# Patient Record
Sex: Male | Born: 1935
Health system: Southern US, Community
[De-identification: ages and names within clinical notes are randomized; demographics above are authoritative.]

## PROBLEM LIST (undated history)

## (undated) DIAGNOSIS — I2510 Atherosclerotic heart disease of native coronary artery without angina pectoris: Secondary | ICD-10-CM

## (undated) DIAGNOSIS — C229 Malignant neoplasm of liver, not specified as primary or secondary: Secondary | ICD-10-CM

## (undated) DIAGNOSIS — Z8546 Personal history of malignant neoplasm of prostate: Secondary | ICD-10-CM

## (undated) DIAGNOSIS — R319 Hematuria, unspecified: Secondary | ICD-10-CM

## (undated) DIAGNOSIS — E119 Type 2 diabetes mellitus without complications: Secondary | ICD-10-CM

## (undated) DIAGNOSIS — E785 Hyperlipidemia, unspecified: Secondary | ICD-10-CM

## (undated) DIAGNOSIS — N189 Chronic kidney disease, unspecified: Secondary | ICD-10-CM

## (undated) DIAGNOSIS — C801 Malignant (primary) neoplasm, unspecified: Secondary | ICD-10-CM

## (undated) DIAGNOSIS — I499 Cardiac arrhythmia, unspecified: Secondary | ICD-10-CM

## (undated) DIAGNOSIS — C61 Malignant neoplasm of prostate: Secondary | ICD-10-CM

## (undated) DIAGNOSIS — Z960 Presence of urogenital implants: Secondary | ICD-10-CM

## (undated) DIAGNOSIS — I10 Essential (primary) hypertension: Secondary | ICD-10-CM

## (undated) DIAGNOSIS — R011 Cardiac murmur, unspecified: Secondary | ICD-10-CM

## (undated) DIAGNOSIS — G4733 Obstructive sleep apnea (adult) (pediatric): Secondary | ICD-10-CM

## (undated) DIAGNOSIS — R55 Syncope and collapse: Secondary | ICD-10-CM

## (undated) DIAGNOSIS — M1990 Unspecified osteoarthritis, unspecified site: Secondary | ICD-10-CM

## (undated) DIAGNOSIS — L0390 Cellulitis, unspecified: Secondary | ICD-10-CM

## (undated) DIAGNOSIS — C8590 Non-Hodgkin lymphoma, unspecified, unspecified site: Secondary | ICD-10-CM

## (undated) DIAGNOSIS — S2249XA Multiple fractures of ribs, unspecified side, initial encounter for closed fracture: Secondary | ICD-10-CM

## (undated) DIAGNOSIS — M109 Gout, unspecified: Secondary | ICD-10-CM

## (undated) DIAGNOSIS — G4730 Sleep apnea, unspecified: Secondary | ICD-10-CM

## (undated) DIAGNOSIS — N200 Calculus of kidney: Secondary | ICD-10-CM

## (undated) DIAGNOSIS — E1140 Type 2 diabetes mellitus with diabetic neuropathy, unspecified: Secondary | ICD-10-CM

## (undated) DIAGNOSIS — W309XXA Contact with unspecified agricultural machinery, initial encounter: Secondary | ICD-10-CM

## (undated) DIAGNOSIS — I4891 Unspecified atrial fibrillation: Secondary | ICD-10-CM

## (undated) DIAGNOSIS — I480 Paroxysmal atrial fibrillation: Secondary | ICD-10-CM

## (undated) DIAGNOSIS — R413 Other amnesia: Secondary | ICD-10-CM

## (undated) DIAGNOSIS — J45909 Unspecified asthma, uncomplicated: Secondary | ICD-10-CM

## (undated) DIAGNOSIS — R0602 Shortness of breath: Secondary | ICD-10-CM

## (undated) DIAGNOSIS — Z87442 Personal history of urinary calculi: Secondary | ICD-10-CM

## (undated) DIAGNOSIS — R7881 Bacteremia: Secondary | ICD-10-CM

## (undated) DIAGNOSIS — D649 Anemia, unspecified: Secondary | ICD-10-CM

## (undated) DIAGNOSIS — H9190 Unspecified hearing loss, unspecified ear: Secondary | ICD-10-CM

## (undated) DIAGNOSIS — C4490 Unspecified malignant neoplasm of skin, unspecified: Secondary | ICD-10-CM

## (undated) DIAGNOSIS — K219 Gastro-esophageal reflux disease without esophagitis: Secondary | ICD-10-CM

## (undated) DIAGNOSIS — G629 Polyneuropathy, unspecified: Secondary | ICD-10-CM

## (undated) HISTORY — DX: Syncope and collapse: R55

## (undated) HISTORY — DX: Unspecified hearing loss, unspecified ear: H91.90

## (undated) HISTORY — DX: Unspecified atrial fibrillation: I48.91

## (undated) HISTORY — DX: Anemia, unspecified: D64.9

## (undated) HISTORY — PX: CYSTOSCOPY W/ URETERAL STENT PLACEMENT: SHX1429

## (undated) HISTORY — DX: Gastro-esophageal reflux disease without esophagitis: K21.9

## (undated) HISTORY — DX: Other amnesia: R41.3

## (undated) HISTORY — PX: CYSTOSCOPY: SUR368

## (undated) HISTORY — DX: Unspecified osteoarthritis, unspecified site: M19.90

## (undated) HISTORY — PX: KIDNEY STONE SURGERY: SHX686

## (undated) HISTORY — PX: PROSTATECTOMY: SHX69

## (undated) HISTORY — PX: TRANSURETHRAL RESECTION OF PROSTATE: SHX73

## (undated) HISTORY — PX: CARDIAC CATHETERIZATION: SHX172

## (undated) HISTORY — PX: ROTATOR CUFF REPAIR: SHX139

## (undated) HISTORY — PX: ANKLE SURGERY: SHX546

## (undated) HISTORY — PX: COLONOSCOPY: SHX174

## (undated) HISTORY — PX: CHOLECYSTECTOMY: SHX55

---
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
## 1996-09-16 HISTORY — PX: CARDIAC CATHETERIZATION: SHX172

## 1999-03-24 ENCOUNTER — Other Ambulatory Visit: Admission: RE | Admit: 1999-03-24 | Discharge: 1999-03-24 | Payer: Self-pay | Admitting: Urology

## 1999-04-26 HISTORY — PX: PROSTATECTOMY: SHX69

## 1999-04-30 ENCOUNTER — Encounter: Payer: Self-pay | Admitting: Urology

## 1999-05-05 ENCOUNTER — Encounter (INDEPENDENT_AMBULATORY_CARE_PROVIDER_SITE_OTHER): Payer: Self-pay | Admitting: Specialist

## 1999-05-05 ENCOUNTER — Inpatient Hospital Stay (HOSPITAL_COMMUNITY): Admission: RE | Admit: 1999-05-05 | Discharge: 1999-05-08 | Payer: Self-pay | Admitting: Urology

## 1999-05-11 ENCOUNTER — Emergency Department (HOSPITAL_COMMUNITY): Admission: EM | Admit: 1999-05-11 | Discharge: 1999-05-11 | Payer: Self-pay | Admitting: Emergency Medicine

## 2000-03-21 ENCOUNTER — Encounter: Payer: Self-pay | Admitting: Internal Medicine

## 2000-03-21 ENCOUNTER — Ambulatory Visit (HOSPITAL_COMMUNITY): Admission: RE | Admit: 2000-03-21 | Discharge: 2000-03-21 | Payer: Self-pay | Admitting: Internal Medicine

## 2002-05-05 ENCOUNTER — Encounter: Admission: RE | Admit: 2002-05-05 | Discharge: 2002-05-05 | Payer: Self-pay | Admitting: Orthopaedic Surgery

## 2002-05-05 ENCOUNTER — Encounter: Payer: Self-pay | Admitting: Orthopaedic Surgery

## 2002-05-28 ENCOUNTER — Encounter: Payer: Self-pay | Admitting: Orthopaedic Surgery

## 2002-05-28 ENCOUNTER — Encounter: Admission: RE | Admit: 2002-05-28 | Discharge: 2002-05-28 | Payer: Self-pay | Admitting: Orthopaedic Surgery

## 2002-06-13 ENCOUNTER — Ambulatory Visit (HOSPITAL_BASED_OUTPATIENT_CLINIC_OR_DEPARTMENT_OTHER): Admission: RE | Admit: 2002-06-13 | Discharge: 2002-06-14 | Payer: Self-pay | Admitting: Orthopaedic Surgery

## 2003-07-29 ENCOUNTER — Emergency Department (HOSPITAL_COMMUNITY): Admission: EM | Admit: 2003-07-29 | Discharge: 2003-07-29 | Payer: Self-pay | Admitting: Emergency Medicine

## 2003-07-31 ENCOUNTER — Ambulatory Visit (HOSPITAL_COMMUNITY): Admission: RE | Admit: 2003-07-31 | Discharge: 2003-07-31 | Payer: Self-pay | Admitting: Internal Medicine

## 2003-08-01 ENCOUNTER — Ambulatory Visit (HOSPITAL_COMMUNITY): Admission: RE | Admit: 2003-08-01 | Discharge: 2003-08-01 | Payer: Self-pay | Admitting: Surgery

## 2003-08-01 ENCOUNTER — Encounter (INDEPENDENT_AMBULATORY_CARE_PROVIDER_SITE_OTHER): Payer: Self-pay | Admitting: *Deleted

## 2003-08-01 ENCOUNTER — Encounter (INDEPENDENT_AMBULATORY_CARE_PROVIDER_SITE_OTHER): Payer: Self-pay | Admitting: Specialist

## 2004-05-18 ENCOUNTER — Ambulatory Visit (HOSPITAL_COMMUNITY): Admission: RE | Admit: 2004-05-18 | Discharge: 2004-05-18 | Payer: Self-pay | Admitting: Gastroenterology

## 2004-06-11 ENCOUNTER — Encounter (INDEPENDENT_AMBULATORY_CARE_PROVIDER_SITE_OTHER): Payer: Self-pay | Admitting: Specialist

## 2004-06-11 ENCOUNTER — Ambulatory Visit (HOSPITAL_COMMUNITY): Admission: RE | Admit: 2004-06-11 | Discharge: 2004-06-11 | Payer: Self-pay | Admitting: Gastroenterology

## 2004-06-28 ENCOUNTER — Ambulatory Visit: Payer: Self-pay | Admitting: Oncology

## 2004-06-30 ENCOUNTER — Ambulatory Visit (HOSPITAL_COMMUNITY): Admission: RE | Admit: 2004-06-30 | Discharge: 2004-06-30 | Payer: Self-pay | Admitting: Oncology

## 2004-07-03 ENCOUNTER — Ambulatory Visit (HOSPITAL_COMMUNITY): Admission: RE | Admit: 2004-07-03 | Discharge: 2004-07-03 | Payer: Self-pay | Admitting: Oncology

## 2004-07-06 ENCOUNTER — Encounter (HOSPITAL_COMMUNITY): Admission: RE | Admit: 2004-07-06 | Discharge: 2004-10-04 | Payer: Self-pay | Admitting: Oncology

## 2004-10-06 ENCOUNTER — Ambulatory Visit (HOSPITAL_COMMUNITY): Admission: RE | Admit: 2004-10-06 | Discharge: 2004-10-06 | Payer: Self-pay | Admitting: Oncology

## 2004-10-08 ENCOUNTER — Ambulatory Visit: Payer: Self-pay | Admitting: Oncology

## 2004-11-23 ENCOUNTER — Ambulatory Visit: Payer: Self-pay | Admitting: Oncology

## 2005-01-11 ENCOUNTER — Ambulatory Visit: Payer: Self-pay | Admitting: Oncology

## 2005-03-08 ENCOUNTER — Ambulatory Visit: Payer: Self-pay | Admitting: Oncology

## 2005-05-09 ENCOUNTER — Ambulatory Visit: Payer: Self-pay | Admitting: Oncology

## 2005-05-10 ENCOUNTER — Ambulatory Visit (HOSPITAL_COMMUNITY): Admission: RE | Admit: 2005-05-10 | Discharge: 2005-05-10 | Payer: Self-pay | Admitting: Oncology

## 2005-06-30 ENCOUNTER — Ambulatory Visit: Payer: Self-pay | Admitting: Oncology

## 2005-07-24 ENCOUNTER — Ambulatory Visit: Payer: Self-pay | Admitting: Oncology

## 2005-07-26 LAB — CBC WITH DIFFERENTIAL/PLATELET
Basophils Absolute: 0.1 10*3/uL (ref 0.0–0.1)
EOS%: 2.5 % (ref 0.0–7.0)
HGB: 11.4 g/dL — ABNORMAL LOW (ref 13.0–17.1)
LYMPH%: 9.9 % — ABNORMAL LOW (ref 14.0–48.0)
MCH: 25.8 pg — ABNORMAL LOW (ref 28.0–33.4)
MCV: 77 fL — ABNORMAL LOW (ref 81.6–98.0)
MONO%: 11.4 % (ref 0.0–13.0)
Platelets: 344 10*3/uL (ref 145–400)
RBC: 4.43 10*6/uL (ref 4.20–5.71)
RDW: 18.6 % — ABNORMAL HIGH (ref 11.2–14.6)

## 2005-07-26 LAB — COMPREHENSIVE METABOLIC PANEL
AST: 24 U/L (ref 0–37)
Albumin: 2.8 g/dL — ABNORMAL LOW (ref 3.5–5.2)
Alkaline Phosphatase: 78 U/L (ref 39–117)
BUN: 13 mg/dL (ref 6–23)
Potassium: 3.5 mEq/L (ref 3.5–5.3)
Total Bilirubin: 0.7 mg/dL (ref 0.3–1.2)

## 2005-08-02 LAB — COMPREHENSIVE METABOLIC PANEL
AST: 29 U/L (ref 0–37)
Albumin: 3.1 g/dL — ABNORMAL LOW (ref 3.5–5.2)
BUN: 13 mg/dL (ref 6–23)
CO2: 27 mEq/L (ref 19–32)
Calcium: 11.1 mg/dL — ABNORMAL HIGH (ref 8.4–10.5)
Chloride: 101 mEq/L (ref 96–112)
Creatinine, Ser: 0.8 mg/dL (ref 0.4–1.5)
Glucose, Bld: 90 mg/dL (ref 70–99)
Potassium: 3.9 mEq/L (ref 3.5–5.3)

## 2005-08-02 LAB — CBC WITH DIFFERENTIAL/PLATELET
Basophils Absolute: 0 10*3/uL (ref 0.0–0.1)
EOS%: 1.6 % (ref 0.0–7.0)
HCT: 34 % — ABNORMAL LOW (ref 38.7–49.9)
HGB: 11.2 g/dL — ABNORMAL LOW (ref 13.0–17.1)
MONO#: 0.7 10*3/uL (ref 0.1–0.9)
NEUT#: 10.3 10*3/uL — ABNORMAL HIGH (ref 1.5–6.5)
NEUT%: 85.8 % — ABNORMAL HIGH (ref 40.0–75.0)
RDW: 18.7 % — ABNORMAL HIGH (ref 11.2–14.6)
WBC: 11.9 10*3/uL — ABNORMAL HIGH (ref 4.0–10.0)
lymph#: 0.8 10*3/uL — ABNORMAL LOW (ref 0.9–3.3)

## 2005-08-03 LAB — CBC WITH DIFFERENTIAL/PLATELET
Basophils Absolute: 0.1 10*3/uL (ref 0.0–0.1)
Eosinophils Absolute: 0.3 10*3/uL (ref 0.0–0.5)
HCT: 33.1 % — ABNORMAL LOW (ref 38.7–49.9)
HGB: 11.5 g/dL — ABNORMAL LOW (ref 13.0–17.1)
MCH: 26.4 pg — ABNORMAL LOW (ref 28.0–33.4)
NEUT#: 8.4 10*3/uL — ABNORMAL HIGH (ref 1.5–6.5)
NEUT%: 81.2 % — ABNORMAL HIGH (ref 40.0–75.0)
RDW: 16 % — ABNORMAL HIGH (ref 11.2–14.6)
lymph#: 0.8 10*3/uL — ABNORMAL LOW (ref 0.9–3.3)

## 2005-08-10 LAB — CBC WITH DIFFERENTIAL/PLATELET
Basophils Absolute: 0.1 10*3/uL (ref 0.0–0.1)
EOS%: 3.7 % (ref 0.0–7.0)
Eosinophils Absolute: 0.4 10*3/uL (ref 0.0–0.5)
HCT: 36.6 % — ABNORMAL LOW (ref 38.7–49.9)
HGB: 12.2 g/dL — ABNORMAL LOW (ref 13.0–17.1)
LYMPH%: 8.7 % — ABNORMAL LOW (ref 14.0–48.0)
MCH: 25.9 pg — ABNORMAL LOW (ref 28.0–33.4)
MCV: 77.9 fL — ABNORMAL LOW (ref 81.6–98.0)
MONO%: 10.1 % (ref 0.0–13.0)
NEUT#: 7.5 10*3/uL — ABNORMAL HIGH (ref 1.5–6.5)
NEUT%: 77 % — ABNORMAL HIGH (ref 40.0–75.0)
Platelets: 346 10*3/uL (ref 145–400)

## 2005-08-17 LAB — COMPREHENSIVE METABOLIC PANEL
Albumin: 3.4 g/dL — ABNORMAL LOW (ref 3.5–5.2)
BUN: 25 mg/dL — ABNORMAL HIGH (ref 6–23)
CO2: 26 mEq/L (ref 19–32)
Glucose, Bld: 173 mg/dL — ABNORMAL HIGH (ref 70–99)
Potassium: 3.7 mEq/L (ref 3.5–5.3)
Sodium: 136 mEq/L (ref 135–145)
Total Bilirubin: 0.6 mg/dL (ref 0.3–1.2)
Total Protein: 7.2 g/dL (ref 6.0–8.3)

## 2005-08-17 LAB — CBC WITH DIFFERENTIAL/PLATELET
EOS%: 3.6 % (ref 0.0–7.0)
Eosinophils Absolute: 0.3 10*3/uL (ref 0.0–0.5)
LYMPH%: 10.1 % — ABNORMAL LOW (ref 14.0–48.0)
MCH: 25.7 pg — ABNORMAL LOW (ref 28.0–33.4)
MCHC: 33.2 g/dL (ref 32.0–35.9)
MCV: 77.5 fL — ABNORMAL LOW (ref 81.6–98.0)
MONO%: 10.3 % (ref 0.0–13.0)
NEUT#: 6.4 10*3/uL (ref 1.5–6.5)
Platelets: 334 10*3/uL (ref 145–400)
RBC: 4.64 10*6/uL (ref 4.20–5.71)

## 2005-08-19 LAB — IMMUNOFIXATION ELECTROPHORESIS
IgG (Immunoglobin G), Serum: 1060 mg/dL (ref 694–1618)
Total Protein, Serum Electrophoresis: 7.3 g/dL (ref 6.0–8.3)

## 2005-08-19 LAB — BETA 2 MICROGLOBULIN, SERUM: Beta-2 Microglobulin: 3.15 mg/L — ABNORMAL HIGH (ref 1.01–1.73)

## 2005-08-23 LAB — CALCITONIN: Calcitonin: 2.2 pg/mL (ref 0.0–11.5)

## 2005-08-24 LAB — CBC WITH DIFFERENTIAL/PLATELET
Basophils Absolute: 0 10*3/uL (ref 0.0–0.1)
EOS%: 3.7 % (ref 0.0–7.0)
Eosinophils Absolute: 0.3 10*3/uL (ref 0.0–0.5)
HCT: 32.8 % — ABNORMAL LOW (ref 38.7–49.9)
HGB: 11.4 g/dL — ABNORMAL LOW (ref 13.0–17.1)
MCH: 26.3 pg — ABNORMAL LOW (ref 28.0–33.4)
MCV: 75.6 fL — ABNORMAL LOW (ref 81.6–98.0)
MONO%: 10.8 % (ref 0.0–13.0)
NEUT%: 77.7 % — ABNORMAL HIGH (ref 40.0–75.0)

## 2005-10-03 ENCOUNTER — Ambulatory Visit: Payer: Self-pay | Admitting: Oncology

## 2005-10-03 LAB — COMPREHENSIVE METABOLIC PANEL
Albumin: 2.9 g/dL — ABNORMAL LOW (ref 3.5–5.2)
Alkaline Phosphatase: 81 U/L (ref 39–117)
BUN: 15 mg/dL (ref 6–23)
CO2: 29 mEq/L (ref 19–32)
Glucose, Bld: 135 mg/dL — ABNORMAL HIGH (ref 70–99)
Potassium: 4.2 mEq/L (ref 3.5–5.3)
Sodium: 135 mEq/L (ref 135–145)
Total Protein: 7.1 g/dL (ref 6.0–8.3)

## 2005-10-03 LAB — CBC WITH DIFFERENTIAL/PLATELET
Basophils Absolute: 0 10*3/uL (ref 0.0–0.1)
Eosinophils Absolute: 0.2 10*3/uL (ref 0.0–0.5)
HGB: 11.1 g/dL — ABNORMAL LOW (ref 13.0–17.1)
LYMPH%: 5.5 % — ABNORMAL LOW (ref 14.0–48.0)
MCV: 76.3 fL — ABNORMAL LOW (ref 81.6–98.0)
MONO#: 0.8 10*3/uL (ref 0.1–0.9)
MONO%: 8.2 % (ref 0.0–13.0)
NEUT#: 8.7 10*3/uL — ABNORMAL HIGH (ref 1.5–6.5)
Platelets: 433 10*3/uL — ABNORMAL HIGH (ref 145–400)
WBC: 10.4 10*3/uL — ABNORMAL HIGH (ref 4.0–10.0)

## 2005-10-05 LAB — CBC WITH DIFFERENTIAL/PLATELET
Basophils Absolute: 0 10*3/uL (ref 0.0–0.1)
Eosinophils Absolute: 0.1 10*3/uL (ref 0.0–0.5)
HGB: 10.9 g/dL — ABNORMAL LOW (ref 13.0–17.1)
MCV: 77.7 fL — ABNORMAL LOW (ref 81.6–98.0)
MONO#: 0.3 10*3/uL (ref 0.1–0.9)
NEUT#: 9 10*3/uL — ABNORMAL HIGH (ref 1.5–6.5)
RBC: 4.41 10*6/uL (ref 4.20–5.71)
RDW: 15 % — ABNORMAL HIGH (ref 11.2–14.6)
WBC: 9.9 10*3/uL (ref 4.0–10.0)
lymph#: 0.5 10*3/uL — ABNORMAL LOW (ref 0.9–3.3)

## 2005-10-13 ENCOUNTER — Emergency Department (HOSPITAL_COMMUNITY): Admission: EM | Admit: 2005-10-13 | Discharge: 2005-10-13 | Payer: Self-pay | Admitting: Emergency Medicine

## 2005-10-19 LAB — CBC WITH DIFFERENTIAL/PLATELET
Basophils Absolute: 0.1 10*3/uL (ref 0.0–0.1)
Eosinophils Absolute: 0 10*3/uL (ref 0.0–0.5)
HCT: 31.7 % — ABNORMAL LOW (ref 38.7–49.9)
HGB: 10.8 g/dL — ABNORMAL LOW (ref 13.0–17.1)
MONO#: 0.5 10*3/uL (ref 0.1–0.9)
NEUT#: 7.2 10*3/uL — ABNORMAL HIGH (ref 1.5–6.5)
NEUT%: 86 % — ABNORMAL HIGH (ref 40.0–75.0)
WBC: 8.4 10*3/uL (ref 4.0–10.0)
lymph#: 0.5 10*3/uL — ABNORMAL LOW (ref 0.9–3.3)

## 2005-10-25 LAB — CBC WITH DIFFERENTIAL/PLATELET
Basophils Absolute: 0.1 10*3/uL (ref 0.0–0.1)
EOS%: 1.2 % (ref 0.0–7.0)
HCT: 35.3 % — ABNORMAL LOW (ref 38.7–49.9)
HGB: 12 g/dL — ABNORMAL LOW (ref 13.0–17.1)
MCH: 26.8 pg — ABNORMAL LOW (ref 28.0–33.4)
MCV: 79.1 fL — ABNORMAL LOW (ref 81.6–98.0)
MONO%: 8.6 % (ref 0.0–13.0)
NEUT%: 83.4 % — ABNORMAL HIGH (ref 40.0–75.0)
Platelets: 297 10*3/uL (ref 145–400)

## 2005-10-27 LAB — COMPREHENSIVE METABOLIC PANEL
AST: 41 U/L — ABNORMAL HIGH (ref 0–37)
BUN: 14 mg/dL (ref 6–23)
Calcium: 8.8 mg/dL (ref 8.4–10.5)
Chloride: 106 mEq/L (ref 96–112)
Creatinine, Ser: 0.73 mg/dL (ref 0.40–1.50)

## 2005-11-02 LAB — CBC WITH DIFFERENTIAL/PLATELET
Basophils Absolute: 0 10*3/uL (ref 0.0–0.1)
Eosinophils Absolute: 0.1 10*3/uL (ref 0.0–0.5)
HGB: 10.5 g/dL — ABNORMAL LOW (ref 13.0–17.1)
MCV: 79.7 fL — ABNORMAL LOW (ref 81.6–98.0)
MONO#: 0 10*3/uL — ABNORMAL LOW (ref 0.1–0.9)
MONO%: 1 % (ref 0.0–13.0)
NEUT#: 3.9 10*3/uL (ref 1.5–6.5)
RDW: 22.5 % — ABNORMAL HIGH (ref 11.2–14.6)
lymph#: 0.3 10*3/uL — ABNORMAL LOW (ref 0.9–3.3)

## 2005-11-08 LAB — CBC WITH DIFFERENTIAL/PLATELET
Basophils Absolute: 0 10*3/uL (ref 0.0–0.1)
Eosinophils Absolute: 0.1 10*3/uL (ref 0.0–0.5)
HCT: 33.8 % — ABNORMAL LOW (ref 38.7–49.9)
HGB: 11.3 g/dL — ABNORMAL LOW (ref 13.0–17.1)
LYMPH%: 4.6 % — ABNORMAL LOW (ref 14.0–48.0)
MCV: 81.2 fL — ABNORMAL LOW (ref 81.6–98.0)
MONO%: 5.1 % (ref 0.0–13.0)
NEUT#: 8.4 10*3/uL — ABNORMAL HIGH (ref 1.5–6.5)
NEUT%: 88.8 % — ABNORMAL HIGH (ref 40.0–75.0)
Platelets: 156 10*3/uL (ref 145–400)

## 2005-11-09 LAB — COMPREHENSIVE METABOLIC PANEL
Alkaline Phosphatase: 68 U/L (ref 39–117)
BUN: 9 mg/dL (ref 6–23)
CO2: 24 mEq/L (ref 19–32)
Glucose, Bld: 124 mg/dL — ABNORMAL HIGH (ref 70–99)
Total Bilirubin: 0.5 mg/dL (ref 0.3–1.2)

## 2005-11-09 LAB — BETA 2 MICROGLOBULIN, SERUM: Beta-2 Microglobulin: 2.47 mg/L — ABNORMAL HIGH (ref 1.01–1.73)

## 2005-11-11 ENCOUNTER — Ambulatory Visit: Payer: Self-pay | Admitting: Oncology

## 2005-11-16 LAB — CBC WITH DIFFERENTIAL/PLATELET
Basophils Absolute: 0.1 10*3/uL (ref 0.0–0.1)
Eosinophils Absolute: 0 10*3/uL (ref 0.0–0.5)
HGB: 11.4 g/dL — ABNORMAL LOW (ref 13.0–17.1)
MONO#: 0.7 10*3/uL (ref 0.1–0.9)
NEUT#: 5 10*3/uL (ref 1.5–6.5)
RBC: 4.16 10*6/uL — ABNORMAL LOW (ref 4.20–5.71)
RDW: 18.9 % — ABNORMAL HIGH (ref 11.2–14.6)
WBC: 6.3 10*3/uL (ref 4.0–10.0)
lymph#: 0.5 10*3/uL — ABNORMAL LOW (ref 0.9–3.3)

## 2005-11-23 ENCOUNTER — Emergency Department (HOSPITAL_COMMUNITY): Admission: EM | Admit: 2005-11-23 | Discharge: 2005-11-23 | Payer: Self-pay | Admitting: Emergency Medicine

## 2005-11-23 LAB — CBC WITH DIFFERENTIAL/PLATELET
BASO%: 0.3 % (ref 0.0–2.0)
HCT: 32.1 % — ABNORMAL LOW (ref 38.7–49.9)
LYMPH%: 9 % — ABNORMAL LOW (ref 14.0–48.0)
MCHC: 34 g/dL (ref 32.0–35.9)
MCV: 81.2 fL — ABNORMAL LOW (ref 81.6–98.0)
MONO#: 0.2 10*3/uL (ref 0.1–0.9)
MONO%: 8 % (ref 0.0–13.0)
NEUT%: 80.6 % — ABNORMAL HIGH (ref 40.0–75.0)
Platelets: 190 10*3/uL (ref 145–400)
WBC: 2.6 10*3/uL — ABNORMAL LOW (ref 4.0–10.0)

## 2005-12-07 LAB — CBC WITH DIFFERENTIAL/PLATELET
BASO%: 1.5 % (ref 0.0–2.0)
Basophils Absolute: 0.1 10*3/uL (ref 0.0–0.1)
Eosinophils Absolute: 0 10*3/uL (ref 0.0–0.5)
HCT: 34.2 % — ABNORMAL LOW (ref 38.7–49.9)
HGB: 11.7 g/dL — ABNORMAL LOW (ref 13.0–17.1)
LYMPH%: 6.3 % — ABNORMAL LOW (ref 14.0–48.0)
MCH: 28.5 pg (ref 28.0–33.4)
MONO%: 10.3 % (ref 0.0–13.0)
NEUT%: 81.4 % — ABNORMAL HIGH (ref 40.0–75.0)
lymph#: 0.5 10*3/uL — ABNORMAL LOW (ref 0.9–3.3)

## 2005-12-14 LAB — CBC WITH DIFFERENTIAL/PLATELET
BASO%: 0.1 % (ref 0.0–2.0)
Eosinophils Absolute: 0 10*3/uL (ref 0.0–0.5)
MCHC: 34.2 g/dL (ref 32.0–35.9)
MONO#: 0.1 10*3/uL (ref 0.1–0.9)
NEUT#: 4.1 10*3/uL (ref 1.5–6.5)
RBC: 3.71 10*6/uL — ABNORMAL LOW (ref 4.20–5.71)
WBC: 4.3 10*3/uL (ref 4.0–10.0)
lymph#: 0.1 10*3/uL — ABNORMAL LOW (ref 0.9–3.3)

## 2005-12-23 ENCOUNTER — Ambulatory Visit: Payer: Self-pay | Admitting: Oncology

## 2005-12-28 LAB — CBC WITH DIFFERENTIAL/PLATELET
BASO%: 0.9 % (ref 0.0–2.0)
Basophils Absolute: 0.1 10*3/uL (ref 0.0–0.1)
Eosinophils Absolute: 0 10*3/uL (ref 0.0–0.5)
HCT: 32.6 % — ABNORMAL LOW (ref 38.7–49.9)
HGB: 11.4 g/dL — ABNORMAL LOW (ref 13.0–17.1)
MONO#: 0.7 10*3/uL (ref 0.1–0.9)
NEUT#: 5.8 10*3/uL (ref 1.5–6.5)
NEUT%: 82.5 % — ABNORMAL HIGH (ref 40.0–75.0)
WBC: 7.1 10*3/uL (ref 4.0–10.0)
lymph#: 0.4 10*3/uL — ABNORMAL LOW (ref 0.9–3.3)

## 2006-01-04 LAB — CBC WITH DIFFERENTIAL/PLATELET
Basophils Absolute: 0 10*3/uL (ref 0.0–0.1)
EOS%: 2.6 % (ref 0.0–7.0)
HCT: 29.4 % — ABNORMAL LOW (ref 38.7–49.9)
HGB: 10.2 g/dL — ABNORMAL LOW (ref 13.0–17.1)
MCH: 29.6 pg (ref 28.0–33.4)
MCV: 85.2 fL (ref 81.6–98.0)
NEUT%: 81.8 % — ABNORMAL HIGH (ref 40.0–75.0)
Platelets: 150 10*3/uL (ref 145–400)
lymph#: 0.2 10*3/uL — ABNORMAL LOW (ref 0.9–3.3)

## 2006-01-18 LAB — CBC WITH DIFFERENTIAL/PLATELET
Basophils Absolute: 0.1 10*3/uL (ref 0.0–0.1)
EOS%: 0.6 % (ref 0.0–7.0)
HCT: 27.8 % — ABNORMAL LOW (ref 38.7–49.9)
HGB: 9.8 g/dL — ABNORMAL LOW (ref 13.0–17.1)
MCH: 29.6 pg (ref 28.0–33.4)
MCV: 84.5 fL (ref 81.6–98.0)
MONO%: 10.1 % (ref 0.0–13.0)
NEUT%: 83.2 % — ABNORMAL HIGH (ref 40.0–75.0)
RDW: 14.7 % — ABNORMAL HIGH (ref 11.2–14.6)

## 2006-01-26 ENCOUNTER — Ambulatory Visit (HOSPITAL_COMMUNITY): Admission: RE | Admit: 2006-01-26 | Discharge: 2006-01-26 | Payer: Self-pay | Admitting: Oncology

## 2006-03-15 ENCOUNTER — Ambulatory Visit: Payer: Self-pay | Admitting: Oncology

## 2006-03-20 ENCOUNTER — Ambulatory Visit (HOSPITAL_COMMUNITY): Admission: RE | Admit: 2006-03-20 | Discharge: 2006-03-20 | Payer: Self-pay | Admitting: Oncology

## 2006-03-20 LAB — CBC WITH DIFFERENTIAL/PLATELET
BASO%: 0.2 % (ref 0.0–2.0)
EOS%: 4.4 % (ref 0.0–7.0)
MCH: 29.8 pg (ref 28.0–33.4)
MCHC: 34.2 g/dL (ref 32.0–35.9)
MONO#: 0.3 10*3/uL (ref 0.1–0.9)
RDW: 15 % — ABNORMAL HIGH (ref 11.2–14.6)
WBC: 3.3 10*3/uL — ABNORMAL LOW (ref 4.0–10.0)
lymph#: 0.4 10*3/uL — ABNORMAL LOW (ref 0.9–3.3)

## 2006-03-21 LAB — COMPREHENSIVE METABOLIC PANEL
ALT: 48 U/L (ref 0–53)
AST: 30 U/L (ref 0–37)
Albumin: 4.2 g/dL (ref 3.5–5.2)
CO2: 26 mEq/L (ref 19–32)
Calcium: 9.7 mg/dL (ref 8.4–10.5)
Chloride: 103 mEq/L (ref 96–112)
Potassium: 4.3 mEq/L (ref 3.5–5.3)
Sodium: 139 mEq/L (ref 135–145)
Total Protein: 6.4 g/dL (ref 6.0–8.3)

## 2006-05-08 ENCOUNTER — Ambulatory Visit: Payer: Self-pay | Admitting: Oncology

## 2006-06-23 ENCOUNTER — Inpatient Hospital Stay (HOSPITAL_COMMUNITY): Admission: EM | Admit: 2006-06-23 | Discharge: 2006-06-26 | Payer: Self-pay | Admitting: Emergency Medicine

## 2006-06-28 ENCOUNTER — Inpatient Hospital Stay (HOSPITAL_COMMUNITY): Admission: AD | Admit: 2006-06-28 | Discharge: 2006-07-04 | Payer: Self-pay | Admitting: Orthopedic Surgery

## 2006-06-28 DIAGNOSIS — L02419 Cutaneous abscess of limb, unspecified: Secondary | ICD-10-CM

## 2006-06-28 DIAGNOSIS — R7881 Bacteremia: Secondary | ICD-10-CM | POA: Insufficient documentation

## 2006-06-28 DIAGNOSIS — L03119 Cellulitis of unspecified part of limb: Secondary | ICD-10-CM | POA: Insufficient documentation

## 2006-06-30 ENCOUNTER — Ambulatory Visit: Payer: Self-pay | Admitting: Infectious Diseases

## 2006-07-03 ENCOUNTER — Ambulatory Visit: Payer: Self-pay | Admitting: Oncology

## 2006-07-04 ENCOUNTER — Ambulatory Visit: Payer: Self-pay | Admitting: Vascular Surgery

## 2006-07-07 ENCOUNTER — Encounter: Payer: Self-pay | Admitting: Infectious Diseases

## 2006-07-11 ENCOUNTER — Telehealth: Payer: Self-pay | Admitting: Infectious Diseases

## 2006-07-28 ENCOUNTER — Ambulatory Visit: Payer: Self-pay | Admitting: Infectious Diseases

## 2006-07-28 DIAGNOSIS — E119 Type 2 diabetes mellitus without complications: Secondary | ICD-10-CM

## 2006-07-28 DIAGNOSIS — K8021 Calculus of gallbladder without cholecystitis with obstruction: Secondary | ICD-10-CM | POA: Insufficient documentation

## 2006-07-28 DIAGNOSIS — E782 Mixed hyperlipidemia: Secondary | ICD-10-CM

## 2006-07-28 DIAGNOSIS — Z8739 Personal history of other diseases of the musculoskeletal system and connective tissue: Secondary | ICD-10-CM

## 2006-07-28 DIAGNOSIS — Z87898 Personal history of other specified conditions: Secondary | ICD-10-CM

## 2006-07-28 DIAGNOSIS — T8140XA Infection following a procedure, unspecified, initial encounter: Secondary | ICD-10-CM

## 2006-07-28 DIAGNOSIS — Z8546 Personal history of malignant neoplasm of prostate: Secondary | ICD-10-CM

## 2006-07-28 DIAGNOSIS — K219 Gastro-esophageal reflux disease without esophagitis: Secondary | ICD-10-CM

## 2006-07-28 DIAGNOSIS — Z87442 Personal history of urinary calculi: Secondary | ICD-10-CM | POA: Insufficient documentation

## 2006-07-28 HISTORY — DX: Personal history of malignant neoplasm of prostate: Z85.46

## 2006-07-28 LAB — CONVERTED CEMR LAB: Blood Glucose, Fingerstick: 130

## 2006-07-31 ENCOUNTER — Encounter: Payer: Self-pay | Admitting: Infectious Diseases

## 2006-08-01 ENCOUNTER — Encounter: Payer: Self-pay | Admitting: Infectious Diseases

## 2006-08-06 ENCOUNTER — Encounter: Payer: Self-pay | Admitting: Infectious Diseases

## 2006-08-06 ENCOUNTER — Encounter: Admission: RE | Admit: 2006-08-06 | Discharge: 2006-08-06 | Payer: Self-pay | Admitting: Internal Medicine

## 2006-08-07 ENCOUNTER — Encounter: Payer: Self-pay | Admitting: Infectious Diseases

## 2006-08-11 ENCOUNTER — Ambulatory Visit: Payer: Self-pay | Admitting: Infectious Diseases

## 2006-08-11 LAB — CONVERTED CEMR LAB
ALT: 22 units/L (ref 0–53)
AST: 21 units/L (ref 0–37)
Basophils Absolute: 0 10*3/uL (ref 0.0–0.1)
Calcium: 9.4 mg/dL (ref 8.4–10.5)
Chloride: 101 meq/L (ref 96–112)
Creatinine, Ser: 0.94 mg/dL (ref 0.40–1.50)
Eosinophils Relative: 5 % (ref 0–5)
Lymphocytes Relative: 13 % (ref 12–46)
Neutro Abs: 4.2 10*3/uL (ref 1.7–7.7)
Neutrophils Relative %: 72 % (ref 43–77)
Platelets: 148 10*3/uL — ABNORMAL LOW (ref 150–400)
Potassium: 4.1 meq/L (ref 3.5–5.3)
RDW: 15.4 % — ABNORMAL HIGH (ref 11.5–14.0)
Sed Rate: 16 mm/hr (ref 0–16)
Sodium: 139 meq/L (ref 135–145)

## 2006-08-14 ENCOUNTER — Telehealth: Payer: Self-pay | Admitting: Infectious Diseases

## 2006-08-16 ENCOUNTER — Encounter: Payer: Self-pay | Admitting: Infectious Diseases

## 2006-08-30 ENCOUNTER — Ambulatory Visit: Payer: Self-pay | Admitting: Infectious Diseases

## 2006-08-31 ENCOUNTER — Encounter: Admission: RE | Admit: 2006-08-31 | Discharge: 2006-11-29 | Payer: Self-pay | Admitting: Orthopedic Surgery

## 2006-09-04 ENCOUNTER — Ambulatory Visit: Payer: Self-pay | Admitting: Oncology

## 2006-09-06 LAB — CBC WITH DIFFERENTIAL/PLATELET
Basophils Absolute: 0 10*3/uL (ref 0.0–0.1)
EOS%: 5.3 % (ref 0.0–7.0)
HCT: 34.8 % — ABNORMAL LOW (ref 38.7–49.9)
HGB: 12.4 g/dL — ABNORMAL LOW (ref 13.0–17.1)
LYMPH%: 12.2 % — ABNORMAL LOW (ref 14.0–48.0)
MCH: 29.9 pg (ref 28.0–33.4)
MCV: 84.1 fL (ref 81.6–98.0)
MONO%: 9.4 % (ref 0.0–13.0)
NEUT%: 72.9 % (ref 40.0–75.0)

## 2006-09-06 LAB — IGG, IGA, IGM
IgG (Immunoglobin G), Serum: 715 mg/dL (ref 694–1618)
IgM, Serum: 44 mg/dL — ABNORMAL LOW (ref 60–263)

## 2006-09-06 LAB — COMPREHENSIVE METABOLIC PANEL
AST: 22 U/L (ref 0–37)
Alkaline Phosphatase: 70 U/L (ref 39–117)
BUN: 13 mg/dL (ref 6–23)
Calcium: 9.5 mg/dL (ref 8.4–10.5)
Chloride: 99 mEq/L (ref 96–112)
Creatinine, Ser: 0.76 mg/dL (ref 0.40–1.50)
Total Bilirubin: 0.3 mg/dL (ref 0.3–1.2)

## 2006-09-06 LAB — LACTATE DEHYDROGENASE: LDH: 139 U/L (ref 94–250)

## 2006-09-06 LAB — BETA 2 MICROGLOBULIN, SERUM: Beta-2 Microglobulin: 2.16 mg/L — ABNORMAL HIGH (ref 1.01–1.73)

## 2006-12-10 ENCOUNTER — Ambulatory Visit: Payer: Self-pay | Admitting: Oncology

## 2006-12-14 LAB — CBC WITH DIFFERENTIAL/PLATELET
BASO%: 0.1 % (ref 0.0–2.0)
EOS%: 3.6 % (ref 0.0–7.0)
HCT: 36.5 % — ABNORMAL LOW (ref 38.7–49.9)
MCH: 30.5 pg (ref 28.0–33.4)
MCHC: 35.8 g/dL (ref 32.0–35.9)
MONO%: 9.4 % (ref 0.0–13.0)
NEUT%: 74.1 % (ref 40.0–75.0)
lymph#: 0.7 10*3/uL — ABNORMAL LOW (ref 0.9–3.3)

## 2006-12-14 LAB — COMPREHENSIVE METABOLIC PANEL
ALT: 51 U/L (ref 0–53)
AST: 39 U/L — ABNORMAL HIGH (ref 0–37)
Alkaline Phosphatase: 55 U/L (ref 39–117)
Calcium: 9.8 mg/dL (ref 8.4–10.5)
Chloride: 103 mEq/L (ref 96–112)
Creatinine, Ser: 0.54 mg/dL (ref 0.40–1.50)
Total Bilirubin: 0.6 mg/dL (ref 0.3–1.2)

## 2006-12-14 LAB — LACTATE DEHYDROGENASE: LDH: 127 U/L (ref 94–250)

## 2006-12-15 LAB — BETA 2 MICROGLOBULIN, SERUM: Beta-2 Microglobulin: 2.34 mg/L — ABNORMAL HIGH (ref 1.01–1.73)

## 2006-12-15 LAB — IGG, IGA, IGM
IgA: 125 mg/dL (ref 68–378)
IgM, Serum: 47 mg/dL — ABNORMAL LOW (ref 60–263)

## 2007-01-30 ENCOUNTER — Ambulatory Visit: Payer: Self-pay | Admitting: Oncology

## 2007-03-15 ENCOUNTER — Ambulatory Visit: Payer: Self-pay | Admitting: Oncology

## 2007-03-16 ENCOUNTER — Ambulatory Visit (HOSPITAL_COMMUNITY): Admission: RE | Admit: 2007-03-16 | Discharge: 2007-03-16 | Payer: Self-pay | Admitting: Family Medicine

## 2007-03-19 LAB — CBC WITH DIFFERENTIAL/PLATELET
BASO%: 0 % (ref 0.0–2.0)
EOS%: 4.5 % (ref 0.0–7.0)
MCH: 30.7 pg (ref 28.0–33.4)
MCHC: 35.7 g/dL (ref 32.0–35.9)
RDW: 15 % — ABNORMAL HIGH (ref 11.2–14.6)
lymph#: 0.5 10*3/uL — ABNORMAL LOW (ref 0.9–3.3)

## 2007-03-21 LAB — COMPREHENSIVE METABOLIC PANEL
ALT: 49 U/L (ref 0–53)
AST: 37 U/L (ref 0–37)
Albumin: 4.7 g/dL (ref 3.5–5.2)
Calcium: 9.6 mg/dL (ref 8.4–10.5)
Chloride: 103 mEq/L (ref 96–112)
Creatinine, Ser: 0.88 mg/dL (ref 0.40–1.50)
Potassium: 4.1 mEq/L (ref 3.5–5.3)

## 2007-03-21 LAB — IMMUNOFIXATION ELECTROPHORESIS
IgG (Immunoglobin G), Serum: 727 mg/dL (ref 694–1618)
Total Protein, Serum Electrophoresis: 6.9 g/dL (ref 6.0–8.3)

## 2007-09-05 ENCOUNTER — Ambulatory Visit (HOSPITAL_COMMUNITY): Admission: RE | Admit: 2007-09-05 | Discharge: 2007-09-05 | Payer: Self-pay | Admitting: Oncology

## 2007-09-10 ENCOUNTER — Ambulatory Visit: Payer: Self-pay | Admitting: Oncology

## 2007-11-25 ENCOUNTER — Emergency Department (HOSPITAL_COMMUNITY): Admission: EM | Admit: 2007-11-25 | Discharge: 2007-11-25 | Payer: Self-pay | Admitting: Emergency Medicine

## 2008-03-10 ENCOUNTER — Ambulatory Visit: Payer: Self-pay | Admitting: Oncology

## 2008-03-12 LAB — CBC WITH DIFFERENTIAL/PLATELET
Eosinophils Absolute: 0.3 10*3/uL (ref 0.0–0.5)
HCT: 38.1 % — ABNORMAL LOW (ref 38.7–49.9)
LYMPH%: 8.6 % — ABNORMAL LOW (ref 14.0–48.0)
MONO#: 0.5 10*3/uL (ref 0.1–0.9)
NEUT#: 5.1 10*3/uL (ref 1.5–6.5)
NEUT%: 78.5 % — ABNORMAL HIGH (ref 40.0–75.0)
Platelets: 171 10*3/uL (ref 145–400)
WBC: 6.5 10*3/uL (ref 4.0–10.0)

## 2008-03-13 LAB — LACTATE DEHYDROGENASE: LDH: 124 U/L (ref 94–250)

## 2008-03-13 LAB — COMPREHENSIVE METABOLIC PANEL
BUN: 21 mg/dL (ref 6–23)
CO2: 22 mEq/L (ref 19–32)
Calcium: 9.6 mg/dL (ref 8.4–10.5)
Chloride: 102 mEq/L (ref 96–112)
Creatinine, Ser: 0.79 mg/dL (ref 0.40–1.50)
Glucose, Bld: 166 mg/dL — ABNORMAL HIGH (ref 70–99)

## 2008-09-08 ENCOUNTER — Ambulatory Visit: Payer: Self-pay | Admitting: Oncology

## 2008-09-10 ENCOUNTER — Ambulatory Visit (HOSPITAL_COMMUNITY): Admission: RE | Admit: 2008-09-10 | Discharge: 2008-09-10 | Payer: Self-pay | Admitting: Oncology

## 2008-09-10 LAB — CBC WITH DIFFERENTIAL/PLATELET
BASO%: 0.2 % (ref 0.0–2.0)
EOS%: 5.6 % (ref 0.0–7.0)
HCT: 37 % — ABNORMAL LOW (ref 38.4–49.9)
LYMPH%: 9 % — ABNORMAL LOW (ref 14.0–49.0)
MCH: 29.6 pg (ref 27.2–33.4)
MCHC: 33.8 g/dL (ref 32.0–36.0)
MCV: 87.6 fL (ref 79.3–98.0)
MONO%: 8.8 % (ref 0.0–14.0)
NEUT%: 76.4 % — ABNORMAL HIGH (ref 39.0–75.0)
Platelets: 165 10*3/uL (ref 140–400)

## 2008-09-11 LAB — COMPREHENSIVE METABOLIC PANEL
ALT: 30 U/L (ref 0–53)
AST: 24 U/L (ref 0–37)
Alkaline Phosphatase: 41 U/L (ref 39–117)
CO2: 25 mEq/L (ref 19–32)
Creatinine, Ser: 1.01 mg/dL (ref 0.40–1.50)
Total Bilirubin: 0.3 mg/dL (ref 0.3–1.2)

## 2008-09-11 LAB — LACTATE DEHYDROGENASE: LDH: 123 U/L (ref 94–250)

## 2009-02-27 ENCOUNTER — Ambulatory Visit: Payer: Self-pay | Admitting: Oncology

## 2009-03-03 LAB — CBC WITH DIFFERENTIAL/PLATELET
Basophils Absolute: 0 10*3/uL (ref 0.0–0.1)
EOS%: 3.7 % (ref 0.0–7.0)
Eosinophils Absolute: 0.2 10*3/uL (ref 0.0–0.5)
HCT: 35.4 % — ABNORMAL LOW (ref 38.4–49.9)
HGB: 12.1 g/dL — ABNORMAL LOW (ref 13.0–17.1)
MONO#: 0.5 10*3/uL (ref 0.1–0.9)
NEUT#: 2.9 10*3/uL (ref 1.5–6.5)
NEUT%: 67.4 % (ref 39.0–75.0)
RDW: 14.6 % (ref 11.0–14.6)
lymph#: 0.8 10*3/uL — ABNORMAL LOW (ref 0.9–3.3)

## 2009-03-04 LAB — COMPREHENSIVE METABOLIC PANEL
AST: 38 U/L — ABNORMAL HIGH (ref 0–37)
Albumin: 4.5 g/dL (ref 3.5–5.2)
BUN: 23 mg/dL (ref 6–23)
CO2: 22 mEq/L (ref 19–32)
Calcium: 10.2 mg/dL (ref 8.4–10.5)
Chloride: 103 mEq/L (ref 96–112)
Creatinine, Ser: 1.13 mg/dL (ref 0.40–1.50)
Glucose, Bld: 186 mg/dL — ABNORMAL HIGH (ref 70–99)
Potassium: 4 mEq/L (ref 3.5–5.3)

## 2009-04-01 ENCOUNTER — Emergency Department (HOSPITAL_COMMUNITY): Admission: EM | Admit: 2009-04-01 | Discharge: 2009-04-01 | Payer: Self-pay | Admitting: Emergency Medicine

## 2009-09-01 ENCOUNTER — Ambulatory Visit: Payer: Self-pay | Admitting: Oncology

## 2009-09-02 ENCOUNTER — Ambulatory Visit (HOSPITAL_COMMUNITY): Admission: RE | Admit: 2009-09-02 | Discharge: 2009-09-02 | Payer: Self-pay | Admitting: Oncology

## 2009-09-02 LAB — CBC WITH DIFFERENTIAL/PLATELET
Eosinophils Absolute: 0.2 10*3/uL (ref 0.0–0.5)
HCT: 33.7 % — ABNORMAL LOW (ref 38.4–49.9)
LYMPH%: 13 % — ABNORMAL LOW (ref 14.0–49.0)
MONO#: 0.5 10*3/uL (ref 0.1–0.9)
NEUT#: 3.2 10*3/uL (ref 1.5–6.5)
NEUT%: 70.7 % (ref 39.0–75.0)
Platelets: 189 10*3/uL (ref 140–400)
WBC: 4.6 10*3/uL (ref 4.0–10.3)

## 2009-09-03 LAB — COMPREHENSIVE METABOLIC PANEL
CO2: 19 mEq/L (ref 19–32)
Calcium: 9.8 mg/dL (ref 8.4–10.5)
Chloride: 104 mEq/L (ref 96–112)
Creatinine, Ser: 0.96 mg/dL (ref 0.40–1.50)
Glucose, Bld: 97 mg/dL (ref 70–99)
Sodium: 142 mEq/L (ref 135–145)
Total Bilirubin: 0.5 mg/dL (ref 0.3–1.2)
Total Protein: 6.8 g/dL (ref 6.0–8.3)

## 2009-09-03 LAB — LACTATE DEHYDROGENASE: LDH: 146 U/L (ref 94–250)

## 2009-09-03 LAB — BETA 2 MICROGLOBULIN, SERUM: Beta-2 Microglobulin: 3.33 mg/L — ABNORMAL HIGH (ref 1.01–1.73)

## 2009-12-01 ENCOUNTER — Ambulatory Visit: Payer: Self-pay | Admitting: Oncology

## 2009-12-03 ENCOUNTER — Ambulatory Visit (HOSPITAL_COMMUNITY): Admission: RE | Admit: 2009-12-03 | Discharge: 2009-12-03 | Payer: Self-pay | Admitting: Oncology

## 2009-12-03 LAB — CBC WITH DIFFERENTIAL/PLATELET
Basophils Absolute: 0 10*3/uL (ref 0.0–0.1)
EOS%: 3.7 % (ref 0.0–7.0)
HCT: 34.1 % — ABNORMAL LOW (ref 38.4–49.9)
HGB: 11.9 g/dL — ABNORMAL LOW (ref 13.0–17.1)
MCH: 29.8 pg (ref 27.2–33.4)
MCV: 85.6 fL (ref 79.3–98.0)
MONO%: 13.1 % (ref 0.0–14.0)
NEUT%: 68.8 % (ref 39.0–75.0)
RDW: 17 % — ABNORMAL HIGH (ref 11.0–14.6)

## 2009-12-04 LAB — COMPREHENSIVE METABOLIC PANEL
AST: 21 U/L (ref 0–37)
Alkaline Phosphatase: 41 U/L (ref 39–117)
BUN: 15 mg/dL (ref 6–23)
Creatinine, Ser: 0.8 mg/dL (ref 0.40–1.50)
Total Bilirubin: 0.5 mg/dL (ref 0.3–1.2)

## 2010-03-25 ENCOUNTER — Ambulatory Visit: Payer: Self-pay | Admitting: Oncology

## 2010-03-29 LAB — CBC & DIFF AND RETIC
Basophils Absolute: 0 10*3/uL (ref 0.0–0.1)
Eosinophils Absolute: 0.3 10*3/uL (ref 0.0–0.5)
HCT: 38 % — ABNORMAL LOW (ref 38.4–49.9)
HGB: 13.3 g/dL (ref 13.0–17.1)
LYMPH%: 16 % (ref 14.0–49.0)
MCV: 81.5 fL (ref 79.3–98.0)
MONO#: 0.5 10*3/uL (ref 0.1–0.9)
MONO%: 10.7 % (ref 0.0–14.0)
NEUT#: 3.2 10*3/uL (ref 1.5–6.5)
NEUT%: 66.8 % (ref 39.0–75.0)
Platelets: 140 10*3/uL (ref 140–400)
RBC: 4.66 10*6/uL (ref 4.20–5.82)

## 2010-03-29 LAB — MORPHOLOGY

## 2010-03-30 LAB — COMPREHENSIVE METABOLIC PANEL
Albumin: 4.6 g/dL (ref 3.5–5.2)
BUN: 22 mg/dL (ref 6–23)
CO2: 25 mEq/L (ref 19–32)
Calcium: 9.6 mg/dL (ref 8.4–10.5)
Chloride: 104 mEq/L (ref 96–112)
Glucose, Bld: 94 mg/dL (ref 70–99)
Potassium: 4.1 mEq/L (ref 3.5–5.3)
Sodium: 140 mEq/L (ref 135–145)
Total Protein: 7.1 g/dL (ref 6.0–8.3)

## 2010-03-30 LAB — BETA 2 MICROGLOBULIN, SERUM: Beta-2 Microglobulin: 2.85 mg/L — ABNORMAL HIGH (ref 1.01–1.73)

## 2010-03-30 LAB — IGG, IGA, IGM: IgG (Immunoglobin G), Serum: 882 mg/dL (ref 694–1618)

## 2010-08-25 ENCOUNTER — Other Ambulatory Visit: Payer: Self-pay | Admitting: Oncology

## 2010-08-25 ENCOUNTER — Encounter (HOSPITAL_BASED_OUTPATIENT_CLINIC_OR_DEPARTMENT_OTHER): Payer: MEDICARE | Admitting: Oncology

## 2010-08-25 DIAGNOSIS — M542 Cervicalgia: Secondary | ICD-10-CM

## 2010-08-25 DIAGNOSIS — C8589 Other specified types of non-Hodgkin lymphoma, extranodal and solid organ sites: Secondary | ICD-10-CM

## 2010-08-25 LAB — CBC WITH DIFFERENTIAL/PLATELET
BASO%: 0.2 % (ref 0.0–2.0)
Eosinophils Absolute: 0.3 10*3/uL (ref 0.0–0.5)
HCT: 37.9 % — ABNORMAL LOW (ref 38.4–49.9)
LYMPH%: 6.5 % — ABNORMAL LOW (ref 14.0–49.0)
MCHC: 34.8 g/dL (ref 32.0–36.0)
MCV: 85.7 fL (ref 79.3–98.0)
MONO#: 0.7 10*3/uL (ref 0.1–0.9)
MONO%: 7.3 % (ref 0.0–14.0)
NEUT%: 82.5 % — ABNORMAL HIGH (ref 39.0–75.0)
Platelets: 178 10*3/uL (ref 140–400)
RBC: 4.42 10*6/uL (ref 4.20–5.82)
WBC: 10 10*3/uL (ref 4.0–10.3)

## 2010-08-26 LAB — COMPREHENSIVE METABOLIC PANEL
Alkaline Phosphatase: 58 U/L (ref 39–117)
CO2: 22 mEq/L (ref 19–32)
Creatinine, Ser: 0.9 mg/dL (ref 0.40–1.50)
Glucose, Bld: 138 mg/dL — ABNORMAL HIGH (ref 70–99)
Sodium: 138 mEq/L (ref 135–145)
Total Bilirubin: 0.8 mg/dL (ref 0.3–1.2)
Total Protein: 6.9 g/dL (ref 6.0–8.3)

## 2010-08-26 LAB — LACTATE DEHYDROGENASE: LDH: 151 U/L (ref 94–250)

## 2010-08-26 LAB — BETA 2 MICROGLOBULIN, SERUM: Beta-2 Microglobulin: 2.5 mg/L — ABNORMAL HIGH (ref 1.01–1.73)

## 2010-09-01 ENCOUNTER — Other Ambulatory Visit: Payer: Self-pay | Admitting: Oncology

## 2010-09-01 ENCOUNTER — Encounter (HOSPITAL_BASED_OUTPATIENT_CLINIC_OR_DEPARTMENT_OTHER): Payer: MEDICARE | Admitting: Oncology

## 2010-09-01 DIAGNOSIS — C8589 Other specified types of non-Hodgkin lymphoma, extranodal and solid organ sites: Secondary | ICD-10-CM

## 2010-09-01 DIAGNOSIS — C8590 Non-Hodgkin lymphoma, unspecified, unspecified site: Secondary | ICD-10-CM

## 2010-09-10 NOTE — Consult Note (Signed)
[name], [name] NO.:  [id number]   MEDICAL RECORD NO.:  [id number]          PATIENT TYPE:  INP   LOCATION:  5033                         FACILITY:  [facility]   PHYSICIAN:  Jacquelynn Cree, M.D.   DATE OF BIRTH:  1931/12/06   DATE OF CONSULTATION:  06/23/2006  DATE OF DISCHARGE:                                 CONSULTATION   INTERNAL MEDICINE CONSULTATION:   REASON FOR CONSULTATION:  Medical management of diabetes.   HISTORY OF PRESENT ILLNESS:  The patient is a 77 year old male, who  tripped and fell, while carrying wood off the front steps early this  morning.  He immediately had left ankle pain and trauma, and there was  an obvious deformity.  He was brought to the emergency department and  subsequently admitted by Dr. Marcelino Scot for open reduction and internal  fixation of a left bimalleolar fracture-dislocation and repair of the  left ankle syndesmosis.  He underwent surgery early this morning and is  now in his room postoperatively.   PAST MEDICAL HISTORY:  1. Coronary artery disease, status post catheterization in 1998,      recent echocardiogram by Dr. Rex Kras.  2. Type 2 diabetes.  3. Dyslipidemia.  4. Lymphoma, status post radiation therapy and chemotherapy.      Evidently, this involves his liver.  5. Prostate cancer, status post prostatectomy and anal fissurectomy in      2001.  6. Pancreatitis.  7. Gastroesophageal reflux disease.  8. Cholelithiasis.  9. Nephrolithiasis, status post surgical removal of stones.  10.Degenerative joint disease.  11.History of rotator cuff with impingement, status post left      acromioplasty and resection of distal clavicle.  12.History of cirrhosis by pathology report.   ALLERGIES:  NIACIN causes severe headaches.   CURRENT MEDICATIONS:  1. Aspirin 81 mg daily.  2. Atenolol 100 mg daily.  3. Avapro 300 mg daily.  4. Claritin 10 mg daily.  5. Enalapril 20 mg b.i.d.  6. Hydrochlorothiazide 25 mg daily.  7.  Lipitor 40 mg daily.  8. Metformin 1000 mg b.i.d.  9. Humulin N 43 units b.i.d. subcutaneously.   FAMILY HISTORY:  The patient's mother died at 19 of complications of  diabetes.  The patient's father died at 37 from a massive heart attack.  He has 1 living brother, who has coronary artery disease, lung disease  and peripheral vascular disease.  He has 1 sister, who died at 79 of  lymphoma.  He has another brother who died in his 32s of stomach and  esophageal cancer.  He has 4 stepbrothers, 3 of whom died of CHF, and 1  died of old age.   SOCIAL HISTORY:  The patient is married.  He has a remote history of  social tobacco use.  He was never a regular smoker.  He denies alcohol.  He is a retired Clinical biochemist.   REVIEW OF SYSTEMS:  The patient denies any presyncopal type symptoms or  dizziness prior to his fall.  He simply lost his footing and tripped.  He has no chest pain or shortness of breath.  Bowels  are moving  normally.  All other symptoms reviewed and are negative.   DATA REVIEW:  Sodium is 136, potassium 4.5, chloride 99, bicarb 26, BUN  18, creatinine 0.93, glucose 170.  White blood cell count is 4.3,  hemoglobin 11.9, hematocrit 34.2, platelets 118, with an MCV of 86.  PT  is 13.8, PTT 27.   PHYSICAL EXAMINATION:  Temperature 97.5, blood pressure 128/61, pulse  66, respirations 20, O2 saturation 94% on 2 L.  GENERAL:  This is a slightly obese male, who is in no acute distress.  HEENT:  Normocephalic, atraumatic.  PERRL.  EOMI.  Oropharynx is clear.  NECK:  Supple.  CHEST:  Decreased breath sounds at the bases, but otherwise clear.Marland Kitchen  HEART:  Regular rate and rhythm.  No murmurs, rubs or gallops.  ABDOMEN:  Soft, nontender, nondistended, with normoactive bowel sounds.  EXTREMITIES:  The patient's left lower extremity is wrapped, but he is  able to move his toes without any difficulty.   ASSESSMENT AND PLAN:  1. Type 2 diabetes:  We will check the patient's CBGs q. a.c.  and      h.s., and administer sliding scale insulin along with his normal      doses of metformin and NPH.  Will monitor his glycemic status      closely and titrate his insulin up if needed.  2. Hypertension:  Will resume all of his home medications, with the      exception of his hydrochlorothiazide, which we will start tomorrow.      We will monitor his renal function closely and have a low threshold      for holding his ACE and his ARB if he starts to show signs of renal      insufficiency.  3. Dyslipidemia:  Will continue the patient's statin therapy.  4. Ankle fracture:  Management per the orthopedic team.  5. Normocytic anemia:  The patient's mild normocytic anemia is likely      reflective of his chronic disease.  Will leave further evaluation      and workup to his primary care physicians.  6. Mild thrombocytopenia:  Again, this is likely reflective of his      underlying lymphoma.  Will monitor this.  7. Prophylaxis:  Will initiate GI prophylaxis with Protonix.  Agree      with Lovenox for DVT prophylaxis.   Thank you for this consultation.  We will follow the patient with you.      Jacquelynn Cree, M.D.  Electronically Signed     CR/MEDQ  D:  06/23/2006  T:  06/24/2006  Job:  [id number]   cc:   Astrid Divine. Marcelino Scot, M.D.

## 2010-09-10 NOTE — Discharge Summary (Signed)
NAMEJEANCARLO, [name]              ACCOUNT NO.:  [id number]   MEDICAL RECORD NO.:  [id number]          PATIENT TYPE:  INP   LOCATION:  5033                         FACILITY:  [facility]   PHYSICIAN:  Astrid Divine. Marcelino Scot, M.D. DATE OF BIRTH:  1931/09/26   DATE OF ADMISSION:  06/23/2006  DATE OF DISCHARGE:  06/26/2006                               DISCHARGE SUMMARY   DISCHARGE DIAGNOSIS:  Closed left ankle bimalleolar fracture and  syndesmotic disruption status post open reduction internal fixation.   ADDITIONAL DIAGNOSES:  1. Coronary artery disease.  2. Insulin-dependent diabetes mellitus.  3. Renolithiasis.  4. Hypercholesterolemia.  5. Lymphoma.  6. Pancreatitis.  7. Prostate cancer.  8. Gastroesophageal reflux disease.  9. Chololithiasis.   BRIEF SUMMARY OF HOSPITAL COURSE:  Mr. Minto was seen and evaluated by  the emergency department staff who felt that he had an open fracture.  He was taken emergently to the OR where he was found to have a closed  fracture.  He did undergo ORIF of the medial eschars and abrasions were  carefully dressed.  He remained on antibiotics, we sought to obtain  close glucose control to keep sugars under 200 and set up home health  [place].  He was on both Lovenox and Coumadin with the Lovenox being  bridged.  We did obtain incompass consultation and they assisted with  his management throughout.  On June 26, 2006, he was deemed stable for  discharge to home.  At that time, his incision had very scant serous  drainage and it actually looked excellent.  His INR was 1.8 at that  time.   DISCHARGE INSTRUCTIONS:  Mr. Cull is to maintain non-weightbearing on  the left lower extremity following repair of his fracture/dislocation.  He is to take Coumadin as directed, Vicodin 5/500 one or two every four  to six hours, notify me with any problems, concerns or possibility of  infection.  He is to resume the following medications:   1. Hydrochlorothiazide  25 mg daily.  2. Atenolol 100 mg daily.  3. Enalapril 20 mg b.i.d.  4. Avapro 300 mg daily.  5. Lipitor 40 mg daily.  6. Triglide 160 mg daily.  7. Humulin N 43 units subcutaneously daily a.m.  8. Humulin N 53 units subcutaneously daily in the afternoon.  9. Aspirin 81 mg daily.  10.Multivitamin daily.  11.Claritin 10 mg p.o. daily.  12.Metformin 1000 mg p.o. b.i.d.   He will return to the office in ten to 14 days for removal of sutures if  his wound look stable enough.      Astrid Divine. Marcelino Scot, M.D.  Electronically Signed     MHH/MEDQ  D:  08/10/2006  T:  08/10/2006  Job:  [id number]

## 2010-09-10 NOTE — H&P (Signed)
NAMEJUANMIGUEL, ROJANO              ACCOUNT NO.:  [id number]   MEDICAL RECORD NO.:  [id number]          PATIENT TYPE:  INP   LOCATION:  5033                         FACILITY:  [facility]   PHYSICIAN:  Astrid Divine. Marcelino Scot, M.D. DATE OF BIRTH:  1932-01-08   DATE OF ADMISSION:  06/23/2006  DATE OF DISCHARGE:  06/26/2006                              HISTORY & PHYSICAL   DISCHARGE DIAGNOSES:  1. Left ankle fracture/dislocation.  2. Ruptured syndesmosis.   PROCEDURE PERFORMED:  1. Open reduction/internal fixation of left ankle      fracture/dislocation, bimalleolar.  2. Second repair of ruptured syndesmosis.   ADDITIONAL DIAGNOSES:  1. Type 2 diabetes.  2. Dyslipidemia.  3. Lymphoma.  4. Prostate cancer.  5. Pancreatitis.  6. Gastroesophageal reflux disease.  7. Cholelithiasis.  8. Nephrolithiasis.  9. Degenerative joint disease.  10.Rotator cuff impingement.  11.Cuff tear.  12.Cirrhosis.   BRIEF SUMMARY/HOSPITAL COURSE:  Mr. Stifel was thought to have an open  fracture/dislocation by the emergency physician, placed in a splint,  taken  emergently to the OR.  He underwent the procedure described above  and was found to have a closed fracture intraoperatively.  He tolerated  the procedure well, but was kept for surveillance of his wounds.  Given  the multiple abrasions, as well as his diabetes, which appeared to be  poorly controlled as an outpatient, as well as for Coumadin, to make  sure that we could obtain near-therapeutic INR prior to discharge.  He  was nonweightbearing with PT/OT.  By the day of discharge, his INR was  1.8.  His dressing was changed on postop day #2 and #3, and at that  time, he had some serous drainage only with decreasing drainage and  resolving erythema around the abrasions.   DISCHARGE INSTRUCTIONS:  [name] is to remain nonweightbearing on  the left lower extremity.  He will follow up with the office in 10 days  for suture and staple removal.  He will  continue with daily dressing  changes.  He will continue with Coumadin.  The Lovenox will be  discontinued at discharge.   DISCHARGE MEDICATIONS:  1. Hydrochlorothiazide 25 mg p.o. daily.  2. Atenolol 100 mg p.o. daily.  3. Enalapril 20 mg p.o. b.i.d.  4. Avapro 300 mg p.o. daily.  5. Lipitor 40 mg p.o. daily.  6. Triglide 160 mg p.o. daily.  7. Humulin N 43 units subcu daily q.a.m.  8. Humulin N 53 units subcu daily q.p.m.  9. Aspirin 81 mg p.o. daily.  10.Multivitamin.  11.Claritin 10 mg p.o. daily.  12.Metformin 1000 mg p.o. b.i.d.  13.Coumadin to take as directed, 2.5 mg for the first night.  14.Vicodin 5/500 1-2 tablets every 4-6 hours as needed for pain.      Astrid Divine. Marcelino Scot, M.D.  Electronically Signed     MHH/MEDQ  D:  06/26/2006  T:  06/26/2006  Job:  [id number]

## 2010-09-10 NOTE — Op Note (Signed)
NAME:  DEKLYN, APPLEWHITE                        ACCOUNT NO.:  [id number]   MEDICAL RECORD NO.:  [id number]                   PATIENT TYPE:  AMB   LOCATION:  DSC                                  FACILITY:  [facility]   PHYSICIAN:  Vonna Kotyk. Durward Fortes, M.D.            DATE OF BIRTH:  March 21, 1932   DATE OF PROCEDURE:  06/13/2002  DATE OF DISCHARGE:                                 OPERATIVE REPORT   PREOPERATIVE DIAGNOSES:  1. Rotator cuff tear with impingement, left shoulder.  2. Degenerative joint disease, acromioclavicular joint.   POSTOPERATIVE DIAGNOSES:  1. Rotator cuff tear with impingement, left shoulder.  2. Degenerative joint disease, acromioclavicular joint.   PROCEDURES:  1. Rotator cuff tear with acromioplasty.  2. Resection of distal clavicle.   SURGEON:  Vonna Kotyk. Durward Fortes, M.D.   ASSISTANT:  Erskine Emery, P.A.   ANESTHESIA:  General orotracheal.   COMPLICATIONS:  None.   HISTORY:  A 77 year old gentleman experienced acute onset of shoulder pain  in October while working in his yard.  He has been having progressive  problems since that time to the point of compromise.  With the suspicion of  a rotator cuff tear, he has had an MRI scan that revealed a large-thickness  tear of the supraspinatus and infraspinatus tendons with a 3 x 3 cm gap.  The tendon fibers were retracted with an irregular, attenuated appearance.  There was also attenuation of the infraspinatus and supraspinatus muscle.  He had moderate AC joint degenerative changes and evidence of impingement  clinically.  He is now to have exploration of his rotator cuff.   DESCRIPTION OF PROCEDURE:  With the patient comfortable on the operating  table and under general orotracheal anesthesia, the patient was placed in  the semi-sitting position with the shoulder frame.  The left shoulder was  then prepped with Duraprep from the base of the neck circumferentially to  the mid-forearm.  Sterile draping was  performed.  A marking pen was used to  outline the acromion, the [facility] joint.  An incision was made beginning at the  [facility] joint, coursing anteriorly over the deltoid muscle about two inches in  length.  Via sharp dissection, the incision was carried down through the  subcutaneous tissue.  Small bleeders were Bovie coagulated.  Self-retaining  retractors were inserted.  The [facility] joint capsule was identified and incised  and then the incision carried with the needle-tip Bovie over one inch of the  deltoid fascia.  Self-retaining retractors were inserted and small bleeders  were  Bovie coagulated.  There was considerable overgrowth of the distal  clavicle.  The distal centimeter was removed with an oscillating saw and  then bone wax applied to the bleeding bone surface.  There was still some  prominence remaining beneath the distal clavicle, and this was removed with  a rasp.   There was also considerable overgrowth of the acromion, both at the  AC joint  and anteriorly, and an anterior inferior acromioplasty was performed.  I  could barely put the gloved finger beneath the acromion before the  decompression, and afterwards I could place the finger in easily and it was  nice and round and no sharp points.   The bursa was incised.  There was joint fluid identified, and the tear of  the infraspinatus and supraspinatus tendons was also identified.  The biceps  tendon was intact.  The edges were debrided, a bony trough made in the  anterior humerus.  Two Mitek anchors were inserted and further repair was  performed with a 0 Tycron suture, an excellent repair.  The wound was then  irrigated with saline solution.  The [facility] joint capsule and deltoid fascia  were closed anatomically with 0 Tycron.  Subcu was closed with 2-0 Vicryl,  skin closed with skin clips.  Sterile bulky dressing was applied, followed  by a sling.   The patient did have a supplemental interscalene block.  He will plan to  spend the  night in recovery care and be discharged in the morning on  oxycodone.  I will check him in the office in a week.                                               Vonna Kotyk. Durward Fortes, M.D.    PWW/MEDQ  D:  06/13/2002  T:  06/13/2002  Job:  [id number]

## 2010-09-10 NOTE — Discharge Summary (Signed)
NAMEADREIAN, [name]              ACCOUNT NO.:  [id number]   MEDICAL RECORD NO.:  [id number]          PATIENT TYPE:  INP   LOCATION:  [id number]                         FACILITY:  [facility]   PHYSICIAN:  Astrid Divine. Marcelino Scot, M.D. DATE OF BIRTH:  1931-08-23   DATE OF ADMISSION:  06/28/2006  DATE OF DISCHARGE:  07/04/2006                               DISCHARGE SUMMARY   DISCHARGE DIAGNOSIS:  Left ankle cellulitis.   ADDITIONAL DIAGNOSES:  1. Bacteremia.  2. Diabetes.  3. Coronary artery disease.  4. Hyperlipidemia.  5. Lymphoma.  6. Prostate cancer.  7. Pancreatitis.  8. GERD.  9. Nephrolithiasis.  10.Cholelithiasis.  11.Degenerative joint disease.   CONSULTANTS:  1. Doroteo Bradford. [name], M.D., infectious disease.  2. Rosetta Posner, M.D., vascular surgery.   BRIEF SUMMARY OF HOSPITAL COURSE:  [name] was admitted from clinic  for increasing pain, redness and tenderness along some abrasions that he  had close to his left ankle fracture dislocation which was treated with  ORIF.  X-rays did not show any evidence of involvement with the bone or  the deep tissues.  No purulence was expressible.  He was started on  empiric antibiotics, and blood cultures were obtained.  These were  positive for gram-positive cocci in chains and clusters.  They were  obtained from 2 different sites.  His erythema regressed from his  initial evaluation in clinic, but it did not improve very quickly.  Infectious disease was consulted to assist with management.  Also given  the absence of clearly perceptible pulses and other vascular disease, we  asked Dr. Donnetta Hutching to evaluate him for possible vascular insufficiency  leading to delayed response to antibiotics.  He had a PICC line placed.  Sed rate, CRP, baseline values were 100 and 7.9.  He remained on  Coumadin for DVT prophylaxis.  On review of his vascular studies, Dr.  Donnetta Hutching felt that there was adequate flow to his popliteal for healing of  his infection.   Consequently, he was deemed stable for discharge to  home.   The patient is to remain on his regimen of vancomycin 1.5 grams IV  daily, rifampin 300 mg p.o. daily, and the trough is to be checked in 3  days.  He is resume all of his other medications which are the  following:  Hydrochlorothiazide 25 mg p.o. daily, enalapril 20 mg p.o.  b.i.d., Avapro 300 mg daily, Lipitor 40 mg daily, Tricor 160 mg daily,  metformin 1000 mg b.i.d., atenolol 100 mg every night - it looks like,  Humulin N 43 units q.a.m.,  Humulin N 53 units q.p.m., Coumadin take as directed, aspirin 81 mg  daily, Claritin 10 mg daily.  [name] is to remain nonweightbearing  on the left lower extremity, remain in the splint.  Dressing can be  changed as needed on a daily basis.      Astrid Divine. Marcelino Scot, M.D.  Electronically Signed     MHH/MEDQ  D:  08/10/2006  T:  08/11/2006  Job:  [id number]   cc:   Doroteo Bradford. [name], M.D.  Rosetta Posner, M.D.

## 2010-09-10 NOTE — Op Note (Signed)
[name], [name]              ACCOUNT NO.:  [id number]   MEDICAL RECORD NO.:  [id number]          PATIENT TYPE:  INP   LOCATION:  2550                         FACILITY:  [facility]   PHYSICIAN:  Astrid Divine. Marcelino Scot, M.D. DATE OF BIRTH:  01/15/1932   DATE OF PROCEDURE:  06/23/2006  DATE OF DISCHARGE:                               OPERATIVE REPORT   PREOPERATIVE DIAGNOSIS:  1. Open left ankle fracture dislocation, bimalleolar.  2. Ruptured syndesmosis.   POSTOPERATIVE DIAGNOSIS:  1. Closed bimalleolar ankle fracture dislocation.  2. Ruptured syndesmosis.   PROCEDURE:  1. Open reduction internal fixation of left bimalleolar fracture      dislocation.  2. Open reduction internal fixation of left ankle syndesmosis.   SURGEON:  Altamese .   ASSISTANT:  None.   ANESTHESIA:  General.   ESTIMATED BLOOD LOSS:  80 mL.   TOURNIQUET:  None.   COMPLICATIONS:  None.   DISPOSITION:  To PACU.   CONDITION:  Stable.   BRIEF SUMMARY OF INDICATION FOR PROCEDURE:  [name] is a 77-year-  old diabetic male who fell down the steps while bringing in some  firewood at about 4:00 a.m. this morning.  He had immediate onset of  pain, deformity and some bleeding.  He was seen and evaluated by Dr.  Roxanne Mins in the emergency department who felt that his medial abrasion  communicated with the fracture site.  He underwent an attempted closed  reduction in the ED with persistent dislocation laterally but with less  displacement.  He was placed into a splint at that time.  I was informed  it was an open fracture.  I discussed with the family the risks and  benefits of surgery including the poor prognosis with open fractures in  diabetics.  They understood this risk clearly, including a report of 50%  amputation rate in some series.  He understood additionally the risks of  nonunion, malunion, arthritis, decreased range of motion, infection,  neurovascular injury, post-traumatic arthritis, need  for further  surgery, DVT, PE, heart attack, stroke and others.  We discussed the  need for blood thinners in particular, given his history of malignancy.  After full discussion, the patient and family wished to proceed.   BRIEF DESCRIPTION OF PROCEDURE:  [name] was taken to the operating  room.  He did receive Ancef in the emergency department downstairs.  His  splint was left intact well while general anesthesia was induced.  It  was then removed, and at that time, there was clearly an abrasion over  the medial aspect of the ankle with no expressible bleeding.  He also  had abrasion over the great toe MTP joint.  These areas were cleaned  thoroughly with chlorhexidine and then a standard prep and drape  performed of the entire left lower extremity.  A tourniquet was placed  but never used during the case.   We began laterally through an 8-cm incision over the fracture site,  extending distal enough to reach level of the joint.  Very carefully  watched for the superficial peroneal nerve in the dissection, but this  was not identified.  The nerve was protected anteriorly with Bennett's  used for retraction over the fracture site.  Reduction maneuver was held  and maintained with a tenaculum, and then an 8-hole 1/3 tubular plate  affixed to squeeze the fracture site where we placed one lag screw  through the plate and additional screws in compression.  We left the pin  ultimate hole empty for placement of his syndesmotic screw.  We then  turned our attention to the medial side where a separate 4-cm incision  was made slightly more posterior than usual was given the anteromedial  abrasion.  We cleaned fracture site out, examined the joint and removed  a 2 x 1-cm loose body and chondral flap within the joint.  This was not  repairable.  It appeared to have come from the talus posterior medially.  The joint was copiously irrigated and then a direct anatomic reduction  made and held  with a dental pick while two 2-5 drills were placed up  into the distal metaphysis.  These were then exchanged sequentially for  3-5 partially cannulated screws.  We were able to achieve excellent  compression across the fracture site.  We then placed a Erma Pinto clamp  through the head of 1 of the screws laterally and proximal to the medial  malleolus fracture site and obtained a reduction of the syndesmosis.  This was then fixed with 1 screw distally.  After, we then exchanged the  screw immediately proximal to this for another syndesmotic screw.  Given  the patient's diabetes and possible neuropathy, we do not wish to lose  fixation of the syndesmosis.  This resulted in what appeared to be an  anatomic reduction on AP mortise and lateral x-rays.  The wounds were  copiously irrigated and then closed in standard layered fashion with  Vicryl and 3-0 nylon as well as staples laterally.  Posterior and  stirrup splint was then applied to protect the repair.  The patient was  awakened from anesthesia and transported to PACU in stable condition.  It should be noted that the fibular reduction was observed to be  anatomic anteriorly and laterally, but there was some posterior  comminution that still had soft tissue attachment, and these attachments  were left intact.   PROGNOSIS:  [name] prognosis with this fracture is good, given  the ability to restore stability.  He is at risk for DVT and will be  placed on blood thinner for about 8 weeks, primarily because of his  history of malignancy.  He will have PT and OT and then home health.  He  will be non-weight bearing on the left lower extremity for the next 6  weeks with graduated weightbearing thereafter.  We have asked the  [facility] service to see him given his multiple medical problems, but  these seem to be well-compensated at this time.      Astrid Divine. Marcelino Scot, M.D.  Electronically Signed    MHH/MEDQ  D:  06/23/2006  T:   06/23/2006  Job:  [id number]

## 2011-01-21 LAB — POCT I-STAT, CHEM 8
Calcium, Ion: 1.19
Chloride: 106
Creatinine, Ser: 0.8
Glucose, Bld: 116 — ABNORMAL HIGH
HCT: 39

## 2011-03-02 ENCOUNTER — Other Ambulatory Visit (HOSPITAL_BASED_OUTPATIENT_CLINIC_OR_DEPARTMENT_OTHER): Payer: MEDICARE | Admitting: Lab

## 2011-03-02 ENCOUNTER — Ambulatory Visit (HOSPITAL_COMMUNITY)
Admission: RE | Admit: 2011-03-02 | Discharge: 2011-03-02 | Disposition: A | Discharge status: Home / Self Care | Payer: MEDICARE | Source: Ambulatory Visit | Attending: Oncology | Admitting: Oncology

## 2011-03-02 ENCOUNTER — Other Ambulatory Visit: Payer: Self-pay | Admitting: Oncology

## 2011-03-02 DIAGNOSIS — K7689 Other specified diseases of liver: Secondary | ICD-10-CM | POA: Insufficient documentation

## 2011-03-02 DIAGNOSIS — C8589 Other specified types of non-Hodgkin lymphoma, extranodal and solid organ sites: Secondary | ICD-10-CM

## 2011-03-02 DIAGNOSIS — C8590 Non-Hodgkin lymphoma, unspecified, unspecified site: Secondary | ICD-10-CM

## 2011-03-02 DIAGNOSIS — C8583 Other specified types of non-Hodgkin lymphoma, intra-abdominal lymph nodes: Secondary | ICD-10-CM

## 2011-03-02 DIAGNOSIS — K8020 Calculus of gallbladder without cholecystitis without obstruction: Secondary | ICD-10-CM | POA: Insufficient documentation

## 2011-03-02 DIAGNOSIS — M542 Cervicalgia: Secondary | ICD-10-CM

## 2011-03-02 LAB — CBC WITH DIFFERENTIAL/PLATELET
BASO%: 0.2 % (ref 0.0–2.0)
EOS%: 8 % — ABNORMAL HIGH (ref 0.0–7.0)
HCT: 36.6 % — ABNORMAL LOW (ref 38.4–49.9)
LYMPH%: 12.5 % — ABNORMAL LOW (ref 14.0–49.0)
MCH: 30.2 pg (ref 27.2–33.4)
MCHC: 34.5 g/dL (ref 32.0–36.0)
MCV: 87.3 fL (ref 79.3–98.0)
MONO%: 8.5 % (ref 0.0–14.0)
NEUT%: 70.8 % (ref 39.0–75.0)
Platelets: 160 10*3/uL (ref 140–400)
lymph#: 0.6 10*3/uL — ABNORMAL LOW (ref 0.9–3.3)

## 2011-03-03 LAB — COMPREHENSIVE METABOLIC PANEL
ALT: 23 U/L (ref 0–53)
Albumin: 4.5 g/dL (ref 3.5–5.2)
CO2: 24 mEq/L (ref 19–32)
Calcium: 9.3 mg/dL (ref 8.4–10.5)
Chloride: 105 mEq/L (ref 96–112)
Glucose, Bld: 210 mg/dL — ABNORMAL HIGH (ref 70–99)
Sodium: 139 mEq/L (ref 135–145)
Total Bilirubin: 0.3 mg/dL (ref 0.3–1.2)
Total Protein: 6.8 g/dL (ref 6.0–8.3)

## 2011-03-03 LAB — LACTATE DEHYDROGENASE: LDH: 128 U/L (ref 94–250)

## 2011-03-03 LAB — BETA 2 MICROGLOBULIN, SERUM: Beta-2 Microglobulin: 2.04 mg/L — ABNORMAL HIGH (ref 1.01–1.73)

## 2011-03-09 ENCOUNTER — Ambulatory Visit (HOSPITAL_BASED_OUTPATIENT_CLINIC_OR_DEPARTMENT_OTHER): Payer: MEDICARE | Admitting: Oncology

## 2011-03-09 VITALS — BP 117/70 | HR 80 | Temp 98.7°F | Ht 67.0 in | Wt 206.0 lb

## 2011-03-09 DIAGNOSIS — E119 Type 2 diabetes mellitus without complications: Secondary | ICD-10-CM

## 2011-03-09 DIAGNOSIS — H905 Unspecified sensorineural hearing loss: Secondary | ICD-10-CM

## 2011-03-09 DIAGNOSIS — D492 Neoplasm of unspecified behavior of bone, soft tissue, and skin: Secondary | ICD-10-CM

## 2011-03-09 DIAGNOSIS — Z87898 Personal history of other specified conditions: Secondary | ICD-10-CM

## 2011-03-09 NOTE — Progress Notes (Signed)
ID: [name]   Interval History: Al returns today with Inez Catalina his wife for followup of his lymphoma. The Endo history is unremarkable. He continues to do a lot of gardening. He takes an active a day with his dog on his lap. He doesn't wears glasses so he doesn't see "as well as I should" but otherwise is doing "pretty good" and have been no significant changes since the last visit  ROS:  She has aches and pains you're and they're they're very intermittent and "it's all arthritis". Certainly there has been no change in the pattern intensity or frequency. He has some hearing loss which is not bad apparent occasionally his feet swell. He's had some skin cancers because of course he is some exposed most of the time his diabetes is not well controlled at present he thinks but otherwise a detailed review of systems was noncontributory  Medications: I have reviewed the patient's current medications.        Objective: Vital signs in last 24 hours: BP 117/70  Pulse 80  Temp 98.7 F (37.1 C)  Ht 5\' 7"  (1.702 m)  Wt 206 lb (93.441 kg)  BMI 32.26 kg/m2   Physical Exam:    Sclerae unicteric  Oropharynx clear  No axillary, [place] or other peripheral adenopathy  Lungs clear -- no rales or rhonchi  Heart regular rate and rhythm  Abdomen obese, [place], +BS, no hepatic or splenic enlargement by exam  MSK no focal spinal tenderness, no peripheral edema  Neuro nonfocal    Lab Results:   BMET    Component Value Date/Time   NA 139 03/02/2011 0813   NA 139 03/02/2011 0813   NA 139 03/02/2011 0813   K 4.4 03/02/2011 0813   K 4.4 03/02/2011 0813   K 4.4 03/02/2011 0813   CL 105 03/02/2011 0813   CL 105 03/02/2011 0813   CL 105 03/02/2011 0813   CO2 24 03/02/2011 0813   CO2 24 03/02/2011 0813   CO2 24 03/02/2011 0813   GLUCOSE 210* 03/02/2011 0813   GLUCOSE 210* 03/02/2011 0813   GLUCOSE 210* 03/02/2011 0813   BUN 13 03/02/2011 0813   BUN 13 03/02/2011 0813   BUN 13 03/02/2011 0813   CREATININE 0.87  03/02/2011 0813   CREATININE 0.87 03/02/2011 0813   CREATININE 0.87 03/02/2011 0813   CALCIUM 9.3 03/02/2011 0813   CALCIUM 9.3 03/02/2011 0813   CALCIUM 9.3 03/02/2011 0813   CALCIUM 11.0* 08/17/2005 0842     CMP     Component Value Date/Time   NA 139 03/02/2011 0813   NA 139 03/02/2011 0813   NA 139 03/02/2011 0813   K 4.4 03/02/2011 0813   K 4.4 03/02/2011 0813   K 4.4 03/02/2011 0813   CL 105 03/02/2011 0813   CL 105 03/02/2011 0813   CL 105 03/02/2011 0813   CO2 24 03/02/2011 0813   CO2 24 03/02/2011 0813   CO2 24 03/02/2011 0813   GLUCOSE 210* 03/02/2011 0813   GLUCOSE 210* 03/02/2011 0813   GLUCOSE 210* 03/02/2011 0813   BUN 13 03/02/2011 0813   BUN 13 03/02/2011 0813   BUN 13 03/02/2011 0813   CREATININE 0.87 03/02/2011 0813   CREATININE 0.87 03/02/2011 0813   CREATININE 0.87 03/02/2011 0813   CALCIUM 9.3 03/02/2011 0813   CALCIUM 9.3 03/02/2011 0813   CALCIUM 9.3 03/02/2011 0813   CALCIUM 11.0* 08/17/2005 0842   PROT 6.8 03/02/2011 0813   PROT 6.8 03/02/2011 0813  PROT 6.8 03/02/2011 0813   ALBUMIN 4.5 03/02/2011 0813   ALBUMIN 4.5 03/02/2011 0813   ALBUMIN 4.5 03/02/2011 0813   AST 22 03/02/2011 0813   AST 22 03/02/2011 0813   AST 22 03/02/2011 0813   ALT 23 03/02/2011 0813   ALT 23 03/02/2011 0813   ALT 23 03/02/2011 0813   ALKPHOS 47 03/02/2011 0813   ALKPHOS 47 03/02/2011 0813   ALKPHOS 47 03/02/2011 0813   BILITOT 0.3 03/02/2011 0813   BILITOT 0.3 03/02/2011 0813   BILITOT 0.3 03/02/2011 0813    CBC    Component Value Date/Time   WBC 4.9 03/02/2011 0813   WBC 5.8 08/11/2006 2035   RBC 4.30 08/11/2006 2035   HGB 12.6* 03/02/2011 0813   HGB 13.3 11/25/2007 1236   HCT 36.6* 03/02/2011 0813   HCT 39.0 11/25/2007 1236   PLT 160 03/02/2011 0813   PLT 148* 08/11/2006 2035   MCV 87.3 03/02/2011 0813   MCV 88.8 08/11/2006 2035   MCHC 32.7 08/11/2006 2035   RDW 15.4* 08/11/2006 2035   LYMPHSABS 0.8 08/11/2006 2035   MONOABS 0.6 08/11/2006 2035   EOSABS 0.4 03/02/2011 0813   EOSABS 0.3 08/11/2006 2035    BASOSABS 0.0 03/02/2011 0813   BASOSABS 0.0 08/11/2006 2035        Studies/Results: US Abdomen Complete  03/02/2011  *RADIOLOGY REPORT*  Clinical Data:  Lymphoma.  ABDOMINAL ULTRASOUND COMPLETE  Comparison:  None.  Findings:  Gallbladder:  Multiple stones are identified within the gallbladder lumen.  No gallbladder wall thickening or pericholecystic fluid. Negative sonographic Murphy's sign.  Common Bile Duct:  Within normal limits in caliber.  Liver: Complex mass within the left hepatic lobe measures 8.8 x 5.6 x 6.4 centimeters.  Previously this measured 9.0 x 4.8 x 7.1 cm. Within the superior aspect of left lobe of liver just beneath the heart there is a smaller cystic structure which measures 1.5 x 1.1 x 1.4 cm.  Unchanged from previous exam.  IVC:  Diminished exam detail.  Pancreas:  Diminished exam detail.  No abnormality identified.  Spleen:  Upper limits of normal measuring 12.2 cm.  Right kidney:  Normal in size and parenchymal echogenicity.  No evidence of mass or hydronephrosis.  Left kidney:  Normal in size and parenchymal echogenicity.  No evidence of mass or hydronephrosis. Calcifications identified within the inferior pole of the left kidney.  Abdominal Aorta:  No aneurysm identified.  IMPRESSION: 1.  No significant change in complex mass within the left lobe of the liver 2.  Smaller cystic structure within the left lobe of liver is also unchanged. 3.  Gallstones.  Original Report Authenticated By: Angelita Ingles, M.D.   Assessment: 77 year old [place] man with a diagnosis of primary hepatic non-Hodgkin's lymphoma established through liver biopsy February of 2006, treated with rituximab x9 and CHOP chemotherapy x6 completed in 2007, with maintenance rituximab discontinued October of 2006  Plan: There is no evidence of disease recurrence. His beta 2 microglobulin is actually the lowest it's been in years. In the absence of "B." symptoms, and with a totally stable liver ultrasound and  labs, I feel very comfortable releasing him from followup.  I understand he sees Dr. Shelia Media for diabetes followup quite frequently. So long as Al does not have unexplained fatigue unexplained weight loss or any unexplained fever or drenching sweats or other "B. symptoms" he does not have to return to see me, although of course I will be glad to see him at  any point in the future at Dr. Pennie Banter discretion  Lurline Del C 03/09/2011

## 2011-06-11 ENCOUNTER — Encounter (HOSPITAL_COMMUNITY): Payer: Self-pay | Admitting: *Deleted

## 2011-06-11 ENCOUNTER — Emergency Department (HOSPITAL_COMMUNITY)
Admission: EM | Admit: 2011-06-11 | Discharge: 2011-06-11 | Disposition: A | Discharge status: Home / Self Care | Payer: Medicare Other | Source: Home / Self Care | Attending: Emergency Medicine | Admitting: Emergency Medicine

## 2011-06-11 DIAGNOSIS — T148XXA Other injury of unspecified body region, initial encounter: Secondary | ICD-10-CM

## 2011-06-11 DIAGNOSIS — IMO0002 Reserved for concepts with insufficient information to code with codable children: Secondary | ICD-10-CM

## 2011-06-11 DIAGNOSIS — X58XXXA Exposure to other specified factors, initial encounter: Secondary | ICD-10-CM

## 2011-06-11 HISTORY — DX: Malignant neoplasm of liver, not specified as primary or secondary: C22.9

## 2011-06-11 HISTORY — DX: Essential (primary) hypertension: I10

## 2011-06-11 HISTORY — DX: Malignant neoplasm of prostate: C61

## 2011-06-11 NOTE — ED Provider Notes (Signed)
Chief Complaint  Patient presents with  . Laceration    History of Present Illness:  [name] is a 78 year old male who lacerated his left lateral orbital rim about hour ago. He was putting in a metal stake in the ground to hold a bird feeder, the stake kicked back and struck him just lateral to the left eye, causing a small laceration over the orbital rim. He denies any loss of consciousness, headache, bleeding from the nose or ears, diplopia, or blurred vision. He hasn't had any facial pain, numbness, tingling of the face, arms, or legs, weakness of the arms or legs, or difficulty with speech or ambulation. He does not take anticoagulants.  Review of Systems:  Other than noted above, the patient denies any of the following symptoms: Systemic:  No fever or chills. Eye:  No eye pain, redness, diplopia or blurred vision ENT:  No bleeding from nose or ears.  No loose or broken teeth. Neck:  No pain or limited ROM. GI:  No nausea or vomiting. Neuro:  No loss of consciousness, seizure activity, numbness, tingling, or weakness.  [name]:  Past medical history, family history, social history, meds, and allergies were reviewed.  Physical Exam:   Vital signs:  BP 139/61  Pulse 72  Temp(Src) 98.8 F (37.1 C) (Oral)  Resp 18  SpO2 98% General:  Alert and oriented times 3.  In no distress. Eye:  PERRL, full EOMs.  Lids and conjunctivas normal. HEENT:  There is a small, stellate, laceration over the left lateral orbital rim measuring no more than 8 mm in total diameter. There was no facial pain to palpation. PERRLA, full EOMs, conjunctiva is, corneas, anterior chambers appeared normal. Eyelids look normal.  TMs and canals normal, nasal mucosa normal.  No oral lacerations.  Teeth were intact without obvious oral trauma. Neck:  Non tender.  Full ROM without pain. Neurological:  Alert and oriented.  Cranial nerves intact.  No pronator drift.  No muscle weakness.  Sensation was intact to light touch.  Gait was normal.  Procedure: Verbal informed consent was obtained.  The patient was informed of the risks and benefits of the procedure and understands and accepts.  Identity of the patient was verified verbally and by wristband.   The laceration area described above was prepped with saline, patted dry, and Dermabond was applied x2. This was allowed to dry sufficiently, and the patient was instructed in wound care.  Assessment:   Diagnoses that have been ruled out:  None  Diagnoses that are still under consideration:  None  Final diagnoses:  Laceration    Plan:   1.  The following meds were prescribed:   New Prescriptions   No medications on file   2.  The patient was instructed in wound care and pain control, and handouts were given. 3.  The patient was told to return if any sign of infection.    Birdena Crandall, MD 06/11/11 1535

## 2011-06-11 NOTE — ED Notes (Addendum)
Pt with laceration above left eye onset approx one hour ago - bleeding controlled tetnus 2010

## 2011-06-11 NOTE — Discharge Instructions (Signed)

## 2011-09-21 ENCOUNTER — Other Ambulatory Visit: Payer: Self-pay | Admitting: Dermatology

## 2011-12-05 ENCOUNTER — Other Ambulatory Visit: Payer: Self-pay | Admitting: Cardiology

## 2011-12-05 ENCOUNTER — Encounter (INDEPENDENT_AMBULATORY_CARE_PROVIDER_SITE_OTHER): Payer: Medicare Other

## 2011-12-05 DIAGNOSIS — I701 Atherosclerosis of renal artery: Secondary | ICD-10-CM

## 2011-12-05 DIAGNOSIS — I10 Essential (primary) hypertension: Secondary | ICD-10-CM

## 2013-02-22 ENCOUNTER — Other Ambulatory Visit: Payer: Self-pay | Admitting: Dermatology

## 2013-02-27 ENCOUNTER — Other Ambulatory Visit: Payer: Self-pay | Admitting: Orthopedic Surgery

## 2013-02-27 DIAGNOSIS — R609 Edema, unspecified: Secondary | ICD-10-CM

## 2013-02-27 DIAGNOSIS — M79671 Pain in right foot: Secondary | ICD-10-CM

## 2013-03-04 ENCOUNTER — Ambulatory Visit
Admission: RE | Admit: 2013-03-04 | Discharge: 2013-03-04 | Disposition: A | Discharge status: Home / Self Care | Payer: Medicare Other | Source: Ambulatory Visit | Attending: Orthopedic Surgery | Admitting: Orthopedic Surgery

## 2013-03-04 DIAGNOSIS — R609 Edema, unspecified: Secondary | ICD-10-CM

## 2013-03-04 DIAGNOSIS — M79671 Pain in right foot: Secondary | ICD-10-CM

## 2013-03-05 ENCOUNTER — Other Ambulatory Visit: Payer: Self-pay | Admitting: Internal Medicine

## 2013-03-05 DIAGNOSIS — R1011 Right upper quadrant pain: Secondary | ICD-10-CM

## 2013-03-06 ENCOUNTER — Telehealth (INDEPENDENT_AMBULATORY_CARE_PROVIDER_SITE_OTHER): Payer: Self-pay

## 2013-03-06 NOTE — Telephone Encounter (Signed)
Per daughters request pt appt moved up to 03-13-13 to arrive at 10:15am. LMOM.

## 2013-03-07 ENCOUNTER — Emergency Department (HOSPITAL_COMMUNITY)
Admission: EM | Admit: 2013-03-07 | Discharge: 2013-03-07 | Disposition: A | Discharge status: Home / Self Care | Payer: Medicare Other | Attending: Emergency Medicine | Admitting: Emergency Medicine

## 2013-03-07 ENCOUNTER — Encounter (HOSPITAL_COMMUNITY): Payer: Self-pay | Admitting: Emergency Medicine

## 2013-03-07 ENCOUNTER — Emergency Department (HOSPITAL_COMMUNITY): Payer: Medicare Other

## 2013-03-07 ENCOUNTER — Other Ambulatory Visit: Payer: Medicare Other

## 2013-03-07 ENCOUNTER — Telehealth: Payer: Self-pay | Admitting: *Deleted

## 2013-03-07 DIAGNOSIS — M549 Dorsalgia, unspecified: Secondary | ICD-10-CM | POA: Insufficient documentation

## 2013-03-07 DIAGNOSIS — R1909 Other intra-abdominal and pelvic swelling, mass and lump: Secondary | ICD-10-CM | POA: Insufficient documentation

## 2013-03-07 DIAGNOSIS — Z85828 Personal history of other malignant neoplasm of skin: Secondary | ICD-10-CM | POA: Insufficient documentation

## 2013-03-07 DIAGNOSIS — R3989 Other symptoms and signs involving the genitourinary system: Secondary | ICD-10-CM | POA: Insufficient documentation

## 2013-03-07 DIAGNOSIS — I10 Essential (primary) hypertension: Secondary | ICD-10-CM | POA: Insufficient documentation

## 2013-03-07 DIAGNOSIS — R110 Nausea: Secondary | ICD-10-CM | POA: Insufficient documentation

## 2013-03-07 DIAGNOSIS — R1900 Intra-abdominal and pelvic swelling, mass and lump, unspecified site: Secondary | ICD-10-CM

## 2013-03-07 DIAGNOSIS — R109 Unspecified abdominal pain: Secondary | ICD-10-CM

## 2013-03-07 DIAGNOSIS — E119 Type 2 diabetes mellitus without complications: Secondary | ICD-10-CM | POA: Insufficient documentation

## 2013-03-07 DIAGNOSIS — Z794 Long term (current) use of insulin: Secondary | ICD-10-CM | POA: Insufficient documentation

## 2013-03-07 DIAGNOSIS — Z8505 Personal history of malignant neoplasm of liver: Secondary | ICD-10-CM | POA: Insufficient documentation

## 2013-03-07 DIAGNOSIS — Z7982 Long term (current) use of aspirin: Secondary | ICD-10-CM | POA: Insufficient documentation

## 2013-03-07 DIAGNOSIS — Z79899 Other long term (current) drug therapy: Secondary | ICD-10-CM | POA: Insufficient documentation

## 2013-03-07 DIAGNOSIS — Z87442 Personal history of urinary calculi: Secondary | ICD-10-CM | POA: Insufficient documentation

## 2013-03-07 DIAGNOSIS — Z8546 Personal history of malignant neoplasm of prostate: Secondary | ICD-10-CM | POA: Insufficient documentation

## 2013-03-07 HISTORY — DX: Unspecified malignant neoplasm of skin, unspecified: C44.90

## 2013-03-07 LAB — CBC WITH DIFFERENTIAL/PLATELET
Basophils Absolute: 0 10*3/uL (ref 0.0–0.1)
Basophils Relative: 0 % (ref 0–1)
HCT: 35.8 % — ABNORMAL LOW (ref 39.0–52.0)
MCHC: 34.4 g/dL (ref 30.0–36.0)
Monocytes Absolute: 0.6 10*3/uL (ref 0.1–1.0)
Neutro Abs: 6.1 10*3/uL (ref 1.7–7.7)
Platelets: 161 10*3/uL (ref 150–400)
RDW: 14.7 % (ref 11.5–15.5)

## 2013-03-07 LAB — COMPREHENSIVE METABOLIC PANEL
AST: 20 U/L (ref 0–37)
Albumin: 4.1 g/dL (ref 3.5–5.2)
Calcium: 9.5 mg/dL (ref 8.4–10.5)
Chloride: 101 mEq/L (ref 96–112)
Creatinine, Ser: 0.8 mg/dL (ref 0.50–1.35)
Sodium: 137 mEq/L (ref 135–145)
Total Bilirubin: 0.3 mg/dL (ref 0.3–1.2)

## 2013-03-07 LAB — URINALYSIS, ROUTINE W REFLEX MICROSCOPIC
Glucose, UA: NEGATIVE mg/dL
Leukocytes, UA: NEGATIVE
pH: 5.5 (ref 5.0–8.0)

## 2013-03-07 LAB — CG4 I-STAT (LACTIC ACID): Lactic Acid, Venous: 1.98 mmol/L (ref 0.5–2.2)

## 2013-03-07 MED ORDER — HYDROMORPHONE HCL PF 1 MG/ML IJ SOLN
1.0000 mg | Freq: Once | INTRAMUSCULAR | Status: AC
  Administered 2013-03-07: 1 mg via INTRAVENOUS
  Filled 2013-03-07: qty 1

## 2013-03-07 MED ORDER — OXYCODONE-ACETAMINOPHEN 5-325 MG PO TABS: 2.0000 | ORAL_TABLET | ORAL | Status: DC | PRN

## 2013-03-07 MED ORDER — OXYCODONE-ACETAMINOPHEN 5-325 MG PO TABS
1.0000 | ORAL_TABLET | Freq: Once | ORAL | Status: AC
  Administered 2013-03-07: 1 via ORAL
  Filled 2013-03-07: qty 1

## 2013-03-07 NOTE — ED Provider Notes (Signed)
CSN: [id number]     Arrival date & time 03/07/13  [id number] History   First MD Initiated Contact with Patient 03/07/13 0800     Chief Complaint  Patient presents with  . Back Pain  . Flank Pain   (Consider location/radiation/quality/duration/timing/severity/associated sxs/prior Treatment) The history is provided by the patient. No language interpreter was used.   Patient is 79 year old Caucasian male past medical history of kidney stones who comes emergency department today with right flank pain has been present for approximately 3-4 weeks.  The pain has been intermittent and getting progressively worse. Week ago he visited his primary care physician for similar complaint he scheduled the right upper quadrant ultrasound for today. Patient has had some intermittent nausea associated with the pain however he has had no vomiting. Eating does not make the pain worse. He has had decreased urination today. He has no dysuria, frequency, or urgency. No weakness or numbness. Denies any fevers or chills. He states the pain is similar to his previous kidney stones. He currently rates his pain at 9/10. In the past has required surgery for his kidney stones and has had stones up to 16 mm in size.  Past Medical History  Diagnosis Date  . Diabetes mellitus   . Hypertension   . Cancer of liver   . Kidney stone   . Prostate cancer   . Skin cancer    Past Surgical History  Procedure Laterality Date  . Prostatectomy    . Kidney stone surgery    . Rotator cuff repair    . Ankle surgery     History reviewed. No pertinent family history. History  Substance Use Topics  . Smoking status: Never Smoker   . Smokeless tobacco: Not on file  . Alcohol Use: No    Review of Systems  Constitutional: Negative for fever and chills.  Respiratory: Negative for cough and shortness of breath.   Cardiovascular: Negative for chest pain.  Gastrointestinal: Negative for nausea, vomiting, diarrhea and constipation.   Genitourinary: Positive for decreased urine volume. Negative for dysuria, urgency, frequency, penile swelling, scrotal swelling and testicular pain.  Musculoskeletal: Positive for back pain.  All other systems reviewed and are negative.    Allergies  Niacin  Home Medications   Current Outpatient Rx  Name  Route  Sig  Dispense  Refill  . amLODipine (NORVASC) 5 MG tablet   Oral   Take 5 mg by mouth daily.         Marland Kitchen aspirin 81 MG tablet   Oral   Take 81 mg by mouth daily.         Marland Kitchen atenolol (TENORMIN) 100 MG tablet   Oral   Take 100 mg by mouth daily.         Marland Kitchen atorvastatin (LIPITOR) 10 MG tablet   Oral   Take 10 mg by mouth daily.         . cetirizine (ZYRTEC) 10 MG tablet   Oral   Take 10 mg by mouth daily.         . Cyanocobalamin (VITAMIN B 12 PO)   Oral   Take by mouth.         . ezetimibe (ZETIA) 10 MG tablet   Oral   Take 10 mg by mouth daily.         . FENOFIBRATE PO   Oral   Take 160 mg by mouth 1 day or 1 dose.         Marland Kitchen  fish oil-omega-3 fatty acids 1000 MG capsule   Oral   Take 2 g by mouth 2 (two) times daily.         Marland Kitchen gabapentin (NEURONTIN) 300 MG capsule   Oral   Take 300 mg by mouth 2 (two) times daily.         . hydrochlorothiazide (HYDRODIURIL) 25 MG tablet   Oral   Take 25 mg by mouth daily.         . insulin NPH (HUMULIN N,NOVOLIN N) 100 UNIT/ML injection   Subcutaneous   Inject 34-36 Units into the skin. 36 units the morning and 34 after supper         . lisinopril (PRINIVIL,ZESTRIL) 40 MG tablet   Oral   Take 40 mg by mouth daily.         . metFORMIN (GLUCOPHAGE) 1000 MG tablet   Oral   Take 1,000 mg by mouth 2 (two) times daily with a meal.         . Multiple Vitamins-Minerals (MULTIVITAMIN WITH MINERALS) tablet   Oral   Take 1 tablet by mouth daily.         . sertraline (ZOLOFT) 100 MG tablet   Oral   Take 100 mg by mouth daily.         Marland Kitchen oxyCODONE-acetaminophen (PERCOCET/ROXICET) 5-325  MG per tablet   Oral   Take 2 tablets by mouth every 4 (four) hours as needed for severe pain.   40 tablet   0    BP 162/69  Pulse 56  Temp(Src) 98 F (36.7 C) (Oral)  Resp 18  SpO2 98% Physical Exam  Nursing note and vitals reviewed. Constitutional: He is oriented to person, place, and time. He appears well-developed and well-nourished. No distress.  HENT:  Head: Normocephalic and atraumatic.  Eyes: Pupils are equal, round, and reactive to light.  Neck: Normal range of motion.  Cardiovascular: Normal rate, regular rhythm and normal heart sounds.   Pulmonary/Chest: Effort normal and breath sounds normal. No respiratory distress. He has no decreased breath sounds. He has no wheezes. He has no rhonchi. He has no rales.  Abdominal: Soft. He exhibits no distension. There is no tenderness. There is no rigidity, no rebound, no guarding, no CVA tenderness and negative Murphy's sign.  Musculoskeletal: He exhibits no edema and no tenderness.  Neurological: He is alert and oriented to person, place, and time. He exhibits normal muscle tone.  Skin: Skin is warm and dry.    ED Course  Procedures (including critical care time) Labs Review Labs Reviewed  CBC WITH DIFFERENTIAL - Abnormal; Notable for the following:    Hemoglobin 12.3 (*)    HCT 35.8 (*)    Neutrophils Relative % 80 (*)    Lymphocytes Relative 8 (*)    Lymphs Abs 0.6 (*)    All other components within normal limits  COMPREHENSIVE METABOLIC PANEL - Abnormal; Notable for the following:    Glucose, Bld 125 (*)    GFR calc non Af Amer 83 (*)    All other components within normal limits  LIPASE, BLOOD  URINALYSIS, ROUTINE W REFLEX MICROSCOPIC  CG4 I-STAT (LACTIC ACID)   Imaging Review Ct Abdomen Pelvis Wo Contrast  03/07/2013   CLINICAL DATA:  Back pain and right flank pain for 1 week. Dysuria. Diarrhea 1 week ago. History of prostatectomy 15 years ago. History of hepatocellular carcinoma with chemotherapy 3 years ago.  Diabetes. Skin cancer.  EXAM: CT ABDOMEN AND PELVIS WITHOUT  CONTRAST  TECHNIQUE: Multidetector CT imaging of the abdomen and pelvis was performed following the standard protocol without intravenous contrast.  COMPARISON:  10/13/2005  FINDINGS: Lower Chest: Bibasilar scarring or atelectasis. Mild cardiomegaly with dense coronary artery atherosclerosis. No pericardial or pleural effusion.  Abdomen/Pelvis: Cirrhosis. Scattered right hepatic lobe calcifications without well-defined residual or recurrent mass. There is a 6 mm low-density focus adjacent the gallbladder on image 31/ series 2 which is nonspecific but was likely present on the prior exam.  Lateral segment left liver lobe heterogeneous calcifications are at the site of a dominant mass on the prior exam. Likely the sequelae of prior chemoembolization.  Normal spleen, stomach, left adrenal gland. Mild right adrenal nodularity is new and measures 1.4 cm on image 25/ series 2. Twenty-eight HU.  Moderate pancreatic atrophy. Cholelithiasis without acute cholecystitis or biliary ductal dilatation.  Scarred upper pole right kidney. No renal calculi or hydronephrosis. No hydroureter or ureteric calculi.  Soft tissue mass is identified within the periaortic and pericaval space. This is difficult to differentiate from the surrounding vasculature due to lack of IV contrast. The conglomerate of mass and vessels measures 6.5 x 5.5 cm on image 36/series 2. This continues in the retrocaval/ retroaortic space more inferiorly.  Underline aortic atherosclerosis.  No retrocrural adenopathy.  Scattered colonic diverticula. Normal terminal ileum and appendix. Normal small bowel without abdominal ascites. No evidence of omental or peritoneal disease.  Prior pelvic sidewall node dissection. No pelvic adenopathy. Normal urinary bladder. Prostate surgically absent by report. Bilateral left greater than right fat containing inguinal hernias. No significant free fluid.   Bones/Musculoskeletal: Advanced degenerative disc disease, including at L2-3 and L5-S1. Trace L5-S1 anterolisthesis.  IMPRESSION: 1.  No urinary tract calculi or hydronephrosis. 2. Soft tissue mass within the periaortic/ pericaval space of the retroperitoneum. Suspicious for lymphoma. Metastatic disease from one of the patient's primaries is felt much less likely. Consider nonemergent PET to direct tissue sampling. These results were called by telephone at the time of interpretation on 03/07/2013 at 11:02 AM to Dr. Katheren Shams , who verbally acknowledged these results. 3. Cholelithiasis. 4. Cirrhosis with treatment changes in the liver. No convincing evidence of recurrent or residual metastasis. 5. Right adrenal nodule which is indeterminate and new since 10/13/2005. This could also be evaluated at PET.   Electronically Signed   By: Abigail Miyamoto M.D.   On: 03/07/2013 11:03    EKG Interpretation   None       MDM  Patient 79 year old Caucasian male with past medical history nephrolithiasis comes emergency department today with right flank pain. Physical exam as above. Initial workup included a UA, lactic acid, CBC, CMP, and a lipase. With benign abdominal exam and no history of worsening of symptoms after eating doubt acute cholecystitis. Benign abdominal exam doubt appendicitis or pancreatitis. UA was negative. Lactic acid 1.98. CBC had hemoglobin of 12.3 otherwise unremarkable. CMP had glucose of 125 otherwise unremarkable. Lipase was 27. Result doubt urinary tract infection.  With low suspicion for cholecystitis based on history and normal labs and this was felt to likely be a kidney stone as results noncontrasted CT scan was ordered. CT scan demonstrated no urinary calculi or hydronephrosis there was a soft tissue mass in the retroperitoneum suspicious for lymphoma was felt to metastasis was unlikely this is likely a primary cancer. This is likely the cause the patient's pain and symptoms. His pain was  controlled in the emergency department with 2 mg of Dilaudid and 1 tablet of  Percocet. Was felt to be stable for discharge. He was informed of this diagnosis. He already has oncologist that he sees as needed. He was instructed to followup with Dr. Marcelline Mates as soon as possible retroperitoneal mass. He was instructed to followup with his primary care physician as needed for pain control. She was provided with a prescription for Percocet in the emergency department. He was instructed to return to emergency department developed worsening pain, or inability to tolerate by mouth. Patient's best understanding. He was discharged in stable condition. Labs and imaging reviewed by myself and considered and medical decision-making. Imaging was interpreted radiology. He was discussed with attending Dr. Tawnya Crook.   1. Retroperitoneal mass   2. Right flank pain       Katheren Shams, MD 03/07/13 [phone]

## 2013-03-07 NOTE — Telephone Encounter (Signed)
Pat with Dr Shelia Media calling in to let us know that he would like Dr Jana Hakim to see patient ASAP regarding recent visit to ER today with new abdominal mass findings. Patient also has appt with Dr [name] on Wednesday 11/19. Will provide information to Dr Jana Hakim to review and determine urgency. Fraser Din has been communicating with daughter [name] at [phone] if unable to reach patient.

## 2013-03-07 NOTE — ED Notes (Signed)
Pt and family reports having intermittent right side lower back pain for 3-4 weeks. Has also been having RUQ pain and has been to pcp, had [place] scheduled today. Reports hx of kidney stones and also had lower back pain at that time. Reports difficulty urinating this am.

## 2013-03-08 ENCOUNTER — Other Ambulatory Visit: Payer: Self-pay | Admitting: Oncology

## 2013-03-08 ENCOUNTER — Telehealth: Payer: Self-pay | Admitting: *Deleted

## 2013-03-08 ENCOUNTER — Other Ambulatory Visit: Payer: Self-pay | Admitting: *Deleted

## 2013-03-08 ENCOUNTER — Telehealth: Payer: Self-pay | Admitting: Oncology

## 2013-03-08 DIAGNOSIS — C8299 Follicular lymphoma, unspecified, extranodal and solid organ sites: Secondary | ICD-10-CM

## 2013-03-08 DIAGNOSIS — D47Z9 Other specified neoplasms of uncertain behavior of lymphoid, hematopoietic and related tissue: Secondary | ICD-10-CM

## 2013-03-08 NOTE — Telephone Encounter (Signed)
S/w dtr re appts for 11/26 and 12/1. Per dtr pet scan being done @ [facility] as they are able to get it done before [facility].

## 2013-03-08 NOTE — Telephone Encounter (Signed)
Rec'd call from [place] in scheduling. Appt for STAT PET scan not available until 11/26. [name] discussed this with daughter [name] and they are willing to go to [facility] for test if necessary. [facility] at [phone]. Orders/insurance faxed to [phone]. Patient to arrive on Monday 03/11/13 at 0930am. Patient is diabetic, may have small bacon, eggs water breakfast before 530am. All information given to daughter [name].

## 2013-03-08 NOTE — Progress Notes (Signed)
Unable to get PET scan done at [facility], will reorder for [place] and spoke to [place]. Date and time, 03/11/13 @ 930 for 1000am scan. Notified Pre-Auth dept to change facility. New order to be entered and faxed to [phone], attn Terri. Will have CD for patient to take with him to bring back to our office and surgeon visit for comparison of [place] [facility] on 03/07/13.

## 2013-03-08 NOTE — Telephone Encounter (Signed)
Attempted to call patient to discuss new findings and plans per Dr Jana Hakim. No answer, left message that I would call and discuss with daughter [name]. Orders yesterday for PET scan and follow up appt. [name] states they will keep appt with surgeon for the planning of the biopsy. [name] request we call her for appts, she will be assisting father to appts.

## 2013-03-09 NOTE — ED Provider Notes (Signed)
Medical screening examination/treatment/procedure(s) were conducted as a shared visit with resident-physician practitioner(s) and myself.  I personally evaluated the patient during the encounter.  Pt is a 79 y.o. male with pmhx as above presenting with R flank pain/ back pain.  Pt PT, VSS, pt in NAD.  Abdominal exam benign. Pt found to have soft tissue mass in the retroperitoneum concerning for lymphoma.  Pt & family made aware, they have already called his oncologist for a f/u appt and will also f/u with PCP.  Pt given return precautions for worsening symptoms. At time of d/c, he is well-appearing, eating a hamburger & french fries.     Neta Ehlers, MD 03/09/13 [phone]

## 2013-03-11 ENCOUNTER — Ambulatory Visit: Payer: Self-pay

## 2013-03-13 ENCOUNTER — Ambulatory Visit (INDEPENDENT_AMBULATORY_CARE_PROVIDER_SITE_OTHER): Payer: Medicare Other | Admitting: Surgery

## 2013-03-13 ENCOUNTER — Encounter (INDEPENDENT_AMBULATORY_CARE_PROVIDER_SITE_OTHER): Payer: Self-pay | Admitting: Surgery

## 2013-03-13 ENCOUNTER — Other Ambulatory Visit: Payer: Self-pay | Admitting: Oncology

## 2013-03-13 VITALS — BP 116/72 | HR 64 | Temp 98.0°F | Resp 14 | Ht 68.0 in | Wt 192.8 lb

## 2013-03-13 DIAGNOSIS — K8010 Calculus of gallbladder with chronic cholecystitis without obstruction: Secondary | ICD-10-CM | POA: Insufficient documentation

## 2013-03-13 DIAGNOSIS — R590 Localized enlarged lymph nodes: Secondary | ICD-10-CM | POA: Insufficient documentation

## 2013-03-13 DIAGNOSIS — R599 Enlarged lymph nodes, unspecified: Secondary | ICD-10-CM

## 2013-03-13 NOTE — Progress Notes (Signed)
General Surgery [facility]  Chief Complaint  Patient presents with  . New Evaluation    eval RUQ pain & retroperitoneal tumor - patient is self-referred; oncologist is Dr. Gunnar Bulla Magrinat; primary care is Dr. [name]    HISTORY: Patient is a 79 year old male known to my practice. Patient has a complex past medical history. Patient has a history of prostate cancer. He has a history of lymphoma involving the liver. He has been treated with chemotherapy and was discharged from the medical oncology practice 2 years ago.  Approximately one month ago the patient developed right upper quadrant abdominal pain associated with nausea and vomiting. This radiated to the back. Episode lasted for approximately 4 days and then resolved. Patient has noted intolerance of fatty foods causing right upper quadrant discomfort and nausea. He denies any history of jaundice. He denies fevers or chills.  CT scan of abdomen and pelvis was obtained by his primary care physician. This does show cholelithiasis. It demonstrates a calcified mass in the medial segment of the left lobe of the liver consistent with his previous history of lymphoma. Also noted is retroperitoneal lymphadenopathy. Patient subsequently underwent a PET scan at [facility]. This shows activity in the retroperitoneal mass as well as in the right adrenal gland and in the lungs felt to be related to recurrent lymphoma.  Patient presents today for evaluation and further recommendations. He is scheduled to see his medical oncologist on 03/25/2013.  Past Medical History  Diagnosis Date  . Diabetes mellitus   . Hypertension   . Cancer of liver   . Kidney stone   . Prostate cancer   . Skin cancer   . Arthritis   . Anemia   . GERD (gastroesophageal reflux disease)     Current Outpatient Prescriptions  Medication Sig Dispense Refill  . amLODipine (NORVASC) 5 MG tablet Take 5 mg by mouth daily.      Marland Kitchen  aspirin 81 MG tablet Take 81 mg by mouth daily.      Marland Kitchen atenolol (TENORMIN) 100 MG tablet Take 100 mg by mouth daily.      Marland Kitchen atorvastatin (LIPITOR) 10 MG tablet Take 10 mg by mouth daily.      . cetirizine (ZYRTEC) 10 MG tablet Take 10 mg by mouth daily.      . Cyanocobalamin (VITAMIN B 12 PO) Take by mouth.      . ezetimibe (ZETIA) 10 MG tablet Take 10 mg by mouth daily.      . FENOFIBRATE PO Take 160 mg by mouth 1 day or 1 dose.      . gabapentin (NEURONTIN) 300 MG capsule Take 300 mg by mouth 2 (two) times daily.      . hydrochlorothiazide (HYDRODIURIL) 25 MG tablet Take 25 mg by mouth daily.      . insulin NPH (HUMULIN N,NOVOLIN N) 100 UNIT/ML injection Inject 34-36 Units into the skin. 36 units the morning and 34 after supper      . lisinopril (PRINIVIL,ZESTRIL) 40 MG tablet Take 40 mg by mouth daily.      . metFORMIN (GLUCOPHAGE) 1000 MG tablet Take 1,000 mg by mouth 2 (two) times daily with a meal.      . Multiple Vitamins-Minerals (MULTIVITAMIN WITH MINERALS) tablet Take 1 tablet by mouth daily.      Marland Kitchen oxyCODONE-acetaminophen (PERCOCET/ROXICET) 5-325 MG per tablet Take 2 tablets by mouth every 4 (four) hours as needed for severe pain.  40 tablet  0  . sertraline (ZOLOFT) 100 MG tablet Take 100 mg by mouth daily.       No current facility-administered medications for this visit.    Allergies  Allergen Reactions  . Niacin Other (See Comments)    headaches    Family History  Problem Relation Age of Onset  . Heart disease Father     History   Social History  . Marital Status: Married    Spouse Name: N/A    Number of Children: N/A  . Years of Education: N/A   Social History Main Topics  . Smoking status: Never Smoker   . Smokeless tobacco: Never Used  . Alcohol Use: No  . Drug Use: No  . Sexual Activity: None   Other Topics Concern  . None   Social History Narrative  . None    REVIEW OF SYSTEMS - PERTINENT POSITIVES ONLY: Intermittent right upper quadrant  abdominal pain. Pain radiating to the right flank. Nausea and fatty food intolerance. Recent weight loss.  EXAM: Filed Vitals:   03/13/13 1015  BP: 116/72  Pulse: 64  Temp: 98 F (36.7 C)  Resp: 14    HEENT: normocephalic; pupils equal and reactive; sclerae clear; dentition good; mucous membranes moist NECK:  No palpable nodules in the thyroid bed; symmetric on extension; no palpable anterior or posterior cervical lymphadenopathy; no supraclavicular masses; no tenderness CHEST: clear to auscultation bilaterally without rales, rhonchi, or wheezes CARDIAC: regular rate and rhythm without significant murmur; peripheral pulses are full ABDOMEN: soft without distension; bowel sounds present; no mass; no hepatosplenomegaly; no hernia EXT:  non-tender with mild left lower extremity edema; no deformity NEURO: no gross focal deficits; no sign of tremor   LABORATORY RESULTS: See [facility] (CHL-Epic) for most recent results  RADIOLOGY RESULTS: See [facility] (CHL-Epic) for most recent results  IMPRESSION: #1 symptomatic cholelithiasis, probable chronic cholecystitis #2 retroperitoneal lymphadenopathy concerning for lymphoma #3 personal history of prostate cancer #4 personal history of lymphoma involving the liver  PLAN: I discussed with the patient, his wife, and his daughter the above findings. I provided them with copies of his PET scan report. There are 2 clinical issues that we need to address. The first is the possibility of recurrent lymphoma involving the retroperitoneum and right adrenal gland. The second issue is his symptomatic cholelithiasis.  We will arrange for consultation with interventional radiology for percutaneous biopsy of the retroperitoneal lymphadenopathy. Hopefully the pathology results will be available prior to his office visit with his oncologist on December 1.  Once the situation with the possible recurrent lymphoma is clarified, we will consider  operative intervention for cholecystectomy for treatment of symptomatic cholelithiasis and chronic cholecystitis.  Earnstine Regal, MD, [facility]  Primary Care Physician: Horatio Pel, MD

## 2013-03-13 NOTE — Patient Instructions (Signed)
  [facility]  LAPAROSCOPIC SURGERY - POST-OP INSTRUCTIONS  Always review your discharge instruction sheet given to you by the facility where your surgery was performed.  A prescription for pain medication may be given to you upon discharge.  Take your pain medication as prescribed.  If narcotic pain medicine is not needed, then you may take acetaminophen (Tylenol) or ibuprofen (Advil) as needed.  Take your usually prescribed medications unless otherwise directed.  If you need a refill on your pain medication, please contact your pharmacy.  They will contact our office to request authorization. Prescriptions will not be filled after 5 P.M. or on weekends.  You should follow a light diet the first few days after arrival home, such as soup and crackers or toast.  Be sure to include plenty of fluids daily.  Most patients will experience some swelling and bruising in the area of the incisions.  Ice packs will help.  Swelling and bruising can take several days to resolve.   It is common to experience some constipation if taking pain medication after surgery.  Increasing fluid intake and taking a stool softener (such as Colace) will usually help or prevent this problem from occurring.  A mild laxative (Milk of Magnesia or Miralax) should be taken according to package instructions if there are no bowel movements after 48 hours.  Unless discharge instructions indicate otherwise, you may remove your bandages 24-48 hours after surgery, and you may shower at that time.  You may have steri-strips (small skin tapes) in place directly over the incision.  These strips should be left on the skin for 7-10 days.  If your surgeon used skin glue on the incision, you may shower in 24 hours.  The glue will flake off over the next 2-3 weeks.  Any sutures or staples will be removed at the office during your follow-up visit.  ACTIVITIES:  You may resume regular (light) daily activities beginning the  next day-such as daily self-care, walking, climbing stairs-gradually increasing activities as tolerated.  You may have sexual intercourse when it is comfortable.  Refrain from any heavy lifting or straining until approved by your doctor.  You may drive when you are no longer taking prescription pain medication, you can comfortably wear a seatbelt, and you can safely maneuver your car and apply brakes.  You should see your doctor in the office for a follow-up appointment approximately 2-3 weeks after your surgery.  Make sure that you call for this appointment within a day or two after you arrive home to insure a convenient appointment time.  WHEN TO CALL YOUR DOCTOR: 1. Fever over 101.0 2. Inability to urinate 3. Continued bleeding from incision 4. Increased pain, redness, or drainage from the incision 5. Increasing abdominal pain  The clinic staff is available to answer your questions during regular business hours.  Please don't hesitate to call and ask to speak to one of the nurses for clinical concerns.  If you have a medical emergency, go to the nearest emergency room or call 911.  A surgeon from [facility] is always on call for the hospital.  Michael Signer M. Yaeli Hartung, MD, FACS [facility] Office: [phone] Toll Free:  [phone] FAX [phone]  Web site: [email or web address] 

## 2013-03-14 ENCOUNTER — Encounter (INDEPENDENT_AMBULATORY_CARE_PROVIDER_SITE_OTHER): Payer: Self-pay

## 2013-03-15 ENCOUNTER — Other Ambulatory Visit (HOSPITAL_COMMUNITY): Payer: Self-pay | Admitting: Radiology

## 2013-03-18 ENCOUNTER — Encounter (HOSPITAL_COMMUNITY): Payer: Self-pay | Admitting: Pharmacy Technician

## 2013-03-19 ENCOUNTER — Ambulatory Visit (HOSPITAL_COMMUNITY)
Admission: RE | Admit: 2013-03-19 | Discharge: 2013-03-19 | Disposition: A | Discharge status: Home / Self Care | Payer: Medicare Other | Source: Ambulatory Visit | Attending: Surgery | Admitting: Surgery

## 2013-03-19 ENCOUNTER — Encounter (HOSPITAL_COMMUNITY): Payer: Self-pay

## 2013-03-19 DIAGNOSIS — I10 Essential (primary) hypertension: Secondary | ICD-10-CM | POA: Insufficient documentation

## 2013-03-19 DIAGNOSIS — D649 Anemia, unspecified: Secondary | ICD-10-CM | POA: Insufficient documentation

## 2013-03-19 DIAGNOSIS — E119 Type 2 diabetes mellitus without complications: Secondary | ICD-10-CM | POA: Insufficient documentation

## 2013-03-19 DIAGNOSIS — C8589 Other specified types of non-Hodgkin lymphoma, extranodal and solid organ sites: Secondary | ICD-10-CM | POA: Insufficient documentation

## 2013-03-19 DIAGNOSIS — R590 Localized enlarged lymph nodes: Secondary | ICD-10-CM

## 2013-03-19 DIAGNOSIS — K219 Gastro-esophageal reflux disease without esophagitis: Secondary | ICD-10-CM | POA: Insufficient documentation

## 2013-03-19 DIAGNOSIS — C8590 Non-Hodgkin lymphoma, unspecified, unspecified site: Secondary | ICD-10-CM | POA: Insufficient documentation

## 2013-03-19 DIAGNOSIS — Z8505 Personal history of malignant neoplasm of liver: Secondary | ICD-10-CM | POA: Insufficient documentation

## 2013-03-19 DIAGNOSIS — Z8546 Personal history of malignant neoplasm of prostate: Secondary | ICD-10-CM | POA: Insufficient documentation

## 2013-03-19 DIAGNOSIS — M129 Arthropathy, unspecified: Secondary | ICD-10-CM | POA: Insufficient documentation

## 2013-03-19 DIAGNOSIS — Z85828 Personal history of other malignant neoplasm of skin: Secondary | ICD-10-CM | POA: Insufficient documentation

## 2013-03-19 DIAGNOSIS — Z01812 Encounter for preprocedural laboratory examination: Secondary | ICD-10-CM | POA: Insufficient documentation

## 2013-03-19 HISTORY — DX: Non-Hodgkin lymphoma, unspecified, unspecified site: C85.90

## 2013-03-19 LAB — CBC
MCH: 28.5 pg (ref 26.0–34.0)
MCHC: 34.5 g/dL (ref 30.0–36.0)
MCV: 82.6 fL (ref 78.0–100.0)
Platelets: 207 10*3/uL (ref 150–400)

## 2013-03-19 LAB — PROTIME-INR: Prothrombin Time: 14.3 seconds (ref 11.6–15.2)

## 2013-03-19 LAB — GLUCOSE, CAPILLARY

## 2013-03-19 MED ORDER — HYDROCODONE-ACETAMINOPHEN 5-325 MG PO TABS
1.0000 | ORAL_TABLET | ORAL | Status: DC | PRN
  Filled 2013-03-19: qty 2

## 2013-03-19 MED ORDER — MIDAZOLAM HCL 2 MG/2ML IJ SOLN
INTRAMUSCULAR | Status: AC
  Filled 2013-03-19: qty 4

## 2013-03-19 MED ORDER — SODIUM CHLORIDE 0.9 % IV SOLN
Freq: Once | INTRAVENOUS | Status: AC
  Administered 2013-03-19: 08:00:00 via INTRAVENOUS

## 2013-03-19 MED ORDER — MIDAZOLAM HCL 2 MG/2ML IJ SOLN
INTRAMUSCULAR | Status: DC | PRN
  Administered 2013-03-19 (×2): 1 mg via INTRAVENOUS

## 2013-03-19 MED ORDER — FENTANYL CITRATE 0.05 MG/ML IJ SOLN
INTRAMUSCULAR | Status: DC | PRN
  Administered 2013-03-19: 50 ug via INTRAVENOUS
  Administered 2013-03-19 (×2): 25 ug via INTRAVENOUS

## 2013-03-19 MED ORDER — LIDOCAINE HCL 1 % IJ SOLN
INTRAMUSCULAR | Status: AC
  Filled 2013-03-19: qty 10

## 2013-03-19 MED ORDER — FENTANYL CITRATE 0.05 MG/ML IJ SOLN
INTRAMUSCULAR | Status: AC
  Filled 2013-03-19: qty 4

## 2013-03-19 NOTE — H&P (Signed)
[name] is an 79 y.o. male.   Chief Complaint: Scheduled for Retroperitoneal mass biopsy Pt with hx of prostate ca and lymphoma Pt was seen for abd pain and work up/CT revealed RP mass +PET: RP mass; R adrenal; lungs Scheduled now for bx HPI: DM; HTN; Lymphoma; Prostate Ca; skin ca; GERD  Past Medical History  Diagnosis Date  . Diabetes mellitus   . Hypertension   . Cancer of liver   . Kidney stone   . Prostate cancer   . Skin cancer   . Arthritis   . Anemia   . GERD (gastroesophageal reflux disease)   . Lymphoma     Past Surgical History  Procedure Laterality Date  . Prostatectomy    . Kidney stone surgery    . Rotator cuff repair    . Ankle surgery      Family History  Problem Relation Age of Onset  . Heart disease Father    Social History:  reports that he has never smoked. He has never used smokeless tobacco. He reports that he does not drink alcohol or use illicit drugs.  Allergies:  Allergies  Allergen Reactions  . Niacin Other (See Comments)    headaches     (Not in a hospital admission)  No results found for this or any previous visit (from the past 48 hour(s)). No results found.  Review of Systems  Constitutional: Negative for fever and weight loss.  Respiratory: Negative for shortness of breath.   Cardiovascular: Negative for chest pain.  Gastrointestinal: Positive for abdominal pain. Negative for nausea and vomiting.  Neurological: Negative for dizziness, weakness and headaches.    Blood pressure 126/65, pulse 54, temperature 98 F (36.7 C), temperature source Tympanic, resp. rate 18, SpO2 98.00%. Physical Exam  Constitutional: He is oriented to person, place, and time. He appears well-nourished.  Cardiovascular: Normal rate, regular rhythm and normal heart sounds.   No murmur heard. Respiratory: Effort normal and breath sounds normal. He has no wheezes.  GI: Soft. Bowel sounds are normal. There is no tenderness.  Musculoskeletal:  Normal range of motion.  Neurological: He is alert and oriented to person, place, and time.  Skin: Skin is warm and dry.  Psychiatric: He has a normal mood and affect. His behavior is normal. Judgment and thought content normal.     Assessment/Plan Pt with hx lymphoma and prostate ca Developed abd pain CT shows RP mass +PET: RP mass; R adrenal and lungs Scheduled now for RP mass bx Pt aware of procedure benefits and risks and agreeable to proceed Consent signed and in chart  [name] A 03/19/2013, 8:08 AM

## 2013-03-19 NOTE — Procedures (Signed)
Interventional Radiology Procedure Note  Procedure: CT guided bx of periaortic retroperitoneal mass. Complications: None Recommendations: - Bedrest x 2 hrs - ADAT - Path pending  Signed,  Criselda Peaches, MD [facility]

## 2013-03-19 NOTE — Progress Notes (Signed)
D/C home with wife; biopsy site unremarkable; no c/o

## 2013-03-20 ENCOUNTER — Other Ambulatory Visit: Payer: Medicare Other | Admitting: Lab

## 2013-03-20 ENCOUNTER — Telehealth (HOSPITAL_COMMUNITY): Payer: Self-pay | Admitting: *Deleted

## 2013-03-20 ENCOUNTER — Other Ambulatory Visit (HOSPITAL_BASED_OUTPATIENT_CLINIC_OR_DEPARTMENT_OTHER): Payer: Medicare Other

## 2013-03-20 DIAGNOSIS — C8299 Follicular lymphoma, unspecified, extranodal and solid organ sites: Secondary | ICD-10-CM

## 2013-03-20 DIAGNOSIS — Z87898 Personal history of other specified conditions: Secondary | ICD-10-CM

## 2013-03-20 LAB — COMPREHENSIVE METABOLIC PANEL (CC13)
ALT: 23 U/L (ref 0–55)
AST: 19 U/L (ref 5–34)
Albumin: 3.9 g/dL (ref 3.5–5.0)
Anion Gap: 12 mEq/L — ABNORMAL HIGH (ref 3–11)
BUN: 17.2 mg/dL (ref 7.0–26.0)
CO2: 24 mEq/L (ref 22–29)
Calcium: 9.5 mg/dL (ref 8.4–10.4)
Chloride: 103 mEq/L (ref 98–109)
Creatinine: 0.9 mg/dL (ref 0.7–1.3)
Glucose: 166 mg/dl — ABNORMAL HIGH (ref 70–140)
Potassium: 4 mEq/L (ref 3.5–5.1)
Sodium: 139 mEq/L (ref 136–145)
Total Protein: 7.3 g/dL (ref 6.4–8.3)

## 2013-03-20 LAB — CBC WITH DIFFERENTIAL/PLATELET
Basophils Absolute: 0 10*3/uL (ref 0.0–0.1)
EOS%: 3.5 % (ref 0.0–7.0)
Eosinophils Absolute: 0.3 10*3/uL (ref 0.0–0.5)
HCT: 36.4 % — ABNORMAL LOW (ref 38.4–49.9)
HGB: 12 g/dL — ABNORMAL LOW (ref 13.0–17.1)
MCH: 27 pg — ABNORMAL LOW (ref 27.2–33.4)
MCHC: 33 g/dL (ref 32.0–36.0)
MCV: 82 fL (ref 79.3–98.0)
MONO%: 6 % (ref 0.0–14.0)
NEUT#: 5.8 10*3/uL (ref 1.5–6.5)
NEUT%: 81.6 % — ABNORMAL HIGH (ref 39.0–75.0)
lymph#: 0.6 10*3/uL — ABNORMAL LOW (ref 0.9–3.3)

## 2013-03-20 LAB — LACTATE DEHYDROGENASE (CC13): LDH: 157 U/L (ref 125–245)

## 2013-03-22 LAB — CEA: CEA: 1.2 ng/mL (ref 0.0–5.0)

## 2013-03-22 LAB — BETA 2 MICROGLOBULIN, SERUM: Beta-2 Microglobulin: 3 mg/L — ABNORMAL HIGH (ref 1.01–1.73)

## 2013-03-23 ENCOUNTER — Other Ambulatory Visit: Payer: Self-pay | Admitting: Oncology

## 2013-03-25 ENCOUNTER — Ambulatory Visit (HOSPITAL_BASED_OUTPATIENT_CLINIC_OR_DEPARTMENT_OTHER): Payer: Medicare Other | Admitting: Oncology

## 2013-03-25 ENCOUNTER — Telehealth: Payer: Self-pay | Admitting: Oncology

## 2013-03-25 VITALS — BP 130/67 | HR 71 | Temp 98.4°F | Resp 18 | Ht 68.0 in | Wt 192.5 lb

## 2013-03-25 DIAGNOSIS — C8589 Other specified types of non-Hodgkin lymphoma, extranodal and solid organ sites: Secondary | ICD-10-CM

## 2013-03-25 DIAGNOSIS — C8583 Other specified types of non-Hodgkin lymphoma, intra-abdominal lymph nodes: Secondary | ICD-10-CM

## 2013-03-25 DIAGNOSIS — I2510 Atherosclerotic heart disease of native coronary artery without angina pectoris: Secondary | ICD-10-CM

## 2013-03-25 NOTE — Telephone Encounter (Signed)
, °

## 2013-03-25 NOTE — Progress Notes (Signed)
ID: [name] OB: 02/03/1932  MR#: [id number]  [id number]  PCP: Horatio Pel, MD GYN:   SU: [name] OTHER MD: [name], [name]  CHIEF COMPLAINT:  "I think my lymphoma is back"  HISTORY OF PRESENT ILLNESS: From my of original intake note 06/28/2004:  The patient developed right upper quadrant pain last year and he had an ultrasound April 5th which showed some gallstones without evidence of cholecystitis or ductal dilatation.  However, there were multiple lesions in the liver which could not be assessed further.  Accordingly on July 31, 2003, [name], showed multiple liver masses, more than 25, most over 1 cm, the largest being in the inferior right lobe, measuring 4.2 cm. There was also a small mass in the spleen.  CT of the pelvis was unremarkable.   The patient had a biopsy of the liver August 01, 2003.  The report says only that it was a lesion in the right lobe of the liver.  Presumably this was the largest lesion present.  The final pathology [id number] and [phone]) showed only cirrhosis.    The patient has been followed by [name], and a repeat CT scan of the abdomen and pelvis was obtained May 18, 2004.  Many of the liver lesions seen previously had actually decreased in size.  However, [name], [name], a second ultrasound-guided biopsy was performed 06/11/04. This was a lesion deep in the left lobe of the liver and therefore, I would think not the same one previously biopsied which was in the right liver. The pathology this time [id number]) shows a poorly differentiated neuroendocrine  carcinoma which was positive for chromogranin A, negative for synaptophysin, thyroid transcription factor, a variety of cytokeratins, [name], alpha-fetoprotein and COX-2."  [name] was subsequently evaluated at [facility] by Dr. Otelia Limes and repeat liver biopsy and review of the earlier biopsy here showed a primary hepatic lymphoma. The patient was treated with R-CHOP as detailed below and achieved a complete response. He received maintenance rituximab until 2008  More recently the patient has been complaining of worsening back pain. It felt "like a kidney stone".  INTERVAL HISTORY:  REVIEW OF SYSTEMS:  PAST MEDICAL HISTORY: Past Medical History  Diagnosis Date   Diabetes mellitus    Hypertension    Cancer of liver    Kidney stone    Prostate cancer    Skin cancer    Arthritis    Anemia    GERD (gastroesophageal reflux disease)    Lymphoma   Significant for prostate cancer, the patient undergoing prostatectomy January 2001 under [facility] for a Gleason 7, [name], minor coronary artery disease, hypertension, hypertriglyceridemia, cholelithiasis, colon polyps, cirrhosis by biopsy, diabetes, [name], history of nephrolithiasis, status post right renal surgery and history of left rotator cuff repair under [name].    PAST SURGICAL HISTORY: Past Surgical History  Procedure Laterality Date  Prostatectomy     Kidney stone surgery     Rotator cuff repair     Ankle surgery      FAMILY HISTORY Family History  Problem Relation Age of Onset   Heart disease Father   The patients father died at the age of 53 from an MI.  The patients mother died from old age at 42.  The patient is one of nine siblings.  One brother died from cancer of the  esophagus, 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, [name], a homemaker, is present today as are the patient's three daughters: [name] is an Scientist, physiological for [employer]; [name], [name]; and [name] is Environmental consultant.  Everybody lives in [place].  The patient has five grandchildren.  He is a member of [place] [place].    ADVANCED DIRECTIVES: in place   HEALTH MAINTENANCE: History  Substance Use Topics   Smoking status: Never Smoker    Smokeless tobacco: Never Used   Alcohol Use: No     Colonoscopy:  PSA: 8.01 (NOV 2014)  Bone density:  Lipid panel:  Allergies  Allergen Reactions   Niacin Other (See Comments)    headaches    Current Outpatient Prescriptions  Medication Sig Dispense Refill   amLODipine (NORVASC) 5 MG tablet Take 5 mg by mouth daily.       aspirin 81 MG tablet Take 81 mg by mouth daily.       atenolol (TENORMIN) 100 MG tablet Take 100 mg by mouth daily.       atorvastatin (LIPITOR) 20 MG tablet Take 20 mg by mouth daily.       cetirizine (ZYRTEC) 10 MG tablet Take 10 mg by mouth daily as needed for allergies.        Cyanocobalamin (VITAMIN B 12 PO) Take 2,500 mg by mouth daily.        ezetimibe (ZETIA) 10 MG tablet Take 10 mg by mouth daily.       fenofibrate 160 MG tablet Take 160 mg by mouth daily.       gabapentin (NEURONTIN) 300 MG capsule Take 300 mg by mouth 2 (two) times daily.       hydrochlorothiazide (HYDRODIURIL) 25 MG tablet Take 25 mg by mouth daily.       insulin NPH (HUMULIN N,NOVOLIN N) 100 UNIT/ML injection Inject 34-36 Units into the skin. 36 units the morning and 34 after supper        lisinopril (PRINIVIL,ZESTRIL) 40 MG tablet Take 40 mg by mouth daily.       metFORMIN (GLUCOPHAGE) 1000 MG tablet Take 1,000 mg by mouth 2 (two) times daily with a meal.       Multiple Vitamins-Minerals (MULTIVITAMIN WITH MINERALS) tablet Take 1 tablet by mouth daily.       oxyCODONE-acetaminophen (PERCOCET/ROXICET) 5-325 MG per tablet Take 1-2 tablets by mouth every 4 (four) hours as needed for severe pain.       sertraline (ZOLOFT) 100 MG tablet Take 100 mg by mouth daily.       No current facility-administered medications for this visit.    OBJECTIVE: White male in no acute distress  Filed Vitals:  03/25/13 1448  BP: 130/67  Pulse: 71  Temp: 98.4 F (36.9 C)  Resp: 18     Body mass index is 29.28 kg/(m^2).    ECOG FS:1 - Symptomatic but completely ambulatory  Ocular: Sclerae unicteric, pupils equal, round and reactive to light Ear-nose-throat: Oropharynx clear,  no thrush or other lesions  Lymphatic: No cervical or supraclavicular adenopathy; no axillary or inguinal adenopathy  Lungs no rales or rhonchi, good excursion bilaterally Heart regular rate and rhythm, no murmur appreciated Abd soft, nontender, positive bowel sounds, no masses palpated  MSK no focal spinal tenderness, no joint edema Neuro: non-focal, well-oriented,  positive  affect  LAB RESULTS:  CMP     Component Value Date/Time   NA 139 03/20/2013 0916   NA 137 03/07/2013 0829   K 4.0 03/20/2013 0916   K 4.1 03/07/2013 0829   CL 101 03/07/2013 0829   CO2 24 03/20/2013 0916   CO2 25 03/07/2013 0829   GLUCOSE 166* 03/20/2013 0916   GLUCOSE 125* 03/07/2013 0829   BUN 17.2 03/20/2013 0916   BUN 16 03/07/2013 0829   CREATININE 0.9 03/20/2013 0916   CREATININE 0.80 03/07/2013 0829   CALCIUM 9.5 03/20/2013 0916   CALCIUM 9.5 03/07/2013 0829   CALCIUM 11.0* 08/17/2005 0842   PROT 7.3 03/20/2013 0916   PROT 7.2 03/07/2013 0829   ALBUMIN 3.9 03/20/2013 0916   ALBUMIN 4.1 03/07/2013 0829   AST 19  03/20/2013 0916   AST 20 03/07/2013 0829   ALT 23 03/20/2013 0916   ALT 18 03/07/2013 0829   ALKPHOS 47 03/20/2013 0916   ALKPHOS 46 03/07/2013 0829   BILITOT 0.40 03/20/2013 0916   BILITOT 0.3 03/07/2013 0829   GFRNONAA 83* 03/07/2013 0829   GFRAA >90 03/07/2013 0829   Results for DAOUD, VANTHOF (MRN [id number]) as of 03/25/2013 18:32  Ref. Range 12/03/2009 08:35 03/29/2010 08:43 08/25/2010 09:31 03/02/2011 08:13 03/20/2013 09:16  LDH Latest Range: 125-245 U/L 135 119 151 128 157   IResults for PHILIPPOS, ROTI (MRN [id number]) as of 03/25/2013 18:32  Ref. Range 12/03/2009 08:35 03/29/2010 08:43 08/25/2010 09:31 03/02/2011 08:13 03/20/2013 09:20  Beta-2 Microglobulin Latest Range: 1.01-1.73 mg/L 3.36 (H) 2.85 (H) 2.50 (H) 2.04 (H) 3.00 (H)   No results found for this basename: SPEP, UPEP,  kappa and lambda light chains    Lab Results  Component Value Date   WBC 7.1 03/20/2013   NEUTROABS 5.8 03/20/2013   HGB 12.0* 03/20/2013   HCT 36.4* 03/20/2013   MCV 82.0 03/20/2013   PLT 191 03/20/2013      Chemistry      Component Value Date/Time   NA 139 03/20/2013 0916   NA 137 03/07/2013 0829   K 4.0 03/20/2013 0916   K 4.1 03/07/2013 0829   CL 101 03/07/2013 0829   CO2 24 03/20/2013 0916   CO2 25 03/07/2013 0829   BUN 17.2 03/20/2013 0916   BUN 16 03/07/2013 0829   CREATININE 0.9 03/20/2013 0916   CREATININE 0.80 03/07/2013 0829      Component Value Date/Time   CALCIUM 9.5 03/20/2013 0916   CALCIUM 9.5 03/07/2013 0829   CALCIUM 11.0* 08/17/2005 0842   ALKPHOS 47 03/20/2013 0916   ALKPHOS 46 03/07/2013 0829   AST 19 03/20/2013 0916   AST 20 03/07/2013 0829   ALT 23 03/20/2013 0916   ALT 18 03/07/2013 0829   BILITOT 0.40 03/20/2013 0916   BILITOT 0.3 03/07/2013 0829       No  results found for this basename: LABCA2    No components found with this basename: LABCA125     Recent Labs Lab 03/19/13 0734  INR 1.13    Urinalysis    Component Value Date/Time    COLORURINE YELLOW 03/07/2013 0929   APPEARANCEUR CLEAR 03/07/2013 0929   LABSPEC 1.020 03/07/2013 0929   PHURINE 5.5 03/07/2013 0929   GLUCOSEU NEGATIVE 03/07/2013 0929   HGBUR NEGATIVE 03/07/2013 0929   BILIRUBINUR NEGATIVE 03/07/2013 0929   KETONESUR NEGATIVE 03/07/2013 0929   PROTEINUR NEGATIVE 03/07/2013 0929   UROBILINOGEN 0.2 03/07/2013 0929   NITRITE NEGATIVE 03/07/2013 0929   LEUKOCYTESUR NEGATIVE 03/07/2013 0929    STUDIES: Ct Abdomen Pelvis Wo Contrast  03/07/2013   CLINICAL DATA:  Back pain and right flank pain for 1 week. Dysuria. Diarrhea 1 week ago. History of prostatectomy 15 years ago. History of hepatocellular carcinoma with chemotherapy 3 years ago. Diabetes. Skin cancer.  EXAM: CT ABDOMEN AND PELVIS WITHOUT CONTRAST  TECHNIQUE: Multidetector CT imaging of the abdomen and pelvis was performed following the standard protocol without intravenous contrast.  COMPARISON:  10/13/2005  FINDINGS: Lower Chest: Bibasilar scarring or atelectasis. Mild cardiomegaly with dense coronary artery atherosclerosis. No pericardial or pleural effusion.  Abdomen/Pelvis: Cirrhosis. Scattered right hepatic lobe calcifications without well-defined residual or recurrent mass. There is a 6 mm low-density focus adjacent the gallbladder on image 31/ series 2 which is nonspecific but was likely present on the prior exam.  Lateral segment left liver lobe heterogeneous calcifications are at the site of a dominant mass on the prior exam. Likely the sequelae of prior chemoembolization.  Normal spleen, stomach, left adrenal gland. Mild right adrenal nodularity is new and measures 1.4 cm on image 25/ series 2. Twenty-eight HU.  Moderate pancreatic atrophy. Cholelithiasis without acute cholecystitis or biliary ductal dilatation.  Scarred upper pole right kidney. No renal calculi or hydronephrosis. No hydroureter or ureteric calculi.  Soft tissue mass is identified within the periaortic and pericaval space. This is  difficult to differentiate from the surrounding vasculature due to lack of IV contrast. The conglomerate of mass and vessels measures 6.5 x 5.5 cm on image 36/series 2. This continues in the retrocaval/ retroaortic space more inferiorly.  Underline aortic atherosclerosis.  No retrocrural adenopathy.  Scattered colonic diverticula. Normal terminal ileum and appendix. Normal small bowel without abdominal ascites. No evidence of omental or peritoneal disease.  Prior pelvic sidewall node dissection. No pelvic adenopathy. Normal urinary bladder. Prostate surgically absent by report. Bilateral left greater than right fat containing inguinal hernias. No significant free fluid.  Bones/Musculoskeletal: Advanced degenerative disc disease, including at L2-3 and L5-S1. Trace L5-S1 anterolisthesis.  IMPRESSION: 1.  No urinary tract calculi or hydronephrosis. 2. Soft tissue mass within the periaortic/ pericaval space of the retroperitoneum. Suspicious for lymphoma. Metastatic disease from one of the patient's primaries is felt much less likely. Consider nonemergent PET to direct tissue sampling. These results were called by telephone at the time of interpretation on 03/07/2013 at 11:02 AM to Dr. Katheren Shams , who verbally acknowledged these results. 3. Cholelithiasis. 4. Cirrhosis with treatment changes in the liver. No convincing evidence of recurrent or residual metastasis. 5. Right adrenal nodule which is indeterminate and new since 10/13/2005. This could also be evaluated at PET.   Electronically Signed   By: Abigail Miyamoto M.D.   On: 03/07/2013 11:03   Ct Foot Right Wo Contrast  03/04/2013   CLINICAL DATA:  Right foot pain and swelling for 7-10 days. History  of Charcot change.  EXAM: CT OF THE RIGHT FOOT WITHOUT CONTRAST  TECHNIQUE: Multidetector CT imaging was performed according to the standard protocol. Multiplanar CT image reconstructions were also generated.  COMPARISON:  None.  FINDINGS: No fracture or dislocation  is identified. There is extensive joint space narrowing with subchondral cyst formation and osteophytosis throughout the midfoot. Scattered small bony fragments are also identified and best seen about the 2nd, 3rd and 4th tarsometatarsal joints. No bony destructive change to suggest osteomyelitis is present. No foreign body or soft tissue gas collection is seen. No fluid collection is identified. Extensive atherosclerotic vascular disease is noted.  IMPRESSION: No acute or focal abnormality.  Findings in the midfoot consistent with advanced osteoarthritis and mild to moderate neuropathic change are identified as described above.  Atherosclerosis.   Electronically Signed   By: Inge Rise M.D.   On: 03/04/2013 15:49   Ct Biopsy  03/19/2013   CLINICAL DATA:  79 year old male with a history of a both a pancreatic adenocarcinoma, and lymphoma now with an enlarging hypermetabolic (PET positive) para aortic retroperitoneal mass. CT-guided biopsy is requested to facilitate tissue diagnosis.  EXAM: CT GUIDED CORE BIOPSY OF PARA AORTIC RETROPERITONEAL MASS  ANESTHESIA/SEDATION: Two mg IV Versed; 100 mcg IV Fentanyl  Total Moderate Sedation Time: 2 minutes.  PROCEDURE: The procedure risks, benefits, and alternatives were explained to the patient. Questions regarding the procedure were encouraged and answered. The patient understands and consents to the procedure.  The right back was prepped with Betadinein a sterile fashion, and a sterile drape was applied covering the operative field. A sterile gown and sterile gloves were used for the procedure. Local anesthesia was provided with 1% Lidocaine.  A planning axial CT scan was performed. The periaortic mass was successfully identified. A suitable skin entry site was selected and marked. Local anesthesia was attained by infiltration with 1% lidocaine. A small dermatotomy was made. Using intermittent CT guidance, a 15 cm 17 gauge trocar needle was carefully advanced  through the right back, retroperitoneal space and into the margin of the periaortic mass. Multiple 18 gauge core biopsies were then coaxially obtained using the 18 gauge BioPince automated biopsy device.  Post biopsy axial CT imaging and demonstrates no evidence of immediate complication. Locules of gas are present within the biopsy site.  Complications: None immediate  FINDINGS: Successful identification of a periaortic retroperitoneal soft tissue mass.  IMPRESSION: Technically successful CT-guided biopsy of periaortic retroperitoneal mass.  Signed,  Criselda Peaches, MD  [facility]  [facility]   Electronically Signed   By: Jacqulynn Cadet M.D.   On: 03/19/2013 15:18   Patient: JASIM, MELITO Collected: 03/19/2013 Client: Lexa Accession: [id number] Received: 03/19/2013 Jacqulynn Cadet, MD DOB: 05-23-1931 Age: 45 Gender: M Reported: 03/22/2013 [address] Patient Ph: [phone] MRN #: [id number] [place], [place] [address] Visit #: [id number] Chart #: Phone:  Fax: CC: [name], MD REPORT OF SURGICAL PATHOLOGY FINAL DIAGNOSIS Diagnosis Retroperitoneal mass, biopsy - DIFFUSE LARGE B-CELL LYMPHOMA, CLINICALLY RECURRENT. - SEE ONCOLOGY TABLE. Microscopic Comment LYMPHOMA Histologic type: Non-Hodgkin lymphoma- diffuse large B-cell lymphoma Grade (if applicable): High grade. Flow cytometry: Not performed. Immunohistochemical stains: CD45, CD20, CD3, CD10, pancytokeratin. Touch preps/imprints: N/A. Comments: The biopsy consists predominately of necrotic tissue. There are a few foci of viable large highly atypical cells with irregular nuclear contours and prominent nucleoli. Immunohistochemistry reveals the cells are positive for CD45, CD20. CD3 highlights a few background T-completely T-cells. Pancytokeratin  is negative. CD10 is non-contributory due to high levels of background staining. Overall, the  morphologic and immunophenotypic findings are consistent with a diffuse large B-cell lymphoma. Given the patient's history these findings indicate recurrent disease. Vicente Males MD Pathologist, Electronic Signature (Case signed 03/22/2013)  ASSESSMENT: 79 y.o. [place] man with a diagnosis of primary hepatic non-Hodgkin's lymphoma established through liver biopsy February 2006, treated with Rituxan x9 and then CHOP x6.  All chemotherapy completed in 2007.  Maintenance Rituxan discontinued October 2008.     (1)  biopsy proven retroperitoneal recurrence documented November 2014  PLAN:  I. spent the better part of today's hour-long visit discussing Al's situation and with him and his family. I gave him a copy of the pathology report, CT scan report, and reviewed those with him in detail. He understands that recurrent lymphoma is not curable except for patients undergoing autologous transplantation after chemotherapy showing response. We discussed what is involved in an autologous transplant sufficiently that he understands it would be difficult and risky at his age. Nevertheless he is interested in exploring the possibility. He was evaluated at [facility] before, and we will ask the [facility] transplant team whether they would consider him.  If he proves not to be a transplant candidate, and then we will initiate palliative treatment. Either way he needs a bone marrow biopsy and this is being set up. I am going to see him back December 15 and I expect by that time we will have a definitive plan. Al has a good understanding of this plan and is in agreement with that. He knows to call for any problems that may develop before his next visit here.  [name], MD   03/25/2013 3:02 PM

## 2013-03-26 ENCOUNTER — Other Ambulatory Visit: Payer: Self-pay | Admitting: Oncology

## 2013-03-26 ENCOUNTER — Telehealth: Payer: Self-pay | Admitting: Oncology

## 2013-03-26 DIAGNOSIS — C8583 Other specified types of non-Hodgkin lymphoma, intra-abdominal lymph nodes: Secondary | ICD-10-CM

## 2013-03-27 ENCOUNTER — Other Ambulatory Visit: Payer: Self-pay | Admitting: Oncology

## 2013-03-27 ENCOUNTER — Other Ambulatory Visit (INDEPENDENT_AMBULATORY_CARE_PROVIDER_SITE_OTHER): Payer: Self-pay | Admitting: Surgery

## 2013-03-28 ENCOUNTER — Telehealth (INDEPENDENT_AMBULATORY_CARE_PROVIDER_SITE_OTHER): Payer: Self-pay | Admitting: Surgery

## 2013-03-28 ENCOUNTER — Other Ambulatory Visit (INDEPENDENT_AMBULATORY_CARE_PROVIDER_SITE_OTHER): Payer: Self-pay | Admitting: Surgery

## 2013-03-28 DIAGNOSIS — C8590 Non-Hodgkin lymphoma, unspecified, unspecified site: Secondary | ICD-10-CM

## 2013-03-28 DIAGNOSIS — R590 Localized enlarged lymph nodes: Secondary | ICD-10-CM

## 2013-03-28 NOTE — Telephone Encounter (Signed)
Asked to place infusion port at time of cholecystectomy by patient's oncologist, Dr. Gunnar Bulla Magrinat.  Discussed with patient's daughter, [name], this morning and she agrees to proceed.  Will discuss with patient.  Orders entered into Epic.  Plan surgery next week.  Earnstine Regal, MD, [facility] Office: [phone]

## 2013-03-29 ENCOUNTER — Inpatient Hospital Stay
Admission: RE | Admit: 2013-03-29 | Discharge: 2013-03-29 | Disposition: A | Discharge status: Home / Self Care | Payer: Self-pay | Source: Ambulatory Visit | Attending: Oncology | Admitting: Oncology

## 2013-03-29 ENCOUNTER — Other Ambulatory Visit: Payer: Self-pay | Admitting: *Deleted

## 2013-03-29 ENCOUNTER — Other Ambulatory Visit: Payer: Self-pay | Admitting: Oncology

## 2013-03-29 ENCOUNTER — Other Ambulatory Visit: Payer: Self-pay | Admitting: Radiology

## 2013-03-29 ENCOUNTER — Ambulatory Visit (HOSPITAL_COMMUNITY)
Admission: RE | Admit: 2013-03-29 | Discharge: 2013-03-29 | Disposition: A | Discharge status: Home / Self Care | Payer: Medicare Other | Source: Ambulatory Visit | Attending: Oncology | Admitting: Oncology

## 2013-03-29 ENCOUNTER — Other Ambulatory Visit (HOSPITAL_COMMUNITY): Payer: Self-pay | Admitting: Radiology

## 2013-03-29 DIAGNOSIS — C61 Malignant neoplasm of prostate: Secondary | ICD-10-CM

## 2013-03-29 DIAGNOSIS — E785 Hyperlipidemia, unspecified: Secondary | ICD-10-CM | POA: Insufficient documentation

## 2013-03-29 DIAGNOSIS — C8590 Non-Hodgkin lymphoma, unspecified, unspecified site: Secondary | ICD-10-CM

## 2013-03-29 DIAGNOSIS — I2510 Atherosclerotic heart disease of native coronary artery without angina pectoris: Secondary | ICD-10-CM | POA: Insufficient documentation

## 2013-03-29 DIAGNOSIS — I059 Rheumatic mitral valve disease, unspecified: Secondary | ICD-10-CM | POA: Insufficient documentation

## 2013-03-29 DIAGNOSIS — E119 Type 2 diabetes mellitus without complications: Secondary | ICD-10-CM | POA: Insufficient documentation

## 2013-03-29 DIAGNOSIS — C229 Malignant neoplasm of liver, not specified as primary or secondary: Secondary | ICD-10-CM

## 2013-03-29 DIAGNOSIS — C8583 Other specified types of non-Hodgkin lymphoma, intra-abdominal lymph nodes: Secondary | ICD-10-CM

## 2013-03-29 DIAGNOSIS — C8589 Other specified types of non-Hodgkin lymphoma, extranodal and solid organ sites: Secondary | ICD-10-CM | POA: Insufficient documentation

## 2013-03-29 MED ORDER — SODIUM CHLORIDE 0.9 % IV SOLN
INTRAVENOUS | Status: DC
  Administered 2013-03-29: 500 mL via INTRAVENOUS
  Filled 2013-03-29: qty 1000

## 2013-03-29 MED ORDER — PERFLUTREN LIPID MICROSPHERE
1.0000 mL | INTRAVENOUS | Status: AC | PRN
  Administered 2013-03-29: 3 mL via INTRAVENOUS
  Filled 2013-03-29: qty 10

## 2013-03-29 NOTE — Progress Notes (Signed)
  Echocardiogram 2D Echocardiogram has been performed.  Tyyne Cliett 03/29/2013, 11:42 AM

## 2013-03-29 NOTE — Progress Notes (Unsigned)
Per MD this RN called to IR/Specials dept at [facility] ( [id number] ) to inform MD wants to cancel PICC line placement for 12/8. Pt will still obtain BMBX as scheduled.  Message left on identified VM for above department.

## 2013-03-29 NOTE — OR Nursing (Signed)
[facility] to help with Definity administration for [name]. I started at 11am with a 22g needle aad 500cc NS. Administered 3cc of Definity at 1109. Ultrasound pictures obtained. Iv then removed. 25cc Ns given. IV site WNL upon removal, catheter intact. Pateint tolerated procedure well. Post BP 134-68, HR 59.

## 2013-03-30 ENCOUNTER — Other Ambulatory Visit: Payer: Self-pay | Admitting: Oncology

## 2013-04-01 ENCOUNTER — Inpatient Hospital Stay (HOSPITAL_COMMUNITY): Admission: RE | Admit: 2013-04-01 | Payer: Medicare Other | Source: Ambulatory Visit

## 2013-04-01 ENCOUNTER — Ambulatory Visit (HOSPITAL_COMMUNITY): Admission: AD | Admit: 2013-04-01 | Payer: Medicare Other | Source: Ambulatory Visit | Admitting: Oncology

## 2013-04-01 ENCOUNTER — Encounter (HOSPITAL_COMMUNITY): Payer: Self-pay

## 2013-04-01 ENCOUNTER — Ambulatory Visit (HOSPITAL_COMMUNITY)
Admission: RE | Admit: 2013-04-01 | Discharge: 2013-04-01 | Disposition: A | Discharge status: Home / Self Care | Payer: Medicare Other | Source: Ambulatory Visit | Attending: Oncology | Admitting: Oncology

## 2013-04-01 ENCOUNTER — Encounter (HOSPITAL_COMMUNITY): Payer: Self-pay | Admitting: Respiratory Therapy

## 2013-04-01 DIAGNOSIS — D649 Anemia, unspecified: Secondary | ICD-10-CM | POA: Diagnosis not present

## 2013-04-01 DIAGNOSIS — D704 Cyclic neutropenia: Secondary | ICD-10-CM | POA: Diagnosis not present

## 2013-04-01 DIAGNOSIS — I10 Essential (primary) hypertension: Secondary | ICD-10-CM | POA: Insufficient documentation

## 2013-04-01 DIAGNOSIS — Z9079 Acquired absence of other genital organ(s): Secondary | ICD-10-CM | POA: Insufficient documentation

## 2013-04-01 DIAGNOSIS — C8589 Other specified types of non-Hodgkin lymphoma, extranodal and solid organ sites: Secondary | ICD-10-CM | POA: Diagnosis not present

## 2013-04-01 DIAGNOSIS — E119 Type 2 diabetes mellitus without complications: Secondary | ICD-10-CM | POA: Insufficient documentation

## 2013-04-01 DIAGNOSIS — Z8546 Personal history of malignant neoplasm of prostate: Secondary | ICD-10-CM | POA: Diagnosis not present

## 2013-04-01 DIAGNOSIS — I2510 Atherosclerotic heart disease of native coronary artery without angina pectoris: Secondary | ICD-10-CM

## 2013-04-01 DIAGNOSIS — C8583 Other specified types of non-Hodgkin lymphoma, intra-abdominal lymph nodes: Secondary | ICD-10-CM

## 2013-04-01 LAB — PROTIME-INR: INR: 0.98 (ref 0.00–1.49)

## 2013-04-01 LAB — GLUCOSE, CAPILLARY: Glucose-Capillary: 120 mg/dL — ABNORMAL HIGH (ref 70–99)

## 2013-04-01 LAB — CBC
HCT: 36.7 % — ABNORMAL LOW (ref 39.0–52.0)
Hemoglobin: 12.4 g/dL — ABNORMAL LOW (ref 13.0–17.0)
MCV: 81.6 fL (ref 78.0–100.0)
RDW: 14.8 % (ref 11.5–15.5)
WBC: 8.5 10*3/uL (ref 4.0–10.5)

## 2013-04-01 LAB — APTT: aPTT: 30 seconds (ref 24–37)

## 2013-04-01 LAB — BONE MARROW EXAM

## 2013-04-01 MED ORDER — FENTANYL CITRATE 0.05 MG/ML IJ SOLN
INTRAMUSCULAR | Status: AC
  Filled 2013-04-01: qty 4

## 2013-04-01 MED ORDER — FENTANYL CITRATE 0.05 MG/ML IJ SOLN
INTRAMUSCULAR | Status: AC | PRN
  Administered 2013-04-01: 100 ug via INTRAVENOUS

## 2013-04-01 MED ORDER — MIDAZOLAM HCL 2 MG/2ML IJ SOLN
INTRAMUSCULAR | Status: AC
  Filled 2013-04-01: qty 4

## 2013-04-01 MED ORDER — SODIUM CHLORIDE 0.9 % IV SOLN
INTRAVENOUS | Status: DC
  Administered 2013-04-01: 20 mL/h via INTRAVENOUS

## 2013-04-01 MED ORDER — CEFAZOLIN SODIUM-DEXTROSE 2-3 GM-% IV SOLR: 2.0000 g | Freq: Once | INTRAVENOUS | Status: DC

## 2013-04-01 MED ORDER — MIDAZOLAM HCL 2 MG/2ML IJ SOLN
INTRAMUSCULAR | Status: AC | PRN
  Administered 2013-04-01: 2 mg via INTRAVENOUS

## 2013-04-01 NOTE — Procedures (Signed)
Technically successful CT guided bone marrow aspiration and biopsy of left iliac crest. No immediate complications. Awaiting pathology report.    

## 2013-04-01 NOTE — H&P (Signed)
[name] is an 79 y.o. male.   Chief Complaint: Scheduled for bone marrow biopsy Pt has hx of prostate Ca Also hx of hepatic lymphoma dx by bx 2006 Retroperitoneal mass bx 02/2013: + NHL Pt followed by [facility] MD and Dr Jana Hakim Possible stem cell transplant candidate Scheduled now for BM bx  HPI: HTN; DM; NHL; prostate ca; anemia  Past Medical History  Diagnosis Date  . Diabetes mellitus   . Hypertension   . Cancer of liver   . Kidney stone   . Prostate cancer   . Skin cancer   . Arthritis   . Anemia   . GERD (gastroesophageal reflux disease)   . Lymphoma     Past Surgical History  Procedure Laterality Date  . Prostatectomy    . Kidney stone surgery    . Rotator cuff repair    . Ankle surgery      Family History  Problem Relation Age of Onset  . Heart disease Father    Social History:  reports that he has never smoked. He has never used smokeless tobacco. He reports that he does not drink alcohol or use illicit drugs.  Allergies:  Allergies  Allergen Reactions  . Niacin Other (See Comments)    headaches     (Not in a hospital admission)  Results for orders placed during the hospital encounter of 04/01/13 (from the past 48 hour(s))  CBC     Status: Abnormal   Collection Time    04/01/13  7:52 AM      Result Value Range   WBC 8.5  4.0 - 10.5 K/uL   RBC 4.50  4.22 - 5.81 MIL/uL   Hemoglobin 12.4 (*) 13.0 - 17.0 g/dL   HCT 36.7 (*) 39.0 - 52.0 %   MCV 81.6  78.0 - 100.0 fL   MCH 27.6  26.0 - 34.0 pg   MCHC 33.8  30.0 - 36.0 g/dL   RDW 14.8  11.5 - 15.5 %   Platelets 192  150 - 400 K/uL   No results found.  Review of Systems  Constitutional: Negative for fever.  Respiratory: Negative for cough and shortness of breath.   Cardiovascular: Negative for chest pain.  Gastrointestinal: Negative for nausea and vomiting.  Neurological: Negative for weakness.    Blood pressure 154/65, pulse 60, temperature 97.9 F (36.6 C), temperature source  Oral, resp. rate 18, SpO2 99.00%. Physical Exam  Constitutional: He is oriented to person, place, and time. He appears well-developed and well-nourished.  Cardiovascular: Normal rate, regular rhythm and normal heart sounds.   No murmur heard. Respiratory: Effort normal and breath sounds normal. He has no wheezes.  GI: Soft. Bowel sounds are normal. There is no tenderness.  Musculoskeletal: Normal range of motion.  Neurological: He is alert and oriented to person, place, and time.  Skin: Skin is warm and dry.  Psychiatric: He has a normal mood and affect. His behavior is normal. Judgment and thought content normal.     Assessment/Plan Pt with hx NHL- hepatic 2006 and recurrence RP mass 2014 Pt now scheduled for BM bx- possible stem cell tx candidate Pt and wife aware of procedure benefits and risks and agreeable to proceed Consent signed and in chart  [name] 04/01/2013, 8:21 AM

## 2013-04-02 ENCOUNTER — Other Ambulatory Visit: Payer: Self-pay | Admitting: Oncology

## 2013-04-02 ENCOUNTER — Inpatient Hospital Stay
Admission: RE | Admit: 2013-04-02 | Discharge: 2013-04-02 | Disposition: A | Discharge status: Home / Self Care | Payer: Self-pay | Source: Ambulatory Visit | Attending: Oncology | Admitting: Oncology

## 2013-04-02 DIAGNOSIS — C61 Malignant neoplasm of prostate: Secondary | ICD-10-CM

## 2013-04-02 DIAGNOSIS — C8590 Non-Hodgkin lymphoma, unspecified, unspecified site: Secondary | ICD-10-CM

## 2013-04-02 DIAGNOSIS — C4490 Unspecified malignant neoplasm of skin, unspecified: Secondary | ICD-10-CM

## 2013-04-03 ENCOUNTER — Encounter (HOSPITAL_COMMUNITY)
Admission: RE | Admit: 2013-04-03 | Discharge: 2013-04-03 | Disposition: A | Discharge status: Home / Self Care | Payer: Medicare Other | Source: Ambulatory Visit | Attending: Surgery | Admitting: Surgery

## 2013-04-03 ENCOUNTER — Encounter (HOSPITAL_COMMUNITY)
Admission: RE | Admit: 2013-04-03 | Discharge: 2013-04-03 | Disposition: A | Discharge status: Home / Self Care | Payer: Medicare Other | Source: Ambulatory Visit | Attending: Anesthesiology | Admitting: Anesthesiology

## 2013-04-03 ENCOUNTER — Encounter (HOSPITAL_COMMUNITY): Payer: Self-pay

## 2013-04-03 HISTORY — DX: Shortness of breath: R06.02

## 2013-04-03 HISTORY — DX: Sleep apnea, unspecified: G47.30

## 2013-04-03 HISTORY — DX: Type 2 diabetes mellitus with diabetic neuropathy, unspecified: E11.40

## 2013-04-03 LAB — COMPREHENSIVE METABOLIC PANEL
ALT: 13 U/L (ref 0–53)
AST: 16 U/L (ref 0–37)
Albumin: 4 g/dL (ref 3.5–5.2)
Alkaline Phosphatase: 44 U/L (ref 39–117)
BUN: 19 mg/dL (ref 6–23)
Calcium: 9.8 mg/dL (ref 8.4–10.5)
GFR calc Af Amer: >90 mL/min (ref >90)
Glucose, Bld: 117 mg/dL — ABNORMAL HIGH (ref 70–99)
Potassium: 3.9 mEq/L (ref 3.5–5.1)
Sodium: 137 mEq/L (ref 135–145)
Total Bilirubin: 0.3 mg/dL (ref 0.3–1.2)
Total Protein: 7.2 g/dL (ref 6.0–8.3)

## 2013-04-03 MED ORDER — CEFAZOLIN SODIUM-DEXTROSE 2-3 GM-% IV SOLR
2.0000 g | INTRAVENOUS | Status: DC
  Filled 2013-04-03: qty 50

## 2013-04-03 NOTE — Progress Notes (Signed)
Quick Note:  These results are acceptable for scheduled surgery.  Uchechukwu Dhawan M. Clarnce Homan, MD, FACS [facility] Office: [phone]   ______ 

## 2013-04-03 NOTE — Progress Notes (Signed)
Anesthesia PAT Evaluation:  Patient is a 79 year old male scheduled for laparoscopic cholecystectomy and insertion of a Port-a-cath on 04/04/13 by Dr. Harlow Asa.  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 He underwent bone marrow biopsy on 04/01/13. He is being evaluated for to see if he is a stem call transplant candidate [facility]).  His wife is a former Furniture conservator/restorer (EMT, bed control). PCP is Dr. [name].  HEM-ONC is Dr. Lurline Del.    Echo on 03/29/13 showed: - Left ventricle: The cavity size was normal. Wall thickness was normal. Systolic function was normal. The estimated ejection fraction was in the range of 50% to 55%. Wall motion was normal; there were no regional wall motion abnormalities. Doppler parameters are consistent with abnormal left ventricular relaxation (grade 1 diastolic dysfunction). - Aortic valve: Moderately calcified annulus. Trileaflet; moderately thickened, moderately calcified leaflets. - Mitral valve: Calcified annulus. Mildly thickened leaflets. Mild regurgitation. - Left atrium: The atrium was moderately dilated. - Atrial septum: No defect or patent foramen ovale was identified.  CT of the abdomen/pelvis on 03/07/13 showed: 1. No urinary tract calculi or hydronephrosis.  2. Soft tissue mass within the periaortic/ pericaval space of the retroperitoneum. Suspicious for lymphoma. Metastatic disease from one of the patient's primaries is felt much less likely. Consider nonemergent PET to direct tissue sampling. 3. Cholelithiasis.  4. Cirrhosis with treatment changes in the liver. No convincing evidence of recurrent or residual metastasis.  5. Right adrenal nodule which is indeterminate and new since 10/13/2005. This could also be evaluated at PET.  PET scan on 03/11/13 showed large retroperitoneal aortocaval  nodal mass, compatible with lymphoma (5.6 X 7.2 cm), 2.7 X 1.4 hypermetabolic right adrenal nodule suspicious for an additional site of lymphomatous involvement, multiple tine 2-3 mm pulmonary nodules which were non-specific (consider follow-up CT in one year if high risk for bronchogenic carcinoma), atherosclerosis including left main and 3V CAD, colonic diverticulosis, cholelithiasis.  CXR on 04/03/13 showed No active cardiopulmonary disease.   EKG on 04/03/13 showed NSR, cannot rule out anterior infarct (age undetermined), non-specific ST/T wave abnormality.  Overall, I think it is stable when compared to 11/25/07 EKG.  He denies cardiac testing since the 1970's.  He denies chest pain, SOB at rest, significant DOE or LE edema.  He has no known history of CAD/MI/CHF.  He reports good DM control. His activity is currently limited by back pain and cold weather. During the warmer months he spends several hours gardening on a regular basis without CV symptoms.  Exam show a pleasant Caucasian male in NAD, lungs clear, heart RRR, no murmur noted.  No significant LE edema.    Preoperative labs from 04/03/13 and 04/01/13 noted.  AST/ALT, PLT count, PT/PTT WNL.  H/H 12.4/36.7. Glucose 117.  Cr 0.93.  History and PET scan results reviewed with anesthesiologist Dr. Chriss Driver.  Patient is asymptomatic from a CV standpoint and EKG is overall stable.  If no acute changes then anticipate that he can proceed as planned.  Patient reports Dr. Harlow Asa will decide if he will be admitted overnight following surgery tomorrow. With co-morbidities and OSA history it may be more likely that he stays overnight.  [name] [facility] Short Stay Center/Anesthesiology Phone [phone] 04/03/2013 4:07 PM

## 2013-04-03 NOTE — Pre-Procedure Instructions (Signed)
[name]  04/03/2013   Your procedure is scheduled on:  Thursday, April 04, 2013  Report to [facility] (use Main Entrance "A'') at 7:30 AM.  Call this number if you have problems the morning of surgery: [phone]   Remember:   Do not eat food or drink liquids after midnight.   Take these medicines the morning of surgery with A SIP OF WATER: amLODipine (NORVASC) 5 MG tablet, gabapentin (NEURONTIN) 300 MG capsule, sertraline (ZOLOFT) 100 MG tablet if needed:cetirizine (ZYRTEC) 10 MG tablet for allergies, oxyCODONE-acetaminophen (PERCOCET/ROXICET) 5-325 MG per tablet Stop taking Aspirin, Multivitamins and herbal medications. Do not take any NSAIDs ie: Ibuprofen, Advil, Naproxen or any medication containing Aspirin.  Do not wear jewelry, make-up or nail polish.  Do not wear lotions, powders, or perfumes. You may NOT wear deodorant.  Do not shave 48 hours prior to surgery. Men may shave face and neck.  Do not bring valuables to the hospital.  [facility] is not responsible for any belongings or valuables.               Contacts, dentures or bridgework may not be worn into surgery.  Leave suitcase in the car. After surgery it may be brought to your room.  For patients admitted to the hospital, discharge time is determined by your treatment team.               Patients discharged the day of surgery will not be allowed to drive home.  Name and phone number of your driver:   Special Instructions: Shower using CHG 2 nights before surgery and the night before surgery.  If you shower the day of surgery use CHG.  Use special wash - you have one bottle of CHG for all showers.  You should use approximately 1/3 of the bottle for each shower.   Please read over the following fact sheets that you were given: Pain Booklet, Coughing and Deep Breathing and Surgical Site Infection Prevention

## 2013-04-04 ENCOUNTER — Encounter (HOSPITAL_COMMUNITY): Payer: Medicare Other | Admitting: Vascular Surgery

## 2013-04-04 ENCOUNTER — Ambulatory Visit (HOSPITAL_COMMUNITY): Payer: Medicare Other

## 2013-04-04 ENCOUNTER — Observation Stay (HOSPITAL_COMMUNITY)
Admission: RE | Admit: 2013-04-04 | Discharge: 2013-04-05 | Disposition: A | Discharge status: Home / Self Care | Payer: Medicare Other | Source: Ambulatory Visit | Attending: Surgery | Admitting: Surgery

## 2013-04-04 ENCOUNTER — Encounter (HOSPITAL_COMMUNITY): Payer: Self-pay | Admitting: Surgery

## 2013-04-04 ENCOUNTER — Observation Stay (HOSPITAL_COMMUNITY): Payer: Medicare Other

## 2013-04-04 ENCOUNTER — Encounter (HOSPITAL_COMMUNITY)
Admission: RE | Disposition: A | Discharge status: Home / Self Care | Payer: Self-pay | Source: Ambulatory Visit | Attending: Surgery

## 2013-04-04 ENCOUNTER — Ambulatory Visit (HOSPITAL_COMMUNITY): Payer: Medicare Other | Admitting: Anesthesiology

## 2013-04-04 DIAGNOSIS — I10 Essential (primary) hypertension: Secondary | ICD-10-CM | POA: Insufficient documentation

## 2013-04-04 DIAGNOSIS — Z01810 Encounter for preprocedural cardiovascular examination: Secondary | ICD-10-CM | POA: Insufficient documentation

## 2013-04-04 DIAGNOSIS — Z01818 Encounter for other preprocedural examination: Secondary | ICD-10-CM | POA: Insufficient documentation

## 2013-04-04 DIAGNOSIS — E119 Type 2 diabetes mellitus without complications: Secondary | ICD-10-CM | POA: Insufficient documentation

## 2013-04-04 DIAGNOSIS — K8010 Calculus of gallbladder with chronic cholecystitis without obstruction: Principal | ICD-10-CM | POA: Diagnosis present

## 2013-04-04 DIAGNOSIS — Z8546 Personal history of malignant neoplasm of prostate: Secondary | ICD-10-CM | POA: Insufficient documentation

## 2013-04-04 DIAGNOSIS — C8589 Other specified types of non-Hodgkin lymphoma, extranodal and solid organ sites: Secondary | ICD-10-CM | POA: Insufficient documentation

## 2013-04-04 DIAGNOSIS — C8590 Non-Hodgkin lymphoma, unspecified, unspecified site: Secondary | ICD-10-CM | POA: Diagnosis present

## 2013-04-04 DIAGNOSIS — Z01812 Encounter for preprocedural laboratory examination: Secondary | ICD-10-CM | POA: Insufficient documentation

## 2013-04-04 DIAGNOSIS — Z87891 Personal history of nicotine dependence: Secondary | ICD-10-CM | POA: Insufficient documentation

## 2013-04-04 DIAGNOSIS — Z794 Long term (current) use of insulin: Secondary | ICD-10-CM | POA: Insufficient documentation

## 2013-04-04 DIAGNOSIS — R599 Enlarged lymph nodes, unspecified: Secondary | ICD-10-CM

## 2013-04-04 DIAGNOSIS — Z7982 Long term (current) use of aspirin: Secondary | ICD-10-CM | POA: Insufficient documentation

## 2013-04-04 HISTORY — PX: OTHER SURGICAL HISTORY: SHX169

## 2013-04-04 HISTORY — PX: CHOLECYSTECTOMY: SHX55

## 2013-04-04 HISTORY — PX: PORTACATH PLACEMENT: SHX2246

## 2013-04-04 LAB — GLUCOSE, CAPILLARY
Glucose-Capillary: 93 mg/dL (ref 70–99)
Glucose-Capillary: 128 mg/dL — ABNORMAL HIGH (ref 70–99)
Glucose-Capillary: 110 mg/dL — ABNORMAL HIGH (ref 70–99)
Glucose-Capillary: 139 mg/dL — ABNORMAL HIGH (ref 70–99)

## 2013-04-04 SURGERY — LAPAROSCOPIC CHOLECYSTECTOMY WITH INTRAOPERATIVE CHOLANGIOGRAM
Anesthesia: General

## 2013-04-04 MED ORDER — HYDROMORPHONE HCL PF 1 MG/ML IJ SOLN
1.0000 mg | INTRAMUSCULAR | Status: DC | PRN
  Administered 2013-04-04: 1 mg via INTRAVENOUS
  Filled 2013-04-04: qty 1

## 2013-04-04 MED ORDER — ONDANSETRON HCL 4 MG PO TABS: 4.0000 mg | ORAL_TABLET | Freq: Four times a day (QID) | ORAL | Status: DC | PRN

## 2013-04-04 MED ORDER — GABAPENTIN 300 MG PO CAPS
300.0000 mg | ORAL_CAPSULE | Freq: Two times a day (BID) | ORAL | Status: DC
  Administered 2013-04-04 – 2013-04-05 (×2): 300 mg via ORAL
  Filled 2013-04-04 (×3): qty 1

## 2013-04-04 MED ORDER — HEPARIN SODIUM (PORCINE) 1000 UNIT/ML IJ SOLN
INTRAMUSCULAR | Status: DC | PRN
  Administered 2013-04-04: 2000 [IU] via INTRAVENOUS

## 2013-04-04 MED ORDER — FENTANYL CITRATE 0.05 MG/ML IJ SOLN
INTRAMUSCULAR | Status: AC
  Filled 2013-04-04: qty 2

## 2013-04-04 MED ORDER — GLYCOPYRROLATE 0.2 MG/ML IJ SOLN
INTRAMUSCULAR | Status: DC | PRN
  Administered 2013-04-04: 0.4 mg via INTRAVENOUS

## 2013-04-04 MED ORDER — OXYCODONE HCL 5 MG PO TABS
5.0000 mg | ORAL_TABLET | Freq: Once | ORAL | Status: AC | PRN
  Administered 2013-04-04: 5 mg via ORAL

## 2013-04-04 MED ORDER — PHENYLEPHRINE HCL 10 MG/ML IJ SOLN
INTRAMUSCULAR | Status: DC | PRN
  Administered 2013-04-04 (×3): 80 ug via INTRAVENOUS

## 2013-04-04 MED ORDER — HYDROCHLOROTHIAZIDE 25 MG PO TABS: 25.0000 mg | ORAL_TABLET | Freq: Every day | ORAL | Status: DC

## 2013-04-04 MED ORDER — PROPOFOL 10 MG/ML IV BOLUS
INTRAVENOUS | Status: DC | PRN
  Administered 2013-04-04: 170 mg via INTRAVENOUS

## 2013-04-04 MED ORDER — LIDOCAINE HCL (CARDIAC) 20 MG/ML IV SOLN
INTRAVENOUS | Status: DC | PRN
  Administered 2013-04-04: 60 mg via INTRAVENOUS

## 2013-04-04 MED ORDER — POTASSIUM CHLORIDE IN NACL 20-0.45 MEQ/L-% IV SOLN
INTRAVENOUS | Status: DC
  Administered 2013-04-04: 15:00:00 via INTRAVENOUS
  Filled 2013-04-04 (×2): qty 1000

## 2013-04-04 MED ORDER — OXYCODONE HCL 5 MG PO TABS
ORAL_TABLET | ORAL | Status: AC
  Filled 2013-04-04: qty 1

## 2013-04-04 MED ORDER — SODIUM CHLORIDE 0.9 % IV SOLN
INTRAVENOUS | Status: DC | PRN
  Administered 2013-04-04: 10:00:00

## 2013-04-04 MED ORDER — METFORMIN HCL 500 MG PO TABS
1000.0000 mg | ORAL_TABLET | Freq: Two times a day (BID) | ORAL | Status: DC
  Administered 2013-04-05: 1000 mg via ORAL
  Filled 2013-04-04 (×4): qty 2

## 2013-04-04 MED ORDER — CEFAZOLIN SODIUM-DEXTROSE 2-3 GM-% IV SOLR
2.0000 g | INTRAVENOUS | Status: AC
  Administered 2013-04-04: 2 g via INTRAVENOUS

## 2013-04-04 MED ORDER — LIDOCAINE HCL (PF) 1 % IJ SOLN
INTRAMUSCULAR | Status: AC
  Filled 2013-04-04: qty 30

## 2013-04-04 MED ORDER — FENTANYL CITRATE 0.05 MG/ML IJ SOLN
25.0000 ug | INTRAMUSCULAR | Status: DC | PRN
  Administered 2013-04-04: 25 ug via INTRAVENOUS

## 2013-04-04 MED ORDER — HEPARIN SODIUM (PORCINE) 1000 UNIT/ML IJ SOLN
INTRAMUSCULAR | Status: AC
  Filled 2013-04-04: qty 1

## 2013-04-04 MED ORDER — ONDANSETRON HCL 4 MG/2ML IJ SOLN: 4.0000 mg | Freq: Four times a day (QID) | INTRAMUSCULAR | Status: DC | PRN

## 2013-04-04 MED ORDER — BUPIVACAINE HCL (PF) 0.5 % IJ SOLN
INTRAMUSCULAR | Status: DC | PRN
  Administered 2013-04-04: 2 mL

## 2013-04-04 MED ORDER — OXYCODONE-ACETAMINOPHEN 5-325 MG PO TABS
1.0000 | ORAL_TABLET | ORAL | Status: DC | PRN
  Administered 2013-04-04 – 2013-04-05 (×2): 2 via ORAL
  Filled 2013-04-04 (×2): qty 2

## 2013-04-04 MED ORDER — EPHEDRINE SULFATE 50 MG/ML IJ SOLN
INTRAMUSCULAR | Status: DC | PRN
  Administered 2013-04-04: 10 mg via INTRAVENOUS
  Administered 2013-04-04 (×2): 5 mg via INTRAVENOUS
  Administered 2013-04-04: 10 mg via INTRAVENOUS

## 2013-04-04 MED ORDER — SODIUM CHLORIDE 0.9 % IR SOLN
Status: DC | PRN
  Administered 2013-04-04: 1000 mL

## 2013-04-04 MED ORDER — SODIUM CHLORIDE 0.9 % IR SOLN
Status: DC | PRN
  Administered 2013-04-04: 11:00:00

## 2013-04-04 MED ORDER — INSULIN NPH (HUMAN) (ISOPHANE) 100 UNIT/ML ~~LOC~~ SUSP: 34.0000 [IU] | Freq: Every day | SUBCUTANEOUS | Status: DC

## 2013-04-04 MED ORDER — INSULIN NPH (HUMAN) (ISOPHANE) 100 UNIT/ML ~~LOC~~ SUSP
36.0000 [IU] | Freq: Every day | SUBCUTANEOUS | Status: DC
  Administered 2013-04-05: 18 [IU] via SUBCUTANEOUS
  Filled 2013-04-04: qty 10

## 2013-04-04 MED ORDER — HYDROCHLOROTHIAZIDE 25 MG PO TABS
25.0000 mg | ORAL_TABLET | Freq: Every day | ORAL | Status: DC
  Administered 2013-04-04 – 2013-04-05 (×2): 25 mg via ORAL
  Filled 2013-04-04 (×2): qty 1

## 2013-04-04 MED ORDER — SODIUM CHLORIDE 0.9 % IV SOLN
10.0000 mg | INTRAVENOUS | Status: DC | PRN
  Administered 2013-04-04: 10 ug/min via INTRAVENOUS

## 2013-04-04 MED ORDER — FENTANYL CITRATE 0.05 MG/ML IJ SOLN
INTRAMUSCULAR | Status: DC | PRN
  Administered 2013-04-04: 100 ug via INTRAVENOUS
  Administered 2013-04-04 (×2): 50 ug via INTRAVENOUS

## 2013-04-04 MED ORDER — BUPIVACAINE-EPINEPHRINE (PF) 0.25% -1:200000 IJ SOLN
INTRAMUSCULAR | Status: AC
  Filled 2013-04-04: qty 30

## 2013-04-04 MED ORDER — LISINOPRIL 40 MG PO TABS
40.0000 mg | ORAL_TABLET | Freq: Every day | ORAL | Status: DC
  Administered 2013-04-04 – 2013-04-05 (×2): 40 mg via ORAL
  Filled 2013-04-04 (×2): qty 1

## 2013-04-04 MED ORDER — LACTATED RINGERS IV SOLN
INTRAVENOUS | Status: DC
  Administered 2013-04-04 (×2): via INTRAVENOUS

## 2013-04-04 MED ORDER — ROCURONIUM BROMIDE 100 MG/10ML IV SOLN
INTRAVENOUS | Status: DC | PRN
  Administered 2013-04-04: 20 mg via INTRAVENOUS
  Administered 2013-04-04: 50 mg via INTRAVENOUS

## 2013-04-04 MED ORDER — ACETAMINOPHEN 325 MG PO TABS: 650.0000 mg | ORAL_TABLET | ORAL | Status: DC | PRN

## 2013-04-04 MED ORDER — ONDANSETRON HCL 4 MG/2ML IJ SOLN
INTRAMUSCULAR | Status: DC | PRN
  Administered 2013-04-04: 4 mg via INTRAVENOUS

## 2013-04-04 MED ORDER — SERTRALINE HCL 100 MG PO TABS
100.0000 mg | ORAL_TABLET | Freq: Every day | ORAL | Status: DC
  Administered 2013-04-05: 100 mg via ORAL
  Filled 2013-04-04: qty 1

## 2013-04-04 MED ORDER — OXYCODONE HCL 5 MG/5ML PO SOLN: 5.0000 mg | Freq: Once | ORAL | Status: AC | PRN

## 2013-04-04 MED ORDER — BUPIVACAINE-EPINEPHRINE 0.25% -1:200000 IJ SOLN
INTRAMUSCULAR | Status: DC | PRN
  Administered 2013-04-04: 20 mL

## 2013-04-04 MED ORDER — INSULIN ASPART 100 UNIT/ML ~~LOC~~ SOLN: 0.0000 [IU] | Freq: Three times a day (TID) | SUBCUTANEOUS | Status: DC

## 2013-04-04 MED ORDER — AMLODIPINE BESYLATE 5 MG PO TABS
5.0000 mg | ORAL_TABLET | Freq: Every day | ORAL | Status: DC
  Administered 2013-04-05: 5 mg via ORAL
  Filled 2013-04-04: qty 1

## 2013-04-04 MED ORDER — NEOSTIGMINE METHYLSULFATE 1 MG/ML IJ SOLN
INTRAMUSCULAR | Status: DC | PRN
  Administered 2013-04-04: 3 mg via INTRAVENOUS

## 2013-04-04 MED ORDER — 0.9 % SODIUM CHLORIDE (POUR BTL) OPTIME
TOPICAL | Status: DC | PRN
  Administered 2013-04-04: 1000 mL

## 2013-04-04 MED ORDER — ATENOLOL 100 MG PO TABS
100.0000 mg | ORAL_TABLET | Freq: Every day | ORAL | Status: DC
  Administered 2013-04-04 – 2013-04-05 (×2): 100 mg via ORAL
  Filled 2013-04-04 (×2): qty 1

## 2013-04-04 SURGICAL SUPPLY — 74 items
APPLIER CLIP ROT 10 11.4 M/L (STAPLE) ×2
BAG DECANTER FOR FLEXI CONT (MISCELLANEOUS) ×2 IMPLANT
BENZOIN TINCTURE PRP APPL 2/3 (GAUZE/BANDAGES/DRESSINGS) ×4 IMPLANT
BLADE SURG 15 STRL LF DISP TIS (BLADE) ×1 IMPLANT
BLADE SURG 15 STRL SS (BLADE) ×1
CANISTER SUCTION 2500CC (MISCELLANEOUS) ×2 IMPLANT
CHLORAPREP W/TINT 10.5 ML (MISCELLANEOUS) ×2 IMPLANT
CHLORAPREP W/TINT 26ML (MISCELLANEOUS) ×2 IMPLANT
CLIP APPLIE ROT 10 11.4 M/L (STAPLE) ×1 IMPLANT
COVER MAYO STAND STRL (DRAPES) ×2 IMPLANT
COVER SURGICAL LIGHT HANDLE (MISCELLANEOUS) ×2 IMPLANT
CRADLE DONUT ADULT HEAD (MISCELLANEOUS) ×2 IMPLANT
DECANTER SPIKE VIAL GLASS SM (MISCELLANEOUS) IMPLANT
DRAPE C-ARM 42X72 X-RAY (DRAPES) ×4 IMPLANT
DRAPE LAPAROTOMY T 102X78X121 (DRAPES) ×2 IMPLANT
DRAPE UTILITY 15X26 W/TAPE STR (DRAPE) ×4 IMPLANT
DRSG OPSITE 4X5.5 SM (GAUZE/BANDAGES/DRESSINGS) ×4 IMPLANT
DRSG TEGADERM 2-3/8X2-3/4 SM (GAUZE/BANDAGES/DRESSINGS) ×6 IMPLANT
DRSG TEGADERM 4X4.75 (GAUZE/BANDAGES/DRESSINGS) ×4 IMPLANT
ELECT CAUTERY BLADE 6.4 (BLADE) ×2 IMPLANT
ELECT REM PT RETURN 9FT ADLT (ELECTROSURGICAL) ×2
ELECTRODE REM PT RTRN 9FT ADLT (ELECTROSURGICAL) ×1 IMPLANT
GAUZE SPONGE 2X2 8PLY STRL LF (GAUZE/BANDAGES/DRESSINGS) ×2 IMPLANT
GAUZE SPONGE 4X4 16PLY XRAY LF (GAUZE/BANDAGES/DRESSINGS) ×2 IMPLANT
GLOVE BIO SURGEON STRL SZ8 (GLOVE) ×2 IMPLANT
GLOVE BIOGEL PI IND STRL 8 (GLOVE) ×2 IMPLANT
GLOVE BIOGEL PI INDICATOR 8 (GLOVE) ×2
GLOVE ECLIPSE 8.0 STRL XLNG CF (GLOVE) ×2 IMPLANT
GLOVE SURG ORTHO 8.0 STRL STRW (GLOVE) ×2 IMPLANT
GOWN PREVENTION PLUS XLARGE (GOWN DISPOSABLE) ×2 IMPLANT
GOWN STRL NON-REIN LRG LVL3 (GOWN DISPOSABLE) ×8 IMPLANT
GOWN STRL REIN XL XLG (GOWN DISPOSABLE) ×4 IMPLANT
GUIDEWIRE ANG ZIPWIRE 038X150 (WIRE) ×2 IMPLANT
INTRODUCER 13FR (MISCELLANEOUS) IMPLANT
INTRODUCER COOK 11FR (CATHETERS) IMPLANT
KIT BASIN OR (CUSTOM PROCEDURE TRAY) ×2 IMPLANT
KIT PORT POWER 8FR ISP CVUE (Catheter) ×2 IMPLANT
KIT PORT POWER 9.6FR MRI PREA (Catheter) IMPLANT
KIT PORT POWER ISP 8FR (Catheter) IMPLANT
KIT POWER CATH 8FR (Catheter) IMPLANT
KIT ROOM TURNOVER OR (KITS) ×2 IMPLANT
NEEDLE 22X1 1/2 (OR ONLY) (NEEDLE) ×2 IMPLANT
NEEDLE HYPO 25GX1X1/2 BEV (NEEDLE) ×2 IMPLANT
NS IRRIG 1000ML POUR BTL (IV SOLUTION) ×2 IMPLANT
PACK SURGICAL SETUP 50X90 (CUSTOM PROCEDURE TRAY) ×2 IMPLANT
PAD ARMBOARD 7.5X6 YLW CONV (MISCELLANEOUS) ×4 IMPLANT
PENCIL BUTTON HOLSTER BLD 10FT (ELECTRODE) ×2 IMPLANT
POUCH SPECIMEN RETRIEVAL 10MM (ENDOMECHANICALS) ×2 IMPLANT
SCISSORS LAP 5X35 DISP (ENDOMECHANICALS) ×2 IMPLANT
SET CHOLANGIOGRAPH 5 50 .035 (SET/KITS/TRAYS/PACK) ×2 IMPLANT
SET INTRODUCER 12FR PACEMAKER (SHEATH) IMPLANT
SET IRRIG TUBING LAPAROSCOPIC (IRRIGATION / IRRIGATOR) ×2 IMPLANT
SET SHEATH INTRODUCER 10FR (MISCELLANEOUS) IMPLANT
SHEATH COOK PEEL AWAY SET 9F (SHEATH) IMPLANT
SLEEVE ENDOPATH XCEL 5M (ENDOMECHANICALS) ×2 IMPLANT
SPECIMEN JAR SMALL (MISCELLANEOUS) ×2 IMPLANT
SPONGE GAUZE 2X2 STER 10/PKG (GAUZE/BANDAGES/DRESSINGS) ×2
SPONGE GAUZE 4X4 12PLY (GAUZE/BANDAGES/DRESSINGS) ×2 IMPLANT
STRIP CLOSURE SKIN 1/4X4 (GAUZE/BANDAGES/DRESSINGS) ×2 IMPLANT
SUT MNCRL AB 4-0 PS2 18 (SUTURE) ×2 IMPLANT
SUT PROLENE 2 0 CT2 30 (SUTURE) ×2 IMPLANT
SUT VIC AB 3-0 SH 27 (SUTURE) ×1
SUT VIC AB 3-0 SH 27XBRD (SUTURE) ×1 IMPLANT
SUT VICRYL 4-0 PS2 18IN ABS (SUTURE) ×2 IMPLANT
SYR 20ML ECCENTRIC (SYRINGE) ×4 IMPLANT
SYR 5ML LUER SLIP (SYRINGE) ×2 IMPLANT
SYR CONTROL 10ML LL (SYRINGE) ×2 IMPLANT
TOWEL OR 17X24 6PK STRL BLUE (TOWEL DISPOSABLE) ×2 IMPLANT
TOWEL OR 17X26 10 PK STRL BLUE (TOWEL DISPOSABLE) ×2 IMPLANT
TRAY LAPAROSCOPIC (CUSTOM PROCEDURE TRAY) ×2 IMPLANT
TROCAR XCEL BLUNT TIP 100MML (ENDOMECHANICALS) ×2 IMPLANT
TROCAR XCEL NON-BLD 11X100MML (ENDOMECHANICALS) ×2 IMPLANT
TROCAR XCEL NON-BLD 5MMX100MML (ENDOMECHANICALS) ×2 IMPLANT
WATER STERILE IRR 1000ML POUR (IV SOLUTION) IMPLANT

## 2013-04-04 NOTE — Progress Notes (Signed)
Placed patient on CPAP via patients home FFM, auto titrate settings (min 5.0-max 18.0) Patient tolerating well at this time.

## 2013-04-04 NOTE — Preoperative (Signed)
Beta Blockers   Reason not to administer Beta Blockers:Not Applicable 

## 2013-04-04 NOTE — Op Note (Signed)
Procedure Note  Pre-operative Diagnosis:  1. Symptomatic cholelithiasis, chronic cholecystitis  2. Recurrent B-cell lymphoma  Post-operative Diagnosis:  same  Surgeon:  Earnstine Regal, MD, FACS  Assistant:  none   Procedure: 1. Laparoscopic cholecystectomy with intra-operative cholangiography  2. Insertion of right subclavian vein infusion port  Anesthesia:  General  Estimated Blood Loss:  minimal  Drains: none         Specimen: Gallbladder to pathology  Indications:  Patient is a 79 yo WM with symptomatic gallstones.  Plan lap chole.  Patient also diagnosed with recurrence of lymphoma and planning chemotherapy to begin January 2015.  Infusion port requested by oncologist.  Procedure Details:  The patient was seen in the pre-op holding area. The risks, benefits, complications, treatment options, and expected outcomes have been discussed with the patient. The patient agreed with the proposed plan and signed the informed consent form.  The patient was taken to Operating Room, identified as [name] and the procedure verified as Laparoscopic Cholecystectomy with Intraoperative Cholangiogram. A "time out" was completed and the above information confirmed.  Following induction of general anesthesia, the patient was placed in the supine position. The abdomen was prepped and draped in the usual aseptic fashion.  An incision was made in the skin below the umbilicus. The midline fascia was incised and the peritoneal cavity entered and the Hasson canula was introduced under direct vision.  The Hasson canula was secured with a 0-Vicryl pursestring suture. Pneumoperitoneum was established with carbon dioxide. Additional trocars were introduced under direct vision along the right costal margin in the midline, mid-clavicular line, and anterior axillary line.   The gallbladder was identified and the fundus grasped and retracted cephalad. Adhesions were taken down bluntly and the electrocautery  was utilized as needed, taking care not to injure any adjacent structures. The infundibulum was grasped and retracted laterally, exposing the peritoneum overlying the triangle of Calot. The peritoneum was incised and structures exposed with blunt dissection. The cystic duct was clearly identified, bluntly dissected circumferentially, and clipped at the neck of the gallbladder.  An incision was made in the cystic duct and the cholangiogram catheter introduced. The catheter was secured using an ligaclip.  Real-time cholangiography was performed using C-arm fluoroscopy.  There was rapid filling of a normal caliber common bile duct.  There was reflux of contrast into the left and right hepatic ductal systems.  There was free flow distally into the duodenum without filling defect or obstruction.  Catheter was removed from the peritoneal cavity.  The cystic duct was then triply ligated with surgical clips and divided. The cystic artery was identified, dissected circumferentially, ligated with ligaclips, and divided.  The gallbladder was dissected away from the liver bed using the electrocautery for hemostasis. The gallbladder was completely removed from the liver and placed into an endocatch bag. The right upper quadrant was irrigated and the gallbladder bed was inspected. Hemostasis was achieved with the electrocautery. Warm saline irrigation was utilized until clear.  Pneumoperitoneum was released after viewing removal of the trocars with good hemostasis noted. The umbilical wound was irrigated and the fascia was then closed with the pursestring suture.  Local anesthetic was infiltrated at all port sites. The skin incisions were closed with 4-0 Monocril subcuticular sutures and steri-strips and dressings were applied.  A second Time-Out was performed and procedure confirmed as placement of infusion port in right subclavian vein.  Patient was completely re-positioned, prepped, and draped.  Skin was anesthetized  with local anesthetic.  Patient was placed in Trendelenberg position.  Using an 18 gauge Seldinger needle, the right subclavian vein was accessed.  Guide wire was inserted and fluoro used to assess position.  Guide wire turned upwards into the IJ vein.  It was removed.  A new guide wire was utilized and advanced under fluoro into the superior vena cava.  Skin incision was made on the right chest wall.  A subcutaneous pocket was created to accomodate the port.  A Clear View port was selected, assembled, and flushed with heparinized saline.  The port was placed in the subcutaneous pocket and secured to the chest wall with interrupted prolene sutures.  The catheter was tunneled subcutaneously to the site of the guide wire.  Catheter was measured and divided at 27 cm length from the port.  Using a peel away sleeve, the catheter was introduced into the right subclavian vein and advanced into position, removing the peel away sleeve in the process.  Fluoro was used to confirm position.  Port aspirated blood easily and was flushed with heparin 1000 U per cc, using 2 cc.  Subcutaneous tissues were closed with interrupted 3-0 Vicryl sutures.  Skin was closed with a running 4-0 Vicryl suture.  Benzoin and steri-strips were applied.  Sterile dressings were applied.  Instrument, sponge, and needle counts were correct at the conclusion of the case.  The patient was awakened from anesthesia and brought to the recovery room in stable condition.  The patient tolerated the procedure well.   Earnstine Regal, MD, [facility] Office: [phone]

## 2013-04-04 NOTE — Anesthesia Preprocedure Evaluation (Signed)
Anesthesia Evaluation  Patient identified by MRN, date of birth, ID band Patient awake    Reviewed: Allergy & Precautions, H&P , NPO status , Patient's Chart, lab work & pertinent test results  Airway Mallampati: II  Neck ROM: full    Dental   Pulmonary shortness of breath, sleep apnea , former smoker,          Cardiovascular hypertension, + CAD     Neuro/Psych    GI/Hepatic GERD-  ,  Endo/Other  diabetes, Type 2  Renal/GU      Musculoskeletal  (+) Arthritis -,   Abdominal   Peds  Hematology H/o lymphoma   Anesthesia Other Findings   Reproductive/Obstetrics                           Anesthesia Physical Anesthesia Plan  ASA: III  Anesthesia Plan: General   Post-op Pain Management:    Induction: Intravenous  Airway Management Planned: Oral ETT  Additional Equipment:   Intra-op Plan:   Post-operative Plan: Extubation in OR  Informed Consent: I have reviewed the patients History and Physical, chart, labs and discussed the procedure including the risks, benefits and alternatives for the proposed anesthesia with the patient or authorized representative who has indicated his/her understanding and acceptance.     Plan Discussed with: CRNA, Anesthesiologist and Surgeon  Anesthesia Plan Comments:         Anesthesia Quick Evaluation

## 2013-04-04 NOTE — Progress Notes (Signed)
Pt admitted to 6n08. VSS. Oriented to unit, room. Callbell placed within reach.

## 2013-04-04 NOTE — Transfer of Care (Signed)
Immediate Anesthesia Transfer of Care Note  Patient: [name]  Procedure(s) Performed: Procedure(s): LAPAROSCOPIC CHOLECYSTECTOMY WITH INTRAOPERATIVE CHOLANGIOGRAM (N/A) INSERTION PORT-A-CATH (N/A)  Patient Location: PACU  Anesthesia Type:General  Level of Consciousness: awake, alert  and oriented  Airway & Oxygen Therapy: Patient Spontanous Breathing and Patient connected to face mask oxygen  Post-op Assessment: Report given to PACU RN, Post -op Vital signs reviewed and stable and Patient moving all extremities X 4  Post vital signs: Reviewed and stable  Complications: No apparent anesthesia complications

## 2013-04-04 NOTE — Anesthesia Procedure Notes (Signed)
Procedure Name: Intubation Date/Time: 04/04/2013 9:29 AM Performed by: Kyung Rudd Pre-anesthesia Checklist: Patient identified, Emergency Drugs available, Suction available, Patient being monitored and Timeout performed Patient Re-evaluated:Patient Re-evaluated prior to inductionOxygen Delivery Method: Circle system utilized Preoxygenation: Pre-oxygenation with 100% oxygen Intubation Type: IV induction Ventilation: Mask ventilation without difficulty Laryngoscope Size: Mac and 4 Grade View: Grade II Tube type: Oral Tube size: 7.5 mm Number of attempts: 1 Airway Equipment and Method: Stylet Placement Confirmation: ETT inserted through vocal cords under direct vision,  positive ETCO2 and breath sounds checked- equal and bilateral Secured at: 22 cm Tube secured with: Tape Dental Injury: Teeth and Oropharynx as per pre-operative assessment

## 2013-04-04 NOTE — H&P (View-Only) (Signed)
General Surgery [facility]  Chief Complaint  Patient presents with  . New Evaluation    eval RUQ pain & retroperitoneal tumor - patient is self-referred; oncologist is Dr. Gunnar Bulla Magrinat; primary care is Dr. [name]    HISTORY: Patient is a 79 year old male known to my practice. Patient has a complex past medical history. Patient has a history of prostate cancer. He has a history of lymphoma involving the liver. He has been treated with chemotherapy and was discharged from the medical oncology practice 2 years ago.  Approximately one month ago the patient developed right upper quadrant abdominal pain associated with nausea and vomiting. This radiated to the back. Episode lasted for approximately 4 days and then resolved. Patient has noted intolerance of fatty foods causing right upper quadrant discomfort and nausea. He denies any history of jaundice. He denies fevers or chills.  CT scan of abdomen and pelvis was obtained by his primary care physician. This does show cholelithiasis. It demonstrates a calcified mass in the medial segment of the left lobe of the liver consistent with his previous history of lymphoma. Also noted is retroperitoneal lymphadenopathy. Patient subsequently underwent a PET scan at [facility]. This shows activity in the retroperitoneal mass as well as in the right adrenal gland and in the lungs felt to be related to recurrent lymphoma.  Patient presents today for evaluation and further recommendations. He is scheduled to see his medical oncologist on 03/25/2013.  Past Medical History  Diagnosis Date  . Diabetes mellitus   . Hypertension   . Cancer of liver   . Kidney stone   . Prostate cancer   . Skin cancer   . Arthritis   . Anemia   . GERD (gastroesophageal reflux disease)     Current Outpatient Prescriptions  Medication Sig Dispense Refill  . amLODipine (NORVASC) 5 MG tablet Take 5 mg by mouth daily.      Marland Kitchen  aspirin 81 MG tablet Take 81 mg by mouth daily.      Marland Kitchen atenolol (TENORMIN) 100 MG tablet Take 100 mg by mouth daily.      Marland Kitchen atorvastatin (LIPITOR) 10 MG tablet Take 10 mg by mouth daily.      . cetirizine (ZYRTEC) 10 MG tablet Take 10 mg by mouth daily.      . Cyanocobalamin (VITAMIN B 12 PO) Take by mouth.      . ezetimibe (ZETIA) 10 MG tablet Take 10 mg by mouth daily.      . FENOFIBRATE PO Take 160 mg by mouth 1 day or 1 dose.      . gabapentin (NEURONTIN) 300 MG capsule Take 300 mg by mouth 2 (two) times daily.      . hydrochlorothiazide (HYDRODIURIL) 25 MG tablet Take 25 mg by mouth daily.      . insulin NPH (HUMULIN N,NOVOLIN N) 100 UNIT/ML injection Inject 34-36 Units into the skin. 36 units the morning and 34 after supper      . lisinopril (PRINIVIL,ZESTRIL) 40 MG tablet Take 40 mg by mouth daily.      . metFORMIN (GLUCOPHAGE) 1000 MG tablet Take 1,000 mg by mouth 2 (two) times daily with a meal.      . Multiple Vitamins-Minerals (MULTIVITAMIN WITH MINERALS) tablet Take 1 tablet by mouth daily.      Marland Kitchen oxyCODONE-acetaminophen (PERCOCET/ROXICET) 5-325 MG per tablet Take 2 tablets by mouth every 4 (four) hours as needed for severe pain.  40 tablet  0  . sertraline (ZOLOFT) 100 MG tablet Take 100 mg by mouth daily.       No current facility-administered medications for this visit.    Allergies  Allergen Reactions  . Niacin Other (See Comments)    headaches    Family History  Problem Relation Age of Onset  . Heart disease Father     History   Social History  . Marital Status: Married    Spouse Name: N/A    Number of Children: N/A  . Years of Education: N/A   Social History Main Topics  . Smoking status: Never Smoker   . Smokeless tobacco: Never Used  . Alcohol Use: No  . Drug Use: No  . Sexual Activity: None   Other Topics Concern  . None   Social History Narrative  . None    REVIEW OF SYSTEMS - PERTINENT POSITIVES ONLY: Intermittent right upper quadrant  abdominal pain. Pain radiating to the right flank. Nausea and fatty food intolerance. Recent weight loss.  EXAM: Filed Vitals:   03/13/13 1015  BP: 116/72  Pulse: 64  Temp: 98 F (36.7 C)  Resp: 14    HEENT: normocephalic; pupils equal and reactive; sclerae clear; dentition good; mucous membranes moist NECK:  No palpable nodules in the thyroid bed; symmetric on extension; no palpable anterior or posterior cervical lymphadenopathy; no supraclavicular masses; no tenderness CHEST: clear to auscultation bilaterally without rales, rhonchi, or wheezes CARDIAC: regular rate and rhythm without significant murmur; peripheral pulses are full ABDOMEN: soft without distension; bowel sounds present; no mass; no hepatosplenomegaly; no hernia EXT:  non-tender with mild left lower extremity edema; no deformity NEURO: no gross focal deficits; no sign of tremor   LABORATORY RESULTS: See [facility] (CHL-Epic) for most recent results  RADIOLOGY RESULTS: See [facility] (CHL-Epic) for most recent results  IMPRESSION: #1 symptomatic cholelithiasis, probable chronic cholecystitis #2 retroperitoneal lymphadenopathy concerning for lymphoma #3 personal history of prostate cancer #4 personal history of lymphoma involving the liver  PLAN: I discussed with the patient, his wife, and his daughter the above findings. I provided them with copies of his PET scan report. There are 2 clinical issues that we need to address. The first is the possibility of recurrent lymphoma involving the retroperitoneum and right adrenal gland. The second issue is his symptomatic cholelithiasis.  We will arrange for consultation with interventional radiology for percutaneous biopsy of the retroperitoneal lymphadenopathy. Hopefully the pathology results will be available prior to his office visit with his oncologist on December 1.  Once the situation with the possible recurrent lymphoma is clarified, we will consider  operative intervention for cholecystectomy for treatment of symptomatic cholelithiasis and chronic cholecystitis.  Earnstine Regal, MD, [facility]  Primary Care Physician: Horatio Pel, MD

## 2013-04-04 NOTE — Interval H&P Note (Signed)
History and Physical Interval Note:  04/04/2013 8:52 AM  [name]  has presented today for surgery, with the diagnosis of chronic cholelithiasis and recurrent B-cell lymphoma.  The various methods of treatment have been discussed with the patient and family. After consideration of risks, benefits and other options for treatment, the patient has consented to    Procedure(s): LAPAROSCOPIC CHOLECYSTECTOMY WITH INTRAOPERATIVE CHOLANGIOGRAM (N/A) INSERTION PORT-A-CATH (N/A) as a surgical intervention .    The patient's history has been reviewed, patient examined, no change in status, stable for surgery.  I have reviewed the patient's chart and labs.  Questions were answered to the patient's satisfaction.    Earnstine Regal, MD, [facility] Office: [place]

## 2013-04-05 ENCOUNTER — Other Ambulatory Visit: Payer: Self-pay | Admitting: Oncology

## 2013-04-05 ENCOUNTER — Other Ambulatory Visit (INDEPENDENT_AMBULATORY_CARE_PROVIDER_SITE_OTHER): Payer: Self-pay

## 2013-04-05 ENCOUNTER — Encounter (HOSPITAL_COMMUNITY): Payer: Self-pay | Admitting: Surgery

## 2013-04-05 ENCOUNTER — Telehealth (INDEPENDENT_AMBULATORY_CARE_PROVIDER_SITE_OTHER): Payer: Self-pay

## 2013-04-05 DIAGNOSIS — C8299 Follicular lymphoma, unspecified, extranodal and solid organ sites: Secondary | ICD-10-CM

## 2013-04-05 DIAGNOSIS — R339 Retention of urine, unspecified: Secondary | ICD-10-CM

## 2013-04-05 LAB — GLUCOSE, CAPILLARY: Glucose-Capillary: 91 mg/dL (ref 70–99)

## 2013-04-05 MED ORDER — OXYCODONE-ACETAMINOPHEN 5-325 MG PO TABS: 1.0000 | ORAL_TABLET | ORAL | Status: DC | PRN

## 2013-04-05 NOTE — Anesthesia Postprocedure Evaluation (Signed)
  Anesthesia Post-op Note  Patient: [name]  Procedure(s) Performed: Procedure(s): LAPAROSCOPIC CHOLECYSTECTOMY WITH INTRAOPERATIVE CHOLANGIOGRAM (N/A) INSERTION PORT-A-CATH (N/A)  Patient Location: Nursing Unit  Anesthesia Type:General  Level of Consciousness: awake, alert  and oriented  Airway and Oxygen Therapy: Patient Spontanous Breathing  Post-op Pain: none  Post-op Assessment: Post-op Vital signs reviewed, Patient's Cardiovascular Status Stable, Respiratory Function Stable, Patent Airway, No signs of Nausea or vomiting, Adequate PO intake and Pain level controlled  Post-op Vital Signs: Reviewed and stable  Complications: No apparent anesthesia complications

## 2013-04-05 NOTE — Progress Notes (Signed)
Pt and pt's wife present for discharge instructions. All medications were reviewed, care of the surgical sites reviewed, and how to care for foley catheter. Pt's wife reported that pt has a urology appointment already scheduled for Monday. All questions were answered and pt is ready for discharge.

## 2013-04-05 NOTE — Telephone Encounter (Signed)
Pt's wife called back and was given appointment information, and told that [name], her daughter was also aware.

## 2013-04-05 NOTE — Discharge Summary (Signed)
Physician Discharge Summary [facility]  Patient ID: [name] MRN: [id number] DOB/AGE: 1931-11-09 79 y.o.  Admit date: 04/04/2013 Discharge date: 04/05/2013  Admission Diagnoses:  Symptomatic cholelithiasis, recurrent B-cell lymphoma  Discharge Diagnoses:  Principal Problem:   Cholelithiasis with cholecystitis Active Problems:   Lymphoma   Cholecystitis with cholelithiasis   Discharged Condition: good  Hospital Course: patient admitted for observation after lap chole and infusion port placement.  Stable overnight.  Complains of urinary retention this AM - recurrent problem for patient.  Will insert catheter and leave on drainage if volume greater than 400 cc.  Otherwise, doing well.  Tolerated breakfast.  Anticipate discharge home today.  Consults: None  Significant Diagnostic Studies: none  Treatments: lap chole with IOC, infusion port placement  Discharge Exam: Blood pressure 108/47, pulse 58, temperature 98.6 F (37 C), temperature source Oral, resp. rate 20, height 5\' 8"  (1.727 m), weight 194 lb 1.6 oz (88.043 kg), SpO2 94.00%. HEENT - clear Neck - soft Chest - clear bilaterally; dressings upper right chest wall clean and dry Cor - RRR Abd - soft without distension; dressings dry and intact  Disposition: Home with family  Discharge Orders   Future Appointments Provider Department Dept Phone   04/08/2013 5:00 PM [name], MD [facility] [phone]   Future Orders Complete By Expires   Diet - low sodium heart healthy  As directed    Discharge instructions  As directed    Comments:     [facility]  LAPAROSCOPIC SURGERY - POST-OP INSTRUCTIONS  Always review your discharge instruction sheet given to you by the facility where your surgery was performed.  A prescription for pain medication may be given to you upon discharge.  Take your pain medication as prescribed.  If narcotic  pain medicine is not needed, then you may take acetaminophen (Tylenol) or ibuprofen (Advil) as needed.  Take your usually prescribed medications unless otherwise directed.  If you need a refill on your pain medication, please contact your pharmacy.  They will contact our office to request authorization. Prescriptions will not be filled after 5 P.M. or on weekends.  You should follow a light diet the first few days after arrival home, such as soup and crackers or toast.  Be sure to include plenty of fluids daily.  Most patients will experience some swelling and bruising in the area of the incisions.  Ice packs will help.  Swelling and bruising can take several days to resolve.   It is common to experience some constipation if taking pain medication after surgery.  Increasing fluid intake and taking a stool softener (such as Colace) will usually help or prevent this problem from occurring.  A mild laxative (Milk of Magnesia or Miralax) should be taken according to package instructions if there are no bowel movements after 48 hours.  Unless discharge instructions indicate otherwise, you may remove your bandages 24-48 hours after surgery, and you may shower at that time.  You may have steri-strips (small skin tapes) in place directly over the incision.  These strips should be left on the skin for 7-10 days.  If your surgeon used skin glue on the incision, you may shower in 24 hours.  The glue will flake off over the next 2-3 weeks.  Any sutures or staples will be removed at the office during your follow-up visit.  ACTIVITIES:  You may resume regular (light) daily activities beginning the next day-such as  daily self-care, walking, climbing stairs-gradually increasing activities as tolerated.  You may have sexual intercourse when it is comfortable.  Refrain from any heavy lifting or straining until approved by your doctor.  You may drive when you are no longer taking prescription pain medication, you can  comfortably wear a seatbelt, and you can safely maneuver your car and apply brakes.  You should see your doctor in the office for a follow-up appointment approximately 2-3 weeks after your surgery.  Make sure that you call for this appointment within a day or two after you arrive home to insure a convenient appointment time.  WHEN TO CALL YOUR DOCTOR: Fever over 101.0 Inability to urinate Continued bleeding from incision Increased pain, redness, or drainage from the incision Increasing abdominal pain  The clinic staff is available to answer your questions during regular business hours.  Please don't hesitate to call and ask to speak to one of the nurses for clinical concerns.  If you have a medical emergency, go to the nearest emergency room or call 911.  A surgeon from [facility] is always on call for the hospital.  Earnstine Regal, MD, [facility] Office: [place] Free:  [phone] FAX [phone]  Web site: [email or web address]   Increase activity slowly  As directed    Insert,temp indwelling blad cath,simple  As directed    Comments:     Catheterize with Foley catheter now.  If volume greater than 400cc, leave Foley in place and instruct patient in care.  Will discharge home with catheter and arrange follow up with Urology early next week.  Earnstine Regal, MD, [facility] Office: [phone]   Remove dressing in 24 hours  As directed        Medication List         amLODipine 5 MG tablet  Commonly known as:  NORVASC  Take 5 mg by mouth daily.     aspirin 81 MG tablet  Take 81 mg by mouth daily.     atenolol 100 MG tablet  Commonly known as:  TENORMIN  Take 100 mg by mouth daily.     atorvastatin 20 MG tablet  Commonly known as:  LIPITOR  Take 20 mg by mouth daily.     cetirizine 10 MG tablet  Commonly known as:  ZYRTEC  Take 10 mg by mouth daily as needed for allergies.      ezetimibe 10 MG tablet  Commonly known as:  ZETIA  Take 10 mg by mouth daily.     fenofibrate 160 MG tablet  Take 160 mg by mouth daily.     gabapentin 300 MG capsule  Commonly known as:  NEURONTIN  Take 300 mg by mouth 2 (two) times daily.     hydrochlorothiazide 25 MG tablet  Commonly known as:  HYDRODIURIL  Take 25 mg by mouth daily.     insulin NPH 100 UNIT/ML injection  Commonly known as:  HUMULIN N,NOVOLIN N  Inject 34-36 Units into the skin. 36 units the morning and 34 after supper     lisinopril 40 MG tablet  Commonly known as:  PRINIVIL,ZESTRIL  Take 40 mg by mouth daily.     metFORMIN 1000 MG tablet  Commonly known as:  GLUCOPHAGE  Take 1,000 mg by mouth 2 (two) times daily with a meal.     multivitamin with minerals tablet  Take 1 tablet by mouth daily.     oxyCODONE-acetaminophen 5-325 MG  per tablet  Commonly known as:  PERCOCET/ROXICET  Take 1-2 tablets by mouth every 4 (four) hours as needed for severe pain.     oxyCODONE-acetaminophen 5-325 MG per tablet  Commonly known as:  PERCOCET/ROXICET  Take 1-2 tablets by mouth every 4 (four) hours as needed for severe pain.     sertraline 100 MG tablet  Commonly known as:  ZOLOFT  Take 100 mg by mouth daily.     VITAMIN B 12 PO  Take 2,500 mg by mouth daily.           Follow-up Information   Follow up with Earnstine Regal, MD. Schedule an appointment as soon as possible for a visit in 3 weeks.   Specialty:  General Surgery   Contact information:   [address] [place] [address] [phone]       Earnstine Regal, MD, [facility] Office: [phone]   Signed: Earnstine Regal 04/05/2013, 8:24 AM

## 2013-04-05 NOTE — Telephone Encounter (Signed)
Per Dr Gala Lewandowsky request appt made with Dr Janice Norrie at [facility] on 04-08-13 for urinary retention. Appt made for po f/u with our office for 04-22-13. [name] pts daughter aware of appts. She will call with any concerns.

## 2013-04-05 NOTE — Telephone Encounter (Signed)
Forward to Dr Harlow Asa.

## 2013-04-08 ENCOUNTER — Encounter (INDEPENDENT_AMBULATORY_CARE_PROVIDER_SITE_OTHER): Payer: Self-pay | Admitting: Surgery

## 2013-04-08 ENCOUNTER — Telehealth: Payer: Self-pay | Admitting: Oncology

## 2013-04-08 ENCOUNTER — Ambulatory Visit: Payer: Medicare Other | Admitting: Oncology

## 2013-04-08 NOTE — Telephone Encounter (Signed)
appt camceled for 12/15 and rs for Jan 5th at 430 per GM POF Jan Cal Mailed shh

## 2013-04-12 LAB — CHROMOSOME ANALYSIS, BONE MARROW

## 2013-04-22 ENCOUNTER — Encounter (INDEPENDENT_AMBULATORY_CARE_PROVIDER_SITE_OTHER): Payer: Self-pay | Admitting: Surgery

## 2013-04-22 ENCOUNTER — Encounter (HOSPITAL_COMMUNITY): Payer: Self-pay

## 2013-04-22 ENCOUNTER — Ambulatory Visit (INDEPENDENT_AMBULATORY_CARE_PROVIDER_SITE_OTHER): Payer: Medicare Other | Admitting: Surgery

## 2013-04-22 VITALS — BP 132/78 | HR 60 | Temp 98.0°F | Resp 18 | Ht 69.0 in | Wt 190.0 lb

## 2013-04-22 DIAGNOSIS — C8590 Non-Hodgkin lymphoma, unspecified, unspecified site: Secondary | ICD-10-CM

## 2013-04-22 DIAGNOSIS — K8010 Calculus of gallbladder with chronic cholecystitis without obstruction: Secondary | ICD-10-CM

## 2013-04-22 DIAGNOSIS — C8589 Other specified types of non-Hodgkin lymphoma, extranodal and solid organ sites: Secondary | ICD-10-CM

## 2013-04-22 NOTE — Progress Notes (Signed)
General Surgery [facility]  Chief Complaint  Patient presents with  . Routine Post Op    lap cholecystectomy and port placement on 04-04-2013    HISTORY: Patient is a 79 year old male who underwent laparoscopic cholecystectomy for chronic cholecystitis and cholelithiasis on 04/04/2013. Concurrently he underwent placement of an infusion port to facilitate chemotherapy for which is to begin in January 2015.  Postoperative course has been uneventful. He is tolerating a regular diet. He is having 2-3 soft bowel movements daily. He notes only minor pain at the umbilicus.  EXAM: Abdomen is soft without distention. Surgical wounds have healed nicely. No sign of herniation. No sign of infection. Right upper quadrant is soft and nontender without palpable mass.  Port-A-Cath site is clear. Steri-Strips remain in place. No sign of seroma. No sign of infection.  IMPRESSION: #1 status post laparoscopic cholecystectomy for chronic cholecystitis and cholelithiasis #2 status post infusion port placement for anticipated chemotherapy for B-cell lymphoma, recurrent  PLAN: Patient will begin applying topical creams to his incisions. He is released to full activity without restriction.  Patient will return for surgical care as needed.  Earnstine Regal, MD, [facility]   Visit Diagnoses: 1. Cholelithiasis with cholecystitis   2. Lymphoma

## 2013-04-22 NOTE — Patient Instructions (Signed)
  COCOA BUTTER & VITAMIN E CREAM  ([employer] or other brand)  Apply cocoa butter/vitamin E cream to your incision 2 - 3 times daily.  Massage cream into incision for one minute with each application.  Use sunscreen (50 SPF or higher) for first 6 months after surgery if area is exposed to sun.  You may substitute Mederma or other scar reducing creams as desired.   

## 2013-04-24 ENCOUNTER — Telehealth: Payer: Self-pay | Admitting: *Deleted

## 2013-04-24 NOTE — Telephone Encounter (Signed)
This RN spoke with pt's daughter,Lisa, due to her call stating request per Dr Janice Norrie for Dr Jana Hakim to [facility] a bone scan while pt in hospital due to elevated PSA.  Per MD. Pt had a PET scan recently that did not show any concerns with pt's bones.  Scan was done at [facility] and was loaded into chart per disc [name] took to radiology.  Per discussion with [name], report will be faxed to Dr Janice Norrie.

## 2013-04-29 ENCOUNTER — Other Ambulatory Visit: Payer: BLUE CROSS/BLUE SHIELD

## 2013-04-29 ENCOUNTER — Encounter (HOSPITAL_COMMUNITY): Payer: Self-pay | Admitting: *Deleted

## 2013-04-29 ENCOUNTER — Ambulatory Visit: Payer: Medicare Other | Admitting: Oncology

## 2013-04-29 ENCOUNTER — Encounter (INDEPENDENT_AMBULATORY_CARE_PROVIDER_SITE_OTHER): Payer: Medicare Other | Admitting: Surgery

## 2013-04-29 ENCOUNTER — Inpatient Hospital Stay (HOSPITAL_COMMUNITY)
Admission: AD | Admit: 2013-04-29 | Discharge: 2013-05-03 | DRG: 847 | Disposition: A | Discharge status: Home / Self Care | Payer: Medicare Other | Source: Ambulatory Visit | Attending: Oncology | Admitting: Oncology

## 2013-04-29 DIAGNOSIS — R599 Enlarged lymph nodes, unspecified: Secondary | ICD-10-CM | POA: Diagnosis present

## 2013-04-29 DIAGNOSIS — Z860100 Personal history of colon polyps, unspecified: Secondary | ICD-10-CM

## 2013-04-29 DIAGNOSIS — Z8546 Personal history of malignant neoplasm of prostate: Secondary | ICD-10-CM

## 2013-04-29 DIAGNOSIS — E119 Type 2 diabetes mellitus without complications: Secondary | ICD-10-CM | POA: Diagnosis not present

## 2013-04-29 DIAGNOSIS — E875 Hyperkalemia: Secondary | ICD-10-CM | POA: Diagnosis present

## 2013-04-29 DIAGNOSIS — C8590 Non-Hodgkin lymphoma, unspecified, unspecified site: Secondary | ICD-10-CM

## 2013-04-29 DIAGNOSIS — K219 Gastro-esophageal reflux disease without esophagitis: Secondary | ICD-10-CM

## 2013-04-29 DIAGNOSIS — Z807 Family history of other malignant neoplasms of lymphoid, hematopoietic and related tissues: Secondary | ICD-10-CM

## 2013-04-29 DIAGNOSIS — Z85828 Personal history of other malignant neoplasm of skin: Secondary | ICD-10-CM | POA: Diagnosis not present

## 2013-04-29 DIAGNOSIS — C8589 Other specified types of non-Hodgkin lymphoma, extranodal and solid organ sites: Secondary | ICD-10-CM | POA: Diagnosis not present

## 2013-04-29 DIAGNOSIS — L02419 Cutaneous abscess of limb, unspecified: Secondary | ICD-10-CM

## 2013-04-29 DIAGNOSIS — Z6828 Body mass index (BMI) 28.0-28.9, adult: Secondary | ICD-10-CM

## 2013-04-29 DIAGNOSIS — Z87442 Personal history of urinary calculi: Secondary | ICD-10-CM

## 2013-04-29 DIAGNOSIS — Z7982 Long term (current) use of aspirin: Secondary | ICD-10-CM | POA: Diagnosis not present

## 2013-04-29 DIAGNOSIS — Z794 Long term (current) use of insulin: Secondary | ICD-10-CM

## 2013-04-29 DIAGNOSIS — Z8249 Family history of ischemic heart disease and other diseases of the circulatory system: Secondary | ICD-10-CM

## 2013-04-29 DIAGNOSIS — Z5111 Encounter for antineoplastic chemotherapy: Secondary | ICD-10-CM | POA: Diagnosis not present

## 2013-04-29 DIAGNOSIS — E785 Hyperlipidemia, unspecified: Secondary | ICD-10-CM | POA: Diagnosis present

## 2013-04-29 DIAGNOSIS — Z801 Family history of malignant neoplasm of trachea, bronchus and lung: Secondary | ICD-10-CM | POA: Diagnosis not present

## 2013-04-29 DIAGNOSIS — R7881 Bacteremia: Secondary | ICD-10-CM

## 2013-04-29 DIAGNOSIS — I10 Essential (primary) hypertension: Secondary | ICD-10-CM | POA: Diagnosis present

## 2013-04-29 DIAGNOSIS — I2510 Atherosclerotic heart disease of native coronary artery without angina pectoris: Secondary | ICD-10-CM

## 2013-04-29 DIAGNOSIS — Z87898 Personal history of other specified conditions: Secondary | ICD-10-CM

## 2013-04-29 DIAGNOSIS — K7460 Unspecified cirrhosis of liver: Secondary | ICD-10-CM | POA: Diagnosis present

## 2013-04-29 DIAGNOSIS — Z9089 Acquired absence of other organs: Secondary | ICD-10-CM | POA: Diagnosis not present

## 2013-04-29 DIAGNOSIS — R590 Localized enlarged lymph nodes: Secondary | ICD-10-CM

## 2013-04-29 DIAGNOSIS — Z800 Family history of malignant neoplasm of digestive organs: Secondary | ICD-10-CM

## 2013-04-29 DIAGNOSIS — G4730 Sleep apnea, unspecified: Secondary | ICD-10-CM | POA: Diagnosis present

## 2013-04-29 DIAGNOSIS — L03119 Cellulitis of unspecified part of limb: Secondary | ICD-10-CM

## 2013-04-29 DIAGNOSIS — K8021 Calculus of gallbladder without cholecystitis with obstruction: Secondary | ICD-10-CM

## 2013-04-29 LAB — CBC WITH DIFFERENTIAL/PLATELET
Basophils Absolute: 0 10*3/uL (ref 0.0–0.1)
Basophils Relative: 0 % (ref 0–1)
EOS ABS: 0.3 10*3/uL (ref 0.0–0.7)
EOS PCT: 4 % (ref 0–5)
HCT: 33.9 % — ABNORMAL LOW (ref 39.0–52.0)
HEMOGLOBIN: 11.5 g/dL — AB (ref 13.0–17.0)
Lymphocytes Relative: 10 % — ABNORMAL LOW (ref 12–46)
Lymphs Abs: 0.9 10*3/uL (ref 0.7–4.0)
MCH: 27.9 pg (ref 26.0–34.0)
MCHC: 33.9 g/dL (ref 30.0–36.0)
MCV: 82.3 fL (ref 78.0–100.0)
MONOS PCT: 8 % (ref 3–12)
Monocytes Absolute: 0.7 10*3/uL (ref 0.1–1.0)
NEUTROS PCT: 79 % — AB (ref 43–77)
Neutro Abs: 6.8 10*3/uL (ref 1.7–7.7)
Platelets: 189 10*3/uL (ref 150–400)
RBC: 4.12 MIL/uL — AB (ref 4.22–5.81)
RDW: 15.1 % (ref 11.5–15.5)
WBC: 8.6 10*3/uL (ref 4.0–10.5)

## 2013-04-29 LAB — COMPREHENSIVE METABOLIC PANEL
ALBUMIN: 4 g/dL (ref 3.5–5.2)
ALK PHOS: 46 U/L (ref 39–117)
ALT: 24 U/L (ref 0–53)
AST: 21 U/L (ref 0–37)
BUN: 20 mg/dL (ref 6–23)
CO2: 26 mEq/L (ref 19–32)
Calcium: 9.9 mg/dL (ref 8.4–10.5)
Chloride: 101 mEq/L (ref 96–112)
Creatinine, Ser: 1.14 mg/dL (ref 0.50–1.35)
GFR calc Af Amer: 69 mL/min — ABNORMAL LOW (ref >90)
GFR calc non Af Amer: 59 mL/min — ABNORMAL LOW (ref >90)
Glucose, Bld: 119 mg/dL — ABNORMAL HIGH (ref 70–99)
POTASSIUM: 5.4 meq/L — AB (ref 3.7–5.3)
SODIUM: 138 meq/L (ref 137–147)
TOTAL PROTEIN: 7 g/dL (ref 6.0–8.3)
Total Bilirubin: 0.3 mg/dL (ref 0.3–1.2)

## 2013-04-29 LAB — URINALYSIS, ROUTINE W REFLEX MICROSCOPIC
Bilirubin Urine: NEGATIVE
Glucose, UA: NEGATIVE mg/dL
Hgb urine dipstick: NEGATIVE
Ketones, ur: NEGATIVE mg/dL
Leukocytes, UA: NEGATIVE
Nitrite: NEGATIVE
PROTEIN: NEGATIVE mg/dL
Specific Gravity, Urine: 1.019 (ref 1.005–1.030)
Urobilinogen, UA: 0.2 mg/dL (ref 0.0–1.0)
pH: 5 (ref 5.0–8.0)

## 2013-04-29 LAB — GLUCOSE, CAPILLARY
Glucose-Capillary: 113 mg/dL — ABNORMAL HIGH (ref 70–99)
Glucose-Capillary: 206 mg/dL — ABNORMAL HIGH (ref 70–99)

## 2013-04-29 LAB — MAGNESIUM: Magnesium: 1.2 mg/dL — ABNORMAL LOW (ref 1.5–2.5)

## 2013-04-29 LAB — PHOSPHORUS: Phosphorus: 3.2 mg/dL (ref 2.3–4.6)

## 2013-04-29 MED ORDER — LISINOPRIL 40 MG PO TABS
40.0000 mg | ORAL_TABLET | Freq: Every day | ORAL | Status: DC
  Administered 2013-04-30 – 2013-05-03 (×4): 40 mg via ORAL
  Filled 2013-04-29 (×4): qty 1

## 2013-04-29 MED ORDER — ATENOLOL 100 MG PO TABS
100.0000 mg | ORAL_TABLET | Freq: Every day | ORAL | Status: DC
  Administered 2013-04-30 – 2013-05-03 (×4): 100 mg via ORAL
  Filled 2013-04-29 (×4): qty 1

## 2013-04-29 MED ORDER — ZOLPIDEM TARTRATE 5 MG PO TABS: 5.0000 mg | ORAL_TABLET | Freq: Every evening | ORAL | Status: DC | PRN

## 2013-04-29 MED ORDER — SERTRALINE HCL 100 MG PO TABS
100.0000 mg | ORAL_TABLET | Freq: Every day | ORAL | Status: DC
  Administered 2013-04-30 – 2013-05-03 (×4): 100 mg via ORAL
  Filled 2013-04-29 (×4): qty 1

## 2013-04-29 MED ORDER — DOCUSATE SODIUM 100 MG PO CAPS
100.0000 mg | ORAL_CAPSULE | Freq: Two times a day (BID) | ORAL | Status: DC | PRN
  Filled 2013-04-29: qty 1

## 2013-04-29 MED ORDER — LORATADINE 10 MG PO TABS
10.0000 mg | ORAL_TABLET | Freq: Every day | ORAL | Status: DC
  Administered 2013-04-30 – 2013-05-03 (×4): 10 mg via ORAL
  Filled 2013-04-29 (×4): qty 1

## 2013-04-29 MED ORDER — MAGNESIUM SULFATE IN D5W 10-5 MG/ML-% IV SOLN
1.0000 g | Freq: Once | INTRAVENOUS | Status: AC
  Administered 2013-04-29: 1 g via INTRAVENOUS
  Filled 2013-04-29: qty 100

## 2013-04-29 MED ORDER — INSULIN ASPART 100 UNIT/ML ~~LOC~~ SOLN
0.0000 [IU] | Freq: Three times a day (TID) | SUBCUTANEOUS | Status: DC
  Administered 2013-04-30 (×2): 2 [IU] via SUBCUTANEOUS
  Administered 2013-04-30: 5 [IU] via SUBCUTANEOUS
  Administered 2013-05-01: 3 [IU] via SUBCUTANEOUS
  Administered 2013-05-01: 5 [IU] via SUBCUTANEOUS
  Administered 2013-05-01: 11 [IU] via SUBCUTANEOUS
  Administered 2013-05-02 (×3): 3 [IU] via SUBCUTANEOUS
  Administered 2013-05-03: 2 [IU] via SUBCUTANEOUS

## 2013-04-29 MED ORDER — INSULIN GLARGINE 100 UNIT/ML ~~LOC~~ SOLN
34.0000 [IU] | Freq: Two times a day (BID) | SUBCUTANEOUS | Status: DC
  Filled 2013-04-29: qty 0.34

## 2013-04-29 MED ORDER — ENOXAPARIN SODIUM 40 MG/0.4ML ~~LOC~~ SOLN
40.0000 mg | SUBCUTANEOUS | Status: DC
  Administered 2013-04-29 – 2013-05-02 (×4): 40 mg via SUBCUTANEOUS
  Filled 2013-04-29 (×5): qty 0.4

## 2013-04-29 MED ORDER — ASPIRIN 81 MG PO TABS: 81.0000 mg | ORAL_TABLET | Freq: Every day | ORAL | Status: DC

## 2013-04-29 MED ORDER — INSULIN NPH (HUMAN) (ISOPHANE) 100 UNIT/ML ~~LOC~~ SUSP
36.0000 [IU] | Freq: Every day | SUBCUTANEOUS | Status: DC
  Administered 2013-04-30 – 2013-05-03 (×4): 36 [IU] via SUBCUTANEOUS
  Filled 2013-04-29 (×2): qty 10

## 2013-04-29 MED ORDER — GABAPENTIN 300 MG PO CAPS
300.0000 mg | ORAL_CAPSULE | Freq: Two times a day (BID) | ORAL | Status: DC
  Administered 2013-04-29 – 2013-05-03 (×8): 300 mg via ORAL
  Filled 2013-04-29 (×9): qty 1

## 2013-04-29 MED ORDER — OXYCODONE-ACETAMINOPHEN 5-325 MG PO TABS
1.0000 | ORAL_TABLET | ORAL | Status: DC | PRN
  Administered 2013-04-30: 2 via ORAL
  Filled 2013-04-29: qty 2

## 2013-04-29 MED ORDER — ASPIRIN EC 81 MG PO TBEC
81.0000 mg | DELAYED_RELEASE_TABLET | Freq: Every day | ORAL | Status: DC
  Administered 2013-04-30 – 2013-05-03 (×4): 81 mg via ORAL
  Filled 2013-04-29 (×4): qty 1

## 2013-04-29 MED ORDER — EZETIMIBE 10 MG PO TABS
10.0000 mg | ORAL_TABLET | Freq: Every day | ORAL | Status: DC
Start: 2013-04-30 — End: 2013-05-03
  Administered 2013-04-30 – 2013-05-03 (×4): 10 mg via ORAL
  Filled 2013-04-29 (×4): qty 1

## 2013-04-29 MED ORDER — DOCUSATE SODIUM 100 MG PO CAPS
100.0000 mg | ORAL_CAPSULE | Freq: Two times a day (BID) | ORAL | Status: DC
  Filled 2013-04-29: qty 1

## 2013-04-29 MED ORDER — ACETAMINOPHEN 500 MG PO TABS: 500.0000 mg | ORAL_TABLET | Freq: Four times a day (QID) | ORAL | Status: DC | PRN

## 2013-04-29 MED ORDER — ADULT MULTIVITAMIN W/MINERALS CH
1.0000 | ORAL_TABLET | Freq: Every day | ORAL | Status: DC
  Administered 2013-04-30 – 2013-05-03 (×4): 1 via ORAL
  Filled 2013-04-29 (×4): qty 1

## 2013-04-29 MED ORDER — INSULIN NPH (HUMAN) (ISOPHANE) 100 UNIT/ML ~~LOC~~ SUSP
34.0000 [IU] | Freq: Every day | SUBCUTANEOUS | Status: DC
  Administered 2013-04-29 – 2013-05-02 (×4): 34 [IU] via SUBCUTANEOUS
  Filled 2013-04-29 (×2): qty 10

## 2013-04-29 MED ORDER — METFORMIN HCL 500 MG PO TABS
1000.0000 mg | ORAL_TABLET | Freq: Two times a day (BID) | ORAL | Status: DC
  Administered 2013-04-29 – 2013-05-03 (×8): 1000 mg via ORAL
  Filled 2013-04-29 (×10): qty 2

## 2013-04-29 MED ORDER — HYDROCHLOROTHIAZIDE 25 MG PO TABS
25.0000 mg | ORAL_TABLET | Freq: Every day | ORAL | Status: DC
Start: 2013-04-30 — End: 2013-05-03
  Administered 2013-04-30 – 2013-05-03 (×4): 25 mg via ORAL
  Filled 2013-04-29 (×4): qty 1

## 2013-04-29 MED ORDER — PANTOPRAZOLE SODIUM 40 MG PO TBEC
40.0000 mg | DELAYED_RELEASE_TABLET | Freq: Every day | ORAL | Status: DC
  Administered 2013-04-29 – 2013-05-03 (×5): 40 mg via ORAL
  Filled 2013-04-29 (×5): qty 1

## 2013-04-29 MED ORDER — ATORVASTATIN CALCIUM 20 MG PO TABS
20.0000 mg | ORAL_TABLET | Freq: Every day | ORAL | Status: DC
  Administered 2013-04-29 – 2013-05-02 (×4): 20 mg via ORAL
  Filled 2013-04-29 (×5): qty 1

## 2013-04-29 MED ORDER — POLYETHYLENE GLYCOL 3350 17 G PO PACK
17.0000 g | PACK | Freq: Every day | ORAL | Status: DC | PRN
  Filled 2013-04-29: qty 1

## 2013-04-29 MED ORDER — AMLODIPINE BESYLATE 5 MG PO TABS
5.0000 mg | ORAL_TABLET | Freq: Every day | ORAL | Status: DC
  Administered 2013-04-30 – 2013-05-03 (×4): 5 mg via ORAL
  Filled 2013-04-29 (×4): qty 1

## 2013-04-29 MED ORDER — SODIUM BICARBONATE 8.4 % IV SOLN
INTRAVENOUS | Status: DC
  Administered 2013-04-29 – 2013-05-03 (×9): via INTRAVENOUS
  Filled 2013-04-29 (×21): qty 1000

## 2013-04-29 MED ORDER — FENOFIBRATE 160 MG PO TABS
160.0000 mg | ORAL_TABLET | Freq: Every day | ORAL | Status: DC
  Administered 2013-04-30 – 2013-05-03 (×4): 160 mg via ORAL
  Filled 2013-04-29 (×4): qty 1

## 2013-04-29 NOTE — H&P (Signed)
ID: [name] OB: Jan 23, 1932 Michael#: [id number] CSN#[id number]  PCP: Horatio Pel, MD  GYN:  SU: [name]  OTHER MD: [name], [name], Donato Heinz  CHIEF COMPLAINT:  "My lymphoma is back"   HISTORY OF PRESENT ILLNESS:  From my of original intake note 06/28/2004:   "The patient developed right upper quadrant pain last year and he had an ultrasound April 5th which showed some gallstones without evidence of cholecystitis or ductal dilatation. However, there were multiple lesions in the liver which could not be assessed further. Accordingly on July 31, 2003, [name], showed multiple liver masses, more than 25, most over 1 cm, the largest being in the inferior right lobe, measuring 4.2 cm. There was also a small mass in the spleen. CT of the pelvis was unremarkable.  The patient had a biopsy of the liver August 01, 2003. The report says only that it was a lesion in the right lobe of the liver. Presumably this was the largest lesion present. The final pathology [id number] and [phone]) showed only cirrhosis.  The patient has been followed by [name], and a repeat CT scan of the abdomen and pelvis was obtained May 18, 2004. Many of the liver lesions seen previously had actually decreased in size. However, the lesion in the posterior aspect of the lateral segment of the left liver had grown to 7.3 cm. Previously it had measured 2.6 cm. Although it says that no focal abnormalities are seen in the spleen, there is clearly a lesion in the spleen which is likely the one seen previously. CT of the pelvis was essentially negative.  With this information, a second ultrasound-guided biopsy was performed 06/11/04. This was a lesion deep in the left lobe of the liver and therefore, I would think not the same one previously biopsied which was in the right liver. The pathology this time [id number]) shows a poorly differentiated neuroendocrine  carcinoma which was positive for chromogranin A, negative for synaptophysin, thyroid transcription factor, a variety of cytokeratins, [name], alpha-fetoprotein and COX-2."   [name] was subsequently evaluated at [facility] by Dr. Elon Alas and repeat liver biopsy and review of the earlier biopsy here showed a primary hepatic lymphoma. The patient was treated with R-CHOP as detailed below and achieved a complete response. He received maintenance rituximab until 2008   INTERVAL HISTORY:  [name] for admission with a view to initiating therapy for his recurrent non-Hodgkin's lymphoma. To review his recent history: He presented with pain "like a kidney stone" and CTs were obtained, which showed no kidney stone but a soft tissue mass in the periaortic/pericaval space in the retroperitoneum. Biopsy of this mass 03/19/2013 confirmed recurrent diffuse large cell non-Hodgkin's lymphoma. Bone marrow biopsy 04/01/2013 showed a hypercellular bone marrow with no evidence of lymphoma. Patient alsounderwent a PET scan at [facility]. This shows activity in the retroperitoneal mass as well as in the right adrenal gland and in the lungs felt to be related to recurrent lymphoma.  The studies also showed cholelithiasis and with symptoms of cholecystitis the patient proceeded to laparoscopic cholecystectomy 04/04/2013 with benign pathology. A port was placed at the same time to facilitate chemotherapy access. [name] is now ready to start his RICE chemotherapy with a view to autologous transplant at [facility] assuming he enters remission  REVIEW OF SYSTEMS:  He did "fine" with the gallbladder surgery and has not required pain medicine fore  several weeks. He has rare mild headaches, no visual changes, no nausea or vomiting though his eating is "not yet normal" and he is having somewhat loose BMs currently, not diarrhea. No cough, phlegm, SOB or pleurisy. Good urine stream. No drenching  sweats, fever, unexplained weight loss of severe fatigue. No adenopathy he is aware of. A detailed review of systems was otherwise stable  PAST MEDICAL HISTORY:  Past Medical History   Diagnosis  Date     Diabetes mellitus      Hypertension      Cancer of liver      Kidney stone      Prostate cancer      Skin cancer      Arthritis      Anemia      GERD (gastroesophageal reflux disease)      Lymphoma    Significant for prostate cancer, the patient undergoing prostatectomy January 2001 under [facility] for a Gleason 7, pathologic T3b (positive seminal vesicle involvement) adenocarcinoma with 0 of 2 lymph nodes involved. Other medical problems include sleep apnea, minor coronary artery disease, hypertension, hypertriglyceridemia, cholelithiasis, colon polyps, cirrhosis by biopsy, diabetes, [name], history of nephrolithiasis, status post right renal surgery and history of left rotator cuff repair under [name].   PAST SURGICAL HISTORY:  Past Surgical History   Procedure  Laterality  Date     Prostatectomy       Kidney stone surgery       Rotator cuff repair       Ankle surgery      FAMILY HISTORY  Family History   Problem  Relation  Age of Onset     Heart disease  Father    The patients father died at the age of 95 from an MI. The patients mother died from old age at 42. The patient is one of nine siblings. One brother died from cancer of the esophagus, one sister with lymphoma and a half-brother with lung cancer.   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 He is now retired. His wife, [name], a homemaker, is present [name] as are the patient's three daughters: [name] is an Scientist, physiological for [employer]; [name], [name]; and  [name] is Environmental consultant. Everybody lives in [place]. The patient has five grandchildren. He is a member of [place]. [place].   ADVANCED DIRECTIVES: in place   HEALTH MAINTENANCE:  History   Substance Use Topics     Smoking status:  Never Smoker     Smokeless tobacco:  Never Used     Alcohol Use:  No   Colonoscopy:  PSA: 8.01 (NOV 2014)  Bone density:  Lipid panel:    Allergies   Allergen  Reactions     Niacin  Other (See Comments)     headaches    Current Outpatient Prescriptions   Medication  Sig  Dispense  Refill     amLODipine (NORVASC) 5 MG tablet  Take 5 mg by mouth daily.       aspirin 81 MG tablet  Take 81 mg by mouth daily.       atenolol (TENORMIN) 100 MG tablet  Take 100 mg by mouth daily.       atorvastatin (LIPITOR) 20  MG tablet  Take 20 mg by mouth daily.       cetirizine (ZYRTEC) 10 MG tablet  Take 10 mg by mouth daily as needed for allergies.       Cyanocobalamin (VITAMIN B 12 PO)  Take 2,500 mg by mouth daily.       ezetimibe (ZETIA) 10 MG tablet  Take 10 mg by mouth daily.       fenofibrate 160 MG tablet  Take 160 mg by mouth daily.       gabapentin (NEURONTIN) 300 MG capsule  Take 300 mg by mouth 2 (two) times daily.       hydrochlorothiazide (HYDRODIURIL) 25 MG tablet  Take 25 mg by mouth daily.       insulin NPH (HUMULIN N,NOVOLIN N) 100 UNIT/ML injection  Inject 34-36 Units into the skin. 36 units the morning and 34 after supper       lisinopril (PRINIVIL,ZESTRIL) 40 MG tablet  Take 40 mg by mouth daily.       metFORMIN (GLUCOPHAGE) 1000 MG tablet  Take 1,000 mg by mouth 2 (two) times daily with a meal.       Multiple Vitamins-Minerals (MULTIVITAMIN WITH MINERALS) tablet  Take 1 tablet by mouth daily.       oxyCODONE-acetaminophen (PERCOCET/ROXICET) 5-325 MG per tablet  Take 1-2 tablets by mouth every 4 (four) hours as needed for severe pain.       sertraline (ZOLOFT) 100 MG tablet  Take  100 mg by mouth daily.       Scheduled Meds:  [START ON 04/30/2013] amLODipine  5 mg Oral Daily   [START ON 04/30/2013] aspirin EC  81 mg Oral Daily   [START ON 04/30/2013] atenolol  100 mg Oral Daily   atorvastatin  20 mg Oral q1800   docusate sodium  100 mg Oral BID   enoxaparin (LOVENOX) injection  40 mg Subcutaneous Q24H   [START ON 04/30/2013] ezetimibe  10 mg Oral Daily   [START ON 04/30/2013] fenofibrate  160 mg Oral Daily   gabapentin  300 mg Oral BID   [START ON 04/30/2013] hydrochlorothiazide  25 mg Oral Daily   insulin aspart  0-15 Units Subcutaneous TID WC   insulin NPH Human  34 Units Subcutaneous Q supper   [START ON 04/30/2013] insulin NPH Human  36 Units Subcutaneous QAC breakfast   [START ON 04/30/2013] lisinopril  40 mg Oral Daily   [START ON 04/30/2013] loratadine  10 mg Oral Daily   metFORMIN  1,000 mg Oral BID WC   [START ON 04/30/2013] multivitamin with minerals  1 tablet Oral Daily   [START ON 04/30/2013] sertraline  100 mg Oral Daily   Continuous Infusions:  dextrose 5 % 1,000 mL with sodium bicarbonate 100 mEq infusion     PRN Meds:.oxyCODONE-acetaminophen, polyethylene glycol, zolpidem   OBJECTIVE: White male in no acute distress   There were no vitals filed for this visit.  ECOG FS:1 - Symptomatic but completely ambulatory   Ocular: Sclerae unicteric, pupils equal, round and reactive. Ear-nose-throat: Oropharynx clear, no thrush or other lesions  Lymphatic: No cervical or supraclavicular adenopathy; no axillary or inguinal adenopathy  Lungs no rales or rhonchi, good excursion bilaterally  Heart regular rate and rhythm, no murmur appreciated  Abd soft, nontender, positive bowel sounds, no masses palpated  MSK no focal spinal tenderness, no joint edema  Neuro: non-focal, well-oriented, positive affect    LAB RESULTS:  Basic Metabolic Panel: No results found for this basename: NA, K,  CL, CO2, GLUCOSE, BUN, CREATININE, CALCIUM, MG, PHOS,  in the last  168 hours GFR The CrCl is unknown because both a height and weight (above a minimum accepted value) are required for this calculation. Liver Function Tests: No results found for this basename: AST, ALT, ALKPHOS, BILITOT, PROT, ALBUMIN,  in the last 168 hours No results found for this basename: LIPASE, AMYLASE,  in the last 168 hours No results found for this basename: AMMONIA,  in the last 168 hours Coagulation profile No results found for this basename: INR, PROTIME,  in the last 168 hours  CBC: No results found for this basename: WBC, NEUTROABS, HGB, HCT, MCV, PLT,  in the last 168 hours Cardiac Enzymes: No results found for this basename: CKTOTAL, CKMB, CKMBINDEX, TROPONINI,  in the last 168 hours BNP: No components found with this basename: POCBNP,  CBG: No results found for this basename: GLUCAP,  in the last 168 hours D-Dimer No results found for this basename: DDIMER,  in the last 72 hours Hgb A1c No results found for this basename: HGBA1C,  in the last 72 hours Lipid Profile No results found for this basename: CHOL, HDL, LDLCALC, TRIG, CHOLHDL, LDLDIRECT,  in the last 72 hours Thyroid function studies No results found for this basename: TSH, T4TOTAL, FREET3, T3FREE, THYROIDAB,  in the last 72 hours Anemia work up No results found for this basename: VITAMINB12, FOLATE, FERRITIN, TIBC, IRON, RETICCTPCT,  in the last 72 hours Microbiology No results found for this or any previous visit (from the past 240 hour(s)).    Results for [name], [name] (MRN [id number]) as of 03/25/2013 18:32   Ref. Range  12/03/2009 08:35  03/29/2010 08:43  08/25/2010 09:31  03/02/2011 08:13  03/20/2013 09:16   LDH  Latest Range: 125-245 U/L  135  119  151  128  157    Results for [name], [name] (MRN [id number]) as of 03/25/2013 18:32   Ref. Range  12/03/2009 08:35  03/29/2010 08:43  08/25/2010 09:31  03/02/2011 08:13  03/20/2013 09:20   Beta-2 Microglobulin  Latest Range: 1.01-1.73 mg/L  3.36 (H)  2.85  (H)  2.50 (H)  2.04 (H)  3.00 (H)     STUDIES:  Transthoracic Echocardiography  Patient: [name], [name] Michael #: [id number] Study Date: 03/29/2013 Gender: M Age: 80 Height: 172.7cm Weight: 89.5kg BSA: 2.9m2 Pt. Status: Room:  PERFORMING [facility]villeMagrinat, GHazle NordmannREFERRING PHoratio PelSONOGRAPHER GDiamond Nickelcc:  ------------------------------------------------------------ LV EF: 50% - 55%  ------------------------------------------------------------ Indications: V58.11 Chemotherapy Evaluation.  ------------------------------------------------------------ History: PMH: Lymphoma. Coronary artery disease. Risk factors: Diabetes mellitus. Dyslipidemia.  ------------------------------------------------------------ Study Conclusions  - Left ventricle: The cavity size was normal. Wall thickness was normal. Systolic function was normal. The estimated ejection fraction was in the range of 50% to 55%. Wall motion was normal; there were no regional wall motion abnormalities. Doppler parameters are consistent with abnormal left ventricular relaxation (grade 1 diastolic dysfunction). - Aortic valve: Moderately calcified annulus. Trileaflet; moderately thickened, moderately calcified leaflets. - Mitral valve: Calcified annulus. Mildly thickened leaflets . Mild regurgitation. - Left atrium: The atrium was moderately dilated. - Atrial septum: No defect or patent foramen ovale was identified. Transthoracic echocardiography. M-mode, complete 2D, spectral Doppler, and color Doppler. Height: Height: 172.7cm. Height: 68in. Weight: Weight: 89.5kg. Weight: 197lb. Body mass index: BMI: 30kg/m^2. Body surface area: BSA: 2.112m. Blood pressure: 129/62. Patient status: Outpatient. Location: Echo laboratory.  ------------------------------------------------------------  ------------------------------------------------------------ Left ventricle:  The cavity size was normal. Wall  thickness was normal. Systolic function was normal. The estimated ejection fraction was in the range of 50% to 55%. Wall motion was normal; there were no regional wall motion abnormalities. Doppler parameters are consistent with abnormal left ventricular relaxation (grade 1 diastolic dysfunction).   Dg Chest 2 View  04/03/2013   CLINICAL DATA:  Preop for gallbladder surgery  EXAM: CHEST  2 VIEW  COMPARISON:  03/11/2013  FINDINGS: Cardiomediastinal silhouette is stable. No acute infiltrate or pleural effusion. No pulmonary edema. Mild degenerative changes thoracic spine.  IMPRESSION: No active cardiopulmonary disease.   Electronically Signed   By: Lahoma Crocker M.D.   On: 04/03/2013 16:52   Dg Cholangiogram Operative  04/04/2013   CLINICAL DATA:  Cholelithiasis  EXAM: INTRAOPERATIVE CHOLANGIOGRAM  TECHNIQUE: Cholangiographic images from the C-arm fluoroscopic device were submitted for interpretation post-operatively. Please see the procedural report for the amount of contrast and the fluoroscopy time utilized.  COMPARISON:  None.  FINDINGS: No persistent filling defects in the common duct. Intrahepatic ducts are incompletely visualized, appearing decompressed centrally. Contrast passes into the duodenum.  : Negative for retained common duct stone.   Electronically Signed   By: Arne Cleveland M.D.   On: 04/04/2013 13:41   Ct Biopsy  04/01/2013   INDICATION: Lymphoma  EXAM: CT BIOPSY  MEDICATIONS: Fentanyl 100 mcg IV; Versed 2 mg IV  ANESTHESIA/SEDATION: Sedation Time  10 minutes  CONTRAST:  None  PROCEDURE: Informed consent was obtained from the patient following an explanation of the procedure, risks, benefits and alternatives. The patient understands, agrees and consents for the procedure. All questions were addressed. A time out was performed prior to the initiation of the procedure. The patient was positioned prone and noncontrast localization CT was performed of  the pelvis to demonstrate the iliac marrow spaces. The operative site was prepped and draped in the usual sterile fashion.  Under sterile conditions and local anesthesia, an 11 gauge coaxial bone biopsy needle was advanced into the left iliac marrow space. Needle position was confirmed with CT imaging. Initially, bone marrow aspiration was performed. Next, a bone marrow biopsy was obtained with the 11 gauge outer bone marrow device. Samples were prepared with the cytotechnologist and deemed adequate. The needle was removed intact. Hemostasis was obtained with compression and a dressing was placed. The patient tolerated the procedure well without immediate post procedural complication.  COMPLICATIONS: None immediate.  IMPRESSION: Successful CT guided left iliac bone marrow aspiration and core biopsies.   Electronically Signed   By: Sandi Mariscal M.D.   On: 04/01/2013 12:04   Dg Chest Port 1 View  04/04/2013   CLINICAL DATA:  Status post Port-A-Cath insertion.  EXAM: PORTABLE CHEST - 1 VIEW  COMPARISON:  04/03/2013  FINDINGS: Low lung volumes. A right-sided porta catheter is appreciated via a right subclavian approach. The tip projects in the region of the right atrium. There is no evidence of pneumothorax. No focal regions of consolidation or focal infiltrates. The osseous structures unremarkable.  IMPRESSION: Patient is status post right-sided porta catheter insertion. No evidence of acute cardiopulmonary disease nor pneumothorax.   Electronically Signed   By: Margaree Mackintosh M.D.   On: 04/04/2013 12:31   Dg Fluoro Guide Cv Line-no Report  04/04/2013   CLINICAL DATA: port placement   FLOURO GUIDE CV LINE  Fluoroscopy was utilized by the requesting physician.  No radiographic  interpretation.     Patient: [name], [name] Collected: 03/19/2013 Client: [place]  Accession: [id number] Received:  03/19/2013 Jacqulynn Cadet, MD  DOB: [birth year] Age: 64 Gender: M Reported: 03/22/2013 [address]  Patient Ph: [phone] MRN #: [id number] [place], [place] [address]  Visit #: [id number] Chart #: Phone:  Fax:  CC: [name], MD  SURGICAL PATHOLOGY  FINAL DIAGNOSIS  Diagnosis  Retroperitoneal mass, biopsy  - DIFFUSE LARGE B-CELL LYMPHOMA, CLINICALLY RECURRENT.  - SEE ONCOLOGY TABLE.  Microscopic Comment  LYMPHOMA  Histologic type: Non-Hodgkin lymphoma- diffuse large B-cell lymphoma  Grade (if applicable): High grade.  Flow cytometry: Not performed.  Immunohistochemical stains: CD45, CD20, CD3, CD10, pancytokeratin.  Touch preps/imprints: N/A.  Comments: The biopsy consists predominately of necrotic tissue. There are a few foci of viable large highly  atypical cells with irregular nuclear contours and prominent nucleoli. Immunohistochemistry reveals the cells  are positive for CD45, CD20. CD3 highlights a few background T-completely T-cells. Pancytokeratin is  negative. CD10 is non-contributory due to high levels of background staining. Overall, the morphologic and  immunophenotypic findings are consistent with a diffuse large B-cell lymphoma. Given the patient's history  these findings indicate recurrent disease.  Vicente Males MD  Pathologist, Electronic Signature  (Case signed 03/22/2013)    ASSESSMENT: 80 y.o. [place] man with a diagnosis of primary hepatic non-Hodgkin's lymphoma established through liver biopsy February 2006, treated with Rituxan x9 and then CHOP x6. All chemotherapy completed in 2007. Maintenance Rituxan discontinued October 2008.   (1) biopsy proven retroperitoneal recurrence documented November 2014; bone marrow biopsy 04/01/2013 negative  (2) admitted 04/29/2013 for first of two planned cycles of R-ICE chemotherapy with a view to eventual autologous transplant at [place]:  [name] is aware of the possible toxicities, side effects and complications of this chemotherapy and agrees to proceed as planned. We will hydrate and alkalinize his  urine overnight and start treatment tomorrow. He will complete thre cycle 01/08 but I will likely keep him until 01/09 to make sure nausea and any other problems are safely controlled.  Incidentally his urologist Dr Janice Norrie requested a bone scan to make sure there were no lesions from the patient's prostate cancer. However he had a PET scan in [facility] which showed no bone spots. We will scan a copy into his records ([facility] does not participate in [place] yet).  [name], MD 04/29/2013 3:14 PM

## 2013-04-30 ENCOUNTER — Other Ambulatory Visit: Payer: Self-pay | Admitting: Oncology

## 2013-04-30 ENCOUNTER — Telehealth: Payer: Self-pay | Admitting: Oncology

## 2013-04-30 DIAGNOSIS — C8590 Non-Hodgkin lymphoma, unspecified, unspecified site: Secondary | ICD-10-CM

## 2013-04-30 LAB — URINALYSIS, ROUTINE W REFLEX MICROSCOPIC
Bilirubin Urine: NEGATIVE
Glucose, UA: NEGATIVE mg/dL
Hgb urine dipstick: NEGATIVE
KETONES UR: NEGATIVE mg/dL
LEUKOCYTES UA: NEGATIVE
Nitrite: NEGATIVE
PH: 5.5 (ref 5.0–8.0)
PROTEIN: NEGATIVE mg/dL
Specific Gravity, Urine: 1.008 (ref 1.005–1.030)
UROBILINOGEN UA: 0.2 mg/dL (ref 0.0–1.0)

## 2013-04-30 LAB — GLUCOSE, CAPILLARY
GLUCOSE-CAPILLARY: 129 mg/dL — AB (ref 70–99)
GLUCOSE-CAPILLARY: 236 mg/dL — AB (ref 70–99)
GLUCOSE-CAPILLARY: 300 mg/dL — AB (ref 70–99)
Glucose-Capillary: 107 mg/dL — ABNORMAL HIGH (ref 70–99)
Glucose-Capillary: 130 mg/dL — ABNORMAL HIGH (ref 70–99)

## 2013-04-30 LAB — COMPREHENSIVE METABOLIC PANEL
ALBUMIN: 3.5 g/dL (ref 3.5–5.2)
ALK PHOS: 41 U/L (ref 39–117)
ALT: 18 U/L (ref 0–53)
AST: 17 U/L (ref 0–37)
BUN: 15 mg/dL (ref 6–23)
CALCIUM: 9.1 mg/dL (ref 8.4–10.5)
CO2: 29 mEq/L (ref 19–32)
Chloride: 96 mEq/L (ref 96–112)
Creatinine, Ser: 0.79 mg/dL (ref 0.50–1.35)
GFR calc Af Amer: >90 mL/min (ref >90)
GFR calc non Af Amer: 83 mL/min — ABNORMAL LOW (ref >90)
Glucose, Bld: 120 mg/dL — ABNORMAL HIGH (ref 70–99)
Potassium: 3.8 mEq/L (ref 3.7–5.3)
SODIUM: 136 meq/L — AB (ref 137–147)
TOTAL PROTEIN: 6.1 g/dL (ref 6.0–8.3)
Total Bilirubin: 0.2 mg/dL — ABNORMAL LOW (ref 0.3–1.2)

## 2013-04-30 LAB — MAGNESIUM: Magnesium: 1.3 mg/dL — ABNORMAL LOW (ref 1.5–2.5)

## 2013-04-30 LAB — PSA: PSA: 7.77 ng/mL — ABNORMAL HIGH (ref <=4.00)

## 2013-04-30 LAB — HEPATITIS B CORE ANTIBODY, IGM: HEP B C IGM: NONREACTIVE

## 2013-04-30 LAB — HEPATITIS C ANTIBODY: HCV Ab: NEGATIVE

## 2013-04-30 LAB — HEPATITIS B SURFACE ANTIGEN: Hepatitis B Surface Ag: NEGATIVE

## 2013-04-30 MED ORDER — RITUXIMAB CHEMO INJECTION 10 MG/ML
375.0000 mg/m2 | Freq: Once | INTRAVENOUS | Status: AC
  Administered 2013-04-30: 800 mg via INTRAVENOUS
  Filled 2013-04-30: qty 80

## 2013-04-30 MED ORDER — ALLOPURINOL 300 MG PO TABS
300.0000 mg | ORAL_TABLET | Freq: Every day | ORAL | Status: DC
  Administered 2013-04-30 – 2013-05-03 (×4): 300 mg via ORAL
  Filled 2013-04-30 (×4): qty 1

## 2013-04-30 MED ORDER — ACETAMINOPHEN 325 MG PO TABS
650.0000 mg | ORAL_TABLET | Freq: Once | ORAL | Status: AC
Start: 2013-04-30 — End: 2013-04-30
  Administered 2013-04-30: 650 mg via ORAL
  Filled 2013-04-30: qty 2

## 2013-04-30 MED ORDER — ETOPOSIDE CHEMO INJECTION 1 GM/50ML
100.0000 mg/m2 | Freq: Once | INTRAVENOUS | Status: AC
  Administered 2013-04-30: 210 mg via INTRAVENOUS
  Filled 2013-04-30: qty 10.5

## 2013-04-30 MED ORDER — SODIUM CHLORIDE 0.9 % IV SOLN
INTRAVENOUS | Status: DC
  Administered 2013-04-30: 500 mL via INTRAVENOUS

## 2013-04-30 MED ORDER — HEPARIN SOD (PORK) LOCK FLUSH 100 UNIT/ML IV SOLN: 500.0000 [IU] | Freq: Once | INTRAVENOUS | Status: AC | PRN

## 2013-04-30 MED ORDER — SODIUM CHLORIDE 0.9 % IJ SOLN
10.0000 mL | INTRAMUSCULAR | Status: DC | PRN
Start: 2013-04-30 — End: 2013-05-03

## 2013-04-30 MED ORDER — DIPHENHYDRAMINE HCL 25 MG PO CAPS
25.0000 mg | ORAL_CAPSULE | Freq: Once | ORAL | Status: AC
  Administered 2013-04-30: 25 mg via ORAL
  Filled 2013-04-30: qty 1

## 2013-04-30 MED ORDER — SODIUM CHLORIDE 0.9 % IJ SOLN
3.0000 mL | INTRAMUSCULAR | Status: DC | PRN
Start: 2013-04-30 — End: 2013-05-03

## 2013-04-30 MED ORDER — ONDANSETRON HCL 4 MG/2ML IJ SOLN
Freq: Once | INTRAMUSCULAR | Status: AC
  Administered 2013-04-30: 20 mg via INTRAVENOUS
  Filled 2013-04-30: qty 8

## 2013-04-30 MED ORDER — ALTEPLASE 2 MG IJ SOLR
2.0000 mg | Freq: Once | INTRAMUSCULAR | Status: AC | PRN
  Filled 2013-04-30: qty 2

## 2013-04-30 MED ORDER — HOT PACK MISC ONCOLOGY
1.0000 | Freq: Once | Status: AC | PRN
  Filled 2013-04-30: qty 1

## 2013-04-30 MED ORDER — HEPARIN SOD (PORK) LOCK FLUSH 100 UNIT/ML IV SOLN: 250.0000 [IU] | Freq: Once | INTRAVENOUS | Status: AC | PRN

## 2013-04-30 MED ORDER — MAGNESIUM SULFATE 40 MG/ML IJ SOLN
2.0000 g | Freq: Once | INTRAMUSCULAR | Status: AC
  Administered 2013-04-30: 2 g via INTRAVENOUS
  Filled 2013-04-30: qty 50

## 2013-04-30 NOTE — Progress Notes (Signed)
CD's of PET scans given back to patient.

## 2013-04-30 NOTE — Progress Notes (Signed)
Paged Dr. Jana Hakim about K+ 5.4 and Mg+1.2. Orders received.

## 2013-04-30 NOTE — Care Management Note (Signed)
   CARE MANAGEMENT NOTE 04/30/2013  Patient:  [name], [name]   Account Number:  [id number]  Date Initiated:  04/30/2013  Documentation initiated by:  Terika Pillard  Subjective/Objective Assessment:   80 yo male admitted 04/29/2013 for first of two planned cycles of R-ICE chemotherapy.     Action/Plan:   Home when stable   Anticipated DC Date:     Anticipated DC Plan:  [place]  CM consult      Choice offered to / List presented to:  NA   DME arranged  NA      DME agency  NA     [place] arranged  NA      [place] agency  NA   Status of service:  In process, will continue to follow Medicare Important Message given?   (If response is "NO", the following Medicare IM given date fields will be blank) Date Medicare IM given:   Date Additional Medicare IM given:    Discharge Disposition:    Per UR Regulation:  Reviewed for med. necessity/level of care/duration of stay  If discussed at [place] of Stay Meetings, dates discussed:    Comments:  04/30/13 [place] Chart reviewed for utilization of services. PTA pt from home. PCP: Dr.Pharr. No needs identified at this time.

## 2013-04-30 NOTE — Telephone Encounter (Signed)
lvm for pt regarding to all Jan appts.

## 2013-05-01 DIAGNOSIS — Z5111 Encounter for antineoplastic chemotherapy: Secondary | ICD-10-CM | POA: Diagnosis not present

## 2013-05-01 DIAGNOSIS — I10 Essential (primary) hypertension: Secondary | ICD-10-CM | POA: Diagnosis not present

## 2013-05-01 DIAGNOSIS — E875 Hyperkalemia: Secondary | ICD-10-CM

## 2013-05-01 DIAGNOSIS — C8589 Other specified types of non-Hodgkin lymphoma, extranodal and solid organ sites: Secondary | ICD-10-CM | POA: Diagnosis not present

## 2013-05-01 DIAGNOSIS — E119 Type 2 diabetes mellitus without complications: Secondary | ICD-10-CM | POA: Diagnosis not present

## 2013-05-01 LAB — COMPREHENSIVE METABOLIC PANEL
ALBUMIN: 3.7 g/dL (ref 3.5–5.2)
ALK PHOS: 47 U/L (ref 39–117)
ALT: 30 U/L (ref 0–53)
AST: 26 U/L (ref 0–37)
BILIRUBIN TOTAL: 0.4 mg/dL (ref 0.3–1.2)
BUN: 23 mg/dL (ref 6–23)
CO2: 28 meq/L (ref 19–32)
CREATININE: 0.94 mg/dL (ref 0.50–1.35)
Calcium: 9 mg/dL (ref 8.4–10.5)
Chloride: 91 mEq/L — ABNORMAL LOW (ref 96–112)
GFR, EST AFRICAN AMERICAN: 90 mL/min — AB (ref >90)
GFR, EST NON AFRICAN AMERICAN: 77 mL/min — AB (ref >90)
Glucose, Bld: 250 mg/dL — ABNORMAL HIGH (ref 70–99)
POTASSIUM: 4.5 meq/L (ref 3.7–5.3)
Sodium: 133 mEq/L — ABNORMAL LOW (ref 137–147)
Total Protein: 6.8 g/dL (ref 6.0–8.3)

## 2013-05-01 LAB — CBC WITH DIFFERENTIAL/PLATELET
BASOS PCT: 0 % (ref 0–1)
Basophils Absolute: 0 10*3/uL (ref 0.0–0.1)
Eosinophils Absolute: 0 10*3/uL (ref 0.0–0.7)
Eosinophils Relative: 0 % (ref 0–5)
HEMATOCRIT: 32.9 % — AB (ref 39.0–52.0)
Hemoglobin: 11.2 g/dL — ABNORMAL LOW (ref 13.0–17.0)
LYMPHS ABS: 0.2 10*3/uL — AB (ref 0.7–4.0)
Lymphocytes Relative: 2 % — ABNORMAL LOW (ref 12–46)
MCH: 27.3 pg (ref 26.0–34.0)
MCHC: 34 g/dL (ref 30.0–36.0)
MCV: 80 fL (ref 78.0–100.0)
MONO ABS: 0.4 10*3/uL (ref 0.1–1.0)
Monocytes Relative: 4 % (ref 3–12)
Neutro Abs: 10.8 10*3/uL — ABNORMAL HIGH (ref 1.7–7.7)
Neutrophils Relative %: 94 % — ABNORMAL HIGH (ref 43–77)
Platelets: 179 10*3/uL (ref 150–400)
RBC: 4.11 MIL/uL — AB (ref 4.22–5.81)
RDW: 14.5 % (ref 11.5–15.5)
WBC: 11.4 10*3/uL — ABNORMAL HIGH (ref 4.0–10.5)

## 2013-05-01 LAB — GLUCOSE, CAPILLARY
GLUCOSE-CAPILLARY: 230 mg/dL — AB (ref 70–99)
GLUCOSE-CAPILLARY: 231 mg/dL — AB (ref 70–99)
GLUCOSE-CAPILLARY: 302 mg/dL — AB (ref 70–99)
Glucose-Capillary: 238 mg/dL — ABNORMAL HIGH (ref 70–99)
Glucose-Capillary: 187 mg/dL — ABNORMAL HIGH (ref 70–99)
Glucose-Capillary: 326 mg/dL — ABNORMAL HIGH (ref 70–99)

## 2013-05-01 LAB — URINALYSIS, ROUTINE W REFLEX MICROSCOPIC
Bilirubin Urine: NEGATIVE
GLUCOSE, UA: 250 mg/dL — AB
Hgb urine dipstick: NEGATIVE
Ketones, ur: NEGATIVE mg/dL
LEUKOCYTES UA: NEGATIVE
NITRITE: NEGATIVE
PH: 5.5 (ref 5.0–8.0)
Protein, ur: NEGATIVE mg/dL
Specific Gravity, Urine: 1.01 (ref 1.005–1.030)
Urobilinogen, UA: 0.2 mg/dL (ref 0.0–1.0)

## 2013-05-01 LAB — MAGNESIUM: Magnesium: 1.6 mg/dL (ref 1.5–2.5)

## 2013-05-01 MED ORDER — SODIUM CHLORIDE 0.9 % IV SOLN
452.0000 mg | Freq: Once | INTRAVENOUS | Status: AC
  Administered 2013-05-01: 450 mg via INTRAVENOUS
  Filled 2013-05-01: qty 45

## 2013-05-01 MED ORDER — PROCHLORPERAZINE MALEATE 10 MG PO TABS
10.0000 mg | ORAL_TABLET | Freq: Three times a day (TID) | ORAL | Status: DC
  Administered 2013-05-01 – 2013-05-03 (×9): 10 mg via ORAL
  Filled 2013-05-01 (×15): qty 1

## 2013-05-01 MED ORDER — SODIUM CHLORIDE 0.9 % IV SOLN
Freq: Once | INTRAVENOUS | Status: AC
  Administered 2013-05-01: 13:00:00 via INTRAVENOUS
  Filled 2013-05-01: qty 205

## 2013-05-01 MED ORDER — SODIUM CHLORIDE 0.9 % IV SOLN
100.0000 mg/m2 | INTRAVENOUS | Status: AC
  Administered 2013-05-01 – 2013-05-02 (×2): 210 mg via INTRAVENOUS
  Filled 2013-05-01 (×2): qty 10.5

## 2013-05-01 MED ORDER — ONDANSETRON HCL 4 MG/2ML IJ SOLN
4.0000 mg | Freq: Every day | INTRAMUSCULAR | Status: DC
  Administered 2013-05-01 – 2013-05-02 (×2): 4 mg via INTRAVENOUS
  Filled 2013-05-01 (×2): qty 2

## 2013-05-01 MED ORDER — SODIUM CHLORIDE 0.9 % IV SOLN
INTRAVENOUS | Status: AC
  Administered 2013-05-01: 16 mg via INTRAVENOUS
  Administered 2013-05-02: 36 mg via INTRAVENOUS
  Filled 2013-05-01 (×2): qty 8

## 2013-05-01 NOTE — Progress Notes (Signed)
[name]   DOB:May 13, 1931   DZ#:[id number]   TMH#:[id number]  Subjective: uneventful day yesterday, tolerated rituximab and etoposide w/o event; good BM past 24 h; no N/V; rest of ROS stable   Objective: middle aged white man examined in recliner Filed Vitals:   05/01/13 0300  BP: 137/70  Pulse:   Temp:   Resp: 16    Body mass index is 28.84 kg/(m^2).  Intake/Output Summary (Last 24 hours) at 05/01/13 9211 Last data filed at 04/30/13 2110  Gross per 24 hour  Intake   3973 ml  Output   1550 ml  Net   2423 ml     Lungs clear -- no rales or rhonchi  Heart regular rate and rhythm  Abdomen soft, +BS  MSK no peripheral edema  Neuro nonfocal   CBG (last 3)   Recent Labs  04/30/13 1658 04/30/13 2125 05/01/13 0250  GLUCAP 236* 300* 238*     Labs:  Lab Results  Component Value Date   WBC 8.6 04/29/2013   HGB 11.5* 04/29/2013   HCT 33.9* 04/29/2013   MCV 82.3 04/29/2013   PLT 189 04/29/2013   NEUTROABS 6.8 04/29/2013    '@LASTCHEMISTRY'$ @  Urine Studies No results found for this basename: UACOL, UAPR, USPG, UPH, UTP, UGL, UKET, UBIL, UHGB, UNIT, UROB, ULEU, UEPI, UWBC, URBC, UBAC, CAST, CRYS, UCOM, BILUA,  in the last 72 hours  Basic Metabolic Panel:  Recent Labs Lab 04/29/13 1734 04/30/13 0630  NA 138 136*  K 5.4* 3.8  CL 101 96  CO2 26 29  GLUCOSE 119* 120*  BUN 20 15  CREATININE 1.14 0.79  CALCIUM 9.9 9.1  MG 1.2* 1.3*  PHOS 3.2  --    GFR Estimated Creatinine Clearance: 82.5 ml/min (by C-G formula based on Cr of 0.79). Liver Function Tests:  Recent Labs Lab 04/29/13 1734 04/30/13 0630  AST 21 17  ALT 24 18  ALKPHOS 46 41  BILITOT 0.3 0.2*  PROT 7.0 6.1  ALBUMIN 4.0 3.5   No results found for this basename: LIPASE, AMYLASE,  in the last 168 hours No results found for this basename: AMMONIA,  in the last 168 hours Coagulation profile No results found for this basename: INR, PROTIME,  in the last 168 hours  CBC:  Recent Labs Lab  04/29/13 1734  WBC 8.6  NEUTROABS 6.8  HGB 11.5*  HCT 33.9*  MCV 82.3  PLT 189   Cardiac Enzymes: No results found for this basename: CKTOTAL, CKMB, CKMBINDEX, TROPONINI,  in the last 168 hours BNP: No components found with this basename: POCBNP,  CBG:  Recent Labs Lab 04/30/13 0749 04/30/13 1144 04/30/13 1658 04/30/13 2125 05/01/13 0250  GLUCAP 129* 130* 236* 300* 238*   D-Dimer No results found for this basename: DDIMER,  in the last 72 hours Hgb A1c No results found for this basename: HGBA1C,  in the last 72 hours Lipid Profile No results found for this basename: CHOL, HDL, LDLCALC, TRIG, CHOLHDL, LDLDIRECT,  in the last 72 hours Thyroid function studies No results found for this basename: TSH, T4TOTAL, FREET3, T3FREE, THYROIDAB,  in the last 72 hours Anemia work up No results found for this basename: VITAMINB12, FOLATE, FERRITIN, TIBC, IRON, RETICCTPCT,  in the last 72 hours Microbiology No results found for this or any previous visit (from the past 240 hour(s)).    Studies:  No results found.  Assessment: 80 y.o. [place] man with a diagnosis of primary hepatic non-Hodgkin's lymphoma established through liver  biopsy February 2006, treated with Rituxan x9 and then CHOP x6. All chemotherapy completed in 2007. Maintenance Rituxan discontinued October 2008.  (1) biopsy proven retroperitoneal recurrence documented November 2014; bone marrow biopsy 04/01/2013 negative  (2) admitted 04/29/2013 for first of two planned cycles of R-ICE chemotherapy with a view to eventual autologous transplant at [facility] (3) hyperkalemia: improved w alkalinization (4) hypomagnesemia: correct as needed (5) diabetes mellitus: will continue home regimen for now (6) hypertension    Plan: proceeding with day 2 cycle 1 R-ICE; today's labs pending; hope for discharge home 05/03/2013.   [name], MD 05/01/2013  6:23 AM

## 2013-05-02 LAB — CBC WITH DIFFERENTIAL/PLATELET
Basophils Absolute: 0 10*3/uL (ref 0.0–0.1)
Basophils Relative: 0 % (ref 0–1)
EOS ABS: 0 10*3/uL (ref 0.0–0.7)
EOS PCT: 0 % (ref 0–5)
HCT: 30.8 % — ABNORMAL LOW (ref 39.0–52.0)
Hemoglobin: 10.5 g/dL — ABNORMAL LOW (ref 13.0–17.0)
LYMPHS PCT: 2 % — AB (ref 12–46)
Lymphs Abs: 0.2 10*3/uL — ABNORMAL LOW (ref 0.7–4.0)
MCH: 27.6 pg (ref 26.0–34.0)
MCHC: 34.1 g/dL (ref 30.0–36.0)
MCV: 81.1 fL (ref 78.0–100.0)
Monocytes Absolute: 0.4 10*3/uL (ref 0.1–1.0)
Monocytes Relative: 3 % (ref 3–12)
NEUTROS PCT: 95 % — AB (ref 43–77)
Neutro Abs: 10 10*3/uL — ABNORMAL HIGH (ref 1.7–7.7)
PLATELETS: 139 10*3/uL — AB (ref 150–400)
RBC: 3.8 MIL/uL — AB (ref 4.22–5.81)
RDW: 14.6 % (ref 11.5–15.5)
WBC: 10.6 10*3/uL — AB (ref 4.0–10.5)

## 2013-05-02 LAB — COMPREHENSIVE METABOLIC PANEL
ALT: 41 U/L (ref 0–53)
AST: 33 U/L (ref 0–37)
Albumin: 3.2 g/dL — ABNORMAL LOW (ref 3.5–5.2)
Alkaline Phosphatase: 46 U/L (ref 39–117)
BUN: 24 mg/dL — ABNORMAL HIGH (ref 6–23)
CALCIUM: 8.7 mg/dL (ref 8.4–10.5)
CO2: 28 mEq/L (ref 19–32)
Chloride: 94 mEq/L — ABNORMAL LOW (ref 96–112)
Creatinine, Ser: 0.9 mg/dL (ref 0.50–1.35)
GFR calc Af Amer: >90 mL/min (ref >90)
GFR calc non Af Amer: 79 mL/min — ABNORMAL LOW (ref >90)
Glucose, Bld: 271 mg/dL — ABNORMAL HIGH (ref 70–99)
POTASSIUM: 3.9 meq/L (ref 3.7–5.3)
SODIUM: 139 meq/L (ref 137–147)
TOTAL PROTEIN: 6.1 g/dL (ref 6.0–8.3)
Total Bilirubin: <0.2 mg/dL — ABNORMAL LOW (ref 0.3–1.2)

## 2013-05-02 LAB — URINALYSIS, ROUTINE W REFLEX MICROSCOPIC
Bilirubin Urine: NEGATIVE
Glucose, UA: NEGATIVE mg/dL
HGB URINE DIPSTICK: NEGATIVE
Ketones, ur: >80 mg/dL — AB
Leukocytes, UA: NEGATIVE
NITRITE: NEGATIVE
PROTEIN: NEGATIVE mg/dL
SPECIFIC GRAVITY, URINE: 1.008 (ref 1.005–1.030)
UROBILINOGEN UA: 0.2 mg/dL (ref 0.0–1.0)
pH: 7.5 (ref 5.0–8.0)

## 2013-05-02 LAB — GLUCOSE, CAPILLARY
GLUCOSE-CAPILLARY: 192 mg/dL — AB (ref 70–99)
GLUCOSE-CAPILLARY: 274 mg/dL — AB (ref 70–99)
Glucose-Capillary: 175 mg/dL — ABNORMAL HIGH (ref 70–99)
Glucose-Capillary: 199 mg/dL — ABNORMAL HIGH (ref 70–99)

## 2013-05-02 LAB — MAGNESIUM: Magnesium: 1.5 mg/dL (ref 1.5–2.5)

## 2013-05-02 NOTE — Progress Notes (Signed)
Inpatient Diabetes Program Recommendations  AACE/ADA: New Consensus Statement on Inpatient Glycemic Control (2013)  Target Ranges:  Prepandial:   less than 140 mg/dL      Peak postprandial:   less than 180 mg/dL (1-2 hours)      Critically ill patients:  140 - 180 mg/dL   Reason for Visit: Hyperglycemia  Good po intake.   Results for DONN, ANGSTADT (MRN [id number]) as of 05/02/2013 09:51  Ref. Range 04/30/2013 16:58 04/30/2013 21:25 05/01/2013 02:50 05/01/2013 06:37 05/01/2013 06:41 05/01/2013 11:52 05/01/2013 17:31 05/01/2013 21:39 05/02/2013 06:58  Glucose-Capillary Latest Range: 70-99 mg/dL 236 (H) 300 (H) 238 (H) 231 (H) 230 (H) 187 (H) 326 (H) 302 (H) 192 (H)    Inpatient Diabetes Program Recommendations Correction (SSI): Add HS correction Insulin - Meal Coverage: Add meal coverage insulin - Novolog 6 units tidwc if pt eats >50% meal Diet: Change diet to CHO mod med  Note: Will follow while inpatient.  Thank you. Lorenda Peck, RD, LDN, CDE Inpatient Diabetes Coordinator [phone]

## 2013-05-03 DIAGNOSIS — Z5111 Encounter for antineoplastic chemotherapy: Secondary | ICD-10-CM | POA: Diagnosis not present

## 2013-05-03 DIAGNOSIS — E119 Type 2 diabetes mellitus without complications: Secondary | ICD-10-CM | POA: Diagnosis not present

## 2013-05-03 DIAGNOSIS — C8589 Other specified types of non-Hodgkin lymphoma, extranodal and solid organ sites: Secondary | ICD-10-CM | POA: Diagnosis not present

## 2013-05-03 LAB — CBC WITH DIFFERENTIAL/PLATELET
BASOS PCT: 0 % (ref 0–1)
Basophils Absolute: 0 10*3/uL (ref 0.0–0.1)
EOS ABS: 0 10*3/uL (ref 0.0–0.7)
EOS PCT: 0 % (ref 0–5)
HEMATOCRIT: 30.2 % — AB (ref 39.0–52.0)
HEMOGLOBIN: 10.4 g/dL — AB (ref 13.0–17.0)
Lymphocytes Relative: 3 % — ABNORMAL LOW (ref 12–46)
Lymphs Abs: 0.2 10*3/uL — ABNORMAL LOW (ref 0.7–4.0)
MCH: 27.8 pg (ref 26.0–34.0)
MCHC: 34.4 g/dL (ref 30.0–36.0)
MCV: 80.7 fL (ref 78.0–100.0)
MONOS PCT: 2 % — AB (ref 3–12)
Monocytes Absolute: 0.1 10*3/uL (ref 0.1–1.0)
Neutro Abs: 6 10*3/uL (ref 1.7–7.7)
Neutrophils Relative %: 95 % — ABNORMAL HIGH (ref 43–77)
Platelets: 144 10*3/uL — ABNORMAL LOW (ref 150–400)
RBC: 3.74 MIL/uL — ABNORMAL LOW (ref 4.22–5.81)
RDW: 14.7 % (ref 11.5–15.5)
WBC: 6.3 10*3/uL (ref 4.0–10.5)

## 2013-05-03 LAB — COMPREHENSIVE METABOLIC PANEL
ALBUMIN: 3.3 g/dL — AB (ref 3.5–5.2)
ALT: 55 U/L — ABNORMAL HIGH (ref 0–53)
AST: 43 U/L — ABNORMAL HIGH (ref 0–37)
Alkaline Phosphatase: 41 U/L (ref 39–117)
BUN: 26 mg/dL — ABNORMAL HIGH (ref 6–23)
CO2: 31 mEq/L (ref 19–32)
CREATININE: 0.9 mg/dL (ref 0.50–1.35)
Calcium: 8.9 mg/dL (ref 8.4–10.5)
Chloride: 93 mEq/L — ABNORMAL LOW (ref 96–112)
GFR calc Af Amer: >90 mL/min (ref >90)
GFR calc non Af Amer: 79 mL/min — ABNORMAL LOW (ref >90)
Glucose, Bld: 189 mg/dL — ABNORMAL HIGH (ref 70–99)
Potassium: 3.5 mEq/L — ABNORMAL LOW (ref 3.7–5.3)
Sodium: 137 mEq/L (ref 137–147)
TOTAL PROTEIN: 5.9 g/dL — AB (ref 6.0–8.3)
Total Bilirubin: 0.3 mg/dL (ref 0.3–1.2)

## 2013-05-03 LAB — GLUCOSE, CAPILLARY: Glucose-Capillary: 146 mg/dL — ABNORMAL HIGH (ref 70–99)

## 2013-05-03 LAB — MAGNESIUM: MAGNESIUM: 1.5 mg/dL (ref 1.5–2.5)

## 2013-05-03 MED ORDER — INSULIN NPH (HUMAN) (ISOPHANE) 100 UNIT/ML ~~LOC~~ SUSP
36.0000 [IU] | Freq: Every day | SUBCUTANEOUS | Status: DC
Start: 2013-05-03 — End: 2013-05-14

## 2013-05-03 MED ORDER — ALLOPURINOL 300 MG PO TABS: 300.0000 mg | ORAL_TABLET | Freq: Every day | ORAL | Status: DC

## 2013-05-03 MED ORDER — HEPARIN SOD (PORK) LOCK FLUSH 100 UNIT/ML IV SOLN
500.0000 [IU] | Freq: Once | INTRAVENOUS | Status: DC
  Filled 2013-05-03: qty 5

## 2013-05-03 MED ORDER — PROCHLORPERAZINE MALEATE 10 MG PO TABS: 10.0000 mg | ORAL_TABLET | Freq: Four times a day (QID) | ORAL | Status: DC | PRN

## 2013-05-03 NOTE — Discharge Summary (Signed)
Physician Discharge Summary  Patient ID: [name] MRN: [id number] DOB/AGE: 1931/12/18 80 y.o.  Admit date: 04/29/2013 Discharge date: 05/03/2013  Primary Care Physician:  Horatio Pel, MD   Discharge Diagnoses:  Non-hodgkin's lymphoma, diabetes mellitus, hypertension  Present on Admission:  . ATHEROSCLEROSIS, CORONARY, NATIVE ARTERY . DIABETES MELLITUS, TYPE II . GERD . Lymphoma . Lymphadenopathy, retroperitoneal . NHL (non-Hodgkin's lymphoma)  Discharge Medications:    Medication List    STOP taking these medications       oxyCODONE-acetaminophen 5-325 MG per tablet  Commonly known as:  PERCOCET/ROXICET      TAKE these medications       allopurinol 300 MG tablet  Commonly known as:  ZYLOPRIM  Take 1 tablet (300 mg total) by mouth daily.     amLODipine 5 MG tablet  Commonly known as:  NORVASC  Take 5 mg by mouth daily.     aspirin 81 MG tablet  Take 81 mg by mouth daily.     atenolol 100 MG tablet  Commonly known as:  TENORMIN  Take 100 mg by mouth daily.     atorvastatin 20 MG tablet  Commonly known as:  LIPITOR  Take 20 mg by mouth daily.     cetirizine 10 MG tablet  Commonly known as:  ZYRTEC  Take 10 mg by mouth daily as needed for allergies.     ezetimibe 10 MG tablet  Commonly known as:  ZETIA  Take 10 mg by mouth daily.     fenofibrate 160 MG tablet  Take 160 mg by mouth daily.     gabapentin 300 MG capsule  Commonly known as:  NEURONTIN  Take 300 mg by mouth 2 (two) times daily.     hydrochlorothiazide 25 MG tablet  Commonly known as:  HYDRODIURIL  Take 25 mg by mouth daily.     insulin NPH Human 100 UNIT/ML injection  Commonly known as:  HUMULIN N,NOVOLIN N  Inject 34-36 Units into the skin. 36 units the morning and 34 after supper     insulin NPH Human 100 UNIT/ML injection  Commonly known as:  HUMULIN N,NOVOLIN N  Inject 36 Units into the skin daily before breakfast.     lisinopril 40 MG tablet   Commonly known as:  PRINIVIL,ZESTRIL  Take 40 mg by mouth daily.     metFORMIN 1000 MG tablet  Commonly known as:  GLUCOPHAGE  Take 1,000 mg by mouth 2 (two) times daily with a meal.     multivitamin with minerals tablet  Take 1 tablet by mouth daily.     prochlorperazine 10 MG tablet  Commonly known as:  COMPAZINE  Take 1 tablet (10 mg total) by mouth every 6 (six) hours as needed for nausea or vomiting.     sertraline 100 MG tablet  Commonly known as:  ZOLOFT  Take 100 mg by mouth daily.     VITAMIN B 12 PO  Take 2,500 mg by mouth daily.         Disposition and Follow-up:  Discharged to home 05/03/2013; return to clinic 05/04/2013 for neulasta, and 05/08/2013 at 11 AM for lab check and exam. Return to clinic 05/13/2013 at 3:15 PM for admission (cycle 2)  Significant Diagnostic Studies:  Dg Chest 2 View  04/03/2013   CLINICAL DATA:  Preop for gallbladder surgery  EXAM: CHEST  2 VIEW  COMPARISON:  03/11/2013  FINDINGS: Cardiomediastinal silhouette is stable. No acute infiltrate or pleural effusion. No pulmonary edema. Mild  degenerative changes thoracic spine.  IMPRESSION: No active cardiopulmonary disease.   Electronically Signed   By: Lahoma Crocker M.D.   On: 04/03/2013 16:52   Dg Cholangiogram Operative  04/04/2013   CLINICAL DATA:  Cholelithiasis  EXAM: INTRAOPERATIVE CHOLANGIOGRAM  TECHNIQUE: Cholangiographic images from the C-arm fluoroscopic device were submitted for interpretation post-operatively. Please see the procedural report for the amount of contrast and the fluoroscopy time utilized.  COMPARISON:  None.  FINDINGS: No persistent filling defects in the common duct. Intrahepatic ducts are incompletely visualized, appearing decompressed centrally. Contrast passes into the duodenum.  : Negative for retained common duct stone.   Electronically Signed   By: Arne Cleveland M.D.   On: 04/04/2013 13:41   Dg Chest Port 1 View  04/04/2013   CLINICAL DATA:  Status post  Port-A-Cath insertion.  EXAM: PORTABLE CHEST - 1 VIEW  COMPARISON:  04/03/2013  FINDINGS: Low lung volumes. A right-sided porta catheter is appreciated via a right subclavian approach. The tip projects in the region of the right atrium. There is no evidence of pneumothorax. No focal regions of consolidation or focal infiltrates. The osseous structures unremarkable.  IMPRESSION: Patient is status post right-sided porta catheter insertion. No evidence of acute cardiopulmonary disease nor pneumothorax.   Electronically Signed   By: Margaree Mackintosh M.D.   On: 04/04/2013 12:31   Dg Fluoro Guide Cv Line-no Report  04/04/2013   CLINICAL DATA: port placement   FLOURO GUIDE CV LINE  Fluoroscopy was utilized by the requesting physician.  No radiographic  interpretation.     Discharge Laboratory Values: Basic Metabolic Panel:  Recent Labs Lab 04/29/13 1734 04/30/13 0630 05/01/13 0635 05/02/13 0427 05/03/13 0500  NA 138 136* 133* 139 137  K 5.4* 3.8 4.5 3.9 3.5*  CL 101 96 91* 94* 93*  CO2 _0 GLUCOSE 119* 120* 250* 271* 189*  BUN _1 24* 26*  CREATININE 1.14 0.79 0.94 0.90 0.90  CALCIUM 9.9 9.1 9.0 8.7 8.9  MG 1.2* 1.3* 1.6 1.5 1.5  PHOS 3.2  --   --   --   --    GFR Estimated Creatinine Clearance: 73.3 ml/min (by C-G formula based on Cr of 0.9). Liver Function Tests:  Recent Labs Lab 04/29/13 1734 04/30/13 0630 05/01/13 0635 05/02/13 0427 05/03/13 0500  AST _2 33 43*  ALT _3 41 55*  ALKPHOS 46 41 47 46 41  BILITOT 0.3 0.2* 0.4 <0.2* 0.3  PROT 7.0 6.1 6.8 6.1 5.9*  ALBUMIN 4.0 3.5 3.7 3.2* 3.3*   No results found for this basename: LIPASE, AMYLASE,  in the last 168 hours No results found for this basename: AMMONIA,  in the last 168 hours Coagulation profile No results found for this basename: INR, PROTIME,  in the last 168 hours  CBC:  Recent Labs Lab 04/29/13 1734 05/01/13 0635 05/02/13 0427 05/03/13 0500  WBC 8.6 11.4* 10.6* 6.3   NEUTROABS 6.8 10.8* 10.0* 6.0  HGB 11.5* 11.2* 10.5* 10.4*  HCT 33.9* 32.9* 30.8* 30.2*  MCV 82.3 80.0 81.1 80.7  PLT 189 179 139* 144*   Cardiac Enzymes: No results found for this basename: CKTOTAL, CKMB, CKMBINDEX, TROPONINI,  in the last 168 hours BNP: No components found with this basename: POCBNP,  CBG:  Recent Labs Lab 05/02/13 0658 05/02/13 1158 05/02/13 1732 05/02/13 2147 05/03/13 0727  GLUCAP 192* 175* 199* 274* 146*   D-Dimer No results found for this basename: DDIMER,  in the last 72 hours Hgb A1c No results found for this basename: HGBA1C,  in the last 72 hours Lipid Profile No results found for this basename: CHOL, HDL, LDLCALC, TRIG, CHOLHDL, LDLDIRECT,  in the last 72 hours Thyroid function studies No results found for this basename: TSH, T4TOTAL, FREET3, T3FREE, THYROIDAB,  in the last 72 hours Anemia work up No results found for this basename: VITAMINB12, FOLATE, FERRITIN, TIBC, IRON, RETICCTPCT,  in the last 72 hours Microbiology No results found for this or any previous visit (from the past 240 hour(s)).   Brief H and P: For complete details please refer to admission H and P, but in brief, patient was admitted 04/29/2013 for his first cycle of R-ICE chemotherapy. See Hospital Course for details  Physical Exam at Discharge: BP 118/68  Pulse 83  Temp(Src) 97.7 F (36.5 C) (Oral)  Resp 16  Ht _0  (1.753 m)  Wt 195 lb 6.4 oz (88.633 kg)  BMI 28.84 kg/m2  SpO2 98% Gen: middle aged white male examined in recliner Cardiovascular: RRR, no murmur appreciated Respiratory: no rales or wheezes, good excursion Gastrointestinal: +BS, NT Extremities: no peripheral edema    Hospital Course:  Active Problems:   HX, PERSONAL, MALIGNANCY, LYMPHATIC NEC   Lymphadenopathy, retroperitoneal   Lymphoma   DIABETES MELLITUS, TYPE II   ATHEROSCLEROSIS, CORONARY, NATIVE ARTERY   GERD   NHL (non-Hodgkin's lymphoma)  80 y.o. [place] man with a diagnosis  of primary hepatic non-Hodgkin's lymphoma established through liver biopsy February 2006, treated with Rituxan x9 and then CHOP x6. All chemotherapy completed in 2007. Maintenance Rituxan discontinued October 2008.  (1) biopsy proven retroperitoneal recurrence documented November 2014; bone marrow biopsy 04/01/2013 negative  (2) admitted 04/29/2013 for first of two planned cycles of R-ICE chemotherapy with a view to eventual autologous transplant at [facility]  Mr. Bollig was admitted 04/29/2013 and hydrated overnight, starting RICE 04/30/2013, consisting of rituximab (375 mg/M2) 800 mg IV day 1, etoposide (100 mg/M2) 210 mg IV days 1-3, carboplatin (AUC 5) 450 mg IV day 2, and ifosfamide 10,250 mg with an equivalent amount of [place] day 2. The patient tolerated treatment well, with no significant nausea, fatigue, rash, mental status changes, or other symptoms. His CBG's were adequate on his home insulin regimen   Results for GEOGE, LAWRANCE (MRN [id number]) as of 05/03/2013 08:21  Ref. Range 05/02/2013 06:58 05/02/2013 11:58 05/02/2013 17:32 05/02/2013 21:47 05/03/2013 07:27  Glucose-Capillary Latest Range: 70-99 mg/dL 192 (H) 175 (H) 199 (H) 274 (H) 146 (H)   and there was no evidence of tumor lysis syndrome. -- The patient's wife [name] was admitted for knee replacement and will be going to Rehab, so patient will be by himself at home, but family will be taking turns next few days to make sure someone is present or available in case he develops any complications.  Diet:  No concentrated sweets  Activity:  As tolerated  Condition at Discharge:   stable  Signed: Dr. Lurline Del [phone]  05/03/2013, 8:06 AM

## 2013-05-04 ENCOUNTER — Ambulatory Visit (HOSPITAL_BASED_OUTPATIENT_CLINIC_OR_DEPARTMENT_OTHER): Payer: Medicare Other

## 2013-05-04 VITALS — BP 120/47 | HR 71 | Temp 98.1°F | Resp 18

## 2013-05-04 DIAGNOSIS — C8590 Non-Hodgkin lymphoma, unspecified, unspecified site: Secondary | ICD-10-CM

## 2013-05-04 DIAGNOSIS — C8589 Other specified types of non-Hodgkin lymphoma, extranodal and solid organ sites: Secondary | ICD-10-CM

## 2013-05-04 MED ORDER — PEGFILGRASTIM INJECTION 6 MG/0.6ML
6.0000 mg | Freq: Once | SUBCUTANEOUS | Status: AC
  Administered 2013-05-04: 6 mg via SUBCUTANEOUS

## 2013-05-04 NOTE — Patient Instructions (Signed)

## 2013-05-08 ENCOUNTER — Other Ambulatory Visit (HOSPITAL_BASED_OUTPATIENT_CLINIC_OR_DEPARTMENT_OTHER): Payer: Medicare Other

## 2013-05-08 ENCOUNTER — Ambulatory Visit (HOSPITAL_BASED_OUTPATIENT_CLINIC_OR_DEPARTMENT_OTHER): Payer: BLUE CROSS/BLUE SHIELD | Admitting: Hematology and Oncology

## 2013-05-08 VITALS — BP 111/69 | HR 68 | Temp 97.9°F | Resp 18 | Ht 69.0 in | Wt 190.6 lb

## 2013-05-08 DIAGNOSIS — D61818 Other pancytopenia: Secondary | ICD-10-CM | POA: Diagnosis not present

## 2013-05-08 DIAGNOSIS — C8590 Non-Hodgkin lymphoma, unspecified, unspecified site: Secondary | ICD-10-CM

## 2013-05-08 DIAGNOSIS — C8299 Follicular lymphoma, unspecified, extranodal and solid organ sites: Secondary | ICD-10-CM

## 2013-05-08 DIAGNOSIS — C8589 Other specified types of non-Hodgkin lymphoma, extranodal and solid organ sites: Secondary | ICD-10-CM

## 2013-05-08 LAB — CBC WITH DIFFERENTIAL/PLATELET
BASO%: 2 % (ref 0.0–2.0)
BASOS ABS: 0 10*3/uL (ref 0.0–0.1)
EOS ABS: 0 10*3/uL (ref 0.0–0.5)
EOS%: 10.3 % — ABNORMAL HIGH (ref 0.0–7.0)
HCT: 30.2 % — ABNORMAL LOW (ref 38.4–49.9)
HEMOGLOBIN: 10.2 g/dL — AB (ref 13.0–17.1)
LYMPH#: 0.2 10*3/uL — AB (ref 0.9–3.3)
LYMPH%: 42.3 % (ref 14.0–49.0)
MCH: 27.8 pg (ref 27.2–33.4)
MCHC: 33.8 g/dL (ref 32.0–36.0)
MCV: 82.1 fL (ref 79.3–98.0)
MONO#: 0.1 10*3/uL (ref 0.1–0.9)
MONO%: 17.9 % — ABNORMAL HIGH (ref 0.0–14.0)
NEUT%: 27.5 % — AB (ref 39.0–75.0)
NEUTROS ABS: 0.1 10*3/uL — AB (ref 1.5–6.5)
Platelets: 78 10*3/uL — ABNORMAL LOW (ref 140–400)
RBC: 3.68 10*6/uL — ABNORMAL LOW (ref 4.20–5.82)
RDW: 15.1 % — AB (ref 11.0–14.6)
WBC: 0.4 10*3/uL — CL (ref 4.0–10.3)

## 2013-05-08 LAB — COMPREHENSIVE METABOLIC PANEL (CC13)
ALBUMIN: 3.7 g/dL (ref 3.5–5.0)
ALT: 55 U/L (ref 0–55)
ANION GAP: 10 meq/L (ref 3–11)
AST: 18 U/L (ref 5–34)
Alkaline Phosphatase: 54 U/L (ref 40–150)
BUN: 23.2 mg/dL (ref 7.0–26.0)
CALCIUM: 9.2 mg/dL (ref 8.4–10.4)
CHLORIDE: 104 meq/L (ref 98–109)
CO2: 23 mEq/L (ref 22–29)
Creatinine: 1 mg/dL (ref 0.7–1.3)
GLUCOSE: 161 mg/dL — AB (ref 70–140)
POTASSIUM: 3.7 meq/L (ref 3.5–5.1)
Sodium: 137 mEq/L (ref 136–145)
Total Bilirubin: 0.52 mg/dL (ref 0.20–1.20)
Total Protein: 6.6 g/dL (ref 6.4–8.3)

## 2013-05-08 NOTE — Progress Notes (Signed)
ID: [name] OB: 1931/09/12  MR#: [id number]  MGQ#:[id number]  PCP: Michael Pel, MD GYN:   SU: [name] OTHER MD: [name], [name]  CHIEF COMPLAINT:  Patient is here for follow up after first cycle of RICE chemotherapy   HISTORY OF PRESENT ILLNESS:  Dr. Virgie [name] original note dated 06/28/2004:  The patient developed right upper quadrant pain last year and he had an ultrasound April 5th which showed some gallstones without evidence of cholecystitis or ductal dilatation.  However, there were multiple lesions in the liver which could not be assessed further.  Accordingly on July 31, 2003, [name], showed multiple liver masses, more than 25, most over 1 cm, the largest being in the inferior right lobe, measuring 4.2 cm. There was also a small mass in the spleen.  CT of the pelvis was unremarkable.   The patient had a biopsy of the liver August 01, 2003.  The report says only that it was a lesion in the right lobe of the liver.  Presumably this was the largest lesion present.  The final pathology [id number] and [phone]) showed only cirrhosis.    The patient has been followed by [name], and a repeat CT scan of the abdomen and pelvis was obtained May 18, 2004.  Many of the liver lesions seen previously had actually decreased in size.  However, [name], [name], a second ultrasound-guided biopsy was performed 06/11/04. This was a lesion deep in the left lobe of the liver and therefore, I would think not the same one previously biopsied which was in the right liver. The pathology this time [id number]) shows  a poorly differentiated neuroendocrine carcinoma which was positive for chromogranin A, negative for synaptophysin, thyroid transcription factor, a variety of cytokeratins, [name], alpha-fetoprotein and COX-2."  [name] was subsequently evaluated at [name] by Dr. Otelia [name] and repeat liver biopsy and review of the earlier biopsy here showed a primary hepatic lymphoma. The patient was treated with R-CHOP as detailed below and achieved a complete response. He received maintenance rituximab until 2008  More recently the patient has been complaining of worsening back pain. It felt "like a kidney stone".  INTERVAL HISTORY: [name] is a 80 years old pleasant [name] with recurrent NHL is here for followup after first cycle of Rice chemotherapy. He received Neulasta on 05/04/2013. He also underwent laparoscopic cholecystectomy and port placement on 04/04/2013. Today he denies any fever, chills, shortness of breath ,chest pain, palpitation denies any constipation ,blood in the stool ,blood in the urine. He does complain of mild tenderness at the surgical site but he says that it is improving when compared to before.  REVIEW OF SYSTEMS: All 14 review of systems has been assessed and pertinent symptoms are mentioned above.  PAST MEDICAL HISTORY: Past Medical History  Diagnosis Date   Hypertension    Cancer of liver    Kidney stone    Prostate cancer    Skin cancer    Arthritis    Anemia    GERD (gastroesophageal reflux disease)    Lymphoma  [name]    Sleep apnea    Neuropathy in diabetes     Hx: of   Diabetes mellitus     INSULIN DEPENDENT  Significant for prostate cancer, the patient undergoing prostatectomy January 2001 under [name] for a Gleason 7, [name], minor coronary artery disease,  hypertension, hypertriglyceridemia, cholelithiasis, colon polyps, cirrhosis by biopsy, diabetes, squamous cell carcinomas of the skin removed by [name], history of nephrolithiasis, status post right renal surgery and history of left rotator cuff repair under [name].    PAST SURGICAL HISTORY: Past Surgical History  Procedure Laterality Date   Prostatectomy     Kidney stone surgery     Rotator cuff repair     Ankle surgery     Cardiac catheterization      Hx: of 1970's   Colonoscopy      Hx: of   Infusion port  04/04/2013    RIGHT SUBCLAVIAN   Cholecystectomy  04/04/2013   Cholecystectomy N/A 04/04/2013    Procedure: LAPAROSCOPIC CHOLECYSTECTOMY WITH INTRAOPERATIVE CHOLANGIOGRAM;  Surgeon: Michael Regal, MD;  Location: [name];  Service: General;  [name];   Portacath placement N/A 04/04/2013    Procedure: INSERTION PORT-A-CATH;  Surgeon: Michael Regal, MD;  Location: [name];  Service: General;  [name];    FAMILY HISTORY Family History  Problem Relation Age of Onset   Heart disease Father   The patients father died at the age of 37 from an MI.  The patients mother died from old age at 63.  The patient is one of nine siblings.  One brother died from cancer of the esophagus, 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, [name], a homemaker, is present today as are the patient's three daughters: [name] is an Scientist, physiological for [employer] [name] [employer]; [name], [name]; and [name] is Environmental consultant.  Everybody lives in [place].  The patient has five grandchildren.  He is a member of [place].    ADVANCED DIRECTIVES:  in place   HEALTH MAINTENANCE: History  Substance Use Topics   Smoking status: Former Smoker    Types: Cigarettes, Pipe, Cigars   Smokeless tobacco: Never Used     Comment: QUIT SMOKING MANY YEARS AGO "   Alcohol Use: No     Colonoscopy:  PSA: 8.01 (NOV 2014)  Bone density:  Lipid panel:  Allergies  Allergen Reactions   Niacin Other (See Comments)    headaches    Current Outpatient Prescriptions  Medication Sig Dispense Refill   allopurinol (ZYLOPRIM) 300 MG tablet Take 1 tablet (300 mg total) by mouth daily.  30 tablet  3   amLODipine (NORVASC) 5 MG tablet Take 5 mg by mouth daily.       aspirin 81 MG tablet Take 81 mg by mouth daily.       atenolol (TENORMIN) 100 MG tablet Take 100 mg by mouth daily.       atorvastatin (LIPITOR) 20 MG tablet Take 20 mg by mouth daily.  Cyanocobalamin (VITAMIN B 12 PO) Take 2,500 mg by mouth daily.        ezetimibe (ZETIA) 10 MG tablet Take 10 mg by mouth daily.       fenofibrate 160 MG tablet Take 160 mg by mouth daily.       gabapentin (NEURONTIN) 300 MG capsule Take 300 mg by mouth 2 (two) times daily.       hydrochlorothiazide (HYDRODIURIL) 25 MG tablet Take 25 mg by mouth daily.       insulin NPH (HUMULIN N,NOVOLIN N) 100 UNIT/ML injection Inject 34-36 Units into the skin. 36 units the morning and 34 after supper       lisinopril (PRINIVIL,ZESTRIL) 40 MG tablet Take 40 mg by mouth daily.       metFORMIN (GLUCOPHAGE) 1000 MG tablet Take 1,000 mg by mouth 2 (two) times daily with a meal.       Multiple Vitamins-Minerals (MULTIVITAMIN WITH MINERALS) tablet Take 1 tablet by mouth daily.       prochlorperazine (COMPAZINE) 10 MG tablet Take 1 tablet (10 mg total) by mouth every 6 (six) hours as needed for nausea or vomiting.  30 tablet  0   sertraline (ZOLOFT) 100 MG tablet Take 100 mg by mouth daily.       cetirizine (ZYRTEC) 10 MG tablet Take 10 mg by mouth daily as needed for allergies.        insulin NPH Human  (HUMULIN N,NOVOLIN N) 100 UNIT/ML injection Inject 36 Units into the skin daily before breakfast.  10 mL  11   No current facility-administered medications for this visit.     Filed Vitals:   05/08/13 1125  BP: 111/69  Pulse: 68  Temp: 97.9 F (36.6 C)  Resp: 18     Body mass index is 28.13 kg/(m^2).      ECOG FS:1 - Symptomatic but completely ambulatory  Ocular: Sclerae unicteric, pupils equal, round and reactive to light Ear-nose-throat: Oropharynx clear,  no thrush or other lesions  Lymphatic: No cervical or supraclavicular adenopathy; no axillary or inguinal adenopathy  Lungs no rales or rhonchi, good excursion bilaterally Heart regular rate and rhythm, no murmur appreciated Abd soft, nontender, positive bowel sounds, no masses palpated  MSK no focal spinal tenderness, no joint edema Neuro: non-focal, well-oriented,  positive  affect  LAB RESULTS:  CMP     Component Value Date/Time   NA 137 05/08/2013 1051   NA 137 05/03/2013 0500   K 3.7 05/08/2013 1051   K 3.5* 05/03/2013 0500   CL 93* 05/03/2013 0500   CO2 23 05/08/2013 1051   CO2 31 05/03/2013 0500   GLUCOSE 161* 05/08/2013 1051   GLUCOSE 189* 05/03/2013 0500   BUN 23.2 05/08/2013 1051   BUN 26* 05/03/2013 0500   CREATININE 1.0 05/08/2013 1051   CREATININE 0.90 05/03/2013 0500   CALCIUM 9.2 05/08/2013 1051   CALCIUM 8.9 05/03/2013 0500   CALCIUM 11.0* 08/17/2005 0842   PROT 6.6 05/08/2013 1051   PROT 5.9* 05/03/2013 0500   ALBUMIN 3.7 05/08/2013 1051   ALBUMIN 3.3* 05/03/2013 0500   AST 18 05/08/2013 1051   AST 43* 05/03/2013 0500   ALT 55 05/08/2013 1051   ALT 55* 05/03/2013 0500   ALKPHOS 54 05/08/2013 1051   ALKPHOS 41 05/03/2013 0500   BILITOT 0.52 05/08/2013 1051   BILITOT 0.3 05/03/2013 0500   GFRNONAA 79* 05/03/2013 0500   GFRAA >90 05/03/2013 0500   Results for SHELVY, PERAZZO (MRN [id number]) as of  03/25/2013 18:32  Ref. Range 12/03/2009 08:35 03/29/2010 08:43 08/25/2010 09:31 03/02/2011 08:13 03/20/2013 09:16  LDH Latest Range: 125-245  U/L 135 119 151 128 157   IResults for KALIF, KATTNER (MRN [id number]) as of 03/25/2013 18:32  Ref. Range 12/03/2009 08:35 03/29/2010 08:43 08/25/2010 09:31 03/02/2011 08:13 03/20/2013 09:20  Beta-2 Microglobulin Latest Range: 1.01-1.73 mg/L 3.36 (H) 2.85 (H) 2.50 (H) 2.04 (H) 3.00 (H)   No results found for this basename: SPEP,  UPEP,   kappa and lambda light chains    Lab Results  Component Value Date   WBC 0.4* 05/08/2013   NEUTROABS 0.1* 05/08/2013   HGB 10.2* 05/08/2013   HCT 30.2* 05/08/2013   MCV 82.1 05/08/2013   PLT 78* 05/08/2013      Chemistry      Component Value Date/Time   NA 137 05/08/2013 1051   NA 137 05/03/2013 0500   K 3.7 05/08/2013 1051   K 3.5* 05/03/2013 0500   CL 93* 05/03/2013 0500   CO2 23 05/08/2013 1051   CO2 31 05/03/2013 0500   BUN 23.2 05/08/2013 1051   BUN 26* 05/03/2013 0500   CREATININE 1.0 05/08/2013 1051   CREATININE 0.90 05/03/2013 0500      Component Value Date/Time   CALCIUM 9.2 05/08/2013 1051   CALCIUM 8.9 05/03/2013 0500   CALCIUM 11.0* 08/17/2005 0842   ALKPHOS 54 05/08/2013 1051   ALKPHOS 41 05/03/2013 0500   AST 18 05/08/2013 1051   AST 43* 05/03/2013 0500   ALT 55 05/08/2013 1051   ALT 55* 05/03/2013 0500   BILITOT 0.52 05/08/2013 1051   BILITOT 0.3 05/03/2013 0500      Urinalysis    Component Value Date/Time   COLORURINE YELLOW 05/02/2013 0805   APPEARANCEUR CLEAR 05/02/2013 0805   LABSPEC 1.008 05/02/2013 0805   PHURINE 7.5 05/02/2013 0805   GLUCOSEU NEGATIVE 05/02/2013 0805   HGBUR NEGATIVE 05/02/2013 0805   BILIRUBINUR NEGATIVE 05/02/2013 0805   KETONESUR >80* 05/02/2013 0805   PROTEINUR NEGATIVE 05/02/2013 0805   UROBILINOGEN 0.2 05/02/2013 0805   NITRITE NEGATIVE 05/02/2013 0805   LEUKOCYTESUR NEGATIVE 05/02/2013 0805    STUDIES: Ct Abdomen Pelvis Wo Contrast  03/07/2013   CLINICAL DATA:  Back pain and right flank pain for 1 week. Dysuria. Diarrhea 1 week ago. History of prostatectomy 15 years ago. History of hepatocellular carcinoma with chemotherapy 3 years  ago. Diabetes. Skin cancer.  EXAM: CT ABDOMEN AND PELVIS WITHOUT CONTRAST  TECHNIQUE: Multidetector CT imaging of the abdomen and pelvis was performed following the standard protocol without intravenous contrast.  COMPARISON:  10/13/2005  FINDINGS: Lower Chest: Bibasilar scarring or atelectasis. Mild cardiomegaly with dense coronary artery atherosclerosis. No pericardial or pleural effusion.  Abdomen/Pelvis: Cirrhosis. Scattered right hepatic lobe calcifications without well-defined residual or recurrent mass. There is a 6 mm low-density focus adjacent the gallbladder on image 31/ series 2 which is nonspecific but was likely present on the prior exam.  Lateral segment left liver lobe heterogeneous calcifications are at the site of a dominant mass on the prior exam. Likely the sequelae of prior chemoembolization.  Normal spleen, stomach, left adrenal gland. Mild right adrenal nodularity is new and measures 1.4 cm on image 25/ series 2. Twenty-eight HU.  Moderate pancreatic atrophy. Cholelithiasis without acute cholecystitis or biliary ductal dilatation.  Scarred upper pole right kidney. No renal calculi or hydronephrosis. No hydroureter or ureteric calculi.  Soft tissue mass is identified within the periaortic and pericaval space. This is difficult to differentiate  from the surrounding vasculature due to lack of IV contrast. The conglomerate of mass and vessels measures 6.5 x 5.5 cm on image 36/series 2. This continues in the retrocaval/ retroaortic space more inferiorly.  Underline aortic atherosclerosis.  No retrocrural adenopathy.  Scattered colonic diverticula. Normal terminal ileum and appendix. Normal small bowel without abdominal ascites. No evidence of omental or peritoneal disease.  Prior pelvic sidewall node dissection. No pelvic adenopathy. Normal urinary bladder. Prostate surgically absent by report. Bilateral left greater than right fat containing inguinal hernias. No significant free fluid.   Bones/Musculoskeletal: Advanced degenerative disc disease, including at L2-3 and L5-S1. Trace L5-S1 anterolisthesis.  IMPRESSION: 1.  No urinary tract calculi or hydronephrosis. 2. Soft tissue mass within the periaortic/ pericaval space of the retroperitoneum. Suspicious for lymphoma. Metastatic disease from one of the patient's primaries is felt much less likely. Consider nonemergent PET to direct tissue sampling. These results were called by telephone at the time of interpretation on 03/07/2013 at 11:02 AM to Dr. Katheren Shams , who verbally acknowledged these results. 3. Cholelithiasis. 4. Cirrhosis with treatment changes in the liver. No convincing evidence of recurrent or residual metastasis. 5. Right adrenal nodule which is indeterminate and new since 10/13/2005. This could also be evaluated at PET.   Electronically Signed   By: Abigail Miyamoto M.D.   On: 03/07/2013 11:03   Ct Foot Right Wo Contrast  03/04/2013   CLINICAL DATA:  Right foot pain and swelling for 7-10 days. History of Charcot change.  EXAM: CT OF THE RIGHT FOOT WITHOUT CONTRAST  TECHNIQUE: Multidetector CT imaging was performed according to the standard protocol. Multiplanar CT image reconstructions were also generated.  COMPARISON:  None.  FINDINGS: No fracture or dislocation is identified. There is extensive joint space narrowing with subchondral cyst formation and osteophytosis throughout the midfoot. Scattered small bony fragments are also identified and best seen about the 2nd, 3rd and 4th tarsometatarsal joints. No bony destructive change to suggest osteomyelitis is present. No foreign body or soft tissue gas collection is seen. No fluid collection is identified. Extensive atherosclerotic vascular disease is noted.  IMPRESSION: No acute or focal abnormality.  Findings in the midfoot consistent with advanced osteoarthritis and mild to moderate neuropathic change are identified as described above.  Atherosclerosis.   Electronically Signed    By: Inge Rise M.D.   On: 03/04/2013 15:49   Ct Biopsy  03/19/2013   CLINICAL DATA:  80 year old [name] with a history of a both a pancreatic adenocarcinoma, and lymphoma now with an enlarging hypermetabolic (PET positive) para aortic retroperitoneal mass. CT-guided biopsy is requested to facilitate tissue diagnosis.  EXAM: CT GUIDED CORE BIOPSY OF PARA AORTIC RETROPERITONEAL MASS  ANESTHESIA/SEDATION: Two mg IV Versed; 100 mcg IV Fentanyl  Total Moderate Sedation Time: 2 minutes.  PROCEDURE: The procedure risks, benefits, and alternatives were explained to the patient. Questions regarding the procedure were encouraged and answered. The patient understands and consents to the procedure.  The right back was prepped with Betadinein a sterile fashion, and a sterile drape was applied covering the operative field. A sterile gown and sterile gloves were used for the procedure. Local anesthesia was provided with 1% Lidocaine.  A planning axial CT scan was performed. The periaortic mass was successfully identified. A suitable skin entry site was selected and marked. Local anesthesia was attained by infiltration with 1% lidocaine. A small dermatotomy was made. Using intermittent CT guidance, a 15 cm 17 gauge trocar needle was carefully advanced through the right  back, retroperitoneal space and into the margin of the periaortic mass. Multiple 18 gauge core biopsies were then coaxially obtained using the 18 gauge BioPince automated biopsy device.  Post biopsy axial CT imaging and demonstrates no evidence of immediate complication. Locules of gas are present within the biopsy site.  Complications: None immediate  FINDINGS: Successful identification of a periaortic retroperitoneal soft tissue mass.  IMPRESSION: Technically successful CT-guided biopsy of periaortic retroperitoneal mass.  Signed,  Criselda Peaches, MD  [facility]  [name]   Electronically Signed   By:  Jacqulynn Cadet M.D.   On: 03/19/2013 15:18   Patient: KARRY, CAUSER Collected: 03/19/2013 Client: [place] Accession: [id number] Received: 03/19/2013 Jacqulynn Cadet, MD DOB: [birth year] Age: 69 Gender: M Reported: 03/22/2013 [address] Patient Ph: [phone] MRN #: [id number] [place], [place] [address] Visit #: [id number].[email or web address] Chart #: Phone:  Fax: CC: Michael Gemma, MD REPORT OF SURGICAL PATHOLOGY FINAL DIAGNOSIS Diagnosis Retroperitoneal mass, biopsy - DIFFUSE LARGE B-CELL LYMPHOMA, CLINICALLY RECURRENT. - SEE ONCOLOGY TABLE. Microscopic Comment LYMPHOMA Histologic type: Non-Hodgkin lymphoma- diffuse large B-cell lymphoma Grade (if applicable): High grade. Flow cytometry: Not performed. Immunohistochemical stains: CD45, CD20, CD3, CD10, pancytokeratin. Touch preps/imprints: N/A. Comments: The biopsy consists predominately of necrotic tissue. There are a few foci of viable large highly atypical cells with irregular nuclear contours and prominent nucleoli. Immunohistochemistry reveals the cells are positive for CD45, CD20. CD3 highlights a few background T-completely T-cells. Pancytokeratin is negative. CD10 is non-contributory due to high levels of background staining. Overall, the morphologic and immunophenotypic findings are consistent with a diffuse large B-cell lymphoma. Given the patient's history these findings indicate recurrent disease. Vicente Males MD Pathologist, Electronic Signature (Case signed 03/22/2013)    ASSESSMENT/PLAN:  80 y.o. [place] man with a diagnosis of    #1 Primary hepatic non-Hodgkin's lymphoma established through liver biopsy February 2006, treated with Rituxan x9 and then CHOP x6.  All chemotherapy completed in 2007.  Maintenance Rituxan discontinued October 2008.     #2  Biopsy proven retroperitoneal recurrence documented November 2014. Bone marrow biopsy 04/01/2013 was negative   #3 Cycle #1  R-ICE  chemotherapy  completed on 05/02/2013.   #4 Neulasta given on 05/04/2013  #5 Now manifesting with pancytopenia secondary to chemotherapy-induced: I instructed the patient on neutropenic precautions. We'll repeat the CBC and differential and CMP on 05/13/2013  #6 Plan to initiate second cycle of R-ICE chemotherapy once the counts improve.  #7 followup visit on 05/13/2013  #8 Patient to followup with [facility] transplant team for possible autotransplant after second cycle of RICE chemotherapy with repeat scans     Wilmon Arms, MD   05/08/2013 4:57 PM

## 2013-05-13 ENCOUNTER — Ambulatory Visit (HOSPITAL_BASED_OUTPATIENT_CLINIC_OR_DEPARTMENT_OTHER): Payer: Medicare Other | Admitting: Physician Assistant

## 2013-05-13 ENCOUNTER — Ambulatory Visit: Payer: Medicare Other | Admitting: Oncology

## 2013-05-13 ENCOUNTER — Other Ambulatory Visit (HOSPITAL_BASED_OUTPATIENT_CLINIC_OR_DEPARTMENT_OTHER): Payer: Medicare Other

## 2013-05-13 ENCOUNTER — Encounter: Payer: Self-pay | Admitting: Physician Assistant

## 2013-05-13 VITALS — BP 126/66 | HR 75 | Temp 98.3°F | Resp 18 | Ht 69.0 in | Wt 190.8 lb

## 2013-05-13 DIAGNOSIS — C8583 Other specified types of non-Hodgkin lymphoma, intra-abdominal lymph nodes: Secondary | ICD-10-CM | POA: Diagnosis not present

## 2013-05-13 DIAGNOSIS — D696 Thrombocytopenia, unspecified: Secondary | ICD-10-CM | POA: Diagnosis not present

## 2013-05-13 DIAGNOSIS — D649 Anemia, unspecified: Secondary | ICD-10-CM

## 2013-05-13 DIAGNOSIS — C8590 Non-Hodgkin lymphoma, unspecified, unspecified site: Secondary | ICD-10-CM

## 2013-05-13 DIAGNOSIS — D61818 Other pancytopenia: Secondary | ICD-10-CM

## 2013-05-13 DIAGNOSIS — D630 Anemia in neoplastic disease: Secondary | ICD-10-CM

## 2013-05-13 LAB — COMPREHENSIVE METABOLIC PANEL (CC13)
ALBUMIN: 3.6 g/dL (ref 3.5–5.0)
ALK PHOS: 68 U/L (ref 40–150)
ALT: 43 U/L (ref 0–55)
AST: 31 U/L (ref 5–34)
Anion Gap: 13 mEq/L — ABNORMAL HIGH (ref 3–11)
BUN: 10.5 mg/dL (ref 7.0–26.0)
CHLORIDE: 104 meq/L (ref 98–109)
CO2: 25 mEq/L (ref 22–29)
Calcium: 8.9 mg/dL (ref 8.4–10.4)
Creatinine: 0.9 mg/dL (ref 0.7–1.3)
GLUCOSE: 150 mg/dL — AB (ref 70–140)
POTASSIUM: 3.6 meq/L (ref 3.5–5.1)
Sodium: 141 mEq/L (ref 136–145)
Total Bilirubin: 0.25 mg/dL (ref 0.20–1.20)
Total Protein: 6.7 g/dL (ref 6.4–8.3)

## 2013-05-13 LAB — CBC WITH DIFFERENTIAL/PLATELET
BASO%: 0 % (ref 0.0–2.0)
BASOS ABS: 0 10*3/uL (ref 0.0–0.1)
EOS ABS: 0.8 10*3/uL — AB (ref 0.0–0.5)
EOS%: 5.6 % (ref 0.0–7.0)
HCT: 29.4 % — ABNORMAL LOW (ref 38.4–49.9)
HEMOGLOBIN: 9.9 g/dL — AB (ref 13.0–17.1)
LYMPH#: 0.4 10*3/uL — AB (ref 0.9–3.3)
LYMPH%: 3.2 % — ABNORMAL LOW (ref 14.0–49.0)
MCH: 27.4 pg (ref 27.2–33.4)
MCHC: 33.6 g/dL (ref 32.0–36.0)
MCV: 81.6 fL (ref 79.3–98.0)
MONO#: 0.5 10*3/uL (ref 0.1–0.9)
MONO%: 3.5 % (ref 0.0–14.0)
NEUT#: 11.9 10*3/uL — ABNORMAL HIGH (ref 1.5–6.5)
NEUT%: 87.7 % — ABNORMAL HIGH (ref 39.0–75.0)
Platelets: 64 10*3/uL — ABNORMAL LOW (ref 140–400)
RBC: 3.6 10*6/uL — AB (ref 4.20–5.82)
RDW: 15.4 % — AB (ref 11.0–14.6)
WBC: 13.6 10*3/uL — AB (ref 4.0–10.3)

## 2013-05-13 NOTE — Progress Notes (Signed)
ID: [name] OB: 22-Dec-1931  MR#: [id number]  KCL#:[id number]  PCP: Horatio Pel, MD GYN:   SU: [name] OTHER MD: [name], [name]  CHIEF COMPLAINT:  Non-Hodgkin's lymphoma, currently status post 1 cycle of RICE chemotherapy on 04/29/2013.   HISTORY OF PRESENT ILLNESS:  Dr. [name] original note dated 06/28/2004:  The patient developed right upper quadrant pain last year and he had an ultrasound April 5th which showed some gallstones without evidence of cholecystitis or ductal dilatation.  However, there were multiple lesions in the liver which could not be assessed further.  Accordingly on July 31, 2003, [name], showed multiple liver masses, more than 25, most over 1 cm, the largest being in the inferior right lobe, measuring 4.2 cm. There was also a small mass in the spleen.  CT of the pelvis was unremarkable.   The patient had a biopsy of the liver August 01, 2003.  The report says only that it was a lesion in the right lobe of the liver.  Presumably this was the largest lesion present.  The final pathology [id number] and [id number]) showed only cirrhosis.    The patient has been followed by [name], and a repeat CT scan of the abdomen and pelvis was obtained May 18, 2004.  Many of the liver lesions seen previously had actually decreased in size.  However, [name], [name], a second ultrasound-guided biopsy was performed 06/11/04. This was a lesion deep in the left lobe of the liver and therefore, I would think not the same one previously biopsied which was in the right liver. The pathology this  time [id number]) shows a poorly differentiated neuroendocrine carcinoma which was positive for chromogranin A, negative for synaptophysin, thyroid transcription factor, a variety of cytokeratins, [name], alpha-fetoprotein and COX-2."  [name] was subsequently evaluated at [facility] by Dr. Otelia Limes and repeat liver biopsy and review of the earlier biopsy here showed a primary hepatic lymphoma. The patient was treated with R-CHOP as detailed below and achieved a complete response. He received maintenance rituximab until 2008  More recently the patient has been complaining of worsening back pain. It felt "like a kidney stone".  INTERVAL HISTORY:  [name] returns today accompanied by his daughter [name] for followup of his non-Hodgkin's lymphoma. He had his first dose of RICE on 04/29/2013, given as an inpatient. He is now 2 weeks out from the first cycle, and has recovered nicely. He had pancytopenia one week ago, but his white counts have recovered nicely today with a WBC of 13.6, and an ANC of 11.9.  He has had no fevers or chills.  He is here to discuss possible admission today to initiate day 1 cycle 2 of therapy.  [name] is feeling well with no new complaints. His energy level is fairly good. He does have some mild blurred vision. He has occasional headaches, primarily in the morning when he first wakes up, but these resolve quickly and easily. He does not take pain medication for the headaches. He denies any dizziness.  He complains of some sinus congestion.  REVIEW OF SYSTEMS:  [name] denies any abnormal bruising or bleeding. He's had no rashes or skin changes. His appetite is good. He 8 some greasy food last week which cause some nausea and heartburn. He also had 4 episodes of loose stools which she attributes to his diet. Otherwise, he has had no GI changes. He has occasional dry cough which is stable. He denies any increased shortness of breath and has had no chest pain or  palpitations. He has some slight tingling in his fingertips at times, but he describes this as mild, and is not affecting any of his day-to-day activities or abilities to perform fine motor skills. He currently denies any pain, no unusual myalgias, arthralgias, or bony pain. She's had no peripheral swelling.  A detailed review of systems is otherwise stable and noncontributory.   PAST MEDICAL HISTORY: Past Medical History  Diagnosis Date   Hypertension    Cancer of liver    Kidney stone    Prostate cancer    Skin cancer    Arthritis    Anemia    GERD (gastroesophageal reflux disease)    Lymphoma    [name]    Sleep apnea    Neuropathy in diabetes     Hx: of   Diabetes mellitus     INSULIN DEPENDENT  Significant for prostate cancer, the patient undergoing prostatectomy January 2001 under [facility] for a Gleason 7, [name], minor coronary artery disease, hypertension, hypertriglyceridemia, cholelithiasis, colon polyps, cirrhosis by biopsy, diabetes, [name], history of nephrolithiasis, status post right renal surgery and history of left rotator cuff repair under [name].    PAST SURGICAL HISTORY: Past Surgical History  Procedure Laterality Date   Prostatectomy     Kidney stone surgery     Rotator cuff repair     Ankle surgery     Cardiac catheterization      Hx: of 1970's   Colonoscopy      Hx: of   Infusion port  04/04/2013    RIGHT SUBCLAVIAN   Cholecystectomy  04/04/2013   Cholecystectomy N/A 04/04/2013    Procedure: LAPAROSCOPIC CHOLECYSTECTOMY WITH INTRAOPERATIVE CHOLANGIOGRAM;  [name], MD;  Location: [place];  Service: General;  [name];   Portacath placement N/A 04/04/2013    Procedure:  INSERTION PORT-A-CATH;  [name], MD;  Location: [place];  Service: General;  [name];    FAMILY HISTORY Family History  Problem Relation Age of Onset   Heart disease Father   The patients father died at the age of 77 from an MI.  The patients mother died from old age at 28.  The patient is one of nine siblings.  One brother died from cancer of the esophagus, 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, [name], a homemaker, is present today as are the patient's three daughters: [name] is an Scientist, physiological for [employer]; [name], [name]; and [name] is Environmental consultant.  Everybody lives in  [place].  The patient has five grandchildren.  He is a member of [place] [employer].    ADVANCED DIRECTIVES: in place   HEALTH MAINTENANCE: History  Substance Use Topics   Smoking status: Former Smoker    Types: Cigarettes, Pipe, Cigars   Smokeless tobacco: Never Used     Comment: QUIT SMOKING MANY YEARS AGO "   Alcohol Use: No     Colonoscopy:  PSA: 8.01 (NOV 2014)  Bone density:  Lipid panel:  Allergies  Allergen Reactions   Niacin Other (See Comments)    headaches    Current Outpatient Prescriptions  Medication Sig Dispense Refill   allopurinol (ZYLOPRIM) 300 MG tablet Take 1 tablet (300 mg total) by mouth daily.  30 tablet  3   amLODipine (NORVASC) 5 MG tablet Take 5 mg by mouth daily.       aspirin 81 MG tablet Take 81 mg by mouth daily.       atenolol (TENORMIN) 100 MG tablet Take 100 mg by mouth daily.       atorvastatin (LIPITOR) 20 MG tablet Take 20 mg by mouth daily.       cetirizine (ZYRTEC) 10 MG tablet Take  10 mg by mouth daily as needed for allergies.        cholecalciferol (VITAMIN D) 1000 UNITS tablet Take 1,000 Units by mouth daily.       Cyanocobalamin (VITAMIN B 12 PO) Take 2,500 mg by mouth daily.        ezetimibe (ZETIA) 10 MG tablet Take 10 mg by mouth daily.       fenofibrate 160 MG tablet Take 160 mg by mouth daily.       gabapentin (NEURONTIN) 300 MG capsule Take 300 mg by mouth 2 (two) times daily.       hydrochlorothiazide (HYDRODIURIL) 25 MG tablet Take 25 mg by mouth daily.       insulin NPH (HUMULIN N,NOVOLIN N) 100 UNIT/ML injection Inject 34-36 Units into the skin. 36 units the morning and 34 after supper       insulin NPH Human (HUMULIN N,NOVOLIN N) 100 UNIT/ML injection Inject 36 Units into the skin daily before breakfast.  10 mL  11   lisinopril (PRINIVIL,ZESTRIL) 40 MG tablet Take 40 mg by mouth daily.       metFORMIN (GLUCOPHAGE) 1000 MG tablet Take 1,000 mg by mouth 2 (two) times daily with a meal.       Multiple Vitamins-Minerals (MULTIVITAMIN WITH MINERALS) tablet Take 1 tablet by mouth daily.       prochlorperazine (COMPAZINE) 10 MG tablet Take 1 tablet (10 mg total) by mouth every 6 (six) hours as needed for nausea or vomiting.  30 tablet  0   sertraline (ZOLOFT) 100 MG tablet Take 100 mg by mouth daily.       vitamin C (ASCORBIC ACID) 500 MG tablet Take 500 mg by mouth daily.       No current facility-administered medications for this visit.     Filed Vitals:   05/13/13 1456  BP: 126/66  Pulse: 75  Temp: 98.3 F (36.8 C)  Resp: 18     Body mass index is 28.16 kg/(m^2).     Filed Weights   05/13/13 1456  Weight: 190 lb 12.8 oz (86.546 kg)  ECOG FS:1 - Symptomatic but completely ambulatory Elderly white man who appears comfortable and is in no acute distress  Physical Exam: HEENT:  Sclerae anicteric.  Oropharynx clear and moist. No  evidence of ulcerations or candidiasis. Neck is supple.  NODES:  No cervical, supraclavicular, or axillary  lymphadenopathy palpated.  LUNGS:  Clear to auscultation bilaterally.  No wheezes or rhonchi HEART:  Regular rate and rhythm. No murmur appreciated  ABDOMEN:  Soft, nontender.  Positive bowel sounds.  MSK:  No focal spinal tenderness to palpation. Good range of motion bilaterally in the upper and lower extremities. No joint swelling. EXTREMITIES:  No peripheral edema.   SKIN:  Benign with no visible rashes or skin lesions. No excessive ecchymoses. No petechiae. NEURO:  Nonfocal. Well oriented.  Appropriate affect.   LAB RESULTS:   Results for MANSON, LUCKADOO (MRN [id number]) as of 03/25/2013 18:32  Ref. Range 12/03/2009 08:35 03/29/2010 08:43 08/25/2010 09:31 03/02/2011 08:13 03/20/2013 09:16  LDH Latest Range: 125-245 U/L 135 119 151 128 157   IResults for ROWLAND, ERICSSON (MRN [id number]) as of 03/25/2013 18:32  Ref. Range 12/03/2009 08:35 03/29/2010 08:43 08/25/2010 09:31 03/02/2011 08:13 03/20/2013 09:20  Beta-2 Microglobulin Latest Range: 1.01-1.73 mg/L 3.36 (H) 2.85 (H) 2.50 (H) 2.04 (H) 3.00 (H)    Lab Results  Component Value Date   WBC 13.6* 05/13/2013   NEUTROABS 11.9* 05/13/2013   HGB 9.9* 05/13/2013   HCT 29.4* 05/13/2013   MCV 81.6 05/13/2013   PLT 64* 05/13/2013      Chemistry      Component Value Date/Time   NA 141 05/13/2013 1444   NA 137 05/03/2013 0500   K 3.6 05/13/2013 1444   K 3.5* 05/03/2013 0500   CL 93* 05/03/2013 0500   CO2 25 05/13/2013 1444   CO2 31 05/03/2013 0500   BUN 10.5 05/13/2013 1444   BUN 26* 05/03/2013 0500   CREATININE 0.9 05/13/2013 1444   CREATININE 0.90 05/03/2013 0500      Component Value Date/Time   CALCIUM 8.9 05/13/2013 1444   CALCIUM 8.9 05/03/2013 0500   CALCIUM 11.0* 08/17/2005 0842   ALKPHOS 68 05/13/2013 1444   ALKPHOS 41 05/03/2013 0500   AST 31 05/13/2013 1444   AST 43* 05/03/2013 0500   ALT 43 05/13/2013 1444   ALT 55* 05/03/2013 0500   BILITOT 0.25 05/13/2013 1444   BILITOT 0.3 05/03/2013 0500      STUDIES: Ct Abdomen Pelvis Wo Contrast  03/07/2013    CLINICAL DATA:  Back pain and right flank pain for 1 week. Dysuria. Diarrhea 1 week ago. History of prostatectomy 15 years ago. History of hepatocellular carcinoma with chemotherapy 3 years ago. Diabetes. Skin cancer.  EXAM: CT ABDOMEN AND PELVIS WITHOUT CONTRAST  TECHNIQUE: Multidetector CT imaging of the abdomen and pelvis was performed following the standard protocol without intravenous contrast.  COMPARISON:  10/13/2005  FINDINGS: Lower Chest: Bibasilar scarring or atelectasis. Mild cardiomegaly with dense coronary artery atherosclerosis. No pericardial or pleural effusion.  Abdomen/Pelvis: Cirrhosis. Scattered right hepatic lobe calcifications without well-defined residual or recurrent mass. There is a 6 mm low-density focus adjacent the gallbladder on image 31/ series 2 which is nonspecific but was likely present on the prior exam.  Lateral segment left liver lobe heterogeneous calcifications are at the site of a dominant mass on the prior exam. Likely the sequelae of prior chemoembolization.  Normal spleen, stomach, left adrenal gland. Mild right adrenal nodularity is new and measures 1.4 cm on image 25/ series 2. Twenty-eight HU.  Moderate pancreatic atrophy. Cholelithiasis without acute cholecystitis or biliary ductal dilatation.  Scarred upper pole right kidney. No renal calculi or hydronephrosis. No hydroureter or ureteric calculi.  Soft tissue mass is identified within the periaortic and pericaval space. This is difficult to differentiate from the surrounding vasculature due to lack of IV contrast. The conglomerate of mass and vessels measures 6.5 x 5.5 cm on image 36/series 2. This continues in the retrocaval/ retroaortic space more inferiorly.  Underline aortic atherosclerosis.  No retrocrural adenopathy.  Scattered colonic diverticula. Normal terminal ileum and appendix. Normal small bowel without abdominal ascites. No evidence of omental or peritoneal disease.  Prior pelvic sidewall node  dissection. No pelvic adenopathy. Normal urinary bladder. Prostate surgically absent by report. Bilateral left greater than right fat containing inguinal hernias. No significant free fluid.  Bones/Musculoskeletal: Advanced degenerative disc disease, including at L2-3 and L5-S1. Trace L5-S1 anterolisthesis.  IMPRESSION: 1.  No urinary tract calculi or hydronephrosis. 2. Soft tissue mass within the periaortic/ pericaval space of the retroperitoneum. Suspicious for lymphoma. Metastatic disease from one of the patient's primaries is felt much less likely. Consider nonemergent PET to direct tissue sampling. These results were called by telephone at the time of interpretation on 03/07/2013 at 11:02 AM to Dr. Katheren Shams , who verbally acknowledged these results. 3. Cholelithiasis. 4. Cirrhosis with treatment changes in the liver. No convincing evidence of recurrent or residual metastasis. 5. Right adrenal nodule which is indeterminate and new since 10/13/2005. This could also be evaluated at PET.   Electronically Signed   By: Abigail Miyamoto M.D.   On: 03/07/2013 11:03   Ct Foot Right Wo Contrast  03/04/2013   CLINICAL DATA:  Right foot pain and swelling for 7-10 days. History of Charcot change.  EXAM: CT OF THE RIGHT FOOT WITHOUT CONTRAST  TECHNIQUE: Multidetector CT imaging was performed according to the standard protocol. Multiplanar CT image reconstructions were also generated.  COMPARISON:  None.  FINDINGS: No fracture or dislocation is identified. There is extensive joint space narrowing with subchondral cyst formation and osteophytosis throughout the midfoot. Scattered small bony fragments are also identified and best seen about the 2nd, 3rd and 4th tarsometatarsal joints. No bony destructive change to suggest osteomyelitis is present. No foreign body or soft tissue gas collection is seen. No fluid collection is identified. Extensive atherosclerotic vascular disease is noted.  IMPRESSION: No acute or focal  abnormality.  Findings in the midfoot consistent with advanced osteoarthritis and mild to moderate neuropathic change are identified as described above.  Atherosclerosis.   Electronically Signed   By: Inge Rise M.D.   On: 03/04/2013 15:49   Ct Biopsy  03/19/2013   CLINICAL DATA:  79 year old male with a history of a both a pancreatic adenocarcinoma, and lymphoma now with an enlarging hypermetabolic (PET positive) para aortic retroperitoneal mass. CT-guided biopsy is requested to facilitate tissue diagnosis.  EXAM: CT GUIDED CORE BIOPSY OF PARA AORTIC RETROPERITONEAL MASS  ANESTHESIA/SEDATION: Two mg IV Versed; 100 mcg IV Fentanyl  Total Moderate Sedation Time: 2 minutes.  PROCEDURE: The procedure risks, benefits, and alternatives were explained to the patient. Questions regarding the procedure were encouraged and answered. The patient understands and consents to the procedure.  The right back was prepped with Betadinein a sterile fashion, and a sterile drape was applied covering the operative field. A sterile gown and sterile gloves were used for the procedure. Local anesthesia was provided with 1% Lidocaine.  A planning axial CT scan was performed. The periaortic mass was successfully identified. A suitable skin entry site was selected and marked. Local anesthesia was attained by infiltration with 1% lidocaine. A small dermatotomy was made. Using  intermittent CT guidance, a 15 cm 17 gauge trocar needle was carefully advanced through the right back, retroperitoneal space and into the margin of the periaortic mass. Multiple 18 gauge core biopsies were then coaxially obtained using the 18 gauge BioPince automated biopsy device.  Post biopsy axial CT imaging and demonstrates no evidence of immediate complication. Locules of gas are present within the biopsy site.  Complications: None immediate  FINDINGS: Successful identification of a periaortic retroperitoneal soft tissue mass.  IMPRESSION: Technically  successful CT-guided biopsy of periaortic retroperitoneal mass.  Signed,  Criselda Peaches, MD  [facility]  [facility]   Electronically Signed   By: Jacqulynn Cadet M.D.   On: 03/19/2013 15:18   Patient: MORLEY, GAUMER Collected: 03/19/2013 Client: [place] Accession: [id number] Received: 03/19/2013 Jacqulynn Cadet, MD DOB: 03/24/1932 Age: 80 Gender: M Reported: 03/22/2013 [address] Patient Ph: [phone] MRN #: [id number] [place], [place] [address] Visit #: [id number] Chart #: Phone:  Fax: CC: [name], MD REPORT OF SURGICAL PATHOLOGY FINAL DIAGNOSIS Diagnosis Retroperitoneal mass, biopsy - DIFFUSE LARGE B-CELL LYMPHOMA, CLINICALLY RECURRENT. - SEE ONCOLOGY TABLE. Microscopic Comment LYMPHOMA Histologic type: Non-Hodgkin lymphoma- diffuse large B-cell lymphoma Grade (if applicable): High grade. Flow cytometry: Not performed. Immunohistochemical stains: CD45, CD20, CD3, CD10, pancytokeratin. Touch preps/imprints: N/A. Comments: The biopsy consists predominately of necrotic tissue. There are a few foci of viable large highly atypical cells with irregular nuclear contours and prominent nucleoli. Immunohistochemistry reveals the cells are positive for CD45, CD20. CD3 highlights a few background T-completely T-cells. Pancytokeratin is negative. CD10 is non-contributory due to high levels of background staining. Overall, the morphologic and immunophenotypic findings are consistent with a diffuse large B-cell lymphoma. Given the patient's history these findings indicate recurrent disease. Vicente Males MD Pathologist, Electronic Signature (Case signed 03/22/2013)    ASSESSMENT/PLAN:  80 y.o. [place] man with a diagnosis of    #1 Primary hepatic non-Hodgkin's lymphoma established through liver biopsy February 2006, treated with Rituxan x9 and then CHOP x6.  All chemotherapy completed in 2007.   Maintenance Rituxan discontinued October 2008.     #2  Biopsy proven retroperitoneal recurrence documented November 2014. Bone marrow biopsy 04/01/2013 was negative   #3 Cycle #1  R-ICE chemotherapy, started on 04/29/2013, inpatient   #4 Neulasta given on 05/04/2013  #5 chemotherapy-induced pancytopenia, improved WBC and ANC. Continuing anemia with hemoglobin of 9.9 and thrombocytopenia with platelets of 63,000. The anemia is only slightly symptomatic. Thrombocytopenia is asymptomatic.   #6 Plan to initiate second cycle of R-ICE chemotherapy once the counts improve.  #7 Patient to followup with [facility] transplant team for possible autotransplant after second cycle of RICE chemotherapy with repeat scans   PLAN: This case was reviewed with Dr. Jana Hakim. Our plan was to go ahead and admit [name] today for day 1 cycle 2 of  RICE.  Unfortunately, during our appointment today, Varian's daughter got a phone call from her sister, [name]. His wife, [name], was apparently undergoing an endoscopy this afternoon. Apparently there was a complication, and there is the possibility of intubating her. I have spoken to his daughter [name] in the hallway, and she would like to hold off on [name] admission today so that they can get over to [facility] as soon as possible to see Bessie.  We will call [name] in the morning, and if her mother is doing well, we will consider admitting [name] tomorrow for his second cycle of RICE.  If not, we would like to admit him and initiate treatment as soon as possible. Dr. Jana Hakim does feel like there will be some margin gained by treating sooner rather than waiting for a complete 3 weeks.   Durene Fruits   05/13/2013 4:33 PM

## 2013-05-14 ENCOUNTER — Encounter (HOSPITAL_COMMUNITY): Payer: Self-pay | Admitting: *Deleted

## 2013-05-14 ENCOUNTER — Telehealth: Payer: Self-pay | Admitting: *Deleted

## 2013-05-14 ENCOUNTER — Other Ambulatory Visit: Payer: Self-pay | Admitting: Oncology

## 2013-05-14 ENCOUNTER — Inpatient Hospital Stay (HOSPITAL_COMMUNITY)
Admission: AD | Admit: 2013-05-14 | Discharge: 2013-05-17 | DRG: 847 | Disposition: A | Discharge status: Home / Self Care | Payer: Medicare Other | Source: Ambulatory Visit | Attending: Oncology | Admitting: Oncology

## 2013-05-14 DIAGNOSIS — D649 Anemia, unspecified: Secondary | ICD-10-CM | POA: Diagnosis not present

## 2013-05-14 DIAGNOSIS — E785 Hyperlipidemia, unspecified: Secondary | ICD-10-CM

## 2013-05-14 DIAGNOSIS — Z5111 Encounter for antineoplastic chemotherapy: Secondary | ICD-10-CM | POA: Diagnosis not present

## 2013-05-14 DIAGNOSIS — E119 Type 2 diabetes mellitus without complications: Secondary | ICD-10-CM | POA: Diagnosis present

## 2013-05-14 DIAGNOSIS — C8590 Non-Hodgkin lymphoma, unspecified, unspecified site: Secondary | ICD-10-CM

## 2013-05-14 DIAGNOSIS — I2510 Atherosclerotic heart disease of native coronary artery without angina pectoris: Secondary | ICD-10-CM | POA: Diagnosis present

## 2013-05-14 DIAGNOSIS — I10 Essential (primary) hypertension: Secondary | ICD-10-CM | POA: Diagnosis present

## 2013-05-14 DIAGNOSIS — L03119 Cellulitis of unspecified part of limb: Secondary | ICD-10-CM

## 2013-05-14 DIAGNOSIS — E876 Hypokalemia: Secondary | ICD-10-CM | POA: Diagnosis present

## 2013-05-14 DIAGNOSIS — K8010 Calculus of gallbladder with chronic cholecystitis without obstruction: Secondary | ICD-10-CM

## 2013-05-14 DIAGNOSIS — C8589 Other specified types of non-Hodgkin lymphoma, extranodal and solid organ sites: Secondary | ICD-10-CM | POA: Diagnosis present

## 2013-05-14 DIAGNOSIS — L02419 Cutaneous abscess of limb, unspecified: Secondary | ICD-10-CM

## 2013-05-14 DIAGNOSIS — Z8739 Personal history of other diseases of the musculoskeletal system and connective tissue: Secondary | ICD-10-CM

## 2013-05-14 DIAGNOSIS — K219 Gastro-esophageal reflux disease without esophagitis: Secondary | ICD-10-CM | POA: Diagnosis present

## 2013-05-14 DIAGNOSIS — C8580 Other specified types of non-Hodgkin lymphoma, unspecified site: Secondary | ICD-10-CM | POA: Diagnosis present

## 2013-05-14 DIAGNOSIS — Z87442 Personal history of urinary calculi: Secondary | ICD-10-CM

## 2013-05-14 DIAGNOSIS — Z87898 Personal history of other specified conditions: Secondary | ICD-10-CM

## 2013-05-14 DIAGNOSIS — Z8546 Personal history of malignant neoplasm of prostate: Secondary | ICD-10-CM

## 2013-05-14 DIAGNOSIS — T8140XA Infection following a procedure, unspecified, initial encounter: Secondary | ICD-10-CM

## 2013-05-14 DIAGNOSIS — R590 Localized enlarged lymph nodes: Secondary | ICD-10-CM

## 2013-05-14 DIAGNOSIS — R7881 Bacteremia: Secondary | ICD-10-CM

## 2013-05-14 DIAGNOSIS — K8021 Calculus of gallbladder without cholecystitis with obstruction: Secondary | ICD-10-CM

## 2013-05-14 LAB — CREATININE, SERUM
CREATININE: 0.87 mg/dL (ref 0.50–1.35)
GFR calc Af Amer: >90 mL/min (ref >90)
GFR, EST NON AFRICAN AMERICAN: 80 mL/min — AB (ref >90)

## 2013-05-14 LAB — CBC
HCT: 28.5 % — ABNORMAL LOW (ref 39.0–52.0)
Hemoglobin: 9.8 g/dL — ABNORMAL LOW (ref 13.0–17.0)
MCH: 28.1 pg (ref 26.0–34.0)
MCHC: 34.4 g/dL (ref 30.0–36.0)
MCV: 81.7 fL (ref 78.0–100.0)
PLATELETS: 71 10*3/uL — AB (ref 150–400)
RBC: 3.49 MIL/uL — ABNORMAL LOW (ref 4.22–5.81)
RDW: 14.8 % (ref 11.5–15.5)
WBC: 14.4 10*3/uL — ABNORMAL HIGH (ref 4.0–10.5)

## 2013-05-14 LAB — GLUCOSE, CAPILLARY: Glucose-Capillary: 255 mg/dL — ABNORMAL HIGH (ref 70–99)

## 2013-05-14 LAB — LACTATE DEHYDROGENASE: LDH: 288 U/L — ABNORMAL HIGH (ref 94–250)

## 2013-05-14 MED ORDER — VITAMIN D3 25 MCG (1000 UNIT) PO TABS
1000.0000 [IU] | ORAL_TABLET | Freq: Every day | ORAL | Status: DC
Start: 2013-05-15 — End: 2013-05-17
  Administered 2013-05-15 – 2013-05-17 (×3): 1000 [IU] via ORAL
  Filled 2013-05-14 (×3): qty 1

## 2013-05-14 MED ORDER — METFORMIN HCL 500 MG PO TABS
1000.0000 mg | ORAL_TABLET | Freq: Two times a day (BID) | ORAL | Status: DC
Start: 2013-05-14 — End: 2013-05-17
  Administered 2013-05-14 – 2013-05-17 (×7): 1000 mg via ORAL
  Filled 2013-05-14 (×8): qty 2

## 2013-05-14 MED ORDER — EZETIMIBE 10 MG PO TABS
10.0000 mg | ORAL_TABLET | Freq: Every day | ORAL | Status: DC
Start: 2013-05-14 — End: 2013-05-17
  Administered 2013-05-15 – 2013-05-17 (×3): 10 mg via ORAL
  Filled 2013-05-14 (×4): qty 1

## 2013-05-14 MED ORDER — PROCHLORPERAZINE MALEATE 10 MG PO TABS: 10.0000 mg | ORAL_TABLET | Freq: Four times a day (QID) | ORAL | Status: DC | PRN

## 2013-05-14 MED ORDER — INSULIN NPH (HUMAN) (ISOPHANE) 100 UNIT/ML ~~LOC~~ SUSP
34.0000 [IU] | Freq: Every day | SUBCUTANEOUS | Status: DC
  Administered 2013-05-14 – 2013-05-17 (×4): 34 [IU] via SUBCUTANEOUS

## 2013-05-14 MED ORDER — ATENOLOL 100 MG PO TABS
100.0000 mg | ORAL_TABLET | Freq: Every day | ORAL | Status: DC
  Administered 2013-05-15 – 2013-05-17 (×3): 100 mg via ORAL
  Filled 2013-05-14 (×4): qty 1

## 2013-05-14 MED ORDER — LORATADINE 10 MG PO TABS
10.0000 mg | ORAL_TABLET | Freq: Every day | ORAL | Status: DC
  Administered 2013-05-15 – 2013-05-17 (×3): 10 mg via ORAL
  Filled 2013-05-14 (×4): qty 1

## 2013-05-14 MED ORDER — SERTRALINE HCL 100 MG PO TABS
100.0000 mg | ORAL_TABLET | Freq: Every day | ORAL | Status: DC
Start: 2013-05-15 — End: 2013-05-17
  Administered 2013-05-15 – 2013-05-17 (×3): 100 mg via ORAL
  Filled 2013-05-14 (×3): qty 1

## 2013-05-14 MED ORDER — ENOXAPARIN SODIUM 40 MG/0.4ML ~~LOC~~ SOLN
40.0000 mg | SUBCUTANEOUS | Status: DC
  Administered 2013-05-14 – 2013-05-16 (×3): 40 mg via SUBCUTANEOUS
  Filled 2013-05-14 (×4): qty 0.4

## 2013-05-14 MED ORDER — GABAPENTIN 300 MG PO CAPS
300.0000 mg | ORAL_CAPSULE | Freq: Two times a day (BID) | ORAL | Status: DC
  Administered 2013-05-14 – 2013-05-17 (×6): 300 mg via ORAL
  Filled 2013-05-14 (×8): qty 1

## 2013-05-14 MED ORDER — LISINOPRIL 20 MG PO TABS
40.0000 mg | ORAL_TABLET | Freq: Every day | ORAL | Status: DC
  Administered 2013-05-15 – 2013-05-17 (×3): 40 mg via ORAL
  Filled 2013-05-14 (×3): qty 2

## 2013-05-14 MED ORDER — INSULIN ASPART 100 UNIT/ML ~~LOC~~ SOLN
0.0000 [IU] | Freq: Three times a day (TID) | SUBCUTANEOUS | Status: DC
  Administered 2013-05-15: 2 [IU] via SUBCUTANEOUS
  Administered 2013-05-15: 5 [IU] via SUBCUTANEOUS
  Administered 2013-05-15: 2 [IU] via SUBCUTANEOUS
  Administered 2013-05-16 (×3): 11 [IU] via SUBCUTANEOUS
  Administered 2013-05-17: 8 [IU] via SUBCUTANEOUS
  Administered 2013-05-17 (×2): 5 [IU] via SUBCUTANEOUS

## 2013-05-14 MED ORDER — INSULIN NPH (HUMAN) (ISOPHANE) 100 UNIT/ML ~~LOC~~ SUSP
36.0000 [IU] | Freq: Every day | SUBCUTANEOUS | Status: DC
  Administered 2013-05-15 – 2013-05-17 (×3): 36 [IU] via SUBCUTANEOUS
  Filled 2013-05-14: qty 10

## 2013-05-14 MED ORDER — ALLOPURINOL 300 MG PO TABS
300.0000 mg | ORAL_TABLET | Freq: Every day | ORAL | Status: DC
  Administered 2013-05-15 – 2013-05-17 (×3): 300 mg via ORAL
  Filled 2013-05-14 (×3): qty 1

## 2013-05-14 MED ORDER — LIDOCAINE-PRILOCAINE 2.5-2.5 % EX CREA
TOPICAL_CREAM | Freq: Once | CUTANEOUS | Status: AC
  Administered 2013-05-14: 19:00:00 via TOPICAL

## 2013-05-14 MED ORDER — ATORVASTATIN CALCIUM 20 MG PO TABS
20.0000 mg | ORAL_TABLET | Freq: Every day | ORAL | Status: DC
  Administered 2013-05-14 – 2013-05-17 (×4): 20 mg via ORAL
  Filled 2013-05-14 (×4): qty 1

## 2013-05-14 MED ORDER — HYDROCHLOROTHIAZIDE 25 MG PO TABS
25.0000 mg | ORAL_TABLET | Freq: Every day | ORAL | Status: DC
  Administered 2013-05-15 – 2013-05-17 (×3): 25 mg via ORAL
  Filled 2013-05-14 (×3): qty 1

## 2013-05-14 MED ORDER — DEXTROSE 5 % IV SOLN: INTRAVENOUS | Status: DC

## 2013-05-14 MED ORDER — ASPIRIN 81 MG PO CHEW
81.0000 mg | CHEWABLE_TABLET | Freq: Every day | ORAL | Status: DC
  Administered 2013-05-15 – 2013-05-17 (×3): 81 mg via ORAL
  Filled 2013-05-14 (×3): qty 1

## 2013-05-14 MED ORDER — ZOLPIDEM TARTRATE 5 MG PO TABS: 5.0000 mg | ORAL_TABLET | Freq: Every evening | ORAL | Status: DC | PRN

## 2013-05-14 MED ORDER — AMLODIPINE BESYLATE 5 MG PO TABS
5.0000 mg | ORAL_TABLET | Freq: Every day | ORAL | Status: DC
  Administered 2013-05-15 – 2013-05-17 (×3): 5 mg via ORAL
  Filled 2013-05-14 (×3): qty 1

## 2013-05-14 MED ORDER — VITAMIN C 500 MG PO TABS
500.0000 mg | ORAL_TABLET | Freq: Every day | ORAL | Status: DC
  Administered 2013-05-15 – 2013-05-17 (×3): 500 mg via ORAL
  Filled 2013-05-14 (×3): qty 1

## 2013-05-14 MED ORDER — FENOFIBRATE 160 MG PO TABS
160.0000 mg | ORAL_TABLET | Freq: Every day | ORAL | Status: DC
  Administered 2013-05-15 – 2013-05-17 (×3): 160 mg via ORAL
  Filled 2013-05-14 (×4): qty 1

## 2013-05-14 MED ORDER — SODIUM BICARBONATE 8.4 % IV SOLN
INTRAVENOUS | Status: DC
  Administered 2013-05-14 – 2013-05-17 (×6): via INTRAVENOUS
  Filled 2013-05-14 (×16): qty 100

## 2013-05-14 NOTE — Telephone Encounter (Signed)
[name] - pt's dtr called, stating post discussion with her dad- he would be comfortable being admitted this evening for chemotherapy.  Above discussed with MD- and above appropriate.  This RN requested a bed per bed control on 3rd floor for late afternoon admission with request to contact pt's dtr with given name and phone number.  This RN informed managed care.  Above called to [place].

## 2013-05-14 NOTE — H&P (Signed)
ID: [name] OB: [birth year] MR#: [id number] POE#[id number]  PCP: Horatio Pel, MD  GYN:  SU: [name]  OTHER MD: [name], [name], Donato Heinz  CHIEF COMPLAINT:  "I'm here for chemo"   HISTORY OF PRESENT ILLNESS:  From my of original intake note 06/28/2004:   "The patient developed right upper quadrant pain last year and he had an ultrasound April 5th which showed some gallstones without evidence of cholecystitis or ductal dilatation. However, there were multiple lesions in the liver which could not be assessed further. Accordingly on July 31, 2003, [name], showed multiple liver masses, more than 25, most over 1 cm, the largest being in the inferior right lobe, measuring 4.2 cm. There was also a small mass in the spleen. CT of the pelvis was unremarkable.  The patient had a biopsy of the liver August 01, 2003. The report says only that it was "a lesion in the right lobe of the liver". Presumably this was the largest lesion present. The final pathology [id number] and [phone]) showed only cirrhosis.  The patient has been followed by [name], and a repeat CT scan of the abdomen and pelvis was obtained May 18, 2004. Many of the liver lesions seen previously had actually decreased in size. However, the lesion in the posterior aspect of the lateral segment of the left liver had grown to 7.3 cm. Previously it had measured 2.6 cm. Although it says that no focal abnormalities are seen in the spleen, there is clearly a lesion in the spleen which is likely the one seen previously. CT of the pelvis was essentially negative.  With this information, a second ultrasound-guided biopsy was performed 06/11/04. This was a lesion deep in the left lobe of the liver and therefore, I would think not the same one previously biopsied which was in the right liver. The pathology this time [id number]) shows a poorly differentiated neuroendocrine  carcinoma which was positive for chromogranin A, negative for synaptophysin, thyroid transcription factor, a variety of cytokeratins, [name], alpha-fetoprotein and COX-2."   Mr. Virts was subsequently evaluated at [facility] by Dr. Elon Alas and repeat liver biopsy and review of the earlier biopsy here showed a primary hepatic lymphoma. The patient was treated with R-CHOP as detailed below and achieved a complete response. He received maintenance rituximab until 2008   More recently he presented with pain "like a kidney stone" and CTs were obtained11/13/2014, which showed no kidney stone but a soft tissue mass in the periaortic/pericaval space in the retroperitoneum. Biopsy of this mass 03/19/2013 confirmed recurrent diffuse large cell non-Hodgkin's lymphoma. Bone marrow biopsy 04/01/2013 showed a hypercellular bone marrow with no evidence of lymphoma. Patient alsounderwent a PET scan at [facility]. This showed activity in the retroperitoneal mass as well as in the right adrenal gland and in the lungs felt to be related to recurrent lymphoma.  The studies also showed cholelithiasis and with symptoms of cholecystitis the patient proceeded to laparoscopic cholecystectomy 04/04/2013 with benign pathology. A port was placed at the same time to facilitate chemotherapy access.  His subsequent history is detailed below.   INTERVAL HISTORY:  Mr Kabir is being admitted for his secondcycle of R-ICE chemotherapy. He ad been scheduled for admission yesterday but unfortunately his wife (who had undergone recent knee surgery and was receiving Rehab) appears to have had an MI and had to be admitted to the ICU at [facility], where she is  still intubated. As her condition has stabilized. Al is now ready to start his 2d cycle of RICE chemotherapy, with a view to autologous transplant at [facility] assuming he enters remission  REVIEW OF SYSTEMS:  He tolerated the first cycle remarkably well, and  despite a nadir neutrophil count of 0.1 he had no intercurrent fever or other complications. Currently he denies h/a, N/V/D, constipation, SOB, cough, phlegm production or pleurisy. There has been no bleeding or bruising, no mouth sores, and no change in bladder habits. He tells me his DM is "controlled OK" on his current regimen-- admittng he does not check his CBGs regularly at home  PAST MEDICAL HISTORY:  Past Medical History   Diagnosis  Date   .  Diabetes mellitus    .  Hypertension    .  Cancer of liver    .  Kidney stone    .  Prostate cancer    .  Skin cancer    .  Arthritis    .  Anemia    .  GERD (gastroesophageal reflux disease)    .  Lymphoma    Significant for prostate cancer, the patient undergoing prostatectomy January 2001 under [facility] for a Gleason 7, pathologic T3b (positive seminal vesicle involvement) adenocarcinoma with 0 of 2 lymph nodes involved. Other medical problems include sleep apnea, minor coronary artery disease, hypertension, hypertriglyceridemia, cholelithiasis, colon polyps, cirrhosis by biopsy, diabetes, [name], history of nephrolithiasis, status post right renal surgery and history of left rotator cuff repair under [name].   PAST SURGICAL HISTORY:  Past Surgical History   Procedure  Laterality  Date   .  Prostatectomy     .  Kidney stone surgery     .  Rotator cuff repair     .  Ankle surgery      FAMILY HISTORY  Family History   Problem  Relation  Age of Onset   .  Heart disease  Father    The patient's father died at the age of 59 from an MI. The patient's mother died from "old age" at 71. The patient is one of nine siblings. One brother died from cancer of the esophagus, one sister with lymphoma and a half-brother with lung cancer.   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 He is now retired. His wife, [name], a homemaker, is present today as are the patient's three daughters: [name] is an Scientist, physiological for [employer]; [name], [name]; and [name] is Environmental consultant. Everybody lives in [place]. The patient has five grandchildren. He is a member of [place] [place].   ADVANCED DIRECTIVES: in place   HEALTH MAINTENANCE:  History   Substance Use Topics   .  Smoking status:  Never Smoker   .  Smokeless tobacco:  Never Used   .  Alcohol Use:  No   Colonoscopy:  PSA: 8.01 (NOV 2014)  Bone density:  Lipid panel:    Allergies   Allergen  Reactions   .  Niacin  Other (See Comments)     headaches    Current Outpatient Prescriptions   Medication  Sig  Dispense  Refill   .  amLODipine (NORVASC) 5 MG tablet  Take 5 mg by mouth  daily.     .  aspirin 81 MG tablet  Take 81 mg by mouth daily.     Marland Kitchen  atenolol (TENORMIN) 100 MG tablet  Take 100 mg by mouth daily.     Marland Kitchen  atorvastatin (LIPITOR) 20 MG tablet  Take 20 mg by mouth daily.     .  cetirizine (ZYRTEC) 10 MG tablet  Take 10 mg by mouth daily as needed for allergies.     .  Cyanocobalamin (VITAMIN B 12 PO)  Take 2,500 mg by mouth daily.     Marland Kitchen  ezetimibe (ZETIA) 10 MG tablet  Take 10 mg by mouth daily.     .  fenofibrate 160 MG tablet  Take 160 mg by mouth daily.     Marland Kitchen  gabapentin (NEURONTIN) 300 MG capsule  Take 300 mg by mouth 2 (two) times daily.     .  hydrochlorothiazide (HYDRODIURIL) 25 MG tablet  Take 25 mg by mouth daily.     .  insulin NPH (HUMULIN N,NOVOLIN N) 100 UNIT/ML injection  Inject 34-36 Units into the skin. 36 units the morning and 34 after supper     .  lisinopril (PRINIVIL,ZESTRIL) 40 MG tablet  Take 40 mg by mouth daily.     .  metFORMIN (GLUCOPHAGE) 1000 MG tablet  Take 1,000 mg by mouth 2 (two) times daily with a meal.     .  Multiple Vitamins-Minerals (MULTIVITAMIN  WITH MINERALS) tablet  Take 1 tablet by mouth daily.     Marland Kitchen  oxyCODONE-acetaminophen (PERCOCET/ROXICET) 5-325 MG per tablet  Take 1-2 tablets by mouth every 4 (four) hours as needed for severe pain.     Marland Kitchen  sertraline (ZOLOFT) 100 MG tablet  Take 100 mg by mouth daily.       Scheduled Meds: . [START ON 05/15/2013] allopurinol  300 mg Oral Daily  . [START ON 05/15/2013] amLODipine  5 mg Oral Daily  . [START ON 05/15/2013] aspirin  81 mg Oral Daily  . atenolol  100 mg Oral Daily  . atorvastatin  20 mg Oral q1800  . [START ON 05/15/2013] cholecalciferol  1,000 Units Oral Daily  . enoxaparin (LOVENOX) injection  40 mg Subcutaneous Q24H  . ezetimibe  10 mg Oral Daily  . fenofibrate  160 mg Oral Daily  . gabapentin  300 mg Oral BID  . [START ON 05/15/2013] hydrochlorothiazide  25 mg Oral Daily  . [START ON 05/15/2013] insulin aspart  0-15 Units Subcutaneous TID WC  . insulin NPH Human  34 Units Subcutaneous QAC supper  . [START ON 05/15/2013] insulin NPH Human  36 Units Subcutaneous QAC breakfast  . [START ON 05/15/2013] lisinopril  40 mg Oral Daily  . loratadine  10 mg Oral Daily  . metFORMIN  1,000 mg Oral BID WC  . [START ON 05/15/2013] sertraline  100 mg Oral Daily  . [START ON 05/15/2013] vitamin C  500 mg Oral Daily   Continuous Infusions: . dextrose     PRN Meds:.prochlorperazine   OBJECTIVE: Middle aged White male examined in bed  Filed Vitals:   05/14/13 1830  BP: 148/62  Pulse: 65  Temp: 98.3 F (36.8 C)  Resp: 16    ECOG FS:0  Ocular: Sclerae unicteric, pupils equal and round Ear-nose-throat: Oropharynx clear, no thrush or other lesions  Lymphatic: No cervical or supraclavicular adenopathy; no axillary or inguinal adenopathy  Lungs no rales or rhonchi, examined anterolaterlly Heart regular rate and rhythm, no murmur appreciated  Abd  soft, nontender, positive bowel sounds, no masses palpated  MSK no focal spinal tenderness, no joint edema  Neuro: non-focal, well-oriented,  positive affect    LAB RESULTS:  Basic Metabolic Panel:  Recent Labs Lab 05/08/13 1051 05/13/13 1444 05/14/13 1853  NA 137 141  --   K 3.7 3.6  --   CO2 23 25  --   GLUCOSE 161* 150*  --   BUN 23.2 10.5  --   CREATININE 1.0 0.9 0.87  CALCIUM 9.2 8.9  --    GFR Estimated Creatinine Clearance: 74.9 ml/min (by C-G formula based on Cr of 0.87). Liver Function Tests:  Recent Labs Lab 05/08/13 1051 05/13/13 1444  AST 18 31  ALT 55 43  ALKPHOS 54 68  BILITOT 0.52 0.25  PROT 6.6 6.7  ALBUMIN 3.7 3.6   No results found for this basename: LIPASE, AMYLASE,  in the last 168 hours No results found for this basename: AMMONIA,  in the last 168 hours Coagulation profile No results found for this basename: INR, PROTIME,  in the last 168 hours  CBC:  Recent Labs Lab 05/08/13 1051 05/13/13 1444 05/14/13 1853  WBC 0.4* 13.6* 14.4*  NEUTROABS 0.1* 11.9*  --   HGB 10.2* 9.9* 9.8*  HCT 30.2* 29.4* 28.5*  MCV 82.1 81.6 81.7  PLT 78* 64* 71*   Cardiac Enzymes: No results found for this basename: CKTOTAL, CKMB, CKMBINDEX, TROPONINI,  in the last 168 hours BNP: No components found with this basename: POCBNP,  CBG: No results found for this basename: GLUCAP,  in the last 168 hours D-Dimer No results found for this basename: DDIMER,  in the last 72 hours Hgb A1c No results found for this basename: HGBA1C,  in the last 72 hours Lipid Profile No results found for this basename: CHOL, HDL, LDLCALC, TRIG, CHOLHDL, LDLDIRECT,  in the last 72 hours Thyroid function studies No results found for this basename: TSH, T4TOTAL, FREET3, T3FREE, THYROIDAB,  in the last 72 hours Anemia work up No results found for this basename: VITAMINB12, FOLATE, FERRITIN, TIBC, IRON, RETICCTPCT,  in the last 72 hours Microbiology No results found for this or any previous visit (from the past 240 hour(s)).    STUDIES:   No results found.  Transthoracic Echocardiography  Patient: Blanchard, Willhite MR #: [id number] Study Date: 03/29/2013 Gender: M Age: 43 Height: 172.7cm Weight: 89.5kg BSA: 2.59m2 Pt. Status: Room:  PERFORMING LFrench Hospital Medical CenterMagrinat, GHazle NordmannREFERRING PHoratio PelSONOGRAPHER GDiamond Nickelcc:  ------------------------------------------------------------ LV EF: 50% - 55%  ------------------------------------------------------------ Indications: V58.11 Chemotherapy Evaluation.  ------------------------------------------------------------ History: PMH: Lymphoma. Coronary artery disease. Risk factors: Diabetes mellitus. Dyslipidemia.  ------------------------------------------------------------ Study Conclusions  - Left ventricle: The cavity size was normal. Wall thickness was normal. Systolic function was normal. The estimated ejection fraction was in the range of 50% to 55%. Wall motion was normal; there were no regional wall motion abnormalities. Doppler parameters are consistent with abnormal left ventricular relaxation (grade 1 diastolic dysfunction). - Aortic valve: Moderately calcified annulus. Trileaflet; moderately thickened, moderately calcified leaflets. - Mitral valve: Calcified annulus. Mildly thickened leaflets . Mild regurgitation. - Left atrium: The atrium was moderately dilated. - Atrial septum: No defect or patent foramen ovale was identified. Transthoracic echocardiography. M-mode, complete 2D, spectral Doppler, and color Doppler. Height: Height: 172.7cm. Height: 68in. Weight: Weight: 89.5kg. Weight: 197lb. Body mass index: BMI: 30kg/m^2. Body surface area: BSA: 2.193m. Blood pressure: 129/62. Patient status: Outpatient. Location: Echo laboratory.  ------------------------------------------------------------  ------------------------------------------------------------ Left  ventricle: The cavity size was normal. Wall thickness was normal. Systolic function was normal. The estimated ejection  fraction was in the range of 50% to 55%. Wall motion was normal; there were no regional wall motion abnormalities. Doppler parameters are consistent with abnormal left ventricular relaxation (grade 1 diastolic dysfunction).   ASSESSMENT: 80 y.o. [place] man with a diagnosis of primary hepatic non-Hodgkin's lymphoma established through liver biopsy February 2006, treated with Rituxan x9 and then CHOP x6. All chemotherapy completed in 2007. Maintenance Rituxan discontinued October 2008.   (1) biopsy proven retroperitoneal recurrence documented November 2014; bone marrow biopsy 04/01/2013 negative  (2) on 04/29/2013 rceived the first of two planned cycles of R-ICE chemotherapy with a view to eventual autologous transplant at [facility]  (3) cycle 2 RICE to start 05/15/2012  PLAN:  Al did fine with his first cycle. His Hb and more importantly his platelets have not quite recovered, but we are going to proceed with cycle 2 tomorrow as planned and will support his Hb and platelet count with transfusion as needed. We will set him up for a repeat PET scan late next week to assess for response. Assuming scan is favorable we will refer back to [facility] for stem cell collection and ABMT. Al has a food understanding of the overall plan and agrees with it.  [name], MD 05/14/2013 8:35 PM

## 2013-05-14 NOTE — Telephone Encounter (Signed)
This RN called to pt's daughter [name] for inquiry with status of pt's wife regarding emergent care yesterday and need to admit pt for 2nd cycle of chemo.  Per Beryl Meager had to be intubated and received 6 units of blood - and bleeding did stop.  At present plan is to see if pt stablizes today and hopefully can be extubated.  Per discussion regarding need for Elizjah's medical care - request is for admission to be postponed until tomorrow which will allow better understanding of Bessie's situation.  Of note Bessie is currently and inpt at [facility] and Dandrea would need to be admitted to [facility] therefore making it difficult for Cam to be involved in decision making in wife's medical care.  This note will be given to MD and AB/PA.

## 2013-05-15 ENCOUNTER — Telehealth: Payer: Self-pay | Admitting: Oncology

## 2013-05-15 DIAGNOSIS — C8589 Other specified types of non-Hodgkin lymphoma, extranodal and solid organ sites: Secondary | ICD-10-CM | POA: Diagnosis not present

## 2013-05-15 LAB — COMPREHENSIVE METABOLIC PANEL
ALT: 28 U/L (ref 0–53)
AST: 21 U/L (ref 0–37)
Albumin: 3.3 g/dL — ABNORMAL LOW (ref 3.5–5.2)
Alkaline Phosphatase: 62 U/L (ref 39–117)
BUN: 10 mg/dL (ref 6–23)
CO2: 28 mEq/L (ref 19–32)
CREATININE: 0.75 mg/dL (ref 0.50–1.35)
Calcium: 8.2 mg/dL — ABNORMAL LOW (ref 8.4–10.5)
Chloride: 96 mEq/L (ref 96–112)
GFR calc non Af Amer: 85 mL/min — ABNORMAL LOW (ref >90)
GLUCOSE: 168 mg/dL — AB (ref 70–99)
Potassium: 3.4 mEq/L — ABNORMAL LOW (ref 3.7–5.3)
Sodium: 138 mEq/L (ref 137–147)
Total Bilirubin: <0.2 mg/dL — ABNORMAL LOW (ref 0.3–1.2)
Total Protein: 6.1 g/dL (ref 6.0–8.3)

## 2013-05-15 LAB — GLUCOSE, CAPILLARY
GLUCOSE-CAPILLARY: 139 mg/dL — AB (ref 70–99)
GLUCOSE-CAPILLARY: 132 mg/dL — AB (ref 70–99)
GLUCOSE-CAPILLARY: 215 mg/dL — AB (ref 70–99)
Glucose-Capillary: 307 mg/dL — ABNORMAL HIGH (ref 70–99)

## 2013-05-15 LAB — URINALYSIS, ROUTINE W REFLEX MICROSCOPIC
Bilirubin Urine: NEGATIVE
Glucose, UA: 250 mg/dL — AB
HGB URINE DIPSTICK: NEGATIVE
Ketones, ur: NEGATIVE mg/dL
Leukocytes, UA: NEGATIVE
Nitrite: NEGATIVE
PH: 6 (ref 5.0–8.0)
Protein, ur: NEGATIVE mg/dL
SPECIFIC GRAVITY, URINE: 1.01 (ref 1.005–1.030)
Urobilinogen, UA: 0.2 mg/dL (ref 0.0–1.0)

## 2013-05-15 LAB — CBC
HEMATOCRIT: 25.8 % — AB (ref 39.0–52.0)
Hemoglobin: 8.9 g/dL — ABNORMAL LOW (ref 13.0–17.0)
MCH: 28 pg (ref 26.0–34.0)
MCHC: 34.5 g/dL (ref 30.0–36.0)
MCV: 81.1 fL (ref 78.0–100.0)
Platelets: 62 10*3/uL — ABNORMAL LOW (ref 150–400)
RBC: 3.18 MIL/uL — AB (ref 4.22–5.81)
RDW: 14.5 % (ref 11.5–15.5)
WBC: 9.2 10*3/uL (ref 4.0–10.5)

## 2013-05-15 LAB — MAGNESIUM: MAGNESIUM: 1 mg/dL — AB (ref 1.5–2.5)

## 2013-05-15 LAB — PHOSPHORUS: Phosphorus: 3 mg/dL (ref 2.3–4.6)

## 2013-05-15 LAB — BETA 2 MICROGLOBULIN, SERUM: Beta-2 Microglobulin: 2.91 mg/L — ABNORMAL HIGH (ref 1.01–1.73)

## 2013-05-15 MED ORDER — SODIUM CHLORIDE 0.9 % IV SOLN: INTRAVENOUS | Status: DC

## 2013-05-15 MED ORDER — RITUXIMAB CHEMO INJECTION 10 MG/ML
375.0000 mg/m2 | Freq: Once | INTRAVENOUS | Status: AC
  Administered 2013-05-15: 800 mg via INTRAVENOUS
  Filled 2013-05-15: qty 80

## 2013-05-15 MED ORDER — ACETAMINOPHEN 325 MG PO TABS
650.0000 mg | ORAL_TABLET | Freq: Once | ORAL | Status: AC
  Administered 2013-05-15: 650 mg via ORAL
  Filled 2013-05-15: qty 2

## 2013-05-15 MED ORDER — ETOPOSIDE CHEMO INJECTION 1 GM/50ML
100.0000 mg/m2 | Freq: Once | INTRAVENOUS | Status: AC
  Administered 2013-05-15: 210 mg via INTRAVENOUS
  Filled 2013-05-15: qty 10.5

## 2013-05-15 MED ORDER — DIPHENHYDRAMINE HCL 25 MG PO CAPS
25.0000 mg | ORAL_CAPSULE | Freq: Once | ORAL | Status: AC
  Administered 2013-05-15: 25 mg via ORAL
  Filled 2013-05-15: qty 1

## 2013-05-15 MED ORDER — SODIUM CHLORIDE 0.9 % IV SOLN
Freq: Once | INTRAVENOUS | Status: AC
  Administered 2013-05-15: 16 mg via INTRAVENOUS
  Filled 2013-05-15: qty 8

## 2013-05-15 NOTE — Telephone Encounter (Signed)
lmonvm for pt re appts for 1/26, 1/28 and 1/30. schedule mailed. central will call pt w/pet appt

## 2013-05-15 NOTE — Progress Notes (Signed)
Patient tolerated Rituxan without any issues.  He currently has Etoposide infusing.  Zandra Abts [facility]  05/15/2013  3:57 PM

## 2013-05-15 NOTE — Care Management Note (Signed)
   CARE MANAGEMENT NOTE 05/15/2013  Patient:  TAJIDDIN, SCHIELKE   Account Number:  [id number]  Date Initiated:  05/15/2013  Documentation initiated by:  Joyceline Maiorino  Subjective/Objective Assessment:   80 yo male admitted for 2nd cycle of R-ICE. Hx of Non Hodgkins lymphoma.     Action/Plan:   Home when stable   Anticipated DC Date:     Anticipated DC Plan:  [place]  CM consult      Choice offered to / List presented to:  NA   DME arranged  NA      DME agency  NA     [place] arranged  NA      [place] agency  NA   Status of service:  Completed, signed off Medicare Important Message given?   (If response is "NO", the following Medicare IM given date fields will be blank) Date Medicare IM given:   Date Additional Medicare IM given:    Discharge Disposition:    Per UR Regulation:  Reviewed for med. necessity/level of care/duration of stay  If discussed at [place] of Stay Meetings, dates discussed:    Comments:  05/15/13 1229 Venita Lick Frenchie Dangerfield,MSN,RN [id number] Chart reviewed for utilization of services. No needs identified at this time.

## 2013-05-15 NOTE — Progress Notes (Signed)
[name]   DOB:Dec 21, 1931   PQ#:[id number]   ENM#:[id number]  Subjective: uneventful night, worried about his wife; repeat ROS this AM negative   Objective: middle aged White man examined at bedside Filed Vitals:   05/15/13 0516  BP: 144/77  Pulse: 71  Temp: 98.4 F (36.9 C)  Resp: 20    Body mass index is 28.05 kg/(m^2).  Intake/Output Summary (Last 24 hours) at 05/15/13 0740 Last data filed at 05/15/13 0300  Gross per 24 hour  Intake      0 ml  Output   1400 ml  Net  -1400 ml     Sclerae unicteric  Oropharynx no thrush  No peripheral adenopathy  Lungs clear -- no rales or rhonchi  Heart regular rate and rhythm  Abdomen soft, NT, +BS  MSK no focal spinal tenderness, no peripheral edema  Neuro nonfocal, well-oriented  .  CBG (last 3)   Recent Labs  05/14/13 2154 05/15/13 0716  GLUCAP 255* 139*     Labs:  Lab Results  Component Value Date   WBC 9.2 05/15/2013   HGB 8.9* 05/15/2013   HCT 25.8* 05/15/2013   MCV 81.1 05/15/2013   PLT 62* 05/15/2013   NEUTROABS 11.9* 05/13/2013    _0 @  Urine Studies No results found for this basename: UACOL, UAPR, USPG, UPH, UTP, UGL, UKET, UBIL, UHGB, UNIT, UROB, ULEU, UEPI, UWBC, URBC, UBAC, CAST, CRYS, UCOM, BILUA,  in the last 72 hours  Basic Metabolic Panel:  Recent Labs Lab 05/08/13 1051 05/13/13 1444 05/14/13 1853 05/15/13 0450  NA 137 141  --  138  K 3.7 3.6  --  3.4*  CL  --   --   --  96  CO2 23 25  --  28  GLUCOSE 161* 150*  --  168*  BUN 23.2 10.5  --  10  CREATININE 1.0 0.9 0.87 0.75  CALCIUM 9.2 8.9  --  8.2*  MG  --   --   --  1.0*   GFR Estimated Creatinine Clearance: 81.4 ml/min (by C-G formula based on Cr of 0.75). Liver Function Tests:  Recent Labs Lab 05/08/13 1051 05/13/13 1444 05/15/13 0450  AST _1 ALT 55 43 28  ALKPHOS 54 68 62  BILITOT 0.52 0.25 <0.2*  PROT 6.6 6.7 6.1  ALBUMIN 3.7 3.6 3.3*   No results found for this basename: LIPASE, AMYLASE,  in the  last 168 hours No results found for this basename: AMMONIA,  in the last 168 hours Coagulation profile No results found for this basename: INR, PROTIME,  in the last 168 hours  CBC:  Recent Labs Lab 05/08/13 1051 05/13/13 1444 05/14/13 1853 05/15/13 0450  WBC 0.4* 13.6* 14.4* 9.2  NEUTROABS 0.1* 11.9*  --   --   HGB 10.2* 9.9* 9.8* 8.9*  HCT 30.2* 29.4* 28.5* 25.8*  MCV 82.1 81.6 81.7 81.1  PLT 78* 64* 71* 62*   Cardiac Enzymes: No results found for this basename: CKTOTAL, CKMB, CKMBINDEX, TROPONINI,  in the last 168 hours BNP: No components found with this basename: POCBNP,  CBG:  Recent Labs Lab 05/14/13 2154 05/15/13 0716  GLUCAP 255* 139*   D-Dimer No results found for this basename: DDIMER,  in the last 72 hours Hgb A1c No results found for this basename: HGBA1C,  in the last 72 hours Lipid Profile No results found for this basename: CHOL, HDL, LDLCALC, TRIG, CHOLHDL, LDLDIRECT,  in the last 72 hours Thyroid function studies  No results found for this basename: TSH, T4TOTAL, FREET3, T3FREE, THYROIDAB,  in the last 72 hours Anemia work up No results found for this basename: VITAMINB12, FOLATE, FERRITIN, TIBC, IRON, RETICCTPCT,  in the last 72 hours Microbiology No results found for this or any previous visit (from the past 240 hour(s)).    Studies:  No results found.   Assessment: 80 y.o. [place] man with a diagnosis of primary hepatic non-Hodgkin's lymphoma established through liver biopsy February 2006, treated with Rituxan x9 and then CHOP x6. All chemotherapy completed in 2007. Maintenance Rituxan discontinued October 2008.  (1) biopsy proven retroperitoneal recurrence documented November 2014; bone marrow biopsy 04/01/2013 negative  (2) on 04/29/2013 rceived the first of two planned cycles of R-ICE chemotherapy with a view to eventual autologous transplant at [facility]  (3) cycle 2 RICE to start 05/15/2012   Plan:  His platelets are <100K but >50K-- we  are proceeding with chemo despite counts and will watch his platelet ct closely (labs Q48 hr after d/c) and transfuse as needed. He may also benefit from [facility] prior to discharge.  I have set him up for neulasta 01/26 and PET scan late next week. Anticipate visit at [facility] early Feb.-- will contact Dr Roger Kill, MD 05/15/2013  7:40 AM

## 2013-05-16 DIAGNOSIS — C8589 Other specified types of non-Hodgkin lymphoma, extranodal and solid organ sites: Secondary | ICD-10-CM | POA: Diagnosis not present

## 2013-05-16 LAB — COMPREHENSIVE METABOLIC PANEL
ALK PHOS: 69 U/L (ref 39–117)
ALT: 28 U/L (ref 0–53)
AST: 21 U/L (ref 0–37)
Albumin: 3.3 g/dL — ABNORMAL LOW (ref 3.5–5.2)
BILIRUBIN TOTAL: 0.2 mg/dL — AB (ref 0.3–1.2)
BUN: 15 mg/dL (ref 6–23)
CO2: 28 meq/L (ref 19–32)
Calcium: 7.6 mg/dL — ABNORMAL LOW (ref 8.4–10.5)
Chloride: 89 mEq/L — ABNORMAL LOW (ref 96–112)
Creatinine, Ser: 0.86 mg/dL (ref 0.50–1.35)
GFR, EST NON AFRICAN AMERICAN: 80 mL/min — AB (ref >90)
Glucose, Bld: 359 mg/dL — ABNORMAL HIGH (ref 70–99)
POTASSIUM: 3.8 meq/L (ref 3.7–5.3)
Sodium: 132 mEq/L — ABNORMAL LOW (ref 137–147)
TOTAL PROTEIN: 6.4 g/dL (ref 6.0–8.3)

## 2013-05-16 LAB — URINALYSIS, ROUTINE W REFLEX MICROSCOPIC
Bilirubin Urine: NEGATIVE
Glucose, UA: >1000 mg/dL — AB
Hgb urine dipstick: NEGATIVE
Ketones, ur: NEGATIVE mg/dL
LEUKOCYTES UA: NEGATIVE
Nitrite: NEGATIVE
PROTEIN: NEGATIVE mg/dL
Specific Gravity, Urine: 1.013 (ref 1.005–1.030)
UROBILINOGEN UA: 0.2 mg/dL (ref 0.0–1.0)
pH: 7 (ref 5.0–8.0)

## 2013-05-16 LAB — GLUCOSE, CAPILLARY
GLUCOSE-CAPILLARY: 305 mg/dL — AB (ref 70–99)
Glucose-Capillary: 304 mg/dL — ABNORMAL HIGH (ref 70–99)
Glucose-Capillary: 335 mg/dL — ABNORMAL HIGH (ref 70–99)
Glucose-Capillary: 357 mg/dL — ABNORMAL HIGH (ref 70–99)

## 2013-05-16 LAB — CBC
HEMATOCRIT: 27 % — AB (ref 39.0–52.0)
Hemoglobin: 9.3 g/dL — ABNORMAL LOW (ref 13.0–17.0)
MCH: 27.6 pg (ref 26.0–34.0)
MCHC: 34.4 g/dL (ref 30.0–36.0)
MCV: 80.1 fL (ref 78.0–100.0)
PLATELETS: 77 10*3/uL — AB (ref 150–400)
RBC: 3.37 MIL/uL — ABNORMAL LOW (ref 4.22–5.81)
RDW: 14.1 % (ref 11.5–15.5)
WBC: 17.1 10*3/uL — ABNORMAL HIGH (ref 4.0–10.5)

## 2013-05-16 LAB — URINE MICROSCOPIC-ADD ON: Urine-Other: NONE SEEN

## 2013-05-16 LAB — MAGNESIUM: MAGNESIUM: 0.8 mg/dL — AB (ref 1.5–2.5)

## 2013-05-16 MED ORDER — SODIUM CHLORIDE 0.9 % IV SOLN
Freq: Once | INTRAVENOUS | Status: AC
  Administered 2013-05-16: 15:00:00 via INTRAVENOUS
  Filled 2013-05-16: qty 205

## 2013-05-16 MED ORDER — PROCHLORPERAZINE MALEATE 10 MG PO TABS
10.0000 mg | ORAL_TABLET | Freq: Three times a day (TID) | ORAL | Status: DC
Start: 2013-05-16 — End: 2013-05-17
  Administered 2013-05-16 – 2013-05-17 (×6): 10 mg via ORAL
  Filled 2013-05-16 (×10): qty 1

## 2013-05-16 MED ORDER — MAGNESIUM SULFATE 40 MG/ML IJ SOLN
2.0000 g | Freq: Once | INTRAMUSCULAR | Status: AC
  Administered 2013-05-16: 2 g via INTRAVENOUS
  Filled 2013-05-16: qty 50

## 2013-05-16 MED ORDER — DEXAMETHASONE SODIUM PHOSPHATE 10 MG/ML IJ SOLN
INTRAMUSCULAR | Status: AC
Start: 2013-05-16 — End: 2013-05-17
  Administered 2013-05-16 – 2013-05-17 (×2): 16 mg via INTRAVENOUS
  Filled 2013-05-16 (×2): qty 8

## 2013-05-16 MED ORDER — DEXAMETHASONE SODIUM PHOSPHATE 4 MG/ML IJ SOLN: 8.0000 mg | Freq: Two times a day (BID) | INTRAMUSCULAR | Status: DC

## 2013-05-16 MED ORDER — SODIUM CHLORIDE 0.9 % IV SOLN
450.0000 mg | Freq: Once | INTRAVENOUS | Status: AC
  Administered 2013-05-16: 450 mg via INTRAVENOUS
  Filled 2013-05-16: qty 45

## 2013-05-16 MED ORDER — SODIUM CHLORIDE 0.9 % IV SOLN
100.0000 mg/m2 | INTRAVENOUS | Status: AC
  Administered 2013-05-16 – 2013-05-17 (×2): 210 mg via INTRAVENOUS
  Filled 2013-05-16 (×2): qty 10.5

## 2013-05-16 NOTE — Progress Notes (Signed)
PATIENT'S MAGNESIUM LEVEL THIS AM 0.8,  YESTERDAY 1.0, WILL ALERT DR. MAGRINAT  THIS AM TO RESULTS, Taelor Moncada, RN

## 2013-05-16 NOTE — Progress Notes (Signed)
[name]   DOB:[birth year]   KG#:[id number]   JSE#:[id number]  Subjective: tolerated rituximab and etoposide yesterday w/o difficulty; ambulating halls some; wife [name] still in ICU at [facility] but extubated; no family in room   Objective: middle aged White man examined at bedside Filed Vitals:   05/16/13 0510  BP: 142/63  Pulse: 69  Temp: 98.3 F (36.8 C)  Resp: 16    Body mass index is 28.05 kg/(m^2).  Intake/Output Summary (Last 24 hours) at 05/16/13 0827 Last data filed at 05/16/13 0803  Gross per 24 hour  Intake 3060.5 ml  Output   3325 ml  Net -264.5 ml     Sclerae unicteric  Oropharynx no thrush  No peripheral adenopathy  Lungs clear -- no rales or rhonchi  Heart regular rate and rhythm  Abdomen soft, NT, +BS  MSK no focal spinal tenderness, no peripheral edema  Neuro nonfocal, well-oriented  .  CBG (last 3)   Recent Labs  05/15/13 1713 05/15/13 2039 05/16/13 0704  GLUCAP 215* 307* 304*     Labs:  Lab Results  Component Value Date   WBC 17.1* 05/16/2013   HGB 9.3* 05/16/2013   HCT 27.0* 05/16/2013   MCV 80.1 05/16/2013   PLT 77* 05/16/2013   NEUTROABS 11.9* 05/13/2013    _0 @  Urine Studies No results found for this basename: UACOL, UAPR, USPG, UPH, UTP, UGL, UKET, UBIL, UHGB, UNIT, UROB, ULEU, UEPI, UWBC, URBC, UBAC, CAST, CRYS, UCOM, BILUA,  in the last 72 hours  Basic Metabolic Panel:  Recent Labs Lab 05/13/13 1444 05/14/13 1853 05/15/13 0450 05/16/13 0525  NA 141  --  138 132*  K 3.6  --  3.4* 3.8  CL  --   --  96 89*  CO2 25  --  28 28  GLUCOSE 150*  --  168* 359*  BUN 10.5  --  10 15  CREATININE 0.9 0.87 0.75 0.86  CALCIUM 8.9  --  8.2* 7.6*  MG  --   --  1.0* 0.8*  PHOS  --   --  3.0  --    GFR Estimated Creatinine Clearance: 75.8 ml/min (by C-G formula based on Cr of 0.86). Liver Function Tests:  Recent Labs Lab 05/13/13 1444 05/15/13 0450 05/16/13 0525  AST _1 ALT 43 28 28  ALKPHOS 68 62 69   BILITOT 0.25 <0.2* 0.2*  PROT 6.7 6.1 6.4  ALBUMIN 3.6 3.3* 3.3*   No results found for this basename: LIPASE, AMYLASE,  in the last 168 hours No results found for this basename: AMMONIA,  in the last 168 hours Coagulation profile No results found for this basename: INR, PROTIME,  in the last 168 hours  CBC:  Recent Labs Lab 05/13/13 1444 05/14/13 1853 05/15/13 0450 05/16/13 0525  WBC 13.6* 14.4* 9.2 17.1*  NEUTROABS 11.9*  --   --   --   HGB 9.9* 9.8* 8.9* 9.3*  HCT 29.4* 28.5* 25.8* 27.0*  MCV 81.6 81.7 81.1 80.1  PLT 64* 71* 62* 77*   Cardiac Enzymes: No results found for this basename: CKTOTAL, CKMB, CKMBINDEX, TROPONINI,  in the last 168 hours BNP: No components found with this basename: POCBNP,  CBG:  Recent Labs Lab 05/15/13 0716 05/15/13 1214 05/15/13 1713 05/15/13 2039 05/16/13 0704  GLUCAP 139* 132* 215* 307* 304*   D-Dimer No results found for this basename: DDIMER,  in the last 72 hours Hgb A1c No results found for this basename: HGBA1C,  in the last 72 hours Lipid Profile No results found for this basename: CHOL, HDL, LDLCALC, TRIG, CHOLHDL, LDLDIRECT,  in the last 72 hours Thyroid function studies No results found for this basename: TSH, T4TOTAL, FREET3, T3FREE, THYROIDAB,  in the last 72 hours Anemia work up No results found for this basename: VITAMINB12, FOLATE, FERRITIN, TIBC, IRON, RETICCTPCT,  in the last 72 hours Microbiology No results found for this or any previous visit (from the past 240 hour(s)).    Studies:  No results found.   Assessment: 80 y.o. [place] man with a diagnosis of primary hepatic non-Hodgkin's lymphoma established through liver biopsy February 2006, treated with Rituxan x9 and then CHOP x6. All chemotherapy completed in 2007. Maintenance Rituxan discontinued October 2008.  (1) biopsy proven retroperitoneal recurrence documented November 2014; bone marrow biopsy 04/01/2013 negative  (2) on 04/29/2013 rceived the  first of two planned cycles of R-ICE chemotherapy with a view to eventual autologous transplant at [facility]  (3) cycle 2 RICE to start 05/15/2012   Plan:  Day 2 cycle 2 RICE. Will supplement magnesium, make compazine ACHS, start decadron this evening-- he did well with this regimen last time. Possibly he could go home tomorrow evening--he is eager to visit his wife.  I have set him up for neulasta 01/26 and PET scan late next week. Anticipate visit at [facility] early Feb.-- will contact Dr Roger Kill, MD 05/16/2013  8:27 AM

## 2013-05-16 NOTE — Progress Notes (Signed)
Inpatient Diabetes Program Recommendations  AACE/ADA: New Consensus Statement on Inpatient Glycemic Control (2013)  Target Ranges:  Prepandial:   less than 140 mg/dL      Peak postprandial:   less than 180 mg/dL (1-2 hours)      Critically ill patients:  140 - 180 mg/dL   Reason for Visit: Results for KAYVEON, GUIANG (MRN [id number]) as of 05/16/2013 11:22  Ref. Range 05/15/2013 17:13 05/15/2013 20:39 05/16/2013 07:04 05/16/2013 11:06  Glucose-Capillary Latest Range: 70-99 mg/dL 215 (H) 307 (H) 304 (H) 305 (H)   May consider 20% increase in NPH doses while on Decadron.  Consider NPH 44 units q AM and NPH 40 units q PM.  May also consider q 4 hour moderate correction while in the hospital.   Note: As steroids are tapered or stopped, will need to decrease NPH back to previous home dose of NPH 36 units with breakfast and NPH 34 units with supper.    Adah Perl, RN, BC-ADM Inpatient Diabetes Coordinator Pager [phone]

## 2013-05-17 ENCOUNTER — Other Ambulatory Visit: Payer: Self-pay | Admitting: Oncology

## 2013-05-17 ENCOUNTER — Other Ambulatory Visit: Payer: Self-pay | Admitting: *Deleted

## 2013-05-17 DIAGNOSIS — C8589 Other specified types of non-Hodgkin lymphoma, extranodal and solid organ sites: Secondary | ICD-10-CM | POA: Diagnosis not present

## 2013-05-17 DIAGNOSIS — E876 Hypokalemia: Secondary | ICD-10-CM

## 2013-05-17 DIAGNOSIS — D649 Anemia, unspecified: Secondary | ICD-10-CM | POA: Diagnosis not present

## 2013-05-17 LAB — URINALYSIS, ROUTINE W REFLEX MICROSCOPIC
Bilirubin Urine: NEGATIVE
GLUCOSE, UA: 500 mg/dL — AB
Hgb urine dipstick: NEGATIVE
Ketones, ur: >80 mg/dL — AB
LEUKOCYTES UA: NEGATIVE
Nitrite: NEGATIVE
Protein, ur: NEGATIVE mg/dL
SPECIFIC GRAVITY, URINE: 1.012 (ref 1.005–1.030)
Urobilinogen, UA: 0.2 mg/dL (ref 0.0–1.0)
pH: 7 (ref 5.0–8.0)

## 2013-05-17 LAB — COMPREHENSIVE METABOLIC PANEL
ALBUMIN: 3 g/dL — AB (ref 3.5–5.2)
ALK PHOS: 65 U/L (ref 39–117)
ALT: 28 U/L (ref 0–53)
AST: 22 U/L (ref 0–37)
BUN: 20 mg/dL (ref 6–23)
CO2: 30 mEq/L (ref 19–32)
Calcium: 7.3 mg/dL — ABNORMAL LOW (ref 8.4–10.5)
Chloride: 92 mEq/L — ABNORMAL LOW (ref 96–112)
Creatinine, Ser: 0.87 mg/dL (ref 0.50–1.35)
GFR calc Af Amer: >90 mL/min (ref >90)
GFR calc non Af Amer: 80 mL/min — ABNORMAL LOW (ref >90)
Glucose, Bld: 284 mg/dL — ABNORMAL HIGH (ref 70–99)
POTASSIUM: 3.8 meq/L (ref 3.7–5.3)
Sodium: 136 mEq/L — ABNORMAL LOW (ref 137–147)
TOTAL PROTEIN: 5.9 g/dL — AB (ref 6.0–8.3)

## 2013-05-17 LAB — CBC
HCT: 25.1 % — ABNORMAL LOW (ref 39.0–52.0)
HEMOGLOBIN: 8.8 g/dL — AB (ref 13.0–17.0)
MCH: 28.1 pg (ref 26.0–34.0)
MCHC: 35.1 g/dL (ref 30.0–36.0)
MCV: 80.2 fL (ref 78.0–100.0)
Platelets: 85 10*3/uL — ABNORMAL LOW (ref 150–400)
RBC: 3.13 MIL/uL — ABNORMAL LOW (ref 4.22–5.81)
RDW: 14.2 % (ref 11.5–15.5)
WBC: 14.2 10*3/uL — AB (ref 4.0–10.5)

## 2013-05-17 LAB — GLUCOSE, CAPILLARY
GLUCOSE-CAPILLARY: 261 mg/dL — AB (ref 70–99)
Glucose-Capillary: 219 mg/dL — ABNORMAL HIGH (ref 70–99)
Glucose-Capillary: 209 mg/dL — ABNORMAL HIGH (ref 70–99)

## 2013-05-17 LAB — MAGNESIUM: MAGNESIUM: 1.3 mg/dL — AB (ref 1.5–2.5)

## 2013-05-17 MED ORDER — PROCHLORPERAZINE MALEATE 10 MG PO TABS: ORAL_TABLET | ORAL | Status: DC

## 2013-05-17 MED ORDER — MAGNESIUM SULFATE 40 MG/ML IJ SOLN
2.0000 g | Freq: Once | INTRAMUSCULAR | Status: AC
  Administered 2013-05-17: 2 g via INTRAVENOUS
  Filled 2013-05-17: qty 50

## 2013-05-17 MED ORDER — HEPARIN SOD (PORK) LOCK FLUSH 100 UNIT/ML IV SOLN
500.0000 [IU] | INTRAVENOUS | Status: AC | PRN
  Administered 2013-05-17: 500 [IU]
  Filled 2013-05-17: qty 5

## 2013-05-17 MED ORDER — DEXAMETHASONE 4 MG PO TABS: 8.0000 mg | ORAL_TABLET | Freq: Two times a day (BID) | ORAL | Status: DC

## 2013-05-17 NOTE — Progress Notes (Signed)
Pt's urine glucose level was 500 this AM and ketone level was > 80.

## 2013-05-17 NOTE — Progress Notes (Signed)
Patient d/c home. Stable.- Leighanna Kirn RN 

## 2013-05-17 NOTE — Discharge Summary (Signed)
Physician Discharge Summary  Patient ID: [name] MRN: [id number] DOB/AGE: 1932/01/30 80 y.o.  Admit date: 05/14/2013 Discharge date: 05/17/2013  Primary Care Physician:  Horatio Pel, MD   Discharge Diagnoses:    Present on Admission:  . Lymphoma malignant, large cell Diabetes Anemia Hypomagnesemia Hypokalemia  Discharge Medications:    Medication List         allopurinol 300 MG tablet  Commonly known as:  ZYLOPRIM  Take 1 tablet (300 mg total) by mouth daily.     amLODipine 5 MG tablet  Commonly known as:  NORVASC  Take 5 mg by mouth daily.     aspirin 81 MG tablet  Take 81 mg by mouth daily.     atenolol 100 MG tablet  Commonly known as:  TENORMIN  Take 100 mg by mouth daily.     atorvastatin 20 MG tablet  Commonly known as:  LIPITOR  Take 20 mg by mouth daily.     cetirizine 10 MG tablet  Commonly known as:  ZYRTEC  Take 10 mg by mouth daily as needed for allergies.     cholecalciferol 1000 UNITS tablet  Commonly known as:  VITAMIN D  Take 1,000 Units by mouth daily.     dexamethasone 4 MG tablet  Commonly known as:  DECADRON  Take 2 tablets (8 mg total) by mouth 2 (two) times daily with a meal.     diazepam 5 MG tablet  Commonly known as:  VALIUM  Take 2.5-5 mg by mouth at bedtime as needed for anxiety or muscle spasms.     ezetimibe 10 MG tablet  Commonly known as:  ZETIA  Take 10 mg by mouth daily.     fenofibrate 160 MG tablet  Take 160 mg by mouth daily.     gabapentin 300 MG capsule  Commonly known as:  NEURONTIN  Take 300 mg by mouth 2 (two) times daily.     hydrochlorothiazide 25 MG tablet  Commonly known as:  HYDRODIURIL  Take 25 mg by mouth daily.     HYDROcodone-acetaminophen 5-325 MG per tablet  Commonly known as:  NORCO/VICODIN  Take 1-2 tablets by mouth every 4 (four) hours as needed for moderate pain (Every 4 to 6 hours as needed for pain.).     insulin NPH Human 100 UNIT/ML injection   Commonly known as:  HUMULIN N,NOVOLIN N  Inject 34-36 Units into the skin. 36 units the morning and 34 after supper     lisinopril 40 MG tablet  Commonly known as:  PRINIVIL,ZESTRIL  Take 40 mg by mouth daily.     metFORMIN 1000 MG tablet  Commonly known as:  GLUCOPHAGE  Take 1,000 mg by mouth 2 (two) times daily with a meal.     multivitamin with minerals tablet  Take 1 tablet by mouth daily.     oxyCODONE-acetaminophen 5-325 MG per tablet  Commonly known as:  PERCOCET/ROXICET  Take 1-2 tablets by mouth every 4 (four) hours as needed for severe pain.     prochlorperazine 10 MG tablet  Commonly known as:  COMPAZINE  Take this medication for nausea before meals and at bedtime until JAN 24 noon, then as needed     sertraline 100 MG tablet  Commonly known as:  ZOLOFT  Take 100 mg by mouth daily.     VITAMIN B 12 PO  Take 2,500 mg by mouth daily.     vitamin C 500 MG tablet  Commonly known as:  ASCORBIC ACID  Take 500 mg by mouth daily.         Disposition and Follow-up: to [place] [facility] 1/26 for labs and NEULASTA; return 1/28 for labs and visit; PET scan as scheduled  Significant Diagnostic Studies:  No results found.  Discharge Laboratory Values: Basic Metabolic Panel:  Recent Labs Lab 05/13/13 1444 05/14/13 1853  05/15/13 0450 05/16/13 0525 05/17/13 0420  NA 141  --   --  138 132* 136*  K 3.6  --   < > 3.4* 3.8 3.8  CL  --   --   --  96 89* 92*  CO2 25  --   --  _0 GLUCOSE 150*  --   --  168* 359* 284*  BUN 10.5  --   --  _1 CREATININE 0.9 0.87  --  0.75 0.86 0.87  CALCIUM 8.9  --   --  8.2* 7.6* 7.3*  MG  --   --   --  1.0* 0.8* 1.3*  PHOS  --   --   --  3.0  --   --   < > = values in this interval not displayed. GFR Estimated Creatinine Clearance: 74.9 ml/min (by C-G formula based on Cr of 0.87). Liver Function Tests:  Recent Labs Lab 05/13/13 1444 05/15/13 0450 05/16/13 0525 05/17/13 0420  AST _2 ALT 43 _3 ALKPHOS 68 62 69 65  BILITOT 0.25 <0.2* 0.2* <0.2*  PROT 6.7 6.1 6.4 5.9*  ALBUMIN 3.6 3.3* 3.3* 3.0*   No results found for this basename: LIPASE, AMYLASE,  in the last 168 hours No results found for this basename: AMMONIA,  in the last 168 hours Coagulation profile No results found for this basename: INR, PROTIME,  in the last 168 hours  CBC:  Recent Labs Lab 05/13/13 1444 05/14/13 1853 05/15/13 0450 05/16/13 0525 05/17/13 0420  WBC 13.6* 14.4* 9.2 17.1* 14.2*  NEUTROABS 11.9*  --   --   --   --   HGB 9.9* 9.8* 8.9* 9.3* 8.8*  HCT 29.4* 28.5* 25.8* 27.0* 25.1*  MCV 81.6 81.7 81.1 80.1 80.2  PLT 64* 71* 62* 77* 85*   Cardiac Enzymes: No results found for this basename: CKTOTAL, CKMB, CKMBINDEX, TROPONINI,  in the last 168 hours BNP: No components found with this basename: POCBNP,  CBG:  Recent Labs Lab 05/16/13 0704 05/16/13 1106 05/16/13 1738 05/16/13 2209 05/17/13 0713  GLUCAP 304* 305* 335* 357* 219*   D-Dimer No results found for this basename: DDIMER,  in the last 72 hours Hgb A1c No results found for this basename: HGBA1C,  in the last 72 hours Lipid Profile No results found for this basename: CHOL, HDL, LDLCALC, TRIG, CHOLHDL, LDLDIRECT,  in the last 72 hours Thyroid function studies No results found for this basename: TSH, T4TOTAL, FREET3, T3FREE, THYROIDAB,  in the last 72 hours Anemia work up No results found for this basename: VITAMINB12, FOLATE, FERRITIN, TIBC, IRON, RETICCTPCT,  in the last 72 hours Microbiology No results found for this or any previous visit (from the past 240 hour(s)).   Brief H and P: For complete details please refer to admission H and P, but in brief, the patient was admitted for his 2d cycle of RICE chemotherapy. For details see Hospital Course  Physical Exam at Discharge: BP 133/64  Pulse 72  Temp(Src) 97.7 F (36.5 C) (Oral)  Resp 18  Ht _4  (  1.753 m)  Wt 190 lb (86.183 kg)  BMI 28.05 kg/m2  SpO2 98% Gen:  middle aged White man examined in recliner Cardiovascular: RRR, no murmur appreciated Respiratory: no rales or rhonchi Gastrointestinal: soft, NT , +BS Extremities: no edema    Hospital Course:  Active Problems:   Lymphoma malignant, large cell  80 y.o. [name] man with a diagnosis of primary hepatic non-Hodgkin's lymphoma established through liver biopsy February 2006, treated with Rituxan x9 and then CHOP x6. All chemotherapy completed in 2007. Maintenance Rituxan discontinued October 2008.  (1) biopsy proven retroperitoneal recurrence documented November 2014; bone marrow biopsy 04/01/2013 negative  (2) on 04/29/2013 rceived the first of two planned cycles of R-ICE chemotherapy with a view to eventual autologous transplant at [facility]  (3) cycle 2 RICE given 05/15/2012 - 05/17/2012  Mr Matusek was admitted a day late because his wife took a turn for the worse after knee re[placement and ended up intubated in the ICU at [facility]. (She is now extubated and on a regular floor). On 01/21 AM the patient receied etoposide at 100 mg/ M2 (total dose 210 mg) and this was repeated daily x3. On the same day he received  rituximab at 375 (total dose 800 mg), withoutt event. On 01/22 after etoposide the patient started ifosfamide at 5 g/M2 (total dose 10,250 mg) with an equivalent amount of [place], over 24 h, followed on 01/23 by the final etoposide dose.  Mr Sowash tolerated the chemotherapy w/o untoward event-- port worked well, patient reported no nausea or vomiting, he remained ambulatory, there was no constipation or diarrhea, no rash, and no MS changes. His initially low magnesium and potassium were corrected. His low platelet count was followed closely and was actually improved at the time of discharge. Hb remained stable. CBGs ranged from 219 to 357 on his home regimen.  The patient is being discharged 01/23 at the completion of his chemotherapy. His counts will be followed closely until recovery and  he is scheduled for a restaging PET next week to be followed by assessment at [facility]. Mr Nordin knows to call for any problems that may develop before his next scheduled visit  Diet:  Regular; avoid concentrated sweets  Activity:  No rstrictions  Condition at Discharge:   stable  Signed: Dr. Lurline Del [phone]  05/17/2013, 8:28 AM

## 2013-05-18 ENCOUNTER — Ambulatory Visit (HOSPITAL_BASED_OUTPATIENT_CLINIC_OR_DEPARTMENT_OTHER): Payer: Medicare Other

## 2013-05-18 VITALS — BP 134/63 | HR 70 | Temp 97.8°F | Resp 18

## 2013-05-18 DIAGNOSIS — C8589 Other specified types of non-Hodgkin lymphoma, extranodal and solid organ sites: Secondary | ICD-10-CM | POA: Diagnosis not present

## 2013-05-18 DIAGNOSIS — C8590 Non-Hodgkin lymphoma, unspecified, unspecified site: Secondary | ICD-10-CM

## 2013-05-18 MED ORDER — PEGFILGRASTIM INJECTION 6 MG/0.6ML
6.0000 mg | Freq: Once | SUBCUTANEOUS | Status: AC
  Administered 2013-05-18: 6 mg via SUBCUTANEOUS

## 2013-05-20 ENCOUNTER — Encounter (INDEPENDENT_AMBULATORY_CARE_PROVIDER_SITE_OTHER): Payer: Self-pay

## 2013-05-20 ENCOUNTER — Other Ambulatory Visit: Payer: Self-pay | Admitting: Oncology

## 2013-05-20 ENCOUNTER — Ambulatory Visit: Payer: Medicare Other

## 2013-05-20 ENCOUNTER — Other Ambulatory Visit (HOSPITAL_BASED_OUTPATIENT_CLINIC_OR_DEPARTMENT_OTHER): Payer: Medicare Other

## 2013-05-20 DIAGNOSIS — C8590 Non-Hodgkin lymphoma, unspecified, unspecified site: Secondary | ICD-10-CM

## 2013-05-20 DIAGNOSIS — C8589 Other specified types of non-Hodgkin lymphoma, extranodal and solid organ sites: Secondary | ICD-10-CM

## 2013-05-20 LAB — CBC WITH DIFFERENTIAL/PLATELET
BASO%: 1.2 % (ref 0.0–2.0)
Basophils Absolute: 0.3 10*3/uL — ABNORMAL HIGH (ref 0.0–0.1)
EOS%: 0.1 % (ref 0.0–7.0)
Eosinophils Absolute: 0 10*3/uL (ref 0.0–0.5)
HCT: 28.7 % — ABNORMAL LOW (ref 38.4–49.9)
HGB: 9.7 g/dL — ABNORMAL LOW (ref 13.0–17.1)
LYMPH%: 0.8 % — AB (ref 14.0–49.0)
MCH: 27.6 pg (ref 27.2–33.4)
MCHC: 33.9 g/dL (ref 32.0–36.0)
MCV: 81.6 fL (ref 79.3–98.0)
MONO#: 0.1 10*3/uL (ref 0.1–0.9)
MONO%: 0.5 % (ref 0.0–14.0)
NEUT#: 25.2 10*3/uL — ABNORMAL HIGH (ref 1.5–6.5)
NEUT%: 97.4 % — ABNORMAL HIGH (ref 39.0–75.0)
PLATELETS: 215 10*3/uL (ref 140–400)
RBC: 3.51 10*6/uL — AB (ref 4.20–5.82)
RDW: 15.3 % — ABNORMAL HIGH (ref 11.0–14.6)
WBC: 25.9 10*3/uL — ABNORMAL HIGH (ref 4.0–10.3)
lymph#: 0.2 10*3/uL — ABNORMAL LOW (ref 0.9–3.3)

## 2013-05-20 LAB — COMPREHENSIVE METABOLIC PANEL (CC13)
ALK PHOS: 134 U/L (ref 40–150)
ALT: 81 U/L — AB (ref 0–55)
ANION GAP: 12 meq/L — AB (ref 3–11)
AST: 34 U/L (ref 5–34)
Albumin: 3.6 g/dL (ref 3.5–5.0)
BILIRUBIN TOTAL: 0.45 mg/dL (ref 0.20–1.20)
BUN: 39.9 mg/dL — ABNORMAL HIGH (ref 7.0–26.0)
CO2: 18 meq/L — AB (ref 22–29)
Calcium: 8.8 mg/dL (ref 8.4–10.4)
Chloride: 104 mEq/L (ref 98–109)
Creatinine: 1.8 mg/dL — ABNORMAL HIGH (ref 0.7–1.3)
Glucose: 222 mg/dl — ABNORMAL HIGH (ref 70–140)
Potassium: 4.1 mEq/L (ref 3.5–5.1)
SODIUM: 133 meq/L — AB (ref 136–145)
TOTAL PROTEIN: 6.3 g/dL — AB (ref 6.4–8.3)

## 2013-05-21 ENCOUNTER — Telehealth: Payer: Self-pay | Admitting: *Deleted

## 2013-05-21 ENCOUNTER — Other Ambulatory Visit: Payer: Self-pay | Admitting: *Deleted

## 2013-05-21 ENCOUNTER — Ambulatory Visit (HOSPITAL_BASED_OUTPATIENT_CLINIC_OR_DEPARTMENT_OTHER): Payer: Medicare Other

## 2013-05-21 ENCOUNTER — Telehealth: Payer: Self-pay | Admitting: Oncology

## 2013-05-21 VITALS — BP 101/60 | HR 73 | Temp 97.7°F | Resp 17

## 2013-05-21 DIAGNOSIS — C8590 Non-Hodgkin lymphoma, unspecified, unspecified site: Secondary | ICD-10-CM

## 2013-05-21 DIAGNOSIS — C8589 Other specified types of non-Hodgkin lymphoma, extranodal and solid organ sites: Secondary | ICD-10-CM | POA: Diagnosis not present

## 2013-05-21 DIAGNOSIS — D696 Thrombocytopenia, unspecified: Secondary | ICD-10-CM

## 2013-05-21 DIAGNOSIS — Z95828 Presence of other vascular implants and grafts: Secondary | ICD-10-CM

## 2013-05-21 DIAGNOSIS — E119 Type 2 diabetes mellitus without complications: Secondary | ICD-10-CM

## 2013-05-21 MED ORDER — SODIUM CHLORIDE 0.9 % IJ SOLN
10.0000 mL | INTRAMUSCULAR | Status: DC | PRN
  Administered 2013-05-21: 10 mL via INTRAVENOUS
  Filled 2013-05-21: qty 10

## 2013-05-21 MED ORDER — HEPARIN SOD (PORK) LOCK FLUSH 100 UNIT/ML IV SOLN
500.0000 [IU] | Freq: Once | INTRAVENOUS | Status: AC
  Administered 2013-05-21: 500 [IU] via INTRAVENOUS
  Filled 2013-05-21: qty 5

## 2013-05-21 MED ORDER — SODIUM CHLORIDE 0.9 % IV SOLN
Freq: Once | INTRAVENOUS | Status: DC
  Administered 2013-05-21: 15:00:00 via INTRAVENOUS

## 2013-05-21 NOTE — Patient Instructions (Signed)
Dehydration, Adult Dehydration is when you lose more fluids from the body than you take in. Vital organs like the kidneys, brain, and heart cannot function without a proper amount of fluids and salt. Any loss of fluids from the body can cause dehydration.  CAUSES   Vomiting.  Diarrhea.  Excessive sweating.  Excessive urine output.  Fever. SYMPTOMS  Mild dehydration  Thirst.  Dry lips.  Slightly dry mouth. Moderate dehydration  Very dry mouth.  Sunken eyes.  Skin does not bounce back quickly when lightly pinched and released.  Dark urine and decreased urine production.  Decreased tear production.  Headache. Severe dehydration  Very dry mouth.  Extreme thirst.  Rapid, weak pulse (more than 100 beats per minute at rest).  Cold hands and feet.  Not able to sweat in spite of heat and temperature.  Rapid breathing.  Blue lips.  Confusion and lethargy.  Difficulty being awakened.  Minimal urine production.  No tears. DIAGNOSIS  Your caregiver will diagnose dehydration based on your symptoms and your exam. Blood and urine tests will help confirm the diagnosis. The diagnostic evaluation should also identify the cause of dehydration. TREATMENT  Treatment of mild or moderate dehydration can often be done at home by increasing the amount of fluids that you drink. It is best to drink small amounts of fluid more often. Drinking too much at one time can make vomiting worse. Refer to the home care instructions below. Severe dehydration needs to be treated at the hospital where you will probably be given intravenous (IV) fluids that contain water and electrolytes. HOME CARE INSTRUCTIONS   Ask your caregiver about specific rehydration instructions.  Drink enough fluids to keep your urine clear or pale yellow.  Drink small amounts frequently if you have nausea and vomiting.  Eat as you normally do.  Avoid:  Foods or drinks high in sugar.  Carbonated  drinks.  Juice.  Extremely hot or cold fluids.  Drinks with caffeine.  Fatty, greasy foods.  Alcohol.  Tobacco.  Overeating.  Gelatin desserts.  Wash your hands well to avoid spreading bacteria and viruses.  Only take over-the-counter or prescription medicines for pain, discomfort, or fever as directed by your caregiver.  Ask your caregiver if you should continue all prescribed and over-the-counter medicines.  Keep all follow-up appointments with your caregiver. SEEK MEDICAL CARE IF:  You have abdominal pain and it increases or stays in one area (localizes).  You have a rash, stiff neck, or severe headache.  You are irritable, sleepy, or difficult to awaken.  You are weak, dizzy, or extremely thirsty. SEEK IMMEDIATE MEDICAL CARE IF:   You are unable to keep fluids down or you get worse despite treatment.  You have frequent episodes of vomiting or diarrhea.  You have blood or green matter (bile) in your vomit.  You have blood in your stool or your stool looks black and tarry.  You have not urinated in 6 to 8 hours, or you have only urinated a small amount of very dark urine.  You have a fever.  You faint. MAKE SURE YOU:   Understand these instructions.  Will watch your condition.  Will get help right away if you are not doing well or get worse. Document Released: 04/11/2005 Document Revised: 07/04/2011 Document Reviewed: 11/29/2010 Exit[employer] Patient Information 2014 [employer].  

## 2013-05-21 NOTE — Telephone Encounter (Signed)
added f/u for 1/30. lmonvm for dtr lisa re change w/new time for 1/30 lb/GM @ 11am. also confirmed 1/28 appts and appt for today @ 3pm. per desk nurse lisa aware of appt today and will get new schedule when she comes in.per 1/27 pof (1st) pet scheduled @ [facility] per desk nurse

## 2013-05-21 NOTE — Telephone Encounter (Signed)
Call from Dr Tomasa Hosteller office at [facility] that Dr Jana Hakim emailed for a referral. Gave information to Jonelle Sidle in [facility] to forward all information to fax # [phone] for appt to be made. They are hoping to see patient on Monday 05/27/2013.

## 2013-05-22 ENCOUNTER — Other Ambulatory Visit (HOSPITAL_BASED_OUTPATIENT_CLINIC_OR_DEPARTMENT_OTHER): Payer: Medicare Other

## 2013-05-22 ENCOUNTER — Ambulatory Visit (HOSPITAL_BASED_OUTPATIENT_CLINIC_OR_DEPARTMENT_OTHER): Payer: Medicare Other | Admitting: Hematology and Oncology

## 2013-05-22 ENCOUNTER — Other Ambulatory Visit: Payer: Self-pay | Admitting: *Deleted

## 2013-05-22 ENCOUNTER — Ambulatory Visit (HOSPITAL_BASED_OUTPATIENT_CLINIC_OR_DEPARTMENT_OTHER): Payer: Medicare Other

## 2013-05-22 ENCOUNTER — Telehealth: Payer: Self-pay | Admitting: Oncology

## 2013-05-22 VITALS — BP 102/62 | HR 71 | Temp 97.9°F | Resp 18 | Ht 69.0 in | Wt 188.8 lb

## 2013-05-22 DIAGNOSIS — R1900 Intra-abdominal and pelvic swelling, mass and lump, unspecified site: Secondary | ICD-10-CM | POA: Diagnosis not present

## 2013-05-22 DIAGNOSIS — E860 Dehydration: Secondary | ICD-10-CM

## 2013-05-22 DIAGNOSIS — C8583 Other specified types of non-Hodgkin lymphoma, intra-abdominal lymph nodes: Secondary | ICD-10-CM | POA: Diagnosis not present

## 2013-05-22 DIAGNOSIS — C8590 Non-Hodgkin lymphoma, unspecified, unspecified site: Secondary | ICD-10-CM

## 2013-05-22 DIAGNOSIS — D696 Thrombocytopenia, unspecified: Secondary | ICD-10-CM | POA: Diagnosis not present

## 2013-05-22 DIAGNOSIS — D702 Other drug-induced agranulocytosis: Secondary | ICD-10-CM | POA: Diagnosis not present

## 2013-05-22 DIAGNOSIS — T451X5A Adverse effect of antineoplastic and immunosuppressive drugs, initial encounter: Secondary | ICD-10-CM | POA: Diagnosis not present

## 2013-05-22 DIAGNOSIS — C8580 Other specified types of non-Hodgkin lymphoma, unspecified site: Secondary | ICD-10-CM

## 2013-05-22 DIAGNOSIS — D6481 Anemia due to antineoplastic chemotherapy: Secondary | ICD-10-CM

## 2013-05-22 LAB — COMPREHENSIVE METABOLIC PANEL (CC13)
ALT: 44 U/L (ref 0–55)
ANION GAP: 12 meq/L — AB (ref 3–11)
AST: 16 U/L (ref 5–34)
Albumin: 3.5 g/dL (ref 3.5–5.0)
Alkaline Phosphatase: 83 U/L (ref 40–150)
BILIRUBIN TOTAL: 0.32 mg/dL (ref 0.20–1.20)
BUN: 38.2 mg/dL — ABNORMAL HIGH (ref 7.0–26.0)
CO2: 16 mEq/L — ABNORMAL LOW (ref 22–29)
CREATININE: 1.5 mg/dL — AB (ref 0.7–1.3)
Calcium: 8.7 mg/dL (ref 8.4–10.4)
Chloride: 108 mEq/L (ref 98–109)
Glucose: 241 mg/dl — ABNORMAL HIGH (ref 70–140)
Potassium: 3.9 mEq/L (ref 3.5–5.1)
Sodium: 136 mEq/L (ref 136–145)
Total Protein: 6.1 g/dL — ABNORMAL LOW (ref 6.4–8.3)

## 2013-05-22 LAB — CBC WITH DIFFERENTIAL/PLATELET
BASO%: 2.6 % — AB (ref 0.0–2.0)
Basophils Absolute: 0 10*3/uL (ref 0.0–0.1)
EOS%: 0.3 % (ref 0.0–7.0)
Eosinophils Absolute: 0 10*3/uL (ref 0.0–0.5)
HCT: 25 % — ABNORMAL LOW (ref 38.4–49.9)
HEMOGLOBIN: 8.5 g/dL — AB (ref 13.0–17.1)
LYMPH%: 8.8 % — AB (ref 14.0–49.0)
MCH: 27.8 pg (ref 27.2–33.4)
MCHC: 34.2 g/dL (ref 32.0–36.0)
MCV: 81.3 fL (ref 79.3–98.0)
MONO#: 0.1 10*3/uL (ref 0.1–0.9)
MONO%: 9.6 % (ref 0.0–14.0)
NEUT#: 0.8 10*3/uL — ABNORMAL LOW (ref 1.5–6.5)
NEUT%: 78.7 % — ABNORMAL HIGH (ref 39.0–75.0)
PLATELETS: 153 10*3/uL (ref 140–400)
RBC: 3.07 10*6/uL — ABNORMAL LOW (ref 4.20–5.82)
RDW: 15.3 % — ABNORMAL HIGH (ref 11.0–14.6)
WBC: 1.1 10*3/uL — ABNORMAL LOW (ref 4.0–10.3)
lymph#: 0.1 10*3/uL — ABNORMAL LOW (ref 0.9–3.3)

## 2013-05-22 MED ORDER — SODIUM CHLORIDE 0.9 % IJ SOLN
10.0000 mL | INTRAMUSCULAR | Status: DC | PRN
Start: 2013-05-22 — End: 2013-05-22
  Administered 2013-05-22: 10 mL via INTRAVENOUS
  Filled 2013-05-22: qty 10

## 2013-05-22 MED ORDER — SODIUM CHLORIDE 0.9 % IV SOLN
INTRAVENOUS | Status: DC
  Administered 2013-05-22: 16:00:00 via INTRAVENOUS

## 2013-05-22 MED ORDER — SODIUM CHLORIDE 0.9 % IV SOLN: INTRAVENOUS | Status: DC

## 2013-05-22 MED ORDER — HEPARIN SOD (PORK) LOCK FLUSH 100 UNIT/ML IV SOLN
500.0000 [IU] | Freq: Once | INTRAVENOUS | Status: AC
  Administered 2013-05-22: 500 [IU] via INTRAVENOUS
  Filled 2013-05-22: qty 5

## 2013-05-22 NOTE — Telephone Encounter (Signed)
, °

## 2013-05-22 NOTE — Telephone Encounter (Signed)
FAXED PT [place]. SLIDES AND SCANS WILL BE FEDEX'ED

## 2013-05-22 NOTE — Patient Instructions (Signed)
Dehydration  Dehydration means your body does not have as much fluid as it needs. Your kidneys, brain, and heart will not work properly without the right amount of fluids and salt. Older adults are more likely to become dehydrated than younger adults. This is because:   Their bodies do not hold water as well.  Their bodies do not respond to temperature changes as well.  They do not get thirsty as easily or as quickly. HOME CARE  Ask your doctor how to replace body fluid losses (rehydrate).  Drink enough fluids to keep your pee (urine) clear or pale yellow.  Drink small amounts of fluids often if you feel sick to your stomach (nauseous) or throw up (vomit).  Eat like you normally do.  Avoid:  Foods or drinks high in sugar.  Bubbly (carbonated) drinks.  Juice.  Very hot or cold fluids.  Drinks with caffeine.  Fatty, greasy foods.  Alcohol.  Tobacco.  Eating too much.  Gelatin desserts.  Wash your hands to avoid spreading germs (bacteria, viruses).  Only take medicine as told by your doctor.  Keep all doctor visits as told. GET HELP IF:  You have belly (abdominal) pain that gets worse or stays in one spot (localizes).  You have a rash, stiff neck, or bad headache.  You get easily annoyed, sleepy, or are hard to wake up.  You feel weak, dizzy, or very thirsty. GET HELP RIGHT AWAY IF:   You cannot drink fluid without throwing up.  You get worse even with treatment.  You throw up often.  You have watery poop (diarrhea) often.  Your vomit has blood in it or looks greenish.  Your poop (stool) has blood in it or looks black and tarry.  You have not peed in 6 to 8 hours or have only peed a small amount of very dark pee.  You have a fever.  You pass out (faint). MAKE SURE YOU:   Understand these instructions.  Will watch your condition.  Will get help right away if you are not doing well or get worse. Document Released: 03/31/2011 Document  Revised: 01/30/2013 Document Reviewed: 12/17/2012 Exit[employer] Patient Information 2014 [employer].  

## 2013-05-22 NOTE — Progress Notes (Signed)
ID: [name] OB: [birth year]  MR#: [id number]  JGO#:[id number]  PCP: Michael Pel, MD GYN:   SU: [name] OTHER MD: [name], [name]  CHIEF COMPLAINT:  Patient is here for follow up visit  after 2nd cycle of RICE chemotherapy   HISTORY OF PRESENT ILLNESS:  [name] original note dated 06/28/2004:  The patient developed right upper quadrant pain last year and he had an ultrasound April 5th which showed some gallstones without evidence of cholecystitis or ductal dilatation.  However, there were multiple lesions in the liver which could not be assessed further.  Accordingly on July 31, 2003, [name], showed multiple liver masses, more than 25, most over 1 cm, the largest being in the inferior right lobe, measuring 4.2 cm. There was also a small mass in the spleen.  CT of the pelvis was unremarkable.   The patient had a biopsy of the liver August 01, 2003.  The report says only that it was a lesion in the right lobe of the liver.  Presumably this was the largest lesion present.  The final pathology [id number] and [id number]) showed only cirrhosis.    The patient has been followed by [name], and a repeat CT scan of the abdomen and pelvis was obtained May 18, 2004.  Many of the liver lesions seen previously had actually decreased in size.  However, [name], [name], a second ultrasound-guided biopsy was performed 06/11/04. This was a lesion deep in the left lobe of the liver and therefore, [name], negative for synaptophysin, thyroid transcription factor, a variety of cytokeratins, [name], alpha-fetoprotein and COX-2."  [name] was subsequently evaluated at [facility] by Dr. Otelia [name] and repeat liver biopsy and review of the earlier biopsy here showed a primary hepatic lymphoma. The patient was treated with R-CHOP as detailed below and achieved a complete response. He received maintenance rituximab until 2008  More recently the patient has been complaining of worsening back pain. It felt "like a kidney stone".  INTERVAL HISTORY: [name] is a 80 years old pleasant male with recurrent NHL is here for followup after second cycle of Rice chemotherapy that he completed on 05/17/2013 in the hospital. He is accompanied by his daughter [name]. He received Neulasta on 05/18/2013. He is scheduled to have the PET scan tomorrow. He did receive IV fluids yesterday for dehydration and is here today for followup on the labs.  Today he denies any fever, chills, shortness of breath ,chest pain, palpitation denies any constipation ,blood in the stool ,[name], the patient undergoing prostatectomy January 2001 under [name] for a Gleason 7, [name], minor coronary artery disease, hypertension, hypertriglyceridemia, cholelithiasis, colon polyps, cirrhosis by biopsy, diabetes, squamous cell carcinomas of the skin removed by [name], history of nephrolithiasis, status post right renal [name] and history of left rotator cuff repair under [name].    PAST SURGICAL HISTORY: Past Surgical History  Procedure Laterality Date   Prostatectomy     Kidney stone [name]     Rotator cuff repair     Ankle [name]     Cardiac catheterization      Hx: of 1970's   Colonoscopy      Hx: of   Infusion port  04/04/2013    RIGHT SUBCLAVIAN   Cholecystectomy  04/04/2013   Cholecystectomy N/A 04/04/2013    Procedure: LAPAROSCOPIC CHOLECYSTECTOMY WITH INTRAOPERATIVE CHOLANGIOGRAM;  Surgeon: Michael Regal, MD;  [name];  Service: General;  [name];   Portacath placement N/A 04/04/2013    Procedure: INSERTION PORT-A-CATH;  Surgeon: Michael Regal, MD;  [name];  Service: General;  [name];    [name], 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, [name], a homemaker, [name], [name]; [name], [name]; and [name] is Environmental consultant.  Everybody lives in [place].  The patient has five grandchildren.  He is a member of [employer]. [employer].    ADVANCED DIRECTIVES: in place   HEALTH MAINTENANCE: History  Substance Use Topics   Smoking status: Former Smoker    Types: Cigarettes, Pipe, Cigars   Smokeless tobacco: Never Used     Comment: QUIT SMOKING MANY YEARS AGO "   Alcohol Use: No     Colonoscopy:  PSA: 8.01 (NOV 2014)  Bone density:  Lipid panel:  Allergies  Allergen Reactions   Niacin Other (See Comments)    headaches    Current Outpatient Prescriptions  Medication Sig Dispense Refill   allopurinol (ZYLOPRIM) 300 MG tablet Take 1 tablet (300 mg total) by mouth daily.  30 tablet  3  amLODipine (NORVASC) 5 MG tablet Take 5 mg by mouth daily.       aspirin 81 MG tablet Take 81 mg by mouth daily.       atenolol (TENORMIN) 100 MG tablet Take 100 mg by mouth daily.       atorvastatin (LIPITOR) 20 MG tablet Take 20 mg by mouth daily.       cetirizine (ZYRTEC) 10 MG tablet Take 10 mg by mouth daily as needed for allergies.        cholecalciferol (VITAMIN D) 1000 UNITS tablet Take 1,000 Units by mouth daily.       Cyanocobalamin (VITAMIN B 12 PO) Take 2,500 mg by mouth daily.        diazepam (VALIUM) 5 MG tablet Take 2.5-5 mg by mouth at bedtime as needed for anxiety or muscle spasms.       ezetimibe (ZETIA) 10 MG tablet Take 10 mg by mouth daily.       fenofibrate 160 MG tablet Take 160 mg by mouth daily.       gabapentin (NEURONTIN) 300 MG capsule Take 300 mg by mouth 2 (two) times daily.       hydrochlorothiazide (HYDRODIURIL) 25 MG tablet Take 25 mg by mouth daily.       insulin NPH (HUMULIN N,NOVOLIN N) 100 UNIT/ML injection Inject 34-36 Units into the skin. 36 units the morning and 34 after supper       lisinopril  (PRINIVIL,ZESTRIL) 40 MG tablet Take 40 mg by mouth daily.       metFORMIN (GLUCOPHAGE) 1000 MG tablet Take 1,000 mg by mouth 2 (two) times daily with a meal.       Multiple Vitamins-Minerals (MULTIVITAMIN WITH MINERALS) tablet Take 1 tablet by mouth daily.       oxyCODONE-acetaminophen (PERCOCET/ROXICET) 5-325 MG per tablet Take 1-2 tablets by mouth every 4 (four) hours as needed for severe pain.       prochlorperazine (COMPAZINE) 10 MG tablet Take this medication for nausea before meals and at bedtime until JAN 24 noon, then as needed  30 tablet  0   sertraline (ZOLOFT) 100 MG tablet Take 100 mg by mouth daily.       vitamin C (ASCORBIC ACID) 500 MG tablet Take 500 mg by mouth daily.       dexamethasone (DECADRON) 4 MG tablet Take 2 tablets (8 mg total) by mouth 2 (two) times daily with a meal.  8 tablet  0   HYDROcodone-acetaminophen (NORCO/VICODIN) 5-325 MG per tablet Take 1-2 tablets by mouth every 4 (four) hours as needed for moderate pain (Every 4 to 6 hours as needed for pain.).       Current Facility-Administered Medications  Medication Dose Route Frequency Provider Last Rate Last Dose   0.9 %  sodium chloride infusion   Intravenous Continuous Wilmon Arms, MD       Facility-Administered Medications Ordered in Other Visits  Medication Dose Route Frequency Provider Last Rate Last Dose   0.9 %  sodium chloride infusion   Intravenous Continuous Daxtyn Rottenberg, MD         Filed Vitals:   05/22/13 1426  BP: 102/62  Pulse: 71  Temp: 97.9 F (36.6 C)  Resp: 18     Body mass index is 27.87 kg/(m^2).      ECOG FS:1 - Symptomatic but completely ambulatory  HEENT PERRLA, sclerae anicteric , pupils equal, round and reactive to light Ear-nose-throat: Oropharynx clear,  no thrush or other lesions  Lymphatic:  No cervical or supraclavicular adenopathy; no axillary or inguinal adenopathy  Lungs no rales or rhonchi, good excursion bilaterally Heart regular rate and rhythm, no  murmur appreciated Abdomen:  soft, nontender, positive bowel sounds, positive prominent swelling noted in the left flank area,  soft , borders cannot be defined and it is nontender. It is approximately 10 cm in size.  MSK no focal spinal tenderness, no joint edema Neuro: non-focal, well-oriented,  positive  affect  LAB RESULTS:  CMP     Component Value Date/Time   NA 136 05/22/2013 1416   NA 136* 05/17/2013 0420   K 3.9 05/22/2013 1416   K 3.8 05/17/2013 0420   CL 92* 05/17/2013 0420   CO2 16* 05/22/2013 1416   CO2 30 05/17/2013 0420   GLUCOSE 241* 05/22/2013 1416   GLUCOSE 284* 05/17/2013 0420   BUN 38.2* 05/22/2013 1416   BUN 20 05/17/2013 0420   CREATININE 1.5* 05/22/2013 1416   CREATININE 0.87 05/17/2013 0420   CALCIUM 8.7 05/22/2013 1416   CALCIUM 7.3* 05/17/2013 0420   CALCIUM 11.0* 08/17/2005 0842   PROT 6.1* 05/22/2013 1416   PROT 5.9* 05/17/2013 0420   ALBUMIN 3.5 05/22/2013 1416   ALBUMIN 3.0* 05/17/2013 0420   AST 16 05/22/2013 1416   AST 22 05/17/2013 0420   ALT 44 05/22/2013 1416   ALT 28 05/17/2013 0420   ALKPHOS 83 05/22/2013 1416   ALKPHOS 65 05/17/2013 0420   BILITOT 0.32 05/22/2013 1416   BILITOT <0.2* 05/17/2013 0420   GFRNONAA 80* 05/17/2013 0420   GFRAA >90 05/17/2013 0420   Results for CYLER, KAPPES (MRN [id number]) as of 03/25/2013 18:32  Ref. Range 12/03/2009 08:35 03/29/2010 08:43 08/25/2010 09:31 03/02/2011 08:13 03/20/2013 09:16  LDH Latest Range: 125-245 U/L 135 119 151 128 157   IResults for LUISANTONIO, ADINOLFI (MRN [id number]) as of 03/25/2013 18:32  Ref. Range 12/03/2009 08:35 03/29/2010 08:43 08/25/2010 09:31 03/02/2011 08:13 03/20/2013 09:20  Beta-2 Microglobulin Latest Range: 1.01-1.73 mg/L 3.36 (H) 2.85 (H) 2.50 (H) 2.04 (H) 3.00 (H)   No results found for this basename: SPEP,  UPEP,   kappa and lambda light chains    Lab Results  Component Value Date   WBC 1.1* 05/22/2013   NEUTROABS 0.8* 05/22/2013   HGB 8.5* 05/22/2013   HCT 25.0* 05/22/2013   MCV 81.3 05/22/2013    PLT 153 05/22/2013      Chemistry      Component Value Date/Time   NA 136 05/22/2013 1416   NA 136* 05/17/2013 0420   K 3.9 05/22/2013 1416   K 3.8 05/17/2013 0420   CL 92* 05/17/2013 0420   CO2 16* 05/22/2013 1416   CO2 30 05/17/2013 0420   BUN 38.2* 05/22/2013 1416   BUN 20 05/17/2013 0420   CREATININE 1.5* 05/22/2013 1416   CREATININE 0.87 05/17/2013 0420      Component Value Date/Time   CALCIUM 8.7 05/22/2013 1416   CALCIUM 7.3* 05/17/2013 0420   CALCIUM 11.0* 08/17/2005 0842   ALKPHOS 83 05/22/2013 1416   ALKPHOS 65 05/17/2013 0420   AST 16 05/22/2013 1416   AST 22 05/17/2013 0420   ALT 44 05/22/2013 1416   ALT 28 05/17/2013 0420   BILITOT 0.32 05/22/2013 1416   BILITOT <0.2* 05/17/2013 0420      Urinalysis    Component Value Date/Time   COLORURINE YELLOW 05/17/2013 [place] 05/17/2013 0517   LABSPEC 1.012 05/17/2013 [place] 7.0 05/17/2013 0517  GLUCOSEU 500* 05/17/2013 0517   HGBUR NEGATIVE 05/17/2013 0517   BILIRUBINUR NEGATIVE 05/17/2013 0517   KETONESUR >80* 05/17/2013 0517   PROTEINUR NEGATIVE 05/17/2013 0517   UROBILINOGEN 0.2 05/17/2013 0517   NITRITE NEGATIVE 05/17/2013 0517   LEUKOCYTESUR NEGATIVE 05/17/2013 0517    STUDIES: Ct Abdomen Pelvis Wo Contrast  03/07/2013   CLINICAL DATA:  Back pain and right flank pain for 1 week. Dysuria. Diarrhea 1 week ago. History of prostatectomy 15 years ago. History of hepatocellular carcinoma with chemotherapy 3 years ago. Diabetes. Skin cancer.  EXAM: CT ABDOMEN AND PELVIS WITHOUT CONTRAST  TECHNIQUE: Multidetector CT imaging of the abdomen and pelvis was performed following the standard protocol without intravenous contrast.  COMPARISON:  10/13/2005  FINDINGS: Lower Chest: Bibasilar scarring or atelectasis. Mild cardiomegaly with dense coronary artery atherosclerosis. No pericardial or pleural effusion.  Abdomen/Pelvis: Cirrhosis. Scattered right hepatic lobe calcifications without well-defined residual or recurrent mass.  There is a 6 mm low-density focus adjacent the gallbladder on image 31/ series 2 which is nonspecific but was likely present on the prior exam.  Lateral segment left liver lobe heterogeneous calcifications are at the site of a dominant mass on the prior exam. Likely the sequelae of prior chemoembolization.  Normal spleen, stomach, left adrenal gland. Mild right adrenal nodularity is new and measures 1.4 cm on image 25/ series 2. Twenty-eight HU.  Moderate pancreatic atrophy. Cholelithiasis without acute cholecystitis or biliary ductal dilatation.  Scarred upper pole right kidney. No renal calculi or hydronephrosis. No hydroureter or ureteric calculi.  Soft tissue mass is identified within the periaortic and pericaval space. This is difficult to differentiate from the surrounding vasculature due to lack of IV contrast. The conglomerate of mass and vessels measures 6.5 x 5.5 cm on image 36/series 2. This continues in the retrocaval/ retroaortic space more inferiorly.  Underline aortic atherosclerosis.  No retrocrural adenopathy.  Scattered colonic diverticula. Normal terminal ileum and appendix. Normal small bowel without abdominal ascites. No evidence of omental or peritoneal disease.  Prior pelvic sidewall node dissection. No pelvic adenopathy. Normal urinary bladder. Prostate surgically absent by report. Bilateral left greater than right fat containing inguinal hernias. No significant free fluid.  Bones/Musculoskeletal: Advanced degenerative disc disease, including at L2-3 and L5-S1. Trace L5-S1 anterolisthesis.  IMPRESSION: 1.  No urinary tract calculi or hydronephrosis. 2. Soft tissue mass within the periaortic/ pericaval space of the retroperitoneum. Suspicious for lymphoma. Metastatic disease from one of the patient's primaries is felt much less likely. Consider nonemergent PET to direct tissue sampling. These results were called by telephone at the time of interpretation on 03/07/2013 at 11:02 AM to Dr. Katheren Shams , who verbally acknowledged these results. 3. Cholelithiasis. 4. Cirrhosis with treatment changes in the liver. No convincing evidence of recurrent or residual metastasis. 5. Right adrenal nodule which is indeterminate and new since 10/13/2005. This could also be evaluated at PET.   Electronically Signed   By: Abigail Miyamoto M.D.   On: 03/07/2013 11:03   Ct Foot Right Wo Contrast  03/04/2013   CLINICAL DATA:  Right foot pain and swelling for 7-10 days. History of Charcot change.  EXAM: CT OF THE RIGHT FOOT WITHOUT CONTRAST  TECHNIQUE: Multidetector CT imaging was performed according to the standard protocol. Multiplanar CT image reconstructions were also generated.  COMPARISON:  None.  FINDINGS: No fracture or dislocation is identified. There is extensive joint space narrowing with subchondral cyst formation and osteophytosis throughout the midfoot. Scattered small bony fragments are also  identified and best seen about the 2nd, 3rd and 4th tarsometatarsal joints. No bony destructive change to suggest osteomyelitis is present. No foreign body or soft tissue gas collection is seen. No fluid collection is identified. Extensive atherosclerotic vascular disease is noted.  IMPRESSION: No acute or focal abnormality.  Findings in the midfoot consistent with advanced osteoarthritis and mild to moderate neuropathic change are identified as described above.  Atherosclerosis.   Electronically Signed   By: Inge Rise M.D.   On: 03/04/2013 15:49   Ct Biopsy  03/19/2013   CLINICAL DATA:  80 year old male with a history of a both a pancreatic adenocarcinoma, and lymphoma now with an enlarging hypermetabolic (PET positive) para aortic retroperitoneal mass. CT-guided biopsy is requested to facilitate tissue diagnosis.  EXAM: CT GUIDED CORE BIOPSY OF PARA AORTIC RETROPERITONEAL MASS  ANESTHESIA/SEDATION: Two mg IV Versed; 100 mcg IV Fentanyl  Total Moderate Sedation Time: 2 minutes.  PROCEDURE: The procedure risks,  benefits, and alternatives were explained to the patient. Questions regarding the procedure were encouraged and answered. The patient understands and consents to the procedure.  The right back was prepped with Betadinein a sterile fashion, and a sterile drape was applied covering the operative field. A sterile gown and sterile gloves were used for the procedure. Local anesthesia was provided with 1% Lidocaine.  A planning axial CT scan was performed. The periaortic mass was successfully identified. A suitable skin entry site was selected and marked. Local anesthesia was attained by infiltration with 1% lidocaine. A small dermatotomy was made. Using intermittent CT guidance, a 15 cm 17 gauge trocar needle was carefully advanced through the right back, retroperitoneal space and into the margin of the periaortic mass. Multiple 18 gauge core biopsies were then coaxially obtained using the 18 gauge BioPince automated biopsy device.  Post biopsy axial CT imaging and demonstrates no evidence of immediate complication. Locules of gas are present within the biopsy site.  Complications: None immediate  FINDINGS: Successful identification of a periaortic retroperitoneal soft tissue mass.  IMPRESSION: Technically successful CT-guided biopsy of periaortic retroperitoneal mass.  Signed,  Criselda Peaches, MD  [facility]  [facility]   Electronically Signed   By: Jacqulynn Cadet M.D.   On: 03/19/2013 15:18   Patient: [name], [name] Collected: 03/19/2013 Client: [place] Accession: [id number] Received: 03/19/2013 Jacqulynn Cadet, MD DOB: 02-Jun-1931 Age: 25 Gender: M Reported: 03/22/2013 [address] Patient Ph: [phone] MRN #: [id number] [place], [place] [address] Visit #: [id number] Chart #: Phone:  Fax: CC: Michael Gemma, MD REPORT OF SURGICAL PATHOLOGY FINAL DIAGNOSIS Diagnosis Retroperitoneal mass, biopsy - DIFFUSE LARGE B-CELL  LYMPHOMA, CLINICALLY RECURRENT. - SEE ONCOLOGY TABLE. Microscopic Comment LYMPHOMA Histologic type: Non-Hodgkin lymphoma- diffuse large B-cell lymphoma Grade (if applicable): High grade. Flow cytometry: Not performed. Immunohistochemical stains: CD45, CD20, CD3, CD10, pancytokeratin. Touch preps/imprints: N/A. Comments: The biopsy consists predominately of necrotic tissue. There are a few foci of viable large highly atypical cells with irregular nuclear contours and prominent nucleoli. Immunohistochemistry reveals the cells are positive for CD45, CD20. CD3 highlights a few background T-completely T-cells. Pancytokeratin is negative. CD10 is non-contributory due to high levels of background staining. Overall, the morphologic and immunophenotypic findings are consistent with a diffuse large B-cell lymphoma. Given the patient's history these findings indicate recurrent disease. Vicente Males MD Pathologist, Electronic Signature (Case signed 03/22/2013)    ASSESSMENT/PLAN:  80 y.o. [place] man with a diagnosis of    #1 Primary hepatic non-Hodgkin's  lymphoma established through liver biopsy February 2006, treated with Rituxan x9 and then CHOP x6.  All chemotherapy completed in 2007.  Maintenance Rituxan discontinued October 2008.     #2  Biopsy proven retroperitoneal recurrence documented November 2014. Bone marrow biopsy 04/01/2013 was negative   #3 status post  laparoscopic cholecystectomy and port placement on 04/04/2013.  #4 Cycle #1  R-ICE chemotherapy  completed on 05/02/2013.   #5 Neulasta given on 05/04/2013  #6 completed second cycle of RICE-chemotherapy  on 05/17/2013  #7 Neulasta given on 05/18/2013  #8 Dehydration with elevated renal functions secondary to decreased by po intake: We'll arrange for  1 L of normal saline today  #9 leukopenia with neutropenia and anemia secondary to chemotherapy-induced: Patient received the Neulasta therapy.  he is afebrile at  present  #10.Prominent swelling noted in the left flank/lumbar area,  It is soft , borders cannot be defined and  nontender. It is approximately 10 cm in size: [name] is scheduled for the PET scan tomorrow and he'll be seeing Dr. Jana Hakim on 05/24/2013 to review the PET scan report to be  followed by assessment at [facility]  #11. CBC and differential and CMP on 05/24/2013   Patient and his daughter [name] understood the current plan of care and they  know to call  our office if any questions or concerns  Total time spent: 30 minutes  Wilmon Arms, MD   Medical oncology  05/22/2013 3:33 PM

## 2013-05-23 ENCOUNTER — Ambulatory Visit: Payer: Self-pay

## 2013-05-23 ENCOUNTER — Other Ambulatory Visit: Payer: Self-pay | Admitting: *Deleted

## 2013-05-23 DIAGNOSIS — C8589 Other specified types of non-Hodgkin lymphoma, extranodal and solid organ sites: Secondary | ICD-10-CM | POA: Diagnosis not present

## 2013-05-23 DIAGNOSIS — C8590 Non-Hodgkin lymphoma, unspecified, unspecified site: Secondary | ICD-10-CM

## 2013-05-24 ENCOUNTER — Other Ambulatory Visit: Payer: Medicare Other

## 2013-05-24 ENCOUNTER — Other Ambulatory Visit (HOSPITAL_BASED_OUTPATIENT_CLINIC_OR_DEPARTMENT_OTHER): Payer: Medicare Other

## 2013-05-24 ENCOUNTER — Telehealth: Payer: Self-pay | Admitting: Oncology

## 2013-05-24 ENCOUNTER — Ambulatory Visit (HOSPITAL_BASED_OUTPATIENT_CLINIC_OR_DEPARTMENT_OTHER): Payer: Medicare Other | Admitting: Oncology

## 2013-05-24 VITALS — BP 99/58 | HR 80 | Temp 98.3°F | Resp 18 | Ht 69.0 in | Wt 188.7 lb

## 2013-05-24 DIAGNOSIS — D61818 Other pancytopenia: Secondary | ICD-10-CM

## 2013-05-24 DIAGNOSIS — C8590 Non-Hodgkin lymphoma, unspecified, unspecified site: Secondary | ICD-10-CM

## 2013-05-24 DIAGNOSIS — Z8546 Personal history of malignant neoplasm of prostate: Secondary | ICD-10-CM

## 2013-05-24 DIAGNOSIS — D649 Anemia, unspecified: Secondary | ICD-10-CM | POA: Diagnosis not present

## 2013-05-24 DIAGNOSIS — E119 Type 2 diabetes mellitus without complications: Secondary | ICD-10-CM

## 2013-05-24 DIAGNOSIS — I2510 Atherosclerotic heart disease of native coronary artery without angina pectoris: Secondary | ICD-10-CM

## 2013-05-24 DIAGNOSIS — K5732 Diverticulitis of large intestine without perforation or abscess without bleeding: Secondary | ICD-10-CM | POA: Diagnosis not present

## 2013-05-24 DIAGNOSIS — C8583 Other specified types of non-Hodgkin lymphoma, intra-abdominal lymph nodes: Secondary | ICD-10-CM

## 2013-05-24 DIAGNOSIS — Z87898 Personal history of other specified conditions: Secondary | ICD-10-CM

## 2013-05-24 DIAGNOSIS — D696 Thrombocytopenia, unspecified: Secondary | ICD-10-CM

## 2013-05-24 DIAGNOSIS — D709 Neutropenia, unspecified: Secondary | ICD-10-CM | POA: Diagnosis not present

## 2013-05-24 DIAGNOSIS — K8021 Calculus of gallbladder without cholecystitis with obstruction: Secondary | ICD-10-CM

## 2013-05-24 DIAGNOSIS — E785 Hyperlipidemia, unspecified: Secondary | ICD-10-CM

## 2013-05-24 DIAGNOSIS — R590 Localized enlarged lymph nodes: Secondary | ICD-10-CM

## 2013-05-24 LAB — CBC WITH DIFFERENTIAL/PLATELET
BASO%: 2.8 % — AB (ref 0.0–2.0)
Basophils Absolute: 0 10*3/uL (ref 0.0–0.1)
EOS ABS: 0 10*3/uL (ref 0.0–0.5)
EOS%: 0 % (ref 0.0–7.0)
HCT: 24.3 % — ABNORMAL LOW (ref 38.4–49.9)
HGB: 8.2 g/dL — ABNORMAL LOW (ref 13.0–17.1)
LYMPH#: 0.1 10*3/uL — AB (ref 0.9–3.3)
LYMPH%: 15.8 % (ref 14.0–49.0)
MCH: 27.5 pg (ref 27.2–33.4)
MCHC: 33.9 g/dL (ref 32.0–36.0)
MCV: 81.2 fL (ref 79.3–98.0)
MONO#: 0.3 10*3/uL (ref 0.1–0.9)
MONO%: 37.2 % — ABNORMAL HIGH (ref 0.0–14.0)
NEUT#: 0.4 10*3/uL — CL (ref 1.5–6.5)
NEUT%: 44.2 % (ref 39.0–75.0)
Platelets: 120 10*3/uL — ABNORMAL LOW (ref 140–400)
RBC: 2.99 10*6/uL — ABNORMAL LOW (ref 4.20–5.82)
RDW: 15.4 % — AB (ref 11.0–14.6)
WBC: 0.9 10*3/uL — CL (ref 4.0–10.3)

## 2013-05-24 LAB — COMPREHENSIVE METABOLIC PANEL (CC13)
ALBUMIN: 3.6 g/dL (ref 3.5–5.0)
ALT: 39 U/L (ref 0–55)
ANION GAP: 10 meq/L (ref 3–11)
AST: 17 U/L (ref 5–34)
Alkaline Phosphatase: 67 U/L (ref 40–150)
BILIRUBIN TOTAL: 0.26 mg/dL (ref 0.20–1.20)
BUN: 23.2 mg/dL (ref 7.0–26.0)
CHLORIDE: 113 meq/L — AB (ref 98–109)
CO2: 17 meq/L — AB (ref 22–29)
Calcium: 9.1 mg/dL (ref 8.4–10.4)
Creatinine: 1.2 mg/dL (ref 0.7–1.3)
GLUCOSE: 140 mg/dL (ref 70–140)
POTASSIUM: 3.7 meq/L (ref 3.5–5.1)
SODIUM: 140 meq/L (ref 136–145)
TOTAL PROTEIN: 6.3 g/dL — AB (ref 6.4–8.3)

## 2013-05-24 NOTE — Progress Notes (Signed)
ID: [name] OB: [birth year]  MR#: [id number]  CSN#:[id number]  PCP: Horatio Pel, MD GYN:   SU: [name] OTHER MD: [name], [name]  CHIEF COMPLAINT:  Non-Hodgkin's lymphoma, currently status post 1 cycle of RICE chemotherapy on 04/29/2013.   HISTORY OF PRESENT ILLNESS:  Dr. [name] original note dated 06/28/2004:  The patient developed right upper quadrant pain last year and he had an ultrasound April 5th which showed some gallstones without evidence of cholecystitis or ductal dilatation.  However, there were multiple lesions in the liver which could not be assessed further.  Accordingly on July 31, 2003, [name], showed multiple liver masses, more than 25, most over 1 cm, the largest being in the inferior right lobe, measuring 4.2 cm. There was also a small mass in the spleen.  CT of the pelvis was unremarkable.   The patient had a biopsy of the liver August 01, 2003.  The report says only that it was "a lesion in the right lobe of the liver".  Presumably this was the largest lesion present.  The final pathology [id number] and [id number]) showed only cirrhosis.    The patient has been followed by [name], and a repeat CT scan of the abdomen and pelvis was obtained May 18, 2004.  Many of the liver lesions seen previously had actually decreased in size.  However, [name], [name], a second ultrasound-guided biopsy was performed 06/11/04. This was a lesion deep in the left lobe of the liver and therefore, I would think not the same one previously biopsied which was in the right liver. The pathology this  time [id number]) shows a poorly differentiated neuroendocrine carcinoma which was positive for chromogranin A, negative for synaptophysin, thyroid transcription factor, a variety of cytokeratins, [name], alpha-fetoprotein and COX-2."  Mr. [name] was subsequently evaluated at [facility] by Dr. Otelia Limes and repeat liver biopsy and review of the earlier biopsy here showed a primary hepatic lymphoma. The patient was treated with R-CHOP as detailed below and achieved a complete response. He received maintenance rituximab until 2008  A subsequent history is as detailed below  INTERVAL HISTORY:  Mr. [name] returns today accompanied by his daughter [name] for followup of his non-Hodgkin's lymphoma. Today he is day 11 cycle 2 RICE chemotherapy. After his discharge he had his repeat PET scan at [facility], which shows a significant response. The PET scan did suggest diverticulitis, right-sided   REVIEW OF SYSTEMS:  "[name]" denies fever, stomach cramps or pain, nausea or vomiting. He has had some loose bowel movements, perhaps as many as 4 a day. These have been normal in color, and there has been no blood. He does feel some fatigue. He has taste alteration, mild sinus problems, and sometimes he feels his vision is a little bit blurred. Otherwise a detailed review of systems today was entirely negative.  PAST MEDICAL HISTORY: Past Medical History  Diagnosis Date  . Hypertension   . Cancer of liver   . Kidney stone   . Prostate cancer   . Skin cancer   . Arthritis   .  Anemia   . GERD (gastroesophageal reflux disease)   . Lymphoma   . [name]   . Sleep apnea   . Neuropathy in diabetes     Hx: of  . Diabetes mellitus     INSULIN DEPENDENT  Significant for prostate cancer, the patient undergoing prostatectomy January 2001 under [facility] for a Gleason 7, pathologic T3b (positive seminal vesicle involvement) adenocarcinoma with 0 of 2 lymph nodes involved.  Current PSA is  8.0. Other medical problems include sleep apnea, minor coronary artery disease, hypertension, hypertriglyceridemia, cholelithiasis, colon polyps, cirrhosis by biopsy, diabetes, [name], history of nephrolithiasis, status post right renal surgery and history of left rotator cuff repair under [name].    PAST SURGICAL HISTORY: Past Surgical History  Procedure Laterality Date  . Prostatectomy    . Kidney stone surgery    . Rotator cuff repair    . Ankle surgery    . Cardiac catheterization      Hx: of 1970's  . Colonoscopy      Hx: of  . Infusion port  04/04/2013    RIGHT SUBCLAVIAN  . Cholecystectomy  04/04/2013  . Cholecystectomy N/A 04/04/2013    Procedure: LAPAROSCOPIC CHOLECYSTECTOMY WITH INTRAOPERATIVE CHOLANGIOGRAM;  [name], MD;  Location: [place];  Service: General;  [name];  . Portacath placement N/A 04/04/2013    Procedure: INSERTION PORT-A-CATH;  [name], MD;  Location: [place];  Service: General;  [name];    FAMILY HISTORY Family History  Problem Relation Age of Onset  . Heart disease Father   The patient's father died at the age of 37 from an MI.  The patient's mother died from "old age" at 74.  The patient is one of nine siblings.  One brother died from cancer of the esophagus, 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, [name], a homemaker, is present today as are the patient's three daughters: [name] is an Scientist, physiological for [employer]; [name], [name]; and [name] is Environmental consultant.  Everybody lives in [place].  The patient has five grandchildren.   He is a member of [place] [place].    ADVANCED DIRECTIVES: in place   HEALTH MAINTENANCE: History  Substance Use Topics  . Smoking status: Former Smoker    Types: Cigarettes, Pipe, Landscape architect  . Smokeless tobacco: Never Used     Comment: QUIT SMOKING MANY YEARS AGO "  . Alcohol Use: No     Colonoscopy:  PSA: 8.01 (NOV 2014)  Bone density:  Lipid panel:  Allergies  Allergen Reactions  . Niacin Other (See Comments)    headaches    Current Outpatient Prescriptions  Medication Sig Dispense Refill  . allopurinol (ZYLOPRIM) 300 MG tablet Take 1 tablet (300 mg total) by mouth daily.  30 tablet  3  . amLODipine (NORVASC) 5 MG tablet Take 5 mg by mouth daily.      Marland Kitchen aspirin 81 MG tablet Take 81 mg by mouth daily.      Marland Kitchen atenolol (TENORMIN) 100 MG tablet Take 100 mg by mouth daily.      Marland Kitchen  atorvastatin (LIPITOR) 20 MG tablet Take 20 mg by mouth daily.      . cetirizine (ZYRTEC) 10 MG tablet Take 10 mg by mouth daily as needed for allergies.       . cholecalciferol (VITAMIN D) 1000 UNITS tablet Take 1,000 Units by mouth daily.      . Cyanocobalamin (VITAMIN B 12 PO) Take 2,500 mg by mouth daily.       Marland Kitchen dexamethasone (DECADRON) 4 MG tablet Take 2 tablets (8 mg total) by mouth 2 (two) times daily with a meal.  8 tablet  0  . diazepam (VALIUM) 5 MG tablet Take 2.5-5 mg by mouth at bedtime as needed for anxiety or muscle spasms.      Marland Kitchen ezetimibe (ZETIA) 10 MG tablet Take 10 mg by mouth daily.      . fenofibrate 160 MG tablet Take 160 mg by mouth daily.      Marland Kitchen gabapentin (NEURONTIN) 300 MG capsule Take 300 mg by mouth 2 (two) times daily.      . hydrochlorothiazide (HYDRODIURIL) 25 MG tablet Take 25 mg by mouth daily.      Marland Kitchen HYDROcodone-acetaminophen (NORCO/VICODIN) 5-325 MG per tablet Take 1-2 tablets by mouth every 4 (four) hours as needed for moderate pain (Every 4 to 6 hours as needed for pain.).      Marland Kitchen insulin NPH (HUMULIN N,NOVOLIN N) 100 UNIT/ML injection Inject  34-36 Units into the skin. 36 units the morning and 34 after supper      . lisinopril (PRINIVIL,ZESTRIL) 40 MG tablet Take 40 mg by mouth daily.      . metFORMIN (GLUCOPHAGE) 1000 MG tablet Take 1,000 mg by mouth 2 (two) times daily with a meal.      . Multiple Vitamins-Minerals (MULTIVITAMIN WITH MINERALS) tablet Take 1 tablet by mouth daily.      Marland Kitchen oxyCODONE-acetaminophen (PERCOCET/ROXICET) 5-325 MG per tablet Take 1-2 tablets by mouth every 4 (four) hours as needed for severe pain.      Marland Kitchen prochlorperazine (COMPAZINE) 10 MG tablet Take this medication for nausea before meals and at bedtime until JAN 24 noon, then as needed  30 tablet  0  . sertraline (ZOLOFT) 100 MG tablet Take 100 mg by mouth daily.      . vitamin C (ASCORBIC ACID) 500 MG tablet Take 500 mg by mouth daily.       No current facility-administered medications for this visit.     Filed Vitals:   05/24/13 1100  BP: 99/58  Pulse: 80  Temp: 98.3 F (36.8 C)  Resp: 18     Body mass index is 27.85 kg/(m^2).     Filed Weights   05/24/13 1100  Weight: 188 lb 11.2 oz (85.594 kg)  ECOG FS:1 - Symptomatic but completely ambulatory  Older white man in no acute distress Sclerae unicteric, pupils equal and reactive Oropharynx clear and moist-- no thrush or other lesions No cervical or supraclavicular adenopathy, no axillary or inguinal adenopathy Lungs no rales or rhonchi Heart regular rate and rhythm Abd soft, nontender to extensive palpation, positive bowel sounds MSK no focal spinal tenderness, no upper extremity lymphedema Neuro: nonfocal, well oriented, appropriate affect    LAB RESULTS:   Lab Results  Component Value Date   WBC 0.9* 05/24/2013   NEUTROABS 0.4* 05/24/2013   HGB 8.2* 05/24/2013   HCT 24.3* 05/24/2013   MCV 81.2 05/24/2013   PLT 120* 05/24/2013      Chemistry      Component  Value Date/Time   NA 140 05/24/2013 1102   NA 136* 05/17/2013 0420   K 3.7 05/24/2013 1102   K 3.8 05/17/2013 0420   CL 92*  05/17/2013 0420   CO2 17* 05/24/2013 1102   CO2 30 05/17/2013 0420   BUN 23.2 05/24/2013 1102   BUN 20 05/17/2013 0420   CREATININE 1.2 05/24/2013 1102   CREATININE 0.87 05/17/2013 0420      Component Value Date/Time   CALCIUM 9.1 05/24/2013 1102   CALCIUM 7.3* 05/17/2013 0420   CALCIUM 11.0* 08/17/2005 0842   ALKPHOS 67 05/24/2013 1102   ALKPHOS 65 05/17/2013 0420   AST 17 05/24/2013 1102   AST 22 05/17/2013 0420   ALT 39 05/24/2013 1102   ALT 28 05/17/2013 0420   BILITOT 0.26 05/24/2013 1102   BILITOT <0.2* 05/17/2013 0420      STUDIES: Outside films reviewed. These previously 7.1 cm retroperitoneal nodal mass is currently difficult to measure, although there is residual SUV in that area (max 6.3, previously 13.2). The right adrenal mass previously noted has resolved. Stranding associated with the hepatic flexure in the region of multiple diverticula suggests active diverticulitis  ASSESSMENT/PLAN:  80 y.o. [place] man with a diagnosis of    #1 Primary hepatic non-Hodgkin's lymphoma established through liver biopsy February 2006, treated with Rituxan x9 and then CHOP x6.  All chemotherapy completed in 2007.  Maintenance Rituxan discontinued October 2008.     #2  Biopsy proven retroperitoneal recurrence documented November 2014. Bone marrow biopsy 04/01/2013 was negative   #3 Cycle #1  R-ICE chemotherapy, started on 04/29/2013, inpatient   #4 Neulasta given on 05/04/2013  #5 chemotherapy-induced pancytopenia, improved WBC and ANC. Continuing anemia with hemoglobin of 9.9 and thrombocytopenia with platelets of 63,000. The anemia is only slightly symptomatic. Thrombocytopenia is asymptomatic.   #6 started his second cycle of R-ICE chemotherapy 05/22/2013, received Neulasta 05/18/2013  #7 Patient to followup with [facility] transplant team for possible autotransplant   PLAN: Mr. Bohnenkamp tolerated his treatments remarkably well. He is now ready for consideration of autologous transplantation at  [facility] and has an appointment there already for next week. He will bring day 6 of his films as well as the written reports.  I am starting him on Augmentin twice a day for the diverticular disease, particularly in light of his continuing neutropenia. I asked him to call us if he develops a fever or any other worrisome symptoms.  Otherwise I have made him a return appointment here in one week. We may want to cancel that depending on what schedule he received from Dr. Tomasa Hosteller at the upcoming visit. [name] Has a good understanding of the overall plan, and agrees with that. He knows to call for any problems that may develop before the next visit here.  [name], MD   05/24/2013 1:30 PM

## 2013-05-24 NOTE — Telephone Encounter (Signed)
, °

## 2013-05-27 DIAGNOSIS — C8589 Other specified types of non-Hodgkin lymphoma, extranodal and solid organ sites: Secondary | ICD-10-CM | POA: Diagnosis not present

## 2013-05-28 ENCOUNTER — Other Ambulatory Visit: Payer: Self-pay | Admitting: Oncology

## 2013-05-31 ENCOUNTER — Encounter: Payer: Self-pay | Admitting: Physician Assistant

## 2013-05-31 ENCOUNTER — Ambulatory Visit (HOSPITAL_BASED_OUTPATIENT_CLINIC_OR_DEPARTMENT_OTHER): Payer: Medicare Other | Admitting: Oncology

## 2013-05-31 ENCOUNTER — Telehealth: Payer: Self-pay | Admitting: *Deleted

## 2013-05-31 ENCOUNTER — Other Ambulatory Visit (HOSPITAL_BASED_OUTPATIENT_CLINIC_OR_DEPARTMENT_OTHER): Payer: Medicare Other

## 2013-05-31 ENCOUNTER — Other Ambulatory Visit: Payer: Self-pay | Admitting: *Deleted

## 2013-05-31 ENCOUNTER — Ambulatory Visit: Payer: Medicare Other | Admitting: Physician Assistant

## 2013-05-31 VITALS — BP 117/66 | HR 73 | Temp 98.2°F | Resp 18 | Ht 69.0 in | Wt 194.7 lb

## 2013-05-31 DIAGNOSIS — C8589 Other specified types of non-Hodgkin lymphoma, extranodal and solid organ sites: Secondary | ICD-10-CM

## 2013-05-31 DIAGNOSIS — E860 Dehydration: Secondary | ICD-10-CM

## 2013-05-31 LAB — CBC WITH DIFFERENTIAL/PLATELET
BASO%: 0.2 % (ref 0.0–2.0)
Basophils Absolute: 0.1 10*3/uL (ref 0.0–0.1)
EOS%: 0 % (ref 0.0–7.0)
Eosinophils Absolute: 0 10*3/uL (ref 0.0–0.5)
HCT: 24 % — ABNORMAL LOW (ref 38.4–49.9)
HGB: 8.1 g/dL — ABNORMAL LOW (ref 13.0–17.1)
LYMPH#: 0.4 10*3/uL — AB (ref 0.9–3.3)
LYMPH%: 2 % — AB (ref 14.0–49.0)
MCH: 27.8 pg (ref 27.2–33.4)
MCHC: 33.6 g/dL (ref 32.0–36.0)
MCV: 82.8 fL (ref 79.3–98.0)
MONO#: 0.6 10*3/uL (ref 0.1–0.9)
MONO%: 3 % (ref 0.0–14.0)
NEUT#: 20.5 10*3/uL — ABNORMAL HIGH (ref 1.5–6.5)
NEUT%: 94.8 % — AB (ref 39.0–75.0)
Platelets: 65 10*3/uL — ABNORMAL LOW (ref 140–400)
RBC: 2.9 10*6/uL — AB (ref 4.20–5.82)
RDW: 15.7 % — ABNORMAL HIGH (ref 11.0–14.6)
WBC: 21.7 10*3/uL — ABNORMAL HIGH (ref 4.0–10.3)

## 2013-05-31 LAB — COMPREHENSIVE METABOLIC PANEL (CC13)
ALT: 25 U/L (ref 0–55)
AST: 21 U/L (ref 5–34)
Albumin: 3.7 g/dL (ref 3.5–5.0)
Alkaline Phosphatase: 92 U/L (ref 40–150)
Anion Gap: 11 mEq/L (ref 3–11)
BUN: 12.7 mg/dL (ref 7.0–26.0)
CHLORIDE: 99 meq/L (ref 98–109)
CO2: 27 mEq/L (ref 22–29)
CREATININE: 1 mg/dL (ref 0.7–1.3)
Calcium: 8.7 mg/dL (ref 8.4–10.4)
Glucose: 253 mg/dl — ABNORMAL HIGH (ref 70–140)
Potassium: 3.5 mEq/L (ref 3.5–5.1)
SODIUM: 137 meq/L (ref 136–145)
Total Bilirubin: 0.27 mg/dL (ref 0.20–1.20)
Total Protein: 6.5 g/dL (ref 6.4–8.3)

## 2013-05-31 NOTE — Progress Notes (Unsigned)
Mr. Mowat was originally on my schedule for an office visit today.  He is trying to make a decision regarding transplant versus continued chemotherapy. Subsequently, he was moved to Dr. [name] schedule per Dr. [name] request.  Micah Flesher, PA-C 05/31/2013

## 2013-05-31 NOTE — Progress Notes (Signed)
I met with [name] today for 45 minutes, accompanied by his 2 daughters. We discussed his overall situation, diagnoses, treatment options, and prognosis. Of course he went to [facility] and met with Dr. Tomasa Hosteller, whom he liked. He was quoted a 3% chance of significant heart damage and possibly death from the treatment and a 50% chance of being alive 5 years from now after transplant (versus perhaps 2 years without).  The problem is that [facility] was also given a list of things that he would not be able to do including gardening, and keeping his dogs, which are about the only 2 things he really enjoys. For that reason he is thinking currently of not having the transplant but proceeding only to 2 more cycles of the RICE chemotherapy and then rituximab maintenance.  Meanwhile Bessie had severe problems with her heart, then a GI bleed, and she is now very confused and very depressed and likely heading towards hospice. This is a major event in the family, quite aside from [name] solid tumor, and it is tolerating his decision, I am sure.  At this point he is agreeable to proceeding to 2 further cycles of chemotherapy. He will need to discuss the post transplant plans further with [facility] because I think with appropriate precautions he could keep his dog and work his garden without excessive concerned regarding infection.  After this discussion the plan is for [name] to return to 01/12/2014 for cycle 3 of RICE. His counts today would be adequate, but if there is a significant further drop particularly in his platelets we will have to postpone the treatment.

## 2013-05-31 NOTE — Telephone Encounter (Signed)
Per AGB off her schedule to [facility]....td

## 2013-05-31 NOTE — Telephone Encounter (Signed)
appts made and printed...td 

## 2013-06-03 ENCOUNTER — Other Ambulatory Visit: Payer: Self-pay | Admitting: Physician Assistant

## 2013-06-03 ENCOUNTER — Encounter (HOSPITAL_COMMUNITY): Payer: Self-pay

## 2013-06-03 ENCOUNTER — Ambulatory Visit: Payer: Medicare Other | Admitting: Hematology and Oncology

## 2013-06-03 ENCOUNTER — Other Ambulatory Visit (HOSPITAL_BASED_OUTPATIENT_CLINIC_OR_DEPARTMENT_OTHER): Payer: Medicare Other

## 2013-06-03 ENCOUNTER — Inpatient Hospital Stay (HOSPITAL_COMMUNITY)
Admission: AD | Admit: 2013-06-03 | Discharge: 2013-06-07 | DRG: 846 | Disposition: A | Discharge status: Home / Self Care | Payer: Medicare Other | Source: Ambulatory Visit | Attending: Oncology | Admitting: Oncology

## 2013-06-03 VITALS — BP 102/58 | HR 76 | Temp 98.8°F | Resp 18 | Ht 69.0 in | Wt 193.2 lb

## 2013-06-03 DIAGNOSIS — Z800 Family history of malignant neoplasm of digestive organs: Secondary | ICD-10-CM

## 2013-06-03 DIAGNOSIS — Z801 Family history of malignant neoplasm of trachea, bronchus and lung: Secondary | ICD-10-CM | POA: Diagnosis not present

## 2013-06-03 DIAGNOSIS — T8140XA Infection following a procedure, unspecified, initial encounter: Secondary | ICD-10-CM

## 2013-06-03 DIAGNOSIS — Z87898 Personal history of other specified conditions: Secondary | ICD-10-CM

## 2013-06-03 DIAGNOSIS — C8590 Non-Hodgkin lymphoma, unspecified, unspecified site: Secondary | ICD-10-CM | POA: Diagnosis present

## 2013-06-03 DIAGNOSIS — E119 Type 2 diabetes mellitus without complications: Secondary | ICD-10-CM | POA: Diagnosis not present

## 2013-06-03 DIAGNOSIS — C8299 Follicular lymphoma, unspecified, extranodal and solid organ sites: Secondary | ICD-10-CM

## 2013-06-03 DIAGNOSIS — I10 Essential (primary) hypertension: Secondary | ICD-10-CM | POA: Diagnosis not present

## 2013-06-03 DIAGNOSIS — E785 Hyperlipidemia, unspecified: Secondary | ICD-10-CM | POA: Diagnosis present

## 2013-06-03 DIAGNOSIS — Z8249 Family history of ischemic heart disease and other diseases of the circulatory system: Secondary | ICD-10-CM | POA: Diagnosis not present

## 2013-06-03 DIAGNOSIS — Z5111 Encounter for antineoplastic chemotherapy: Principal | ICD-10-CM

## 2013-06-03 DIAGNOSIS — C8583 Other specified types of non-Hodgkin lymphoma, intra-abdominal lymph nodes: Secondary | ICD-10-CM | POA: Diagnosis not present

## 2013-06-03 DIAGNOSIS — Z87442 Personal history of urinary calculi: Secondary | ICD-10-CM

## 2013-06-03 DIAGNOSIS — E1310 Other specified diabetes mellitus with ketoacidosis without coma: Secondary | ICD-10-CM | POA: Diagnosis present

## 2013-06-03 DIAGNOSIS — H538 Other visual disturbances: Secondary | ICD-10-CM | POA: Diagnosis present

## 2013-06-03 DIAGNOSIS — G4730 Sleep apnea, unspecified: Secondary | ICD-10-CM | POA: Diagnosis present

## 2013-06-03 DIAGNOSIS — C8589 Other specified types of non-Hodgkin lymphoma, extranodal and solid organ sites: Secondary | ICD-10-CM | POA: Diagnosis present

## 2013-06-03 DIAGNOSIS — Z8739 Personal history of other diseases of the musculoskeletal system and connective tissue: Secondary | ICD-10-CM

## 2013-06-03 DIAGNOSIS — C8580 Other specified types of non-Hodgkin lymphoma, unspecified site: Secondary | ICD-10-CM

## 2013-06-03 DIAGNOSIS — D6481 Anemia due to antineoplastic chemotherapy: Secondary | ICD-10-CM | POA: Diagnosis present

## 2013-06-03 DIAGNOSIS — Z6828 Body mass index (BMI) 28.0-28.9, adult: Secondary | ICD-10-CM | POA: Diagnosis not present

## 2013-06-03 DIAGNOSIS — R7881 Bacteremia: Secondary | ICD-10-CM

## 2013-06-03 DIAGNOSIS — T451X5A Adverse effect of antineoplastic and immunosuppressive drugs, initial encounter: Secondary | ICD-10-CM | POA: Diagnosis present

## 2013-06-03 DIAGNOSIS — K8021 Calculus of gallbladder without cholecystitis with obstruction: Secondary | ICD-10-CM

## 2013-06-03 DIAGNOSIS — Z7982 Long term (current) use of aspirin: Secondary | ICD-10-CM | POA: Diagnosis not present

## 2013-06-03 DIAGNOSIS — Z807 Family history of other malignant neoplasms of lymphoid, hematopoietic and related tissues: Secondary | ICD-10-CM | POA: Diagnosis not present

## 2013-06-03 DIAGNOSIS — T380X5A Adverse effect of glucocorticoids and synthetic analogues, initial encounter: Secondary | ICD-10-CM | POA: Diagnosis present

## 2013-06-03 DIAGNOSIS — Z8546 Personal history of malignant neoplasm of prostate: Secondary | ICD-10-CM | POA: Diagnosis not present

## 2013-06-03 DIAGNOSIS — H268 Other specified cataract: Secondary | ICD-10-CM | POA: Diagnosis not present

## 2013-06-03 DIAGNOSIS — L02419 Cutaneous abscess of limb, unspecified: Secondary | ICD-10-CM

## 2013-06-03 DIAGNOSIS — K219 Gastro-esophageal reflux disease without esophagitis: Secondary | ICD-10-CM | POA: Diagnosis present

## 2013-06-03 DIAGNOSIS — E1110 Type 2 diabetes mellitus with ketoacidosis without coma: Secondary | ICD-10-CM

## 2013-06-03 DIAGNOSIS — I2510 Atherosclerotic heart disease of native coronary artery without angina pectoris: Secondary | ICD-10-CM | POA: Diagnosis present

## 2013-06-03 DIAGNOSIS — L03119 Cellulitis of unspecified part of limb: Secondary | ICD-10-CM

## 2013-06-03 HISTORY — DX: Personal history of malignant neoplasm of prostate: Z85.46

## 2013-06-03 LAB — COMPREHENSIVE METABOLIC PANEL (CC13)
ALT: 26 U/L (ref 0–55)
AST: 23 U/L (ref 5–34)
Albumin: 3.7 g/dL (ref 3.5–5.0)
Alkaline Phosphatase: 74 U/L (ref 40–150)
Anion Gap: 10 mEq/L (ref 3–11)
BUN: 16.2 mg/dL (ref 7.0–26.0)
CALCIUM: 8.9 mg/dL (ref 8.4–10.4)
CHLORIDE: 102 meq/L (ref 98–109)
CO2: 25 mEq/L (ref 22–29)
CREATININE: 1 mg/dL (ref 0.7–1.3)
Glucose: 185 mg/dl — ABNORMAL HIGH (ref 70–140)
Potassium: 4 mEq/L (ref 3.5–5.1)
Sodium: 137 mEq/L (ref 136–145)
Total Bilirubin: 0.28 mg/dL (ref 0.20–1.20)
Total Protein: 6.6 g/dL (ref 6.4–8.3)

## 2013-06-03 LAB — CBC WITH DIFFERENTIAL/PLATELET
BASO%: 0.5 % (ref 0.0–2.0)
Basophils Absolute: 0.1 10*3/uL (ref 0.0–0.1)
EOS ABS: 0 10*3/uL (ref 0.0–0.5)
EOS%: 0 % (ref 0.0–7.0)
HEMATOCRIT: 25.6 % — AB (ref 38.4–49.9)
HEMOGLOBIN: 8.6 g/dL — AB (ref 13.0–17.1)
LYMPH%: 5.1 % — ABNORMAL LOW (ref 14.0–49.0)
MCH: 28 pg (ref 27.2–33.4)
MCHC: 33.6 g/dL (ref 32.0–36.0)
MCV: 83.4 fL (ref 79.3–98.0)
MONO#: 0.7 10*3/uL (ref 0.1–0.9)
MONO%: 5 % (ref 0.0–14.0)
NEUT#: 13.2 10*3/uL — ABNORMAL HIGH (ref 1.5–6.5)
NEUT%: 89.4 % — ABNORMAL HIGH (ref 39.0–75.0)
PLATELETS: 149 10*3/uL (ref 140–400)
RBC: 3.07 10*6/uL — AB (ref 4.20–5.82)
RDW: 17.5 % — ABNORMAL HIGH (ref 11.0–14.6)
WBC: 14.8 10*3/uL — ABNORMAL HIGH (ref 4.0–10.3)
lymph#: 0.8 10*3/uL — ABNORMAL LOW (ref 0.9–3.3)

## 2013-06-03 LAB — URINALYSIS, ROUTINE W REFLEX MICROSCOPIC
Bilirubin Urine: NEGATIVE
GLUCOSE, UA: NEGATIVE mg/dL
HGB URINE DIPSTICK: NEGATIVE
Ketones, ur: NEGATIVE mg/dL
Leukocytes, UA: NEGATIVE
Nitrite: NEGATIVE
Protein, ur: NEGATIVE mg/dL
SPECIFIC GRAVITY, URINE: 1.015 (ref 1.005–1.030)
UROBILINOGEN UA: 0.2 mg/dL (ref 0.0–1.0)
pH: 5.5 (ref 5.0–8.0)

## 2013-06-03 LAB — GLUCOSE, CAPILLARY
Glucose-Capillary: 118 mg/dL — ABNORMAL HIGH (ref 70–99)
Glucose-Capillary: 194 mg/dL — ABNORMAL HIGH (ref 70–99)

## 2013-06-03 LAB — LACTATE DEHYDROGENASE (CC13): LDH: 292 U/L — ABNORMAL HIGH (ref 125–245)

## 2013-06-03 LAB — MAGNESIUM: MAGNESIUM: 1.2 mg/dL — AB (ref 1.5–2.5)

## 2013-06-03 LAB — PHOSPHORUS: Phosphorus: 2.8 mg/dL (ref 2.3–4.6)

## 2013-06-03 MED ORDER — LORATADINE 10 MG PO TABS: 10.0000 mg | ORAL_TABLET | Freq: Every day | ORAL | Status: DC

## 2013-06-03 MED ORDER — INSULIN NPH (HUMAN) (ISOPHANE) 100 UNIT/ML ~~LOC~~ SUSP: 34.0000 [IU] | Freq: Every day | SUBCUTANEOUS | Status: DC

## 2013-06-03 MED ORDER — DOCUSATE SODIUM 100 MG PO CAPS
100.0000 mg | ORAL_CAPSULE | Freq: Two times a day (BID) | ORAL | Status: DC
  Administered 2013-06-03 – 2013-06-07 (×8): 100 mg via ORAL
  Filled 2013-06-03 (×10): qty 1

## 2013-06-03 MED ORDER — SERTRALINE HCL 100 MG PO TABS
100.0000 mg | ORAL_TABLET | Freq: Every day | ORAL | Status: DC
  Administered 2013-06-04 – 2013-06-07 (×4): 100 mg via ORAL
  Filled 2013-06-03 (×4): qty 1

## 2013-06-03 MED ORDER — ASPIRIN 81 MG PO CHEW
81.0000 mg | CHEWABLE_TABLET | Freq: Every day | ORAL | Status: DC
  Administered 2013-06-04 – 2013-06-07 (×4): 81 mg via ORAL
  Filled 2013-06-03 (×4): qty 1

## 2013-06-03 MED ORDER — VITAMIN D3 25 MCG (1000 UNIT) PO TABS
1000.0000 [IU] | ORAL_TABLET | Freq: Every day | ORAL | Status: DC
  Administered 2013-06-04 – 2013-06-07 (×4): 1000 [IU] via ORAL
  Filled 2013-06-03 (×4): qty 1

## 2013-06-03 MED ORDER — HYDROCODONE-ACETAMINOPHEN 5-325 MG PO TABS: 1.0000 | ORAL_TABLET | ORAL | Status: DC | PRN

## 2013-06-03 MED ORDER — ATENOLOL 100 MG PO TABS
100.0000 mg | ORAL_TABLET | Freq: Every day | ORAL | Status: DC
  Administered 2013-06-03 – 2013-06-06 (×4): 100 mg via ORAL
  Filled 2013-06-03 (×6): qty 1

## 2013-06-03 MED ORDER — LISINOPRIL 40 MG PO TABS
40.0000 mg | ORAL_TABLET | Freq: Every day | ORAL | Status: DC
  Administered 2013-06-04 – 2013-06-07 (×4): 40 mg via ORAL
  Filled 2013-06-03 (×4): qty 1

## 2013-06-03 MED ORDER — HYDROCHLOROTHIAZIDE 25 MG PO TABS
25.0000 mg | ORAL_TABLET | Freq: Every day | ORAL | Status: DC
Start: 2013-06-04 — End: 2013-06-07
  Administered 2013-06-04 – 2013-06-07 (×4): 25 mg via ORAL
  Filled 2013-06-03 (×4): qty 1

## 2013-06-03 MED ORDER — ALLOPURINOL 300 MG PO TABS
300.0000 mg | ORAL_TABLET | Freq: Every day | ORAL | Status: DC
  Administered 2013-06-04 – 2013-06-07 (×4): 300 mg via ORAL
  Filled 2013-06-03 (×4): qty 1

## 2013-06-03 MED ORDER — METFORMIN HCL 500 MG PO TABS
1000.0000 mg | ORAL_TABLET | Freq: Two times a day (BID) | ORAL | Status: DC
  Administered 2013-06-04 – 2013-06-07 (×7): 1000 mg via ORAL
  Filled 2013-06-03 (×11): qty 2

## 2013-06-03 MED ORDER — INSULIN NPH (HUMAN) (ISOPHANE) 100 UNIT/ML ~~LOC~~ SUSP
35.0000 [IU] | Freq: Every day | SUBCUTANEOUS | Status: DC
  Administered 2013-06-04 – 2013-06-07 (×4): 35 [IU] via SUBCUTANEOUS
  Filled 2013-06-03: qty 10

## 2013-06-03 MED ORDER — INSULIN NPH (HUMAN) (ISOPHANE) 100 UNIT/ML ~~LOC~~ SUSP
40.0000 [IU] | Freq: Every day | SUBCUTANEOUS | Status: DC
  Administered 2013-06-03 – 2013-06-06 (×4): 40 [IU] via SUBCUTANEOUS

## 2013-06-03 MED ORDER — INSULIN NPH (HUMAN) (ISOPHANE) 100 UNIT/ML ~~LOC~~ SUSP: 36.0000 [IU] | Freq: Every day | SUBCUTANEOUS | Status: DC

## 2013-06-03 MED ORDER — SODIUM CHLORIDE 0.9 % IV SOLN
INTRAVENOUS | Status: DC
  Administered 2013-06-03 – 2013-06-07 (×8): via INTRAVENOUS

## 2013-06-03 MED ORDER — MULTI-VITAMIN/MINERALS PO TABS: 1.0000 | ORAL_TABLET | Freq: Every day | ORAL | Status: DC

## 2013-06-03 MED ORDER — ATORVASTATIN CALCIUM 20 MG PO TABS
20.0000 mg | ORAL_TABLET | Freq: Every day | ORAL | Status: DC
  Administered 2013-06-03 – 2013-06-06 (×4): 20 mg via ORAL
  Filled 2013-06-03 (×6): qty 1

## 2013-06-03 MED ORDER — DIAZEPAM 5 MG PO TABS: 2.5000 mg | ORAL_TABLET | Freq: Every evening | ORAL | Status: DC | PRN

## 2013-06-03 MED ORDER — GABAPENTIN 300 MG PO CAPS
300.0000 mg | ORAL_CAPSULE | Freq: Two times a day (BID) | ORAL | Status: DC
  Administered 2013-06-03 – 2013-06-07 (×8): 300 mg via ORAL
  Filled 2013-06-03 (×10): qty 1

## 2013-06-03 MED ORDER — FENOFIBRATE 160 MG PO TABS
160.0000 mg | ORAL_TABLET | Freq: Every day | ORAL | Status: DC
  Administered 2013-06-03 – 2013-06-06 (×4): 160 mg via ORAL
  Filled 2013-06-03 (×6): qty 1

## 2013-06-03 MED ORDER — VITAMIN C 500 MG PO TABS
500.0000 mg | ORAL_TABLET | Freq: Every day | ORAL | Status: DC
  Administered 2013-06-04 – 2013-06-07 (×4): 500 mg via ORAL
  Filled 2013-06-03 (×4): qty 1

## 2013-06-03 MED ORDER — INSULIN ASPART 100 UNIT/ML ~~LOC~~ SOLN
0.0000 [IU] | Freq: Three times a day (TID) | SUBCUTANEOUS | Status: DC
  Administered 2013-06-04 (×2): 3 [IU] via SUBCUTANEOUS
  Administered 2013-06-04: 5 [IU] via SUBCUTANEOUS
  Administered 2013-06-05: 8 [IU] via SUBCUTANEOUS
  Administered 2013-06-05: 15 [IU] via SUBCUTANEOUS
  Administered 2013-06-05: 5 [IU] via SUBCUTANEOUS
  Administered 2013-06-06: 3 [IU] via SUBCUTANEOUS

## 2013-06-03 MED ORDER — EZETIMIBE 10 MG PO TABS
10.0000 mg | ORAL_TABLET | Freq: Every day | ORAL | Status: DC
  Administered 2013-06-03 – 2013-06-06 (×4): 10 mg via ORAL
  Filled 2013-06-03 (×6): qty 1

## 2013-06-03 MED ORDER — AMLODIPINE BESYLATE 5 MG PO TABS
5.0000 mg | ORAL_TABLET | Freq: Every day | ORAL | Status: DC
Start: 2013-06-04 — End: 2013-06-07
  Administered 2013-06-04 – 2013-06-07 (×4): 5 mg via ORAL
  Filled 2013-06-03 (×4): qty 1

## 2013-06-03 MED ORDER — ENOXAPARIN SODIUM 40 MG/0.4ML ~~LOC~~ SOLN: 40.0000 mg | SUBCUTANEOUS | Status: DC

## 2013-06-03 MED ORDER — ADULT MULTIVITAMIN W/MINERALS CH
1.0000 | ORAL_TABLET | Freq: Every day | ORAL | Status: DC
  Administered 2013-06-04 – 2013-06-07 (×4): 1 via ORAL
  Filled 2013-06-03 (×4): qty 1

## 2013-06-03 NOTE — Progress Notes (Signed)
Please refer to Hospital admission Note

## 2013-06-03 NOTE — H&P (Signed)
ID: [name] OB: 1932/01/05 MR#: [id number] VEL#[id number]  PCP: Horatio Pel, MD  GYN:  SU: [name]  OTHER MD: [name], [name], David Rizzieri  CHIEF COMPLAINT:  Admission for cycle 3 chemotherapy with RICE   HISTORY OF PRESENT ILLNESS:  From Dr. [name] original note 06/28/2004:   "The patient developed right upper quadrant pain last year and he had an ultrasound April 5th which showed some gallstones without evidence of cholecystitis or ductal dilatation. However, there were multiple lesions in the liver which could not be assessed further. Accordingly on July 31, 2003, [name], showed multiple liver masses, more than 25, most over 1 cm, the largest being in the inferior right lobe, measuring 4.2 cm. There was also a small mass in the spleen. CT of the pelvis was unremarkable.  The patient had a biopsy of the liver August 01, 2003. The report says only that it was "a lesion in the right lobe of the liver". Presumably this was the largest lesion present. The final pathology [phone] and [phone]) showed only cirrhosis.  The patient has been followed by [name], and a repeat CT scan of the abdomen and pelvis was obtained May 18, 2004. Many of the liver lesions seen previously had actually decreased in size. However, the lesion in the posterior aspect of the lateral segment of the left liver had grown to 7.3 cm. Previously it had measured 2.6 cm. Although it says that no focal abnormalities are seen in the spleen, there is clearly a lesion in the spleen which is likely the one seen previously. CT of the pelvis was essentially negative.  With this information, a second ultrasound-guided biopsy was performed 06/11/04. This was a lesion deep in the left lobe of the liver and therefore, I would think not the same one previously biopsied which was in the right liver. The pathology this time [phone]) shows a poorly  differentiated neuroendocrine carcinoma which was positive for chromogranin A, negative for synaptophysin, thyroid transcription factor, a variety of cytokeratins, [name], alpha-fetoprotein and COX-2."   Mr. Rehfeld was subsequently evaluated at [facility] by Dr. Elon Alas and repeat liver biopsy and review of the earlier biopsy here showed a primary hepatic lymphoma. The patient was treated with R-CHOP as detailed below and achieved a complete response. He received maintenance rituximab until 2008   More recently he presented with pain "like a kidney stone" and CTs were obtained11/13/2014, which showed no kidney stone but a soft tissue mass in the periaortic/pericaval space in the retroperitoneum. Biopsy of this mass 03/19/2013 confirmed recurrent diffuse large cell non-Hodgkin's lymphoma. Bone marrow biopsy 04/01/2013 showed a hypercellular bone marrow with no evidence of lymphoma. Patient alsounderwent a PET scan at [facility]. This showed activity in the retroperitoneal mass as well as in the right adrenal gland and in the lungs felt to be related to recurrent lymphoma.  The studies also showed cholelithiasis and with symptoms of cholecystitis the patient proceeded to laparoscopic cholecystectomy 04/04/2013 with benign pathology. A port was placed at the same time to facilitate chemotherapy access.  His subsequent history is detailed below.   INTERVAL HISTORY:  Mr Borba is here for followup visit and for initiation of cycle 3 chemotherapy with R-ICE. He has tolerated 2 cycles of chemotherapy with RICE very well with marked partial response noted on the PET scan report which was performed on 05/23/2013. He says that his abdominal pain is  resolved. He denies any shortness of breath, chest pain, palpitations,blurred vision,headaches, tingling or numbness. He denies any fever or chills or sweating, blood in the stool blood in the urine    REVIEW OF SYSTEMS:  14 review  point of systems is been assessed and pertinent symptoms were mentioned in interval history  PAST MEDICAL HISTORY:  Past Medical History   Diagnosis  Date   .  Diabetes mellitus    .  Hypertension    .  Cancer of liver    .  Kidney stone    .  Prostate cancer    .  Skin cancer    .  Arthritis    .  Anemia    .  GERD (gastroesophageal reflux disease)    .  Lymphoma    Significant for prostate cancer, the patient undergoing prostatectomy January 2001 under [facility] for a Gleason 7, pathologic T3b (positive seminal vesicle involvement) adenocarcinoma with 0 of 2 lymph nodes involved. Other medical problems include sleep apnea, minor coronary artery disease, hypertension, hypertriglyceridemia, cholelithiasis, colon polyps, cirrhosis by biopsy, diabetes, [name], history of nephrolithiasis, status post right renal surgery and history of left rotator cuff repair under [name].   PAST SURGICAL HISTORY:  Past Surgical History   Procedure  Laterality  Date   .  Prostatectomy     .  Kidney stone surgery     .  Rotator cuff repair     .  Ankle surgery      FAMILY HISTORY  Family History   Problem  Relation  Age of Onset   .  Heart disease  Father    The patient's father died at the age of 1 from an MI. The patient's mother died from "old age" at 75. The patient is one of nine siblings. One brother died from cancer of the esophagus, one sister with lymphoma and a half-brother with lung cancer.   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 He is now retired. His wife, [name], a homemaker, is present today as are the patient's three daughters: [name] is an Scientist, physiological for [employer] [facility] [employer]; [name], [name]; and [name] is Medical illustrator. Everybody lives in [place]. The patient has five grandchildren. He is a member of [place].   ADVANCED DIRECTIVES: in place   HEALTH MAINTENANCE:  History   Substance Use Topics   .  Smoking status:  Never Smoker   .  Smokeless tobacco:  Never Used   .  Alcohol Use:  No   Colonoscopy:  PSA: 8.01 (NOV 2014)  Bone density:  Lipid panel:    Allergies   Allergen  Reactions   .  Niacin  Other (See Comments)     headaches    Current Outpatient Prescriptions   Medication  Sig  Dispense  Refill   .  amLODipine (NORVASC) 5 MG tablet  Take 5 mg by mouth daily.     Marland Kitchen  aspirin 81 MG tablet  Take 81 mg by mouth daily.     Marland Kitchen  atenolol (TENORMIN) 100 MG tablet  Take 100 mg by mouth daily.     Marland Kitchen  atorvastatin (LIPITOR) 20 MG tablet  Take 20 mg by mouth daily.     Marland Kitchen  cetirizine (ZYRTEC) 10 MG tablet  Take 10 mg by mouth daily as needed for allergies.     .  Cyanocobalamin (VITAMIN B 12 PO)  Take 2,500 mg by mouth daily.     Marland Kitchen  ezetimibe (ZETIA) 10 MG tablet  Take 10 mg by mouth daily.     .  fenofibrate 160 MG tablet  Take 160 mg by mouth daily.     Marland Kitchen  gabapentin (NEURONTIN) 300 MG capsule  Take 300 mg by mouth 2 (two) times daily.     .  hydrochlorothiazide (HYDRODIURIL) 25 MG tablet  Take 25 mg by mouth daily.     .  insulin NPH (HUMULIN N,NOVOLIN N) 100 UNIT/ML injection  Inject 34-36 Units into the skin. 36 units the morning and 34 after supper     .  lisinopril (PRINIVIL,ZESTRIL) 40 MG tablet  Take 40 mg by mouth daily.     .  metFORMIN (GLUCOPHAGE) 1000 MG tablet  Take 1,000 mg by mouth 2 (two) times daily with a meal.     .  Multiple Vitamins-Minerals (MULTIVITAMIN WITH MINERALS) tablet  Take 1 tablet by mouth daily.     Marland Kitchen  oxyCODONE-acetaminophen (PERCOCET/ROXICET) 5-325 MG per tablet  Take 1-2 tablets by mouth every 4 (four) hours as needed for severe pain.     Marland Kitchen  sertraline (ZOLOFT) 100 MG tablet  Take 100 mg by mouth daily.       Scheduled  Meds:  Continuous Infusions:  PRN Meds:.     OBJECTIVE: Middle aged White male examined in bed  There were no vitals filed for this visit.  ECOG FS:0  Ocular: Sclerae unicteric, pupils equal and round Ear-nose-throat: Oropharynx clear, no thrush or other lesions  Lymphatic: No cervical or supraclavicular adenopathy; no axillary or inguinal adenopathy  Lungs no rales or rhonchi, examined anterolaterlly Heart regular rate and rhythm, no murmur appreciated  Abd soft, nontender, positive bowel sounds, no masses palpated  MSK no focal spinal tenderness, no joint edema  Neuro: non-focal, well-oriented, positive affect    LAB RESULTS:  Basic Metabolic Panel:  Recent Labs Lab 05/31/13 1115 06/03/13 1440  NA 137 137  K 3.5 4.0  CO2 27 25  GLUCOSE 253* 185*  BUN 12.7 16.2  CREATININE 1.0 1.0  CALCIUM 8.7 8.9   GFR The CrCl is unknown because both a height and weight (above a minimum accepted value) are required for this calculation. Liver Function Tests:  Recent Labs Lab 05/31/13 1115 06/03/13 1440  AST 21 23  ALT 25 26  ALKPHOS 92 74  BILITOT 0.27 0.28  PROT 6.5 6.6  ALBUMIN 3.7 3.7   No results found for this basename: LIPASE, AMYLASE,  in the last 168 hours No results found for this basename: AMMONIA,  in the last 168 hours Coagulation profile No results found for this basename: INR, PROTIME,  in the last 168 hours  CBC:  Recent Labs Lab 05/31/13 1115 06/03/13 1439  WBC 21.7* 14.8*  NEUTROABS 20.5* 13.2*  HGB 8.1* 8.6*  HCT 24.0* 25.6*  MCV 82.8 83.4  PLT 65* 149   Cardiac Enzymes: No results found for this basename: CKTOTAL, CKMB, CKMBINDEX, TROPONINI,  in the last 168 hours BNP: No components found with this basename: POCBNP,  CBG: No results found for this basename: GLUCAP,  in the last 168 hours D-Dimer No results found for this basename: DDIMER,  in the last 72 hours Hgb A1c No results found for this basename: HGBA1C,  in the last 72  hours Lipid Profile No results found for this basename: CHOL, HDL, LDLCALC, TRIG, CHOLHDL, LDLDIRECT,  in the last 72 hours Thyroid function studies No results found for this basename: TSH, T4TOTAL, FREET3, T3FREE, THYROIDAB,  in the last 72 hours Anemia work up No results found for this basename: VITAMINB12, FOLATE, FERRITIN, TIBC, IRON, RETICCTPCT,  in the last 72 hours Microbiology No results found for this or any previous visit (from the past 240 hour(s)).    STUDIES:   No results found.  Transthoracic Echocardiography  Patient: Johney, Perotti MR #: [id number] Study Date: 03/29/2013 Gender: M Age: 80 Height: 172.7cm Weight: 89.5kg BSA: 2.38m2 Pt. Status: Room:  PERFORMING LOphthalmic Out[facility] Center Partners LLCMagrinat, GHazle NordmannREFERRING PHoratio PelSONOGRAPHER GDiamond Nickelcc:  ------------------------------------------------------------ LV EF: 50% - 55%  ------------------------------------------------------------ Indications: V58.11 Chemotherapy Evaluation.  ------------------------------------------------------------ History: PMH: Lymphoma. Coronary artery disease. Risk factors: Diabetes mellitus. Dyslipidemia.  ------------------------------------------------------------ Study Conclusions  - Left ventricle: The cavity size was normal. Wall thickness was normal. Systolic function was normal. The estimated ejection fraction was in the range of 50% to 55%. Wall motion was normal; there were no regional wall motion abnormalities. Doppler parameters are consistent with abnormal left ventricular relaxation (grade 1 diastolic dysfunction). - Aortic valve: Moderately calcified annulus. Trileaflet; moderately thickened, moderately calcified leaflets. - Mitral valve: Calcified annulus. Mildly thickened leaflets . Mild regurgitation. - Left atrium: The atrium was moderately dilated. - Atrial septum: No defect or patent foramen ovale  was identified. Transthoracic echocardiography. M-mode, complete 2D, spectral Doppler, and color Doppler. Height: Height: 172.7cm. Height: 68in. Weight: Weight: 89.5kg. Weight: 197lb. Body mass index: BMI: 30kg/m^2. Body surface area: BSA: 2.161m. Blood pressure: 129/62. Patient status: Outpatient. Location: Echo laboratory.  ------------------------------------------------------------  ------------------------------------------------------------ Left ventricle: The cavity size was normal. Wall thickness was normal. Systolic function was normal. The estimated ejection fraction was in the range of 50% to 55%. Wall motion was normal; there were no regional wall motion abnormalities. Doppler parameters are consistent with abnormal left ventricular relaxation (grade 1 diastolic dysfunction).   ASSESSMENT: 89.o. [place] with a diagnosis of primary hepatic non-Hodgkin's lymphoma established through liver biopsy February 2006, treated with Rituxan x9 and then CHOP x6. All chemotherapy completed in 2007. Maintenance Rituxan discontinued October 2008.   (1) biopsy proven retroperitoneal recurrence documented November 2014; bone marrow biopsy 04/01/2013 negative  (2) on 04/29/2013 rceived the first of two planned cycles of R-ICE chemotherapy with a view [facility]) cycle 2 RICE completed on 05/17/2012  (4) PET scan performed on 05/23/2013 revealed marked partial response to chemotherapy with a retroperitoneal mass decrease in metabolic activity from SUV of 13.2 to 6.3. Right adrenal mass size and metabolic activity is resolved when compared to the previous PET scan.   (5) [facility] Hosteller[facility] autologuos transplant on 05/27/2013 and was quoted a 3% chance of significant heart damage and possibly death from the treatment and a 50% chance of being alive 5 years from now after transplant (versus perhaps 2 years without). Mr.  GrKnothill f/u with  Dr. RiTomasa Hostelleror transplant after 2 more cycles of the RICE chemotherapy   (6) We'll initiate third cycle of RICE chemotherapy on 06/04/2013   PLAN:  We'll admit Mr. GrWiebelhaustoday for initiation of cycle 3 chemotherapy with RICE starting from tomorrow. His labs are acceptable for treatment. I have discussed the PET scan response after 2 cycles  of chemotherapy With DR. Magrinat.  He will be receiving the chemotherapy for 3 days followed by Neulasta growth factor support the day after each chemotherapy. We'll continue total of 4 cycles of RICE chemotherapy followed by Rituxan maintenance/[facility] f/u for possible autologous transplant as recommended Closely monitor his condition with regular followups and labs.  Next followup visit in medical oncology clinic in 1 week  Wilmon Arms, MD 06/03/2013 3:54 PM

## 2013-06-04 DIAGNOSIS — Z5111 Encounter for antineoplastic chemotherapy: Secondary | ICD-10-CM | POA: Diagnosis not present

## 2013-06-04 DIAGNOSIS — H268 Other specified cataract: Secondary | ICD-10-CM | POA: Diagnosis not present

## 2013-06-04 DIAGNOSIS — C8583 Other specified types of non-Hodgkin lymphoma, intra-abdominal lymph nodes: Secondary | ICD-10-CM | POA: Diagnosis not present

## 2013-06-04 LAB — CBC
HEMATOCRIT: 22.5 % — AB (ref 39.0–52.0)
Hemoglobin: 7.7 g/dL — ABNORMAL LOW (ref 13.0–17.0)
MCH: 28.4 pg (ref 26.0–34.0)
MCHC: 34.2 g/dL (ref 30.0–36.0)
MCV: 83 fL (ref 78.0–100.0)
PLATELETS: 126 10*3/uL — AB (ref 150–400)
RBC: 2.71 MIL/uL — AB (ref 4.22–5.81)
RDW: 17.5 % — ABNORMAL HIGH (ref 11.5–15.5)
WBC: 9.3 10*3/uL (ref 4.0–10.5)

## 2013-06-04 LAB — GLUCOSE, CAPILLARY
GLUCOSE-CAPILLARY: 166 mg/dL — AB (ref 70–99)
Glucose-Capillary: 155 mg/dL — ABNORMAL HIGH (ref 70–99)
Glucose-Capillary: 234 mg/dL — ABNORMAL HIGH (ref 70–99)
Glucose-Capillary: 266 mg/dL — ABNORMAL HIGH (ref 70–99)

## 2013-06-04 LAB — COMPREHENSIVE METABOLIC PANEL
ALBUMIN: 3.2 g/dL — AB (ref 3.5–5.2)
ALT: 24 U/L (ref 0–53)
AST: 25 U/L (ref 0–37)
Alkaline Phosphatase: 64 U/L (ref 39–117)
BILIRUBIN TOTAL: 0.2 mg/dL — AB (ref 0.3–1.2)
BUN: 11 mg/dL (ref 6–23)
CO2: 24 mEq/L (ref 19–32)
Calcium: 8.2 mg/dL — ABNORMAL LOW (ref 8.4–10.5)
Chloride: 100 mEq/L (ref 96–112)
Creatinine, Ser: 0.74 mg/dL (ref 0.50–1.35)
GFR calc Af Amer: >90 mL/min (ref >90)
GFR calc non Af Amer: 85 mL/min — ABNORMAL LOW (ref >90)
Glucose, Bld: 160 mg/dL — ABNORMAL HIGH (ref 70–99)
POTASSIUM: 3.7 meq/L (ref 3.7–5.3)
SODIUM: 137 meq/L (ref 137–147)
TOTAL PROTEIN: 5.9 g/dL — AB (ref 6.0–8.3)

## 2013-06-04 MED ORDER — SODIUM CHLORIDE 0.9 % IJ SOLN: 10.0000 mL | INTRAMUSCULAR | Status: DC | PRN

## 2013-06-04 MED ORDER — SODIUM CHLORIDE 0.9 % IV SOLN
Freq: Once | INTRAVENOUS | Status: AC
  Administered 2013-06-04: 20 mg via INTRAVENOUS
  Filled 2013-06-04: qty 8

## 2013-06-04 MED ORDER — HEPARIN SOD (PORK) LOCK FLUSH 100 UNIT/ML IV SOLN: 250.0000 [IU] | Freq: Once | INTRAVENOUS | Status: AC | PRN

## 2013-06-04 MED ORDER — HEPARIN SOD (PORK) LOCK FLUSH 100 UNIT/ML IV SOLN: 500.0000 [IU] | Freq: Once | INTRAVENOUS | Status: AC | PRN

## 2013-06-04 MED ORDER — HOT PACK MISC ONCOLOGY
1.0000 | Freq: Once | Status: AC | PRN
  Filled 2013-06-04: qty 1

## 2013-06-04 MED ORDER — SODIUM CHLORIDE 0.9 % IV SOLN
100.0000 mg/m2 | Freq: Once | INTRAVENOUS | Status: AC
  Administered 2013-06-04: 210 mg via INTRAVENOUS
  Filled 2013-06-04: qty 10.5

## 2013-06-04 MED ORDER — ACETAMINOPHEN 325 MG PO TABS
650.0000 mg | ORAL_TABLET | Freq: Once | ORAL | Status: AC
  Administered 2013-06-04: 650 mg via ORAL
  Filled 2013-06-04: qty 2

## 2013-06-04 MED ORDER — ALTEPLASE 2 MG IJ SOLR
2.0000 mg | Freq: Once | INTRAMUSCULAR | Status: AC | PRN
  Filled 2013-06-04: qty 2

## 2013-06-04 MED ORDER — SODIUM CHLORIDE 0.9 % IV SOLN: INTRAVENOUS | Status: DC

## 2013-06-04 MED ORDER — MAGNESIUM SULFATE IN D5W 10-5 MG/ML-% IV SOLN
1.0000 g | Freq: Once | INTRAVENOUS | Status: AC
  Administered 2013-06-04: 1 g via INTRAVENOUS
  Filled 2013-06-04 (×3): qty 100

## 2013-06-04 MED ORDER — SODIUM CHLORIDE 0.9 % IV SOLN
375.0000 mg/m2 | Freq: Once | INTRAVENOUS | Status: AC
  Administered 2013-06-04: 800 mg via INTRAVENOUS
  Filled 2013-06-04: qty 80

## 2013-06-04 MED ORDER — SODIUM CHLORIDE 0.9 % IJ SOLN: 3.0000 mL | INTRAMUSCULAR | Status: DC | PRN

## 2013-06-04 MED ORDER — DIPHENHYDRAMINE HCL 25 MG PO CAPS
25.0000 mg | ORAL_CAPSULE | Freq: Once | ORAL | Status: AC
  Administered 2013-06-04: 25 mg via ORAL
  Filled 2013-06-04: qty 1

## 2013-06-04 NOTE — Progress Notes (Signed)
[name]   DOB:12/26/1931   HG#:[id number]   HDQ#:[id number]  Subjective: [name] is sitting on a recliner eating breakfast. He had an uneventful night. He denies any shortness of breath, weakness, dizziness, or palpitations. He does not feel fatigued. A detailed review of systems today was otherwise noncontributory. No family in room   Objective: Middle-aged white male examined on recliner Filed Vitals:   06/04/13 1204  BP: 117/58  Pulse: 60  Temp: 97.7 F (36.5 C)  Resp: 12    Body mass index is 28.52 kg/(m^2).  Intake/Output Summary (Last 24 hours) at 06/04/13 1530 Last data filed at 06/04/13 1419  Gross per 24 hour  Intake 4577.33 ml  Output   2100 ml  Net 2477.33 ml     Sclerae unicteric, pupils equal and reactive  Oropharynx clear, no lesions noted  No peripheral adenopathy  Lungs clear -- no rales or rhonchi  Heart regular rate and rhythm  Abdomen soft, nontender, positive bowel sounds,   MSK no focal spinal tenderness, no peripheral edema  Neuro nonfocal, well oriented, positive affect  CBG (last 3)   Recent Labs  06/03/13 2203 06/04/13 0726 06/04/13 1145  GLUCAP 194* 166* 155*     Labs:  Lab Results  Component Value Date   WBC 9.3 06/04/2013   HGB 7.7* 06/04/2013   HCT 22.5* 06/04/2013   MCV 83.0 06/04/2013   PLT 126* 06/04/2013   NEUTROABS 13.2* 06/03/2013    @LASTCHEMISTRY @  Urine Studies No results found for this basename: UACOL, UAPR, USPG, UPH, UTP, UGL, UKET, UBIL, UHGB, UNIT, UROB, ULEU, UEPI, UWBC, URBC, UBAC, CAST, CRYS, UCOM, BILUA,  in the last 72 hours  Basic Metabolic Panel:  Recent Labs Lab 05/31/13 1115 06/03/13 1440 06/03/13 1700 06/04/13 0615  NA 137 137  --  137  K 3.5 4.0  --  3.7  CL  --   --   --  100  CO2 27 25  --  24  GLUCOSE 253* 185*  --  160*  BUN 12.7 16.2  --  11  CREATININE 1.0 1.0  --  0.74  CALCIUM 8.7 8.9  --  8.2*  MG  --   --  1.2*  --   PHOS  --   --  2.8  --    GFR Estimated Creatinine Clearance:  82.1 ml/min (by C-G formula based on Cr of 0.74). Liver Function Tests:  Recent Labs Lab 05/31/13 1115 06/03/13 1440 06/04/13 0615  AST 21 23 25   ALT 25 26 24   ALKPHOS 92 74 64  BILITOT 0.27 0.28 0.2*  PROT 6.5 6.6 5.9*  ALBUMIN 3.7 3.7 3.2*   No results found for this basename: LIPASE, AMYLASE,  in the last 168 hours No results found for this basename: AMMONIA,  in the last 168 hours Coagulation profile No results found for this basename: INR, PROTIME,  in the last 168 hours  CBC:  Recent Labs Lab 05/31/13 1115 06/03/13 1439 06/04/13 0615  WBC 21.7* 14.8* 9.3  NEUTROABS 20.5* 13.2*  --   HGB 8.1* 8.6* 7.7*  HCT 24.0* 25.6* 22.5*  MCV 82.8 83.4 83.0  PLT 65* 149 126*   Cardiac Enzymes: No results found for this basename: CKTOTAL, CKMB, CKMBINDEX, TROPONINI,  in the last 168 hours BNP: No components found with this basename: POCBNP,  CBG:  Recent Labs Lab 06/03/13 1722 06/03/13 2203 06/04/13 0726 06/04/13 1145  GLUCAP 118* 194* 166* 155*   D-Dimer No results found for this  basename: DDIMER,  in the last 72 hours Hgb A1c No results found for this basename: HGBA1C,  in the last 72 hours Lipid Profile No results found for this basename: CHOL, HDL, LDLCALC, TRIG, CHOLHDL, LDLDIRECT,  in the last 72 hours Thyroid function studies No results found for this basename: TSH, T4TOTAL, FREET3, T3FREE, THYROIDAB,  in the last 72 hours Anemia work up No results found for this basename: VITAMINB12, FOLATE, FERRITIN, TIBC, IRON, RETICCTPCT,  in the last 72 hours Microbiology No results found for this or any previous visit (from the past 240 hour(s)).    Studies:  No results found.  Assessment: 80 y.o. [place] man with a diagnosis of primary hepatic non-Hodgkin's lymphoma established through liver biopsy February 2006, treated with Rituxan x9 and then CHOP x6. All chemotherapy completed in 2007. Maintenance Rituxan discontinued October 2008.  (1) biopsy proven  retroperitoneal recurrence documented November 2014; bone marrow biopsy 04/01/2013 negative  (2) on 04/29/2013 rceived the first of two planned cycles of R-ICE chemotherapy with a view to eventual autologous transplant at [facility]  (3) cycle 2 RICE completed on 05/17/2012  (4) PET scan performed on 05/23/2013 revealed marked partial response to chemotherapy with a retroperitoneal mass decrease in metabolic activity from SUV of 13.2 to 6.3. Right adrenal mass size and metabolic activity is resolved when compared to the previous PET scan.  (5) He was [facility] at [facility] by Dr. Tomasa Hosteller for autologuos transplant on 05/27/2013 and was quoted a 3% chance of significant heart damage and possibly death from the treatment and a 50% chance of being alive 5 years from now after transplant (versus perhaps 2 years without). [name] will f/u with Dr. Tomasa Hosteller for transplant after 2 more cycles of the RICE chemotherapy  (6) Started third cycle of RICE chemotherapy on 06/04/2013   Plan: We are proceeding with day 1 cycle 3 of RICE chemotherapy today. He will receive the rituximab and the first dose of etoposide today. We're going to hold off on transfusion for now since he remains asymptomatic, but he will likely need 2 units of packed red cells prior to discharge, with target discharge date being Friday morning 06/07/2013.   He will then be followed as an outpatient next week, with a view to readmission March 2 for his final cycle. At this point the plan is still to proceed to transplant at [facility], although [name] is still considering stopping after the fourth cycle of chemotherapy. The fact that his wife as he is not doing so well certainly is making the decision more difficult for him.   Michael Cruel, MD 06/04/2013  3:30 PM

## 2013-06-04 NOTE — Care Management Note (Signed)
    Page 1 of 1   06/04/2013     11:43:10 AM   CARE MANAGEMENT NOTE 06/04/2013  Patient:  [name], [name]   Account Number:  [id number]  Date Initiated:  06/04/2013  Documentation initiated by:  Sunday Spillers  Subjective/Objective Assessment:   80 yo male admitted for 3rd cycle of R-ICE. Hx of Non Hodgkins lymphoma.     Action/Plan:   Home when stable   Anticipated DC Date:  06/07/2013   Anticipated DC Plan:  HOME/SELF CARE         Choice offered to / List presented to:             Status of service:  Completed, signed off Medicare Important Message given?  NA - LOS <3 / Initial given by admissions (If response is "NO", the following Medicare IM given date fields will be blank) Date Medicare IM given:   Date Additional Medicare IM given:    Discharge Disposition:  HOME/SELF CARE  Per UR Regulation:  Reviewed for med. necessity/level of care/duration of stay  If discussed at [place] of Stay Meetings, dates discussed:    Comments:

## 2013-06-05 ENCOUNTER — Other Ambulatory Visit: Payer: Self-pay | Admitting: Oncology

## 2013-06-05 ENCOUNTER — Telehealth: Payer: Self-pay | Admitting: Oncology

## 2013-06-05 DIAGNOSIS — E119 Type 2 diabetes mellitus without complications: Secondary | ICD-10-CM | POA: Diagnosis not present

## 2013-06-05 DIAGNOSIS — C8583 Other specified types of non-Hodgkin lymphoma, intra-abdominal lymph nodes: Secondary | ICD-10-CM | POA: Diagnosis not present

## 2013-06-05 DIAGNOSIS — C8590 Non-Hodgkin lymphoma, unspecified, unspecified site: Secondary | ICD-10-CM

## 2013-06-05 DIAGNOSIS — H268 Other specified cataract: Secondary | ICD-10-CM | POA: Diagnosis not present

## 2013-06-05 DIAGNOSIS — Z5111 Encounter for antineoplastic chemotherapy: Secondary | ICD-10-CM | POA: Diagnosis not present

## 2013-06-05 LAB — GLUCOSE, CAPILLARY
GLUCOSE-CAPILLARY: 216 mg/dL — AB (ref 70–99)
Glucose-Capillary: 295 mg/dL — ABNORMAL HIGH (ref 70–99)
Glucose-Capillary: 218 mg/dL — ABNORMAL HIGH (ref 70–99)
Glucose-Capillary: 369 mg/dL — ABNORMAL HIGH (ref 70–99)

## 2013-06-05 MED ORDER — SODIUM CHLORIDE 0.9 % IV SOLN
Freq: Once | INTRAVENOUS | Status: AC
  Administered 2013-06-05: 13:00:00 via INTRAVENOUS
  Filled 2013-06-05: qty 205

## 2013-06-05 MED ORDER — ONDANSETRON HCL 4 MG/2ML IJ SOLN
INTRAMUSCULAR | Status: AC
Start: 2013-06-05 — End: 2013-06-06
  Administered 2013-06-05: 20 mg via INTRAVENOUS
  Administered 2013-06-06: 16 mg via INTRAVENOUS
  Filled 2013-06-05 (×2): qty 8

## 2013-06-05 MED ORDER — SODIUM CHLORIDE 0.9 % IV SOLN
452.0000 mg | Freq: Once | INTRAVENOUS | Status: AC
  Administered 2013-06-05: 450 mg via INTRAVENOUS
  Filled 2013-06-05: qty 45

## 2013-06-05 MED ORDER — SODIUM CHLORIDE 0.9 % IV SOLN
100.0000 mg/m2 | INTRAVENOUS | Status: AC
  Administered 2013-06-05 – 2013-06-06 (×2): 210 mg via INTRAVENOUS
  Filled 2013-06-05 (×2): qty 10.5

## 2013-06-05 MED ORDER — PROCHLORPERAZINE MALEATE 5 MG PO TABS
5.0000 mg | ORAL_TABLET | Freq: Three times a day (TID) | ORAL | Status: DC
  Administered 2013-06-05 – 2013-06-07 (×6): 5 mg via ORAL
  Filled 2013-06-05 (×10): qty 1

## 2013-06-05 NOTE — Progress Notes (Signed)
CBG's elevated despite the ordered NPH home doses and correction scale. Pt had been ordered a regular diet;I modified to carbohydrate modified with co-sign required. Thank you, Rosita Kea, RN, CNS, Diabetes Coordinator [phone])

## 2013-06-05 NOTE — Progress Notes (Signed)
[name]   DOB:[birth year]   [id number]   ZQ:[id number]  Subjective: AC did fine yesterday with the rituximab and etoposide-- no port issues, no N/V. Ambulated x2. BM yesterday and this AM, normal. Denies weakness, SOB, CP/P, dizzyness or feeling faint. No family in room   Objective: Middle-aged white male examined on recliner Filed Vitals:   06/05/13 0554  BP: 122/58  Pulse: 72  Temp: 97.8 F (36.6 C)  Resp: 14    Body mass index is 28.52 kg/(m^2).  Intake/Output Summary (Last 24 hours) at 06/05/13 0811 Last data filed at 06/05/13 0400  Gross per 24 hour  Intake 4557.33 ml  Output   1625 ml  Net 2932.33 ml     Sclerae unicteric, pupils equal and round  Oropharynx clear, no thrush  No cervical or [place] adenopathy  Lungs clear -- no rales or rhonchi  Heart regular rate and rhythm  Abdomen soft, nontender, positive bowel sounds,   MSK no peripheral edema  Neuro nonfocal, well oriented, positive affect   CBG (last 3)   Recent Labs  06/04/13 1145 06/04/13 1720 06/04/13 2205  GLUCAP 155* 234* 266*     Labs:  Lab Results  Component Value Date   WBC 9.3 06/04/2013   HGB 7.7* 06/04/2013   HCT 22.5* 06/04/2013   MCV 83.0 06/04/2013   PLT 126* 06/04/2013   NEUTROABS 13.2* 06/03/2013    @LASTCHEMISTRY @  Urine Studies No results found for this basename: UACOL, UAPR, USPG, UPH, UTP, UGL, UKET, UBIL, UHGB, UNIT, UROB, ULEU, UEPI, UWBC, URBC, UBAC, CAST, CRYS, UCOM, BILUA,  in the last 72 hours  Basic Metabolic Panel:  Recent Labs Lab 05/31/13 1115 06/03/13 1440 06/03/13 1700 06/04/13 0615  NA 137 137  --  137  K 3.5 4.0  --  3.7  CL  --   --   --  100  CO2 27 25  --  24  GLUCOSE 253* 185*  --  160*  BUN 12.7 16.2  --  11  CREATININE 1.0 1.0  --  0.74  CALCIUM 8.7 8.9  --  8.2*  MG  --   --  1.2*  --   PHOS  --   --  2.8  --    GFR Estimated Creatinine Clearance: 82.1 ml/min (by C-G formula based on Cr of 0.74). Liver Function Tests:  Recent  Labs Lab 05/31/13 1115 06/03/13 1440 06/04/13 0615  AST 21 23 25   ALT 25 26 24   ALKPHOS 92 74 64  BILITOT 0.27 0.28 0.2*  PROT 6.5 6.6 5.9*  ALBUMIN 3.7 3.7 3.2*   No results found for this basename: LIPASE, AMYLASE,  in the last 168 hours No results found for this basename: AMMONIA,  in the last 168 hours Coagulation profile No results found for this basename: INR, PROTIME,  in the last 168 hours  CBC:  Recent Labs Lab 05/31/13 1115 06/03/13 1439 06/04/13 0615  WBC 21.7* 14.8* 9.3  NEUTROABS 20.5* 13.2*  --   HGB 8.1* 8.6* 7.7*  HCT 24.0* 25.6* 22.5*  MCV 82.8 83.4 83.0  PLT 65* 149 126*   Cardiac Enzymes: No results found for this basename: CKTOTAL, CKMB, CKMBINDEX, TROPONINI,  in the last 168 hours BNP: No components found with this basename: POCBNP,  CBG:  Recent Labs Lab 06/03/13 2203 06/04/13 0726 06/04/13 1145 06/04/13 1720 06/04/13 2205  GLUCAP 194* 166* 155* 234* 266*   D-Dimer No results found for this basename: DDIMER,  in the last  72 hours Hgb A1c No results found for this basename: HGBA1C,  in the last 72 hours Lipid Profile No results found for this basename: CHOL, HDL, LDLCALC, TRIG, CHOLHDL, LDLDIRECT,  in the last 72 hours Thyroid function studies No results found for this basename: TSH, T4TOTAL, FREET3, T3FREE, THYROIDAB,  in the last 72 hours Anemia work up No results found for this basename: VITAMINB12, FOLATE, FERRITIN, TIBC, IRON, RETICCTPCT,  in the last 72 hours Microbiology No results found for this or any previous visit (from the past 240 hour(s)).    Studies:  No results found.  Assessment: 80 y.o. [place] man with a diagnosis of primary hepatic non-Hodgkin's lymphoma established through liver biopsy February 2006, treated with Rituxan x9 and then CHOP x6. All chemotherapy completed in 2007. Maintenance Rituxan discontinued October 2008.  (1) biopsy proven retroperitoneal recurrence documented November 2014; bone marrow  biopsy 04/01/2013 negative  (2) on 04/29/2013 rceived the first of two planned cycles of R-ICE chemotherapy with a view to eventual autologous transplant at [facility]  (3) cycle 2 RICE completed on 05/17/2012  (4) PET scan performed on 05/23/2013 revealed marked partial response to chemotherapy with a retroperitoneal mass decrease in metabolic activity from SUV of 13.2 to 6.3. Right adrenal mass size and metabolic activity is resolved when compared to the previous PET scan.  (5) He was evaluated at [facility] by Dr. Tomasa Hosteller for autologuos transplant on 05/27/2013 and was quoted a 3% chance of significant heart damage and possibly death from the treatment and a 50% chance of being alive 5 years from now after transplant (versus perhaps 2 years without). Mr. Blatz will f/u with Dr. Tomasa Hosteller for transplant after 2 more cycles of the RICE chemotherapy  (6) Started third cycle of RICE chemotherapy on 06/04/2013   Plan: We are proceeding with day 2 cycle 3 of RICE chemotherapy today. Sugars climbing secondary to the steroids. Blurred vision likely due to accomodation issues from same. Anticipate transfusion PRBCs tomorrow after chemotherapy, likely d/c to home Fri AM  He will then be followed as an outpatient next week, with a view to readmission March 2 for his final cycle. At this point the plan is still to proceed to transplant at [facility], although Mr. Ristine is still considering stopping after the fourth cycle of chemotherapy. The fact that his wife as he is not doing so well certainly is making the decision more difficult for him.   [name], MD 06/05/2013  8:11 AM

## 2013-06-05 NOTE — Telephone Encounter (Signed)
, °

## 2013-06-06 DIAGNOSIS — C8583 Other specified types of non-Hodgkin lymphoma, intra-abdominal lymph nodes: Secondary | ICD-10-CM | POA: Diagnosis not present

## 2013-06-06 DIAGNOSIS — H268 Other specified cataract: Secondary | ICD-10-CM | POA: Diagnosis not present

## 2013-06-06 DIAGNOSIS — R0602 Shortness of breath: Secondary | ICD-10-CM

## 2013-06-06 DIAGNOSIS — Z5111 Encounter for antineoplastic chemotherapy: Secondary | ICD-10-CM | POA: Diagnosis not present

## 2013-06-06 DIAGNOSIS — E119 Type 2 diabetes mellitus without complications: Secondary | ICD-10-CM | POA: Diagnosis not present

## 2013-06-06 LAB — CBC WITH DIFFERENTIAL/PLATELET
BASOS PCT: 0 % (ref 0–1)
Basophils Absolute: 0 10*3/uL (ref 0.0–0.1)
EOS ABS: 0 10*3/uL (ref 0.0–0.7)
Eosinophils Relative: 0 % (ref 0–5)
HEMATOCRIT: 24.1 % — AB (ref 39.0–52.0)
HEMOGLOBIN: 8.1 g/dL — AB (ref 13.0–17.0)
Lymphocytes Relative: 2 % — ABNORMAL LOW (ref 12–46)
Lymphs Abs: 0.2 10*3/uL — ABNORMAL LOW (ref 0.7–4.0)
MCH: 28.1 pg (ref 26.0–34.0)
MCHC: 33.6 g/dL (ref 30.0–36.0)
MCV: 83.7 fL (ref 78.0–100.0)
MONO ABS: 0.8 10*3/uL (ref 0.1–1.0)
MONOS PCT: 6 % (ref 3–12)
Neutro Abs: 11.1 10*3/uL — ABNORMAL HIGH (ref 1.7–7.7)
Neutrophils Relative %: 92 % — ABNORMAL HIGH (ref 43–77)
Platelets: 171 10*3/uL (ref 150–400)
RBC: 2.88 MIL/uL — ABNORMAL LOW (ref 4.22–5.81)
RDW: 18.4 % — ABNORMAL HIGH (ref 11.5–15.5)
WBC: 12.2 10*3/uL — ABNORMAL HIGH (ref 4.0–10.5)

## 2013-06-06 LAB — COMPREHENSIVE METABOLIC PANEL
ALT: 39 U/L (ref 0–53)
AST: 35 U/L (ref 0–37)
Albumin: 3.3 g/dL — ABNORMAL LOW (ref 3.5–5.2)
Alkaline Phosphatase: 60 U/L (ref 39–117)
BILIRUBIN TOTAL: 0.4 mg/dL (ref 0.3–1.2)
BUN: 21 mg/dL (ref 6–23)
CO2: 23 mEq/L (ref 19–32)
Calcium: 8.3 mg/dL — ABNORMAL LOW (ref 8.4–10.5)
Chloride: 102 mEq/L (ref 96–112)
Creatinine, Ser: 0.9 mg/dL (ref 0.50–1.35)
GFR calc Af Amer: >90 mL/min (ref >90)
GFR calc non Af Amer: 79 mL/min — ABNORMAL LOW (ref >90)
GLUCOSE: 113 mg/dL — AB (ref 70–99)
POTASSIUM: 4 meq/L (ref 3.7–5.3)
Sodium: 139 mEq/L (ref 137–147)
Total Protein: 5.9 g/dL — ABNORMAL LOW (ref 6.0–8.3)

## 2013-06-06 LAB — GLUCOSE, CAPILLARY
GLUCOSE-CAPILLARY: 98 mg/dL (ref 70–99)
GLUCOSE-CAPILLARY: 207 mg/dL — AB (ref 70–99)
Glucose-Capillary: 112 mg/dL — ABNORMAL HIGH (ref 70–99)
Glucose-Capillary: 169 mg/dL — ABNORMAL HIGH (ref 70–99)

## 2013-06-06 LAB — PREPARE RBC (CROSSMATCH)

## 2013-06-06 LAB — MAGNESIUM: Magnesium: 1.5 mg/dL (ref 1.5–2.5)

## 2013-06-06 LAB — ABO/RH: ABO/RH(D): A POS

## 2013-06-06 MED ORDER — ACETAMINOPHEN 325 MG PO TABS
650.0000 mg | ORAL_TABLET | Freq: Once | ORAL | Status: AC
  Administered 2013-06-06: 650 mg via ORAL
  Filled 2013-06-06: qty 2

## 2013-06-06 MED ORDER — HEPARIN SOD (PORK) LOCK FLUSH 100 UNIT/ML IV SOLN: 250.0000 [IU] | INTRAVENOUS | Status: DC | PRN

## 2013-06-06 MED ORDER — HEPARIN SOD (PORK) LOCK FLUSH 100 UNIT/ML IV SOLN: 500.0000 [IU] | Freq: Every day | INTRAVENOUS | Status: DC | PRN

## 2013-06-06 MED ORDER — SODIUM CHLORIDE 0.9 % IJ SOLN: 3.0000 mL | INTRAMUSCULAR | Status: DC | PRN

## 2013-06-06 MED ORDER — SODIUM CHLORIDE 0.9 % IV SOLN: 250.0000 mL | Freq: Once | INTRAVENOUS | Status: DC

## 2013-06-06 MED ORDER — SODIUM CHLORIDE 0.9 % IJ SOLN: 10.0000 mL | INTRAMUSCULAR | Status: DC | PRN

## 2013-06-06 MED ORDER — DIPHENHYDRAMINE HCL 25 MG PO CAPS
25.0000 mg | ORAL_CAPSULE | Freq: Once | ORAL | Status: AC
  Administered 2013-06-06: 25 mg via ORAL
  Filled 2013-06-06: qty 1

## 2013-06-06 NOTE — Progress Notes (Addendum)
Came to visit patient at bedside to offer [facility] services. States he has had DM for years and  manages his DM fine at home. Reports he has a daughter that helps him to keep everything in order. Declines [facility] services at this time. Left brochure at bedside for him to call in future in case he changes his mind. Appreciative of the visit. Will make inpatient RNCM aware of visit.  Marthenia Rolling, MSN, RN,BSN- [facility] [phone]

## 2013-06-06 NOTE — Progress Notes (Signed)
Inpatient Diabetes Program Recommendations  AACE/ADA: New Consensus Statement on Inpatient Glycemic Control (2013)  Target Ranges:  Prepandial:   less than 140 mg/dL      Peak postprandial:   less than 180 mg/dL (1-2 hours)      Critically ill patients:  140 - 180 mg/dL   Glucose has risen significantly since addition of Decadron q24 hrs. Prior to that, glucose was in fairly good control.  Please consider increase in the NPH doses to 40 units am and 45 units at HS, a total increase by 10 units while on Decadron.    Thank you, Rosita Kea, RN, CNS, Diabetes Coordinator [phone])

## 2013-06-06 NOTE — Progress Notes (Signed)
[name]   DOB:[birth year]   UL#:[id number]   HOZ#:[id number]  Subjective: AC tolerated the day 2 treatment remarkably well-- no N/V, some SOB when ambulating-- no port issues. Normal BM again today.No CNS Sx. No family in room   Objective: Middle-aged white male examined on recliner Filed Vitals:   06/06/13 0540  BP: 114/56  Pulse: 62  Temp: 97.6 F (36.4 C)  Resp: 16    Body mass index is 28.52 kg/(m^2).  Intake/Output Summary (Last 24 hours) at 06/06/13 0758 Last data filed at 06/06/13 0535  Gross per 24 hour  Intake 5993.4 ml  Output   5550 ml  Net  443.4 ml     Sclerae unicteric, pupils equal and round  Oropharynx clear, no thrush  No cervical or [place] adenopathy  Lungs clear -- no rales or rhonchi  Heart regular rate and rhythm  Abdomen soft, nontender, positive bowel sounds,   MSK no peripheral edema  Neuro nonfocal, well oriented, positive affect   CBG (last 3)   Recent Labs  06/05/13 1716 06/05/13 2206 06/06/13 0729  GLUCAP 369* 216* 98     Labs:  Lab Results  Component Value Date   WBC 12.2* 06/06/2013   HGB 8.1* 06/06/2013   HCT 24.1* 06/06/2013   MCV 83.7 06/06/2013   PLT 171 06/06/2013   NEUTROABS 11.1* 06/06/2013    @LASTCHEMISTRY @  Urine Studies No results found for this basename: UACOL, UAPR, USPG, UPH, UTP, UGL, UKET, UBIL, UHGB, UNIT, UROB, ULEU, UEPI, UWBC, URBC, UBAC, CAST, CRYS, UCOM, BILUA,  in the last 72 hours  Basic Metabolic Panel:  Recent Labs Lab 05/31/13 1115 06/03/13 1440 06/03/13 1700 06/04/13 0615 06/06/13 0439  NA 137 137  --  137 139  K 3.5 4.0  --  3.7 4.0  CL  --   --   --  100 102  CO2 27 25  --  24 23  GLUCOSE 253* 185*  --  160* 113*  BUN 12.7 16.2  --  11 21  CREATININE 1.0 1.0  --  0.74 0.90  CALCIUM 8.7 8.9  --  8.2* 8.3*  MG  --   --  1.2*  --  1.5  PHOS  --   --  2.8  --   --    GFR Estimated Creatinine Clearance: 73 ml/min (by C-G formula based on Cr of 0.9). Liver Function Tests:  Recent  Labs Lab 05/31/13 1115 06/03/13 1440 06/04/13 0615 06/06/13 0439  AST 21 23 25  35  ALT 25 26 24  39  ALKPHOS 92 74 64 60  BILITOT 0.27 0.28 0.2* 0.4  PROT 6.5 6.6 5.9* 5.9*  ALBUMIN 3.7 3.7 3.2* 3.3*   No results found for this basename: LIPASE, AMYLASE,  in the last 168 hours No results found for this basename: AMMONIA,  in the last 168 hours Coagulation profile No results found for this basename: INR, PROTIME,  in the last 168 hours  CBC:  Recent Labs Lab 05/31/13 1115 06/03/13 1439 06/04/13 0615 06/06/13 0439  WBC 21.7* 14.8* 9.3 12.2*  NEUTROABS 20.5* 13.2*  --  11.1*  HGB 8.1* 8.6* 7.7* 8.1*  HCT 24.0* 25.6* 22.5* 24.1*  MCV 82.8 83.4 83.0 83.7  PLT 65* 149 126* 171   Cardiac Enzymes: No results found for this basename: CKTOTAL, CKMB, CKMBINDEX, TROPONINI,  in the last 168 hours BNP: No components found with this basename: POCBNP,  CBG:  Recent Labs Lab 06/05/13 0755 06/05/13 1202 06/05/13 1716 06/05/13  2206 06/06/13 0729  GLUCAP 295* 218* 369* 216* 98   D-Dimer No results found for this basename: DDIMER,  in the last 72 hours Hgb A1c No results found for this basename: HGBA1C,  in the last 72 hours Lipid Profile No results found for this basename: CHOL, HDL, LDLCALC, TRIG, CHOLHDL, LDLDIRECT,  in the last 72 hours Thyroid function studies No results found for this basename: TSH, T4TOTAL, FREET3, T3FREE, THYROIDAB,  in the last 72 hours Anemia work up No results found for this basename: VITAMINB12, FOLATE, FERRITIN, TIBC, IRON, RETICCTPCT,  in the last 72 hours Microbiology No results found for this or any previous visit (from the past 240 hour(s)).    Studies:  No results found.  Assessment: 80 y.o. [place] man with a diagnosis of primary hepatic non-Hodgkin's lymphoma established through liver biopsy February 2006, treated with Rituxan x9 and then CHOP x6. All chemotherapy completed in 2007. Maintenance Rituxan discontinued October 2008.   (1) biopsy proven retroperitoneal recurrence documented November 2014; bone marrow biopsy 04/01/2013 negative  (2) on 04/29/2013 rceived the first of two planned cycles of R-ICE chemotherapy with a view to eventual autologous transplant at [facility]  (3) cycle 2 RICE completed on 05/17/2012  (4) PET scan performed on 05/23/2013 revealed marked partial response to chemotherapy with a retroperitoneal mass decrease in metabolic activity from SUV of 13.2 to 6.3. Right adrenal mass size and metabolic activity is resolved when compared to the previous PET scan.  (5) He was evaluated at [facility] by Dr. Tomasa Hosteller for autologuos transplant on 05/27/2013 and was quoted a 3% chance of significant heart damage and possibly death from the treatment and a 50% chance of being alive 5 years from now after transplant (versus perhaps 2 years without). [name] will f/u with Dr. Tomasa Hosteller for transplant after 2 more cycles of the RICE chemotherapy  (6) Started third cycle of RICE chemotherapy on 06/04/2013 (7) anemia due to chemotherapy   Plan: We are proceeding with day 3 cycle 3 of RICE chemotherapy today. Will transfuse PRBCs once chemo completed. Anticipate dc tomorrow AM if all continues well. He is set up for neulasta 2/14  He will then be followed as an outpatient next week, with a view to readmission March 2 for his final cycle. At this point the plan is still to proceed to transplant at [facility], although [name] is still considering stopping after the fourth cycle of chemotherapy. The fact that his wife as he is not doing so well certainly is making the decision more difficult for him.   [name], MD 06/06/2013  7:58 AM

## 2013-06-07 ENCOUNTER — Encounter (HOSPITAL_COMMUNITY): Payer: Self-pay | Admitting: Oncology

## 2013-06-07 DIAGNOSIS — T451X5A Adverse effect of antineoplastic and immunosuppressive drugs, initial encounter: Secondary | ICD-10-CM

## 2013-06-07 DIAGNOSIS — D6481 Anemia due to antineoplastic chemotherapy: Secondary | ICD-10-CM

## 2013-06-07 DIAGNOSIS — E1110 Type 2 diabetes mellitus with ketoacidosis without coma: Secondary | ICD-10-CM

## 2013-06-07 DIAGNOSIS — Z5111 Encounter for antineoplastic chemotherapy: Secondary | ICD-10-CM | POA: Diagnosis not present

## 2013-06-07 DIAGNOSIS — E119 Type 2 diabetes mellitus without complications: Secondary | ICD-10-CM | POA: Diagnosis not present

## 2013-06-07 DIAGNOSIS — C8589 Other specified types of non-Hodgkin lymphoma, extranodal and solid organ sites: Secondary | ICD-10-CM | POA: Diagnosis not present

## 2013-06-07 DIAGNOSIS — I10 Essential (primary) hypertension: Secondary | ICD-10-CM | POA: Diagnosis not present

## 2013-06-07 LAB — GLUCOSE, CAPILLARY: GLUCOSE-CAPILLARY: 93 mg/dL (ref 70–99)

## 2013-06-07 LAB — TYPE AND SCREEN
ABO/RH(D): A POS
Antibody Screen: NEGATIVE
UNIT DIVISION: 0
Unit division: 0

## 2013-06-07 MED ORDER — DEXAMETHASONE 4 MG PO TABS: 8.0000 mg | ORAL_TABLET | Freq: Two times a day (BID) | ORAL | Status: AC

## 2013-06-07 MED ORDER — PROCHLORPERAZINE MALEATE 5 MG PO TABS: 5.0000 mg | ORAL_TABLET | Freq: Three times a day (TID) | ORAL | Status: DC | PRN

## 2013-06-07 MED ORDER — HEPARIN SOD (PORK) LOCK FLUSH 100 UNIT/ML IV SOLN
500.0000 [IU] | Freq: Once | INTRAVENOUS | Status: DC
  Filled 2013-06-07: qty 5

## 2013-06-07 NOTE — Discharge Summary (Signed)
Physician Discharge Summary  Patient ID: [name] MRN: [id number] DOB/AGE: [birth year] 78 y.o.  Admit date: 06/03/2013 Discharge date: 06/07/2013  Primary Care Physician:  Michael Pel, MD   Discharge Diagnoses:  Non-Hodgkin's Lymphoma, recurrent; DM; HTN; hyperlipidemia; hypomagnesemia; anemia due to chemotherapy  Present on Admission:  . NHL (non-Hodgkin's lymphoma)  Discharge Medications:    Medication List    STOP taking these medications       amoxicillin-clavulanate 875-125 MG per tablet  Commonly known as:  AUGMENTIN      TAKE these medications       allopurinol 300 MG tablet  Commonly known as:  ZYLOPRIM  Take 1 tablet (300 mg total) by mouth daily.     amLODipine 5 MG tablet  Commonly known as:  NORVASC  Take 5 mg by mouth daily.     aspirin 81 MG tablet  Take 81 mg by mouth daily.     atenolol 100 MG tablet  Commonly known as:  TENORMIN  Take 100 mg by mouth at bedtime.     atorvastatin 20 MG tablet  Commonly known as:  LIPITOR  Take 20 mg by mouth at bedtime.     cholecalciferol 1000 UNITS tablet  Commonly known as:  VITAMIN D  Take 1,000 Units by mouth daily.     dexamethasone 4 MG tablet  Commonly known as:  DECADRON  Take 2 tablets (8 mg total) by mouth 2 (two) times daily with a meal.     ezetimibe 10 MG tablet  Commonly known as:  ZETIA  Take 10 mg by mouth at bedtime.     fenofibrate 160 MG tablet  Take 160 mg by mouth at bedtime.     gabapentin 300 MG capsule  Commonly known as:  NEURONTIN  Take 300 mg by mouth 2 (two) times daily.     hydrochlorothiazide 25 MG tablet  Commonly known as:  HYDRODIURIL  Take 25 mg by mouth daily.     insulin NPH Human 100 UNIT/ML injection  Commonly known as:  HUMULIN N,NOVOLIN N  Inject into the skin. 35 units in the morning and 40 units at bedtime.     lisinopril 40 MG tablet  Commonly known as:  PRINIVIL,ZESTRIL  Take 40 mg by mouth daily.     metFORMIN 1000 MG  tablet  Commonly known as:  GLUCOPHAGE  Take 1,000 mg by mouth 2 (two) times daily with a meal.     multivitamin with minerals tablet  Take 1 tablet by mouth daily.     prochlorperazine 5 MG tablet  Commonly known as:  COMPAZINE  Take 1 tablet (5 mg total) by mouth every 8 (eight) hours as needed for nausea or vomiting.     sertraline 100 MG tablet  Commonly known as:  ZOLOFT  Take 100 mg by mouth daily.     VITAMIN B 12 PO  Take 2,500 mg by mouth daily.     vitamin C 500 MG tablet  Commonly known as:  ASCORBIC ACID  Take 500 mg by mouth daily.         Disposition and Follow-up: to [facility] 2/14 at 10 AM for neulasta dose; return 2/20 at 10 AM for follow up and 3/2 at 2:15 PM for readmission  Significant Diagnostic Studies:  No results found.  Discharge Laboratory Values: Basic Metabolic Panel:  Recent Labs Lab 05/31/13 1115 06/03/13 1440 06/03/13 1700 06/04/13 0615 06/06/13 0439  NA 137 137  --  137 139  K 3.5 4.0  --  3.7 4.0  CL  --   --   --  100 102  CO2 27 25  --  24 23  GLUCOSE 253* 185*  --  160* 113*  BUN 12.7 16.2  --  11 21  CREATININE 1.0 1.0  --  0.74 0.90  CALCIUM 8.7 8.9  --  8.2* 8.3*  MG  --   --  1.2*  --  1.5  PHOS  --   --  2.8  --   --    GFR Estimated Creatinine Clearance: 73 ml/min (by C-G formula based on Cr of 0.9). Liver Function Tests:  Recent Labs Lab 05/31/13 1115 06/03/13 1440 06/04/13 0615 06/06/13 0439  AST 21 23 25  35  ALT 25 26 24  39  ALKPHOS 92 74 64 60  BILITOT 0.27 0.28 0.2* 0.4  PROT 6.5 6.6 5.9* 5.9*  ALBUMIN 3.7 3.7 3.2* 3.3*   No results found for this basename: LIPASE, AMYLASE,  in the last 168 hours No results found for this basename: AMMONIA,  in the last 168 hours Coagulation profile No results found for this basename: INR, PROTIME,  in the last 168 hours  CBC:  Recent Labs Lab 05/31/13 1115 06/03/13 1439 06/04/13 0615 06/06/13 0439  WBC 21.7* 14.8* 9.3 12.2*  NEUTROABS  20.5* 13.2*  --  11.1*  HGB 8.1* 8.6* 7.7* 8.1*  HCT 24.0* 25.6* 22.5* 24.1*  MCV 82.8 83.4 83.0 83.7  PLT 65* 149 126* 171   Cardiac Enzymes: No results found for this basename: CKTOTAL, CKMB, CKMBINDEX, TROPONINI,  in the last 168 hours BNP: No components found with this basename: POCBNP,  CBG:  Recent Labs Lab 06/06/13 0729 06/06/13 1128 06/06/13 1653 06/06/13 2141 06/07/13 0701  GLUCAP 98 112* 169* 207* 93   D-Dimer No results found for this basename: DDIMER,  in the last 72 hours Hgb A1c No results found for this basename: HGBA1C,  in the last 72 hours Lipid Profile No results found for this basename: CHOL, HDL, LDLCALC, TRIG, CHOLHDL, LDLDIRECT,  in the last 72 hours Thyroid function studies No results found for this basename: TSH, T4TOTAL, FREET3, T3FREE, THYROIDAB,  in the last 72 hours Anemia work up No results found for this basename: VITAMINB12, FOLATE, FERRITIN, TIBC, IRON, RETICCTPCT,  in the last 72 hours Microbiology No results found for this or any previous visit (from the past 240 hour(s)).   Brief H and P: For complete details please refer to admission H and P, but in brief, the patient was admitted 06/03/2013 for cycle 3 of RICE for his NHL. For further details see Hospital Course.  Physical Exam at Discharge: BP 149/73  Pulse 66  Temp(Src) 97.6 F (36.4 C) (Oral)  Resp 16  Ht 5' 9"  (1.753 m)  Wt 193 lb 3.2 oz (87.635 kg)  BMI 28.52 kg/m2  SpO2 99% Gen: middle aged WM examined in recliner Cardiovascular: RRR, no murmur appreciated Respiratory: no rales or rhonchi Gastrointestinal: soft, NT, +BS Extremities:*no edema, no cyanosis or clubbing    Hospital Course:  Active Problems:   NHL (non-Hodgkin's lymphoma)   DM (diabetes mellitus) type 2, uncontrolled, with ketoacidosis  80 y.o. [name] man with a diagnosis of primary hepatic non-Hodgkin's lymphoma established through liver biopsy February 2006, treated with Rituxan x9 and then CHOP  x6. All chemotherapy completed in 2007. Maintenance Rituxan discontinued October 2008.  (1) biopsy proven retroperitoneal recurrence documented November 2014; bone marrow biopsy 04/01/2013  negative  (2) on 04/29/2013 rceived the first of two planned cycles of R-ICE chemotherapy with a view to eventual autologous transplant at [facility]  (3) cycle 2 RICE completed on 05/17/2012  (4) PET scan performed on 05/23/2013 revealed marked partial response to chemotherapy with a retroperitoneal mass decrease in metabolic activity from SUV of 13.2 to 6.3. Right adrenal mass size and metabolic activity is resolved when compared to the previous PET scan.  (5) He was evaluated at [facility] by Dr. Tomasa Hosteller for autologuos transplant on 05/27/2013 and was quoted a 3% chance of significant heart damage and possibly death from the treatment and a 50% chance of being alive 5 years from now after transplant (versus perhaps 2 years without). Mr. Dials will f/u with Dr. Tomasa Hosteller for transplant after 2 more cycles of the RICE chemotherapy  (6) Started third cycle of RICE chemotherapy on 06/04/2013  (7) anemia due to chemotherapy   Mr Slape as admitted 06/03/2013 for his 3d of 4 planned cycles of RICE chemotherapy. His HTN, DM and statins were continued. On day 1 he received etoposide at 100 mg/M2 (total dose 210 mg) IV, repeated days 2 and 3. On day 1 he also received rituximab at 375 mg/M2 (dose 800 mg) IV. Day 2 he received in addition to eoposide carboplatin at an AUC 5 (dose  450 mg) IV and started the ifosfamide infusion at 5g/M2 (dose 19,250), received over 24 h w an equivalent amount of MESNA. The final etoposide dose was given 2/12.-- After completing chemo the patient was transfused 2 units PRBCs for a Hb <8.0 the day before.  Mr Sweetser tolerated the chemotherapy well, with no significant nausea or vomiting, no access problems, no MSChangs, no hematuria or dysuria. He recievied magnesium supplem,entation on day 2. He has  instructions and scripts for his anti emetics and will receive neulasta this weekend, with repeat visit 2/20 and repeat admission 3/2. Patient is aware of these dates and times.  Diet:  Regular/ no concentrated sweets  Activity:  As tolerated  Condition at Discharge: stable    Signed: Dr. Lurline Del [phone]  06/07/2013, 8:18 AM

## 2013-06-08 ENCOUNTER — Ambulatory Visit (HOSPITAL_BASED_OUTPATIENT_CLINIC_OR_DEPARTMENT_OTHER): Payer: Medicare Other

## 2013-06-08 VITALS — BP 101/56 | HR 69 | Temp 98.8°F | Resp 17

## 2013-06-08 DIAGNOSIS — C8590 Non-Hodgkin lymphoma, unspecified, unspecified site: Secondary | ICD-10-CM

## 2013-06-08 DIAGNOSIS — C8589 Other specified types of non-Hodgkin lymphoma, extranodal and solid organ sites: Secondary | ICD-10-CM | POA: Diagnosis not present

## 2013-06-08 MED ORDER — PEGFILGRASTIM INJECTION 6 MG/0.6ML
6.0000 mg | Freq: Once | SUBCUTANEOUS | Status: AC
  Administered 2013-06-08: 6 mg via SUBCUTANEOUS

## 2013-06-10 ENCOUNTER — Telehealth: Payer: Self-pay | Admitting: Oncology

## 2013-06-12 ENCOUNTER — Other Ambulatory Visit (HOSPITAL_BASED_OUTPATIENT_CLINIC_OR_DEPARTMENT_OTHER): Payer: Medicare Other

## 2013-06-12 ENCOUNTER — Ambulatory Visit (HOSPITAL_BASED_OUTPATIENT_CLINIC_OR_DEPARTMENT_OTHER): Payer: Medicare Other

## 2013-06-12 ENCOUNTER — Ambulatory Visit (HOSPITAL_BASED_OUTPATIENT_CLINIC_OR_DEPARTMENT_OTHER): Payer: Medicare Other | Admitting: Hematology and Oncology

## 2013-06-12 ENCOUNTER — Telehealth: Payer: Self-pay | Admitting: Oncology

## 2013-06-12 VITALS — BP 93/57 | HR 66 | Temp 97.6°F | Resp 18 | Ht 69.0 in | Wt 190.9 lb

## 2013-06-12 DIAGNOSIS — C8590 Non-Hodgkin lymphoma, unspecified, unspecified site: Secondary | ICD-10-CM

## 2013-06-12 DIAGNOSIS — D6481 Anemia due to antineoplastic chemotherapy: Secondary | ICD-10-CM | POA: Diagnosis not present

## 2013-06-12 DIAGNOSIS — E860 Dehydration: Secondary | ICD-10-CM | POA: Diagnosis not present

## 2013-06-12 DIAGNOSIS — T451X5A Adverse effect of antineoplastic and immunosuppressive drugs, initial encounter: Secondary | ICD-10-CM

## 2013-06-12 DIAGNOSIS — C8589 Other specified types of non-Hodgkin lymphoma, extranodal and solid organ sites: Secondary | ICD-10-CM | POA: Diagnosis not present

## 2013-06-12 DIAGNOSIS — C8583 Other specified types of non-Hodgkin lymphoma, intra-abdominal lymph nodes: Secondary | ICD-10-CM

## 2013-06-12 DIAGNOSIS — D702 Other drug-induced agranulocytosis: Secondary | ICD-10-CM | POA: Diagnosis not present

## 2013-06-12 DIAGNOSIS — E876 Hypokalemia: Secondary | ICD-10-CM

## 2013-06-12 DIAGNOSIS — D72819 Decreased white blood cell count, unspecified: Secondary | ICD-10-CM | POA: Diagnosis not present

## 2013-06-12 DIAGNOSIS — C8580 Other specified types of non-Hodgkin lymphoma, unspecified site: Secondary | ICD-10-CM

## 2013-06-12 LAB — CBC WITH DIFFERENTIAL/PLATELET
BASO%: 2.3 % — AB (ref 0.0–2.0)
Basophils Absolute: 0 10*3/uL (ref 0.0–0.1)
EOS ABS: 0 10*3/uL (ref 0.0–0.5)
EOS%: 0.8 % (ref 0.0–7.0)
HCT: 27.7 % — ABNORMAL LOW (ref 38.4–49.9)
HGB: 9.2 g/dL — ABNORMAL LOW (ref 13.0–17.1)
LYMPH%: 7.7 % — AB (ref 14.0–49.0)
MCH: 27.9 pg (ref 27.2–33.4)
MCHC: 33.3 g/dL (ref 32.0–36.0)
MCV: 83.9 fL (ref 79.3–98.0)
MONO#: 0.2 10*3/uL (ref 0.1–0.9)
MONO%: 13.7 % (ref 0.0–14.0)
NEUT%: 75.5 % — ABNORMAL HIGH (ref 39.0–75.0)
NEUTROS ABS: 1.2 10*3/uL — AB (ref 1.5–6.5)
PLATELETS: 93 10*3/uL — AB (ref 140–400)
RBC: 3.3 10*6/uL — AB (ref 4.20–5.82)
RDW: 17.5 % — AB (ref 11.0–14.6)
WBC: 1.6 10*3/uL — ABNORMAL LOW (ref 4.0–10.3)
lymph#: 0.1 10*3/uL — ABNORMAL LOW (ref 0.9–3.3)

## 2013-06-12 LAB — COMPREHENSIVE METABOLIC PANEL (CC13)
ALBUMIN: 3.7 g/dL (ref 3.5–5.0)
ALT: 46 U/L (ref 0–55)
AST: 21 U/L (ref 5–34)
Alkaline Phosphatase: 62 U/L (ref 40–150)
Anion Gap: 11 mEq/L (ref 3–11)
BILIRUBIN TOTAL: 0.54 mg/dL (ref 0.20–1.20)
BUN: 42.1 mg/dL — ABNORMAL HIGH (ref 7.0–26.0)
CO2: 20 mEq/L — ABNORMAL LOW (ref 22–29)
Calcium: 9.1 mg/dL (ref 8.4–10.4)
Chloride: 109 mEq/L (ref 98–109)
Creatinine: 2 mg/dL — ABNORMAL HIGH (ref 0.7–1.3)
Glucose: 194 mg/dl — ABNORMAL HIGH (ref 70–140)
POTASSIUM: 3.2 meq/L — AB (ref 3.5–5.1)
Sodium: 139 mEq/L (ref 136–145)
TOTAL PROTEIN: 6.2 g/dL — AB (ref 6.4–8.3)

## 2013-06-12 MED ORDER — SODIUM CHLORIDE 0.9 % IV SOLN
INTRAVENOUS | Status: DC
  Administered 2013-06-12: 15:00:00 via INTRAVENOUS

## 2013-06-12 MED ORDER — SODIUM CHLORIDE 0.9 % IV SOLN
Freq: Once | INTRAVENOUS | Status: AC
  Administered 2013-06-12: 15:00:00 via INTRAVENOUS
  Filled 2013-06-12: qty 1000

## 2013-06-12 MED ORDER — SODIUM CHLORIDE 0.9 % IV SOLN: INTRAVENOUS | Status: DC

## 2013-06-12 NOTE — Telephone Encounter (Signed)
gv dtr appt schedule for feb/march

## 2013-06-12 NOTE — Patient Instructions (Signed)
Dehydration, Adult  Dehydration means your body does not have as much fluid as it needs. Your kidneys, brain, and heart will not work properly without the right amount of fluids and salt.   HOME CARE   Ask your doctor how to replace body fluid losses (rehydrate).   Drink enough fluids to keep your pee (urine) clear or pale yellow.   Drink small amounts of fluids often if you feel sick to your stomach (nauseous) or throw up (vomit).   Eat like you normally do.   Avoid:   Foods or drinks high in sugar.   Bubbly (carbonated) drinks.   Juice.   Very hot or cold fluids.   Drinks with caffeine.   Fatty, greasy foods.   Alcohol.   Tobacco.   Eating too much.   Gelatin desserts.   Wash your hands to avoid spreading germs (bacteria, viruses).   Only take medicine as told by your doctor.   Keep all doctor visits as told.  GET HELP RIGHT AWAY IF:    You cannot drink something without throwing up.   You get worse even with treatment.   Your vomit has blood in it or looks greenish.   Your poop (stool) has blood in it or looks black and tarry.   You have not peed in 6 to 8 hours.   You pee a small amount of very dark pee.   You have a fever.   You pass out (faint).   You have belly (abdominal) pain that gets worse or stays in one spot (localizes).   You have a rash, stiff neck, or bad headache.   You get easily annoyed, sleepy, or are hard to wake up.   You feel weak, dizzy, or very thirsty.  MAKE SURE YOU:    Understand these instructions.   Will watch your condition.   Will get help right away if you are not doing well or get worse.  Document Released: 02/05/2009 Document Revised: 07/04/2011 Document Reviewed: 11/29/2010  [employer] Patient Information 2014 [employer]

## 2013-06-13 ENCOUNTER — Other Ambulatory Visit: Payer: Self-pay | Admitting: Hematology and Oncology

## 2013-06-13 ENCOUNTER — Telehealth: Payer: Self-pay | Admitting: *Deleted

## 2013-06-13 ENCOUNTER — Other Ambulatory Visit (HOSPITAL_BASED_OUTPATIENT_CLINIC_OR_DEPARTMENT_OTHER): Payer: Medicare Other

## 2013-06-13 ENCOUNTER — Ambulatory Visit: Payer: Medicare Other

## 2013-06-13 ENCOUNTER — Other Ambulatory Visit: Payer: Medicare Other

## 2013-06-13 ENCOUNTER — Ambulatory Visit (HOSPITAL_BASED_OUTPATIENT_CLINIC_OR_DEPARTMENT_OTHER): Payer: Medicare Other

## 2013-06-13 VITALS — BP 123/55 | HR 83 | Temp 97.8°F | Resp 18

## 2013-06-13 DIAGNOSIS — C8589 Other specified types of non-Hodgkin lymphoma, extranodal and solid organ sites: Secondary | ICD-10-CM | POA: Diagnosis not present

## 2013-06-13 DIAGNOSIS — E876 Hypokalemia: Secondary | ICD-10-CM

## 2013-06-13 DIAGNOSIS — E860 Dehydration: Secondary | ICD-10-CM

## 2013-06-13 DIAGNOSIS — C8299 Follicular lymphoma, unspecified, extranodal and solid organ sites: Secondary | ICD-10-CM

## 2013-06-13 LAB — CBC WITH DIFFERENTIAL/PLATELET
BASO%: 0.9 % (ref 0.0–2.0)
Basophils Absolute: 0 10*3/uL (ref 0.0–0.1)
EOS%: 0.1 % (ref 0.0–7.0)
Eosinophils Absolute: 0 10*3/uL (ref 0.0–0.5)
HCT: 28.7 % — ABNORMAL LOW (ref 38.4–49.9)
HGB: 9.8 g/dL — ABNORMAL LOW (ref 13.0–17.1)
LYMPH%: 4.9 % — AB (ref 14.0–49.0)
MCH: 28.3 pg (ref 27.2–33.4)
MCHC: 34 g/dL (ref 32.0–36.0)
MCV: 83.3 fL (ref 79.3–98.0)
MONO#: 0.4 10*3/uL (ref 0.1–0.9)
MONO%: 20 % — ABNORMAL HIGH (ref 0.0–14.0)
NEUT#: 1.6 10*3/uL (ref 1.5–6.5)
NEUT%: 74.1 % (ref 39.0–75.0)
PLATELETS: 85 10*3/uL — AB (ref 140–400)
RBC: 3.45 10*6/uL — AB (ref 4.20–5.82)
RDW: 17.2 % — ABNORMAL HIGH (ref 11.0–14.6)
WBC: 2.1 10*3/uL — AB (ref 4.0–10.3)
lymph#: 0.1 10*3/uL — ABNORMAL LOW (ref 0.9–3.3)

## 2013-06-13 LAB — COMPREHENSIVE METABOLIC PANEL (CC13)
ALT: 55 U/L (ref 0–55)
ANION GAP: 12 meq/L — AB (ref 3–11)
AST: 27 U/L (ref 5–34)
Albumin: 4.1 g/dL (ref 3.5–5.0)
Alkaline Phosphatase: 68 U/L (ref 40–150)
BUN: 28.1 mg/dL — ABNORMAL HIGH (ref 7.0–26.0)
CO2: 21 meq/L — AB (ref 22–29)
CREATININE: 1.7 mg/dL — AB (ref 0.7–1.3)
Calcium: 9.2 mg/dL (ref 8.4–10.4)
Chloride: 106 mEq/L (ref 98–109)
GLUCOSE: 321 mg/dL — AB (ref 70–140)
Potassium: 3.1 mEq/L — ABNORMAL LOW (ref 3.5–5.1)
SODIUM: 138 meq/L (ref 136–145)
Total Bilirubin: 0.52 mg/dL (ref 0.20–1.20)
Total Protein: 6.8 g/dL (ref 6.4–8.3)

## 2013-06-13 MED ORDER — SODIUM CHLORIDE 0.9 % IV SOLN
Freq: Once | INTRAVENOUS | Status: AC
  Administered 2013-06-13: 15:00:00 via INTRAVENOUS
  Filled 2013-06-13: qty 250

## 2013-06-13 MED ORDER — SODIUM CHLORIDE 0.9 % IV SOLN: 1500.0000 mL | INTRAVENOUS | Status: AC

## 2013-06-13 MED ORDER — POTASSIUM CHLORIDE CRYS ER 20 MEQ PO TBCR: 20.0000 meq | EXTENDED_RELEASE_TABLET | Freq: Two times a day (BID) | ORAL | Status: DC

## 2013-06-13 MED ORDER — SODIUM CHLORIDE 0.9 % IV SOLN
INTRAVENOUS | Status: DC
  Administered 2013-06-13: 13:00:00 via INTRAVENOUS

## 2013-06-13 MED ORDER — LOPERAMIDE HCL 2 MG PO CAPS: 2.0000 mg | ORAL_CAPSULE | Freq: Four times a day (QID) | ORAL | Status: DC | PRN

## 2013-06-13 MED ORDER — POTASSIUM CHLORIDE 10 MEQ/100ML IV SOLN: 10.0000 meq | INTRAVENOUS | Status: DC

## 2013-06-13 NOTE — Progress Notes (Signed)
ID: [name] OB: 12/29/1931  MR#: [id number]  QZE#:[id number]  PCP: Horatio Pel, MD GYN:   SU: [name] OTHER MD: [name], [name]  CHIEF COMPLAINT:  Patient is here for follow up visit  after 3rd cycle of RICE chemotherapy   HISTORY OF PRESENT ILLNESS:  Dr. [name] original note dated 06/28/2004:  The patient developed right upper quadrant pain last year and he had an ultrasound April 5th which showed some gallstones without evidence of cholecystitis or ductal dilatation.  However, there were multiple lesions in the liver which could not be assessed further.  Accordingly on July 31, 2003, [name], showed multiple liver masses, more than 25, most over 1 cm, the largest being in the inferior right lobe, measuring 4.2 cm. There was also a small mass in the spleen.  CT of the pelvis was unremarkable.   The patient had a biopsy of the liver August 01, 2003.  The report says only that it was a lesion in the right lobe of the liver.  Presumably this was the largest lesion present.  The final pathology [id number] and [id number]) showed only cirrhosis.    The patient has been followed by [name], and a repeat CT scan of the abdomen and pelvis was obtained May 18, 2004.  Many of the liver lesions seen previously had actually decreased in size.  However, [name], [name], a second ultrasound-guided biopsy was performed 06/11/04. This was a lesion deep in the left lobe of the liver and therefore, I would think not the same one previously biopsied which was in the right liver. The pathology this time [id number])  shows a poorly differentiated neuroendocrine carcinoma which was positive for chromogranin A, negative for synaptophysin, thyroid transcription factor, a variety of cytokeratins, [name], alpha-fetoprotein and COX-2."  [name] was subsequently evaluated at [facility] by Dr. Otelia Limes and repeat liver biopsy and review of the earlier biopsy here showed a primary hepatic lymphoma. The patient was treated with R-CHOP as detailed below and achieved a complete response. He received maintenance rituximab until 2008  More recently the patient has been complaining of worsening back pain. It felt "like a kidney stone".  INTERVAL HISTORY: [name] is a 80 years old pleasant male with recurrent NHL is here for followup after third cycle of Rice chemotherapy that he completed on 06/06/2013 in the hospital. He is accompanied by his daughter. He received Neulasta on every 14 2015.  Since that time his wife died he is not able to eat well and drink enough fluids. Today he denies any fever, chills, shortness of breath ,chest pain, palpitation denies any constipation ,blood in the stool ,blood in the urine. He denies any nausea or vomiting. He does complain of fatigue His daughter [name] while in the examination room today noticed a bulge in her father's left flank region that patient and his daughter says it was not  not there before. Patient says that is not painful .  REVIEW OF SYSTEMS: All 14 review of systems has been assessed and pertinent symptoms are mentioned above.  PAST MEDICAL HISTORY: Past Medical History  Diagnosis Date   Hypertension    Cancer of liver    Kidney stone    Prostate cancer    Skin cancer    Arthritis    Anemia    GERD (gastroesophageal reflux disease)    Lymphoma    [name]    Sleep apnea    Neuropathy in diabetes     Hx: of   Diabetes mellitus     INSULIN DEPENDENT   HX, PERSONAL, MALIGNANCY, PROSTATE 07/28/2006    Annotation: 2001, resected  Qualifier: Diagnosis of  By: [name], [name]    Significant for prostate cancer, the patient undergoing prostatectomy January 2001 under [facility] for a Gleason 7, [name], minor coronary artery disease, hypertension, hypertriglyceridemia, cholelithiasis, colon polyps, cirrhosis by biopsy, diabetes, [name], history of nephrolithiasis, status post right renal surgery and history of left rotator cuff repair under [name].    PAST SURGICAL HISTORY: Past Surgical History  Procedure Laterality Date   Prostatectomy     Kidney stone surgery     Rotator cuff repair     Ankle surgery     Cardiac catheterization      Hx: of 1970's   Colonoscopy      Hx: of   Infusion port  04/04/2013    RIGHT SUBCLAVIAN   Cholecystectomy  04/04/2013   Cholecystectomy N/A 04/04/2013    Procedure: LAPAROSCOPIC CHOLECYSTECTOMY WITH INTRAOPERATIVE CHOLANGIOGRAM;  [name], MD;  Location: [facility];  Service: General;  [name];   Portacath placement N/A 04/04/2013    Procedure: INSERTION PORT-A-CATH;  [name], MD;  Location: [facility];  Service: General;  [name];    FAMILY HISTORY Family History  Problem Relation Age of Onset   Heart disease Father   The patients father died at the age of 68 from an MI.  The patients mother died from old age at 61.  The patient is one of nine siblings.  One brother died from cancer of the esophagus, 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, [name], a homemaker, is  present today as are the patient's three daughters: [name] is an Scientist, physiological for [employer]; [name], [name]; and [name] is Environmental consultant.  Everybody lives in [place].  The patient has five grandchildren.  He is a member of [place].    ADVANCED DIRECTIVES: in place   HEALTH MAINTENANCE: History  Substance Use Topics   Smoking status: Former Smoker    Types: Cigarettes, Pipe, Cigars   Smokeless tobacco: Never Used     Comment: QUIT SMOKING MANY YEARS AGO "   Alcohol Use: No     Colonoscopy:  PSA: 8.01 (NOV 2014)  Bone density:  Lipid panel:  Allergies  Allergen Reactions   Niacin Other (See Comments)    headaches    Current Outpatient Prescriptions  Medication Sig Dispense Refill   allopurinol (ZYLOPRIM) 300 MG tablet Take  1 tablet (300 mg total) by mouth daily.  30 tablet  3   amLODipine (NORVASC) 5 MG tablet Take 5 mg by mouth daily.       aspirin 81 MG tablet Take 81 mg by mouth daily.       atenolol (TENORMIN) 100 MG tablet Take 100 mg by mouth at bedtime.       atorvastatin (LIPITOR) 20 MG tablet Take 20 mg by mouth at bedtime.       cholecalciferol (VITAMIN D) 1000 UNITS tablet Take 1,000 Units by mouth daily.       Cyanocobalamin (VITAMIN B 12 PO) Take 2,500 mg by mouth daily.        ezetimibe (ZETIA) 10 MG tablet Take 10 mg by mouth at bedtime.       fenofibrate 160 MG tablet Take 160 mg by mouth at bedtime.       gabapentin (NEURONTIN) 300 MG capsule Take 300 mg by mouth 2 (two) times daily.       hydrochlorothiazide (HYDRODIURIL) 25 MG tablet Take 25 mg by mouth daily.       insulin NPH Human (HUMULIN N,NOVOLIN N) 100 UNIT/ML injection Inject into the skin. 35 units in the morning and 40 units at bedtime.       lisinopril (PRINIVIL,ZESTRIL) 40 MG tablet Take 40 mg by mouth daily.       metFORMIN (GLUCOPHAGE) 1000 MG tablet Take  1,000 mg by mouth 2 (two) times daily with a meal.       Multiple Vitamins-Minerals (MULTIVITAMIN WITH MINERALS) tablet Take 1 tablet by mouth daily.       prochlorperazine (COMPAZINE) 5 MG tablet Take 1 tablet (5 mg total) by mouth every 8 (eight) hours as needed for nausea or vomiting.  30 tablet  0   sertraline (ZOLOFT) 100 MG tablet Take 100 mg by mouth daily.       vitamin C (ASCORBIC ACID) 500 MG tablet Take 500 mg by mouth daily.       Current Facility-Administered Medications  Medication Dose Route Frequency Provider Last Rate Last Dose   0.9 %  sodium chloride infusion   Intravenous Continuous Wilmon Arms, MD         Filed Vitals:   06/12/13 1320  BP: 93/57  Pulse: 66  Temp: 97.6 F (36.4 C)  Resp: 18     Body mass index is 28.18 kg/(m^2).      ECOG FS:1 - Symptomatic but completely ambulatory  HEENT PERRLA, sclerae anicteric , pupils equal, round and reactive to light Ear-nose-throat: Oropharynx clear,  no thrush or other lesions  Lymphatic: No cervical or supraclavicular adenopathy; no axillary or inguinal adenopathy  Lungs no rales or rhonchi, good excursion bilaterally Heart regular rate and rhythm, no murmur appreciated Abdomen:  soft, nontender, positive bowel sounds, positive prominent swelling noted in the left flank area,  soft , borders cannot be defined and it is nontender. It is approximately 7cm in size.  MSK no focal spinal tenderness, no joint edema Neuro: non-focal, well-oriented,  positive  affect  LAB RESULTS:  CMP     Component Value Date/Time   NA 139 06/12/2013 1257   NA 139 06/06/2013 0439   K 3.2* 06/12/2013 1257   K 4.0 06/06/2013 0439   CL 102 06/06/2013 0439   CO2 20* 06/12/2013 1257   CO2 23 06/06/2013 0439   GLUCOSE 194* 06/12/2013 1257   GLUCOSE 113* 06/06/2013 0439   BUN 42.1* 06/12/2013 1257   BUN  21 06/06/2013 0439   CREATININE 2.0* 06/12/2013 1257   CREATININE 0.90 06/06/2013 0439   CALCIUM 9.1 06/12/2013 1257   CALCIUM 8.3*  06/06/2013 0439   CALCIUM 11.0* 08/17/2005 0842   PROT 6.2* 06/12/2013 1257   PROT 5.9* 06/06/2013 0439   ALBUMIN 3.7 06/12/2013 1257   ALBUMIN 3.3* 06/06/2013 0439   AST 21 06/12/2013 1257   AST 35 06/06/2013 0439   ALT 46 06/12/2013 1257   ALT 39 06/06/2013 0439   ALKPHOS 62 06/12/2013 1257   ALKPHOS 60 06/06/2013 0439   BILITOT 0.54 06/12/2013 1257   BILITOT 0.4 06/06/2013 0439   GFRNONAA 79* 06/06/2013 0439   GFRAA >90 06/06/2013 0439   Results for [name], KINZLER (MRN [id number]) as of 03/25/2013 18:32  Ref. Range 12/03/2009 08:35 03/29/2010 08:43 08/25/2010 09:31 03/02/2011 08:13 03/20/2013 09:16  LDH Latest Range: 125-245 U/L 135 119 151 128 157   IResults for [name], SEAMANS (MRN [id number]) as of 03/25/2013 18:32  Ref. Range 12/03/2009 08:35 03/29/2010 08:43 08/25/2010 09:31 03/02/2011 08:13 03/20/2013 09:20  Beta-2 Microglobulin Latest Range: 1.01-1.73 mg/L 3.36 (H) 2.85 (H) 2.50 (H) 2.04 (H) 3.00 (H)   No results found for this basename: SPEP,  UPEP,   kappa and lambda light chains    Lab Results  Component Value Date   WBC 1.6* 06/12/2013   NEUTROABS 1.2* 06/12/2013   HGB 9.2* 06/12/2013   HCT 27.7* 06/12/2013   MCV 83.9 06/12/2013   PLT 93* 06/12/2013      Chemistry      Component Value Date/Time   NA 139 06/12/2013 1257   NA 139 06/06/2013 0439   K 3.2* 06/12/2013 1257   K 4.0 06/06/2013 0439   CL 102 06/06/2013 0439   CO2 20* 06/12/2013 1257   CO2 23 06/06/2013 0439   BUN 42.1* 06/12/2013 1257   BUN 21 06/06/2013 0439   CREATININE 2.0* 06/12/2013 1257   CREATININE 0.90 06/06/2013 0439      Component Value Date/Time   CALCIUM 9.1 06/12/2013 1257   CALCIUM 8.3* 06/06/2013 0439   CALCIUM 11.0* 08/17/2005 0842   ALKPHOS 62 06/12/2013 1257   ALKPHOS 60 06/06/2013 0439   AST 21 06/12/2013 1257   AST 35 06/06/2013 0439   ALT 46 06/12/2013 1257   ALT 39 06/06/2013 0439   BILITOT 0.54 06/12/2013 1257   BILITOT 0.4 06/06/2013 0439      Urinalysis    Component Value Date/Time   COLORURINE YELLOW  06/03/2013 2120   APPEARANCEUR CLEAR 06/03/2013 2120   LABSPEC 1.015 06/03/2013 2120   PHURINE 5.5 06/03/2013 2120   GLUCOSEU NEGATIVE 06/03/2013 2120   HGBUR NEGATIVE 06/03/2013 2120   BILIRUBINUR NEGATIVE 06/03/2013 2120   KETONESUR NEGATIVE 06/03/2013 2120   PROTEINUR NEGATIVE 06/03/2013 2120   UROBILINOGEN 0.2 06/03/2013 2120   NITRITE NEGATIVE 06/03/2013 2120   LEUKOCYTESUR NEGATIVE 06/03/2013 2120    STUDIES: Ct Abdomen Pelvis Wo Contrast  03/07/2013   CLINICAL DATA:  Back pain and right flank pain for 1 week. Dysuria. Diarrhea 1 week ago. History of prostatectomy 15 years ago. History of hepatocellular carcinoma with chemotherapy 3 years ago. Diabetes. Skin cancer.  EXAM: CT ABDOMEN AND PELVIS WITHOUT CONTRAST  TECHNIQUE: Multidetector CT imaging of the abdomen and pelvis was performed following the standard protocol without intravenous contrast.  COMPARISON:  10/13/2005  FINDINGS: Lower Chest: Bibasilar scarring or atelectasis. Mild cardiomegaly with dense coronary artery atherosclerosis. No pericardial or pleural effusion.  Abdomen/Pelvis: Cirrhosis. Scattered right hepatic lobe  calcifications without well-defined residual or recurrent mass. There is a 6 mm low-density focus adjacent the gallbladder on image 31/ series 2 which is nonspecific but was likely present on the prior exam.  Lateral segment left liver lobe heterogeneous calcifications are at the site of a dominant mass on the prior exam. Likely the sequelae of prior chemoembolization.  Normal spleen, stomach, left adrenal gland. Mild right adrenal nodularity is new and measures 1.4 cm on image 25/ series 2. Twenty-eight HU.  Moderate pancreatic atrophy. Cholelithiasis without acute cholecystitis or biliary ductal dilatation.  Scarred upper pole right kidney. No renal calculi or hydronephrosis. No hydroureter or ureteric calculi.  Soft tissue mass is identified within the periaortic and pericaval space. This is difficult to differentiate from the  surrounding vasculature due to lack of IV contrast. The conglomerate of mass and vessels measures 6.5 x 5.5 cm on image 36/series 2. This continues in the retrocaval/ retroaortic space more inferiorly.  Underline aortic atherosclerosis.  No retrocrural adenopathy.  Scattered colonic diverticula. Normal terminal ileum and appendix. Normal small bowel without abdominal ascites. No evidence of omental or peritoneal disease.  Prior pelvic sidewall node dissection. No pelvic adenopathy. Normal urinary bladder. Prostate surgically absent by report. Bilateral left greater than right fat containing inguinal hernias. No significant free fluid.  Bones/Musculoskeletal: Advanced degenerative disc disease, including at L2-3 and L5-S1. Trace L5-S1 anterolisthesis.  IMPRESSION: 1.  No urinary tract calculi or hydronephrosis. 2. Soft tissue mass within the periaortic/ pericaval space of the retroperitoneum. Suspicious for lymphoma. Metastatic disease from one of the patient's primaries is felt much less likely. Consider nonemergent PET to direct tissue sampling. These results were called by telephone at the time of interpretation on 03/07/2013 at 11:02 AM to Dr. Katheren Shams , who verbally acknowledged these results. 3. Cholelithiasis. 4. Cirrhosis with treatment changes in the liver. No convincing evidence of recurrent or residual metastasis. 5. Right adrenal nodule which is indeterminate and new since 10/13/2005. This could also be evaluated at PET.   Electronically Signed   By: Abigail Miyamoto M.D.   On: 03/07/2013 11:03   Ct Foot Right Wo Contrast  03/04/2013   CLINICAL DATA:  Right foot pain and swelling for 7-10 days. History of Charcot change.  EXAM: CT OF THE RIGHT FOOT WITHOUT CONTRAST  TECHNIQUE: Multidetector CT imaging was performed according to the standard protocol. Multiplanar CT image reconstructions were also generated.  COMPARISON:  None.  FINDINGS: No fracture or dislocation is identified. There is extensive  joint space narrowing with subchondral cyst formation and osteophytosis throughout the midfoot. Scattered small bony fragments are also identified and best seen about the 2nd, 3rd and 4th tarsometatarsal joints. No bony destructive change to suggest osteomyelitis is present. No foreign body or soft tissue gas collection is seen. No fluid collection is identified. Extensive atherosclerotic vascular disease is noted.  IMPRESSION: No acute or focal abnormality.  Findings in the midfoot consistent with advanced osteoarthritis and mild to moderate neuropathic change are identified as described above.  Atherosclerosis.   Electronically Signed   By: Inge Rise M.D.   On: 03/04/2013 15:49   Ct Biopsy  03/19/2013   CLINICAL DATA:  80 year old male with a history of a both a pancreatic adenocarcinoma, and lymphoma now with an enlarging hypermetabolic (PET positive) para aortic retroperitoneal mass. CT-guided biopsy is requested to facilitate tissue diagnosis.  EXAM: CT GUIDED CORE BIOPSY OF PARA AORTIC RETROPERITONEAL MASS  ANESTHESIA/SEDATION: Two mg IV Versed; 100 mcg IV Fentanyl  Total Moderate Sedation Time: 2 minutes.  PROCEDURE: The procedure risks, benefits, and alternatives were explained to the patient. Questions regarding the procedure were encouraged and answered. The patient understands and consents to the procedure.  The right back was prepped with Betadinein a sterile fashion, and a sterile drape was applied covering the operative field. A sterile gown and sterile gloves were used for the procedure. Local anesthesia was provided with 1% Lidocaine.  A planning axial CT scan was performed. The periaortic mass was successfully identified. A suitable skin entry site was selected and marked. Local anesthesia was attained by infiltration with 1% lidocaine. A small dermatotomy was made. Using intermittent CT guidance, a 15 cm 17 gauge trocar needle was carefully advanced through the right back,  retroperitoneal space and into the margin of the periaortic mass. Multiple 18 gauge core biopsies were then coaxially obtained using the 18 gauge BioPince automated biopsy device.  Post biopsy axial CT imaging and demonstrates no evidence of immediate complication. Locules of gas are present within the biopsy site.  Complications: None immediate  FINDINGS: Successful identification of a periaortic retroperitoneal soft tissue mass.  IMPRESSION: Technically successful CT-guided biopsy of periaortic retroperitoneal mass.  Signed,  Criselda Peaches, MD  [facility]  [facility]   Electronically Signed   By: Jacqulynn Cadet M.D.   On: 03/19/2013 15:18   Patient: [name], [name] Collected: 03/19/2013 Client: [facility] Accession: [id number] Received: 03/19/2013 Jacqulynn Cadet, MD DOB: Dec 06, 1931 Age: 81 Gender: M Reported: 03/22/2013 [address] Patient Ph: [phone] MRN #: [id number] [place], [place] [address] Visit #: [id number].[facility] Chart #: Phone:  Fax: CC: [name], MD REPORT OF SURGICAL PATHOLOGY FINAL DIAGNOSIS Diagnosis Retroperitoneal mass, biopsy - DIFFUSE LARGE B-CELL LYMPHOMA, CLINICALLY RECURRENT. - SEE ONCOLOGY TABLE. Microscopic Comment LYMPHOMA Histologic type: Non-Hodgkin lymphoma- diffuse large B-cell lymphoma Grade (if applicable): High grade. Flow cytometry: Not performed. Immunohistochemical stains: CD45, CD20, CD3, CD10, pancytokeratin. Touch preps/imprints: N/A. Comments: The biopsy consists predominately of necrotic tissue. There are a few foci of viable large highly atypical cells with irregular nuclear contours and prominent nucleoli. Immunohistochemistry reveals the cells are positive for CD45, CD20. CD3 highlights a few background T-completely T-cells. Pancytokeratin is negative. CD10 is non-contributory due to high levels of background staining. Overall, the morphologic  and immunophenotypic findings are consistent with a diffuse large B-cell lymphoma. Given the patient's history these findings indicate recurrent disease. Michael Males MD Pathologist, Electronic Signature (Case signed 03/22/2013)    ASSESSMENT/PLAN:  80 y.o. [place] man with a diagnosis of    #1 Primary hepatic non-Hodgkin's lymphoma established through liver biopsy February 2006, treated with Rituxan x9 and then CHOP x6.  All chemotherapy completed in 2007.  Maintenance Rituxan discontinued October 2008.     #2  Biopsy proven retroperitoneal recurrence documented November 2014. Bone marrow biopsy 04/01/2013 was negative   #3 status post  laparoscopic cholecystectomy and port placement on 04/04/2013.  #4 Cycle #1  R-ICE chemotherapy  completed on 05/02/2013.   #5 completed second cycle of RICE-chemotherapy  on 05/17/2013  #6 PET scan performed on 05/23/2013 revealed marked partial response to chemotherapy with a retroperitoneal mass decrease in metabolic activity from SUV of 13.2 to 6.3. Right adrenal mass size and metabolic activity is resolved when compared to the previous PET scan.   #7 He was evaluated at [facility] by Dr. Tomasa Hosteller for autologuos transplant on 05/27/2013 and was quoted a 3% chance of significant heart damage and possibly  death from the treatment and a 50% chance of being alive 5 years from now after transplant (versus perhaps 2 years without). Mr. Theard will f/u with Dr. Tomasa Hosteller for transplant after 4 cycles of the RICE chemotherapy   #8) cycle #3  of RICE chemotherapy completed on  06/06/2013   #9 Dehydration with elevated renal functions secondary to poor po intake: We'll arrange for  1500 ml of normal saline today and tomorrow  #10 Hypokalemia replaced with 20 IV KCl today. Will repeat BMP in a.m.  #11 leukopenia with neutropenia and anemia secondary to chemotherapy-induced: Patient received the Neulasta therapy.  he is afebrile at present  #12 next office  visit on 06/24/2013 with CBC and differential and CMP.  we'll arrange for hospital admission on 06/24/2013 for initiation of fourth cycle of RICE chemotherapy   Patient and his daughter  understood the current plan of care and they  know to call  our office if any questions or concerns  Total time spent: 30 minutes  Wilmon Arms, MD   Medical oncology 06/12/2013

## 2013-06-13 NOTE — Progress Notes (Signed)
Total IVF given today = 1300 cc. No diarrhea while in treatment area today. Made daughter, [name] aware of new script for K-tab and to take Imodium for diarrhea as instructed. Return to clinic on 2/24.

## 2013-06-13 NOTE — Patient Instructions (Signed)
Start Imodium 2-4 mg (OTC) taking four times daily as needed for diarrhea. Go to pharmacy to pick up your script for K-tab (potassium): Take one twice daily for 2 days, then one daily. Return on 06/18/13 for labs and IV fluids.  Dehydration, Adult Dehydration is when you lose more fluids from the body than you take in. Vital organs like the kidneys, brain, and heart cannot function without a proper amount of fluids and salt. Any loss of fluids from the body can cause dehydration.  CAUSES   Vomiting.  Diarrhea.  Excessive sweating.  Excessive urine output.  Fever. SYMPTOMS  Mild dehydration  Thirst.  Dry lips.  Slightly dry mouth. Moderate dehydration  Very dry mouth.  Sunken eyes.  Skin does not bounce back quickly when lightly pinched and released.  Dark urine and decreased urine production.  Decreased tear production.  Headache. Severe dehydration  Very dry mouth.  Extreme thirst.  Rapid, weak pulse (more than 100 beats per minute at rest).  Cold hands and feet.  Not able to sweat in spite of heat and temperature.  Rapid breathing.  Blue lips.  Confusion and lethargy.  Difficulty being awakened.  Minimal urine production.  No tears. DIAGNOSIS  Your caregiver will diagnose dehydration based on your symptoms and your exam. Blood and urine tests will help confirm the diagnosis. The diagnostic evaluation should also identify the cause of dehydration. TREATMENT  Treatment of mild or moderate dehydration can often be done at home by increasing the amount of fluids that you drink. It is best to drink small amounts of fluid more often. Drinking too much at one time can make vomiting worse. Refer to the home care instructions below. Severe dehydration needs to be treated at the hospital where you will probably be given intravenous (IV) fluids that contain water and electrolytes. HOME CARE INSTRUCTIONS   Ask your caregiver about specific rehydration  instructions.  Drink enough fluids to keep your urine clear or pale yellow.  Drink small amounts frequently if you have nausea and vomiting.  Eat as you normally do.  Avoid:  Foods or drinks high in sugar.  Carbonated drinks.  Juice.  Extremely hot or cold fluids.  Drinks with caffeine.  Fatty, greasy foods.  Alcohol.  Tobacco.  Overeating.  Gelatin desserts.  Wash your hands well to avoid spreading bacteria and viruses.  Only take over-the-counter or prescription medicines for pain, discomfort, or fever as directed by your caregiver.  Ask your caregiver if you should continue all prescribed and over-the-counter medicines.  Keep all follow-up appointments with your caregiver. SEEK MEDICAL CARE IF:  You have abdominal pain and it increases or stays in one area (localizes).  You have a rash, stiff neck, or severe headache.  You are irritable, sleepy, or difficult to awaken.  You are weak, dizzy, or extremely thirsty. SEEK IMMEDIATE MEDICAL CARE IF:   You are unable to keep fluids down or you get worse despite treatment.  You have frequent episodes of vomiting or diarrhea.  You have blood or green matter (bile) in your vomit.  You have blood in your stool or your stool looks black and tarry.  You have not urinated in 6 to 8 hours, or you have only urinated a small amount of very dark urine.  You have a fever.  You faint. MAKE SURE YOU:   Understand these instructions.  Will watch your condition.  Will get help right away if you are not doing well or get worse.  Document Released: 04/11/2005 Document Revised: 07/04/2011 Document Reviewed: 11/29/2010 [facility] Patient Information 2014 [place], [place].

## 2013-06-13 NOTE — Telephone Encounter (Signed)
Per staff message and POF I have scheduled appts.  JMW  

## 2013-06-13 NOTE — Progress Notes (Deleted)
Please refer to Hospital admission Note

## 2013-06-14 ENCOUNTER — Ambulatory Visit: Payer: Medicare Other

## 2013-06-14 ENCOUNTER — Other Ambulatory Visit: Payer: Medicare Other

## 2013-06-17 ENCOUNTER — Other Ambulatory Visit (HOSPITAL_BASED_OUTPATIENT_CLINIC_OR_DEPARTMENT_OTHER): Payer: Medicare Other

## 2013-06-17 ENCOUNTER — Other Ambulatory Visit: Payer: Self-pay | Admitting: Hematology and Oncology

## 2013-06-17 ENCOUNTER — Ambulatory Visit: Payer: Medicare Other

## 2013-06-17 ENCOUNTER — Encounter: Payer: Self-pay | Admitting: Hematology and Oncology

## 2013-06-17 ENCOUNTER — Telehealth: Payer: Self-pay | Admitting: *Deleted

## 2013-06-17 ENCOUNTER — Ambulatory Visit (HOSPITAL_BASED_OUTPATIENT_CLINIC_OR_DEPARTMENT_OTHER): Payer: Medicare Other | Admitting: Hematology and Oncology

## 2013-06-17 ENCOUNTER — Ambulatory Visit (HOSPITAL_BASED_OUTPATIENT_CLINIC_OR_DEPARTMENT_OTHER): Payer: Medicare Other

## 2013-06-17 ENCOUNTER — Other Ambulatory Visit: Payer: Self-pay | Admitting: *Deleted

## 2013-06-17 ENCOUNTER — Other Ambulatory Visit: Payer: Medicare Other

## 2013-06-17 VITALS — BP 149/70 | HR 83 | Temp 98.7°F | Resp 18 | Ht 69.0 in | Wt 191.6 lb

## 2013-06-17 DIAGNOSIS — J329 Chronic sinusitis, unspecified: Secondary | ICD-10-CM | POA: Diagnosis not present

## 2013-06-17 DIAGNOSIS — R519 Headache, unspecified: Secondary | ICD-10-CM | POA: Insufficient documentation

## 2013-06-17 DIAGNOSIS — Z8546 Personal history of malignant neoplasm of prostate: Secondary | ICD-10-CM

## 2013-06-17 DIAGNOSIS — C8589 Other specified types of non-Hodgkin lymphoma, extranodal and solid organ sites: Secondary | ICD-10-CM | POA: Diagnosis not present

## 2013-06-17 DIAGNOSIS — C8590 Non-Hodgkin lymphoma, unspecified, unspecified site: Secondary | ICD-10-CM

## 2013-06-17 DIAGNOSIS — K1230 Oral mucositis (ulcerative), unspecified: Secondary | ICD-10-CM

## 2013-06-17 DIAGNOSIS — R51 Headache: Secondary | ICD-10-CM

## 2013-06-17 DIAGNOSIS — E876 Hypokalemia: Secondary | ICD-10-CM

## 2013-06-17 DIAGNOSIS — D72829 Elevated white blood cell count, unspecified: Secondary | ICD-10-CM | POA: Diagnosis not present

## 2013-06-17 DIAGNOSIS — D6959 Other secondary thrombocytopenia: Secondary | ICD-10-CM | POA: Diagnosis not present

## 2013-06-17 DIAGNOSIS — J3489 Other specified disorders of nose and nasal sinuses: Secondary | ICD-10-CM

## 2013-06-17 DIAGNOSIS — E860 Dehydration: Secondary | ICD-10-CM

## 2013-06-17 DIAGNOSIS — C8583 Other specified types of non-Hodgkin lymphoma, intra-abdominal lymph nodes: Secondary | ICD-10-CM

## 2013-06-17 DIAGNOSIS — K121 Other forms of stomatitis: Secondary | ICD-10-CM

## 2013-06-17 LAB — COMPREHENSIVE METABOLIC PANEL (CC13)
ALT: 35 U/L (ref 0–55)
ANION GAP: 13 meq/L — AB (ref 3–11)
AST: 24 U/L (ref 5–34)
Albumin: 3.6 g/dL (ref 3.5–5.0)
Alkaline Phosphatase: 83 U/L (ref 40–150)
BUN: 17 mg/dL (ref 7.0–26.0)
CALCIUM: 8.3 mg/dL — AB (ref 8.4–10.4)
CHLORIDE: 101 meq/L (ref 98–109)
CO2: 25 mEq/L (ref 22–29)
Creatinine: 1.2 mg/dL (ref 0.7–1.3)
GLUCOSE: 233 mg/dL — AB (ref 70–140)
Potassium: 2.7 mEq/L — CL (ref 3.5–5.1)
SODIUM: 138 meq/L (ref 136–145)
Total Bilirubin: 0.48 mg/dL (ref 0.20–1.20)
Total Protein: 6.3 g/dL — ABNORMAL LOW (ref 6.4–8.3)

## 2013-06-17 LAB — CBC WITH DIFFERENTIAL/PLATELET
BASO%: 1.2 % (ref 0.0–2.0)
Basophils Absolute: 0.2 10*3/uL — ABNORMAL HIGH (ref 0.0–0.1)
EOS%: 0.1 % (ref 0.0–7.0)
Eosinophils Absolute: 0 10*3/uL (ref 0.0–0.5)
HCT: 26.7 % — ABNORMAL LOW (ref 38.4–49.9)
HGB: 9.1 g/dL — ABNORMAL LOW (ref 13.0–17.1)
LYMPH%: 1.4 % — ABNORMAL LOW (ref 14.0–49.0)
MCH: 28.4 pg (ref 27.2–33.4)
MCHC: 33.9 g/dL (ref 32.0–36.0)
MCV: 83.6 fL (ref 79.3–98.0)
MONO#: 0.6 10*3/uL (ref 0.1–0.9)
MONO%: 2.9 % (ref 0.0–14.0)
NEUT#: 19.6 10*3/uL — ABNORMAL HIGH (ref 1.5–6.5)
NEUT%: 94.4 % — ABNORMAL HIGH (ref 39.0–75.0)
Platelets: 45 10*3/uL — ABNORMAL LOW (ref 140–400)
RBC: 3.19 10*6/uL — AB (ref 4.20–5.82)
RDW: 17.1 % — ABNORMAL HIGH (ref 11.0–14.6)
WBC: 20.7 10*3/uL — AB (ref 4.0–10.3)
lymph#: 0.3 10*3/uL — ABNORMAL LOW (ref 0.9–3.3)

## 2013-06-17 LAB — URIC ACID (CC13): Uric Acid, Serum: 2.3 mg/dl — ABNORMAL LOW (ref 2.6–7.4)

## 2013-06-17 LAB — MAGNESIUM (CC13): MAGNESIUM: 1 mg/dL — AB (ref 1.5–2.5)

## 2013-06-17 MED ORDER — CEFTRIAXONE SODIUM 2 G IJ SOLR
2.0000 g | Freq: Once | INTRAMUSCULAR | Status: AC
  Administered 2013-06-17: 2 g via INTRAVENOUS
  Filled 2013-06-17: qty 2

## 2013-06-17 MED ORDER — MAGNESIUM SULFATE 50 % IJ SOLN
2.0000 g | Freq: Once | INTRAMUSCULAR | Status: DC
  Filled 2013-06-17: qty 4

## 2013-06-17 MED ORDER — SODIUM CHLORIDE 0.9 % IV SOLN
Freq: Once | INTRAVENOUS | Status: DC
  Filled 2013-06-17: qty 1000

## 2013-06-17 MED ORDER — AMOXICILLIN-POT CLAVULANATE 875-125 MG PO TABS: 1.0000 | ORAL_TABLET | Freq: Two times a day (BID) | ORAL | Status: DC

## 2013-06-17 MED ORDER — MAGNESIUM OXIDE 400 (241.3 MG) MG PO TABS: 400.0000 mg | ORAL_TABLET | Freq: Two times a day (BID) | ORAL | Status: AC

## 2013-06-17 MED ORDER — SODIUM CHLORIDE 0.9 % IV SOLN
Freq: Once | INTRAVENOUS | Status: AC
  Administered 2013-06-17: 16:00:00 via INTRAVENOUS
  Filled 2013-06-17: qty 1000

## 2013-06-17 NOTE — Telephone Encounter (Signed)
[name] calling in, patient has mouth sores, also seeing sores in nose. Weak, chemo was last week, not eating or drinking as well. Buried Mrs Neiswonger Saturday. Discussed with Dr Jana Hakim, we will see patient and have set for IVFs today if needed once we further assess. Orders to scheduling. Called patient daughter back, left detailed message to be here today at 1pm.

## 2013-06-17 NOTE — Patient Instructions (Signed)
Ceftriaxone injection (Rocephin--antibiotic) What is this medicine? CEFTRIAXONE (sef try AX one) is a cephalosporin antibiotic. It is used to treat certain kinds of bacterial infections. It will not work for colds, flu, or other viral infections. This medicine may be used for other purposes; ask your health care provider or pharmacist if you have questions. COMMON BRAND NAME(S): Rocephin What should I tell my health care provider before I take this medicine? They need to know if you have any of these conditions: -any chronic illness -bowel disease, like colitis -both kidney and liver disease -high bilirubin level in newborn patients -an unusual or allergic reaction to ceftriaxone, other cephalosporin or penicillin antibiotics, foods, dyes or preservatives -pregnant or trying to get pregnant -breast-feeding How should I use this medicine? This medicine is injected into a muscle or infused it into a vein. It is usually given in a medical office or clinic. If you are to give this medicine you will be taught how to inject it. Follow instructions carefully. Use your doses at regular intervals. Do not take your medicine more often than directed. Do not skip doses or stop your medicine early even if you feel better. Do not stop taking except on your doctor's advice. Talk to your pediatrician regarding the use of this medicine in children. Special care may be needed. Overdosage: If you think you have taken too much of this medicine contact a poison control center or emergency room at once. NOTE: This medicine is only for you. Do not share this medicine with others. What if I miss a dose? If you miss a dose, take it as soon as you can. If it is almost time for your next dose, take only that dose. Do not take double or extra doses. What may interact with this medicine? Do not take this medicine with any of the following medications: -intravenous calcium This list may not describe all possible  interactions. Give your health care provider a list of all the medicines, herbs, non-prescription drugs, or dietary supplements you use. Also tell them if you smoke, drink alcohol, or use illegal drugs. Some items may interact with your medicine. What should I watch for while using this medicine? Tell your doctor or health care professional if your symptoms do not improve or if they get worse. Do not treat diarrhea with over the counter products. Contact your doctor if you have diarrhea that lasts more than 2 days or if it is severe and watery. If you are being treated for a sexually transmitted disease, avoid sexual contact until you have finished your treatment. Having sex can infect your sexual partner. Calcium may bind to this medicine and cause lung or kidney problems. Avoid calcium products while taking this medicine and for 48 hours after taking the last dose of this medicine. What side effects may I notice from receiving this medicine? Side effects that you should report to your doctor or health care professional as soon as possible: -allergic reactions like skin rash, itching or hives, swelling of the face, lips, or tongue -breathing problems -fever, chills -irregular heartbeat -pain when passing urine -seizures -stomach pain, cramps -unusual bleeding, bruising -unusually weak or tired Side effects that usually do not require medical attention (report to your doctor or health care professional if they continue or are bothersome): -diarrhea -dizzy, drowsy -headache -nausea, vomiting -pain, swelling, irritation where injected -stomach upset -sweating This list may not describe all possible side effects. Call your doctor for medical advice about side effects. You may  report side effects to FDA at 1-800-FDA-1088. Where should I keep my medicine? Keep out of the reach of children. Store at room temperature below 25 degrees C (77 degrees F). Protect from light. Throw away any unused  vials after the expiration date. NOTE: This sheet is a summary. It may not cover all possible information. If you have questions about this medicine, talk to your doctor, pharmacist, or health care provider.  2014, [employer]. (2012-04-23 15:34:57)  Dehydration, Adult Dehydration is when you lose more fluids from the body than you take in. Vital organs like the kidneys, brain, and heart cannot function without a proper amount of fluids and salt. Any loss of fluids from the body can cause dehydration.  CAUSES   Vomiting.  Diarrhea.  Excessive sweating.  Excessive urine output.  Fever. SYMPTOMS  Mild dehydration  Thirst.  Dry lips.  Slightly dry mouth. Moderate dehydration  Very dry mouth.  Sunken eyes.  Skin does not bounce back quickly when lightly pinched and released.  Dark urine and decreased urine production.  Decreased tear production.  Headache. Severe dehydration  Very dry mouth.  Extreme thirst.  Rapid, weak pulse (more than 100 beats per minute at rest).  Cold hands and feet.  Not able to sweat in spite of heat and temperature.  Rapid breathing.  Blue lips.  Confusion and lethargy.  Difficulty being awakened.  Minimal urine production.  No tears. DIAGNOSIS  Your caregiver will diagnose dehydration based on your symptoms and your exam. Blood and urine tests will help confirm the diagnosis. The diagnostic evaluation should also identify the cause of dehydration. TREATMENT  Treatment of mild or moderate dehydration can often be done at home by increasing the amount of fluids that you drink. It is best to drink small amounts of fluid more often. Drinking too much at one time can make vomiting worse. Refer to the home care instructions below. Severe dehydration needs to be treated at the hospital where you will probably be given intravenous (IV) fluids that contain water and electrolytes. HOME CARE INSTRUCTIONS   Ask your caregiver  about specific rehydration instructions.  Drink enough fluids to keep your urine clear or pale yellow.  Drink small amounts frequently if you have nausea and vomiting.  Eat as you normally do.  Avoid:  Foods or drinks high in sugar.  Carbonated drinks.  Juice.  Extremely hot or cold fluids.  Drinks with caffeine.  Fatty, greasy foods.  Alcohol.  Tobacco.  Overeating.  Gelatin desserts.  Wash your hands well to avoid spreading bacteria and viruses.  Only take over-the-counter or prescription medicines for pain, discomfort, or fever as directed by your caregiver.  Ask your caregiver if you should continue all prescribed and over-the-counter medicines.  Keep all follow-up appointments with your caregiver. SEEK MEDICAL CARE IF:  You have abdominal pain and it increases or stays in one area (localizes).  You have a rash, stiff neck, or severe headache.  You are irritable, sleepy, or difficult to awaken.  You are weak, dizzy, or extremely thirsty. SEEK IMMEDIATE MEDICAL CARE IF:   You are unable to keep fluids down or you get worse despite treatment.  You have frequent episodes of vomiting or diarrhea.  You have blood or green matter (bile) in your vomit.  You have blood in your stool or your stool looks black and tarry.  You have not urinated in 6 to 8 hours, or you have only urinated a small amount of very dark  urine.  You have a fever.  You faint. MAKE SURE YOU:   Understand these instructions.  Will watch your condition.  Will get help right away if you are not doing well or get worse. Document Released: 04/11/2005 Document Revised: 07/04/2011 Document Reviewed: 11/29/2010 [facility] Patient Information 2014 [place], [place].

## 2013-06-17 NOTE — Telephone Encounter (Signed)
appts made and printed...td 

## 2013-06-17 NOTE — Patient Instructions (Signed)
Patient to call for temp>100 KCL 20 meq 2 tabl twice daily for 2 days and then 2 tabl once daily Mgoxide 400 mg one tabl twice daily Augmentin 875 mg one tablet twice daily Mouth rinses with baking soda and salt Hold aspirin for 3 days

## 2013-06-17 NOTE — Progress Notes (Addendum)
ID: [name] OB: 1932/01/04  MR#: [id number]  FBP#:[id number]  PCP: Horatio Pel, MD GYN:   SU: [name] OTHER MD: [name], [name]  CHIEF COMPLAINT:  Chief Complaint  Patient presents with   Fatigue   Pain    facial   Dehydration     HISTORY OF PRESENT ILLNESS:  Dr. [name] original note dated 06/28/2004:  The patient developed right upper quadrant pain last year and he had an ultrasound April 5th which showed some gallstones without evidence of cholecystitis or ductal dilatation.  However, there were multiple lesions in the liver which could not be assessed further.  Accordingly on July 31, 2003, [name], showed multiple liver masses, more than 25, most over 1 cm, the largest being in the inferior right lobe, measuring 4.2 cm. There was also a small mass in the spleen.  CT of the pelvis was unremarkable.   The patient had a biopsy of the liver August 01, 2003.  The report says only that it was a lesion in the right lobe of the liver.  Presumably this was the largest lesion present.  The final pathology [id number] and [id number]) showed only cirrhosis.     The patient has been followed by [name], and a repeat CT scan of the abdomen and pelvis was obtained May 18, 2004.  Many of the liver lesions seen previously had actually decreased in size.  However, [name], [name], a second ultrasound-guided biopsy was performed 06/11/04. This was a lesion deep in the left lobe of the liver and therefore, I would think not the same one previously biopsied which was in the right liver. The pathology  this time [id number]) shows a poorly differentiated neuroendocrine carcinoma which was positive for chromogranin A, negative for synaptophysin, thyroid transcription factor, a variety of cytokeratins, [name], alpha-fetoprotein and COX-2."  Mr. Lagos was subsequently evaluated at [facility] by Dr. Otelia Limes and repeat liver biopsy and review of the earlier biopsy here showed a primary hepatic lymphoma. The patient was treated with R-CHOP as detailed below and achieved a complete response. He received maintenance rituximab until 2008  More recently the patient has been complaining of worsening back pain. It felt "like a kidney stone".  INTERVAL HISTORY. History of recurrent NHL completed 3 cycles RICE chemotherapy most recent 06/03/2013 Patient comes in today with complains of not eating due to mouth pain mostly localized to his upper jaw.He reports bilateral sinus facial pain with congestion no significant drainage.He had some nosebleeds.Daughter reports temp 99.9 at home.Patient not currently on antibiotics.He uses as needed mouth rinses with baking soda.  He denies back pain or difficulty urinating.He denies cough.Appetite fair .Denies abdominal pain. Just started taking the KCL presribed previously , had one tablet today.   REVIEW OF SYSTEMS: All 14 review of systems has been assessed and pertinent symptoms are mentioned above.  PAST MEDICAL HISTORY: Past Medical History  Diagnosis Date   Hypertension    Cancer of liver    Kidney stone    Prostate cancer    Skin cancer    Arthritis  Anemia    GERD (gastroesophageal reflux disease)    Lymphoma    [name]    Sleep apnea    Neuropathy in diabetes     Hx: of   Diabetes mellitus     INSULIN DEPENDENT   HX, PERSONAL, MALIGNANCY, PROSTATE 07/28/2006    Annotation: 2001, resected Qualifier: Diagnosis of  By: [name], [name]    Significant for prostate cancer, the patient undergoing prostatectomy January 2001  under [name] for a Gleason 7, [name], minor coronary artery disease, hypertension, hypertriglyceridemia, cholelithiasis, colon polyps, cirrhosis by biopsy, diabetes, [name], history of nephrolithiasis, status post right renal surgery and history of left rotator cuff repair under [name].    PAST SURGICAL HISTORY: Past Surgical History  Procedure Laterality Date   Prostatectomy     Kidney stone surgery     Rotator cuff repair     Ankle surgery     Cardiac catheterization      Hx: of 1970's   Colonoscopy      Hx: of   Infusion port  04/04/2013    RIGHT SUBCLAVIAN   Cholecystectomy  04/04/2013   Cholecystectomy N/A 04/04/2013    Procedure: LAPAROSCOPIC CHOLECYSTECTOMY WITH INTRAOPERATIVE CHOLANGIOGRAM;  [name], MD;  Location: [place];  Service: General;  [name];   Portacath placement N/A 04/04/2013    Procedure: INSERTION PORT-A-CATH;  [name], MD;  Location: [place];  Service: General;  [name];    FAMILY HISTORY Family History  Problem Relation Age of Onset   Heart disease Father   The patients father died at the age of 66 from an MI.  The patients mother died from old age at 60.  The patient is one of nine siblings.  One brother died from cancer of the esophagus, 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, [name], a homemaker, is present today as are the patient's three daughters: [name] is an Scientist, physiological for [employer]; [name], is a bookkeeper for  a [employer]; and [name] is Environmental consultant.  Everybody lives in [place].  The patient has five grandchildren.  He is a member of [place].    ADVANCED DIRECTIVES: in place   HEALTH MAINTENANCE: History  Substance Use Topics   Smoking status: Former Smoker    Types: Cigarettes, Pipe, Cigars   Smokeless tobacco: Never Used     Comment: QUIT SMOKING MANY YEARS AGO "   Alcohol Use: No     Colonoscopy:  PSA: 8.01 (NOV 2014)  Bone density:  Lipid panel:  Allergies  Allergen Reactions   Niacin Other (See Comments)    headaches    No current facility-administered medications for this visit.   No current outpatient prescriptions on file.   Facility-Administered Medications Ordered in Other Visits  Medication Dose Route Frequency Provider Last Rate Last Dose   0.9 % NaCl with KCl 20 mEq/ L  infusion   Intravenous Continuous [name], MD 100 mL/hr at  06/19/13 0625     acetaminophen (TYLENOL) tablet 650 mg  650 mg Oral Q6H PRN [name], MD       Or   acetaminophen (TYLENOL) suppository 650 mg  650 mg Rectal Q6H PRN [name], MD       allopurinol (ZYLOPRIM) tablet 300 mg  300 mg Oral Daily Amada Kingfisher, MD   300 mg at 06/19/13 1016   amLODipine (NORVASC) tablet 5 mg  5 mg Oral Daily Amada Kingfisher, MD   5 mg at 06/19/13 1016   atenolol (TENORMIN) tablet 100 mg  100 mg Oral QHS Amada Kingfisher, MD   100 mg at 06/18/13 2223   atorvastatin (LIPITOR) tablet 20 mg  20 mg Oral QHS Amada Kingfisher, MD   20 mg at 06/18/13 2214   cholecalciferol (VITAMIN D) tablet 1,000 Units  1,000 Units Oral Daily Amada Kingfisher, MD   1,000 Units at 06/19/13 1016   docusate sodium (COLACE) capsule 100 mg  100 mg Oral BID [name], MD   100 mg at 06/19/13 1016   enoxaparin (LOVENOX) injection 40 mg  40 mg Subcutaneous Q24H [name], MD   40 mg at 06/18/13 2214   ezetimibe (ZETIA) tablet 10 mg   10 mg Oral QHS Amada Kingfisher, MD   10 mg at 06/18/13 2214   fenofibrate tablet 160 mg  160 mg Oral QHS Amada Kingfisher, MD   160 mg at 06/18/13 2214   gabapentin (NEURONTIN) capsule 300 mg  300 mg Oral BID Amada Kingfisher, MD   300 mg at 06/19/13 0759   hydrochlorothiazide (HYDRODIURIL) tablet 25 mg  25 mg Oral Daily Amada Kingfisher, MD   25 mg at 06/19/13 1016   insulin aspart (novoLOG) injection 0-15 Units  0-15 Units Subcutaneous TID WC [name], MD   3 Units at 06/19/13 1206   insulin NPH Human (HUMULIN N,NOVOLIN N) injection 35 Units  35 Units Subcutaneous QAC breakfast Amada Kingfisher, MD   35 Units at 06/19/13 0758   insulin NPH Human (HUMULIN N,NOVOLIN N) injection 35 Units  35 Units Subcutaneous QHS Amada Kingfisher, MD   35 Units at 06/18/13 2215   lisinopril (PRINIVIL,ZESTRIL) tablet 40 mg  40 mg Oral Daily Amada Kingfisher, MD   40 mg at 06/19/13 1016   loperamide (IMODIUM) capsule 2-4 mg  2-4 mg Oral QID PRN Amada Kingfisher, MD       magnesium oxide (MAG-OX) tablet 400 mg  400 mg Oral BID Amada Kingfisher, MD   400 mg at 06/19/13 1016   metFORMIN (GLUCOPHAGE) tablet 1,000 mg  1,000 mg Oral BID WC [name], MD   1,000 mg at 06/19/13 0759   morphine 2 MG/ML injection 2 mg  2 mg Intravenous Q4H PRN [name], MD       oxyCODONE (Oxy IR/ROXICODONE) immediate release tablet 5 mg  5 mg Oral Q4H PRN [name], MD   5 mg at 06/19/13 1025   pantoprazole (PROTONIX) EC tablet 40 mg  40 mg Oral Daily [name], MD   40 mg at 06/19/13 1016   potassium chloride SA (K-DUR,KLOR-CON) CR tablet 20 mEq  20 mEq Oral BID Amada Kingfisher, MD   20 mEq at 06/19/13 1015   prochlorperazine (COMPAZINE) tablet 5 mg  5 mg Oral Q8H PRN Amada Kingfisher, MD       sertraline (ZOLOFT) tablet 100 mg  100 mg Oral Daily Amada Kingfisher, MD   100 mg at  06/19/13 1016   vancomycin (VANCOCIN) 1,500 mg in sodium chloride 0.9 % 500 mL IVPB  1,500 mg Intravenous Q24H Leann Trefz Poindexter, RPH   1,500 mg at 06/18/13  2021   vitamin C (ASCORBIC ACID) tablet 500 mg  500 mg Oral Daily Amada Kingfisher, MD   500 mg at 06/19/13 1016     Filed Vitals:   06/17/13 1242  BP: 149/70  Pulse: 83  Temp: 98.7 F (37.1 C)  Resp: 18     Body mass index is 28.28 kg/(m^2).      ECOG FS:1 - Symptomatic but completely ambulatory  HEENT PERRLA, sclerae anicteric , pupils equal, round and reactive to light Ear-nose-throat: Oropharynx clear,  no thrush  No oral  lesions bilateral maxillary sinus tenderness prominent almost entire nose nasal redness with edema Lymphatic: No cervical or supraclavicular adenopathy; no axillary or inguinal adenopathy  Lungs no rales or rhonchi, good excursion bilaterally Heart regular rate and rhythm, no murmur appreciated Abdomen:  soft, nontender, positive bowel sounds, positive prominent swelling left flank area,  soft  nontender approximately 6 cm in size.  MSK no focal spinal tenderness, no joint edema Neuro: non-focal, well-oriented,  positive  affect  LAB RESULTS:  CMP     Component Value Date/Time   NA 138 06/19/2013 0630   NA 138 06/17/2013 1231   K 3.3* 06/19/2013 0630   K 2.7* 06/17/2013 1231   CL 98 06/19/2013 0630   CO2 25 06/19/2013 0630   CO2 25 06/17/2013 1231   GLUCOSE 107* 06/19/2013 0630   GLUCOSE 233* 06/17/2013 1231   BUN 10 06/19/2013 0630   BUN 17.0 06/17/2013 1231   CREATININE 0.88 06/19/2013 0630   CREATININE 1.2 06/17/2013 1231   CALCIUM 8.4 06/19/2013 0630   CALCIUM 8.3* 06/17/2013 1231   CALCIUM 11.0* 08/17/2005 0842   PROT 7.0 06/18/2013 2000   PROT 6.3* 06/17/2013 1231   ALBUMIN 3.6 06/18/2013 2000   ALBUMIN 3.6 06/17/2013 1231   AST 25 06/18/2013 2000   AST 24 06/17/2013 1231   ALT 37 06/18/2013 2000   ALT 35 06/17/2013 1231   ALKPHOS 118* 06/18/2013 2000   ALKPHOS 83 06/17/2013 1231   BILITOT 0.3 06/18/2013 2000   BILITOT 0.48 06/17/2013 1231   GFRNONAA 80* 06/19/2013 0630   GFRAA >90 06/19/2013 0630   Results for EAN, GETTEL (MRN [id number]) as of 03/25/2013  18:32  Ref. Range 12/03/2009 08:35 03/29/2010 08:43 08/25/2010 09:31 03/02/2011 08:13 03/20/2013 09:16  LDH Latest Range: 125-245 U/L 135 119 151 128 157   IResults for ABIGAIL, MARSIGLIA (MRN [id number]) as of 03/25/2013 18:32  Ref. Range 12/03/2009 08:35 03/29/2010 08:43 08/25/2010 09:31 03/02/2011 08:13 03/20/2013 09:20  Beta-2 Microglobulin Latest Range: 1.01-1.73 mg/L 3.36 (H) 2.85 (H) 2.50 (H) 2.04 (H) 3.00 (H)   No results found for this basename: SPEP,  UPEP,   kappa and lambda light chains    Lab Results  Component Value Date   WBC 21.7* 06/19/2013   NEUTROABS 21.9* 06/18/2013   HGB 9.1* 06/19/2013   HCT 26.6* 06/19/2013   MCV 83.1 06/19/2013   PLT 50* 06/19/2013      Chemistry      Component Value Date/Time   NA 138 06/19/2013 0630   NA 138 06/17/2013 1231   K 3.3* 06/19/2013 0630   K 2.7* 06/17/2013 1231   CL 98 06/19/2013 0630   CO2 25 06/19/2013 0630   CO2 25 06/17/2013 1231   BUN 10 06/19/2013 0630  BUN 17.0 06/17/2013 1231   CREATININE 0.88 06/19/2013 0630   CREATININE 1.2 06/17/2013 1231      Component Value Date/Time   CALCIUM 8.4 06/19/2013 0630   CALCIUM 8.3* 06/17/2013 1231   CALCIUM 11.0* 08/17/2005 0842   ALKPHOS 118* 06/18/2013 2000   ALKPHOS 83 06/17/2013 1231   AST 25 06/18/2013 2000   AST 24 06/17/2013 1231   ALT 37 06/18/2013 2000   ALT 35 06/17/2013 1231   BILITOT 0.3 06/18/2013 2000   BILITOT 0.48 06/17/2013 1231      Urinalysis    Component Value Date/Time   COLORURINE YELLOW 06/03/2013 2120   APPEARANCEUR CLEAR 06/03/2013 2120   LABSPEC 1.015 06/03/2013 2120   PHURINE 5.5 06/03/2013 2120   GLUCOSEU NEGATIVE 06/03/2013 2120   HGBUR NEGATIVE 06/03/2013 2120   BILIRUBINUR NEGATIVE 06/03/2013 2120   KETONESUR NEGATIVE 06/03/2013 2120   PROTEINUR NEGATIVE 06/03/2013 2120   UROBILINOGEN 0.2 06/03/2013 2120   NITRITE NEGATIVE 06/03/2013 2120   LEUKOCYTESUR NEGATIVE 06/03/2013 2120    STUDIES: Ct Abdomen Pelvis Wo Contrast  03/07/2013   CLINICAL DATA:  Back pain and right flank pain for  1 week. Dysuria. Diarrhea 1 week ago. History of prostatectomy 15 years ago. History of hepatocellular carcinoma with chemotherapy 3 years ago. Diabetes. Skin cancer.  EXAM: CT ABDOMEN AND PELVIS WITHOUT CONTRAST  TECHNIQUE: Multidetector CT imaging of the abdomen and pelvis was performed following the standard protocol without intravenous contrast.  COMPARISON:  10/13/2005  FINDINGS: Lower Chest: Bibasilar scarring or atelectasis. Mild cardiomegaly with dense coronary artery atherosclerosis. No pericardial or pleural effusion.  Abdomen/Pelvis: Cirrhosis. Scattered right hepatic lobe calcifications without well-defined residual or recurrent mass. There is a 6 mm low-density focus adjacent the gallbladder on image 31/ series 2 which is nonspecific but was likely present on the prior exam.  Lateral segment left liver lobe heterogeneous calcifications are at the site of a dominant mass on the prior exam. Likely the sequelae of prior chemoembolization.  Normal spleen, stomach, left adrenal gland. Mild right adrenal nodularity is new and measures 1.4 cm on image 25/ series 2. Twenty-eight HU.  Moderate pancreatic atrophy. Cholelithiasis without acute cholecystitis or biliary ductal dilatation.  Scarred upper pole right kidney. No renal calculi or hydronephrosis. No hydroureter or ureteric calculi.  Soft tissue mass is identified within the periaortic and pericaval space. This is difficult to differentiate from the surrounding vasculature due to lack of IV contrast. The conglomerate of mass and vessels measures 6.5 x 5.5 cm on image 36/series 2. This continues in the retrocaval/ retroaortic space more inferiorly.  Underline aortic atherosclerosis.  No retrocrural adenopathy.  Scattered colonic diverticula. Normal terminal ileum and appendix. Normal small bowel without abdominal ascites. No evidence of omental or peritoneal disease.  Prior pelvic sidewall node dissection. No pelvic adenopathy. Normal urinary bladder.  Prostate surgically absent by report. Bilateral left greater than right fat containing inguinal hernias. No significant free fluid.  Bones/Musculoskeletal: Advanced degenerative disc disease, including at L2-3 and L5-S1. Trace L5-S1 anterolisthesis.  IMPRESSION: 1.  No urinary tract calculi or hydronephrosis. 2. Soft tissue mass within the periaortic/ pericaval space of the retroperitoneum. Suspicious for lymphoma. Metastatic disease from one of the patient's primaries is felt much less likely. Consider nonemergent PET to direct tissue sampling. These results were called by telephone at the time of interpretation on 03/07/2013 at 11:02 AM to Dr. Katheren Shams , who verbally acknowledged these results. 3. Cholelithiasis. 4. Cirrhosis with treatment changes in the liver. No  convincing evidence of recurrent or residual metastasis. 5. Right adrenal nodule which is indeterminate and new since 10/13/2005. This could also be evaluated at PET.   Electronically Signed   By: Abigail Miyamoto M.D.   On: 03/07/2013 11:03   Ct Foot Right Wo Contrast  03/04/2013   CLINICAL DATA:  Right foot pain and swelling for 7-10 days. History of Charcot change.  EXAM: CT OF THE RIGHT FOOT WITHOUT CONTRAST  TECHNIQUE: Multidetector CT imaging was performed according to the standard protocol. Multiplanar CT image reconstructions were also generated.  COMPARISON:  None.  FINDINGS: No fracture or dislocation is identified. There is extensive joint space narrowing with subchondral cyst formation and osteophytosis throughout the midfoot. Scattered small bony fragments are also identified and best seen about the 2nd, 3rd and 4th tarsometatarsal joints. No bony destructive change to suggest osteomyelitis is present. No foreign body or soft tissue gas collection is seen. No fluid collection is identified. Extensive atherosclerotic vascular disease is noted.  IMPRESSION: No acute or focal abnormality.  Findings in the midfoot consistent with advanced  osteoarthritis and mild to moderate neuropathic change are identified as described above.  Atherosclerosis.   Electronically Signed   By: Inge Rise M.D.   On: 03/04/2013 15:49   Ct Biopsy  03/19/2013   CLINICAL DATA:  80 year old male with a history of a both a pancreatic adenocarcinoma, and lymphoma now with an enlarging hypermetabolic (PET positive) para aortic retroperitoneal mass. CT-guided biopsy is requested to facilitate tissue diagnosis.  EXAM: CT GUIDED CORE BIOPSY OF PARA AORTIC RETROPERITONEAL MASS  ANESTHESIA/SEDATION: Two mg IV Versed; 100 mcg IV Fentanyl  Total Moderate Sedation Time: 2 minutes.  PROCEDURE: The procedure risks, benefits, and alternatives were explained to the patient. Questions regarding the procedure were encouraged and answered. The patient understands and consents to the procedure.  The right back was prepped with Betadinein a sterile fashion, and a sterile drape was applied covering the operative field. A sterile gown and sterile gloves were used for the procedure. Local anesthesia was provided with 1% Lidocaine.  A planning axial CT scan was performed. The periaortic mass was successfully identified. A suitable skin entry site was selected and marked. Local anesthesia was attained by infiltration with 1% lidocaine. A small dermatotomy was made. Using intermittent CT guidance, a 15 cm 17 gauge trocar needle was carefully advanced through the right back, retroperitoneal space and into the margin of the periaortic mass. Multiple 18 gauge core biopsies were then coaxially obtained using the 18 gauge BioPince automated biopsy device.  Post biopsy axial CT imaging and demonstrates no evidence of immediate complication. Locules of gas are present within the biopsy site.  Complications: None immediate  FINDINGS: Successful identification of a periaortic retroperitoneal soft tissue mass.  IMPRESSION: Technically successful CT-guided biopsy of periaortic retroperitoneal mass.   Signed,  Criselda Peaches, MD  [facility]  [facility]   Electronically Signed   By: Jacqulynn Cadet M.D.   On: 03/19/2013 15:18   Patient: PARKE, JANDREAU Collected: 03/19/2013 Client: [place] Accession: [id number] Received: 03/19/2013 Jacqulynn Cadet, MD DOB: 09-02-1931 Age: 15 Gender: M Reported: 03/22/2013 [address] Patient Ph: [phone] MRN #: [id number] [place], [place] [address] Visit #: [id number] Chart #: Phone:  Fax: CC: [name], MD REPORT OF SURGICAL PATHOLOGY FINAL DIAGNOSIS Diagnosis Retroperitoneal mass, biopsy - DIFFUSE LARGE B-CELL LYMPHOMA, CLINICALLY RECURRENT. - SEE ONCOLOGY TABLE. Microscopic Comment LYMPHOMA Histologic type: Non-Hodgkin lymphoma- diffuse large  B-cell lymphoma Grade (if applicable): High grade. Flow cytometry: Not performed. Immunohistochemical stains: CD45, CD20, CD3, CD10, pancytokeratin. Touch preps/imprints: N/A. Comments: The biopsy consists predominately of necrotic tissue. There are a few foci of viable large highly atypical cells with irregular nuclear contours and prominent nucleoli. Immunohistochemistry reveals the cells are positive for CD45, CD20. CD3 highlights a few background T-completely T-cells. Pancytokeratin is negative. CD10 is non-contributory due to high levels of background staining. Overall, the morphologic and immunophenotypic findings are consistent with a diffuse large B-cell lymphoma. Given the patient's history these findings indicate recurrent disease. Vicente Males MD Pathologist, Electronic Signature (Case signed 03/22/2013)    ASSESSMENT/PLAN:  80 y.o. patient with a diagnosis of    #1 Primary hepatic non-Hodgkin's lymphoma established through liver biopsy February 2006, treated with Rituxan x9 and then CHOP x6.  All chemotherapy completed in 2007.  Maintenance Rituxan discontinued October 2008.     #2  Biopsy proven  retroperitoneal recurrence documented November 2014. Bone marrow biopsy 04/01/2013 was negative   #3 status post  laparoscopic cholecystectomy and port placement on 04/04/2013.  #4 Cycle #1  RICE chemotherapy  completed on 05/02/2013.   #5 cycle#2 RICEchemotherapy  on 05/17/2013  #6 PET scan performed on 05/23/2013 revealed marked partial response to chemotherapy with a retroperitoneal mass decrease in metabolic activity from SUV of 13.2 to 6.3. Right adrenal mass size and metabolic activity is resolved when compared to the previous PET scan.   #7 Evaluated at [place]  Dr. Tomasa Hosteller for autologous transplant on 05/27/2013 and was quoted a 3% chance of significant heart damage and possibly death from the treatment and a 50% chance of being alive 5 years from now after transplant (versus perhaps 2 years without). Mr. Ivins will f/u with Dr. Tomasa Hosteller for transplant after 4 cycles of the RICE chemotherapy   #8) cycle #3  RICE chemotherapy completed on  06/06/2013   #9 Dehydration with elevated renal functions and hypokalemia secondary to poor po intake 06/12/13  received IV fluids and prescribed KCL.   Today  Reported temp at home 99.9 on 06/16/13  Now afebrile  Nasal pain/inusitis/bilateral sinus pain and congestion will check nasal MRSA swab,culture, treat with 2 gr Rocephin IV today and oral Augmentin 875 mg one twice daily for 10 days depending on nasal culture report.   Leucocytosis most likely due to Neulasta will monitor CBC on 06/20/13 patient to call for temp>100  Mild oral mucositis continue baking soda rinses,add salt  Hypokalemia K 2.7 level further declined,hypomagnesimia Mg 1.0,dehydration due to poor fluid intake .Will administer 2 gr IV Mg in 500 cc ,1 liter IVF with 20 KCL today Patient to take KCL orally 20 meq 2 tabl twice daily for 2 days then 2 tabl once daily Also to take Mgoxide 400 mg one tabl twice daily Will check CMP,Mg level on 06/20/2013  Thrombocytopenia due to  chemotherapy ,patient to hold aspirin for 3 days due to reported nosebleeds.  Will review labs on 06/20/2013 and patient or family to call for concerns .  06/24/2013  MD follow up cycle #4 RICE if improved.  History of prostate cancer with most recent PSA on record 8.0  Patient and his daughter  understood the current plan of care and they  know to call  our office if any questions or concerns  Total time spent: 30 minutes  Amada Kingfisher, MD   Medical oncology 06/12/2013

## 2013-06-17 NOTE — Telephone Encounter (Signed)
Lm informed the daughter that [facility] would like for her dad to come in on 06/17/13 to have labs@ 12:45pm , ov@ 1:30pm, and tx to follow...td

## 2013-06-18 ENCOUNTER — Other Ambulatory Visit: Payer: Self-pay | Admitting: Hematology and Oncology

## 2013-06-18 ENCOUNTER — Telehealth: Payer: Self-pay | Admitting: *Deleted

## 2013-06-18 ENCOUNTER — Ambulatory Visit: Payer: Medicare Other

## 2013-06-18 ENCOUNTER — Other Ambulatory Visit: Payer: Medicare Other

## 2013-06-18 ENCOUNTER — Inpatient Hospital Stay (HOSPITAL_COMMUNITY)
Admission: AD | Admit: 2013-06-18 | Discharge: 2013-06-20 | DRG: 603 | Disposition: A | Discharge status: Home Health | Payer: Medicare Other | Source: Ambulatory Visit | Attending: Oncology | Admitting: Oncology

## 2013-06-18 ENCOUNTER — Inpatient Hospital Stay (HOSPITAL_COMMUNITY): Payer: Medicare Other

## 2013-06-18 DIAGNOSIS — R6884 Jaw pain: Secondary | ICD-10-CM | POA: Diagnosis present

## 2013-06-18 DIAGNOSIS — L03211 Cellulitis of face: Principal | ICD-10-CM | POA: Diagnosis present

## 2013-06-18 DIAGNOSIS — D696 Thrombocytopenia, unspecified: Secondary | ICD-10-CM

## 2013-06-18 DIAGNOSIS — L0390 Cellulitis, unspecified: Secondary | ICD-10-CM

## 2013-06-18 DIAGNOSIS — Z87891 Personal history of nicotine dependence: Secondary | ICD-10-CM

## 2013-06-18 DIAGNOSIS — C8580 Other specified types of non-Hodgkin lymphoma, unspecified site: Secondary | ICD-10-CM

## 2013-06-18 DIAGNOSIS — Z860100 Personal history of colon polyps, unspecified: Secondary | ICD-10-CM

## 2013-06-18 DIAGNOSIS — L02419 Cutaneous abscess of limb, unspecified: Secondary | ICD-10-CM

## 2013-06-18 DIAGNOSIS — K219 Gastro-esophageal reflux disease without esophagitis: Secondary | ICD-10-CM | POA: Diagnosis present

## 2013-06-18 DIAGNOSIS — R519 Headache, unspecified: Secondary | ICD-10-CM

## 2013-06-18 DIAGNOSIS — I10 Essential (primary) hypertension: Secondary | ICD-10-CM | POA: Diagnosis present

## 2013-06-18 DIAGNOSIS — D899 Disorder involving the immune mechanism, unspecified: Secondary | ICD-10-CM | POA: Diagnosis present

## 2013-06-18 DIAGNOSIS — M129 Arthropathy, unspecified: Secondary | ICD-10-CM | POA: Diagnosis present

## 2013-06-18 DIAGNOSIS — D6481 Anemia due to antineoplastic chemotherapy: Secondary | ICD-10-CM | POA: Diagnosis present

## 2013-06-18 DIAGNOSIS — G4730 Sleep apnea, unspecified: Secondary | ICD-10-CM | POA: Diagnosis present

## 2013-06-18 DIAGNOSIS — C8589 Other specified types of non-Hodgkin lymphoma, extranodal and solid organ sites: Secondary | ICD-10-CM | POA: Diagnosis present

## 2013-06-18 DIAGNOSIS — C8590 Non-Hodgkin lymphoma, unspecified, unspecified site: Secondary | ICD-10-CM

## 2013-06-18 DIAGNOSIS — K8010 Calculus of gallbladder with chronic cholecystitis without obstruction: Secondary | ICD-10-CM

## 2013-06-18 DIAGNOSIS — R7881 Bacteremia: Secondary | ICD-10-CM

## 2013-06-18 DIAGNOSIS — E876 Hypokalemia: Secondary | ICD-10-CM | POA: Diagnosis present

## 2013-06-18 DIAGNOSIS — Z807 Family history of other malignant neoplasms of lymphoid, hematopoietic and related tissues: Secondary | ICD-10-CM

## 2013-06-18 DIAGNOSIS — B9562 Methicillin resistant Staphylococcus aureus infection as the cause of diseases classified elsewhere: Secondary | ICD-10-CM

## 2013-06-18 DIAGNOSIS — M272 Inflammatory conditions of jaws: Secondary | ICD-10-CM

## 2013-06-18 DIAGNOSIS — Z794 Long term (current) use of insulin: Secondary | ICD-10-CM | POA: Diagnosis not present

## 2013-06-18 DIAGNOSIS — I2510 Atherosclerotic heart disease of native coronary artery without angina pectoris: Secondary | ICD-10-CM

## 2013-06-18 DIAGNOSIS — L0201 Cutaneous abscess of face: Secondary | ICD-10-CM | POA: Diagnosis not present

## 2013-06-18 DIAGNOSIS — Z801 Family history of malignant neoplasm of trachea, bronchus and lung: Secondary | ICD-10-CM | POA: Diagnosis not present

## 2013-06-18 DIAGNOSIS — Z85828 Personal history of other malignant neoplasm of skin: Secondary | ICD-10-CM | POA: Diagnosis not present

## 2013-06-18 DIAGNOSIS — K5900 Constipation, unspecified: Secondary | ICD-10-CM | POA: Diagnosis present

## 2013-06-18 DIAGNOSIS — Z800 Family history of malignant neoplasm of digestive organs: Secondary | ICD-10-CM | POA: Diagnosis not present

## 2013-06-18 DIAGNOSIS — T451X5A Adverse effect of antineoplastic and immunosuppressive drugs, initial encounter: Secondary | ICD-10-CM | POA: Diagnosis present

## 2013-06-18 DIAGNOSIS — L03119 Cellulitis of unspecified part of limb: Secondary | ICD-10-CM

## 2013-06-18 DIAGNOSIS — E1110 Type 2 diabetes mellitus with ketoacidosis without coma: Secondary | ICD-10-CM

## 2013-06-18 DIAGNOSIS — E119 Type 2 diabetes mellitus without complications: Secondary | ICD-10-CM

## 2013-06-18 DIAGNOSIS — D6959 Other secondary thrombocytopenia: Secondary | ICD-10-CM | POA: Diagnosis present

## 2013-06-18 DIAGNOSIS — A4902 Methicillin resistant Staphylococcus aureus infection, unspecified site: Secondary | ICD-10-CM | POA: Diagnosis present

## 2013-06-18 DIAGNOSIS — K8021 Calculus of gallbladder without cholecystitis with obstruction: Secondary | ICD-10-CM

## 2013-06-18 DIAGNOSIS — E785 Hyperlipidemia, unspecified: Secondary | ICD-10-CM

## 2013-06-18 DIAGNOSIS — E1142 Type 2 diabetes mellitus with diabetic polyneuropathy: Secondary | ICD-10-CM | POA: Diagnosis present

## 2013-06-18 DIAGNOSIS — C8583 Other specified types of non-Hodgkin lymphoma, intra-abdominal lymph nodes: Secondary | ICD-10-CM | POA: Diagnosis not present

## 2013-06-18 DIAGNOSIS — Z87442 Personal history of urinary calculi: Secondary | ICD-10-CM

## 2013-06-18 DIAGNOSIS — R05 Cough: Secondary | ICD-10-CM | POA: Diagnosis not present

## 2013-06-18 DIAGNOSIS — E1149 Type 2 diabetes mellitus with other diabetic neurological complication: Secondary | ICD-10-CM | POA: Diagnosis present

## 2013-06-18 DIAGNOSIS — R51 Headache: Secondary | ICD-10-CM

## 2013-06-18 DIAGNOSIS — Z8546 Personal history of malignant neoplasm of prostate: Secondary | ICD-10-CM

## 2013-06-18 DIAGNOSIS — T8140XA Infection following a procedure, unspecified, initial encounter: Secondary | ICD-10-CM

## 2013-06-18 DIAGNOSIS — R059 Cough, unspecified: Secondary | ICD-10-CM | POA: Diagnosis not present

## 2013-06-18 DIAGNOSIS — Z87898 Personal history of other specified conditions: Secondary | ICD-10-CM

## 2013-06-18 DIAGNOSIS — R590 Localized enlarged lymph nodes: Secondary | ICD-10-CM

## 2013-06-18 LAB — COMPREHENSIVE METABOLIC PANEL
ALT: 37 U/L (ref 0–53)
AST: 25 U/L (ref 0–37)
Albumin: 3.6 g/dL (ref 3.5–5.2)
Alkaline Phosphatase: 118 U/L — ABNORMAL HIGH (ref 39–117)
BUN: 14 mg/dL (ref 6–23)
CALCIUM: 8.5 mg/dL (ref 8.4–10.5)
CO2: 26 meq/L (ref 19–32)
Chloride: 93 mEq/L — ABNORMAL LOW (ref 96–112)
Creatinine, Ser: 1 mg/dL (ref 0.50–1.35)
GFR calc non Af Amer: 69 mL/min — ABNORMAL LOW (ref >90)
GFR, EST AFRICAN AMERICAN: 80 mL/min — AB (ref >90)
GLUCOSE: 223 mg/dL — AB (ref 70–99)
Potassium: 3.3 mEq/L — ABNORMAL LOW (ref 3.7–5.3)
Sodium: 134 mEq/L — ABNORMAL LOW (ref 137–147)
TOTAL PROTEIN: 7 g/dL (ref 6.0–8.3)
Total Bilirubin: 0.3 mg/dL (ref 0.3–1.2)

## 2013-06-18 LAB — CBC WITH DIFFERENTIAL/PLATELET
Basophils Absolute: 0 10*3/uL (ref 0.0–0.1)
Basophils Relative: 0 % (ref 0–1)
EOS PCT: 0 % (ref 0–5)
Eosinophils Absolute: 0 10*3/uL (ref 0.0–0.7)
HCT: 26.8 % — ABNORMAL LOW (ref 39.0–52.0)
Hemoglobin: 9.3 g/dL — ABNORMAL LOW (ref 13.0–17.0)
Lymphocytes Relative: 1 % — ABNORMAL LOW (ref 12–46)
Lymphs Abs: 0.2 10*3/uL — ABNORMAL LOW (ref 0.7–4.0)
MCH: 28.9 pg (ref 26.0–34.0)
MCHC: 34.7 g/dL (ref 30.0–36.0)
MCV: 83.2 fL (ref 78.0–100.0)
MONO ABS: 0.7 10*3/uL (ref 0.1–1.0)
MONOS PCT: 3 % (ref 3–12)
NEUTROS PCT: 96 % — AB (ref 43–77)
Neutro Abs: 21.9 10*3/uL — ABNORMAL HIGH (ref 1.7–7.7)
Platelets: 49 10*3/uL — ABNORMAL LOW (ref 150–400)
RBC: 3.22 MIL/uL — AB (ref 4.22–5.81)
RDW: 18.3 % — ABNORMAL HIGH (ref 11.5–15.5)
WBC: 22.8 10*3/uL — AB (ref 4.0–10.5)

## 2013-06-18 LAB — MAGNESIUM: Magnesium: 1.2 mg/dL — ABNORMAL LOW (ref 1.5–2.5)

## 2013-06-18 LAB — GLUCOSE, CAPILLARY: Glucose-Capillary: 228 mg/dL — ABNORMAL HIGH (ref 70–99)

## 2013-06-18 MED ORDER — LISINOPRIL 40 MG PO TABS
40.0000 mg | ORAL_TABLET | Freq: Every day | ORAL | Status: DC
Start: 2013-06-19 — End: 2013-06-20
  Administered 2013-06-19 – 2013-06-20 (×2): 40 mg via ORAL
  Filled 2013-06-18 (×2): qty 1

## 2013-06-18 MED ORDER — EZETIMIBE 10 MG PO TABS
10.0000 mg | ORAL_TABLET | Freq: Every day | ORAL | Status: DC
  Administered 2013-06-18 – 2013-06-19 (×2): 10 mg via ORAL
  Filled 2013-06-18 (×3): qty 1

## 2013-06-18 MED ORDER — DOCUSATE SODIUM 100 MG PO CAPS
100.0000 mg | ORAL_CAPSULE | Freq: Two times a day (BID) | ORAL | Status: DC
  Administered 2013-06-18 – 2013-06-19 (×3): 100 mg via ORAL
  Filled 2013-06-18 (×5): qty 1

## 2013-06-18 MED ORDER — VITAMIN D3 25 MCG (1000 UNIT) PO TABS
1000.0000 [IU] | ORAL_TABLET | Freq: Every day | ORAL | Status: DC
  Administered 2013-06-19 – 2013-06-20 (×2): 1000 [IU] via ORAL
  Filled 2013-06-18 (×2): qty 1

## 2013-06-18 MED ORDER — ALLOPURINOL 300 MG PO TABS
300.0000 mg | ORAL_TABLET | Freq: Every day | ORAL | Status: DC
  Administered 2013-06-19 – 2013-06-20 (×2): 300 mg via ORAL
  Filled 2013-06-18 (×2): qty 1

## 2013-06-18 MED ORDER — METFORMIN HCL 500 MG PO TABS
1000.0000 mg | ORAL_TABLET | Freq: Two times a day (BID) | ORAL | Status: DC
  Administered 2013-06-19 – 2013-06-20 (×3): 1000 mg via ORAL
  Filled 2013-06-18 (×5): qty 2

## 2013-06-18 MED ORDER — HYDROCHLOROTHIAZIDE 25 MG PO TABS
25.0000 mg | ORAL_TABLET | Freq: Every day | ORAL | Status: DC
  Administered 2013-06-19 – 2013-06-20 (×2): 25 mg via ORAL
  Filled 2013-06-18 (×2): qty 1

## 2013-06-18 MED ORDER — SERTRALINE HCL 100 MG PO TABS
100.0000 mg | ORAL_TABLET | Freq: Every day | ORAL | Status: DC
Start: 2013-06-19 — End: 2013-06-20
  Administered 2013-06-19 – 2013-06-20 (×2): 100 mg via ORAL
  Filled 2013-06-18 (×2): qty 1

## 2013-06-18 MED ORDER — FENOFIBRATE 160 MG PO TABS
160.0000 mg | ORAL_TABLET | Freq: Every day | ORAL | Status: DC
  Administered 2013-06-18 – 2013-06-19 (×2): 160 mg via ORAL
  Filled 2013-06-18 (×3): qty 1

## 2013-06-18 MED ORDER — ENOXAPARIN SODIUM 40 MG/0.4ML ~~LOC~~ SOLN
40.0000 mg | SUBCUTANEOUS | Status: DC
  Administered 2013-06-18 – 2013-06-19 (×2): 40 mg via SUBCUTANEOUS
  Filled 2013-06-18 (×3): qty 0.4

## 2013-06-18 MED ORDER — MAGNESIUM OXIDE 400 (241.3 MG) MG PO TABS
400.0000 mg | ORAL_TABLET | Freq: Two times a day (BID) | ORAL | Status: DC
  Administered 2013-06-18 – 2013-06-20 (×4): 400 mg via ORAL
  Filled 2013-06-18 (×5): qty 1

## 2013-06-18 MED ORDER — AMLODIPINE BESYLATE 5 MG PO TABS
5.0000 mg | ORAL_TABLET | Freq: Every day | ORAL | Status: DC
  Administered 2013-06-19 – 2013-06-20 (×2): 5 mg via ORAL
  Filled 2013-06-18 (×2): qty 1

## 2013-06-18 MED ORDER — IOHEXOL 300 MG/ML  SOLN
100.0000 mL | Freq: Once | INTRAMUSCULAR | Status: AC | PRN
  Administered 2013-06-18: 100 mL via INTRAVENOUS

## 2013-06-18 MED ORDER — ACETAMINOPHEN 325 MG PO TABS: 650.0000 mg | ORAL_TABLET | Freq: Four times a day (QID) | ORAL | Status: DC | PRN

## 2013-06-18 MED ORDER — INSULIN NPH (HUMAN) (ISOPHANE) 100 UNIT/ML ~~LOC~~ SUSP
35.0000 [IU] | Freq: Every day | SUBCUTANEOUS | Status: DC
  Administered 2013-06-18 – 2013-06-19 (×2): 35 [IU] via SUBCUTANEOUS
  Filled 2013-06-18: qty 10

## 2013-06-18 MED ORDER — OXYCODONE HCL 5 MG PO TABS
5.0000 mg | ORAL_TABLET | ORAL | Status: DC | PRN
  Administered 2013-06-18 – 2013-06-20 (×6): 5 mg via ORAL
  Filled 2013-06-18 (×6): qty 1

## 2013-06-18 MED ORDER — LOPERAMIDE HCL 2 MG PO CAPS: 2.0000 mg | ORAL_CAPSULE | Freq: Four times a day (QID) | ORAL | Status: DC | PRN

## 2013-06-18 MED ORDER — PANTOPRAZOLE SODIUM 40 MG PO TBEC
40.0000 mg | DELAYED_RELEASE_TABLET | Freq: Every day | ORAL | Status: DC
  Administered 2013-06-19 – 2013-06-20 (×2): 40 mg via ORAL
  Filled 2013-06-18 (×2): qty 1

## 2013-06-18 MED ORDER — ACETAMINOPHEN 650 MG RE SUPP: 650.0000 mg | Freq: Four times a day (QID) | RECTAL | Status: DC | PRN

## 2013-06-18 MED ORDER — PROCHLORPERAZINE MALEATE 5 MG PO TABS: 5.0000 mg | ORAL_TABLET | Freq: Three times a day (TID) | ORAL | Status: DC | PRN

## 2013-06-18 MED ORDER — SODIUM CHLORIDE 0.9 % IV SOLN
500.0000 mg | Freq: Three times a day (TID) | INTRAVENOUS | Status: DC
  Administered 2013-06-18 – 2013-06-19 (×3): 500 mg via INTRAVENOUS
  Filled 2013-06-18 (×5): qty 500

## 2013-06-18 MED ORDER — POTASSIUM CHLORIDE IN NACL 20-0.9 MEQ/L-% IV SOLN
INTRAVENOUS | Status: DC
  Administered 2013-06-18 – 2013-06-20 (×4): via INTRAVENOUS
  Filled 2013-06-18 (×7): qty 1000

## 2013-06-18 MED ORDER — VITAMIN C 500 MG PO TABS
500.0000 mg | ORAL_TABLET | Freq: Every day | ORAL | Status: DC
  Administered 2013-06-19 – 2013-06-20 (×2): 500 mg via ORAL
  Filled 2013-06-18 (×2): qty 1

## 2013-06-18 MED ORDER — INSULIN NPH (HUMAN) (ISOPHANE) 100 UNIT/ML ~~LOC~~ SUSP
35.0000 [IU] | Freq: Every day | SUBCUTANEOUS | Status: DC
  Administered 2013-06-19 – 2013-06-20 (×2): 35 [IU] via SUBCUTANEOUS
  Filled 2013-06-18: qty 10

## 2013-06-18 MED ORDER — ATORVASTATIN CALCIUM 20 MG PO TABS
20.0000 mg | ORAL_TABLET | Freq: Every day | ORAL | Status: DC
Start: 2013-06-18 — End: 2013-06-20
  Administered 2013-06-18 – 2013-06-19 (×2): 20 mg via ORAL
  Filled 2013-06-18 (×3): qty 1

## 2013-06-18 MED ORDER — POTASSIUM CHLORIDE CRYS ER 20 MEQ PO TBCR
20.0000 meq | EXTENDED_RELEASE_TABLET | Freq: Two times a day (BID) | ORAL | Status: DC
  Administered 2013-06-18 – 2013-06-20 (×4): 20 meq via ORAL
  Filled 2013-06-18 (×5): qty 1

## 2013-06-18 MED ORDER — MORPHINE SULFATE 2 MG/ML IJ SOLN
2.0000 mg | Freq: Once | INTRAMUSCULAR | Status: AC
Start: 2013-06-18 — End: 2013-06-18
  Administered 2013-06-18: 2 mg via INTRAVENOUS
  Filled 2013-06-18: qty 1

## 2013-06-18 MED ORDER — ATENOLOL 100 MG PO TABS
100.0000 mg | ORAL_TABLET | Freq: Every day | ORAL | Status: DC
  Administered 2013-06-18 – 2013-06-19 (×2): 100 mg via ORAL
  Filled 2013-06-18 (×3): qty 1

## 2013-06-18 MED ORDER — METFORMIN HCL 500 MG PO TABS: 1000.0000 mg | ORAL_TABLET | Freq: Two times a day (BID) | ORAL | Status: DC

## 2013-06-18 MED ORDER — VANCOMYCIN HCL 10 G IV SOLR
1500.0000 mg | INTRAVENOUS | Status: DC
  Administered 2013-06-18 – 2013-06-19 (×2): 1500 mg via INTRAVENOUS
  Filled 2013-06-18 (×3): qty 1500

## 2013-06-18 MED ORDER — INSULIN ASPART 100 UNIT/ML ~~LOC~~ SOLN
0.0000 [IU] | Freq: Three times a day (TID) | SUBCUTANEOUS | Status: DC
  Administered 2013-06-19 (×2): 3 [IU] via SUBCUTANEOUS
  Administered 2013-06-20: 2 [IU] via SUBCUTANEOUS
  Administered 2013-06-20: 3 [IU] via SUBCUTANEOUS

## 2013-06-18 MED ORDER — GABAPENTIN 300 MG PO CAPS
300.0000 mg | ORAL_CAPSULE | Freq: Two times a day (BID) | ORAL | Status: DC
  Administered 2013-06-18 – 2013-06-20 (×4): 300 mg via ORAL
  Filled 2013-06-18 (×7): qty 1

## 2013-06-18 NOTE — H&P (Signed)
ID: [name] OB: 05-20-1931 MR#: [id number] CSN#[id number]  PCP: Horatio Pel, MD  GYN:  SU: [name]  OTHER MD: [name], Legrand Pitts  CHIEF COMPLAINT:  ."My nose is red and getting bigger."  INTERVAL HISTORY:  MR Haag was evaluated in the office 06/17/2013 with complaints of decreased oral intake, upper jaw pain bilaterally. He had a low grade temperature. Denied nasal drainage but reported nasal discomfort and his "nose was getting bigger." He had minor epistaxis.There was congestion bilaterally. On exam his nose was erythematous and swollen all the way to the base of the nose. He was afebrile, though there was a significan leukocytosis (possibly related to growth factors). This raised concerns regarding MRSA and a swab was sent (however it cannot be localized at present)he recived Rocephin 2g IV and a Rx for Augmentin wa scalled in, but family did not realize it had been sent, so it was never started.   Today the patient called complaining that his nose was even larger and redded. He was asked to come by the office for further evaluation.   REVIEW OF SYSTEMS:  Patient again denies fever. He feels like "huis teeth want to push out of his mouth." His nose is running constantly--clear liquid, no blood or mucus. The nose is tender. He denies cough, phlegm or SOB, no UTUI, no LBP or other symptoms. A ROS was otherwise stable    HISTORY OF PRESENT ILLNESS:  From Dr. [name] original note 06/28/2004:   "The patient developed right upper quadrant pain last year and he had an ultrasound April 5th which showed some gallstones without evidence of cholecystitis or ductal dilatation. However, there were multiple lesions in the liver which could not be assessed further. Accordingly on July 31, 2003, [name], showed multiple liver masses, more than 25, most over 1 cm, the largest being in the inferior right lobe,  measuring 4.2 cm. There was also a small mass in the spleen. CT of the pelvis was unremarkable.  The patient had a biopsy of the liver August 01, 2003. The report says only that it was "a lesion in the right lobe of the liver". Presumably this was the largest lesion present. The final pathology [id number] and [id number]) showed only cirrhosis.  The patient has been followed by [name], and a repeat CT scan of the abdomen and pelvis was obtained May 18, 2004. Many of the liver lesions seen previously had actually decreased in size. However, the lesion in the posterior aspect of the lateral segment of the left liver had grown to 7.3 cm. Previously it had measured 2.6 cm. Although it says that no focal abnormalities are seen in the spleen, there is clearly a lesion in the spleen which is likely the one seen previously. CT of the pelvis was essentially negative.  With this information, a second ultrasound-guided biopsy was performed 06/11/04. This was a lesion deep in the left lobe of the liver and therefore, I would think not the same one previously biopsied which was in the right liver. The pathology this time [id number]) shows a poorly differentiated neuroendocrine carcinoma which was positive for chromogranin A, negative for synaptophysin, thyroid transcription factor, a variety of cytokeratins, [name], alpha-fetoprotein and COX-2."   Mr. Lineman was subsequently evaluated at [facility] by Dr. Elon Alas and repeat liver biopsy and review of the earlier biopsy here showed a primary hepatic lymphoma. The patient was treated with R-CHOP  as detailed below and achieved a complete response. He received maintenance rituximab until 2008   More recently he presented with pain "like a kidney stone" and CTs were obtained11/13/2014, which showed no kidney stone but a soft tissue mass in the periaortic/pericaval space in the retroperitoneum. Biopsy of this mass 03/19/2013 confirmed recurrent diffuse large cell  non-Hodgkin's lymphoma. Bone marrow biopsy 04/01/2013 showed a hypercellular bone marrow with no evidence of lymphoma. Patient alsounderwent a PET scan at [facility]. This showed activity in the retroperitoneal mass as well as in the right adrenal gland and in the lungs felt to be related to recurrent lymphoma.  The studies also showed cholelithiasis and with symptoms of cholecystitis the patient proceeded to laparoscopic cholecystectomy 04/04/2013 with benign pathology. A port was placed at the same time to facilitate chemotherapy access.  His subsequent history is detailed below.    PAST MEDICAL HISTORY:  Past Medical History   Diagnosis  Date   .  Diabetes mellitus    .  Hypertension    .  Cancer of liver    .  Kidney stone    .  Prostate cancer    .  Skin cancer    .  Arthritis    .  Anemia    .  GERD (gastroesophageal reflux disease)    .  Lymphoma    Significant for prostate cancer, the patient undergoing prostatectomy January 2001 under [facility] for a Gleason 7, pathologic T3b (positive seminal vesicle involvement) adenocarcinoma with 0 of 2 lymph nodes involved. Other medical problems include sleep apnea, minor coronary artery disease, hypertension, hypertriglyceridemia, cholelithiasis, colon polyps, cirrhosis by biopsy, diabetes, [name], history of nephrolithiasis, status post right renal surgery and history of left rotator cuff repair under [name].   PAST SURGICAL HISTORY:  Past Surgical History   Procedure  Laterality  Date   .  Prostatectomy     .  Kidney stone surgery     .  Rotator cuff repair     .  Ankle surgery      FAMILY HISTORY  Family History   Problem  Relation  Age of Onset   .  Heart disease  Father    The patient's father died at the age of 33 from an MI. The patient's mother died from "old age" at 77. The patient is one of nine siblings. One brother died from  cancer of the esophagus, one sister with lymphoma and a half-brother with lung cancer.   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 He is now retired. His wife, [name], a homemaker, is present today as are the patient's three daughters: [name] is an Scientist, physiological for [employer]; [name], [name]; and [name] is Environmental consultant. Everybody lives in [place]. The patient has five grandchildren. He is a member of [employer].   ADVANCED DIRECTIVES: in place   HEALTH MAINTENANCE:  History   Substance Use Topics   .  Smoking status:  Never Smoker   .  Smokeless tobacco:  Never Used   .  Alcohol Use:  No   Colonoscopy:  PSA: 8.01 (NOV 2014)  Bone density:  Lipid panel:    Allergies   Allergen  Reactions   .  Niacin  Other (See Comments)     headaches    Current Outpatient Prescriptions   Medication  Sig  Dispense  Refill   .  amLODipine (NORVASC) 5 MG tablet  Take 5 mg by mouth daily.     Marland Kitchen  aspirin 81 MG tablet  Take 81 mg by mouth daily.     Marland Kitchen  atenolol (TENORMIN) 100 MG tablet  Take 100 mg by mouth daily.     Marland Kitchen  atorvastatin (LIPITOR) 20 MG tablet  Take 20 mg by mouth daily.     .  cetirizine (ZYRTEC) 10 MG tablet  Take 10 mg by mouth daily as needed for allergies.     .  Cyanocobalamin (VITAMIN B 12 PO)  Take 2,500 mg by mouth daily.     Marland Kitchen  ezetimibe (ZETIA) 10 MG tablet  Take 10 mg by mouth daily.     .  fenofibrate 160 MG tablet  Take 160 mg by mouth daily.     Marland Kitchen  gabapentin (NEURONTIN) 300 MG capsule  Take 300 mg by mouth 2 (two) times daily.     .  hydrochlorothiazide (HYDRODIURIL) 25 MG tablet  Take 25 mg by mouth daily.     .  insulin NPH (HUMULIN N,NOVOLIN N) 100 UNIT/ML injection  Inject 34-36 Units into the skin. 36 units the morning and 34 after  supper     .  lisinopril (PRINIVIL,ZESTRIL) 40 MG tablet  Take 40 mg by mouth daily.     .  metFORMIN (GLUCOPHAGE) 1000 MG tablet  Take 1,000 mg by mouth 2 (two) times daily with a meal.     .  Multiple Vitamins-Minerals (MULTIVITAMIN WITH MINERALS) tablet  Take 1 tablet by mouth daily.     Marland Kitchen  oxyCODONE-acetaminophen (PERCOCET/ROXICET) 5-325 MG per tablet  Take 1-2 tablets by mouth every 4 (four) hours as needed for severe pain.     Marland Kitchen  sertraline (ZOLOFT) 100 MG tablet  Take 100 mg by mouth daily.       Scheduled Meds: Scheduled Meds: Continuous Infusions: PRN Meds:.    Continuous Infusions:  PRN Meds:.     OBJECTIVE: Middle aged White male examined in bed  There were no vitals filed for this visit. -- vitals are pending  ECOG FS:2 Ocular: Sclerae unicteric, pupils equal and round Ear-nose-throat: Nose is swollen and erythematous, now the entire nose as opposed to the lower nose yesterday;the redness extending bilatrealy to the cheeks, R>L; nasal passages are inflamed, but no . evidence of mucor Lungs no rales or rhonchi, examined anterolaterlly Heart regular rate and rhythm, no murmur appreciated  Abd soft, nontender, positive bowel sounds, no masses palpated  MSK no focal spinal tenderness, no joint edema  Neuro: nonfocal; well oriented; frustrated affect    LAB RESULTS:  Basic Metabolic Panel:  Recent Labs Lab 06/12/13 1257 06/13/13 1244 06/17/13 1231 06/17/13 1231  NA 139 138 138  --   K 3.2* 3.1* 2.7*  --   CO2 20* 21* 25  --   GLUCOSE 194* 321* 233*  --   BUN 42.1* 28.1* 17.0  --   CREATININE 2.0* 1.7* 1.2  --   CALCIUM 9.1 9.2 8.3*  --   MG  --   --   --  1.0*   GFR The CrCl is unknown because both a height and weight (above a minimum accepted value) are required for this calculation. Liver Function Tests:  Recent Labs Lab 06/12/13 1257 06/13/13 1244 06/17/13  1231  AST 21 27 24   ALT 46 55 35  ALKPHOS 62 68 83  BILITOT 0.54 0.52 0.48  PROT 6.2*  6.8 6.3*  ALBUMIN 3.7 4.1 3.6   No results found for this basename: LIPASE, AMYLASE,  in the last 168 hours No results found for this basename: AMMONIA,  in the last 168 hours Coagulation profile No results found for this basename: INR, PROTIME,  in the last 168 hours  CBC:  Recent Labs Lab 06/12/13 1256 06/13/13 1244 06/17/13 1230  WBC 1.6* 2.1* 20.7*  NEUTROABS 1.2* 1.6 19.6*  HGB 9.2* 9.8* 9.1*  HCT 27.7* 28.7* 26.7*  MCV 83.9 83.3 83.6  PLT 93* 85* 45*   Cardiac Enzymes: No results found for this basename: CKTOTAL, CKMB, CKMBINDEX, TROPONINI,  in the last 168 hours BNP: No components found with this basename: POCBNP,  CBG: No results found for this basename: GLUCAP,  in the last 168 hours D-Dimer No results found for this basename: DDIMER,  in the last 72 hours Hgb A1c No results found for this basename: HGBA1C,  in the last 72 hours Lipid Profile No results found for this basename: CHOL, HDL, LDLCALC, TRIG, CHOLHDL, LDLDIRECT,  in the last 72 hours Thyroid function studies No results found for this basename: TSH, T4TOTAL, FREET3, T3FREE, THYROIDAB,  in the last 72 hours Anemia work up No results found for this basename: VITAMINB12, FOLATE, FERRITIN, TIBC, IRON, RETICCTPCT,  in the last 72 hours Microbiology No results found for this or any previous visit (from the past 240 hour(s)).    STUDIES:   Pending   ASSESSMENT: 80 y.o. [place] man with a diagnosis of primary hepatic non-Hodgkin's lymphoma established through liver biopsy February 2006, treated with Rituxan x9 and then CHOP x6. All chemotherapy completed in 2007. Maintenance Rituxan discontinued October 2008.   (1) biopsy proven retroperitoneal recurrence documented November 2014; bone marrow biopsy 04/01/2013 negative  (2) on 04/29/2013 rceived the first of two planned cycles of R-ICE chemotherapy with a view to eventual autologous transplant at [facility]  (3) cycle 2 RICE completed on  05/17/2012  (4) PET scan performed on 05/23/2013 revealed marked partial response to chemotherapy with a retroperitoneal mass decrease in metabolic activity from SUV of 13.2 to 6.3. Right adrenal mass size and metabolic activity is resolved when compared to the previous PET scan.   (5) He was evaluated at [facility] by Dr. Tomasa Hosteller for autologuos transplant on 05/27/2013 and was quoted a 3% chance of significant heart damage and possibly death from the treatment and a 50% chance of being alive 5 years from now after transplant (versus perhaps 2 years without). Mr. Dudek will f/u with  Dr. Tomasa Hosteller for transplant after 2 more cycles of the RICE chemotherapy   (6) Received third cycle of RICE chemotherapy starting 06/04/2013  (7) maxillofacial cellulitis, progressing despite one dose IV Rocephin   PLAN:  We are admitting Mr Abraha for further evaluation and IV antibiotics. We will obtain maxillofacial films and a repeat nasal swab for MRSA. Given his immunocompromised state and history of DM we will use broad spectrum ABX speecifically primaxin and vancomycin. There is no evidence of a fungal infection but this remains a concern. We will panculture despite the absence of feer since once ABX are staretd cultures will be less informative. Will also asses his hypokalemia and hypomagnesemia (noted in labs yeterday)  Patient is a full code.  Amada Kingfisher, MD 06/18/2013 5:00 PM

## 2013-06-18 NOTE — Progress Notes (Signed)
ANTIBIOTIC CONSULT NOTE - INITIAL  Pharmacy Consult for Vancomycin Indication: Cellulitis  Allergies  Allergen Reactions  . Niacin Other (See Comments)    headaches    Patient Measurements: Height: 5\' 9"  (175.3 cm) Weight: 190 lb (86.183 kg) IBW/kg (Calculated) : 70.7  Vital Signs: Temp: 98.4 F (36.9 C) (02/24 1750) Temp src: Oral (02/24 1750) BP: 148/67 mmHg (02/24 1750) Pulse Rate: 74 (02/24 1750) Intake/Output from previous day:   Intake/Output from this shift:    Labs:  Recent Labs  06/17/13 1230 06/17/13 1231  WBC 20.7*  --   HGB 9.1*  --   PLT 45*  --   CREATININE  --  1.2   Estimated Creatinine Clearance: 54.3 ml/min (by C-G formula based on Cr of 1.2). No results found for this basename: VANCOTROUGH, VANCOPEAK, VANCORANDOM, GENTTROUGH, GENTPEAK, GENTRANDOM, TOBRATROUGH, TOBRAPEAK, TOBRARND, AMIKACINPEAK, AMIKACINTROU, AMIKACIN,  in the last 72 hours   Microbiology: No results found for this or any previous visit (from the past 720 hour(s)).  Medical History: Past Medical History  Diagnosis Date  . Hypertension   . Cancer of liver   . Kidney stone   . Prostate cancer   . Skin cancer   . Arthritis   . Anemia   . GERD (gastroesophageal reflux disease)   . Lymphoma   . [name]   . Sleep apnea   . Neuropathy in diabetes     Hx: of  . Diabetes mellitus     INSULIN DEPENDENT  . HX, PERSONAL, MALIGNANCY, PROSTATE 07/28/2006    Annotation: 2001, resected Qualifier: Diagnosis of  By: [name], [name]      Medications:  Scheduled:  . allopurinol  300 mg Oral Daily  . amLODipine  5 mg Oral Daily  . atenolol  100 mg Oral QHS  . atorvastatin  20 mg Oral QHS  . cholecalciferol  1,000 Units Oral Daily  . docusate sodium  100 mg Oral BID  . enoxaparin (LOVENOX) injection  40 mg Subcutaneous Q24H  . ezetimibe  10 mg Oral QHS  . fenofibrate  160 mg Oral QHS  . gabapentin  300 mg Oral BID  . hydrochlorothiazide  25 mg Oral Daily  .  imipenem-cilastatin  500 mg Intravenous Q8H  . [START ON 06/19/2013] insulin aspart  0-15 Units Subcutaneous TID WC  . [START ON 06/19/2013] insulin NPH Human  35 Units Subcutaneous QAC breakfast  . insulin NPH Human  35 Units Subcutaneous QHS  . lisinopril  40 mg Oral Daily  . magnesium oxide  400 mg Oral BID  . [START ON 06/19/2013] metFORMIN  1,000 mg Oral BID WC  . pantoprazole  40 mg Oral Daily  . potassium chloride SA  20 mEq Oral BID  . sertraline  100 mg Oral Daily  . vitamin C  500 mg Oral Daily   Infusions:  . 0.9 % NaCl with KCl 20 mEq / L     Assessment:  80 yr male with history of Non-Hodgkins Lyymphoma and h/o prostate cancer  Seen by oncologist as outpatient on 2/23 for nasal/sinus pain. Received Rocephin 2gm x 1  Worsening symptoms, so patient to be admitted for maxillofacial cellulitis and IV Vancomycin per pharmacy dosing and Primaxin per MD dosing ordered to begin  Plan to obtain nasal culture and obtain nasal swab for MRSA  Goal of Therapy:  Vancomycin trough level 10-15 mcg/ml  Plan:  Measure antibiotic drug levels at steady state Follow up culture results Primaxin 500mg  IV  q8h (per MD dosing) Vancomycin 1500mg  IV q24h  Everette Rank, PharmD 06/18/2013,6:05 PM

## 2013-06-18 NOTE — Telephone Encounter (Signed)
Called [name] before [facility] phone lines turned over to C.A.N.  Learned "he is on the way.  They have a [address] drive.  Plan to see Dr. Jana Hakim and be admitted after today's visit."

## 2013-06-18 NOTE — Telephone Encounter (Signed)
Patient's daughter called reporting she is sending her spouse to patient's home to check his temperature.  Patient is perspiring and his nose is red, swelling and painful.  Asked what nasal culture reads.  No preliminary results noted in EPIC by this nurse at time of call.  Instructed to go to ED if fever > 100.5.  [name] does not want him to go to ED.  Asked if he has started the oral antibiotics and they "were not aware one had been called in, we thought pills we to be started pending the culture results".   Dr. Jana Hakim notified of the call.  If family can get him here we can repeat the IV antibiotics for fever.  [name] will call with report of temperature when spouse notifies her.  Awaiting return call from [name].

## 2013-06-19 ENCOUNTER — Other Ambulatory Visit: Payer: Self-pay | Admitting: Oncology

## 2013-06-19 ENCOUNTER — Encounter: Payer: Self-pay | Admitting: Oncology

## 2013-06-19 ENCOUNTER — Encounter (HOSPITAL_COMMUNITY): Payer: Self-pay

## 2013-06-19 DIAGNOSIS — L03211 Cellulitis of face: Principal | ICD-10-CM

## 2013-06-19 DIAGNOSIS — L0201 Cutaneous abscess of face: Principal | ICD-10-CM

## 2013-06-19 DIAGNOSIS — C8589 Other specified types of non-Hodgkin lymphoma, extranodal and solid organ sites: Secondary | ICD-10-CM

## 2013-06-19 DIAGNOSIS — A4902 Methicillin resistant Staphylococcus aureus infection, unspecified site: Secondary | ICD-10-CM

## 2013-06-19 LAB — CBC
HCT: 26.6 % — ABNORMAL LOW (ref 39.0–52.0)
Hemoglobin: 9.1 g/dL — ABNORMAL LOW (ref 13.0–17.0)
MCH: 28.4 pg (ref 26.0–34.0)
MCHC: 34.2 g/dL (ref 30.0–36.0)
MCV: 83.1 fL (ref 78.0–100.0)
Platelets: 50 10*3/uL — ABNORMAL LOW (ref 150–400)
RBC: 3.2 MIL/uL — AB (ref 4.22–5.81)
RDW: 18.5 % — AB (ref 11.5–15.5)
WBC: 21.7 10*3/uL — AB (ref 4.0–10.5)

## 2013-06-19 LAB — BASIC METABOLIC PANEL
BUN: 10 mg/dL (ref 6–23)
CHLORIDE: 98 meq/L (ref 96–112)
CO2: 25 mEq/L (ref 19–32)
CREATININE: 0.88 mg/dL (ref 0.50–1.35)
Calcium: 8.4 mg/dL (ref 8.4–10.5)
GFR calc non Af Amer: 80 mL/min — ABNORMAL LOW (ref >90)
Glucose, Bld: 107 mg/dL — ABNORMAL HIGH (ref 70–99)
POTASSIUM: 3.3 meq/L — AB (ref 3.7–5.3)
SODIUM: 138 meq/L (ref 137–147)

## 2013-06-19 LAB — GLUCOSE, CAPILLARY
Glucose-Capillary: 111 mg/dL — ABNORMAL HIGH (ref 70–99)
Glucose-Capillary: 166 mg/dL — ABNORMAL HIGH (ref 70–99)
Glucose-Capillary: 175 mg/dL — ABNORMAL HIGH (ref 70–99)
Glucose-Capillary: 243 mg/dL — ABNORMAL HIGH (ref 70–99)

## 2013-06-19 LAB — MRSA CULTURE

## 2013-06-19 MED ORDER — POTASSIUM CHLORIDE 10 MEQ/100ML IV SOLN
10.0000 meq | INTRAVENOUS | Status: AC
  Administered 2013-06-19 (×2): 10 meq via INTRAVENOUS
  Filled 2013-06-19 (×2): qty 100

## 2013-06-19 MED ORDER — MORPHINE SULFATE 2 MG/ML IJ SOLN: 2.0000 mg | INTRAMUSCULAR | Status: DC | PRN

## 2013-06-19 NOTE — Care Management Note (Signed)
UR complete    Michael Essex,MSN,RN [phone] 

## 2013-06-19 NOTE — Progress Notes (Signed)
JSON [name]   DOB:[birth year]   WN#:[id number]   XFG#:[id number]  Subjective: "[name]" tells me he is "better--his nose, which ran constantly yesterday, is now dry. His teeth which felt like they (wanted to push off" his maxilla are more comfortable. The rest of a ROS today was entirely stable   Objective: middle aged white male exmained in recliner Filed Vitals:   06/19/13 0545  BP: 134/63  Pulse: 67  Temp: 98.2 F (36.8 C)  Resp: 16    Body mass index is 27.37 kg/(m^2).  Intake/Output Summary (Last 24 hours) at 06/19/13 1000 Last data filed at 06/19/13 0600  Gross per 24 hour  Intake 2023.33 ml  Output   2005 ml  Net  18.33 ml     Sclerae unicteric  .Nose is red and swollen, perhaps a bit less so than yesterday; erythema extends to the uppr cheeks bilaterally, but no further than yesterday and appears more marginated. Nares show no evidence of mucor  No peripheral adenopathy  Lungs clear -- no rales or rhonchi  Heart regular rate and rhythm  Abdomen benign  MSK no focal spinal tenderness, no peripheral edema  Neuro nonfocal, well oriented, appropriate affect    CBG (last 3)   Recent Labs  06/18/13 2104 06/19/13 0728  GLUCAP 228* 111*     Labs:  Lab Results  Component Value Date   WBC 21.7* 06/19/2013   HGB 9.1* 06/19/2013   HCT 26.6* 06/19/2013   MCV 83.1 06/19/2013   PLT 50* 06/19/2013   NEUTROABS 21.9* 06/18/2013    _0 @  Urine Studies No results found for this basename: UACOL, UAPR, USPG, UPH, UTP, UGL, UKET, UBIL, UHGB, UNIT, UROB, ULEU, UEPI, UWBC, URBC, UBAC, CAST, CRYS, UCOM, BILUA,  in the last 72 hours  Basic Metabolic Panel:  Recent Labs Lab 06/12/13 1257 06/13/13 1244 06/17/13 1231 06/17/13 1231 06/18/13 2000 06/19/13 0630  NA 139 138 138  --  134* 138  K 3.2* 3.1* 2.7*  --  3.3* 3.3*  CL  --   --   --   --  93* 98  CO2 20* 21* 25  --  26 25  GLUCOSE 194* 321* 233*  --  223* 107*  BUN 42.1* 28.1* 17.0  --  14 10  CREATININE  2.0* 1.7* 1.2  --  1.00 0.88  CALCIUM 9.1 9.2 8.3*  --  8.5 8.4  MG  --   --   --  1.0* 1.2*  --    GFR Estimated Creatinine Clearance: 68.1 ml/min (by C-G formula based on Cr of 0.88). Liver Function Tests:  Recent Labs Lab 06/12/13 1257 06/13/13 1244 06/17/13 1231 06/18/13 2000  AST _1 ALT 46 55 35 37  ALKPHOS 62 68 83 118*  BILITOT 0.54 0.52 0.48 0.3  PROT 6.2* 6.8 6.3* 7.0  ALBUMIN 3.7 4.1 3.6 3.6   No results found for this basename: LIPASE, AMYLASE,  in the last 168 hours No results found for this basename: AMMONIA,  in the last 168 hours Coagulation profile No results found for this basename: INR, PROTIME,  in the last 168 hours  CBC:  Recent Labs Lab 06/12/13 1256 06/13/13 1244 06/17/13 1230 06/18/13 2000 06/19/13 0630  WBC 1.6* 2.1* 20.7* 22.8* 21.7*  NEUTROABS 1.2* 1.6 19.6* 21.9*  --   HGB 9.2* 9.8* 9.1* 9.3* 9.1*  HCT 27.7* 28.7* 26.7* 26.8* 26.6*  MCV 83.9 83.3 83.6 83.2 83.1  PLT 93* 85* 45* 49* 50*  Cardiac Enzymes: No results found for this basename: CKTOTAL, CKMB, CKMBINDEX, TROPONINI,  in the last 168 hours BNP: No components found with this basename: POCBNP,  CBG:  Recent Labs Lab 06/18/13 2104 06/19/13 0728  GLUCAP 228* 111*   D-Dimer No results found for this basename: DDIMER,  in the last 72 hours Hgb A1c No results found for this basename: HGBA1C,  in the last 72 hours Lipid Profile No results found for this basename: CHOL, HDL, LDLCALC, TRIG, CHOLHDL, LDLDIRECT,  in the last 72 hours Thyroid function studies No results found for this basename: TSH, T4TOTAL, FREET3, T3FREE, THYROIDAB,  in the last 72 hours Anemia work up No results found for this basename: VITAMINB12, FOLATE, FERRITIN, TIBC, IRON, RETICCTPCT,  in the last 72 hours Microbiology No results found for this or any previous visit (from the past 240 hour(s)).    Studies:  Dg Chest 2 View  06/18/2013   CLINICAL DATA:  Cough.  Immunocompromised.   Ex-smoker.  EXAM: CHEST  2 VIEW  COMPARISON:  04/04/2013.  FINDINGS: Normal sized heart. Clear lungs with the exception of minimal residual linear scarring at both lateral costophrenic angles. The right subclavian port catheter tip is currently projected in the upper right atrium. Small amount of linear atelectasis or scarring in the left mid lung zone. Thoracic spine degenerative changes and left shoulder fixation anchors.  IMPRESSION: No acute abnormality.   Electronically Signed   By: Enrique Sack M.D.   On: 06/18/2013 20:15   Ct Maxillofacial W/cm  06/18/2013   CLINICAL DATA:  80 year old male is immunocompromised due to chemotherapy for lymphoma. Nasal swelling and redness, sinus pressure. Evaluate for invasive infection. Initial encounter.  EXAM: CT MAXILLOFACIAL WITH CONTRAST  TECHNIQUE: Multidetector CT imaging of the maxillofacial structures was performed with intravenous contrast. Multiplanar CT image reconstructions were also generated. A small metallic BB was placed on the right temple in order to reliably differentiate right from left.  CONTRAST:  157m OMNIPAQUE IOHEXOL 300 MG/ML  SOLN  COMPARISON:  PET-CT 05/23/2013.  FINDINGS: Grossly negative visualized brain parenchyma. Intracranial artery dolichoectasia. Extensive Calcified atherosclerosis at the skull base. Major vascular structures at the skullbase and in the visualized neck appear patent.  Stable and negative visualized tympanic cavities and mastoids. The sphenoid sinuses are clear. Visible calvarium intact.  Mandible intact, but widespread poor dentition.  Severe soft tissue thickening at the bridge of the nose, up to 2 cm in thickness. The abnormal soft tissue thickening and increased density tracks into the anterior cartilaginous nasal septum (series 3, image 43). With no discrete nasal septal abscess identified. No subcutaneous gas. No erosion of the nasal bones or maxilla.  Bilateral nasolacrimal ducts appear within normal limits.  Bilateral orbits soft tissues appear within normal limits and unaffected.  Trace layering low-density fluid in the right maxillary sinus, stable. Minimal maxillary and ethmoid sinus mucosal thickening elsewhere. Frontal sinuses are clear.  Other visualized deep soft tissue spaces of the face remain within normal limits.  IMPRESSION: 1. Severe nasal cellulitis, with extension into the anterior cartilaginous nasal septum - but no septal abscess at this time. Consider fungal cellulitis. No CT changes of underlying osteomyelitis. 2. Trace paranasal sinus inflammatory changes, stable since the January PET-CT. 3. Widespread poor dentition.   Electronically Signed   By: LLars PinksM.D.   On: 06/18/2013 19:26     Assessment: 80y.o. GVietnamman with a diagnosis of primary hepatic non-Hodgkin's lymphoma established through liver biopsy February 2006, treated with  Rituxan x9 and then CHOP x6. All chemotherapy completed in 2007. Maintenance Rituxan discontinued October 2008.  (1) biopsy proven retroperitoneal recurrence documented November 2014; bone marrow biopsy 04/01/2013 negative  (2) on 04/29/2013 rceived the first of two planned cycles of R-ICE chemotherapy with a view to eventual autologous transplant at [facility]  (3) cycle 2 RICE completed on 05/17/2012  (4) PET scan performed on 05/23/2013 revealed marked partial response to chemotherapy with a retroperitoneal mass decrease in metabolic activity from SUV of 13.2 to 6.3. Right adrenal mass size and metabolic activity is resolved when compared to the previous PET scan.  (5) He was evaluated at [facility] by Dr. Tomasa Hosteller for autologuos transplant on 05/27/2013 and was quoted a 3% chance of significant heart damage and possibly death from the treatment and a 50% chance of being alive 5 years from now after transplant (versus perhaps 2 years without). Mr. Christen will f/u with Dr. Tomasa Hosteller for transplant after 2 more cycles of the RICE chemotherapy  (6) Received third  cycle of RICE chemotherapy starting 06/04/2013  (7) maxillofacial cellulitis, progressing despite one dose IV Rocephin    Plan:The maxillofacial CT shows no bone erosion or abscess. He is clinically slightly better today. That (and the exam) leads me away from concern re fungus.    The question is how to proceed at this point. With the snow coming tomorrow I would love to discharge him today. However we will need ID input. Of course the MRSA swab is pending. It may be he will need to stay in house a little longer. In any case the plans for RICE #4 next week will have to be postponed.   "[name]" has a good understanding of this plan and agrees with it. At this point he is still thinking he will not want to proceed to ASCT at [facility] [place] this Spring (the fact that his wife Bessie died last week doubtless enters into this decision).Marland Kitchen   [name], MD 06/19/2013  10:00 AM

## 2013-06-19 NOTE — Consult Note (Addendum)
[name] for Infectious Disease  Total days of antibiotics 3        Day 2 vanco        Day 2 imipenem        ( 1dose of ctx)       Reason for Consult: cellulitis in immunocompromised host    Referring Physician: magrinat  Active Problems:   Cellulitis diffuse, face    HPI: [name] is a 80 y.o. male with recurrent high grade DLBC NHL s/p 3 of 4 cycles of RICE finished on 2/12, plus G-CSF on 2/14. Last cycle of chemo complicated by mild dehydration with elevated Cr, received IVF in clinic. 5 days prior to this admission, he was noted to have erythema to nose, nsal congestion, rhinorrhea, and mild epistaxis. But afebrile. He was seen in oncology clinic on 2/23, where he had signs consistent with cellulitis +/- sinusitis. He was given a dose ceftriaxone in clinic plus amox/clav for 10 days. Due to worsening symptoms of swelling of his nose, maxillary tenderness and spreading erythema, he was admitted on 06/18/13 for evaluation of facial cellulitis. He was started on vancomycin and imipenem. Maxillo-Facial CT showed severe nasal cellulitis with extension into anterior and cartilaginous nasal septum. No septal abscess. No bone erosion to suggest osteomyelitis. The patient feels that he is having some improvement with first 24hrs of antibiotics infusions. Lab shows elevated WBC of 20.  He has otherwise not had difficulty with his chemo regimens. No other sick contacts. His small dog has been diagnosed with ear infection that was often sitting on his lap and he would pet and rub his dogs ears during this time period.  Cancer hx: Primary hepatic lymphoma s/p R-CHOP in 2007 followed by rituximab til 2008 Recurrent high grade DLBC NHL noted on path of retroperitoneal mass Nov 2008 S/p lap chole and port placement on Dec 2014 RICE cycle #1 completed 05/02/13 RICE cycle #2 completed 05/17/13 RICE cycle #3 completed 06/06/13  Past Medical History  Diagnosis Date  . Hypertension   .  Cancer of liver   . Kidney stone   . Prostate cancer   . Skin cancer   . Arthritis   . Anemia   . GERD (gastroesophageal reflux disease)   . Lymphoma   . [name]   . Sleep apnea   . Neuropathy in diabetes     Hx: of  . Diabetes mellitus     INSULIN DEPENDENT  . HX, PERSONAL, MALIGNANCY, PROSTATE 07/28/2006    Annotation: 2001, resected Qualifier: Diagnosis of  By: [name], [name]      Allergies:  Allergies  Allergen Reactions  . Niacin Other (See Comments)    headaches    MEDICATIONS: . allopurinol  300 mg Oral Daily  . amLODipine  5 mg Oral Daily  . atenolol  100 mg Oral QHS  . atorvastatin  20 mg Oral QHS  . cholecalciferol  1,000 Units Oral Daily  . docusate sodium  100 mg Oral BID  . enoxaparin (LOVENOX) injection  40 mg Subcutaneous Q24H  . ezetimibe  10 mg Oral QHS  . fenofibrate  160 mg Oral QHS  . gabapentin  300 mg Oral BID  . hydrochlorothiazide  25 mg Oral Daily  . imipenem-cilastatin  500 mg Intravenous Q8H  . insulin aspart  0-15 Units Subcutaneous TID WC  . insulin NPH Human  35 Units Subcutaneous QAC breakfast  . insulin NPH Human  35 Units Subcutaneous QHS  .  lisinopril  40 mg Oral Daily  . magnesium oxide  400 mg Oral BID  . metFORMIN  1,000 mg Oral BID WC  . pantoprazole  40 mg Oral Daily  . potassium chloride  10 mEq Intravenous Q1 Hr x 2  . potassium chloride SA  20 mEq Oral BID  . sertraline  100 mg Oral Daily  . vancomycin  1,500 mg Intravenous Q24H  . vitamin C  500 mg Oral Daily    History  Substance Use Topics  . Smoking status: Former Smoker    Types: Cigarettes, Pipe, Landscape architect  . Smokeless tobacco: Never Used     Comment: QUIT SMOKING MANY YEARS AGO "  . Alcohol Use: No    Family History  Problem Relation Age of Onset  . Heart disease Father     Review of Systems  Constitutional: Negative for fever, chills, diaphoresis, activity change, appetite change, fatigue and unexpected weight change.  HENT: runny nose.  Tenderness to cheeks bilaterally. Intermittent epistaxis. Swollen nose. Negative for congestion, sore throat, , sneezing, trouble swallowing and sinus pressure.  Eyes: injected eyes Negative for photophobia and visual disturbance.  Respiratory: Negative for cough, chest tightness, shortness of breath, wheezing and stridor.  Cardiovascular: Negative for chest pain, palpitations and leg swelling.  Gastrointestinal: Negative for nausea, vomiting, abdominal pain, diarrhea, constipation, blood in stool, abdominal distention and anal bleeding.  Genitourinary: Negative for dysuria, hematuria, flank pain and difficulty urinating.  Musculoskeletal: Negative for myalgias, back pain, joint swelling, arthralgias and gait problem.  Skin: per hpi Neurological: Negative for dizziness, tremors, weakness and light-headedness.  Hematological: Negative for adenopathy. Does not bruise/bleed easily.  Psychiatric/Behavioral: Negative for behavioral problems, confusion, sleep disturbance, dysphoric mood, decreased concentration and agitation.     OBJECTIVE: Temp:  [98.2 F (36.8 C)-98.8 F (37.1 C)] 98.2 F (36.8 C) (02/25 0545) Pulse Rate:  [67-74] 67 (02/25 0545) Resp:  [16-18] 16 (02/25 0545) BP: (134-150)/(63-68) 134/63 mmHg (02/25 0545) SpO2:  [96 %-98 %] 98 % (02/25 0545) Weight:  [185 lb 6.4 oz (84.097 kg)-190 lb (86.183 kg)] 185 lb 6.4 oz (84.097 kg) (02/25 0545)  Constitutional: He is oriented to person, place, and time. He appears well-developed and well-nourished. No distress.  HENT: erythematous bulbous indurated nose. Mild swelling to right cheek that still remains mildly erythamatous incomparison to left cheek. Ant nares has blood crusted ? Scab, edematous turbinates. Mouth/Throat: Oropharynx is clear and moist. No oropharyngeal exudate.  Cardiovascular: Normal rate, regular rhythm and normal heart sounds. Exam reveals no gallop and no friction rub.  No murmur heard.  Pulmonary/Chest: Effort  normal and breath sounds normal. No respiratory distress. He has no wheezes.  Abdominal: Soft. Bowel sounds are normal. He exhibits no distension. There is no tenderness.  Lymphadenopathy:  He has no cervical adenopathy.  Neurological: He is alert and oriented to person, place, and time.  Skin: Skin is warm and dry. No rash noted. No erythema.  Psychiatric: He has a normal mood and affect. His behavior is normal.    LABS: Results for orders placed during the hospital encounter of 06/18/13 (from the past 48 hour(s))  COMPREHENSIVE METABOLIC PANEL     Status: Abnormal   Collection Time    06/18/13  8:00 PM      Result Value Ref Range   Sodium 134 (*) 137 - 147 mEq/L   Potassium 3.3 (*) 3.7 - 5.3 mEq/L   Chloride 93 (*) 96 - 112 mEq/L   CO2 26  19 -  32 mEq/L   Glucose, Bld 223 (*) 70 - 99 mg/dL   BUN 14  6 - 23 mg/dL   Creatinine, Ser 1.00  0.50 - 1.35 mg/dL   Calcium 8.5  8.4 - 10.5 mg/dL   Total Protein 7.0  6.0 - 8.3 g/dL   Albumin 3.6  3.5 - 5.2 g/dL   AST 25  0 - 37 U/L   ALT 37  0 - 53 U/L   Alkaline Phosphatase 118 (*) 39 - 117 U/L   Total Bilirubin 0.3  0.3 - 1.2 mg/dL   GFR calc non Af Amer 69 (*) >90 mL/min   GFR calc Af Amer 80 (*) >90 mL/min   Comment: (NOTE)     The eGFR has been calculated using the CKD EPI equation.     This calculation has not been validated in all clinical situations.     eGFR's persistently <90 mL/min signify possible Chronic Kidney     Disease.  MAGNESIUM     Status: Abnormal   Collection Time    06/18/13  8:00 PM      Result Value Ref Range   Magnesium 1.2 (*) 1.5 - 2.5 mg/dL  CBC WITH DIFFERENTIAL     Status: Abnormal   Collection Time    06/18/13  8:00 PM      Result Value Ref Range   WBC 22.8 (*) 4.0 - 10.5 K/uL   RBC 3.22 (*) 4.22 - 5.81 MIL/uL   Hemoglobin 9.3 (*) 13.0 - 17.0 g/dL   HCT 26.8 (*) 39.0 - 52.0 %   MCV 83.2  78.0 - 100.0 fL   MCH 28.9  26.0 - 34.0 pg   MCHC 34.7  30.0 - 36.0 g/dL   RDW 18.3 (*) 11.5 - 15.5 %    Platelets 49 (*) 150 - 400 K/uL   Comment: REPEATED TO VERIFY     SPECIMEN CHECKED FOR CLOTS     PLATELET COUNT CONFIRMED BY SMEAR   Neutrophils Relative % 96 (*) 43 - 77 %   Lymphocytes Relative 1 (*) 12 - 46 %   Monocytes Relative 3  3 - 12 %   Eosinophils Relative 0  0 - 5 %   Basophils Relative 0  0 - 1 %   Neutro Abs 21.9 (*) 1.7 - 7.7 K/uL   Lymphs Abs 0.2 (*) 0.7 - 4.0 K/uL   Monocytes Absolute 0.7  0.1 - 1.0 K/uL   Eosinophils Absolute 0.0  0.0 - 0.7 K/uL   Basophils Absolute 0.0  0.0 - 0.1 K/uL   RBC Morphology POLYCHROMASIA PRESENT     Comment: TEARDROP CELLS   WBC Morphology MILD LEFT SHIFT (1-5% METAS, OCC MYELO, OCC BANDS)     Comment: TOXIC GRANULATION     DOHLE BODIES  GLUCOSE, CAPILLARY     Status: Abnormal   Collection Time    06/18/13  9:04 PM      Result Value Ref Range   Glucose-Capillary 228 (*) 70 - 99 mg/dL   Comment 1 Notify RN    BASIC METABOLIC PANEL     Status: Abnormal   Collection Time    06/19/13  6:30 AM      Result Value Ref Range   Sodium 138  137 - 147 mEq/L   Potassium 3.3 (*) 3.7 - 5.3 mEq/L   Chloride 98  96 - 112 mEq/L   CO2 25  19 - 32 mEq/L   Glucose, Bld 107 (*) 70 - 99  mg/dL   BUN 10  6 - 23 mg/dL   Creatinine, Ser 0.88  0.50 - 1.35 mg/dL   Calcium 8.4  8.4 - 10.5 mg/dL   GFR calc non Af Amer 80 (*) >90 mL/min   GFR calc Af Amer >90  >90 mL/min   Comment: (NOTE)     The eGFR has been calculated using the CKD EPI equation.     This calculation has not been validated in all clinical situations.     eGFR's persistently <90 mL/min signify possible Chronic Kidney     Disease.  CBC     Status: Abnormal   Collection Time    06/19/13  6:30 AM      Result Value Ref Range   WBC 21.7 (*) 4.0 - 10.5 K/uL   RBC 3.20 (*) 4.22 - 5.81 MIL/uL   Hemoglobin 9.1 (*) 13.0 - 17.0 g/dL   HCT 26.6 (*) 39.0 - 52.0 %   MCV 83.1  78.0 - 100.0 fL   MCH 28.4  26.0 - 34.0 pg   MCHC 34.2  30.0 - 36.0 g/dL   RDW 18.5 (*) 11.5 - 15.5 %   Platelets 50  (*) 150 - 400 K/uL   Comment: CONSISTENT WITH PREVIOUS RESULT  GLUCOSE, CAPILLARY     Status: Abnormal   Collection Time    06/19/13  7:28 AM      Result Value Ref Range   Glucose-Capillary 111 (*) 70 - 99 mg/dL    MICRO:  mrsa cx 2/23: POSITIVE IMAGING: Dg Chest 2 View  06/18/2013   CLINICAL DATA:  Cough.  Immunocompromised.  Ex-smoker.  EXAM: CHEST  2 VIEW  COMPARISON:  04/04/2013.  FINDINGS: Normal sized heart. Clear lungs with the exception of minimal residual linear scarring at both lateral costophrenic angles. The right subclavian port catheter tip is currently projected in the upper right atrium. Small amount of linear atelectasis or scarring in the left mid lung zone. Thoracic spine degenerative changes and left shoulder fixation anchors.  IMPRESSION: No acute abnormality.   Electronically Signed   By: Enrique Sack M.D.   On: 06/18/2013 20:15   Ct Maxillofacial W/cm  06/18/2013   CLINICAL DATA:  80 year old male is immunocompromised due to chemotherapy for lymphoma. Nasal swelling and redness, sinus pressure. Evaluate for invasive infection. Initial encounter.  EXAM: CT MAXILLOFACIAL WITH CONTRAST  TECHNIQUE: Multidetector CT imaging of the maxillofacial structures was performed with intravenous contrast. Multiplanar CT image reconstructions were also generated. A small metallic BB was placed on the right temple in order to reliably differentiate right from left.  CONTRAST:  156m OMNIPAQUE IOHEXOL 300 MG/ML  SOLN  COMPARISON:  PET-CT 05/23/2013.  FINDINGS: Grossly negative visualized brain parenchyma. Intracranial artery dolichoectasia. Extensive Calcified atherosclerosis at the skull base. Major vascular structures at the skullbase and in the visualized neck appear patent.  Stable and negative visualized tympanic cavities and mastoids. The sphenoid sinuses are clear. Visible calvarium intact.  Mandible intact, but widespread poor dentition.  Severe soft tissue thickening at the bridge of the  nose, up to 2 cm in thickness. The abnormal soft tissue thickening and increased density tracks into the anterior cartilaginous nasal septum (series 3, image 43). With no discrete nasal septal abscess identified. No subcutaneous gas. No erosion of the nasal bones or maxilla.  Bilateral nasolacrimal ducts appear within normal limits. Bilateral orbits soft tissues appear within normal limits and unaffected.  Trace layering low-density fluid in the right maxillary sinus, stable. Minimal maxillary  and ethmoid sinus mucosal thickening elsewhere. Frontal sinuses are clear.  Other visualized deep soft tissue spaces of the face remain within normal limits.  IMPRESSION: 1. Severe nasal cellulitis, with extension into the anterior cartilaginous nasal septum - but no septal abscess at this time. Consider fungal cellulitis. No CT changes of underlying osteomyelitis. 2. Trace paranasal sinus inflammatory changes, stable since the January PET-CT. 3. Widespread poor dentition.   Electronically Signed   By: Lars Pinks M.D.   On: 06/18/2013 19:26    Assessment/Plan:  80 yo M with recurrent DLBC lymphoma s/p 3rd cycle of RICE on 06/06/13 presents with worsening facial cellulitis concerning for staph/strep cellulitis as well as invasive fungal infections such as mucor. Cultures are positive for MRSA thus will narrow his regimen to treat mrsa cellulitis  - will continue with vancomycin only and discontinue imipenem since MRSA culture is positive - if patient does not improve would recommend to get ENT to evaluate to see if can get specimen for culture, to include fungal stains and fungal culture. For the time being he appears to have some improvement, thus will not initiate antifungals at this time. - will add differential to cbc  Lenord Fralix B. [place] [employer] [facility] [phone]

## 2013-06-20 ENCOUNTER — Other Ambulatory Visit: Payer: Medicare Other

## 2013-06-20 LAB — GLUCOSE, CAPILLARY
GLUCOSE-CAPILLARY: 160 mg/dL — AB (ref 70–99)
Glucose-Capillary: 145 mg/dL — ABNORMAL HIGH (ref 70–99)

## 2013-06-20 MED ORDER — POLYETHYLENE GLYCOL 3350 17 G PO PACK: 17.0000 g | PACK | Freq: Every day | ORAL | Status: DC

## 2013-06-20 MED ORDER — MAGNESIUM HYDROXIDE 400 MG/5ML PO SUSP
30.0000 mL | Freq: Every day | ORAL | Status: DC
  Filled 2013-06-20: qty 30

## 2013-06-20 MED ORDER — VANCOMYCIN HCL 10 G IV SOLR
1500.0000 mg | INTRAVENOUS | Status: DC
  Administered 2013-06-20: 1500 mg via INTRAVENOUS
  Filled 2013-06-20: qty 1500

## 2013-06-20 MED ORDER — DSS 100 MG PO CAPS: 200.0000 mg | ORAL_CAPSULE | Freq: Two times a day (BID) | ORAL | Status: DC

## 2013-06-20 MED ORDER — POLYETHYLENE GLYCOL 3350 17 G PO PACK
17.0000 g | PACK | Freq: Every day | ORAL | Status: DC
  Filled 2013-06-20: qty 1

## 2013-06-20 MED ORDER — DOCUSATE SODIUM 100 MG PO CAPS
200.0000 mg | ORAL_CAPSULE | Freq: Two times a day (BID) | ORAL | Status: DC
  Administered 2013-06-20: 200 mg via ORAL
  Filled 2013-06-20 (×2): qty 2

## 2013-06-20 MED ORDER — OXYCODONE HCL 5 MG PO TABS: 5.0000 mg | ORAL_TABLET | ORAL | Status: DC | PRN

## 2013-06-20 MED ORDER — VANCOMYCIN HCL 10 G IV SOLR: 1500.0000 mg | INTRAVENOUS | Status: DC

## 2013-06-20 NOTE — Progress Notes (Signed)
Patient received discharge instructions with daughter and granddaughter at bedside. All verbalized understanding without further questions. Patient belongings packed. Patient to be taken downstairs via wheelchair to be discharged home. PAC flushed and left accessed prior to discharge for home antibiotics.

## 2013-06-20 NOTE — Progress Notes (Signed)
[name]   DOB:08/16/1931   NH#:[id number]   QJR#:[id number]  Subjective: "[name]" .still has a bit of a runny nose but no epistaxis or purulent drainage; his upper teeth are still a bit uncomfortable but "not like before." He is a bit constipated. Otherwise a ROS today was stable  Objective: middle aged white male exmained in recliner Filed Vitals:   06/20/13 0511  BP: 154/69  Pulse: 69  Temp: 98 F (36.7 C)  Resp: 15    Body mass index is 27.37 kg/(m^2).  Intake/Output Summary (Last 24 hours) at 06/20/13 0807 Last data filed at 06/20/13 0500  Gross per 24 hour  Intake    720 ml  Output   3800 ml  Net  -3080 ml     Sclerae unicteric, EOMs intact, pupils round and reactive  Nose is red and swollen, with erythema extending to upper cheeks bilaterally but a little less red overall with the redder area now localizing halfway down the nose and the bulb of the nose; nares no mucor   No peripheral adenopathy  Lungs clear -- no rales or rhonchi  Heart regular rate and rhythm  Abdomen benign  MSK no focal spinal tenderness, no peripheral edema  Neuro nonfocal, well oriented, appropriate affect    CBG (last 3)   Recent Labs  06/19/13 1705 06/19/13 2111 06/20/13 0704  GLUCAP 175* 243* 145*     Labs:  Lab Results  Component Value Date   WBC 21.7* 06/19/2013   HGB 9.1* 06/19/2013   HCT 26.6* 06/19/2013   MCV 83.1 06/19/2013   PLT 50* 06/19/2013   NEUTROABS 21.9* 06/18/2013    _0 @  Urine Studies No results found for this basename: UACOL, UAPR, USPG, UPH, UTP, UGL, UKET, UBIL, UHGB, UNIT, UROB, ULEU, UEPI, UWBC, URBC, UBAC, CAST, CRYS, UCOM, BILUA,  in the last 72 hours  Basic Metabolic Panel:  Recent Labs Lab 06/13/13 1244 06/17/13 1231 06/17/13 1231 06/18/13 2000 06/19/13 0630  NA 138 138  --  134* 138  K 3.1* 2.7*  --  3.3* 3.3*  CL  --   --   --  93* 98  CO2 21* 25  --  26 25  GLUCOSE 321* 233*  --  223* 107*  BUN 28.1* 17.0  --  14 10   CREATININE 1.7* 1.2  --  1.00 0.88  CALCIUM 9.2 8.3*  --  8.5 8.4  MG  --   --  1.0* 1.2*  --    GFR Estimated Creatinine Clearance: 68.1 ml/min (by C-G formula based on Cr of 0.88). Liver Function Tests:  Recent Labs Lab 06/13/13 1244 06/17/13 1231 06/18/13 2000  AST _1 ALT 55 35 37  ALKPHOS 68 83 118*  BILITOT 0.52 0.48 0.3  PROT 6.8 6.3* 7.0  ALBUMIN 4.1 3.6 3.6   No results found for this basename: LIPASE, AMYLASE,  in the last 168 hours No results found for this basename: AMMONIA,  in the last 168 hours Coagulation profile No results found for this basename: INR, PROTIME,  in the last 168 hours  CBC:  Recent Labs Lab 06/13/13 1244 06/17/13 1230 06/18/13 2000 06/19/13 0630  WBC 2.1* 20.7* 22.8* 21.7*  NEUTROABS 1.6 19.6* 21.9*  --   HGB 9.8* 9.1* 9.3* 9.1*  HCT 28.7* 26.7* 26.8* 26.6*  MCV 83.3 83.6 83.2 83.1  PLT 85* 45* 49* 50*   Cardiac Enzymes: No results found for this basename: CKTOTAL, CKMB, CKMBINDEX, TROPONINI,  in  the last 168 hours BNP: No components found with this basename: POCBNP,  CBG:  Recent Labs Lab 06/19/13 0728 06/19/13 1150 06/19/13 1705 06/19/13 2111 06/20/13 0704  GLUCAP 111* 166* 175* 243* 145*   D-Dimer No results found for this basename: DDIMER,  in the last 72 hours Hgb A1c No results found for this basename: HGBA1C,  in the last 72 hours Lipid Profile No results found for this basename: CHOL, HDL, LDLCALC, TRIG, CHOLHDL, LDLDIRECT,  in the last 72 hours Thyroid function studies No results found for this basename: TSH, T4TOTAL, FREET3, T3FREE, THYROIDAB,  in the last 72 hours Anemia work up No results found for this basename: VITAMINB12, FOLATE, FERRITIN, TIBC, IRON, RETICCTPCT,  in the last 72 hours Microbiology Recent Results (from the past 240 hour(s))  MRSA CULTURE     Status: None   Collection Time    06/17/13  2:50 PM      Result Value Ref Range Status   Staph Screen Nasal (Revised) MRSA Screen    Corrected   Comment: THIS IS A CORRECTED RESULT! Original result:  (Added) MRSA Screen------------------------------------------------------------------------NASALFinal - ===== FINAL REPORT =====Moderate METHICILLIN RESISTANT STAPHYLOCOCCUS AUREUS      Studies:  Dg Chest 2 View  06/18/2013   CLINICAL DATA:  Cough.  Immunocompromised.  Ex-smoker.  EXAM: CHEST  2 VIEW  COMPARISON:  04/04/2013.  FINDINGS: Normal sized heart. Clear lungs with the exception of minimal residual linear scarring at both lateral costophrenic angles. The right subclavian port catheter tip is currently projected in the upper right atrium. Small amount of linear atelectasis or scarring in the left mid lung zone. Thoracic spine degenerative changes and left shoulder fixation anchors.  IMPRESSION: No acute abnormality.   Electronically Signed   By: Enrique Sack M.D.   On: 06/18/2013 20:15   Ct Maxillofacial W/cm  06/18/2013   CLINICAL DATA:  80 year old male is immunocompromised due to chemotherapy for lymphoma. Nasal swelling and redness, sinus pressure. Evaluate for invasive infection. Initial encounter.  EXAM: CT MAXILLOFACIAL WITH CONTRAST  TECHNIQUE: Multidetector CT imaging of the maxillofacial structures was performed with intravenous contrast. Multiplanar CT image reconstructions were also generated. A small metallic BB was placed on the right temple in order to reliably differentiate right from left.  CONTRAST:  143mL OMNIPAQUE IOHEXOL 300 MG/ML  SOLN  COMPARISON:  PET-CT 05/23/2013.  FINDINGS: Grossly negative visualized brain parenchyma. Intracranial artery dolichoectasia. Extensive Calcified atherosclerosis at the skull base. Major vascular structures at the skullbase and in the visualized neck appear patent.  Stable and negative visualized tympanic cavities and mastoids. The sphenoid sinuses are clear. Visible calvarium intact.  Mandible intact, but widespread poor dentition.  Severe soft tissue thickening at the bridge of  the nose, up to 2 cm in thickness. The abnormal soft tissue thickening and increased density tracks into the anterior cartilaginous nasal septum (series 3, image 43). With no discrete nasal septal abscess identified. No subcutaneous gas. No erosion of the nasal bones or maxilla.  Bilateral nasolacrimal ducts appear within normal limits. Bilateral orbits soft tissues appear within normal limits and unaffected.  Trace layering low-density fluid in the right maxillary sinus, stable. Minimal maxillary and ethmoid sinus mucosal thickening elsewhere. Frontal sinuses are clear.  Other visualized deep soft tissue spaces of the face remain within normal limits.  IMPRESSION: 1. Severe nasal cellulitis, with extension into the anterior cartilaginous nasal septum - but no septal abscess at this time. Consider fungal cellulitis. No CT changes of underlying osteomyelitis. 2. Trace  paranasal sinus inflammatory changes, stable since the January PET-CT. 3. Widespread poor dentition.   Electronically Signed   By: Lars Pinks M.D.   On: 06/18/2013 19:26     Assessment: 80 y.o. [place] man with a diagnosis of primary hepatic non-Hodgkin's lymphoma established through liver biopsy February 2006, treated with Rituxan x9 and then CHOP x6. All chemotherapy completed in 2007. Maintenance Rituxan discontinued October 2008.  (1) biopsy proven retroperitoneal recurrence documented November 2014; bone marrow biopsy 04/01/2013 negative  (2) on 04/29/2013 rceived the first of two planned cycles of R-ICE chemotherapy with a view to eventual autologous transplant at [facility]  (3) cycle 2 RICE completed on 05/17/2012  (4) PET scan performed on 05/23/2013 revealed marked partial response to chemotherapy with a retroperitoneal mass decrease in metabolic activity from SUV of 13.2 to 6.3. Right adrenal mass size and metabolic activity is resolved when compared to the previous PET scan.  (5) He was evaluated at [facility] by Dr. Tomasa Hosteller for autologuos  transplant on 05/27/2013 and was quoted a 3% chance of significant heart damage and possibly death from the treatment and a 50% chance of being alive 5 years from now after transplant (versus perhaps 2 years without). Mr. Cumpston will f/u with Dr. Tomasa Hosteller for transplant after 2 more cycles of the RICE chemotherapy  (6) Received third cycle of RICE chemotherapy starting 06/04/2013  (7) maxillofacial cellulitis, swab + for MRSA, on day 3 vancomycin    Plan:ibuprofen think we may be able to get [facility] home today with [facility] to provide IV vancomycin daily, if ID concurs. We will certainly hold off on further chemotherapy until this resolves and perhaps-- since he s not planning to go on to trans[plant-- simply hold off on further chemo until there is evidence of disease progression (we can start him on rituximab maintenance). Will start bowel prophylaxis   MAGRINAT,GUSTAV C, MD 06/20/2013  8:07 AM

## 2013-06-20 NOTE — Discharge Summary (Signed)
Physician Discharge Summary  Patient ID: [name] MRN: [id number] DOB/AGE: 1931-12-26 80 y.o.  Admit date: 06/18/2013 Discharge date: 06/20/2013  Primary Care Physician:  Horatio Pel, MD   Discharge Diagnoses:  maxcillofacial MRSA cellulitis; DM, moderately controlled; hypokalemia; anemia secondary to chemotherapy; thrombocytopenia secondary to chemotherapy; leukocytosis; non-Hodgkin's lymphoma  Present on Admission:  . Cellulitis diffuse, face  Discharge Medications:    Medication List    STOP taking these medications       amoxicillin-clavulanate 875-125 MG per tablet  Commonly known as:  AUGMENTIN     dexamethasone 4 MG tablet  Commonly known as:  DECADRON      TAKE these medications       allopurinol 300 MG tablet  Commonly known as:  ZYLOPRIM  Take 1 tablet (300 mg total) by mouth daily.     amLODipine 5 MG tablet  Commonly known as:  NORVASC  Take 5 mg by mouth daily.     aspirin 81 MG tablet  Take 81 mg by mouth daily.     atenolol 100 MG tablet  Commonly known as:  TENORMIN  Take 100 mg by mouth at bedtime.     atorvastatin 20 MG tablet  Commonly known as:  LIPITOR  Take 20 mg by mouth at bedtime.     cholecalciferol 1000 UNITS tablet  Commonly known as:  VITAMIN D  Take 1,000 Units by mouth daily.     DSS 100 MG Caps  Take 200 mg by mouth 2 (two) times daily.     ezetimibe 10 MG tablet  Commonly known as:  ZETIA  Take 10 mg by mouth at bedtime.     fenofibrate 160 MG tablet  Take 160 mg by mouth at bedtime.     gabapentin 300 MG capsule  Commonly known as:  NEURONTIN  Take 300 mg by mouth 2 (two) times daily.     hydrochlorothiazide 25 MG tablet  Commonly known as:  HYDRODIURIL  Take 25 mg by mouth daily.     insulin NPH Human 100 UNIT/ML injection  Commonly known as:  HUMULIN N,NOVOLIN N  Inject 35-40 Units into the skin 2 (two) times daily before a meal. 35 units in the morning and 40 units at bedtime.      lisinopril 40 MG tablet  Commonly known as:  PRINIVIL,ZESTRIL  Take 40 mg by mouth daily.     magnesium oxide 400 (241.3 MG) MG tablet  Commonly known as:  MAG-OX  Take 1 tablet (400 mg total) by mouth 2 (two) times daily.     metFORMIN 1000 MG tablet  Commonly known as:  GLUCOPHAGE  Take 1,000 mg by mouth 2 (two) times daily with a meal.     multivitamin with minerals tablet  Take 1 tablet by mouth daily.     oxyCODONE 5 MG immediate release tablet  Commonly known as:  Oxy IR/ROXICODONE  Take 1 tablet (5 mg total) by mouth every 4 (four) hours as needed for moderate pain.     polyethylene glycol packet  Commonly known as:  MIRALAX / GLYCOLAX  Take 17 g by mouth daily.     potassium chloride SA 20 MEQ tablet  Commonly known as:  K-DUR,KLOR-CON  Take 20 mEq by mouth 2 (two) times daily.     prochlorperazine 5 MG tablet  Commonly known as:  COMPAZINE  Take 1 tablet (5 mg total) by mouth every 8 (eight) hours as needed for nausea or vomiting.  sertraline 100 MG tablet  Commonly known as:  ZOLOFT  Take 100 mg by mouth daily.     sodium chloride 0.9 % SOLN 500 mL with vancomycin 10 G SOLR 1,500 mg  Inject 1,500 mg into the vein daily.     VITAMIN B 12 PO  Take 2,500 mg by mouth daily.     vitamin C 500 MG tablet  Commonly known as:  ASCORBIC ACID  Take 500 mg by mouth daily.         Disposition and Follow-up: discharged to home; [facility] to proviode IV vancomycin 1.5 g/d x 10 days; return to clinic 06/24/2013 as scheduled  Significant Diagnostic Studies:  Dg Chest 2 View  06/18/2013   CLINICAL DATA:  Cough.  Immunocompromised.  Ex-smoker.  EXAM: CHEST  2 VIEW  COMPARISON:  04/04/2013.  FINDINGS: Normal sized heart. Clear lungs with the exception of minimal residual linear scarring at both lateral costophrenic angles. The right subclavian port catheter tip is currently projected in the upper right atrium. Small amount of linear atelectasis or scarring in the left mid  lung zone. Thoracic spine degenerative changes and left shoulder fixation anchors.  IMPRESSION: No acute abnormality.   Electronically Signed   By: Enrique Sack M.D.   On: 06/18/2013 20:15   Ct Maxillofacial W/cm  06/18/2013   CLINICAL DATA:  80 year old male is immunocompromised due to chemotherapy for lymphoma. Nasal swelling and redness, sinus pressure. Evaluate for invasive infection. Initial encounter.  EXAM: CT MAXILLOFACIAL WITH CONTRAST  TECHNIQUE: Multidetector CT imaging of the maxillofacial structures was performed with intravenous contrast. Multiplanar CT image reconstructions were also generated. A small metallic BB was placed on the right temple in order to reliably differentiate right from left.  CONTRAST:  144m OMNIPAQUE IOHEXOL 300 MG/ML  SOLN  COMPARISON:  PET-CT 05/23/2013.  FINDINGS: Grossly negative visualized brain parenchyma. Intracranial artery dolichoectasia. Extensive Calcified atherosclerosis at the skull base. Major vascular structures at the skullbase and in the visualized neck appear patent.  Stable and negative visualized tympanic cavities and mastoids. The sphenoid sinuses are clear. Visible calvarium intact.  Mandible intact, but widespread poor dentition.  Severe soft tissue thickening at the bridge of the nose, up to 2 cm in thickness. The abnormal soft tissue thickening and increased density tracks into the anterior cartilaginous nasal septum (series 3, image 43). With no discrete nasal septal abscess identified. No subcutaneous gas. No erosion of the nasal bones or maxilla.  Bilateral nasolacrimal ducts appear within normal limits. Bilateral orbits soft tissues appear within normal limits and unaffected.  Trace layering low-density fluid in the right maxillary sinus, stable. Minimal maxillary and ethmoid sinus mucosal thickening elsewhere. Frontal sinuses are clear.  Other visualized deep soft tissue spaces of the face remain within normal limits.  IMPRESSION: 1. Severe nasal  cellulitis, with extension into the anterior cartilaginous nasal septum - but no septal abscess at this time. Consider fungal cellulitis. No CT changes of underlying osteomyelitis. 2. Trace paranasal sinus inflammatory changes, stable since the January PET-CT. 3. Widespread poor dentition.   Electronically Signed   By: LLars PinksM.D.   On: 06/18/2013 19:26    Discharge Laboratory Values: Basic Metabolic Panel:  Recent Labs Lab 06/13/13 1244 06/17/13 1231 06/17/13 1231 06/18/13 2000 06/19/13 0630  NA 138 138  --  134* 138  K 3.1* 2.7*  --  3.3* 3.3*  CL  --   --   --  93* 98  CO2 21* 25  --  26 25  GLUCOSE 321* 233*  --  223* 107*  BUN 28.1* 17.0  --  14 10  CREATININE 1.7* 1.2  --  1.00 0.88  CALCIUM 9.2 8.3*  --  8.5 8.4  MG  --   --  1.0* 1.2*  --    GFR Estimated Creatinine Clearance: 68.1 ml/min (by C-G formula based on Cr of 0.88). Liver Function Tests:  Recent Labs Lab 06/13/13 1244 06/17/13 1231 06/18/13 2000  AST _0 ALT 55 35 37  ALKPHOS 68 83 118*  BILITOT 0.52 0.48 0.3  PROT 6.8 6.3* 7.0  ALBUMIN 4.1 3.6 3.6   No results found for this basename: LIPASE, AMYLASE,  in the last 168 hours No results found for this basename: AMMONIA,  in the last 168 hours Coagulation profile No results found for this basename: INR, PROTIME,  in the last 168 hours  CBC:  Recent Labs Lab 06/13/13 1244 06/17/13 1230 06/18/13 2000 06/19/13 0630  WBC 2.1* 20.7* 22.8* 21.7*  NEUTROABS 1.6 19.6* 21.9*  --   HGB 9.8* 9.1* 9.3* 9.1*  HCT 28.7* 26.7* 26.8* 26.6*  MCV 83.3 83.6 83.2 83.1  PLT 85* 45* 49* 50*   Cardiac Enzymes: No results found for this basename: CKTOTAL, CKMB, CKMBINDEX, TROPONINI,  in the last 168 hours BNP: No components found with this basename: POCBNP,  CBG:  Recent Labs Lab 06/19/13 0728 06/19/13 1150 06/19/13 1705 06/19/13 2111 06/20/13 0704  GLUCAP 111* 166* 175* 243* 145*   D-Dimer No results found for this basename: DDIMER,  in the  last 72 hours Hgb A1c No results found for this basename: HGBA1C,  in the last 72 hours Lipid Profile No results found for this basename: CHOL, HDL, LDLCALC, TRIG, CHOLHDL, LDLDIRECT,  in the last 72 hours Thyroid function studies No results found for this basename: TSH, T4TOTAL, FREET3, T3FREE, THYROIDAB,  in the last 72 hours Anemia work up No results found for this basename: VITAMINB12, FOLATE, FERRITIN, TIBC, IRON, RETICCTPCT,  in the last 72 hours Microbiology Recent Results (from the past 240 hour(s))  MRSA CULTURE     Status: None   Collection Time    06/17/13  2:50 PM      Result Value Ref Range Status   Staph Screen Nasal (Revised) MRSA Screen   Corrected   Comment: THIS IS A CORRECTED RESULT! Original result:  (Added) MRSA Screen------------------------------------------------------------------------NASALFinal - ===== FINAL REPORT =====Moderate METHICILLIN RESISTANT STAPHYLOCOCCUS AUREUS     Brief H and P: For complete details please refer to admission H and P, but in brief, the patient was admitted 06/18/2013 after being seen in the office the day before with a mid-face cellulitis. He received IV Rocephin 02/23 with no response, in fact with worsening erythema and edem in the mid face. He was admitted 06/18/2013 for further evaluation and IV ABX  Physical Exam at Discharge: BP 154/69  Pulse 69  Temp(Src) 98 F (36.7 C) (Oral)  Resp 15  Ht _1  (1.753 m)  Wt 185 lb 6.4 oz (84.097 kg)  BMI 27.37 kg/m2  SpO2 94% Gen: middle aged white male examined in recliner Cardiovascular: RRR, no murmur appreciated Respiratory: no rles or rhonchi Gastrointestinal: soft, NT, +BS Extremities: no peripheral edema    Hospital Course:  The patient was admitted and IV imipenem and vancomycin were started. By day 2 his nasal swab came back positive for MRSA and ID d/c'd the imipenem. The patient's condition is slowly improving and ID agreed  the pt could be discharged 06/20/2013 with  continuing IV vancomycin at home through [facility]. (Dr Baxter Flattery suggested switching to Bactrim after 5 additional days but we have scheduled the pt for 10 days IV vanco and will switch him to orals depending on response, assessed at his next visit 06/24/2013).  Active Problems:   Cellulitis diffuse, face  80 y.o. [place] man with a diagnosis of primary hepatic non-Hodgkin's lymphoma established through liver biopsy February 2006, treated with Rituxan x9 and then CHOP x6. All chemotherapy completed in 2007. Maintenance Rituxan discontinued October 2008.  (1) biopsy proven retroperitoneal recurrence documented November 2014; bone marrow biopsy 04/01/2013 negative  (2) on 04/29/2013 rceived the first of two planned cycles of R-ICE chemotherapy with a view to eventual autologous transplant at [facility]  (3) cycle 2 RICE completed on 05/17/2012  (4) PET scan performed on 05/23/2013 revealed marked partial response to chemotherapy with a retroperitoneal mass decrease in metabolic activity from SUV of 13.2 to 6.3. Right adrenal mass size and metabolic activity is resolved when compared to the previous PET scan.  (5) He was evaluated at [facility] by Dr. Tomasa Hosteller for autologuos transplant on 05/27/2013 and was quoted a 3% chance of significant heart damage and possibly death from the treatment and a 50% chance of being alive 5 years from now after transplant (versus perhaps 2 years without). Mr. Erbes will f/u with Dr. Tomasa Hosteller for transplant after 2 more cycles of the RICE chemotherapy  (6) Received third cycle of RICE chemotherapy starting 06/04/2013  (7) maxillofacial cellulitis, swab + for MRSA, IV vancomycin started 06/18/2013  Diet:  regular  Activity:  As tolerated  Condition at Discharge:   improved  Signed: Dr. Lurline Del [phone]  06/20/2013, 10:02 AM

## 2013-06-20 NOTE — Progress Notes (Addendum)
[facility]    Date of Admission:  06/18/2013   Total days of antibiotics 3        Day 3 vanco           ID: [name] is a 80 y.o. male with  Recurrent LBCL NHL s/p 3 cycles of RICE now presents with MRSA facial cellulitis  Active Problems:   Cellulitis diffuse, face    Subjective: Improved symptoms to nasal swelling but still exquisitely tender. Had runny nose this am which is now improved. afebrile  Medications:  . allopurinol  300 mg Oral Daily  . amLODipine  5 mg Oral Daily  . atenolol  100 mg Oral QHS  . atorvastatin  20 mg Oral QHS  . cholecalciferol  1,000 Units Oral Daily  . docusate sodium  200 mg Oral BID  . enoxaparin (LOVENOX) injection  40 mg Subcutaneous Q24H  . ezetimibe  10 mg Oral QHS  . fenofibrate  160 mg Oral QHS  . gabapentin  300 mg Oral BID  . hydrochlorothiazide  25 mg Oral Daily  . insulin aspart  0-15 Units Subcutaneous TID WC  . insulin NPH Human  35 Units Subcutaneous QAC breakfast  . insulin NPH Human  35 Units Subcutaneous QHS  . lisinopril  40 mg Oral Daily  . magnesium hydroxide  30 mL Oral Daily  . magnesium oxide  400 mg Oral BID  . metFORMIN  1,000 mg Oral BID WC  . pantoprazole  40 mg Oral Daily  . polyethylene glycol  17 g Oral Daily  . potassium chloride SA  20 mEq Oral BID  . sertraline  100 mg Oral Daily  . vancomycin  1,500 mg Intravenous Q24H  . vitamin C  500 mg Oral Daily    Objective: Vital signs in last 24 hours: Temp:  [98 F (36.7 C)-98.6 F (37 C)] 98 F (36.7 C) (02/26 0511) Pulse Rate:  [62-69] 69 (02/26 0511) Resp:  [15-18] 15 (02/26 0511) BP: (130-154)/(67-69) 154/69 mmHg (02/26 0511) SpO2:  [94 %-98 %] 94 % (02/26 0511)  Gen= a xo by 3 in NAD HEENT = erythamatous bulbous nose, edematous turbinates/anterior nares. Mildly injected conjunctiva. No thrush Lab Results  Recent Labs  06/18/13 2000 06/19/13 0630  WBC 22.8* 21.7*  HGB 9.3* 9.1*  HCT 26.8* 26.6*  NA 134*  138  K 3.3* 3.3*  CL 93* 98  CO2 26 25  BUN 14 10  CREATININE 1.00 0.88   Liver Panel  Recent Labs  06/17/13 1231 06/18/13 2000  PROT 6.3* 7.0  ALBUMIN 3.6 3.6  AST 24 25  ALT 35 37  ALKPHOS 83 118*  BILITOT 0.48 0.3    Studies/Results: Dg Chest 2 View  06/18/2013   CLINICAL DATA:  Cough.  Immunocompromised.  Ex-smoker.  EXAM: CHEST  2 VIEW  COMPARISON:  04/04/2013.  FINDINGS: Normal sized heart. Clear lungs with the exception of minimal residual linear scarring at both lateral costophrenic angles. The right subclavian port catheter tip is currently projected in the upper right atrium. Small amount of linear atelectasis or scarring in the left mid lung zone. Thoracic spine degenerative changes and left shoulder fixation anchors.  IMPRESSION: No acute abnormality.   Electronically Signed   By: Enrique Sack M.D.   On: 06/18/2013 20:15   Ct Maxillofacial W/cm  06/18/2013   CLINICAL DATA:  80 year old male is immunocompromised due to chemotherapy for lymphoma. Nasal swelling and redness, sinus pressure.  Evaluate for invasive infection. Initial encounter.  EXAM: CT MAXILLOFACIAL WITH CONTRAST  TECHNIQUE: Multidetector CT imaging of the maxillofacial structures was performed with intravenous contrast. Multiplanar CT image reconstructions were also generated. A small metallic BB was placed on the right temple in order to reliably differentiate right from left.  CONTRAST:  181mL OMNIPAQUE IOHEXOL 300 MG/ML  SOLN  COMPARISON:  PET-CT 05/23/2013.  FINDINGS: Grossly negative visualized brain parenchyma. Intracranial artery dolichoectasia. Extensive Calcified atherosclerosis at the skull base. Major vascular structures at the skullbase and in the visualized neck appear patent.  Stable and negative visualized tympanic cavities and mastoids. The sphenoid sinuses are clear. Visible calvarium intact.  Mandible intact, but widespread poor dentition.  Severe soft tissue thickening at the bridge of the nose,  up to 2 cm in thickness. The abnormal soft tissue thickening and increased density tracks into the anterior cartilaginous nasal septum (series 3, image 43). With no discrete nasal septal abscess identified. No subcutaneous gas. No erosion of the nasal bones or maxilla.  Bilateral nasolacrimal ducts appear within normal limits. Bilateral orbits soft tissues appear within normal limits and unaffected.  Trace layering low-density fluid in the right maxillary sinus, stable. Minimal maxillary and ethmoid sinus mucosal thickening elsewhere. Frontal sinuses are clear.  Other visualized deep soft tissue spaces of the face remain within normal limits.  IMPRESSION: 1. Severe nasal cellulitis, with extension into the anterior cartilaginous nasal septum - but no septal abscess at this time. Consider fungal cellulitis. No CT changes of underlying osteomyelitis. 2. Trace paranasal sinus inflammatory changes, stable since the January PET-CT. 3. Widespread poor dentition.   Electronically Signed   By: Lars Pinks M.D.   On: 06/18/2013 19:26     Assessment/Plan: MRSA facial/nasal cellulitis = recommend to keep on vancomycin for the next 5-7 days, through  Monday, march 2nd then switch to oral doxycycline 100mg  BID for addn 7 days. Please have home health check bmp and vanco trough on 2/27 or 2/28. vanco trough gaol of 10-15.   We will have him be seen in ID clinic early next week to see how he is doing and transition to oral antibiotics  Henritta Mutz, [facility] Infectious Diseases Cell: [phone] Pager: [phone]  06/20/2013, 11:00 AM

## 2013-06-20 NOTE — Care Management Note (Signed)
Cm spoke with patient at the bedside concerning discharge planning. Md orders for IV ABX x 10 days. Per pt choice [facility] to provide [facility] services. Pt states has three adult daughters whom will assist with home care. Pt's daughter to provide tx home. [facility] infusion RN to provide IV ABX teaching at bedside prior to discharge.    Venita Lick Aragon Scarantino,MSN,RN [phone]

## 2013-06-21 DIAGNOSIS — C8589 Other specified types of non-Hodgkin lymphoma, extranodal and solid organ sites: Secondary | ICD-10-CM | POA: Diagnosis not present

## 2013-06-21 DIAGNOSIS — T451X5A Adverse effect of antineoplastic and immunosuppressive drugs, initial encounter: Secondary | ICD-10-CM | POA: Diagnosis not present

## 2013-06-21 DIAGNOSIS — L0201 Cutaneous abscess of face: Secondary | ICD-10-CM | POA: Diagnosis not present

## 2013-06-21 DIAGNOSIS — E876 Hypokalemia: Secondary | ICD-10-CM | POA: Diagnosis not present

## 2013-06-21 DIAGNOSIS — Z452 Encounter for adjustment and management of vascular access device: Secondary | ICD-10-CM | POA: Diagnosis not present

## 2013-06-21 DIAGNOSIS — A4902 Methicillin resistant Staphylococcus aureus infection, unspecified site: Secondary | ICD-10-CM | POA: Diagnosis not present

## 2013-06-21 DIAGNOSIS — L03211 Cellulitis of face: Secondary | ICD-10-CM | POA: Diagnosis not present

## 2013-06-21 DIAGNOSIS — E119 Type 2 diabetes mellitus without complications: Secondary | ICD-10-CM | POA: Diagnosis not present

## 2013-06-21 DIAGNOSIS — D6481 Anemia due to antineoplastic chemotherapy: Secondary | ICD-10-CM | POA: Diagnosis not present

## 2013-06-21 DIAGNOSIS — D6959 Other secondary thrombocytopenia: Secondary | ICD-10-CM | POA: Diagnosis not present

## 2013-06-24 ENCOUNTER — Other Ambulatory Visit: Payer: Medicare Other

## 2013-06-24 ENCOUNTER — Ambulatory Visit: Payer: Medicare Other | Admitting: Physician Assistant

## 2013-06-24 ENCOUNTER — Other Ambulatory Visit: Payer: Self-pay

## 2013-06-24 ENCOUNTER — Ambulatory Visit: Payer: Medicare Other | Admitting: Internal Medicine

## 2013-06-25 ENCOUNTER — Other Ambulatory Visit: Payer: Self-pay | Admitting: Oncology

## 2013-06-25 ENCOUNTER — Telehealth: Payer: Self-pay | Admitting: *Deleted

## 2013-06-25 ENCOUNTER — Other Ambulatory Visit: Payer: Self-pay | Admitting: *Deleted

## 2013-06-25 DIAGNOSIS — D6481 Anemia due to antineoplastic chemotherapy: Secondary | ICD-10-CM | POA: Diagnosis not present

## 2013-06-25 DIAGNOSIS — C8589 Other specified types of non-Hodgkin lymphoma, extranodal and solid organ sites: Secondary | ICD-10-CM | POA: Diagnosis not present

## 2013-06-25 DIAGNOSIS — L0201 Cutaneous abscess of face: Secondary | ICD-10-CM | POA: Diagnosis not present

## 2013-06-25 DIAGNOSIS — C8588 Other specified types of non-Hodgkin lymphoma, lymph nodes of multiple sites: Secondary | ICD-10-CM

## 2013-06-25 DIAGNOSIS — E119 Type 2 diabetes mellitus without complications: Secondary | ICD-10-CM | POA: Diagnosis not present

## 2013-06-25 DIAGNOSIS — A4902 Methicillin resistant Staphylococcus aureus infection, unspecified site: Secondary | ICD-10-CM | POA: Diagnosis not present

## 2013-06-25 DIAGNOSIS — L0291 Cutaneous abscess, unspecified: Secondary | ICD-10-CM | POA: Diagnosis not present

## 2013-06-25 DIAGNOSIS — Z452 Encounter for adjustment and management of vascular access device: Secondary | ICD-10-CM | POA: Diagnosis not present

## 2013-06-25 NOTE — Telephone Encounter (Signed)
This RN spoke with pt's daughter per concern due to missed appointment yesterday. [name] states pt is receiving home IV Vancomycin and it runs from 2-430pm daily and cannot be changed.  This RN informed her MD does not need to see pt this week if nose is looking better- which [name] states " yes it is much better".  Pt has 6 more days of IV vancomycin.  Per MD Bmet will be obtained via home health.  Appointment is being made for follow up next week with MD.

## 2013-06-26 ENCOUNTER — Telehealth: Payer: Self-pay | Admitting: Oncology

## 2013-06-26 ENCOUNTER — Ambulatory Visit (INDEPENDENT_AMBULATORY_CARE_PROVIDER_SITE_OTHER): Payer: Medicare Other | Admitting: Internal Medicine

## 2013-06-26 ENCOUNTER — Encounter: Payer: Self-pay | Admitting: Internal Medicine

## 2013-06-26 VITALS — BP 122/69 | HR 74 | Temp 97.6°F | Wt 188.0 lb

## 2013-06-26 DIAGNOSIS — D6959 Other secondary thrombocytopenia: Secondary | ICD-10-CM | POA: Diagnosis not present

## 2013-06-26 DIAGNOSIS — K121 Other forms of stomatitis: Secondary | ICD-10-CM | POA: Diagnosis not present

## 2013-06-26 DIAGNOSIS — J3489 Other specified disorders of nose and nasal sinuses: Secondary | ICD-10-CM | POA: Diagnosis not present

## 2013-06-26 DIAGNOSIS — E876 Hypokalemia: Secondary | ICD-10-CM | POA: Diagnosis not present

## 2013-06-26 DIAGNOSIS — D72829 Elevated white blood cell count, unspecified: Secondary | ICD-10-CM | POA: Diagnosis not present

## 2013-06-26 DIAGNOSIS — C8583 Other specified types of non-Hodgkin lymphoma, intra-abdominal lymph nodes: Secondary | ICD-10-CM | POA: Diagnosis not present

## 2013-06-26 DIAGNOSIS — E860 Dehydration: Secondary | ICD-10-CM | POA: Diagnosis not present

## 2013-06-26 DIAGNOSIS — A4902 Methicillin resistant Staphylococcus aureus infection, unspecified site: Secondary | ICD-10-CM | POA: Diagnosis not present

## 2013-06-26 DIAGNOSIS — I2510 Atherosclerotic heart disease of native coronary artery without angina pectoris: Secondary | ICD-10-CM

## 2013-06-26 DIAGNOSIS — J329 Chronic sinusitis, unspecified: Secondary | ICD-10-CM | POA: Diagnosis not present

## 2013-06-26 DIAGNOSIS — Z8546 Personal history of malignant neoplasm of prostate: Secondary | ICD-10-CM | POA: Diagnosis not present

## 2013-06-26 MED ORDER — DOXYCYCLINE HYCLATE 100 MG PO TABS: 100.0000 mg | ORAL_TABLET | Freq: Two times a day (BID) | ORAL | Status: DC

## 2013-06-26 NOTE — Progress Notes (Signed)
Subjective:    Patient ID: [name], male    DOB: 07-21-1931, 80 y.o.   MRN: [id number]  HPI 80yo M with  with Recurrent LBCL NHL s/p 3 cycles of RICE now presents with MRSA facial cellulitis, hospitalized on 2/24, placed on vancomycin. Now on Day #9 of vancomycin. Still has some swelling and erythema. Much improved. No fevers. Has runny nose but no blood or purulent drainage. No rash no diarrhea associated with vancomycin. He typically has 2-3 BM at baseline.  Current Outpatient Prescriptions on File Prior to Visit  Medication Sig Dispense Refill  . allopurinol (ZYLOPRIM) 300 MG tablet Take 1 tablet (300 mg total) by mouth daily.  30 tablet  3  . amLODipine (NORVASC) 5 MG tablet Take 5 mg by mouth daily.      Marland Kitchen aspirin 81 MG tablet Take 81 mg by mouth daily.      Marland Kitchen atenolol (TENORMIN) 100 MG tablet Take 100 mg by mouth at bedtime.      Marland Kitchen atorvastatin (LIPITOR) 20 MG tablet Take 20 mg by mouth at bedtime.      . cholecalciferol (VITAMIN D) 1000 UNITS tablet Take 1,000 Units by mouth daily.      . Cyanocobalamin (VITAMIN B 12 PO) Take 2,500 mg by mouth daily.       Marland Kitchen docusate sodium 100 MG CAPS Take 200 mg by mouth 2 (two) times daily.  60 capsule  0  . ezetimibe (ZETIA) 10 MG tablet Take 10 mg by mouth at bedtime.      . fenofibrate 160 MG tablet Take 160 mg by mouth at bedtime.      . gabapentin (NEURONTIN) 300 MG capsule Take 300 mg by mouth 2 (two) times daily.      . hydrochlorothiazide (HYDRODIURIL) 25 MG tablet Take 25 mg by mouth daily.      . insulin NPH Human (HUMULIN N,NOVOLIN N) 100 UNIT/ML injection Inject 35-40 Units into the skin 2 (two) times daily before a meal. 35 units in the morning and 40 units at bedtime.      Marland Kitchen lisinopril (PRINIVIL,ZESTRIL) 40 MG tablet Take 40 mg by mouth daily.      . magnesium oxide (MAG-OX) 400 (241.3 MG) MG tablet Take 1 tablet (400 mg total) by mouth 2 (two) times daily.  60 tablet  1  . metFORMIN (GLUCOPHAGE) 1000 MG tablet Take 1,000  mg by mouth 2 (two) times daily with a meal.      . Multiple Vitamins-Minerals (MULTIVITAMIN WITH MINERALS) tablet Take 1 tablet by mouth daily.      Marland Kitchen oxyCODONE (OXY IR/ROXICODONE) 5 MG immediate release tablet Take 1 tablet (5 mg total) by mouth every 4 (four) hours as needed for moderate pain.  30 tablet  0  . polyethylene glycol (MIRALAX / GLYCOLAX) packet Take 17 g by mouth daily.  14 each  0  . potassium chloride SA (K-DUR,KLOR-CON) 20 MEQ tablet Take 20 mEq by mouth 2 (two) times daily.      . prochlorperazine (COMPAZINE) 5 MG tablet Take 1 tablet (5 mg total) by mouth every 8 (eight) hours as needed for nausea or vomiting.  30 tablet  0  . sertraline (ZOLOFT) 100 MG tablet Take 100 mg by mouth daily.      . sodium chloride 0.9 % SOLN 500 mL with vancomycin 10 G SOLR 1,500 mg Inject 1,500 mg into the vein daily.  10 Units  0  . vitamin C (ASCORBIC ACID) 500  MG tablet Take 500 mg by mouth daily.       No current facility-administered medications on file prior to visit.   Active Ambulatory Problems    Diagnosis Date Noted  . DIABETES MELLITUS, TYPE II 07/28/2006  . HYPERLIPIDEMIA NEC/NOS 07/28/2006  . ATHEROSCLEROSIS, CORONARY, NATIVE ARTERY 07/28/2006  . GERD 07/28/2006  . CHOLELITHIASIS, WITH OBSTRUCTION 07/28/2006  . CELLULITIS, ANKLE 06/28/2006  . BACTEREMIA 06/28/2006  . OTHER POSTOPERATIVE INFECTION 07/28/2006  . HX, PERSONAL, MALIGNANCY, PROSTATE 07/28/2006  . HX, PERSONAL, MALIGNANCY, LYMPHATIC NEC 07/28/2006  . NEPHROLITHIASIS, HX OF 07/28/2006  . HX, PERSONAL, MUSCULOSKELETAL DISORD NEC 07/28/2006  . Cholelithiasis with cholecystitis 03/13/2013  . Lymphadenopathy, retroperitoneal 03/13/2013  . Lymphoma   . Cholecystitis with cholelithiasis 04/04/2013  . NHL (non-Hodgkin's lymphoma) 04/29/2013  . Anemia in neoplastic disease 05/13/2013  . Thrombocytopenia, unspecified 05/13/2013  . Lymphoma malignant, large cell 05/14/2013  . DM (diabetes mellitus) type 2,  uncontrolled, with ketoacidosis 06/07/2013  . Hypokalemia 06/17/2013  . Facial pain 06/17/2013  . Cellulitis diffuse, face 06/18/2013   Resolved Ambulatory Problems    Diagnosis Date Noted  . No Resolved Ambulatory Problems   Past Medical History  Diagnosis Date  . Hypertension   . Cancer of liver   . Kidney stone   . Prostate cancer   . Skin cancer   . Arthritis   . Anemia   . GERD (gastroesophageal reflux disease)   . [name]   . Sleep apnea   . Neuropathy in diabetes   . Diabetes mellitus        Review of Systems 10 point ROS is negative except what is mentioned in hpi    Objective:   Physical Exam BP 122/69  Pulse 74  Temp(Src) 97.6 F (36.4 C) (Oral)  Wt 188 lb (85.276 kg) gen = a x o by 3  heent = much improved in erythema and swelling, although base of nose and ant nares still erythematous and edematous       Assessment & Plan:  MRSA cellulitis/SSTI= will extend Iv vanco for a total of 14 days to take through march 9th. Will convert to taking doxycycline 100mg  BID x 10 day to start on march 10th. Will need blood work on march 9th.  rtc in 2 wk

## 2013-06-26 NOTE — Telephone Encounter (Signed)
lvm for pt daughter regaring to 3.10.15 for father....mailed pt appt sched/avs adn letter

## 2013-06-27 ENCOUNTER — Telehealth: Payer: Self-pay | Admitting: *Deleted

## 2013-06-27 NOTE — Telephone Encounter (Signed)
This RN received a call from Trace Hening PA with Dr Brooke Dare at [facility] stating per their contact with pt his choice at this time " is to not proceed with transplant ".  Trace wanted Dr Jannifer Rodney to be aware of above.  Return call number for above given as [phone].  This note will be given to MD.

## 2013-06-28 DIAGNOSIS — E1139 Type 2 diabetes mellitus with other diabetic ophthalmic complication: Secondary | ICD-10-CM | POA: Diagnosis not present

## 2013-06-28 DIAGNOSIS — H00029 Hordeolum internum unspecified eye, unspecified eyelid: Secondary | ICD-10-CM | POA: Diagnosis not present

## 2013-06-28 DIAGNOSIS — E11329 Type 2 diabetes mellitus with mild nonproliferative diabetic retinopathy without macular edema: Secondary | ICD-10-CM | POA: Diagnosis not present

## 2013-06-28 DIAGNOSIS — H2510 Age-related nuclear cataract, unspecified eye: Secondary | ICD-10-CM | POA: Diagnosis not present

## 2013-06-28 DIAGNOSIS — H524 Presbyopia: Secondary | ICD-10-CM | POA: Diagnosis not present

## 2013-07-01 DIAGNOSIS — D6481 Anemia due to antineoplastic chemotherapy: Secondary | ICD-10-CM | POA: Diagnosis not present

## 2013-07-01 DIAGNOSIS — A4902 Methicillin resistant Staphylococcus aureus infection, unspecified site: Secondary | ICD-10-CM | POA: Diagnosis not present

## 2013-07-01 DIAGNOSIS — Z452 Encounter for adjustment and management of vascular access device: Secondary | ICD-10-CM | POA: Diagnosis not present

## 2013-07-01 DIAGNOSIS — C8589 Other specified types of non-Hodgkin lymphoma, extranodal and solid organ sites: Secondary | ICD-10-CM | POA: Diagnosis not present

## 2013-07-01 DIAGNOSIS — L0201 Cutaneous abscess of face: Secondary | ICD-10-CM | POA: Diagnosis not present

## 2013-07-01 DIAGNOSIS — E119 Type 2 diabetes mellitus without complications: Secondary | ICD-10-CM | POA: Diagnosis not present

## 2013-07-02 ENCOUNTER — Ambulatory Visit (HOSPITAL_BASED_OUTPATIENT_CLINIC_OR_DEPARTMENT_OTHER): Payer: Medicare Other | Admitting: Oncology

## 2013-07-02 ENCOUNTER — Telehealth: Payer: Self-pay | Admitting: *Deleted

## 2013-07-02 ENCOUNTER — Telehealth: Payer: Self-pay | Admitting: Oncology

## 2013-07-02 ENCOUNTER — Other Ambulatory Visit (HOSPITAL_BASED_OUTPATIENT_CLINIC_OR_DEPARTMENT_OTHER): Payer: Medicare Other

## 2013-07-02 VITALS — BP 105/59 | HR 62 | Temp 98.5°F | Resp 20 | Ht 69.0 in | Wt 191.7 lb

## 2013-07-02 DIAGNOSIS — C8583 Other specified types of non-Hodgkin lymphoma, intra-abdominal lymph nodes: Secondary | ICD-10-CM | POA: Diagnosis not present

## 2013-07-02 DIAGNOSIS — C8588 Other specified types of non-Hodgkin lymphoma, lymph nodes of multiple sites: Secondary | ICD-10-CM

## 2013-07-02 DIAGNOSIS — Z8546 Personal history of malignant neoplasm of prostate: Secondary | ICD-10-CM

## 2013-07-02 DIAGNOSIS — Z8614 Personal history of Methicillin resistant Staphylococcus aureus infection: Secondary | ICD-10-CM | POA: Diagnosis not present

## 2013-07-02 DIAGNOSIS — L03211 Cellulitis of face: Secondary | ICD-10-CM

## 2013-07-02 DIAGNOSIS — Z87898 Personal history of other specified conditions: Secondary | ICD-10-CM

## 2013-07-02 DIAGNOSIS — R590 Localized enlarged lymph nodes: Secondary | ICD-10-CM

## 2013-07-02 DIAGNOSIS — C8589 Other specified types of non-Hodgkin lymphoma, extranodal and solid organ sites: Secondary | ICD-10-CM | POA: Diagnosis not present

## 2013-07-02 DIAGNOSIS — C8590 Non-Hodgkin lymphoma, unspecified, unspecified site: Secondary | ICD-10-CM

## 2013-07-02 DIAGNOSIS — E119 Type 2 diabetes mellitus without complications: Secondary | ICD-10-CM

## 2013-07-02 LAB — COMPREHENSIVE METABOLIC PANEL (CC13)
ALK PHOS: 57 U/L (ref 40–150)
ALT: 30 U/L (ref 0–55)
AST: 23 U/L (ref 5–34)
Albumin: 3.8 g/dL (ref 3.5–5.0)
Anion Gap: 10 mEq/L (ref 3–11)
BUN: 22.1 mg/dL (ref 7.0–26.0)
CALCIUM: 9.6 mg/dL (ref 8.4–10.4)
CHLORIDE: 103 meq/L (ref 98–109)
CO2: 21 mEq/L — ABNORMAL LOW (ref 22–29)
Creatinine: 1.3 mg/dL (ref 0.7–1.3)
Glucose: 161 mg/dl — ABNORMAL HIGH (ref 70–140)
POTASSIUM: 4.7 meq/L (ref 3.5–5.1)
Sodium: 135 mEq/L — ABNORMAL LOW (ref 136–145)
Total Bilirubin: 0.41 mg/dL (ref 0.20–1.20)
Total Protein: 6.7 g/dL (ref 6.4–8.3)

## 2013-07-02 LAB — CBC WITH DIFFERENTIAL/PLATELET
BASO%: 0.2 % (ref 0.0–2.0)
Basophils Absolute: 0 10*3/uL (ref 0.0–0.1)
EOS%: 1.6 % (ref 0.0–7.0)
Eosinophils Absolute: 0.1 10*3/uL (ref 0.0–0.5)
HCT: 26.1 % — ABNORMAL LOW (ref 38.4–49.9)
HGB: 8.9 g/dL — ABNORMAL LOW (ref 13.0–17.1)
LYMPH#: 0.3 10*3/uL — AB (ref 0.9–3.3)
LYMPH%: 4.4 % — ABNORMAL LOW (ref 14.0–49.0)
MCH: 29.9 pg (ref 27.2–33.4)
MCHC: 34.1 g/dL (ref 32.0–36.0)
MCV: 87.9 fL (ref 79.3–98.0)
MONO#: 0.8 10*3/uL (ref 0.1–0.9)
MONO%: 10.1 % (ref 0.0–14.0)
NEUT#: 6.7 10*3/uL — ABNORMAL HIGH (ref 1.5–6.5)
NEUT%: 83.7 % — ABNORMAL HIGH (ref 39.0–75.0)
Platelets: 188 10*3/uL (ref 140–400)
RBC: 2.97 10*6/uL — ABNORMAL LOW (ref 4.20–5.82)
RDW: 23.1 % — ABNORMAL HIGH (ref 11.0–14.6)
WBC: 8 10*3/uL (ref 4.0–10.3)

## 2013-07-02 LAB — LACTATE DEHYDROGENASE (CC13): LDH: 164 U/L (ref 125–245)

## 2013-07-02 LAB — HOLD TUBE, BLOOD BANK

## 2013-07-02 NOTE — Telephone Encounter (Signed)
, °

## 2013-07-02 NOTE — Telephone Encounter (Signed)
Labs received from [facility] from 07/01/13, showing Hgb=8.1. Will add labs today in the event patient is symptomatic and will need transfusion this week.

## 2013-07-02 NOTE — Progress Notes (Signed)
ID: [name] OB: 08-31-1931  MR#: [id number]  VZD#:[id number]  PCP: Horatio Pel, MD GYN:   SU: [name] OTHER MD: [name], [name], [name]  CHIEF COMPLAINT:  "I finished my antibiotic by vein"   NON_HODGKIN"S LYMPHOMA HISTORY::  The patient developed right upper quadrant pain last year and he had an ultrasound April 5th which showed some gallstones without evidence of cholecystitis or ductal dilatation.  However, there were multiple lesions in the liver which could not be assessed further.  Accordingly on July 31, 2003, [name], showed multiple liver masses, more than 25, most over 1 cm, the largest being in the inferior right lobe, measuring 4.2 cm. There was also a small mass in the spleen.  CT of the pelvis was unremarkable.   The patient had a biopsy of the liver August 01, 2003.  The report says only that it was "a lesion in the right lobe of the liver".  Presumably this was the largest lesion present.  The final pathology [id number] and [id number]) showed only cirrhosis.     The patient has been followed by [name], and a repeat CT scan of the abdomen and pelvis was obtained May 18, 2004.  Many of the liver lesions seen previously had actually decreased in size.  However, [name], [name], a second ultrasound-guided biopsy was performed 06/11/04. This was a lesion deep in the left lobe of the liver and therefore, I would think not the same one previously biopsied which was in the right liver. The pathology this time [id number]) shows a poorly differentiated neuroendocrine carcinoma which was  positive for chromogranin A, negative for synaptophysin, thyroid transcription factor, a variety of cytokeratins, [name], alpha-fetoprotein and COX-2."  Mr. [name] was subsequently evaluated at [facility] by Dr. Otelia Limes and repeat liver biopsy and review of the earlier biopsy here showed a primary hepatic lymphoma. The patient was treated with R-CHOP as detailed below and achieved a complete response. He received maintenance rituximab until 2008  More recently the patient has been complaining of worsening back pain. It felt "like a kidney stone".  INTERVAL HISTORY. AC returns today with his daughter and granddaughter for followup of his breast cancer. Since his last visit here of course he was admitted with a facial cellulitis which proved to be MRSA positive. He received a total of 14 days of intravenous vancomycin and today it was started by Dr. Baxter Flattery on doxycycline, which you will continued for an additional 2 weeks.  Quiet irrespective of the infectious problem, the patient has made a definite decision he does not want to proceed to autologous stem cell transplant at [facility]. At [facility] is aware and they are no longer expecting to see him.   REVIEW OF SYSTEMS:  AC now lives alone with his dog. 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 His granddaughter lives right behind them and she is making sure he gets his insulin and drinks his 2 quarts of fluid a day. He has had no fevers, rash,  sweats, weight loss. Or any adenopathy that he is aware of. He is minimally fatigued and is looking forward to starting to work in his garden as soon as the weather improves. He has cataracts. His bowel movements became a little looser with the antibiotics and increased water intake. In the morning his bladder is very 4, to the point that it is uncomfortable in his right flank. Assess your next that resolves. He tells me her sugars are "well-controlled". He tolerated  the 2 weeks of vancomycin with no side effects that he is aware of. A detailed review of systems was otherwise noncontributory  PAST MEDICAL HISTORY: Past Medical History  Diagnosis Date  . Hypertension   . Cancer of liver   . Kidney stone   . Prostate cancer   . Skin cancer   . Arthritis   . Anemia   . GERD (gastroesophageal reflux disease)   . Lymphoma   . [name]   . Sleep apnea   . Neuropathy in diabetes     Hx: of  . Diabetes mellitus     INSULIN DEPENDENT  . HX, PERSONAL, MALIGNANCY, PROSTATE 07/28/2006    Annotation: 2001, resected Qualifier: Diagnosis of  By: [name], [name]    Significant for prostate cancer, the patient undergoing prostatectomy January 2001 under [facility] for a Gleason 7, [name], minor coronary artery disease, hypertension, hypertriglyceridemia, cholelithiasis, colon polyps, cirrhosis by biopsy, diabetes, [name], history of nephrolithiasis, status post right renal surgery and history of left rotator cuff repair under [name].    PAST SURGICAL HISTORY: Past Surgical History  Procedure Laterality Date  . Prostatectomy    . Kidney stone surgery    . Rotator cuff repair    . Ankle surgery    . Cardiac catheterization      Hx: of 1970's  . Colonoscopy      Hx: of  . Infusion port  04/04/2013    RIGHT SUBCLAVIAN  . Cholecystectomy  04/04/2013  . Cholecystectomy N/A 04/04/2013    Procedure: LAPAROSCOPIC CHOLECYSTECTOMY WITH INTRAOPERATIVE CHOLANGIOGRAM;  [name], MD;  Location: [place];  Service: General;  [name];  . Portacath placement N/A 04/04/2013    Procedure: INSERTION PORT-A-CATH;  [name], MD;  Location: [place];  Service: General;  [name];    FAMILY  HISTORY Family History  Problem Relation Age of Onset  . Heart disease Father   The patient's father died at the age of 49 from an MI.  The patient's mother died from "old age" at 44.  The patient is one of nine siblings.  One brother died from cancer of the esophagus, 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, [name], a homemaker, is present today as are the patient's three daughters: [name] is an Scientist, physiological for [facility] [employer]; [name], [name]; and [name] is Environmental consultant.  Everybody lives in [place].  The patient has five grandchildren.  He is a member of [place].  [employer].    ADVANCED DIRECTIVES: in place   HEALTH MAINTENANCE: History  Substance Use Topics  . Smoking status: Former Smoker    Types: Cigarettes, Pipe, Landscape architect  . Smokeless tobacco: Never Used     Comment: QUIT SMOKING MANY YEARS AGO "  . Alcohol Use: No     Colonoscopy:  PSA: 8.01 (NOV 2014)  Bone density:  Lipid panel:  Allergies  Allergen Reactions  . Niacin Other (See Comments)    headaches    Current Outpatient Prescriptions  Medication Sig Dispense Refill  . allopurinol (ZYLOPRIM) 300 MG tablet Take 1 tablet (300 mg total) by mouth daily.  30 tablet  3  . amLODipine (NORVASC) 5 MG tablet Take 5 mg by mouth daily.      Marland Kitchen aspirin 81 MG tablet Take 81 mg by mouth daily.      Marland Kitchen atenolol (TENORMIN) 100 MG tablet Take 100 mg by mouth at bedtime.      Marland Kitchen atorvastatin (LIPITOR) 20 MG tablet Take 20 mg by mouth at bedtime.      . cholecalciferol (VITAMIN D) 1000 UNITS tablet Take 1,000 Units by mouth daily.      . Cyanocobalamin (VITAMIN B 12 PO) Take 2,500 mg by mouth  daily.       Marland Kitchen docusate sodium 100 MG CAPS Take 200 mg by mouth 2 (two) times daily.  60 capsule  0  . doxycycline (VIBRA-TABS) 100 MG tablet Take 1 tablet (100 mg total) by mouth 2 (two) times daily.  20 tablet  0  . ezetimibe (ZETIA) 10 MG tablet Take 10 mg by mouth at bedtime.      . fenofibrate 160 MG tablet Take 160 mg by mouth at bedtime.      . gabapentin (NEURONTIN) 300 MG capsule Take 300 mg by mouth 2 (two) times daily.      . hydrochlorothiazide (HYDRODIURIL) 25 MG tablet Take 25 mg by mouth daily.      . insulin NPH Human (HUMULIN N,NOVOLIN N) 100 UNIT/ML injection Inject 35-40 Units into the skin 2 (two) times daily before a meal. 35 units in the morning and 40 units at bedtime.      Marland Kitchen lisinopril (PRINIVIL,ZESTRIL) 40 MG tablet Take 40 mg by mouth daily.      . magnesium oxide (MAG-OX) 400 (241.3 MG) MG tablet Take 1 tablet (400 mg total) by mouth 2 (two) times daily.  60 tablet  1  . metFORMIN (GLUCOPHAGE) 1000 MG tablet Take 1,000 mg by mouth 2 (two) times daily with a meal.      . Multiple Vitamins-Minerals (MULTIVITAMIN WITH MINERALS) tablet Take 1 tablet by mouth daily.      Marland Kitchen oxyCODONE (OXY IR/ROXICODONE) 5 MG immediate release tablet Take 1 tablet (5 mg total) by mouth every 4 (four) hours as needed for moderate pain.  30 tablet  0  . polyethylene glycol (MIRALAX / GLYCOLAX) packet Take 17 g by mouth daily.  14 each  0  . potassium chloride SA (K-DUR,KLOR-CON) 20 MEQ tablet Take 20 mEq by mouth 2 (two) times daily.      . prochlorperazine (COMPAZINE) 5 MG tablet Take 1 tablet (5 mg total) by mouth every 8 (eight) hours as needed for nausea or vomiting.  30 tablet  0  . sertraline (ZOLOFT) 100 MG tablet Take 100 mg by mouth daily.      . sodium chloride 0.9 % SOLN 500 mL with vancomycin 10 G SOLR 1,500 mg  Inject 1,500 mg into the vein daily.  10 Units  0  . vitamin C (ASCORBIC ACID) 500 MG tablet Take 500 mg by mouth daily.       No current facility-administered medications for  this visit.    Objective: Middle-aged white male in no acute distress  Filed Vitals:   07/02/13 1351  BP: 105/59  Pulse: 62  Temp: 98.5 F (36.9 C)  Resp: 20     Body mass index is 28.3 kg/(m^2).      ECOG FS:1 - Symptomatic but completely ambulatory  HEENT PERRLA, sclerae anicteric , pupils round and equal Ear-nose-throat: The nose is no longer erythematous or swollen. It is perhaps slightly duskier then 2 months ago, but otherwise unremarkable. The oropharynx is clear Lymphatic: No cervical or supraclavicular adenopathy; no axillary adenopathy  Lungs no rales or rhonchi, good excursion bilaterally Heart regular rate and rhythm, no murmur appreciated Abdomen:  soft, nontender, positive bowel sounds, MSK no focal spinal tenderness, no joint edema Neuro: non-focal, well-oriented,  positive  affect  LAB RESULTS:  CMP     Component Value Date/Time   NA 135* 07/02/2013 1335   NA 138 06/19/2013 0630   K 4.7 07/02/2013 1335   K 3.3* 06/19/2013 0630   CL 98 06/19/2013 0630   CO2 21* 07/02/2013 1335   CO2 25 06/19/2013 0630   GLUCOSE 161* 07/02/2013 1335   GLUCOSE 107* 06/19/2013 0630   BUN 22.1 07/02/2013 1335   BUN 10 06/19/2013 0630   CREATININE 1.3 07/02/2013 1335   CREATININE 0.88 06/19/2013 0630   CALCIUM 9.6 07/02/2013 1335   CALCIUM 8.4 06/19/2013 0630   CALCIUM 11.0* 08/17/2005 0842   PROT 6.7 07/02/2013 1335   PROT 7.0 06/18/2013 2000   ALBUMIN 3.8 07/02/2013 1335   ALBUMIN 3.6 06/18/2013 2000   AST 23 07/02/2013 1335   AST 25 06/18/2013 2000   ALT 30 07/02/2013 1335   ALT 37 06/18/2013 2000   ALKPHOS 57 07/02/2013 1335   ALKPHOS 118* 06/18/2013 2000   BILITOT 0.41 07/02/2013 1335   BILITOT 0.3 06/18/2013 2000   GFRNONAA 80* 06/19/2013 0630   GFRAA >90 06/19/2013 0630    No results found for this basename: SPEP,  UPEP,   kappa and lambda light chains    Lab Results  Component Value Date   WBC 8.0 07/02/2013   NEUTROABS 6.7* 07/02/2013   HGB 8.9* 07/02/2013   HCT 26.1* 07/02/2013    MCV 87.9 07/02/2013   PLT 188 07/02/2013      Chemistry      Component Value Date/Time   NA 135* 07/02/2013 1335   NA 138 06/19/2013 0630   K 4.7 07/02/2013 1335   K 3.3* 06/19/2013 0630   CL 98 06/19/2013 0630   CO2 21* 07/02/2013 1335   CO2 25 06/19/2013 0630   BUN 22.1 07/02/2013 1335   BUN 10 06/19/2013 0630   CREATININE 1.3 07/02/2013 1335   CREATININE 0.88 06/19/2013 0630      Component Value Date/Time   CALCIUM 9.6 07/02/2013 1335   CALCIUM 8.4 06/19/2013 0630   CALCIUM 11.0* 08/17/2005 0842   ALKPHOS 57 07/02/2013 1335   ALKPHOS 118* 06/18/2013 2000   AST 23 07/02/2013 1335   AST 25 06/18/2013 2000   ALT 30 07/02/2013 1335   ALT 37 06/18/2013 2000   BILITOT 0.41 07/02/2013 1335   BILITOT 0.3 06/18/2013 2000      Urinalysis    Component Value Date/Time   COLORURINE YELLOW  06/03/2013 2120   APPEARANCEUR CLEAR 06/03/2013 2120   LABSPEC 1.015 06/03/2013 2120   PHURINE 5.5 06/03/2013 2120   GLUCOSEU NEGATIVE 06/03/2013 2120   HGBUR NEGATIVE 06/03/2013 2120   BILIRUBINUR NEGATIVE 06/03/2013 2120   KETONESUR NEGATIVE 06/03/2013 2120   PROTEINUR NEGATIVE 06/03/2013 2120   UROBILINOGEN 0.2 06/03/2013 2120   NITRITE NEGATIVE 06/03/2013 2120   LEUKOCYTESUR NEGATIVE 06/03/2013 2120    STUDIES: Dg Chest 2 View  06/18/2013   CLINICAL DATA:  Cough.  Immunocompromised.  Ex-smoker.  EXAM: CHEST  2 VIEW  COMPARISON:  04/04/2013.  FINDINGS: Normal sized heart. Clear lungs with the exception of minimal residual linear scarring at both lateral costophrenic angles. The right subclavian port catheter tip is currently projected in the upper right atrium. Small amount of linear atelectasis or scarring in the left mid lung zone. Thoracic spine degenerative changes and left shoulder fixation anchors.  IMPRESSION: No acute abnormality.   Electronically Signed   By: Enrique Sack M.D.   On: 06/18/2013 20:15   Ct Maxillofacial W/cm  06/18/2013   CLINICAL DATA:  80 year old male is immunocompromised due to chemotherapy for  lymphoma. Nasal swelling and redness, sinus pressure. Evaluate for invasive infection. Initial encounter.  EXAM: CT MAXILLOFACIAL WITH CONTRAST  TECHNIQUE: Multidetector CT imaging of the maxillofacial structures was performed with intravenous contrast. Multiplanar CT image reconstructions were also generated. A small metallic BB was placed on the right temple in order to reliably differentiate right from left.  CONTRAST:  142mL OMNIPAQUE IOHEXOL 300 MG/ML  SOLN  COMPARISON:  PET-CT 05/23/2013.  FINDINGS: Grossly negative visualized brain parenchyma. Intracranial artery dolichoectasia. Extensive Calcified atherosclerosis at the skull base. Major vascular structures at the skullbase and in the visualized neck appear patent.  Stable and negative visualized tympanic cavities and mastoids. The sphenoid sinuses are clear. Visible calvarium intact.  Mandible intact, but widespread poor dentition.  Severe soft tissue thickening at the bridge of the nose, up to 2 cm in thickness. The abnormal soft tissue thickening and increased density tracks into the anterior cartilaginous nasal septum (series 3, image 43). With no discrete nasal septal abscess identified. No subcutaneous gas. No erosion of the nasal bones or maxilla.  Bilateral nasolacrimal ducts appear within normal limits. Bilateral orbits soft tissues appear within normal limits and unaffected.  Trace layering low-density fluid in the right maxillary sinus, stable. Minimal maxillary and ethmoid sinus mucosal thickening elsewhere. Frontal sinuses are clear.  Other visualized deep soft tissue spaces of the face remain within normal limits.  IMPRESSION: 1. Severe nasal cellulitis, with extension into the anterior cartilaginous nasal septum - but no septal abscess at this time. Consider fungal cellulitis. No CT changes of underlying osteomyelitis. 2. Trace paranasal sinus inflammatory changes, stable since the January PET-CT. 3. Widespread poor dentition.   Electronically  Signed   By: Lars Pinks M.D.   On: 06/18/2013 19:26    ASSESSMENT:  80 y.o. patient with a diagnosis of    #1 Primary hepatic non-Hodgkin's lymphoma established through liver biopsy February 2006, treated with Rituxan x9 and then CHOP x6.  All chemotherapy completed in 2007.  Maintenance Rituxan discontinued October 2008.     #2  Biopsy proven retroperitoneal recurrence documented November 2014. Bone marrow biopsy 04/01/2013 was negative   #3 status post  laparoscopic cholecystectomy and port placement on 04/04/2013.  #4 Cycle #1  RICE chemotherapy  completed on 05/02/2013.   #5 cycle#2 RICEchemotherapy  on 05/17/2013  #6 PET scan performed on 05/23/2013 revealed  marked partial response to chemotherapy with a retroperitoneal mass decrease in metabolic activity from SUV of 13.2 to 6.3. Right adrenal mass size and metabolic activity is resolved when compared to the previous PET scan.   #7 Evaluated at [facility]  Dr. Tomasa Hosteller for autologous transplant on 05/27/2013 and was quoted a 3% chance of significant heart damage and possibly death from the treatment and a 50% chance of being alive 5 years from now after transplant (versus perhaps 2 years without). After much thought the patient and family have decided firmly they do not wish to proceed to stem cell transplant.  #8) cycle 3  RICE chemotherapy completed on  06/06/2013   #9 facial cellulitis with MRSA February 2015 treated with vancomycin IV x14 days completed 07/02/2013, to be followed by doxycycline for 2 additional weeks  #10 to start maintenance Rituxan May 2015  PLAN: [name] is recovering nicely from his recent bout of MRSA. I think it would be prudent to let his immune system recover a little longer before starting maintenance rituximab. Accordingly we will start that mid may, and before that he will have a restaging PET scan.  We again went over the possibility of transplant and [facility] is very clear he really does not intend to proceed with  that. He accepts the fact that his cancer will be incurable. His goal is to be a status possible as long as possible and of course to live as long as possible. If necessary he would be willing to go through some more chemotherapy. However that decision is in the future. Right now the plan is for maintenance rituximab and he has a good understanding of the possible toxicities, side effects, and complications of that approach.   We will make sure his port is flushed every 4-6 weeks. The patient knows to call for any problems that may develop before his next visit here.  [name], MD   07/02/2013

## 2013-07-04 ENCOUNTER — Telehealth: Payer: Self-pay | Admitting: *Deleted

## 2013-07-04 NOTE — Telephone Encounter (Signed)
Patient's daughter called to let Dr. Baxter Flattery know that he is doing well. He started his oral antibiotic on 07/02/13. [name]

## 2013-07-05 LAB — BETA 2 MICROGLOBULIN, SERUM: BETA 2 MICROGLOBULIN: 5.55 mg/L — AB (ref <=2.51)

## 2013-07-10 ENCOUNTER — Encounter: Payer: Self-pay | Admitting: Internal Medicine

## 2013-07-10 ENCOUNTER — Other Ambulatory Visit: Payer: Self-pay | Admitting: *Deleted

## 2013-07-10 ENCOUNTER — Telehealth: Payer: Self-pay | Admitting: *Deleted

## 2013-07-10 ENCOUNTER — Ambulatory Visit (INDEPENDENT_AMBULATORY_CARE_PROVIDER_SITE_OTHER): Payer: Medicare Other | Admitting: Internal Medicine

## 2013-07-10 ENCOUNTER — Ambulatory Visit (HOSPITAL_BASED_OUTPATIENT_CLINIC_OR_DEPARTMENT_OTHER): Payer: Medicare Other

## 2013-07-10 VITALS — BP 90/52 | HR 58 | Temp 97.8°F | Wt 191.0 lb

## 2013-07-10 DIAGNOSIS — D72829 Elevated white blood cell count, unspecified: Secondary | ICD-10-CM | POA: Diagnosis not present

## 2013-07-10 DIAGNOSIS — K1230 Oral mucositis (ulcerative), unspecified: Secondary | ICD-10-CM | POA: Diagnosis not present

## 2013-07-10 DIAGNOSIS — D6959 Other secondary thrombocytopenia: Secondary | ICD-10-CM | POA: Diagnosis not present

## 2013-07-10 DIAGNOSIS — I2510 Atherosclerotic heart disease of native coronary artery without angina pectoris: Secondary | ICD-10-CM | POA: Diagnosis not present

## 2013-07-10 DIAGNOSIS — E876 Hypokalemia: Secondary | ICD-10-CM | POA: Diagnosis not present

## 2013-07-10 DIAGNOSIS — L0201 Cutaneous abscess of face: Secondary | ICD-10-CM

## 2013-07-10 DIAGNOSIS — Z8546 Personal history of malignant neoplasm of prostate: Secondary | ICD-10-CM | POA: Diagnosis not present

## 2013-07-10 DIAGNOSIS — C801 Malignant (primary) neoplasm, unspecified: Secondary | ICD-10-CM

## 2013-07-10 DIAGNOSIS — J3489 Other specified disorders of nose and nasal sinuses: Secondary | ICD-10-CM | POA: Diagnosis not present

## 2013-07-10 DIAGNOSIS — E860 Dehydration: Secondary | ICD-10-CM | POA: Diagnosis not present

## 2013-07-10 DIAGNOSIS — I959 Hypotension, unspecified: Secondary | ICD-10-CM | POA: Diagnosis not present

## 2013-07-10 DIAGNOSIS — K121 Other forms of stomatitis: Secondary | ICD-10-CM | POA: Diagnosis not present

## 2013-07-10 DIAGNOSIS — C8583 Other specified types of non-Hodgkin lymphoma, intra-abdominal lymph nodes: Secondary | ICD-10-CM | POA: Diagnosis not present

## 2013-07-10 DIAGNOSIS — C8590 Non-Hodgkin lymphoma, unspecified, unspecified site: Secondary | ICD-10-CM

## 2013-07-10 DIAGNOSIS — L03211 Cellulitis of face: Secondary | ICD-10-CM

## 2013-07-10 DIAGNOSIS — A4902 Methicillin resistant Staphylococcus aureus infection, unspecified site: Secondary | ICD-10-CM | POA: Diagnosis not present

## 2013-07-10 DIAGNOSIS — J329 Chronic sinusitis, unspecified: Secondary | ICD-10-CM | POA: Diagnosis not present

## 2013-07-10 MED ORDER — SODIUM CHLORIDE 0.9 % IV SOLN: Freq: Once | INTRAVENOUS | Status: DC

## 2013-07-10 MED ORDER — SODIUM CHLORIDE 0.9 % IJ SOLN
10.0000 mL | INTRAMUSCULAR | Status: DC | PRN
  Administered 2013-07-10: 10 mL via INTRAVENOUS
  Filled 2013-07-10: qty 10

## 2013-07-10 MED ORDER — SODIUM CHLORIDE 0.9 % IV SOLN
INTRAVENOUS | Status: AC
  Administered 2013-07-10: 16:00:00 via INTRAVENOUS

## 2013-07-10 MED ORDER — HEPARIN SOD (PORK) LOCK FLUSH 100 UNIT/ML IV SOLN
500.0000 [IU] | Freq: Once | INTRAVENOUS | Status: AC
  Administered 2013-07-10: 500 [IU] via INTRAVENOUS
  Filled 2013-07-10: qty 5

## 2013-07-10 NOTE — Patient Instructions (Signed)
Dehydration, Adult Dehydration is when you lose more fluids from the body than you take in. Vital organs like the kidneys, brain, and heart cannot function without a proper amount of fluids and salt. Any loss of fluids from the body can cause dehydration.  CAUSES   Vomiting.  Diarrhea.  Excessive sweating.  Excessive urine output.  Fever. SYMPTOMS  Mild dehydration  Thirst.  Dry lips.  Slightly dry mouth. Moderate dehydration  Very dry mouth.  Sunken eyes.  Skin does not bounce back quickly when lightly pinched and released.  Dark urine and decreased urine production.  Decreased tear production.  Headache. Severe dehydration  Very dry mouth.  Extreme thirst.  Rapid, weak pulse (more than 100 beats per minute at rest).  Cold hands and feet.  Not able to sweat in spite of heat and temperature.  Rapid breathing.  Blue lips.  Confusion and lethargy.  Difficulty being awakened.  Minimal urine production.  No tears. DIAGNOSIS  Your caregiver will diagnose dehydration based on your symptoms and your exam. Blood and urine tests will help confirm the diagnosis. The diagnostic evaluation should also identify the cause of dehydration. TREATMENT  Treatment of mild or moderate dehydration can often be done at home by increasing the amount of fluids that you drink. It is best to drink small amounts of fluid more often. Drinking too much at one time can make vomiting worse. Refer to the home care instructions below. Severe dehydration needs to be treated at the hospital where you will probably be given intravenous (IV) fluids that contain water and electrolytes. HOME CARE INSTRUCTIONS   Ask your caregiver about specific rehydration instructions.  Drink enough fluids to keep your urine clear or pale yellow.  Drink small amounts frequently if you have nausea and vomiting.  Eat as you normally do.  Avoid:  Foods or drinks high in sugar.  Carbonated  drinks.  Juice.  Extremely hot or cold fluids.  Drinks with caffeine.  Fatty, greasy foods.  Alcohol.  Tobacco.  Overeating.  Gelatin desserts.  Wash your hands well to avoid spreading bacteria and viruses.  Only take over-the-counter or prescription medicines for pain, discomfort, or fever as directed by your caregiver.  Ask your caregiver if you should continue all prescribed and over-the-counter medicines.  Keep all follow-up appointments with your caregiver. SEEK MEDICAL CARE IF:  You have abdominal pain and it increases or stays in one area (localizes).  You have a rash, stiff neck, or severe headache.  You are irritable, sleepy, or difficult to awaken.  You are weak, dizzy, or extremely thirsty. SEEK IMMEDIATE MEDICAL CARE IF:   You are unable to keep fluids down or you get worse despite treatment.  You have frequent episodes of vomiting or diarrhea.  You have blood or green matter (bile) in your vomit.  You have blood in your stool or your stool looks black and tarry.  You have not urinated in 6 to 8 hours, or you have only urinated a small amount of very dark urine.  You have a fever.  You faint. MAKE SURE YOU:   Understand these instructions.  Will watch your condition.  Will get help right away if you are not doing well or get worse. Document Released: 04/11/2005 Document Revised: 07/04/2011 Document Reviewed: 11/29/2010 Exit[employer] Patient Information 2014 [employer].  

## 2013-07-10 NOTE — Progress Notes (Signed)
Subjective:    Patient ID: [name], male    DOB: 1932/04/01, 80 y.o.   MRN: [id number]  HPI 80yo M with with Recurrent LBCL NHL s/p 3 cycles of RICE now presents with MRSA facial cellulitis, hospitalized on 2/24, placed on vancomycin. I saw him in consultation for his facial cellulitis. He has finished 14 days of IV vancomycin and started on oral doxycycline as of 3/10 with plan on treating with orals for  10 days. He has decided not to pursue ABMTxp at [facility]. He states the swelling, redness, and pain associated with facial cellulitis has resolved. He is doing well except suffering from seasonal allergies/rhinitis .does not suffer from rash or diarrhea from antibiotics. Did spend time gardening yesterday, "caught some color". Doing well overall. He is here with his daughter and gran-daughter. He has decided for quality of life rather than pursue more aggressive treatments for his recurrent NHL.   Current Outpatient Prescriptions on File Prior to Visit  Medication Sig Dispense Refill  . allopurinol (ZYLOPRIM) 300 MG tablet Take 1 tablet (300 mg total) by mouth daily.  30 tablet  3  . amLODipine (NORVASC) 5 MG tablet Take 5 mg by mouth daily.      Marland Kitchen aspirin 81 MG tablet Take 81 mg by mouth daily.      Marland Kitchen atenolol (TENORMIN) 100 MG tablet Take 100 mg by mouth at bedtime.      Marland Kitchen atorvastatin (LIPITOR) 20 MG tablet Take 20 mg by mouth at bedtime.      . cholecalciferol (VITAMIN D) 1000 UNITS tablet Take 1,000 Units by mouth daily.      . Cyanocobalamin (VITAMIN B 12 PO) Take 2,500 mg by mouth daily.       Marland Kitchen docusate sodium 100 MG CAPS Take 200 mg by mouth 2 (two) times daily.  60 capsule  0  . doxycycline (VIBRA-TABS) 100 MG tablet Take 1 tablet (100 mg total) by mouth 2 (two) times daily.  20 tablet  0  . ezetimibe (ZETIA) 10 MG tablet Take 10 mg by mouth at bedtime.      . fenofibrate 160 MG tablet Take 160 mg by mouth at bedtime.      . gabapentin (NEURONTIN) 300 MG capsule Take 300 mg by  mouth 2 (two) times daily.      . hydrochlorothiazide (HYDRODIURIL) 25 MG tablet Take 25 mg by mouth daily.      . insulin NPH Human (HUMULIN N,NOVOLIN N) 100 UNIT/ML injection Inject 35-40 Units into the skin 2 (two) times daily before a meal. 35 units in the morning and 40 units at bedtime.      Marland Kitchen lisinopril (PRINIVIL,ZESTRIL) 40 MG tablet Take 40 mg by mouth daily.      . magnesium oxide (MAG-OX) 400 (241.3 MG) MG tablet Take 1 tablet (400 mg total) by mouth 2 (two) times daily.  60 tablet  1  . metFORMIN (GLUCOPHAGE) 1000 MG tablet Take 1,000 mg by mouth 2 (two) times daily with a meal.      . Multiple Vitamins-Minerals (MULTIVITAMIN WITH MINERALS) tablet Take 1 tablet by mouth daily.      Marland Kitchen oxyCODONE (OXY IR/ROXICODONE) 5 MG immediate release tablet Take 1 tablet (5 mg total) by mouth every 4 (four) hours as needed for moderate pain.  30 tablet  0  . polyethylene glycol (MIRALAX / GLYCOLAX) packet Take 17 g by mouth daily.  14 each  0  . potassium chloride SA (K-DUR,KLOR-CON) 20 MEQ  tablet Take 20 mEq by mouth 2 (two) times daily.      . prochlorperazine (COMPAZINE) 5 MG tablet Take 1 tablet (5 mg total) by mouth every 8 (eight) hours as needed for nausea or vomiting.  30 tablet  0  . sertraline (ZOLOFT) 100 MG tablet Take 100 mg by mouth daily.      . sodium chloride 0.9 % SOLN 500 mL with vancomycin 10 G SOLR 1,500 mg Inject 1,500 mg into the vein daily.  10 Units  0  . vitamin C (ASCORBIC ACID) 500 MG tablet Take 500 mg by mouth daily.       No current facility-administered medications on file prior to visit.   Active Ambulatory Problems    Diagnosis Date Noted  . DIABETES MELLITUS, TYPE II 07/28/2006  . HYPERLIPIDEMIA NEC/NOS 07/28/2006  . ATHEROSCLEROSIS, CORONARY, NATIVE ARTERY 07/28/2006  . GERD 07/28/2006  . CHOLELITHIASIS, WITH OBSTRUCTION 07/28/2006  . CELLULITIS, ANKLE 06/28/2006  . BACTEREMIA 06/28/2006  . OTHER POSTOPERATIVE INFECTION 07/28/2006  . HX, PERSONAL,  MALIGNANCY, PROSTATE 07/28/2006  . HX, PERSONAL, MALIGNANCY, LYMPHATIC NEC 07/28/2006  . NEPHROLITHIASIS, HX OF 07/28/2006  . HX, PERSONAL, MUSCULOSKELETAL DISORD NEC 07/28/2006  . Cholelithiasis with cholecystitis 03/13/2013  . Cholecystitis with cholelithiasis 04/04/2013  . NHL (non-Hodgkin's lymphoma) 04/29/2013  . Anemia in neoplastic disease 05/13/2013  . Thrombocytopenia, unspecified 05/13/2013  . Lymphoma malignant, large cell 05/14/2013  . DM (diabetes mellitus) type 2, uncontrolled, with ketoacidosis 06/07/2013  . Hypokalemia 06/17/2013  . Facial pain 06/17/2013  . Cellulitis diffuse, face 06/18/2013  . Non Hodgkin's lymphoma 07/02/2013   Resolved Ambulatory Problems    Diagnosis Date Noted  . Lymphadenopathy, retroperitoneal 03/13/2013  . Lymphoma    Past Medical History  Diagnosis Date  . Hypertension   . Cancer of liver   . Kidney stone   . Prostate cancer   . Skin cancer   . Arthritis   . Anemia   . GERD (gastroesophageal reflux disease)   . [name]   . Sleep apnea   . Neuropathy in diabetes   . Diabetes mellitus        Review of Systems 10 point ROS except for rhinitis    Objective:   Physical Exam BP 90/52  Pulse 58  Temp(Src) 97.8 F (36.6 C) (Oral)  Wt 191 lb (86.637 kg) HEENT = no inflammation to nares, no rash      Assessment & Plan:  mrsa cellulitis = resolved. Finish 3 addn days of antibiotics. Continue with sunscreen to avoid sun sensitivity associated with doxy  rtc prn

## 2013-07-10 NOTE — Telephone Encounter (Signed)
Received call from daughter that patient was seen today by Infectious disease docs and his pressure was low and he is getting dehydrated. Note blood pressures of 90/52 and 90/48. Patient does complain of feeling weak and lightheaded. Discussed with Micah Flesher, PA, we can bring patient in today for a liter of fluid. Spoke with Abelina Bachelor, Charge RN in treatment room, patient can come today at 4pm. Will notify daughter [name] of date and time. Also encouraged daughter to call primary, Dr Shelia Media today to discuss his blood pressure meds. Since patient has been back on treatment, there may need to be some adjustments to his current hypertension regimen.  Called to inform [facility] of potential call and they have scheduled patient to come in today at 145 to see [place], [place]. Will forward our notes to update their records to fax [phone].

## 2013-07-15 ENCOUNTER — Telehealth: Payer: Self-pay | Admitting: *Deleted

## 2013-07-15 ENCOUNTER — Encounter: Payer: Self-pay | Admitting: Oncology

## 2013-07-17 DIAGNOSIS — I959 Hypotension, unspecified: Secondary | ICD-10-CM | POA: Diagnosis not present

## 2013-07-23 ENCOUNTER — Encounter: Payer: Self-pay | Admitting: Oncology

## 2013-07-25 DIAGNOSIS — I10 Essential (primary) hypertension: Secondary | ICD-10-CM | POA: Diagnosis not present

## 2013-07-25 DIAGNOSIS — E1129 Type 2 diabetes mellitus with other diabetic kidney complication: Secondary | ICD-10-CM | POA: Diagnosis not present

## 2013-07-25 DIAGNOSIS — Z125 Encounter for screening for malignant neoplasm of prostate: Secondary | ICD-10-CM | POA: Diagnosis not present

## 2013-07-25 DIAGNOSIS — E7800 Pure hypercholesterolemia, unspecified: Secondary | ICD-10-CM | POA: Diagnosis not present

## 2013-07-30 DIAGNOSIS — C61 Malignant neoplasm of prostate: Secondary | ICD-10-CM | POA: Diagnosis not present

## 2013-08-02 ENCOUNTER — Ambulatory Visit (HOSPITAL_BASED_OUTPATIENT_CLINIC_OR_DEPARTMENT_OTHER): Payer: Medicare Other

## 2013-08-02 VITALS — BP 125/55 | HR 62 | Temp 98.5°F

## 2013-08-02 DIAGNOSIS — C8589 Other specified types of non-Hodgkin lymphoma, extranodal and solid organ sites: Secondary | ICD-10-CM

## 2013-08-02 DIAGNOSIS — E119 Type 2 diabetes mellitus without complications: Secondary | ICD-10-CM | POA: Diagnosis not present

## 2013-08-02 DIAGNOSIS — Z0000 Encounter for general adult medical examination without abnormal findings: Secondary | ICD-10-CM | POA: Diagnosis not present

## 2013-08-02 DIAGNOSIS — I10 Essential (primary) hypertension: Secondary | ICD-10-CM | POA: Diagnosis not present

## 2013-08-02 DIAGNOSIS — E7800 Pure hypercholesterolemia, unspecified: Secondary | ICD-10-CM | POA: Diagnosis not present

## 2013-08-02 DIAGNOSIS — Z23 Encounter for immunization: Secondary | ICD-10-CM | POA: Diagnosis not present

## 2013-08-02 DIAGNOSIS — Z95828 Presence of other vascular implants and grafts: Secondary | ICD-10-CM

## 2013-08-02 DIAGNOSIS — Z452 Encounter for adjustment and management of vascular access device: Secondary | ICD-10-CM | POA: Diagnosis not present

## 2013-08-02 MED ORDER — SODIUM CHLORIDE 0.9 % IJ SOLN
10.0000 mL | INTRAMUSCULAR | Status: DC | PRN
  Administered 2013-08-02: 10 mL via INTRAVENOUS
  Filled 2013-08-02: qty 10

## 2013-08-02 MED ORDER — HEPARIN SOD (PORK) LOCK FLUSH 100 UNIT/ML IV SOLN
500.0000 [IU] | Freq: Once | INTRAVENOUS | Status: AC
  Administered 2013-08-02: 500 [IU] via INTRAVENOUS
  Filled 2013-08-02: qty 5

## 2013-08-14 ENCOUNTER — Telehealth: Payer: Self-pay | Admitting: *Deleted

## 2013-08-14 NOTE — Telephone Encounter (Signed)
I have called [name] and left her a message to call me back. Need to move appt from 7/3 to 7/2 due to the holiday

## 2013-08-16 ENCOUNTER — Emergency Department (HOSPITAL_COMMUNITY): Payer: Medicare Other

## 2013-08-16 ENCOUNTER — Inpatient Hospital Stay (HOSPITAL_COMMUNITY)
Admission: EM | Admit: 2013-08-16 | Discharge: 2013-08-19 | DRG: 100 | Disposition: A | Discharge status: Home / Self Care | Payer: Medicare Other | Attending: Internal Medicine | Admitting: Internal Medicine

## 2013-08-16 ENCOUNTER — Telehealth: Payer: Self-pay | Admitting: *Deleted

## 2013-08-16 DIAGNOSIS — C8580 Other specified types of non-Hodgkin lymphoma, unspecified site: Secondary | ICD-10-CM

## 2013-08-16 DIAGNOSIS — E1310 Other specified diabetes mellitus with ketoacidosis without coma: Secondary | ICD-10-CM | POA: Diagnosis present

## 2013-08-16 DIAGNOSIS — R519 Headache, unspecified: Secondary | ICD-10-CM

## 2013-08-16 DIAGNOSIS — E785 Hyperlipidemia, unspecified: Secondary | ICD-10-CM | POA: Diagnosis present

## 2013-08-16 DIAGNOSIS — Z8505 Personal history of malignant neoplasm of liver: Secondary | ICD-10-CM

## 2013-08-16 DIAGNOSIS — R209 Unspecified disturbances of skin sensation: Secondary | ICD-10-CM | POA: Diagnosis present

## 2013-08-16 DIAGNOSIS — C8589 Other specified types of non-Hodgkin lymphoma, extranodal and solid organ sites: Secondary | ICD-10-CM | POA: Diagnosis present

## 2013-08-16 DIAGNOSIS — E1149 Type 2 diabetes mellitus with other diabetic neurological complication: Secondary | ICD-10-CM | POA: Diagnosis not present

## 2013-08-16 DIAGNOSIS — G629 Polyneuropathy, unspecified: Secondary | ICD-10-CM | POA: Diagnosis present

## 2013-08-16 DIAGNOSIS — I4891 Unspecified atrial fibrillation: Secondary | ICD-10-CM | POA: Diagnosis present

## 2013-08-16 DIAGNOSIS — I10 Essential (primary) hypertension: Secondary | ICD-10-CM

## 2013-08-16 DIAGNOSIS — E1142 Type 2 diabetes mellitus with diabetic polyneuropathy: Secondary | ICD-10-CM | POA: Diagnosis present

## 2013-08-16 DIAGNOSIS — G589 Mononeuropathy, unspecified: Secondary | ICD-10-CM

## 2013-08-16 DIAGNOSIS — L03211 Cellulitis of face: Secondary | ICD-10-CM

## 2013-08-16 DIAGNOSIS — R197 Diarrhea, unspecified: Secondary | ICD-10-CM | POA: Diagnosis present

## 2013-08-16 DIAGNOSIS — L02419 Cutaneous abscess of limb, unspecified: Secondary | ICD-10-CM

## 2013-08-16 DIAGNOSIS — I4892 Unspecified atrial flutter: Secondary | ICD-10-CM | POA: Diagnosis not present

## 2013-08-16 DIAGNOSIS — I495 Sick sinus syndrome: Secondary | ICD-10-CM | POA: Diagnosis not present

## 2013-08-16 DIAGNOSIS — Z794 Long term (current) use of insulin: Secondary | ICD-10-CM

## 2013-08-16 DIAGNOSIS — K8010 Calculus of gallbladder with chronic cholecystitis without obstruction: Secondary | ICD-10-CM

## 2013-08-16 DIAGNOSIS — H547 Unspecified visual loss: Secondary | ICD-10-CM | POA: Diagnosis present

## 2013-08-16 DIAGNOSIS — Z8546 Personal history of malignant neoplasm of prostate: Secondary | ICD-10-CM | POA: Diagnosis not present

## 2013-08-16 DIAGNOSIS — Z79899 Other long term (current) drug therapy: Secondary | ICD-10-CM

## 2013-08-16 DIAGNOSIS — R42 Dizziness and giddiness: Secondary | ICD-10-CM | POA: Diagnosis not present

## 2013-08-16 DIAGNOSIS — D630 Anemia in neoplastic disease: Secondary | ICD-10-CM | POA: Diagnosis not present

## 2013-08-16 DIAGNOSIS — R7881 Bacteremia: Secondary | ICD-10-CM

## 2013-08-16 DIAGNOSIS — Z87442 Personal history of urinary calculi: Secondary | ICD-10-CM

## 2013-08-16 DIAGNOSIS — Z8739 Personal history of other diseases of the musculoskeletal system and connective tissue: Secondary | ICD-10-CM

## 2013-08-16 DIAGNOSIS — R569 Unspecified convulsions: Secondary | ICD-10-CM | POA: Diagnosis not present

## 2013-08-16 DIAGNOSIS — E119 Type 2 diabetes mellitus without complications: Secondary | ICD-10-CM

## 2013-08-16 DIAGNOSIS — D696 Thrombocytopenia, unspecified: Secondary | ICD-10-CM

## 2013-08-16 DIAGNOSIS — T8140XA Infection following a procedure, unspecified, initial encounter: Secondary | ICD-10-CM

## 2013-08-16 DIAGNOSIS — Z7982 Long term (current) use of aspirin: Secondary | ICD-10-CM | POA: Diagnosis not present

## 2013-08-16 DIAGNOSIS — Z87898 Personal history of other specified conditions: Secondary | ICD-10-CM

## 2013-08-16 DIAGNOSIS — I2510 Atherosclerotic heart disease of native coronary artery without angina pectoris: Secondary | ICD-10-CM | POA: Diagnosis present

## 2013-08-16 DIAGNOSIS — Z85828 Personal history of other malignant neoplasm of skin: Secondary | ICD-10-CM

## 2013-08-16 DIAGNOSIS — E876 Hypokalemia: Secondary | ICD-10-CM | POA: Diagnosis present

## 2013-08-16 DIAGNOSIS — Z66 Do not resuscitate: Secondary | ICD-10-CM | POA: Diagnosis present

## 2013-08-16 DIAGNOSIS — D638 Anemia in other chronic diseases classified elsewhere: Secondary | ICD-10-CM | POA: Diagnosis present

## 2013-08-16 DIAGNOSIS — E860 Dehydration: Secondary | ICD-10-CM | POA: Diagnosis not present

## 2013-08-16 DIAGNOSIS — C8590 Non-Hodgkin lymphoma, unspecified, unspecified site: Secondary | ICD-10-CM

## 2013-08-16 DIAGNOSIS — K219 Gastro-esophageal reflux disease without esophagitis: Secondary | ICD-10-CM

## 2013-08-16 DIAGNOSIS — K8021 Calculus of gallbladder without cholecystitis with obstruction: Secondary | ICD-10-CM

## 2013-08-16 DIAGNOSIS — L03119 Cellulitis of unspecified part of limb: Secondary | ICD-10-CM

## 2013-08-16 DIAGNOSIS — E1110 Type 2 diabetes mellitus with ketoacidosis without coma: Secondary | ICD-10-CM | POA: Diagnosis present

## 2013-08-16 DIAGNOSIS — R590 Localized enlarged lymph nodes: Secondary | ICD-10-CM

## 2013-08-16 DIAGNOSIS — E782 Mixed hyperlipidemia: Secondary | ICD-10-CM | POA: Diagnosis present

## 2013-08-16 DIAGNOSIS — R51 Headache: Secondary | ICD-10-CM

## 2013-08-16 LAB — COMPREHENSIVE METABOLIC PANEL
ALT: 21 U/L (ref 0–53)
AST: 24 U/L (ref 0–37)
Albumin: 3.5 g/dL (ref 3.5–5.2)
Alkaline Phosphatase: 54 U/L (ref 39–117)
BUN: 15 mg/dL (ref 6–23)
CALCIUM: 9 mg/dL (ref 8.4–10.5)
CO2: 23 mEq/L (ref 19–32)
Chloride: 100 mEq/L (ref 96–112)
Creatinine, Ser: 0.96 mg/dL (ref 0.50–1.35)
GFR calc Af Amer: 89 mL/min — ABNORMAL LOW (ref >90)
GFR calc non Af Amer: 77 mL/min — ABNORMAL LOW (ref >90)
Glucose, Bld: 132 mg/dL — ABNORMAL HIGH (ref 70–99)
Potassium: 3.1 mEq/L — ABNORMAL LOW (ref 3.7–5.3)
SODIUM: 138 meq/L (ref 137–147)
TOTAL PROTEIN: 5.9 g/dL — AB (ref 6.0–8.3)
Total Bilirubin: 0.3 mg/dL (ref 0.3–1.2)

## 2013-08-16 LAB — I-STAT TROPONIN, ED: Troponin i, poc: 0.01 ng/mL (ref 0.00–0.08)

## 2013-08-16 LAB — URINALYSIS, ROUTINE W REFLEX MICROSCOPIC
Bilirubin Urine: NEGATIVE
Glucose, UA: NEGATIVE mg/dL
Hgb urine dipstick: NEGATIVE
Ketones, ur: NEGATIVE mg/dL
Leukocytes, UA: NEGATIVE
Nitrite: NEGATIVE
Protein, ur: NEGATIVE mg/dL
Specific Gravity, Urine: 1.009 (ref 1.005–1.030)
Urobilinogen, UA: 0.2 mg/dL (ref 0.0–1.0)
pH: 6.5 (ref 5.0–8.0)

## 2013-08-16 LAB — CBC
HCT: 26.9 % — ABNORMAL LOW (ref 39.0–52.0)
HEMOGLOBIN: 9.3 g/dL — AB (ref 13.0–17.0)
MCH: 32.1 pg (ref 26.0–34.0)
MCHC: 34.6 g/dL (ref 30.0–36.0)
MCV: 92.8 fL (ref 78.0–100.0)
Platelets: 112 10*3/uL — ABNORMAL LOW (ref 150–400)
RBC: 2.9 MIL/uL — ABNORMAL LOW (ref 4.22–5.81)
RDW: 13.9 % (ref 11.5–15.5)
WBC: 5.2 10*3/uL (ref 4.0–10.5)

## 2013-08-16 LAB — SEDIMENTATION RATE: Sed Rate: 15 mm/hr (ref 0–16)

## 2013-08-16 LAB — LIPASE, BLOOD: Lipase: 30 U/L (ref 11–59)

## 2013-08-16 LAB — GLUCOSE, CAPILLARY
Glucose-Capillary: 108 mg/dL — ABNORMAL HIGH (ref 70–99)
Glucose-Capillary: 172 mg/dL — ABNORMAL HIGH (ref 70–99)

## 2013-08-16 MED ORDER — ONDANSETRON HCL 4 MG/2ML IJ SOLN: 4.0000 mg | Freq: Four times a day (QID) | INTRAMUSCULAR | Status: DC | PRN

## 2013-08-16 MED ORDER — AMLODIPINE BESYLATE 2.5 MG PO TABS
2.5000 mg | ORAL_TABLET | Freq: Every day | ORAL | Status: DC
  Administered 2013-08-17 – 2013-08-19 (×3): 2.5 mg via ORAL
  Filled 2013-08-16 (×3): qty 1

## 2013-08-16 MED ORDER — ALBUTEROL SULFATE (2.5 MG/3ML) 0.083% IN NEBU: 2.5000 mg | INHALATION_SOLUTION | RESPIRATORY_TRACT | Status: DC | PRN

## 2013-08-16 MED ORDER — IOHEXOL 350 MG/ML SOLN
50.0000 mL | Freq: Once | INTRAVENOUS | Status: AC | PRN
  Administered 2013-08-16: 50 mL via INTRAVENOUS

## 2013-08-16 MED ORDER — ONDANSETRON HCL 4 MG/2ML IJ SOLN
4.0000 mg | Freq: Once | INTRAMUSCULAR | Status: AC
  Administered 2013-08-16: 4 mg via INTRAVENOUS
  Filled 2013-08-16: qty 2

## 2013-08-16 MED ORDER — MAGNESIUM OXIDE 400 MG PO TABS
400.0000 mg | ORAL_TABLET | Freq: Two times a day (BID) | ORAL | Status: DC
  Administered 2013-08-16 – 2013-08-19 (×6): 400 mg via ORAL
  Filled 2013-08-16 (×8): qty 1

## 2013-08-16 MED ORDER — ACETAMINOPHEN 650 MG RE SUPP: 650.0000 mg | Freq: Four times a day (QID) | RECTAL | Status: DC | PRN

## 2013-08-16 MED ORDER — ONDANSETRON HCL 4 MG PO TABS: 4.0000 mg | ORAL_TABLET | Freq: Four times a day (QID) | ORAL | Status: DC | PRN

## 2013-08-16 MED ORDER — SERTRALINE HCL 100 MG PO TABS
100.0000 mg | ORAL_TABLET | Freq: Every day | ORAL | Status: DC
  Administered 2013-08-17 – 2013-08-19 (×3): 100 mg via ORAL
  Filled 2013-08-16 (×3): qty 1

## 2013-08-16 MED ORDER — SODIUM CHLORIDE 0.9 % IJ SOLN
3.0000 mL | Freq: Two times a day (BID) | INTRAMUSCULAR | Status: DC
  Administered 2013-08-16 – 2013-08-19 (×5): 3 mL via INTRAVENOUS

## 2013-08-16 MED ORDER — POTASSIUM CHLORIDE CRYS ER 20 MEQ PO TBCR
40.0000 meq | EXTENDED_RELEASE_TABLET | Freq: Once | ORAL | Status: AC
  Administered 2013-08-16: 40 meq via ORAL
  Filled 2013-08-16: qty 2

## 2013-08-16 MED ORDER — GABAPENTIN 300 MG PO CAPS
300.0000 mg | ORAL_CAPSULE | Freq: Three times a day (TID) | ORAL | Status: DC
  Administered 2013-08-16 – 2013-08-19 (×8): 300 mg via ORAL
  Filled 2013-08-16 (×10): qty 1

## 2013-08-16 MED ORDER — INSULIN NPH (HUMAN) (ISOPHANE) 100 UNIT/ML ~~LOC~~ SUSP
40.0000 [IU] | Freq: Every evening | SUBCUTANEOUS | Status: DC
  Administered 2013-08-16 – 2013-08-18 (×3): 40 [IU] via SUBCUTANEOUS
  Filled 2013-08-16: qty 10

## 2013-08-16 MED ORDER — LORATADINE 10 MG PO TABS
10.0000 mg | ORAL_TABLET | Freq: Every day | ORAL | Status: DC
  Administered 2013-08-17 – 2013-08-19 (×3): 10 mg via ORAL
  Filled 2013-08-16 (×3): qty 1

## 2013-08-16 MED ORDER — OXYCODONE HCL 5 MG PO TABS: 5.0000 mg | ORAL_TABLET | ORAL | Status: DC | PRN

## 2013-08-16 MED ORDER — ASPIRIN EC 81 MG PO TBEC
81.0000 mg | DELAYED_RELEASE_TABLET | Freq: Every day | ORAL | Status: DC
  Administered 2013-08-17 – 2013-08-19 (×3): 81 mg via ORAL
  Filled 2013-08-16 (×3): qty 1

## 2013-08-16 MED ORDER — FENOFIBRATE 160 MG PO TABS
160.0000 mg | ORAL_TABLET | Freq: Every day | ORAL | Status: DC
  Administered 2013-08-16 – 2013-08-18 (×3): 160 mg via ORAL
  Filled 2013-08-16 (×4): qty 1

## 2013-08-16 MED ORDER — GUAIFENESIN-DM 100-10 MG/5ML PO SYRP: 5.0000 mL | ORAL_SOLUTION | ORAL | Status: DC | PRN

## 2013-08-16 MED ORDER — HEPARIN SODIUM (PORCINE) 5000 UNIT/ML IJ SOLN
5000.0000 [IU] | Freq: Three times a day (TID) | INTRAMUSCULAR | Status: DC
  Administered 2013-08-16 – 2013-08-19 (×9): 5000 [IU] via SUBCUTANEOUS
  Filled 2013-08-16 (×11): qty 1

## 2013-08-16 MED ORDER — ACETAMINOPHEN 325 MG PO TABS
650.0000 mg | ORAL_TABLET | Freq: Four times a day (QID) | ORAL | Status: DC | PRN
  Administered 2013-08-18: 650 mg via ORAL
  Filled 2013-08-16: qty 2

## 2013-08-16 MED ORDER — ATORVASTATIN CALCIUM 20 MG PO TABS
20.0000 mg | ORAL_TABLET | Freq: Every day | ORAL | Status: DC
  Administered 2013-08-16 – 2013-08-18 (×3): 20 mg via ORAL
  Filled 2013-08-16 (×4): qty 1

## 2013-08-16 MED ORDER — VITAMIN D3 25 MCG (1000 UNIT) PO TABS
1000.0000 [IU] | ORAL_TABLET | Freq: Every day | ORAL | Status: DC
  Administered 2013-08-17 – 2013-08-19 (×3): 1000 [IU] via ORAL
  Filled 2013-08-16 (×3): qty 1

## 2013-08-16 MED ORDER — SODIUM CHLORIDE 0.9 % IV SOLN
INTRAVENOUS | Status: AC
  Administered 2013-08-16 – 2013-08-17 (×3): via INTRAVENOUS

## 2013-08-16 MED ORDER — ALLOPURINOL 300 MG PO TABS
300.0000 mg | ORAL_TABLET | Freq: Every day | ORAL | Status: DC
  Administered 2013-08-17 – 2013-08-19 (×3): 300 mg via ORAL
  Filled 2013-08-16 (×3): qty 1

## 2013-08-16 MED ORDER — ATENOLOL 100 MG PO TABS
100.0000 mg | ORAL_TABLET | Freq: Every day | ORAL | Status: DC
  Filled 2013-08-16: qty 1

## 2013-08-16 MED ORDER — INSULIN ASPART 100 UNIT/ML ~~LOC~~ SOLN
0.0000 [IU] | Freq: Three times a day (TID) | SUBCUTANEOUS | Status: DC
  Administered 2013-08-17 – 2013-08-18 (×2): 3 [IU] via SUBCUTANEOUS
  Administered 2013-08-18: 2 [IU] via SUBCUTANEOUS
  Administered 2013-08-18 – 2013-08-19 (×2): 3 [IU] via SUBCUTANEOUS
  Administered 2013-08-19: 2 [IU] via SUBCUTANEOUS

## 2013-08-16 MED ORDER — INSULIN NPH (HUMAN) (ISOPHANE) 100 UNIT/ML ~~LOC~~ SUSP
30.0000 [IU] | Freq: Every day | SUBCUTANEOUS | Status: DC
  Administered 2013-08-17 – 2013-08-19 (×3): 30 [IU] via SUBCUTANEOUS
  Filled 2013-08-16: qty 10

## 2013-08-16 MED ORDER — VITAMIN C 500 MG PO TABS
500.0000 mg | ORAL_TABLET | Freq: Every day | ORAL | Status: DC
  Administered 2013-08-17 – 2013-08-19 (×3): 500 mg via ORAL
  Filled 2013-08-16 (×3): qty 1

## 2013-08-16 MED ORDER — EZETIMIBE 10 MG PO TABS
10.0000 mg | ORAL_TABLET | Freq: Every day | ORAL | Status: DC
  Administered 2013-08-16 – 2013-08-18 (×3): 10 mg via ORAL
  Filled 2013-08-16 (×4): qty 1

## 2013-08-16 MED ORDER — SODIUM CHLORIDE 0.9 % IV BOLUS (SEPSIS)
1000.0000 mL | Freq: Once | INTRAVENOUS | Status: AC
  Administered 2013-08-16: 1000 mL via INTRAVENOUS

## 2013-08-16 NOTE — Consult Note (Signed)
NEURO HOSPITALIST CONSULT NOTE    Reason for Consult: abnormal sensory feeling in head  HPI:                                                                                                                                          [name] is an 80 y.o. male who recently finished a chemo regime for NHL back in March.  Over the last 4 days he has been noting frequent abnormal sensations of both pain and numbness that starts in the back of his head/neck region then traverses up the back of his head , over the top and into bilateral eyes. At times, he will lose vision or have blurred vision.  The episodes only last for seconds and then clear.  They are stereotypical and the same on each event. He denies any loss of consciousness, incontinence, post ictal period. He has no history of seizure or previous episodes similar to this. Currently he is not having any episodes but states he has had > 20 today.   He is on Neurontin 300 mg TID  Past Medical History  Diagnosis Date  . Hypertension   . Cancer of liver   . Kidney stone   . Prostate cancer   . Skin cancer   . Arthritis   . Anemia   . GERD (gastroesophageal reflux disease)   . Lymphoma   . [name]   . Sleep apnea   . Neuropathy in diabetes     Hx: of  . Diabetes mellitus     INSULIN DEPENDENT  . HX, PERSONAL, MALIGNANCY, PROSTATE 07/28/2006    Annotation: 2001, resected Qualifier: Diagnosis of  By: [name], [name]      Past Surgical History  Procedure Laterality Date  . Prostatectomy    . Kidney stone surgery    . Rotator cuff repair    . Ankle surgery    . Cardiac catheterization      Hx: of 1970's  . Colonoscopy      Hx: of  . Infusion port  04/04/2013    RIGHT SUBCLAVIAN  . Cholecystectomy  04/04/2013  . Cholecystectomy N/A 04/04/2013    Procedure: LAPAROSCOPIC CHOLECYSTECTOMY WITH INTRAOPERATIVE CHOLANGIOGRAM;  [name], MD;  Location: [place];  Service: General;   [name];  . Portacath placement N/A 04/04/2013    Procedure: INSERTION PORT-A-CATH;  [name], MD;  Location: [facility] OR;  Service: General;  [name];    Family History  Problem Relation Age of Onset  . Heart disease Father      Social History:  reports that he has quit smoking. His smoking use included Cigarettes, Pipe, and Cigars. He smoked 0.00 packs per day. He has never used smokeless tobacco.  He reports that he does not drink alcohol or use illicit drugs.  Allergies  Allergen Reactions  . Niacin Other (See Comments)    headaches    MEDICATIONS:                                                                                                                     No current facility-administered medications for this encounter.   Current Outpatient Prescriptions  Medication Sig Dispense Refill  . allopurinol (ZYLOPRIM) 300 MG tablet Take 1 tablet (300 mg total) by mouth daily.  30 tablet  3  . amLODipine (NORVASC) 5 MG tablet Take 2.5 mg by mouth daily.       Marland Kitchen aspirin 81 MG tablet Take 81 mg by mouth daily.      Marland Kitchen atenolol (TENORMIN) 100 MG tablet Take 100 mg by mouth at bedtime.      Marland Kitchen atorvastatin (LIPITOR) 20 MG tablet Take 20 mg by mouth at bedtime.      . cetirizine (ZYRTEC) 10 MG tablet Take 10 mg by mouth daily.      . cholecalciferol (VITAMIN D) 1000 UNITS tablet Take 1,000 Units by mouth daily.      . Cyanocobalamin (VITAMIN B 12 PO) Take 2,500 mg by mouth daily.       Marland Kitchen ezetimibe (ZETIA) 10 MG tablet Take 10 mg by mouth at bedtime.      . fenofibrate 160 MG tablet Take 160 mg by mouth at bedtime.      . gabapentin (NEURONTIN) 300 MG capsule Take 300 mg by mouth 3 (three) times daily.       . insulin NPH Human (HUMULIN N,NOVOLIN N) 100 UNIT/ML injection Inject 35-40 Units into the skin 2 (two) times daily before a meal. 35 units in the morning and 40 units at bedtime.      . magnesium oxide (MAG-OX) 400 MG tablet Take 400 mg by mouth 2 (two)  times daily.      . metFORMIN (GLUCOPHAGE) 1000 MG tablet Take 1,000 mg by mouth 2 (two) times daily with a meal.      . Multiple Vitamins-Minerals (MULTIVITAMIN WITH MINERALS) tablet Take 1 tablet by mouth daily.      Marland Kitchen oxyCODONE (OXY IR/ROXICODONE) 5 MG immediate release tablet Take 1 tablet (5 mg total) by mouth every 4 (four) hours as needed for moderate pain.  30 tablet  0  . prochlorperazine (COMPAZINE) 5 MG tablet Take 1 tablet (5 mg total) by mouth every 8 (eight) hours as needed for nausea or vomiting.  30 tablet  0  . sertraline (ZOLOFT) 100 MG tablet Take 100 mg by mouth daily.      . vitamin C (ASCORBIC ACID) 500 MG tablet Take 500 mg by mouth daily.          ROS:  History obtained from the patient  General ROS: negative for - chills, fatigue, fever, night sweats, weight gain or weight loss Psychological ROS: negative for - behavioral disorder, hallucinations, memory difficulties, mood swings or suicidal ideation Ophthalmic ROS: negative for - blurry vision, double vision, eye pain or loss of vision ENT ROS: negative for - epistaxis, nasal discharge, oral lesions, sore throat, tinnitus or vertigo Allergy and Immunology ROS: negative for - hives or itchy/watery eyes Hematological and Lymphatic ROS: negative for - bleeding problems, bruising or swollen lymph nodes Endocrine ROS: negative for - galactorrhea, hair pattern changes, polydipsia/polyuria or temperature intolerance Respiratory ROS: negative for - cough, hemoptysis, shortness of breath or wheezing Cardiovascular ROS: negative for - chest pain, dyspnea on exertion, edema or irregular heartbeat Gastrointestinal ROS: negative for - abdominal pain, diarrhea, hematemesis, nausea/vomiting or stool incontinence Genito-Urinary ROS: negative for - dysuria, hematuria, incontinence or urinary  frequency/urgency Musculoskeletal ROS: negative for - joint swelling or muscular weakness Neurological ROS: as noted in HPI Dermatological ROS: negative for rash and skin lesion changes   Blood pressure 145/69, pulse 63, temperature 98.9 F (37.2 C), temperature source Oral, resp. rate 10, SpO2 95.00%.   Neurologic Examination:                                                                                                      Mental Status: Alert, oriented, thought content appropriate.  Speech fluent without evidence of aphasia.  Able to follow 3 step commands without difficulty. Cranial Nerves: II: Discs flat bilaterally; Visual fields grossly normal, pupils equal, round, reactive to light and accommodation III,IV, VI: ptosis not present, extra-ocular motions intact bilaterally V,VII: smile symmetric, facial light touch sensation normal bilaterally VIII: hearing normal bilaterally IX,X: gag reflex present XI: bilateral shoulder shrug XII: midline tongue extension without atrophy or fasciculations  Motor: Right : Upper extremity   5/5    Left:     Upper extremity   5/5  Lower extremity   5/5     Lower extremity   5/5 Tone and bulk:normal tone throughout; no atrophy noted Sensory: Pinprick and light touch intact throughout, bilaterally Deep Tendon Reflexes:  Right: Upper Extremity   Left: Upper extremity   biceps (C-5 to C-6) 2/4   biceps (C-5 to C-6) 2/4 tricep (C7) 2/4    triceps (C7) 2/4 Brachioradialis (C6) 2/4  Brachioradialis (C6) 2/4  Lower Extremity Lower Extremity  quadriceps (L-2 to L-4) 1/4   quadriceps (L-2 to L-4) 1/4 Achilles (S1) 0/4   Achilles (S1) 0/4  Plantars: Right: downgoing   Left: downgoing Cerebellar: normal finger-to-nose,  normal heel-to-shin test Gait: not tested  CV: pulses palpable throughout    Lab Results: Basic Metabolic Panel:  Recent Labs Lab 08/16/13 1253  NA 138  K 3.1*  CL 100  CO2 23  GLUCOSE 132*  BUN 15  CREATININE 0.96   CALCIUM 9.0    Liver Function Tests:  Recent Labs Lab 08/16/13 1253  AST 24  ALT 21  ALKPHOS 54  BILITOT 0.3  PROT 5.9*  ALBUMIN 3.5  Recent Labs Lab 08/16/13 1253  LIPASE 30   No results found for this basename: AMMONIA,  in the last 168 hours  CBC:  Recent Labs Lab 08/16/13 1253  WBC 5.2  HGB 9.3*  HCT 26.9*  MCV 92.8  PLT 112*    Cardiac Enzymes: No results found for this basename: CKTOTAL, CKMB, CKMBINDEX, TROPONINI,  in the last 168 hours  Lipid Panel: No results found for this basename: CHOL, TRIG, HDL, CHOLHDL, VLDL, LDLCALC,  in the last 168 hours  CBG: No results found for this basename: GLUCAP,  in the last 168 hours  Microbiology: Results for orders placed in visit on 06/17/13  MRSA CULTURE     Status: None   Collection Time    06/17/13  2:50 PM      Result Value Ref Range Status   Staph Screen Nasal (Revised) MRSA Screen   Corrected   Comment: THIS IS A CORRECTED RESULT! Original result:  (Added) MRSA Screen------------------------------------------------------------------------NASALFinal - ===== FINAL REPORT =====Moderate METHICILLIN RESISTANT STAPHYLOCOCCUS AUREUS    Coagulation Studies: No results found for this basename: LABPROT, INR,  in the last 72 hours  Imaging: Ct Angio Head W/cm &/or Wo Cm  08/16/2013   CLINICAL DATA:  Blurred vision.  Dizziness.  Lymphoma.  EXAM: CT ANGIOGRAPHY HEAD  TECHNIQUE: Multidetector CT imaging of the head was performed using the standard protocol during bolus administration of intravenous contrast. Multiplanar CT image reconstructions and MIPs were obtained to evaluate the vascular anatomy.  CONTRAST:  35mL OMNIPAQUE IOHEXOL 350 MG/ML SOLN  COMPARISON:  Head CT 04/01/2009  FINDINGS: The brain shows generalized atrophy. There chronic small vessel changes throughout the hemispheric deep white matter. No sign of acute infarction, mass lesion, hemorrhage, hydrocephalus or extra-axial collection.  Both cervical  internal carotid arteries are ectatic with a diameter as large as 8 mm. There is atherosclerotic calcification beneath the skullbase bilaterally. On the right, there is probably a calcified pseudo aneurysm without a prominent lumen.  There is atherosclerotic calcification in the carotid siphon regions without stenosis. Calcification extends into the supra clinoid internal carotid arteries but there is not narrowing more than about 30%.  The anterior and middle cerebral vessels are patent bilaterally without proximal stenosis, aneurysm or vascular malformation.  Both vertebral arteries are patent through the foramen magnum. There is atherosclerotic calcification of the distal vertebral arteries bilaterally with narrowing of 30-50% bilaterally. The basilar artery is widely patent without stenosis or irregularity. Superior cerebellar and posterior cerebral arteries appear patent and normal.  Venous structures appear normal.  Review of the MIP images confirms the above findings.  IMPRESSION: No evidence of acute infarction. Generalized atrophy and chronic small vessel ischemic change.  Atherosclerotic calcification of the vertebral and internal carotid arteries at the base of the brain. There is no flow-limiting stenosis. Maximal narrowing in the carotid siphon and supra clinoid internal carotid artery regions and in the distal vertebral artery regions is no more than 30-50%.  Intracranial branch vessels are patent and unremarkable.  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   Electronically Signed   By: Nelson Chimes M.D.   On: 08/16/2013 15:10   Dg Chest Portable 1 View  08/16/2013   CLINICAL DATA:  Dehydration  EXAM: PORTABLE CHEST - 1 VIEW  COMPARISON:  06/18/2013  FINDINGS: The cardiac shadow is within normal limits. The lungs are clear bilaterally. A right-sided chest wall port is again seen and  stable.  IMPRESSION: No  active disease.   Electronically Signed   By: Inez Catalina M.D.   On: 08/16/2013 13:09       Assessment and plan per attending neurologist  Etta Quill PA-C [facility] [phone]  08/16/2013, 4:02 PM   Assessment/Plan:  80 YO male with 4 day history of intermittent abnormal sensations of tingling and pain that radiates from the posterior aspect of head to the anterior (bilaterally), +/- blurred or loss vision. CTA head was negative.  Given frequency and stereotypical pattern patient may be experiencing primary sensory seizures emanating from the parietal cortex and propagating to the occipital lobes . We have called EEG but they are unable to hook patient up today but will obtain prolonged EEG tomorrow.   Plan: 1) Prolonged EEG in hopes to capture at least 2 or his habitual events.      Patient seen and examined together with physician assistant and I concur with the assessment and plan.  Dorian Pod, MD

## 2013-08-16 NOTE — ED Notes (Addendum)
Cardiac monitor alarming for 3-4 second pauses; Pt asymptomatic with pauses; EKG Documentation in chart ; Dr.Ghimire aware

## 2013-08-16 NOTE — Telephone Encounter (Signed)
SINCE Tuesday PT. HAS HAD DIARRHEA, NAUSEA, HEADACHE, AND BLURRED VISION. HE ALSO HAS BEEN IRRITABLE. PT. HAD THESE SYMPTOMS WHEN HE WOULD BECOME DEHYDRATED AFTER CHEMO. PT.'S B/P IS 149/71. HE HAS DRANK 50 OUNCES OF WATER IN THE PAST TWO HOURS. PT. IS NOT ON CHEMOTHERAPY AT THIS TIME. DR.MAGRINAT'S NURSE, AMY MITCHELL,RN, SPOKE WITH PT.'S DAUGHTER,KAREN. FAMILY WILL FOLLOW UP WITH PT.'S PRIMARY CARE PHYSICIAN.

## 2013-08-16 NOTE — Progress Notes (Signed)
CMT reports pt having non sustained Aflutter with pauses lasting longer than 4 seconds with spontaneous conversion to SR; asymptomatic Michael Magic, NP aware of pauses in to assess. EKG obtained; NSR x 2. Pt family reports Pt history of "irregular heat beat"

## 2013-08-16 NOTE — ED Notes (Signed)
Last access port a cath April 10th 2015.

## 2013-08-16 NOTE — Progress Notes (Signed)
[facility] progress note. Chief complaint. Cardiac pauses. History of present illness. This 80 year old male with a past history of diabetes, hypertension, hyperlipidemia, coronary artery disease was admitted earlier today for complaints of change of sensation/heaviness in the head with visual changes lasting a few seconds multiple times daily. Patient is being worked up for this and has been monitored on telemetry. Patient has been noted to have at least 2 significant cardiac pauses in the 6 second range. The patient had been on atenolol 100 mg daily at bedtime and this has been discontinued. A 12-lead EKG was obtained at the bedside and this indicates normal sinus rhythm with a rate of 61. No changes seem suggestive of acute ischemia. Patient denies any symptomology surrounding these cardiac causes. Specifically denies chest pain or dyspnea. Vital signs. Temperature 97.3, pulse 62, respiration 18, blood pressure 146/68. O2 sats 94%. General appearance. Well-developed elderly male who is alert and in no distress. Cardiac. Heart sounds distant. Rate and rhythm regular with occasional irregular beat. No jugular venous distention or significant edema. Lungs. Breath sounds clear and equal. Abdomen. Soft with positive bowel sounds. No pain. Extremities. There is no significant edema. No calf pain and Homans negative. Impression/plan. Problem #1. Significant cardiac pauses. Discussed this presentation with the patient and his family at the bedside. There is apparently long-standing history of irregular heartbeat that the family can be no more specific at this time. I contacted Doctor Claiborne Billings on-call for cardiology. He agrees to see the patient in consult. He agrees with discontinuance of the beta blocker. We'll follow for any further cardiology recommendations.

## 2013-08-16 NOTE — H&P (Signed)
PATIENT DETAILS Name: [name] Age: 80 y.o. Sex: male Date of Birth: May 23, 1931 Admit Date: 08/16/2013 IFB:PPHKF,EXMDYJ DAVIDSON, MD  CHIEF COMPLAINT:  Diarrhea and abdominal sensory feeling in the head for the past 5 days  HPI: [name] is a 80 y.o. male with a Past Medical History of diabetes, hypertension, dyslipidemia, non-Hodgkin lymphoma  who presents today with the above noted complaint.Apparently for the past 2-4 days patient has had numerous episodes of loose watery stools, this has been associated without fever or abdominal pain. He also complains of a "heaviness"like sensation that starts in the back of his head/upper neck and moves the front of his head at times he loses vision or has blurred vision, this lasts only for a few seconds and then completely clear. Since he continued to have these episodes, he presented to the emergency room for further evaluation and treatment. He does not have any loss of consciousness, incontinence, not doesn't exhibit any post ictal state after these events. He does not have a history of seizures. Claims that he apparently gets around 15-20 episodes like this on a daily basis. A CT angiogram of the head done in the emergency room does not show any acute abnormalities. ED M.D., spoke with neurology was reconsulted on the patient, and current recommendations are for a prolonged EEG. Patient is now being admitted for further evaluation and treatment. There is no history of headache, fever, nausea, vomiting or diarrhea.  ALLERGIES:   Allergies  Allergen Reactions  . Niacin Other (See Comments)    headaches    PAST MEDICAL HISTORY: Past Medical History  Diagnosis Date  . Hypertension   . Cancer of liver   . Kidney stone   . Prostate cancer   . Skin cancer   . Arthritis   . Anemia   . GERD (gastroesophageal reflux disease)   . Lymphoma   . [name]   . Sleep apnea   . Neuropathy in diabetes     Hx: of  .  Diabetes mellitus     INSULIN DEPENDENT  . HX, PERSONAL, MALIGNANCY, PROSTATE 07/28/2006    Annotation: 2001, resected Qualifier: Diagnosis of  By: [name], [name]      PAST SURGICAL HISTORY: Past Surgical History  Procedure Laterality Date  . Prostatectomy    . Kidney stone surgery    . Rotator cuff repair    . Ankle surgery    . Cardiac catheterization      Hx: of 1970's  . Colonoscopy      Hx: of  . Infusion port  04/04/2013    RIGHT SUBCLAVIAN  . Cholecystectomy  04/04/2013  . Cholecystectomy N/A 04/04/2013    Procedure: LAPAROSCOPIC CHOLECYSTECTOMY WITH INTRAOPERATIVE CHOLANGIOGRAM;  [name], MD;  Location: [facility];  Service: General;  [name];  . Portacath placement N/A 04/04/2013    Procedure: INSERTION PORT-A-CATH;  [name], MD;  Location: [facility];  Service: General;  [name];    MEDICATIONS AT HOME: Prior to Admission medications   Medication Sig Start Date End Date Taking? Authorizing Provider  allopurinol (ZYLOPRIM) 300 MG tablet Take 1 tablet (300 mg total) by mouth daily. 05/03/13  Yes [name], MD  amLODipine (NORVASC) 5 MG tablet Take 2.5 mg by mouth daily.    Yes Historical Provider, MD  aspirin 81 MG tablet Take 81 mg by mouth daily.   Yes Historical Provider, MD  atenolol (TENORMIN)  100 MG tablet Take 100 mg by mouth at bedtime.   Yes Historical Provider, MD  atorvastatin (LIPITOR) 20 MG tablet Take 20 mg by mouth at bedtime.   Yes Historical Provider, MD  cetirizine (ZYRTEC) 10 MG tablet Take 10 mg by mouth daily.   Yes Historical Provider, MD  cholecalciferol (VITAMIN D) 1000 UNITS tablet Take 1,000 Units by mouth daily.   Yes Historical Provider, MD  Cyanocobalamin (VITAMIN B 12 PO) Take 2,500 mg by mouth daily.    Yes Historical Provider, MD  ezetimibe (ZETIA) 10 MG tablet Take 10 mg by mouth at bedtime.   Yes Historical Provider, MD  fenofibrate 160 MG tablet Take 160 mg by mouth at bedtime.   Yes  Historical Provider, MD  gabapentin (NEURONTIN) 300 MG capsule Take 300 mg by mouth 3 (three) times daily.    Yes Historical Provider, MD  insulin NPH Human (HUMULIN N,NOVOLIN N) 100 UNIT/ML injection Inject 35-40 Units into the skin 2 (two) times daily before a meal. 35 units in the morning and 40 units at bedtime.   Yes Historical Provider, MD  magnesium oxide (MAG-OX) 400 MG tablet Take 400 mg by mouth 2 (two) times daily.   Yes Historical Provider, MD  metFORMIN (GLUCOPHAGE) 1000 MG tablet Take 1,000 mg by mouth 2 (two) times daily with a meal.   Yes Historical Provider, MD  Multiple Vitamins-Minerals (MULTIVITAMIN WITH MINERALS) tablet Take 1 tablet by mouth daily.   Yes Historical Provider, MD  oxyCODONE (OXY IR/ROXICODONE) 5 MG immediate release tablet Take 1 tablet (5 mg total) by mouth every 4 (four) hours as needed for moderate pain. 06/20/13  Yes [name], MD  prochlorperazine (COMPAZINE) 5 MG tablet Take 1 tablet (5 mg total) by mouth every 8 (eight) hours as needed for nausea or vomiting. 06/07/13  Yes [name], MD  sertraline (ZOLOFT) 100 MG tablet Take 100 mg by mouth daily.   Yes Historical Provider, MD  vitamin C (ASCORBIC ACID) 500 MG tablet Take 500 mg by mouth daily.   Yes Historical Provider, MD    FAMILY HISTORY: Family History  Problem Relation Age of Onset  . Heart disease Father     SOCIAL HISTORY:  reports that he has quit smoking. His smoking use included Cigarettes, Pipe, and Cigars. He smoked 0.00 packs per day. He has never used smokeless tobacco. He reports that he does not drink alcohol or use illicit drugs.  REVIEW OF SYSTEMS:  Constitutional:   No  weight loss, night sweats,  Fevers, chills, fatigue.  HEENT:    No headaches, Difficulty swallowing,Tooth/dental problems,Sore throat,  No sneezing, itching, ear ache, nasal congestion, post nasal drip,   Cardio-vascular: No chest pain,  Orthopnea, PND, swelling in lower extremities, anasarca,  dizziness, palpitations  GI:  No heartburn, indigestion, abdominal pain, nausea, vomiting,change in bowel habits, loss of appetite  Resp: No shortness of breath with exertion or at rest.  No excess mucus, no productive cough, No non-productive cough,  No coughing up of blood.No change in color of mucus.No wheezing.No chest wall deformity  Skin:  no rash or lesions.  GU:  no dysuria, change in color of urine, no urgency or frequency.  No flank pain.  Musculoskeletal: No joint pain or swelling.  No decreased range of motion.  No back pain.  Psych: No change in mood or affect. No depression or anxiety.  No memory loss.   PHYSICAL EXAM: Blood pressure 145/69, pulse 63, temperature 98.9 F (37.2  C), temperature source Oral, resp. rate 10, SpO2 95.00%.  General appearance :Awake, alert, not in any distress. Speech Clear. Not toxic Looking HEENT: Atraumatic and Normocephalic, pupils equally reactive to light and accomodation Neck: supple, no JVD. No cervical lymphadenopathy.  Chest:Good air entry bilaterally, no added sounds  CVS: S1 S2 regular, no murmurs.  Abdomen: Bowel sounds present, Non tender and not distended with no gaurding, rigidity or rebound. Extremities: B/L Lower Ext shows no edema, both legs are warm to touch Neurology: Awake alert, and oriented X 3, CN II-XII intact, Non focal Skin:No Rash Wounds:N/A  LABS ON ADMISSION:   Recent Labs  08/16/13 1253  NA 138  K 3.1*  CL 100  CO2 23  GLUCOSE 132*  BUN 15  CREATININE 0.96  CALCIUM 9.0    Recent Labs  08/16/13 1253  AST 24  ALT 21  ALKPHOS 54  BILITOT 0.3  PROT 5.9*  ALBUMIN 3.5    Recent Labs  08/16/13 1253  LIPASE 30    Recent Labs  08/16/13 1253  WBC 5.2  HGB 9.3*  HCT 26.9*  MCV 92.8  PLT 112*   No results found for this basename: CKTOTAL, CKMB, CKMBINDEX, TROPONINI,  in the last 72 hours No results found for this basename: DDIMER,  in the last 72 hours No components found with  this basename: POCBNP,    RADIOLOGIC STUDIES ON ADMISSION: Ct Angio Head W/cm &/or Wo Cm  08/16/2013   CLINICAL DATA:  Blurred vision.  Dizziness.  Lymphoma.  EXAM: CT ANGIOGRAPHY HEAD  TECHNIQUE: Multidetector CT imaging of the head was performed using the standard protocol during bolus administration of intravenous contrast. Multiplanar CT image reconstructions and MIPs were obtained to evaluate the vascular anatomy.  CONTRAST:  66m OMNIPAQUE IOHEXOL 350 MG/ML SOLN  COMPARISON:  Head CT 04/01/2009  FINDINGS: The brain shows generalized atrophy. There chronic small vessel changes throughout the hemispheric deep white matter. No sign of acute infarction, mass lesion, hemorrhage, hydrocephalus or extra-axial collection.  Both cervical internal carotid arteries are ectatic with a diameter as large as 8 mm. There is atherosclerotic calcification beneath the skullbase bilaterally. On the right, there is probably a calcified pseudo aneurysm without a prominent lumen.  There is atherosclerotic calcification in the carotid siphon regions without stenosis. Calcification extends into the supra clinoid internal carotid arteries but there is not narrowing more than about 30%.  The anterior and middle cerebral vessels are patent bilaterally without proximal stenosis, aneurysm or vascular malformation.  Both vertebral arteries are patent through the foramen magnum. There is atherosclerotic calcification of the distal vertebral arteries bilaterally with narrowing of 30-50% bilaterally. The basilar artery is widely patent without stenosis or irregularity. Superior cerebellar and posterior cerebral arteries appear patent and normal.  Venous structures appear normal.  Review of the MIP images confirms the above findings.  IMPRESSION: No evidence of acute infarction. Generalized atrophy and chronic small vessel ischemic change.  Atherosclerotic calcification of the vertebral and internal carotid arteries at the base of the  brain. There is no flow-limiting stenosis. Maximal narrowing in the carotid siphon and supra clinoid internal carotid artery regions and in the distal vertebral artery regions is no more than 30-50%.  Intracranial branch vessels are patent and unremarkable.  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   Electronically Signed   By: MNelson ChimesM.D.   On: 08/16/2013 15:10   Dg Chest Portable 1  View  08/16/2013   CLINICAL DATA:  Dehydration  EXAM: PORTABLE CHEST - 1 VIEW  COMPARISON:  06/18/2013  FINDINGS: The cardiac shadow is within normal limits. The lungs are clear bilaterally. A right-sided chest wall port is again seen and stable.  IMPRESSION: No active disease.   Electronically Signed   By: Inez Catalina M.D.   On: 08/16/2013 13:09     EKG: Independently reviewed. Sinus bradycardia  ASSESSMENT AND PLAN: Present on Admission:  . Episodes of abnormal sensation in the post head area that radiate to the head anterioirly with loss/blurred vision-that lasts seconds: - Etiology is not certain, atypical sensory seizure in the differential per neurology. Will monitor in telemetry, follow clinical course. Neurology currently recommending prolonged EEG. Await further recommendations from neurology. - Check echocardiogram and carotid Doppler. Check ESR- however no headache. - Check orthostatics, however not related to any change in position.  . Diarrhea - Recently completed antibiotic therapy for facial cellulitis, check C. difficile PCR.   . Dehydration - Secondary to above, hydrate and reassess volume status tomorrow   . Hypokalemia - Replete and recheck in a.m. Likely secondary to diarrhea  . DM (diabetes mellitus) type 2, uncontrolled - Hold metformin, continue with home dosing of NPH, add sliding scale insulin   . Lymphoma malignant, large cell - Outpatient followup with oncology-sees Dr.  Jana Hakim  . ATHEROSCLEROSIS, CORONARY, NATIVE ARTERY - Continue with aspirin, statin beta blocker  - Or shortness of breath or chest pain.  Marland Kitchen HYPERLIPIDEMIA NEC/NOS - Continue with statins  Further plan will depend as patient's clinical course evolves and further radiologic and laboratory data become available. Patient will be monitored closely.  Above noted plan was discussed with patient/ daughter [name] at bedside, they were in agreement.   DVT Prophylaxis: Prophylactic Lovenox   Code Status: DNR- confirmed with patient and daughter [name]) at bedside   Total time spent for admission equals 45 minutes.  Jonetta Osgood [facility] Pager [phone]  If 7PM-7AM, please contact night-coverage [email or web address] Password [facility] 08/16/2013, 4:44 PM

## 2013-08-16 NOTE — ED Notes (Signed)
Pt reports funny feeling in head and neck and blurred vision, pt visitor then states that pt is having diarrhea

## 2013-08-16 NOTE — ED Provider Notes (Signed)
CSN: [id number]     Arrival date & time 08/16/13  1212 History   First MD Initiated Contact with Patient 08/16/13 1229     Chief Complaint  Patient presents with  . Dehydration     (Consider location/radiation/quality/duration/timing/severity/associated sxs/prior Treatment) HPI Comments: Patient is a 80 year old male with a past medical history of hypertension, prostate cancer, diabetes, kidney stones, skin cancer, arthritis, lymphoma, GERD and anemia who presents to the emergency department with his daughter from home complaining of feeling weak, tired, dehydrated and a "funny feeling" in his head and neck x 3 days. Patient states he has had intermittent, random episodes of a sensation of tingling from the right side of the back of his neck radiating up the top of his head to his for head with associated blurred vision. States the episodes last for a brief second and go away on their own. Denies headache, eye pain, numbness, confusion or speech changes. Denies focal weakness. Also states he has had multiple episodes of diarrhea x2 days. He has had 2 episodes today. States he feels nauseated but has not vomited. He was admitted to the hospital 2 months ago for MRSA infection and was on IV antibiotics. Denies fevers. No sick contacts.  The history is provided by the patient.    Past Medical History  Diagnosis Date  . Hypertension   . Cancer of liver   . Kidney stone   . Prostate cancer   . Skin cancer   . Arthritis   . Anemia   . GERD (gastroesophageal reflux disease)   . Lymphoma   . [name]   . Sleep apnea   . Neuropathy in diabetes     Hx: of  . Diabetes mellitus     INSULIN DEPENDENT  . HX, PERSONAL, MALIGNANCY, PROSTATE 07/28/2006    Annotation: 2001, resected Qualifier: Diagnosis of  By: [name], [name]     Past Surgical History  Procedure Laterality Date  . Prostatectomy    . Kidney stone surgery    . Rotator cuff repair    . Ankle surgery    . Cardiac  catheterization      Hx: of 1970's  . Colonoscopy      Hx: of  . Infusion port  04/04/2013    RIGHT SUBCLAVIAN  . Cholecystectomy  04/04/2013  . Cholecystectomy N/A 04/04/2013    Procedure: LAPAROSCOPIC CHOLECYSTECTOMY WITH INTRAOPERATIVE CHOLANGIOGRAM;  [name], MD;  Location: [facility];  Service: General;  [name];  . Portacath placement N/A 04/04/2013    Procedure: INSERTION PORT-A-CATH;  [name], MD;  Location: [facility] OR;  Service: General;  [name];   Family History  Problem Relation Age of Onset  . Heart disease Father    History  Substance Use Topics  . Smoking status: Former Smoker    Types: Cigarettes, Pipe, Landscape architect  . Smokeless tobacco: Never Used     Comment: QUIT SMOKING MANY YEARS AGO "  . Alcohol Use: No    Review of Systems  Eyes: Positive for visual disturbance.  Gastrointestinal: Positive for nausea and diarrhea.  Neurological: Positive for weakness.       Positive for tingling.  All other systems reviewed and are negative.     Allergies  Niacin  Home Medications   Prior to Admission medications   Medication Sig Start Date End Date Taking? Authorizing Provider  allopurinol (ZYLOPRIM) 300 MG tablet Take 1 tablet (300 mg total) by mouth daily. 05/03/13  Yes [name], MD  amLODipine (NORVASC) 5 MG tablet Take 2.5 mg by mouth daily.    Yes Historical Provider, MD  aspirin 81 MG tablet Take 81 mg by mouth daily.   Yes Historical Provider, MD  atenolol (TENORMIN) 100 MG tablet Take 100 mg by mouth at bedtime.   Yes Historical Provider, MD  atorvastatin (LIPITOR) 20 MG tablet Take 20 mg by mouth at bedtime.   Yes Historical Provider, MD  cetirizine (ZYRTEC) 10 MG tablet Take 10 mg by mouth daily.   Yes Historical Provider, MD  cholecalciferol (VITAMIN D) 1000 UNITS tablet Take 1,000 Units by mouth daily.   Yes Historical Provider, MD  Cyanocobalamin (VITAMIN B 12 PO) Take 2,500 mg by mouth daily.    Yes Historical  Provider, MD  ezetimibe (ZETIA) 10 MG tablet Take 10 mg by mouth at bedtime.   Yes Historical Provider, MD  fenofibrate 160 MG tablet Take 160 mg by mouth at bedtime.   Yes Historical Provider, MD  gabapentin (NEURONTIN) 300 MG capsule Take 300 mg by mouth 3 (three) times daily.    Yes Historical Provider, MD  insulin NPH Human (HUMULIN N,NOVOLIN N) 100 UNIT/ML injection Inject 35-40 Units into the skin 2 (two) times daily before a meal. 35 units in the morning and 40 units at bedtime.   Yes Historical Provider, MD  magnesium oxide (MAG-OX) 400 MG tablet Take 400 mg by mouth 2 (two) times daily.   Yes Historical Provider, MD  metFORMIN (GLUCOPHAGE) 1000 MG tablet Take 1,000 mg by mouth 2 (two) times daily with a meal.   Yes Historical Provider, MD  Multiple Vitamins-Minerals (MULTIVITAMIN WITH MINERALS) tablet Take 1 tablet by mouth daily.   Yes Historical Provider, MD  oxyCODONE (OXY IR/ROXICODONE) 5 MG immediate release tablet Take 1 tablet (5 mg total) by mouth every 4 (four) hours as needed for moderate pain. 06/20/13  Yes [name], MD  prochlorperazine (COMPAZINE) 5 MG tablet Take 1 tablet (5 mg total) by mouth every 8 (eight) hours as needed for nausea or vomiting. 06/07/13  Yes [name], MD  sertraline (ZOLOFT) 100 MG tablet Take 100 mg by mouth daily.   Yes Historical Provider, MD  vitamin C (ASCORBIC ACID) 500 MG tablet Take 500 mg by mouth daily.   Yes Historical Provider, MD   BP 150/68  Pulse 63  Temp(Src) 98.9 F (37.2 C) (Oral)  Resp 15  SpO2 98% Physical Exam  Nursing note and vitals reviewed. Constitutional: He is oriented to person, place, and time. He appears well-developed and well-nourished. No distress.  HENT:  Head: Normocephalic and atraumatic.  Mouth/Throat: Oropharynx is clear and moist.  No temporal artery tenderness.  Eyes: Conjunctivae and EOM are normal. Pupils are equal, round, and reactive to light.  Neck: Normal range of motion. Neck supple.  No spinous process tenderness and no muscular tenderness present.  Cardiovascular: Normal rate, regular rhythm, normal heart sounds and intact distal pulses.   No extremity edema.  Pulmonary/Chest: Effort normal and breath sounds normal. No respiratory distress.  Abdominal: Soft. Bowel sounds are normal. He exhibits no distension. There is no tenderness.  Musculoskeletal: Normal range of motion. He exhibits no edema.  Neurological: He is alert and oriented to person, place, and time. He has normal strength. No cranial nerve deficit or sensory deficit. Coordination normal.  Speech fluent, goal oriented. Moves limbs without ataxia. Equal grip strength bilateral.  Skin: Skin is warm and dry. He is not diaphoretic.  Psychiatric: He has a normal mood and affect. His behavior is normal.    ED Course  Procedures (including critical care time) Labs Review Labs Reviewed  CBC - Abnormal; Notable for the following:    RBC 2.90 (*)    Hemoglobin 9.3 (*)    HCT 26.9 (*)    Platelets 112 (*)    All other components within normal limits  COMPREHENSIVE METABOLIC PANEL - Abnormal; Notable for the following:    Potassium 3.1 (*)    Glucose, Bld 132 (*)    Total Protein 5.9 (*)    GFR calc non Af Amer 77 (*)    GFR calc Af Amer 89 (*)    All other components within normal limits  STOOL CULTURE  CLOSTRIDIUM DIFFICILE BY PCR  LIPASE, BLOOD  URINALYSIS, ROUTINE W REFLEX MICROSCOPIC  I-STAT TROPOININ, ED  I-STAT TROPOININ, ED    Imaging Review Ct Angio Head W/cm &/or Wo Cm  08/16/2013   CLINICAL DATA:  Blurred vision.  Dizziness.  Lymphoma.  EXAM: CT ANGIOGRAPHY HEAD  TECHNIQUE: Multidetector CT imaging of the head was performed using the standard protocol during bolus administration of intravenous contrast. Multiplanar CT image reconstructions and MIPs were obtained to evaluate the vascular anatomy.  CONTRAST:  78mL OMNIPAQUE IOHEXOL 350 MG/ML SOLN  COMPARISON:  Head CT 04/01/2009  FINDINGS: The  brain shows generalized atrophy. There chronic small vessel changes throughout the hemispheric deep white matter. No sign of acute infarction, mass lesion, hemorrhage, hydrocephalus or extra-axial collection.  Both cervical internal carotid arteries are ectatic with a diameter as large as 8 mm. There is atherosclerotic calcification beneath the skullbase bilaterally. On the right, there is probably a calcified pseudo aneurysm without a prominent lumen.  There is atherosclerotic calcification in the carotid siphon regions without stenosis. Calcification extends into the supra clinoid internal carotid arteries but there is not narrowing more than about 30%.  The anterior and middle cerebral vessels are patent bilaterally without proximal stenosis, aneurysm or vascular malformation.  Both vertebral arteries are patent through the foramen magnum. There is atherosclerotic calcification of the distal vertebral arteries bilaterally with narrowing of 30-50% bilaterally. The basilar artery is widely patent without stenosis or irregularity. Superior cerebellar and posterior cerebral arteries appear patent and normal.  Venous structures appear normal.  Review of the MIP images confirms the above findings.  IMPRESSION: No evidence of acute infarction. Generalized atrophy and chronic small vessel ischemic change.  Atherosclerotic calcification of the vertebral and internal carotid arteries at the base of the brain. There is no flow-limiting stenosis. Maximal narrowing in the carotid siphon and supra clinoid internal carotid artery regions and in the distal vertebral artery regions is no more than 30-50%.  Intracranial branch vessels are patent and unremarkable.  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   Electronically Signed   By: Nelson Chimes M.D.   On: 08/16/2013 15:10   Dg Chest Portable 1 View  08/16/2013   CLINICAL DATA:   Dehydration  EXAM: PORTABLE CHEST - 1 VIEW  COMPARISON:  06/18/2013  FINDINGS: The cardiac shadow is within normal limits. The lungs are clear bilaterally. A right-sided chest wall port is again seen and stable.  IMPRESSION: No active disease.   Electronically Signed   By: Inez Catalina M.D.   On: 08/16/2013 13:09     EKG Interpretation   Date/Time:  Friday August 16 2013 12:35:28 EDT Ventricular Rate:  60 PR Interval:  179 QRS Duration: 89 QT Interval:  434 QTC Calculation: 434 R Axis:   17 Text Interpretation:  Sinus rhythm Borderline T abnormalities, anterior  leads Sinus rhythm T wave abnormality No significant change since last  tracing Abnormal ekg Confirmed by Carmin Muskrat  MD [phone]) on 08/16/2013  1:02:51 PM      MDM   Final diagnoses:  Neuropathy  Diarrhea   Pt presenting with tingling sensation in his head with blurred vision. He had an episode of this while I was in the room and again when evaluated by Dr. [name], no change in heart monitor or appearance. He is in no pain. Hx of afib on ASA only. Plan to obtain CT angio head. Labs pending. Regarding diarrhea, abdomen soft and non-tender. Will obtain stool culture and culture for c diff given hospital admission within the past 90 days on abx.  Case discussed with attending Dr. [name] who also evaluated patient and agrees with plan of care.  3:29 PM CT angio without any acute findings. I spoke with neurology, Dr. Armida Sans who will evaluate patient. 4:03 PM Dr. Armida Sans suggests admission to hospitalist, further workup tomorrow. 4:10 PM Admission accepted by Dr. Sloan Leiter, [facility].  Illene Labrador, PA-C 08/16/13 1610

## 2013-08-16 NOTE — ED Notes (Signed)
[name], [name] at bedside

## 2013-08-17 ENCOUNTER — Inpatient Hospital Stay (HOSPITAL_COMMUNITY): Payer: Medicare Other

## 2013-08-17 DIAGNOSIS — I495 Sick sinus syndrome: Secondary | ICD-10-CM

## 2013-08-17 DIAGNOSIS — R7881 Bacteremia: Secondary | ICD-10-CM

## 2013-08-17 DIAGNOSIS — I4892 Unspecified atrial flutter: Secondary | ICD-10-CM

## 2013-08-17 DIAGNOSIS — D630 Anemia in neoplastic disease: Secondary | ICD-10-CM | POA: Diagnosis not present

## 2013-08-17 DIAGNOSIS — R42 Dizziness and giddiness: Secondary | ICD-10-CM

## 2013-08-17 DIAGNOSIS — G589 Mononeuropathy, unspecified: Secondary | ICD-10-CM | POA: Diagnosis not present

## 2013-08-17 LAB — GLUCOSE, CAPILLARY
GLUCOSE-CAPILLARY: 208 mg/dL — AB (ref 70–99)
GLUCOSE-CAPILLARY: 184 mg/dL — AB (ref 70–99)
Glucose-Capillary: 111 mg/dL — ABNORMAL HIGH (ref 70–99)
Glucose-Capillary: 144 mg/dL — ABNORMAL HIGH (ref 70–99)

## 2013-08-17 LAB — CBC
HCT: 28 % — ABNORMAL LOW (ref 39.0–52.0)
Hemoglobin: 9.6 g/dL — ABNORMAL LOW (ref 13.0–17.0)
MCH: 31.8 pg (ref 26.0–34.0)
MCHC: 34.3 g/dL (ref 30.0–36.0)
MCV: 92.7 fL (ref 78.0–100.0)
PLATELETS: 90 10*3/uL — AB (ref 150–400)
RBC: 3.02 MIL/uL — ABNORMAL LOW (ref 4.22–5.81)
RDW: 13.8 % (ref 11.5–15.5)
WBC: 5 10*3/uL (ref 4.0–10.5)

## 2013-08-17 LAB — MRSA PCR SCREENING: MRSA BY PCR: NEGATIVE

## 2013-08-17 LAB — BASIC METABOLIC PANEL
BUN: 14 mg/dL (ref 6–23)
CALCIUM: 9 mg/dL (ref 8.4–10.5)
CHLORIDE: 104 meq/L (ref 96–112)
CO2: 26 meq/L (ref 19–32)
CREATININE: 1.01 mg/dL (ref 0.50–1.35)
GFR calc non Af Amer: 69 mL/min — ABNORMAL LOW (ref >90)
GFR, EST AFRICAN AMERICAN: 79 mL/min — AB (ref >90)
Glucose, Bld: 129 mg/dL — ABNORMAL HIGH (ref 70–99)
Potassium: 3.1 mEq/L — ABNORMAL LOW (ref 3.7–5.3)
Sodium: 143 mEq/L (ref 137–147)

## 2013-08-17 LAB — HEMOGLOBIN A1C
HEMOGLOBIN A1C: 6.4 % — AB (ref <5.7)
MEAN PLASMA GLUCOSE: 137 mg/dL — AB (ref <117)

## 2013-08-17 MED ORDER — POTASSIUM CHLORIDE CRYS ER 20 MEQ PO TBCR
40.0000 meq | EXTENDED_RELEASE_TABLET | Freq: Two times a day (BID) | ORAL | Status: AC
  Administered 2013-08-17 (×2): 40 meq via ORAL
  Filled 2013-08-17 (×2): qty 2

## 2013-08-17 NOTE — ED Provider Notes (Signed)
  This was a shared visit with a mid-level provided (NP or PA).  Throughout the patient's course I was available for consultation/collaboration.  I saw the ECG (if appropriate), relevant labs and studies - I agree with the interpretation.  On my exam the patient was in no distress.  He had one episode of left-sided headache dysesthesia and transient visual change while I was evaluating him. During that he was in no distress, there were no abnormal events on his cardiac monitor. Patient's evaluation was largely reassuring, with ongoing intermittent visual changes, and fatigue, he was admitted for further evaluation and management after we discussed his case with neurology.     Carmin Muskrat, MD 08/17/13 1228

## 2013-08-17 NOTE — Progress Notes (Signed)
VASCULAR LAB PRELIMINARY  PRELIMINARY  PRELIMINARY  PRELIMINARY  Carotid Dopplers completed.    Preliminary report:  1-39% ICA stenosis.  Vertebral artery flow is antegrade.  Iantha Fallen, RVT 08/17/2013, 1:29 PM

## 2013-08-17 NOTE — Progress Notes (Signed)
Patient ID: [name], male   DOB: 1932-01-25, 80 y.o.   MRN: [id number] [facility] PROGRESS NOTE  [name] [id number] DOB: August 09, 1931 DOA: 08/16/2013 PCP: Horatio Pel, MD  Brief narrative: 80 y.o. male with diabetes, hypertension, dyslipidemia, non-Hodgkin lymphoma who presented with 2 days duration of loose BM's earlier prior to this admission, associated with heaviness that is radiating from the neck to the frontal head area, blurry vision, malaise. These episodes are intermittent and lasting several minutes at the time. CT angiogram of the head done in the emergency room did not show any acute abnormalities.   Principal Problem:   Diarrhea - C. Diff pending, stool panel also pending - now better per patient Active Problems:   Episodes of 5-6 second asymptomatic pauses - noted on the monitor and several of 2-4 seconds.  - pt remains asymptomatic, denies chest pain or shortness of breath this AM - stop atenolol for now as per cardiology recommendations  - continue to monitor on telemetry    ? Seizures  - plan for EEG today, appreciate neurology input    HYPERLIPIDEMIA - continue statin    ATHEROSCLEROSIS, CORONARY, NATIVE ARTERY - follow up on cardiology recommendations    Lymphoma malignant, large cell - outpatient follow up    DM (diabetes mellitus) type 2, uncontrolled, with ketoacidosis - continue Insulin as per home medical regimen - also continue Neurontin for neuropathy    Hypokalemia - still low, continue to supplement and repeat BMP In AM   Anemia of chronic disease - Hg and Hct stable and at baseline - no signs of active bleeding - rep;eat CBC in AM  Consultants:  Cardiology  Neurology  Procedures/Studies: Ct Angio Head W/cm &/or Wo Cm   08/16/2013  No evidence of acute infarction. Generalized atrophy and chronic small vessel ischemic change.  Atherosclerotic calcification of the vertebral and internal carotid arteries at the  base of the brain. There is no flow-limiting stenosis. Maximal narrowing in the carotid siphon and supra clinoid internal carotid artery regions and in the distal vertebral artery regions is no more than 30-50%.  Intracranial branch vessels are patent and unremarkable.   Antibiotics:  None  Code Status: DNR Family Communication: Pt and family at bedside Disposition Plan: Home when medically stable  HPI/Subjective: No events overnight.   Objective: Filed Vitals:   08/16/13 2135 08/17/13 0500 08/17/13 0511 08/17/13 0949  BP: 146/68  110/40 140/67  Pulse: 62  59 59  Temp: 97.3 F (36.3 C)  97.8 F (36.6 C) 97.9 F (36.6 C)  TempSrc: Oral  Oral Oral  Resp: 18  18 18   Height:      Weight:  87.091 kg (192 lb)    SpO2: 94%  100% 99%    Intake/Output Summary (Last 24 hours) at 08/17/13 1442 Last data filed at 08/17/13 0600  Gross per 24 hour  Intake 1212.5 ml  Output   1301 ml  Net  -88.5 ml    Exam:   General:  Pt is alert, follows commands appropriately, not in acute distress  Cardiovascular: Regular rate and rhythm, S1/S2, no murmurs, no rubs, no gallops  Respiratory: Clear to auscultation bilaterally, no wheezing, no crackles, no rhonchi  Abdomen: Soft, non tender, non distended, bowel sounds present, no guarding  Extremities: No edema, pulses DP and PT palpable bilaterally  Neuro: Grossly nonfocal  Data Reviewed: Basic Metabolic Panel:  Recent Labs Lab 08/16/13 1253 08/17/13 0533  NA 138 143  K 3.1* 3.1*  CL 100 104  CO2 23 26  GLUCOSE 132* 129*  BUN 15 14  CREATININE 0.96 1.01  CALCIUM 9.0 9.0   Liver Function Tests:  Recent Labs Lab 08/16/13 1253  AST 24  ALT 21  ALKPHOS 54  BILITOT 0.3  PROT 5.9*  ALBUMIN 3.5    Recent Labs Lab 08/16/13 1253  LIPASE 30   CBC:  Recent Labs Lab 08/16/13 1253 08/17/13 0533  WBC 5.2 5.0  HGB 9.3* 9.6*  HCT 26.9* 28.0*  MCV 92.8 92.7  PLT 112* 90*   CBG:  Recent Labs Lab 08/16/13 1822  08/16/13 2126 08/17/13 0637 08/17/13 1201  GLUCAP 108* 172* 111* 144*   Scheduled Meds: . allopurinol  300 mg Oral Daily  . amLODipine  2.5 mg Oral Daily  . aspirin EC  81 mg Oral Daily  . atorvastatin  20 mg Oral QHS  . cholecalciferol  1,000 Units Oral Daily  . ezetimibe  10 mg Oral QHS  . fenofibrate  160 mg Oral QHS  . gabapentin  300 mg Oral TID  . heparin  5,000 Units Subcutaneous 3 times per day  . insulin aspart  0-9 Units Subcutaneous TID WC  . insulin NPH Human  30 Units Subcutaneous QAC breakfast  . insulin NPH Human  40 Units Subcutaneous QPM  . loratadine  10 mg Oral Daily  . magnesium oxide  400 mg Oral BID  . sertraline  100 mg Oral Daily  . sodium chloride  3 mL Intravenous Q12H  . vitamin C  500 mg Oral Daily   Continuous Infusions: . sodium chloride 75 mL/hr at 08/17/13 1154     Carel Carrier Barbera Setters, MD  [facility] Pager [phone]  If 7PM-7AM, please contact night-coverage [email or web address] Password [email or web address] 08/17/2013, 2:42 PM   LOS: 1 day

## 2013-08-17 NOTE — Progress Notes (Signed)
Prolonged EEG started. 

## 2013-08-17 NOTE — Consult Note (Signed)
Reason for Consult: 5-6 second pauses Referring Physician: Aaryan [name] is an 80 y.o. male.  HPI: [name] is a 80 yo man with PMH of T2DM, hypertension, dyslipidemia and Non-hodgkin's lymphoma who was admitted 4/24 with diarrhea, vision loss and neurological symptoms that is being worked up by Neurology with EEG and CT scan. He has had 2 pauses between 5-6 seconds and several 2-4 second pauses all at which time he was asymptomatic. He is only atenolol 100 mg daily at bedtime. However, his ECGs have all been normal sinus rhythm with HR in the 60s. Cardiology consulted to discuss further. DNR noted in chart. I spoke with his daughters while he mainly slept. We discussed the plan to hold atenolol and observe before considering restart of metoprolol or another shorter acting medication.        Past Medical History  Diagnosis Date  . Hypertension   . Cancer of liver   . Kidney stone   . Prostate cancer   . Skin cancer   . Arthritis   . Anemia   . GERD (gastroesophageal reflux disease)   . Lymphoma   . [name]   . Sleep apnea   . Neuropathy in diabetes     Hx: of  . Diabetes mellitus     INSULIN DEPENDENT  . HX, PERSONAL, MALIGNANCY, PROSTATE 07/28/2006    Annotation: 2001, resected Qualifier: Diagnosis of  By: [name], [name]      Past Surgical History  Procedure Laterality Date  . Prostatectomy    . Kidney stone surgery    . Rotator cuff repair    . Ankle surgery    . Cardiac catheterization      Hx: of 1970's  . Colonoscopy      Hx: of  . Infusion port  04/04/2013    RIGHT SUBCLAVIAN  . Cholecystectomy  04/04/2013  . Cholecystectomy N/A 04/04/2013    Procedure: LAPAROSCOPIC CHOLECYSTECTOMY WITH INTRAOPERATIVE CHOLANGIOGRAM;  [name], MD;  Location: [facility];  Service: General;  [name];  . Portacath placement N/A 04/04/2013    Procedure: INSERTION PORT-A-CATH;  [name], MD;  Location: [facility]  OR;  Service: General;  [name];    Family History  Problem Relation Age of Onset  . Heart disease Father     Social History:  reports that he has quit smoking. His smoking use included Cigarettes, Pipe, and Cigars. He smoked 0.00 packs per day. He has never used smokeless tobacco. He reports that he does not drink alcohol or use illicit drugs.  Allergies:  Allergies  Allergen Reactions  . Niacin Other (See Comments)    headaches    Medications:  I have reviewed the patient's current medications. Prior to Admission:  Prescriptions prior to admission  Medication Sig Dispense Refill  . allopurinol (ZYLOPRIM) 300 MG tablet Take 1 tablet (300 mg total) by mouth daily.  30 tablet  3  . amLODipine (NORVASC) 5 MG tablet Take 2.5 mg by mouth daily.       Marland Kitchen aspirin 81 MG tablet Take 81 mg by mouth daily.      Marland Kitchen atenolol (TENORMIN) 100 MG tablet Take 100 mg by mouth at bedtime.      Marland Kitchen atorvastatin (LIPITOR) 20 MG tablet Take 20 mg by mouth at bedtime.      . cetirizine (ZYRTEC) 10 MG tablet Take 10 mg by mouth daily.      Marland Kitchen  cholecalciferol (VITAMIN D) 1000 UNITS tablet Take 1,000 Units by mouth daily.      . Cyanocobalamin (VITAMIN B 12 PO) Take 2,500 mg by mouth daily.       Marland Kitchen ezetimibe (ZETIA) 10 MG tablet Take 10 mg by mouth at bedtime.      . fenofibrate 160 MG tablet Take 160 mg by mouth at bedtime.      . gabapentin (NEURONTIN) 300 MG capsule Take 300 mg by mouth 3 (three) times daily.       . insulin NPH Human (HUMULIN N,NOVOLIN N) 100 UNIT/ML injection Inject 35-40 Units into the skin 2 (two) times daily before a meal. 35 units in the morning and 40 units at bedtime.      . magnesium oxide (MAG-OX) 400 MG tablet Take 400 mg by mouth 2 (two) times daily.      . metFORMIN (GLUCOPHAGE) 1000 MG tablet Take 1,000 mg by mouth 2 (two) times daily with a meal.      . Multiple Vitamins-Minerals (MULTIVITAMIN WITH MINERALS) tablet Take 1 tablet by mouth daily.      Marland Kitchen oxyCODONE (OXY  IR/ROXICODONE) 5 MG immediate release tablet Take 1 tablet (5 mg total) by mouth every 4 (four) hours as needed for moderate pain.  30 tablet  0  . prochlorperazine (COMPAZINE) 5 MG tablet Take 1 tablet (5 mg total) by mouth every 8 (eight) hours as needed for nausea or vomiting.  30 tablet  0  . sertraline (ZOLOFT) 100 MG tablet Take 100 mg by mouth daily.      . vitamin C (ASCORBIC ACID) 500 MG tablet Take 500 mg by mouth daily.       Scheduled: . allopurinol  300 mg Oral Daily  . amLODipine  2.5 mg Oral Daily  . aspirin EC  81 mg Oral Daily  . atorvastatin  20 mg Oral QHS  . cholecalciferol  1,000 Units Oral Daily  . ezetimibe  10 mg Oral QHS  . fenofibrate  160 mg Oral QHS  . gabapentin  300 mg Oral TID  . heparin  5,000 Units Subcutaneous 3 times per day  . insulin aspart  0-9 Units Subcutaneous TID WC  . insulin NPH Human  30 Units Subcutaneous QAC breakfast  . insulin NPH Human  40 Units Subcutaneous QPM  . loratadine  10 mg Oral Daily  . magnesium oxide  400 mg Oral BID  . sertraline  100 mg Oral Daily  . sodium chloride  3 mL Intravenous Q12H  . vitamin C  500 mg Oral Daily    Results for orders placed during the hospital encounter of 08/16/13 (from the past 48 hour(s))  CBC     Status: Abnormal   Collection Time    08/16/13 12:53 PM      Result Value Ref Range   WBC 5.2  4.0 - 10.5 K/uL   RBC 2.90 (*) 4.22 - 5.81 MIL/uL   Hemoglobin 9.3 (*) 13.0 - 17.0 g/dL   HCT 26.9 (*) 39.0 - 52.0 %   MCV 92.8  78.0 - 100.0 fL   MCH 32.1  26.0 - 34.0 pg   MCHC 34.6  30.0 - 36.0 g/dL   RDW 13.9  11.5 - 15.5 %   Platelets 112 (*) 150 - 400 K/uL   Comment: PLATELET COUNT CONFIRMED BY SMEAR  COMPREHENSIVE METABOLIC PANEL     Status: Abnormal   Collection Time    08/16/13 12:53 PM  Result Value Ref Range   Sodium 138  137 - 147 mEq/L   Potassium 3.1 (*) 3.7 - 5.3 mEq/L   Chloride 100  96 - 112 mEq/L   CO2 23  19 - 32 mEq/L   Glucose, Bld 132 (*) 70 - 99 mg/dL   BUN 15  6 -  23 mg/dL   Creatinine, Ser 0.96  0.50 - 1.35 mg/dL   Calcium 9.0  8.4 - 10.5 mg/dL   Total Protein 5.9 (*) 6.0 - 8.3 g/dL   Albumin 3.5  3.5 - 5.2 g/dL   AST 24  0 - 37 U/L   ALT 21  0 - 53 U/L   Alkaline Phosphatase 54  39 - 117 U/L   Total Bilirubin 0.3  0.3 - 1.2 mg/dL   GFR calc non Af Amer 77 (*) >90 mL/min   GFR calc Af Amer 89 (*) >90 mL/min   Comment: (NOTE)     The eGFR has been calculated using the CKD EPI equation.     This calculation has not been validated in all clinical situations.     eGFR's persistently <90 mL/min signify possible Chronic Kidney     Disease.  LIPASE, BLOOD     Status: None   Collection Time    08/16/13 12:53 PM      Result Value Ref Range   Lipase 30  11 - 59 U/L  URINALYSIS, ROUTINE W REFLEX MICROSCOPIC     Status: None   Collection Time    08/16/13  1:17 PM      Result Value Ref Range   Color, Urine YELLOW  YELLOW   APPearance CLEAR  CLEAR   Specific Gravity, Urine 1.009  1.005 - 1.030   pH 6.5  5.0 - 8.0   Glucose, UA NEGATIVE  NEGATIVE mg/dL   Hgb urine dipstick NEGATIVE  NEGATIVE   Bilirubin Urine NEGATIVE  NEGATIVE   Ketones, ur NEGATIVE  NEGATIVE mg/dL   Protein, ur NEGATIVE  NEGATIVE mg/dL   Urobilinogen, UA 0.2  0.0 - 1.0 mg/dL   Nitrite NEGATIVE  NEGATIVE   Leukocytes, UA NEGATIVE  NEGATIVE   Comment: MICROSCOPIC NOT DONE ON URINES WITH NEGATIVE PROTEIN, BLOOD, LEUKOCYTES, NITRITE, OR GLUCOSE <1000 mg/dL.  Randolm Idol, ED     Status: None   Collection Time    08/16/13  1:31 PM      Result Value Ref Range   Troponin i, poc 0.01  0.00 - 0.08 ng/mL   Comment 3            Comment: Due to the release kinetics of cTnI,     a negative result within the first hours     of the onset of symptoms does not rule out     myocardial infarction with certainty.     If myocardial infarction is still suspected,     repeat the test at appropriate intervals.  GLUCOSE, CAPILLARY     Status: Abnormal   Collection Time    08/16/13  6:22 PM        Result Value Ref Range   Glucose-Capillary 108 (*) 70 - 99 mg/dL  GLUCOSE, CAPILLARY     Status: Abnormal   Collection Time    08/16/13  9:26 PM      Result Value Ref Range   Glucose-Capillary 172 (*) 70 - 99 mg/dL   Comment 1 Documented in Chart     Comment 2 Notify RN    SEDIMENTATION RATE  Status: None   Collection Time    08/16/13  9:44 PM      Result Value Ref Range   Sed Rate 15  0 - 16 mm/hr    Ct Angio Head W/cm &/or Wo Cm  08/16/2013   CLINICAL DATA:  Blurred vision.  Dizziness.  Lymphoma.  EXAM: CT ANGIOGRAPHY HEAD  TECHNIQUE: Multidetector CT imaging of the head was performed using the standard protocol during bolus administration of intravenous contrast. Multiplanar CT image reconstructions and MIPs were obtained to evaluate the vascular anatomy.  CONTRAST:  71m OMNIPAQUE IOHEXOL 350 MG/ML SOLN  COMPARISON:  Head CT 04/01/2009  FINDINGS: The brain shows generalized atrophy. There chronic small vessel changes throughout the hemispheric deep white matter. No sign of acute infarction, mass lesion, hemorrhage, hydrocephalus or extra-axial collection.  Both cervical internal carotid arteries are ectatic with a diameter as large as 8 mm. There is atherosclerotic calcification beneath the skullbase bilaterally. On the right, there is probably a calcified pseudo aneurysm without a prominent lumen.  There is atherosclerotic calcification in the carotid siphon regions without stenosis. Calcification extends into the supra clinoid internal carotid arteries but there is not narrowing more than about 30%.  The anterior and middle cerebral vessels are patent bilaterally without proximal stenosis, aneurysm or vascular malformation.  Both vertebral arteries are patent through the foramen magnum. There is atherosclerotic calcification of the distal vertebral arteries bilaterally with narrowing of 30-50% bilaterally. The basilar artery is widely patent without stenosis or irregularity. Superior  cerebellar and posterior cerebral arteries appear patent and normal.  Venous structures appear normal.  Review of the MIP images confirms the above findings.  IMPRESSION: No evidence of acute infarction. Generalized atrophy and chronic small vessel ischemic change.  Atherosclerotic calcification of the vertebral and internal carotid arteries at the base of the brain. There is no flow-limiting stenosis. Maximal narrowing in the carotid siphon and supra clinoid internal carotid artery regions and in the distal vertebral artery regions is no more than 30-50%.  Intracranial branch vessels are patent and unremarkable.  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   Electronically Signed   By: MNelson ChimesM.D.   On: 08/16/2013 15:10   Dg Chest Portable 1 View  08/16/2013   CLINICAL DATA:  Dehydration  EXAM: PORTABLE CHEST - 1 VIEW  COMPARISON:  06/18/2013  FINDINGS: The cardiac shadow is within normal limits. The lungs are clear bilaterally. A right-sided chest wall port is again seen and stable.  IMPRESSION: No active disease.   Electronically Signed   By: MInez CatalinaM.D.   On: 08/16/2013 13:09    Review of Systems  Constitutional: Negative for fever and chills.  HENT: Negative for nosebleeds.   Eyes: Positive for blurred vision. Negative for pain and discharge.  Respiratory: Negative for cough and hemoptysis.   Cardiovascular: Negative for chest pain and palpitations.  Gastrointestinal: Negative for nausea and vomiting.  Genitourinary: Negative for dysuria and hematuria.  Musculoskeletal: Negative for neck pain.  Skin: Negative for rash.  Neurological: Positive for tingling and sensory change. Negative for headaches.  Endo/Heme/Allergies: Negative for polydipsia.  Psychiatric/Behavioral: Negative for suicidal ideas and substance abuse.   Blood pressure 146/68, pulse 62, temperature 97.3 F (36.3 C),  temperature source Oral, resp. rate 18, height 5' 8"  (1.727 m), weight 87.091 kg (192 lb), SpO2 94.00%. Physical Exam  Nursing note and vitals reviewed. Constitutional: He is oriented to person,  place, and time. He appears well-developed and well-nourished. No distress.  HENT:  Head: Normocephalic and atraumatic.  Nose: Nose normal.  Mouth/Throat: Oropharynx is clear and moist. No oropharyngeal exudate.  Eyes: Conjunctivae and EOM are normal. Pupils are equal, round, and reactive to light. No scleral icterus.  Neck: Normal range of motion. Neck supple. No JVD present. No tracheal deviation present.  Cardiovascular: Normal rate, regular rhythm, normal heart sounds and intact distal pulses.  Exam reveals no gallop.   No murmur heard. Respiratory: Effort normal and breath sounds normal. No respiratory distress. He has no wheezes. He has no rales.  GI: Soft. Bowel sounds are normal. He exhibits no distension. There is no tenderness.  Musculoskeletal: Normal range of motion. He exhibits no edema.  Neurological: He is alert and oriented to person, place, and time. No cranial nerve deficit.  Skin: Skin is warm and dry. No rash noted. He is not diaphoretic. No erythema.  Psychiatric: His behavior is normal. Thought content normal.   Labs reviewed; bun/cr 15/0.96, na 138, K 3.1, bun/cr 9.3/26.9, wbc 5.2 EKG NSR HR 61 Trop 0.00 12/14 Echo: EF 50-55%, grade I DD Strips reviewed: He's having conversion pauses from atrial fibrillation into NSR of 2-5.5 seconds  Problem List Neurological symptoms Sinus Pauses CAD T2DM, Hypertension, Dyslipidemia NH-Lymphoma  Assessment/Plan: 80 yo man with PMH of T2DM, hypertension, CAD, dyslipidemia who presents with neurological sequale and found to have two episodes of 5-6 second asymptomatic pauses on the monitor and several of 2-4 seconds. These pauses could be contributing to his sequale, however, he is asymptomatic on these two monitored/witnessed episodes  as well as multiple others. Furthermore, atenolol has a long half-life, particular in older patients with less renal clearance. At this juncture, I favor holding atenolol and assessing the telemetry and his symptoms. If the pauses resolve, then we are potentially done. If he needs to continue on a beta-blocker, then a lower dose of short-acting metoprolol or lower dose of metoprolol XL might be a good fit. Given his asymptomatic nature, I feel these pauses are less concerning, however 5+ seconds is long and warrants trial off atenolol. Otherwise, if beta-blocker is considered necessary, then pacemaker could be consider but it may e against Mr. Mader wishes.  - trial off atenolol - continue on telemetry  Jules Husbands 08/17/2013, 3:23 AM

## 2013-08-17 NOTE — Progress Notes (Signed)
Dr Armida Sans requested change to LTM EEG

## 2013-08-18 ENCOUNTER — Encounter (HOSPITAL_COMMUNITY): Payer: Self-pay | Admitting: *Deleted

## 2013-08-18 DIAGNOSIS — I495 Sick sinus syndrome: Secondary | ICD-10-CM | POA: Diagnosis not present

## 2013-08-18 DIAGNOSIS — I10 Essential (primary) hypertension: Secondary | ICD-10-CM

## 2013-08-18 DIAGNOSIS — I4892 Unspecified atrial flutter: Secondary | ICD-10-CM | POA: Diagnosis not present

## 2013-08-18 DIAGNOSIS — G589 Mononeuropathy, unspecified: Secondary | ICD-10-CM | POA: Diagnosis not present

## 2013-08-18 LAB — BASIC METABOLIC PANEL
BUN: 12 mg/dL (ref 6–23)
CHLORIDE: 104 meq/L (ref 96–112)
CO2: 24 meq/L (ref 19–32)
Calcium: 9.1 mg/dL (ref 8.4–10.5)
Creatinine, Ser: 0.91 mg/dL (ref 0.50–1.35)
GFR calc Af Amer: >90 mL/min (ref >90)
GFR calc non Af Amer: 78 mL/min — ABNORMAL LOW (ref >90)
Glucose, Bld: 172 mg/dL — ABNORMAL HIGH (ref 70–99)
POTASSIUM: 3.9 meq/L (ref 3.7–5.3)
SODIUM: 140 meq/L (ref 137–147)

## 2013-08-18 LAB — CBC
HCT: 30.5 % — ABNORMAL LOW (ref 39.0–52.0)
Hemoglobin: 10.5 g/dL — ABNORMAL LOW (ref 13.0–17.0)
MCH: 31.7 pg (ref 26.0–34.0)
MCHC: 34.4 g/dL (ref 30.0–36.0)
MCV: 92.1 fL (ref 78.0–100.0)
PLATELETS: 119 10*3/uL — AB (ref 150–400)
RBC: 3.31 MIL/uL — AB (ref 4.22–5.81)
RDW: 13.8 % (ref 11.5–15.5)
WBC: 6.1 10*3/uL (ref 4.0–10.5)

## 2013-08-18 LAB — GLUCOSE, CAPILLARY
GLUCOSE-CAPILLARY: 185 mg/dL — AB (ref 70–99)
Glucose-Capillary: 180 mg/dL — ABNORMAL HIGH (ref 70–99)
Glucose-Capillary: 205 mg/dL — ABNORMAL HIGH (ref 70–99)

## 2013-08-18 NOTE — Progress Notes (Signed)
NEURO HOSPITALIST PROGRESS NOTE   SUBJECTIVE:                                                                                                                        No complains. No spells so far. Interictal EEG normal.  OBJECTIVE:                                                                                                                           Vital signs in last 24 hours: Temp:  [97.9 F (36.6 C)-99.3 F (37.4 C)] 98.4 F (36.9 C) (04/26 0535) Pulse Rate:  [59-66] 61 (04/26 0535) Resp:  [18-20] 18 (04/26 0535) BP: (140-169)/(57-67) 169/66 mmHg (04/26 0535) SpO2:  [96 %-99 %] 97 % (04/26 0535) Weight:  [88.542 kg (195 lb 3.2 oz)] 88.542 kg (195 lb 3.2 oz) (04/26 0412)  Intake/Output from previous day: 04/25 0701 - 04/26 0700 In: 3 [I.V.:3] Out: 3525 [Urine:3525] Intake/Output this shift:   Nutritional status: Carb Control  Past Medical History  Diagnosis Date  . Hypertension   . Cancer of liver   . Kidney stone   . Prostate cancer   . Skin cancer   . Arthritis   . Anemia   . GERD (gastroesophageal reflux disease)   . Lymphoma   . [name]   . Sleep apnea   . Neuropathy in diabetes     Hx: of  . Diabetes mellitus     INSULIN DEPENDENT  . HX, PERSONAL, MALIGNANCY, PROSTATE 07/28/2006    Annotation: 2001, resected Qualifier: Diagnosis of  By: [name], [name]       Neurologic Exam:  Mental Status:  Alert, oriented, thought content appropriate. Speech fluent without evidence of aphasia. Able to follow 3 step commands without difficulty.  Cranial Nerves:  II: Discs flat bilaterally; Visual fields grossly normal, pupils equal, round, reactive to light and accommodation  III,IV, VI: ptosis not present, extra-ocular motions intact bilaterally  V,VII: smile symmetric, facial light touch sensation normal bilaterally  VIII: hearing normal bilaterally  IX,X: gag reflex present  XI: bilateral shoulder shrug   XII: midline tongue extension without atrophy or fasciculations  Motor:  Right : Upper extremity 5/5 Left: Upper extremity 5/5  Lower  extremity 5/5 Lower extremity 5/5  Tone and bulk:normal tone throughout; no atrophy noted  Sensory: Pinprick and light touch intact throughout, bilaterally  Deep Tendon Reflexes:  Right: Upper Extremity Left: Upper extremity  biceps (C-5 to C-6) 2/4 biceps (C-5 to C-6) 2/4  tricep (C7) 2/4 triceps (C7) 2/4  Brachioradialis (C6) 2/4 Brachioradialis (C6) 2/4  Lower Extremity Lower Extremity  quadriceps (L-2 to L-4) 1/4 quadriceps (L-2 to L-4) 1/4  Achilles (S1) 0/4 Achilles (S1) 0/4  Plantars:  Right: downgoing Left: downgoing  Cerebellar:  normal finger-to-nose, normal heel-to-shin test  Gait: not tested    Lab Results: No results found for this basename: cbc, bmp, coags, chol, tri, ldl, hga1c   Lipid Panel No results found for this basename: CHOL, TRIG, HDL, CHOLHDL, VLDL, LDLCALC,  in the last 72 hours  Studies/Results: Ct Angio Head W/cm &/or Wo Cm  08/16/2013   CLINICAL DATA:  Blurred vision.  Dizziness.  Lymphoma.  EXAM: CT ANGIOGRAPHY HEAD  TECHNIQUE: Multidetector CT imaging of the head was performed using the standard protocol during bolus administration of intravenous contrast. Multiplanar CT image reconstructions and MIPs were obtained to evaluate the vascular anatomy.  CONTRAST:  78mL OMNIPAQUE IOHEXOL 350 MG/ML SOLN  COMPARISON:  Head CT 04/01/2009  FINDINGS: The brain shows generalized atrophy. There chronic small vessel changes throughout the hemispheric deep white matter. No sign of acute infarction, mass lesion, hemorrhage, hydrocephalus or extra-axial collection.  Both cervical internal carotid arteries are ectatic with a diameter as large as 8 mm. There is atherosclerotic calcification beneath the skullbase bilaterally. On the right, there is probably a calcified pseudo aneurysm without a prominent lumen.  There is atherosclerotic  calcification in the carotid siphon regions without stenosis. Calcification extends into the supra clinoid internal carotid arteries but there is not narrowing more than about 30%.  The anterior and middle cerebral vessels are patent bilaterally without proximal stenosis, aneurysm or vascular malformation.  Both vertebral arteries are patent through the foramen magnum. There is atherosclerotic calcification of the distal vertebral arteries bilaterally with narrowing of 30-50% bilaterally. The basilar artery is widely patent without stenosis or irregularity. Superior cerebellar and posterior cerebral arteries appear patent and normal.  Venous structures appear normal.  Review of the MIP images confirms the above findings.  IMPRESSION: No evidence of acute infarction. Generalized atrophy and chronic small vessel ischemic change.  Atherosclerotic calcification of the vertebral and internal carotid arteries at the base of the brain. There is no flow-limiting stenosis. Maximal narrowing in the carotid siphon and supra clinoid internal carotid artery regions and in the distal vertebral artery regions is no more than 30-50%.  Intracranial branch vessels are patent and unremarkable.  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   Electronically Signed   By: Nelson Chimes M.D.   On: 08/16/2013 15:10   Dg Chest Portable 1 View  08/16/2013   CLINICAL DATA:  Dehydration  EXAM: PORTABLE CHEST - 1 VIEW  COMPARISON:  06/18/2013  FINDINGS: The cardiac shadow is within normal limits. The lungs are clear bilaterally. A right-sided chest wall port is again seen and stable.  IMPRESSION: No active disease.   Electronically Signed   By: Inez Catalina M.D.   On: 08/16/2013 13:09    MEDICATIONS  I have reviewed the patient's  current medications.  ASSESSMENT/PLAN:                                                                                                           Recent onset of rather unusual spells, ?? Partial sensory seizures emanating from parietal region with propagation to the occipital cortex. No spells so far. Will continue LTM. Will follow up.   Dorian Pod, MD [facility] [phone]  08/18/2013, 8:11 AM

## 2013-08-18 NOTE — Progress Notes (Signed)
SUBJECTIVE: The patient is doing well today.  He reports that his abrupt onset of episodes has significantly improved off of atenolol.  At this time, he denies chest pain, shortness of breath, or any new concerns.  Marland Kitchen allopurinol  300 mg Oral Daily  . amLODipine  2.5 mg Oral Daily  . aspirin EC  81 mg Oral Daily  . atorvastatin  20 mg Oral QHS  . cholecalciferol  1,000 Units Oral Daily  . ezetimibe  10 mg Oral QHS  . fenofibrate  160 mg Oral QHS  . gabapentin  300 mg Oral TID  . heparin  5,000 Units Subcutaneous 3 times per day  . insulin aspart  0-9 Units Subcutaneous TID WC  . insulin NPH Human  30 Units Subcutaneous QAC breakfast  . insulin NPH Human  40 Units Subcutaneous QPM  . loratadine  10 mg Oral Daily  . magnesium oxide  400 mg Oral BID  . sertraline  100 mg Oral Daily  . sodium chloride  3 mL Intravenous Q12H  . vitamin C  500 mg Oral Daily      OBJECTIVE: Physical Exam: Filed Vitals:   08/17/13 1805 08/17/13 2050 08/18/13 0412 08/18/13 0535  BP: 154/67 144/57  169/66  Pulse: 66 62  61  Temp: 99 F (37.2 C) 99.3 F (37.4 C)  98.4 F (36.9 C)  TempSrc: Oral Oral  Oral  Resp: 18 20  18   Height:      Weight:   195 lb 3.2 oz (88.542 kg)   SpO2: 96% 97%  97%    Intake/Output Summary (Last 24 hours) at 08/18/13 1206 Last data filed at 08/18/13 0553  Gross per 24 hour  Intake      3 ml  Output   3525 ml  Net  -3522 ml    Telemetry reveals sinus rhythm with several episodes of atrial flutter.  V rates are reasonably controlled during atrial flutter.  He does have post termination pauses.  He also has some sinus pauses.  GEN- The patient is elderly appearing, alert and oriented x 3 today.   Head- normocephalic, atraumatic Eyes-  Sclera clear, conjunctiva pink Ears- hearing intact Oropharynx- clear Neck- supple  Lungs- Clear to ausculation bilaterally, normal work of breathing Heart- Regular rate and rhythm  GI- soft, NT, ND, + BS Extremities- no  clubbing, cyanosis, or edema Neuro- strength and sensation are intact  LABS: Basic Metabolic Panel:  Recent Labs  08/17/13 0533 08/18/13 0507  NA 143 140  K 3.1* 3.9  CL 104 104  CO2 26 24  GLUCOSE 129* 172*  BUN 14 12  CREATININE 1.01 0.91  CALCIUM 9.0 9.1   Liver Function Tests:  Recent Labs  08/16/13 1253  AST 24  ALT 21  ALKPHOS 54  BILITOT 0.3  PROT 5.9*  ALBUMIN 3.5    Recent Labs  08/16/13 1253  LIPASE 30   CBC:  Recent Labs  08/17/13 0533 08/18/13 0507  WBC 5.0 6.1  HGB 9.6* 10.5*  HCT 28.0* 30.5*  MCV 92.7 92.1  PLT 90* 119*  Hemoglobin A1C:  Recent Labs  08/16/13 2144  HGBA1C 6.4*     ASSESSMENT AND PLAN:  Principal Problem:   Diarrhea Active Problems:   HYPERLIPIDEMIA NEC/NOS   ATHEROSCLEROSIS, CORONARY, NATIVE ARTERY   Lymphoma malignant, large cell   DM (diabetes mellitus) type 2, uncontrolled, with ketoacidosis   Hypokalemia   Neuropathy   Dehydration  1. Sick sinus syndrome The patient is  documented to have post conversion (from atria flutter) pauses as well as sinus pauses.  These have improved since stopping atenolol.  At this present time, we will continue to observe on telemetry. I am quite suspicious that these brief (several second) spells that he has been having are actually due to sinus pauses.  EP to follow  2. Atrial flutter Documented on telemetry Long term, we will need to determine if he is a candidate for anticoagulation.  Chads2vasc score is at least 4.   IF V rates become more elevated and atrial flutter more sustained then we will likely need to consider ablation.  I would defer for now.  Given moderate LA enlargement he is at risk for afib long term.  3. HTN Stable No change required today  EP to follow while atenolol washes out.  Thompson Grayer, MD 08/18/2013 12:06 PM

## 2013-08-18 NOTE — Progress Notes (Signed)
Patient ID: MARZELL LEDDON, male   DOB: 1931-04-29, 80 y.o.   MRN: [id number]  [facility] PROGRESS NOTE  KRIS TEASDALE [id number] DOB: 1931-06-20 DOA: 08/16/2013 PCP: Horatio Pel, MD  Brief narrative:  80 y.o. male with diabetes, hypertension, dyslipidemia, non-Hodgkin lymphoma who presented with 2 days duration of loose BM's earlier prior to this admission, associated with heaviness that is radiating from the neck to the frontal head area, blurry vision, malaise. These episodes are intermittent and lasting several minutes at the time. CT angiogram of the head done in the emergency room did not show any acute abnormalities.   Principal Problem:  Diarrhea  - C. Diff pending, stool panel also pending  - now better per patient  Active Problems:  Episodes of 5-6 second asymptomatic pauses  - noted on the monitor and several of 2-4 seconds.  - pt remains asymptomatic, denies chest pain or shortness of breath this AM  - stop atenolol for now as per cardiology recommendations  - continue to monitor on telemetry  ? Seizures  - management per neurology team, we appreciate input HYPERLIPIDEMIA  - continue statin  ATHEROSCLEROSIS, CORONARY, NATIVE ARTERY  - follow up on cardiology recommendations  Lymphoma malignant, large cell  - outpatient follow up  DM (diabetes mellitus) type 2, uncontrolled, with ketoacidosis  - continue Insulin as per home medical regimen  - also continue Neurontin for neuropathy  Hypokalemia  - supplemented and WNL this AM Anemia of chronic disease  - Hg and Hct stable and at baseline  - no signs of active bleeding  - repeat CBC in AM   Consultants:  Cardiology  Neurology  Procedures/Studies:  Ct Angio Head W/cm &/or Wo Cm 08/16/2013 No evidence of acute infarction. Generalized atrophy and chronic small vessel ischemic change. Atherosclerotic calcification of the vertebral and internal carotid arteries at the base of the brain. There is  no flow-limiting stenosis. Maximal narrowing in the carotid siphon and supra clinoid internal carotid artery regions and in the distal vertebral artery regions is no more than 30-50%. Intracranial branch vessels are patent and unremarkable.  Antibiotics:  None  Code Status: DNR  Family Communication: Pt and family at bedside  Disposition Plan: Home when medically stable  HPI/Subjective: No events overnight.   Objective: Filed Vitals:   08/17/13 1805 08/17/13 2050 08/18/13 0412 08/18/13 0535  BP: 154/67 144/57  169/66  Pulse: 66 62  61  Temp: 99 F (37.2 C) 99.3 F (37.4 C)  98.4 F (36.9 C)  TempSrc: Oral Oral  Oral  Resp: 18 20  18   Height:      Weight:   88.542 kg (195 lb 3.2 oz)   SpO2: 96% 97%  97%    Intake/Output Summary (Last 24 hours) at 08/18/13 1047 Last data filed at 08/18/13 0553  Gross per 24 hour  Intake      3 ml  Output   3525 ml  Net  -3522 ml    Exam:   General:  Pt is alert, follows commands appropriately, not in acute distress  Cardiovascular: Regular rate and rhythm, S1/S2, no murmurs, no rubs, no gallops  Respiratory: Clear to auscultation bilaterally, no wheezing, no crackles, no rhonchi  Abdomen: Soft, non tender, non distended, bowel sounds present, no guarding  Extremities: No edema, pulses DP and PT palpable bilaterally  Neuro: Grossly nonfocal, on EEG  Data Reviewed: Basic Metabolic Panel:  Recent Labs Lab 08/16/13 1253 08/17/13 0533 08/18/13 0507  NA 138 143 140  K 3.1* 3.1* 3.9  CL 100 104 104  CO2 23 26 24   GLUCOSE 132* 129* 172*  BUN 15 14 12   CREATININE 0.96 1.01 0.91  CALCIUM 9.0 9.0 9.1   Liver Function Tests:  Recent Labs Lab 08/16/13 1253  AST 24  ALT 21  ALKPHOS 54  BILITOT 0.3  PROT 5.9*  ALBUMIN 3.5    Recent Labs Lab 08/16/13 1253  LIPASE 30   CBC:  Recent Labs Lab 08/16/13 1253 08/17/13 0533 08/18/13 0507  WBC 5.2 5.0 6.1  HGB 9.3* 9.6* 10.5*  HCT 26.9* 28.0* 30.5*  MCV 92.8 92.7  92.1  PLT 112* 90* 119*   CBG:  Recent Labs Lab 08/17/13 0637 08/17/13 1201 08/17/13 1706 08/17/13 2059 08/18/13 0705  GLUCAP 111* 144* 208* 184* 180*    Recent Results (from the past 240 hour(s))  MRSA PCR SCREENING     Status: None   Collection Time    08/17/13  1:48 PM      Result Value Ref Range Status   MRSA by PCR NEGATIVE  NEGATIVE Final   Comment:            The GeneXpert MRSA Assay (FDA     approved for NASAL specimens     only), is one component of a     comprehensive MRSA colonization     surveillance program. It is not     intended to diagnose MRSA     infection nor to guide or     monitor treatment for     MRSA infections.     Scheduled Meds: . allopurinol  300 mg Oral Daily  . amLODipine  2.5 mg Oral Daily  . aspirin EC  81 mg Oral Daily  . atorvastatin  20 mg Oral QHS  . cholecalciferol  1,000 Units Oral Daily  . ezetimibe  10 mg Oral QHS  . fenofibrate  160 mg Oral QHS  . gabapentin  300 mg Oral TID  . heparin  5,000 Units Subcutaneous 3 times per day  . insulin aspart  0-9 Units Subcutaneous TID WC  . insulin NPH Human  30 Units Subcutaneous QAC breakfast  . insulin NPH Human  40 Units Subcutaneous QPM  . loratadine  10 mg Oral Daily  . magnesium oxide  400 mg Oral BID  . sertraline  100 mg Oral Daily  . sodium chloride  3 mL Intravenous Q12H  . vitamin C  500 mg Oral Daily   Continuous Infusions:  Theodis Blaze, MD  [facility] Pager [phone]  If 7PM-7AM, please contact night-coverage [email or web address] Password [facility] 08/18/2013, 10:47 AM   LOS: 2 days

## 2013-08-19 ENCOUNTER — Ambulatory Visit (HOSPITAL_COMMUNITY): Payer: Medicare Other

## 2013-08-19 ENCOUNTER — Other Ambulatory Visit: Payer: Self-pay | Admitting: *Deleted

## 2013-08-19 DIAGNOSIS — G589 Mononeuropathy, unspecified: Secondary | ICD-10-CM | POA: Diagnosis not present

## 2013-08-19 DIAGNOSIS — R55 Syncope and collapse: Secondary | ICD-10-CM

## 2013-08-19 DIAGNOSIS — D630 Anemia in neoplastic disease: Secondary | ICD-10-CM | POA: Diagnosis not present

## 2013-08-19 DIAGNOSIS — I2510 Atherosclerotic heart disease of native coronary artery without angina pectoris: Secondary | ICD-10-CM | POA: Diagnosis not present

## 2013-08-19 DIAGNOSIS — I495 Sick sinus syndrome: Secondary | ICD-10-CM | POA: Diagnosis not present

## 2013-08-19 DIAGNOSIS — I4892 Unspecified atrial flutter: Secondary | ICD-10-CM | POA: Diagnosis not present

## 2013-08-19 LAB — BASIC METABOLIC PANEL
BUN: 13 mg/dL (ref 6–23)
CHLORIDE: 100 meq/L (ref 96–112)
CO2: 24 mEq/L (ref 19–32)
Calcium: 9.6 mg/dL (ref 8.4–10.5)
Creatinine, Ser: 0.92 mg/dL (ref 0.50–1.35)
GFR calc non Af Amer: 78 mL/min — ABNORMAL LOW (ref >90)
GLUCOSE: 207 mg/dL — AB (ref 70–99)
Potassium: 3.7 mEq/L (ref 3.7–5.3)
Sodium: 137 mEq/L (ref 137–147)

## 2013-08-19 LAB — CBC
HEMATOCRIT: 33.8 % — AB (ref 39.0–52.0)
Hemoglobin: 11.8 g/dL — ABNORMAL LOW (ref 13.0–17.0)
MCH: 31.9 pg (ref 26.0–34.0)
MCHC: 34.9 g/dL (ref 30.0–36.0)
MCV: 91.4 fL (ref 78.0–100.0)
Platelets: 123 10*3/uL — ABNORMAL LOW (ref 150–400)
RBC: 3.7 MIL/uL — ABNORMAL LOW (ref 4.22–5.81)
RDW: 13.6 % (ref 11.5–15.5)
WBC: 7.7 10*3/uL (ref 4.0–10.5)

## 2013-08-19 LAB — GLUCOSE, CAPILLARY
Glucose-Capillary: 159 mg/dL — ABNORMAL HIGH (ref 70–99)
Glucose-Capillary: 154 mg/dL — ABNORMAL HIGH (ref 70–99)
Glucose-Capillary: 218 mg/dL — ABNORMAL HIGH (ref 70–99)

## 2013-08-19 MED ORDER — RIVAROXABAN 20 MG PO TABS: 20.0000 mg | ORAL_TABLET | Freq: Every day | ORAL | Status: DC

## 2013-08-19 MED ORDER — OXYCODONE HCL 5 MG PO TABS: 5.0000 mg | ORAL_TABLET | ORAL | Status: DC | PRN

## 2013-08-19 MED ORDER — SODIUM CHLORIDE 0.9 % IJ SOLN
10.0000 mL | INTRAMUSCULAR | Status: DC | PRN
  Administered 2013-08-19: 20 mL

## 2013-08-19 MED ORDER — HEPARIN SOD (PORK) LOCK FLUSH 100 UNIT/ML IV SOLN
500.0000 [IU] | INTRAVENOUS | Status: AC | PRN
  Administered 2013-08-19: 500 [IU]

## 2013-08-19 MED ORDER — ONDANSETRON HCL 4 MG PO TABS: 4.0000 mg | ORAL_TABLET | Freq: Four times a day (QID) | ORAL | Status: DC | PRN

## 2013-08-19 NOTE — Progress Notes (Signed)
NEURO HOSPITALIST PROGRESS NOTE   SUBJECTIVE:                                                                                                                        Stated that he is feeling pretty good today. No neurological complains. No spells since admission. LTM without evidence of epileptiform discharges or slowing.   OBJECTIVE:                                                                                                                           Vital signs in last 24 hours: Temp:  [98.3 F (36.8 C)-99.2 F (37.3 C)] 98.4 F (36.9 C) (04/27 [id number]) Pulse Rate:  [65-81] 81 (04/27 0614) Resp:  [18-20] 20 (04/27 0614) BP: (137-169)/(65-75) 137/75 mmHg (04/27 0614) SpO2:  [94 %-97 %] 96 % (04/27 0614) Weight:  [87.317 kg (192 lb 8 oz)] 87.317 kg (192 lb 8 oz) (04/27 0424)  Intake/Output from previous day: 04/26 0701 - 04/27 0700 In: 1500 [P.O.:1500] Out: 3225 [Urine:3225] Intake/Output this shift:   Nutritional status: Carb Control  Past Medical History  Diagnosis Date  . Hypertension   . Cancer of liver   . Kidney stone   . Prostate cancer   . Skin cancer   . Arthritis   . Anemia   . GERD (gastroesophageal reflux disease)   . Lymphoma   . [name]   . Sleep apnea   . Neuropathy in diabetes     Hx: of  . Diabetes mellitus     INSULIN DEPENDENT  . HX, PERSONAL, MALIGNANCY, PROSTATE 07/28/2006    Annotation: 2001, resected Qualifier: Diagnosis of  By: [name], [name]       Neurologic Exam:  Mental Status:  Alert, oriented, thought content appropriate. Speech fluent without evidence of aphasia. Able to follow 3 step commands without difficulty.  Cranial Nerves:  II: Discs flat bilaterally; Visual fields grossly normal, pupils equal, round, reactive to light and accommodation  III,IV, VI: ptosis not present, extra-ocular motions intact bilaterally  V,VII: smile symmetric, facial light touch sensation normal  bilaterally  VIII: hearing normal bilaterally  IX,X: gag reflex present  XI: bilateral shoulder shrug  XII: midline tongue extension without atrophy or  fasciculations  Motor:  Right : Upper extremity 5/5 Left: Upper extremity 5/5  Lower extremity 5/5 Lower extremity 5/5  Tone and bulk:normal tone throughout; no atrophy noted  Sensory: Pinprick and light touch intact throughout, bilaterally  Deep Tendon Reflexes:  Right: Upper Extremity Left: Upper extremity  biceps (C-5 to C-6) 2/4 biceps (C-5 to C-6) 2/4  tricep (C7) 2/4 triceps (C7) 2/4  Brachioradialis (C6) 2/4 Brachioradialis (C6) 2/4  Lower Extremity Lower Extremity  quadriceps (L-2 to L-4) 1/4 quadriceps (L-2 to L-4) 1/4  Achilles (S1) 0/4 Achilles (S1) 0/4  Plantars:  Right: downgoing Left: downgoing  Cerebellar:  normal finger-to-nose, normal heel-to-shin test  Gait: not tested    Lab Results: No results found for this basename: cbc, bmp, coags, chol, tri, ldl, hga1c   Lipid Panel No results found for this basename: CHOL, TRIG, HDL, CHOLHDL, VLDL, LDLCALC,  in the last 72 hours  Studies/Results: No results found.  MEDICATIONS                                                                                                                       I have reviewed the patient's current medications.  ASSESSMENT/PLAN:                                                                                                           80 y/o with recent onset of spells of unclear etiology. Hasn't had his habitual spells while in the hospital and LTM is unremarkable. Will discontinue LTM. Patient will need further neurological testing as outpatient if the spells recur. Neurology will sign off.  Dorian Pod, MD [facility] [phone]  08/19/2013, 7:30 AM

## 2013-08-19 NOTE — Progress Notes (Signed)
SUBJECTIVE: The patient is doing well today.  He has had no further pre-syncope or dizziness since day of admission.  He denies chest pain, shortness of breath, or any new concerns.  With occasional nocturnal post termination and sinus pauses, longest 3 seconds - none during waking hours.   CURRENT MEDICATIONS: . allopurinol  300 mg Oral Daily  . amLODipine  2.5 mg Oral Daily  . aspirin EC  81 mg Oral Daily  . atorvastatin  20 mg Oral QHS  . cholecalciferol  1,000 Units Oral Daily  . ezetimibe  10 mg Oral QHS  . fenofibrate  160 mg Oral QHS  . gabapentin  300 mg Oral TID  . heparin  5,000 Units Subcutaneous 3 times per day  . insulin aspart  0-9 Units Subcutaneous TID WC  . insulin NPH Human  30 Units Subcutaneous QAC breakfast  . insulin NPH Human  40 Units Subcutaneous QPM  . loratadine  10 mg Oral Daily  . magnesium oxide  400 mg Oral BID  . sertraline  100 mg Oral Daily  . sodium chloride  3 mL Intravenous Q12H  . vitamin C  500 mg Oral Daily      OBJECTIVE: Physical Exam: Filed Vitals:   08/18/13 1541 08/18/13 2130 08/19/13 0424 08/19/13 0614  BP: 169/65 160/69  137/75  Pulse: 65 70  81  Temp: 98.3 F (36.8 C) 99.2 F (37.3 C)  98.4 F (36.9 C)  TempSrc: Oral Oral  Oral  Resp: 18 20  20   Height:      Weight:   192 lb 8 oz (87.317 kg)   SpO2: 94% 97%  96%    Intake/Output Summary (Last 24 hours) at 08/19/13 0714 Last data filed at 08/19/13 [id number]  Gross per 24 hour  Intake   1500 ml  Output   3225 ml  Net  -1725 ml    GEN- The patient is elderly appearing, alert and oriented x 3 today.   Head- normocephalic, atraumatic Eyes-  Sclera clear, conjunctiva pink Ears- hearing intact Oropharynx- clear Neck- supple  Lungs- Clear to ausculation bilaterally, normal work of breathing Heart- Regular rate and rhythm  GI- soft, NT, ND, + BS Extremities- no clubbing, cyanosis, or edema Neuro- strength and sensation are intact  LABS: Basic Metabolic  Panel:  Recent Labs  08/17/13 0533 08/18/13 0507  NA 143 140  K 3.1* 3.9  CL 104 104  CO2 26 24  GLUCOSE 129* 172*  BUN 14 12  CREATININE 1.01 0.91  CALCIUM 9.0 9.1   Liver Function Tests:  Recent Labs  08/16/13 1253  AST 24  ALT 21  ALKPHOS 54  BILITOT 0.3  PROT 5.9*  ALBUMIN 3.5    Recent Labs  08/16/13 1253  LIPASE 30   CBC:  Recent Labs  08/17/13 0533 08/18/13 0507  WBC 5.0 6.1  HGB 9.6* 10.5*  HCT 28.0* 30.5*  MCV 92.7 92.1  PLT 90* 119*  Hemoglobin A1C:  Recent Labs  08/16/13 2144  HGBA1C 6.4*     ASSESSMENT AND PLAN:  Principal Problem:   Diarrhea Active Problems:   HYPERLIPIDEMIA NEC/NOS   ATHEROSCLEROSIS, CORONARY, NATIVE ARTERY   Lymphoma malignant, large cell   DM (diabetes mellitus) type 2, uncontrolled, with ketoacidosis   Hypokalemia   Neuropathy   Dehydration  1. Sick sinus syndrome Improved off of atenolol.  Do not restart chronotropic agents. 30 day event monitor No driving until he is seen back in the cardiology clinic  in 6 weeks  2. Atrial flutter/ atrial fibrillation Documented on telemetry Long term, we will need to determine if he is a candidate for anticoagulation.  Chads2vasc score is at least 4.   I would advise initiation of anticoagulation with xarelto.  CrCl is 81.1.  I would therefore advise xarelto 20mg  daily and stopping ASA at discharge.  3. HTN Stable No change required today  Electrophysiology team to see as needed while here. Please call with questions. Follow-up with Truitt Merle in 6 weeks to discuss results of event monitor If he has any syncope then he should return to [facility] for further assessment.

## 2013-08-19 NOTE — Discharge Instructions (Signed)
Holter Monitoring A Holter monitor is a small device with electrodes (small sticky patches) that attach to your chest. It records the electrical activity of your heart and is worn continuously for 24-48 hours.  A HOLTER MONITOR IS USED TO  Detect heart problems such as:  Heart arrhythmia. Is an abnormal or irregular heartbeat. With some heart arrhythmias, you may not feel or know that you have an irregular heart rhythm.  Palpitations, such as feeling your heart racing or fluttering. It is possible to have heart palpitations and not have a heart arrhythmia.  A heart rhythm that is too slow or too fast.  If you have problems fainting, near fainting or feeling light-headed, a Holter monitor may be worn to see if your heart is the cause. HOLTER MONITOR PREPARATION   Electrodes will be attached to the skin on your chest.  If you have hair on your chest, small areas may have to be shaved. This is done to help the patches stick better and make the recording more accurate.  The electrodes are attached by wires to the Holter monitor. The Holter monitor clips to your clothing. You will wear the monitor at all times, even while exercising and sleeping. HOME CARE INSTRUCTIONS   Wear your monitor at all times.  The wires and the monitor must stay dry. Do not get the monitor wet.  Do not bathe, swim or use a hot tub with it on.  You may do a "sponge" bath while you have the monitor on.  Keep your skin clean, do not put body lotion or moisturizer on your chest.  It's possible that your skin under the electrodes could become irritated. To keep this from happening, you may put the electrodes in slightly different places on your chest.  Your caregiver will also ask you to keep a diary of your activities, such as walking or doing chores. Be sure to note what you are doing if you experience heart symptoms such as palpitations. This will help your caregiver determine what might be contributing to your  symptoms. The information stored in your monitor will be reviewed by your caregiver alongside your diary entries.  Make sure the monitor is safely clipped to your clothing or in a location close to your body that your caregiver recommends.  The monitor and electrodes are removed when the test is over. Return the monitor as directed.  Be sure to follow up with your caregiver and discuss your Holter monitor results. SEEK IMMEDIATE MEDICAL CARE IF:  You faint or feel lightheaded.  You have trouble breathing.  You get pain in your chest, upper arm or jaw.  You feel sick to your stomach and your skin is pale, cool, or damp.  You think something is wrong with the way your heart is beating. MAKE SURE YOU:   Understand these instructions.  Will watch your condition.  Will get help right away if you are not doing well or get worse. Document Released: 01/08/2004 Document Revised: 07/04/2011 Document Reviewed: 05/22/2008 [facility] Patient Information 2014 [name], [place].

## 2013-08-19 NOTE — Progress Notes (Signed)
Porta cath deaccessed. Flushed with 10cc NS followed by Heparin 5ml (100u/ml). No bleeding to site, band aid to site for comfort. Ava Deguire M 

## 2013-08-19 NOTE — Progress Notes (Signed)
LTM discontinued.  

## 2013-08-19 NOTE — Discharge Summary (Signed)
Physician Discharge Summary  [name] [id number] DOB: 06-02-1931 DOA: 08/16/2013  PCP: Horatio Pel, MD  Admit date: 08/16/2013 Discharge date: 08/19/2013  Recommendations for Outpatient Follow-up:  1. Pt will need to follow up with PCP in 2-3 weeks post discharge 2. Please obtain BMP to evaluate electrolytes and kidney function 3. Please also check CBC to evaluate Hg and Hct levels  4. Stop atenolol 5. Stop aspirin and start Xarelto 6. Will need 30 day event monitor  7. No driving until he is seen back in the cardiology clinic in 6 weeks  8. Follow-up with Truitt Merle in 6 weeks to discuss results of event monitor  9. If he has any syncope then he should return to [facility] for further assessment.  Discharge Diagnoses:  Principal Problem:   Diarrhea Active Problems:   HYPERLIPIDEMIA NEC/NOS   ATHEROSCLEROSIS, CORONARY, NATIVE ARTERY   Lymphoma malignant, large cell   DM (diabetes mellitus) type 2, uncontrolled, with ketoacidosis   Hypokalemia   Neuropathy   Dehydration  Discharge Condition: Stable  Diet recommendation: Heart healthy diet discussed in details   History of present illness: 80 y.o. male with diabetes, hypertension, dyslipidemia, non-Hodgkin lymphoma who presented with 2 days duration of loose BM's earlier prior to this admission, associated with heaviness that is radiating from the neck to the frontal head area, blurry vision, malaise. These episodes are intermittent and lasting several minutes at the time. CT angiogram of the head done in the emergency room did not show any acute abnormalities.   Principal Problem:  Diarrhea  - C. Diff pending, stool panel also pending  - now better per patient and resolved  Active Problems:  Episodes of 5-6 second asymptomatic pauses, sick sinus syndrome  - Improved off of atenolol.  - will need 30 day event monitor per cardiology recommendations  Atrial flutter/ atrial fibrillation  - Documented on  telemetry  - Chads2vasc score is at least 4.  - anticoagulation with xarelto. CrCl is 81.1. Start xarelto 20mg  daily and stop ASA at discharge.  ? Seizures  - no epileptiform activity noted on EEG - neurology will sign off, outpatient follow up HYPERLIPIDEMIA  - continue statin  ATHEROSCLEROSIS, CORONARY, NATIVE ARTERY  - follow up on cardiology recommendations  Lymphoma malignant, large cell  - outpatient follow up  DM (diabetes mellitus) type 2, uncontrolled, with ketoacidosis  - continue Insulin as per home medical regimen  - also continue Neurontin for neuropathy  Hypokalemia  - supplemented and WNL this AM  Anemia of chronic disease  - Hg and Hct stable and at baseline  - no signs of active bleeding   Consultants:  Cardiology  Neurology  Procedures/Studies:  Ct Angio Head W/cm &/or Wo Cm 08/16/2013 No evidence of acute infarction. Generalized atrophy and chronic small vessel ischemic change. Atherosclerotic calcification of the vertebral and internal carotid arteries at the base of the brain. There is no flow-limiting stenosis. Maximal narrowing in the carotid siphon and supra clinoid internal carotid artery regions and in the distal vertebral artery regions is no more than 30-50%. Intracranial branch vessels are patent and unremarkable.  Antibiotics:  None  Code Status: DNR  Family Communication: Pt and family at bedside   Discharge Exam: Filed Vitals:   08/19/13 0937  BP: 124/69  Pulse: 81  Temp:   Resp:    Filed Vitals:   08/18/13 2130 08/19/13 0424 08/19/13 0614 08/19/13 0937  BP: 160/69  137/75 124/69  Pulse: 70  81 81  Temp:  99.2 F (37.3 C)  98.4 F (36.9 C)   TempSrc: Oral  Oral   Resp: 20  20   Height:      Weight:  87.317 kg (192 lb 8 oz)    SpO2: 97%  96%     General: Pt is alert, follows commands appropriately, not in acute distress Cardiovascular: Regular rate and rhythm, S1/S2 +, no murmurs, no rubs, no gallops Respiratory: Clear to  auscultation bilaterally, no wheezing, no crackles, no rhonchi Abdominal: Soft, non tender, non distended, bowel sounds +, no guarding Extremities: no edema, no cyanosis, pulses palpable bilaterally DP and PT Neuro: Grossly nonfocal  Discharge Instructions  Discharge Orders   Future Appointments Provider Department Dept Phone   08/23/2013 10:00 AM [facility] [phone]   Pt should arrive15 minutes prior to scheduled appt time. Please inform patient that exam will take a minimum of 1 1/2 hours. Patient to be NPO 6 hours prior to exam  and should not take any insulin the day of exam.   08/30/2013 8:30 AM [facility] [phone]   08/30/2013 9:00 AM [name], [facility] [phone]   09/13/2013 8:00 AM [facility] Nurse [facility] [phone]   10/02/2013 8:30 AM Burtis Junes, NP [address] Office [phone]   10/24/2013 8:00 AM [facility] Nurse [facility] [phone]   Future Orders Complete By Expires   Diet - low sodium heart healthy  As directed    Increase activity slowly  As directed        Medication List    STOP taking these medications       aspirin 81 MG tablet     atenolol 100 MG tablet  Commonly known as:  TENORMIN     prochlorperazine 5 MG tablet  Commonly known as:  COMPAZINE      TAKE these medications       allopurinol 300 MG tablet  Commonly known as:  ZYLOPRIM  Take 1 tablet (300 mg total) by mouth daily.     amLODipine 5 MG tablet  Commonly known as:  NORVASC  Take 2.5 mg by mouth daily.     atorvastatin 20 MG tablet  Commonly known as:  LIPITOR  Take 20 mg by mouth at bedtime.     cetirizine 10 MG tablet  Commonly known as:  ZYRTEC  Take 10 mg by mouth daily.     cholecalciferol 1000 UNITS tablet  Commonly known as:   VITAMIN D  Take 1,000 Units by mouth daily.     ezetimibe 10 MG tablet  Commonly known as:  ZETIA  Take 10 mg by mouth at bedtime.     fenofibrate 160 MG tablet  Take 160 mg by mouth at bedtime.     gabapentin 300 MG capsule  Commonly known as:  NEURONTIN  Take 300 mg by mouth 3 (three) times daily.     insulin NPH Human 100 UNIT/ML injection  Commonly known as:  HUMULIN N,NOVOLIN N  Inject 35-40 Units into the skin 2 (two) times daily before a meal. 35 units in the morning and 40 units at bedtime.     magnesium oxide 400 MG tablet  Commonly known as:  MAG-OX  Take 400 mg by mouth 2 (two) times daily.     metFORMIN 1000 MG tablet  Commonly known as:  GLUCOPHAGE  Take 1,000 mg by mouth 2 (two) times daily with a meal.     multivitamin with minerals tablet  Take 1 tablet by mouth daily.     ondansetron 4 MG tablet  Commonly known as:  ZOFRAN  Take 1 tablet (4 mg total) by mouth every 6 (six) hours as needed for nausea.     oxyCODONE 5 MG immediate release tablet  Commonly known as:  Oxy IR/ROXICODONE  Take 1 tablet (5 mg total) by mouth every 4 (four) hours as needed for moderate pain.     rivaroxaban 20 MG Tabs tablet  Commonly known as:  XARELTO  Take 1 tablet (20 mg total) by mouth daily with supper.     sertraline 100 MG tablet  Commonly known as:  ZOLOFT  Take 100 mg by mouth daily.     VITAMIN B 12 PO  Take 2,500 mg by mouth daily.     vitamin C 500 MG tablet  Commonly known as:  ASCORBIC ACID  Take 500 mg by mouth daily.           Follow-up Information   Schedule an appointment as soon as possible for a visit with Horatio Pel, MD.   Specialty:  Internal Medicine   Contact information:   [address] [place] [address] [phone]       Schedule an appointment as soon as possible for a visit with Thompson Grayer, MD.   Specialty:  Cardiology   Contact information:   [place]  [address] [phone]       Schedule an appointment as soon as possible for a visit with [place].   Contact information:   [address] [place] [address] [phone]      Follow up with Faye Ramsay, MD. (As needed, If symptoms worsen, call my cell phone [phone])    Specialty:  Internal Medicine   Contact information:   [address]  [address] [phone]        The results of significant diagnostics from this hospitalization (including imaging, microbiology, ancillary and laboratory) are listed below for reference.     Microbiology: Recent Results (from the past 240 hour(s))  MRSA PCR SCREENING     Status: None   Collection Time    08/17/13  1:48 PM      Result Value Ref Range Status   MRSA by PCR NEGATIVE  NEGATIVE Final   Comment:            The GeneXpert MRSA Assay (FDA     approved for NASAL specimens     only), is one component of a     comprehensive MRSA colonization     surveillance program. It is not     intended to diagnose MRSA     infection nor to guide or     monitor treatment for     MRSA infections.     Labs: Basic Metabolic Panel:  Recent Labs Lab 08/16/13 1253 08/17/13 0533 08/18/13 0507 08/19/13 0825  NA 138 143 140 137  K 3.1* 3.1* 3.9 3.7  CL 100 104 104 100  CO2 23 26 24 24   GLUCOSE 132* 129* 172* 207*  BUN 15 14 12 13   CREATININE 0.96 1.01 0.91 0.92  CALCIUM 9.0 9.0 9.1 9.6   Liver Function Tests:  Recent Labs Lab 08/16/13 1253  AST 24  ALT 21  ALKPHOS 54  BILITOT  0.3  PROT 5.9*  ALBUMIN 3.5    Recent Labs Lab 08/16/13 1253  LIPASE 30   CBC:  Recent Labs Lab 08/16/13 1253 08/17/13 0533 08/18/13 0507 08/19/13 0825  WBC 5.2 5.0 6.1 7.7  HGB 9.3* 9.6* 10.5* 11.8*  HCT 26.9* 28.0* 30.5* 33.8*  MCV 92.8 92.7 92.1 91.4  PLT 112* 90* 119* 123*   CBG:  Recent Labs Lab 08/18/13 0705 08/18/13 1128 08/18/13 1635 08/18/13 2129 08/19/13 0658   GLUCAP 180* 185* 205* 159* 154*   SIGNED: Time coordinating discharge: Over 30 minutes  Theodis Blaze, MD  [facility] 08/19/2013, 11:12 AM Pager [phone]  If 7PM-7AM, please contact night-coverage [email or web address] Password [email or web address]

## 2013-08-20 ENCOUNTER — Encounter: Payer: Self-pay | Admitting: *Deleted

## 2013-08-20 DIAGNOSIS — E119 Type 2 diabetes mellitus without complications: Secondary | ICD-10-CM | POA: Diagnosis not present

## 2013-08-20 NOTE — Procedures (Signed)
Long term EEG monitoring report.  Brief clinical history: 80 YO male with 4 day history of intermittent abnormal sensations of tingling and pain that radiates from the posterior aspect of head to the anterior (bilaterally), +/- blurred or loss vision. CTA head was negative. Given frequency and stereotypical pattern patient may be experiencing primary sensory seizures emanating from the parietal cortex and propagating to the occipital lobes No prior history of frank epileptic seizures.  The study began at 854 am 08/17/13 and ended at 950 am 08/19/13   Technique: this is a 18 channel long term EEG monitoring performed at the bedside with bipolar montages arranged in accordance to the international 10/20 system of electrode placement. One channel was dedicated to EKG recording.  The study was performed during wakefulness, drowsiness, and stage 2 sleep. Hyperventilation and intermittent photic stimulation were not utilized as activating procedures.  Description:In the wakeful state, the best background consisted of a medium amplitude, posterior dominant, well sustained, symmetric and reactive 11 Hz rhythm. Drowsiness demonstrated dropout of the alpha rhythm. The sleep portion of the record showed normal architecture. No focal or generalized epileptiform discharges noted.  No pathologic areas of slowing seen.  Patient did not have his habitual spells during the LTM. EKG showed sinus rhythm.  Impression: this is a normal long term EEG monitoring performed during aperiod of time in which patient did not sustain his habitual spells. Please, be aware that a normal EEG does not exclude the possibility of epilepsy.  Clinical correlation is advised.  Dorian Pod, MD

## 2013-08-20 NOTE — Progress Notes (Signed)
Patient ID: [name], male   DOB: November 29, 1931, 80 y.o.   MRN: [id number] Patient enrolled for [employer] to mail a 30 day cardiac event monitor to the home.

## 2013-08-23 ENCOUNTER — Ambulatory Visit (HOSPITAL_COMMUNITY)
Admission: RE | Admit: 2013-08-23 | Discharge: 2013-08-23 | Disposition: A | Discharge status: Home / Self Care | Payer: Medicare Other | Source: Ambulatory Visit | Attending: Oncology | Admitting: Oncology

## 2013-08-23 DIAGNOSIS — C8583 Other specified types of non-Hodgkin lymphoma, intra-abdominal lymph nodes: Secondary | ICD-10-CM | POA: Diagnosis not present

## 2013-08-23 DIAGNOSIS — Z87898 Personal history of other specified conditions: Secondary | ICD-10-CM

## 2013-08-23 DIAGNOSIS — C8589 Other specified types of non-Hodgkin lymphoma, extranodal and solid organ sites: Secondary | ICD-10-CM | POA: Diagnosis not present

## 2013-08-23 DIAGNOSIS — Z8546 Personal history of malignant neoplasm of prostate: Secondary | ICD-10-CM

## 2013-08-23 DIAGNOSIS — R590 Localized enlarged lymph nodes: Secondary | ICD-10-CM

## 2013-08-23 DIAGNOSIS — C8590 Non-Hodgkin lymphoma, unspecified, unspecified site: Secondary | ICD-10-CM

## 2013-08-23 DIAGNOSIS — E119 Type 2 diabetes mellitus without complications: Secondary | ICD-10-CM

## 2013-08-23 LAB — GLUCOSE, CAPILLARY: Glucose-Capillary: 167 mg/dL — ABNORMAL HIGH (ref 70–99)

## 2013-08-23 MED ORDER — FLUDEOXYGLUCOSE F - 18 (FDG) INJECTION
9.4000 | Freq: Once | INTRAVENOUS | Status: AC | PRN
  Administered 2013-08-23: 9.4 via INTRAVENOUS

## 2013-08-26 ENCOUNTER — Encounter (INDEPENDENT_AMBULATORY_CARE_PROVIDER_SITE_OTHER): Payer: Medicare Other

## 2013-08-26 DIAGNOSIS — I4892 Unspecified atrial flutter: Secondary | ICD-10-CM | POA: Diagnosis not present

## 2013-08-26 DIAGNOSIS — R55 Syncope and collapse: Secondary | ICD-10-CM | POA: Diagnosis not present

## 2013-08-26 DIAGNOSIS — C8589 Other specified types of non-Hodgkin lymphoma, extranodal and solid organ sites: Secondary | ICD-10-CM | POA: Diagnosis not present

## 2013-08-26 DIAGNOSIS — I10 Essential (primary) hypertension: Secondary | ICD-10-CM | POA: Diagnosis not present

## 2013-08-26 DIAGNOSIS — Z8249 Family history of ischemic heart disease and other diseases of the circulatory system: Secondary | ICD-10-CM | POA: Diagnosis not present

## 2013-08-26 DIAGNOSIS — R609 Edema, unspecified: Secondary | ICD-10-CM | POA: Diagnosis not present

## 2013-08-26 DIAGNOSIS — E7800 Pure hypercholesterolemia, unspecified: Secondary | ICD-10-CM | POA: Diagnosis not present

## 2013-08-26 DIAGNOSIS — E1149 Type 2 diabetes mellitus with other diabetic neurological complication: Secondary | ICD-10-CM | POA: Diagnosis not present

## 2013-08-26 DIAGNOSIS — I259 Chronic ischemic heart disease, unspecified: Secondary | ICD-10-CM | POA: Diagnosis not present

## 2013-08-29 ENCOUNTER — Other Ambulatory Visit: Payer: Self-pay | Admitting: *Deleted

## 2013-08-29 DIAGNOSIS — C8590 Non-Hodgkin lymphoma, unspecified, unspecified site: Secondary | ICD-10-CM

## 2013-08-30 ENCOUNTER — Ambulatory Visit (HOSPITAL_BASED_OUTPATIENT_CLINIC_OR_DEPARTMENT_OTHER): Payer: Medicare Other | Admitting: Oncology

## 2013-08-30 ENCOUNTER — Telehealth: Payer: Self-pay | Admitting: Oncology

## 2013-08-30 ENCOUNTER — Other Ambulatory Visit (HOSPITAL_BASED_OUTPATIENT_CLINIC_OR_DEPARTMENT_OTHER): Payer: Medicare Other

## 2013-08-30 ENCOUNTER — Telehealth: Payer: Self-pay | Admitting: *Deleted

## 2013-08-30 VITALS — HR 69 | Temp 97.8°F | Resp 18 | Ht 68.0 in | Wt 193.1 lb

## 2013-08-30 DIAGNOSIS — C8590 Non-Hodgkin lymphoma, unspecified, unspecified site: Secondary | ICD-10-CM

## 2013-08-30 DIAGNOSIS — Z8546 Personal history of malignant neoplasm of prostate: Secondary | ICD-10-CM

## 2013-08-30 DIAGNOSIS — C8580 Other specified types of non-Hodgkin lymphoma, unspecified site: Secondary | ICD-10-CM

## 2013-08-30 DIAGNOSIS — C8589 Other specified types of non-Hodgkin lymphoma, extranodal and solid organ sites: Secondary | ICD-10-CM

## 2013-08-30 DIAGNOSIS — Z87898 Personal history of other specified conditions: Secondary | ICD-10-CM

## 2013-08-30 DIAGNOSIS — E119 Type 2 diabetes mellitus without complications: Secondary | ICD-10-CM

## 2013-08-30 LAB — CBC WITH DIFFERENTIAL/PLATELET
BASO%: 0.9 % (ref 0.0–2.0)
Basophils Absolute: 0.1 10*3/uL (ref 0.0–0.1)
EOS%: 4 % (ref 0.0–7.0)
Eosinophils Absolute: 0.2 10*3/uL (ref 0.0–0.5)
HCT: 31 % — ABNORMAL LOW (ref 38.4–49.9)
HGB: 10.6 g/dL — ABNORMAL LOW (ref 13.0–17.1)
LYMPH%: 4.2 % — ABNORMAL LOW (ref 14.0–49.0)
MCH: 31.5 pg (ref 27.2–33.4)
MCHC: 34.4 g/dL (ref 32.0–36.0)
MCV: 91.6 fL (ref 79.3–98.0)
MONO#: 0.5 10*3/uL (ref 0.1–0.9)
MONO%: 8.5 % (ref 0.0–14.0)
NEUT%: 82.4 % — ABNORMAL HIGH (ref 39.0–75.0)
NEUTROS ABS: 4.6 10*3/uL (ref 1.5–6.5)
Platelets: 164 10*3/uL (ref 140–400)
RBC: 3.38 10*6/uL — AB (ref 4.20–5.82)
RDW: 14 % (ref 11.0–14.6)
WBC: 5.6 10*3/uL (ref 4.0–10.3)
lymph#: 0.2 10*3/uL — ABNORMAL LOW (ref 0.9–3.3)

## 2013-08-30 LAB — COMPREHENSIVE METABOLIC PANEL (CC13)
ALBUMIN: 4 g/dL (ref 3.5–5.0)
ALK PHOS: 62 U/L (ref 40–150)
ALT: 27 U/L (ref 0–55)
AST: 33 U/L (ref 5–34)
Anion Gap: 11 mEq/L (ref 3–11)
BUN: 14.2 mg/dL (ref 7.0–26.0)
CO2: 24 mEq/L (ref 22–29)
Calcium: 9.5 mg/dL (ref 8.4–10.4)
Chloride: 105 mEq/L (ref 98–109)
Creatinine: 1.4 mg/dL — ABNORMAL HIGH (ref 0.7–1.3)
Glucose: 161 mg/dl — ABNORMAL HIGH (ref 70–140)
POTASSIUM: 3.8 meq/L (ref 3.5–5.1)
SODIUM: 140 meq/L (ref 136–145)
TOTAL PROTEIN: 6.7 g/dL (ref 6.4–8.3)
Total Bilirubin: 0.44 mg/dL (ref 0.20–1.20)

## 2013-08-30 LAB — LACTATE DEHYDROGENASE (CC13): LDH: 167 U/L (ref 125–245)

## 2013-08-30 NOTE — Telephone Encounter (Signed)
Per staff message and POF I have scheduled appts.  JMW  

## 2013-08-30 NOTE — Addendum Note (Signed)
Addended by: Laureen Abrahams on: 08/30/2013 05:25 PM   Modules accepted: Orders, Medications

## 2013-08-30 NOTE — Progress Notes (Signed)
ID: [name] OB: [birth year]  MR#: [id number]  OZY#:[id number]  PCP: Horatio Pel, MD GYN:   SU: [name] OTHER MD: [name], [name], [name],  Maine Croitoru  CHIEF COMPLAINT:  "IMy heart is stopping sometimes"   NON-HODGKIN"S LYMPHOMA HISTORY:  The patient developed right upper quadrant pain last year and he had an ultrasound April 5th which showed some gallstones without evidence of cholecystitis or ductal dilatation.  However, there were multiple lesions in the liver which could not be assessed further.  Accordingly on July 31, 2003, [name], showed multiple liver masses, more than 25, most over 1 cm, the largest being in the inferior right lobe, measuring 4.2 cm. There was also a small mass in the spleen.  CT of the pelvis was unremarkable.   The patient had a biopsy of the liver August 01, 2003.  The report says only that it was "a lesion in the right lobe of the liver".  Presumably this was the largest lesion present.  The final pathology [id number] and [id number]) showed only cirrhosis.     The patient has been followed by [name], and a repeat CT scan of the abdomen and pelvis was obtained May 18, 2004.  Many of the liver lesions seen previously had actually decreased in size.  However, [name], [name], a second ultrasound-guided biopsy was performed 06/11/04. This was a lesion deep in the left lobe of the liver and therefore, I would think not the same one previously biopsied which was in the right liver. The pathology this time [id number]) shows a poorly differentiated neuroendocrine  carcinoma which was positive for chromogranin A, negative for synaptophysin, thyroid transcription factor, a variety of cytokeratins, [name], alpha-fetoprotein and COX-2."  Mr. Nong was subsequently evaluated at [facility] by Dr. Otelia Limes and repeat liver biopsy and review of the earlier biopsy here showed a primary hepatic lymphoma. The patient was treated with R-CHOP as detailed below and achieved a complete response. He received maintenance rituximab until 2008  More recently the patient has been complaining of worsening back pain. It felt "like a kidney stone".  INTERVAL HISTORY. AC returns today with his daughter for followup of his breast cancer. Since his last visit here of course he had a near syncopal episode, presented to the emergency room April 24, ended up being admitted, and after a thorough neurologic in cardiology evaluation was felt to have sick sinus syndrome as well as of course atrial flutter and fibrillation. He currently has a monitor on and will be following with Dr. [name] in the near future  REVIEW OF SYSTEMS:  AC tells me he has had no fevers, drenching sweats, unexplained fatigue or unexplained weight loss, or any adenopathy that he is aware of. He is very active chiefly in his garden. Of course he and his daughter inseparable. His daughters keep a close watch on him. Aside from the heart issues, he has mild seasonal allergies and feels a little forgetful at times. He tells me his diabetes is "just fine right now". A detailed review of systems was otherwise  noncontributory  PAST MEDICAL HISTORY: Past Medical History  Diagnosis Date  . Hypertension   . Cancer of liver   . Kidney stone   . Prostate cancer   . Skin cancer   . Arthritis   . Anemia   . GERD (gastroesophageal reflux disease)   . Lymphoma   . [name]   . Sleep apnea   . Neuropathy in diabetes     Hx: of  . Diabetes mellitus     INSULIN DEPENDENT  . HX, PERSONAL, MALIGNANCY, PROSTATE  07/28/2006    Annotation: 2001, resected Qualifier: Diagnosis of  By: [name], [name]    Significant for prostate cancer, the patient undergoing prostatectomy January 2001 under [facility] for a Gleason 7, [name], minor coronary artery disease, hypertension, hypertriglyceridemia, cholelithiasis, colon polyps, cirrhosis by biopsy, diabetes, [name], history of nephrolithiasis, status post right renal surgery and history of left rotator cuff repair under [name].    PAST SURGICAL HISTORY: Past Surgical History  Procedure Laterality Date  . Prostatectomy    . Kidney stone surgery    . Rotator cuff repair    . Ankle surgery    . Cardiac catheterization      Hx: of 1970's  . Colonoscopy      Hx: of  . Infusion port  04/04/2013    RIGHT SUBCLAVIAN  . Cholecystectomy  04/04/2013  . Cholecystectomy N/A 04/04/2013    Procedure: LAPAROSCOPIC CHOLECYSTECTOMY WITH INTRAOPERATIVE CHOLANGIOGRAM;  [name], MD;  Location: [place];  Service: General;  [name];  . Portacath placement N/A 04/04/2013    Procedure: INSERTION PORT-A-CATH;  [name], MD;  Location: [place];  Service: General;  [name];    FAMILY HISTORY Family History  Problem Relation Age of Onset  . Heart disease Father   The patient's father died at the age of 102 from an MI.  The patient's mother died from "old age" at 101.  The patient is one of nine siblings.  One brother died from cancer of the esophagus, 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  He is now  retired.  His wife, [name], a homemaker, is present today as are the patient's three daughters: [name] is an Scientist, physiological for [employer] [facility] [employer] [employer]; [name], [name]; and [name] is Environmental consultant.  Everybody lives in [place].  The patient has five grandchildren.  He is a member of [place].    ADVANCED DIRECTIVES: in place   HEALTH MAINTENANCE: History  Substance Use Topics  . Smoking status: Former Smoker    Types: Cigarettes, Pipe, Landscape architect  . Smokeless tobacco: Never Used     Comment: QUIT SMOKING MANY YEARS AGO "  . Alcohol Use: No     Colonoscopy:  PSA: 8.01 (NOV 2014)  Bone density:  Lipid panel:  Allergies  Allergen Reactions  . Niacin Other (See Comments)    headaches    Current Outpatient Prescriptions  Medication Sig Dispense Refill  . allopurinol (ZYLOPRIM) 300 MG  tablet Take 1 tablet (300 mg total) by mouth daily.  30 tablet  3  . amLODipine (NORVASC) 5 MG tablet Take 2.5 mg by mouth daily.       Marland Kitchen atorvastatin (LIPITOR) 20 MG tablet Take 20 mg by mouth at bedtime.      . cetirizine (ZYRTEC) 10 MG tablet Take 10 mg by mouth daily.      . cholecalciferol (VITAMIN D) 1000 UNITS tablet Take 1,000 Units by mouth daily.      . Cyanocobalamin (VITAMIN B 12 PO) Take 2,500 mg by mouth daily.       Marland Kitchen ezetimibe (ZETIA) 10 MG tablet Take 10 mg by mouth at bedtime.      . fenofibrate 160 MG tablet Take 160 mg by mouth at bedtime.      . gabapentin (NEURONTIN) 300 MG capsule Take 300 mg by mouth 3 (three) times daily.       . insulin NPH Human (HUMULIN N,NOVOLIN N) 100 UNIT/ML injection Inject 35-40 Units into the skin 2 (two) times daily before a meal. 35 units in the morning and 40 units at bedtime.      . magnesium oxide (MAG-OX) 400 MG tablet Take 400 mg by mouth 2 (two) times daily.      . metFORMIN (GLUCOPHAGE) 1000 MG tablet Take 1,000 mg by mouth 2 (two) times daily  with a meal.      . Multiple Vitamins-Minerals (MULTIVITAMIN WITH MINERALS) tablet Take 1 tablet by mouth daily.      . ondansetron (ZOFRAN) 4 MG tablet Take 1 tablet (4 mg total) by mouth every 6 (six) hours as needed for nausea.  60 tablet  1  . oxyCODONE (OXY IR/ROXICODONE) 5 MG immediate release tablet Take 1 tablet (5 mg total) by mouth every 4 (four) hours as needed for moderate pain.  30 tablet  0  . rivaroxaban (XARELTO) 20 MG TABS tablet Take 1 tablet (20 mg total) by mouth daily with supper.  30 tablet  1  . sertraline (ZOLOFT) 100 MG tablet Take 100 mg by mouth daily.      . vitamin C (ASCORBIC ACID) 500 MG tablet Take 500 mg by mouth daily.       No current facility-administered medications for this visit.    Objective: Middle-aged white male who appears stated age  37 Vitals:   08/30/13 0854  Pulse: 69  Temp: 97.8 F (36.6 C)  Resp: 18     Body mass index is 29.37 kg/(m^2).      ECOG FS:1 - Symptomatic but completely ambulatory  HEENT PERRLA, sclerae anicteric, pupils round and reactive Ear-nose-throat: His rhinophyma has completely cleared The oropharynx is clear Lymphatic: No cervical or supraclavicular adenopathy; no axillary adenopathy; no inguinal adenopathy Lungs no rales or rhonchi, good excursion bilaterally Heart regular rate and rhythm today, no murmur appreciated Abdomen:  soft, nontender, positive bowel sounds, no masses palpated MSK no focal spinal tenderness Neuro: non-focal, well-oriented, positive affect  LAB RESULTS:  CMP     Component Value Date/Time   NA 137 08/19/2013 0825   NA 135* 07/02/2013 1335   K 3.7 08/19/2013 0825   K 4.7 07/02/2013 1335   CL 100 08/19/2013 0825   CO2 24 08/19/2013 0825   CO2 21* 07/02/2013 1335   GLUCOSE 207* 08/19/2013 0825   GLUCOSE 161* 07/02/2013 1335   BUN 13 08/19/2013 0825   BUN 22.1 07/02/2013 1335   CREATININE 0.92 08/19/2013 0825   CREATININE 1.3 07/02/2013 1335  CALCIUM 9.6 08/19/2013 0825   CALCIUM 9.6  07/02/2013 1335   CALCIUM 11.0* 08/17/2005 0842   PROT 5.9* 08/16/2013 1253   PROT 6.7 07/02/2013 1335   ALBUMIN 3.5 08/16/2013 1253   ALBUMIN 3.8 07/02/2013 1335   AST 24 08/16/2013 1253   AST 23 07/02/2013 1335   ALT 21 08/16/2013 1253   ALT 30 07/02/2013 1335   ALKPHOS 54 08/16/2013 1253   ALKPHOS 57 07/02/2013 1335   BILITOT 0.3 08/16/2013 1253   BILITOT 0.41 07/02/2013 1335   GFRNONAA 78* 08/19/2013 0825   GFRAA >90 08/19/2013 0825    No results found for this basename: SPEP,  UPEP,   kappa and lambda light chains    Lab Results  Component Value Date   WBC 5.6 08/30/2013   NEUTROABS 4.6 08/30/2013   HGB 10.6* 08/30/2013   HCT 31.0* 08/30/2013   MCV 91.6 08/30/2013   PLT 164 08/30/2013      Chemistry      Component Value Date/Time   NA 137 08/19/2013 0825   NA 135* 07/02/2013 1335   K 3.7 08/19/2013 0825   K 4.7 07/02/2013 1335   CL 100 08/19/2013 0825   CO2 24 08/19/2013 0825   CO2 21* 07/02/2013 1335   BUN 13 08/19/2013 0825   BUN 22.1 07/02/2013 1335   CREATININE 0.92 08/19/2013 0825   CREATININE 1.3 07/02/2013 1335      Component Value Date/Time   CALCIUM 9.6 08/19/2013 0825   CALCIUM 9.6 07/02/2013 1335   CALCIUM 11.0* 08/17/2005 0842   ALKPHOS 54 08/16/2013 1253   ALKPHOS 57 07/02/2013 1335   AST 24 08/16/2013 1253   AST 23 07/02/2013 1335   ALT 21 08/16/2013 1253   ALT 30 07/02/2013 1335   BILITOT 0.3 08/16/2013 1253   BILITOT 0.41 07/02/2013 1335      Urinalysis    Component Value Date/Time   COLORURINE YELLOW 08/16/2013 [place] 08/16/2013 1317   LABSPEC 1.009 08/16/2013 1317   PHURINE 6.5 08/16/2013 1317   GLUCOSEU NEGATIVE 08/16/2013 [place] 08/16/2013 [place] 08/16/2013 1317   KETONESUR NEGATIVE 08/16/2013 1317   PROTEINUR NEGATIVE 08/16/2013 1317   UROBILINOGEN 0.2 08/16/2013 1317   NITRITE NEGATIVE 08/16/2013 1317   LEUKOCYTESUR NEGATIVE 08/16/2013 1317    STUDIES: Ct Angio Head W/cm &/or Wo Cm  08/16/2013   CLINICAL DATA:   Blurred vision.  Dizziness.  Lymphoma.  EXAM: CT ANGIOGRAPHY HEAD  TECHNIQUE: Multidetector CT imaging of the head was performed using the standard protocol during bolus administration of intravenous contrast. Multiplanar CT image reconstructions and MIPs were obtained to evaluate the vascular anatomy.  CONTRAST:  42m OMNIPAQUE IOHEXOL 350 MG/ML SOLN  COMPARISON:  Head CT 04/01/2009  FINDINGS: The brain shows generalized atrophy. There chronic small vessel changes throughout the hemispheric deep white matter. No sign of acute infarction, mass lesion, hemorrhage, hydrocephalus or extra-axial collection.  Both cervical internal carotid arteries are ectatic with a diameter as large as 8 mm. There is atherosclerotic calcification beneath the skullbase bilaterally. On the right, there is probably a calcified pseudo aneurysm without a prominent lumen.  There is atherosclerotic calcification in the carotid siphon regions without stenosis. Calcification extends into the supra clinoid internal carotid arteries but there is not narrowing more than about 30%.  The anterior and middle cerebral vessels are patent bilaterally without proximal stenosis, aneurysm or vascular malformation.  Both vertebral arteries are patent through the foramen magnum.  There is atherosclerotic calcification of the distal vertebral arteries bilaterally with narrowing of 30-50% bilaterally. The basilar artery is widely patent without stenosis or irregularity. Superior cerebellar and posterior cerebral arteries appear patent and normal.  Venous structures appear normal.  Review of the MIP images confirms the above findings.  IMPRESSION: No evidence of acute infarction. Generalized atrophy and chronic small vessel ischemic change.  Atherosclerotic calcification of the vertebral and internal carotid arteries at the base of the brain. There is no flow-limiting stenosis. Maximal narrowing in the carotid siphon and supra clinoid internal carotid artery  regions and in the distal vertebral artery regions is no more than 30-50%.  Intracranial branch vessels are patent and unremarkable.  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   Electronically Signed   By: Nelson Chimes M.D.   On: 08/16/2013 15:10   Nm Pet Image Restag (ps) Skull Base To Thigh  08/23/2013   CLINICAL DATA:  Initial treatment strategy for lymphoma.  EXAM: NUCLEAR MEDICINE PET SKULL BASE TO THIGH  TECHNIQUE: 9.4 mCi F-18 FDG was injected intravenously. Full-ring PET imaging was performed from the skull base to thigh after the radiotracer. CT data was obtained and used for attenuation correction and anatomic localization.  FASTING BLOOD GLUCOSE:  Value: 167 mg/dl  COMPARISON:  CT 05/23/2013  FINDINGS: NECK  No hypermetabolic lymph nodes in the neck.  CHEST  No hypermetabolic mediastinal or hilar nodes. No suspicious pulmonary nodules on the CT scan. There is a focus of ground-glass opacity in the left upper lobe measuring 2.5 cm which is new from prior (image 51 of series 4) bibasilar atelectasis.  ABDOMEN/PELVIS  The soft tissue thickening position between the IVC and the aorta is ill-defined and measures roughly 18 mm x 26 mm compared to 33 mm by 29 mm on prior for reduction in volume. This wasprevious site of bulky lymphadenopathy. The metabolic activity continues to decrease with SUV max equal 3.2 compared to SUV max equal no 6.3. The metabolic activity is now less than or equal to the liver ( [place] 3)  NO hypermetabolic lymph nodes in the abdomen pelvis. The spleen is normal volume and normal metabolic activity. There is calcification a contraction of the left hepatic lobe unchanged from prior and without metabolic activity.  SKELETON  No focal hypermetabolic activity to suggest skeletal metastasis.  IMPRESSION: 1. Continued positive response to chemotherapy. 2. Periaortic mass is decreased in  metabolic activity now with metabolic activity less than or equal to liver (Deauville 3) compared to De[place] 4 on prior. 3. Ground-glass focus in the right upper lobe is likely infectious or inflammatory.   Electronically Signed   By: Suzy Bouchard M.D.   On: 08/23/2013 13:09   Dg Chest Portable 1 View  08/16/2013   CLINICAL DATA:  Dehydration  EXAM: PORTABLE CHEST - 1 VIEW  COMPARISON:  06/18/2013  FINDINGS: The cardiac shadow is within normal limits. The lungs are clear bilaterally. A right-sided chest wall port is again seen and stable.  IMPRESSION: No active disease.   Electronically Signed   By: Inez Catalina M.D.   On: 08/16/2013 13:09   ASSESSMENT:  80 y.o. patient with a diagnosis of    #1 Primary hepatic non-Hodgkin's lymphoma established through liver biopsy February 2006, treated with Rituxan x9 and then CHOP x6.  All chemotherapy completed in 2007.  Maintenance Rituxan discontinued October 2008.     #2  Biopsy proven  retroperitoneal recurrence documented November 2014. Bone marrow biopsy 04/01/2013 was negative   #3 status post  laparoscopic cholecystectomy and port placement on 04/04/2013.  #4 Cycle #1  RICE chemotherapy  completed on 05/02/2013.   #5 cycle #2 RICEchemotherapy  on 05/17/2013  #6 PET scan performed on 05/23/2013 revealed marked partial response to chemotherapy with a retroperitoneal mass decrease in metabolic activity from SUV of 13.2 to 6.3. Right adrenal mass size and metabolic activity is resolved when compared to the previous PET scan.   #7 Evaluated at [facility] by Dr. Tomasa Hosteller for autologous transplant on 05/27/2013 and was quoted a 3% chance of significant heart damage and possibly death from the treatment and a 50% chance of being alive 5 years from now after transplant (versus perhaps 2 years without). After much thought the patient and family have decided firmly they do not wish to proceed to stem cell transplant.  #8 cycle 3  RICE chemotherapy completed on   06/06/2013   #9 facial cellulitis with MRSA February 2015 treated with vancomycin IV x14 days completed 07/02/2013, followed by doxycycline for 2 additional weeks  #10 to start maintenance Rituxan May 2015  PLAN: [facility] is doing fine as far as his lymphoma is concerned, and we reviewed the PET scan, which remains very favorable.  We are going to give him rituximab every 2 months as maintenance to try to prolong the response. Today we picked the treatment dates through September, and he will see me again with a September dose. We will do lab work every 2 months, review of systems and physical exam every 6 months, but I would not plan to repeat a pad for at least a year or earlier if there are specific symptoms to evaluate. We also reviewed what "B." symptoms are and AC (or one of his daughters) will call if any of those develop.  AC has a good understanding of the overall plan. He agrees with it. He knows a goal of treatment in his case is control. He will call with any problems that may develop before his next visit here.  [name], MD   08/30/2013

## 2013-08-30 NOTE — Telephone Encounter (Signed)
, °

## 2013-09-02 ENCOUNTER — Telehealth: Payer: Self-pay | Admitting: *Deleted

## 2013-09-02 NOTE — Telephone Encounter (Signed)
Per staff message I have moved appt from 9/8 to 9/18

## 2013-09-05 ENCOUNTER — Other Ambulatory Visit: Payer: Self-pay | Admitting: Oncology

## 2013-09-05 NOTE — Progress Notes (Unsigned)
ID: [name] OB: 12-28-1931  MR#: [id number]  JXB#:[id number]  PCP: Michael Pel, MD GYN:   SU: [name] OTHER MD: [name], [name], [name],  [name]  CHIEF COMPLAINT:  "IMy heart is stopping sometimes"  THIS IS A CORRECTED NOTE: PRIOR NOTE STATED "BREAST CANCER" INSTEAD OF LYMPHOMA IN INTERVAL HISTORY  NON-HODGKIN"S LYMPHOMA HISTORY:  The patient developed right upper quadrant pain last year and he had an ultrasound April 5th which showed some gallstones without evidence of cholecystitis or ductal dilatation.  However, there were multiple lesions in the liver which could not be assessed further.  Accordingly on July 31, 2003, [name], showed multiple liver masses, more than 25, most over 1 cm, the largest being in the inferior right lobe, measuring 4.2 cm. There was also a small mass in the spleen.  CT of the pelvis was unremarkable.   The patient had a biopsy of the liver August 01, 2003.  The report says only that it was "a lesion in the right lobe of the liver".  Presumably this was the largest lesion present.  The final pathology [id number] and [phone]) showed only cirrhosis.     The patient has been followed by [name], and a repeat CT scan of the abdomen and pelvis was obtained May 18, 2004.  Many of the liver lesions seen previously had actually decreased in size.  However, [name], [name], a second ultrasound-guided biopsy was performed 06/11/04. This was a lesion deep in the left lobe of the liver and therefore, I would think not the same one previously biopsied which was in  the right liver. The pathology this time [id number]) shows a poorly differentiated neuroendocrine carcinoma which was positive for chromogranin A, negative for synaptophysin, thyroid transcription factor, a variety of cytokeratins, [name], alpha-fetoprotein and COX-2."  [name] was subsequently evaluated at [name] by [name] and repeat liver biopsy and review of the earlier biopsy here showed a primary hepatic lymphoma. The patient was treated with [name] as detailed below and achieved a complete response. He received maintenance rituximab until 2008  More recently the patient has been complaining of worsening back pain. It felt "like a kidney stone".  INTERVAL HISTORY. [name] returns today with his daughter for followup of his non-Hodgkin's lymphoma. Since his last visit here of course he had a near syncopal episode, presented to the emergency room April 24, ended up being admitted, and after a thorough neurologic in cardiology evaluation was felt to have sick sinus syndrome as well as of course atrial flutter and fibrillation. He currently has a monitor on and will be following with [name] in the near future  REVIEW OF SYSTEMS:  [name] tells me he has had no fevers, drenching sweats, unexplained fatigue or unexplained weight loss, or any adenopathy that he is aware of. He is very active chiefly in his garden. Of course he and his daughter inseparable. His daughters keep a close watch on him. Aside from the heart issues, he has mild seasonal allergies and feels a little forgetful at times. He  tells me his diabetes is "just fine right now". A detailed review of systems was otherwise noncontributory  PAST MEDICAL HISTORY: Past Medical History  Diagnosis Date  . Hypertension   . Cancer of liver   . Kidney stone   . Prostate cancer   . Skin cancer   . Arthritis   . Anemia   . GERD (gastroesophageal reflux disease)   . Lymphoma   . [name]   . Sleep apnea   . Neuropathy in  diabetes     Hx: of  . Diabetes mellitus     INSULIN DEPENDENT  . HX, PERSONAL, MALIGNANCY, PROSTATE 07/28/2006    Annotation: 2001, resected Qualifier: Diagnosis of  By: Michael Sima MD, [name]    Significant for prostate cancer, the patient undergoing prostatectomy January 2001 under [name] for a Gleason 7, [name], minor coronary artery disease, hypertension, hypertriglyceridemia, cholelithiasis, colon polyps, cirrhosis by biopsy, diabetes, squamous cell carcinomas of the skin removed by [name], history of nephrolithiasis, status post right renal surgery and history of left rotator cuff repair under [name].    PAST SURGICAL HISTORY: Past Surgical History  Procedure Laterality Date  . Prostatectomy    . Kidney stone surgery    . Rotator cuff repair    . Ankle surgery    . Cardiac catheterization      Hx: of 1970's  . Colonoscopy      Hx: of  . Infusion port  04/04/2013    RIGHT SUBCLAVIAN  . Cholecystectomy  04/04/2013  . Cholecystectomy N/A 04/04/2013    Procedure: LAPAROSCOPIC CHOLECYSTECTOMY WITH INTRAOPERATIVE CHOLANGIOGRAM;  Surgeon: Michael Regal, MD;  Location: [name];  Service: General;  [name];  . Portacath placement N/A 04/04/2013    Procedure: INSERTION PORT-A-CATH;  Surgeon: Michael Regal, MD;  Location: [name];  Service: General;  [name];    FAMILY HISTORY Family History  Problem Relation Age of Onset  . Heart disease Father   The patient's father died at the age of 87 from an MI.  The patient's mother died from "old age" at 9.  The patient is one of nine siblings.  One brother died from cancer of the esophagus, 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  He is now retired.  His wife, [name], a homemaker, is present today as are the patient's three daughters: [name] is an Scientist, physiological for [employer]; [name], [name]; and [name] is Environmental consultant.  Everybody lives in [place].  The patient has five grandchildren.  He is a member of [place].    ADVANCED DIRECTIVES: in place   HEALTH MAINTENANCE: History  Substance Use Topics  . Smoking status: Former Smoker    Types: Cigarettes, Pipe, Landscape architect  . Smokeless tobacco: Never Used     Comment: QUIT SMOKING MANY YEARS AGO "  . Alcohol Use: No     Colonoscopy:  PSA: 8.01 (NOV 2014)  Bone density:  Lipid panel:  Allergies  Allergen Reactions  . Niacin Other (See Comments)    headaches  Current Outpatient Prescriptions  Medication Sig Dispense Refill  . allopurinol (ZYLOPRIM) 300 MG tablet Take 1 tablet (300 mg total) by mouth daily.  30 tablet  3  . amLODipine (NORVASC) 5 MG tablet Take 2.5 mg by mouth daily.       Marland Kitchen atenolol (TENORMIN) 100 MG tablet       . atorvastatin (LIPITOR) 20 MG tablet Take 20 mg by mouth at bedtime.      . cetirizine (ZYRTEC) 10 MG tablet Take 10 mg by mouth daily.      . cholecalciferol (VITAMIN D) 1000 UNITS tablet Take 1,000 Units by mouth daily.      . Cyanocobalamin (VITAMIN B 12 PO) Take 2,500 mg by mouth daily.       Marland Kitchen ezetimibe (ZETIA) 10 MG tablet Take 10 mg by mouth at bedtime.      . fenofibrate 160 MG tablet Take 160 mg by mouth at bedtime.      . gabapentin (NEURONTIN) 300 MG capsule Take 300 mg by mouth 3 (three) times daily.       . insulin NPH Human (HUMULIN N,NOVOLIN N) 100 UNIT/ML injection Inject 35-40 Units into the skin 2 (two) times daily before a meal. 35 units in the morning and 40 units at bedtime.      . magnesium oxide (MAG-OX) 400 MG  tablet Take 400 mg by mouth 2 (two) times daily.      . metFORMIN (GLUCOPHAGE) 1000 MG tablet Take 1,000 mg by mouth 2 (two) times daily with a meal.      . Multiple Vitamins-Minerals (MULTIVITAMIN WITH MINERALS) tablet Take 1 tablet by mouth daily.      Marland Kitchen oxyCODONE (OXY IR/ROXICODONE) 5 MG immediate release tablet Take 1 tablet (5 mg total) by mouth every 4 (four) hours as needed for moderate pain.  30 tablet  0  . sertraline (ZOLOFT) 100 MG tablet Take 100 mg by mouth daily.      . vitamin C (ASCORBIC ACID) 500 MG tablet Take 500 mg by mouth daily.       No current facility-administered medications for this visit.    Objective: Middle-aged white male who appears stated age  There were no vitals filed for this visit.   There is no weight on file to calculate BMI.      ECOG FS:1 - Symptomatic but completely ambulatory  HEENT PERRLA, sclerae anicteric, pupils round and reactive Ear-nose-throat: His rhinophyma has completely cleared The oropharynx is clear Lymphatic: No cervical or supraclavicular adenopathy; no axillary adenopathy; no inguinal adenopathy Lungs no rales or rhonchi, good excursion bilaterally Heart regular rate and rhythm today, no murmur appreciated Abdomen:  soft, nontender, positive bowel sounds, no masses palpated MSK no focal spinal tenderness Neuro: non-focal, well-oriented, positive affect  LAB RESULTS:  CMP     Component Value Date/Time   NA 140 08/30/2013 0843   NA 137 08/19/2013 0825   K 3.8 08/30/2013 0843   K 3.7 08/19/2013 0825   CL 100 08/19/2013 0825   CO2 24 08/30/2013 0843   CO2 24 08/19/2013 0825   GLUCOSE 161* 08/30/2013 0843   GLUCOSE 207* 08/19/2013 0825   BUN 14.2 08/30/2013 0843   BUN 13 08/19/2013 0825   CREATININE 1.4* 08/30/2013 0843   CREATININE 0.92 08/19/2013 0825   CALCIUM 9.5 08/30/2013 0843   CALCIUM 9.6 08/19/2013 0825   CALCIUM 11.0* 08/17/2005 0842   PROT 6.7 08/30/2013 0843   PROT 5.9* 08/16/2013 1253   ALBUMIN 4.0 08/30/2013  0843   ALBUMIN 3.5  08/16/2013 1253   AST 33 08/30/2013 0843   AST 24 08/16/2013 1253   ALT 27 08/30/2013 0843   ALT 21 08/16/2013 1253   ALKPHOS 62 08/30/2013 0843   ALKPHOS 54 08/16/2013 1253   BILITOT 0.44 08/30/2013 0843   BILITOT 0.3 08/16/2013 1253   GFRNONAA 78* 08/19/2013 0825   GFRAA >90 08/19/2013 0825    No results found for this basename: SPEP,  UPEP,   kappa and lambda light chains    Lab Results  Component Value Date   WBC 5.6 08/30/2013   NEUTROABS 4.6 08/30/2013   HGB 10.6* 08/30/2013   HCT 31.0* 08/30/2013   MCV 91.6 08/30/2013   PLT 164 08/30/2013      Chemistry      Component Value Date/Time   NA 140 08/30/2013 0843   NA 137 08/19/2013 0825   K 3.8 08/30/2013 0843   K 3.7 08/19/2013 0825   CL 100 08/19/2013 0825   CO2 24 08/30/2013 0843   CO2 24 08/19/2013 0825   BUN 14.2 08/30/2013 0843   BUN 13 08/19/2013 0825   CREATININE 1.4* 08/30/2013 0843   CREATININE 0.92 08/19/2013 0825      Component Value Date/Time   CALCIUM 9.5 08/30/2013 0843   CALCIUM 9.6 08/19/2013 0825   CALCIUM 11.0* 08/17/2005 0842   ALKPHOS 62 08/30/2013 0843   ALKPHOS 54 08/16/2013 1253   AST 33 08/30/2013 0843   AST 24 08/16/2013 1253   ALT 27 08/30/2013 0843   ALT 21 08/16/2013 1253   BILITOT 0.44 08/30/2013 0843   BILITOT 0.3 08/16/2013 1253      Urinalysis    Component Value Date/Time   COLORURINE YELLOW 08/16/2013 1317   APPEARANCEUR CLEAR 08/16/2013 1317   LABSPEC 1.009 08/16/2013 1317   PHURINE 6.5 08/16/2013 1317   GLUCOSEU NEGATIVE 08/16/2013 1317   HGBUR NEGATIVE 08/16/2013 1317   BILIRUBINUR NEGATIVE 08/16/2013 1317   KETONESUR NEGATIVE 08/16/2013 1317   PROTEINUR NEGATIVE 08/16/2013 1317   UROBILINOGEN 0.2 08/16/2013 1317   NITRITE NEGATIVE 08/16/2013 1317   LEUKOCYTESUR NEGATIVE 08/16/2013 1317    STUDIES: Ct Angio Head W/cm &/or Wo Cm  08/16/2013   CLINICAL DATA:  Blurred vision.  Dizziness.  Lymphoma.  EXAM: CT ANGIOGRAPHY HEAD  TECHNIQUE: Multidetector CT imaging of the head was performed using the standard protocol during  bolus administration of intravenous contrast. Multiplanar CT image reconstructions and MIPs were obtained to evaluate the vascular anatomy.  CONTRAST:  4mL OMNIPAQUE IOHEXOL 350 MG/ML SOLN  COMPARISON:  Head CT 04/01/2009  FINDINGS: The brain shows generalized atrophy. There chronic small vessel changes throughout the hemispheric deep white matter. No sign of acute infarction, mass lesion, hemorrhage, hydrocephalus or extra-axial collection.  Both cervical internal carotid arteries are ectatic with a diameter as large as 8 mm. There is atherosclerotic calcification beneath the skullbase bilaterally. On the right, there is probably a calcified pseudo aneurysm without a prominent lumen.  There is atherosclerotic calcification in the carotid siphon regions without stenosis. Calcification extends into the supra clinoid internal carotid arteries but there is not narrowing more than about 30%.  The anterior and middle cerebral vessels are patent bilaterally without proximal stenosis, aneurysm or vascular malformation.  Both vertebral arteries are patent through the foramen magnum. There is atherosclerotic calcification of the distal vertebral arteries bilaterally with narrowing of 30-50% bilaterally. The basilar artery is widely patent without stenosis or irregularity. Superior cerebellar and posterior cerebral arteries appear patent  and normal.  Venous structures appear normal.  Review of the MIP images confirms the above findings.  IMPRESSION: No evidence of acute infarction. Generalized atrophy and chronic small vessel ischemic change.  Atherosclerotic calcification of the vertebral and internal carotid arteries at the base of the brain. There is no flow-limiting stenosis. Maximal narrowing in the carotid siphon and supra clinoid internal carotid artery regions and in the distal vertebral artery regions is no more than 30-50%.  Intracranial branch vessels are patent and unremarkable.  Calcification of the cervical  internal carotid arteries bilaterally, with a small pseudoaneurysm on the right, not likely significant.   Electronically Signed   By: Nelson Chimes M.D.   On: 08/16/2013 15:10   Nm Pet Image Restag (ps) Skull Base To Thigh  08/23/2013   CLINICAL DATA:  Initial treatment strategy for lymphoma.  EXAM: NUCLEAR MEDICINE PET SKULL BASE TO THIGH  TECHNIQUE: 9.4 mCi F-18 FDG was injected intravenously. Full-ring PET imaging was performed from the skull base to thigh after the radiotracer. CT data was obtained and used for attenuation correction and anatomic localization.  FASTING BLOOD GLUCOSE:  Value: 167 mg/dl  COMPARISON:  CT 05/23/2013  FINDINGS: NECK  No hypermetabolic lymph nodes in the neck.  CHEST  No hypermetabolic mediastinal or hilar nodes. No suspicious pulmonary nodules on the CT scan. There is a focus of ground-glass opacity in the left upper lobe measuring 2.5 cm which is new from prior (image 51 of series 4) bibasilar atelectasis.  ABDOMEN/PELVIS  The soft tissue thickening position between the IVC and the aorta is ill-defined and measures roughly 18 mm x 26 mm compared to 33 mm by 29 mm on prior for reduction in volume. This wasprevious site of bulky lymphadenopathy. The metabolic activity continues to decrease with SUV max equal 3.2 compared to SUV max equal no 6.3. The metabolic activity is now less than or equal to the liver ( Deauville 3)  NO hypermetabolic lymph nodes in the abdomen pelvis. The spleen is normal volume and normal metabolic activity. There is calcification a contraction of the left hepatic lobe unchanged from prior and without metabolic activity.  SKELETON  No focal hypermetabolic activity to suggest skeletal metastasis.  IMPRESSION: 1. Continued positive response to chemotherapy. 2. Periaortic mass is decreased in metabolic activity now with metabolic activity less than or equal to liver (Deauville 3) compared to Deauville 4 on prior. 3. Ground-glass focus in the right upper lobe is  likely infectious or inflammatory.   Electronically Signed   By: Suzy Bouchard M.D.   On: 08/23/2013 13:09   Dg Chest Portable 1 View  08/16/2013   CLINICAL DATA:  Dehydration  EXAM: PORTABLE CHEST - 1 VIEW  COMPARISON:  06/18/2013  FINDINGS: The cardiac shadow is within normal limits. The lungs are clear bilaterally. A right-sided chest wall port is again seen and stable.  IMPRESSION: No active disease.   Electronically Signed   By: Inez Catalina M.D.   On: 08/16/2013 13:09   ASSESSMENT:  80 y.o. patient with a diagnosis of    #1 Primary hepatic non-Hodgkin's lymphoma established through liver biopsy February 2006, treated with Rituxan x9 and then CHOP x6.  All chemotherapy completed in 2007.  Maintenance Rituxan discontinued October 2008.     #2  Biopsy proven retroperitoneal recurrence documented November 2014. Bone marrow biopsy 04/01/2013 was negative   #3 status post  laparoscopic cholecystectomy and port placement on 04/04/2013.  #4 Cycle #1  RICE chemotherapy  completed  on 05/02/2013.   #5 cycle #2 RICEchemotherapy  on 05/17/2013  #6 PET scan performed on 05/23/2013 revealed marked partial response to chemotherapy with a retroperitoneal mass decrease in metabolic activity from SUV of 13.2 to 6.3. Right adrenal mass size and metabolic activity is resolved when compared to the previous PET scan.   #7 Evaluated at [name] by Dr. Tomasa Hosteller for autologous transplant on 05/27/2013 and was quoted a 3% chance of significant heart damage and possibly death from the treatment and a 50% chance of being alive 5 years from now after transplant (versus perhaps 2 years without). After much thought the patient and family have decided firmly they do not wish to proceed to stem cell transplant.  #8 cycle 3  RICE chemotherapy completed on  06/06/2013   #9 facial cellulitis with MRSA February 2015 treated with vancomycin IV x14 days completed 07/02/2013, followed by doxycycline for 2 additional  weeks  #10 to start maintenance Rituxan May 2015  PLAN: [facility] is doing fine as far as his lymphoma is concerned, and we reviewed the PET scan, which remains very favorable.  We are going to give him rituximab every 2 months as maintenance to try to prolong the response. Today we picked the treatment dates through September, and he will see me again with a September dose. We will do lab work every 2 months, review of systems and physical exam every 6 months, but I would not plan to repeat a pad for at least a year or earlier if there are specific symptoms to evaluate. We also reviewed what "B." symptoms are and [name] (or one of his daughters) will call if any of those develop.  [name] has a good understanding of the overall plan. He agrees with it. He knows a goal of treatment in his case is control. He will call with any problems that may develop before his next visit here.  [name], MD   09/05/2013

## 2013-09-13 ENCOUNTER — Other Ambulatory Visit (HOSPITAL_BASED_OUTPATIENT_CLINIC_OR_DEPARTMENT_OTHER): Payer: Medicare Other

## 2013-09-13 ENCOUNTER — Ambulatory Visit (HOSPITAL_BASED_OUTPATIENT_CLINIC_OR_DEPARTMENT_OTHER): Payer: Medicare Other

## 2013-09-13 VITALS — BP 154/68 | HR 65 | Temp 97.7°F | Resp 18

## 2013-09-13 DIAGNOSIS — Z5112 Encounter for antineoplastic immunotherapy: Secondary | ICD-10-CM

## 2013-09-13 DIAGNOSIS — C8589 Other specified types of non-Hodgkin lymphoma, extranodal and solid organ sites: Secondary | ICD-10-CM

## 2013-09-13 DIAGNOSIS — Z87898 Personal history of other specified conditions: Secondary | ICD-10-CM | POA: Diagnosis not present

## 2013-09-13 DIAGNOSIS — C8580 Other specified types of non-Hodgkin lymphoma, unspecified site: Secondary | ICD-10-CM

## 2013-09-13 DIAGNOSIS — Z8546 Personal history of malignant neoplasm of prostate: Secondary | ICD-10-CM | POA: Diagnosis not present

## 2013-09-13 DIAGNOSIS — E119 Type 2 diabetes mellitus without complications: Secondary | ICD-10-CM

## 2013-09-13 DIAGNOSIS — C8590 Non-Hodgkin lymphoma, unspecified, unspecified site: Secondary | ICD-10-CM

## 2013-09-13 LAB — CBC WITH DIFFERENTIAL/PLATELET
BASO%: 0.7 % (ref 0.0–2.0)
BASOS ABS: 0 10*3/uL (ref 0.0–0.1)
EOS%: 6.8 % (ref 0.0–7.0)
Eosinophils Absolute: 0.3 10*3/uL (ref 0.0–0.5)
HEMATOCRIT: 35.2 % — AB (ref 38.4–49.9)
HEMOGLOBIN: 11.8 g/dL — AB (ref 13.0–17.1)
LYMPH#: 0.2 10*3/uL — AB (ref 0.9–3.3)
LYMPH%: 4.8 % — ABNORMAL LOW (ref 14.0–49.0)
MCH: 30.4 pg (ref 27.2–33.4)
MCHC: 33.4 g/dL (ref 32.0–36.0)
MCV: 91 fL (ref 79.3–98.0)
MONO#: 0.4 10*3/uL (ref 0.1–0.9)
MONO%: 9.1 % (ref 0.0–14.0)
NEUT#: 3.8 10*3/uL (ref 1.5–6.5)
NEUT%: 78.6 % — AB (ref 39.0–75.0)
Platelets: 152 10*3/uL (ref 140–400)
RBC: 3.87 10*6/uL — ABNORMAL LOW (ref 4.20–5.82)
RDW: 14.4 % (ref 11.0–14.6)
WBC: 4.8 10*3/uL (ref 4.0–10.3)

## 2013-09-13 LAB — COMPREHENSIVE METABOLIC PANEL (CC13)
ALBUMIN: 4.1 g/dL (ref 3.5–5.0)
ALT: 39 U/L (ref 0–55)
AST: 33 U/L (ref 5–34)
Alkaline Phosphatase: 77 U/L (ref 40–150)
Anion Gap: 11 mEq/L (ref 3–11)
BUN: 14 mg/dL (ref 7.0–26.0)
CALCIUM: 9.6 mg/dL (ref 8.4–10.4)
CHLORIDE: 106 meq/L (ref 98–109)
CO2: 23 mEq/L (ref 22–29)
Creatinine: 1.2 mg/dL (ref 0.7–1.3)
Glucose: 215 mg/dl — ABNORMAL HIGH (ref 70–140)
POTASSIUM: 3.6 meq/L (ref 3.5–5.1)
Sodium: 140 mEq/L (ref 136–145)
Total Bilirubin: 0.33 mg/dL (ref 0.20–1.20)
Total Protein: 6.9 g/dL (ref 6.4–8.3)

## 2013-09-13 LAB — LACTATE DEHYDROGENASE (CC13): LDH: 138 U/L (ref 125–245)

## 2013-09-13 MED ORDER — SODIUM CHLORIDE 0.9 % IJ SOLN
10.0000 mL | INTRAMUSCULAR | Status: DC | PRN
  Administered 2013-09-13: 10 mL
  Filled 2013-09-13: qty 10

## 2013-09-13 MED ORDER — ACETAMINOPHEN 325 MG PO TABS
650.0000 mg | ORAL_TABLET | Freq: Once | ORAL | Status: AC
  Administered 2013-09-13: 650 mg via ORAL

## 2013-09-13 MED ORDER — SODIUM CHLORIDE 0.9 % IV SOLN
Freq: Once | INTRAVENOUS | Status: AC
  Administered 2013-09-13: 09:00:00 via INTRAVENOUS

## 2013-09-13 MED ORDER — SODIUM CHLORIDE 0.9 % IV SOLN
375.0000 mg/m2 | Freq: Once | INTRAVENOUS | Status: AC
  Administered 2013-09-13: 800 mg via INTRAVENOUS
  Filled 2013-09-13: qty 80

## 2013-09-13 MED ORDER — HEPARIN SOD (PORK) LOCK FLUSH 100 UNIT/ML IV SOLN
500.0000 [IU] | Freq: Once | INTRAVENOUS | Status: AC | PRN
  Administered 2013-09-13: 500 [IU]
  Filled 2013-09-13: qty 5

## 2013-09-13 MED ORDER — ACETAMINOPHEN 325 MG PO TABS
ORAL_TABLET | ORAL | Status: AC
  Filled 2013-09-13: qty 2

## 2013-09-13 MED ORDER — DIPHENHYDRAMINE HCL 25 MG PO CAPS
ORAL_CAPSULE | ORAL | Status: AC
  Filled 2013-09-13: qty 1

## 2013-09-13 MED ORDER — DIPHENHYDRAMINE HCL 25 MG PO CAPS
25.0000 mg | ORAL_CAPSULE | Freq: Once | ORAL | Status: AC
  Administered 2013-09-13: 25 mg via ORAL

## 2013-09-13 NOTE — Patient Instructions (Signed)
[facility] Discharge Instructions for Patients Receiving Chemotherapy  Today you received the following chemotherapy agents: Rituxan. To help prevent nausea and vomiting after your treatment, we encourage you to take your nausea medication.   If you develop nausea and vomiting that is not controlled by your nausea medication, call the clinic.   BELOW ARE SYMPTOMS THAT SHOULD BE REPORTED IMMEDIATELY:  *FEVER GREATER THAN 100.5 F  *CHILLS WITH OR WITHOUT FEVER  NAUSEA AND VOMITING THAT IS NOT CONTROLLED WITH YOUR NAUSEA MEDICATION  *UNUSUAL [name]  *UNUSUAL BRUISING OR BLEEDING  TENDERNESS IN MOUTH AND THROAT WITH OR WITHOUT PRESENCE OF ULCERS  *URINARY PROBLEMS  *BOWEL PROBLEMS  UNUSUAL RASH Items with * indicate a potential emergency and should be followed up as soon as possible.  Feel free to call the clinic you have any questions or concerns. The clinic phone number is [phone].    

## 2013-09-15 ENCOUNTER — Other Ambulatory Visit (HOSPITAL_COMMUNITY): Payer: Self-pay | Admitting: Oncology

## 2013-09-18 LAB — BETA 2 MICROGLOBULIN, SERUM: BETA 2 MICROGLOBULIN: 4.38 mg/L — AB (ref <=2.51)

## 2013-09-19 DIAGNOSIS — E119 Type 2 diabetes mellitus without complications: Secondary | ICD-10-CM | POA: Diagnosis not present

## 2013-09-19 DIAGNOSIS — I10 Essential (primary) hypertension: Secondary | ICD-10-CM | POA: Diagnosis not present

## 2013-09-19 DIAGNOSIS — E7800 Pure hypercholesterolemia, unspecified: Secondary | ICD-10-CM | POA: Diagnosis not present

## 2013-09-30 ENCOUNTER — Telehealth: Payer: Self-pay | Admitting: *Deleted

## 2013-09-30 NOTE — Telephone Encounter (Signed)
Dr. Rayann Heman reviewed monitor -- findings: predominantly sinus rhythm.   A Fib observed with controlled ventricular response.    Rare PVCs. No pauses, no AV block, or other arrhythmias to explain syncope.

## 2013-10-01 ENCOUNTER — Telehealth: Payer: Self-pay | Admitting: Cardiovascular Disease

## 2013-10-01 NOTE — Telephone Encounter (Signed)
Message deferred to [facility] Triage pool as patient had [employer] monitor placed at [employer].

## 2013-10-01 NOTE — Telephone Encounter (Signed)
[name] has worn a monitor for a month and is asking for the results . He would like to get back to driving. Please call    Thanks

## 2013-10-01 NOTE — Telephone Encounter (Signed)
Called [name] (patient's daughter). She states that she called the [facility] office because she wants to keep his care with Dr.C.  [name] will call them again about follow up and let us know later if she wants him to keep the appointment with Truitt Merle NP on 6/10. Will forward message back to [employer].

## 2013-10-01 NOTE — Telephone Encounter (Signed)
RN spoke with patient's daughter. Patient wants to get cleared to go back to driving. RN informed her of monitor results documented in EPIC by Venida Jarvis, RN that Dr. Rayann Heman reviewed, but informed her that Dr. [name] has not yet seen the actual monitor strips/may not be aware of him wearing the monitor. Advised her that patient should keep appmt with Truitt Merle, NP to f/up on monitor and then scheduled further f/up visits with Dr. [name].   Will ask to have monitor results faxed to [facility] at [phone] for Dr. [name] to review

## 2013-10-02 ENCOUNTER — Ambulatory Visit (INDEPENDENT_AMBULATORY_CARE_PROVIDER_SITE_OTHER): Payer: Medicare Other | Admitting: Nurse Practitioner

## 2013-10-02 ENCOUNTER — Encounter: Payer: Self-pay | Admitting: Nurse Practitioner

## 2013-10-02 ENCOUNTER — Encounter (HOSPITAL_COMMUNITY): Payer: Self-pay

## 2013-10-02 VITALS — BP 110/70 | HR 81 | Ht 68.0 in | Wt 187.0 lb

## 2013-10-02 DIAGNOSIS — R55 Syncope and collapse: Secondary | ICD-10-CM | POA: Diagnosis not present

## 2013-10-02 DIAGNOSIS — I2510 Atherosclerotic heart disease of native coronary artery without angina pectoris: Secondary | ICD-10-CM | POA: Diagnosis not present

## 2013-10-02 NOTE — Progress Notes (Signed)
[name] Date of Birth: March 09, 1932 Medical Record [id number]  History of Present Illness: [name] is seen back today for a post hospital visit - seen for Dr. Rayann Heman. He is a 80 year old male with DM, HTN, HLD, non-Hodgkin lymphoma and CAD. He is actually followed by Dr. [name].  Most recently presented with 2 days of loose BMs, syncope - negative CT angiogram of the head. Noted to have episodes of 5 - 6 second pauses - beta blocker stopped and was seen by Dr. Rayann Heman - referred for event monitoring. Diarrhea resolved. CHADsVASC at least 4 and Xarelto was started. Aspirin stopped. Negative EEG.   Comes back today. Here with daughter. Doing ok. He tells me that he actually never really passed out - sounded more like presyncope - regardless- he has had no recurrence. Feels good. Wants to drive. No chest pain. Rare palpitations. Ok on the [place] - has had labs at the [facility]. BP ok. He has no complaint.   Current Outpatient Prescriptions  Medication Sig Dispense Refill  . allopurinol (ZYLOPRIM) 300 MG tablet TAKE 1 TABLET (300 MG TOTAL) BY MOUTH DAILY.  30 tablet  3  . amLODipine (NORVASC) 5 MG tablet Take 2.5 mg by mouth daily.       Marland Kitchen atorvastatin (LIPITOR) 20 MG tablet Take 20 mg by mouth at bedtime.      . cetirizine (ZYRTEC) 10 MG tablet Take 10 mg by mouth daily.      . cholecalciferol (VITAMIN D) 1000 UNITS tablet Take 1,000 Units by mouth daily.      . Cyanocobalamin (VITAMIN B 12 PO) Take 2,500 mg by mouth daily.       Marland Kitchen ezetimibe (ZETIA) 10 MG tablet Take 10 mg by mouth at bedtime.      . fenofibrate 160 MG tablet Take 160 mg by mouth at bedtime.      . gabapentin (NEURONTIN) 300 MG capsule Take 300 mg by mouth 3 (three) times daily.       . insulin NPH Human (HUMULIN N,NOVOLIN N) 100 UNIT/ML injection Inject 40 Units into the skin 2 (two) times daily before a meal. 35 units in the morning and 40 units at bedtime.      . magnesium oxide (MAG-OX) 400 MG tablet Take  400 mg by mouth 2 (two) times daily.      . metFORMIN (GLUCOPHAGE) 1000 MG tablet Take 1,000 mg by mouth 2 (two) times daily with a meal.      . Multiple Vitamins-Minerals (MULTIVITAMIN WITH MINERALS) tablet Take 1 tablet by mouth daily.      . sertraline (ZOLOFT) 100 MG tablet Take 100 mg by mouth daily.      . vitamin C (ASCORBIC ACID) 500 MG tablet Take 500 mg by mouth daily.      Alveda Reasons 20 MG TABS tablet Take 20 mg by mouth daily with supper.        No current facility-administered medications for this visit.    Allergies  Allergen Reactions  . Niacin Other (See Comments)    headaches    Past Medical History  Diagnosis Date  . Hypertension   . Cancer of liver   . Kidney stone   . Prostate cancer   . Skin cancer   . Arthritis   . Anemia   . GERD (gastroesophageal reflux disease)   . Lymphoma   . [name]   . Sleep apnea   . Neuropathy in diabetes  Hx: of  . Diabetes mellitus     INSULIN DEPENDENT  . HX, PERSONAL, MALIGNANCY, PROSTATE 07/28/2006    Annotation: 2001, resected Qualifier: Diagnosis of  By: [name], [name]      Past Surgical History  Procedure Laterality Date  . Prostatectomy    . Kidney stone surgery    . Rotator cuff repair    . Ankle surgery    . Cardiac catheterization      Hx: of 1970's  . Colonoscopy      Hx: of  . Infusion port  04/04/2013    RIGHT SUBCLAVIAN  . Cholecystectomy  04/04/2013  . Cholecystectomy N/A 04/04/2013    Procedure: LAPAROSCOPIC CHOLECYSTECTOMY WITH INTRAOPERATIVE CHOLANGIOGRAM;  [name], MD;  Location: [place];  Service: General;  [name];  . Portacath placement N/A 04/04/2013    Procedure: INSERTION PORT-A-CATH;  [name], MD;  Location: [place];  Service: General;  [name];    History  Smoking status  . Former Smoker  . Types: Cigarettes, Pipe, Cigars  Smokeless tobacco  . Never Used    Comment: QUIT SMOKING MANY YEARS AGO "    History  Alcohol  Use No    Family History  Problem Relation Age of Onset  . Heart disease Father     Review of Systems: The review of systems is per the HPI.  All other systems were reviewed and are negative.  Physical Exam: BP 110/70  Pulse 81  Ht 5\' 8"  (1.727 m)  Wt 187 lb (84.823 kg)  BMI 28.44 kg/m2  SpO2 94% Patient is very pleasant and in no acute distress. Skin is warm and dry. Color is normal.  HEENT is unremarkable. Normocephalic/atraumatic. PERRL. Sclera are nonicteric. Neck is supple. No masses. No JVD. Lungs are clear. Cardiac exam shows a regular rate and rhythm. Abdomen is soft. Extremities are without edema. Gait and ROM are intact. No gross neurologic deficits noted.   LABORATORY DATA: Lab Results  Component Value Date   WBC 4.8 09/13/2013   HGB 11.8* 09/13/2013   HCT 35.2* 09/13/2013   PLT 152 09/13/2013   GLUCOSE 215* 09/13/2013   ALT 39 09/13/2013   AST 33 09/13/2013   NA 140 09/13/2013   K 3.6 09/13/2013   CL 100 08/19/2013   CREATININE 1.2 09/13/2013   BUN 14.0 09/13/2013   CO2 23 09/13/2013   PSA 7.77* 04/29/2013   INR 0.98 04/01/2013   HGBA1C 6.4* 08/16/2013      Assessment / Plan:  1. Syncope - in the setting of diarrhea and noted occasional nocturnal post termination pauses and sinus pauses - his event monitor was reviewed by Dr. Rayann Heman - predominantly NSR with AF with controlled VR noted - rare PVCs, no pauses, AV block or other arrhythmia to explain his syncope. No longer on chronotropic agents and will need to avoid. Follow up back with his regular cardiologist. Madaline Brilliant to drive.  2. CAD - no chest pain  3. HTN - BP ok  4. Non-hodgkin's lymphoma  Patient is agreeable to this plan and will call if any problems develop in the interim.   Burtis Junes, RN, [place] [address] [place], [place]  [phone]

## 2013-10-02 NOTE — Patient Instructions (Signed)
Stay on your current medicines  Ok to drive  Call the [place] office at [phone] if you have any questions, problems or concerns.

## 2013-10-18 ENCOUNTER — Ambulatory Visit (INDEPENDENT_AMBULATORY_CARE_PROVIDER_SITE_OTHER): Payer: Medicare Other | Admitting: Neurology

## 2013-10-18 ENCOUNTER — Encounter: Payer: Self-pay | Admitting: Neurology

## 2013-10-18 VITALS — BP 129/72 | HR 75 | Ht 67.0 in | Wt 186.0 lb

## 2013-10-18 DIAGNOSIS — D518 Other vitamin B12 deficiency anemias: Secondary | ICD-10-CM

## 2013-10-18 DIAGNOSIS — I2510 Atherosclerotic heart disease of native coronary artery without angina pectoris: Secondary | ICD-10-CM

## 2013-10-18 DIAGNOSIS — R413 Other amnesia: Secondary | ICD-10-CM

## 2013-10-18 DIAGNOSIS — R55 Syncope and collapse: Secondary | ICD-10-CM | POA: Diagnosis not present

## 2013-10-18 DIAGNOSIS — R6889 Other general symptoms and signs: Secondary | ICD-10-CM

## 2013-10-18 HISTORY — DX: Syncope and collapse: R55

## 2013-10-18 HISTORY — DX: Other amnesia: R41.3

## 2013-10-18 NOTE — Patient Instructions (Signed)
Near-Syncope Near-syncope (commonly known as near fainting) is sudden weakness, dizziness, or feeling like you might pass out. During an episode of near-syncope, you may also develop pale skin, have tunnel vision, or feel sick to your stomach (nauseous). Near-syncope may occur when getting up after sitting or while standing for a long time. It is caused by a sudden decrease in blood flow to the brain. This decrease can result from various causes or triggers, most of which are not serious. However, because near-syncope can sometimes be a sign of something serious, a medical evaluation is required. The specific cause is often not determined. HOME CARE INSTRUCTIONS  Monitor your condition for any changes. The following actions may help to alleviate any discomfort you are experiencing:  Have someone stay with you until you feel stable.  Lie down right away and prop your feet up if you start feeling like you might faint. Breathe deeply and steadily. Wait until all the symptoms have passed. Most of these episodes last only a few minutes. You may feel tired for several hours.   Drink enough fluids to keep your urine clear or pale yellow.   If you are taking blood pressure or heart medicine, get up slowly when seated or lying down. Take several minutes to sit and then stand. This can reduce dizziness.  Follow up with your health care provider as directed. SEEK IMMEDIATE MEDICAL CARE IF:   You have a severe headache.   You have unusual pain in the chest, abdomen, or back.   You are bleeding from the mouth or rectum, or you have black or tarry stool.   You have an irregular or very fast heartbeat.   You have repeated fainting or have seizure-like jerking during an episode.   You faint when sitting or lying down.   You have confusion.   You have difficulty walking.   You have severe weakness.   You have vision problems.  MAKE SURE YOU:   Understand these instructions.  Will  watch your condition.  Will get help right away if you are not doing well or get worse. Document Released: 04/11/2005 Document Revised: 04/16/2013 Document Reviewed: 09/14/2012 ExitCare Patient Information 2015 [employer]. This information is not intended to replace advice given to you by your health care provider. Make sure you discuss any questions you have with your health care provider.  

## 2013-10-18 NOTE — Progress Notes (Signed)
Reason for visit: Near-syncope  [name] is a 80 y.o. male  History of present illness:  [name] is a 81 year old white male with a history of non-Hodgkin's lymphoma. The patient began chemotherapy in January 2015. The patient began having episodes of diarrhea following onset of chemotherapy. In April of 2015, the patient was admitted to the hospital around the 24th of the month with episodes of near-syncope. The patient indicated that he would have episodes of lightheaded sensations, mild diaphoresis, and then visual blurring with eventual blindness. The patient never actually passed out. The patient reported no focal numbness or weakness of the face, arms, or legs. He has a history of diabetes, and he indicates that blood sugar checks around that time were unremarkable. The patient was found to have 5-6 second pauses associated with the use of a beta blocker. The atenolol was discontinued, and the patient has not had any further events of near-syncope since that time. The patient refrained from driving for about 90 days, but he is back to operating a motor vehicle at this time. The patient was told to followup with a neurologist following that April admission. He had an EEG study during that admission that was normal, and a CT angiogram of the head and neck and carotid Doppler studies that were unremarkable. A CT of the head was unremarkable with the exception of some atrophy and small vessel disease.  He does have a history of some problems with memory over the last one year. The patient denies problems getting lost while driving. He has never done the finances or kept up with his medications which are now being handled by his daughters as his wife is also demented. The patient does indicate that he has had some increased forgetfulness over the last one year. The patient has a history of a peripheral neuropathy associated with his diabetes, and he does have some discomfort in the feet at  night. He reports a mild gait problem as well.  Past Medical History  Diagnosis Date  . Hypertension   . Cancer of liver   . Kidney stone   . Prostate cancer   . Skin cancer   . Arthritis   . Anemia   . GERD (gastroesophageal reflux disease)   . Lymphoma   . [name]   . Sleep apnea   . Neuropathy in diabetes     Hx: of  . Diabetes mellitus     INSULIN DEPENDENT  . HX, PERSONAL, MALIGNANCY, PROSTATE 07/28/2006    Annotation: 2001, resected Qualifier: Diagnosis of  By: [name], [name]    . A-fib   . Near syncope 10/18/2013  . Memory deficit 10/18/2013  . HOH (hard of hearing)     Past Surgical History  Procedure Laterality Date  . Prostatectomy    . Kidney stone surgery    . Rotator cuff repair    . Ankle surgery    . Cardiac catheterization      Hx: of 1970's  . Colonoscopy      Hx: of  . Infusion port  04/04/2013    RIGHT SUBCLAVIAN  . Cholecystectomy  04/04/2013  . Cholecystectomy N/A 04/04/2013    Procedure: LAPAROSCOPIC CHOLECYSTECTOMY WITH INTRAOPERATIVE CHOLANGIOGRAM;  [name], MD;  Location: [place];  Service: General;  [name];  . Portacath placement N/A 04/04/2013    Procedure: INSERTION PORT-A-CATH;  [name], MD;  Location: [place] [facility];  Service: General;  Laterality:  N/A;    Family History  Problem Relation Age of Onset  . Heart disease Father   . Heart attack Father   . Cancer Sister   . Heart attack Brother   . Heart attack Brother   . Heart attack Brother   . Heart attack Brother   . Heart attack Brother   . Cancer Brother   . Cancer Brother     Social history:  reports that he has quit smoking. His smoking use included Pipe. He has quit using smokeless tobacco. His smokeless tobacco use included Chew. He reports that he does not drink alcohol or use illicit drugs.  Medications:  Current Outpatient Prescriptions on File Prior to Visit  Medication Sig Dispense Refill  . allopurinol (ZYLOPRIM) 300 MG  tablet TAKE 1 TABLET (300 MG TOTAL) BY MOUTH DAILY.  30 tablet  3  . amLODipine (NORVASC) 5 MG tablet Take 2.5 mg by mouth daily.       Marland Kitchen atorvastatin (LIPITOR) 20 MG tablet Take 20 mg by mouth at bedtime.      . cetirizine (ZYRTEC) 10 MG tablet Take 10 mg by mouth daily.      . cholecalciferol (VITAMIN D) 1000 UNITS tablet Take 1,000 Units by mouth daily.      . Cyanocobalamin (VITAMIN B 12 PO) Take 2,500 mg by mouth daily.       Marland Kitchen ezetimibe (ZETIA) 10 MG tablet Take 10 mg by mouth at bedtime.      . fenofibrate 160 MG tablet Take 160 mg by mouth at bedtime.      . gabapentin (NEURONTIN) 300 MG capsule Take 300 mg by mouth 3 (three) times daily.       . insulin NPH Human (HUMULIN N,NOVOLIN N) 100 UNIT/ML injection Inject 40 Units into the skin 2 (two) times daily before a meal. 35 units in the morning and 40 units at bedtime.      . magnesium oxide (MAG-OX) 400 MG tablet Take 400 mg by mouth 2 (two) times daily.      . metFORMIN (GLUCOPHAGE) 1000 MG tablet Take 1,000 mg by mouth 2 (two) times daily with a meal.      . Multiple Vitamins-Minerals (MULTIVITAMIN WITH MINERALS) tablet Take 1 tablet by mouth daily.      . sertraline (ZOLOFT) 100 MG tablet Take 100 mg by mouth daily.      . vitamin C (ASCORBIC ACID) 500 MG tablet Take 500 mg by mouth daily.      Alveda Reasons 20 MG TABS tablet Take 20 mg by mouth daily with supper.        No current facility-administered medications on file prior to visit.      Allergies  Allergen Reactions  . Niacin Other (See Comments)    headaches    ROS:  Out of a complete 14 system review of symptoms, the patient complains only of the following symptoms, and all other reviewed systems are negative.  Hearing loss Snoring Diarrhea Arthritis Runny nose Memory loss, near syncope Anxiety, racing thoughts Snoring  Blood pressure 129/72, pulse 75, height 5\' 7"  (1.702 m), weight 186 lb (84.369 kg).  Physical Exam  General: The patient is alert and  cooperative at the time of the examination.  Eyes: Pupils are equal, round, and reactive to light. Discs are flat bilaterally.  Neck: The neck is supple, no carotid bruits are noted.  Respiratory: The respiratory examination is clear.  Cardiovascular: The cardiovascular examination reveals an irregular rate and  rhythm, no obvious murmurs or rubs are noted.  Skin: Extremities are without significant edema.  Neurologic Exam  Mental status: The Mini-Mental status examination done today shows a total score 21/30.  Cranial nerves: Facial symmetry is present. There is good sensation of the face to pinprick and soft touch bilaterally. The strength of the facial muscles and the muscles to head turning and shoulder shrug are normal bilaterally. Speech is well enunciated, no aphasia or dysarthria is noted. Extraocular movements are full. Visual fields are full. The tongue is midline, and the patient has symmetric elevation of the soft palate. No obvious hearing deficits are noted.  Motor: The motor testing reveals 5 over 5 strength of all 4 extremities. Good symmetric motor tone is noted throughout.  Sensory: Sensory testing is intact to pinprick, soft touch, vibration sensation, and position sense on the upper extremities. With the lower extremities, the patient has a stocking pattern pinprick sensory deficit two thirds the way up the legs bilaterally, with significant impairment of vibration sensation in both feet. Position sensation is decreased in the right foot greater than the left foot. No evidence of extinction is noted.  Coordination: Cerebellar testing reveals good finger-nose-finger and heel-to-shin bilaterally.  Gait and station: Gait is slightly wide-based. Tandem gait is unsteady. Romberg is negative. No drift is seen.  Reflexes: Deep tendon reflexes are symmetric, but are depressed bilaterally. Toes are downgoing bilaterally.   CT angiogram 08/17/2013:  IMPRESSION:  No evidence  of acute infarction. Generalized atrophy and chronic  small vessel ischemic change.  Atherosclerotic calcification of the vertebral and internal carotid  arteries at the base of the brain. There is no flow-limiting  stenosis. Maximal narrowing in the carotid siphon and supra clinoid  internal carotid artery regions and in the distal vertebral artery  regions is no more than 30-50%.  Intracranial branch vessels are patent and unremarkable.  Calcification of the cervical internal carotid arteries bilaterally,  with a small pseudoaneurysm on the right, not likely significant.    Assessment/Plan:  1. Episodes of near-syncope  2. Memory disturbance  3. Peripheral neuropathy, likely secondary to diabetes  4. History of diabetes  The patient has had episodes of near-syncope that were likely related to bradycardia associated with the use of a beta blocker. The patient has done well off of this medication. He does have a history of a memory disorder that is developing, and he will be sent for blood work today. The patient does not require any further workup for the syncope events. He also has a peripheral neuropathy that may make him susceptible to syncope if he has an autonomic component to the neuropathy. He is on gabapentin for the neuropathic pain. He will followup through this office if needed, if the patient wants to go on a medication for memory, he will contact our office. Medications such as Aricept or Exelon probably should be avoided as they also may cause bradycardia.  Jill Alexanders MD 10/20/2013 12:39 PM  [facility] [address] [place], [place] [address]  Phone [phone] Fax [phone]

## 2013-10-22 LAB — TSH: TSH: 4.31 u[IU]/mL (ref 0.450–4.500)

## 2013-10-22 LAB — COPPER, SERUM: Copper: 120 ug/dL (ref 72–166)

## 2013-10-22 LAB — VITAMIN B12: VITAMIN B 12: 1768 pg/mL — AB (ref 211–946)

## 2013-10-22 NOTE — Progress Notes (Signed)
Quick Note:  I called and LMVM for pt at home and then also [name], daughter, regarding lab results ok. Are to call back if questions. ______

## 2013-11-01 ENCOUNTER — Other Ambulatory Visit (HOSPITAL_BASED_OUTPATIENT_CLINIC_OR_DEPARTMENT_OTHER): Payer: Medicare Other

## 2013-11-01 ENCOUNTER — Encounter: Payer: Self-pay | Admitting: *Deleted

## 2013-11-01 ENCOUNTER — Ambulatory Visit (HOSPITAL_BASED_OUTPATIENT_CLINIC_OR_DEPARTMENT_OTHER): Payer: Medicare Other

## 2013-11-01 ENCOUNTER — Telehealth: Payer: Self-pay | Admitting: Cardiovascular Disease

## 2013-11-01 VITALS — BP 119/57 | HR 67 | Temp 97.7°F | Resp 20

## 2013-11-01 DIAGNOSIS — Z87898 Personal history of other specified conditions: Secondary | ICD-10-CM

## 2013-11-01 DIAGNOSIS — E119 Type 2 diabetes mellitus without complications: Secondary | ICD-10-CM | POA: Diagnosis not present

## 2013-11-01 DIAGNOSIS — Z5112 Encounter for antineoplastic immunotherapy: Secondary | ICD-10-CM

## 2013-11-01 DIAGNOSIS — C8589 Other specified types of non-Hodgkin lymphoma, extranodal and solid organ sites: Secondary | ICD-10-CM | POA: Diagnosis not present

## 2013-11-01 DIAGNOSIS — C8580 Other specified types of non-Hodgkin lymphoma, unspecified site: Secondary | ICD-10-CM

## 2013-11-01 DIAGNOSIS — C8590 Non-Hodgkin lymphoma, unspecified, unspecified site: Secondary | ICD-10-CM

## 2013-11-01 DIAGNOSIS — Z8546 Personal history of malignant neoplasm of prostate: Secondary | ICD-10-CM | POA: Diagnosis not present

## 2013-11-01 LAB — CBC WITH DIFFERENTIAL/PLATELET
BASO%: 0.5 % (ref 0.0–2.0)
BASOS ABS: 0 10*3/uL (ref 0.0–0.1)
EOS%: 5.1 % (ref 0.0–7.0)
Eosinophils Absolute: 0.3 10*3/uL (ref 0.0–0.5)
HCT: 35.5 % — ABNORMAL LOW (ref 38.4–49.9)
HGB: 11.9 g/dL — ABNORMAL LOW (ref 13.0–17.1)
LYMPH%: 3.7 % — ABNORMAL LOW (ref 14.0–49.0)
MCH: 29.7 pg (ref 27.2–33.4)
MCHC: 33.6 g/dL (ref 32.0–36.0)
MCV: 88.3 fL (ref 79.3–98.0)
MONO#: 0.4 10*3/uL (ref 0.1–0.9)
MONO%: 7.7 % (ref 0.0–14.0)
NEUT#: 4.7 10*3/uL (ref 1.5–6.5)
NEUT%: 83 % — ABNORMAL HIGH (ref 39.0–75.0)
PLATELETS: 141 10*3/uL (ref 140–400)
RBC: 4.02 10*6/uL — AB (ref 4.20–5.82)
RDW: 15.7 % — AB (ref 11.0–14.6)
WBC: 5.7 10*3/uL (ref 4.0–10.3)
lymph#: 0.2 10*3/uL — ABNORMAL LOW (ref 0.9–3.3)

## 2013-11-01 LAB — COMPREHENSIVE METABOLIC PANEL (CC13)
ALT: 28 U/L (ref 0–55)
AST: 23 U/L (ref 5–34)
Albumin: 4.1 g/dL (ref 3.5–5.0)
Alkaline Phosphatase: 59 U/L (ref 40–150)
Anion Gap: 10 mEq/L (ref 3–11)
BUN: 18.5 mg/dL (ref 7.0–26.0)
CALCIUM: 10.2 mg/dL (ref 8.4–10.4)
CHLORIDE: 102 meq/L (ref 98–109)
CO2: 24 mEq/L (ref 22–29)
Creatinine: 1.5 mg/dL — ABNORMAL HIGH (ref 0.7–1.3)
Glucose: 223 mg/dl — ABNORMAL HIGH (ref 70–140)
POTASSIUM: 4.4 meq/L (ref 3.5–5.1)
Sodium: 137 mEq/L (ref 136–145)
Total Bilirubin: 0.49 mg/dL (ref 0.20–1.20)
Total Protein: 7 g/dL (ref 6.4–8.3)

## 2013-11-01 LAB — LACTATE DEHYDROGENASE (CC13): LDH: 142 U/L (ref 125–245)

## 2013-11-01 MED ORDER — DIPHENHYDRAMINE HCL 25 MG PO CAPS
ORAL_CAPSULE | ORAL | Status: AC
  Filled 2013-11-01: qty 1

## 2013-11-01 MED ORDER — HEPARIN SOD (PORK) LOCK FLUSH 100 UNIT/ML IV SOLN
500.0000 [IU] | Freq: Once | INTRAVENOUS | Status: AC | PRN
  Administered 2013-11-01: 500 [IU]
  Filled 2013-11-01: qty 5

## 2013-11-01 MED ORDER — ACETAMINOPHEN 325 MG PO TABS
ORAL_TABLET | ORAL | Status: AC
  Filled 2013-11-01: qty 2

## 2013-11-01 MED ORDER — ACETAMINOPHEN 325 MG PO TABS
650.0000 mg | ORAL_TABLET | Freq: Once | ORAL | Status: AC
  Administered 2013-11-01: 650 mg via ORAL

## 2013-11-01 MED ORDER — SODIUM CHLORIDE 0.9 % IV SOLN
375.0000 mg/m2 | Freq: Once | INTRAVENOUS | Status: AC
  Administered 2013-11-01: 800 mg via INTRAVENOUS
  Filled 2013-11-01: qty 80

## 2013-11-01 MED ORDER — SODIUM CHLORIDE 0.9 % IV SOLN
Freq: Once | INTRAVENOUS | Status: AC
  Administered 2013-11-01: 09:00:00 via INTRAVENOUS

## 2013-11-01 MED ORDER — SODIUM CHLORIDE 0.9 % IJ SOLN
10.0000 mL | INTRAMUSCULAR | Status: DC | PRN
  Administered 2013-11-01: 10 mL
  Filled 2013-11-01: qty 10

## 2013-11-01 MED ORDER — DIPHENHYDRAMINE HCL 25 MG PO CAPS
25.0000 mg | ORAL_CAPSULE | Freq: Once | ORAL | Status: AC
  Administered 2013-11-01: 25 mg via ORAL

## 2013-11-01 NOTE — Patient Instructions (Signed)
[facility] Discharge Instructions for Patients Receiving Chemotherapy  Today you received the following chemotherapy agents rituxan.     If you develop nausea and vomiting that is not controlled by your nausea medication, call the clinic.   BELOW ARE SYMPTOMS THAT SHOULD BE REPORTED IMMEDIATELY:  *FEVER GREATER THAN 100.5 F  *CHILLS WITH OR WITHOUT FEVER  NAUSEA AND VOMITING THAT IS NOT CONTROLLED WITH YOUR NAUSEA MEDICATION  *UNUSUAL [name]  *UNUSUAL BRUISING OR BLEEDING  TENDERNESS IN MOUTH AND THROAT WITH OR WITHOUT PRESENCE OF ULCERS  *URINARY PROBLEMS  *BOWEL PROBLEMS  UNUSUAL RASH Items with * indicate a potential emergency and should be followed up as soon as possible.  Feel free to call the clinic you have any questions or concerns. The clinic phone number is [phone].

## 2013-11-02 NOTE — Telephone Encounter (Signed)
Closed encounter °

## 2013-11-03 ENCOUNTER — Other Ambulatory Visit: Payer: Self-pay | Admitting: Internal Medicine

## 2013-11-04 LAB — BETA 2 MICROGLOBULIN, SERUM: Beta-2 Microglobulin: 4.75 mg/L — ABNORMAL HIGH (ref <=2.51)

## 2013-11-06 ENCOUNTER — Encounter: Payer: Self-pay | Admitting: Cardiovascular Disease

## 2013-11-07 ENCOUNTER — Encounter: Payer: Self-pay | Admitting: Cardiovascular Disease

## 2013-11-07 ENCOUNTER — Ambulatory Visit (INDEPENDENT_AMBULATORY_CARE_PROVIDER_SITE_OTHER): Payer: Medicare Other | Admitting: Cardiovascular Disease

## 2013-11-07 VITALS — BP 126/66 | Resp 16 | Ht 67.0 in | Wt 184.0 lb

## 2013-11-07 DIAGNOSIS — I495 Sick sinus syndrome: Secondary | ICD-10-CM

## 2013-11-07 DIAGNOSIS — I4891 Unspecified atrial fibrillation: Secondary | ICD-10-CM

## 2013-11-07 DIAGNOSIS — E119 Type 2 diabetes mellitus without complications: Secondary | ICD-10-CM | POA: Diagnosis not present

## 2013-11-07 DIAGNOSIS — I2510 Atherosclerotic heart disease of native coronary artery without angina pectoris: Secondary | ICD-10-CM

## 2013-11-07 DIAGNOSIS — I480 Paroxysmal atrial fibrillation: Secondary | ICD-10-CM

## 2013-11-07 DIAGNOSIS — E785 Hyperlipidemia, unspecified: Secondary | ICD-10-CM | POA: Diagnosis not present

## 2013-11-07 NOTE — Patient Instructions (Signed)
Continue current medication.  Dr. [name] recommends that you schedule a follow-up appointment in:   6 MONTHS.

## 2013-11-09 ENCOUNTER — Encounter: Payer: Self-pay | Admitting: Cardiovascular Disease

## 2013-11-09 DIAGNOSIS — I480 Paroxysmal atrial fibrillation: Secondary | ICD-10-CM | POA: Insufficient documentation

## 2013-11-09 DIAGNOSIS — I495 Sick sinus syndrome: Secondary | ICD-10-CM | POA: Insufficient documentation

## 2013-11-09 NOTE — Progress Notes (Signed)
Patient ID: [name], male   DOB: [birth year], 80 y.o.   MRN: [id number]      Reason for office visit History of syncope, paroxysmal fibrillation, CAD  This is [name] first visit to our office since his wife's death. She passed away this spring. He is accompanied by his daughter. He is almost 27 years old and has long-standing diabetes mellitus, hypertension, hyperlipidemia and coronary artery disease. He is undergoing maintenance rituximab treatment for non-Hodgkin's lymphoma and also has prostate cancer.  He has paroxysmal atrial fibrillation and is on chronic anticoagulation without any bleeding problems. A few months ago he was hospitalized with syncope and was found to have 5-6 second postconversion pauses, but these resolved once his beta blocker was stopped. He has not had any new syncopal or near syncopal events as the beta blocker was discontinued. He gets a little lightheadedness occasionally when he bends over his vegetable garden.  He has obstructive sleep apnea and uses CPAP. He has known moderate coronary artery disease with 50% lesion in his LAD and 80% lesion in his posterolateral ventricular artery and heavy coronary calcification. This is asymptomatic. He had a normal nuclear perfusion study in 2007. Has normal left ventricular systolic function, EF [id number] and a moderately dilated left atrium by echo in December 2014. He has insulin requiring type 2 diabetes mellitus and severe mixed hyperlipidemia on statin/fenofibrate/Zetia therapy.  Allergies  Allergen Reactions  . Niacin Other (See Comments)    headaches    Current Outpatient Prescriptions  Medication Sig Dispense Refill  . allopurinol (ZYLOPRIM) 300 MG tablet TAKE 1 TABLET (300 MG TOTAL) BY MOUTH DAILY.  30 tablet  3  . amLODipine (NORVASC) 5 MG tablet Take 2.5 mg by mouth daily.       Marland Kitchen atorvastatin (LIPITOR) 20 MG tablet Take 20 mg by mouth at bedtime.      . cetirizine (ZYRTEC) 10 MG tablet Take 10 mg  by mouth daily.      . cholecalciferol (VITAMIN D) 1000 UNITS tablet Take 1,000 Units by mouth daily.      . Cyanocobalamin (VITAMIN B 12 PO) Take 2,500 mg by mouth daily.       Marland Kitchen ezetimibe (ZETIA) 10 MG tablet Take 10 mg by mouth at bedtime.      . fenofibrate 160 MG tablet Take 160 mg by mouth at bedtime.      . gabapentin (NEURONTIN) 300 MG capsule Take 300 mg by mouth 3 (three) times daily.       . insulin NPH Human (HUMULIN N,NOVOLIN N) 100 UNIT/ML injection Inject 40 Units into the skin 2 (two) times daily before a meal. 35 units in the morning and 40 units at bedtime.      . magnesium oxide (MAG-OX) 400 MG tablet Take 400 mg by mouth 2 (two) times daily.      . metFORMIN (GLUCOPHAGE) 1000 MG tablet Take 1,000 mg by mouth 2 (two) times daily with a meal.      . Multiple Vitamins-Minerals (MULTIVITAMIN WITH MINERALS) tablet Take 1 tablet by mouth daily.      . sertraline (ZOLOFT) 100 MG tablet Take 100 mg by mouth daily.      . vitamin C (ASCORBIC ACID) 500 MG tablet Take 500 mg by mouth daily.      Alveda Reasons 20 MG TABS tablet Take 20 mg by mouth daily with supper.        No current facility-administered medications for this visit.  Past Medical History  Diagnosis Date  . Hypertension   . Cancer of liver   . Kidney stone   . Prostate cancer   . Skin cancer   . Arthritis   . Anemia   . GERD (gastroesophageal reflux disease)   . Lymphoma   . [name]   . Sleep apnea   . Neuropathy in diabetes     Hx: of  . Diabetes mellitus     INSULIN DEPENDENT  . HX, PERSONAL, MALIGNANCY, PROSTATE 07/28/2006    Annotation: 2001, resected Qualifier: Diagnosis of  By: [name], [name]    . A-fib   . Near syncope 10/18/2013  . Memory deficit 10/18/2013  . HOH (hard of hearing)     Past Surgical History  Procedure Laterality Date  . Prostatectomy    . Kidney stone surgery    . Rotator cuff repair    . Ankle surgery    . Cardiac catheterization  09/16/96    Normal LV  systolic function,dense ca+ prox. portion of the LAD w/50% narrowing in the distal portion, 30-40% irreg. in the proximal portion & 80% narrowing in the ostial portion of the posterolateral branch.  . Colonoscopy      Hx: of  . Infusion port  04/04/2013    RIGHT SUBCLAVIAN  . Cholecystectomy  04/04/2013  . Cholecystectomy N/A 04/04/2013    Procedure: LAPAROSCOPIC CHOLECYSTECTOMY WITH INTRAOPERATIVE CHOLANGIOGRAM;  [name], MD;  Location: [place];  Service: General;  [name];  . Portacath placement N/A 04/04/2013    Procedure: INSERTION PORT-A-CATH;  [name], MD;  Location: [facility] OR;  Service: General;  [name];    Family History  Problem Relation Age of Onset  . Heart disease Father   . Heart attack Father   . Cancer Sister   . Heart attack Brother   . Heart attack Brother   . Heart attack Brother   . Heart attack Brother   . Heart attack Brother   . Cancer Brother   . Cancer Brother     History   Social History  . Marital Status: Widowed    Spouse Name: N/A    Number of Children: 3  . Years of Education: 9th   Occupational History  . Retired    Social History Main Topics  . Smoking status: Former Smoker    Types: Pipe  . Smokeless tobacco: Former Systems developer    Types: Chew     Comment: [place] YEARS AGO "  . Alcohol Use: No  . Drug Use: No  . Sexual Activity: No   Other Topics Concern  . Not on file   Social History Narrative  . No narrative on file    Review of systems: The patient specifically denies any chest pain at rest or with exertion, dyspnea at rest or with exertion, orthopnea, paroxysmal nocturnal dyspnea, syncope, palpitations, focal neurological deficits, intermittent claudication, lower extremity edema, unexplained weight gain, cough, hemoptysis or wheezing.  The patient also denies abdominal pain, nausea, vomiting, dysphagia, diarrhea, constipation, polyuria, polydipsia, dysuria, hematuria, frequency,  urgency, abnormal bleeding or bruising, fever, chills, unexpected weight changes, mood swings, change in skin or hair texture, change in voice quality, auditory or visual problems, allergic reactions or rashes, new musculoskeletal complaints other than usual "aches and pains".   PHYSICAL EXAM BP 126/66  Resp 16  Ht 5\' 7"  (1.702 m)  Wt 184 lb (83.462 kg)  BMI 28.81 kg/m2  General:  Alert, oriented x3, no distress Head: no evidence of trauma, PERRL, EOMI, no exophtalmos or lid lag, no myxedema, no xanthelasma; normal ears, nose and oropharynx Neck: normal jugular venous pulsations and no hepatojugular reflux; brisk carotid pulses without delay and no carotid bruits Chest: clear to auscultation, no signs of consolidation by percussion or palpation, normal fremitus, symmetrical and full respiratory excursions Cardiovascular: normal position and quality of the apical impulse, regular rhythm, normal first and second heart sounds, no murmurs, rubs or gallops Abdomen: no tenderness or distention, no masses by palpation, no abnormal pulsatility or arterial bruits, normal bowel sounds, no hepatosplenomegaly Extremities: no clubbing, cyanosis or edema; 2+ radial, ulnar and brachial pulses bilaterally; 2+ right femoral, posterior tibial and dorsalis pedis pulses; 2+ left femoral, posterior tibial and dorsalis pedis pulses; no subclavian or femoral bruits Neurological: grossly nonfocal  Lipid Panel  No results found for this basename: chol, trig, hdl, cholhdl, vldl, ldlcalc    BMET    Component Value Date/Time   NA 137 11/01/2013 0813   NA 137 08/19/2013 0825   K 4.4 11/01/2013 0813   K 3.7 08/19/2013 0825   CL 100 08/19/2013 0825   CO2 24 11/01/2013 0813   CO2 24 08/19/2013 0825   GLUCOSE 223* 11/01/2013 0813   GLUCOSE 207* 08/19/2013 0825   BUN 18.5 11/01/2013 0813   BUN 13 08/19/2013 0825   CREATININE 1.5* 11/01/2013 0813   CREATININE 0.92 08/19/2013 0825   CALCIUM 10.2 11/01/2013 0813   CALCIUM 9.6  08/19/2013 0825   CALCIUM 11.0* 08/17/2005 0842   GFRNONAA 78* 08/19/2013 0825   GFRAA >90 08/19/2013 0825     ASSESSMENT AND PLAN  Mr. Huntoon has clear evidence of sinus node dysfunction and tachycardia-bradycardia syndrome(sinus pauses following conversion of paroxysmal atrial fibrillation), but is asymptomatic after discontinuation of his beta blocker. The pacemaker is not indicated at this time but may become necessary in the future.  He has diabetes mellitus and mixed hyperlipidemia on complex treatment with 3 lipid-lowering agents. His blood pressure is well controlled on amlodipine. Beta blockers and centrally acting calcium channel blockers should be avoided.  He has known coronary artery disease but this is asymptomatic and was moderate by most recent assessment. There is no indication for revascularization procedures.  He is almost 80 years old and makes it clear that he is most interested in quality of life rather than length of life. Longevity is possibly related more to his oncological problems, rather than his vascular ones.  Patient Instructions  Continue current medication.  Dr. [name] recommends that you schedule a follow-up appointment in:   6 MONTHS.      Holli Humbles, MD, [place] [phone] office [phone] pager

## 2013-11-16 ENCOUNTER — Telehealth: Payer: Self-pay | Admitting: Oncology

## 2013-11-16 NOTE — Telephone Encounter (Signed)
r/s 9/18 appt to 9/17 due to GM out. lmonvm for pt and mailed scheduled.

## 2013-11-29 ENCOUNTER — Other Ambulatory Visit: Payer: Self-pay | Admitting: Dermatology

## 2013-11-29 DIAGNOSIS — L820 Inflamed seborrheic keratosis: Secondary | ICD-10-CM | POA: Diagnosis not present

## 2013-11-29 DIAGNOSIS — Z85828 Personal history of other malignant neoplasm of skin: Secondary | ICD-10-CM | POA: Diagnosis not present

## 2013-11-29 DIAGNOSIS — D692 Other nonthrombocytopenic purpura: Secondary | ICD-10-CM | POA: Diagnosis not present

## 2013-11-29 DIAGNOSIS — L570 Actinic keratosis: Secondary | ICD-10-CM | POA: Diagnosis not present

## 2013-11-29 DIAGNOSIS — C44621 Squamous cell carcinoma of skin of unspecified upper limb, including shoulder: Secondary | ICD-10-CM | POA: Diagnosis not present

## 2013-11-29 DIAGNOSIS — L821 Other seborrheic keratosis: Secondary | ICD-10-CM | POA: Diagnosis not present

## 2013-11-29 DIAGNOSIS — D1801 Hemangioma of skin and subcutaneous tissue: Secondary | ICD-10-CM | POA: Diagnosis not present

## 2013-11-29 DIAGNOSIS — D485 Neoplasm of uncertain behavior of skin: Secondary | ICD-10-CM | POA: Diagnosis not present

## 2013-11-29 DIAGNOSIS — L819 Disorder of pigmentation, unspecified: Secondary | ICD-10-CM | POA: Diagnosis not present

## 2013-12-20 DIAGNOSIS — L570 Actinic keratosis: Secondary | ICD-10-CM | POA: Diagnosis not present

## 2013-12-31 ENCOUNTER — Ambulatory Visit: Payer: Medicare Other

## 2014-01-09 ENCOUNTER — Telehealth: Payer: Self-pay | Admitting: *Deleted

## 2014-01-09 ENCOUNTER — Telehealth: Payer: Self-pay | Admitting: Oncology

## 2014-01-09 ENCOUNTER — Ambulatory Visit (HOSPITAL_BASED_OUTPATIENT_CLINIC_OR_DEPARTMENT_OTHER): Payer: Medicare Other

## 2014-01-09 ENCOUNTER — Ambulatory Visit (HOSPITAL_BASED_OUTPATIENT_CLINIC_OR_DEPARTMENT_OTHER): Payer: BLUE CROSS/BLUE SHIELD | Admitting: Oncology

## 2014-01-09 ENCOUNTER — Other Ambulatory Visit (HOSPITAL_BASED_OUTPATIENT_CLINIC_OR_DEPARTMENT_OTHER): Payer: Medicare Other

## 2014-01-09 VITALS — BP 136/63 | HR 62 | Temp 98.1°F | Resp 20 | Ht 67.0 in | Wt 187.9 lb

## 2014-01-09 VITALS — BP 118/78 | HR 59 | Temp 98.8°F | Resp 12

## 2014-01-09 DIAGNOSIS — C8590 Non-Hodgkin lymphoma, unspecified, unspecified site: Secondary | ICD-10-CM

## 2014-01-09 DIAGNOSIS — Z5112 Encounter for antineoplastic immunotherapy: Secondary | ICD-10-CM

## 2014-01-09 DIAGNOSIS — C8589 Other specified types of non-Hodgkin lymphoma, extranodal and solid organ sites: Secondary | ICD-10-CM | POA: Diagnosis not present

## 2014-01-09 DIAGNOSIS — C8588 Other specified types of non-Hodgkin lymphoma, lymph nodes of multiple sites: Secondary | ICD-10-CM

## 2014-01-09 DIAGNOSIS — Z87898 Personal history of other specified conditions: Secondary | ICD-10-CM

## 2014-01-09 DIAGNOSIS — E119 Type 2 diabetes mellitus without complications: Secondary | ICD-10-CM | POA: Diagnosis not present

## 2014-01-09 DIAGNOSIS — C8299 Follicular lymphoma, unspecified, extranodal and solid organ sites: Secondary | ICD-10-CM

## 2014-01-09 DIAGNOSIS — C8580 Other specified types of non-Hodgkin lymphoma, unspecified site: Secondary | ICD-10-CM

## 2014-01-09 DIAGNOSIS — Z23 Encounter for immunization: Secondary | ICD-10-CM | POA: Diagnosis not present

## 2014-01-09 DIAGNOSIS — R197 Diarrhea, unspecified: Secondary | ICD-10-CM | POA: Diagnosis not present

## 2014-01-09 DIAGNOSIS — Z8546 Personal history of malignant neoplasm of prostate: Secondary | ICD-10-CM | POA: Diagnosis not present

## 2014-01-09 LAB — COMPREHENSIVE METABOLIC PANEL (CC13)
ALBUMIN: 4 g/dL (ref 3.5–5.0)
ALT: 25 U/L (ref 0–55)
AST: 24 U/L (ref 5–34)
Alkaline Phosphatase: 71 U/L (ref 40–150)
Anion Gap: 12 mEq/L — ABNORMAL HIGH (ref 3–11)
BILIRUBIN TOTAL: 0.36 mg/dL (ref 0.20–1.20)
BUN: 21.6 mg/dL (ref 7.0–26.0)
CO2: 21 mEq/L — ABNORMAL LOW (ref 22–29)
Calcium: 9.6 mg/dL (ref 8.4–10.4)
Chloride: 102 mEq/L (ref 98–109)
Creatinine: 1.2 mg/dL (ref 0.7–1.3)
GLUCOSE: 238 mg/dL — AB (ref 70–140)
POTASSIUM: 3.8 meq/L (ref 3.5–5.1)
SODIUM: 135 meq/L — AB (ref 136–145)
TOTAL PROTEIN: 7 g/dL (ref 6.4–8.3)

## 2014-01-09 LAB — CBC WITH DIFFERENTIAL/PLATELET
BASO%: 0.6 % (ref 0.0–2.0)
Basophils Absolute: 0 10*3/uL (ref 0.0–0.1)
EOS%: 6.4 % (ref 0.0–7.0)
Eosinophils Absolute: 0.4 10*3/uL (ref 0.0–0.5)
HEMATOCRIT: 34.7 % — AB (ref 38.4–49.9)
HGB: 11.5 g/dL — ABNORMAL LOW (ref 13.0–17.1)
LYMPH%: 3.8 % — AB (ref 14.0–49.0)
MCH: 29 pg (ref 27.2–33.4)
MCHC: 33.1 g/dL (ref 32.0–36.0)
MCV: 87.7 fL (ref 79.3–98.0)
MONO#: 0.5 10*3/uL (ref 0.1–0.9)
MONO%: 8.3 % (ref 0.0–14.0)
NEUT#: 4.9 10*3/uL (ref 1.5–6.5)
NEUT%: 80.9 % — AB (ref 39.0–75.0)
PLATELETS: 138 10*3/uL — AB (ref 140–400)
RBC: 3.95 10*6/uL — AB (ref 4.20–5.82)
RDW: 15.6 % — ABNORMAL HIGH (ref 11.0–14.6)
WBC: 6 10*3/uL (ref 4.0–10.3)
lymph#: 0.2 10*3/uL — ABNORMAL LOW (ref 0.9–3.3)

## 2014-01-09 LAB — LACTATE DEHYDROGENASE (CC13): LDH: 149 U/L (ref 125–245)

## 2014-01-09 MED ORDER — SODIUM CHLORIDE 0.9 % IV SOLN
375.0000 mg/m2 | Freq: Once | INTRAVENOUS | Status: AC
  Administered 2014-01-09: 800 mg via INTRAVENOUS
  Filled 2014-01-09: qty 80

## 2014-01-09 MED ORDER — SODIUM CHLORIDE 0.9 % IJ SOLN
10.0000 mL | INTRAMUSCULAR | Status: DC | PRN
  Administered 2014-01-09: 10 mL
  Filled 2014-01-09: qty 10

## 2014-01-09 MED ORDER — ACETAMINOPHEN 325 MG PO TABS
650.0000 mg | ORAL_TABLET | Freq: Once | ORAL | Status: AC
  Administered 2014-01-09: 650 mg via ORAL

## 2014-01-09 MED ORDER — DIPHENHYDRAMINE HCL 25 MG PO CAPS
25.0000 mg | ORAL_CAPSULE | Freq: Once | ORAL | Status: AC
  Administered 2014-01-09: 25 mg via ORAL

## 2014-01-09 MED ORDER — DIPHENHYDRAMINE HCL 25 MG PO CAPS
ORAL_CAPSULE | ORAL | Status: AC
  Filled 2014-01-09: qty 1

## 2014-01-09 MED ORDER — SODIUM CHLORIDE 0.9 % IV SOLN
Freq: Once | INTRAVENOUS | Status: AC
  Administered 2014-01-09: 10:00:00 via INTRAVENOUS

## 2014-01-09 MED ORDER — INFLUENZA VAC SPLIT QUAD 0.5 ML IM SUSY
0.5000 mL | PREFILLED_SYRINGE | Freq: Once | INTRAMUSCULAR | Status: AC
  Administered 2014-01-09: 0.5 mL via INTRAMUSCULAR
  Filled 2014-01-09: qty 0.5

## 2014-01-09 MED ORDER — HEPARIN SOD (PORK) LOCK FLUSH 100 UNIT/ML IV SOLN
500.0000 [IU] | Freq: Once | INTRAVENOUS | Status: AC | PRN
  Administered 2014-01-09: 500 [IU]
  Filled 2014-01-09: qty 5

## 2014-01-09 MED ORDER — ACETAMINOPHEN 325 MG PO TABS
ORAL_TABLET | ORAL | Status: AC
  Filled 2014-01-09: qty 2

## 2014-01-09 NOTE — Telephone Encounter (Signed)
, °

## 2014-01-09 NOTE — Telephone Encounter (Signed)
Per staff message and POF I have scheduled appts. Advised scheduler of appts. JMW  

## 2014-01-09 NOTE — Progress Notes (Signed)
ID: [name] OB: [birth year]  MR#: [id number]  [name]#:[id number]  PCP: Michael Pel, MD GYN:   SU: [name] OTHER MD: [name], [name],  [name], [name]  CHIEF COMPLAINT: Non-Hodgkin's lymphoma  CURRENT TREATMENT: Maintenance rituximab    NON-HODGKIN"S LYMPHOMA HISTORY: From the prior summary:  The patient developed right upper quadrant pain last year and he had an ultrasound April 5th which showed some gallstones without evidence of cholecystitis or ductal dilatation.  However, there were multiple lesions in the liver which could not be assessed further.  Accordingly on July 31, 2003, [name], showed multiple liver masses, more than 25, most over 1 cm, the largest being in the inferior right lobe, measuring 4.2 cm. There was also a small mass in the spleen.  CT of the pelvis was unremarkable.   The patient had a biopsy of the liver August 01, 2003.  The report says only that it was "a lesion in the right lobe of the liver".  Presumably this was the largest lesion present.  The final pathology [id number] and [phone]) showed only cirrhosis.     The patient has been followed by [name], and a repeat CT scan of the abdomen and pelvis was obtained May 18, 2004.  Many of the liver lesions seen previously had actually decreased in size.  However, [name], [name], a second ultrasound-guided biopsy was performed 06/11/04. This was a lesion deep in the left lobe of the liver and therefore, I would think not the same one previously biopsied which was in the right liver. The pathology this time  [id number]) shows a poorly differentiated neuroendocrine carcinoma which was positive for chromogranin A, negative for synaptophysin, thyroid transcription factor, a variety of cytokeratins, [name], alpha-fetoprotein and COX-2."  [name] was subsequently evaluated at [name] by Dr. Otelia [name] and repeat liver biopsy and review of the earlier biopsy here showed a primary hepatic lymphoma. The patient was treated with R-CHOP as detailed below and achieved a complete response. He received maintenance rituximab until 2008  His subsequent history is as detailed below  INTERVAL HISTORY. AC returns today with his 2 daughters for followup of his breast cancer. The interval history is unremarkable. He continues to work in his garden and through spoil his dog. His cardiac medicines have been adjusted and he has had no further syncopal episodes. He has had a couple more basal cells removed or "burned off" by Dr. Ubaldo [name].  REVIEW OF SYSTEMS:  The biggest problem he is having his diarrhea. He will have her for bowel movements a day, "just above liquid". This can happen for several days and then improved but then it happens again. It doesn't seem to be associated with activity or diet as far as he can tell. He has had no fevers, drenching sweats, unexplained fatigue or weight loss. He is not aware of any adenopathy. He is tolerating his anticoagulants with no overt bleeding. Sometimes his ankles swell. His blood sugar is not well controlled but "and trying". A detailed review of systems today was otherwise stable.  PAST  MEDICAL HISTORY: Past Medical History  Diagnosis Date  . Hypertension   . Cancer of liver   . Kidney stone   . Prostate cancer   . Skin cancer   . Arthritis   . Anemia   . GERD (gastroesophageal reflux disease)   . Lymphoma   . [name]   . Sleep apnea   . Neuropathy in diabetes     Hx: of  . Diabetes mellitus     INSULIN DEPENDENT  . HX, PERSONAL, MALIGNANCY, PROSTATE  07/28/2006    Annotation: 2001, resected Qualifier: Diagnosis of  By: Michael Sima MD, Dellis [name]    . A-fib   . Near syncope 10/18/2013  . Memory deficit 10/18/2013  . HOH (hard of hearing)   Significant for prostate cancer, the patient undergoing prostatectomy January 2001 under [name] for a Gleason 7, [name], minor coronary artery disease, hypertension, hypertriglyceridemia, cholelithiasis, colon polyps, cirrhosis by biopsy, diabetes, squamous cell carcinomas of the skin removed by [name], history of nephrolithiasis, status post right renal surgery and history of left rotator cuff repair under [name].    PAST SURGICAL HISTORY: Past Surgical History  Procedure Laterality Date  . Prostatectomy    . Kidney stone surgery    . Rotator cuff repair    . Ankle surgery    . Cardiac catheterization  09/16/96    Normal LV systolic function,dense ca+ prox. portion of the LAD w/50% narrowing in the distal portion, 30-40% irreg. in the proximal portion & 80% narrowing in the ostial portion of the posterolateral branch.  . Colonoscopy      Hx: of  . Infusion port  04/04/2013    RIGHT SUBCLAVIAN  . Cholecystectomy  04/04/2013  . Cholecystectomy N/A 04/04/2013    Procedure: LAPAROSCOPIC CHOLECYSTECTOMY WITH INTRAOPERATIVE CHOLANGIOGRAM;  Surgeon: Michael Regal, MD;  Location: [name];  Service: General;  [name];  . Portacath placement N/A 04/04/2013    Procedure: INSERTION PORT-A-CATH;  Surgeon: Michael Regal, MD;  Location: [place];  Service: General;  [name];    FAMILY HISTORY Family History  Problem Relation Age of Onset  . Heart disease Father   . Heart attack Father   . Cancer Sister   . Heart attack Brother   . Heart attack Brother   . Heart attack Brother   . Heart attack Brother   . Heart attack  Brother   . Cancer Brother   . Cancer Brother   The patient's father died at the age of 68 from an MI.  The patient's mother died from "old age" at 47.  The patient is one of nine siblings.  One brother died from cancer of the esophagus, 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, [name], a homemaker, is present today as are the patient's three daughters: [name] is an Scientist, physiological for [employer] [name] [employer]; [name], [name]; and [name] is Environmental consultant.  Everybody lives in [name].  The patient has five grandchildren.  He is a member of [place]. [place].  ADVANCED DIRECTIVES: in place   HEALTH MAINTENANCE: History  Substance Use Topics  . Smoking status: Former Smoker    Types: Pipe  . Smokeless tobacco: Former Systems developer    Types: Chew     Comment: [place] YEARS AGO "  . Alcohol Use: No     Colonoscopy:  PSA: 8.01 (NOV 2014)  Bone density:  Lipid panel:  Allergies  Allergen Reactions  . Niacin Other (See Comments)    headaches    Current Outpatient Prescriptions  Medication Sig Dispense Refill  . allopurinol (ZYLOPRIM) 300 MG tablet TAKE 1 TABLET (300 MG TOTAL) BY MOUTH DAILY.  30 tablet  3  . amLODipine (NORVASC) 5 MG tablet Take 2.5 mg by mouth daily.       Marland Kitchen atorvastatin (LIPITOR) 20 MG tablet Take 20 mg by mouth at bedtime.      . cetirizine (ZYRTEC) 10 MG tablet Take 10 mg by mouth daily.      . cholecalciferol (VITAMIN D) 1000 UNITS tablet Take 1,000 Units by mouth daily.      . Cyanocobalamin (VITAMIN B 12 PO) Take 2,500 mg by mouth daily.       Marland Kitchen ezetimibe (ZETIA) 10 MG tablet Take 10 mg by mouth at bedtime.      . fenofibrate  160 MG tablet Take 160 mg by mouth at bedtime.      . gabapentin (NEURONTIN) 300 MG capsule Take 300 mg by mouth 3 (three) times daily.       . insulin NPH Human (HUMULIN N,NOVOLIN N) 100 UNIT/ML injection Inject 40 Units into the skin 2 (two) times daily before a meal. 35 units in the morning and 40 units at bedtime.      . magnesium oxide (MAG-OX) 400 MG tablet Take 400 mg by mouth 2 (two) times daily.      . metFORMIN (GLUCOPHAGE) 1000 MG tablet Take 1,000 mg by mouth 2 (two) times daily with a meal.      . Multiple Vitamins-Minerals (MULTIVITAMIN WITH MINERALS) tablet Take 1 tablet by mouth daily.      . sertraline (ZOLOFT) 100 MG tablet Take 100 mg by mouth daily.      . vitamin C (ASCORBIC ACID) 500 MG tablet Take 500 mg by mouth daily.      Alveda Reasons 20 MG TABS tablet Take 20 mg by mouth daily with supper.        No current facility-administered medications for this visit.    Objective: Middle-aged white man in no acute distress  Filed Vitals:   01/09/14 0820  BP: 136/63  Pulse: 62  Temp: 98.1 F (36.7 C)  Resp: 20     Body mass index is 29.42 kg/(m^2).      ECOG FS:0 - Asymptomatic  HEENT PERRLA, sclerae anicteric, pupils round and equal Ear-nose-throat: No evidence of rhinophyma; oropharynx clear Lymphatic: No cervical or supraclavicular adenopathy; no axillary adenopathy; no inguinal adenopathy Lungs no rales or rhonchi, good excursion bilaterally Heart regular rate and rhythm today, no murmur appreciated Abdomen:  soft, nontender, positive bowel sounds, no masses palpated MSK no focal spinal tenderness Neuro: non-focal, well-oriented, positive affect  LAB RESULTS:  CMP     Component Value Date/Time   NA 135* 01/09/2014 0809   NA 137 08/19/2013 0825   K 3.8 01/09/2014 0809   K 3.7 08/19/2013 0825   CL 100 08/19/2013 0825   CO2 21* 01/09/2014 0809   CO2 24 08/19/2013 0825   GLUCOSE 238*  01/09/2014 0809   GLUCOSE 207* 08/19/2013 0825   BUN 21.6 01/09/2014 0809   BUN 13  08/19/2013 0825   CREATININE 1.2 01/09/2014 0809   CREATININE 0.92 08/19/2013 0825   CALCIUM 9.6 01/09/2014 0809   CALCIUM 9.6 08/19/2013 0825   CALCIUM 11.0* 08/17/2005 0842   PROT 7.0 01/09/2014 0809   PROT 5.9* 08/16/2013 1253   ALBUMIN 4.0 01/09/2014 0809   ALBUMIN 3.5 08/16/2013 1253   AST 24 01/09/2014 0809   AST 24 08/16/2013 1253   ALT 25 01/09/2014 0809   ALT 21 08/16/2013 1253   ALKPHOS 71 01/09/2014 0809   ALKPHOS 54 08/16/2013 1253   BILITOT 0.36 01/09/2014 0809   BILITOT 0.3 08/16/2013 1253   GFRNONAA 78* 08/19/2013 0825   GFRAA >90 08/19/2013 0825    No results found for this basename: SPEP,  UPEP,   kappa and lambda light chains    Lab Results  Component Value Date   WBC 6.0 01/09/2014   NEUTROABS 4.9 01/09/2014   HGB 11.5* 01/09/2014   HCT 34.7* 01/09/2014   MCV 87.7 01/09/2014   PLT 138* 01/09/2014      Chemistry      Component Value Date/Time   NA 135* 01/09/2014 0809   NA 137 08/19/2013 0825   K 3.8 01/09/2014 0809   K 3.7 08/19/2013 0825   CL 100 08/19/2013 0825   CO2 21* 01/09/2014 0809   CO2 24 08/19/2013 0825   BUN 21.6 01/09/2014 0809   BUN 13 08/19/2013 0825   CREATININE 1.2 01/09/2014 0809   CREATININE 0.92 08/19/2013 0825      Component Value Date/Time   CALCIUM 9.6 01/09/2014 0809   CALCIUM 9.6 08/19/2013 0825   CALCIUM 11.0* 08/17/2005 0842   ALKPHOS 71 01/09/2014 0809   ALKPHOS 54 08/16/2013 1253   AST 24 01/09/2014 0809   AST 24 08/16/2013 1253   ALT 25 01/09/2014 0809   ALT 21 08/16/2013 1253   BILITOT 0.36 01/09/2014 0809   BILITOT 0.3 08/16/2013 1253      Urinalysis    Component Value Date/Time   COLORURINE YELLOW 08/16/2013 1317   APPEARANCEUR CLEAR 08/16/2013 1317   LABSPEC 1.009 08/16/2013 1317   PHURINE 6.5 08/16/2013 1317   GLUCOSEU NEGATIVE 08/16/2013 1317   HGBUR NEGATIVE 08/16/2013 [place] 08/16/2013 1317   KETONESUR NEGATIVE 08/16/2013 1317   PROTEINUR NEGATIVE 08/16/2013 1317   UROBILINOGEN 0.2 08/16/2013 1317   NITRITE NEGATIVE  08/16/2013 1317   LEUKOCYTESUR NEGATIVE 08/16/2013 1317    STUDIES: No results found.  ASSESSMENT:  80 y.o. patient with a diagnosis of    #1 Primary hepatic non-Hodgkin's lymphoma established through liver biopsy February 2006, treated with Rituxan x9 and then CHOP x6.  All chemotherapy completed in 2007.  Maintenance Rituxan discontinued October 2008.     #2  Biopsy proven retroperitoneal recurrence documented November 2014. Bone marrow biopsy 04/01/2013 was negative   #3 status post  laparoscopic cholecystectomy and port placement on 04/04/2013.  #4 Cycle #1  RICE chemotherapy  completed on 05/02/2013.   #5 cycle #2 RICEchemotherapy  on 05/17/2013  #6 PET scan performed on 05/23/2013 revealed marked partial response to chemotherapy with a retroperitoneal mass decrease in metabolic activity from SUV of 13.2 to 6.3. Right adrenal mass size and metabolic activity is resolved when compared to the previous PET scan.   #7 Evaluated at [name] by Dr. Tomasa Hosteller for autologous transplant on 05/27/2013 and was quoted a 3% chance of significant  heart damage and possibly death from the treatment and a 50% chance of being alive 5 years from now after transplant (versus perhaps 2 years without). After much thought the patient and family have decided firmly they do not wish to proceed to stem cell transplant.  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 treated with vancomycin IV x14 days completed 07/02/2013, followed by doxycycline for 2 additional weeks  #10 started maintenance Rituxan May 2015 (1 dose every 8 weeks)  PLAN: AC shows no symptoms or signs of active lymphoma. We are going to continue with maintenance rituximab so long as there is no evidence of disease progression. Next treatment will be November 13. I am going to obtain a restaging PET scan at the end of this year and then she will see me again January 8 with this  next rituximab does.  I suspect the diarrhea problem he is experiencing is related to diet or medication, but we are going to send a C. difficile study just in case.  AC has a good understanding of the overall plan. He agrees with it. He knows the goal of treatment in his case is control. He will call with any problems that may develop before his next visit here.  [name], MD   01/09/2014

## 2014-01-09 NOTE — Patient Instructions (Addendum)
[facility] Discharge Instructions for Patients Receiving Chemotherapy  Today you received the following chemotherapy agents: Rituxan. You also received your flu shot today.  To help prevent nausea and vomiting after your treatment, we encourage you to take your nausea medication as prescribed by your physician.    If you develop nausea and vomiting that is not controlled by your nausea medication, call the clinic.   BELOW ARE SYMPTOMS THAT SHOULD BE REPORTED IMMEDIATELY:  *FEVER GREATER THAN 100.5 F  *CHILLS WITH OR WITHOUT FEVER  NAUSEA AND VOMITING THAT IS NOT CONTROLLED WITH YOUR NAUSEA MEDICATION  *UNUSUAL [name]  *UNUSUAL BRUISING OR BLEEDING  TENDERNESS IN MOUTH AND THROAT WITH OR WITHOUT PRESENCE OF ULCERS  *URINARY PROBLEMS  *BOWEL PROBLEMS  UNUSUAL RASH Items with * indicate a potential emergency and should be followed up as soon as possible.  Feel free to call the clinic you have any questions or concerns. The clinic phone number is [phone].

## 2014-01-10 ENCOUNTER — Other Ambulatory Visit: Payer: Medicare Other

## 2014-01-10 ENCOUNTER — Ambulatory Visit: Payer: Medicare Other

## 2014-01-10 ENCOUNTER — Ambulatory Visit: Payer: Medicare Other | Admitting: Oncology

## 2014-01-10 ENCOUNTER — Ambulatory Visit: Payer: BLUE CROSS/BLUE SHIELD

## 2014-01-10 DIAGNOSIS — R197 Diarrhea, unspecified: Secondary | ICD-10-CM | POA: Diagnosis not present

## 2014-01-10 LAB — CLOSTRIDIUM DIFFICILE BY PCR: Toxigenic C. Difficile by PCR: NEGATIVE

## 2014-01-10 NOTE — Addendum Note (Signed)
Addended by: Jaci Carrel A on: 01/10/2014 10:17 AM   Modules accepted: Orders

## 2014-01-13 ENCOUNTER — Other Ambulatory Visit: Payer: Self-pay | Admitting: *Deleted

## 2014-01-13 DIAGNOSIS — C8299 Follicular lymphoma, unspecified, extranodal and solid organ sites: Secondary | ICD-10-CM

## 2014-01-13 LAB — BETA 2 MICROGLOBULIN, SERUM: BETA 2 MICROGLOBULIN: 4.14 mg/L — AB (ref <=2.51)

## 2014-01-13 MED ORDER — ALLOPURINOL 300 MG PO TABS: ORAL_TABLET | ORAL | Status: DC

## 2014-01-21 DIAGNOSIS — E119 Type 2 diabetes mellitus without complications: Secondary | ICD-10-CM | POA: Diagnosis not present

## 2014-01-21 DIAGNOSIS — E7800 Pure hypercholesterolemia, unspecified: Secondary | ICD-10-CM | POA: Diagnosis not present

## 2014-01-24 DIAGNOSIS — L570 Actinic keratosis: Secondary | ICD-10-CM | POA: Diagnosis not present

## 2014-01-27 DIAGNOSIS — I4891 Unspecified atrial fibrillation: Secondary | ICD-10-CM | POA: Diagnosis not present

## 2014-01-27 DIAGNOSIS — E119 Type 2 diabetes mellitus without complications: Secondary | ICD-10-CM | POA: Diagnosis not present

## 2014-01-27 DIAGNOSIS — E785 Hyperlipidemia, unspecified: Secondary | ICD-10-CM | POA: Diagnosis not present

## 2014-01-31 DIAGNOSIS — C8590 Non-Hodgkin lymphoma, unspecified, unspecified site: Secondary | ICD-10-CM | POA: Diagnosis not present

## 2014-01-31 DIAGNOSIS — C61 Malignant neoplasm of prostate: Secondary | ICD-10-CM | POA: Diagnosis not present

## 2014-02-03 ENCOUNTER — Other Ambulatory Visit: Payer: Self-pay | Admitting: *Deleted

## 2014-02-04 NOTE — Telephone Encounter (Signed)
NO ENTRY 

## 2014-02-27 ENCOUNTER — Other Ambulatory Visit: Payer: Self-pay | Admitting: Dermatology

## 2014-02-27 DIAGNOSIS — C44329 Squamous cell carcinoma of skin of other parts of face: Secondary | ICD-10-CM | POA: Diagnosis not present

## 2014-02-27 DIAGNOSIS — C61 Malignant neoplasm of prostate: Secondary | ICD-10-CM | POA: Diagnosis not present

## 2014-02-27 DIAGNOSIS — Z85828 Personal history of other malignant neoplasm of skin: Secondary | ICD-10-CM | POA: Diagnosis not present

## 2014-02-27 DIAGNOSIS — R339 Retention of urine, unspecified: Secondary | ICD-10-CM | POA: Diagnosis not present

## 2014-02-27 DIAGNOSIS — D485 Neoplasm of uncertain behavior of skin: Secondary | ICD-10-CM | POA: Diagnosis not present

## 2014-03-07 ENCOUNTER — Ambulatory Visit (HOSPITAL_BASED_OUTPATIENT_CLINIC_OR_DEPARTMENT_OTHER): Payer: Medicare Other

## 2014-03-07 ENCOUNTER — Other Ambulatory Visit (HOSPITAL_BASED_OUTPATIENT_CLINIC_OR_DEPARTMENT_OTHER): Payer: Medicare Other

## 2014-03-07 DIAGNOSIS — Z5112 Encounter for antineoplastic immunotherapy: Secondary | ICD-10-CM | POA: Diagnosis not present

## 2014-03-07 DIAGNOSIS — C8590 Non-Hodgkin lymphoma, unspecified, unspecified site: Secondary | ICD-10-CM

## 2014-03-07 DIAGNOSIS — C8580 Other specified types of non-Hodgkin lymphoma, unspecified site: Secondary | ICD-10-CM

## 2014-03-07 DIAGNOSIS — Z87898 Personal history of other specified conditions: Secondary | ICD-10-CM

## 2014-03-07 DIAGNOSIS — Z8579 Personal history of other malignant neoplasms of lymphoid, hematopoietic and related tissues: Secondary | ICD-10-CM | POA: Diagnosis not present

## 2014-03-07 DIAGNOSIS — C8599 Non-Hodgkin lymphoma, unspecified, extranodal and solid organ sites: Secondary | ICD-10-CM

## 2014-03-07 DIAGNOSIS — E119 Type 2 diabetes mellitus without complications: Secondary | ICD-10-CM | POA: Diagnosis not present

## 2014-03-07 DIAGNOSIS — Z8546 Personal history of malignant neoplasm of prostate: Secondary | ICD-10-CM

## 2014-03-07 LAB — CBC WITH DIFFERENTIAL/PLATELET
BASO%: 0.2 % (ref 0.0–2.0)
Basophils Absolute: 0 10*3/uL (ref 0.0–0.1)
EOS%: 4.9 % (ref 0.0–7.0)
Eosinophils Absolute: 0.3 10*3/uL (ref 0.0–0.5)
HEMATOCRIT: 34.4 % — AB (ref 38.4–49.9)
HGB: 11.4 g/dL — ABNORMAL LOW (ref 13.0–17.1)
LYMPH%: 6.6 % — AB (ref 14.0–49.0)
MCH: 28.9 pg (ref 27.2–33.4)
MCHC: 33.1 g/dL (ref 32.0–36.0)
MCV: 87.1 fL (ref 79.3–98.0)
MONO#: 0.4 10*3/uL (ref 0.1–0.9)
MONO%: 7.2 % (ref 0.0–14.0)
NEUT#: 4.3 10*3/uL (ref 1.5–6.5)
NEUT%: 81.1 % — ABNORMAL HIGH (ref 39.0–75.0)
PLATELETS: 115 10*3/uL — AB (ref 140–400)
RBC: 3.95 10*6/uL — AB (ref 4.20–5.82)
RDW: 14.8 % — ABNORMAL HIGH (ref 11.0–14.6)
WBC: 5.3 10*3/uL (ref 4.0–10.3)
lymph#: 0.4 10*3/uL — ABNORMAL LOW (ref 0.9–3.3)

## 2014-03-07 LAB — COMPREHENSIVE METABOLIC PANEL (CC13)
ALK PHOS: 64 U/L (ref 40–150)
ALT: 25 U/L (ref 0–55)
AST: 22 U/L (ref 5–34)
Albumin: 3.9 g/dL (ref 3.5–5.0)
Anion Gap: 3 mEq/L (ref 3–11)
BUN: 19.3 mg/dL (ref 7.0–26.0)
CO2: 24 mEq/L (ref 22–29)
Calcium: 9.8 mg/dL (ref 8.4–10.4)
Chloride: 108 mEq/L (ref 98–109)
Creatinine: 1.4 mg/dL — ABNORMAL HIGH (ref 0.7–1.3)
Glucose: 277 mg/dl — ABNORMAL HIGH (ref 70–140)
Potassium: 4 mEq/L (ref 3.5–5.1)
Sodium: 135 mEq/L — ABNORMAL LOW (ref 136–145)
Total Bilirubin: 0.38 mg/dL (ref 0.20–1.20)
Total Protein: 6.5 g/dL (ref 6.4–8.3)

## 2014-03-07 LAB — LACTATE DEHYDROGENASE (CC13): LDH: 145 U/L (ref 125–245)

## 2014-03-07 MED ORDER — ACETAMINOPHEN 325 MG PO TABS
ORAL_TABLET | ORAL | Status: AC
Start: 2014-03-07 — End: 2014-03-07
  Filled 2014-03-07: qty 2

## 2014-03-07 MED ORDER — ACETAMINOPHEN 325 MG PO TABS
650.0000 mg | ORAL_TABLET | Freq: Once | ORAL | Status: AC
  Administered 2014-03-07: 650 mg via ORAL

## 2014-03-07 MED ORDER — DIPHENHYDRAMINE HCL 25 MG PO CAPS
ORAL_CAPSULE | ORAL | Status: AC
  Filled 2014-03-07: qty 1

## 2014-03-07 MED ORDER — SODIUM CHLORIDE 0.9 % IV SOLN
375.0000 mg/m2 | Freq: Once | INTRAVENOUS | Status: AC
  Administered 2014-03-07: 800 mg via INTRAVENOUS
  Filled 2014-03-07: qty 80

## 2014-03-07 MED ORDER — SODIUM CHLORIDE 0.9 % IJ SOLN
10.0000 mL | INTRAMUSCULAR | Status: DC | PRN
Start: 2014-03-07 — End: 2014-03-07
  Administered 2014-03-07: 10 mL
  Filled 2014-03-07: qty 10

## 2014-03-07 MED ORDER — SODIUM CHLORIDE 0.9 % IV SOLN
Freq: Once | INTRAVENOUS | Status: AC
  Administered 2014-03-07: 09:00:00 via INTRAVENOUS

## 2014-03-07 MED ORDER — HEPARIN SOD (PORK) LOCK FLUSH 100 UNIT/ML IV SOLN
500.0000 [IU] | Freq: Once | INTRAVENOUS | Status: AC | PRN
  Administered 2014-03-07: 500 [IU]
  Filled 2014-03-07: qty 5

## 2014-03-07 MED ORDER — DIPHENHYDRAMINE HCL 25 MG PO CAPS
25.0000 mg | ORAL_CAPSULE | Freq: Once | ORAL | Status: AC
  Administered 2014-03-07: 25 mg via ORAL

## 2014-03-07 NOTE — Patient Instructions (Signed)
[facility] Discharge Instructions for Patients Receiving Chemotherapy  Today you received the following chemotherapy agents Rituxan.  To help prevent nausea and vomiting after your treatment, we encourage you to take your nausea medication as prescribed.   If you develop nausea and vomiting that is not controlled by your nausea medication, call the clinic.   BELOW ARE SYMPTOMS THAT SHOULD BE REPORTED IMMEDIATELY:  *FEVER GREATER THAN 100.5 F  *CHILLS WITH OR WITHOUT FEVER  NAUSEA AND VOMITING THAT IS NOT CONTROLLED WITH YOUR NAUSEA MEDICATION  *UNUSUAL [name]  *UNUSUAL BRUISING OR BLEEDING  TENDERNESS IN MOUTH AND THROAT WITH OR WITHOUT PRESENCE OF ULCERS  *URINARY PROBLEMS  *BOWEL PROBLEMS  UNUSUAL RASH Items with * indicate a potential emergency and should be followed up as soon as possible.  Feel free to call the clinic you have any questions or concerns. The clinic phone number is [phone].    

## 2014-03-10 LAB — BETA 2 MICROGLOBULIN, SERUM: Beta-2 Microglobulin: 4.39 mg/L — ABNORMAL HIGH (ref <=2.51)

## 2014-03-13 DIAGNOSIS — H1131 Conjunctival hemorrhage, right eye: Secondary | ICD-10-CM | POA: Diagnosis not present

## 2014-04-04 DIAGNOSIS — L821 Other seborrheic keratosis: Secondary | ICD-10-CM | POA: Diagnosis not present

## 2014-04-04 DIAGNOSIS — L570 Actinic keratosis: Secondary | ICD-10-CM | POA: Diagnosis not present

## 2014-04-04 DIAGNOSIS — Z85828 Personal history of other malignant neoplasm of skin: Secondary | ICD-10-CM | POA: Diagnosis not present

## 2014-04-04 DIAGNOSIS — K130 Diseases of lips: Secondary | ICD-10-CM | POA: Diagnosis not present

## 2014-04-15 ENCOUNTER — Other Ambulatory Visit (HOSPITAL_COMMUNITY): Payer: Self-pay | Admitting: Urology

## 2014-04-15 DIAGNOSIS — C61 Malignant neoplasm of prostate: Secondary | ICD-10-CM

## 2014-04-21 ENCOUNTER — Encounter (HOSPITAL_COMMUNITY): Payer: Self-pay

## 2014-04-21 ENCOUNTER — Encounter (HOSPITAL_COMMUNITY)
Admission: RE | Admit: 2014-04-21 | Discharge: 2014-04-21 | Disposition: A | Discharge status: Home / Self Care | Payer: Medicare Other | Source: Ambulatory Visit | Attending: Oncology | Admitting: Oncology

## 2014-04-21 DIAGNOSIS — C8589 Other specified types of non-Hodgkin lymphoma, extranodal and solid organ sites: Secondary | ICD-10-CM | POA: Diagnosis not present

## 2014-04-21 DIAGNOSIS — R197 Diarrhea, unspecified: Secondary | ICD-10-CM

## 2014-04-21 DIAGNOSIS — C8299 Follicular lymphoma, unspecified, extranodal and solid organ sites: Secondary | ICD-10-CM | POA: Diagnosis not present

## 2014-04-21 LAB — GLUCOSE, CAPILLARY: Glucose-Capillary: 197 mg/dL — ABNORMAL HIGH (ref 70–99)

## 2014-04-21 MED ORDER — FLUDEOXYGLUCOSE F - 18 (FDG) INJECTION
9.1000 | Freq: Once | INTRAVENOUS | Status: AC | PRN
  Administered 2014-04-21: 9.1 via INTRAVENOUS

## 2014-05-01 ENCOUNTER — Other Ambulatory Visit: Payer: Self-pay | Admitting: Dermatology

## 2014-05-01 DIAGNOSIS — C44319 Basal cell carcinoma of skin of other parts of face: Secondary | ICD-10-CM | POA: Diagnosis not present

## 2014-05-01 DIAGNOSIS — Z85828 Personal history of other malignant neoplasm of skin: Secondary | ICD-10-CM | POA: Diagnosis not present

## 2014-05-01 DIAGNOSIS — L821 Other seborrheic keratosis: Secondary | ICD-10-CM | POA: Diagnosis not present

## 2014-05-01 DIAGNOSIS — L570 Actinic keratosis: Secondary | ICD-10-CM | POA: Diagnosis not present

## 2014-05-01 DIAGNOSIS — D485 Neoplasm of uncertain behavior of skin: Secondary | ICD-10-CM | POA: Diagnosis not present

## 2014-05-02 ENCOUNTER — Telehealth: Payer: Self-pay | Admitting: Oncology

## 2014-05-02 ENCOUNTER — Telehealth: Payer: Self-pay | Admitting: *Deleted

## 2014-05-02 ENCOUNTER — Ambulatory Visit (HOSPITAL_BASED_OUTPATIENT_CLINIC_OR_DEPARTMENT_OTHER): Payer: Medicare Other | Admitting: Oncology

## 2014-05-02 ENCOUNTER — Other Ambulatory Visit (HOSPITAL_BASED_OUTPATIENT_CLINIC_OR_DEPARTMENT_OTHER): Payer: Medicare Other

## 2014-05-02 ENCOUNTER — Ambulatory Visit (HOSPITAL_BASED_OUTPATIENT_CLINIC_OR_DEPARTMENT_OTHER): Payer: Medicare Other

## 2014-05-02 VITALS — BP 130/58 | HR 88 | Temp 98.4°F | Resp 18 | Ht 67.0 in | Wt 188.0 lb

## 2014-05-02 DIAGNOSIS — C8599 Non-Hodgkin lymphoma, unspecified, extranodal and solid organ sites: Secondary | ICD-10-CM

## 2014-05-02 DIAGNOSIS — I4891 Unspecified atrial fibrillation: Secondary | ICD-10-CM

## 2014-05-02 DIAGNOSIS — C8580 Other specified types of non-Hodgkin lymphoma, unspecified site: Secondary | ICD-10-CM

## 2014-05-02 DIAGNOSIS — Z5112 Encounter for antineoplastic immunotherapy: Secondary | ICD-10-CM | POA: Diagnosis not present

## 2014-05-02 DIAGNOSIS — E1129 Type 2 diabetes mellitus with other diabetic kidney complication: Secondary | ICD-10-CM | POA: Insufficient documentation

## 2014-05-02 DIAGNOSIS — E119 Type 2 diabetes mellitus without complications: Secondary | ICD-10-CM | POA: Diagnosis not present

## 2014-05-02 DIAGNOSIS — Z8546 Personal history of malignant neoplasm of prostate: Secondary | ICD-10-CM | POA: Diagnosis not present

## 2014-05-02 DIAGNOSIS — C8339 Diffuse large B-cell lymphoma, extranodal and solid organ sites: Secondary | ICD-10-CM | POA: Diagnosis not present

## 2014-05-02 DIAGNOSIS — R197 Diarrhea, unspecified: Secondary | ICD-10-CM

## 2014-05-02 DIAGNOSIS — C8590 Non-Hodgkin lymphoma, unspecified, unspecified site: Secondary | ICD-10-CM

## 2014-05-02 DIAGNOSIS — C61 Malignant neoplasm of prostate: Secondary | ICD-10-CM | POA: Diagnosis not present

## 2014-05-02 DIAGNOSIS — Z8579 Personal history of other malignant neoplasms of lymphoid, hematopoietic and related tissues: Secondary | ICD-10-CM | POA: Diagnosis not present

## 2014-05-02 DIAGNOSIS — C8589 Other specified types of non-Hodgkin lymphoma, extranodal and solid organ sites: Secondary | ICD-10-CM | POA: Diagnosis not present

## 2014-05-02 DIAGNOSIS — Z87898 Personal history of other specified conditions: Secondary | ICD-10-CM

## 2014-05-02 DIAGNOSIS — Z7984 Long term (current) use of oral hypoglycemic drugs: Secondary | ICD-10-CM

## 2014-05-02 LAB — COMPREHENSIVE METABOLIC PANEL (CC13)
ALT: 36 U/L (ref 0–55)
AST: 31 U/L (ref 5–34)
Albumin: 4 g/dL (ref 3.5–5.0)
Alkaline Phosphatase: 70 U/L (ref 40–150)
Anion Gap: 10 mEq/L (ref 3–11)
BILIRUBIN TOTAL: 0.52 mg/dL (ref 0.20–1.20)
BUN: 18.8 mg/dL (ref 7.0–26.0)
CALCIUM: 9.4 mg/dL (ref 8.4–10.4)
CHLORIDE: 102 meq/L (ref 98–109)
CO2: 26 mEq/L (ref 22–29)
CREATININE: 1.4 mg/dL — AB (ref 0.7–1.3)
EGFR: 49 mL/min/{1.73_m2} — AB (ref >90)
Glucose: 279 mg/dl — ABNORMAL HIGH (ref 70–140)
Potassium: 3.8 mEq/L (ref 3.5–5.1)
Sodium: 138 mEq/L (ref 136–145)
Total Protein: 6.5 g/dL (ref 6.4–8.3)

## 2014-05-02 LAB — CBC WITH DIFFERENTIAL/PLATELET
BASO%: 0.2 % (ref 0.0–2.0)
Basophils Absolute: 0 10*3/uL (ref 0.0–0.1)
EOS ABS: 0.3 10*3/uL (ref 0.0–0.5)
EOS%: 5.2 % (ref 0.0–7.0)
HEMATOCRIT: 34.4 % — AB (ref 38.4–49.9)
HEMOGLOBIN: 11.7 g/dL — AB (ref 13.0–17.1)
LYMPH%: 6.9 % — ABNORMAL LOW (ref 14.0–49.0)
MCH: 29.5 pg (ref 27.2–33.4)
MCHC: 34 g/dL (ref 32.0–36.0)
MCV: 86.6 fL (ref 79.3–98.0)
MONO#: 0.3 10*3/uL (ref 0.1–0.9)
MONO%: 6.5 % (ref 0.0–14.0)
NEUT#: 3.9 10*3/uL (ref 1.5–6.5)
NEUT%: 81.2 % — AB (ref 39.0–75.0)
Platelets: 114 10*3/uL — ABNORMAL LOW (ref 140–400)
RBC: 3.97 10*6/uL — ABNORMAL LOW (ref 4.20–5.82)
RDW: 14.8 % — AB (ref 11.0–14.6)
WBC: 4.8 10*3/uL (ref 4.0–10.3)
lymph#: 0.3 10*3/uL — ABNORMAL LOW (ref 0.9–3.3)

## 2014-05-02 LAB — LACTATE DEHYDROGENASE (CC13): LDH: 139 U/L (ref 125–245)

## 2014-05-02 MED ORDER — SODIUM CHLORIDE 0.9 % IV SOLN
Freq: Once | INTRAVENOUS | Status: AC
  Administered 2014-05-02: 10:00:00 via INTRAVENOUS

## 2014-05-02 MED ORDER — SODIUM CHLORIDE 0.9 % IJ SOLN
10.0000 mL | INTRAMUSCULAR | Status: DC | PRN
  Administered 2014-05-02: 10 mL
  Filled 2014-05-02: qty 10

## 2014-05-02 MED ORDER — CHOLESTYRAMINE 4 G PO PACK: 4.0000 g | PACK | Freq: Three times a day (TID) | ORAL | Status: DC

## 2014-05-02 MED ORDER — HEPARIN SOD (PORK) LOCK FLUSH 100 UNIT/ML IV SOLN
500.0000 [IU] | Freq: Once | INTRAVENOUS | Status: AC | PRN
  Administered 2014-05-02: 500 [IU]
  Filled 2014-05-02: qty 5

## 2014-05-02 MED ORDER — ACETAMINOPHEN 325 MG PO TABS
650.0000 mg | ORAL_TABLET | Freq: Once | ORAL | Status: AC
  Administered 2014-05-02: 650 mg via ORAL

## 2014-05-02 MED ORDER — DIPHENHYDRAMINE HCL 25 MG PO CAPS
25.0000 mg | ORAL_CAPSULE | Freq: Once | ORAL | Status: AC
  Administered 2014-05-02: 25 mg via ORAL

## 2014-05-02 MED ORDER — DIPHENHYDRAMINE HCL 25 MG PO CAPS
ORAL_CAPSULE | ORAL | Status: AC
  Filled 2014-05-02: qty 1

## 2014-05-02 MED ORDER — SODIUM CHLORIDE 0.9 % IV SOLN
375.0000 mg/m2 | Freq: Once | INTRAVENOUS | Status: AC
  Administered 2014-05-02: 800 mg via INTRAVENOUS
  Filled 2014-05-02: qty 80

## 2014-05-02 MED ORDER — ACETAMINOPHEN 325 MG PO TABS
ORAL_TABLET | ORAL | Status: AC
  Filled 2014-05-02: qty 2

## 2014-05-02 NOTE — Progress Notes (Signed)
ID: [name] OB: 1932/03/01  MR#: [id number]  GLO#:[id number]  PCP: Horatio Pel, MD GYN:   SU: [name] OTHER MD: [name], [name],  [name], [name], Raynelle Bring  CHIEF COMPLAINT: Non-Hodgkin's lymphoma  CURRENT TREATMENT: Maintenance rituximab    NON-HODGKIN"S LYMPHOMA HISTORY: From the prior summary:  The patient developed right upper quadrant pain last year and he had an ultrasound April 5th which showed some gallstones without evidence of cholecystitis or ductal dilatation.  However, there were multiple lesions in the liver which could not be assessed further.  Accordingly on July 31, 2003, [name], showed multiple liver masses, more than 25, most over 1 cm, the largest being in the inferior right lobe, measuring 4.2 cm. There was also a small mass in the spleen.  CT of the pelvis was unremarkable.   The patient had a biopsy of the liver August 01, 2003.  The report says only that it was "a lesion in the right lobe of the liver".  Presumably this was the largest lesion present.  The final pathology [id number] and [id number]) showed only cirrhosis.     The patient has been followed by [name], and a repeat CT scan of the abdomen and pelvis was obtained May 18, 2004.  Many of the liver lesions seen previously had actually decreased in size.  However, [name], [name], a second ultrasound-guided biopsy was performed 06/11/04. This was a lesion deep in the left lobe of the liver and therefore, I would think not the same one previously biopsied which was in the right liver. The pathology  this time [id number]) shows a poorly differentiated neuroendocrine carcinoma which was positive for chromogranin A, negative for synaptophysin, thyroid transcription factor, a variety of cytokeratins, [name], alpha-fetoprotein and COX-2."  [name] was subsequently evaluated at [facility] by Dr. Otelia Limes and repeat liver biopsy and review of the earlier biopsy here showed a primary hepatic lymphoma. The patient was treated with R-CHOP as detailed below and achieved a complete response. He received maintenance rituximab until 2008  His subsequent history is as detailed below  INTERVAL HISTORY. [name] returns today with one of his daughters for followup of his breast cancer. Today is day 1 cycle 5 of every 8 week rituximab maintenance. He is tolerating the rituximab with no side effects that he is aware of. In particularly have been no reactions to the treatments. As far as his lymphoma is concerned, he denies significant weight loss, significant fatigue, rash, pruritus, drenching sweats, or fevers. He is not aware of any adenopathy. He just had a restaging PET scan which is entirely stable as compared to last Monday  REVIEW OF SYSTEMS:  Of course he has multiple other problems including atrial fibrillation for which she is on Rivaroxaban. There has been no recent bleeding, although he did have a right conjunctival bleed some weeks ago, treated at [facility]. His prostate cancer is being followed now by Dr. [name]. They're going to be obtaining a bone scan according to the patient. A C is getting more diarrhea than usual  from his metformin. His appetite is fairly good however. His hearing continues to be poor. He has a little bit of a runny nose, but no cough and only minimal shortness of breath when walking up stairs. He went to the dermatologist yesterday and had another squamous cell removed from his last temple. He has a little bit of balance issues but no recent falls. He feels forgetful, but not  anxious or depressed. A detailed review of systems today was otherwise stable  PAST MEDICAL HISTORY: Past Medical History  Diagnosis Date  . Hypertension   . Cancer of liver   . Kidney stone   . Prostate cancer   . Skin cancer   . Arthritis   . Anemia   . GERD (gastroesophageal reflux disease)   . Lymphoma   . [name]   . Sleep apnea   . Neuropathy in diabetes     Hx: of  . Diabetes mellitus     INSULIN DEPENDENT  . HX, PERSONAL, MALIGNANCY, PROSTATE 07/28/2006    Annotation: 2001, resected Qualifier: Diagnosis of  By: [name], [name]    . A-fib   . Near syncope 10/18/2013  . Memory deficit 10/18/2013  . HOH (hard of hearing)   Significant for prostate cancer, the patient undergoing prostatectomy January 2001 under [facility] for a Gleason 7, [name], minor coronary artery disease, hypertension, hypertriglyceridemia, cholelithiasis, colon polyps, cirrhosis by biopsy, diabetes, [name], history of nephrolithiasis, status post right renal surgery and history of left rotator cuff repair under [name].    PAST SURGICAL HISTORY: Past Surgical History  Procedure Laterality Date  . Prostatectomy    . Kidney stone surgery    . Rotator cuff repair    . Ankle surgery    . Cardiac catheterization  09/16/96    Normal LV systolic function,dense ca+ prox. portion of the LAD w/50% narrowing in the distal portion, 30-40% irreg. in the proximal portion & 80% narrowing in the ostial portion of the posterolateral branch.  . Colonoscopy      Hx: of  . Infusion port  04/04/2013    RIGHT SUBCLAVIAN  . Cholecystectomy  04/04/2013  . Cholecystectomy N/A 04/04/2013    Procedure: LAPAROSCOPIC CHOLECYSTECTOMY WITH INTRAOPERATIVE CHOLANGIOGRAM;  [name], MD;  Location: [facility];  Service: General;  [name];  . Portacath placement N/A 04/04/2013    Procedure: INSERTION PORT-A-CATH;  [name], MD;  Location: [facility];  Service: General;  [name];    FAMILY HISTORY Family History  Problem Relation Age of Onset  . Heart disease Father   . Heart attack Father   . Cancer Sister   . Heart attack Brother   . Heart attack Brother   . Heart attack Brother   . Heart attack Brother   . Heart attack Brother   . Cancer Brother   . Cancer Brother   The patient's father died at the age of 54 from an MI.  The patient's mother died from "old age" at 14.  The patient is one of nine siblings.  One brother died from cancer of the esophagus, 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  He is now retired.  His wife, [name], a homemaker, died in 06/28/12: [name] is an Scientist, physiological for [employer]; [name], [name]; and [name] is Environmental consultant.  Everybody lives in [place].  The patient has five grandchildren.  He is a member of [place]. [employer].    ADVANCED DIRECTIVES: in place   HEALTH MAINTENANCE: History  Substance Use Topics  . Smoking status: Former Smoker    Types: Pipe  . Smokeless tobacco: Former Systems developer    Types: Chew     Comment: [place] YEARS AGO "  . Alcohol Use: No     Colonoscopy:  PSA: Followed by Dr. [name]  Bone density:  Lipid panel:  Allergies  Allergen Reactions  . Niacin Other (See Comments)    headaches    Current Outpatient Prescriptions  Medication Sig Dispense Refill  . allopurinol (ZYLOPRIM) 300 MG tablet TAKE 1 TABLET (300 MG TOTAL) BY MOUTH DAILY. 30 tablet 3  .  amLODipine (NORVASC) 5 MG tablet Take 2.5 mg by mouth daily.     Marland Kitchen atorvastatin (LIPITOR) 20 MG tablet Take 20 mg by mouth at bedtime.    . cetirizine (ZYRTEC) 10 MG tablet Take 10 mg by mouth daily.    . cholecalciferol (VITAMIN D) 1000 UNITS tablet Take 1,000 Units by mouth daily.    . Cyanocobalamin (VITAMIN B 12 PO) Take 2,500 mg by mouth daily.     . Ezetimibe (ZETIA PO) Take 5 mg by mouth at bedtime.    . fenofibrate 160 MG tablet Take 160 mg by mouth at bedtime.    . gabapentin (NEURONTIN) 300 MG capsule Take 300 mg by mouth 3 (three) times daily.     . insulin NPH Human (HUMULIN N,NOVOLIN N) 100 UNIT/ML injection Inject 40 Units into the skin 2 (two) times daily before a meal. 35 units in the morning and 40 units at bedtime.    . magnesium oxide (MAG-OX) 400 MG tablet Take 400 mg by mouth 2 (two) times daily.    . metFORMIN (GLUCOPHAGE) 1000 MG tablet Take 1,000 mg by mouth 2 (two) times daily with a meal.    . Multiple Vitamins-Minerals (MULTIVITAMIN WITH MINERALS) tablet Take 1 tablet by mouth daily.    . sertraline (ZOLOFT) 100 MG tablet Take 100 mg by mouth daily.    . vitamin C (ASCORBIC ACID) 500 MG tablet Take 500 mg by mouth daily.    Alveda Reasons 20 MG TABS tablet Take 20 mg by mouth daily with supper.      No current facility-administered medications for this visit.    Objective: Elderly white man who appears stated age  20 Vitals:   05/02/14 0855  BP: 130/58  Pulse: 88  Temp: 98.4 F (36.9 C)  Resp: 18     Body mass index is 29.44 kg/(m^2).      ECOG FS:1 - Symptomatic but completely ambulatory  Sclerae unicteric, pupils round and equal Oropharynx clear and moist-- no thrush or other lesions No cervical or supraclavicular adenopathy, no axillary or inguinal adenopathy Lungs no rales or rhonchi Heart regular rate and rhythm today Abd soft, nontender, positive bowel sounds, no masses palpated MSK no focal spinal tenderness Neuro: nonfocal, well oriented,  positive affect  LAB RESULTS: Results for Michael, [name] (MRN [id number]) as of 05/02/2014 09:19  Ref. Range 03/20/2013 09:20 04/29/2013 17:34  PSA Latest Range: <=4.00 ng/mL 8.01 (H) 7.77 (H)   CMP     Component Value Date/Time   NA 138 05/02/2014 0824   NA 137 08/19/2013 0825   K 3.8 05/02/2014 0824   K 3.7 08/19/2013 0825   CL 100 08/19/2013 0825   CO2 26 05/02/2014 0824   CO2 24 08/19/2013 0825   GLUCOSE 279* 05/02/2014 0824   GLUCOSE 207* 08/19/2013 0825   BUN 18.8 05/02/2014 0824   BUN 13 08/19/2013 0825   CREATININE 1.4* 05/02/2014 0824   CREATININE 0.92 08/19/2013 0825   CALCIUM 9.4 05/02/2014 0824   CALCIUM 9.6 08/19/2013 0825   CALCIUM 11.0* 08/17/2005 0842   PROT 6.5 05/02/2014 0824   PROT 5.9* 08/16/2013 1253   ALBUMIN 4.0 05/02/2014 0824   ALBUMIN 3.5 08/16/2013 1253   AST 31 05/02/2014 0824   AST 24 08/16/2013 1253   ALT 36 05/02/2014 0824   ALT 21 08/16/2013 1253   ALKPHOS 70 05/02/2014 0824   ALKPHOS 54 08/16/2013 1253   BILITOT 0.52 05/02/2014 0824   BILITOT 0.3 08/16/2013 1253   GFRNONAA 78* 08/19/2013 0825   GFRAA >90 08/19/2013 0825    No results found for: SPEP  Lab Results  Component Value Date   WBC 4.8 05/02/2014   NEUTROABS 3.9 05/02/2014   HGB 11.7* 05/02/2014   HCT 34.4* 05/02/2014   MCV 86.6 05/02/2014   PLT 114* 05/02/2014      Chemistry      Component Value Date/Time   NA 138 05/02/2014 0824   NA 137 08/19/2013 0825   K 3.8 05/02/2014 0824   K 3.7 08/19/2013 0825   CL 100 08/19/2013 0825   CO2 26 05/02/2014 0824   CO2 24 08/19/2013 0825   BUN 18.8 05/02/2014 0824   BUN 13 08/19/2013 0825   CREATININE 1.4* 05/02/2014 0824   CREATININE 0.92 08/19/2013 0825      Component Value Date/Time   CALCIUM 9.4 05/02/2014 0824   CALCIUM 9.6 08/19/2013 0825   CALCIUM 11.0* 08/17/2005 0842   ALKPHOS 70 05/02/2014 0824   ALKPHOS 54 08/16/2013 1253   AST 31 05/02/2014 0824   AST 24 08/16/2013 1253   ALT 36 05/02/2014 0824   ALT  21 08/16/2013 1253   BILITOT 0.52 05/02/2014 0824   BILITOT 0.3 08/16/2013 1253      Urinalysis    Component Value Date/Time   COLORURINE YELLOW 08/16/2013 [place] 08/16/2013 1317   LABSPEC 1.009 08/16/2013 1317   PHURINE 6.5 08/16/2013 [place] 08/16/2013 1317   HGBUR NEGATIVE 08/16/2013 1317   BILIRUBINUR NEGATIVE 08/16/2013 1317   KETONESUR NEGATIVE 08/16/2013 1317   PROTEINUR NEGATIVE 08/16/2013 1317   UROBILINOGEN 0.2 08/16/2013 1317   NITRITE NEGATIVE 08/16/2013 1317   LEUKOCYTESUR NEGATIVE 08/16/2013 1317    STUDIES: Nm Pet Image Restag (ps) Skull Base To Thigh  04/21/2014   CLINICAL DATA:  Subsequent treatment strategy for nodular lymphoma of extranodal and or solid organ site.  EXAM: NUCLEAR MEDICINE PET SKULL BASE TO THIGH  TECHNIQUE: 9.1 mCi F-18 FDG was injected intravenously. Full-ring PET imaging was performed from the skull base to thigh after the radiotracer. CT data was obtained and used for attenuation correction and anatomic localization.  FASTING BLOOD GLUCOSE:  Value: 197 mg/dl  COMPARISON:  08/23/2013  FINDINGS: NECK  No hypermetabolic lymph nodes in the neck.  CHEST  No hypermetabolic mediastinal or hilar nodes. No  suspicious pulmonary nodules on the CT scan. Previously noted focus of ground-glass opacity in the right upper lobe is no longer visualized, consistent with resolving inflammatory process.  ABDOMEN/PELVIS  Densely calcified lesion in the left hepatic lobe shows no associated hypermetabolic activity.  Poorly defined retroperitoneal soft tissue density in the aortocaval space currently measures 1.7 x 2.5 cm on image 123 of series 5, compared to 1.8 x 2.6 cm previously. This shows mild metabolic activity with SUV max of 2.9 compared with 3.2 previously. This remains equal to or less than liver ([place] 3).  No other hypermetabolic lymphadenopathy identified within the abdomen or pelvis. Sigmoid diverticulosis again noted.   SKELETON  No focal hypermetabolic activity to suggest skeletal metastasis.  IMPRESSION: No significant change in mildly hypermetabolic retroperitoneal lymphadenopathy in the aortocaval space ([place]ville 3).  No new or progressive disease identified.  Resolution of focal ground-glass opacity in right upper lobe, consistent with resolving inflammatory process.   Electronically Signed   By: Earle Gell M.D.   On: 04/21/2014 10:37     ASSESSMENT:  81 y.o. patient with a diagnosis of    #1 Primary hepatic non-Hodgkin's lymphoma established through liver biopsy February 2006, treated with Rituxan x9 and then CHOP x6.  All chemotherapy completed in 2007.  Maintenance Rituxan discontinued October 2008.     #2  Biopsy proven retroperitoneal recurrence documented November 2014. Bone marrow biopsy 04/01/2013 was negative   #3 status post  laparoscopic cholecystectomy and port placement on 04/04/2013.  #4 Cycle #1  RICE chemotherapy  completed on 05/02/2013.   #5 cycle #2 RICE chemotherapy  on 05/17/2013  #6 PET scan performed on 05/23/2013 revealed marked partial response to chemotherapy with a retroperitoneal mass decrease in metabolic activity from SUV of 13.2 to 6.3. Right adrenal mass size and metabolic activity is resolved when compared to the previous PET scan.   #7 Evaluated at [facility] by Dr. Tomasa Hosteller for autologous transplant on 05/27/2013 and was quoted a 3% chance of significant heart damage and possibly death from the treatment and a 50% chance of being alive 5 years from now after transplant (versus perhaps 2 years without). After much thought the patient and family have decided firmly they do not wish to proceed to stem cell transplant.  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 treated with vancomycin IV x14 days completed 07/02/2013, followed by doxycycline for 2 additional weeks  #10 started maintenance Rituxan May 2015  (1 dose every 8 weeks)  #11 A-fib, on rivaroxaban  #12 prostate cancer-- under observation  PLAN: [name] is tolerating the rituximab with no side effects that he is aware of. The PET scan is totally stable and clinically he gives me no symptoms of active disease.   Accordingly the plan is going to be to continue the rituximab every 8 weeks. We will continue to follow lab work each visit.  He is having some diarrhea most likely from his metformin. I wrote him a prescription for Questran and he can take it once or twice daily as needed to bring that problem under control so he can take as diabetes medications appropriately.  [name] has a good understanding of the overall plan. He agrees with it. He knows the goal of treatment in his case is control. He will call with any problems that may develop before his next visit here.  [name], MD   05/02/2014

## 2014-05-02 NOTE — Telephone Encounter (Signed)
, °

## 2014-05-02 NOTE — Addendum Note (Signed)
Addended by: Laureen Abrahams on: 05/02/2014 06:17 PM   Modules accepted: Medications

## 2014-05-02 NOTE — Telephone Encounter (Signed)
Per staff message and POF I have scheduled appts. Advised scheduler of appts. JMW  

## 2014-05-02 NOTE — Patient Instructions (Signed)
[facility] Discharge Instructions for Patients Receiving Chemotherapy  Today you received the following chemotherapy agents Ritixan  To help prevent nausea and vomiting after your treatment, we encourage you to take your nausea medication   If you develop nausea and vomiting that is not controlled by your nausea medication, call the clinic.   BELOW ARE SYMPTOMS THAT SHOULD BE REPORTED IMMEDIATELY:  *FEVER GREATER THAN 100.5 F    *CHILLS WITH OR WITHOUT FEVER  NAUSEA AND VOMITING THAT IS NOT CONTROLLED WITH YOUR NAUSEA MEDICATION  *UNUSUAL [name]  *UNUSUAL BRUISING OR BLEEDING  TENDERNESS IN MOUTH AND THROAT WITH OR WITHOUT PRESENCE OF ULCERS  *URINARY PROBLEMS  *BOWEL PROBLEMS  UNUSUAL RASH Items with * indicate a potential emergency and should be followed up as soon as possible.  Feel free to call the clinic you have any questions or concerns. The clinic phone number is [phone].

## 2014-05-05 LAB — BETA 2 MICROGLOBULIN, SERUM: Beta-2 Microglobulin: 4.18 mg/L — ABNORMAL HIGH (ref <=2.51)

## 2014-05-20 ENCOUNTER — Encounter (HOSPITAL_COMMUNITY)
Admission: RE | Admit: 2014-05-20 | Discharge: 2014-05-20 | Disposition: A | Discharge status: Home / Self Care | Payer: Medicare Other | Source: Ambulatory Visit | Attending: Urology | Admitting: Urology

## 2014-05-20 DIAGNOSIS — N1330 Unspecified hydronephrosis: Secondary | ICD-10-CM | POA: Insufficient documentation

## 2014-05-20 DIAGNOSIS — C61 Malignant neoplasm of prostate: Secondary | ICD-10-CM | POA: Insufficient documentation

## 2014-05-20 DIAGNOSIS — R972 Elevated prostate specific antigen [PSA]: Secondary | ICD-10-CM | POA: Insufficient documentation

## 2014-05-20 DIAGNOSIS — Z8546 Personal history of malignant neoplasm of prostate: Secondary | ICD-10-CM | POA: Diagnosis not present

## 2014-05-20 MED ORDER — TECHNETIUM TC 99M MEDRONATE IV KIT
23.0000 | PACK | Freq: Once | INTRAVENOUS | Status: AC | PRN
  Administered 2014-05-20: 23 via INTRAVENOUS

## 2014-05-21 DIAGNOSIS — C44319 Basal cell carcinoma of skin of other parts of face: Secondary | ICD-10-CM | POA: Diagnosis not present

## 2014-05-21 DIAGNOSIS — Z85828 Personal history of other malignant neoplasm of skin: Secondary | ICD-10-CM | POA: Diagnosis not present

## 2014-05-21 DIAGNOSIS — B351 Tinea unguium: Secondary | ICD-10-CM | POA: Diagnosis not present

## 2014-05-21 DIAGNOSIS — L570 Actinic keratosis: Secondary | ICD-10-CM | POA: Diagnosis not present

## 2014-05-25 ENCOUNTER — Other Ambulatory Visit: Payer: Self-pay | Admitting: Oncology

## 2014-05-27 DIAGNOSIS — C61 Malignant neoplasm of prostate: Secondary | ICD-10-CM | POA: Diagnosis not present

## 2014-05-29 DIAGNOSIS — E119 Type 2 diabetes mellitus without complications: Secondary | ICD-10-CM | POA: Diagnosis not present

## 2014-05-29 DIAGNOSIS — E785 Hyperlipidemia, unspecified: Secondary | ICD-10-CM | POA: Diagnosis not present

## 2014-05-29 DIAGNOSIS — I4891 Unspecified atrial fibrillation: Secondary | ICD-10-CM | POA: Diagnosis not present

## 2014-06-02 DIAGNOSIS — I10 Essential (primary) hypertension: Secondary | ICD-10-CM | POA: Diagnosis not present

## 2014-06-02 DIAGNOSIS — E785 Hyperlipidemia, unspecified: Secondary | ICD-10-CM | POA: Diagnosis not present

## 2014-06-02 DIAGNOSIS — E119 Type 2 diabetes mellitus without complications: Secondary | ICD-10-CM | POA: Diagnosis not present

## 2014-06-02 DIAGNOSIS — I4891 Unspecified atrial fibrillation: Secondary | ICD-10-CM | POA: Diagnosis not present

## 2014-06-27 ENCOUNTER — Other Ambulatory Visit: Payer: Self-pay | Admitting: *Deleted

## 2014-06-27 ENCOUNTER — Encounter: Payer: Self-pay | Admitting: Nurse Practitioner

## 2014-06-27 ENCOUNTER — Ambulatory Visit (HOSPITAL_BASED_OUTPATIENT_CLINIC_OR_DEPARTMENT_OTHER): Payer: Medicare Other

## 2014-06-27 ENCOUNTER — Other Ambulatory Visit (HOSPITAL_BASED_OUTPATIENT_CLINIC_OR_DEPARTMENT_OTHER): Payer: Medicare Other

## 2014-06-27 ENCOUNTER — Ambulatory Visit (HOSPITAL_BASED_OUTPATIENT_CLINIC_OR_DEPARTMENT_OTHER): Payer: Medicare Other | Admitting: Nurse Practitioner

## 2014-06-27 VITALS — BP 138/77 | HR 71 | Temp 98.1°F | Resp 20 | Wt 192.1 lb

## 2014-06-27 DIAGNOSIS — I4891 Unspecified atrial fibrillation: Secondary | ICD-10-CM

## 2014-06-27 DIAGNOSIS — C8590 Non-Hodgkin lymphoma, unspecified, unspecified site: Secondary | ICD-10-CM

## 2014-06-27 DIAGNOSIS — C8580 Other specified types of non-Hodgkin lymphoma, unspecified site: Secondary | ICD-10-CM

## 2014-06-27 DIAGNOSIS — Z5112 Encounter for antineoplastic immunotherapy: Secondary | ICD-10-CM | POA: Diagnosis not present

## 2014-06-27 DIAGNOSIS — C61 Malignant neoplasm of prostate: Secondary | ICD-10-CM

## 2014-06-27 DIAGNOSIS — Z8546 Personal history of malignant neoplasm of prostate: Secondary | ICD-10-CM | POA: Diagnosis not present

## 2014-06-27 DIAGNOSIS — Z87898 Personal history of other specified conditions: Secondary | ICD-10-CM

## 2014-06-27 DIAGNOSIS — E119 Type 2 diabetes mellitus without complications: Secondary | ICD-10-CM

## 2014-06-27 DIAGNOSIS — C8599 Non-Hodgkin lymphoma, unspecified, extranodal and solid organ sites: Secondary | ICD-10-CM

## 2014-06-27 LAB — COMPREHENSIVE METABOLIC PANEL (CC13)
ALBUMIN: 3.9 g/dL (ref 3.5–5.0)
ALT: 32 U/L (ref 0–55)
ANION GAP: 10 meq/L (ref 3–11)
AST: 35 U/L — ABNORMAL HIGH (ref 5–34)
Alkaline Phosphatase: 62 U/L (ref 40–150)
BUN: 19.7 mg/dL (ref 7.0–26.0)
CALCIUM: 9.7 mg/dL (ref 8.4–10.4)
CO2: 25 meq/L (ref 22–29)
CREATININE: 1.3 mg/dL (ref 0.7–1.3)
Chloride: 105 mEq/L (ref 98–109)
EGFR: 52 mL/min/{1.73_m2} — ABNORMAL LOW (ref >90)
Glucose: 201 mg/dl — ABNORMAL HIGH (ref 70–140)
Potassium: 3.6 mEq/L (ref 3.5–5.1)
SODIUM: 140 meq/L (ref 136–145)
TOTAL PROTEIN: 6.4 g/dL (ref 6.4–8.3)
Total Bilirubin: 0.47 mg/dL (ref 0.20–1.20)

## 2014-06-27 LAB — CBC WITH DIFFERENTIAL/PLATELET
BASO%: 0.2 % (ref 0.0–2.0)
BASOS ABS: 0 10*3/uL (ref 0.0–0.1)
EOS ABS: 0.2 10*3/uL (ref 0.0–0.5)
EOS%: 3.9 % (ref 0.0–7.0)
HEMATOCRIT: 32.8 % — AB (ref 38.4–49.9)
HEMOGLOBIN: 11.1 g/dL — AB (ref 13.0–17.1)
LYMPH%: 6.6 % — AB (ref 14.0–49.0)
MCH: 29.4 pg (ref 27.2–33.4)
MCHC: 33.8 g/dL (ref 32.0–36.0)
MCV: 86.8 fL (ref 79.3–98.0)
MONO#: 0.5 10*3/uL (ref 0.1–0.9)
MONO%: 8.8 % (ref 0.0–14.0)
NEUT#: 4.3 10*3/uL (ref 1.5–6.5)
NEUT%: 80.5 % — AB (ref 39.0–75.0)
PLATELETS: 122 10*3/uL — AB (ref 140–400)
RBC: 3.78 10*6/uL — ABNORMAL LOW (ref 4.20–5.82)
RDW: 15 % — ABNORMAL HIGH (ref 11.0–14.6)
WBC: 5.3 10*3/uL (ref 4.0–10.3)
lymph#: 0.4 10*3/uL — ABNORMAL LOW (ref 0.9–3.3)

## 2014-06-27 LAB — LACTATE DEHYDROGENASE (CC13): LDH: 144 U/L (ref 125–245)

## 2014-06-27 LAB — MAGNESIUM (CC13): Magnesium: 2.1 mg/dL (ref 1.5–2.5)

## 2014-06-27 MED ORDER — HEPARIN SOD (PORK) LOCK FLUSH 100 UNIT/ML IV SOLN
500.0000 [IU] | Freq: Once | INTRAVENOUS | Status: AC | PRN
  Administered 2014-06-27: 500 [IU]
  Filled 2014-06-27: qty 5

## 2014-06-27 MED ORDER — SODIUM CHLORIDE 0.9 % IJ SOLN
10.0000 mL | INTRAMUSCULAR | Status: DC | PRN
  Administered 2014-06-27: 10 mL
  Filled 2014-06-27: qty 10

## 2014-06-27 MED ORDER — ACETAMINOPHEN 325 MG PO TABS
650.0000 mg | ORAL_TABLET | Freq: Once | ORAL | Status: AC
  Administered 2014-06-27: 650 mg via ORAL

## 2014-06-27 MED ORDER — SODIUM CHLORIDE 0.9 % IV SOLN
Freq: Once | INTRAVENOUS | Status: AC
  Administered 2014-06-27: 11:00:00 via INTRAVENOUS

## 2014-06-27 MED ORDER — LIDOCAINE-PRILOCAINE 2.5-2.5 % EX CREA: 1.0000 "application " | TOPICAL_CREAM | CUTANEOUS | Status: DC | PRN

## 2014-06-27 MED ORDER — DIPHENHYDRAMINE HCL 25 MG PO CAPS
25.0000 mg | ORAL_CAPSULE | Freq: Once | ORAL | Status: AC
  Administered 2014-06-27: 25 mg via ORAL

## 2014-06-27 MED ORDER — SODIUM CHLORIDE 0.9 % IV SOLN
375.0000 mg/m2 | Freq: Once | INTRAVENOUS | Status: AC
  Administered 2014-06-27: 800 mg via INTRAVENOUS
  Filled 2014-06-27: qty 80

## 2014-06-27 MED ORDER — ACETAMINOPHEN 325 MG PO TABS
ORAL_TABLET | ORAL | Status: AC
  Filled 2014-06-27: qty 2

## 2014-06-27 MED ORDER — DIPHENHYDRAMINE HCL 25 MG PO CAPS
ORAL_CAPSULE | ORAL | Status: AC
  Filled 2014-06-27: qty 1

## 2014-06-27 NOTE — Patient Instructions (Signed)
[facility] Discharge Instructions for Patients Receiving Chemotherapy  Today you received the following chemotherapy agents:  Rituxan  To help prevent nausea and vomiting after your treatment, we encourage you to take your nausea medication as ordered per MD.   If you develop nausea and vomiting that is not controlled by your nausea medication, call the clinic.   BELOW ARE SYMPTOMS THAT SHOULD BE REPORTED IMMEDIATELY:  *FEVER GREATER THAN 100.5 F  *CHILLS WITH OR WITHOUT FEVER  NAUSEA AND VOMITING THAT IS NOT CONTROLLED WITH YOUR NAUSEA MEDICATION  *UNUSUAL [name]  *UNUSUAL BRUISING OR BLEEDING  TENDERNESS IN MOUTH AND THROAT WITH OR WITHOUT PRESENCE OF ULCERS  *URINARY PROBLEMS  *BOWEL PROBLEMS  UNUSUAL RASH Items with * indicate a potential emergency and should be followed up as soon as possible.  Feel free to call the clinic you have any questions or concerns. The clinic phone number is [phone].    

## 2014-06-27 NOTE — Progress Notes (Signed)
Per Janice Norrie NP pt to continue with Magnesium 400 mg PO BID

## 2014-06-27 NOTE — Progress Notes (Signed)
ID: [name] OB: Oct 09, 1931  MR#: [id number]  WUJ#:[id number]  PCP: Horatio Pel, MD GYN:   SU: [name] OTHER MD: [name], [name],  [name], [name], Raynelle Bring  CHIEF COMPLAINT: Non-Hodgkin's lymphoma  CURRENT TREATMENT: Maintenance rituximab    NON-HODGKIN"S LYMPHOMA HISTORY: From the prior summary:  The patient developed right upper quadrant pain last year and he had an ultrasound April 5th which showed some gallstones without evidence of cholecystitis or ductal dilatation.  However, there were multiple lesions in the liver which could not be assessed further.  Accordingly on July 31, 2003, [name], showed multiple liver masses, more than 25, most over 1 cm, the largest being in the inferior right lobe, measuring 4.2 cm. There was also a small mass in the spleen.  CT of the pelvis was unremarkable.   The patient had a biopsy of the liver August 01, 2003.  The report says only that it was "a lesion in the right lobe of the liver".  Presumably this was the largest lesion present.  The final pathology [id number] and [id number]) showed only cirrhosis.     The patient has been followed by [name], and a repeat CT scan of the abdomen and pelvis was obtained May 18, 2004.  Many of the liver lesions seen previously had actually decreased in size.  However, [name], [name], a second ultrasound-guided biopsy was performed 06/11/04. This was a lesion deep in the left lobe of the liver and therefore, I would think not the same one previously biopsied which was in the right liver. The pathology  this time [id number]) shows a poorly differentiated neuroendocrine carcinoma which was positive for chromogranin A, negative for synaptophysin, thyroid transcription factor, a variety of cytokeratins, [name], alpha-fetoprotein and COX-2."  Mr. [name] was subsequently evaluated at [facility] by Dr. Otelia Limes and repeat liver biopsy and review of the earlier biopsy here showed a primary hepatic lymphoma. The patient was treated with R-CHOP as detailed below and achieved a complete response. He received maintenance rituximab until 2008  His subsequent history is as detailed below  INTERVAL HISTORY. AC returns today with one of his daughters for follow up of his Non-Hodkin's lymphoma. Today is day 1 cycle 6 of rituximab maintenance given every 8 weeks. He tolerates the drug well with no side effects that he is aware of, and has never had any reactions during or after treatment. The interval history is generally unremarkable. He is waiting for the weather to clear up so that he can get back into his garden.   As far as his lymphoma is concerned, he denies significant weight loss, significant fatigue, rash, pruritus, drenching sweats, or fevers. He is not aware of any adenopathy. He just had a restaging PET scan which is entirely stable as compared to last Monday  REVIEW OF SYSTEMS:  AC denies fevers, chills, night sweats, rash, or weight loss. He has no nausea or vomiting. His diarrhea is better on the questran powder, which he uses every 2-3 days. He visited a dietitian who questioned his dose of  magnesium. AC was started on this magnesium last year when he was critically low at 1.0. His appetite is "too good" though he admits he is probably not drinking enough water. He has not had any new suspicious skin lesions arise since the squamous cell lesion was removed from his temple in January. He continues on xarelto and denies bleeding or excessive bruising. He denies headaches or dizziness, but can be off  balance first thing in the morning. His mood is stable. A detailed review of systems is otherwise stable.  PAST MEDICAL HISTORY: Past Medical History  Diagnosis Date  . Hypertension   . Cancer of liver   . Kidney stone   . Prostate cancer   . Skin cancer   . Arthritis   . Anemia   . GERD (gastroesophageal reflux disease)   . Lymphoma   . [name]   . Sleep apnea   . Neuropathy in diabetes     Hx: of  . Diabetes mellitus     INSULIN DEPENDENT  . HX, PERSONAL, MALIGNANCY, PROSTATE 07/28/2006    Annotation: 2001, resected Qualifier: Diagnosis of  By: [name], [name]    . A-fib   . Near syncope 10/18/2013  . Memory deficit 10/18/2013  . HOH (hard of hearing)   Significant for prostate cancer, the patient undergoing prostatectomy January 2001 under [facility] for a Gleason 7, [name], minor coronary artery disease, hypertension, hypertriglyceridemia, cholelithiasis, colon polyps, cirrhosis by biopsy, diabetes, [name], history of nephrolithiasis, status post right renal surgery and history of left rotator cuff repair under [name].    PAST SURGICAL HISTORY: Past Surgical History  Procedure Laterality Date  . Prostatectomy    . Kidney stone surgery    . Rotator cuff repair    . Ankle surgery    . Cardiac catheterization  09/16/96    Normal LV systolic function,dense ca+ prox. portion of the LAD w/50% narrowing in the distal portion, 30-40% irreg. in the proximal portion & 80% narrowing in the ostial portion of the posterolateral branch.  . Colonoscopy      Hx: of  . Infusion port  04/04/2013    RIGHT SUBCLAVIAN  . Cholecystectomy  04/04/2013  . Cholecystectomy N/A 04/04/2013    Procedure: LAPAROSCOPIC CHOLECYSTECTOMY WITH INTRAOPERATIVE  CHOLANGIOGRAM;  [name], MD;  Location: [place];  Service: General;  [name];  . Portacath placement N/A 04/04/2013    Procedure: INSERTION PORT-A-CATH;  [name], MD;  Location: [place];  Service: General;  [name];    FAMILY HISTORY Family History  Problem Relation Age of Onset  . Heart disease Father   . Heart attack Father   . Cancer Sister   . Heart attack Brother   . Heart attack Brother   . Heart attack Brother   . Heart attack Brother   . Heart attack Brother   . Cancer Brother   . Cancer Brother   The patient's father died at the age of 87 from an MI.  The patient's mother died from "old age" at 37.  The patient is one of nine siblings.  One brother died from cancer of the esophagus, one sister with lymphoma and a half-brother with lung cancer.  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, [name], a homemaker, died in 2012/07/22: [name] is an Scientist, physiological for [employer]; [name], [name]; and [name] is Environmental consultant.  Everybody lives in [place].  The patient has five grandchildren.  He is a member of [place]. [place].    ADVANCED DIRECTIVES: in place   HEALTH MAINTENANCE: History  Substance Use Topics  . Smoking status: Former Smoker    Types: Pipe  . Smokeless tobacco: Former Systems developer    Types: Chew     Comment: Hope YEARS AGO "  . Alcohol Use: No     Colonoscopy:  PSA: Followed by Dr. [name]  Bone density:  Lipid panel:  Allergies  Allergen Reactions  . Niacin Other (See Comments)    headaches    Current Outpatient Prescriptions  Medication Sig Dispense Refill  . allopurinol (ZYLOPRIM) 300 MG tablet TAKE 1 TABLET (300 MG TOTAL) BY  MOUTH DAILY. 30 tablet 3  . amLODipine (NORVASC) 5 MG tablet Take 2.5 mg by mouth daily.     Marland Kitchen atorvastatin (LIPITOR) 20 MG tablet Take 20 mg by mouth at bedtime.    . cetirizine (ZYRTEC) 10 MG tablet Take 10 mg by mouth daily.    . cholecalciferol (VITAMIN D) 1000 UNITS tablet Take 1,000 Units by mouth daily.    . cholestyramine (QUESTRAN) 4 G packet Take 1 packet (4 g total) by mouth 3 (three) times daily with meals. 60 each 12  . Cyanocobalamin (VITAMIN B 12 PO) Take 2,500 mg by mouth daily.     . Ezetimibe (ZETIA PO) Take 5 mg by mouth at bedtime.    . fenofibrate 160 MG tablet Take 160 mg by mouth at bedtime.    . gabapentin (NEURONTIN) 300 MG capsule Take 300 mg by mouth 3 (three) times daily.     . insulin NPH Human (HUMULIN N,NOVOLIN N) 100 UNIT/ML injection Inject 40 Units into the skin 2 (two) times daily before a meal. 35 units in the morning and 40 units at bedtime.    . magnesium oxide (MAG-OX) 400 MG tablet Take 400 mg by mouth 2 (two) times daily.    . metFORMIN (GLUCOPHAGE) 1000 MG tablet Take 1,000 mg by mouth 2 (two) times daily with a meal.    . Multiple Vitamins-Minerals (MULTIVITAMIN WITH MINERALS) tablet Take 1 tablet by mouth daily.    . sertraline (ZOLOFT) 100 MG tablet Take 100 mg by mouth daily.    . vitamin C (ASCORBIC ACID) 500 MG tablet Take 500 mg by mouth daily.    Alveda Reasons 20 MG TABS tablet Take 20 mg by mouth daily with supper.      No current facility-administered medications for this visit.    Objective: Elderly white man who appears stated age  9 Vitals:   06/27/14 0930  BP: 138/77  Pulse: 71  Temp: 98.1 F (36.7 C)  Resp: 20     Body mass index is 30.08 kg/(m^2).      ECOG FS:1 - Symptomatic but completely ambulatory  Skin: warm, dry  HEENT: sclerae anicteric, conjunctivae pink, oropharynx clear. No thrush or mucositis.  Lymph Nodes: No cervical or supraclavicular lymphadenopathy  Lungs: clear to auscultation bilaterally, no rales,  wheezes, or rhonci  Heart: regular rate  and rhythm  Abdomen: round, soft, non tender, positive bowel sounds  Musculoskeletal: No focal spinal tenderness, no peripheral edema  Neuro: non focal, well oriented, positive affect   LAB RESULTS: Results for NATANAEL, SALADIN (MRN [id number]) as of 05/02/2014 09:19  Ref. Range 03/20/2013 09:20 04/29/2013 17:34  PSA Latest Range: <=4.00 ng/mL 8.01 (H) 7.77 (H)   CMP     Component Value Date/Time   NA 138 05/02/2014 0824   NA 137 08/19/2013 0825   K 3.8 05/02/2014 0824   K 3.7 08/19/2013 0825   CL 100 08/19/2013 0825   CO2 26 05/02/2014 0824   CO2 24 08/19/2013 0825   GLUCOSE 279* 05/02/2014 0824   GLUCOSE 207* 08/19/2013 0825   BUN 18.8 05/02/2014 0824   BUN 13 08/19/2013 0825   CREATININE 1.4* 05/02/2014 0824   CREATININE 0.92 08/19/2013 0825   CALCIUM 9.4 05/02/2014 0824   CALCIUM 9.6 08/19/2013 0825   CALCIUM 11.0* 08/17/2005 0842   PROT 6.5 05/02/2014 0824   PROT 5.9* 08/16/2013 1253   ALBUMIN 4.0 05/02/2014 0824   ALBUMIN 3.5 08/16/2013 1253   AST 31 05/02/2014 0824   AST 24 08/16/2013 1253   ALT 36 05/02/2014 0824   ALT 21 08/16/2013 1253   ALKPHOS 70 05/02/2014 0824   ALKPHOS 54 08/16/2013 1253   BILITOT 0.52 05/02/2014 0824   BILITOT 0.3 08/16/2013 1253   GFRNONAA 78* 08/19/2013 0825   GFRAA >90 08/19/2013 0825    No results found for: SPEP  Lab Results  Component Value Date   WBC 5.3 06/27/2014   NEUTROABS 4.3 06/27/2014   HGB 11.1* 06/27/2014   HCT 32.8* 06/27/2014   MCV 86.8 06/27/2014   PLT 122* 06/27/2014      Chemistry      Component Value Date/Time   NA 138 05/02/2014 0824   NA 137 08/19/2013 0825   K 3.8 05/02/2014 0824   K 3.7 08/19/2013 0825   CL 100 08/19/2013 0825   CO2 26 05/02/2014 0824   CO2 24 08/19/2013 0825   BUN 18.8 05/02/2014 0824   BUN 13 08/19/2013 0825   CREATININE 1.4* 05/02/2014 0824   CREATININE 0.92 08/19/2013 0825      Component Value Date/Time   CALCIUM 9.4 05/02/2014  0824   CALCIUM 9.6 08/19/2013 0825   CALCIUM 11.0* 08/17/2005 0842   ALKPHOS 70 05/02/2014 0824   ALKPHOS 54 08/16/2013 1253   AST 31 05/02/2014 0824   AST 24 08/16/2013 1253   ALT 36 05/02/2014 0824   ALT 21 08/16/2013 1253   BILITOT 0.52 05/02/2014 0824   BILITOT 0.3 08/16/2013 1253      Urinalysis    Component Value Date/Time   COLORURINE YELLOW 08/16/2013 1317   APPEARANCEUR CLEAR 08/16/2013 1317   LABSPEC 1.009 08/16/2013 1317   PHURINE 6.5 08/16/2013 [place] 08/16/2013 1317   HGBUR NEGATIVE 08/16/2013 1317   [place] 08/16/2013 1317   KETONESUR NEGATIVE 08/16/2013 1317   PROTEINUR NEGATIVE 08/16/2013 1317   UROBILINOGEN 0.2 08/16/2013 1317   NITRITE NEGATIVE 08/16/2013 1317   LEUKOCYTESUR NEGATIVE 08/16/2013 1317    STUDIES: No results found.   ASSESSMENT:  81 y.o. patient with a diagnosis of    #1 Primary hepatic non-Hodgkin's lymphoma established through liver biopsy February 2006, treated with Rituxan x9 and then CHOP x6.  All chemotherapy completed in 2007.  Maintenance Rituxan discontinued October 2008.     #2  Biopsy proven retroperitoneal recurrence documented November 2014. Bone  marrow biopsy 04/01/2013 was negative   #3 status post  laparoscopic cholecystectomy and port placement on 04/04/2013.  #4 Cycle #1  RICE chemotherapy  completed on 05/02/2013.   #5 cycle #2 RICE chemotherapy  on 05/17/2013  #6 PET scan performed on 05/23/2013 revealed marked partial response to chemotherapy with a retroperitoneal mass decrease in metabolic activity from SUV of 13.2 to 6.3. Right adrenal mass size and metabolic activity is resolved when compared to the previous PET scan.   #7 Evaluated at [facility] by Dr. Tomasa Hosteller for autologous transplant on 05/27/2013 and was quoted a 3% chance of significant heart damage and possibly death from the treatment and a 50% chance of being alive 5 years from now after transplant (versus perhaps 2 years  without). After much thought the patient and family have decided firmly they do not wish to proceed to stem cell transplant.  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 treated with vancomycin IV x14 days completed 07/02/2013, followed by doxycycline for 2 additional weeks  #10 started maintenance Rituxan May 2015 (1 dose every 8 weeks)  #11 A-fib, on rivaroxaban  #12 prostate cancer-- under observation  PLAN: AC looks and feels well today. The CBC and CMET were reviewed in detail and were entirely stable. I placed orders for an add on magnesium level, as he has not had one drawn since last year. I will update him and his daughter when this results return. At this time it seems he has no symptoms of active lymphoma. He will proceed with cycle 6 of rituximab as planned.   AC will return in 8 weeks for a follow up visit and his next dose of rituximab. He understands and agrees with this plan. He knows the goal of treatment in his case is control. He has been encouraged to call with any issues that might arise before his next visit here.   Marcelino Duster, NP   06/27/2014

## 2014-06-30 LAB — BETA 2 MICROGLOBULIN, SERUM: BETA 2 MICROGLOBULIN: 3.76 mg/L — AB (ref <=2.51)

## 2014-07-11 DIAGNOSIS — H25043 Posterior subcapsular polar age-related cataract, bilateral: Secondary | ICD-10-CM | POA: Diagnosis not present

## 2014-07-11 DIAGNOSIS — E109 Type 1 diabetes mellitus without complications: Secondary | ICD-10-CM | POA: Diagnosis not present

## 2014-07-11 DIAGNOSIS — H524 Presbyopia: Secondary | ICD-10-CM | POA: Diagnosis not present

## 2014-07-31 ENCOUNTER — Other Ambulatory Visit: Payer: Self-pay | Admitting: Dermatology

## 2014-07-31 DIAGNOSIS — C44229 Squamous cell carcinoma of skin of left ear and external auricular canal: Secondary | ICD-10-CM | POA: Diagnosis not present

## 2014-07-31 DIAGNOSIS — L814 Other melanin hyperpigmentation: Secondary | ICD-10-CM | POA: Diagnosis not present

## 2014-07-31 DIAGNOSIS — C44219 Basal cell carcinoma of skin of left ear and external auricular canal: Secondary | ICD-10-CM | POA: Diagnosis not present

## 2014-07-31 DIAGNOSIS — L821 Other seborrheic keratosis: Secondary | ICD-10-CM | POA: Diagnosis not present

## 2014-07-31 DIAGNOSIS — L570 Actinic keratosis: Secondary | ICD-10-CM | POA: Diagnosis not present

## 2014-07-31 DIAGNOSIS — Z85828 Personal history of other malignant neoplasm of skin: Secondary | ICD-10-CM | POA: Diagnosis not present

## 2014-07-31 DIAGNOSIS — D485 Neoplasm of uncertain behavior of skin: Secondary | ICD-10-CM | POA: Diagnosis not present

## 2014-08-11 DIAGNOSIS — H2511 Age-related nuclear cataract, right eye: Secondary | ICD-10-CM | POA: Diagnosis not present

## 2014-08-11 DIAGNOSIS — H25041 Posterior subcapsular polar age-related cataract, right eye: Secondary | ICD-10-CM | POA: Diagnosis not present

## 2014-08-11 DIAGNOSIS — H2512 Age-related nuclear cataract, left eye: Secondary | ICD-10-CM | POA: Diagnosis not present

## 2014-08-11 DIAGNOSIS — H25011 Cortical age-related cataract, right eye: Secondary | ICD-10-CM | POA: Diagnosis not present

## 2014-08-18 ENCOUNTER — Telehealth: Payer: Self-pay | Admitting: Oncology

## 2014-08-18 NOTE — Telephone Encounter (Signed)
[name] left a vm wanting to sch a lab appt-cld [phone] and left message to adv of appt times & date adv of lab/MD/infus

## 2014-08-19 ENCOUNTER — Telehealth: Payer: Self-pay | Admitting: Oncology

## 2014-08-19 NOTE — Telephone Encounter (Signed)
pt daughter [name] cld to add flush to appt-adv lft message 4/25-gave pt time of appts for 4/29

## 2014-08-22 ENCOUNTER — Ambulatory Visit (HOSPITAL_BASED_OUTPATIENT_CLINIC_OR_DEPARTMENT_OTHER): Payer: Medicare Other | Admitting: Oncology

## 2014-08-22 ENCOUNTER — Other Ambulatory Visit (HOSPITAL_BASED_OUTPATIENT_CLINIC_OR_DEPARTMENT_OTHER): Payer: Medicare Other

## 2014-08-22 ENCOUNTER — Ambulatory Visit (HOSPITAL_BASED_OUTPATIENT_CLINIC_OR_DEPARTMENT_OTHER): Payer: Medicare Other

## 2014-08-22 ENCOUNTER — Telehealth: Payer: Self-pay | Admitting: Oncology

## 2014-08-22 ENCOUNTER — Ambulatory Visit: Payer: Medicare Other

## 2014-08-22 ENCOUNTER — Other Ambulatory Visit: Payer: BLUE CROSS/BLUE SHIELD

## 2014-08-22 VITALS — BP 128/47 | HR 66 | Temp 98.1°F | Resp 16

## 2014-08-22 VITALS — BP 131/63 | HR 68 | Temp 98.1°F | Resp 18 | Ht 67.0 in | Wt 187.7 lb

## 2014-08-22 DIAGNOSIS — C8599 Non-Hodgkin lymphoma, unspecified, extranodal and solid organ sites: Secondary | ICD-10-CM | POA: Diagnosis not present

## 2014-08-22 DIAGNOSIS — Z7984 Long term (current) use of oral hypoglycemic drugs: Principal | ICD-10-CM

## 2014-08-22 DIAGNOSIS — Z5112 Encounter for antineoplastic immunotherapy: Secondary | ICD-10-CM | POA: Diagnosis present

## 2014-08-22 DIAGNOSIS — Z87898 Personal history of other specified conditions: Secondary | ICD-10-CM

## 2014-08-22 DIAGNOSIS — C8580 Other specified types of non-Hodgkin lymphoma, unspecified site: Secondary | ICD-10-CM

## 2014-08-22 DIAGNOSIS — Z8546 Personal history of malignant neoplasm of prostate: Secondary | ICD-10-CM | POA: Diagnosis not present

## 2014-08-22 DIAGNOSIS — C61 Malignant neoplasm of prostate: Secondary | ICD-10-CM

## 2014-08-22 DIAGNOSIS — C8590 Non-Hodgkin lymphoma, unspecified, unspecified site: Secondary | ICD-10-CM

## 2014-08-22 DIAGNOSIS — Z95828 Presence of other vascular implants and grafts: Secondary | ICD-10-CM

## 2014-08-22 DIAGNOSIS — I4891 Unspecified atrial fibrillation: Secondary | ICD-10-CM

## 2014-08-22 DIAGNOSIS — E119 Type 2 diabetes mellitus without complications: Secondary | ICD-10-CM

## 2014-08-22 LAB — COMPREHENSIVE METABOLIC PANEL (CC13)
ALT: 20 U/L (ref 0–55)
ANION GAP: 11 meq/L (ref 3–11)
AST: 18 U/L (ref 5–34)
Albumin: 3.7 g/dL (ref 3.5–5.0)
Alkaline Phosphatase: 65 U/L (ref 40–150)
BUN: 19.7 mg/dL (ref 7.0–26.0)
CO2: 24 meq/L (ref 22–29)
CREATININE: 1.4 mg/dL — AB (ref 0.7–1.3)
Calcium: 9.4 mg/dL (ref 8.4–10.4)
Chloride: 105 mEq/L (ref 98–109)
EGFR: 45 mL/min/{1.73_m2} — ABNORMAL LOW (ref >90)
Glucose: 200 mg/dl — ABNORMAL HIGH (ref 70–140)
POTASSIUM: 3.8 meq/L (ref 3.5–5.1)
Sodium: 140 mEq/L (ref 136–145)
Total Bilirubin: 0.41 mg/dL (ref 0.20–1.20)
Total Protein: 6.3 g/dL — ABNORMAL LOW (ref 6.4–8.3)

## 2014-08-22 LAB — CBC WITH DIFFERENTIAL/PLATELET
BASO%: 0.3 % (ref 0.0–2.0)
Basophils Absolute: 0 10*3/uL (ref 0.0–0.1)
EOS%: 2 % (ref 0.0–7.0)
Eosinophils Absolute: 0.1 10*3/uL (ref 0.0–0.5)
HCT: 31.2 % — ABNORMAL LOW (ref 38.4–49.9)
HEMOGLOBIN: 10.5 g/dL — AB (ref 13.0–17.1)
LYMPH%: 2.5 % — ABNORMAL LOW (ref 14.0–49.0)
MCH: 28.8 pg (ref 27.2–33.4)
MCHC: 33.5 g/dL (ref 32.0–36.0)
MCV: 86 fL (ref 79.3–98.0)
MONO#: 0.6 10*3/uL (ref 0.1–0.9)
MONO%: 9.4 % (ref 0.0–14.0)
NEUT%: 85.8 % — ABNORMAL HIGH (ref 39.0–75.0)
NEUTROS ABS: 5.9 10*3/uL (ref 1.5–6.5)
PLATELETS: 121 10*3/uL — AB (ref 140–400)
RBC: 3.63 10*6/uL — ABNORMAL LOW (ref 4.20–5.82)
RDW: 15.5 % — ABNORMAL HIGH (ref 11.0–14.6)
WBC: 6.9 10*3/uL (ref 4.0–10.3)
lymph#: 0.2 10*3/uL — ABNORMAL LOW (ref 0.9–3.3)

## 2014-08-22 LAB — LACTATE DEHYDROGENASE (CC13): LDH: 156 U/L (ref 125–245)

## 2014-08-22 MED ORDER — ACETAMINOPHEN 325 MG PO TABS
ORAL_TABLET | ORAL | Status: AC
  Filled 2014-08-22: qty 2

## 2014-08-22 MED ORDER — HEPARIN SOD (PORK) LOCK FLUSH 100 UNIT/ML IV SOLN
500.0000 [IU] | Freq: Once | INTRAVENOUS | Status: AC | PRN
  Administered 2014-08-22: 500 [IU]
  Filled 2014-08-22: qty 5

## 2014-08-22 MED ORDER — SODIUM CHLORIDE 0.9 % IV SOLN
Freq: Once | INTRAVENOUS | Status: AC
  Administered 2014-08-22: 09:00:00 via INTRAVENOUS

## 2014-08-22 MED ORDER — RITUXIMAB CHEMO INJECTION 500 MG/50ML
375.0000 mg/m2 | Freq: Once | INTRAVENOUS | Status: AC
  Administered 2014-08-22: 800 mg via INTRAVENOUS
  Filled 2014-08-22: qty 80

## 2014-08-22 MED ORDER — ACETAMINOPHEN 325 MG PO TABS
650.0000 mg | ORAL_TABLET | Freq: Once | ORAL | Status: AC
Start: 2014-08-22 — End: 2014-08-22
  Administered 2014-08-22: 650 mg via ORAL

## 2014-08-22 MED ORDER — SODIUM CHLORIDE 0.9 % IJ SOLN
10.0000 mL | INTRAMUSCULAR | Status: DC | PRN
  Administered 2014-08-22: 10 mL
  Filled 2014-08-22: qty 10

## 2014-08-22 MED ORDER — DIPHENHYDRAMINE HCL 25 MG PO CAPS
ORAL_CAPSULE | ORAL | Status: AC
  Filled 2014-08-22: qty 1

## 2014-08-22 MED ORDER — SODIUM CHLORIDE 0.9 % IJ SOLN
10.0000 mL | INTRAMUSCULAR | Status: DC | PRN
  Administered 2014-08-22: 10 mL via INTRAVENOUS
  Filled 2014-08-22: qty 10

## 2014-08-22 MED ORDER — DIPHENHYDRAMINE HCL 25 MG PO CAPS
25.0000 mg | ORAL_CAPSULE | Freq: Once | ORAL | Status: AC
  Administered 2014-08-22: 25 mg via ORAL

## 2014-08-22 NOTE — Patient Instructions (Signed)

## 2014-08-22 NOTE — Telephone Encounter (Signed)
per pof to sch pt appt-gave pt copy of sch-sent email to MW to sch trmt and move 6/24-pt hasY CHART and will review updated sch

## 2014-08-22 NOTE — Progress Notes (Signed)
ID: [name] OB: September 30, 1931  MR#: [id number]  OZY#:[id number]  PCP: Michael Pel, MD GYN:   SU: [name] OTHER MD: [name], [name],  [name], [name], [name]  CHIEF COMPLAINT: Non-Hodgkin's lymphoma  CURRENT TREATMENT: Maintenance rituximab    NON-HODGKIN"S LYMPHOMA HISTORY: From the prior summary:  The patient developed right upper quadrant pain last year and he had an ultrasound April 5th which showed some gallstones without evidence of cholecystitis or ductal dilatation.  However, there were multiple lesions in the liver which could not be assessed further.  Accordingly on July 31, 2003, [name], showed multiple liver masses, more than 25, most over 1 cm, the largest being in the inferior right lobe, measuring 4.2 cm. There was also a small mass in the spleen.  CT of the pelvis was unremarkable.   The patient had a biopsy of the liver August 01, 2003.  The report says only that it was "a lesion in the right lobe of the liver".  Presumably this was the largest lesion present.  The final pathology [id number] and [id number]) showed only cirrhosis.     The patient has been followed by [name], and a repeat CT scan of the abdomen and pelvis was obtained May 18, 2004.  Many of the liver lesions seen previously had actually decreased in size.  However, [name], [name], a second ultrasound-guided biopsy was performed 06/11/04. This was a lesion deep in the left lobe of the liver and therefore, I would think not the same one previously biopsied which was in the right  liver. The pathology this time [id number]) shows a poorly differentiated neuroendocrine carcinoma which was positive for chromogranin A, negative for synaptophysin, thyroid transcription factor, a variety of cytokeratins, [name], alpha-fetoprotein and COX-2."  [name] was subsequently evaluated at [facility] by [name] and repeat liver biopsy and review of the earlier biopsy here showed a primary hepatic lymphoma. The patient was treated with R-CHOP as detailed below and achieved a complete response. He received maintenance rituximab until 2008  His subsequent history is as detailed below  INTERVAL HISTORY. [name] returns today follow up of his Non-Hodkin's lymphoma accompanied by his daughter [name] (the other daughter, [name], could not come today). Today is day 1 cycle 7 of rituximab maintenance given every 8 weeks. [name] reports no side effects from the rituximab. His port is working well. He uses the embolectomy cream appropriately  REVIEW OF SYSTEMS:  He denies fevers, chills, night sweats, rash, or weight loss. He has been doing a lot of hoeing, planting tomatoes radishes let us [employer] core. This gave him a a backache, which she is treating with Tylenol. There have been no unusual headaches, nausea, vomiting, dizziness, or gait imbalance. He has significant cataracts and these are going to be removed in May under Dr. Lucita Ferrara. He denies cough, phlegm production, pleurisy, chest pain or pressure, or change in bowel or bladder habits. A detailed review of systems today was otherwise noncontributory  PAST MEDICAL HISTORY: Past Medical History  Diagnosis Date  .  Hypertension   . Cancer of liver   . Kidney stone   . Prostate cancer   . Skin cancer   . Arthritis   . Anemia   . GERD (gastroesophageal reflux disease)   . Lymphoma   . [name]   . Sleep apnea   . Neuropathy in diabetes     Hx: of  . Diabetes mellitus     INSULIN DEPENDENT  . HX, PERSONAL, MALIGNANCY, PROSTATE  07/28/2006    Annotation: 06/30/1999, resected Qualifier: Diagnosis of  By: [name], [name]    . A-fib   . Near syncope 10/18/2013  . Memory deficit 10/18/2013  . HOH (hard of hearing)   Significant for prostate cancer, the patient undergoing prostatectomy January 2001 under [facility] for a Gleason 7, pathologic T3b (positive seminal vesicle involvement) adenocarcinoma with 0 of 2 lymph nodes involved.  Current PSA is followed by Dr. [name]. Other medical problems include sleep apnea, minor coronary artery disease, hypertension, hypertriglyceridemia, cholelithiasis, colon polyps, cirrhosis by biopsy, diabetes, [name], history of nephrolithiasis, status post right renal surgery and history of left rotator cuff repair under [name].    PAST SURGICAL HISTORY: Past Surgical History  Procedure Laterality Date  . Prostatectomy    . Kidney stone surgery    . Rotator cuff repair    . Ankle surgery    . Cardiac catheterization  09/16/96    Normal LV systolic function,dense ca+ prox. portion of the LAD w/50% narrowing in the distal portion, 30-40% irreg. in the proximal portion & 80% narrowing in the ostial portion of the posterolateral branch.  . Colonoscopy      Hx: of  . Infusion port  04/04/2013    RIGHT SUBCLAVIAN  . Cholecystectomy  04/04/2013  . Cholecystectomy N/A 04/04/2013    Procedure: LAPAROSCOPIC CHOLECYSTECTOMY WITH INTRAOPERATIVE CHOLANGIOGRAM;  [name], MD;  Location: [facility];  Service: General;  [name];  . Portacath placement N/A 04/04/2013    Procedure: INSERTION PORT-A-CATH;  [name], MD;  Location: [place];  Service: General;  [name];    FAMILY HISTORY Family History  Problem Relation Age of Onset  . Heart disease Father   . Heart attack Father   . Cancer Sister   . Heart attack Brother   . Heart attack Brother   . Heart attack Brother   . Heart attack Brother    . Heart attack Brother   . Cancer Brother   . Cancer Brother   The patient's father died at the age of 78 from an MI.  The patient's mother died from "old age" at 73.  The patient is one of nine siblings.  One brother died from cancer of the esophagus, 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, [name], a homemaker, died in June 29, 2012: [name] is an Scientist, physiological for [employer]; [name], [name]; and [name] is Environmental consultant.  Everybody lives in [place].  The patient has five grandchildren.  He is a member of [place]. [place].    ADVANCED DIRECTIVES: in place   HEALTH MAINTENANCE: History  Substance Use Topics  . Smoking status: Former Smoker    Types: Pipe  . Smokeless tobacco: Former Systems developer    Types: Chew     Comment: Century YEARS AGO "  . Alcohol Use: No     Colonoscopy:  PSA: Followed by Dr. [name]  Bone density:  Lipid panel:  Allergies  Allergen Reactions  . Niacin Other (See Comments)    headaches    Current Outpatient Prescriptions  Medication Sig Dispense Refill  . allopurinol (ZYLOPRIM) 300 MG tablet TAKE 1 TABLET (300 MG TOTAL) BY MOUTH DAILY. 30 tablet 3  . amLODipine (NORVASC) 5 MG tablet Take 2.5 mg by mouth daily.     Marland Kitchen atorvastatin (LIPITOR) 20 MG tablet Take 20 mg by mouth at bedtime.    . cetirizine (ZYRTEC) 10 MG tablet Take 10 mg by mouth daily.    . cholecalciferol (VITAMIN D) 1000 UNITS tablet Take 1,000 Units by mouth daily.    . cholestyramine (QUESTRAN) 4 G packet Take 1 packet (4 g total) by mouth 3 (three) times daily with meals. 60 each 12  . Cyanocobalamin (VITAMIN B 12 PO) Take 2,500 mg by mouth daily.      . Ezetimibe (ZETIA PO) Take 5 mg by mouth at bedtime.    . fenofibrate 160 MG tablet Take 160 mg by mouth at bedtime.    . gabapentin (NEURONTIN) 300 MG capsule Take 300 mg by mouth 3 (three) times daily.     . insulin NPH Human (HUMULIN N,NOVOLIN N) 100 UNIT/ML injection Inject 40 Units into the skin 2 (two) times daily before a meal. 35 units in the morning and 40 units at bedtime.    . lidocaine-prilocaine (EMLA) cream Apply 1 application topically as needed. 30 g 3  . magnesium oxide (MAG-OX) 400 MG tablet Take 400 mg by mouth 2 (two) times daily.    . metFORMIN (GLUCOPHAGE) 1000 MG tablet Take 1,000 mg by mouth 2 (two) times daily with a meal.    . Multiple Vitamins-Minerals (MULTIVITAMIN WITH MINERALS) tablet Take 1 tablet by mouth daily.    . sertraline (ZOLOFT) 100 MG tablet Take 100 mg by mouth daily.    . vitamin C (ASCORBIC ACID) 500 MG tablet Take 500 mg by mouth daily.    Alveda Reasons 20 MG TABS tablet Take 20 mg by mouth daily with supper.      No current facility-administered medications for this visit.    Objective: Elderly white man in no acute distress  Filed Vitals:   08/22/14 0845  BP: 131/63  Pulse: 68  Temp: 98.1 F (36.7 C)  Resp: 18     Body mass index is 29.39 kg/(m^2).      ECOG FS:0 - Asymptomatic  Sclerae unicteric, EOMs intact Oropharynx clear, dentition in fair repair No cervical or supraclavicular adenopathy, no axillary or inguinal adenopathy Lungs no rales or rhonchi Heart regular rate and rhythm Abd soft, nontender, positive bowel sounds, no palpable splenomegaly MSK no focal spinal tenderness, no joint edema Neuro: nonfocal, well oriented, positive affect    LAB RESULTS:  CMP     Component Value Date/Time   NA 140 06/27/2014 0919   NA 137 08/19/2013 0825   K 3.6 06/27/2014 0919   K 3.7 08/19/2013 0825   CL 100 08/19/2013 0825   CO2 25 06/27/2014 0919   CO2 24 08/19/2013 0825   GLUCOSE 201* 06/27/2014 0919   GLUCOSE 207* 08/19/2013  0825   BUN 19.7 06/27/2014  0919   BUN 13 08/19/2013 0825   CREATININE 1.3 06/27/2014 0919   CREATININE 0.92 08/19/2013 0825   CALCIUM 9.7 06/27/2014 0919   CALCIUM 9.6 08/19/2013 0825   CALCIUM 11.0* 08/17/2005 0842   PROT 6.4 06/27/2014 0919   PROT 5.9* 08/16/2013 1253   ALBUMIN 3.9 06/27/2014 0919   ALBUMIN 3.5 08/16/2013 1253   AST 35* 06/27/2014 0919   AST 24 08/16/2013 1253   ALT 32 06/27/2014 0919   ALT 21 08/16/2013 1253   ALKPHOS 62 06/27/2014 0919   ALKPHOS 54 08/16/2013 1253   BILITOT 0.47 06/27/2014 0919   BILITOT 0.3 08/16/2013 1253   GFRNONAA 78* 08/19/2013 0825   GFRAA >90 08/19/2013 0825    No results found for: SPEP  Lab Results  Component Value Date   WBC 6.9 08/22/2014   NEUTROABS 5.9 08/22/2014   HGB 10.5* 08/22/2014   HCT 31.2* 08/22/2014   MCV 86.0 08/22/2014   PLT 121* 08/22/2014      Chemistry      Component Value Date/Time   NA 140 06/27/2014 0919   NA 137 08/19/2013 0825   K 3.6 06/27/2014 0919   K 3.7 08/19/2013 0825   CL 100 08/19/2013 0825   CO2 25 06/27/2014 0919   CO2 24 08/19/2013 0825   BUN 19.7 06/27/2014 0919   BUN 13 08/19/2013 0825   CREATININE 1.3 06/27/2014 0919   CREATININE 0.92 08/19/2013 0825      Component Value Date/Time   CALCIUM 9.7 06/27/2014 0919   CALCIUM 9.6 08/19/2013 0825   CALCIUM 11.0* 08/17/2005 0842   ALKPHOS 62 06/27/2014 0919   ALKPHOS 54 08/16/2013 1253   AST 35* 06/27/2014 0919   AST 24 08/16/2013 1253   ALT 32 06/27/2014 0919   ALT 21 08/16/2013 1253   BILITOT 0.47 06/27/2014 0919   BILITOT 0.3 08/16/2013 1253      Urinalysis    Component Value Date/Time   COLORURINE YELLOW 08/16/2013 1317   APPEARANCEUR CLEAR 08/16/2013 1317   LABSPEC 1.009 08/16/2013 1317   PHURINE 6.5 08/16/2013 [place] 08/16/2013 1317   HGBUR NEGATIVE 08/16/2013 1317   BILIRUBINUR NEGATIVE 08/16/2013 1317   KETONESUR NEGATIVE 08/16/2013 1317   PROTEINUR NEGATIVE 08/16/2013 1317   UROBILINOGEN  0.2 08/16/2013 1317   NITRITE NEGATIVE 08/16/2013 1317   LEUKOCYTESUR NEGATIVE 08/16/2013 1317    STUDIES: No results found.   ASSESSMENT:  81 y.o. patient with a diagnosis of    #1 Primary hepatic non-Hodgkin's lymphoma established through liver biopsy February 2006, treated with Rituxan x9 and then CHOP x6.  All chemotherapy completed in 2007.  Maintenance Rituxan discontinued October 2008.     #2  Biopsy proven retroperitoneal recurrence documented November 2014. Bone marrow biopsy 04/01/2013 was negative   #3 status post  laparoscopic cholecystectomy and port placement on 04/04/2013.  #4 Cycle #1  RICE chemotherapy  completed on 05/02/2013.   #5 cycle #2 RICE chemotherapy  on 05/17/2013  #6 PET scan performed on 05/23/2013 revealed marked partial response to chemotherapy with a retroperitoneal mass decrease in metabolic activity from SUV of 13.2 to 6.3. Right adrenal mass size and metabolic activity is resolved when compared to the previous PET scan.   #7 Evaluated at [facility] by Dr. Tomasa Hosteller for autologous transplant on 05/27/2013 and was quoted a 3% chance of significant heart damage and possibly death from the treatment and a 50% chance of being alive 5 years from now after transplant (versus perhaps 2 years  without). After much thought the patient and family have decided firmly they do not wish to proceed to stem cell transplant.  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 treated with vancomycin IV x14 days completed 07/02/2013, followed by doxycycline for 2 additional weeks  #10 started maintenance Rituxan May 2015 (1 dose every 8 weeks)  #11 A-fib, on rivaroxaban  #12 prostate cancer-- under observation  PLAN: There are no symptoms or signs suggestive of progressive or recurrent lymphoma. We're going to continue the every 2 months rituximab indefinitely, so long as he is stable. His prostate cancer is  followed through Dr. [name].  Before meals has a good understanding of the overall plan. He agrees with it. He knows the goal of treatment in his case is control. He will call with any problems that may develop before his next visit here, which will be in 2 months. [name], MD   08/22/2014

## 2014-08-22 NOTE — Patient Instructions (Signed)
[facility] Discharge Instructions for Patients Receiving Chemotherapy  Today you received the following chemotherapy agents: Rituxan  To help prevent nausea and vomiting after your treatment, we encourage you to take your nausea medication as prescribed by your physician.     If you develop nausea and vomiting that is not controlled by your nausea medication, call the clinic.   BELOW ARE SYMPTOMS THAT SHOULD BE REPORTED IMMEDIATELY:  *FEVER GREATER THAN 100.5 F  *CHILLS WITH OR WITHOUT FEVER  NAUSEA AND VOMITING THAT IS NOT CONTROLLED WITH YOUR NAUSEA MEDICATION  *UNUSUAL [name]  *UNUSUAL BRUISING OR BLEEDING  TENDERNESS IN MOUTH AND THROAT WITH OR WITHOUT PRESENCE OF ULCERS  *URINARY PROBLEMS  *BOWEL PROBLEMS  UNUSUAL RASH Items with * indicate a potential emergency and should be followed up as soon as possible.  Feel free to call the clinic you have any questions or concerns. The clinic phone number is [phone].  Please show the [place] at check-in to the Emergency Department and triage nurse.

## 2014-08-25 LAB — BETA 2 MICROGLOBULIN, SERUM: Beta-2 Microglobulin: 3.81 mg/L — ABNORMAL HIGH (ref <=2.51)

## 2014-09-01 DIAGNOSIS — C44219 Basal cell carcinoma of skin of left ear and external auricular canal: Secondary | ICD-10-CM | POA: Diagnosis not present

## 2014-09-01 DIAGNOSIS — Z85828 Personal history of other malignant neoplasm of skin: Secondary | ICD-10-CM | POA: Diagnosis not present

## 2014-09-05 DIAGNOSIS — E1121 Type 2 diabetes mellitus with diabetic nephropathy: Secondary | ICD-10-CM | POA: Diagnosis not present

## 2014-09-05 DIAGNOSIS — L570 Actinic keratosis: Secondary | ICD-10-CM | POA: Diagnosis not present

## 2014-09-05 DIAGNOSIS — E119 Type 2 diabetes mellitus without complications: Secondary | ICD-10-CM | POA: Diagnosis not present

## 2014-09-05 DIAGNOSIS — Z0000 Encounter for general adult medical examination without abnormal findings: Secondary | ICD-10-CM | POA: Diagnosis not present

## 2014-09-05 DIAGNOSIS — Z125 Encounter for screening for malignant neoplasm of prostate: Secondary | ICD-10-CM | POA: Diagnosis not present

## 2014-09-05 DIAGNOSIS — E785 Hyperlipidemia, unspecified: Secondary | ICD-10-CM | POA: Diagnosis not present

## 2014-09-09 DIAGNOSIS — Z961 Presence of intraocular lens: Secondary | ICD-10-CM | POA: Diagnosis not present

## 2014-09-09 DIAGNOSIS — H2512 Age-related nuclear cataract, left eye: Secondary | ICD-10-CM | POA: Diagnosis not present

## 2014-09-10 DIAGNOSIS — H25011 Cortical age-related cataract, right eye: Secondary | ICD-10-CM | POA: Diagnosis not present

## 2014-09-10 DIAGNOSIS — H2511 Age-related nuclear cataract, right eye: Secondary | ICD-10-CM | POA: Diagnosis not present

## 2014-09-10 DIAGNOSIS — H25041 Posterior subcapsular polar age-related cataract, right eye: Secondary | ICD-10-CM | POA: Diagnosis not present

## 2014-09-16 DIAGNOSIS — H2511 Age-related nuclear cataract, right eye: Secondary | ICD-10-CM | POA: Diagnosis not present

## 2014-09-16 DIAGNOSIS — Z961 Presence of intraocular lens: Secondary | ICD-10-CM | POA: Diagnosis not present

## 2014-09-23 DIAGNOSIS — E119 Type 2 diabetes mellitus without complications: Secondary | ICD-10-CM | POA: Diagnosis not present

## 2014-09-23 DIAGNOSIS — I4891 Unspecified atrial fibrillation: Secondary | ICD-10-CM | POA: Diagnosis not present

## 2014-09-23 DIAGNOSIS — I10 Essential (primary) hypertension: Secondary | ICD-10-CM | POA: Diagnosis not present

## 2014-09-23 DIAGNOSIS — E785 Hyperlipidemia, unspecified: Secondary | ICD-10-CM | POA: Diagnosis not present

## 2014-10-01 DIAGNOSIS — N1330 Unspecified hydronephrosis: Secondary | ICD-10-CM | POA: Diagnosis not present

## 2014-10-01 DIAGNOSIS — C8590 Non-Hodgkin lymphoma, unspecified, unspecified site: Secondary | ICD-10-CM | POA: Diagnosis not present

## 2014-10-01 DIAGNOSIS — C61 Malignant neoplasm of prostate: Secondary | ICD-10-CM | POA: Diagnosis not present

## 2014-10-03 DIAGNOSIS — E119 Type 2 diabetes mellitus without complications: Secondary | ICD-10-CM | POA: Diagnosis not present

## 2014-10-03 DIAGNOSIS — F329 Major depressive disorder, single episode, unspecified: Secondary | ICD-10-CM | POA: Diagnosis not present

## 2014-10-03 DIAGNOSIS — I10 Essential (primary) hypertension: Secondary | ICD-10-CM | POA: Diagnosis not present

## 2014-10-03 DIAGNOSIS — E785 Hyperlipidemia, unspecified: Secondary | ICD-10-CM | POA: Diagnosis not present

## 2014-10-03 DIAGNOSIS — L570 Actinic keratosis: Secondary | ICD-10-CM | POA: Diagnosis not present

## 2014-10-05 ENCOUNTER — Other Ambulatory Visit: Payer: Self-pay | Admitting: Oncology

## 2014-10-16 ENCOUNTER — Other Ambulatory Visit: Payer: Self-pay

## 2014-10-16 DIAGNOSIS — C8580 Other specified types of non-Hodgkin lymphoma, unspecified site: Secondary | ICD-10-CM

## 2014-10-16 DIAGNOSIS — C8590 Non-Hodgkin lymphoma, unspecified, unspecified site: Secondary | ICD-10-CM

## 2014-10-17 ENCOUNTER — Ambulatory Visit: Payer: Medicare Other

## 2014-10-17 ENCOUNTER — Encounter: Payer: Self-pay | Admitting: Nurse Practitioner

## 2014-10-17 ENCOUNTER — Other Ambulatory Visit (HOSPITAL_BASED_OUTPATIENT_CLINIC_OR_DEPARTMENT_OTHER): Payer: Medicare Other

## 2014-10-17 ENCOUNTER — Ambulatory Visit (HOSPITAL_BASED_OUTPATIENT_CLINIC_OR_DEPARTMENT_OTHER): Payer: Medicare Other

## 2014-10-17 ENCOUNTER — Ambulatory Visit (HOSPITAL_BASED_OUTPATIENT_CLINIC_OR_DEPARTMENT_OTHER): Payer: Medicare Other | Admitting: Nurse Practitioner

## 2014-10-17 ENCOUNTER — Other Ambulatory Visit: Payer: BLUE CROSS/BLUE SHIELD

## 2014-10-17 ENCOUNTER — Ambulatory Visit: Payer: BLUE CROSS/BLUE SHIELD

## 2014-10-17 VITALS — BP 129/60 | HR 67 | Temp 98.1°F | Resp 18

## 2014-10-17 DIAGNOSIS — C8599 Non-Hodgkin lymphoma, unspecified, extranodal and solid organ sites: Secondary | ICD-10-CM

## 2014-10-17 DIAGNOSIS — Z5112 Encounter for antineoplastic immunotherapy: Secondary | ICD-10-CM

## 2014-10-17 DIAGNOSIS — C8590 Non-Hodgkin lymphoma, unspecified, unspecified site: Secondary | ICD-10-CM

## 2014-10-17 DIAGNOSIS — C8580 Other specified types of non-Hodgkin lymphoma, unspecified site: Secondary | ICD-10-CM

## 2014-10-17 DIAGNOSIS — Z95828 Presence of other vascular implants and grafts: Secondary | ICD-10-CM

## 2014-10-17 DIAGNOSIS — C61 Malignant neoplasm of prostate: Secondary | ICD-10-CM | POA: Diagnosis not present

## 2014-10-17 LAB — CBC WITH DIFFERENTIAL/PLATELET
BASO%: 0.2 % (ref 0.0–2.0)
BASOS ABS: 0 10*3/uL (ref 0.0–0.1)
EOS%: 5.1 % (ref 0.0–7.0)
Eosinophils Absolute: 0.3 10*3/uL (ref 0.0–0.5)
HCT: 32.4 % — ABNORMAL LOW (ref 38.4–49.9)
HGB: 11 g/dL — ABNORMAL LOW (ref 13.0–17.1)
LYMPH%: 6.7 % — ABNORMAL LOW (ref 14.0–49.0)
MCH: 29.3 pg (ref 27.2–33.4)
MCHC: 34 g/dL (ref 32.0–36.0)
MCV: 86.2 fL (ref 79.3–98.0)
MONO#: 0.3 10*3/uL (ref 0.1–0.9)
MONO%: 6.1 % (ref 0.0–14.0)
NEUT#: 4.3 10*3/uL (ref 1.5–6.5)
NEUT%: 81.9 % — ABNORMAL HIGH (ref 39.0–75.0)
Platelets: 139 10*3/uL — ABNORMAL LOW (ref 140–400)
RBC: 3.76 10*6/uL — ABNORMAL LOW (ref 4.20–5.82)
RDW: 15.5 % — AB (ref 11.0–14.6)
WBC: 5.3 10*3/uL (ref 4.0–10.3)
lymph#: 0.4 10*3/uL — ABNORMAL LOW (ref 0.9–3.3)

## 2014-10-17 LAB — COMPREHENSIVE METABOLIC PANEL (CC13)
ALBUMIN: 4 g/dL (ref 3.5–5.0)
ALT: 24 U/L (ref 0–55)
AST: 25 U/L (ref 5–34)
Alkaline Phosphatase: 58 U/L (ref 40–150)
Anion Gap: 10 mEq/L (ref 3–11)
BUN: 22.1 mg/dL (ref 7.0–26.0)
CALCIUM: 9.3 mg/dL (ref 8.4–10.4)
CHLORIDE: 105 meq/L (ref 98–109)
CO2: 24 meq/L (ref 22–29)
CREATININE: 1.6 mg/dL — AB (ref 0.7–1.3)
EGFR: 40 mL/min/{1.73_m2} — ABNORMAL LOW (ref >90)
Glucose: 180 mg/dl — ABNORMAL HIGH (ref 70–140)
POTASSIUM: 4 meq/L (ref 3.5–5.1)
Sodium: 138 mEq/L (ref 136–145)
TOTAL PROTEIN: 6.5 g/dL (ref 6.4–8.3)
Total Bilirubin: 0.48 mg/dL (ref 0.20–1.20)

## 2014-10-17 MED ORDER — SODIUM CHLORIDE 0.9 % IV SOLN
Freq: Once | INTRAVENOUS | Status: AC
  Administered 2014-10-17: 10:00:00 via INTRAVENOUS

## 2014-10-17 MED ORDER — DIPHENHYDRAMINE HCL 25 MG PO CAPS
25.0000 mg | ORAL_CAPSULE | Freq: Once | ORAL | Status: AC
  Administered 2014-10-17: 25 mg via ORAL

## 2014-10-17 MED ORDER — HEPARIN SOD (PORK) LOCK FLUSH 100 UNIT/ML IV SOLN
500.0000 [IU] | Freq: Once | INTRAVENOUS | Status: AC | PRN
  Administered 2014-10-17: 500 [IU]
  Filled 2014-10-17: qty 5

## 2014-10-17 MED ORDER — SODIUM CHLORIDE 0.9 % IJ SOLN
10.0000 mL | INTRAMUSCULAR | Status: DC | PRN
  Administered 2014-10-17: 10 mL
  Filled 2014-10-17: qty 10

## 2014-10-17 MED ORDER — DIPHENHYDRAMINE HCL 25 MG PO CAPS
ORAL_CAPSULE | ORAL | Status: AC
  Filled 2014-10-17: qty 1

## 2014-10-17 MED ORDER — SODIUM CHLORIDE 0.9 % IJ SOLN
10.0000 mL | INTRAMUSCULAR | Status: DC | PRN
  Administered 2014-10-17: 10 mL via INTRAVENOUS
  Filled 2014-10-17: qty 10

## 2014-10-17 MED ORDER — SODIUM CHLORIDE 0.9 % IV SOLN
375.0000 mg/m2 | Freq: Once | INTRAVENOUS | Status: AC
  Administered 2014-10-17: 800 mg via INTRAVENOUS
  Filled 2014-10-17: qty 80

## 2014-10-17 MED ORDER — ACETAMINOPHEN 325 MG PO TABS
650.0000 mg | ORAL_TABLET | Freq: Once | ORAL | Status: AC
  Administered 2014-10-17: 650 mg via ORAL

## 2014-10-17 MED ORDER — ACETAMINOPHEN 325 MG PO TABS
ORAL_TABLET | ORAL | Status: AC
  Filled 2014-10-17: qty 2

## 2014-10-17 NOTE — Patient Instructions (Signed)
[place] Discharge Instructions for Patients Receiving Chemotherapy  Today you received the following chemotherapy agents: Rituxan  To help prevent nausea and vomiting after your treatment, we encourage you to take your nausea medication as prescribed by your physician.   If you develop nausea and vomiting that is not controlled by your nausea medication, call the clinic.   BELOW ARE SYMPTOMS THAT SHOULD BE REPORTED IMMEDIATELY:  *FEVER GREATER THAN 100.5 F  *CHILLS WITH OR WITHOUT FEVER  NAUSEA AND VOMITING THAT IS NOT CONTROLLED WITH YOUR NAUSEA MEDICATION  *UNUSUAL [name]  *UNUSUAL BRUISING OR BLEEDING  TENDERNESS IN MOUTH AND THROAT WITH OR WITHOUT PRESENCE OF ULCERS  *URINARY PROBLEMS  *BOWEL PROBLEMS  UNUSUAL RASH Items with * indicate a potential emergency and should be followed up as soon as possible.  Feel free to call the clinic you have any questions or concerns. The clinic phone number is [phone].  Please show the [place] at check-in to the Emergency Department and triage nurse.

## 2014-10-17 NOTE — Addendum Note (Signed)
Addended by: Marcelino Duster on: 10/17/2014 11:15 AM   Modules accepted: Orders, Medications

## 2014-10-17 NOTE — Patient Instructions (Signed)

## 2014-10-17 NOTE — Progress Notes (Signed)
ID: [name] OB: 22-Oct-1931  MR#: [id number]  CSN#:[id number]  PCP: Michael Pel, MD GYN:   SU: [name] OTHER MD: [name], [name],  [name], [name], [name]  CHIEF COMPLAINT: Non-Hodgkin's lymphoma  CURRENT TREATMENT: Maintenance rituximab    NON-HODGKIN"S LYMPHOMA HISTORY: From the prior summary:  The patient developed right upper quadrant pain last year and he had an ultrasound April 5th which showed some gallstones without evidence of cholecystitis or ductal dilatation.  However, there were multiple lesions in the liver which could not be assessed further.  Accordingly on July 31, 2003, [name], showed multiple liver masses, more than 25, most over 1 cm, the largest being in the inferior right lobe, measuring 4.2 cm. There was also a small mass in the spleen.  CT of the pelvis was unremarkable.   The patient had a biopsy of the liver August 01, 2003.  The report says only that it was "a lesion in the right lobe of the liver".  Presumably this was the largest lesion present.  The final pathology [id number] and [id number]) showed only cirrhosis.     The patient has been followed by [name], and a repeat CT scan of the abdomen and pelvis was obtained May 18, 2004.  Many of the liver lesions seen previously had actually decreased in size.  However, [name], [name], a second ultrasound-guided biopsy was performed 06/11/04. This was a lesion deep in the left lobe of the liver and therefore, I would think not the same one previously biopsied which was in the right  liver. The pathology this time [id number]) shows a poorly differentiated neuroendocrine carcinoma which was positive for chromogranin A, negative for synaptophysin, thyroid transcription factor, a variety of cytokeratins, [name], alpha-fetoprotein and COX-2."  [name] was subsequently evaluated at [facility] by Dr. Otelia [name] and repeat liver biopsy and review of the earlier biopsy here showed a primary hepatic lymphoma. The patient was treated with R-CHOP as detailed below and achieved a complete response. He received maintenance rituximab until 2008  His subsequent history is as detailed below  INTERVAL HISTORY. [name] returns today follow up of his Non-Hodkin's lymphoma accompanied by one of his daughters. Today is day 1 cycle 8 of rituximab maintenance given every 8 weeks. [name] continues to tolerate the dug well with no side effects that he is aware of. The interval history is also unremarkable.  REVIEW OF SYSTEMS:  [name] denies significant weight loss, significant fatigue, rash, pruritus, drenching sweats, or fevers. He has no nausea, vomiting, or change sin bowel or bladder habits. His appetite is healthy. His energy level is variable. He states "I'm still kicking, just not as high." He continues to be active in his garden which of course triggers some low back pain. He denies bruising or bleeding from the xarelto. He has no shortness of breath, chest pian, cough, or palpitations. A detailed review of systems is otherwise stable.  PAST MEDICAL HISTORY: Past Medical History  Diagnosis Date  . Hypertension   . Cancer of liver   .  Kidney stone   . Prostate cancer   . Skin cancer   . Arthritis   . Anemia   . GERD (gastroesophageal reflux disease)   . Lymphoma   . [name]   . Sleep apnea   . Neuropathy in diabetes     Hx: of  . Diabetes mellitus     INSULIN DEPENDENT  . HX, PERSONAL, MALIGNANCY, PROSTATE 07/28/2006    Annotation: 07-20-1999, resected Qualifier: Diagnosis of  By: Michael Sima  MD, [name]    . A-fib   . Near syncope 10/18/2013  . Memory deficit 10/18/2013  . HOH (hard of hearing)   Significant for prostate cancer, the patient undergoing prostatectomy January 2001 under [name] for a Gleason 7, pathologic T3b (positive seminal vesicle involvement) adenocarcinoma with 0 of 2 lymph nodes involved.  Current PSA is followed by Dr. Alinda [name]. Other medical problems include sleep apnea, minor coronary artery disease, hypertension, hypertriglyceridemia, cholelithiasis, colon polyps, cirrhosis by biopsy, diabetes, squamous cell carcinomas of the skin removed by [name], history of nephrolithiasis, status post right renal surgery and history of left rotator cuff repair under [name].    PAST SURGICAL HISTORY: Past Surgical History  Procedure Laterality Date  . Prostatectomy    . Kidney stone surgery    . Rotator cuff repair    . Ankle surgery    . Cardiac catheterization  09/16/96    Normal LV systolic function,dense ca+ prox. portion of the LAD w/50% narrowing in the distal portion, 30-40% irreg. in the proximal portion & 80% narrowing in the ostial portion of the posterolateral branch.  . Colonoscopy      Hx: of  . Infusion port  04/04/2013    RIGHT SUBCLAVIAN  . Cholecystectomy  04/04/2013  . Cholecystectomy N/A 04/04/2013    Procedure: LAPAROSCOPIC CHOLECYSTECTOMY WITH INTRAOPERATIVE CHOLANGIOGRAM;  [name], MD;  Location: [place];  Service: General;  [name];  . Portacath placement N/A 04/04/2013    Procedure: INSERTION PORT-A-CATH;  [name], MD;  Location: [place];  Service: General;  [name];    FAMILY HISTORY Family History  Problem Relation Age of Onset  . Heart disease Father   . Heart attack Father   . Cancer Sister   . Heart attack Brother   . Heart attack Brother   . Heart attack Brother   . Heart attack Brother   . Heart attack Brother   . Cancer Brother   . Cancer Brother   The  patient's father died at the age of 64 from an MI.  The patient's mother died from "old age" at 31.  The patient is one of nine siblings.  One brother died from cancer of the esophagus, 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, [name], a homemaker, died in 19-Jul-2012: [name] is an Scientist, physiological for [employer]; [name], [name]; and [name] is Environmental consultant.  Everybody lives in [place].  The patient has five grandchildren.  He is a member of [place]. [place].    ADVANCED DIRECTIVES: in place   HEALTH MAINTENANCE: History  Substance Use Topics  . Smoking status: Former Smoker  Types: Pipe  . Smokeless tobacco: Former Systems developer    Types: Chew     Comment: [place] YEARS AGO "  . Alcohol Use: No     Colonoscopy:  PSA: Followed by Dr. Alinda [name]  Bone density:  Lipid panel:  Allergies  Allergen Reactions  . Niacin Other (See Comments)    headaches    Current Outpatient Prescriptions  Medication Sig Dispense Refill  . allopurinol (ZYLOPRIM) 300 MG tablet TAKE 1 TABLET (300 MG TOTAL) BY MOUTH DAILY. 30 tablet 3  . amLODipine (NORVASC) 5 MG tablet Take 2.5 mg by mouth daily.     Marland Kitchen atorvastatin (LIPITOR) 20 MG tablet Take 20 mg by mouth at bedtime.    . cetirizine (ZYRTEC) 10 MG tablet Take 10 mg by mouth daily.    . cholecalciferol (VITAMIN D) 1000 UNITS tablet Take 1,000 Units by mouth daily.    . cholestyramine (QUESTRAN) 4 G packet Take 1 packet (4 g total) by mouth 3 (three) times daily with meals. 60 each 12  . Cyanocobalamin (VITAMIN B 12 PO) Take 2,500 mg by mouth daily.     . fenofibrate 160 MG tablet Take 160 mg by mouth at bedtime.    .  gabapentin (NEURONTIN) 300 MG capsule Take 300 mg by mouth 3 (three) times daily.     . insulin NPH Human (HUMULIN N,NOVOLIN N) 100 UNIT/ML injection Inject 60 Units into the skin 2 (two) times daily before a meal.    . lidocaine-prilocaine (EMLA) cream Apply 1 application topically as needed. 30 g 3  . magnesium oxide (MAG-OX) 400 MG tablet Take 400 mg by mouth 2 (two) times daily.    . metFORMIN (GLUCOPHAGE) 1000 MG tablet Take 500 mg by mouth 2 (two) times daily with a meal.    . Multiple Vitamins-Minerals (MULTIVITAMIN WITH MINERALS) tablet Take 1 tablet by mouth daily.    . sertraline (ZOLOFT) 100 MG tablet Take 100 mg by mouth daily.    . vitamin C (ASCORBIC ACID) 500 MG tablet Take 500 mg by mouth daily.    Alveda Reasons 20 MG TABS tablet Take 20 mg by mouth daily with supper.      No current facility-administered medications for this visit.   Facility-Administered Medications Ordered in Other Visits  Medication Dose Route Frequency Provider Last Rate Last Dose  . heparin lock flush 100 unit/mL  500 Units Intracatheter Once PRN [name], MD      . sodium chloride 0.9 % injection 10 mL  10 mL Intracatheter PRN [name], MD        Objective: Elderly white man in no acute distress  Filed Vitals:   10/17/14 0927  BP: 123/63  Pulse: 68  Temp: 97.1 F (36.2 C)  Resp: 18     Body mass index is 28.53 kg/(m^2).      ECOG FS:0 - Asymptomatic  Skin: warm, dry  HEENT: sclerae anicteric, conjunctivae pink, oropharynx clear. No thrush or mucositis.  Lymph Nodes: No cervical or supraclavicular lymphadenopathy  Lungs: clear to auscultation bilaterally, no rales, wheezes, or rhonci  Heart: regular rate and rhythm  Abdomen: round, soft, non tender, positive bowel sounds  Musculoskeletal: No focal spinal tenderness, no peripheral edema  Neuro: non focal, well oriented, positive affect   LAB RESULTS:  CMP     Component Value Date/Time   NA 138 10/17/2014 0905   NA 137  08/19/2013 0825   K 4.0 10/17/2014 0905   K 3.7  08/19/2013 0825   CL 100 08/19/2013 0825   CO2 24 10/17/2014 0905   CO2 24 08/19/2013 0825   GLUCOSE 180* 10/17/2014 0905   GLUCOSE 207* 08/19/2013 0825   BUN 22.1 10/17/2014 0905   BUN 13 08/19/2013 0825   CREATININE 1.6* 10/17/2014 0905   CREATININE 0.92 08/19/2013 0825   CALCIUM 9.3 10/17/2014 0905   CALCIUM 9.6 08/19/2013 0825   CALCIUM 11.0* 08/17/2005 0842   PROT 6.5 10/17/2014 0905   PROT 5.9* 08/16/2013 1253   ALBUMIN 4.0 10/17/2014 0905   ALBUMIN 3.5 08/16/2013 1253   AST 25 10/17/2014 0905   AST 24 08/16/2013 1253   ALT 24 10/17/2014 0905   ALT 21 08/16/2013 1253   ALKPHOS 58 10/17/2014 0905   ALKPHOS 54 08/16/2013 1253   BILITOT 0.48 10/17/2014 0905   BILITOT 0.3 08/16/2013 1253   GFRNONAA 78* 08/19/2013 0825   GFRAA >90 08/19/2013 0825    No results found for: SPEP  Lab Results  Component Value Date   WBC 5.3 10/17/2014   NEUTROABS 4.3 10/17/2014   HGB 11.0* 10/17/2014   HCT 32.4* 10/17/2014   MCV 86.2 10/17/2014   PLT 139* 10/17/2014      Chemistry      Component Value Date/Time   NA 138 10/17/2014 0905   NA 137 08/19/2013 0825   K 4.0 10/17/2014 0905   K 3.7 08/19/2013 0825   CL 100 08/19/2013 0825   CO2 24 10/17/2014 0905   CO2 24 08/19/2013 0825   BUN 22.1 10/17/2014 0905   BUN 13 08/19/2013 0825   CREATININE 1.6* 10/17/2014 0905   CREATININE 0.92 08/19/2013 0825      Component Value Date/Time   CALCIUM 9.3 10/17/2014 0905   CALCIUM 9.6 08/19/2013 0825   CALCIUM 11.0* 08/17/2005 0842   ALKPHOS 58 10/17/2014 0905   ALKPHOS 54 08/16/2013 1253   AST 25 10/17/2014 0905   AST 24 08/16/2013 1253   ALT 24 10/17/2014 0905   ALT 21 08/16/2013 1253   BILITOT 0.48 10/17/2014 0905   BILITOT 0.3 08/16/2013 1253      Urinalysis    Component Value Date/Time   COLORURINE YELLOW 08/16/2013 1317   APPEARANCEUR CLEAR 08/16/2013 1317   LABSPEC 1.009 08/16/2013 1317   PHURINE 6.5 08/16/2013 1317    GLUCOSEU NEGATIVE 08/16/2013 1317   HGBUR NEGATIVE 08/16/2013 1317   BILIRUBINUR NEGATIVE 08/16/2013 1317   KETONESUR NEGATIVE 08/16/2013 1317   PROTEINUR NEGATIVE 08/16/2013 1317   UROBILINOGEN 0.2 08/16/2013 1317   NITRITE NEGATIVE 08/16/2013 1317   LEUKOCYTESUR NEGATIVE 08/16/2013 1317    STUDIES: No results found.   ASSESSMENT:  81 y.o. patient with a diagnosis of    #1 Primary hepatic non-Hodgkin's lymphoma established through liver biopsy February 2006, treated with Rituxan x9 and then CHOP x6.  All chemotherapy completed in 2007.  Maintenance Rituxan discontinued October 2008.     #2  Biopsy proven retroperitoneal recurrence documented November 2014. Bone marrow biopsy 04/01/2013 was negative   #3 status post  laparoscopic cholecystectomy and port placement on 04/04/2013.  #4 Cycle #1  RICE chemotherapy  completed on 05/02/2013.   #5 cycle #2 RICE chemotherapy  on 05/17/2013  #6 PET scan performed on 05/23/2013 revealed marked partial response to chemotherapy with a retroperitoneal mass decrease in metabolic activity from SUV of 13.2 to 6.3. Right adrenal mass size and metabolic activity is resolved when compared to the previous PET scan.   #7 Evaluated at [facility] by Dr. Tomasa Hosteller for  autologous transplant on 05/27/2013 and was quoted a 3% chance of significant heart damage and possibly death from the treatment and a 50% chance of being alive 5 years from now after transplant (versus perhaps 2 years without). After much thought the patient and family have decided firmly they do not wish to proceed to stem cell transplant.  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 treated with vancomycin IV x14 days completed 07/02/2013, followed by doxycycline for 2 additional weeks  #10 started maintenance Rituxan May 2015 (1 dose every 8 weeks)  #11 A-fib, on rivaroxaban  #12 prostate cancer-- under  observation  PLAN: [name] continue to perform well. The labs were reviewed in detail and were entirely stale. He has no symptoms of active disease. He will continue with cycle 8 of rituxumab as planned.   [name] will continue these treatments every 8 weeks indefinitely since he is stable. He will will have a follow up visit at that time as well. He understands and agrees with this plan. He knows the goal of treatment in his case is control. He has been encouraged to call with any issues that might arise before his next visit here.  Laurie Panda, NP   10/17/2014

## 2014-12-12 ENCOUNTER — Ambulatory Visit (HOSPITAL_BASED_OUTPATIENT_CLINIC_OR_DEPARTMENT_OTHER): Payer: Medicare Other | Admitting: Oncology

## 2014-12-12 ENCOUNTER — Telehealth: Payer: Self-pay | Admitting: Oncology

## 2014-12-12 ENCOUNTER — Ambulatory Visit (HOSPITAL_BASED_OUTPATIENT_CLINIC_OR_DEPARTMENT_OTHER): Payer: Medicare Other

## 2014-12-12 ENCOUNTER — Other Ambulatory Visit (HOSPITAL_BASED_OUTPATIENT_CLINIC_OR_DEPARTMENT_OTHER): Payer: Medicare Other

## 2014-12-12 ENCOUNTER — Ambulatory Visit: Payer: Medicare Other

## 2014-12-12 VITALS — BP 138/71 | HR 75 | Temp 98.2°F | Resp 18 | Ht 67.0 in | Wt 182.1 lb

## 2014-12-12 VITALS — BP 123/55 | HR 72 | Temp 98.4°F | Resp 18

## 2014-12-12 DIAGNOSIS — C8580 Other specified types of non-Hodgkin lymphoma, unspecified site: Secondary | ICD-10-CM

## 2014-12-12 DIAGNOSIS — I4891 Unspecified atrial fibrillation: Secondary | ICD-10-CM | POA: Diagnosis not present

## 2014-12-12 DIAGNOSIS — C8599 Non-Hodgkin lymphoma, unspecified, extranodal and solid organ sites: Secondary | ICD-10-CM | POA: Diagnosis not present

## 2014-12-12 DIAGNOSIS — Z5112 Encounter for antineoplastic immunotherapy: Secondary | ICD-10-CM

## 2014-12-12 DIAGNOSIS — C8590 Non-Hodgkin lymphoma, unspecified, unspecified site: Secondary | ICD-10-CM

## 2014-12-12 DIAGNOSIS — C61 Malignant neoplasm of prostate: Secondary | ICD-10-CM

## 2014-12-12 DIAGNOSIS — Z95828 Presence of other vascular implants and grafts: Secondary | ICD-10-CM

## 2014-12-12 LAB — COMPREHENSIVE METABOLIC PANEL (CC13)
ALBUMIN: 4 g/dL (ref 3.5–5.0)
ALK PHOS: 66 U/L (ref 40–150)
ALT: 32 U/L (ref 0–55)
ANION GAP: 12 meq/L — AB (ref 3–11)
AST: 27 U/L (ref 5–34)
BUN: 24.5 mg/dL (ref 7.0–26.0)
CALCIUM: 9.4 mg/dL (ref 8.4–10.4)
CO2: 20 mEq/L — ABNORMAL LOW (ref 22–29)
Chloride: 106 mEq/L (ref 98–109)
Creatinine: 2 mg/dL — ABNORMAL HIGH (ref 0.7–1.3)
EGFR: 30 mL/min/{1.73_m2} — AB (ref >90)
Glucose: 232 mg/dl — ABNORMAL HIGH (ref 70–140)
POTASSIUM: 4.6 meq/L (ref 3.5–5.1)
Sodium: 137 mEq/L (ref 136–145)
Total Bilirubin: 0.42 mg/dL (ref 0.20–1.20)
Total Protein: 6.6 g/dL (ref 6.4–8.3)

## 2014-12-12 LAB — CBC WITH DIFFERENTIAL/PLATELET
BASO%: 0.2 % (ref 0.0–2.0)
Basophils Absolute: 0 10*3/uL (ref 0.0–0.1)
EOS ABS: 0.2 10*3/uL (ref 0.0–0.5)
EOS%: 2.9 % (ref 0.0–7.0)
HCT: 32.5 % — ABNORMAL LOW (ref 38.4–49.9)
HEMOGLOBIN: 11 g/dL — AB (ref 13.0–17.1)
LYMPH%: 6.3 % — ABNORMAL LOW (ref 14.0–49.0)
MCH: 29.2 pg (ref 27.2–33.4)
MCHC: 33.8 g/dL (ref 32.0–36.0)
MCV: 86.2 fL (ref 79.3–98.0)
MONO#: 0.4 10*3/uL (ref 0.1–0.9)
MONO%: 6.3 % (ref 0.0–14.0)
NEUT#: 5.5 10*3/uL (ref 1.5–6.5)
NEUT%: 84.3 % — ABNORMAL HIGH (ref 39.0–75.0)
PLATELETS: 137 10*3/uL — AB (ref 140–400)
RBC: 3.77 10*6/uL — ABNORMAL LOW (ref 4.20–5.82)
RDW: 15.1 % — AB (ref 11.0–14.6)
WBC: 6.5 10*3/uL (ref 4.0–10.3)
lymph#: 0.4 10*3/uL — ABNORMAL LOW (ref 0.9–3.3)

## 2014-12-12 LAB — TECHNOLOGIST REVIEW

## 2014-12-12 MED ORDER — DIPHENHYDRAMINE HCL 25 MG PO CAPS
ORAL_CAPSULE | ORAL | Status: AC
  Filled 2014-12-12: qty 1

## 2014-12-12 MED ORDER — ACETAMINOPHEN 325 MG PO TABS
650.0000 mg | ORAL_TABLET | Freq: Once | ORAL | Status: AC
  Administered 2014-12-12: 650 mg via ORAL

## 2014-12-12 MED ORDER — SODIUM CHLORIDE 0.9 % IJ SOLN
10.0000 mL | INTRAMUSCULAR | Status: DC | PRN
  Administered 2014-12-12: 10 mL
  Filled 2014-12-12: qty 10

## 2014-12-12 MED ORDER — HEPARIN SOD (PORK) LOCK FLUSH 100 UNIT/ML IV SOLN
500.0000 [IU] | Freq: Once | INTRAVENOUS | Status: AC | PRN
  Administered 2014-12-12: 500 [IU]
  Filled 2014-12-12: qty 5

## 2014-12-12 MED ORDER — SODIUM CHLORIDE 0.9 % IV SOLN
Freq: Once | INTRAVENOUS | Status: AC
  Administered 2014-12-12: 09:00:00 via INTRAVENOUS

## 2014-12-12 MED ORDER — SODIUM CHLORIDE 0.9 % IJ SOLN
10.0000 mL | INTRAMUSCULAR | Status: DC | PRN
  Administered 2014-12-12: 10 mL via INTRAVENOUS
  Filled 2014-12-12: qty 10

## 2014-12-12 MED ORDER — DIPHENHYDRAMINE HCL 25 MG PO CAPS
25.0000 mg | ORAL_CAPSULE | Freq: Once | ORAL | Status: AC
  Administered 2014-12-12: 25 mg via ORAL

## 2014-12-12 MED ORDER — SODIUM CHLORIDE 0.9 % IV SOLN
375.0000 mg/m2 | Freq: Once | INTRAVENOUS | Status: AC
  Administered 2014-12-12: 800 mg via INTRAVENOUS
  Filled 2014-12-12: qty 80

## 2014-12-12 MED ORDER — ACETAMINOPHEN 325 MG PO TABS
ORAL_TABLET | ORAL | Status: AC
  Filled 2014-12-12: qty 2

## 2014-12-12 NOTE — Telephone Encounter (Signed)
Gave avs & calendar for October/December.

## 2014-12-12 NOTE — Patient Instructions (Signed)

## 2014-12-12 NOTE — Progress Notes (Signed)
Spoke with Dr. Jana Hakim about pt's Creatinine level of 2.0. Per MD continue with treatment today.

## 2014-12-12 NOTE — Progress Notes (Signed)
ID: [name] OB: 07/25/1931  MR#: [id number]  CSN#:[id number]  PCP: Horatio Pel, MD GYN:   SU: [name] OTHER MD: [name], [name],  [name], [name], [name]  CHIEF COMPLAINT: Non-Hodgkin's lymphoma  CURRENT TREATMENT: Maintenance rituximab    NON-HODGKIN"S LYMPHOMA HISTORY: From the prior summary:  The patient developed right upper quadrant pain last year and he had an ultrasound April 5th which showed some gallstones without evidence of cholecystitis or ductal dilatation.  However, there were multiple lesions in the liver which could not be assessed further.  Accordingly on July 31, 2003, [name], showed multiple liver masses, more than 25, most over 1 cm, the largest being in the inferior right lobe, measuring 4.2 cm. There was also a small mass in the spleen.  CT of the pelvis was unremarkable.   The patient had a biopsy of the liver August 01, 2003.  The report says only that it was "a lesion in the right lobe of the liver".  Presumably this was the largest lesion present.  The final pathology [id number] and [phone]) showed only cirrhosis.     The patient has been followed by [name], and a repeat CT scan of the abdomen and pelvis was obtained May 18, 2004.  Many of the liver lesions seen previously had actually decreased in size.  However, [name], [name], a second ultrasound-guided biopsy was performed 06/11/04. This was a lesion deep in the left lobe of the liver and therefore, I would think not the same one previously biopsied which was in the right  liver. The pathology this time [phone]) shows a poorly differentiated neuroendocrine carcinoma which was positive for chromogranin A, negative for synaptophysin, thyroid transcription factor, a variety of cytokeratins, [name], alpha-fetoprotein and COX-2."  Mr. Willden was subsequently evaluated at [facility] by Dr. Otelia Limes and repeat liver biopsy and review of the earlier biopsy here showed a primary hepatic lymphoma. The patient was treated with R-CHOP as detailed below and achieved a complete response. He received maintenance rituximab until 2008  His subsequent history is as detailed below  INTERVAL HISTORY. AC returns today follow up of his Non-Hodkin's lymphoma accompanied by one of his daughters. Today is day 1 cycle 9 of rituximab maintenance given every 8 weeks, to be continued indefinitely. AC tolerates the rituximab with no side effects that he is aware of  REVIEW OF SYSTEMS:  AC has been working hard on his garden and taking care of his dog. He has lost a little weight because he is more active: He tends to gain weight in the winter and lose it in the summer. There is no unusual fatigue, no rash, no itching, no adenopathy, and in short no symptoms suggestive of disease recurrence or progression. He has a mild runny nose. He is not checking his blood sugars. He does not know his current PSA. A detailed review of systems today was otherwise stable  PAST MEDICAL HISTORY: Past Medical History  Diagnosis Date  . Hypertension   . Cancer of liver   . Kidney  stone   . Prostate cancer   . Skin cancer   . Arthritis   . Anemia   . GERD (gastroesophageal reflux disease)   . Lymphoma   . [name]   . Sleep apnea   . Neuropathy in diabetes     Hx: of  . Diabetes mellitus     INSULIN DEPENDENT  . HX, PERSONAL, MALIGNANCY, PROSTATE 07/28/2006    Annotation: 07/14/1999, resected Qualifier: Diagnosis of  By: [name], [name]    . A-fib   . Near syncope 10/18/2013  . Memory deficit  10/18/2013  . HOH (hard of hearing)   Significant for prostate cancer, the patient undergoing prostatectomy January 2001 under [facility] for a Gleason 7, pathologic T3b (positive seminal vesicle involvement) adenocarcinoma with 0 of 2 lymph nodes involved.  Current PSA is followed by Dr. [name]. Other medical problems include sleep apnea, minor coronary artery disease, hypertension, hypertriglyceridemia, cholelithiasis, colon polyps, cirrhosis by biopsy, diabetes, [name], history of nephrolithiasis, status post right renal surgery and history of left rotator cuff repair under [name].    PAST SURGICAL HISTORY: Past Surgical History  Procedure Laterality Date  . Prostatectomy    . Kidney stone surgery    . Rotator cuff repair    . Ankle surgery    . Cardiac catheterization  09/16/96    Normal LV systolic function,dense ca+ prox. portion of the LAD w/50% narrowing in the distal portion, 30-40% irreg. in the proximal portion & 80% narrowing in the ostial portion of the posterolateral branch.  . Colonoscopy      Hx: of  . Infusion port  04/04/2013    RIGHT SUBCLAVIAN  . Cholecystectomy  04/04/2013  . Cholecystectomy N/A 04/04/2013    Procedure: LAPAROSCOPIC CHOLECYSTECTOMY WITH INTRAOPERATIVE CHOLANGIOGRAM;  [name], MD;  Location: [place];  Service: General;  [name];  . Portacath placement N/A 04/04/2013    Procedure: INSERTION PORT-A-CATH;  [name], MD;  Location: [place];  Service: General;  [name];    FAMILY HISTORY Family History  Problem Relation Age of Onset  . Heart disease Father   . Heart attack Father   . Cancer Sister   . Heart attack Brother   . Heart attack Brother   . Heart attack Brother   . Heart attack Brother   . Heart attack Brother   . Cancer Brother   . Cancer Brother   The patient's father died at the age of 50 from an MI.  The patient's mother died  from "old age" at 65.  The patient is one of nine siblings.  One brother died from cancer of the esophagus, 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, [name], a homemaker, died in July 13, 2012: [name] is an Scientist, physiological for [employer]; [name], [name]; and [name] is Environmental consultant.  Everybody lives in [place].  The patient has five grandchildren.  He is a member of [place].    ADVANCED DIRECTIVES: in place   HEALTH MAINTENANCE: Social History  Substance Use Topics  . Smoking status: Former Smoker  Types: Pipe  . Smokeless tobacco: Former Systems developer    Types: Chew     Comment: [place] YEARS AGO "  . Alcohol Use: No     Colonoscopy:  PSA: Followed by Dr. [name]  Bone density:  Lipid panel:  Allergies  Allergen Reactions  . Niacin Other (See Comments)    headaches    Current Outpatient Prescriptions  Medication Sig Dispense Refill  . allopurinol (ZYLOPRIM) 300 MG tablet TAKE 1 TABLET (300 MG TOTAL) BY MOUTH DAILY. 30 tablet 3  . amLODipine (NORVASC) 5 MG tablet Take 2.5 mg by mouth daily.     Marland Kitchen atorvastatin (LIPITOR) 20 MG tablet Take 20 mg by mouth at bedtime.    . cetirizine (ZYRTEC) 10 MG tablet Take 10 mg by mouth daily.    . cholecalciferol (VITAMIN D) 1000 UNITS tablet Take 1,000 Units by mouth daily.    . cholestyramine (QUESTRAN) 4 G packet Take 1 packet (4 g total) by mouth 3 (three) times daily with meals. 60 each 12  . Cyanocobalamin (VITAMIN B 12 PO) Take 2,500 mg by mouth daily.     . fenofibrate 160 MG tablet Take 160 mg by mouth at bedtime.    . gabapentin (NEURONTIN) 300 MG capsule Take 300 mg by mouth 3 (three)  times daily.     . Insulin Glargine (TOUJEO SOLOSTAR [place]) Inject 60 Units into the skin.    Marland Kitchen lidocaine-prilocaine (EMLA) cream Apply 1 application topically as needed. 30 g 3  . magnesium oxide (MAG-OX) 400 MG tablet Take 400 mg by mouth 2 (two) times daily.    . metFORMIN (GLUCOPHAGE) 1000 MG tablet Take 500 mg by mouth 2 (two) times daily with a meal.    . Multiple Vitamins-Minerals (MULTIVITAMIN WITH MINERALS) tablet Take 1 tablet by mouth daily.    . Rivaroxaban (XARELTO) 15 MG TABS tablet Take 15 mg by mouth daily.    . sertraline (ZOLOFT) 100 MG tablet Take 100 mg by mouth daily.    . vitamin C (ASCORBIC ACID) 500 MG tablet Take 500 mg by mouth daily.     No current facility-administered medications for this visit.    Objective: Older white man who appears well  Filed Vitals:   12/12/14 0907  BP: 138/71  Pulse: 75  Temp: 98.2 F (36.8 C)  Resp: 18     Body mass index is 28.51 kg/(m^2).      ECOG FS:0 - Asymptomatic  Sclerae unicteric, EOMs intact Oropharynx clear, slightly dry No cervical or supraclavicular adenopathy, no axillary or inguinal adenopathy Lungs no rales or rhonchi Heart regular rate and rhythm Abd soft, nontender, positive bowel sounds, no splenomegaly MSK no focal spinal tenderness, no joint edema Neuro: nonfocal, well oriented, appropriate affect    LAB RESULTS:  CMP     Component Value Date/Time   NA 138 10/17/2014 0905   NA 137 08/19/2013 0825   K 4.0 10/17/2014 0905   K 3.7 08/19/2013 0825   CL 100 08/19/2013 0825   CO2 24 10/17/2014 0905   CO2 24 08/19/2013 0825   GLUCOSE 180* 10/17/2014 0905   GLUCOSE 207* 08/19/2013 0825   BUN 22.1 10/17/2014 0905   BUN 13 08/19/2013 0825   CREATININE 1.6* 10/17/2014 0905   CREATININE 0.92 08/19/2013 0825   CALCIUM 9.3 10/17/2014 0905   CALCIUM 9.6 08/19/2013 0825   CALCIUM 11.0* 08/17/2005 0842   PROT 6.5 10/17/2014 0905   PROT 5.9* 08/16/2013 1253   ALBUMIN 4.0 10/17/2014  0905   ALBUMIN 3.5  08/16/2013 1253   AST 25 10/17/2014 0905   AST 24 08/16/2013 1253   ALT 24 10/17/2014 0905   ALT 21 08/16/2013 1253   ALKPHOS 58 10/17/2014 0905   ALKPHOS 54 08/16/2013 1253   BILITOT 0.48 10/17/2014 0905   BILITOT 0.3 08/16/2013 1253   GFRNONAA 78* 08/19/2013 0825   GFRAA >90 08/19/2013 0825    No results found for: SPEP  Lab Results  Component Value Date   WBC 6.5 12/12/2014   NEUTROABS 5.5 12/12/2014   HGB 11.0* 12/12/2014   HCT 32.5* 12/12/2014   MCV 86.2 12/12/2014   PLT 137* 12/12/2014      Chemistry      Component Value Date/Time   NA 138 10/17/2014 0905   NA 137 08/19/2013 0825   K 4.0 10/17/2014 0905   K 3.7 08/19/2013 0825   CL 100 08/19/2013 0825   CO2 24 10/17/2014 0905   CO2 24 08/19/2013 0825   BUN 22.1 10/17/2014 0905   BUN 13 08/19/2013 0825   CREATININE 1.6* 10/17/2014 0905   CREATININE 0.92 08/19/2013 0825      Component Value Date/Time   CALCIUM 9.3 10/17/2014 0905   CALCIUM 9.6 08/19/2013 0825   CALCIUM 11.0* 08/17/2005 0842   ALKPHOS 58 10/17/2014 0905   ALKPHOS 54 08/16/2013 1253   AST 25 10/17/2014 0905   AST 24 08/16/2013 1253   ALT 24 10/17/2014 0905   ALT 21 08/16/2013 1253   BILITOT 0.48 10/17/2014 0905   BILITOT 0.3 08/16/2013 1253      Urinalysis    Component Value Date/Time   COLORURINE YELLOW 08/16/2013 1317   APPEARANCEUR CLEAR 08/16/2013 1317   LABSPEC 1.009 08/16/2013 1317   PHURINE 6.5 08/16/2013 1317   GLUCOSEU NEGATIVE 08/16/2013 1317   HGBUR NEGATIVE 08/16/2013 1317   [place] 08/16/2013 1317   KETONESUR NEGATIVE 08/16/2013 1317   PROTEINUR NEGATIVE 08/16/2013 1317   UROBILINOGEN 0.2 08/16/2013 1317   NITRITE NEGATIVE 08/16/2013 1317   LEUKOCYTESUR NEGATIVE 08/16/2013 1317    STUDIES: No results found.   ASSESSMENT:  81 y.o. patient with a diagnosis of    #1 Primary hepatic non-Hodgkin's lymphoma established through liver biopsy February 2006, treated with Rituxan x9 and then CHOP x6.  All  chemotherapy completed in 2007.  Maintenance Rituxan discontinued October 2008.     #2  Biopsy proven retroperitoneal recurrence documented November 2014. Bone marrow biopsy 04/01/2013 was negative   #3 status post  laparoscopic cholecystectomy and port placement on 04/04/2013.  #4 Cycle #1  RICE chemotherapy  completed on 05/02/2013.   #5 cycle #2 RICE chemotherapy  on 05/17/2013  #6 PET scan performed on 05/23/2013 revealed marked partial response to chemotherapy with a retroperitoneal mass decrease in metabolic activity from SUV of 13.2 to 6.3. Right adrenal mass size and metabolic activity is resolved when compared to the previous PET scan.   #7 Evaluated at [facility] by Dr. Tomasa Hosteller for autologous transplant on 05/27/2013 and was quoted a 3% chance of significant heart damage and possibly death from the treatment and a 50% chance of being alive 5 years from now after transplant (versus perhaps 2 years without). After much thought the patient and family have decided firmly they do not wish to proceed to stem cell transplant.  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 treated with vancomycin IV x14 days completed 07/02/2013, followed by doxycycline for  2 additional weeks  #10 started maintenance Rituxan May 2015 (1 dose every 8 weeks)  #11 A-fib, on rivaroxaban  #12 prostate cancer-- per Dr [name]  PLAN: [facility] looks well clinically, with no symptoms or signs to suggest active lymphoma. We're going to continue maintenance rituximab every 8 weeks so long as there is no evidence of disease progression. Those orders have been entered.  When he sees me December 13 we will decide whether to obtain a repeat PET scan before the end of the year.  He has an appointment with Dr. [name] late September for follow-up of his prostate cancer and concerns regarding hydronephrosis.  AC knows to call for fever, unexplained fatigue,  unexplained weight loss, rash, pruritus, adenopathy, or any other symptom suggestive of disease recurrence.  [name], MD   12/12/2014

## 2014-12-12 NOTE — Patient Instructions (Signed)

## 2014-12-26 DIAGNOSIS — L820 Inflamed seborrheic keratosis: Secondary | ICD-10-CM | POA: Diagnosis not present

## 2014-12-26 DIAGNOSIS — Z85828 Personal history of other malignant neoplasm of skin: Secondary | ICD-10-CM | POA: Diagnosis not present

## 2014-12-26 DIAGNOSIS — L821 Other seborrheic keratosis: Secondary | ICD-10-CM | POA: Diagnosis not present

## 2014-12-26 DIAGNOSIS — L570 Actinic keratosis: Secondary | ICD-10-CM | POA: Diagnosis not present

## 2014-12-30 DIAGNOSIS — I10 Essential (primary) hypertension: Secondary | ICD-10-CM | POA: Diagnosis not present

## 2014-12-30 DIAGNOSIS — E785 Hyperlipidemia, unspecified: Secondary | ICD-10-CM | POA: Diagnosis not present

## 2014-12-30 DIAGNOSIS — E119 Type 2 diabetes mellitus without complications: Secondary | ICD-10-CM | POA: Diagnosis not present

## 2015-01-01 DIAGNOSIS — I4891 Unspecified atrial fibrillation: Secondary | ICD-10-CM | POA: Diagnosis not present

## 2015-01-01 DIAGNOSIS — E119 Type 2 diabetes mellitus without complications: Secondary | ICD-10-CM | POA: Diagnosis not present

## 2015-01-01 DIAGNOSIS — E785 Hyperlipidemia, unspecified: Secondary | ICD-10-CM | POA: Diagnosis not present

## 2015-01-01 DIAGNOSIS — I10 Essential (primary) hypertension: Secondary | ICD-10-CM | POA: Diagnosis not present

## 2015-01-22 DIAGNOSIS — C61 Malignant neoplasm of prostate: Secondary | ICD-10-CM | POA: Diagnosis not present

## 2015-01-28 DIAGNOSIS — N1330 Unspecified hydronephrosis: Secondary | ICD-10-CM | POA: Diagnosis not present

## 2015-01-28 DIAGNOSIS — C61 Malignant neoplasm of prostate: Secondary | ICD-10-CM | POA: Diagnosis not present

## 2015-01-28 DIAGNOSIS — N3289 Other specified disorders of bladder: Secondary | ICD-10-CM | POA: Diagnosis not present

## 2015-01-28 DIAGNOSIS — R3915 Urgency of urination: Secondary | ICD-10-CM | POA: Diagnosis not present

## 2015-01-28 DIAGNOSIS — R310 Gross hematuria: Secondary | ICD-10-CM | POA: Diagnosis not present

## 2015-01-29 ENCOUNTER — Other Ambulatory Visit (HOSPITAL_COMMUNITY): Payer: Self-pay | Admitting: Urology

## 2015-01-29 DIAGNOSIS — N1330 Unspecified hydronephrosis: Secondary | ICD-10-CM

## 2015-01-29 DIAGNOSIS — N179 Acute kidney failure, unspecified: Secondary | ICD-10-CM

## 2015-01-30 ENCOUNTER — Other Ambulatory Visit: Payer: Self-pay | Admitting: Urology

## 2015-01-30 NOTE — Progress Notes (Signed)
Please release orders in Epic surgery 02-16-15 pre op 02-09-15 [place]

## 2015-02-01 ENCOUNTER — Other Ambulatory Visit: Payer: Self-pay | Admitting: Oncology

## 2015-02-02 ENCOUNTER — Telehealth: Payer: Self-pay

## 2015-02-02 NOTE — Telephone Encounter (Signed)
Patient's daughter [name] called and was asking whether patient would be having chemo on Friday because his creatinine level is rising.  He will be having a renal US this Thursday and is scheduled to have surgery on 02/16/15- surgeon is ok with chemo if indicated and creatinine level is acceptable.  Writer encouraged her to have patient come to the appts on Friday .  [name] stated that the surgeon feels that patient has metastasized and did not think patient would need chemo on Friday however will bring him for the appt.  She wondered if Dr. Jana Hakim was aware.

## 2015-02-04 ENCOUNTER — Observation Stay (HOSPITAL_COMMUNITY): Payer: Medicare Other

## 2015-02-04 ENCOUNTER — Other Ambulatory Visit: Payer: Self-pay

## 2015-02-04 ENCOUNTER — Telehealth: Payer: Self-pay | Admitting: *Deleted

## 2015-02-04 ENCOUNTER — Emergency Department (HOSPITAL_COMMUNITY): Payer: Medicare Other

## 2015-02-04 ENCOUNTER — Other Ambulatory Visit: Payer: Self-pay | Admitting: Oncology

## 2015-02-04 ENCOUNTER — Encounter (HOSPITAL_COMMUNITY): Payer: Self-pay | Admitting: Emergency Medicine

## 2015-02-04 ENCOUNTER — Inpatient Hospital Stay (HOSPITAL_COMMUNITY)
Admission: EM | Admit: 2015-02-04 | Discharge: 2015-02-08 | DRG: 668 | Disposition: A | Discharge status: Home / Self Care | Payer: Medicare Other | Attending: Internal Medicine | Admitting: Internal Medicine

## 2015-02-04 ENCOUNTER — Telehealth: Payer: Self-pay

## 2015-02-04 DIAGNOSIS — C8510 Unspecified B-cell lymphoma, unspecified site: Secondary | ICD-10-CM | POA: Diagnosis present

## 2015-02-04 DIAGNOSIS — Z888 Allergy status to other drugs, medicaments and biological substances status: Secondary | ICD-10-CM

## 2015-02-04 DIAGNOSIS — D696 Thrombocytopenia, unspecified: Secondary | ICD-10-CM | POA: Diagnosis not present

## 2015-02-04 DIAGNOSIS — Z6828 Body mass index (BMI) 28.0-28.9, adult: Secondary | ICD-10-CM

## 2015-02-04 DIAGNOSIS — R0902 Hypoxemia: Secondary | ICD-10-CM | POA: Diagnosis present

## 2015-02-04 DIAGNOSIS — I130 Hypertensive heart and chronic kidney disease with heart failure and stage 1 through stage 4 chronic kidney disease, or unspecified chronic kidney disease: Secondary | ICD-10-CM | POA: Diagnosis present

## 2015-02-04 DIAGNOSIS — N179 Acute kidney failure, unspecified: Secondary | ICD-10-CM | POA: Diagnosis not present

## 2015-02-04 DIAGNOSIS — N289 Disorder of kidney and ureter, unspecified: Secondary | ICD-10-CM

## 2015-02-04 DIAGNOSIS — E1310 Other specified diabetes mellitus with ketoacidosis without coma: Secondary | ICD-10-CM | POA: Diagnosis not present

## 2015-02-04 DIAGNOSIS — Z8505 Personal history of malignant neoplasm of liver: Secondary | ICD-10-CM

## 2015-02-04 DIAGNOSIS — C8590 Non-Hodgkin lymphoma, unspecified, unspecified site: Secondary | ICD-10-CM | POA: Diagnosis not present

## 2015-02-04 DIAGNOSIS — N12 Tubulo-interstitial nephritis, not specified as acute or chronic: Secondary | ICD-10-CM | POA: Diagnosis not present

## 2015-02-04 DIAGNOSIS — E1110 Type 2 diabetes mellitus with ketoacidosis without coma: Secondary | ICD-10-CM | POA: Diagnosis present

## 2015-02-04 DIAGNOSIS — N189 Chronic kidney disease, unspecified: Secondary | ICD-10-CM | POA: Diagnosis present

## 2015-02-04 DIAGNOSIS — N138 Other obstructive and reflux uropathy: Secondary | ICD-10-CM | POA: Diagnosis present

## 2015-02-04 DIAGNOSIS — Z9079 Acquired absence of other genital organ(s): Secondary | ICD-10-CM

## 2015-02-04 DIAGNOSIS — I480 Paroxysmal atrial fibrillation: Secondary | ICD-10-CM | POA: Diagnosis present

## 2015-02-04 DIAGNOSIS — N183 Chronic kidney disease, stage 3 unspecified: Secondary | ICD-10-CM | POA: Diagnosis present

## 2015-02-04 DIAGNOSIS — Z87891 Personal history of nicotine dependence: Secondary | ICD-10-CM

## 2015-02-04 DIAGNOSIS — R319 Hematuria, unspecified: Secondary | ICD-10-CM | POA: Diagnosis not present

## 2015-02-04 DIAGNOSIS — E860 Dehydration: Secondary | ICD-10-CM | POA: Diagnosis present

## 2015-02-04 DIAGNOSIS — R509 Fever, unspecified: Secondary | ICD-10-CM

## 2015-02-04 DIAGNOSIS — Z7901 Long term (current) use of anticoagulants: Secondary | ICD-10-CM

## 2015-02-04 DIAGNOSIS — Z23 Encounter for immunization: Secondary | ICD-10-CM

## 2015-02-04 DIAGNOSIS — Z79899 Other long term (current) drug therapy: Secondary | ICD-10-CM

## 2015-02-04 DIAGNOSIS — K7460 Unspecified cirrhosis of liver: Secondary | ICD-10-CM | POA: Diagnosis present

## 2015-02-04 DIAGNOSIS — Z87442 Personal history of urinary calculi: Secondary | ICD-10-CM

## 2015-02-04 DIAGNOSIS — N390 Urinary tract infection, site not specified: Secondary | ICD-10-CM | POA: Diagnosis present

## 2015-02-04 DIAGNOSIS — Z9221 Personal history of antineoplastic chemotherapy: Secondary | ICD-10-CM

## 2015-02-04 DIAGNOSIS — N131 Hydronephrosis with ureteral stricture, not elsewhere classified: Secondary | ICD-10-CM | POA: Diagnosis present

## 2015-02-04 DIAGNOSIS — M1990 Unspecified osteoarthritis, unspecified site: Secondary | ICD-10-CM | POA: Diagnosis present

## 2015-02-04 DIAGNOSIS — G629 Polyneuropathy, unspecified: Secondary | ICD-10-CM

## 2015-02-04 DIAGNOSIS — Z8546 Personal history of malignant neoplasm of prostate: Secondary | ICD-10-CM

## 2015-02-04 DIAGNOSIS — N135 Crossing vessel and stricture of ureter without hydronephrosis: Secondary | ICD-10-CM | POA: Diagnosis present

## 2015-02-04 DIAGNOSIS — D630 Anemia in neoplastic disease: Secondary | ICD-10-CM | POA: Diagnosis not present

## 2015-02-04 DIAGNOSIS — R3915 Urgency of urination: Secondary | ICD-10-CM | POA: Diagnosis present

## 2015-02-04 DIAGNOSIS — Z8249 Family history of ischemic heart disease and other diseases of the circulatory system: Secondary | ICD-10-CM

## 2015-02-04 DIAGNOSIS — G4730 Sleep apnea, unspecified: Secondary | ICD-10-CM | POA: Diagnosis present

## 2015-02-04 DIAGNOSIS — H9190 Unspecified hearing loss, unspecified ear: Secondary | ICD-10-CM | POA: Diagnosis present

## 2015-02-04 DIAGNOSIS — E1140 Type 2 diabetes mellitus with diabetic neuropathy, unspecified: Secondary | ICD-10-CM | POA: Diagnosis present

## 2015-02-04 DIAGNOSIS — Z794 Long term (current) use of insulin: Secondary | ICD-10-CM

## 2015-02-04 DIAGNOSIS — I5042 Chronic combined systolic (congestive) and diastolic (congestive) heart failure: Secondary | ICD-10-CM | POA: Diagnosis not present

## 2015-02-04 DIAGNOSIS — E871 Hypo-osmolality and hyponatremia: Secondary | ICD-10-CM

## 2015-02-04 DIAGNOSIS — C61 Malignant neoplasm of prostate: Secondary | ICD-10-CM | POA: Diagnosis present

## 2015-02-04 DIAGNOSIS — E1122 Type 2 diabetes mellitus with diabetic chronic kidney disease: Secondary | ICD-10-CM | POA: Diagnosis present

## 2015-02-04 DIAGNOSIS — Z809 Family history of malignant neoplasm, unspecified: Secondary | ICD-10-CM

## 2015-02-04 DIAGNOSIS — K219 Gastro-esophageal reflux disease without esophagitis: Secondary | ICD-10-CM | POA: Diagnosis present

## 2015-02-04 DIAGNOSIS — Z66 Do not resuscitate: Secondary | ICD-10-CM | POA: Diagnosis present

## 2015-02-04 DIAGNOSIS — Z85828 Personal history of other malignant neoplasm of skin: Secondary | ICD-10-CM

## 2015-02-04 LAB — CBC WITH DIFFERENTIAL/PLATELET
Basophils Absolute: 0 10*3/uL (ref 0.0–0.1)
Basophils Relative: 0 %
EOS ABS: 0 10*3/uL (ref 0.0–0.7)
EOS PCT: 0 %
HCT: 29.6 % — ABNORMAL LOW (ref 39.0–52.0)
Hemoglobin: 9.8 g/dL — ABNORMAL LOW (ref 13.0–17.0)
LYMPHS ABS: 0.3 10*3/uL — AB (ref 0.7–4.0)
LYMPHS PCT: 4 %
MCH: 29.1 pg (ref 26.0–34.0)
MCHC: 33.1 g/dL (ref 30.0–36.0)
MCV: 87.8 fL (ref 78.0–100.0)
MONO ABS: 0.5 10*3/uL (ref 0.1–1.0)
Monocytes Relative: 6 %
Neutro Abs: 6.3 10*3/uL (ref 1.7–7.7)
Neutrophils Relative %: 90 %
PLATELETS: 134 10*3/uL — AB (ref 150–400)
RBC: 3.37 MIL/uL — ABNORMAL LOW (ref 4.22–5.81)
RDW: 15.2 % (ref 11.5–15.5)
WBC: 7.1 10*3/uL (ref 4.0–10.5)

## 2015-02-04 LAB — I-STAT CG4 LACTIC ACID, ED
LACTIC ACID, VENOUS: 0.92 mmol/L (ref 0.5–2.0)
Lactic Acid, Venous: 1.21 mmol/L (ref 0.5–2.0)

## 2015-02-04 LAB — URINALYSIS, ROUTINE W REFLEX MICROSCOPIC
BILIRUBIN URINE: NEGATIVE
Glucose, UA: 250 mg/dL — AB
KETONES UR: NEGATIVE mg/dL
NITRITE: POSITIVE — AB
Protein, ur: 100 mg/dL — AB
Specific Gravity, Urine: 1.017 (ref 1.005–1.030)
UROBILINOGEN UA: 0.2 mg/dL (ref 0.0–1.0)
pH: 5.5 (ref 5.0–8.0)

## 2015-02-04 LAB — COMPREHENSIVE METABOLIC PANEL
ALBUMIN: 4 g/dL (ref 3.5–5.0)
ALT: 26 U/L (ref 17–63)
AST: 30 U/L (ref 15–41)
Alkaline Phosphatase: 57 U/L (ref 38–126)
Anion gap: 7 (ref 5–15)
BILIRUBIN TOTAL: 0.9 mg/dL (ref 0.3–1.2)
BUN: 46 mg/dL — AB (ref 6–20)
CHLORIDE: 103 mmol/L (ref 101–111)
CO2: 19 mmol/L — ABNORMAL LOW (ref 22–32)
CREATININE: 2.78 mg/dL — AB (ref 0.61–1.24)
Calcium: 8.9 mg/dL (ref 8.9–10.3)
GFR calc Af Amer: 23 mL/min — ABNORMAL LOW (ref >60)
GFR, EST NON AFRICAN AMERICAN: 20 mL/min — AB (ref >60)
GLUCOSE: 176 mg/dL — AB (ref 65–99)
POTASSIUM: 4.3 mmol/L (ref 3.5–5.1)
Sodium: 129 mmol/L — ABNORMAL LOW (ref 135–145)
TOTAL PROTEIN: 7.2 g/dL (ref 6.5–8.1)

## 2015-02-04 LAB — URINE MICROSCOPIC-ADD ON

## 2015-02-04 LAB — TROPONIN I

## 2015-02-04 LAB — GLUCOSE, CAPILLARY: GLUCOSE-CAPILLARY: 233 mg/dL — AB (ref 65–99)

## 2015-02-04 MED ORDER — ALLOPURINOL 300 MG PO TABS
300.0000 mg | ORAL_TABLET | Freq: Every day | ORAL | Status: DC
  Administered 2015-02-05 – 2015-02-08 (×4): 300 mg via ORAL
  Filled 2015-02-04 (×4): qty 1

## 2015-02-04 MED ORDER — SODIUM CHLORIDE 0.9 % IV SOLN
1000.0000 mL | INTRAVENOUS | Status: DC
  Administered 2015-02-04: 1000 mL via INTRAVENOUS

## 2015-02-04 MED ORDER — SODIUM CHLORIDE 0.9 % IJ SOLN
3.0000 mL | Freq: Two times a day (BID) | INTRAMUSCULAR | Status: DC
  Administered 2015-02-04 – 2015-02-07 (×6): 3 mL via INTRAVENOUS

## 2015-02-04 MED ORDER — INSULIN ASPART 100 UNIT/ML ~~LOC~~ SOLN
0.0000 [IU] | Freq: Three times a day (TID) | SUBCUTANEOUS | Status: DC
  Administered 2015-02-05: 2 [IU] via SUBCUTANEOUS
  Administered 2015-02-05: 1 [IU] via SUBCUTANEOUS
  Administered 2015-02-05: 5 [IU] via SUBCUTANEOUS
  Administered 2015-02-06 (×2): 3 [IU] via SUBCUTANEOUS
  Administered 2015-02-06 – 2015-02-07 (×2): 5 [IU] via SUBCUTANEOUS
  Administered 2015-02-07: 7 [IU] via SUBCUTANEOUS
  Administered 2015-02-07: 5 [IU] via SUBCUTANEOUS
  Administered 2015-02-08: 3 [IU] via SUBCUTANEOUS
  Administered 2015-02-08: 1 [IU] via SUBCUTANEOUS

## 2015-02-04 MED ORDER — SODIUM CHLORIDE 0.9 % IV SOLN: INTRAVENOUS | Status: DC

## 2015-02-04 MED ORDER — ACETAMINOPHEN 500 MG PO TABS: 1000.0000 mg | ORAL_TABLET | Freq: Once | ORAL | Status: DC

## 2015-02-04 MED ORDER — INSULIN ASPART 100 UNIT/ML ~~LOC~~ SOLN
0.0000 [IU] | Freq: Every day | SUBCUTANEOUS | Status: DC
  Administered 2015-02-05 – 2015-02-07 (×4): 2 [IU] via SUBCUTANEOUS

## 2015-02-04 MED ORDER — GABAPENTIN 300 MG PO CAPS
300.0000 mg | ORAL_CAPSULE | Freq: Three times a day (TID) | ORAL | Status: DC
  Administered 2015-02-04 – 2015-02-08 (×10): 300 mg via ORAL
  Filled 2015-02-04 (×10): qty 1

## 2015-02-04 MED ORDER — ONDANSETRON HCL 4 MG/2ML IJ SOLN: 4.0000 mg | Freq: Four times a day (QID) | INTRAMUSCULAR | Status: DC | PRN

## 2015-02-04 MED ORDER — ACETAMINOPHEN 650 MG RE SUPP
650.0000 mg | Freq: Four times a day (QID) | RECTAL | Status: DC | PRN
Start: 2015-02-04 — End: 2015-02-08

## 2015-02-04 MED ORDER — DEXTROSE 5 % IV SOLN
1.0000 g | Freq: Every day | INTRAVENOUS | Status: DC
  Administered 2015-02-04 – 2015-02-06 (×3): 1 g via INTRAVENOUS
  Filled 2015-02-04 (×4): qty 10

## 2015-02-04 MED ORDER — ATORVASTATIN CALCIUM 10 MG PO TABS
20.0000 mg | ORAL_TABLET | Freq: Every day | ORAL | Status: DC
  Administered 2015-02-04 – 2015-02-07 (×4): 20 mg via ORAL
  Filled 2015-02-04 (×2): qty 2
  Filled 2015-02-04: qty 1
  Filled 2015-02-04 (×2): qty 2

## 2015-02-04 MED ORDER — ACETAMINOPHEN 325 MG PO TABS: 650.0000 mg | ORAL_TABLET | Freq: Four times a day (QID) | ORAL | Status: DC | PRN

## 2015-02-04 MED ORDER — SODIUM CHLORIDE 0.9 % IV SOLN
1000.0000 mL | INTRAVENOUS | Status: DC
  Administered 2015-02-04 – 2015-02-05 (×2): 1000 mL via INTRAVENOUS

## 2015-02-04 MED ORDER — ACETAMINOPHEN 325 MG PO TABS
650.0000 mg | ORAL_TABLET | Freq: Once | ORAL | Status: DC
  Administered 2015-02-04: 650 mg via ORAL
  Filled 2015-02-04: qty 2

## 2015-02-04 MED ORDER — INSULIN GLARGINE 100 UNIT/ML ~~LOC~~ SOLN
30.0000 [IU] | Freq: Every day | SUBCUTANEOUS | Status: DC
  Administered 2015-02-04 – 2015-02-07 (×4): 30 [IU] via SUBCUTANEOUS
  Filled 2015-02-04 (×4): qty 0.3

## 2015-02-04 MED ORDER — FENOFIBRATE 160 MG PO TABS
160.0000 mg | ORAL_TABLET | Freq: Every day | ORAL | Status: DC
  Administered 2015-02-04 – 2015-02-07 (×4): 160 mg via ORAL
  Filled 2015-02-04 (×5): qty 1

## 2015-02-04 MED ORDER — ONDANSETRON HCL 4 MG PO TABS: 4.0000 mg | ORAL_TABLET | Freq: Four times a day (QID) | ORAL | Status: DC | PRN

## 2015-02-04 MED ORDER — HYDROCODONE-ACETAMINOPHEN 5-325 MG PO TABS
1.0000 | ORAL_TABLET | ORAL | Status: DC | PRN
  Administered 2015-02-06 (×2): 1 via ORAL
  Filled 2015-02-04 (×2): qty 1

## 2015-02-04 MED ORDER — SERTRALINE HCL 100 MG PO TABS
100.0000 mg | ORAL_TABLET | Freq: Every day | ORAL | Status: DC
  Administered 2015-02-04 – 2015-02-08 (×5): 100 mg via ORAL
  Filled 2015-02-04 (×5): qty 1

## 2015-02-04 NOTE — ED Notes (Signed)
hospitalist at bedside

## 2015-02-04 NOTE — ED Provider Notes (Signed)
CSN: [id number]     Arrival date & time 02/04/15  1624 History   First MD Initiated Contact with Patient 02/04/15 1641     Chief Complaint  Patient presents with  . Chemo Card   . Weakness  . Fever      HPI Pt was seen at 1700. Per pt and his family, c/o gradual onset and persistence of constant "fever" for the past 2 days. Pt's family states pt "just told them today" about his fevers. Family at bedside "thinks" he was given tylenol approximately 1600 today, but is unsure. Has been associated with generalized weakness/fatigue and generalized body "aches." Denies N/V/D, no back pain, no abd pain, no CP/SOB, no cough, no rash. Pt has significant hx of CA with LD chemo 12/12/14.    Past Medical History  Diagnosis Date  . Hypertension   . Cancer of liver ([place])   . Kidney stone   . Prostate cancer ([place])   . Skin cancer   . Arthritis   . Anemia   . GERD (gastroesophageal reflux disease)   . Lymphoma ([place])   . [name]   . Sleep apnea   . Neuropathy in diabetes ([place])     Hx: of  . Diabetes mellitus     INSULIN DEPENDENT  . HX, PERSONAL, MALIGNANCY, PROSTATE 07/28/2006    Annotation: 2001, resected Qualifier: Diagnosis of  By: [name], [name]    . A-fib ([place])   . Near syncope 10/18/2013  . Memory deficit 10/18/2013  . HOH (hard of hearing)    Past Surgical History  Procedure Laterality Date  . Prostatectomy    . Kidney stone surgery    . Rotator cuff repair    . Ankle surgery    . Cardiac catheterization  09/16/96    Normal LV systolic function,dense ca+ prox. portion of the LAD w/50% narrowing in the distal portion, 30-40% irreg. in the proximal portion & 80% narrowing in the ostial portion of the posterolateral branch.  . Colonoscopy      Hx: of  . Infusion port  04/04/2013    RIGHT SUBCLAVIAN  . Cholecystectomy  04/04/2013  . Cholecystectomy N/A 04/04/2013    Procedure: LAPAROSCOPIC CHOLECYSTECTOMY WITH INTRAOPERATIVE CHOLANGIOGRAM;  [name],  MD;  Location: [facility];  Service: General;  [name];  . Portacath placement N/A 04/04/2013    Procedure: INSERTION PORT-A-CATH;  [name], MD;  Location: [facility] OR;  Service: General;  [name];   Family History  Problem Relation Age of Onset  . Heart disease Father   . Heart attack Father   . Cancer Sister   . Heart attack Brother   . Heart attack Brother   . Heart attack Brother   . Heart attack Brother   . Heart attack Brother   . Cancer Brother   . Cancer Brother    Social History  Substance Use Topics  . Smoking status: Former Smoker    Types: Pipe  . Smokeless tobacco: Former Systems developer    Types: Chew     Comment: [place] YEARS AGO "  . Alcohol Use: No    Review of Systems ROS: Statement: All systems negative except as marked or noted in the HPI; Constitutional: +fever and chills, generalized weakness/fatigue.. ; ; Eyes: Negative for eye pain, redness and discharge. ; ; ENMT: Negative for ear pain, hoarseness, nasal congestion, sinus pressure and sore throat. ; ; Cardiovascular: Negative for chest pain, palpitations, diaphoresis,  dyspnea and peripheral edema. ; ; Respiratory: Negative for cough, wheezing and stridor. ; ; Gastrointestinal: Negative for nausea, vomiting, diarrhea, abdominal pain, blood in stool, hematemesis, jaundice and rectal bleeding. . ; ; Genitourinary: Negative for dysuria, flank pain and hematuria. ; ; Musculoskeletal: Negative for back pain and neck pain. Negative for swelling and trauma.; ; Skin: Negative for pruritus, rash, abrasions, blisters, bruising and skin lesion.; ; Neuro: Negative for headache, lightheadedness and neck stiffness. Negative for altered level of consciousness , altered mental status, extremity weakness, paresthesias, involuntary movement, seizure and syncope.      Allergies  Niacin  Home Medications   Prior to Admission medications   Medication Sig Start Date End Date Taking? Authorizing Provider   allopurinol (ZYLOPRIM) 300 MG tablet TAKE 1 TABLET (300 MG TOTAL) BY MOUTH DAILY. 02/02/15  Yes [name], MD  amLODipine (NORVASC) 5 MG tablet Take 2.5 mg by mouth daily.    Yes Historical Provider, MD  atorvastatin (LIPITOR) 20 MG tablet Take 20 mg by mouth at bedtime.   Yes Historical Provider, MD  cetirizine (ZYRTEC) 10 MG tablet Take 10 mg by mouth daily.   Yes Historical Provider, MD  cholecalciferol (VITAMIN D) 1000 UNITS tablet Take 1,000 Units by mouth daily.   Yes Historical Provider, MD  cholestyramine Lucrezia Starch) 4 G packet Take 1 packet (4 g total) by mouth 3 (three) times daily with meals. Patient taking differently: Take 4 g by mouth 3 (three) times daily as needed (diarrhea).  05/02/14  Yes [name], MD  Cyanocobalamin (VITAMIN B 12 PO) Take 2,500 mg by mouth daily.    Yes Historical Provider, MD  fenofibrate 160 MG tablet Take 160 mg by mouth at bedtime.   Yes Historical Provider, MD  gabapentin (NEURONTIN) 300 MG capsule Take 300 mg by mouth 3 (three) times daily.    Yes Historical Provider, MD  Insulin Glargine (TOUJEO SOLOSTAR [place]) Inject 20-60 Units into the skin 2 (two) times daily as needed (decreased BS).    Yes Historical Provider, MD  lidocaine-prilocaine (EMLA) cream Apply 1 application topically as needed. Patient taking differently: Apply 1 application topically as needed (port).  06/27/14  Yes [name], MD  magnesium oxide (MAG-OX) 400 MG tablet Take 400 mg by mouth 2 (two) times daily.   Yes Historical Provider, MD  metFORMIN (GLUCOPHAGE) 1000 MG tablet Take 1,000 mg by mouth 2 (two) times daily with a meal.    Yes Historical Provider, MD  Multiple Vitamins-Minerals (MULTIVITAMIN WITH MINERALS) tablet Take 1 tablet by mouth daily.   Yes Historical Provider, MD  PRESCRIPTION MEDICATION Chemo at [facility]   Yes Historical Provider, MD  Rivaroxaban (XARELTO) 15 MG TABS tablet Take 15 mg by mouth every evening.    Yes Historical Provider, MD  sertraline  (ZOLOFT) 100 MG tablet Take 100 mg by mouth daily.   Yes Historical Provider, MD  vitamin C (ASCORBIC ACID) 500 MG tablet Take 500 mg by mouth daily.   Yes Historical Provider, MD   BP 114/60 mmHg  Pulse 92  Temp(Src) 102.5 F (39.2 C) (Rectal)  Resp 18  Wt 180 lb (81.647 kg)  SpO2 96%   Filed Vitals:   02/04/15 1730 02/04/15 1802 02/04/15 1803 02/04/15 1805  BP: 121/50   110/50  Pulse: 86   85  Temp:      TempSrc:      Resp: 19   16  Weight:      SpO2:  91% 93%  Physical Exam  1705: Physical examination:  Nursing notes reviewed; Vital signs and O2 SAT reviewed; +febrile.;; Constitutional: Well developed, Well nourished, Well hydrated, In no acute distress; Head:  Normocephalic, atraumatic; Eyes: EOMI, PERRL, No scleral icterus. +left lateral sclera with subconjunctival hemorrhage.; ENMT: Mouth and pharynx normal, Mucous membranes moist; Neck: Supple, Full range of motion, No lymphadenopathy; Cardiovascular: Regular rate and rhythm, No gallop; Respiratory: Breath sounds clear & equal bilaterally, No wheezes.  Speaking full sentences with ease, Normal respiratory effort/excursion; Chest: Nontender, Movement normal; Abdomen: Soft, Nontender, Nondistended, Normal bowel sounds; Genitourinary: No CVA tenderness; Extremities: Pulses normal, No tenderness, No edema, No calf edema or asymmetry.; Neuro: AA&Ox3, Major CN grossly intact.  Speech clear. No gross focal motor or sensory deficits in extremities.; Skin: Color normal, Warm, Dry.   ED Course  Procedures (including critical care time) Labs Review   Imaging Review  I have personally reviewed and evaluated these images and lab results as part of my medical decision-making.   EKG Interpretation   Date/Time:  Wednesday February 04 2015 16:55:51 EDT Ventricular Rate:  92 PR Interval:  178 QRS Duration: 79 QT Interval:  323 QTC Calculation: 399 R Axis:   38 Text Interpretation:  Sinus rhythm Low voltage, precordial leads   Borderline T abnormalities, anterior leads When compared with ECG of  08/16/2013 QT has shortened Otherwise no significant change Confirmed by  [facility]  MD, Nunzio Cory [phone]) on 02/04/2015 5:24:27 PM      MDM  MDM Reviewed: previous chart, nursing note and vitals Reviewed previous: labs and ECG Interpretation: labs, ECG and x-ray Total time providing critical care: 30-74 minutes. This excludes time spent performing separately reportable procedures and services. Consults: admitting MD     CRITICAL CARE Performed by: Alfonzo Feller Total critical care time: 35 Critical care time was exclusive of separately billable procedures and treating other patients. Critical care was necessary to treat or prevent imminent or life-threatening deterioration. Critical care was time spent personally by me on the following activities: development of treatment plan with patient and/or surrogate as well as nursing, discussions with consultants, evaluation of patient's response to treatment, examination of patient, obtaining history from patient or surrogate, ordering and performing treatments and interventions, ordering and review of laboratory studies, ordering and review of radiographic studies, pulse oximetry and re-evaluation of patient's condition.   Results for orders placed or performed during the hospital encounter of 02/04/15  Comprehensive metabolic panel  Result Value Ref Range   Sodium 129 (L) 135 - 145 mmol/L   Potassium 4.3 3.5 - 5.1 mmol/L   Chloride 103 101 - 111 mmol/L   CO2 19 (L) 22 - 32 mmol/L   Glucose, Bld 176 (H) 65 - 99 mg/dL   BUN 46 (H) 6 - 20 mg/dL   Creatinine, Ser 2.78 (H) 0.61 - 1.24 mg/dL   Calcium 8.9 8.9 - 10.3 mg/dL   Total Protein 7.2 6.5 - 8.1 g/dL   Albumin 4.0 3.5 - 5.0 g/dL   AST 30 15 - 41 U/L   ALT 26 17 - 63 U/L   Alkaline Phosphatase 57 38 - 126 U/L   Total Bilirubin 0.9 0.3 - 1.2 mg/dL   GFR calc non Af Amer 20 (L) >60 mL/min   GFR calc Af Amer 23  (L) >60 mL/min   Anion gap 7 5 - 15  CBC WITH DIFFERENTIAL  Result Value Ref Range   WBC 7.1 4.0 - 10.5 K/uL   RBC 3.37 (L) 4.22 - 5.81  MIL/uL   Hemoglobin 9.8 (L) 13.0 - 17.0 g/dL   HCT 29.6 (L) 39.0 - 52.0 %   MCV 87.8 78.0 - 100.0 fL   MCH 29.1 26.0 - 34.0 pg   MCHC 33.1 30.0 - 36.0 g/dL   RDW 15.2 11.5 - 15.5 %   Platelets 134 (L) 150 - 400 K/uL   Neutrophils Relative % 90 %   Neutro Abs 6.3 1.7 - 7.7 K/uL   Lymphocytes Relative 4 %   Lymphs Abs 0.3 (L) 0.7 - 4.0 K/uL   Monocytes Relative 6 %   Monocytes Absolute 0.5 0.1 - 1.0 K/uL   Eosinophils Relative 0 %   Eosinophils Absolute 0.0 0.0 - 0.7 K/uL   Basophils Relative 0 %   Basophils Absolute 0.0 0.0 - 0.1 K/uL  Urinalysis, Routine w reflex microscopic (not at [facility])  Result Value Ref Range   Color, Urine YELLOW YELLOW   APPearance TURBID (A) CLEAR   Specific Gravity, Urine 1.017 1.005 - 1.030   pH 5.5 5.0 - 8.0   Glucose, UA 250 (A) NEGATIVE mg/dL   Hgb urine dipstick LARGE (A) NEGATIVE   Bilirubin Urine NEGATIVE NEGATIVE   Ketones, ur NEGATIVE NEGATIVE mg/dL   Protein, ur 100 (A) NEGATIVE mg/dL   Urobilinogen, UA 0.2 0.0 - 1.0 mg/dL   Nitrite POSITIVE (A) NEGATIVE   Leukocytes, UA LARGE (A) NEGATIVE  Troponin I  Result Value Ref Range   Troponin I <0.03 <0.031 ng/mL  Urine microscopic-add on  Result Value Ref Range   Squamous Epithelial / LPF RARE RARE   WBC, UA TOO NUMEROUS TO COUNT <3 WBC/hpf   RBC / HPF FIELD OBSCURED BY WBC'S <3 RBC/hpf   Bacteria, UA FEW (A) RARE  I-Stat CG4 Lactic Acid, ED  (not at  [facility])  Result Value Ref Range   Lactic Acid, Venous 1.21 0.5 - 2.0 mmol/L   Dg Chest 2 View 02/04/2015  CLINICAL DATA:  Fever. EXAM: CHEST  2 VIEW COMPARISON:  Chest x-rays dated 08/16/2013 and 06/18/2013 FINDINGS: Power port is in good position. Heart size and pulmonary vascularity are normal. Lungs are clear. No acute osseous abnormality. IMPRESSION: No active cardiopulmonary disease. Electronically Signed    By: Lorriane Shire M.D.   On: 02/04/2015 17:51   Results for MCKADEN, DELON (MRN [id number]) as of 02/04/2015 18:10  Ref. Range 08/22/2014 08:16 10/17/2014 09:05 12/12/2014 08:30 02/04/2015 16:58  Sodium Latest Ref Range: 135-145 mmol/L 140 138 137 129 (L)  BUN Latest Ref Range: 6-20 mg/dL 19.7 22.1 24.5 46 (H)  Creatinine Latest Ref Range: 0.61-1.24 mg/dL 1.4 (H) 1.6 (H) 2.0 (H) 2.78 (H)     1825:  New hyponatremia on labs today. BUN/Cr gradually increasing from baseline over the past 4-6 months. Judicious IVF given. +UTI, UC and BCx2 pending; will dose IV rocephin.  Dx and testing d/w pt and family.  Questions answered.  Verb understanding, agreeable to admit.  T/C to [facility] Dr. Roel Cluck, case discussed, including:  HPI, pertinent PM/SHx, VS/PE, dx testing, ED course and treatment:  Agreeable to admit, requests to write temporary orders, obtain medical bed to team [facility].   Francine Graven, DO 02/07/15 [phone]

## 2015-02-04 NOTE — ED Notes (Addendum)
see triage note. currently being treated for liver cancer and lymphoma, prostate removed years ago and now PSA level 31, 2 nodules found, 1 near upper bladder and 1 "looked like he had regrown a prostate". urinating blood last week. pt reports fever x2 days. denies pain. family reports pt is very stoic and will not tell them when he is sick. pt denies cough or dysuria.  Pt oxygen level occasionally has dropped to 91%, rn asked pt if he has been SOB recently. Pt reported he has had intermittent SOB for a few months, family did not know anything about this.   Family reports pt is on xarelto for afib, and on Sunday 10/9 pt did not have blood shot eyes, now pts outer left eye is very blood shot.   Pt had clean UA on 10/5 at Dr Burna Sis office, pts creatinine level on 10/5 was 2.35  Pt is scheduled to have renal US tomorrow 10/13, and chemo 10/14, and surgery on 10/21

## 2015-02-04 NOTE — ED Notes (Signed)
Pt is CA pt.  Pt has been feeling weak, fever, body pain today.  Fever 102.6.  Tylenol given about 1610 today.

## 2015-02-04 NOTE — Telephone Encounter (Signed)
Writer called and spoke with patient's daughter.  She states that patient was fine yesterday and today started feeling terrible and has over 102 fever.  She called patient's PCP who encouraged her to take patient to the ED.  Patient was so weak that he was unable to ambulate to the car.  Daughter called ambulance and went ahead and got to the ED before the ambulance and registered patient.  While speaking with her ambulance was just pulling up. Dr. Jana Hakim aware.

## 2015-02-04 NOTE — H&P (Addendum)
PCP:  Horatio Pel, MD  Oncology [facility]  Referring provider [place]   Chief Complaint:  fever  HPI: [name] is a 81 y.o. male   has a past medical history of Hypertension; Cancer of liver ([place]); Kidney stone; Prostate cancer ([place]); Skin cancer; Arthritis; Anemia; GERD (gastroesophageal reflux disease); Lymphoma ([place]); [name]; Neuropathy in diabetes ([place]); Diabetes mellitus; HX, PERSONAL, MALIGNANCY, PROSTATE (07/28/2006); A-fib ([place]); Near syncope (10/18/2013); Memory deficit (10/18/2013); HOH (hard of hearing); and Sleep apnea.   Presented with  Patient was found at home febrile up to 102.5 he was not telling his family. Denies any cough,  Endorses some myalgias. Denies any sick contacts. no burning with urination. Patient  endorses some hematuria. He has hx of prostate cancer followed by urology with likely recurrence he was supposed to have a Renal US done tomorrow.  In ER patient was noted to be transiently hypoxic down to 91%. He is not on home oxygen. Patient has occasional swelling of his right leg ever since he had surgery on it. Otherwise no travel history no sudden onset of chest pain or shortness of breath. Reports for the past 2 months he has occasional shortness of breath  and dyspnea with exertion.    Has hx of smoking but very minimal for total of 3-4 months. He does have history of heart failure for the last echogram was done in 2014 showing reduced EF of 50-55 percent and evidence of grade 1 diastolic dysfunction.   He was found to be febrile up to 102.5.  In ER was found likely  to have UTI. Chest x-ray was  unremarkable. also he was found to have Na of 129 and rising Cr. Up to 2.78 from baseline of 1.6 reports decreased PO intake.  Of note patient has history of non-Hodgkin's lymphoma currently on maintenance rituximab with known spread to the liver and spleen. He is followed by Dr. Joycelyn Schmid not and was  planning to recieve his  Chemotherapy therapy next week last treatment was on August.  patient also has history of prostate cancer patient's and his daughter states that he was told that he had some recurrence in the prostatic area as well as possible lesion involving one of the kidneys. He is scheduled to undergo renal ultrasound tomorrow to follow this up. Patient has been followed by urology. Has hx of sleep apnea on CPAP   examination also has history of atrial fibrillation he has been on xarelto for aticoagulation. Reports occasional hematuria passing clots in the past week. Has continuous subcutaneous tidal hemorrhage. Of note she can creatinine has been rising. He splits his count continues to decline.    Hospitalist was called for admission for Febrile illness  Review of Systems:    Pertinent positives include: Fevers, chills, fatigue, shortness of breath at rest.  dyspnea on exertion  Constitutional:  No weight loss, night sweats,  weight loss  HEENT:  No headaches, Difficulty swallowing,Tooth/dental problems,Sore throat,  No sneezing, itching, ear ache, nasal congestion, post nasal drip,  Cardio-vascular:  No chest pain, Orthopnea, PND, anasarca, dizziness, palpitations.no Bilateral lower extremity swelling  GI:  No heartburn, indigestion, abdominal pain, nausea, vomiting, diarrhea, change in bowel habits, loss of appetite, melena, blood in stool, hematemesis Resp:  no , No excess mucus, no productive cough, No non-productive cough, No coughing up of blood.No change in color of mucus.No wheezing. Skin:  no rash or lesions. No jaundice GU:  no dysuria, change in  color of urine, no urgency or frequency. No straining to urinate.  No flank pain.  Musculoskeletal:  No joint pain or no joint swelling. No decreased range of motion. No back pain.  Psych:  No change in mood or affect. No depression or anxiety. No memory loss.  Neuro: no localizing neurological complaints, no  tingling, no weakness, no double vision, no gait abnormality, no slurred speech, no confusion  Otherwise ROS are negative except for above, 10 systems were reviewed  Past Medical History: Past Medical History  Diagnosis Date  . Hypertension   . Cancer of liver ([place])   . Kidney stone   . Prostate cancer ([place])   . Skin cancer   . Arthritis   . Anemia   . GERD (gastroesophageal reflux disease)   . Lymphoma ([place])   . [name]   . Neuropathy in diabetes ([place])     Hx: of  . Diabetes mellitus     INSULIN DEPENDENT  . HX, PERSONAL, MALIGNANCY, PROSTATE 07/28/2006    Annotation: 2001, resected Qualifier: Diagnosis of  By: [name], [name]    . A-fib ([place])   . Near syncope 10/18/2013  . Memory deficit 10/18/2013  . HOH (hard of hearing)   . Sleep apnea     on CPAP    Past Surgical History  Procedure Laterality Date  . Prostatectomy    . Kidney stone surgery    . Rotator cuff repair    . Ankle surgery    . Cardiac catheterization  09/16/96    Normal LV systolic function,dense ca+ prox. portion of the LAD w/50% narrowing in the distal portion, 30-40% irreg. in the proximal portion & 80% narrowing in the ostial portion of the posterolateral branch.  . Colonoscopy      Hx: of  . Infusion port  04/04/2013    RIGHT SUBCLAVIAN  . Cholecystectomy  04/04/2013  . Cholecystectomy N/A 04/04/2013    Procedure: LAPAROSCOPIC CHOLECYSTECTOMY WITH INTRAOPERATIVE CHOLANGIOGRAM;  [name], MD;  Location: [facility];  Service: General;  [name];  . Portacath placement N/A 04/04/2013    Procedure: INSERTION PORT-A-CATH;  [name], MD;  Location: [facility];  Service: General;  [name];     Medications: Prior to Admission medications   Medication Sig Start Date End Date Taking? Authorizing Provider  allopurinol (ZYLOPRIM) 300 MG tablet TAKE 1 TABLET (300 MG TOTAL) BY MOUTH DAILY. 02/02/15  Yes [name], MD  amLODipine (NORVASC) 5 MG tablet  Take 2.5 mg by mouth daily.    Yes Historical Provider, MD  atorvastatin (LIPITOR) 20 MG tablet Take 20 mg by mouth at bedtime.   Yes Historical Provider, MD  cetirizine (ZYRTEC) 10 MG tablet Take 10 mg by mouth daily.   Yes Historical Provider, MD  cholecalciferol (VITAMIN D) 1000 UNITS tablet Take 1,000 Units by mouth daily.   Yes Historical Provider, MD  cholestyramine Lucrezia Starch) 4 G packet Take 1 packet (4 g total) by mouth 3 (three) times daily with meals. Patient taking differently: Take 4 g by mouth 3 (three) times daily as needed (diarrhea).  05/02/14  Yes [name], MD  Cyanocobalamin (VITAMIN B 12 PO) Take 2,500 mg by mouth daily.    Yes Historical Provider, MD  fenofibrate 160 MG tablet Take 160 mg by mouth at bedtime.   Yes Historical Provider, MD  gabapentin (NEURONTIN) 300 MG capsule Take 300 mg by mouth 3 (three) times daily.  Yes Historical Provider, MD  Insulin Glargine (TOUJEO SOLOSTAR [place]) Inject 60 Units into the skin daily at 6 (six) AM.    Yes Historical Provider, MD  lidocaine-prilocaine (EMLA) cream Apply 1 application topically as needed. Patient taking differently: Apply 1 application topically as needed (port).  06/27/14  Yes [name], MD  magnesium oxide (MAG-OX) 400 MG tablet Take 400 mg by mouth 2 (two) times daily.   Yes Historical Provider, MD  metFORMIN (GLUCOPHAGE) 1000 MG tablet Take 1,000 mg by mouth 2 (two) times daily with a meal.    Yes Historical Provider, MD  Multiple Vitamins-Minerals (MULTIVITAMIN WITH MINERALS) tablet Take 1 tablet by mouth daily.   Yes Historical Provider, MD  PRESCRIPTION MEDICATION Chemo at [facility]   Yes Historical Provider, MD  Rivaroxaban (XARELTO) 15 MG TABS tablet Take 15 mg by mouth every evening.    Yes Historical Provider, MD  sertraline (ZOLOFT) 100 MG tablet Take 100 mg by mouth daily.   Yes Historical Provider, MD  vitamin C (ASCORBIC ACID) 500 MG tablet Take 500 mg by mouth daily.   Yes Historical Provider, MD     Allergies:   Allergies  Allergen Reactions  . Niacin Other (See Comments)    headaches    Social History:  Ambulatory   independently   Lives at home alone,     reports that he has quit smoking. His smoking use included Pipe. He has quit using smokeless tobacco. His smokeless tobacco use included Chew. He reports that he does not drink alcohol or use illicit drugs.    Family History: family history includes Cancer in his brother, brother, and sister; Heart attack in his brother, brother, brother, brother, brother, and father; Heart disease in his father.    Physical Exam: Patient Vitals for the past 24 hrs:  BP Temp Temp src Pulse Resp SpO2 Weight  02/04/15 1825 - 102 F (38.9 C) Rectal 82 16 95 % -  02/04/15 1815 (!) 102/48 mmHg - - 83 21 - -  02/04/15 1805 (!) 110/50 mmHg - - 85 16 - -  02/04/15 1803 - - - - - 93 % -  02/04/15 1802 - - - - - 91 % -  02/04/15 1730 (!) 121/50 mmHg - - 86 19 - -  02/04/15 1725 (!) 118/51 mmHg - - 86 22 94 % -  02/04/15 1709 - - - - - - 81.647 kg (180 lb)  02/04/15 1655 114/60 mmHg - - 93 25 - -  02/04/15 1654 114/60 mmHg 102.5 F (39.2 C) Rectal 92 18 96 % -    1. General:  in No Acute distress 2. Psychological: Alert and  Oriented 3. Head/ENT:      Dry Mucous Membranes                          Head Non traumatic, neck supple                            Poor Dentition                           left eye with subconjunctival hemorrhage  4. SKIN:   decreased Skin turgor,  Skin clean Dry and intact no rash 5. Heart: Regular rate and rhythm no Murmur, Rub or gallop 6. Lungs: no wheezes or crackles   7. Abdomen: Soft, non-tender,  Non distended 8. Lower extremities: no clubbing, cyanosis, or edema 9. Neurologically Grossly intact, moving all 4 extremities equally 10. MSK: Normal range of motion  body mass index is 28.19 kg/(m^2).   Labs on Admission:   Results for orders placed or performed during the hospital encounter of  02/04/15 (from the past 24 hour(s))  Urinalysis, Routine w reflex microscopic (not at [facility])     Status: Abnormal   Collection Time: 02/04/15  4:49 PM  Result Value Ref Range   Color, Urine YELLOW YELLOW   APPearance TURBID (A) CLEAR   Specific Gravity, Urine 1.017 1.005 - 1.030   pH 5.5 5.0 - 8.0   Glucose, UA 250 (A) NEGATIVE mg/dL   Hgb urine dipstick LARGE (A) NEGATIVE   Bilirubin Urine NEGATIVE NEGATIVE   Ketones, ur NEGATIVE NEGATIVE mg/dL   Protein, ur 100 (A) NEGATIVE mg/dL   Urobilinogen, UA 0.2 0.0 - 1.0 mg/dL   Nitrite POSITIVE (A) NEGATIVE   Leukocytes, UA LARGE (A) NEGATIVE  Urine microscopic-add on     Status: Abnormal   Collection Time: 02/04/15  4:49 PM  Result Value Ref Range   Squamous Epithelial / LPF RARE RARE   WBC, UA TOO NUMEROUS TO COUNT <3 WBC/hpf   RBC / HPF FIELD OBSCURED BY WBC'S <3 RBC/hpf   Bacteria, UA FEW (A) RARE  Comprehensive metabolic panel     Status: Abnormal   Collection Time: 02/04/15  4:58 PM  Result Value Ref Range   Sodium 129 (L) 135 - 145 mmol/L   Potassium 4.3 3.5 - 5.1 mmol/L   Chloride 103 101 - 111 mmol/L   CO2 19 (L) 22 - 32 mmol/L   Glucose, Bld 176 (H) 65 - 99 mg/dL   BUN 46 (H) 6 - 20 mg/dL   Creatinine, Ser 2.78 (H) 0.61 - 1.24 mg/dL   Calcium 8.9 8.9 - 10.3 mg/dL   Total Protein 7.2 6.5 - 8.1 g/dL   Albumin 4.0 3.5 - 5.0 g/dL   AST 30 15 - 41 U/L   ALT 26 17 - 63 U/L   Alkaline Phosphatase 57 38 - 126 U/L   Total Bilirubin 0.9 0.3 - 1.2 mg/dL   GFR calc non Af Amer 20 (L) >60 mL/min   GFR calc Af Amer 23 (L) >60 mL/min   Anion gap 7 5 - 15  CBC WITH DIFFERENTIAL     Status: Abnormal   Collection Time: 02/04/15  4:58 PM  Result Value Ref Range   WBC 7.1 4.0 - 10.5 K/uL   RBC 3.37 (L) 4.22 - 5.81 MIL/uL   Hemoglobin 9.8 (L) 13.0 - 17.0 g/dL   HCT 29.6 (L) 39.0 - 52.0 %   MCV 87.8 78.0 - 100.0 fL   MCH 29.1 26.0 - 34.0 pg   MCHC 33.1 30.0 - 36.0 g/dL   RDW 15.2 11.5 - 15.5 %   Platelets 134 (L) 150 - 400 K/uL    Neutrophils Relative % 90 %   Neutro Abs 6.3 1.7 - 7.7 K/uL   Lymphocytes Relative 4 %   Lymphs Abs 0.3 (L) 0.7 - 4.0 K/uL   Monocytes Relative 6 %   Monocytes Absolute 0.5 0.1 - 1.0 K/uL   Eosinophils Relative 0 %   Eosinophils Absolute 0.0 0.0 - 0.7 K/uL   Basophils Relative 0 %   Basophils Absolute 0.0 0.0 - 0.1 K/uL  Troponin I     Status: None   Collection Time: 02/04/15  4:58 PM  Result Value Ref Range   Troponin I <0.03 <0.031 ng/mL  I-Stat CG4 Lactic Acid, ED  (not at  [facility])     Status: None   Collection Time: 02/04/15  5:11 PM  Result Value Ref Range   Lactic Acid, Venous 1.21 0.5 - 2.0 mmol/L    UA WBC too numerous to count few bacteria, Pos nitrites  Lab Results  Component Value Date   HGBA1C 6.4* 08/16/2013    Estimated Creatinine Clearance: 21.7 mL/min (by C-G formula based on Cr of 2.78).  BNP (last 3 results) No results for input(s): PROBNP in the last 8760 hours.  Other results:  I have pearsonaly reviewed this: ECG REPORT  Rate:92  Rhythm: NSR  ST&T Change: no ischemic changes QTC 399   Filed Weights   02/04/15 1709  Weight: 81.647 kg (180 lb)     Cultures: No results found for: SDES, SPECREQUEST, CULT, REPTSTATUS   Radiological Exams on Admission: Dg Chest 2 View  02/04/2015  CLINICAL DATA:  Fever. EXAM: CHEST  2 VIEW COMPARISON:  Chest x-rays dated 08/16/2013 and 06/18/2013 FINDINGS: Power port is in good position. Heart size and pulmonary vascularity are normal. Lungs are clear. No acute osseous abnormality. IMPRESSION: No active cardiopulmonary disease. Electronically Signed   By: Lorriane Shire M.D.   On: 02/04/2015 17:51    Chart has been reviewed  Family   at  Bedside  plan of care was discussed with Daughter [name] [id number]  Assessment/Plan  81 year old gentleman onset multiple medical problems including non-Hodgkin's lymphoma with spread to liver and lymph nodes, thrombocytopenia, combined systolic and diastolic heart  failure via 4 50-55 percent, chronic kidney disease based on current 1.6, prostate cancer with likely recurrence and diabetes mellitus on chronic insulin here with febrile illness for past 2 days was found to have hyponatremia and worsening renal function Present on Admission:  . Fever - source unclear possibly urinary tract infection although influenza could not be ruled out. Patient did state that he received vaccination this year. We'll obtain an influenza PCR. We'll now obtain urine culture and Treat with  Rocephin  . NHL (non-Hodgkin's lymphoma) ([place]) - we'll need to discuss with oncology given ongoing fever he may not be evident tolerate chemotherapy  . Anemia in neoplastic disease obtain Hemoccult stool currently slightly below baseline of 11 patient has history of anemia of chronic disease  . Thrombocytopenia, unspecified ([place]) - currently stable at this is chronic condition. Likely multifactorial at some point patient has been diagnosed cirrhosis. He also has multiple liver and spleen lesions consistent with lymphoma and has been on chemotherapy in the past  . DM (diabetes mellitus) type 2, uncontrolled, with ketoacidosis ([place]) - will switch to Lantus while inpatient but decrease dose to 30 units. Monitor and cover with sliding scale  . Paroxysmal atrial fibrillation (HCC) - currently appears to be in sinus rhythm. Patient has been on anticoagulation but given recent extensive hematuria with clots as well as worsening anemia as well as worsening renal function is around 2 is likely not a good choice. He will need evaluation for potential is risks. All is he lives at home alone. He has chronic thrombocytopenia. His factors should be considered in seeing if he would be a good candidate for continuous anticoagulation  . Prostate cancer ([place]) - chronic. Given worsening renal function and hematuria will obtain renal ultrasound. Patient will need to follow up with urology to evaluate if he will need to  initiate therapy for possibly  recurrent prostatic cancer  . CKD (chronic kidney disease) stage 3, GFR 30-59 ml/min currently worse. Patient appears to be dehydrated we'll administer IV fluids obtain urine electrolytes  . Hyponatremia most likely secondary to dehydration will obtain urine electrolytes check TSH  . Dehydration obtain orthostatics administer fluids being mindful if patient has history of combined diastolic and systolic heart failure  . UTI (lower urinary tract infection). Call Rocephin await results of urine culture  . Acute on chronic renal failure (HCC) likely secondary to dehydration will rehydrate him full creatinine obtain renal ultrasound to see is no hydronephrosis  . Chronic combined systolic and diastolic heart failure, NYHA class 1 (HCC) currently appears to be fluid down will give gentle fluids repeat echo gram since patient have had some dyspnea which has been worsening of past few months  Transient hypotension ER -family states at baseline systolic blood pressure low 100s will hold Norvasc and give gentle fluid   Prophylaxis: Eldridge Abrahams  CODE STATUS:  DNR/DNI as per patient    Disposition:  To home once workup is complete and patient is stable, family would prefer for him to go back home  Other plan as per orders.  I have spent a total of 34min on this admission spoke with Dr.Kain with oncology  [place] 02/04/2015, 6:47 PM  [facility]  Pager [phone]   after 2 AM please page floor coverage PA If 7AM-7PM, please contact the day team taking care of the patient  [email or web address]  Password [email or web address]

## 2015-02-04 NOTE — Telephone Encounter (Signed)
Received VM message from pt's daughter@ 3:55 pm stating that pt had a fever of 102.5, c/o headache, neck and shoulder pain, aching all over.. TC back to daughter, [name].  She states they are on their way to the [facility] ED. She states her father is weak, does not feel well at all.  Will pass message on to Dr. Jana Hakim.

## 2015-02-04 NOTE — Progress Notes (Signed)
ANTIBIOTIC CONSULT NOTE - INITIAL  Pharmacy Consult for Rocephin Indication: UTI  Allergies  Allergen Reactions  . Niacin Other (See Comments)    headaches    Patient Measurements: Weight: 180 lb (81.647 kg)  Vital Signs: Temp: 102 F (38.9 C) (10/12 1825) Temp Source: Rectal (10/12 1825) BP: 102/48 mmHg (10/12 1815) Pulse Rate: 82 (10/12 1825) Intake/Output from previous day:   Intake/Output from this shift:    Labs:  Recent Labs  02/04/15 1658  WBC 7.1  HGB 9.8*  PLT 134*  CREATININE 2.78*   Estimated Creatinine Clearance: 21.7 mL/min (by C-G formula based on Cr of 2.78). No results for input(s): VANCOTROUGH, VANCOPEAK, VANCORANDOM, GENTTROUGH, GENTPEAK, GENTRANDOM, TOBRATROUGH, TOBRAPEAK, TOBRARND, AMIKACINPEAK, AMIKACINTROU, AMIKACIN in the last 72 hours.   Microbiology: No results found for this or any previous visit (from the past 720 hour(s)).  Medical History: Past Medical History  Diagnosis Date  . Hypertension   . Cancer of liver ([place])   . Kidney stone   . Prostate cancer ([place])   . Skin cancer   . Arthritis   . Anemia   . GERD (gastroesophageal reflux disease)   . Lymphoma ([place])   . [name]   . Sleep apnea   . Neuropathy in diabetes ([place])     Hx: of  . Diabetes mellitus     INSULIN DEPENDENT  . HX, PERSONAL, MALIGNANCY, PROSTATE 07/28/2006    Annotation: 2001, resected Qualifier: Diagnosis of  By: [name], [name]    . A-fib ([place])   . Near syncope 10/18/2013  . Memory deficit 10/18/2013  . HOH (hard of hearing)     Medications:  Anti-infectives    None     Assessment: 7 yoM with PMH of NHL on active chemo, prostate and liver cancer, DM, Afib presents with persistent fever and weakness x 2 days.  UA positive for UTI; pharmacy to dose Rocephin  Goal of Therapy:  Eradication of infection Appropriate antibiotic dosing for indication and renal function  Plan:   Rocephin 1g IV q24 hr  Need for further dosage adjustment  appears unlikely at present.  Will sign off at this time.  Please reconsult if a change in clinical status warrants re-evaluation of dosage.   Reuel Boom, PharmD, BCPS Pager: [phone] 02/04/2015, 6:38 PM

## 2015-02-05 ENCOUNTER — Encounter (HOSPITAL_COMMUNITY)
Admission: EM | Disposition: A | Discharge status: Home / Self Care | Payer: Self-pay | Source: Home / Self Care | Attending: Internal Medicine

## 2015-02-05 ENCOUNTER — Ambulatory Visit: Payer: Self-pay | Admitting: Urology

## 2015-02-05 ENCOUNTER — Observation Stay (HOSPITAL_COMMUNITY): Payer: Medicare Other

## 2015-02-05 ENCOUNTER — Ambulatory Visit (HOSPITAL_COMMUNITY): Admission: RE | Admit: 2015-02-05 | Payer: Medicare Other | Source: Ambulatory Visit

## 2015-02-05 ENCOUNTER — Inpatient Hospital Stay (HOSPITAL_COMMUNITY): Payer: Medicare Other | Admitting: Certified Registered"

## 2015-02-05 ENCOUNTER — Telehealth: Payer: Self-pay | Admitting: Oncology

## 2015-02-05 ENCOUNTER — Other Ambulatory Visit: Payer: Self-pay

## 2015-02-05 ENCOUNTER — Encounter (HOSPITAL_COMMUNITY): Payer: Self-pay | Admitting: Certified Registered Nurse Anesthetist

## 2015-02-05 DIAGNOSIS — R0600 Dyspnea, unspecified: Secondary | ICD-10-CM | POA: Diagnosis not present

## 2015-02-05 DIAGNOSIS — Z8249 Family history of ischemic heart disease and other diseases of the circulatory system: Secondary | ICD-10-CM | POA: Diagnosis not present

## 2015-02-05 DIAGNOSIS — Z23 Encounter for immunization: Secondary | ICD-10-CM | POA: Diagnosis not present

## 2015-02-05 DIAGNOSIS — Z8505 Personal history of malignant neoplasm of liver: Secondary | ICD-10-CM | POA: Diagnosis not present

## 2015-02-05 DIAGNOSIS — N1330 Unspecified hydronephrosis: Secondary | ICD-10-CM | POA: Diagnosis not present

## 2015-02-05 DIAGNOSIS — H9190 Unspecified hearing loss, unspecified ear: Secondary | ICD-10-CM | POA: Diagnosis not present

## 2015-02-05 DIAGNOSIS — I480 Paroxysmal atrial fibrillation: Secondary | ICD-10-CM | POA: Diagnosis not present

## 2015-02-05 DIAGNOSIS — D696 Thrombocytopenia, unspecified: Secondary | ICD-10-CM

## 2015-02-05 DIAGNOSIS — N3289 Other specified disorders of bladder: Secondary | ICD-10-CM | POA: Diagnosis not present

## 2015-02-05 DIAGNOSIS — E871 Hypo-osmolality and hyponatremia: Secondary | ICD-10-CM | POA: Diagnosis not present

## 2015-02-05 DIAGNOSIS — Z7901 Long term (current) use of anticoagulants: Secondary | ICD-10-CM | POA: Diagnosis not present

## 2015-02-05 DIAGNOSIS — N189 Chronic kidney disease, unspecified: Secondary | ICD-10-CM

## 2015-02-05 DIAGNOSIS — G4730 Sleep apnea, unspecified: Secondary | ICD-10-CM | POA: Diagnosis not present

## 2015-02-05 DIAGNOSIS — C61 Malignant neoplasm of prostate: Secondary | ICD-10-CM | POA: Diagnosis not present

## 2015-02-05 DIAGNOSIS — N183 Chronic kidney disease, stage 3 (moderate): Secondary | ICD-10-CM

## 2015-02-05 DIAGNOSIS — Z85828 Personal history of other malignant neoplasm of skin: Secondary | ICD-10-CM | POA: Diagnosis not present

## 2015-02-05 DIAGNOSIS — M1990 Unspecified osteoarthritis, unspecified site: Secondary | ICD-10-CM | POA: Diagnosis not present

## 2015-02-05 DIAGNOSIS — Z87442 Personal history of urinary calculi: Secondary | ICD-10-CM | POA: Diagnosis not present

## 2015-02-05 DIAGNOSIS — N390 Urinary tract infection, site not specified: Secondary | ICD-10-CM

## 2015-02-05 DIAGNOSIS — R338 Other retention of urine: Secondary | ICD-10-CM | POA: Diagnosis not present

## 2015-02-05 DIAGNOSIS — N138 Other obstructive and reflux uropathy: Secondary | ICD-10-CM | POA: Diagnosis present

## 2015-02-05 DIAGNOSIS — N135 Crossing vessel and stricture of ureter without hydronephrosis: Secondary | ICD-10-CM | POA: Diagnosis not present

## 2015-02-05 DIAGNOSIS — D494 Neoplasm of unspecified behavior of bladder: Secondary | ICD-10-CM | POA: Diagnosis not present

## 2015-02-05 DIAGNOSIS — I5042 Chronic combined systolic (congestive) and diastolic (congestive) heart failure: Secondary | ICD-10-CM | POA: Diagnosis present

## 2015-02-05 DIAGNOSIS — Z888 Allergy status to other drugs, medicaments and biological substances status: Secondary | ICD-10-CM | POA: Diagnosis not present

## 2015-02-05 DIAGNOSIS — D630 Anemia in neoplastic disease: Secondary | ICD-10-CM

## 2015-02-05 DIAGNOSIS — Z79899 Other long term (current) drug therapy: Secondary | ICD-10-CM | POA: Diagnosis not present

## 2015-02-05 DIAGNOSIS — K7460 Unspecified cirrhosis of liver: Secondary | ICD-10-CM | POA: Diagnosis present

## 2015-02-05 DIAGNOSIS — R3915 Urgency of urination: Secondary | ICD-10-CM | POA: Diagnosis present

## 2015-02-05 DIAGNOSIS — E1310 Other specified diabetes mellitus with ketoacidosis without coma: Secondary | ICD-10-CM | POA: Diagnosis present

## 2015-02-05 DIAGNOSIS — E1122 Type 2 diabetes mellitus with diabetic chronic kidney disease: Secondary | ICD-10-CM | POA: Diagnosis present

## 2015-02-05 DIAGNOSIS — Z809 Family history of malignant neoplasm, unspecified: Secondary | ICD-10-CM | POA: Diagnosis not present

## 2015-02-05 DIAGNOSIS — K219 Gastro-esophageal reflux disease without esophagitis: Secondary | ICD-10-CM | POA: Diagnosis not present

## 2015-02-05 DIAGNOSIS — N12 Tubulo-interstitial nephritis, not specified as acute or chronic: Secondary | ICD-10-CM | POA: Diagnosis present

## 2015-02-05 DIAGNOSIS — C8590 Non-Hodgkin lymphoma, unspecified, unspecified site: Secondary | ICD-10-CM | POA: Diagnosis present

## 2015-02-05 DIAGNOSIS — I130 Hypertensive heart and chronic kidney disease with heart failure and stage 1 through stage 4 chronic kidney disease, or unspecified chronic kidney disease: Secondary | ICD-10-CM | POA: Diagnosis present

## 2015-02-05 DIAGNOSIS — Z8546 Personal history of malignant neoplasm of prostate: Secondary | ICD-10-CM | POA: Diagnosis not present

## 2015-02-05 DIAGNOSIS — C8510 Unspecified B-cell lymphoma, unspecified site: Secondary | ICD-10-CM | POA: Diagnosis present

## 2015-02-05 DIAGNOSIS — E860 Dehydration: Secondary | ICD-10-CM | POA: Diagnosis not present

## 2015-02-05 DIAGNOSIS — N1339 Other hydronephrosis: Secondary | ICD-10-CM | POA: Diagnosis not present

## 2015-02-05 DIAGNOSIS — Z794 Long term (current) use of insulin: Secondary | ICD-10-CM | POA: Diagnosis not present

## 2015-02-05 DIAGNOSIS — N131 Hydronephrosis with ureteral stricture, not elsewhere classified: Secondary | ICD-10-CM | POA: Diagnosis present

## 2015-02-05 DIAGNOSIS — Z6828 Body mass index (BMI) 28.0-28.9, adult: Secondary | ICD-10-CM | POA: Diagnosis not present

## 2015-02-05 DIAGNOSIS — E1140 Type 2 diabetes mellitus with diabetic neuropathy, unspecified: Secondary | ICD-10-CM | POA: Diagnosis present

## 2015-02-05 DIAGNOSIS — R509 Fever, unspecified: Secondary | ICD-10-CM | POA: Diagnosis not present

## 2015-02-05 DIAGNOSIS — N10 Acute tubulo-interstitial nephritis: Secondary | ICD-10-CM | POA: Diagnosis not present

## 2015-02-05 DIAGNOSIS — N179 Acute kidney failure, unspecified: Secondary | ICD-10-CM

## 2015-02-05 DIAGNOSIS — R0902 Hypoxemia: Secondary | ICD-10-CM | POA: Diagnosis not present

## 2015-02-05 DIAGNOSIS — Z66 Do not resuscitate: Secondary | ICD-10-CM | POA: Diagnosis present

## 2015-02-05 DIAGNOSIS — Z9221 Personal history of antineoplastic chemotherapy: Secondary | ICD-10-CM | POA: Diagnosis not present

## 2015-02-05 DIAGNOSIS — N134 Hydroureter: Secondary | ICD-10-CM | POA: Diagnosis not present

## 2015-02-05 DIAGNOSIS — Z9079 Acquired absence of other genital organ(s): Secondary | ICD-10-CM | POA: Diagnosis not present

## 2015-02-05 DIAGNOSIS — Z87891 Personal history of nicotine dependence: Secondary | ICD-10-CM | POA: Diagnosis not present

## 2015-02-05 HISTORY — PX: TRANSURETHRAL RESECTION OF BLADDER TUMOR WITH GYRUS (TURBT-GYRUS): SHX6458

## 2015-02-05 HISTORY — PX: CYSTOSCOPY WITH STENT PLACEMENT: SHX5790

## 2015-02-05 LAB — INFLUENZA PANEL BY PCR (TYPE A & B)
H1N1FLUPCR: NOT DETECTED
INFLAPCR: NEGATIVE
INFLBPCR: NEGATIVE

## 2015-02-05 LAB — COMPREHENSIVE METABOLIC PANEL
ALT: 28 U/L (ref 17–63)
ANION GAP: 8 (ref 5–15)
AST: 32 U/L (ref 15–41)
Albumin: 3.4 g/dL — ABNORMAL LOW (ref 3.5–5.0)
Alkaline Phosphatase: 52 U/L (ref 38–126)
BILIRUBIN TOTAL: 0.6 mg/dL (ref 0.3–1.2)
BUN: 44 mg/dL — ABNORMAL HIGH (ref 6–20)
CALCIUM: 8.4 mg/dL — AB (ref 8.9–10.3)
CO2: 20 mmol/L — ABNORMAL LOW (ref 22–32)
Chloride: 105 mmol/L (ref 101–111)
Creatinine, Ser: 2.64 mg/dL — ABNORMAL HIGH (ref 0.61–1.24)
GFR calc Af Amer: 25 mL/min — ABNORMAL LOW (ref >60)
GFR, EST NON AFRICAN AMERICAN: 21 mL/min — AB (ref >60)
Glucose, Bld: 169 mg/dL — ABNORMAL HIGH (ref 65–99)
POTASSIUM: 3.8 mmol/L (ref 3.5–5.1)
Sodium: 133 mmol/L — ABNORMAL LOW (ref 135–145)
TOTAL PROTEIN: 6.3 g/dL — AB (ref 6.5–8.1)

## 2015-02-05 LAB — GLUCOSE, CAPILLARY
GLUCOSE-CAPILLARY: 154 mg/dL — AB (ref 65–99)
Glucose-Capillary: 143 mg/dL — ABNORMAL HIGH (ref 65–99)
Glucose-Capillary: 289 mg/dL — ABNORMAL HIGH (ref 65–99)
Glucose-Capillary: 207 mg/dL — ABNORMAL HIGH (ref 65–99)

## 2015-02-05 LAB — CBC
HEMATOCRIT: 25.6 % — AB (ref 39.0–52.0)
HEMOGLOBIN: 8.7 g/dL — AB (ref 13.0–17.0)
MCH: 29.7 pg (ref 26.0–34.0)
MCHC: 34 g/dL (ref 30.0–36.0)
MCV: 87.4 fL (ref 78.0–100.0)
Platelets: 96 10*3/uL — ABNORMAL LOW (ref 150–400)
RBC: 2.93 MIL/uL — ABNORMAL LOW (ref 4.22–5.81)
RDW: 15.1 % (ref 11.5–15.5)
WBC: 4.9 10*3/uL (ref 4.0–10.5)

## 2015-02-05 LAB — CREATININE, URINE, RANDOM: CREATININE, URINE: 27.98 mg/dL

## 2015-02-05 LAB — MAGNESIUM: MAGNESIUM: 1.7 mg/dL (ref 1.7–2.4)

## 2015-02-05 LAB — OSMOLALITY, URINE: OSMOLALITY UR: 208 mosm/kg — AB (ref 390–1090)

## 2015-02-05 LAB — LACTATE DEHYDROGENASE: LDH: 119 U/L (ref 98–192)

## 2015-02-05 LAB — TSH: TSH: 4.567 u[IU]/mL — ABNORMAL HIGH (ref 0.350–4.500)

## 2015-02-05 LAB — SODIUM, URINE, RANDOM: SODIUM UR: 40 mmol/L

## 2015-02-05 LAB — SURGICAL PCR SCREEN
MRSA, PCR: NEGATIVE
STAPHYLOCOCCUS AUREUS: NEGATIVE

## 2015-02-05 LAB — PHOSPHORUS: PHOSPHORUS: 2.3 mg/dL — AB (ref 2.5–4.6)

## 2015-02-05 SURGERY — CYSTOSCOPY, WITH STENT INSERTION
Anesthesia: General | Site: Ureter

## 2015-02-05 MED ORDER — ONDANSETRON HCL 4 MG/2ML IJ SOLN
INTRAMUSCULAR | Status: AC
  Filled 2015-02-05: qty 2

## 2015-02-05 MED ORDER — EPHEDRINE SULFATE 50 MG/ML IJ SOLN
INTRAMUSCULAR | Status: AC
  Filled 2015-02-05: qty 1

## 2015-02-05 MED ORDER — PROPOFOL 10 MG/ML IV BOLUS
INTRAVENOUS | Status: DC | PRN
  Administered 2015-02-05: 130 mg via INTRAVENOUS

## 2015-02-05 MED ORDER — SODIUM CHLORIDE 0.9 % IJ SOLN
INTRAMUSCULAR | Status: AC
  Filled 2015-02-05: qty 10

## 2015-02-05 MED ORDER — SODIUM CHLORIDE 0.9 % IJ SOLN
10.0000 mL | INTRAMUSCULAR | Status: DC | PRN
  Administered 2015-02-06 – 2015-02-08 (×2): 10 mL
  Filled 2015-02-05 (×2): qty 40

## 2015-02-05 MED ORDER — METOCLOPRAMIDE HCL 5 MG/ML IJ SOLN
INTRAMUSCULAR | Status: AC
  Filled 2015-02-05: qty 2

## 2015-02-05 MED ORDER — FAMOTIDINE IN NACL 20-0.9 MG/50ML-% IV SOLN
20.0000 mg | Freq: Once | INTRAVENOUS | Status: AC
  Administered 2015-02-05: 20 mg via INTRAVENOUS
  Filled 2015-02-05: qty 50

## 2015-02-05 MED ORDER — SUCCINYLCHOLINE CHLORIDE 20 MG/ML IJ SOLN
INTRAMUSCULAR | Status: DC | PRN
  Administered 2015-02-05: 100 mg via INTRAVENOUS

## 2015-02-05 MED ORDER — FENTANYL CITRATE (PF) 100 MCG/2ML IJ SOLN
INTRAMUSCULAR | Status: DC | PRN
  Administered 2015-02-05: 50 ug via INTRAVENOUS
  Administered 2015-02-05: 25 ug via INTRAVENOUS

## 2015-02-05 MED ORDER — LIDOCAINE HCL (CARDIAC) 20 MG/ML IV SOLN
INTRAVENOUS | Status: AC
  Filled 2015-02-05: qty 5

## 2015-02-05 MED ORDER — PHENYLEPHRINE HCL 10 MG/ML IJ SOLN
INTRAMUSCULAR | Status: DC | PRN
  Administered 2015-02-05 (×3): 80 ug via INTRAVENOUS

## 2015-02-05 MED ORDER — INFLUENZA VAC SPLIT QUAD 0.5 ML IM SUSY
0.5000 mL | PREFILLED_SYRINGE | INTRAMUSCULAR | Status: AC
  Administered 2015-02-07: 0.5 mL via INTRAMUSCULAR
  Filled 2015-02-05 (×2): qty 0.5

## 2015-02-05 MED ORDER — HYDROMORPHONE HCL 1 MG/ML IJ SOLN
0.2500 mg | INTRAMUSCULAR | Status: DC | PRN
  Administered 2015-02-07: 0.25 mg via INTRAVENOUS
  Filled 2015-02-05: qty 1

## 2015-02-05 MED ORDER — LIDOCAINE HCL (CARDIAC) 20 MG/ML IV SOLN
INTRAVENOUS | Status: DC | PRN
  Administered 2015-02-05: 50 mg via INTRAVENOUS

## 2015-02-05 MED ORDER — IOHEXOL 300 MG/ML  SOLN
INTRAMUSCULAR | Status: DC | PRN
  Administered 2015-02-05: 15 mL via URETHRAL

## 2015-02-05 MED ORDER — 0.9 % SODIUM CHLORIDE (POUR BTL) OPTIME
TOPICAL | Status: DC | PRN
  Administered 2015-02-05: 1000 mL

## 2015-02-05 MED ORDER — DEXAMETHASONE SODIUM PHOSPHATE 10 MG/ML IJ SOLN
INTRAMUSCULAR | Status: AC
  Filled 2015-02-05: qty 1

## 2015-02-05 MED ORDER — LACTATED RINGERS IV SOLN
INTRAVENOUS | Status: DC | PRN
  Administered 2015-02-05: 19:00:00 via INTRAVENOUS

## 2015-02-05 MED ORDER — DEXAMETHASONE SODIUM PHOSPHATE 10 MG/ML IJ SOLN
INTRAMUSCULAR | Status: DC | PRN
  Administered 2015-02-05: 10 mg via INTRAVENOUS

## 2015-02-05 MED ORDER — SODIUM CHLORIDE 0.9 % IR SOLN
Status: DC | PRN
  Administered 2015-02-05: 9000 mL

## 2015-02-05 MED ORDER — FENTANYL CITRATE (PF) 100 MCG/2ML IJ SOLN
INTRAMUSCULAR | Status: AC
  Filled 2015-02-05: qty 4

## 2015-02-05 MED ORDER — PROPOFOL 10 MG/ML IV BOLUS
INTRAVENOUS | Status: AC
  Filled 2015-02-05: qty 20

## 2015-02-05 MED ORDER — PHENYLEPHRINE 40 MCG/ML (10ML) SYRINGE FOR IV PUSH (FOR BLOOD PRESSURE SUPPORT)
PREFILLED_SYRINGE | INTRAVENOUS | Status: AC
  Filled 2015-02-05: qty 10

## 2015-02-05 MED ORDER — PROMETHAZINE HCL 25 MG/ML IJ SOLN: 6.2500 mg | INTRAMUSCULAR | Status: DC | PRN

## 2015-02-05 MED ORDER — ONDANSETRON HCL 4 MG/2ML IJ SOLN
INTRAMUSCULAR | Status: DC | PRN
  Administered 2015-02-05: 4 mg via INTRAVENOUS

## 2015-02-05 SURGICAL SUPPLY — 19 items
BAG URINE DRAINAGE (UROLOGICAL SUPPLIES) ×3 IMPLANT
BAG URO CATCHER STRL LF (DRAPE) ×3 IMPLANT
BASKET LASER NITINOL 1.9FR (BASKET) IMPLANT
CATH FOLEY 2WAY SLVR  5CC 20FR (CATHETERS) ×1
CATH FOLEY 2WAY SLVR 5CC 20FR (CATHETERS) ×2 IMPLANT
CATH INTERMIT  6FR 70CM (CATHETERS) ×3 IMPLANT
CLOTH BEACON ORANGE TIMEOUT ST (SAFETY) ×3 IMPLANT
GLOVE BIOGEL M 8.0 STRL (GLOVE) ×6 IMPLANT
GOWN STRL REUS W/TWL XL LVL3 (GOWN DISPOSABLE) ×6 IMPLANT
GUIDEWIRE STR DUAL SENSOR (WIRE) ×3 IMPLANT
HOLDER FOLEY CATH W/STRAP (MISCELLANEOUS) ×3 IMPLANT
IV NS 1000ML (IV SOLUTION) ×1
IV NS 1000ML BAXH (IV SOLUTION) ×2 IMPLANT
LOOPS RESECTOSCOPE DISP (ELECTROSURGICAL) ×3 IMPLANT
MANIFOLD NEPTUNE II (INSTRUMENTS) ×3 IMPLANT
PACK CYSTO (CUSTOM PROCEDURE TRAY) ×3 IMPLANT
STENT URET 6FRX24 CONTOUR (STENTS) ×6 IMPLANT
SYRINGE 10CC LL (SYRINGE) ×3 IMPLANT
TUBING CONNECTING 10 (TUBING) ×3 IMPLANT

## 2015-02-05 NOTE — Anesthesia Procedure Notes (Signed)
Procedure Name: Intubation Date/Time: 02/05/2015 7:15 PM Performed by: Noralyn Pick D Pre-anesthesia Checklist: Patient identified, Emergency Drugs available, Suction available and Patient being monitored Patient Re-evaluated:Patient Re-evaluated prior to inductionOxygen Delivery Method: Circle System Utilized Preoxygenation: Pre-oxygenation with 100% oxygen Intubation Type: IV induction, Rapid sequence and Cricoid Pressure applied Laryngoscope Size: Mac and 4 Tube type: Oral Tube size: 7.5 mm Number of attempts: 1 Airway Equipment and Method: Stylet and Oral airway Placement Confirmation: ETT inserted through vocal cords under direct vision,  positive ETCO2 and breath sounds checked- equal and bilateral Secured at: 22 cm Tube secured with: Tape Dental Injury: Teeth and Oropharynx as per pre-operative assessment

## 2015-02-05 NOTE — Anesthesia Preprocedure Evaluation (Addendum)
Anesthesia Evaluation  Patient identified by MRN, date of birth, ID band Patient awake    Reviewed: Allergy & Precautions, NPO status , Patient's Chart, lab work & pertinent test results  Airway Mallampati: II  TM Distance: >3 FB Neck ROM: Full    Dental no notable dental hx.    Pulmonary neg pulmonary ROS, former smoker,    Pulmonary exam normal breath sounds clear to auscultation       Cardiovascular hypertension, Pt. on medications Normal cardiovascular exam+ dysrhythmias Atrial Fibrillation  Rhythm:Regular Rate:Normal     Neuro/Psych negative neurological ROS  negative psych ROS   GI/Hepatic negative GI ROS, Neg liver ROS,   Endo/Other  diabetes  Renal/GU Renal InsufficiencyRenal disease  negative genitourinary   Musculoskeletal negative musculoskeletal ROS (+)   Abdominal   Peds negative pediatric ROS (+)  Hematology  (+) anemia , Non Hodgkin's lymphoma ([place]   Anesthesia Other Findings   Reproductive/Obstetrics negative OB ROS                            Anesthesia Physical Anesthesia Plan  ASA: III  Anesthesia Plan: General   Post-op Pain Management:    Induction: Intravenous and Rapid sequence  Airway Management Planned: Oral ETT  Additional Equipment:   Intra-op Plan:   Post-operative Plan:   Informed Consent: I have reviewed the patients History and Physical, chart, labs and discussed the procedure including the risks, benefits and alternatives for the proposed anesthesia with the patient or authorized representative who has indicated his/her understanding and acceptance.   Dental advisory given  Plan Discussed with: CRNA and Surgeon  Anesthesia Plan Comments: (Patient full lunch at noon)       Anesthesia Quick Evaluation

## 2015-02-05 NOTE — Evaluation (Signed)
Physical Therapy One Time Evaluation Patient Details Name: [name] MRN: [id number] DOB: Sep 08, 1931 Today's Date: 02/05/2015   History of Present Illness  81 year old gentleman onset multiple medical problems including non-Hodgkin's lymphoma with spread to liver and lymph nodes, thrombocytopenia, combined systolic and diastolic heart failure, chronic kidney disease, prostate cancer with likely recurrence and diabetes mellitus on chronic insulin and admitted with febrile illness   Clinical Impression  Patient evaluated by Physical Therapy with no further acute PT needs identified. All education has been completed and the patient has no further questions.  Pt mobilizing well and appears at his baseline.  See below for any follow-up Physical Therapy or equipment needs. PT is signing off. Thank you for this referral.   Follow Up Recommendations No PT follow up    Equipment Recommendations  None recommended by PT    Recommendations for Other Services       Precautions / Restrictions Precautions Precaution Comments: droplet      Mobility  Bed Mobility Overal bed mobility: Modified Independent                Transfers Overall transfer level: Modified independent                  Ambulation/Gait Ambulation/Gait assistance: Supervision;Modified independent (Device/Increase time) Ambulation Distance (Feet): 200 Feet Assistive device: None Gait Pattern/deviations: Step-through pattern;Antalgic     General Gait Details: antalgic type gait pattern with R LE slightly ER/foot out (daughter reports fx on this leg), pt without complaints, SpO2 96% pregait and postgait room air  Stairs            Wheelchair Mobility    Modified Rankin (Stroke Patients Only)       Balance Overall balance assessment: No apparent balance deficits (not formally assessed) (able to start/stop and walk backwards 10 feet without difficulty)                                            Pertinent Vitals/Pain Pain Assessment: No/denies pain    Home Living Family/patient expects to be discharged to:: Private residence Living Arrangements: Alone Available Help at Discharge: Family Type of Home: House       Home Layout: One level Home Equipment: None      Prior Function Level of Independence: Independent         Comments: gardens     Journalist, newspaper        Extremity/Trunk Assessment               Lower Extremity Assessment: RLE deficits/detail RLE Deficits / Details: hx of compound femur fx per daughter, presents with slightly antalgic type gait pattern however pt reports no pain       Communication   Communication: HOH  Cognition Arousal/Alertness: Awake/alert Behavior During Therapy: WFL for tasks assessed/performed Overall Cognitive Status: Difficult to assess                      General Comments      Exercises        Assessment/Plan    PT Assessment Patent does not need any further PT services  PT Diagnosis     PT Problem List    PT Treatment Interventions     PT Goals (Current goals can be found in the Care Plan section) Acute Rehab PT Goals PT Goal Formulation:  All assessment and education complete, DC therapy    Frequency     Barriers to discharge        Co-evaluation               End of Session Equipment Utilized During Treatment: Gait belt Activity Tolerance: Patient tolerated treatment well Patient left: in chair;with call bell/phone within reach;with family/visitor present      Functional Assessment Tool Used: clinical judgement Functional Limitation: Mobility: Walking and moving around Mobility: Walking and Moving Around Current Status [id number]): At least 1 percent but less than 20 percent impaired, limited or restricted Mobility: Walking and Moving Around Goal Status [phone]): At least 1 percent but less than 20 percent impaired, limited or restricted Mobility:  Walking and Moving Around Discharge Status [phone]): At least 1 percent but less than 20 percent impaired, limited or restricted    Time: 0905-0914 PT Time Calculation (min) (ACUTE ONLY): 9 min   Charges:   PT Evaluation $Initial PT Evaluation Tier I: 1 Procedure     PT G Codes:   PT G-Codes **NOT FOR INPATIENT CLASS** Functional Assessment Tool Used: clinical judgement Functional Limitation: Mobility: Walking and moving around Mobility: Walking and Moving Around Current Status [id number]): At least 1 percent but less than 20 percent impaired, limited or restricted Mobility: Walking and Moving Around Goal Status [phone]): At least 1 percent but less than 20 percent impaired, limited or restricted Mobility: Walking and Moving Around Discharge Status [phone]): At least 1 percent but less than 20 percent impaired, limited or restricted    Erma Joubert,KATHrine E 02/05/2015, 12:58 PM Carmelia Bake, PT, DPT 02/05/2015 Pager: [phone]

## 2015-02-05 NOTE — Patient Instructions (Signed)
[name]  02/05/2015   Your procedure is scheduled on: February 16, 2015  Report to [facility]  Entrance take [place]  elevators to 3rd floor to  [place] at  12:45 AM.  Call this number if you have problems the morning of surgery [phone]   Remember: ONLY 1 PERSON MAY GO WITH YOU TO SHORT STAY TO GET  READY MORNING OF [name].  Do not eat food after midnight on Sunday night.  May follow clear liquid diet until 8:00 AM on Monday.  Then nothing by mouth.   CLEAR LIQUID DIET   Foods Allowed                                                                     Foods Excluded  Coffee and tea, regular and decaf                             liquids that you cannot  Plain Jell-O in any flavor                                             see through such as: Fruit ices (not with fruit pulp)                                     milk, soups, orange juice  Iced Popsicles                                    All solid food Carbonated beverages, regular and diet                                    Cranberry, grape and apple juices Sports drinks like Gatorade Lightly seasoned clear broth or consume(fat free) Sugar, honey syrup  Sample Menu Breakfast                                Lunch                                     Supper Cranberry juice                    Beef broth                            Chicken broth Jell-O                                     Grape juice  Apple juice Coffee or tea                        Jell-O                                      Popsicle                                                Coffee or tea                        Coffee or tea  _____________________________________________________________________     Take these medicines the morning of surgery with A SIP OF WATER:  Amlodipine (norvasc), Zyrtec, Gabapentin, Zoloft DO NOT TAKE ANY DIABETIC MEDICATIONS DAY OF YOUR SURGERY                You may not have any metal on your body including hair pins and              piercings  Do not wear jewelry, lotions, powders or perfumes, deodorant                  Men may shave face and neck.   Do not bring valuables to the hospital. [place].  Contacts, dentures or bridgework may not be worn into surgery.       Patients discharged the day of surgery will not be allowed to drive home.  Name and phone number of your driver:  Special Instructions: coughing and deep breathing exercises, leg exercises              Please read over the following fact sheets you were given: _____________________________________________________________________             [facility] - Preparing for Surgery Before surgery, you can play an important role.  Because skin is not sterile, your skin needs to be as free of germs as possible.  You can reduce the number of germs on your skin by washing with CHG (chlorahexidine gluconate) soap before surgery.  CHG is an antiseptic cleaner which kills germs and bonds with the skin to continue killing germs even after washing. Please DO NOT use if you have an allergy to CHG or antibacterial soaps.  If your skin becomes reddened/irritated stop using the CHG and inform your nurse when you arrive at Short Stay. Do not shave (including legs and underarms) for at least 48 hours prior to the first CHG shower.  You may shave your face/neck. Please follow these instructions carefully:  1.  Shower with CHG Soap the night before surgery and the  morning of Surgery.  2.  If you choose to wash your hair, wash your hair first as usual with your  normal  shampoo.  3.  After you shampoo, rinse your hair and body thoroughly to remove the  shampoo.                           4.  Use CHG as you would any other liquid soap.  You can apply chg directly  to the skin and wash                       Gently with a scrungie or clean  washcloth.  5.  Apply the CHG Soap to your body ONLY FROM THE NECK DOWN.   Do not use on face/ open                           Wound or open sores. Avoid contact with eyes, ears mouth and genitals (private parts).                       Wash face,  Genitals (private parts) with your normal soap.             6.  Wash thoroughly, paying special attention to the area where your surgery  will be performed.  7.  Thoroughly rinse your body with warm water from the neck down.  8.  DO NOT shower/wash with your normal soap after using and rinsing off  the CHG Soap.                9.  Pat yourself dry with a clean towel.            10.  Wear clean pajamas.            11.  Place clean sheets on your bed the night of your first shower and do not  sleep with pets. Day of Surgery : Do not apply any lotions/deodorants the morning of surgery.  Please wear clean clothes to the hospital/surgery center.  FAILURE TO FOLLOW THESE INSTRUCTIONS MAY RESULT IN THE CANCELLATION OF YOUR SURGERY PATIENT SIGNATURE_________________________________  NURSE SIGNATURE__________________________________  ________________________________________________________________________

## 2015-02-05 NOTE — Transfer of Care (Signed)
Immediate Anesthesia Transfer of Care Note  Patient: [name]  Procedure(s) Performed: Procedure(s): CYSTOSCOPY RETROGRADE AND BILATERAL  STENT PLACEMENT (Bilateral) TRANSURETHRAL RESECTION OF BLADDER TUMOR   (N/A)  Patient Location: PACU  Anesthesia Type:General  Level of Consciousness: awake, alert  and oriented  Airway & Oxygen Therapy: Patient Spontanous Breathing and Patient connected to face mask oxygen  Post-op Assessment: Report given to RN and Post -op Vital signs reviewed and stable  Post vital signs: Reviewed and stable  Last Vitals:  Filed Vitals:   02/05/15 1408  BP: 116/62  Pulse: 77  Temp: 36.9 C  Resp: 18    Complications: No apparent anesthesia complications

## 2015-02-05 NOTE — Consult Note (Cosign Needed)
Urology Consult  CC: Referring physician: Dr. Vassie Moment Reason for referral: Bilateral hydronephrosis and fever  History of Present Illness:   [name] is 81 years old with the following urologic history:    1) Prostate cancer: He is s/p primary surgical with a radical prostatectomy in 2001 for pT3b N0 Mx, Gleason 3+4=7 adenocarcinoma of the prostate. He developed a biochemical recurrence in December 2006 when his PSA was noted to be 0.6. His PSA had increased slowly and was only 1.4 in January 2010 but further increased to 8.01 in November 2014. At that time, a CT scan was performed that demonstrated a retroperitoneal mass/lymphadenopathy and it was found to be recurrent B cell lymphoma. He had initially been diagnosed with lymphoma in 2006 and was treated with CHOP chemotherapy and rituximab which he stopped in 2008. After his recurrence of lymphoma, he was given options and refused a stem cell transplant and was again treated with chemotherapy and maintenance rituximab. His PSA at the time of his initial consultation with me in November 2015 was 15.83.    2) Urinary retention: He has a history of urinary retention after a laparoscopic cholecystectomy in December 2014 but passed a voiding trial without problems since then.     3) Hydronephrosis: He was incidentally noted to have left hydronephrosis on PET imaging for his lymphoma and confirmed on his bone scan imaging for prostate cancer in early 2016. No clear etiology for obstruction was noted on his imaging including no lymphadenopathy. After a discussion with his family including a discussion about renogram imaging to determine if there was obstruction present, he and his family chose to proceed with observation and avoid further imaging as they wished to avoid any intervention (such as stenting) understanding the risk of loss of renal function that may occur.    He was seen in the office on 01/28/15 for further evaluation of his  biochemically recurrent prostate cancer. He denied any new pain symptoms, unintentional weight loss, night sweats, etc. He does have worsening urinary urgency and a slightly weaker stream. This has become somewhat bothersome and he will rarely have urge incontinence. Last week, he developed painless gross hematuria although this has now resolved. He denied any associated flank pain, fever, or dysuria.    Interval history: He was admitted to the hospital after having developed fever to 102.5.  He was started on broad-spectrum antibiotics and a renal ultrasound was obtained today which reveals moderate left hydronephrosis and new right-sided hydronephrosis.  His urine is infected with gram-negative rods.  He is not having significant flank pain at this time on the right or left side.  He did have an episode of gross hematuria last week. His hydronephrosis and fever would be considered severe with no modifying factors or other associated signs and symptoms.  Past Medical History  Diagnosis Date  . Hypertension   . Cancer of liver ([place])   . Kidney stone   . Prostate cancer ([place])   . Skin cancer   . Arthritis   . Anemia   . GERD (gastroesophageal reflux disease)   . Lymphoma ([place])   . [name]   . Neuropathy in diabetes ([place])     Hx: of  . Diabetes mellitus     INSULIN DEPENDENT  . HX, PERSONAL, MALIGNANCY, PROSTATE 07/28/2006    Annotation: 2001, resected Qualifier: Diagnosis of  By: [name], [name]    . A-fib ([place])   . Near syncope 10/18/2013  . Memory deficit  10/18/2013  . HOH (hard of hearing)   . Sleep apnea     on CPAP    Past Surgical History  Procedure Laterality Date  . Prostatectomy    . Kidney stone surgery    . Rotator cuff repair    . Ankle surgery    . Cardiac catheterization  09/16/96    Normal LV systolic function,dense ca+ prox. portion of the LAD w/50% narrowing in the distal portion, 30-40% irreg. in the proximal portion & 80% narrowing in the ostial  portion of the posterolateral branch.  . Colonoscopy      Hx: of  . Infusion port  04/04/2013    RIGHT SUBCLAVIAN  . Cholecystectomy  04/04/2013  . Cholecystectomy N/A 04/04/2013    Procedure: LAPAROSCOPIC CHOLECYSTECTOMY WITH INTRAOPERATIVE CHOLANGIOGRAM;  [name], MD;  Location: [facility];  Service: General;  [name];  . Portacath placement N/A 04/04/2013    Procedure: INSERTION PORT-A-CATH;  [name], MD;  Location: [facility];  Service: General;  [name];    Medications:  Scheduled: . allopurinol  300 mg Oral Daily  . atorvastatin  20 mg Oral QHS  . cefTRIAXone (ROCEPHIN)  IV  1 g Intravenous Q2000  . fenofibrate  160 mg Oral QHS  . gabapentin  300 mg Oral TID  . [START ON 02/06/2015] Influenza vac split quadrivalent PF  0.5 mL Intramuscular Tomorrow-1000  . insulin aspart  0-5 Units Subcutaneous QHS  . insulin aspart  0-9 Units Subcutaneous TID WC  . insulin glargine  30 Units Subcutaneous QHS  . sertraline  100 mg Oral Daily  . sodium chloride  3 mL Intravenous Q12H   Continuous:   Allergies:  Allergies  Allergen Reactions  . Niacin Other (See Comments)    headaches    Family History  Problem Relation Age of Onset  . Heart disease Father   . Heart attack Father   . Cancer Sister   . Heart attack Brother   . Heart attack Brother   . Heart attack Brother   . Heart attack Brother   . Heart attack Brother   . Cancer Brother   . Cancer Brother     Social History:  reports that he has quit smoking. His smoking use included Pipe. He has quit using smokeless tobacco. His smokeless tobacco use included Chew. He reports that he does not drink alcohol or use illicit drugs.  Review of Systems: Pertinent items are noted in HPI. A comprehensive review of systems was negative except as above  Physical Exam:  Vital signs in last 24 hours: Temp:  [98.2 F (36.8 [name])-102.5 F (39.2 [name])] 98.6 F (37 [name]) (10/13 1100) Pulse Rate:  [70-93]  79 (10/13 1100) Resp:  [13-25] 18 (10/13 1100) BP: (88-121)/(36-60) 107/49 mmHg (10/13 1100) SpO2:  [91 %-96 %] 94 % (10/13 1100) Weight:  [81.647 kg (180 lb)-82.3 kg (181 lb 7 oz)] 82.3 kg (181 lb 7 oz) (10/12 2116) General appearance: alert and appears stated age Head: Normocephalic, without obvious abnormality, atraumatic Eyes: conjunctivae/corneas clear. EOM's intact.  Oropharynx: moist mucous membranes Neck: supple, symmetrical, trachea midline Resp: normal respiratory effort Cardio: regular rate and rhythm Back: symmetric, no curvature. ROM normal. No CVA tenderness. GI: soft, non-tender; bowel sounds normal; no masses,  no organomegaly Male genitalia: penis: normal male phallus with no lesions or discharge.Testes: bilaterally descended with no masses or tenderness. no hernias Extremities: extremities normal, atraumatic, no cyanosis or edema Skin: Skin color normal.  No visible rashes or lesions Neurologic: Grossly normal  Laboratory Data:   Recent Labs  02/04/15 1658 02/05/15 0448  WBC 7.1 4.9  HGB 9.8* 8.7*  HCT 29.6* 25.6*   BMET  Recent Labs  02/04/15 1658 02/05/15 0448  NA 129* 133*  K 4.3 3.8  CL 103 105  CO2 19* 20*  GLUCOSE 176* 169*  BUN 46* 44*  CREATININE 2.78* 2.64*  CALCIUM 8.9 8.4*   No results for input(s): LABPT, INR in the last 72 hours. No results for input(s): LABURIN in the last 72 hours. Results for orders placed or performed during the hospital encounter of 02/04/15  Urine culture     Status: None (Preliminary result)   Collection Time: 02/04/15  4:49 PM  Result Value Ref Range Status   Specimen Description URINE, CLEAN CATCH  Final   Special Requests NONE  Final   Culture   Final    >=100,000 COLONIES/mL GRAM NEGATIVE RODS CULTURE REINCUBATED FOR BETTER GROWTH Performed at [facility]    Report Status PENDING  Incomplete   Creatinine:  Recent Labs  02/04/15 1658 02/05/15 0448  CREATININE 2.78* 2.64*     Imaging: Dg Chest 2 View  02/04/2015  CLINICAL DATA:  Fever. EXAM: CHEST  2 VIEW COMPARISON:  Chest x-rays dated 08/16/2013 and 06/18/2013 FINDINGS: Power port is in good position. Heart size and pulmonary vascularity are normal. Lungs are clear. No acute osseous abnormality. IMPRESSION: No active cardiopulmonary disease. Electronically Signed   By: Lorriane Shire M.D.   On: 02/04/2015 17:51   US Renal  02/05/2015  CLINICAL DATA:  Hematuria EXAM: RENAL / URINARY TRACT ULTRASOUND COMPLETE COMPARISON:  None. FINDINGS: Right Kidney: Length: 12.1 cm. Normal renal cortical thickness and echogenicity. No focal renal lesions. Moderate hydronephrosis. Left Kidney: Length: 12.6 cm. Normal renal cortical thickness and echogenicity without focal lesions or definite renal calculi. Marked hydroureteronephrosis. Bladder: Layering debris in the bladder and a right-sided ureterocele. No obvious mass or calculi. No ureteral jets were documented. IMPRESSION: Bilateral hydronephrosis, left greater than right with left hydroureter also noted. Layering debris in the bladder and right-sided ureterocele. Electronically Signed   By: Marijo Sanes M.D.   On: 02/05/2015 11:22    . Cystoscopy was performed on 09/02/14 Procedure: Cystoscopy  Chaperone Present: Esau Grew.   Indication: History of Urothelial Carcinoma.  Informed Consent: Risks, benefits, and potential adverse events were discussed and informed consent was obtained from the patient.  Prep: The patient was prepped with betadine.  Anesthesia:. Local anesthesia was administered intraurethrally with 2% lidocaine jelly.  Antibiotic prophylaxis: Ciprofloxacin.  Procedure Note:  Urethral meatus:. No abnormalities.  Anterior urethra: No abnormalities.  Prostatic urethra:. His prostate is surgically absent. However, he does have nodular tissue growth at the level of the bladder neck entering into the proximal urethra.  Bladder: Visulization was clear. The right  ureteral orifice was in the normal anatomic position and had clear efflux of urine. On entry into the bladder, there is nodular tissue noted at the base of the bladder which protrudes into the bladder appears consistent with extravesical growth posteriorly. This does not appear consistent with urothelial cancer. The left ureteral orifice appears distorted and this likely is the reason for his left hydronephrosis and probable left ureteral obstruction. Further evaluation of the bladder systematically did not reveal any additional abnormal findings. The nodular growths at the base of the bladder and bladder neck do appear friable and likely the cause for his hematuria. The patient  tolerated the procedure well.  Complications: None.   Impression/Assessment:  1. Bladder mass/hematuria: Findings on cystoscopy including a bladder neck nodular growth and nodular growth into the base of his bladder are likely the source of his hematuria. Furthermore, these likely represent malignancies although are not consistent with urothelial cancers. This may represent his recurrent locally advanced prostate cancer. He could possibly represent his lymphoma although this would be unlikely. Regardless, these growths appear to be causing not only hematuria but also obstruction of his left ureter and appeared to be obstructing his urethra. This is also likely the source of his urgency symptoms. I recommended proceeding with cystoscopy and transurethral resection of his bladder tumor further diagnostic evaluation and for palliation to prevent urethral obstruction. I also will plan to proceed with bilateral retrograde pyelography and attempted bilateral stent placement. However, stenting of the left ureter may prove to be difficult and he may require left nephrostomy placement with antegrade stent placement.    2. Biochemically recurrent prostate cancer: His PSA has significantly increased representing progression. However, he did  have a PSA last spring that was 34. He wishes to try to avoid androgen deprivation therapy until absolutely necessary in order to preserve his quality of life. Our current plan is to have him have a repeat PSA in approximately 2 months during his postoperative visit after his upcoming procedure. We will then consider whether we need to start androgen deprivation or possibly repeat his imaging studies. The pathology from his TUR may also prove to be insightful.    3. Left hydronephrosis/ureteral obstruction: We have previously discussed this and elected to proceed with observation understanding the risk of loss of renal function. However, his creatinine is increasing and he may benefit from ureteral stenting. If I'm unable to place a stent at his upcoming procedure because he is febrile with infected urine he will need a left percutaneous nephrostomy tube placed.  Dr. [name] had discussed this with the patient and his daughter at his last visit.  I reiterated that that may be the case at this time.  4.  New right hydronephrosis:  The etiology of this is unclear however I will perform retrograde pyelograms to determine if there is extrinsic compression and will attempt to place a stent on the right hand side as well due to the presence of hydronephrosis, infected urine and fever.  Plan:  Cystoscopy, bilateral retrograde pyelograms, bilateral double-J stents and resectional biopsy of his bladder lesions.  [name] 02/05/2015, 1:41 PM

## 2015-02-05 NOTE — Progress Notes (Signed)
[facility] PROGRESS NOTE  [name] [id number] DOB: 1931-05-18 DOA: 02/04/2015 PCP: Horatio Pel, MD  Assessment/Plan: 1. Postobstructive nephropathy. -Michael Czaja having a history of prostate cancer currently following Dr. [name] in the urology clinic. -He presented to the emergency department found to have a fever of 102 -Lab data revealed elevation in his creatinine, increasing to 2.78 from 2.0 -He was further worked up with renal ultrasound that showed bilateral hydronephrosis left greater than right along with the presence of hydroureter ureter. -He is findings were discussed with Dr.Ottelin of urology who recommended making patient nothing by mouth as he will likely undergo stenting.   2.  UTI/Pyelonephritis -Likely precipitated by an obstructive cause -As mentioned above patient having renal ultrasound that showed the presence of bilateral hydronephrosis. -He resided with a temperature of 102. -Urology a consultation has been placed, meanwhile will continue empiric IV antimicrobial therapy with ceftriaxone 1 g IV every 2-4 hours  3.  History of non-Hodgkin's lymphoma. -He is being followed at the cancer center by Dr.Magriat undergoing rituximab therapy.  4.  Acute on chronic renal failure. -As mentioned above likely secondary to obstructive nephropathy -Urology consultation placed  5.  Paroxysmal atrial fibrillation -Patient remaining in sinus rhythm. -Previously on anticoagulation however this was discontinued due to hematuria  6.  Thrombocytopenia -Labs showing platelet count of 96,000, will continue to monitor.  Code Status: DO NOT RESUSCITATE Family Communication: I spoke with his daughter was present at bedside Disposition Plan:    Consultants:  Urology   Antibiotics:  Ceftriaxone  HPI/Subjective: [name] is a pleasant 81 year old gentleman with a past medical history of prostate cancer Gleason 7 pathologic T3 be (positive  seminal vesicle involvement) adenocarcinoma, non-Hodgkin's lymphoma undergoing therapy with rituximab admitted to the medicine service on 02/04/2015 with complaints of fever, chills, generalized weakness. In the emergency department he was found to have a temperature of 102. He was started on empiric IV antimicrobial therapy with ceftriaxone 1 g IV every 24 hours. Labs revealed a B MP of the 2.78, increased from 2.0 from 12/12/2014. He currently follows Dr. [name] of Urology and family members report there have been plans for him to undergo stenting in the near future. Renal ultrasound showing presence of bilateral hydronephrosis left greater than right with hydroureter ureter. Case has been discussed with Dr. Karsten Ro of urology who will likely place stents during this hospitalization.  Objective: Filed Vitals:   02/05/15 1100  BP: 107/49  Pulse: 79  Temp: 98.6 F (37 C)  Resp: 18    Intake/Output Summary (Last 24 hours) at 02/05/15 1233 Last data filed at 02/05/15 1110  Gross per 24 hour  Intake 1101.25 ml  Output   1825 ml  Net -723.75 ml   Filed Weights   02/04/15 1709 02/04/15 2116  Weight: 81.647 kg (180 lb) 82.3 kg (181 lb 7 oz)    Exam:   General:  Patient is sitting at bedside chair he is awake and alert following commands  Cardiovascular: Regular rate rhythm normal S1-S2 no murmurs rubs or gallops  Respiratory: Normal respiratory effort, lungs are clear to auscultation bilaterally  Abdomen: Soft nontender nondistended  Musculoskeletal: No edema   Data Reviewed: Basic Metabolic Panel:  Recent Labs Lab 02/04/15 1658 02/05/15 0448  NA 129* 133*  K 4.3 3.8  CL 103 105  CO2 19* 20*  GLUCOSE 176* 169*  BUN 46* 44*  CREATININE 2.78* 2.64*  CALCIUM 8.9 8.4*  MG  --  1.7  PHOS  --  2.3*   Liver Function Tests:  Recent Labs Lab 02/04/15 1658 02/05/15 0448  AST 30 32  ALT 26 28  ALKPHOS 57 52  BILITOT 0.9 0.6  PROT 7.2 6.3*  ALBUMIN 4.0 3.4*   No  results for input(s): LIPASE, AMYLASE in the last 168 hours. No results for input(s): AMMONIA in the last 168 hours. CBC:  Recent Labs Lab 02/04/15 1658 02/05/15 0448  WBC 7.1 4.9  NEUTROABS 6.3  --   HGB 9.8* 8.7*  HCT 29.6* 25.6*  MCV 87.8 87.4  PLT 134* 96*   Cardiac Enzymes:  Recent Labs Lab 02/04/15 1658  TROPONINI <0.03   BNP (last 3 results) No results for input(s): BNP in the last 8760 hours.  ProBNP (last 3 results) No results for input(s): PROBNP in the last 8760 hours.  CBG:  Recent Labs Lab 02/04/15 2339 02/05/15 0754 02/05/15 1207  GLUCAP 233* 143* 289*    Recent Results (from the past 240 hour(s))  Urine culture     Status: None (Preliminary result)   Collection Time: 02/04/15  4:49 PM  Result Value Ref Range Status   Specimen Description URINE, CLEAN CATCH  Final   Special Requests NONE  Final   Culture   Final    >=100,000 COLONIES/mL GRAM NEGATIVE RODS CULTURE REINCUBATED FOR BETTER GROWTH Performed at [facility]    Report Status PENDING  Incomplete     Studies: Dg Chest 2 View  02/04/2015  CLINICAL DATA:  Fever. EXAM: CHEST  2 VIEW COMPARISON:  Chest x-rays dated 08/16/2013 and 06/18/2013 FINDINGS: Power port is in good position. Heart size and pulmonary vascularity are normal. Lungs are clear. No acute osseous abnormality. IMPRESSION: No active cardiopulmonary disease. Electronically Signed   By: Lorriane Shire M.D.   On: 02/04/2015 17:51   US Renal  02/05/2015  CLINICAL DATA:  Hematuria EXAM: RENAL / URINARY TRACT ULTRASOUND COMPLETE COMPARISON:  None. FINDINGS: Right Kidney: Length: 12.1 cm. Normal renal cortical thickness and echogenicity. No focal renal lesions. Moderate hydronephrosis. Left Kidney: Length: 12.6 cm. Normal renal cortical thickness and echogenicity without focal lesions or definite renal calculi. Marked hydroureteronephrosis. Bladder: Layering debris in the bladder and a right-sided ureterocele. No obvious mass  or calculi. No ureteral jets were documented. IMPRESSION: Bilateral hydronephrosis, left greater than right with left hydroureter also noted. Layering debris in the bladder and right-sided ureterocele. Electronically Signed   By: Marijo Sanes M.D.   On: 02/05/2015 11:22    Scheduled Meds: . sodium chloride   Intravenous STAT  . allopurinol  300 mg Oral Daily  . atorvastatin  20 mg Oral QHS  . cefTRIAXone (ROCEPHIN)  IV  1 g Intravenous Q2000  . fenofibrate  160 mg Oral QHS  . gabapentin  300 mg Oral TID  . insulin aspart  0-5 Units Subcutaneous QHS  . insulin aspart  0-9 Units Subcutaneous TID WC  . insulin glargine  30 Units Subcutaneous QHS  . sertraline  100 mg Oral Daily  . sodium chloride  3 mL Intravenous Q12H   Continuous Infusions: . sodium chloride 1,000 mL (02/05/15 [id number])    Active Problems:   NHL (non-Hodgkin's lymphoma) (HCC)   Anemia in neoplastic disease   Thrombocytopenia, unspecified (HCC)   DM (diabetes mellitus) type 2, uncontrolled, with ketoacidosis (HCC)   Neuropathy (HCC)   Dehydration   Paroxysmal atrial fibrillation (HCC)   Prostate cancer (HCC)   Fever   CKD (chronic kidney disease) stage 3, GFR 30-59 ml/min  Hyponatremia   UTI (lower urinary tract infection)   Acute on chronic renal failure (HCC)   Chronic combined systolic and diastolic heart failure, NYHA class 1 ([place])    Time spent: 35 min    Whitley Patchen, [facility] Pager [phone]. If 7PM-7AM, please contact night-coverage at [email or web address], password [facility] 02/05/2015, 12:33 PM

## 2015-02-05 NOTE — Op Note (Signed)
Preoperative diagnosis:  1. Bilateral ureteral obstruction 2. Possible bladder mass  Postoperative diagnosis: 1. Same  Procedure(s): 1. Left and right retrograde pyelogram, ureteral stent placement. 2. Transurethral resection of bladder mass, minor  Surgeon: Dr. Kathie Rhodes Resident: Jearld Adjutant, MD  Anesthesia: General  Complications: None  EBL: minimal  Specimens: Bladder tumor  Disposition of specimens: Pathology  Intraoperative findings: abnormal and obscured UOs in normal anatomic location along a trigonal ridge. The ridge was close to the bladder neck 2/2 previous prostatectomy. There was a left bladder neck wall thickening that was ball valving the urethra. There were several sessile lesions along the bladder neck and trigone. 2 representative resections were sent for pathology.  Indication: 81 y.o. male with bilateral hydro and UTI.  Description of procedure:  The patient was brought to the OR, he had received antibiotics on the floor. General anesthesia with ET tube was induced. He was placed in dorsal lithotomy, prepped and draped. A preop timeout was performed.  We entered the bladder with a rigid cystoscope and 30 deg lens. We attempted to cannulate bilateral UOs but were unable to identify good candidates for cannulation. We switched to a 70 deg down lens with Alberans bridge. With this we were able to cannulate the left UO and passed a sensor wire to the renal pelvis and advanced a 6 F open ended catheter over this. We then performed a retrograde pyelogram. We replaced the wire and removed the catheter. We then advanced a 6 x 24 JJ stent over the wire and on pulling the wire had a good curl in the renal pelvis and bladder. This was repeat on the right and a 6 x 24 JJ stent was placed successfully. This was complicated by the significant edema within the bladder and the alteration of normal anatomy from the previous prostatectomy. The wire was placed through the  bladder mucosa on several occasions 2/2 inflammation. These appeared minor and did not pass the mucosa into the detrusor.   We then transitioned to a 59 F resectoscope and used bipolar electrocautery to resect 2 representative specimens from the right bladder neck at the 3 o'clock position and along the left UO. We achieved hemostasis along the way and had good hemostasis at the conclusion. We removed the specimens through the scope and sent these for pathology.  We then placed a 20 F foley catheter and filled the balloon with 10 cc's of water. With the bladder drained the patient was awoken from anesthesia and transferred to the PACU for post op care.

## 2015-02-05 NOTE — Telephone Encounter (Signed)
Spoke with patient dtr re next appointment for 11/2. Appointment scheduled for 11/2 due to HB out 11/3. Per dtr appointments should be fridays and she would also like to have next appointment after 11/2 adjusted. Daughter [name]) forwarded to desk nurse.

## 2015-02-05 NOTE — Progress Notes (Signed)
Echocardiogram 2D Echocardiogram has been performed.  [name] 02/05/2015, 1:48 PM

## 2015-02-06 ENCOUNTER — Other Ambulatory Visit: Payer: Self-pay

## 2015-02-06 ENCOUNTER — Ambulatory Visit: Payer: Medicare Other | Admitting: Nurse Practitioner

## 2015-02-06 ENCOUNTER — Other Ambulatory Visit: Payer: Medicare Other

## 2015-02-06 ENCOUNTER — Ambulatory Visit: Payer: Medicare Other

## 2015-02-06 LAB — BASIC METABOLIC PANEL
Anion gap: 7 (ref 5–15)
BUN: 47 mg/dL — AB (ref 6–20)
CO2: 20 mmol/L — ABNORMAL LOW (ref 22–32)
CREATININE: 2.38 mg/dL — AB (ref 0.61–1.24)
Calcium: 8.9 mg/dL (ref 8.9–10.3)
Chloride: 109 mmol/L (ref 101–111)
GFR calc Af Amer: 28 mL/min — ABNORMAL LOW (ref >60)
GFR, EST NON AFRICAN AMERICAN: 24 mL/min — AB (ref >60)
Glucose, Bld: 240 mg/dL — ABNORMAL HIGH (ref 65–99)
Potassium: 3.9 mmol/L (ref 3.5–5.1)
SODIUM: 136 mmol/L (ref 135–145)

## 2015-02-06 LAB — CBC
HCT: 26.5 % — ABNORMAL LOW (ref 39.0–52.0)
Hemoglobin: 8.9 g/dL — ABNORMAL LOW (ref 13.0–17.0)
MCH: 29.3 pg (ref 26.0–34.0)
MCHC: 33.6 g/dL (ref 30.0–36.0)
MCV: 87.2 fL (ref 78.0–100.0)
Platelets: 124 10*3/uL — ABNORMAL LOW (ref 150–400)
RBC: 3.04 MIL/uL — ABNORMAL LOW (ref 4.22–5.81)
RDW: 15 % (ref 11.5–15.5)
WBC: 4.6 10*3/uL (ref 4.0–10.5)

## 2015-02-06 LAB — IGG, IGA, IGM
IGA: 71 mg/dL (ref 61–437)
IGG (IMMUNOGLOBIN G), SERUM: 510 mg/dL — AB (ref 700–1600)
IgM, Serum: 16 mg/dL (ref 15–143)

## 2015-02-06 LAB — URINE CULTURE

## 2015-02-06 LAB — HEMOGLOBIN A1C
Hgb A1c MFr Bld: 7.4 % — ABNORMAL HIGH (ref 4.8–5.6)
MEAN PLASMA GLUCOSE: 166 mg/dL

## 2015-02-06 LAB — GLUCOSE, CAPILLARY
GLUCOSE-CAPILLARY: 264 mg/dL — AB (ref 65–99)
Glucose-Capillary: 229 mg/dL — ABNORMAL HIGH (ref 65–99)
Glucose-Capillary: 237 mg/dL — ABNORMAL HIGH (ref 65–99)

## 2015-02-06 NOTE — Progress Notes (Signed)
Inpatient Diabetes Program Recommendations  AACE/ADA: New Consensus Statement on Inpatient Glycemic Control (2015)  Target Ranges:  Prepandial:   less than 140 mg/dL      Peak postprandial:   less than 180 mg/dL (1-2 hours)      Critically ill patients:  140 - 180 mg/dL   Review of Glycemic Control  Diabetes history: DM2 Outpatient Diabetes medications: Lantus 60 units Q6am, metformin 1000 mg bid Current orders for Inpatient glycemic control: Lantus 30 units QHS, Novolog sensitive tidwc and hs Results for [name], [name] (MRN [id number]) as of 02/06/2015 08:59  Ref. Range 02/05/2015 12:07 02/05/2015 16:47 02/05/2015 22:47 02/06/2015 07:42  Glucose-Capillary Latest Ref Range: 65-99 mg/dL 289 (H) 154 (H) 207 (H) 229 (H)  Results for [name], [name] (MRN [id number]) as of 02/06/2015 08:59  Ref. Range 02/05/2015 04:48  Hemoglobin A1C Latest Ref Range: 4.8-5.6 % 7.4 (H)     Inpatient Diabetes Program Recommendations:  Insulin - Basal: Increase Lantus to 45 units QHS Correction (SSI): Increase Novolog to moderate tidwc and hs HgbA1C: 7.4% - doubtful this is accurate with low H/H Diet: Add CHO mod med diet   Will continue to follow. Thank you. Lorenda Peck, RD, LDN, CDE Inpatient Diabetes Coordinator [phone]

## 2015-02-06 NOTE — Progress Notes (Signed)
[facility] PROGRESS NOTE  [name] [id number] DOB: [birth year] DOA: 02/04/2015 PCP: Michael Pel, MD  Assessment/Plan: 1. Postobstructive nephropathy. -[name] having a history of prostate cancer currently following [name] in the urology clinic. -He presented to the emergency department found to have a fever of 102 -Lab data revealed elevation in his creatinine, increasing to 2.78 from 2.0 -He was further worked up with renal ultrasound that showed bilateral hydronephrosis left greater than right along with the presence of hydroureter ureter. - On 02/05/2015 patient underwent left and right retrograde pyelogram with ureteral stent placement as well as transurethral resection of bladder mass. Pathology pending. -On 02/06/2015 patient reporting feeling much better, family members state that he is functioning at his baseline.  2.  UTI/Pyelonephritis -Likely precipitated by an obstructive cause -Urine culture obtained on 02/04/2015 growing greater than 100,000 colony-forming units of Escherichia coli, organism is pan susceptible. -Plan to transition to oral antimicrobial therapy in a.m. Currently on ceftriaxone 1 g IV every 24 hours.  3.  History of non-Hodgkin's lymphoma. -He is being followed at the cancer center by [name] undergoing rituximab therapy.  4.  Acute on chronic renal failure. -As mentioned above likely secondary to obstructive nephropathy -Status post bilateral stent placement -A.m. lab work showing a downward trend in creatinine to 2.3 from 2.64  -Will continue to monitor creatinine  5.  Paroxysmal atrial fibrillation -Patient remaining in sinus rhythm. -Previously on anticoagulation however this was discontinued due to hematuria  6.  Thrombocytopenia -Stable  Code Status: DO NOT RESUSCITATE Family Communication: I spoke with his daughter was present at bedside Disposition Plan:     Consultants:  Urology   Antibiotics:  Ceftriaxone  HPI/Subjective: [name] is a pleasant 81 year old gentleman with a past medical history of prostate cancer Gleason 7 pathologic T3 be (positive seminal vesicle involvement) adenocarcinoma, non-Hodgkin's lymphoma undergoing therapy with rituximab admitted to the medicine service on 02/04/2015 with complaints of fever, chills, generalized weakness. In the emergency department he was found to have a temperature of 102. He was started on empiric IV antimicrobial therapy with ceftriaxone 1 g IV every 24 hours. Labs revealed a B MP of the 2.78, increased from 2.0 from 12/12/2014. He currently follows [name] of Urology and family members report there have been plans for him to undergo stenting in the near future. Renal ultrasound showing presence of bilateral hydronephrosis left greater than right with hydroureter ureter. Case has been discussed with [name] of urology who will likely place stents during this hospitalization.  Objective: Filed Vitals:   02/06/15 1358  BP: 106/61  Pulse: 73  Temp: 98 F (36.7 C)  Resp: 16    Intake/Output Summary (Last 24 hours) at 02/06/15 1700 Last data filed at 02/06/15 1435  Gross per 24 hour  Intake    520 ml  Output   2325 ml  Net  -1805 ml   Filed Weights   02/04/15 1709 02/04/15 2116 02/06/15 0513  Weight: 81.647 kg (180 lb) 82.3 kg (181 lb 7 oz) 80.6 kg (177 lb 11.1 oz)    Exam:   General:  Patient looks better today, he is in good spirits.   Cardiovascular: Regular rate rhythm normal S1-S2 no murmurs rubs or gallops  Respiratory: Normal respiratory effort, lungs are clear to auscultation bilaterally  Abdomen: Soft nontender nondistended  Musculoskeletal: No edema   Data Reviewed: Basic Metabolic Panel:  Recent Labs Lab 02/04/15 1658 02/05/15 0448 02/06/15 0517  NA 129* 133* 136  K 4.3 3.8 3.9  CL 103 105 109  CO2 19* 20* 20*  GLUCOSE 176* 169* 240*  BUN  46* 44* 47*  CREATININE 2.78* 2.64* 2.38*  CALCIUM 8.9 8.4* 8.9  MG  --  1.7  --   PHOS  --  2.3*  --    Liver Function Tests:  Recent Labs Lab 02/04/15 1658 02/05/15 0448  AST 30 32  ALT 26 28  ALKPHOS 57 52  BILITOT 0.9 0.6  PROT 7.2 6.3*  ALBUMIN 4.0 3.4*   No results for input(s): LIPASE, AMYLASE in the last 168 hours. No results for input(s): AMMONIA in the last 168 hours. CBC:  Recent Labs Lab 02/04/15 1658 02/05/15 0448 02/06/15 0517  WBC 7.1 4.9 4.6  NEUTROABS 6.3  --   --   HGB 9.8* 8.7* 8.9*  HCT 29.6* 25.6* 26.5*  MCV 87.8 87.4 87.2  PLT 134* 96* 124*   Cardiac Enzymes:  Recent Labs Lab 02/04/15 1658  TROPONINI <0.03   BNP (last 3 results) No results for input(s): BNP in the last 8760 hours.  ProBNP (last 3 results) No results for input(s): PROBNP in the last 8760 hours.  CBG:  Recent Labs Lab 02/05/15 1647 02/05/15 2247 02/06/15 0742 02/06/15 1158 02/06/15 1608  GLUCAP 154* 207* 229* 264* 237*    Recent Results (from the past 240 hour(s))  Urine culture     Status: None   Collection Time: 02/04/15  4:49 PM  Result Value Ref Range Status   Specimen Description URINE, CLEAN CATCH  Final   Special Requests NONE  Final   Culture   Final    >=100,000 COLONIES/mL ESCHERICHIA COLI Performed at [facility]    Report Status 02/06/2015 FINAL  Final   Organism ID, Bacteria ESCHERICHIA COLI  Final      Susceptibility   Escherichia coli - MIC*    AMPICILLIN <=2 SENSITIVE Sensitive     CEFAZOLIN <=4 SENSITIVE Sensitive     CEFTRIAXONE <=1 SENSITIVE Sensitive     CIPROFLOXACIN <=0.25 SENSITIVE Sensitive     GENTAMICIN <=1 SENSITIVE Sensitive     IMIPENEM <=0.25 SENSITIVE Sensitive     NITROFURANTOIN <=16 SENSITIVE Sensitive     TRIMETH/SULFA <=20 SENSITIVE Sensitive     AMPICILLIN/SULBACTAM <=2 SENSITIVE Sensitive     PIP/TAZO <=4 SENSITIVE Sensitive     * >=100,000 COLONIES/mL ESCHERICHIA COLI  Blood Culture (routine x 2)      Status: None (Preliminary result)   Collection Time: 02/04/15  5:00 PM  Result Value Ref Range Status   Specimen Description BLOOD RIGHT ANTECUBITAL  Final   Special Requests BOTTLES DRAWN AEROBIC AND ANAEROBIC 5 ML  Final   Culture   Final    NO GROWTH 2 DAYS Performed at [facility]    Report Status PENDING  Incomplete  Blood Culture (routine x 2)     Status: None (Preliminary result)   Collection Time: 02/04/15  5:25 PM  Result Value Ref Range Status   Specimen Description BLOOD PORTA CATH  Final   Special Requests BOTTLES DRAWN AEROBIC AND ANAEROBIC 5 ML  Final   Culture   Final    NO GROWTH 2 DAYS Performed at [facility]    Report Status PENDING  Incomplete  Surgical pcr screen     Status: None   Collection Time: 02/05/15  3:19 PM  Result Value Ref Range Status   MRSA, PCR NEGATIVE NEGATIVE Final   Staphylococcus aureus NEGATIVE NEGATIVE  Final    Comment:        The Xpert SA Assay (FDA approved for NASAL specimens in patients over 87 years of age), is one component of a comprehensive surveillance program.  Test performance has been validated by [facility] for patients greater than or equal to 21 year old. It is not intended to diagnose infection nor to guide or monitor treatment.      Studies: Dg Chest 2 View  02/04/2015  CLINICAL DATA:  Fever. EXAM: CHEST  2 VIEW COMPARISON:  Chest x-rays dated 08/16/2013 and 06/18/2013 FINDINGS: Power port is in good position. Heart size and pulmonary vascularity are normal. Lungs are clear. No acute osseous abnormality. IMPRESSION: No active cardiopulmonary disease. Electronically Signed   By: Lorriane Shire M.D.   On: 02/04/2015 17:51   US Renal  02/05/2015  CLINICAL DATA:  Hematuria EXAM: RENAL / URINARY TRACT ULTRASOUND COMPLETE COMPARISON:  None. FINDINGS: Right Kidney: Length: 12.1 cm. Normal renal cortical thickness and echogenicity. No focal renal lesions. Moderate hydronephrosis. Left Kidney: Length:  12.6 cm. Normal renal cortical thickness and echogenicity without focal lesions or definite renal calculi. Marked hydroureteronephrosis. Bladder: Layering debris in the bladder and a right-sided ureterocele. No obvious mass or calculi. No ureteral jets were documented. IMPRESSION: Bilateral hydronephrosis, left greater than right with left hydroureter also noted. Layering debris in the bladder and right-sided ureterocele. Electronically Signed   By: Marijo Sanes M.D.   On: 02/05/2015 11:22    Scheduled Meds: . allopurinol  300 mg Oral Daily  . atorvastatin  20 mg Oral QHS  . cefTRIAXone (ROCEPHIN)  IV  1 g Intravenous Q2000  . fenofibrate  160 mg Oral QHS  . gabapentin  300 mg Oral TID  . Influenza vac split quadrivalent PF  0.5 mL Intramuscular Tomorrow-1000  . insulin aspart  0-5 Units Subcutaneous QHS  . insulin aspart  0-9 Units Subcutaneous TID WC  . insulin glargine  30 Units Subcutaneous QHS  . sertraline  100 mg Oral Daily  . sodium chloride  3 mL Intravenous Q12H   Continuous Infusions:    Active Problems:   NHL (non-Hodgkin's lymphoma) (HCC)   Anemia in neoplastic disease   Thrombocytopenia, unspecified (HCC)   DM (diabetes mellitus) type 2, uncontrolled, with ketoacidosis (HCC)   Neuropathy (HCC)   Dehydration   Paroxysmal atrial fibrillation (HCC)   Prostate cancer (HCC)   Fever   CKD (chronic kidney disease) stage 3, GFR 30-59 ml/min   Hyponatremia   UTI (lower urinary tract infection)   Acute on chronic renal failure (HCC)   Chronic combined systolic and diastolic heart failure, NYHA class 1 ([place])    Time spent: 25 min    Kelvin Cellar  [facility] Pager [phone]. If 7PM-7AM, please contact night-coverage at [email or web address], password [facility] 02/06/2015, 5:00 PM  LOS: 1 day

## 2015-02-06 NOTE — Anesthesia Postprocedure Evaluation (Signed)
  Anesthesia Post-op Note  Patient: [name]  Procedure(s) Performed: Procedure(s) (LRB): CYSTOSCOPY RETROGRADE AND BILATERAL  STENT PLACEMENT (Bilateral) TRANSURETHRAL RESECTION OF BLADDER TUMOR   (N/A)  Patient Location: PACU  Anesthesia Type: General  Level of Consciousness: awake and alert   Airway and Oxygen Therapy: Patient Spontanous Breathing  Post-op Pain: mild  Post-op Assessment: Post-op Vital signs reviewed, Patient's Cardiovascular Status Stable, Respiratory Function Stable, Patent Airway and No signs of Nausea or vomiting  Last Vitals:  Filed Vitals:   02/05/15 2248  BP: 121/58  Pulse: 78  Temp: 36.3 C  Resp: 16    Post-op Vital Signs: stable   Complications: No apparent anesthesia complications

## 2015-02-06 NOTE — Progress Notes (Signed)
Spoke w/ CA MD and ok to give Flu vaccine

## 2015-02-06 NOTE — Progress Notes (Signed)
Patient ID: [name], male   DOB: [birth year], 81 y.o.   MRN: [id number]  1 Day Post-Op Subjective: Pt admitted with fever and pyelonephritis.  Also with history of rising Cr and new right hydronephrosis along with known chronic left hydronephrosis. Feeling better s/p stents placed last night. Also s/p TUR biopsy of bladder mass.  Objective: Vital signs in last 24 hours: Temp:  [97.3 F (36.3 C)-99.3 F (37.4 C)] 97.3 F (36.3 C) (10/14 0513) Pulse Rate:  [66-80] 66 (10/14 0513) Resp:  [14-19] 16 (10/14 0513) BP: (107-122)/(49-76) 113/55 mmHg (10/14 0513) SpO2:  [94 %-100 %] 99 % (10/14 0513) Weight:  [80.6 kg (177 lb 11.1 oz)] 80.6 kg (177 lb 11.1 oz) (10/14 0513)  Intake/Output from previous day: 10/13 0701 - 10/14 0700 In: 520 [P.O.:120; I.V.:350; IV Piggyback:50] Out: 1975 [id number] Intake/Output this shift:    Physical Exam:  General: Alert and oriented Abd: Soft, No CVAT  Lab Results:  Recent Labs  02/04/15 1658 02/05/15 0448 02/06/15 0517  HGB 9.8* 8.7* 8.9*  HCT 29.6* 25.6* 26.5*   CBC Latest Ref Rng 02/06/2015 02/05/2015 02/04/2015  WBC 4.0 - 10.5 K/uL 4.6 4.9 7.1  Hemoglobin 13.0 - 17.0 g/dL 8.9(L) 8.7(L) 9.8(L)  Hematocrit 39.0 - 52.0 % 26.5(L) 25.6(L) 29.6(L)  Platelets 150 - 400 K/uL 124(L) 96(L) 134(L)      BMET  Recent Labs  02/05/15 0448 02/06/15 0517  NA 133* 136  K 3.8 3.9  CL 105 109  CO2 20* 20*  GLUCOSE 169* 240*  BUN 44* 47*  CREATININE 2.64* 2.38*  CALCIUM 8.4* 8.9     Studies/Results: Urine cultures pending. Pathology pending.  Assessment/Plan: 1) B ureteral obstruction/AKI: S/P bilateral ureteral stents.  Cr improving.  Continue to follow renal function. 2) Febrile UTI/pyelonephritis: Continue broad spectrum antibiotics pending cultures. Will need 10-14 days of culture appropriate antibiotics. 3) Bladder mass: S/P TUR biopsy.  Pathology pending.  If consistent with locally advanced recurrent prostate cancer, will  begin treatment.   LOS: 1 day   Dwayne Bulkley,LES 02/06/2015, 7:56 AM

## 2015-02-07 DIAGNOSIS — I480 Paroxysmal atrial fibrillation: Secondary | ICD-10-CM

## 2015-02-07 DIAGNOSIS — N12 Tubulo-interstitial nephritis, not specified as acute or chronic: Secondary | ICD-10-CM | POA: Diagnosis not present

## 2015-02-07 LAB — BASIC METABOLIC PANEL
Anion gap: 9 (ref 5–15)
BUN: 49 mg/dL — AB (ref 6–20)
CHLORIDE: 103 mmol/L (ref 101–111)
CO2: 22 mmol/L (ref 22–32)
Calcium: 8.7 mg/dL — ABNORMAL LOW (ref 8.9–10.3)
Creatinine, Ser: 2.63 mg/dL — ABNORMAL HIGH (ref 0.61–1.24)
GFR calc Af Amer: 25 mL/min — ABNORMAL LOW (ref >60)
GFR calc non Af Amer: 21 mL/min — ABNORMAL LOW (ref >60)
Glucose, Bld: 303 mg/dL — ABNORMAL HIGH (ref 65–99)
Potassium: 3.3 mmol/L — ABNORMAL LOW (ref 3.5–5.1)
SODIUM: 134 mmol/L — AB (ref 135–145)

## 2015-02-07 LAB — CBC
HEMATOCRIT: 26.3 % — AB (ref 39.0–52.0)
HEMOGLOBIN: 8.9 g/dL — AB (ref 13.0–17.0)
MCH: 29.4 pg (ref 26.0–34.0)
MCHC: 33.8 g/dL (ref 30.0–36.0)
MCV: 86.8 fL (ref 78.0–100.0)
Platelets: 130 10*3/uL — ABNORMAL LOW (ref 150–400)
RBC: 3.03 MIL/uL — AB (ref 4.22–5.81)
RDW: 15.1 % (ref 11.5–15.5)
WBC: 4.4 10*3/uL (ref 4.0–10.5)

## 2015-02-07 LAB — GLUCOSE, CAPILLARY
GLUCOSE-CAPILLARY: 292 mg/dL — AB (ref 65–99)
GLUCOSE-CAPILLARY: 330 mg/dL — AB (ref 65–99)
Glucose-Capillary: 298 mg/dL — ABNORMAL HIGH (ref 65–99)
Glucose-Capillary: 261 mg/dL — ABNORMAL HIGH (ref 65–99)

## 2015-02-07 MED ORDER — SODIUM CHLORIDE 0.9 % IV SOLN
INTRAVENOUS | Status: DC
Start: 2015-02-07 — End: 2015-02-08
  Administered 2015-02-07 – 2015-02-08 (×2): via INTRAVENOUS

## 2015-02-07 MED ORDER — POTASSIUM CHLORIDE CRYS ER 20 MEQ PO TBCR
40.0000 meq | EXTENDED_RELEASE_TABLET | Freq: Four times a day (QID) | ORAL | Status: AC
  Administered 2015-02-07 (×2): 40 meq via ORAL
  Filled 2015-02-07 (×2): qty 2

## 2015-02-07 MED ORDER — CEFUROXIME AXETIL 250 MG PO TABS
250.0000 mg | ORAL_TABLET | Freq: Every day | ORAL | Status: DC
  Administered 2015-02-07: 250 mg via ORAL
  Filled 2015-02-07 (×2): qty 1

## 2015-02-07 MED ORDER — SODIUM CHLORIDE 0.9 % IV BOLUS (SEPSIS)
500.0000 mL | Freq: Once | INTRAVENOUS | Status: AC
  Administered 2015-02-07: 500 mL via INTRAVENOUS

## 2015-02-07 NOTE — Progress Notes (Signed)
Cr remains around 2.63 Reviewed chart and notes Continue with supportive care When appropriate send home with po antibioitics and see Dr [name] as outpt

## 2015-02-07 NOTE — Progress Notes (Signed)
[facility] PROGRESS NOTE  [name] [id number] DOB: 01-22-1932 DOA: 02/04/2015 PCP: Horatio Pel, MD  Assessment/Plan: 1. Postobstructive nephropathy. -Michael Defusco having a history of prostate cancer currently following Dr. [name] in the urology clinic. -He presented to the emergency department found to have a fever of 102 -Lab data revealed elevation in his creatinine, increasing to 2.78 from 2.0 -He was further worked up with renal ultrasound that showed bilateral hydronephrosis left greater than right along with the presence of hydroureter ureter. - On 02/05/2015 patient underwent left and right retrograde pyelogram with ureteral stent placement as well as transurethral resection of bladder mass. Pathology pending. -On 02/06/2015 patient reporting feeling much better, family members state that he is functioning at his baseline. -Labs reviewed creatinine trended up to 2.63 from 2.38. Will give him IV fluids for the next 24 hours, repeat labs in AM.   2.  UTI/Pyelonephritis -Likely precipitated by an obstructive cause -Urine culture obtained on 02/04/2015 growing greater than 100,000 colony-forming units of Escherichia coli, organism is pan susceptible. -Will stop Ceftriaxone and start Ceftin 250 mg PO BID today  3.  History of non-Hodgkin's lymphoma. -He is being followed at the cancer center by Dr.Magriat undergoing rituximab therapy.  4.  Acute on chronic renal failure. -As mentioned above likely secondary to obstructive nephropathy -Status post bilateral stent placement -Patient's creatinine rising today to 2.63 from 2.38, will give additional IV fluids.  -Repeat ;abs in am.  5.  Paroxysmal atrial fibrillation -Patient remaining in sinus rhythm. -Previously on anticoagulation however this was discontinued due to hematuria  6.  Thrombocytopenia -Stable  Code Status: DO NOT RESUSCITATE Family Communication: I spoke with his daughter was present at  bedside Disposition Plan: Anticipate discharge in the next 24 hours    Consultants:  Urology   Antibiotics:  Ceftriaxone  HPI/Subjective: [name] is a pleasant 81 year old gentleman with a past medical history of prostate cancer Gleason 7 pathologic T3 be (positive seminal vesicle involvement) adenocarcinoma, non-Hodgkin's lymphoma undergoing therapy with rituximab admitted to the medicine service on 02/04/2015 with complaints of fever, chills, generalized weakness. In the emergency department he was found to have a temperature of 102. He was started on empiric IV antimicrobial therapy with ceftriaxone 1 g IV every 24 hours. Labs revealed a B MP of the 2.78, increased from 2.0 from 12/12/2014. He currently follows Dr. [name] of Urology and family members report there have been plans for him to undergo stenting in the near future. Renal ultrasound showing presence of bilateral hydronephrosis left greater than right with hydroureter ureter. Case has been discussed with Dr. Karsten Ro of urology who will likely place stents during this hospitalization.  Objective: Filed Vitals:   02/07/15 1501  BP: 112/61  Pulse: 75  Temp: 98 F (36.7 C)  Resp: 20    Intake/Output Summary (Last 24 hours) at 02/07/15 1554 Last data filed at 02/07/15 1502  Gross per 24 hour  Intake    820 ml  Output   3100 ml  Net  -2280 ml   Filed Weights   02/04/15 2116 02/06/15 0513 02/07/15 0526  Weight: 82.3 kg (181 lb 7 oz) 80.6 kg (177 lb 11.1 oz) 81.5 kg (179 lb 10.8 oz)    Exam:   General:  Patient is stable, stated doing well no complaints.   Cardiovascular: Regular rate rhythm normal S1-S2 no murmurs rubs or gallops  Respiratory: Normal respiratory effort, lungs are clear to auscultation bilaterally  Abdomen: Soft nontender nondistended  Musculoskeletal: No  edema   Data Reviewed: Basic Metabolic Panel:  Recent Labs Lab 02/04/15 1658 02/05/15 0448 02/06/15 0517 02/07/15 0440  NA 129*  133* 136 134*  K 4.3 3.8 3.9 3.3*  CL 103 105 109 103  CO2 19* 20* 20* 22  GLUCOSE 176* 169* 240* 303*  BUN 46* 44* 47* 49*  CREATININE 2.78* 2.64* 2.38* 2.63*  CALCIUM 8.9 8.4* 8.9 8.7*  MG  --  1.7  --   --   PHOS  --  2.3*  --   --    Liver Function Tests:  Recent Labs Lab 02/04/15 1658 02/05/15 0448  AST 30 32  ALT 26 28  ALKPHOS 57 52  BILITOT 0.9 0.6  PROT 7.2 6.3*  ALBUMIN 4.0 3.4*   No results for input(s): LIPASE, AMYLASE in the last 168 hours. No results for input(s): AMMONIA in the last 168 hours. CBC:  Recent Labs Lab 02/04/15 1658 02/05/15 0448 02/06/15 0517 02/07/15 0440  WBC 7.1 4.9 4.6 4.4  NEUTROABS 6.3  --   --   --   HGB 9.8* 8.7* 8.9* 8.9*  HCT 29.6* 25.6* 26.5* 26.3*  MCV 87.8 87.4 87.2 86.8  PLT 134* 96* 124* 130*   Cardiac Enzymes:  Recent Labs Lab 02/04/15 1658  TROPONINI <0.03   BNP (last 3 results) No results for input(s): BNP in the last 8760 hours.  ProBNP (last 3 results) No results for input(s): PROBNP in the last 8760 hours.  CBG:  Recent Labs Lab 02/06/15 1158 02/06/15 1608 02/06/15 2103 02/07/15 0736 02/07/15 1148  GLUCAP 264* 237* 298* 261* 292*    Recent Results (from the past 240 hour(s))  Urine culture     Status: None   Collection Time: 02/04/15  4:49 PM  Result Value Ref Range Status   Specimen Description URINE, CLEAN CATCH  Final   Special Requests NONE  Final   Culture   Final    >=100,000 COLONIES/mL ESCHERICHIA COLI Performed at [facility]    Report Status 02/06/2015 FINAL  Final   Organism ID, Bacteria ESCHERICHIA COLI  Final      Susceptibility   Escherichia coli - MIC*    AMPICILLIN <=2 SENSITIVE Sensitive     CEFAZOLIN <=4 SENSITIVE Sensitive     CEFTRIAXONE <=1 SENSITIVE Sensitive     CIPROFLOXACIN <=0.25 SENSITIVE Sensitive     GENTAMICIN <=1 SENSITIVE Sensitive     IMIPENEM <=0.25 SENSITIVE Sensitive     NITROFURANTOIN <=16 SENSITIVE Sensitive     TRIMETH/SULFA <=20  SENSITIVE Sensitive     AMPICILLIN/SULBACTAM <=2 SENSITIVE Sensitive     PIP/TAZO <=4 SENSITIVE Sensitive     * >=100,000 COLONIES/mL ESCHERICHIA COLI  Blood Culture (routine x 2)     Status: None (Preliminary result)   Collection Time: 02/04/15  5:00 PM  Result Value Ref Range Status   Specimen Description BLOOD RIGHT ANTECUBITAL  Final   Special Requests BOTTLES DRAWN AEROBIC AND ANAEROBIC 5 ML  Final   Culture   Final    NO GROWTH 3 DAYS Performed at [facility] [place]    Report Status PENDING  Incomplete  Blood Culture (routine x 2)     Status: None (Preliminary result)   Collection Time: 02/04/15  5:25 PM  Result Value Ref Range Status   Specimen Description BLOOD PORTA CATH  Final   Special Requests BOTTLES DRAWN AEROBIC AND ANAEROBIC 5 ML  Final   Culture   Final    NO GROWTH 3 DAYS  Performed at [facility]    Report Status PENDING  Incomplete  Surgical pcr screen     Status: None   Collection Time: 02/05/15  3:19 PM  Result Value Ref Range Status   MRSA, PCR NEGATIVE NEGATIVE Final   Staphylococcus aureus NEGATIVE NEGATIVE Final    Comment:        The Xpert SA Assay (FDA approved for NASAL specimens in patients over 17 years of age), is one component of a comprehensive surveillance program.  Test performance has been validated by [facility] for patients greater than or equal to 66 year old. It is not intended to diagnose infection nor to guide or monitor treatment.      Studies: No results found.  Scheduled Meds: . allopurinol  300 mg Oral Daily  . atorvastatin  20 mg Oral QHS  . cefTRIAXone (ROCEPHIN)  IV  1 g Intravenous Q2000  . fenofibrate  160 mg Oral QHS  . gabapentin  300 mg Oral TID  . insulin aspart  0-5 Units Subcutaneous QHS  . insulin aspart  0-9 Units Subcutaneous TID WC  . insulin glargine  30 Units Subcutaneous QHS  . sertraline  100 mg Oral Daily  . sodium chloride  3 mL Intravenous Q12H   Continuous Infusions: . sodium  chloride 100 mL/hr at 02/07/15 0759    Active Problems:   NHL (non-Hodgkin's lymphoma) (HCC)   Anemia in neoplastic disease   Thrombocytopenia, unspecified (HCC)   DM (diabetes mellitus) type 2, uncontrolled, with ketoacidosis (HCC)   Neuropathy (HCC)   Dehydration   Paroxysmal atrial fibrillation (HCC)   Prostate cancer (HCC)   Fever   CKD (chronic kidney disease) stage 3, GFR 30-59 ml/min   Hyponatremia   UTI (lower urinary tract infection)   Acute on chronic renal failure (HCC)   Chronic combined systolic and diastolic heart failure, NYHA class 1 ([place])    Time spent: 15 min    Ayen Viviano  [facility] Pager [phone]. If 7PM-7AM, please contact night-coverage at [email or web address], password [facility] 02/07/2015, 3:54 PM  LOS: 2 days

## 2015-02-07 NOTE — Progress Notes (Signed)
Placed patient on CPAP for the night via auto-mode with minimum pressure set at 4cm and maximum pressure set at 20cm. Sp02 at this timeis 94%,will continue to monitor patient.

## 2015-02-07 NOTE — Progress Notes (Signed)
ANTIBIOTIC CONSULT NOTE - FOLLOW UP  Labs:  Recent Labs  02/04/15 2259 02/05/15 0448 02/06/15 0517 02/07/15 0440  WBC  --  4.9 4.6 4.4  HGB  --  8.7* 8.9* 8.9*  PLT  --  96* 124* 130*  LABCREA 27.98  --   --   --   CREATININE  --  2.64* 2.38* 2.63*   Estimated Creatinine Clearance: 21.3 mL/min (by C-G formula based on Cr of 2.63).  Assessment: 42 yoM admitted 10/12 with postobstructive nephropathy, Hx prostate cancer, bilateral hydronephrosis.  He was started on ceftriaxone for UTI, and now urine culture is growing > 100,000 Ecoli (pan-sens).  Today, 02/07/2015:  Afebrile  WBC remains WNL  Renal function:  SCr remains elevated at 2.63 with CrCl ~ 21 ml/min  MD now changing from Ceftriaxone to Ceftin 250mg  PO BID, but due to poor renal function, dosing should be only once daily for CrCl 10-30 ml/min.  Plan:  Ceftin 250mg  PO once daily with supper.  Gretta Arab PharmD, BCPS Pager [phone] 02/07/2015 4:18 PM

## 2015-02-08 DIAGNOSIS — E860 Dehydration: Secondary | ICD-10-CM

## 2015-02-08 DIAGNOSIS — E871 Hypo-osmolality and hyponatremia: Secondary | ICD-10-CM

## 2015-02-08 LAB — GLUCOSE, CAPILLARY
GLUCOSE-CAPILLARY: 285 mg/dL — AB (ref 65–99)
GLUCOSE-CAPILLARY: 149 mg/dL — AB (ref 65–99)
Glucose-Capillary: 267 mg/dL — ABNORMAL HIGH (ref 65–99)

## 2015-02-08 LAB — BASIC METABOLIC PANEL
Anion gap: 8 (ref 5–15)
BUN: 47 mg/dL — AB (ref 6–20)
CALCIUM: 8.9 mg/dL (ref 8.9–10.3)
CO2: 20 mmol/L — AB (ref 22–32)
CREATININE: 2.18 mg/dL — AB (ref 0.61–1.24)
Chloride: 110 mmol/L (ref 101–111)
GFR calc non Af Amer: 27 mL/min — ABNORMAL LOW (ref >60)
GFR, EST AFRICAN AMERICAN: 31 mL/min — AB (ref >60)
GLUCOSE: 248 mg/dL — AB (ref 65–99)
Potassium: 3.9 mmol/L (ref 3.5–5.1)
Sodium: 138 mmol/L (ref 135–145)

## 2015-02-08 LAB — CBC
HCT: 26.8 % — ABNORMAL LOW (ref 39.0–52.0)
Hemoglobin: 8.9 g/dL — ABNORMAL LOW (ref 13.0–17.0)
MCH: 29.2 pg (ref 26.0–34.0)
MCHC: 33.2 g/dL (ref 30.0–36.0)
MCV: 87.9 fL (ref 78.0–100.0)
PLATELETS: 139 10*3/uL — AB (ref 150–400)
RBC: 3.05 MIL/uL — AB (ref 4.22–5.81)
RDW: 15.2 % (ref 11.5–15.5)
WBC: 4.5 10*3/uL (ref 4.0–10.5)

## 2015-02-08 MED ORDER — CEFUROXIME AXETIL 250 MG PO TABS
250.0000 mg | ORAL_TABLET | Freq: Every day | ORAL | Status: DC
Start: 2015-02-08 — End: 2015-02-25

## 2015-02-08 MED ORDER — HEPARIN SOD (PORK) LOCK FLUSH 100 UNIT/ML IV SOLN
500.0000 [IU] | INTRAVENOUS | Status: AC | PRN
  Administered 2015-02-08: 500 [IU]

## 2015-02-08 MED ORDER — HYDROCODONE-ACETAMINOPHEN 5-325 MG PO TABS: 1.0000 | ORAL_TABLET | Freq: Four times a day (QID) | ORAL | Status: DC | PRN

## 2015-02-08 NOTE — Discharge Summary (Signed)
Physician Discharge Summary  [name] [id number] DOB: [birth year] DOA: 02/04/2015  PCP: Michael Pel, MD  Admit date: 02/04/2015 Discharge date: 02/08/2015  Time spent: 35 minutes  Recommendations for Outpatient Follow-up:  1. Please follow-up on CBC and BMP on hospital follow-up visit. He presented with postobstructive nephropathy, having creatinine of 2.18 with BUN of 47 on day of discharge. 2. Please follow-up on pathology report from resection of bladder mass.  Discharge Diagnoses:  Active Problems:   NHL (non-Hodgkin's lymphoma) (HCC)   Anemia in neoplastic disease   Thrombocytopenia, unspecified (HCC)   DM (diabetes mellitus) type 2, uncontrolled, with ketoacidosis (HCC)   Neuropathy (HCC)   Dehydration   Paroxysmal atrial fibrillation (HCC)   Prostate cancer (HCC)   Fever   CKD (chronic kidney disease) stage 3, GFR 30-59 ml/min   Hyponatremia   UTI (lower urinary tract infection)   Acute on chronic renal failure (HCC)   Chronic combined systolic and diastolic heart failure, NYHA class 1 ([name])   Discharge Condition: Stable  Diet recommendation: Heart healthy  Filed Weights   02/06/15 0513 02/07/15 0526 02/08/15 0500  Weight: 80.6 kg (177 lb 11.1 oz) 81.5 kg (179 lb 10.8 oz) 78.7 kg (173 lb 8 oz)    History of present illness:  Presented with  Patient was found at home febrile up to 102.5 he was not telling his family. Denies any cough, Endorses some myalgias. Denies any sick contacts. no burning with urination. Patient endorses some hematuria. He has hx of prostate cancer followed by urology with likely recurrence he was supposed to have a Renal US done tomorrow.  In ER patient was noted to be transiently hypoxic down to 91%. He is not on home oxygen. Patient has occasional swelling of his right leg ever since he had surgery on it. Otherwise no travel history no sudden onset of chest pain or shortness of breath. Reports for the past 2 months he  has occasional shortness of breath and dyspnea with exertion.  Has hx of smoking but very minimal for total of 3-4 months. He does have history of heart failure for the last echogram was done in 2014 showing reduced EF of 50-55 percent and evidence of grade 1 diastolic dysfunction.  He was found to be febrile up to 102.5. In ER was found likely to have UTI. Chest x-ray was unremarkable. also he was found to have Na of 129 and rising Cr. Up to 2.78 from baseline of 1.6 reports decreased PO intake.  Of note patient has history of non-Hodgkin's lymphoma currently on maintenance rituximab with known spread to the liver and spleen. He is followed by [name] not and was planning to recieve his Chemotherapy therapy next week last treatment was on August. patient also has history of prostate cancer patient's and his daughter states that he was told that he had some recurrence in the prostatic area as well as possible lesion involving one of the kidneys. He is scheduled to undergo renal ultrasound tomorrow to follow this up. Patient has been followed by urology. Has hx of sleep apnea on CPAP  Hospital Course:  Mr [name] is a pleasant 81 year old gentleman with a past medical history of prostate cancer Gleason 7 pathologic T3 be (positive seminal vesicle involvement) adenocarcinoma, non-Hodgkin's lymphoma undergoing therapy with rituximab admitted to the medicine service on 02/04/2015 with complaints of fever, chills, generalized weakness. In the emergency department he was found to have a temperature of 102. He was started on empiric IV  antimicrobial therapy with ceftriaxone 1 g IV every 24 hours. Labs revealed a B MP of the 2.78, increased from 2.0 from 12/12/2014. He currently follows [name] of Urology and family members report there have been plans for him to undergo stenting in the near future. Renal ultrasound showing presence of bilateral hydronephrosis left greater than right with  hydroureter ureter. [name] of urology was consulted. On 02/05/2015 he underwent left and right retrograde pyelogram with ureteral stent placement as well as transurethral resection of bladder mass. Patient tolerated procedure well there were no immediate complications. Postoperatively he did well. His creatinine did go up to 2.63 on 02/07/2015 however trended down to 2.18 after IV fluid administration. He remained afebrile, nontoxic appearing. Ceftriaxone was changed to cefuroxime 250 mg by mouth twice a day. Given clinical stability he was discharged to was home on 02/08/2015.   Postobstructive nephropathy. -[name] having a history of prostate cancer currently following [name] in the urology clinic. -He presented to the emergency department found to have a fever of 102 -Lab data revealed elevation in his creatinine, increasing to 2.78 from 2.0 -He was further worked up with renal ultrasound that showed bilateral hydronephrosis left greater than right along with the presence of hydroureter ureter. - On 02/05/2015 patient underwent left and right retrograde pyelogram with ureteral stent placement as well as transurethral resection of bladder mass. Pathology pending. -Kidney function improved after IV fluid administration -Please follow-up on pathology report from bladder mass resection  2. UTI/Pyelonephritis -Likely precipitated by an obstructive cause -Urine culture obtained on 02/04/2015 growing greater than 100,000 colony-forming units of Escherichia coli, organism is pan susceptible. -He was treated with ceftriaxone during this hospitalization -Will discharge on cefuroxime 250 mg by mouth twice a day 6 days  3. History of non-Hodgkin's lymphoma. -He is being followed at the cancer center by [name] undergoing rituximab therapy.  4. Acute on chronic renal failure. -As mentioned above likely secondary to obstructive nephropathy -Status post bilateral stent placement -Labs  showing improvement to creatinine to 2.18 by 02/08/2015 after IV fluid administration. Please follow-up on BMP on hospital follow-up visit  5. Paroxysmal atrial fibrillation -Patient remaining in sinus rhythm. -Previously on anticoagulation however this was discontinued due to hematuria  6. Thrombocytopenia -Stable  Procedures:  Left and right retrograde Polygram with ureteral stent placement as well as transurethral resection of bladder mass  Consultations:  Urology  Discharge Exam: Filed Vitals:   02/08/15 0529  BP: 114/54  Pulse: 71  Temp: 97.7 F (36.5 C)  Resp: 18     General: Patient is stable, stated doing well no complaints.   Cardiovascular: Regular rate rhythm normal S1-S2 no murmurs rubs or gallops  Respiratory: Normal respiratory effort, lungs are clear to auscultation bilaterally  Abdomen: Soft nontender nondistended  Musculoskeletal: No edema  Discharge Instructions   Discharge Instructions    Call MD for:  difficulty breathing, headache or visual disturbances    Complete by:  As directed      Call MD for:  extreme fatigue    Complete by:  As directed      Call MD for:  hives    Complete by:  As directed      Call MD for:  persistant dizziness or light-headedness    Complete by:  As directed      Call MD for:  persistant nausea and vomiting    Complete by:  As directed      Call MD for:  redness,  tenderness, or signs of infection (pain, swelling, redness, odor or green/yellow discharge around incision site)    Complete by:  As directed      Call MD for:  severe uncontrolled pain    Complete by:  As directed      Call MD for:  temperature >100.4    Complete by:  As directed      Call MD for:    Complete by:  As directed      Diet - low sodium heart healthy    Complete by:  As directed      Increase activity slowly    Complete by:  As directed           Current Discharge Medication List    START taking these medications   Details   cefUROXime (CEFTIN) 250 MG tablet Take 1 tablet (250 mg total) by mouth daily with supper. Qty: 12 tablet, Refills: 0    HYDROcodone-acetaminophen (NORCO) 5-325 MG tablet Take 1 tablet by mouth every 6 (six) hours as needed for moderate pain. Qty: 5 tablet, Refills: 0      CONTINUE these medications which have NOT CHANGED   Details  amLODipine (NORVASC) 5 MG tablet Take 2.5 mg by mouth daily.     atorvastatin (LIPITOR) 20 MG tablet Take 20 mg by mouth at bedtime.    cetirizine (ZYRTEC) 10 MG tablet Take 10 mg by mouth daily.    cholecalciferol (VITAMIN D) 1000 UNITS tablet Take 1,000 Units by mouth daily.    cholestyramine (QUESTRAN) 4 G packet Take 1 packet (4 g total) by mouth 3 (three) times daily with meals. Qty: 60 each, Refills: 12    Cyanocobalamin (VITAMIN B 12 PO) Take 2,500 mg by mouth daily.     fenofibrate 160 MG tablet Take 160 mg by mouth at bedtime.    gabapentin (NEURONTIN) 300 MG capsule Take 300 mg by mouth 3 (three) times daily.     Insulin Glargine (TOUJEO SOLOSTAR [place]) Inject 60 Units into the skin daily at 6 (six) AM.     lidocaine-prilocaine (EMLA) cream Apply 1 application topically as needed. Qty: 30 g, Refills: 3    magnesium oxide (MAG-OX) 400 MG tablet Take 400 mg by mouth 2 (two) times daily.    metFORMIN (GLUCOPHAGE) 1000 MG tablet Take 1,000 mg by mouth 2 (two) times daily with a meal.     Multiple Vitamins-Minerals (MULTIVITAMIN WITH MINERALS) tablet Take 1 tablet by mouth daily.    Rivaroxaban (XARELTO) 15 MG TABS tablet Take 15 mg by mouth every evening.     sertraline (ZOLOFT) 100 MG tablet Take 100 mg by mouth daily.    vitamin C (ASCORBIC ACID) 500 MG tablet Take 500 mg by mouth daily.      STOP taking these medications     allopurinol (ZYLOPRIM) 300 MG tablet      PRESCRIPTION MEDICATION        Allergies  Allergen Reactions  . Niacin Other (See Comments)    headaches   Follow-up Information    Follow up with BORDEN,LES,  MD In 1 week.   Specialty:  Urology   Contact information:   [place] [address] [phone]       Follow up with [name], MD In 1 week.   Specialty:  Oncology   Contact information:   [place] [address] [phone]       Follow up with Michael Pel, MD In 2  weeks.   Specialty:  Internal Medicine   Contact information:   [address] [place]  [phone]        The results of significant diagnostics from this hospitalization (including imaging, microbiology, ancillary and laboratory) are listed below for reference.    Significant Diagnostic Studies: Dg Chest 2 View  02/04/2015  CLINICAL DATA:  Fever. EXAM: CHEST  2 VIEW COMPARISON:  Chest x-rays dated 08/16/2013 and 06/18/2013 FINDINGS: Power port is in good position. Heart size and pulmonary vascularity are normal. Lungs are clear. No acute osseous abnormality. IMPRESSION: No active cardiopulmonary disease. Electronically Signed   By: Lorriane Shire M.D.   On: 02/04/2015 17:51   US Renal  02/05/2015  CLINICAL DATA:  Hematuria EXAM: RENAL / URINARY TRACT ULTRASOUND COMPLETE COMPARISON:  None. FINDINGS: Right Kidney: Length: 12.1 cm. Normal renal cortical thickness and echogenicity. No focal renal lesions. Moderate hydronephrosis. Left Kidney: Length: 12.6 cm. Normal renal cortical thickness and echogenicity without focal lesions or definite renal calculi. Marked hydroureteronephrosis. Bladder: Layering debris in the bladder and a right-sided ureterocele. No obvious mass or calculi. No ureteral jets were documented. IMPRESSION: Bilateral hydronephrosis, left greater than right with left hydroureter also noted. Layering debris in the bladder and right-sided ureterocele. Electronically Signed   By: Marijo Sanes M.D.   On: 02/05/2015 11:22    Microbiology: Recent Results (from the past 240 hour(s))  Urine culture     Status: None    Collection Time: 02/04/15  4:49 PM  Result Value Ref Range Status   Specimen Description URINE, CLEAN CATCH  Final   Special Requests NONE  Final   Culture   Final    >=100,000 COLONIES/mL ESCHERICHIA COLI Performed at [facility]    Report Status 02/06/2015 FINAL  Final   Organism ID, Bacteria ESCHERICHIA COLI  Final      Susceptibility   Escherichia coli - MIC*    AMPICILLIN <=2 SENSITIVE Sensitive     CEFAZOLIN <=4 SENSITIVE Sensitive     CEFTRIAXONE <=1 SENSITIVE Sensitive     CIPROFLOXACIN <=0.25 SENSITIVE Sensitive     GENTAMICIN <=1 SENSITIVE Sensitive     IMIPENEM <=0.25 SENSITIVE Sensitive     NITROFURANTOIN <=16 SENSITIVE Sensitive     TRIMETH/SULFA <=20 SENSITIVE Sensitive     AMPICILLIN/SULBACTAM <=2 SENSITIVE Sensitive     PIP/TAZO <=4 SENSITIVE Sensitive     * >=100,000 COLONIES/mL ESCHERICHIA COLI  Blood Culture (routine x 2)     Status: None (Preliminary result)   Collection Time: 02/04/15  5:00 PM  Result Value Ref Range Status   Specimen Description BLOOD RIGHT ANTECUBITAL  Final   Special Requests BOTTLES DRAWN AEROBIC AND ANAEROBIC 5 ML  Final   Culture   Final    NO GROWTH 3 DAYS Performed at [facility]    Report Status PENDING  Incomplete  Blood Culture (routine x 2)     Status: None (Preliminary result)   Collection Time: 02/04/15  5:25 PM  Result Value Ref Range Status   Specimen Description BLOOD PORTA CATH  Final   Special Requests BOTTLES DRAWN AEROBIC AND ANAEROBIC 5 ML  Final   Culture   Final    NO GROWTH 3 DAYS Performed at [facility]    Report Status PENDING  Incomplete  Surgical pcr screen     Status: None   Collection Time: 02/05/15  3:19 PM  Result Value Ref Range Status   MRSA, PCR NEGATIVE NEGATIVE  Final   Staphylococcus aureus NEGATIVE NEGATIVE Final    Comment:        The Xpert SA Assay (FDA approved for NASAL specimens in patients over 8 years of age), is one component of a comprehensive  surveillance program.  Test performance has been validated by [facility] for patients greater than or equal to 74 year old. It is not intended to diagnose infection nor to guide or monitor treatment.      Labs: Basic Metabolic Panel:  Recent Labs Lab 02/04/15 1658 02/05/15 0448 02/06/15 0517 02/07/15 0440 02/08/15 0600  NA 129* 133* 136 134* 138  K 4.3 3.8 3.9 3.3* 3.9  CL 103 105 109 103 110  CO2 19* 20* 20* 22 20*  GLUCOSE 176* 169* 240* 303* 248*  BUN 46* 44* 47* 49* 47*  CREATININE 2.78* 2.64* 2.38* 2.63* 2.18*  CALCIUM 8.9 8.4* 8.9 8.7* 8.9  MG  --  1.7  --   --   --   PHOS  --  2.3*  --   --   --    Liver Function Tests:  Recent Labs Lab 02/04/15 1658 02/05/15 0448  AST 30 32  ALT 26 28  ALKPHOS 57 52  BILITOT 0.9 0.6  PROT 7.2 6.3*  ALBUMIN 4.0 3.4*   No results for input(s): LIPASE, AMYLASE in the last 168 hours. No results for input(s): AMMONIA in the last 168 hours. CBC:  Recent Labs Lab 02/04/15 1658 02/05/15 0448 02/06/15 0517 02/07/15 0440 02/08/15 0600  WBC 7.1 4.9 4.6 4.4 4.5  NEUTROABS 6.3  --   --   --   --   HGB 9.8* 8.7* 8.9* 8.9* 8.9*  HCT 29.6* 25.6* 26.5* 26.3* 26.8*  MCV 87.8 87.4 87.2 86.8 87.9  PLT 134* 96* 124* 130* 139*   Cardiac Enzymes:  Recent Labs Lab 02/04/15 1658  TROPONINI <0.03   BNP: BNP (last 3 results) No results for input(s): BNP in the last 8760 hours.  ProBNP (last 3 results) No results for input(s): PROBNP in the last 8760 hours.  CBG:  Recent Labs Lab 02/07/15 0736 02/07/15 1148 02/07/15 1654 02/07/15 2319 02/08/15 0800  GLUCAP 261* 292* 330* 285* 149*       Signed:  Tully Burgo  [facility] 02/08/2015, 10:01 AM

## 2015-02-09 ENCOUNTER — Encounter (HOSPITAL_COMMUNITY): Payer: Self-pay | Admitting: Urology

## 2015-02-09 ENCOUNTER — Inpatient Hospital Stay (HOSPITAL_COMMUNITY)
Admission: RE | Admit: 2015-02-09 | Discharge: 2015-02-09 | Disposition: A | Discharge status: Home / Self Care | Payer: Medicare Other | Source: Ambulatory Visit

## 2015-02-09 ENCOUNTER — Other Ambulatory Visit: Payer: Self-pay | Admitting: Oncology

## 2015-02-09 LAB — CULTURE, BLOOD (ROUTINE X 2)
CULTURE: NO GROWTH
Culture: NO GROWTH

## 2015-02-16 ENCOUNTER — Ambulatory Visit (HOSPITAL_COMMUNITY): Admission: RE | Admit: 2015-02-16 | Payer: Medicare Other | Source: Ambulatory Visit | Admitting: Urology

## 2015-02-16 ENCOUNTER — Encounter (HOSPITAL_COMMUNITY): Admission: RE | Payer: Self-pay | Source: Ambulatory Visit

## 2015-02-16 SURGERY — TURBT (TRANSURETHRAL RESECTION OF BLADDER TUMOR)
Anesthesia: Choice

## 2015-02-19 ENCOUNTER — Other Ambulatory Visit (HOSPITAL_COMMUNITY): Payer: Self-pay | Admitting: Urology

## 2015-02-19 DIAGNOSIS — C61 Malignant neoplasm of prostate: Secondary | ICD-10-CM | POA: Diagnosis not present

## 2015-02-19 DIAGNOSIS — N1330 Unspecified hydronephrosis: Secondary | ICD-10-CM | POA: Diagnosis not present

## 2015-02-19 DIAGNOSIS — N390 Urinary tract infection, site not specified: Secondary | ICD-10-CM | POA: Diagnosis not present

## 2015-02-25 ENCOUNTER — Other Ambulatory Visit (HOSPITAL_BASED_OUTPATIENT_CLINIC_OR_DEPARTMENT_OTHER): Payer: Medicare Other

## 2015-02-25 ENCOUNTER — Ambulatory Visit (HOSPITAL_BASED_OUTPATIENT_CLINIC_OR_DEPARTMENT_OTHER): Payer: Medicare Other | Admitting: Nurse Practitioner

## 2015-02-25 ENCOUNTER — Ambulatory Visit: Payer: Medicare Other

## 2015-02-25 ENCOUNTER — Encounter: Payer: Self-pay | Admitting: Nurse Practitioner

## 2015-02-25 ENCOUNTER — Telehealth: Payer: Self-pay | Admitting: Nurse Practitioner

## 2015-02-25 ENCOUNTER — Ambulatory Visit (HOSPITAL_BASED_OUTPATIENT_CLINIC_OR_DEPARTMENT_OTHER): Payer: Medicare Other

## 2015-02-25 VITALS — BP 121/51 | HR 72 | Temp 97.8°F | Resp 18 | Ht 68.0 in | Wt 181.6 lb

## 2015-02-25 DIAGNOSIS — C8590 Non-Hodgkin lymphoma, unspecified, unspecified site: Secondary | ICD-10-CM

## 2015-02-25 DIAGNOSIS — I4891 Unspecified atrial fibrillation: Secondary | ICD-10-CM

## 2015-02-25 DIAGNOSIS — C8580 Other specified types of non-Hodgkin lymphoma, unspecified site: Secondary | ICD-10-CM

## 2015-02-25 DIAGNOSIS — C8593 Non-Hodgkin lymphoma, unspecified, intra-abdominal lymph nodes: Secondary | ICD-10-CM

## 2015-02-25 DIAGNOSIS — Z95828 Presence of other vascular implants and grafts: Secondary | ICD-10-CM

## 2015-02-25 DIAGNOSIS — C61 Malignant neoplasm of prostate: Secondary | ICD-10-CM | POA: Diagnosis not present

## 2015-02-25 DIAGNOSIS — Z5112 Encounter for antineoplastic immunotherapy: Secondary | ICD-10-CM | POA: Diagnosis not present

## 2015-02-25 LAB — COMPREHENSIVE METABOLIC PANEL (CC13)
ALBUMIN: 4 g/dL (ref 3.5–5.0)
ALK PHOS: 59 U/L (ref 40–150)
ALT: 34 U/L (ref 0–55)
AST: 35 U/L — ABNORMAL HIGH (ref 5–34)
Anion Gap: 9 mEq/L (ref 3–11)
BILIRUBIN TOTAL: 0.52 mg/dL (ref 0.20–1.20)
BUN: 26.7 mg/dL — ABNORMAL HIGH (ref 7.0–26.0)
CALCIUM: 9.8 mg/dL (ref 8.4–10.4)
CO2: 23 mEq/L (ref 22–29)
Chloride: 106 mEq/L (ref 98–109)
Creatinine: 2 mg/dL — ABNORMAL HIGH (ref 0.7–1.3)
EGFR: 30 mL/min/{1.73_m2} — AB (ref >90)
GLUCOSE: 206 mg/dL — AB (ref 70–140)
Potassium: 4.1 mEq/L (ref 3.5–5.1)
SODIUM: 138 meq/L (ref 136–145)
TOTAL PROTEIN: 6.6 g/dL (ref 6.4–8.3)

## 2015-02-25 LAB — CBC WITH DIFFERENTIAL/PLATELET
BASO%: 0.2 % (ref 0.0–2.0)
BASOS ABS: 0 10*3/uL (ref 0.0–0.1)
EOS ABS: 0.2 10*3/uL (ref 0.0–0.5)
EOS%: 4.9 % (ref 0.0–7.0)
HEMATOCRIT: 29.4 % — AB (ref 38.4–49.9)
HEMOGLOBIN: 9.7 g/dL — AB (ref 13.0–17.1)
LYMPH%: 4.7 % — ABNORMAL LOW (ref 14.0–49.0)
MCH: 29.5 pg (ref 27.2–33.4)
MCHC: 33 g/dL (ref 32.0–36.0)
MCV: 89.4 fL (ref 79.3–98.0)
MONO#: 0.5 10*3/uL (ref 0.1–0.9)
MONO%: 9.9 % (ref 0.0–14.0)
NEUT%: 80.3 % — ABNORMAL HIGH (ref 39.0–75.0)
NEUTROS ABS: 3.7 10*3/uL (ref 1.5–6.5)
PLATELETS: 150 10*3/uL (ref 140–400)
RBC: 3.29 10*6/uL — ABNORMAL LOW (ref 4.20–5.82)
RDW: 15.4 % — AB (ref 11.0–14.6)
WBC: 4.7 10*3/uL (ref 4.0–10.3)
lymph#: 0.2 10*3/uL — ABNORMAL LOW (ref 0.9–3.3)

## 2015-02-25 MED ORDER — HEPARIN SOD (PORK) LOCK FLUSH 100 UNIT/ML IV SOLN
500.0000 [IU] | Freq: Once | INTRAVENOUS | Status: AC | PRN
  Administered 2015-02-25: 500 [IU]
  Filled 2015-02-25: qty 5

## 2015-02-25 MED ORDER — SODIUM CHLORIDE 0.9 % IV SOLN
375.0000 mg/m2 | Freq: Once | INTRAVENOUS | Status: AC
  Administered 2015-02-25: 800 mg via INTRAVENOUS
  Filled 2015-02-25: qty 80

## 2015-02-25 MED ORDER — DIPHENHYDRAMINE HCL 25 MG PO CAPS
25.0000 mg | ORAL_CAPSULE | Freq: Once | ORAL | Status: AC
  Administered 2015-02-25: 25 mg via ORAL

## 2015-02-25 MED ORDER — ACETAMINOPHEN 325 MG PO TABS
ORAL_TABLET | ORAL | Status: AC
  Filled 2015-02-25: qty 2

## 2015-02-25 MED ORDER — SODIUM CHLORIDE 0.9 % IJ SOLN
10.0000 mL | INTRAMUSCULAR | Status: DC | PRN
  Administered 2015-02-25: 10 mL
  Filled 2015-02-25: qty 10

## 2015-02-25 MED ORDER — SODIUM CHLORIDE 0.9 % IJ SOLN
10.0000 mL | INTRAMUSCULAR | Status: DC | PRN
  Administered 2015-02-25: 10 mL via INTRAVENOUS
  Filled 2015-02-25: qty 10

## 2015-02-25 MED ORDER — ACETAMINOPHEN 325 MG PO TABS
650.0000 mg | ORAL_TABLET | Freq: Once | ORAL | Status: AC
  Administered 2015-02-25: 650 mg via ORAL

## 2015-02-25 MED ORDER — SODIUM CHLORIDE 0.9 % IV SOLN
Freq: Once | INTRAVENOUS | Status: AC
  Administered 2015-02-25: 09:00:00 via INTRAVENOUS

## 2015-02-25 MED ORDER — DIPHENHYDRAMINE HCL 25 MG PO CAPS
ORAL_CAPSULE | ORAL | Status: AC
  Filled 2015-02-25: qty 1

## 2015-02-25 NOTE — Patient Instructions (Signed)

## 2015-02-25 NOTE — Progress Notes (Signed)
ID: [name] OB: 1932-02-10  MR#: [id number]  INO#:[id number]  PCP: Horatio Pel, MD GYN:   SU: [name] OTHER MD: [name], [name],  [name], [name], [name]  CHIEF COMPLAINT: Non-Hodgkin's lymphoma  CURRENT TREATMENT: Maintenance rituximab  NON-HODGKIN"S LYMPHOMA HISTORY: From the prior summary:  The patient developed right upper quadrant pain last year and he had an ultrasound April 5th which showed some gallstones without evidence of cholecystitis or ductal dilatation.  However, there were multiple lesions in the liver which could not be assessed further.  Accordingly on July 31, 2003, [name], showed multiple liver masses, more than 25, most over 1 cm, the largest being in the inferior right lobe, measuring 4.2 cm. There was also a small mass in the spleen.  CT of the pelvis was unremarkable.   The patient had a biopsy of the liver August 01, 2003.  The report says only that it was "a lesion in the right lobe of the liver".  Presumably this was the largest lesion present.  The final pathology [id number] and [phone]) showed only cirrhosis.     The patient has been followed by [name], and a repeat CT scan of the abdomen and pelvis was obtained May 18, 2004.  Many of the liver lesions seen previously had actually decreased in size.  However, [name], [name], a second ultrasound-guided biopsy was performed 06/11/04. This was a lesion deep in the left lobe of the liver and therefore, I would think not the same one previously biopsied which was in the right liver.  The pathology this time [id number]) shows a poorly differentiated neuroendocrine carcinoma which was positive for chromogranin A, negative for synaptophysin, thyroid transcription factor, a variety of cytokeratins, [name], alpha-fetoprotein and COX-2."  Mr. Mcnelly was subsequently evaluated at [facility] by Dr. Otelia Limes and repeat liver biopsy and review of the earlier biopsy here showed a primary hepatic lymphoma. The patient was treated with R-CHOP as detailed below and achieved a complete response. He received maintenance rituximab until 2008  His subsequent history is as detailed below  INTERVAL HISTORY. [name] returns today follow up of his Non-Hodkin's lymphoma, accompanied by one of his daughters. Today is day 1 cycle 10 of rituximab maintenance given every 8 weeks, to be continued indefinitely. This dose was pushed back by 3 weeks because the patient was admitted to the hospital with a 102F fever. He was given IV antibiotics. He developed post obstructive nephropathy and required bilateral placed because of a bladder mass. This was determined to be the spread of prostate cancer.   REVIEW OF SYSTEMS:  [name] has not slowed down any as the result of his hospitalization. He continues to work in the yard and is active with his garden. He denies fevers, chills ,nausea, vomiting, or changes in bowel habits. He has no hot flashes, night sweats, unusual weight loss, or fatigue. She has occasional shortness of breath, but denies chest pain, cough, or palpitations. His headaches are infrequent. He denies dizziness or weakness. A detailed review of systems is otherwise stable.  PAST MEDICAL HISTORY: Past Medical History  Diagnosis Date  . Hypertension   . Cancer of liver ([place])   . Kidney stone   . Prostate cancer ([place])   . Skin cancer   . Arthritis   . Anemia   . GERD (gastroesophageal reflux disease)   . Lymphoma ([place])   . [name]   . Neuropathy in diabetes ([place])     Hx: of  . Diabetes  mellitus     INSULIN DEPENDENT  . HX, PERSONAL, MALIGNANCY, PROSTATE 07/28/2006    Annotation: 2001, resected Qualifier: Diagnosis of  By: [name], [name]    . A-fib ([place])   . Near syncope 10/18/2013  . Memory deficit 10/18/2013  . HOH (hard of hearing)   . Sleep apnea     on CPAP   Significant for prostate cancer, the patient undergoing prostatectomy January 2001 under [facility] for a Gleason 7, pathologic T3b (positive seminal vesicle involvement) adenocarcinoma with 0 of 2 lymph nodes involved.  Current PSA is followed by Dr. [name]. Other medical problems include sleep apnea, minor coronary artery disease, hypertension, hypertriglyceridemia, cholelithiasis, colon polyps, cirrhosis by biopsy, diabetes, [name], history of nephrolithiasis, status post right renal surgery and history of left rotator cuff repair under [name].    PAST SURGICAL HISTORY: Past Surgical History  Procedure Laterality Date  . Prostatectomy    . Kidney stone surgery    . Rotator cuff repair    . Ankle surgery    . Cardiac catheterization  09/16/96    Normal LV systolic function,dense ca+ prox. portion of the LAD w/50% narrowing in the distal portion, 30-40% irreg. in the proximal portion & 80% narrowing in the ostial portion of the posterolateral branch.  . Colonoscopy      Hx: of  . Infusion port  04/04/2013    RIGHT SUBCLAVIAN  . Cholecystectomy  04/04/2013  . Cholecystectomy N/A 04/04/2013    Procedure: LAPAROSCOPIC CHOLECYSTECTOMY WITH INTRAOPERATIVE CHOLANGIOGRAM;  [name], MD;  Location: [place];  Service: General;  [name];  . Portacath placement N/A 04/04/2013    Procedure: INSERTION PORT-A-CATH;  [name], MD;  Location: [place];  Service: General;  [name];  . Cystoscopy with stent placement Bilateral 02/05/2015    Procedure: CYSTOSCOPY RETROGRADE AND BILATERAL  STENT PLACEMENT;  Surgeon:  Kathie Rhodes, MD;  Location: [facility];  Service: Urology;  Laterality: Bilateral;  . Transurethral resection of bladder tumor with gyrus (turbt-gyrus) N/A 02/05/2015    Procedure: TRANSURETHRAL RESECTION OF BLADDER TUMOR  ;  Surgeon: Kathie Rhodes, MD;  Location: [facility];  Service: Urology;  [name];    FAMILY HISTORY Family History  Problem Relation Age of Onset  . Heart disease Father   . Heart attack Father   . Cancer Sister   . Heart attack Brother   . Heart attack Brother   . Heart attack Brother   . Heart attack Brother   . Heart attack Brother   . Cancer Brother   . Cancer Brother   The patient's father died at the age of 71 from an MI.  The patient's mother died from "old age" at 64.  The patient is one of nine siblings.  One brother died from cancer of the esophagus, 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  He is now retired.  His wife, [name], a homemaker, died in June 12, 2012: [name] is an Scientist, physiological for [employer]; [name], [name]; and [name] is Environmental consultant.  Everybody lives in [place].  The patient has five grandchildren.  He is a member of [place]. [place].    ADVANCED DIRECTIVES: in place   HEALTH MAINTENANCE: Social History  Substance Use Topics  . Smoking status: Former Smoker    Types: Pipe  . Smokeless tobacco: Former Systems developer    Types: Chew     Comment: [place] YEARS AGO "  . Alcohol Use: No     Colonoscopy:  PSA: Followed by Dr. [name]  Bone density:  Lipid panel:  Allergies  Allergen Reactions  . Niacin Other (See Comments)    headaches    Current Outpatient Prescriptions  Medication Sig Dispense Refill  . amLODipine  (NORVASC) 5 MG tablet Take 2.5 mg by mouth daily.     Marland Kitchen atorvastatin (LIPITOR) 20 MG tablet Take 20 mg by mouth at bedtime.    . Calcium-Magnesium-Vitamin D (CALCIUM 1200+D3 PO) Take 2 tablets by mouth daily.    . cetirizine (ZYRTEC) 10 MG tablet Take 10 mg by mouth daily.    . Cyanocobalamin (VITAMIN B 12 PO) Take 2,500 mg by mouth daily.     . fenofibrate 160 MG tablet Take 160 mg by mouth at bedtime.    . gabapentin (NEURONTIN) 300 MG capsule Take 300 mg by mouth 3 (three) times daily.     . Insulin Glargine (TOUJEO SOLOSTAR [place]) Inject 60 Units into the skin daily at 6 (six) AM.     . lidocaine-prilocaine (EMLA) cream Apply 1 application topically as needed. (Patient taking differently: Apply 1 application topically as needed (port). ) 30 g 3  . magnesium oxide (MAG-OX) 400 MG tablet Take 400 mg by mouth 2 (two) times daily.    . metFORMIN (GLUCOPHAGE) 1000 MG tablet Take 1,000 mg by mouth 2 (two) times daily with a meal.     . Multiple Vitamins-Minerals (MULTIVITAMIN WITH MINERALS) tablet Take 1 tablet by mouth daily.    . Rivaroxaban (XARELTO) 15 MG TABS tablet Take 15 mg by mouth every evening.     . sertraline (ZOLOFT) 100 MG tablet Take 100 mg by mouth daily.    . vitamin C (ASCORBIC ACID) 500 MG tablet Take 500 mg by mouth daily.    . cholestyramine (QUESTRAN) 4 G packet Take 1 packet (4 g total) by mouth 3 (three) times daily with meals. (Patient not taking: Reported on 02/25/2015) 60 each 12  . HYDROcodone-acetaminophen (NORCO) 5-325 MG tablet Take 1 tablet by mouth every 6 (six) hours as needed for moderate pain. (Patient not taking: Reported on 02/25/2015) 5 tablet 0   No current facility-administered medications for this visit.    Objective: Older white man who appears well  Filed Vitals:   02/25/15 0837  BP: 121/51  Pulse: 72  Temp: 97.8 F (36.6 C)  Resp: 18     Body mass index is 27.62 kg/(m^2).      ECOG FS:0 - Asymptomatic  Skin: warm, dry  HEENT: sclerae  anicteric, conjunctivae pink, oropharynx clear. No thrush or mucositis.  Lymph Nodes: No cervical, supraclavicular, axillary, or inguinal lymphadenopathy  Lungs: clear to auscultation bilaterally, no rales,  wheezes, or rhonci  Heart: regular rate and rhythm  Abdomen: round, soft, non tender, positive bowel sounds  Musculoskeletal: No focal spinal tenderness, no peripheral edema  Neuro: non focal, well oriented, positive affect       LAB RESULTS:  CMP     Component Value Date/Time   NA 138 02/25/2015 0818   NA 138 02/08/2015 0600   K 4.1 02/25/2015 0818   K 3.9 02/08/2015 0600   CL 110 02/08/2015 0600   CO2 23 02/25/2015 0818   CO2 20* 02/08/2015 0600   GLUCOSE 206* 02/25/2015 0818   GLUCOSE 248* 02/08/2015 0600   BUN 26.7* 02/25/2015 0818   BUN 47* 02/08/2015 0600   CREATININE 2.0* 02/25/2015 0818   CREATININE 2.18* 02/08/2015 0600   CALCIUM 9.8 02/25/2015 0818   CALCIUM 8.9 02/08/2015 0600   CALCIUM 11.0* 08/17/2005 0842   PROT 6.6 02/25/2015 0818   PROT 6.3* 02/05/2015 0448   ALBUMIN 4.0 02/25/2015 0818   ALBUMIN 3.4* 02/05/2015 0448   AST 35* 02/25/2015 0818   AST 32 02/05/2015 0448   ALT 34 02/25/2015 0818   ALT 28 02/05/2015 0448   ALKPHOS 59 02/25/2015 0818   ALKPHOS 52 02/05/2015 0448   BILITOT 0.52 02/25/2015 0818   BILITOT 0.6 02/05/2015 0448   GFRNONAA 27* 02/08/2015 0600   GFRAA 31* 02/08/2015 0600    No results found for: SPEP  Lab Results  Component Value Date   WBC 4.7 02/25/2015   NEUTROABS 3.7 02/25/2015   HGB 9.7* 02/25/2015   HCT 29.4* 02/25/2015   MCV 89.4 02/25/2015   PLT 150 02/25/2015      Chemistry      Component Value Date/Time   NA 138 02/25/2015 0818   NA 138 02/08/2015 0600   K 4.1 02/25/2015 0818   K 3.9 02/08/2015 0600   CL 110 02/08/2015 0600   CO2 23 02/25/2015 0818   CO2 20* 02/08/2015 0600   BUN 26.7* 02/25/2015 0818   BUN 47* 02/08/2015 0600   CREATININE 2.0* 02/25/2015 0818   CREATININE 2.18* 02/08/2015 0600       Component Value Date/Time   CALCIUM 9.8 02/25/2015 0818   CALCIUM 8.9 02/08/2015 0600   CALCIUM 11.0* 08/17/2005 0842   ALKPHOS 59 02/25/2015 0818   ALKPHOS 52 02/05/2015 0448   AST 35* 02/25/2015 0818   AST 32 02/05/2015 0448   ALT 34 02/25/2015 0818   ALT 28 02/05/2015 0448   BILITOT 0.52 02/25/2015 0818   BILITOT 0.6 02/05/2015 0448      Urinalysis    Component Value Date/Time   COLORURINE YELLOW 02/04/2015 1649   APPEARANCEUR TURBID* 02/04/2015 1649   LABSPEC 1.017 02/04/2015 1649   PHURINE 5.5 02/04/2015 1649   GLUCOSEU 250* 02/04/2015 1649   HGBUR LARGE* 02/04/2015 1649   BILIRUBINUR NEGATIVE 02/04/2015 1649   KETONESUR NEGATIVE 02/04/2015 1649   PROTEINUR 100* 02/04/2015 1649   UROBILINOGEN 0.2 02/04/2015 1649   NITRITE POSITIVE* 02/04/2015 1649   LEUKOCYTESUR LARGE* 02/04/2015 1649    STUDIES: Dg Chest 2 View  02/04/2015  CLINICAL DATA:  Fever. EXAM: CHEST  2 VIEW COMPARISON:  Chest x-rays dated 08/16/2013 and 06/18/2013 FINDINGS: Power port is in good position. Heart size and pulmonary vascularity are normal. Lungs are clear. No acute osseous abnormality. IMPRESSION: No active cardiopulmonary disease. Electronically Signed   By: Lorriane Shire M.D.   On: 02/04/2015 17:51   US Renal  02/05/2015  CLINICAL DATA:  Hematuria EXAM: RENAL / URINARY TRACT  ULTRASOUND COMPLETE COMPARISON:  None. FINDINGS: Right Kidney: Length: 12.1 cm. Normal renal cortical thickness and echogenicity. No focal renal lesions. Moderate hydronephrosis. Left Kidney: Length: 12.6 cm. Normal renal cortical thickness and echogenicity without focal lesions or definite renal calculi. Marked hydroureteronephrosis. Bladder: Layering debris in the bladder and a right-sided ureterocele. No obvious mass or calculi. No ureteral jets were documented. IMPRESSION: Bilateral hydronephrosis, left greater than right with left hydroureter also noted. Layering debris in the bladder and right-sided ureterocele.  Electronically Signed   By: Marijo Sanes M.D.   On: 02/05/2015 11:22     ASSESSMENT:  81 y.o. patient with a diagnosis of    #1 Primary hepatic non-Hodgkin's lymphoma established through liver biopsy February 2006, treated with Rituxan x9 and then CHOP x6.  All chemotherapy completed in 2007.  Maintenance Rituxan discontinued October 2008.     #2  Biopsy proven retroperitoneal recurrence documented November 2014. Bone marrow biopsy 04/01/2013 was negative   #3 status post  laparoscopic cholecystectomy and port placement on 04/04/2013.  #4 Cycle #1  RICE chemotherapy  completed on 05/02/2013.   #5 cycle #2 RICE chemotherapy  on 05/17/2013  #6 PET scan performed on 05/23/2013 revealed marked partial response to chemotherapy with a retroperitoneal mass decrease in metabolic activity from SUV of 13.2 to 6.3. Right adrenal mass size and metabolic activity is resolved when compared to the previous PET scan.   #7 Evaluated at [facility] by Dr. Tomasa Hosteller for autologous transplant on 05/27/2013 and was quoted a 3% chance of significant heart damage and possibly death from the treatment and a 50% chance of being alive 5 years from now after transplant (versus perhaps 2 years without). After much thought the patient and family have decided firmly they do not wish to proceed to stem cell transplant.  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 treated with vancomycin IV x14 days completed 07/02/2013, followed by doxycycline for 2 additional weeks  #10 started maintenance Rituxan May 2015 (1 dose every 8 weeks)  #11 A-fib, on rivaroxaban  #12 prostate cancer-- per Dr [name]  PLAN: [facility] is doing well as far as his lymphoma is concerned. The labs were reviewed in detail and are stable. We will proceed with his next dose of rituxan as planned.   In the meantime he will continue to work with Dr. [name] regarding his prostate cancer  recurrence. He is already on lupron and is scheduled for a bone scan later this month to assess for further metastasis.   [name] will return in 4 weeks for follow up with Dr. Jana Hakim and in 8 weeks for his next rituxan dose. The visits will be split due to scheduling conflicts. He understands and agrees with this plan. He knows the goal of treatment in his case is control. He has been encouraged to call with any issues that might arise before his next visit here.   Laurie Panda, NP   02/25/2015

## 2015-02-25 NOTE — Telephone Encounter (Signed)
Appointments made and avs will be printed in chemo at end of tx

## 2015-02-25 NOTE — Patient Instructions (Signed)

## 2015-03-02 DIAGNOSIS — C61 Malignant neoplasm of prostate: Secondary | ICD-10-CM | POA: Diagnosis not present

## 2015-03-02 DIAGNOSIS — E1140 Type 2 diabetes mellitus with diabetic neuropathy, unspecified: Secondary | ICD-10-CM | POA: Diagnosis not present

## 2015-03-02 DIAGNOSIS — E1121 Type 2 diabetes mellitus with diabetic nephropathy: Secondary | ICD-10-CM | POA: Diagnosis not present

## 2015-03-02 DIAGNOSIS — L03011 Cellulitis of right finger: Secondary | ICD-10-CM | POA: Diagnosis not present

## 2015-03-09 ENCOUNTER — Encounter (HOSPITAL_COMMUNITY)
Admission: RE | Admit: 2015-03-09 | Discharge: 2015-03-09 | Disposition: A | Discharge status: Home / Self Care | Payer: Medicare Other | Source: Ambulatory Visit | Attending: Urology | Admitting: Urology

## 2015-03-09 DIAGNOSIS — C61 Malignant neoplasm of prostate: Secondary | ICD-10-CM | POA: Diagnosis not present

## 2015-03-09 DIAGNOSIS — L03031 Cellulitis of right toe: Secondary | ICD-10-CM | POA: Diagnosis not present

## 2015-03-09 MED ORDER — TECHNETIUM TC 99M MEDRONATE IV KIT
27.0000 | PACK | Freq: Once | INTRAVENOUS | Status: AC | PRN
  Administered 2015-03-09: 27 via INTRAVENOUS

## 2015-03-13 ENCOUNTER — Ambulatory Visit (INDEPENDENT_AMBULATORY_CARE_PROVIDER_SITE_OTHER): Payer: Medicare Other | Admitting: Podiatry

## 2015-03-13 ENCOUNTER — Encounter: Payer: Self-pay | Admitting: Podiatry

## 2015-03-13 VITALS — BP 125/66 | HR 88 | Resp 18

## 2015-03-13 DIAGNOSIS — L600 Ingrowing nail: Secondary | ICD-10-CM

## 2015-03-13 MED ORDER — CEPHALEXIN 500 MG PO CAPS: 500.0000 mg | ORAL_CAPSULE | Freq: Three times a day (TID) | ORAL | Status: DC

## 2015-03-13 NOTE — Patient Instructions (Signed)

## 2015-03-13 NOTE — Progress Notes (Signed)
Subjective:    Patient ID: [name], male    DOB: May 25, 1931, 81 y.o.   MRN: [id number]  HPI  81 year old male presents the office if his daughter for concerns of an infection of the right big toe. His daughter states that she bleeds that there is a paronychia present to the toenail. He has recently been on antibiotics for other issues and the area seemed to have gotten better however it has reoccurred. There is redness the big toeand the small amount of drainage coming from the nail border. He states the areas tender particularly with pressure in shoes. They said there has been pus coming from the area. Denies any red streaks. He was recently hospitalized for kidney infection. She is just wishes course of cephalexin. No other complaints at this time.  Review of Systems  HENT: Positive for hearing loss and sinus pressure.   Gastrointestinal: Positive for diarrhea.  Genitourinary: Positive for frequency.  Allergic/Immunologic: Positive for environmental allergies.  Hematological: Bruises/bleeds easily.  All other systems reviewed and are negative.      Objective:   Physical Exam General: AAO x3, NAD  Dermatological: Skin is warm, dry and supple bilateral. There is evidence of incurvation along the medial aspect of the right hallux toenail with tenderness to palpation overlying this area. There is erythema to the medial aspect of the hallux extending to the level of the MPJ however there is no ascending saline discussed MPJ. There is a small amount of purulent drainage coming from the nail border. There is granulation tissue within the nail border. There is no tenderness the lateral nail border there is not appear to be any drainage. There are no open sores, no preulcerative lesions, no rash or signs of infection present.  Vascular: DP 2/4, PT 1/4 bilaterally, CRT < 3 sec. Pedal hair growth present. No varicosities and no lower extremity edema present bilateral. There is no pain with  calf compression, swelling, warmth, erythema.   Neruologic: Grossly intact via light touch bilateral. Vibratory intact via tuning fork bilateral. Protective threshold with Semmes Wienstein monofilament intact to all pedal sites bilateral. Patellar and Achilles deep tendon reflexes 2+ bilateral. No Babinski or clonus noted bilateral.   Musculoskeletal: Tenderness to palpation upon the medial aspect right hallux toenail. No other areas of tenderness to bilateral lower extremity is. No gross boney pedal deformities bilateral. No pain, crepitus, or limitation noted with foot and ankle range of motion bilateral. Muscular strength 5/5 in all groups tested bilateral.  Gait: Unassisted, Nonantalgic.         Assessment & Plan:  81 year old male with right medial hallux infected ingrown toenail -Treatment options discussed including all alternatives, risks, and complications -At this time, recommended partial nail removal without chemical matricectomy to the medial due to infection. I discussed that given his decreased posterior tibial pulse is at risk of nonhealing however given the infection which is recurring we need to the infection under control. Risks and complications were discussed with the patient for which they understand and  verbally consent to the procedure. Under sterile conditions a total of 3 mL of a mixture of 2% lidocaine plain and 0.5% Marcaine plain was infiltrated in a hallux block fashion. Once anesthetized, the skin was prepped in sterile fashion. A tourniquet was then applied. Next the medial border of the hallux nail border was excised making sure to remove the entire offending nail border. Once the nail was removed, the area was debrided and the underlying skin was  intact. The area was irrigated and hemostasis was obtained.  A dry sterile dressing was applied. After application of the dressing the tourniquet was removed and there is found to be an immediate capillary refill time to the  digit. The patient tolerated the procedure well any complications. Post procedure instructions were discussed the patient for which he verbally understood. Follow-up in one week for nail check or sooner if any problems are to arise. Discussed signs/symptoms of worsening infection and directed to call the office immediately should any occur or go directly to the emergency room. In the meantime, encouraged to call the office with any questions, concerns, changes symptoms. -Refilled keflex  -If not adequate healing will order arterial studies.   Celesta Gentile, DPM

## 2015-03-16 ENCOUNTER — Encounter: Payer: Self-pay | Admitting: Podiatry

## 2015-03-17 ENCOUNTER — Ambulatory Visit (INDEPENDENT_AMBULATORY_CARE_PROVIDER_SITE_OTHER): Payer: Medicare Other | Admitting: Podiatry

## 2015-03-17 ENCOUNTER — Encounter: Payer: Self-pay | Admitting: Podiatry

## 2015-03-17 DIAGNOSIS — L600 Ingrowing nail: Secondary | ICD-10-CM

## 2015-03-17 NOTE — Patient Instructions (Signed)

## 2015-03-23 ENCOUNTER — Encounter: Payer: Self-pay | Admitting: Podiatry

## 2015-03-23 NOTE — Progress Notes (Signed)
Patient ID: [name], male   DOB: 1932-02-14, 81 y.o.   MRN: [id number]  Subjective: [name] is a 81 y.o.  male returns to office today for follow up evaluation after having right medial Hallux temporary nail avulsion performed due to infection. Patient has been soaking using epsom and applying topical antibiotic covered with bandaid daily. He states that he is doing much better. Has continue with antibiotics. He states the redness has decreased he has not noticed any red streaking. Denies any drainage or pus. Patient denies fevers, chills, nausea, vomiting. Denies any calf pain, chest pain, SOB.   Objective:  Vitals: Reviewed  General: Well developed, nourished, in no acute distress, alert and oriented x3   Dermatology: Skin is warm, dry and supple bilateral. Right medial hallux nail border appears to be clean, dry, with mild granular tissue and surrounding scab. The area pierced be healing uneventfully. There is decreased erythema around the nail border and there is no ascending cellulitis. There is no pain on the surgical site. There is no drainage or pus. There is no malodor.  Neurovascular status:  DP pulses 2/4, PT pulse 1/4, CRT < 3 seconds. No pain with calf compression bilateral.  Musculoskeletal: Decreased tenderness to palpation of the right medial hallux nail fold. Muscular strength within normal limits bilateral.   Assesement and Plan: S/p partial nail avulsion, doing well with resolving infection  -Continue soaking in epsom salts twice a day followed by antibiotic ointment and a band-aid. Can leave uncovered at night. Continue this until completely healed.  -Finish course of antibiotics -Monitor for any signs/symptoms of infection. Call the office immediately if any occur or go directly to the emergency room. Call with any questions/concerns. -If not adequate healing, will order arterial studies although at this time it does appear to be healing within normal  limits.  -Follow-up in 2 weeks or sooner if any problems arise. In the meantime, encouraged to call the office with any questions, concerns, change in symptoms.   Celesta Gentile, DPM

## 2015-03-31 ENCOUNTER — Ambulatory Visit (INDEPENDENT_AMBULATORY_CARE_PROVIDER_SITE_OTHER): Payer: Medicare Other | Admitting: Podiatry

## 2015-03-31 ENCOUNTER — Encounter: Payer: Self-pay | Admitting: Podiatry

## 2015-03-31 DIAGNOSIS — L600 Ingrowing nail: Secondary | ICD-10-CM

## 2015-03-31 DIAGNOSIS — L03031 Cellulitis of right toe: Secondary | ICD-10-CM

## 2015-04-01 ENCOUNTER — Encounter: Payer: Self-pay | Admitting: Podiatry

## 2015-04-01 NOTE — Progress Notes (Signed)
Patient ID: MARSHON MCANENY, male   DOB: 02/08/1932, 81 y.o.   MRN: [id number]  Subjective: SAADIQ OLMSTED is a 81 y.o.  male returns to office today for follow up evaluation after having right medial Hallux temporary nail avulsion performed due to infection.  He states Ace bandages course of antibiotics. He presents today with his daughter and both states that the redness has significantly improved is no swelling redness or drainage or any pus. He denies any pain is toenail. Patient denies fevers, chills, nausea, vomiting. Denies any calf pain, chest pain, SOB.   Objective:  Vitals: Reviewed  General: Well developed, nourished, in no acute distress, alert and oriented x3   Dermatology: Skin is warm, dry and supple bilateral. Right medial hallux nail border appears to be clean, dry, with small surrounding scab. The area appears to be healing uneventfully. There is significantly  decreased erythema around the nail border and there is no ascending cellulitis. There is no pain on the surgical site. There is no drainage or pus. There is no malodor.  Neurovascular status:  DP pulses 2/4, PT pulse 1/4, CRT < 3 seconds. No pain with calf compression bilateral.  Musculoskeletal: Decreased tenderness to palpation of the right medial hallux nail fold. Muscular strength within normal limits bilateral.   Assesement and Plan: S/p partial nail avulsion, doing well with resolving infection  -At this time it appears that the area is mostly healed. He continue soaking in Epson salts they desired however there appears to be healed at this time. Infection resolved. Monitor for reoccurrence. -Monitor for any signs/symptoms of infection. Call the office immediately if any occur or go directly to the emergency room. Call with any questions/concerns. -Follow-up prn.  Call with questions or concerns.  Celesta Gentile, DPM

## 2015-04-02 ENCOUNTER — Ambulatory Visit (HOSPITAL_BASED_OUTPATIENT_CLINIC_OR_DEPARTMENT_OTHER): Payer: Medicare Other | Admitting: Oncology

## 2015-04-02 ENCOUNTER — Other Ambulatory Visit (HOSPITAL_BASED_OUTPATIENT_CLINIC_OR_DEPARTMENT_OTHER): Payer: Medicare Other

## 2015-04-02 ENCOUNTER — Telehealth: Payer: Self-pay | Admitting: Oncology

## 2015-04-02 ENCOUNTER — Ambulatory Visit (HOSPITAL_BASED_OUTPATIENT_CLINIC_OR_DEPARTMENT_OTHER): Payer: Medicare Other

## 2015-04-02 ENCOUNTER — Ambulatory Visit: Payer: Medicare Other

## 2015-04-02 VITALS — BP 118/53 | HR 69 | Temp 98.6°F | Resp 18 | Ht 68.0 in | Wt 181.2 lb

## 2015-04-02 DIAGNOSIS — C8593 Non-Hodgkin lymphoma, unspecified, intra-abdominal lymph nodes: Secondary | ICD-10-CM

## 2015-04-02 DIAGNOSIS — C8580 Other specified types of non-Hodgkin lymphoma, unspecified site: Secondary | ICD-10-CM

## 2015-04-02 DIAGNOSIS — I4891 Unspecified atrial fibrillation: Secondary | ICD-10-CM

## 2015-04-02 DIAGNOSIS — N139 Obstructive and reflux uropathy, unspecified: Secondary | ICD-10-CM

## 2015-04-02 DIAGNOSIS — D631 Anemia in chronic kidney disease: Secondary | ICD-10-CM | POA: Insufficient documentation

## 2015-04-02 DIAGNOSIS — C61 Malignant neoplasm of prostate: Secondary | ICD-10-CM

## 2015-04-02 DIAGNOSIS — N183 Chronic kidney disease, stage 3 (moderate): Secondary | ICD-10-CM

## 2015-04-02 DIAGNOSIS — D649 Anemia, unspecified: Secondary | ICD-10-CM

## 2015-04-02 DIAGNOSIS — C8590 Non-Hodgkin lymphoma, unspecified, unspecified site: Secondary | ICD-10-CM

## 2015-04-02 DIAGNOSIS — N189 Chronic kidney disease, unspecified: Secondary | ICD-10-CM

## 2015-04-02 DIAGNOSIS — Z95828 Presence of other vascular implants and grafts: Secondary | ICD-10-CM

## 2015-04-02 LAB — COMPREHENSIVE METABOLIC PANEL
ALBUMIN: 4 g/dL (ref 3.5–5.0)
ALK PHOS: 57 U/L (ref 40–150)
ALT: 28 U/L (ref 0–55)
ANION GAP: 13 meq/L — AB (ref 3–11)
AST: 29 U/L (ref 5–34)
BILIRUBIN TOTAL: 0.57 mg/dL (ref 0.20–1.20)
BUN: 33.3 mg/dL — ABNORMAL HIGH (ref 7.0–26.0)
CO2: 21 mEq/L — ABNORMAL LOW (ref 22–29)
Calcium: 10.1 mg/dL (ref 8.4–10.4)
Chloride: 104 mEq/L (ref 98–109)
Creatinine: 2.1 mg/dL — ABNORMAL HIGH (ref 0.7–1.3)
EGFR: 28 mL/min/{1.73_m2} — AB (ref >90)
GLUCOSE: 178 mg/dL — AB (ref 70–140)
POTASSIUM: 3.9 meq/L (ref 3.5–5.1)
SODIUM: 138 meq/L (ref 136–145)
Total Protein: 6.7 g/dL (ref 6.4–8.3)

## 2015-04-02 LAB — CBC WITH DIFFERENTIAL/PLATELET
BASO%: 0.2 % (ref 0.0–2.0)
BASOS ABS: 0 10*3/uL (ref 0.0–0.1)
EOS ABS: 0.2 10*3/uL (ref 0.0–0.5)
EOS%: 4.3 % (ref 0.0–7.0)
HCT: 31.4 % — ABNORMAL LOW (ref 38.4–49.9)
HEMOGLOBIN: 10.7 g/dL — AB (ref 13.0–17.1)
LYMPH%: 5.6 % — AB (ref 14.0–49.0)
MCH: 29.6 pg (ref 27.2–33.4)
MCHC: 34.1 g/dL (ref 32.0–36.0)
MCV: 86.7 fL (ref 79.3–98.0)
MONO#: 0.4 10*3/uL (ref 0.1–0.9)
MONO%: 6.7 % (ref 0.0–14.0)
NEUT#: 4.6 10*3/uL (ref 1.5–6.5)
NEUT%: 83.2 % — ABNORMAL HIGH (ref 39.0–75.0)
PLATELETS: 146 10*3/uL (ref 140–400)
RBC: 3.62 10*6/uL — ABNORMAL LOW (ref 4.20–5.82)
RDW: 14.5 % (ref 11.0–14.6)
WBC: 5.6 10*3/uL (ref 4.0–10.3)
lymph#: 0.3 10*3/uL — ABNORMAL LOW (ref 0.9–3.3)

## 2015-04-02 MED ORDER — SODIUM CHLORIDE 0.9 % IJ SOLN
10.0000 mL | INTRAMUSCULAR | Status: DC | PRN
  Administered 2015-04-02: 10 mL via INTRAVENOUS
  Filled 2015-04-02: qty 10

## 2015-04-02 MED ORDER — HEPARIN SOD (PORK) LOCK FLUSH 100 UNIT/ML IV SOLN
500.0000 [IU] | Freq: Once | INTRAVENOUS | Status: AC
  Administered 2015-04-02: 500 [IU] via INTRAVENOUS
  Filled 2015-04-02: qty 5

## 2015-04-02 NOTE — Telephone Encounter (Signed)
Appointments made and avs printed for patient °

## 2015-04-02 NOTE — Progress Notes (Signed)
ID: [name] OB: [birth year]  MR#: [id number]  FBP#:[id number]  PCP: Michael Pel, MD GYN:   SU: [name] OTHER MD: [name], [name],  [name], [name], [name]  CHIEF COMPLAINT: Non-Hodgkin's lymphoma, stage IV prostate cancer  CURRENT TREATMENT: Maintenance rituximab, starting Aranesp, considering Denosumab  NON-HODGKIN"S LYMPHOMA HISTORY: From the prior summary:  The patient developed right upper quadrant pain last year and he had an ultrasound April 5th which showed some gallstones without evidence of cholecystitis or ductal dilatation.  However, there were multiple lesions in the liver which could not be assessed further.  Accordingly on July 31, 2003, [name], showed multiple liver masses, more than 25, most over 1 cm, the largest being in the inferior right lobe, measuring 4.2 cm. There was also a small mass in the spleen.  CT of the pelvis was unremarkable.   The patient had a biopsy of the liver August 01, 2003.  The report says only that it was "a lesion in the right lobe of the liver".  Presumably this was the largest lesion present.  The final pathology [phone] and [id number]) showed only cirrhosis.     The patient has been followed by [name], and a repeat CT scan of the abdomen and pelvis was obtained May 18, 2004.  Many of the liver lesions seen previously had actually decreased in size.  However, [name], [name], a second ultrasound-guided biopsy was performed 06/11/04. This was a lesion deep in the left lobe of the liver and therefore, I would think  not the same one previously biopsied which was in the right liver. The pathology this time [id number]) shows a poorly differentiated neuroendocrine carcinoma which was positive for chromogranin A, negative for synaptophysin, thyroid transcription factor, a variety of cytokeratins, [name], alpha-fetoprotein and COX-2."  [name] was subsequently evaluated at [facility] by Dr. Otelia Limes and repeat liver biopsy and review of the earlier biopsy here showed a primary hepatic lymphoma. The patient was treated with R-CHOP as detailed below and achieved a complete response. He received maintenance rituximab until 2008  His subsequent history is as detailed below  INTERVAL HISTORY. AC returns today follow up of his Non-Hodkin's lymphoma, accompanied by both of his daughters. Today is week 5 cycle 10 of rituximab maintenance given every 8 weeks, to be continued indefinitely. He continues to tolerate the right toxin with no side effects that he is aware of. He reports no recent fevers, rash, itching, adenopathy, unexplained fatigue or unexplained weight loss.  REVIEW OF SYSTEMS:  AC is a bit short of breath, sometimes even at rest. He denies chest pain or pressure. He still keeps the [place]. He denies any pain. He thinks his weight is stable and his appetite is good. He has no nausea or vomiting problems. He is sometimes a little bit loose in his bowel movements, when that happens he takes Questran and that takes care of the problem. He has a good urinary stream and no longer has hematuria. He has problems hearing, occasional sinus allergies, occasional ankle swelling,  some easy bruising, and rare headaches. He thinks his sugars are "okay". A detailed review of systems today was otherwise stable  PAST MEDICAL HISTORY: Past Medical History  Diagnosis Date  . Hypertension   . Cancer of liver ([place])   . Kidney stone   . Prostate cancer ([place])   . Skin cancer   . Arthritis   . Anemia   . GERD  (gastroesophageal reflux disease)   . Lymphoma ([place])   . [name]   . Neuropathy in diabetes ([place])     Hx: of  . Diabetes mellitus     INSULIN DEPENDENT  . HX, PERSONAL, MALIGNANCY, PROSTATE 07/28/2006    Annotation: 2001, resected Qualifier: Diagnosis of  By: Michael Sima MD, [name]    . A-fib ([place])   . Near syncope 10/18/2013  . Memory deficit 10/18/2013  . HOH (hard of hearing)   . Sleep apnea     on CPAP   Significant for prostate cancer, the patient undergoing prostatectomy January 2001 under [facility] for a Gleason 7, pathologic T3b (positive seminal vesicle involvement) adenocarcinoma with 0 of 2 lymph nodes involved.  Current PSA is followed by Dr. Alinda [name]. Other medical problems include sleep apnea, minor coronary artery disease, hypertension, hypertriglyceridemia, cholelithiasis, colon polyps, cirrhosis by biopsy, diabetes, squamous cell carcinomas of the skin removed by [name], history of nephrolithiasis, status post right renal surgery and history of left rotator cuff repair under [name].    PAST SURGICAL HISTORY: Past Surgical History  Procedure Laterality Date  . Prostatectomy    . Kidney stone surgery    . Rotator cuff repair    . Ankle surgery    . Cardiac catheterization  09/16/96    Normal LV systolic function,dense ca+ prox. portion of the LAD w/50% narrowing in the distal portion, 30-40% irreg. in the proximal portion & 80% narrowing in the ostial portion of the posterolateral branch.  . Colonoscopy      Hx: of  . Infusion port  04/04/2013    RIGHT SUBCLAVIAN  . Cholecystectomy  04/04/2013  . Cholecystectomy N/A 04/04/2013    Procedure: LAPAROSCOPIC CHOLECYSTECTOMY WITH INTRAOPERATIVE CHOLANGIOGRAM;  Surgeon: Michael Regal, MD;  Location: [facility];  Service: General;  [name];  . Portacath placement N/A 04/04/2013    Procedure: INSERTION PORT-A-CATH;  Surgeon: Michael Regal, MD;  Location: [facility];  Service: General;  [name];   . Cystoscopy with stent placement Bilateral 02/05/2015    Procedure: CYSTOSCOPY RETROGRADE AND BILATERAL  STENT PLACEMENT;  Surgeon: Kathie Rhodes, MD;  Location: [facility];  Service: Urology;  Laterality: Bilateral;  . Transurethral resection of bladder tumor with gyrus (turbt-gyrus) N/A 02/05/2015    Procedure: TRANSURETHRAL RESECTION OF BLADDER TUMOR  ;  Surgeon: Kathie Rhodes, MD;  Location: [facility];  Service: Urology;  [name];    FAMILY HISTORY Family History  Problem Relation Age of Onset  . Heart disease Father   . Heart attack Father   . Cancer Sister   . Heart attack Brother   . Heart attack Brother   . Heart attack Brother   . Heart attack Brother   . Heart attack Brother   . Cancer Brother   . Cancer Brother   The patient's father died at the age of 31 from an MI.  The patient's mother died from "old age" at 63.  The patient is one of nine siblings.  One brother died from cancer of the esophagus, one sister with  lymphoma and a half-brother with lung cancer.  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, [name], a homemaker, died in 06/30/2012: [name] is an Scientist, physiological for [employer]; [name], [name]; and [name] is Environmental consultant.  Everybody lives in [place].  The patient has five grandchildren.  He is a member of [place]. [place].    ADVANCED DIRECTIVES: in place   HEALTH MAINTENANCE: Social History  Substance Use Topics  . Smoking status: Former Smoker    Types: Pipe  . Smokeless tobacco: Former Systems developer    Types: Chew     Comment: [place] YEARS AGO "  . Alcohol Use: No     Colonoscopy:  PSA: Followed by Dr. Alinda [name]  Bone density:  Lipid panel:  Allergies  Allergen Reactions  .  Niacin Other (See Comments)    headaches    Current Outpatient Prescriptions  Medication Sig Dispense Refill  . allopurinol (ZYLOPRIM) 300 MG tablet     . amLODipine (NORVASC) 5 MG tablet Take 2.5 mg by mouth daily.     Marland Kitchen atorvastatin (LIPITOR) 20 MG tablet Take 20 mg by mouth at bedtime.    . BD PEN NEEDLE NANO U/F 32G X 4 MM MISC AS DIRECTED ONCE DAILY SQ 90 DAYS  3  . bicalutamide (CASODEX) 50 MG tablet Take 50 mg by mouth daily.  0  . Calcium-Magnesium-Vitamin D (CALCIUM 1200+D3 PO) Take 2 tablets by mouth daily.    . cefUROXime (CEFTIN) 250 MG tablet     . cephALEXin (KEFLEX) 500 MG capsule Take 1 capsule (500 mg total) by mouth 3 (three) times daily. 30 capsule 0  . cetirizine (ZYRTEC) 10 MG tablet Take 10 mg by mouth daily.    . cholestyramine (QUESTRAN) 4 G packet Take 1 packet (4 g total) by mouth 3 (three) times daily with meals. (Patient not taking: Reported on 02/25/2015) 60 each 12  . Cyanocobalamin (VITAMIN B 12 PO) Take 2,500 mg by mouth daily.     . fenofibrate 160 MG tablet Take 160 mg by mouth at bedtime.    . gabapentin (NEURONTIN) 300 MG capsule Take 300 mg by mouth 3 (three) times daily.     Marland Kitchen HYDROcodone-acetaminophen (NORCO) 5-325 MG tablet Take 1 tablet by mouth every 6 (six) hours as needed for moderate pain. (Patient not taking: Reported on 02/25/2015) 5 tablet 0  . Insulin Glargine (TOUJEO SOLOSTAR ) Inject 60 Units into the skin daily at 6 (six) AM.     . lidocaine-prilocaine (EMLA) cream Apply 1 application topically as needed. (Patient taking differently: Apply 1 application topically as needed (port). ) 30 g 3  . magnesium oxide (MAG-OX) 400 (241.3 MG) MG tablet Take 1 tablet by mouth 2 (two) times daily.  4  . magnesium oxide (MAG-OX) 400 MG tablet Take 400 mg by mouth 2 (two) times daily.    . metFORMIN (GLUCOPHAGE) 1000 MG tablet Take 1,000 mg by mouth 2 (two) times daily with a meal.     . Multiple Vitamins-Minerals (MULTIVITAMIN WITH MINERALS) tablet Take  1 tablet by mouth daily.    . Rivaroxaban (XARELTO) 15 MG TABS tablet Take 15 mg by mouth every evening.     . sertraline (ZOLOFT) 100  MG tablet Take 100 mg by mouth daily.    Nelva Nay SOLOSTAR 300 UNIT/ML SOPN INJECT 60 UNITS SUB-Q ONCE DAILY  5  . vitamin C (ASCORBIC ACID) 500 MG tablet Take 500 mg by mouth daily.     No current facility-administered medications for this visit.    Objective: Older white man in no acute distress  There were no vitals filed for this visit.   There is no weight on file to calculate BMI.      ECOG FS:1 - Symptomatic but completely ambulatory  Sclerae unicteric, pupils round and equal Oropharynx clear and moist-- no thrush or other lesions No cervical or supraclavicular adenopathy Lungs no rales or rhonchi Heart regular rate and rhythm Abd soft, nontender, positive bowel sounds MSK no focal spinal tenderness, no upper extremity lymphedema Neuro: nonfocal, well oriented, appropriate affect     LAB RESULTS:  CMP     Component Value Date/Time   NA 138 02/25/2015 0818   NA 138 02/08/2015 0600   K 4.1 02/25/2015 0818   K 3.9 02/08/2015 0600   CL 110 02/08/2015 0600   CO2 23 02/25/2015 0818   CO2 20* 02/08/2015 0600   GLUCOSE 206* 02/25/2015 0818   GLUCOSE 248* 02/08/2015 0600   BUN 26.7* 02/25/2015 0818   BUN 47* 02/08/2015 0600   CREATININE 2.0* 02/25/2015 0818   CREATININE 2.18* 02/08/2015 0600   CALCIUM 9.8 02/25/2015 0818   CALCIUM 8.9 02/08/2015 0600   CALCIUM 11.0* 08/17/2005 0842   PROT 6.6 02/25/2015 0818   PROT 6.3* 02/05/2015 0448   ALBUMIN 4.0 02/25/2015 0818   ALBUMIN 3.4* 02/05/2015 0448   AST 35* 02/25/2015 0818   AST 32 02/05/2015 0448   ALT 34 02/25/2015 0818   ALT 28 02/05/2015 0448   ALKPHOS 59 02/25/2015 0818   ALKPHOS 52 02/05/2015 0448   BILITOT 0.52 02/25/2015 0818   BILITOT 0.6 02/05/2015 0448   GFRNONAA 27* 02/08/2015 0600   GFRAA 31* 02/08/2015 0600    No results found for: SPEP  Lab Results   Component Value Date   WBC 4.7 02/25/2015   NEUTROABS 3.7 02/25/2015   HGB 9.7* 02/25/2015   HCT 29.4* 02/25/2015   MCV 89.4 02/25/2015   PLT 150 02/25/2015      Chemistry      Component Value Date/Time   NA 138 02/25/2015 0818   NA 138 02/08/2015 0600   K 4.1 02/25/2015 0818   K 3.9 02/08/2015 0600   CL 110 02/08/2015 0600   CO2 23 02/25/2015 0818   CO2 20* 02/08/2015 0600   BUN 26.7* 02/25/2015 0818   BUN 47* 02/08/2015 0600   CREATININE 2.0* 02/25/2015 0818   CREATININE 2.18* 02/08/2015 0600      Component Value Date/Time   CALCIUM 9.8 02/25/2015 0818   CALCIUM 8.9 02/08/2015 0600   CALCIUM 11.0* 08/17/2005 0842   ALKPHOS 59 02/25/2015 0818   ALKPHOS 52 02/05/2015 0448   AST 35* 02/25/2015 0818   AST 32 02/05/2015 0448   ALT 34 02/25/2015 0818   ALT 28 02/05/2015 0448   BILITOT 0.52 02/25/2015 0818   BILITOT 0.6 02/05/2015 0448      Urinalysis    Component Value Date/Time   COLORURINE YELLOW 02/04/2015 1649   APPEARANCEUR TURBID* 02/04/2015 1649   LABSPEC 1.017 02/04/2015 1649   PHURINE 5.5 02/04/2015 1649   GLUCOSEU 250* 02/04/2015 1649   HGBUR LARGE* 02/04/2015 1649   BILIRUBINUR NEGATIVE 02/04/2015 1649   KETONESUR NEGATIVE 02/04/2015  [place] 100* 02/04/2015 1649   UROBILINOGEN 0.2 02/04/2015 1649   NITRITE POSITIVE* 02/04/2015 1649   LEUKOCYTESUR LARGE* 02/04/2015 1649    STUDIES: Nm Bone Scan Whole Body  03/09/2015  CLINICAL DATA:  History of prostate malignancy, PSA of 31.48 in September of 2016; history of fracture of the left arm as well as left rotator cuff repair, history broken left ankle EXAM: NUCLEAR MEDICINE WHOLE BODY BONE SCAN TECHNIQUE: Whole body anterior and posterior images were obtained approximately 3 hours after intravenous injection of radiopharmaceutical. RADIOPHARMACEUTICALS:  27.0. MCi Technetium-73mMDP IV COMPARISON:  Nuclear bone scan of May 10, 2014 FINDINGS: There is adequate uptake of the radiopharmaceutical  by the skeleton. There is adequate soft tissue clearance and renal activity. There is persistent pelvocaliectasis and proximal hydroureter on the left. Uptake within the calvarium and cervical and thoracic spines is normal. There is stable increased uptake in the mid and lower lumbar spine that is likely degenerative in nature. There is stable increased uptake in the sternoclavicular joints bilaterally. There is abnormal uptake in the anterior aspect of the right third, fourth, and fifth ribs consistent with post traumatic change. There is subtle increased uptake in the posterior aspects of the left ninth and tenth ribs which is stable. Mildly increased uptake in the posterior aspect of these seventh and eighth ribs on the right is stable. Uptake within the pelvis and hips is normal. Mild symmetrically increased activity in the knees is present. Intensely increased uptake within the right midfoot persists and less intense increased uptake in the left midfoot and left ankle is seen and stable. IMPRESSION: 1. Persistent increased uptake in bilateral ribs posteriorly is consistent with stable metastatic disease. New foci of increased uptake in the anterior aspects of the right third through fifth ribs likely reflects previous trauma. Plain films of the ribs would be useful. Uptake in the lumbar spine most compatible with degenerative change. Stable increased uptake in the left ankle and in both feet is most compatible with degenerative -post traumatic change. 2. Stable pelvocaliectasis on the left with stable mild left-sided hydroureter. Electronically Signed   By: David  JMartiniqueM.D.   On: 03/09/2015 15:17     ASSESSMENT:  81y.o. patient with a diagnosis of    #1 Primary hepatic non-Hodgkin's lymphoma established through liver biopsy February 2006, treated with Rituxan x9 and then CHOP x6.  All chemotherapy completed in 2007.  Maintenance Rituxan discontinued October 2008.     #2  Biopsy proven  retroperitoneal recurrence documented November 2014. Bone marrow biopsy 04/01/2013 was negative   #3 status post  laparoscopic cholecystectomy and port placement on 04/04/2013.  #4 Cycle #1  RICE chemotherapy  completed on 05/02/2013.   #5 cycle #2 RICE chemotherapy  on 05/17/2013  #6 PET scan performed on 05/23/2013 revealed marked partial response to chemotherapy with a retroperitoneal mass decrease in metabolic activity from SUV of 13.2 to 6.3. Right adrenal mass size and metabolic activity is resolved when compared to the previous PET scan.   #7 [facility] RT[facility] Hosteller[facility] autologous transplant on 05/27/2013 and was quoted a 3% chance of significant heart damage and possibly death from the treatment and a 50% chance of being alive 5 years from now after transplant (versus perhaps 2 years without). After much thought the patient and family have decided firmly they do not wish to proceed to stem cell transplant.  #8 cycle 3  RICE chemotherapy completed on  06/06/2013   #  9 facial cellulitis/rhinophyma with MRSA February 2015 treated with vancomycin IV x14 days completed 07/02/2013, followed by doxycycline for 2 additional weeks  #10 started maintenance Rituxan May 2015 (1 dose every 8 weeks)  #11 A-fib, on rivaroxaban  #12 prostate cancer, stage IV-- per Dr [name]  (a) obstructive uropathy secondary to bladder mass (Gleason 9), s/p  L stenting 02/05/2015  PLAN: AC continues to do well as far as his lymphoma is concerned, with no evidence of disease activity. The plan is to continue the right toxin every 8 weeks as we have been doing. I his next dose will be December 30.  He has had some loss of renal function, doubtless related to his obstructive uropathy at least partly, and although his creatinine continues to improve he remains moderately anemic. He is symptomatic from this. I think it would be helpful for him to try to get his hemoglobin consistently above 10, and we are  going to start him on Aranesp as of next week. We did discuss the possible toxicities, side effects and complications of this agent including the rare cases where patients have had strokes and other severe problems when the hemoglobin is raised to unreasonable levels.  Since he will be receiving an artist shot every week 4 weeks, we could easily adenopathy or mass at the same time if his urologist Dr. Alinda [name] felt that would be helpful.   Otherwise before meals will return to see me late February, with his February right toxin dose. He knows to call for any problems that may develop before that visit.  [name], MD   04/02/2015

## 2015-04-02 NOTE — Patient Instructions (Signed)

## 2015-04-06 ENCOUNTER — Ambulatory Visit: Payer: Medicare Other

## 2015-04-06 ENCOUNTER — Other Ambulatory Visit (HOSPITAL_BASED_OUTPATIENT_CLINIC_OR_DEPARTMENT_OTHER): Payer: Medicare Other

## 2015-04-06 DIAGNOSIS — I10 Essential (primary) hypertension: Secondary | ICD-10-CM | POA: Diagnosis not present

## 2015-04-06 DIAGNOSIS — D630 Anemia in neoplastic disease: Secondary | ICD-10-CM

## 2015-04-06 DIAGNOSIS — E785 Hyperlipidemia, unspecified: Secondary | ICD-10-CM | POA: Diagnosis not present

## 2015-04-06 DIAGNOSIS — N183 Chronic kidney disease, stage 3 unspecified: Secondary | ICD-10-CM

## 2015-04-06 DIAGNOSIS — C8580 Other specified types of non-Hodgkin lymphoma, unspecified site: Secondary | ICD-10-CM

## 2015-04-06 DIAGNOSIS — C8590 Non-Hodgkin lymphoma, unspecified, unspecified site: Secondary | ICD-10-CM | POA: Diagnosis present

## 2015-04-06 DIAGNOSIS — E119 Type 2 diabetes mellitus without complications: Secondary | ICD-10-CM | POA: Diagnosis not present

## 2015-04-06 LAB — CBC WITH DIFFERENTIAL/PLATELET
BASO%: 0.6 % (ref 0.0–2.0)
Basophils Absolute: 0 10*3/uL (ref 0.0–0.1)
EOS ABS: 0.3 10*3/uL (ref 0.0–0.5)
EOS%: 5.1 % (ref 0.0–7.0)
HEMATOCRIT: 32.3 % — AB (ref 38.4–49.9)
HEMOGLOBIN: 10.8 g/dL — AB (ref 13.0–17.1)
LYMPH#: 0.3 10*3/uL — AB (ref 0.9–3.3)
LYMPH%: 5.7 % — AB (ref 14.0–49.0)
MCH: 28.8 pg (ref 27.2–33.4)
MCHC: 33.3 g/dL (ref 32.0–36.0)
MCV: 86.5 fL (ref 79.3–98.0)
MONO#: 0.4 10*3/uL (ref 0.1–0.9)
MONO%: 6.8 % (ref 0.0–14.0)
NEUT%: 81.8 % — ABNORMAL HIGH (ref 39.0–75.0)
NEUTROS ABS: 4.8 10*3/uL (ref 1.5–6.5)
PLATELETS: 164 10*3/uL (ref 140–400)
RBC: 3.73 10*6/uL — ABNORMAL LOW (ref 4.20–5.82)
RDW: 15.4 % — AB (ref 11.0–14.6)
WBC: 5.9 10*3/uL (ref 4.0–10.3)

## 2015-04-06 LAB — COMPREHENSIVE METABOLIC PANEL
ALBUMIN: 4.1 g/dL (ref 3.5–5.0)
ALK PHOS: 65 U/L (ref 40–150)
ALT: 37 U/L (ref 0–55)
ANION GAP: 10 meq/L (ref 3–11)
AST: 34 U/L (ref 5–34)
BILIRUBIN TOTAL: 0.36 mg/dL (ref 0.20–1.20)
BUN: 33.9 mg/dL — ABNORMAL HIGH (ref 7.0–26.0)
CALCIUM: 9.8 mg/dL (ref 8.4–10.4)
CO2: 22 meq/L (ref 22–29)
CREATININE: 2 mg/dL — AB (ref 0.7–1.3)
Chloride: 104 mEq/L (ref 98–109)
EGFR: 31 mL/min/{1.73_m2} — AB (ref >90)
Glucose: 158 mg/dl — ABNORMAL HIGH (ref 70–140)
Potassium: 4.1 mEq/L (ref 3.5–5.1)
Sodium: 137 mEq/L (ref 136–145)
TOTAL PROTEIN: 6.9 g/dL (ref 6.4–8.3)

## 2015-04-06 LAB — TECHNOLOGIST REVIEW

## 2015-04-06 MED ORDER — DARBEPOETIN ALFA 300 MCG/0.6ML IJ SOSY: 300.0000 ug | PREFILLED_SYRINGE | Freq: Once | INTRAMUSCULAR | Status: DC

## 2015-04-08 DIAGNOSIS — E119 Type 2 diabetes mellitus without complications: Secondary | ICD-10-CM | POA: Diagnosis not present

## 2015-04-24 ENCOUNTER — Other Ambulatory Visit: Payer: Self-pay | Admitting: *Deleted

## 2015-04-24 ENCOUNTER — Encounter: Payer: Self-pay | Admitting: Nurse Practitioner

## 2015-04-24 ENCOUNTER — Ambulatory Visit (HOSPITAL_BASED_OUTPATIENT_CLINIC_OR_DEPARTMENT_OTHER): Payer: Medicare Other | Admitting: Nurse Practitioner

## 2015-04-24 ENCOUNTER — Ambulatory Visit (HOSPITAL_BASED_OUTPATIENT_CLINIC_OR_DEPARTMENT_OTHER): Payer: Medicare Other

## 2015-04-24 ENCOUNTER — Other Ambulatory Visit (HOSPITAL_BASED_OUTPATIENT_CLINIC_OR_DEPARTMENT_OTHER): Payer: Medicare Other

## 2015-04-24 ENCOUNTER — Ambulatory Visit: Payer: Medicare Other

## 2015-04-24 VITALS — BP 147/68 | HR 72 | Temp 97.1°F | Resp 18

## 2015-04-24 VITALS — BP 125/52 | HR 70 | Temp 98.1°F | Resp 18

## 2015-04-24 VITALS — BP 137/57 | HR 73 | Temp 98.2°F | Resp 18 | Ht 68.0 in | Wt 187.3 lb

## 2015-04-24 DIAGNOSIS — C8590 Non-Hodgkin lymphoma, unspecified, unspecified site: Secondary | ICD-10-CM

## 2015-04-24 DIAGNOSIS — R300 Dysuria: Secondary | ICD-10-CM

## 2015-04-24 DIAGNOSIS — R319 Hematuria, unspecified: Secondary | ICD-10-CM

## 2015-04-24 DIAGNOSIS — I4891 Unspecified atrial fibrillation: Secondary | ICD-10-CM | POA: Diagnosis not present

## 2015-04-24 DIAGNOSIS — D631 Anemia in chronic kidney disease: Secondary | ICD-10-CM

## 2015-04-24 DIAGNOSIS — C8593 Non-Hodgkin lymphoma, unspecified, intra-abdominal lymph nodes: Secondary | ICD-10-CM

## 2015-04-24 DIAGNOSIS — C8580 Other specified types of non-Hodgkin lymphoma, unspecified site: Secondary | ICD-10-CM

## 2015-04-24 DIAGNOSIS — N183 Chronic kidney disease, stage 3 (moderate): Secondary | ICD-10-CM

## 2015-04-24 DIAGNOSIS — C61 Malignant neoplasm of prostate: Secondary | ICD-10-CM

## 2015-04-24 DIAGNOSIS — Z5112 Encounter for antineoplastic immunotherapy: Secondary | ICD-10-CM

## 2015-04-24 LAB — CBC WITH DIFFERENTIAL/PLATELET
BASO%: 0.4 % (ref 0.0–2.0)
BASOS ABS: 0 10*3/uL (ref 0.0–0.1)
EOS ABS: 0.2 10*3/uL (ref 0.0–0.5)
EOS%: 3.3 % (ref 0.0–7.0)
HEMATOCRIT: 29.6 % — AB (ref 38.4–49.9)
HEMOGLOBIN: 9.9 g/dL — AB (ref 13.0–17.1)
LYMPH#: 0.2 10*3/uL — AB (ref 0.9–3.3)
LYMPH%: 3.6 % — ABNORMAL LOW (ref 14.0–49.0)
MCH: 28.9 pg (ref 27.2–33.4)
MCHC: 33.3 g/dL (ref 32.0–36.0)
MCV: 86.8 fL (ref 79.3–98.0)
MONO#: 0.3 10*3/uL (ref 0.1–0.9)
MONO%: 7.3 % (ref 0.0–14.0)
NEUT#: 4.1 10*3/uL (ref 1.5–6.5)
NEUT%: 85.4 % — ABNORMAL HIGH (ref 39.0–75.0)
PLATELETS: 118 10*3/uL — AB (ref 140–400)
RBC: 3.41 10*6/uL — ABNORMAL LOW (ref 4.20–5.82)
RDW: 15.2 % — AB (ref 11.0–14.6)
WBC: 4.8 10*3/uL (ref 4.0–10.3)

## 2015-04-24 LAB — URINALYSIS, MICROSCOPIC - CHCC
Bilirubin (Urine): NEGATIVE
GLUCOSE UR CHCC: 250 mg/dL
KETONES: NEGATIVE mg/dL
Leukocyte Esterase: NEGATIVE
Nitrite: NEGATIVE
PH: 6 (ref 4.6–8.0)
PROTEIN: 30 mg/dL
Specific Gravity, Urine: 1.02 (ref 1.003–1.035)
Urobilinogen, UR: 0.2 mg/dL (ref 0.2–1)

## 2015-04-24 LAB — COMPREHENSIVE METABOLIC PANEL
ALBUMIN: 3.8 g/dL (ref 3.5–5.0)
ALK PHOS: 62 U/L (ref 40–150)
ALT: 41 U/L (ref 0–55)
AST: 43 U/L — AB (ref 5–34)
Anion Gap: 9 mEq/L (ref 3–11)
BUN: 20.9 mg/dL (ref 7.0–26.0)
CALCIUM: 8.9 mg/dL (ref 8.4–10.4)
CO2: 23 mEq/L (ref 22–29)
Chloride: 108 mEq/L (ref 98–109)
Creatinine: 1.8 mg/dL — ABNORMAL HIGH (ref 0.7–1.3)
EGFR: 36 mL/min/{1.73_m2} — AB (ref >90)
Glucose: 214 mg/dl — ABNORMAL HIGH (ref 70–140)
POTASSIUM: 3.7 meq/L (ref 3.5–5.1)
Sodium: 140 mEq/L (ref 136–145)
Total Bilirubin: 0.51 mg/dL (ref 0.20–1.20)
Total Protein: 6.3 g/dL — ABNORMAL LOW (ref 6.4–8.3)

## 2015-04-24 MED ORDER — SODIUM CHLORIDE 0.9 % IJ SOLN
10.0000 mL | Freq: Once | INTRAMUSCULAR | Status: AC
  Administered 2015-04-24: 10 mL
  Filled 2015-04-24: qty 10

## 2015-04-24 MED ORDER — SODIUM CHLORIDE 0.9 % IJ SOLN
10.0000 mL | INTRAMUSCULAR | Status: DC | PRN
  Administered 2015-04-24: 10 mL
  Filled 2015-04-24: qty 10

## 2015-04-24 MED ORDER — ACETAMINOPHEN 325 MG PO TABS
ORAL_TABLET | ORAL | Status: AC
Start: 2015-04-24 — End: 2015-04-24
  Filled 2015-04-24: qty 2

## 2015-04-24 MED ORDER — SODIUM CHLORIDE 0.9 % IV SOLN
Freq: Once | INTRAVENOUS | Status: AC
  Administered 2015-04-24: 10:00:00 via INTRAVENOUS

## 2015-04-24 MED ORDER — HEPARIN SOD (PORK) LOCK FLUSH 100 UNIT/ML IV SOLN
500.0000 [IU] | Freq: Once | INTRAVENOUS | Status: AC | PRN
  Administered 2015-04-24: 500 [IU]
  Filled 2015-04-24: qty 5

## 2015-04-24 MED ORDER — SODIUM CHLORIDE 0.9 % IV SOLN
375.0000 mg/m2 | Freq: Once | INTRAVENOUS | Status: AC
  Administered 2015-04-24: 800 mg via INTRAVENOUS
  Filled 2015-04-24: qty 80

## 2015-04-24 MED ORDER — DIPHENHYDRAMINE HCL 25 MG PO CAPS
25.0000 mg | ORAL_CAPSULE | Freq: Once | ORAL | Status: AC
  Administered 2015-04-24: 25 mg via ORAL

## 2015-04-24 MED ORDER — ACETAMINOPHEN 325 MG PO TABS
650.0000 mg | ORAL_TABLET | Freq: Once | ORAL | Status: AC
  Administered 2015-04-24: 650 mg via ORAL

## 2015-04-24 MED ORDER — DIPHENHYDRAMINE HCL 25 MG PO CAPS
ORAL_CAPSULE | ORAL | Status: AC
  Filled 2015-04-24: qty 2

## 2015-04-24 NOTE — Progress Notes (Signed)
ID: [name] OB: [birth year]  MR#: [id number]  WUJ#:[id number]  PCP: Horatio Pel, MD GYN:   SU: [name] OTHER MD: [name], [name],  [name], [name], [name]  CHIEF COMPLAINT: Non-Hodgkin's lymphoma, stage IV prostate cancer  CURRENT TREATMENT: Maintenance rituximab, starting Aranesp, considering Denosumab  NON-HODGKIN"S LYMPHOMA HISTORY: From the prior summary:  The patient developed right upper quadrant pain last year and he had an ultrasound April 5th which showed some gallstones without evidence of cholecystitis or ductal dilatation.  However, there were multiple lesions in the liver which could not be assessed further.  Accordingly on July 31, 2003, [name], showed multiple liver masses, more than 25, most over 1 cm, the largest being in the inferior right lobe, measuring 4.2 cm. There was also a small mass in the spleen.  CT of the pelvis was unremarkable.   The patient had a biopsy of the liver August 01, 2003.  The report says only that it was "a lesion in the right lobe of the liver".  Presumably this was the largest lesion present.  The final pathology [id number] and [phone]) showed only cirrhosis.     The patient has been followed by [name], and a repeat CT scan of the abdomen and pelvis was obtained May 18, 2004.  Many of the liver lesions seen previously had actually decreased in size.  However, [name], [name], a second ultrasound-guided biopsy was performed 06/11/04. This was a lesion deep in the left lobe of the liver and therefore, I would think  not the same one previously biopsied which was in the right liver. The pathology this time [id number]) shows a poorly differentiated neuroendocrine carcinoma which was positive for chromogranin A, negative for synaptophysin, thyroid transcription factor, a variety of cytokeratins, [name], alpha-fetoprotein and COX-2."  Mr. Sanor was subsequently evaluated at [facility] by Dr. Otelia Limes and repeat liver biopsy and review of the earlier biopsy here showed a primary hepatic lymphoma. The patient was treated with R-CHOP as detailed below and achieved a complete response. He received maintenance rituximab until 2008  His subsequent history is as detailed below  INTERVAL HISTORY. AC returns today follow up of his Non-Hodkin's lymphoma, accompanied by one of his daughters. Today is day 1, cycle 11 of rituximab maintenance given every 8 weeks, to be continued indefinitely. He tolerates this well with so side effects that he is aware of.   REVIEW OF SYSTEMS:  AC denies fevers, chills, night sweats, lack of appetite, unexplained weight loss, or increased fatigue. He is short of breath with exertion and sometimes at rest. He denies chest pain, cough, or palpitations. He continues to work in his garden and rests when he needs to. He has occasional ankle swelling. He has no nausea or vomiting. His bowels are back to normal and he does not need questran powder any more. He has noticed some burning with urination and occasionally light blood in his urine, but does not have frequency or urgency, and has not noticed a color or olfactory change. He has sinus allergies  and occasional headaches. A detailed review of systems is otherwise stable.  PAST MEDICAL HISTORY: Past Medical History  Diagnosis Date  . Hypertension   . Cancer of liver ([place])   . Kidney stone   . Prostate cancer ([place])   . Skin cancer   . Arthritis   . Anemia   . GERD (gastroesophageal reflux disease)   . Lymphoma ([place])   . [name]    . Neuropathy in diabetes ([place])     Hx: of  . Diabetes mellitus     INSULIN DEPENDENT  . HX, PERSONAL, MALIGNANCY, PROSTATE 07/28/2006    Annotation: 2001, resected Qualifier: Diagnosis of  By: [name], [name]    . A-fib ([place])   . Near syncope 10/18/2013  . Memory deficit 10/18/2013  . HOH (hard of hearing)   . Sleep apnea     on CPAP   Significant for prostate cancer, the patient undergoing prostatectomy January 2001 under [facility] for a Gleason 7, pathologic T3b (positive seminal vesicle involvement) adenocarcinoma with 0 of 2 lymph nodes involved.  Current PSA is followed by Dr. [name]. Other medical problems include sleep apnea, minor coronary artery disease, hypertension, hypertriglyceridemia, cholelithiasis, colon polyps, cirrhosis by biopsy, diabetes, [name], history of nephrolithiasis, status post right renal surgery and history of left rotator cuff repair under [name].    PAST SURGICAL HISTORY: Past Surgical History  Procedure Laterality Date  . Prostatectomy    . Kidney stone surgery    . Rotator cuff repair    . Ankle surgery    . Cardiac catheterization  09/16/96    Normal LV systolic function,dense ca+ prox. portion of the LAD w/50% narrowing in the distal portion, 30-40% irreg. in the proximal portion & 80% narrowing in the ostial portion of the posterolateral branch.  . Colonoscopy      Hx: of  . Infusion port  04/04/2013    RIGHT SUBCLAVIAN  . Cholecystectomy  04/04/2013  . Cholecystectomy N/A 04/04/2013    Procedure: LAPAROSCOPIC CHOLECYSTECTOMY WITH INTRAOPERATIVE CHOLANGIOGRAM;  [name], MD;  Location: [place];  Service: General;  [name];  . Portacath placement N/A 04/04/2013    Procedure: INSERTION PORT-A-CATH;  [name], MD;  Location: [facility];  Service: General;  [name];  . Cystoscopy with stent placement Bilateral 02/05/2015    Procedure:  CYSTOSCOPY RETROGRADE AND BILATERAL  STENT PLACEMENT;  Surgeon: Kathie Rhodes, MD;  Location: [facility];  Service: Urology;  Laterality: Bilateral;  . Transurethral resection of bladder tumor with gyrus (turbt-gyrus) N/A 02/05/2015    Procedure: TRANSURETHRAL RESECTION OF BLADDER TUMOR  ;  Surgeon: Kathie Rhodes, MD;  Location: [facility];  Service: Urology;  [name];    FAMILY HISTORY Family History  Problem Relation Age of Onset  . Heart disease Father   . Heart attack Father   . Cancer Sister   . Heart attack Brother   . Heart attack Brother   . Heart attack Brother   . Heart attack Brother   . Heart attack Brother   . Cancer Brother   . Cancer Brother   The patient's father died at the age of 15 from an MI.  The patient's mother died from "old age" at 57.  The patient is one of nine siblings.  One brother died from cancer of the esophagus, 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, [name], a homemaker, died in 2012/07/21: [name] is an Scientist, physiological for [employer]; [name], [name]; and [name] is Environmental consultant.  Everybody lives in [place].  The patient has five grandchildren.  He is a member of [place]. [place].    ADVANCED DIRECTIVES: in place   HEALTH MAINTENANCE: Social History  Substance Use Topics  . Smoking status: Former Smoker    Types: Pipe  . Smokeless tobacco: Former Systems developer    Types: Chew     Comment: [place] YEARS AGO "  . Alcohol Use: No     Colonoscopy:  PSA: Followed by Dr. [name]  Bone density:  Lipid panel:  Allergies  Allergen Reactions  . Niacin Other (See Comments)    headaches    Current Outpatient  Prescriptions  Medication Sig Dispense Refill  . allopurinol (ZYLOPRIM) 300 MG tablet     . amLODipine (NORVASC) 5 MG tablet Take 2.5 mg by mouth daily.     Marland Kitchen atorvastatin (LIPITOR) 20 MG tablet Take 20 mg by mouth at bedtime.    . BD PEN NEEDLE NANO U/F 32G X 4 MM MISC AS DIRECTED ONCE DAILY SQ 90 DAYS  3  . Calcium-Magnesium-Vitamin D (CALCIUM 1200+D3 PO) Take 2 tablets by mouth daily.    . cetirizine (ZYRTEC) 10 MG tablet Take 10 mg by mouth daily.    . Cyanocobalamin (VITAMIN B 12 PO) Take 2,500 mg by mouth daily.     . fenofibrate 160 MG tablet Take 160 mg by mouth at bedtime.    . gabapentin (NEURONTIN) 300 MG capsule Take 300 mg by mouth 3 (three) times daily.     Marland Kitchen lidocaine-prilocaine (EMLA) cream Apply 1 application topically as needed. (Patient taking differently: Apply 1 application topically as needed (port). ) 30 g 3  . magnesium oxide (MAG-OX) 400 (241.3 MG) MG tablet Take 1 tablet by mouth 2 (two) times daily.  4  . metFORMIN (GLUCOPHAGE) 1000 MG tablet Take 1,000 mg by mouth 2 (two) times daily with a meal.     . Multiple Vitamins-Minerals (MULTIVITAMIN WITH MINERALS) tablet Take 1 tablet by mouth daily.    . Rivaroxaban (XARELTO) 15 MG TABS tablet Take 15 mg by mouth every evening.     . sertraline (ZOLOFT) 100 MG tablet Take 100 mg by mouth daily.    Nelva Nay SOLOSTAR 300 UNIT/ML SOPN INJECT 60 UNITS SUB-Q ONCE DAILY  5  . vitamin C (ASCORBIC ACID) 500 MG tablet Take 500 mg by mouth daily.    . cholestyramine (QUESTRAN) 4 G packet Take 1 packet (4 g total) by mouth 3 (three) times daily with meals. (Patient not taking: Reported on 04/24/2015) 60 each 12   No current facility-administered medications for this visit.    Objective: Older white man in no acute distress  Filed Vitals:   04/24/15 0900  BP: 137/57  Pulse: 73  Temp: 98.2 F (36.8 C)  Resp: 18     Body mass index is 28.49 kg/(m^2).      ECOG FS:1 - Symptomatic but completely ambulatory  Skin: warm, dry   HEENT: sclerae anicteric, conjunctivae pink, oropharynx clear. No thrush or mucositis.  Lymph Nodes: No cervical or  supraclavicular lymphadenopathy  Lungs: clear to auscultation bilaterally, no rales, wheezes, or rhonci  Heart: regular rate and rhythm  Abdomen: round, soft, non tender, positive bowel sounds  Musculoskeletal: No focal spinal tenderness, no peripheral edema  Neuro: non focal, well oriented, positive affect    LAB RESULTS:  CMP     Component Value Date/Time   NA 140 04/24/2015 0820   NA 138 02/08/2015 0600   K 3.7 04/24/2015 0820   K 3.9 02/08/2015 0600   CL 110 02/08/2015 0600   CO2 23 04/24/2015 0820   CO2 20* 02/08/2015 0600   GLUCOSE 214* 04/24/2015 0820   GLUCOSE 248* 02/08/2015 0600   BUN 20.9 04/24/2015 0820   BUN 47* 02/08/2015 0600   CREATININE 1.8* 04/24/2015 0820   CREATININE 2.18* 02/08/2015 0600   CALCIUM 8.9 04/24/2015 0820   CALCIUM 8.9 02/08/2015 0600   CALCIUM 11.0* 08/17/2005 0842   PROT 6.3* 04/24/2015 0820   PROT 6.3* 02/05/2015 0448   ALBUMIN 3.8 04/24/2015 0820   ALBUMIN 3.4* 02/05/2015 0448   AST 43* 04/24/2015 0820   AST 32 02/05/2015 0448   ALT 41 04/24/2015 0820   ALT 28 02/05/2015 0448   ALKPHOS 62 04/24/2015 0820   ALKPHOS 52 02/05/2015 0448   BILITOT 0.51 04/24/2015 0820   BILITOT 0.6 02/05/2015 0448   GFRNONAA 27* 02/08/2015 0600   GFRAA 31* 02/08/2015 0600    No results found for: SPEP  Lab Results  Component Value Date   WBC 4.8 04/24/2015   NEUTROABS 4.1 04/24/2015   HGB 9.9* 04/24/2015   HCT 29.6* 04/24/2015   MCV 86.8 04/24/2015   PLT 118* 04/24/2015      Chemistry      Component Value Date/Time   NA 140 04/24/2015 0820   NA 138 02/08/2015 0600   K 3.7 04/24/2015 0820   K 3.9 02/08/2015 0600   CL 110 02/08/2015 0600   CO2 23 04/24/2015 0820   CO2 20* 02/08/2015 0600   BUN 20.9 04/24/2015 0820   BUN 47* 02/08/2015 0600   CREATININE 1.8* 04/24/2015 0820   CREATININE 2.18* 02/08/2015 0600       Component Value Date/Time   CALCIUM 8.9 04/24/2015 0820   CALCIUM 8.9 02/08/2015 0600   CALCIUM 11.0* 08/17/2005 0842   ALKPHOS 62 04/24/2015 0820   ALKPHOS 52 02/05/2015 0448   AST 43* 04/24/2015 0820   AST 32 02/05/2015 0448   ALT 41 04/24/2015 0820   ALT 28 02/05/2015 0448   BILITOT 0.51 04/24/2015 0820   BILITOT 0.6 02/05/2015 0448      Urinalysis    Component Value Date/Time   COLORURINE YELLOW 02/04/2015 1649   APPEARANCEUR TURBID* 02/04/2015 1649   LABSPEC 1.017 02/04/2015 1649   PHURINE 5.5 02/04/2015 1649   GLUCOSEU 250* 02/04/2015 1649   HGBUR LARGE* 02/04/2015 1649   BILIRUBINUR NEGATIVE 02/04/2015 1649   KETONESUR NEGATIVE 02/04/2015 1649   PROTEINUR 100* 02/04/2015 1649   UROBILINOGEN 0.2 02/04/2015 1649   NITRITE POSITIVE* 02/04/2015 1649   LEUKOCYTESUR LARGE* 02/04/2015 1649    STUDIES: No results found.   ASSESSMENT:  81 y.o. patient with a diagnosis of    #1 Primary hepatic non-Hodgkin's lymphoma established through liver biopsy February 2006, treated with Rituxan x9 and then CHOP x6.  All chemotherapy completed in 2007.  Maintenance Rituxan discontinued October 2008.     #2  Biopsy proven retroperitoneal recurrence documented November 2014. Bone marrow biopsy 04/01/2013 was negative   #3 status post  laparoscopic cholecystectomy and port placement on 04/04/2013.  #4 Cycle #1  RICE chemotherapy  completed on 05/02/2013.   #5 cycle #2 RICE chemotherapy  on 05/17/2013  #6 PET scan performed on 05/23/2013 revealed marked partial response to chemotherapy with a retroperitoneal mass decrease in metabolic activity from SUV of 13.2 to 6.3. Right adrenal mass size and metabolic activity is resolved when compared to the previous PET scan.   #7 Evaluated at [facility] by Dr. Tomasa Hosteller for autologous transplant on 05/27/2013 and was quoted a 3% chance of significant heart damage and possibly death from the treatment and a 50% chance of being alive 5 years from now  after transplant (versus perhaps 2 years without). After much thought the patient and family have decided firmly they do not wish to proceed to stem cell transplant.  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 treated with vancomycin IV x14 days completed 07/02/2013, followed by doxycycline for 2 additional weeks  #10 started maintenance Rituxan May 2015 (1 dose every 8 weeks)  #11 A-fib, on rivaroxaban  #12 prostate cancer, stage IV-- per Dr [name]  (a) obstructive uropathy secondary to bladder mass (Gleason 9), s/p  L stenting 02/05/2015  PLAN: AC is stable today, with no evidence of lymphoma recurrence at this time. He will continue rituxan every 8 weeks as long as it buys him good control. The labs were reviewed in detail and he will receive his next aranesp dose today, for a hgb of 9.9.   I am waiting for a urinalysis to return. The burning and hematuria could be consistent with a UTI, but he does have a history of obstructive uropathy secondary to a bladder mass from prostate cancer. The urinary stents could be causing the issue. If this is the case, he will need to follow up with Dr. Sherrine Maples will return in February for his next dose of rituxan and follow up visit with Dr. Jana Hakim. He understands and agrees with this plan. He knows the goal of treatment in his case is control. He has been encouraged to call with any issues that might arise before his next visit here.   Laurie Panda, NP   04/24/2015   ADDENDUM: Urinalysis returned negative for nitrites and leukocytes, but there was a large amount of blood present. Patient will follow up with Dr. [name] for evaluation of his stents.

## 2015-04-24 NOTE — Patient Instructions (Signed)
[facility] Discharge Instructions for Patients Receiving Chemotherapy  Today you received the following chemotherapy agents Rituxan  To help prevent nausea and vomiting after your treatment, we encourage you to take your nausea medication    If you develop nausea and vomiting that is not controlled by your nausea medication, call the clinic.   BELOW ARE SYMPTOMS THAT SHOULD BE REPORTED IMMEDIATELY:  *FEVER GREATER THAN 100.5 F  *CHILLS WITH OR WITHOUT FEVER  NAUSEA AND VOMITING THAT IS NOT CONTROLLED WITH YOUR NAUSEA MEDICATION  *UNUSUAL [name]  *UNUSUAL BRUISING OR BLEEDING  TENDERNESS IN MOUTH AND THROAT WITH OR WITHOUT PRESENCE OF ULCERS  *URINARY PROBLEMS  *BOWEL PROBLEMS  UNUSUAL RASH Items with * indicate a potential emergency and should be followed up as soon as possible.  Feel free to call the clinic you have any questions or concerns. The clinic phone number is [phone].  Please show the CHEMO ALERT CARD at check-in to the Emergency Department and triage nurse.   

## 2015-04-25 LAB — URINE CULTURE

## 2015-05-01 DIAGNOSIS — Z0000 Encounter for general adult medical examination without abnormal findings: Secondary | ICD-10-CM | POA: Diagnosis not present

## 2015-05-01 DIAGNOSIS — C61 Malignant neoplasm of prostate: Secondary | ICD-10-CM | POA: Diagnosis not present

## 2015-05-01 DIAGNOSIS — N135 Crossing vessel and stricture of ureter without hydronephrosis: Secondary | ICD-10-CM | POA: Diagnosis not present

## 2015-05-04 ENCOUNTER — Telehealth: Payer: Self-pay | Admitting: Oncology

## 2015-05-04 ENCOUNTER — Ambulatory Visit: Payer: Medicare Other

## 2015-05-04 ENCOUNTER — Other Ambulatory Visit: Payer: Medicare Other

## 2015-05-04 NOTE — Telephone Encounter (Signed)
Daughter called in to reschedule lab and inj due to weather

## 2015-05-05 ENCOUNTER — Ambulatory Visit: Payer: Medicare Other

## 2015-05-05 ENCOUNTER — Other Ambulatory Visit (HOSPITAL_BASED_OUTPATIENT_CLINIC_OR_DEPARTMENT_OTHER): Payer: Medicare Other

## 2015-05-05 ENCOUNTER — Telehealth: Payer: Self-pay | Admitting: Oncology

## 2015-05-05 ENCOUNTER — Other Ambulatory Visit: Payer: Medicare Other

## 2015-05-05 DIAGNOSIS — N183 Chronic kidney disease, stage 3 unspecified: Secondary | ICD-10-CM

## 2015-05-05 DIAGNOSIS — C8580 Other specified types of non-Hodgkin lymphoma, unspecified site: Secondary | ICD-10-CM

## 2015-05-05 DIAGNOSIS — C8590 Non-Hodgkin lymphoma, unspecified, unspecified site: Secondary | ICD-10-CM

## 2015-05-05 DIAGNOSIS — C8593 Non-Hodgkin lymphoma, unspecified, intra-abdominal lymph nodes: Secondary | ICD-10-CM | POA: Diagnosis not present

## 2015-05-05 DIAGNOSIS — D630 Anemia in neoplastic disease: Secondary | ICD-10-CM

## 2015-05-05 LAB — CBC WITH DIFFERENTIAL/PLATELET
BASO%: 0.5 % (ref 0.0–2.0)
BASOS ABS: 0 10*3/uL (ref 0.0–0.1)
EOS ABS: 0.3 10*3/uL (ref 0.0–0.5)
EOS%: 4 % (ref 0.0–7.0)
HCT: 35.1 % — ABNORMAL LOW (ref 38.4–49.9)
HGB: 11.5 g/dL — ABNORMAL LOW (ref 13.0–17.1)
LYMPH%: 4.7 % — AB (ref 14.0–49.0)
MCH: 28.7 pg (ref 27.2–33.4)
MCHC: 32.8 g/dL (ref 32.0–36.0)
MCV: 87.5 fL (ref 79.3–98.0)
MONO#: 0.4 10*3/uL (ref 0.1–0.9)
MONO%: 6.2 % (ref 0.0–14.0)
NEUT#: 5.7 10*3/uL (ref 1.5–6.5)
NEUT%: 84.6 % — ABNORMAL HIGH (ref 39.0–75.0)
PLATELETS: 178 10*3/uL (ref 140–400)
RBC: 4.01 10*6/uL — AB (ref 4.20–5.82)
RDW: 15.2 % — ABNORMAL HIGH (ref 11.0–14.6)
WBC: 6.8 10*3/uL (ref 4.0–10.3)
lymph#: 0.3 10*3/uL — ABNORMAL LOW (ref 0.9–3.3)

## 2015-05-05 LAB — COMPREHENSIVE METABOLIC PANEL
ALBUMIN: 4.3 g/dL (ref 3.5–5.0)
ALK PHOS: 60 U/L (ref 40–150)
ALT: 33 U/L (ref 0–55)
ANION GAP: 10 meq/L (ref 3–11)
AST: 33 U/L (ref 5–34)
BILIRUBIN TOTAL: 0.35 mg/dL (ref 0.20–1.20)
BUN: 28 mg/dL — ABNORMAL HIGH (ref 7.0–26.0)
CALCIUM: 9.9 mg/dL (ref 8.4–10.4)
CO2: 20 mEq/L — ABNORMAL LOW (ref 22–29)
CREATININE: 1.9 mg/dL — AB (ref 0.7–1.3)
Chloride: 104 mEq/L (ref 98–109)
EGFR: 33 mL/min/{1.73_m2} — AB (ref >90)
Glucose: 216 mg/dl — ABNORMAL HIGH (ref 70–140)
Potassium: 4.4 mEq/L (ref 3.5–5.1)
Sodium: 135 mEq/L — ABNORMAL LOW (ref 136–145)
TOTAL PROTEIN: 7.1 g/dL (ref 6.4–8.3)

## 2015-05-05 MED ORDER — DARBEPOETIN ALFA 300 MCG/0.6ML IJ SOSY: 300.0000 ug | PREFILLED_SYRINGE | Freq: Once | INTRAMUSCULAR | Status: DC

## 2015-05-05 NOTE — Telephone Encounter (Signed)
Daughter called in to reschedule todays appts to earlier due to transport

## 2015-05-13 ENCOUNTER — Telehealth: Payer: Self-pay | Admitting: Oncology

## 2015-05-13 ENCOUNTER — Other Ambulatory Visit: Payer: Self-pay | Admitting: Urology

## 2015-05-13 NOTE — Telephone Encounter (Signed)
Returned call about message to r/s appointmentt 2/6.

## 2015-05-20 ENCOUNTER — Telehealth: Payer: Self-pay | Admitting: Oncology

## 2015-05-20 NOTE — Telephone Encounter (Signed)
Pt's daughter called to r/s due to pt having surgery same day, she confirmed updated schedule for labs/inj... KJ

## 2015-05-26 NOTE — Patient Instructions (Addendum)
[name]  05/26/2015   Your procedure is scheduled on: Monday 06/01/2015  Report to [facility] Main  Entrance take [place]  elevators to 3rd floor to  [place] at  [place]  AM.  Call this number if you have problems the morning of surgery [phone]   Remember: ONLY 1 PERSON MAY GO WITH YOU TO SHORT STAY TO GET  READY MORNING OF [name]   Do not eat food or drink liquids :After Midnight.     Take these medicines the morning of surgery with A SIP OF WATER: Amlodipine,Neurontin,zoloft               DO NOT TAKE ANY DIABETIC MEDICATIONS DAY OF YOUR SURGERY!                               You may not have any metal on your body including hair pins and              piercings  Do not wear jewelry, make-up, lotions, powders or perfumes, deodorant             Do not wear nail polish.  Do not shave  48 hours prior to surgery.              Men may shave face and neck.   Do not bring valuables to the hospital. [name]  Contacts, dentures or bridgework may not be worn into surgery.  Leave suitcase in the car. After surgery it may be brought to your room.     Patients discharged the day of surgery will not be allowed to drive home.  Name and phone number of your driver:  Special Instructions: N/A              Please read over the following fact sheets you were given: _____________________________________________________________________             [name] - Preparing for Surgery Before surgery, you can play an important role.  Because skin is not sterile, your skin needs to be as free of germs as possible.  You can reduce the number of germs on your skin by washing with CHG (chlorahexidine gluconate) soap before surgery.  CHG is an antiseptic cleaner which kills germs and bonds with the skin to continue killing germs even after washing. Please DO NOT use if you have an allergy to CHG or  antibacterial soaps.  If your skin becomes reddened/irritated stop using the CHG and inform your nurse when you arrive at Short Stay. Do not shave (including legs and underarms) for at least 48 hours prior to the first CHG shower.  You may shave your face/neck. Please follow these instructions carefully:  1.  Shower with CHG Soap the night before surgery and the  morning of Surgery.  2.  If you choose to wash your hair, wash your hair first as usual with your  normal  shampoo.  3.  After you shampoo, rinse your hair and body thoroughly to remove the  shampoo.                           4.  Use CHG as you would  any other liquid soap.  You can apply chg directly  to the skin and wash                       Gently with a scrungie or clean washcloth.  5.  Apply the CHG Soap to your body ONLY FROM THE NECK DOWN.   Do not use on face/ open                           Wound or open sores. Avoid contact with eyes, ears mouth and genitals (private parts).                       Wash face,  Genitals (private parts) with your normal soap.             6.  Wash thoroughly, paying special attention to the area where your surgery  will be performed.  7.  Thoroughly rinse your body with warm water from the neck down.  8.  DO NOT shower/wash with your normal soap after using and rinsing off  the CHG Soap.                9.  Pat yourself dry with a clean towel.            10.  Wear clean pajamas.            11.  Place clean sheets on your bed the night of your first shower and do not  sleep with pets. Day of Surgery : Do not apply any lotions/deodorants the morning of surgery.  Please wear clean clothes to the hospital/surgery center.  FAILURE TO FOLLOW THESE INSTRUCTIONS MAY RESULT IN THE CANCELLATION OF YOUR SURGERY PATIENT SIGNATURE_________________________________  NURSE SIGNATURE__________________________________  ________________________________________________________________________

## 2015-05-28 ENCOUNTER — Encounter (HOSPITAL_COMMUNITY): Payer: Self-pay

## 2015-05-28 ENCOUNTER — Encounter (HOSPITAL_COMMUNITY)
Admission: RE | Admit: 2015-05-28 | Discharge: 2015-05-28 | Disposition: A | Discharge status: Home / Self Care | Payer: Medicare Other | Source: Ambulatory Visit | Attending: Urology | Admitting: Urology

## 2015-05-28 DIAGNOSIS — Z01812 Encounter for preprocedural laboratory examination: Secondary | ICD-10-CM | POA: Diagnosis not present

## 2015-05-28 DIAGNOSIS — N135 Crossing vessel and stricture of ureter without hydronephrosis: Secondary | ICD-10-CM | POA: Insufficient documentation

## 2015-05-28 LAB — BASIC METABOLIC PANEL
Anion gap: 10 (ref 5–15)
BUN: 29 mg/dL — AB (ref 6–20)
CALCIUM: 9.7 mg/dL (ref 8.9–10.3)
CO2: 24 mmol/L (ref 22–32)
CREATININE: 1.91 mg/dL — AB (ref 0.61–1.24)
Chloride: 105 mmol/L (ref 101–111)
GFR calc Af Amer: 36 mL/min — ABNORMAL LOW (ref >60)
GFR, EST NON AFRICAN AMERICAN: 31 mL/min — AB (ref >60)
GLUCOSE: 235 mg/dL — AB (ref 65–99)
POTASSIUM: 4.2 mmol/L (ref 3.5–5.1)
Sodium: 139 mmol/L (ref 135–145)

## 2015-05-29 NOTE — H&P (Signed)
History of Present Illness [name] is 82 years old with the following urologic history:    1) Prostate cancer: He is s/p primary surgical with a radical prostatectomy in 2001 for pT3b N0 Mx, Gleason 3+4=7 adenocarcinoma of the prostate. He developed a biochemical recurrence in December 2006 when his PSA was noted to be 0.6. His PSA had increased slowly and was only 1.4 in January 2010 but further increased to 8.01 in November 2014. At that time, a CT scan was performed that demonstrated a retroperitoneal mass/lymphadenopathy and it was found to be recurrent B cell lymphoma. He had initially been diagnosed with lymphoma in 2006 and was treated with CHOP chemotherapy and rituximab which he stopped in 2008. After his recurrence of lymphoma, he was given options and refused a stem cell transplant and was again treated with chemotherapy and maintenance rituximab. His PSA at the time of his initial consultation with me in November 2015 was 15.83. After reviewing options for management, he and his family wished to avoid systemic therapy unless absolutely necessary. He developed measurable metastatic disease to the bone in November 2016 and bilateral ureteral obstruction due to locally advanced prostate cancer. He began systemic ADT in November 2016.    Nov 2016: Began systemic ADT for measurable metastatic disease    2) Urinary retention: He has a history of urinary retention after a laparoscopic cholecystectomy in December 2014 but passed a voiding trial without problems since then.     3) Bilateral ureteral obstruction: He was incidentally noted to have left hydronephrosis on PET imaging for his lymphoma and confirmed on his bone scan imaging for prostate cancer in early 2016. No clear etiology for obstruction was noted on his imaging including no lymphadenopathy. After a discussion with his family including a discussion about renogram imaging to determine if there was obstruction present, he and  his family chose to proceed with observation and avoid further imaging as they wished to avoid any intervention (such as stenting) understanding the risk of loss of renal function that may occur. In October 2016, he was noted to have development of bilateral hydronephrosis and worsening renal function prompting cystoscopy that revealed a bladder mass as the likely cause of obstruction. He required bilateral ureteral stenting in October 2016 with stabilization of his renal function.    Last stent change: Oct 2016    Interval history:    He follows up today for further evaluation for treatment of his metastatic prostate cancer and has bilateral ureteral obstruction. He has tolerated androgen deprivation therapy very well after beginning this treatment 2 months ago. He has minimal hot flashes if any and denies any change in his energy level. He has no other specific complaints today. He denies any new pain symptoms. He will have some increased daytime urinary frequency but typically only gets up at night once.     Past Medical History Problems  1. History of arthritis (Z87.39) 2. History of diabetes mellitus (Z86.39) 3. History of hyperlipidemia (Z86.39) 4. History of hypertension (Z86.79) 5. History of malignant lymphoma (Z85.72) 6. History of sleep apnea (Z86.69)  Surgical History Problems  1. History of Ankle Repair 2. History of Cystoscopy With Fulguration Small Lesion (5-25mm) 3. History of Cystoscopy With Insertion Of Ureteral Stent Bilateral 4. History of Kidney Surgery 5. History of Prostatectomy Retropubic 6. History of Shoulder Surgery  Current Meds 1. Allopurinol 300 MG Oral Tablet;  Therapy: [id number] to Recorded 2. AmLODIPine Besylate 5 MG Oral Tablet;  Therapy: [id number] to Recorded  3. Atorvastatin Calcium 20 MG Oral Tablet;  Therapy: (Recorded:15Dec2014) to Recorded 4. Besivance 0.6 % Ophthalmic Suspension;  Therapy: [id number] to Recorded 5. Calcium + D3 600-200  MG-UNIT Oral Tablet;  Therapy: (Recorded:06Jan2017) to Recorded 6. Cefuroxime Axetil 250 MG Oral Tablet;  Therapy: [phone] to Recorded 7. Cetirizine HCl - 10 MG Oral Tablet;  Therapy: (Recorded:15Dec2014) to Recorded 8. Cholestyramine 4 GM/DOSE Oral Powder;  Therapy: [id number] to Recorded 9. Durezol 0.05 % Ophthalmic Emulsion;  Therapy: 18Apr2016 to Recorded 10. Fenofibrate 160 MG Oral Tablet;   Therapy: (Recorded:15Dec2014) to Recorded 11. Gabapentin 300 MG Oral Capsule;   Therapy: (Recorded:15Dec2014) to Recorded 12. HumuLIN N 100 UNIT/ML Subcutaneous Suspension;   Therapy: [id number] to Recorded 13. Hydrocodone-Acetaminophen 5-325 MG Oral Tablet;   Therapy: [phone] to Recorded 14. Magnesium Oxide TABS;   Therapy: (Recorded:09Oct2015) to Recorded 15. MetFORMIN HCl - 1000 MG Oral Tablet;   Therapy: (Recorded:15Dec2014) to Recorded 16. Multi-Vitamin TABS;   Therapy: (Recorded:15Dec2014) to Recorded 17. Questran 4 GM/DOSE Oral Powder;   Therapy: (Recorded:05Oct2016) to Recorded 18. Sertraline HCl - 100 MG Oral Tablet;   Therapy: (Recorded:15Dec2014) to Recorded 19. Toujeo SoloStar 300 UNIT/ML Subcutaneous Solution Pen-injector;   Therapy: [id number] to Recorded 20. Vitamin B-12 TABS;   Therapy: (Recorded:15Dec2014) to Recorded 21. Vitamin C TABS;   Therapy: (Recorded:09Oct2015) to Recorded 22. Vitamin D TABS;   Therapy: (Recorded:07Apr2015) to Recorded 23. Xarelto 15 MG Oral Tablet;   Therapy: [id number] to Recorded 24. Zetia 10 MG Oral Tablet;   Therapy: (Recorded:15Dec2014) to Recorded  Allergies Medication  1. Niacin ER CPCR  Family History Problems  1. Family history of cardiac disorder (Z82.49) : Father 2. Family history of diabetes mellitus (Z83.3) : Mother  Social History Problems  1. Former smoker [phone]) 2. Married 3. Non-smoker (Z78.9) 4. Retired  Engineer, site Vital Signs [Data Includes: Last 1 Day]  Recorded: [id number] 03:17PM  Weight: 180 lb  BMI  Calculated: 27.37 BSA Calculated: 1.95 Blood Pressure: 100 / 61 Heart Rate: 81  Physical Exam Constitutional: Well nourished and well developed . No acute distress.    Results/Data Urine [Data Includes: Last 1 Day]   [id number]  COLOR YELLOW   APPEARANCE CLOUDY   SPECIFIC GRAVITY 1.020   pH 6.0   GLUCOSE NEGATIVE   BILIRUBIN NEGATIVE   KETONE NEGATIVE   BLOOD 3+   PROTEIN 1+   NITRITE NEGATIVE   LEUKOCYTE ESTERASE NEGATIVE   SQUAMOUS EPITHELIAL/HPF 0-5 HPF  WBC 0-5 WBC/HPF  RBC >60 RBC/HPF  BACTERIA NONE SEEN HPF  CRYSTALS NONE SEEN HPF  CASTS NONE SEEN LPF  Yeast NONE SEEN HPF   I reviewed his recent laboratory studies from Dr. Jana Hakim. His serum creatinine was 1.8 which is stable. His urinalysis demonstrated hematuria without concern for infection. His urine culture was negative from late December.   Assessment Assessed  1. Acute kidney injury (N17.9) 2. Adenocarcinoma of prostate (C61) 3. Ureteral obstruction (N13.5)  Plan Adenocarcinoma of prostate  1. PSA; Status:Hold For - Specimen/Data Collection; Requested for:05May2017;  2. PSA; Status:In Progress - Specimen/Data Collected;   Done: [id number] 3. TESTOSTERONE; Status:Hold For - Specimen/Data Collection; Requested  for:05May2017;  4. VENIPUNCTURE; Status:Complete;   [id number] 5. Follow-up Office  Follow-up  Status: Hold For - Date of Service  Requested for:  [id number] Health Maintenance  6. UA With REFLEX; [Do Not Release]; Status:Complete;   [id number] 03:08PM  Discussion/Summary 1. Metastatic prostate cancer: He is tolerating therapy well. He will be due for his next 45 mg Lupron injection  toward late April or early May. His PSA will be checked today.    2. Bilateral ureteral obstruction: He does need to undergo cystoscopy and stent change either toward the end of January or early February. We reviewed that procedure in detail today including the potential risks, complications, and expected  recovery process. He gives informed consent to proceed.    Cc: Dr. Gunnar Bulla Magrinat  Dr. [name]     Amendment  Cr 1.91 on hospital labs.1     1 Amended By: Raynelle Bring; May 28 2015 12:05 PM EST  Verified Results PSA1 [id number] 03:36PM1 Read Drivers  SPECIMEN TYPE: BLOOD  [May 02, 2015 7:18AM Chenay Nesmith] Please notify patient and his daughter that his PSA has decreased to 0.74 which is a great response to treatment initially. He should keep plan for stent change and followup as we discussed at his visit.   Test Name Result Flag Reference  PSA1 0.74 ng/mL1  <=4.001  RESULT REPEATED AND VERIFIED. TEST METHODOLOGY: ECLIA PSA (ELECTROCHEMILUMINESCENCE IMMUNOASSAY)     1. Amended By: Raynelle Bring; May 02 2015 7:18 AM EST  Signatures Electronically signed by : Raynelle Bring, M.D.; May 28 2015 12:05PM EST

## 2015-06-01 ENCOUNTER — Ambulatory Visit: Payer: Medicare Other

## 2015-06-01 ENCOUNTER — Encounter (HOSPITAL_COMMUNITY): Payer: Self-pay | Admitting: *Deleted

## 2015-06-01 ENCOUNTER — Ambulatory Visit (HOSPITAL_COMMUNITY): Payer: Medicare Other | Admitting: Certified Registered"

## 2015-06-01 ENCOUNTER — Ambulatory Visit (HOSPITAL_COMMUNITY)
Admission: RE | Admit: 2015-06-01 | Discharge: 2015-06-01 | Disposition: A | Discharge status: Home / Self Care | Payer: Medicare Other | Source: Ambulatory Visit | Attending: Urology | Admitting: Urology

## 2015-06-01 ENCOUNTER — Encounter (HOSPITAL_COMMUNITY)
Admission: RE | Disposition: A | Discharge status: Home / Self Care | Payer: Self-pay | Source: Ambulatory Visit | Attending: Urology

## 2015-06-01 ENCOUNTER — Other Ambulatory Visit: Payer: Medicare Other

## 2015-06-01 DIAGNOSIS — E119 Type 2 diabetes mellitus without complications: Secondary | ICD-10-CM | POA: Diagnosis not present

## 2015-06-01 DIAGNOSIS — C61 Malignant neoplasm of prostate: Secondary | ICD-10-CM | POA: Diagnosis not present

## 2015-06-01 DIAGNOSIS — Z87891 Personal history of nicotine dependence: Secondary | ICD-10-CM | POA: Diagnosis not present

## 2015-06-01 DIAGNOSIS — Z7984 Long term (current) use of oral hypoglycemic drugs: Secondary | ICD-10-CM | POA: Diagnosis not present

## 2015-06-01 DIAGNOSIS — Z79891 Long term (current) use of opiate analgesic: Secondary | ICD-10-CM | POA: Insufficient documentation

## 2015-06-01 DIAGNOSIS — Z9049 Acquired absence of other specified parts of digestive tract: Secondary | ICD-10-CM | POA: Insufficient documentation

## 2015-06-01 DIAGNOSIS — E785 Hyperlipidemia, unspecified: Secondary | ICD-10-CM | POA: Diagnosis not present

## 2015-06-01 DIAGNOSIS — Z8572 Personal history of non-Hodgkin lymphomas: Secondary | ICD-10-CM | POA: Diagnosis not present

## 2015-06-01 DIAGNOSIS — Z9079 Acquired absence of other genital organ(s): Secondary | ICD-10-CM | POA: Insufficient documentation

## 2015-06-01 DIAGNOSIS — Z9221 Personal history of antineoplastic chemotherapy: Secondary | ICD-10-CM | POA: Diagnosis not present

## 2015-06-01 DIAGNOSIS — C7951 Secondary malignant neoplasm of bone: Secondary | ICD-10-CM | POA: Insufficient documentation

## 2015-06-01 DIAGNOSIS — G4730 Sleep apnea, unspecified: Secondary | ICD-10-CM | POA: Insufficient documentation

## 2015-06-01 DIAGNOSIS — M1990 Unspecified osteoarthritis, unspecified site: Secondary | ICD-10-CM | POA: Diagnosis not present

## 2015-06-01 DIAGNOSIS — Z794 Long term (current) use of insulin: Secondary | ICD-10-CM | POA: Insufficient documentation

## 2015-06-01 DIAGNOSIS — I495 Sick sinus syndrome: Secondary | ICD-10-CM | POA: Insufficient documentation

## 2015-06-01 DIAGNOSIS — Z79899 Other long term (current) drug therapy: Secondary | ICD-10-CM | POA: Insufficient documentation

## 2015-06-01 DIAGNOSIS — N135 Crossing vessel and stricture of ureter without hydronephrosis: Secondary | ICD-10-CM | POA: Diagnosis not present

## 2015-06-01 DIAGNOSIS — I2510 Atherosclerotic heart disease of native coronary artery without angina pectoris: Secondary | ICD-10-CM | POA: Insufficient documentation

## 2015-06-01 DIAGNOSIS — N131 Hydronephrosis with ureteral stricture, not elsewhere classified: Secondary | ICD-10-CM | POA: Diagnosis not present

## 2015-06-01 DIAGNOSIS — I10 Essential (primary) hypertension: Secondary | ICD-10-CM | POA: Insufficient documentation

## 2015-06-01 DIAGNOSIS — Z7901 Long term (current) use of anticoagulants: Secondary | ICD-10-CM | POA: Diagnosis not present

## 2015-06-01 DIAGNOSIS — E1140 Type 2 diabetes mellitus with diabetic neuropathy, unspecified: Secondary | ICD-10-CM | POA: Diagnosis not present

## 2015-06-01 DIAGNOSIS — I4891 Unspecified atrial fibrillation: Secondary | ICD-10-CM | POA: Diagnosis not present

## 2015-06-01 HISTORY — PX: CYSTOSCOPY W/ URETERAL STENT PLACEMENT: SHX1429

## 2015-06-01 LAB — GLUCOSE, CAPILLARY
Glucose-Capillary: 204 mg/dL — ABNORMAL HIGH (ref 65–99)
Glucose-Capillary: 196 mg/dL — ABNORMAL HIGH (ref 65–99)

## 2015-06-01 LAB — PROTIME-INR
INR: 1.75 — AB (ref 0.00–1.49)
PROTHROMBIN TIME: 20.4 s — AB (ref 11.6–15.2)

## 2015-06-01 SURGERY — CYSTOSCOPY, FLEXIBLE, WITH STENT REPLACEMENT
Anesthesia: General | Site: Ureter | Laterality: Bilateral

## 2015-06-01 MED ORDER — LIDOCAINE HCL (CARDIAC) 20 MG/ML IV SOLN
INTRAVENOUS | Status: AC
  Filled 2015-06-01: qty 5

## 2015-06-01 MED ORDER — LACTATED RINGERS IV SOLN
INTRAVENOUS | Status: DC
  Administered 2015-06-01: 1000 mL via INTRAVENOUS

## 2015-06-01 MED ORDER — FENTANYL CITRATE (PF) 100 MCG/2ML IJ SOLN: 25.0000 ug | INTRAMUSCULAR | Status: DC | PRN

## 2015-06-01 MED ORDER — LIDOCAINE HCL (CARDIAC) 20 MG/ML IV SOLN
INTRAVENOUS | Status: DC | PRN
  Administered 2015-06-01: 60 mg via INTRAVENOUS

## 2015-06-01 MED ORDER — STERILE WATER FOR IRRIGATION IR SOLN
Status: DC | PRN
  Administered 2015-06-01: 1000 mL

## 2015-06-01 MED ORDER — EPHEDRINE SULFATE 50 MG/ML IJ SOLN
INTRAMUSCULAR | Status: DC | PRN
  Administered 2015-06-01: 10 mg via INTRAVENOUS
  Administered 2015-06-01: 5 mg via INTRAVENOUS

## 2015-06-01 MED ORDER — CIPROFLOXACIN IN D5W 400 MG/200ML IV SOLN
INTRAVENOUS | Status: AC
  Filled 2015-06-01: qty 200

## 2015-06-01 MED ORDER — FENTANYL CITRATE (PF) 100 MCG/2ML IJ SOLN
INTRAMUSCULAR | Status: AC
  Filled 2015-06-01: qty 2

## 2015-06-01 MED ORDER — CIPROFLOXACIN IN D5W 400 MG/200ML IV SOLN
400.0000 mg | INTRAVENOUS | Status: AC
  Administered 2015-06-01: 400 mg via INTRAVENOUS

## 2015-06-01 MED ORDER — PROPOFOL 10 MG/ML IV BOLUS
INTRAVENOUS | Status: AC
  Filled 2015-06-01: qty 20

## 2015-06-01 MED ORDER — ONDANSETRON HCL 4 MG/2ML IJ SOLN
INTRAMUSCULAR | Status: DC | PRN
  Administered 2015-06-01 (×2): 2 mg via INTRAVENOUS

## 2015-06-01 MED ORDER — PROPOFOL 10 MG/ML IV BOLUS
INTRAVENOUS | Status: DC | PRN
  Administered 2015-06-01: 140 mg via INTRAVENOUS

## 2015-06-01 MED ORDER — FENTANYL CITRATE (PF) 100 MCG/2ML IJ SOLN
INTRAMUSCULAR | Status: DC | PRN
  Administered 2015-06-01: 25 ug via INTRAVENOUS

## 2015-06-01 SURGICAL SUPPLY — 12 items
BAG URO CATCHER STRL LF (MISCELLANEOUS) ×3 IMPLANT
CATH INTERMIT  6FR 70CM (CATHETERS) IMPLANT
CLOTH BEACON ORANGE TIMEOUT ST (SAFETY) ×3 IMPLANT
GLOVE BIOGEL M STRL SZ7.5 (GLOVE) ×3 IMPLANT
GOWN STRL REUS W/TWL LRG LVL3 (GOWN DISPOSABLE) ×6 IMPLANT
GUIDEWIRE ANG ZIPWIRE 038X150 (WIRE) IMPLANT
GUIDEWIRE STR DUAL SENSOR (WIRE) ×3 IMPLANT
MANIFOLD NEPTUNE II (INSTRUMENTS) ×3 IMPLANT
PACK CYSTO (CUSTOM PROCEDURE TRAY) ×3 IMPLANT
STENT URET 6FRX24 CONTOUR (STENTS) ×6 IMPLANT
TUBING CONNECTING 10 (TUBING) ×2 IMPLANT
TUBING CONNECTING 10' (TUBING) ×1

## 2015-06-01 NOTE — Anesthesia Procedure Notes (Signed)
Procedure Name: LMA Insertion Date/Time: 06/01/2015 8:49 AM Performed by: Freddie Breech Pre-anesthesia Checklist: Patient identified, Emergency Drugs available, Suction available, Patient being monitored and Timeout performed Patient Re-evaluated:Patient Re-evaluated prior to inductionOxygen Delivery Method: Circle system utilized Preoxygenation: Pre-oxygenation with 100% oxygen Intubation Type: IV induction LMA: LMA inserted LMA Size: 4.0 Number of attempts: 1 Placement Confirmation: positive ETCO2,  CO2 detector and breath sounds checked- equal and bilateral Tube secured with: Tape Dental Injury: Teeth and Oropharynx as per pre-operative assessment

## 2015-06-01 NOTE — Transfer of Care (Signed)
Immediate Anesthesia Transfer of Care Note  Patient: [name]  Procedure(s) Performed: Procedure(s): CYSTOSCOPY WITH BILATERAL STENT REPLACEMENT (Bilateral)  Patient Location: PACU  Anesthesia Type:General  Level of Consciousness:  sedated, patient cooperative and responds to stimulation  Airway & Oxygen Therapy:Patient Spontanous Breathing and Patient connected to face mask oxgen  Post-op Assessment:  Report given to PACU RN and Post -op Vital signs reviewed and stable  Post vital signs:  Reviewed and stable  Last Vitals:  Filed Vitals:   06/01/15 0633  BP: 134/60  Pulse: 70  Temp: 36.6 C  Resp: 16    Complications: No apparent anesthesia complications

## 2015-06-01 NOTE — Op Note (Signed)
Preoperative diagnosis:  1. Bilateral ureteral obstruction 2. Metastatic prostate cancer   Postoperative diagnosis:  1. Bilateral ureteral obstrucion 2. Metastatic prostate cancer   Procedure:  1. Cystoscopy 2. Bilateral ureteral stent placement (6 x 24)  Surgeon: Roxy Horseman, Brooke Bonito. M.D.  Anesthesia: General  Complications: None  Intraoperative findings: There was moderate encrustation of each of his ureteral stents.  EBL: Minimal  Specimens: None  Indication: [name] is a 82 y.o. patient with bilateral ureteral obstruction. After reviewing the management options for treatment, he elected to proceed with the above surgical procedure(s). We have discussed the potential benefits and risks of the procedure, side effects of the proposed treatment, the likelihood of the patient achieving the goals of the procedure, and any potential problems that might occur during the procedure or recuperation. Informed consent has been obtained.  Description of procedure:  The patient was taken to the operating room and general anesthesia was induced.  The patient was placed in the dorsal lithotomy position, prepped and draped in the usual sterile fashion, and preoperative antibiotics were administered. A preoperative time-out was performed.   Cystourethroscopy was performed.  The patient's urethra was examined and was unremarkable. The bladder was then systematically examined in its entirety. There was no evidence for any bladder tumors, stones, or other mucosal pathology.    Attention then turned to the right ureteral orifice and the patient's indwelling ureteral stent was identified and brought out to the urethral meatus with the flexible graspers.  A 0.38 sensor guidewire was then advanced up the right ureter into the renal pelvis under fluoroscopic guidance.  The wire was then backloaded through the cystoscope and a ureteral stent was advance over the wire using Seldinger technique.   The stent was positioned appropriately under fluoroscopic and cystoscopic guidance.  The wire was then removed with an adequate stent curl noted in the renal pelvis as well as in the bladder.  Attention then turned to the left ureteral orifice and the patient's indwelling ureteral stent was identified and brought out to the urethral meatus with the flexible graspers.  A 0.38 sensor guidewire was then advanced up the left ureter into the renal pelvis under fluoroscopic guidance.  The wire was then backloaded through the cystoscope and a ureteral stent was advance over the wire using Seldinger technique.  The stent was positioned appropriately under fluoroscopic and cystoscopic guidance.  The wire was then removed with an adequate stent curl noted in the renal pelvis as well as in the bladder.  The bladder was then emptied and the procedure ended.  The patient appeared to tolerate the procedure well and without complications.  The patient was able to be awakened and transferred to the recovery unit in satisfactory condition.    Pryor Curia MD

## 2015-06-01 NOTE — Interval H&P Note (Signed)
History and Physical Interval Note:  06/01/2015 7:56 AM  [name]  has presented today for surgery, with the diagnosis of BILATERAL URETERAL OBSTRUCTION  The various methods of treatment have been discussed with the patient and family. After consideration of risks, benefits and other options for treatment, the patient has consented to  Procedure(s): CYSTOSCOPY WITH BILATERAL STENT REPLACEMENT (Bilateral) as a surgical intervention .  The patient's history has been reviewed, patient examined, no change in status, stable for surgery.  I have reviewed the patient's chart and labs.  Questions were answered to the patient's satisfaction.     Rana Hochstein,LES

## 2015-06-01 NOTE — Anesthesia Preprocedure Evaluation (Signed)
Anesthesia Evaluation  Patient identified by MRN, date of birth, ID band Patient awake    Reviewed: Allergy & Precautions, H&P , NPO status , Patient's Chart, lab work & pertinent test results  Airway Mallampati: II  TM Distance: >3 FB Neck ROM: full    Dental no notable dental hx. (+) Dental Advisory Given, Missing Front upper teeth missing:   Pulmonary neg pulmonary ROS, sleep apnea and Continuous Positive Airway Pressure Ventilation , former smoker,    Pulmonary exam normal breath sounds clear to auscultation       Cardiovascular Exercise Tolerance: Good hypertension, + CAD  negative cardio ROS Normal cardiovascular exam+ dysrhythmias Atrial Fibrillation  Rhythm:regular Rate:Normal  Sick sinus syndrome   Neuro/Psych neuropathy negative neurological ROS  negative psych ROS   GI/Hepatic negative GI ROS, Neg liver ROS,   Endo/Other  negative endocrine ROSdiabetes, Well Controlled, Type 2, Oral Hypoglycemic Agents  Renal/GU negative Renal ROSCRT 1.91  negative genitourinary   Musculoskeletal   Abdominal   Peds  Hematology negative hematology ROS (+) Non-Hodgkin's lymphoma   Anesthesia Other Findings   Reproductive/Obstetrics negative OB ROS                             Anesthesia Physical Anesthesia Plan  ASA: III  Anesthesia Plan: General   Post-op Pain Management:    Induction: Intravenous  Airway Management Planned: LMA  Additional Equipment:   Intra-op Plan:   Post-operative Plan:   Informed Consent: I have reviewed the patients History and Physical, chart, labs and discussed the procedure including the risks, benefits and alternatives for the proposed anesthesia with the patient or authorized representative who has indicated his/her understanding and acceptance.   Dental Advisory Given  Plan Discussed with: CRNA and Surgeon  Anesthesia Plan Comments:          Anesthesia Quick Evaluation

## 2015-06-01 NOTE — Discharge Instructions (Addendum)
1. You may see some blood in the urine and may have some burning with urination for 48-72 hours. You also may notice that you have to urinate more frequently or urgently after your procedure which is normal.  °2. You should call should you develop an inability urinate, fever > 101, persistent nausea and vomiting that prevents you from eating or drinking to stay hydrated.  °3. If you have a stent, you will likely urinate more frequently and urgently until the stent is removed and you may experience some discomfort/pain in the lower abdomen and flank especially when urinating. You may take pain medication prescribed to you if needed for pain. You may also intermittently have blood in the urine until the stent is removed. ° ° ° ° ° ° °General Anesthesia, Adult, Care After °Refer to this sheet in the next few weeks. These instructions provide you with information on caring for yourself after your procedure. Your health care provider may also give you more specific instructions. Your treatment has been planned according to current medical practices, but problems sometimes occur. Call your health care provider if you have any problems or questions after your procedure. °WHAT TO EXPECT AFTER THE PROCEDURE °After the procedure, it is typical to experience: °· Sleepiness. °· Nausea and vomiting. °HOME CARE INSTRUCTIONS °· For the first 24 hours after general anesthesia: °¨ Have a responsible person with you. °¨ Do not drive a car. If you are alone, do not take public transportation. °¨ Do not drink alcohol. °¨ Do not take medicine that has not been prescribed by your health care provider. °¨ Do not sign important papers or make important decisions. °¨ You may resume a normal diet and activities as directed by your health care provider. °· Change bandages (dressings) as directed. °· If you have questions or problems that seem related to general anesthesia, call the hospital and ask for the anesthetist or anesthesiologist on  call. °SEEK MEDICAL CARE IF: °· You have nausea and vomiting that continue the day after anesthesia. °· You develop a rash. °SEEK IMMEDIATE MEDICAL CARE IF:  °· You have difficulty breathing. °· You have chest pain. °· You have any allergic problems. °  °This information is not intended to replace advice given to you by your health care provider. Make sure you discuss any questions you have with your health care provider. °  °Document Released: 07/18/2000 Document Revised: 05/02/2014 Document Reviewed: 08/10/2011 °Elsevier Interactive Patient Education ©2016 [employer] ° °

## 2015-06-01 NOTE — Anesthesia Postprocedure Evaluation (Signed)
Anesthesia Post Note  Patient: CHANCELLOR FINFROCK  Procedure(s) Performed: Procedure(s) (LRB): CYSTOSCOPY WITH BILATERAL STENT REPLACEMENT (Bilateral)  Patient location during evaluation: PACU Anesthesia Type: General Level of consciousness: awake and alert Pain management: pain level controlled Vital Signs Assessment: post-procedure vital signs reviewed and stable Respiratory status: spontaneous breathing, nonlabored ventilation, respiratory function stable and patient connected to nasal cannula oxygen Cardiovascular status: blood pressure returned to baseline and stable Postop Assessment: no signs of nausea or vomiting Anesthetic complications: no    Last Vitals:  Filed Vitals:   06/01/15 1001 06/01/15 1055  BP: 129/63 142/59  Pulse: 69 67  Temp: 36.5 C 36.4 C  Resp: 14 16    Last Pain: There were no vitals filed for this visit.               Nyajah Hyson L

## 2015-06-05 ENCOUNTER — Other Ambulatory Visit (HOSPITAL_BASED_OUTPATIENT_CLINIC_OR_DEPARTMENT_OTHER): Payer: Medicare Other

## 2015-06-05 ENCOUNTER — Ambulatory Visit: Payer: Medicare Other

## 2015-06-05 DIAGNOSIS — C8590 Non-Hodgkin lymphoma, unspecified, unspecified site: Secondary | ICD-10-CM

## 2015-06-05 DIAGNOSIS — N183 Chronic kidney disease, stage 3 unspecified: Secondary | ICD-10-CM

## 2015-06-05 DIAGNOSIS — C8580 Other specified types of non-Hodgkin lymphoma, unspecified site: Secondary | ICD-10-CM

## 2015-06-05 DIAGNOSIS — D630 Anemia in neoplastic disease: Secondary | ICD-10-CM

## 2015-06-05 DIAGNOSIS — C8593 Non-Hodgkin lymphoma, unspecified, intra-abdominal lymph nodes: Secondary | ICD-10-CM

## 2015-06-05 LAB — COMPREHENSIVE METABOLIC PANEL
ALT: 48 U/L (ref 0–55)
ANION GAP: 13 meq/L — AB (ref 3–11)
AST: 41 U/L — ABNORMAL HIGH (ref 5–34)
Albumin: 4.3 g/dL (ref 3.5–5.0)
Alkaline Phosphatase: 69 U/L (ref 40–150)
BUN: 30.9 mg/dL — ABNORMAL HIGH (ref 7.0–26.0)
CALCIUM: 10.1 mg/dL (ref 8.4–10.4)
CHLORIDE: 101 meq/L (ref 98–109)
CO2: 22 meq/L (ref 22–29)
Creatinine: 1.8 mg/dL — ABNORMAL HIGH (ref 0.7–1.3)
EGFR: 34 mL/min/{1.73_m2} — AB (ref >90)
Glucose: 177 mg/dl — ABNORMAL HIGH (ref 70–140)
Potassium: 4.1 mEq/L (ref 3.5–5.1)
Sodium: 136 mEq/L (ref 136–145)
Total Bilirubin: 0.44 mg/dL (ref 0.20–1.20)
Total Protein: 7.3 g/dL (ref 6.4–8.3)

## 2015-06-05 LAB — CBC WITH DIFFERENTIAL/PLATELET
BASO%: 0.1 % (ref 0.0–2.0)
BASOS ABS: 0 10*3/uL (ref 0.0–0.1)
EOS ABS: 0.2 10*3/uL (ref 0.0–0.5)
EOS%: 3.4 % (ref 0.0–7.0)
HCT: 35.2 % — ABNORMAL LOW (ref 38.4–49.9)
HGB: 11.9 g/dL — ABNORMAL LOW (ref 13.0–17.1)
LYMPH#: 0.6 10*3/uL — AB (ref 0.9–3.3)
LYMPH%: 9.1 % — AB (ref 14.0–49.0)
MCH: 29 pg (ref 27.2–33.4)
MCHC: 33.8 g/dL (ref 32.0–36.0)
MCV: 85.6 fL (ref 79.3–98.0)
MONO#: 0.3 10*3/uL (ref 0.1–0.9)
MONO%: 4.6 % (ref 0.0–14.0)
NEUT#: 5.8 10*3/uL (ref 1.5–6.5)
NEUT%: 82.8 % — AB (ref 39.0–75.0)
PLATELETS: 147 10*3/uL (ref 140–400)
RBC: 4.11 10*6/uL — AB (ref 4.20–5.82)
RDW: 14.5 % (ref 11.0–14.6)
WBC: 7 10*3/uL (ref 4.0–10.3)

## 2015-06-05 MED ORDER — DARBEPOETIN ALFA 300 MCG/0.6ML IJ SOSY: 300.0000 ug | PREFILLED_SYRINGE | Freq: Once | INTRAMUSCULAR | Status: DC

## 2015-06-16 ENCOUNTER — Other Ambulatory Visit: Payer: Self-pay | Admitting: *Deleted

## 2015-06-16 MED ORDER — ALLOPURINOL 300 MG PO TABS: 300.0000 mg | ORAL_TABLET | Freq: Every day | ORAL | Status: DC

## 2015-06-18 ENCOUNTER — Other Ambulatory Visit (HOSPITAL_BASED_OUTPATIENT_CLINIC_OR_DEPARTMENT_OTHER): Payer: Medicare Other

## 2015-06-18 ENCOUNTER — Ambulatory Visit (HOSPITAL_BASED_OUTPATIENT_CLINIC_OR_DEPARTMENT_OTHER): Payer: Medicare Other | Admitting: Oncology

## 2015-06-18 ENCOUNTER — Ambulatory Visit: Payer: Medicare Other

## 2015-06-18 ENCOUNTER — Other Ambulatory Visit: Payer: Medicare Other

## 2015-06-18 ENCOUNTER — Telehealth: Payer: Self-pay | Admitting: Oncology

## 2015-06-18 ENCOUNTER — Ambulatory Visit (HOSPITAL_BASED_OUTPATIENT_CLINIC_OR_DEPARTMENT_OTHER): Payer: Medicare Other

## 2015-06-18 VITALS — BP 120/47 | HR 71 | Temp 98.2°F | Resp 17 | Wt 182.5 lb

## 2015-06-18 VITALS — BP 112/57 | HR 69 | Temp 98.1°F | Resp 20

## 2015-06-18 DIAGNOSIS — C8580 Other specified types of non-Hodgkin lymphoma, unspecified site: Secondary | ICD-10-CM

## 2015-06-18 DIAGNOSIS — C8599 Non-Hodgkin lymphoma, unspecified, extranodal and solid organ sites: Secondary | ICD-10-CM | POA: Diagnosis not present

## 2015-06-18 DIAGNOSIS — D638 Anemia in other chronic diseases classified elsewhere: Secondary | ICD-10-CM

## 2015-06-18 DIAGNOSIS — Z5111 Encounter for antineoplastic chemotherapy: Secondary | ICD-10-CM

## 2015-06-18 DIAGNOSIS — N289 Disorder of kidney and ureter, unspecified: Secondary | ICD-10-CM | POA: Diagnosis not present

## 2015-06-18 DIAGNOSIS — Z95828 Presence of other vascular implants and grafts: Secondary | ICD-10-CM

## 2015-06-18 DIAGNOSIS — C8203 Follicular lymphoma grade I, intra-abdominal lymph nodes: Secondary | ICD-10-CM

## 2015-06-18 DIAGNOSIS — Z7984 Long term (current) use of oral hypoglycemic drugs: Secondary | ICD-10-CM

## 2015-06-18 DIAGNOSIS — D649 Anemia, unspecified: Secondary | ICD-10-CM | POA: Diagnosis not present

## 2015-06-18 DIAGNOSIS — I4891 Unspecified atrial fibrillation: Secondary | ICD-10-CM | POA: Diagnosis not present

## 2015-06-18 DIAGNOSIS — C8590 Non-Hodgkin lymphoma, unspecified, unspecified site: Secondary | ICD-10-CM

## 2015-06-18 DIAGNOSIS — E119 Type 2 diabetes mellitus without complications: Secondary | ICD-10-CM

## 2015-06-18 DIAGNOSIS — C61 Malignant neoplasm of prostate: Secondary | ICD-10-CM

## 2015-06-18 LAB — COMPREHENSIVE METABOLIC PANEL
ALT: 32 U/L (ref 0–55)
ANION GAP: 11 meq/L (ref 3–11)
AST: 29 U/L (ref 5–34)
Albumin: 4 g/dL (ref 3.5–5.0)
Alkaline Phosphatase: 61 U/L (ref 40–150)
BUN: 25.6 mg/dL (ref 7.0–26.0)
CHLORIDE: 103 meq/L (ref 98–109)
CO2: 22 meq/L (ref 22–29)
CREATININE: 1.9 mg/dL — AB (ref 0.7–1.3)
Calcium: 9.9 mg/dL (ref 8.4–10.4)
EGFR: 32 mL/min/{1.73_m2} — ABNORMAL LOW (ref >90)
GLUCOSE: 229 mg/dL — AB (ref 70–140)
Potassium: 4.5 mEq/L (ref 3.5–5.1)
Sodium: 136 mEq/L (ref 136–145)
Total Bilirubin: 0.46 mg/dL (ref 0.20–1.20)
Total Protein: 6.8 g/dL (ref 6.4–8.3)

## 2015-06-18 LAB — CBC WITH DIFFERENTIAL/PLATELET
BASO%: 0.4 % (ref 0.0–2.0)
Basophils Absolute: 0 10*3/uL (ref 0.0–0.1)
EOS ABS: 0.2 10*3/uL (ref 0.0–0.5)
EOS%: 4.4 % (ref 0.0–7.0)
HEMATOCRIT: 32.4 % — AB (ref 38.4–49.9)
HEMOGLOBIN: 10.9 g/dL — AB (ref 13.0–17.1)
LYMPH#: 0.2 10*3/uL — AB (ref 0.9–3.3)
LYMPH%: 4.8 % — ABNORMAL LOW (ref 14.0–49.0)
MCH: 29 pg (ref 27.2–33.4)
MCHC: 33.6 g/dL (ref 32.0–36.0)
MCV: 86.3 fL (ref 79.3–98.0)
MONO#: 0.5 10*3/uL (ref 0.1–0.9)
MONO%: 9 % (ref 0.0–14.0)
NEUT%: 81.4 % — ABNORMAL HIGH (ref 39.0–75.0)
NEUTROS ABS: 4.1 10*3/uL (ref 1.5–6.5)
PLATELETS: 147 10*3/uL (ref 140–400)
RBC: 3.76 10*6/uL — AB (ref 4.20–5.82)
RDW: 15.3 % — ABNORMAL HIGH (ref 11.0–14.6)
WBC: 5.1 10*3/uL (ref 4.0–10.3)

## 2015-06-18 LAB — TECHNOLOGIST REVIEW

## 2015-06-18 MED ORDER — DIPHENHYDRAMINE HCL 25 MG PO CAPS
25.0000 mg | ORAL_CAPSULE | Freq: Once | ORAL | Status: AC
  Administered 2015-06-18: 25 mg via ORAL

## 2015-06-18 MED ORDER — DIPHENHYDRAMINE HCL 25 MG PO CAPS
ORAL_CAPSULE | ORAL | Status: AC
  Filled 2015-06-18: qty 1

## 2015-06-18 MED ORDER — HEPARIN SOD (PORK) LOCK FLUSH 100 UNIT/ML IV SOLN
500.0000 [IU] | Freq: Once | INTRAVENOUS | Status: AC | PRN
  Administered 2015-06-18: 500 [IU]
  Filled 2015-06-18: qty 5

## 2015-06-18 MED ORDER — SODIUM CHLORIDE 0.9 % IV SOLN
Freq: Once | INTRAVENOUS | Status: AC
Start: 2015-06-18 — End: 2015-06-18
  Administered 2015-06-18: 11:00:00 via INTRAVENOUS

## 2015-06-18 MED ORDER — ACETAMINOPHEN 325 MG PO TABS
ORAL_TABLET | ORAL | Status: AC
  Filled 2015-06-18: qty 2

## 2015-06-18 MED ORDER — SODIUM CHLORIDE 0.9 % IJ SOLN
10.0000 mL | INTRAMUSCULAR | Status: DC | PRN
  Administered 2015-06-18: 10 mL
  Filled 2015-06-18: qty 10

## 2015-06-18 MED ORDER — SODIUM CHLORIDE 0.9% FLUSH
10.0000 mL | INTRAVENOUS | Status: DC | PRN
  Administered 2015-06-18: 10 mL via INTRAVENOUS
  Filled 2015-06-18: qty 10

## 2015-06-18 MED ORDER — SODIUM CHLORIDE 0.9 % IV SOLN
375.0000 mg/m2 | Freq: Once | INTRAVENOUS | Status: AC
  Administered 2015-06-18: 800 mg via INTRAVENOUS
  Filled 2015-06-18: qty 70

## 2015-06-18 MED ORDER — ACETAMINOPHEN 325 MG PO TABS
650.0000 mg | ORAL_TABLET | Freq: Once | ORAL | Status: AC
  Administered 2015-06-18: 650 mg via ORAL

## 2015-06-18 NOTE — Telephone Encounter (Signed)
appt made and avs printed °

## 2015-06-18 NOTE — Patient Instructions (Signed)
[facility] Discharge Instructions for Patients Receiving Chemotherapy  Today you received the following chemotherapy agents Rituxan  To help prevent nausea and vomiting after your treatment, we encourage you to take your nausea medication    If you develop nausea and vomiting that is not controlled by your nausea medication, call the clinic.   BELOW ARE SYMPTOMS THAT SHOULD BE REPORTED IMMEDIATELY:  *FEVER GREATER THAN 100.5 F  *CHILLS WITH OR WITHOUT FEVER  NAUSEA AND VOMITING THAT IS NOT CONTROLLED WITH YOUR NAUSEA MEDICATION  *UNUSUAL [name]  *UNUSUAL BRUISING OR BLEEDING  TENDERNESS IN MOUTH AND THROAT WITH OR WITHOUT PRESENCE OF ULCERS  *URINARY PROBLEMS  *BOWEL PROBLEMS  UNUSUAL RASH Items with * indicate a potential emergency and should be followed up as soon as possible.  Feel free to call the clinic you have any questions or concerns. The clinic phone number is [phone].  Please show the CHEMO ALERT CARD at check-in to the Emergency Department and triage nurse.   

## 2015-06-18 NOTE — Progress Notes (Signed)
ID: [name] OB: [birth year]  MR#: [id number]  RWE#:[id number]  PCP: Michael Pel, MD GYN:   SU: [name] OTHER MD: [name], [name],  [name], [name], [name]  CHIEF COMPLAINT: Non-Hodgkin's lymphoma, stage IV prostate cancer  CURRENT TREATMENT: Maintenance rituximab, starting Aranesp, considering Denosumab  NON-HODGKIN"S LYMPHOMA HISTORY: From the prior summary:  The patient developed right upper quadrant pain last year and he had an ultrasound April 5th which showed some gallstones without evidence of cholecystitis or ductal dilatation.  However, there were multiple lesions in the liver which could not be assessed further.  Accordingly on July 31, 2003, [name], showed multiple liver masses, more than 25, most over 1 cm, the largest being in the inferior right lobe, measuring 4.2 cm. There was also a small mass in the spleen.  CT of the pelvis was unremarkable.   The patient had a biopsy of the liver August 01, 2003.  The report says only that it was "a lesion in the right lobe of the liver".  Presumably this was the largest lesion present.  The final pathology [id number] [id number] and [phone]) showed only cirrhosis.     The patient has been followed by [name], and a repeat CT scan of the abdomen and pelvis was obtained May 18, 2004.  Many of the liver lesions seen previously had actually decreased in size.  However, [name], [name], a second ultrasound-guided biopsy was performed 06/11/04. This was a lesion deep in the left lobe of the liver and therefore, I would think  not the same one previously biopsied which was in the right liver. The pathology this time ([id number]) shows a poorly differentiated neuroendocrine carcinoma which was positive for chromogranin A, negative for synaptophysin, thyroid transcription factor, a variety of cytokeratins, [name], alpha-fetoprotein and COX-2."  [name] was subsequently evaluated at [facility] by Dr. Otelia [name] and repeat liver biopsy and review of the earlier biopsy here showed a primary hepatic lymphoma. The patient was treated with R-CHOP as detailed below and achieved a complete response. He received maintenance rituximab until 2008  His subsequent history is as detailed below  INTERVAL HISTORY. [name] returns today follow up of his primary hepatic lymphoma, accompanied by one of his daughters. Today is day 1, cycle 12 of rituximab maintenance given every 8 weeks.  The plan is to continue this indefinitely so long as there is no evidence of disease progression.  REVIEW OF SYSTEMS:  [name] tolerates the right rituximab with no side effects that he is aware of.  He is working in his garden already and he has some greens and onions coming up area he continues to be crazy about his to a half-year-old dog. He has mild back pain in the morning which is relieved when he urinates. He has nocturia 1. He does not know his current PSA and he is not sure what the medication is that he receives through Dr. [name] (most likely Lupron).  He has rare hot flashes.He had his stents exchanged about 3 weeks ago and that went well. Sometimes  he gets a little bit short of breath when he walks and he can be a little wobbly at times but there have been no falls.  He denies fever, rash, unexplained fatigue or  Unexplained weight loss, or any adenopathy. The detailed review of systems was otherwise stable  PAST MEDICAL HISTORY: Past Medical History  Diagnosis Date  . Hypertension   . Cancer of liver ([place])   . Kidney stone   . Prostate cancer ([place])   .  Skin cancer   . Arthritis   . Anemia   . GERD (gastroesophageal reflux disease)   . Lymphoma ([place])   . [name]   . Neuropathy in diabetes ([place])     Hx: of  . Diabetes mellitus     INSULIN DEPENDENT  . HX, PERSONAL, MALIGNANCY, PROSTATE 07/28/2006    Annotation: 2001, resected Qualifier: Diagnosis of  By: [name], [name]    . A-fib ([place])   . Near syncope 10/18/2013  . Memory deficit 10/18/2013  . HOH (hard of hearing)   . Sleep apnea     on CPAP   Significant for prostate cancer, the patient undergoing prostatectomy January 2001 under [facility] for a Gleason 7, pathologic T3b (positive seminal vesicle involvement) adenocarcinoma with 0 of 2 lymph nodes involved.  Current PSA is followed by Dr. [name]. Other medical problems include sleep apnea, minor coronary artery disease, hypertension, hypertriglyceridemia, cholelithiasis, colon polyps, cirrhosis by biopsy, diabetes, [name], history of nephrolithiasis, status post right renal surgery and history of left rotator cuff repair under [name].    PAST SURGICAL HISTORY: Past Surgical History  Procedure Laterality Date  . Prostatectomy    . Kidney stone surgery    . Rotator cuff repair    . Ankle surgery    . Cardiac catheterization  09/16/96    Normal LV systolic function,dense ca+ prox. portion of the LAD w/50% narrowing in the distal portion, 30-40% irreg. in the proximal portion & 80% narrowing in the ostial portion of the posterolateral branch.  . Colonoscopy      Hx: of  . Infusion port  04/04/2013    RIGHT SUBCLAVIAN  . Cholecystectomy  04/04/2013  . Cholecystectomy N/A 04/04/2013    Procedure: LAPAROSCOPIC CHOLECYSTECTOMY WITH INTRAOPERATIVE CHOLANGIOGRAM;  [name], MD;  Location: [facility];  Service: General;  [name];  . Portacath placement N/A 04/04/2013    Procedure: INSERTION PORT-A-CATH;  [name], MD;   Location: [facility];  Service: General;  [name];  . Cystoscopy with stent placement Bilateral 02/05/2015    Procedure: CYSTOSCOPY RETROGRADE AND BILATERAL  STENT PLACEMENT;  Surgeon: Kathie Rhodes, MD;  Location: [facility];  Service: Urology;  Laterality: Bilateral;  . Transurethral resection of bladder tumor with gyrus (turbt-gyrus) N/A 02/05/2015    Procedure: TRANSURETHRAL RESECTION OF BLADDER TUMOR  ;  Surgeon: Kathie Rhodes, MD;  Location: [facility];  Service: Urology;  [name];  . Cystoscopy w/ ureteral stent placement Bilateral 06/01/2015    Procedure: CYSTOSCOPY WITH BILATERAL STENT REPLACEMENT;  Surgeon: Raynelle Bring, MD;  Location: [facility];  Service: Urology;  Laterality: Bilateral;    FAMILY HISTORY Family History  Problem Relation Age of Onset  . Heart disease Father   . Heart attack Father   . Cancer Sister   . Heart attack Brother   . Heart attack Brother   . Heart attack Brother   . Heart attack  Brother   . Heart attack Brother   . Cancer Brother   . Cancer Brother   The patient's father died at the age of 16 from an MI.  The patient's mother died from "old age" at 78.  The patient is one of nine siblings.  One brother died from cancer of the esophagus, 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, [name], a homemaker, died in 07/01/12: [name] is an Scientist, physiological for [employer]; [name], [name]; and [name] is Environmental consultant.  Everybody lives in [place].  The patient has five grandchildren.  He is a member of [place]. [place].    ADVANCED DIRECTIVES: in place   HEALTH MAINTENANCE: Social History  Substance Use Topics  . Smoking  status: Former Smoker    Types: Pipe, Landscape architect  . Smokeless tobacco: Former Systems developer    Types: Chew     Comment: [place] YEARS AGO "  . Alcohol Use: No     Colonoscopy:  PSA: Followed by Dr. [name]  Bone density:  Lipid panel:  Allergies  Allergen Reactions  . Atenolol Other (See Comments)    Slowed heart rate.   . Niacin Other (See Comments)    headaches    Current Outpatient Prescriptions  Medication Sig Dispense Refill  . allopurinol (ZYLOPRIM) 300 MG tablet Take 1 tablet (300 mg total) by mouth daily. 90 tablet 0  . amLODipine (NORVASC) 5 MG tablet Take 2.5 mg by mouth daily.     Marland Kitchen atorvastatin (LIPITOR) 20 MG tablet Take 20 mg by mouth at bedtime.    . BD PEN NEEDLE NANO U/F 32G X 4 MM MISC AS DIRECTED ONCE DAILY SQ 90 DAYS  3  . Calcium-Magnesium-Vitamin D (CALCIUM 1200+D3 PO) Take 2 tablets by mouth daily.    . cetirizine (ZYRTEC) 10 MG tablet Take 10 mg by mouth daily.    . cholestyramine (QUESTRAN) 4 G packet Take 1 packet (4 g total) by mouth 3 (three) times daily with meals. (Patient taking differently: Take 4 g by mouth 3 (three) times daily as needed (Diarrhea). ) 60 each 12  . Cyanocobalamin (VITAMIN B 12 PO) Take 2,500 mg by mouth daily.     . fenofibrate 160 MG tablet Take 160 mg by mouth at bedtime.    . gabapentin (NEURONTIN) 300 MG capsule Take 300 mg by mouth 3 (three) times daily.     Marland Kitchen lidocaine-prilocaine (EMLA) cream Apply 1 application topically as needed. (Patient taking differently: Apply 1 application topically daily as needed (port numbing). ) 30 g 3  . magnesium oxide (MAG-OX) 400 (241.3 MG) MG tablet Take 1 tablet by mouth 2 (two) times daily.  4  . metFORMIN (GLUCOPHAGE) 1000 MG tablet Take 1,000 mg by mouth 2 (two) times daily with a meal.     . Multiple Vitamins-Minerals (MULTIVITAMIN WITH MINERALS) tablet Take 1 tablet by mouth daily.    . Rivaroxaban (XARELTO) 15 MG TABS tablet Take 15 mg by mouth every evening.     . sertraline (ZOLOFT)  100 MG tablet Take 100 mg by mouth daily.    . TOUJEO SOLOSTAR 300 UNIT/ML SOPN INJECT 60 UNITS SUB-Q  ONCE DAILY  5  . vitamin C (ASCORBIC ACID) 500 MG tablet Take 500 mg by mouth daily.     No current facility-administered medications for this visit.   Facility-Administered Medications Ordered in Other Visits  Medication Dose Route Frequency Provider Last Rate Last Dose  . sodium chloride flush (NS) 0.9 % injection 10 mL  10 mL Intravenous PRN [name], MD   10 mL at 06/18/15 0254    Objective: Older white man who appears stated age 29 Vitals:   06/18/15 0844  BP: 120/47  Pulse: 71  Temp: 98.2 F (36.8 C)  Resp: 17     Body mass index is 27.76 kg/(m^2).      ECOG FS:1 - Symptomatic but completely ambulatory  Sclerae unicteric, pupils round and equal Oropharynx clear and moist-- no thrush or other lesions No cervical or supraclavicular adenopathy , no axillary or inguinal adenopathy Lungs no rales or rhonchi Heart regular rate and rhythm Abd soft, nontender, positive bowel sounds MSK no focal spinal tenderness, no upper extremity lymphedema Neuro: nonfocal, well oriented, appropriate affect   LAB RESULTS:  CMP     Component Value Date/Time   NA 136 06/05/2015 1012   NA 139 05/28/2015 0940   K 4.1 06/05/2015 1012   K 4.2 05/28/2015 0940   CL 105 05/28/2015 0940   CO2 22 06/05/2015 1012   CO2 24 05/28/2015 0940   GLUCOSE 177* 06/05/2015 1012   GLUCOSE 235* 05/28/2015 0940   BUN 30.9* 06/05/2015 1012   BUN 29* 05/28/2015 0940   CREATININE 1.8* 06/05/2015 1012   CREATININE 1.91* 05/28/2015 0940   CALCIUM 10.1 06/05/2015 1012   CALCIUM 9.7 05/28/2015 0940   CALCIUM 11.0* 08/17/2005 0842   PROT 7.3 06/05/2015 1012   PROT 6.3* 02/05/2015 0448   ALBUMIN 4.3 06/05/2015 1012   ALBUMIN 3.4* 02/05/2015 0448   AST 41* 06/05/2015 1012   AST 32 02/05/2015 0448   ALT 48 06/05/2015 1012   ALT 28 02/05/2015 0448   ALKPHOS 69 06/05/2015 1012   ALKPHOS 52  02/05/2015 0448   BILITOT 0.44 06/05/2015 1012   BILITOT 0.6 02/05/2015 0448   GFRNONAA 31* 05/28/2015 0940   GFRAA 36* 05/28/2015 0940    No results found for: SPEP  Lab Results  Component Value Date   WBC 5.1 06/18/2015   NEUTROABS 4.1 06/18/2015   HGB 10.9* 06/18/2015   HCT 32.4* 06/18/2015   MCV 86.3 06/18/2015   PLT 147 06/18/2015      Chemistry      Component Value Date/Time   NA 136 06/05/2015 1012   NA 139 05/28/2015 0940   K 4.1 06/05/2015 1012   K 4.2 05/28/2015 0940   CL 105 05/28/2015 0940   CO2 22 06/05/2015 1012   CO2 24 05/28/2015 0940   BUN 30.9* 06/05/2015 1012   BUN 29* 05/28/2015 0940   CREATININE 1.8* 06/05/2015 1012   CREATININE 1.91* 05/28/2015 0940      Component Value Date/Time   CALCIUM 10.1 06/05/2015 1012   CALCIUM 9.7 05/28/2015 0940   CALCIUM 11.0* 08/17/2005 0842   ALKPHOS 69 06/05/2015 1012   ALKPHOS 52 02/05/2015 0448   AST 41* 06/05/2015 1012   AST 32 02/05/2015 0448   ALT 48 06/05/2015 1012   ALT 28 02/05/2015 0448   BILITOT 0.44 06/05/2015 1012   BILITOT 0.6 02/05/2015 0448      Urinalysis    Component Value Date/Time   COLORURINE YELLOW 02/04/2015 1649  APPEARANCEUR TURBID* 02/04/2015 1649   LABSPEC 1.020 04/24/2015 0846   LABSPEC 1.017 02/04/2015 1649   PHURINE 6.0 04/24/2015 0846   PHURINE 5.5 02/04/2015 1649   GLUCOSEU 250 04/24/2015 0846   GLUCOSEU 250* 02/04/2015 1649   HGBUR Large 04/24/2015 0846   HGBUR LARGE* 02/04/2015 1649   BILIRUBINUR Negative 04/24/2015 0846   BILIRUBINUR NEGATIVE 02/04/2015 1649   KETONESUR Negative 04/24/2015 [place] 02/04/2015 1649   PROTEINUR 30 04/24/2015 0846   PROTEINUR 100* 02/04/2015 1649   UROBILINOGEN 0.2 04/24/2015 0846   UROBILINOGEN 0.2 02/04/2015 1649   NITRITE Negative 04/24/2015 0846   NITRITE POSITIVE* 02/04/2015 1649   LEUKOCYTESUR Negative 04/24/2015 0846   LEUKOCYTESUR LARGE* 02/04/2015 1649    STUDIES: No results  found.   ASSESSMENT:  82 y.o. patient with a diagnosis of    #1 Primary hepatic non-Hodgkin's lymphoma established through liver biopsy February 2006, treated with Rituxan x9 and then CHOP x6.  All chemotherapy completed in 2007.  Maintenance Rituxan discontinued October 2008.     #2  Biopsy proven retroperitoneal recurrence documented November 2014. Bone marrow biopsy 04/01/2013 was negative   #3 status post  laparoscopic cholecystectomy and port placement on 04/04/2013.  #4 Cycle #1  RICE chemotherapy  completed on 05/02/2013.   #5 cycle #2 RICE chemotherapy  on 05/17/2013  #6 PET scan performed on 05/23/2013 revealed marked partial response to chemotherapy with a retroperitoneal mass decrease in metabolic activity from SUV of 13.2 to 6.3. Right adrenal mass size and metabolic activity is resolved when compared to the previous PET scan.   #7 Evaluated at [facility] by Dr. Tomasa Hosteller for autologous transplant on 05/27/2013 and was quoted a 3% chance of significant heart damage and possibly death from the treatment and a 50% chance of being alive 5 years from now after transplant (versus perhaps 2 years without). After much thought the patient and family have decided firmly they do not wish to proceed to stem cell transplant.  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 treated with vancomycin IV x14 days completed 07/02/2013, followed by doxycycline for 2 additional weeks  #10 started maintenance Rituxan May 2015 (1 dose every 8 weeks)  #11 A-fib, on rivaroxaban  #12 prostate cancer, stage IV-- per Dr [name]  (a) obstructive uropathy secondary to bladder mass (Gleason 9), s/p  L stenting 02/05/2015  PLAN: [name] continues to do well as far as his lymphoma is concerned, with no evidence of disease activity at present. He is tolerating the rituximab without any side effects.   the plan as far as the lymphoma is concern is to  continue the right rituximab every 8 weeks indefinitely. He will see [place] again in 8 weeks and again 16 weeks from now.  He has a mild to moderate anemia doubtless secondary to renal insufficiency and "anemia of chronic illness". I don't feel this needs any intervention unless the hemoglobin drops repeatedly below 10.    his prostate cancer is taken care of through Dr. [name]. I am not getting copies of his clinic notes but we will request those to optimize communication again due to the nature   [name], MD   06/18/2015

## 2015-06-18 NOTE — Patient Instructions (Signed)

## 2015-06-26 DIAGNOSIS — L820 Inflamed seborrheic keratosis: Secondary | ICD-10-CM | POA: Diagnosis not present

## 2015-06-26 DIAGNOSIS — Z85828 Personal history of other malignant neoplasm of skin: Secondary | ICD-10-CM | POA: Diagnosis not present

## 2015-06-26 DIAGNOSIS — L570 Actinic keratosis: Secondary | ICD-10-CM | POA: Diagnosis not present

## 2015-06-29 ENCOUNTER — Ambulatory Visit: Payer: Medicare Other

## 2015-06-29 ENCOUNTER — Other Ambulatory Visit: Payer: Medicare Other

## 2015-07-02 DIAGNOSIS — E119 Type 2 diabetes mellitus without complications: Secondary | ICD-10-CM | POA: Diagnosis not present

## 2015-07-03 DIAGNOSIS — L570 Actinic keratosis: Secondary | ICD-10-CM | POA: Diagnosis not present

## 2015-07-06 DIAGNOSIS — E785 Hyperlipidemia, unspecified: Secondary | ICD-10-CM | POA: Diagnosis not present

## 2015-07-06 DIAGNOSIS — E119 Type 2 diabetes mellitus without complications: Secondary | ICD-10-CM | POA: Diagnosis not present

## 2015-07-06 DIAGNOSIS — I10 Essential (primary) hypertension: Secondary | ICD-10-CM | POA: Diagnosis not present

## 2015-07-10 ENCOUNTER — Telehealth: Payer: Self-pay | Admitting: Oncology

## 2015-07-10 NOTE — Telephone Encounter (Signed)
returned call and advised pt to call back to r/s appt

## 2015-07-10 NOTE — Telephone Encounter (Signed)
Received call to r/s April 14 appt due to a family function pt will be unavailable to make visit. Patient daughter [name]  wants to r/s visit to April 28 being that it will be hard for anyone to bring him on any other day this month. Left vm for desk nurse to advise if/when to r/s. Contact for [name] is [phone]

## 2015-07-14 NOTE — Telephone Encounter (Signed)
Per HB on 3/21 patient will have lab/flush/rx on 4/28 aand have lab/flush/ov/rx with GM on 6/22. Spoke with [name] and she is aware of dates and times.

## 2015-07-27 ENCOUNTER — Other Ambulatory Visit: Payer: Medicare Other

## 2015-07-27 ENCOUNTER — Ambulatory Visit: Payer: Medicare Other

## 2015-08-07 ENCOUNTER — Ambulatory Visit: Payer: Medicare Other | Admitting: Nurse Practitioner

## 2015-08-07 ENCOUNTER — Ambulatory Visit: Payer: Medicare Other

## 2015-08-07 ENCOUNTER — Other Ambulatory Visit: Payer: Medicare Other

## 2015-08-20 ENCOUNTER — Other Ambulatory Visit: Payer: Self-pay

## 2015-08-20 DIAGNOSIS — Z95828 Presence of other vascular implants and grafts: Secondary | ICD-10-CM | POA: Insufficient documentation

## 2015-08-21 ENCOUNTER — Ambulatory Visit: Payer: Medicare Other

## 2015-08-21 ENCOUNTER — Other Ambulatory Visit (HOSPITAL_BASED_OUTPATIENT_CLINIC_OR_DEPARTMENT_OTHER): Payer: Medicare Other

## 2015-08-21 ENCOUNTER — Ambulatory Visit (HOSPITAL_BASED_OUTPATIENT_CLINIC_OR_DEPARTMENT_OTHER): Payer: Medicare Other

## 2015-08-21 VITALS — BP 111/51 | HR 61 | Temp 97.6°F | Resp 16

## 2015-08-21 DIAGNOSIS — Z5112 Encounter for antineoplastic immunotherapy: Secondary | ICD-10-CM

## 2015-08-21 DIAGNOSIS — C8599 Non-Hodgkin lymphoma, unspecified, extranodal and solid organ sites: Secondary | ICD-10-CM | POA: Diagnosis not present

## 2015-08-21 DIAGNOSIS — C8203 Follicular lymphoma grade I, intra-abdominal lymph nodes: Secondary | ICD-10-CM

## 2015-08-21 DIAGNOSIS — C8580 Other specified types of non-Hodgkin lymphoma, unspecified site: Secondary | ICD-10-CM

## 2015-08-21 DIAGNOSIS — C8590 Non-Hodgkin lymphoma, unspecified, unspecified site: Secondary | ICD-10-CM

## 2015-08-21 DIAGNOSIS — C61 Malignant neoplasm of prostate: Secondary | ICD-10-CM

## 2015-08-21 DIAGNOSIS — Z95828 Presence of other vascular implants and grafts: Secondary | ICD-10-CM

## 2015-08-21 LAB — COMPREHENSIVE METABOLIC PANEL
ALBUMIN: 3.9 g/dL (ref 3.5–5.0)
ALK PHOS: 55 U/L (ref 40–150)
ALT: 27 U/L (ref 0–55)
AST: 28 U/L (ref 5–34)
Anion Gap: 8 mEq/L (ref 3–11)
BUN: 26.1 mg/dL — AB (ref 7.0–26.0)
CO2: 25 meq/L (ref 22–29)
Calcium: 9.7 mg/dL (ref 8.4–10.4)
Chloride: 105 mEq/L (ref 98–109)
Creatinine: 1.7 mg/dL — ABNORMAL HIGH (ref 0.7–1.3)
EGFR: 37 mL/min/{1.73_m2} — ABNORMAL LOW (ref >90)
GLUCOSE: 147 mg/dL — AB (ref 70–140)
POTASSIUM: 4 meq/L (ref 3.5–5.1)
SODIUM: 138 meq/L (ref 136–145)
Total Bilirubin: 0.49 mg/dL (ref 0.20–1.20)
Total Protein: 6.4 g/dL (ref 6.4–8.3)

## 2015-08-21 LAB — CBC WITH DIFFERENTIAL/PLATELET
BASO%: 0.2 % (ref 0.0–2.0)
Basophils Absolute: 0 10*3/uL (ref 0.0–0.1)
EOS%: 5.2 % (ref 0.0–7.0)
Eosinophils Absolute: 0.3 10*3/uL (ref 0.0–0.5)
HCT: 29.4 % — ABNORMAL LOW (ref 38.4–49.9)
HGB: 9.7 g/dL — ABNORMAL LOW (ref 13.0–17.1)
LYMPH%: 7.7 % — AB (ref 14.0–49.0)
MCH: 28.3 pg (ref 27.2–33.4)
MCHC: 33 g/dL (ref 32.0–36.0)
MCV: 85.7 fL (ref 79.3–98.0)
MONO#: 0.3 10*3/uL (ref 0.1–0.9)
MONO%: 4.2 % (ref 0.0–14.0)
NEUT%: 82.7 % — ABNORMAL HIGH (ref 39.0–75.0)
NEUTROS ABS: 5 10*3/uL (ref 1.5–6.5)
PLATELETS: 118 10*3/uL — AB (ref 140–400)
RBC: 3.43 10*6/uL — AB (ref 4.20–5.82)
RDW: 15.5 % — ABNORMAL HIGH (ref 11.0–14.6)
WBC: 6 10*3/uL (ref 4.0–10.3)
lymph#: 0.5 10*3/uL — ABNORMAL LOW (ref 0.9–3.3)

## 2015-08-21 MED ORDER — HEPARIN SOD (PORK) LOCK FLUSH 100 UNIT/ML IV SOLN
500.0000 [IU] | Freq: Once | INTRAVENOUS | Status: AC | PRN
  Administered 2015-08-21: 500 [IU]
  Filled 2015-08-21: qty 5

## 2015-08-21 MED ORDER — SODIUM CHLORIDE 0.9 % IV SOLN
Freq: Once | INTRAVENOUS | Status: AC
  Administered 2015-08-21: 10:00:00 via INTRAVENOUS

## 2015-08-21 MED ORDER — ACETAMINOPHEN 325 MG PO TABS
ORAL_TABLET | ORAL | Status: AC
  Filled 2015-08-21: qty 2

## 2015-08-21 MED ORDER — DIPHENHYDRAMINE HCL 25 MG PO CAPS
25.0000 mg | ORAL_CAPSULE | Freq: Once | ORAL | Status: AC
  Administered 2015-08-21: 25 mg via ORAL

## 2015-08-21 MED ORDER — DIPHENHYDRAMINE HCL 25 MG PO CAPS
ORAL_CAPSULE | ORAL | Status: AC
  Filled 2015-08-21: qty 1

## 2015-08-21 MED ORDER — SODIUM CHLORIDE 0.9 % IJ SOLN
10.0000 mL | INTRAMUSCULAR | Status: DC | PRN
  Administered 2015-08-21: 10 mL via INTRAVENOUS
  Filled 2015-08-21: qty 10

## 2015-08-21 MED ORDER — ACETAMINOPHEN 325 MG PO TABS
650.0000 mg | ORAL_TABLET | Freq: Once | ORAL | Status: AC
  Administered 2015-08-21: 650 mg via ORAL

## 2015-08-21 MED ORDER — SODIUM CHLORIDE 0.9 % IJ SOLN
10.0000 mL | INTRAMUSCULAR | Status: DC | PRN
  Administered 2015-08-21: 10 mL
  Filled 2015-08-21: qty 10

## 2015-08-21 MED ORDER — SODIUM CHLORIDE 0.9 % IV SOLN
375.0000 mg/m2 | Freq: Once | INTRAVENOUS | Status: AC
  Administered 2015-08-21: 800 mg via INTRAVENOUS
  Filled 2015-08-21: qty 70

## 2015-08-21 NOTE — Patient Instructions (Signed)

## 2015-08-21 NOTE — Patient Instructions (Signed)
Cancer Center Discharge Instructions for Patients Receiving Chemotherapy  Today you received the following chemotherapy agents: Rituxan   To help prevent nausea and vomiting after your treatment, we encourage you to take your nausea medication as directed.    If you develop nausea and vomiting that is not controlled by your nausea medication, call the clinic.   BELOW ARE SYMPTOMS THAT SHOULD BE REPORTED IMMEDIATELY:  *FEVER GREATER THAN 100.5 F  *CHILLS WITH OR WITHOUT FEVER  NAUSEA AND VOMITING THAT IS NOT CONTROLLED WITH YOUR NAUSEA MEDICATION  *UNUSUAL [name]  *UNUSUAL BRUISING OR BLEEDING  TENDERNESS IN MOUTH AND THROAT WITH OR WITHOUT PRESENCE OF ULCERS  *URINARY PROBLEMS  *BOWEL PROBLEMS  UNUSUAL RASH Items with * indicate a potential emergency and should be followed up as soon as possible.  Feel free to call the clinic you have any questions or concerns. The clinic phone number is [phone].  Please show the CHEMO ALERT CARD at check-in to the Emergency Department and triage nurse.   

## 2015-08-24 ENCOUNTER — Other Ambulatory Visit: Payer: Self-pay | Admitting: Oncology

## 2015-08-24 ENCOUNTER — Telehealth: Payer: Self-pay | Admitting: Oncology

## 2015-08-24 DIAGNOSIS — C8338 Diffuse large B-cell lymphoma, lymph nodes of multiple sites: Secondary | ICD-10-CM

## 2015-08-24 NOTE — Telephone Encounter (Signed)
Spoke with with patienr daughter to confirl May appt date/times per GM 5/1 pof

## 2015-08-24 NOTE — Progress Notes (Unsigned)
AC  I CALLED AND DISCUSSED HIS PROGRESSIVE ANEMIA WITH HIS DAUGHTER. THIS IS GOING TO BE DUE TO HIS RENAL FUNCTION PROBLEMS, WITH A EGFR  IMAGES 35 ML RANGE. I THINK HE WOULD BENEFIT FROM ARANESP. WE HAVE DISCUSSED THIS AT LENGTH IN THE PAST AND HE UNDERSTANDS THE POSSIBLE TOXICITIES, SIDE EFFECTS AND COMPLICATIONS OF THIS AGENT. HE WILL RECEIVE HIS FIRST DOSE MAY 5 AND THEN HIS NEXT DOSE MAY 26. HIS NEXT DOSE WILL BE WITH HIS NEXT RITUXIMAB DOSE June 22.

## 2015-08-28 ENCOUNTER — Ambulatory Visit: Payer: Medicare Other

## 2015-08-28 ENCOUNTER — Ambulatory Visit (HOSPITAL_BASED_OUTPATIENT_CLINIC_OR_DEPARTMENT_OTHER): Payer: Medicare Other

## 2015-08-28 ENCOUNTER — Other Ambulatory Visit (HOSPITAL_BASED_OUTPATIENT_CLINIC_OR_DEPARTMENT_OTHER): Payer: Medicare Other

## 2015-08-28 DIAGNOSIS — N183 Chronic kidney disease, stage 3 unspecified: Secondary | ICD-10-CM

## 2015-08-28 DIAGNOSIS — C8580 Other specified types of non-Hodgkin lymphoma, unspecified site: Secondary | ICD-10-CM

## 2015-08-28 DIAGNOSIS — C61 Malignant neoplasm of prostate: Secondary | ICD-10-CM

## 2015-08-28 DIAGNOSIS — C8599 Non-Hodgkin lymphoma, unspecified, extranodal and solid organ sites: Secondary | ICD-10-CM

## 2015-08-28 DIAGNOSIS — C8338 Diffuse large B-cell lymphoma, lymph nodes of multiple sites: Secondary | ICD-10-CM

## 2015-08-28 DIAGNOSIS — D631 Anemia in chronic kidney disease: Secondary | ICD-10-CM | POA: Diagnosis not present

## 2015-08-28 DIAGNOSIS — Z95828 Presence of other vascular implants and grafts: Secondary | ICD-10-CM

## 2015-08-28 DIAGNOSIS — C8590 Non-Hodgkin lymphoma, unspecified, unspecified site: Secondary | ICD-10-CM

## 2015-08-28 LAB — FERRITIN: Ferritin: 131 ng/ml (ref 22–316)

## 2015-08-28 LAB — CBC & DIFF AND RETIC
BASO%: 0.2 % (ref 0.0–2.0)
Basophils Absolute: 0 10*3/uL (ref 0.0–0.1)
EOS%: 6 % (ref 0.0–7.0)
Eosinophils Absolute: 0.3 10*3/uL (ref 0.0–0.5)
HCT: 31 % — ABNORMAL LOW (ref 38.4–49.9)
HGB: 10.3 g/dL — ABNORMAL LOW (ref 13.0–17.1)
IMMATURE RETIC FRACT: 10.2 % (ref 3.00–10.60)
LYMPH%: 5.2 % — AB (ref 14.0–49.0)
MCH: 28.2 pg (ref 27.2–33.4)
MCHC: 33.2 g/dL (ref 32.0–36.0)
MCV: 84.9 fL (ref 79.3–98.0)
MONO#: 0.5 10*3/uL (ref 0.1–0.9)
MONO%: 9.4 % (ref 0.0–14.0)
NEUT%: 79.2 % — ABNORMAL HIGH (ref 39.0–75.0)
NEUTROS ABS: 4.5 10*3/uL (ref 1.5–6.5)
Platelets: 128 10*3/uL — ABNORMAL LOW (ref 140–400)
RBC: 3.65 10*6/uL — ABNORMAL LOW (ref 4.20–5.82)
RDW: 15.9 % — ABNORMAL HIGH (ref 11.0–14.6)
Retic %: 2.25 % — ABNORMAL HIGH (ref 0.80–1.80)
Retic Ct Abs: 82.13 10*3/uL (ref 34.80–93.90)
WBC: 5.6 10*3/uL (ref 4.0–10.3)
lymph#: 0.3 10*3/uL — ABNORMAL LOW (ref 0.9–3.3)

## 2015-08-28 LAB — COMPREHENSIVE METABOLIC PANEL
ALT: 28 U/L (ref 0–55)
ANION GAP: 10 meq/L (ref 3–11)
AST: 31 U/L (ref 5–34)
Albumin: 4.1 g/dL (ref 3.5–5.0)
Alkaline Phosphatase: 63 U/L (ref 40–150)
BILIRUBIN TOTAL: 0.44 mg/dL (ref 0.20–1.20)
BUN: 35.4 mg/dL — ABNORMAL HIGH (ref 7.0–26.0)
CHLORIDE: 104 meq/L (ref 98–109)
CO2: 23 mEq/L (ref 22–29)
Calcium: 10.2 mg/dL (ref 8.4–10.4)
Creatinine: 2 mg/dL — ABNORMAL HIGH (ref 0.7–1.3)
EGFR: 31 mL/min/{1.73_m2} — AB (ref >90)
GLUCOSE: 215 mg/dL — AB (ref 70–140)
Potassium: 4.1 mEq/L (ref 3.5–5.1)
SODIUM: 137 meq/L (ref 136–145)
TOTAL PROTEIN: 7 g/dL (ref 6.4–8.3)

## 2015-08-28 MED ORDER — HEPARIN SOD (PORK) LOCK FLUSH 100 UNIT/ML IV SOLN
500.0000 [IU] | Freq: Once | INTRAVENOUS | Status: AC | PRN
  Administered 2015-08-28: 500 [IU] via INTRAVENOUS
  Filled 2015-08-28: qty 5

## 2015-08-28 MED ORDER — DARBEPOETIN ALFA 300 MCG/0.6ML IJ SOSY
300.0000 ug | PREFILLED_SYRINGE | Freq: Once | INTRAMUSCULAR | Status: AC
  Administered 2015-08-28: 300 ug via SUBCUTANEOUS
  Filled 2015-08-28: qty 0.6

## 2015-08-28 MED ORDER — SODIUM CHLORIDE 0.9 % IJ SOLN
10.0000 mL | INTRAMUSCULAR | Status: DC | PRN
  Administered 2015-08-28: 10 mL via INTRAVENOUS
  Filled 2015-08-28: qty 10

## 2015-08-28 NOTE — Progress Notes (Signed)
Per lab, Hgb 10.3.

## 2015-08-28 NOTE — Patient Instructions (Signed)
Darbepoetin Alfa injection What is this medicine? DARBEPOETIN ALFA (dar be POE e tin AL fa) helps your body make more red blood cells. It is used to treat anemia caused by chronic kidney failure and chemotherapy. This medicine may be used for other purposes; ask your health care provider or pharmacist if you have questions. What should I tell my health care provider before I take this medicine? They need to know if you have any of these conditions: -blood clotting disorders or history of blood clots -cancer patient not on chemotherapy -cystic fibrosis -heart disease, such as angina, heart failure, or a history of a heart attack -hemoglobin level of 12 g/dL or greater -high blood pressure -low levels of folate, iron, or vitamin B12 -seizures -an unusual or allergic reaction to darbepoetin, erythropoietin, albumin, hamster proteins, latex, other medicines, foods, dyes, or preservatives -pregnant or trying to get pregnant -breast-feeding How should I use this medicine? This medicine is for injection into a vein or under the skin. It is usually given by a health care professional in a hospital or clinic setting. If you get this medicine at home, you will be taught how to prepare and give this medicine. Do not shake the solution before you withdraw a dose. Use exactly as directed. Take your medicine at regular intervals. Do not take your medicine more often than directed. It is important that you put your used needles and syringes in a special sharps container. Do not put them in a trash can. If you do not have a sharps container, call your pharmacist or healthcare provider to get one. Talk to your pediatrician regarding the use of this medicine in children. While this medicine may be used in children as young as 1 year for selected conditions, precautions do apply. Overdosage: If you think you have taken too much of this medicine contact a poison control center or emergency room at once. NOTE:  This medicine is only for you. Do not share this medicine with others. What if I miss a dose? If you miss a dose, take it as soon as you can. If it is almost time for your next dose, take only that dose. Do not take double or extra doses. What may interact with this medicine? Do not take this medicine with any of the following medications: -epoetin alfa This list may not describe all possible interactions. Give your health care provider a list of all the medicines, herbs, non-prescription drugs, or dietary supplements you use. Also tell them if you smoke, drink alcohol, or use illegal drugs. Some items may interact with your medicine. What should I watch for while using this medicine? Visit your prescriber or health care professional for regular checks on your progress and for the needed blood tests and blood pressure measurements. It is especially important for the doctor to make sure your hemoglobin level is in the desired range, to limit the risk of potential side effects and to give you the best benefit. Keep all appointments for any recommended tests. Check your blood pressure as directed. Ask your doctor what your blood pressure should be and when you should contact him or her. As your body makes more red blood cells, you may need to take iron, folic acid, or vitamin B supplements. Ask your doctor or health care provider which products are right for you. If you have kidney disease continue dietary restrictions, even though this medication can make you feel better. Talk with your doctor or health care professional about the   foods you eat and the vitamins that you take. What side effects may I notice from receiving this medicine? Side effects that you should report to your doctor or health care professional as soon as possible: -allergic reactions like skin rash, itching or hives, swelling of the face, lips, or tongue -breathing problems -changes in vision -chest pain -confusion, trouble speaking  or understanding -feeling faint or lightheaded, falls -high blood pressure -muscle aches or pains -pain, swelling, warmth in the leg -rapid weight gain -severe headaches -sudden numbness or weakness of the face, arm or leg -trouble walking, dizziness, loss of balance or coordination -seizures (convulsions) -swelling of the ankles, feet, hands -unusually weak or tired Side effects that usually do not require medical attention (report to your doctor or health care professional if they continue or are bothersome): -diarrhea -fever, chills (flu-like symptoms) -headaches -nausea, vomiting -redness, stinging, or swelling at site where injected This list may not describe all possible side effects. Call your doctor for medical advice about side effects. You may report side effects to FDA at 1-800-FDA-1088. Where should I keep my medicine? Keep out of the reach of children. Store in a refrigerator between 2 and 8 degrees C (36 and 46 degrees F). Do not freeze. Do not shake. Throw away any unused portion if using a single-dose vial. Throw away any unused medicine after the expiration date. NOTE: This sheet is a summary. It may not cover all possible information. If you have questions about this medicine, talk to your doctor, pharmacist, or health care provider.    2016, Elsevier/Gold Standard. (2008-03-25 10:23:57)  

## 2015-09-02 DIAGNOSIS — Z0000 Encounter for general adult medical examination without abnormal findings: Secondary | ICD-10-CM | POA: Diagnosis not present

## 2015-09-02 DIAGNOSIS — N135 Crossing vessel and stricture of ureter without hydronephrosis: Secondary | ICD-10-CM | POA: Diagnosis not present

## 2015-09-02 DIAGNOSIS — C61 Malignant neoplasm of prostate: Secondary | ICD-10-CM | POA: Diagnosis not present

## 2015-09-18 ENCOUNTER — Ambulatory Visit: Payer: Medicare Other

## 2015-09-18 ENCOUNTER — Other Ambulatory Visit (HOSPITAL_BASED_OUTPATIENT_CLINIC_OR_DEPARTMENT_OTHER): Payer: Medicare Other

## 2015-09-18 ENCOUNTER — Ambulatory Visit (HOSPITAL_BASED_OUTPATIENT_CLINIC_OR_DEPARTMENT_OTHER): Payer: Medicare Other

## 2015-09-18 DIAGNOSIS — Z95828 Presence of other vascular implants and grafts: Secondary | ICD-10-CM

## 2015-09-18 DIAGNOSIS — C8580 Other specified types of non-Hodgkin lymphoma, unspecified site: Secondary | ICD-10-CM

## 2015-09-18 DIAGNOSIS — C8590 Non-Hodgkin lymphoma, unspecified, unspecified site: Secondary | ICD-10-CM

## 2015-09-18 DIAGNOSIS — N183 Chronic kidney disease, stage 3 unspecified: Secondary | ICD-10-CM

## 2015-09-18 DIAGNOSIS — D630 Anemia in neoplastic disease: Secondary | ICD-10-CM

## 2015-09-18 DIAGNOSIS — C61 Malignant neoplasm of prostate: Secondary | ICD-10-CM

## 2015-09-18 DIAGNOSIS — C8599 Non-Hodgkin lymphoma, unspecified, extranodal and solid organ sites: Secondary | ICD-10-CM

## 2015-09-18 LAB — COMPREHENSIVE METABOLIC PANEL
ALBUMIN: 4.2 g/dL (ref 3.5–5.0)
ALK PHOS: 56 U/L (ref 40–150)
ALT: 25 U/L (ref 0–55)
AST: 30 U/L (ref 5–34)
Anion Gap: 12 mEq/L — ABNORMAL HIGH (ref 3–11)
BILIRUBIN TOTAL: 0.47 mg/dL (ref 0.20–1.20)
BUN: 27.8 mg/dL — AB (ref 7.0–26.0)
CALCIUM: 10 mg/dL (ref 8.4–10.4)
CO2: 18 mEq/L — ABNORMAL LOW (ref 22–29)
CREATININE: 1.7 mg/dL — AB (ref 0.7–1.3)
Chloride: 105 mEq/L (ref 98–109)
EGFR: 37 mL/min/{1.73_m2} — ABNORMAL LOW (ref >90)
Glucose: 154 mg/dl — ABNORMAL HIGH (ref 70–140)
Potassium: 4.4 mEq/L (ref 3.5–5.1)
Sodium: 135 mEq/L — ABNORMAL LOW (ref 136–145)
Total Protein: 6.9 g/dL (ref 6.4–8.3)

## 2015-09-18 LAB — CBC WITH DIFFERENTIAL/PLATELET
BASO%: 0.8 % (ref 0.0–2.0)
BASOS ABS: 0 10*3/uL (ref 0.0–0.1)
EOS%: 3 % (ref 0.0–7.0)
Eosinophils Absolute: 0.2 10*3/uL (ref 0.0–0.5)
HEMATOCRIT: 35.7 % — AB (ref 38.4–49.9)
HEMOGLOBIN: 11.9 g/dL — AB (ref 13.0–17.1)
LYMPH#: 0.3 10*3/uL — AB (ref 0.9–3.3)
LYMPH%: 4.7 % — ABNORMAL LOW (ref 14.0–49.0)
MCH: 28 pg (ref 27.2–33.4)
MCHC: 33.3 g/dL (ref 32.0–36.0)
MCV: 84.3 fL (ref 79.3–98.0)
MONO#: 0.5 10*3/uL (ref 0.1–0.9)
MONO%: 8.5 % (ref 0.0–14.0)
NEUT#: 4.8 10*3/uL (ref 1.5–6.5)
NEUT%: 83 % — ABNORMAL HIGH (ref 39.0–75.0)
PLATELETS: 188 10*3/uL (ref 140–400)
RBC: 4.24 10*6/uL (ref 4.20–5.82)
RDW: 16.7 % — AB (ref 11.0–14.6)
WBC: 5.8 10*3/uL (ref 4.0–10.3)

## 2015-09-18 MED ORDER — SODIUM CHLORIDE 0.9 % IJ SOLN
10.0000 mL | INTRAMUSCULAR | Status: DC | PRN
Start: 2015-09-18 — End: 2015-09-18
  Administered 2015-09-18: 10 mL via INTRAVENOUS
  Filled 2015-09-18: qty 10

## 2015-09-18 MED ORDER — DARBEPOETIN ALFA 300 MCG/0.6ML IJ SOSY: 300.0000 ug | PREFILLED_SYRINGE | Freq: Once | INTRAMUSCULAR | Status: DC

## 2015-09-18 MED ORDER — HEPARIN SOD (PORK) LOCK FLUSH 100 UNIT/ML IV SOLN
500.0000 [IU] | Freq: Once | INTRAVENOUS | Status: AC | PRN
  Administered 2015-09-18: 500 [IU] via INTRAVENOUS
  Filled 2015-09-18: qty 5

## 2015-09-18 NOTE — Progress Notes (Signed)
Hgb = 1109 today, will hold Aranesp; patient is aware.

## 2015-09-18 NOTE — Progress Notes (Signed)
Hgb noted at 11.9 injection held per parameters. Pt given copyy of labs and current schedule. Instructed to call for issues.

## 2015-09-22 ENCOUNTER — Other Ambulatory Visit: Payer: Self-pay | Admitting: Urology

## 2015-10-01 ENCOUNTER — Ambulatory Visit: Payer: Medicare Other | Admitting: Oncology

## 2015-10-01 ENCOUNTER — Other Ambulatory Visit: Payer: Medicare Other

## 2015-10-02 ENCOUNTER — Other Ambulatory Visit: Payer: Medicare Other

## 2015-10-02 ENCOUNTER — Ambulatory Visit: Payer: Medicare Other

## 2015-10-09 DIAGNOSIS — I10 Essential (primary) hypertension: Secondary | ICD-10-CM | POA: Diagnosis not present

## 2015-10-09 DIAGNOSIS — E119 Type 2 diabetes mellitus without complications: Secondary | ICD-10-CM | POA: Diagnosis not present

## 2015-10-13 ENCOUNTER — Other Ambulatory Visit: Payer: Self-pay | Admitting: *Deleted

## 2015-10-13 MED ORDER — AMOXICILLIN 500 MG PO CAPS: 500.0000 mg | ORAL_CAPSULE | Freq: Two times a day (BID) | ORAL | Status: DC

## 2015-10-13 NOTE — Telephone Encounter (Signed)
Call received from pt's dtr and caregiver- [name]- who states pt " has broken a couple of teeth and his mouth is sore and a little swollen ". " daddy has never in his 71 years been to dentist "  [name] is requesting an antibiotic to cover him while she finds a dentist who can see him.  Prescription obtained and sent to pharmacy.   [name] will update this office per above as needed.

## 2015-10-15 ENCOUNTER — Telehealth: Payer: Self-pay | Admitting: Oncology

## 2015-10-15 ENCOUNTER — Ambulatory Visit (HOSPITAL_BASED_OUTPATIENT_CLINIC_OR_DEPARTMENT_OTHER): Payer: Medicare Other | Admitting: Oncology

## 2015-10-15 ENCOUNTER — Ambulatory Visit: Payer: Medicare Other

## 2015-10-15 ENCOUNTER — Other Ambulatory Visit (HOSPITAL_BASED_OUTPATIENT_CLINIC_OR_DEPARTMENT_OTHER): Payer: Medicare Other

## 2015-10-15 VITALS — BP 120/80 | HR 66 | Temp 98.4°F | Resp 17 | Ht 68.0 in | Wt 180.0 lb

## 2015-10-15 DIAGNOSIS — C83398 Diffuse large b-cell lymphoma of other extranodal and solid organ sites: Secondary | ICD-10-CM

## 2015-10-15 DIAGNOSIS — C8580 Other specified types of non-Hodgkin lymphoma, unspecified site: Secondary | ICD-10-CM

## 2015-10-15 DIAGNOSIS — C8593 Non-Hodgkin lymphoma, unspecified, intra-abdominal lymph nodes: Secondary | ICD-10-CM | POA: Diagnosis not present

## 2015-10-15 DIAGNOSIS — C61 Malignant neoplasm of prostate: Secondary | ICD-10-CM | POA: Diagnosis not present

## 2015-10-15 DIAGNOSIS — K1370 Unspecified lesions of oral mucosa: Secondary | ICD-10-CM | POA: Diagnosis not present

## 2015-10-15 DIAGNOSIS — E119 Type 2 diabetes mellitus without complications: Secondary | ICD-10-CM

## 2015-10-15 DIAGNOSIS — I4891 Unspecified atrial fibrillation: Secondary | ICD-10-CM

## 2015-10-15 DIAGNOSIS — Z95828 Presence of other vascular implants and grafts: Secondary | ICD-10-CM

## 2015-10-15 DIAGNOSIS — C8339 Diffuse large B-cell lymphoma, extranodal and solid organ sites: Secondary | ICD-10-CM

## 2015-10-15 DIAGNOSIS — C8590 Non-Hodgkin lymphoma, unspecified, unspecified site: Secondary | ICD-10-CM

## 2015-10-15 DIAGNOSIS — Z7984 Long term (current) use of oral hypoglycemic drugs: Secondary | ICD-10-CM

## 2015-10-15 LAB — COMPREHENSIVE METABOLIC PANEL
ALT: 26 U/L (ref 0–55)
ANION GAP: 9 meq/L (ref 3–11)
AST: 24 U/L (ref 5–34)
Albumin: 3.7 g/dL (ref 3.5–5.0)
Alkaline Phosphatase: 52 U/L (ref 40–150)
BILIRUBIN TOTAL: 0.3 mg/dL (ref 0.20–1.20)
BUN: 25 mg/dL (ref 7.0–26.0)
CALCIUM: 9.6 mg/dL (ref 8.4–10.4)
CHLORIDE: 104 meq/L (ref 98–109)
CO2: 24 meq/L (ref 22–29)
CREATININE: 1.6 mg/dL — AB (ref 0.7–1.3)
EGFR: 40 mL/min/{1.73_m2} — AB (ref >90)
Glucose: 157 mg/dl — ABNORMAL HIGH (ref 70–140)
Potassium: 4.7 mEq/L (ref 3.5–5.1)
Sodium: 137 mEq/L (ref 136–145)
TOTAL PROTEIN: 6.6 g/dL (ref 6.4–8.3)

## 2015-10-15 LAB — CBC WITH DIFFERENTIAL/PLATELET
BASO%: 0.3 % (ref 0.0–2.0)
Basophils Absolute: 0 10*3/uL (ref 0.0–0.1)
EOS ABS: 0.3 10*3/uL (ref 0.0–0.5)
EOS%: 3.3 % (ref 0.0–7.0)
HEMATOCRIT: 31.9 % — AB (ref 38.4–49.9)
HGB: 10.5 g/dL — ABNORMAL LOW (ref 13.0–17.1)
LYMPH#: 0.3 10*3/uL — AB (ref 0.9–3.3)
LYMPH%: 3.3 % — AB (ref 14.0–49.0)
MCH: 27.6 pg (ref 27.2–33.4)
MCHC: 32.9 g/dL (ref 32.0–36.0)
MCV: 83.8 fL (ref 79.3–98.0)
MONO#: 0.5 10*3/uL (ref 0.1–0.9)
MONO%: 6 % (ref 0.0–14.0)
NEUT#: 6.6 10*3/uL — ABNORMAL HIGH (ref 1.5–6.5)
NEUT%: 87.1 % — AB (ref 39.0–75.0)
PLATELETS: 159 10*3/uL (ref 140–400)
RBC: 3.81 10*6/uL — AB (ref 4.20–5.82)
RDW: 16.9 % — ABNORMAL HIGH (ref 11.0–14.6)
WBC: 7.6 10*3/uL (ref 4.0–10.3)

## 2015-10-15 MED ORDER — SODIUM CHLORIDE 0.9 % IJ SOLN
10.0000 mL | INTRAMUSCULAR | Status: DC | PRN
  Administered 2015-10-15: 10 mL via INTRAVENOUS
  Filled 2015-10-15: qty 10

## 2015-10-15 MED ORDER — HEPARIN SOD (PORK) LOCK FLUSH 100 UNIT/ML IV SOLN
500.0000 [IU] | Freq: Once | INTRAVENOUS | Status: AC | PRN
  Administered 2015-10-15: 500 [IU] via INTRAVENOUS
  Filled 2015-10-15: qty 5

## 2015-10-15 NOTE — Patient Instructions (Signed)

## 2015-10-15 NOTE — Progress Notes (Signed)
ID: [name] OB: 09-Dec-1931  MR#: [id number]  ZJQ#:[id number]  PCP: Michael Pel, MD GYN:   SU: [name] OTHER MD: [name], [name],  [name], [name], [name]  CHIEF COMPLAINT: Non-Hodgkin's lymphoma, stage IV prostate cancer  CURRENT TREATMENT: Maintenance rituximab, starting Aranesp, considering Denosumab  NON-HODGKIN"S LYMPHOMA HISTORY: From the prior summary:  The patient developed right upper quadrant pain last year and he had an ultrasound April 5th which showed some gallstones without evidence of cholecystitis or ductal dilatation.  However, there were multiple lesions in the liver which could not be assessed further.  Accordingly on July 31, 2003, [name], showed multiple liver masses, more than 25, most over 1 cm, the largest being in the inferior right lobe, measuring 4.2 cm. There was also a small mass in the spleen.  CT of the pelvis was unremarkable.   The patient had a biopsy of the liver August 01, 2003.  The report says only that it was "a lesion in the right lobe of the liver".  Presumably this was the largest lesion present.  The final pathology [id number] and [id number]) showed only cirrhosis.     The patient has been followed by [name], and a repeat CT scan of the abdomen and pelvis was obtained May 18, 2004.  Many of the liver lesions seen previously had actually decreased in size.  However, [name], [name], a second ultrasound-guided biopsy was performed 06/11/04. This was a lesion deep in the left lobe of the liver and therefore, I would think  not the same one previously biopsied which was in the right liver. The pathology this time [id number]) shows a poorly differentiated neuroendocrine carcinoma which was positive for chromogranin A, negative for synaptophysin, thyroid transcription factor, a variety of cytokeratins, [name], alpha-fetoprotein and COX-2."  [name] was subsequently evaluated at [facility] by Dr. Otelia [name] and repeat liver biopsy and review of the earlier biopsy here showed a primary hepatic lymphoma. The patient was treated with R-CHOP as detailed below and achieved a complete response. He received maintenance rituximab until 2008  His subsequent history is as detailed below  INTERVAL HISTORY. [name] returns today with one of his daughters for follow up of his primary hepatic lymphoma. He continues on rituximabmaintenance, receiving a dose every 8 weeks, with a dose due today.  However she has a significant, infection, with multiple abscesses, and he had to be started on Augmentin. Multiple extractions are planned, in fact he likely will lose all his teeth and will need dentures.in this setting I think it would be prudent to hold off on the rituximab for now.  REVIEW OF SYSTEMS:  [name] continues to work on his large garden, which produces "emotional." Of vegetables. He also continues to enjoy his dog. He had some pain in his left eye but that has resolved. He has had some seasonal allergies. He had a significant pain in the right flank area associated with hematuria but both those problems resolved. He is scheduled for stent exchange through urology early July. Sometimes he finds it  a little difficult to walk. He feels forgetful. He can have occasional headaches. A detailed review of systems was otherwise stable  PAST MEDICAL HISTORY: Past Medical History  Diagnosis Date  . Hypertension   . Cancer of liver ([place])   . Kidney stone   . Prostate cancer ([place])   . Skin cancer   . Arthritis   . Anemia   . GERD  (gastroesophageal reflux disease)   . Lymphoma ([place])   . [name]   . Neuropathy in diabetes ([place])     Hx: of  . Diabetes mellitus     INSULIN DEPENDENT  . HX, PERSONAL, MALIGNANCY, PROSTATE 07/28/2006    Annotation: 2001, resected Qualifier: Diagnosis of  By: Michael Sima MD, [name]    . A-fib ([place])   . Near syncope 10/18/2013  . Memory deficit 10/18/2013  . HOH (hard of hearing)   . Sleep apnea     on CPAP   Significant for prostate cancer, the patient undergoing prostatectomy January 2001 under [name] for a Gleason 7, pathologic T3b (positive seminal vesicle involvement) adenocarcinoma with 0 of 2 lymph nodes involved.  Current PSA is followed by Dr. Alinda [name]. Other medical problems include sleep apnea, minor coronary artery disease, hypertension, hypertriglyceridemia, cholelithiasis, colon polyps, cirrhosis by biopsy, diabetes, squamous cell carcinomas of the skin removed by [name], history of nephrolithiasis, status post right renal surgery and history of left rotator cuff repair under [name].    PAST SURGICAL HISTORY: Past Surgical History  Procedure Laterality Date  . Prostatectomy    . Kidney stone surgery    . Rotator cuff repair    . Ankle surgery    . Cardiac catheterization  09/16/96    Normal LV systolic function,dense ca+ prox. portion of the LAD w/50% narrowing in the distal portion, 30-40% irreg. in the proximal portion & 80% narrowing in the ostial portion of the posterolateral branch.  . Colonoscopy      Hx: of  . Infusion port  04/04/2013    RIGHT SUBCLAVIAN  . Cholecystectomy  04/04/2013  . Cholecystectomy N/A 04/04/2013    Procedure: LAPAROSCOPIC CHOLECYSTECTOMY WITH INTRAOPERATIVE CHOLANGIOGRAM;  Surgeon: Michael Regal, MD;  Location: [place];  Service: General;  [name];  . Portacath placement N/A 04/04/2013    Procedure: INSERTION PORT-A-CATH;  Surgeon: Michael Regal, MD;  Location: [place];  Service: General;  [name];   . Cystoscopy with stent placement Bilateral 02/05/2015    Procedure: CYSTOSCOPY RETROGRADE AND BILATERAL  STENT PLACEMENT;  Surgeon: Kathie Rhodes, MD;  Location: [facility];  Service: Urology;  Laterality: Bilateral;  . Transurethral resection of bladder tumor with gyrus (turbt-gyrus) N/A 02/05/2015    Procedure: TRANSURETHRAL RESECTION OF BLADDER TUMOR  ;  Surgeon: Kathie Rhodes, MD;  Location: [facility];  Service: Urology;  [name];  . Cystoscopy w/ ureteral stent placement Bilateral 06/01/2015    Procedure: CYSTOSCOPY WITH BILATERAL STENT REPLACEMENT;  Surgeon: Raynelle Bring, MD;  Location: [facility];  Service: Urology;  Laterality: Bilateral;    FAMILY HISTORY Family History  Problem Relation Age of Onset  . Heart disease Father   . Heart attack Father   . Cancer Sister   . Heart attack Brother   . Heart attack Brother   . Heart attack Brother   . Heart attack Brother   . Heart attack Brother   . Cancer Brother   . Cancer Brother   The patient's father died at the age of 54  from an MI.  The patient's mother died from "old age" at 70.  The patient is one of nine siblings.  One brother died from cancer of the esophagus, 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, [name], a homemaker, died in May 30, 2012: [name] is an Scientist, physiological for [employer]; [name], [name]; and [name] is Environmental consultant.  Everybody lives in [place].  The patient has five grandchildren.  He is a member of [place].    ADVANCED DIRECTIVES: in place   HEALTH MAINTENANCE: Social History  Substance Use Topics  . Smoking status: Former Smoker    Types: Pipe, Landscape architect  .  Smokeless tobacco: Former Systems developer    Types: Chew     Comment: [place] YEARS AGO "  . Alcohol Use: No     Colonoscopy:  PSA: Followed by Dr. Alinda [name]  Bone density:  Lipid panel:  Allergies  Allergen Reactions  . Atenolol Other (See Comments)    Slowed heart rate.   . Niacin Other (See Comments)    headaches    Current Outpatient Prescriptions  Medication Sig Dispense Refill  . allopurinol (ZYLOPRIM) 300 MG tablet Take 1 tablet (300 mg total) by mouth daily. 90 tablet 0  . amLODipine (NORVASC) 5 MG tablet Take 2.5 mg by mouth daily.     Marland Kitchen amoxicillin (AMOXIL) 500 MG capsule Take 1 capsule (500 mg total) by mouth 2 (two) times daily. 60 capsule 0  . atorvastatin (LIPITOR) 20 MG tablet Take 20 mg by mouth at bedtime.    . BD PEN NEEDLE NANO U/F 32G X 4 MM MISC AS DIRECTED ONCE DAILY SQ 90 DAYS  3  . Calcium-Magnesium-Vitamin D (CALCIUM 1200+D3 PO) Take 2 tablets by mouth daily.    . cetirizine (ZYRTEC) 10 MG tablet Take 10 mg by mouth daily.    . cholestyramine (QUESTRAN) 4 G packet Take 1 packet (4 g total) by mouth 3 (three) times daily with meals. (Patient taking differently: Take 4 g by mouth 3 (three) times daily as needed (Diarrhea). ) 60 each 12  . Cyanocobalamin (VITAMIN B 12 PO) Take 2,500 mg by mouth daily.     . fenofibrate 160 MG tablet Take 160 mg by mouth at bedtime.    . gabapentin (NEURONTIN) 300 MG capsule Take 300 mg by mouth 3 (three) times daily.     Marland Kitchen lidocaine-prilocaine (EMLA) cream Apply 1 application topically as needed. (Patient taking differently: Apply 1 application topically daily as needed (port numbing). ) 30 g 3  . magnesium oxide (MAG-OX) 400 (241.3 MG) MG tablet Take 1 tablet by mouth 2 (two) times daily.  4  . metFORMIN (GLUCOPHAGE) 1000 MG tablet Take 1,000 mg by mouth 2 (two) times daily with a meal.     . Multiple Vitamins-Minerals (MULTIVITAMIN WITH MINERALS) tablet Take 1 tablet by mouth daily.    . Rivaroxaban (XARELTO) 15 MG TABS tablet  Take 15 mg by mouth every evening.     . sertraline (ZOLOFT) 100 MG tablet Take 100 mg by mouth daily.    . TOUJEO SOLOSTAR 300 UNIT/ML SOPN INJECT 60 UNITS SUB-Q ONCE DAILY  5  .  vitamin C (ASCORBIC ACID) 500 MG tablet Take 500 mg by mouth daily.     No current facility-administered medications for this visit.    Objective: Older white man in no acute distress    Filed Vitals:   10/15/15 0945  BP: 120/80  Pulse: 66  Temp: 98.4 F (36.9 C)  Resp: 17     Body mass index is 27.38 kg/(m^2).      ECOG FS:1 - Symptomatic but completely ambulatory  Sclerae unicteric, EOMs intact Oropharynx clear,  poor dentition as previously noted No cervical or supraclavicular adenopathy Lungs no rales or rhonchi Heart regular rate and rhythm Abd soft, nontender, positive bowel sounds, no palpable splenomegaly MSK no focal spinal tenderness, no upper extremity lymphedema Neuro: nonfocal, well oriented, appropriate affect    LAB RESULTS:  CMP     Component Value Date/Time   NA 135* 09/18/2015 0811   NA 139 05/28/2015 0940   K 4.4 09/18/2015 0811   K 4.2 05/28/2015 0940   CL 105 05/28/2015 0940   CO2 18* 09/18/2015 0811   CO2 24 05/28/2015 0940   GLUCOSE 154* 09/18/2015 0811   GLUCOSE 235* 05/28/2015 0940   BUN 27.8* 09/18/2015 0811   BUN 29* 05/28/2015 0940   CREATININE 1.7* 09/18/2015 0811   CREATININE 1.91* 05/28/2015 0940   CALCIUM 10.0 09/18/2015 0811   CALCIUM 9.7 05/28/2015 0940   CALCIUM 11.0* 08/17/2005 0842   PROT 6.9 09/18/2015 0811   PROT 6.3* 02/05/2015 0448   ALBUMIN 4.2 09/18/2015 0811   ALBUMIN 3.4* 02/05/2015 0448   AST 30 09/18/2015 0811   AST 32 02/05/2015 0448   ALT 25 09/18/2015 0811   ALT 28 02/05/2015 0448   ALKPHOS 56 09/18/2015 0811   ALKPHOS 52 02/05/2015 0448   BILITOT 0.47 09/18/2015 0811   BILITOT 0.6 02/05/2015 0448   GFRNONAA 31* 05/28/2015 0940   GFRAA 36* 05/28/2015 0940    No results found for: SPEP  Lab Results  Component Value Date    WBC 7.6 10/15/2015   NEUTROABS 6.6* 10/15/2015   HGB 10.5* 10/15/2015   HCT 31.9* 10/15/2015   MCV 83.8 10/15/2015   PLT 159 10/15/2015      Chemistry      Component Value Date/Time   NA 135* 09/18/2015 0811   NA 139 05/28/2015 0940   K 4.4 09/18/2015 0811   K 4.2 05/28/2015 0940   CL 105 05/28/2015 0940   CO2 18* 09/18/2015 0811   CO2 24 05/28/2015 0940   BUN 27.8* 09/18/2015 0811   BUN 29* 05/28/2015 0940   CREATININE 1.7* 09/18/2015 0811   CREATININE 1.91* 05/28/2015 0940      Component Value Date/Time   CALCIUM 10.0 09/18/2015 0811   CALCIUM 9.7 05/28/2015 0940   CALCIUM 11.0* 08/17/2005 0842   ALKPHOS 56 09/18/2015 0811   ALKPHOS 52 02/05/2015 0448   AST 30 09/18/2015 0811   AST 32 02/05/2015 0448   ALT 25 09/18/2015 0811   ALT 28 02/05/2015 0448   BILITOT 0.47 09/18/2015 0811   BILITOT 0.6 02/05/2015 0448      Urinalysis    Component Value Date/Time   COLORURINE YELLOW 02/04/2015 1649   APPEARANCEUR TURBID* 02/04/2015 1649   LABSPEC 1.020 04/24/2015 0846   LABSPEC 1.017 02/04/2015 1649   PHURINE 6.0 04/24/2015 0846   PHURINE 5.5 02/04/2015 1649   GLUCOSEU 250 04/24/2015 0846   GLUCOSEU 250* 02/04/2015 1649   HGBUR Large 04/24/2015 0846   HGBUR LARGE* 02/04/2015 1649  BILIRUBINUR Negative 04/24/2015 0846   BILIRUBINUR NEGATIVE 02/04/2015 1649   KETONESUR Negative 04/24/2015 0846   [place] 02/04/2015 1649   PROTEINUR 30 04/24/2015 0846   PROTEINUR 100* 02/04/2015 1649   UROBILINOGEN 0.2 04/24/2015 0846   UROBILINOGEN 0.2 02/04/2015 1649   NITRITE Negative 04/24/2015 0846   NITRITE POSITIVE* 02/04/2015 1649   LEUKOCYTESUR Negative 04/24/2015 0846   LEUKOCYTESUR LARGE* 02/04/2015 1649    STUDIES: No results found.   ASSESSMENT:  82 y.o. patient with a diagnosis of    #1 Primary hepatic non-Hodgkin's lymphoma established through liver biopsy February 2006, treated with Rituxan x9 and then CHOP x6.  All chemotherapy completed in  2007.  Maintenance Rituxan discontinued October 2008.     #2  Biopsy proven retroperitoneal recurrence documented November 2014. Bone marrow biopsy 04/01/2013 was negative   #3 status post  laparoscopic cholecystectomy and port placement on 04/04/2013.  #4 Cycle #1  RICE chemotherapy  completed on 05/02/2013.   #5 cycle #2 RICE chemotherapy  on 05/17/2013  #6 PET scan performed on 05/23/2013 revealed marked partial response to chemotherapy with a retroperitoneal mass decrease in metabolic activity from SUV of 13.2 to 6.3. Right adrenal mass size and metabolic activity is resolved when compared to the previous PET scan.   #7 Evaluated at [facility] by Dr. Tomasa Hosteller for autologous transplant on 05/27/2013 and was quoted a 3% chance of significant heart damage and possibly death from the treatment and a 50% chance of being alive 5 years from now after transplant (versus perhaps 2 years without). After much thought the patient and family have decided firmly they do not wish to proceed to stem cell transplant.  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 treated with vancomycin IV x14 days completed 07/02/2013, followed by doxycycline for 2 additional weeks  #10 started maintenance Rituxan May 2015 (1 dose every 8 weeks)  #11 A-fib, on rivaroxaban  #12 prostate cancer, stage IV-- per Dr [name]  (a) obstructive uropathy secondary to bladder mass (Gleason 9), s/p  L stenting 02/05/2015, exchanged PRN  PLAN: [name] has a significant oral infection, which hopefully will soon improve with Augmentin. He will then need multiple extractions and dentures.  I am going to hold off on right rituximab today. I am concerned it would further weaken his immune [name].Hopefully we can resume it onAugust 11, when he returns to see me.  Otherwise from my point of view he has a good white cell count, his hemoglobin is moderate,and his platelet count is  normal.I am not restricting any oral surgery. Of course the main issue there is, be his anticoagulant and that is being handled through cardiology  He knows to call for any problems that may develop before his next visit here, which will be August 11.  [name], MD   10/15/2015

## 2015-10-15 NOTE — Telephone Encounter (Signed)
R/s appt per GM 6/22 pof. avs printed

## 2015-10-16 ENCOUNTER — Emergency Department (HOSPITAL_COMMUNITY)
Admission: EM | Admit: 2015-10-16 | Discharge: 2015-10-17 | Disposition: A | Discharge status: Home / Self Care | Payer: Medicare Other | Attending: Emergency Medicine | Admitting: Emergency Medicine

## 2015-10-16 DIAGNOSIS — Z8546 Personal history of malignant neoplasm of prostate: Secondary | ICD-10-CM | POA: Diagnosis not present

## 2015-10-16 DIAGNOSIS — E1121 Type 2 diabetes mellitus with diabetic nephropathy: Secondary | ICD-10-CM | POA: Diagnosis not present

## 2015-10-16 DIAGNOSIS — Z79899 Other long term (current) drug therapy: Secondary | ICD-10-CM | POA: Diagnosis not present

## 2015-10-16 DIAGNOSIS — K029 Dental caries, unspecified: Secondary | ICD-10-CM | POA: Diagnosis not present

## 2015-10-16 DIAGNOSIS — C61 Malignant neoplasm of prostate: Secondary | ICD-10-CM | POA: Diagnosis not present

## 2015-10-16 DIAGNOSIS — Z87891 Personal history of nicotine dependence: Secondary | ICD-10-CM | POA: Diagnosis not present

## 2015-10-16 DIAGNOSIS — M1990 Unspecified osteoarthritis, unspecified site: Secondary | ICD-10-CM | POA: Diagnosis not present

## 2015-10-16 DIAGNOSIS — Z8505 Personal history of malignant neoplasm of liver: Secondary | ICD-10-CM | POA: Diagnosis not present

## 2015-10-16 DIAGNOSIS — Z7984 Long term (current) use of oral hypoglycemic drugs: Secondary | ICD-10-CM | POA: Diagnosis not present

## 2015-10-16 DIAGNOSIS — G4762 Sleep related leg cramps: Secondary | ICD-10-CM | POA: Diagnosis not present

## 2015-10-16 DIAGNOSIS — I4891 Unspecified atrial fibrillation: Secondary | ICD-10-CM | POA: Insufficient documentation

## 2015-10-16 DIAGNOSIS — I10 Essential (primary) hypertension: Secondary | ICD-10-CM | POA: Diagnosis not present

## 2015-10-16 DIAGNOSIS — E119 Type 2 diabetes mellitus without complications: Secondary | ICD-10-CM | POA: Insufficient documentation

## 2015-10-16 DIAGNOSIS — E1140 Type 2 diabetes mellitus with diabetic neuropathy, unspecified: Secondary | ICD-10-CM | POA: Diagnosis not present

## 2015-10-16 DIAGNOSIS — K122 Cellulitis and abscess of mouth: Secondary | ICD-10-CM | POA: Diagnosis not present

## 2015-10-16 DIAGNOSIS — Z23 Encounter for immunization: Secondary | ICD-10-CM | POA: Diagnosis not present

## 2015-10-16 DIAGNOSIS — K0889 Other specified disorders of teeth and supporting structures: Secondary | ICD-10-CM | POA: Diagnosis present

## 2015-10-17 ENCOUNTER — Emergency Department (HOSPITAL_COMMUNITY)
Admission: EM | Admit: 2015-10-17 | Discharge: 2015-10-18 | Disposition: A | Discharge status: Home / Self Care | Payer: Medicare Other | Source: Home / Self Care | Attending: Emergency Medicine | Admitting: Emergency Medicine

## 2015-10-17 ENCOUNTER — Encounter (HOSPITAL_COMMUNITY): Payer: Self-pay | Admitting: Emergency Medicine

## 2015-10-17 ENCOUNTER — Encounter (HOSPITAL_COMMUNITY): Payer: Self-pay | Admitting: *Deleted

## 2015-10-17 DIAGNOSIS — M1990 Unspecified osteoarthritis, unspecified site: Secondary | ICD-10-CM

## 2015-10-17 DIAGNOSIS — K029 Dental caries, unspecified: Secondary | ICD-10-CM | POA: Insufficient documentation

## 2015-10-17 DIAGNOSIS — I10 Essential (primary) hypertension: Secondary | ICD-10-CM

## 2015-10-17 DIAGNOSIS — Z79899 Other long term (current) drug therapy: Secondary | ICD-10-CM

## 2015-10-17 DIAGNOSIS — K122 Cellulitis and abscess of mouth: Secondary | ICD-10-CM | POA: Insufficient documentation

## 2015-10-17 DIAGNOSIS — Z8505 Personal history of malignant neoplasm of liver: Secondary | ICD-10-CM | POA: Insufficient documentation

## 2015-10-17 DIAGNOSIS — Z85828 Personal history of other malignant neoplasm of skin: Secondary | ICD-10-CM | POA: Insufficient documentation

## 2015-10-17 DIAGNOSIS — Z7984 Long term (current) use of oral hypoglycemic drugs: Secondary | ICD-10-CM

## 2015-10-17 DIAGNOSIS — Z87891 Personal history of nicotine dependence: Secondary | ICD-10-CM | POA: Insufficient documentation

## 2015-10-17 DIAGNOSIS — Z8546 Personal history of malignant neoplasm of prostate: Secondary | ICD-10-CM

## 2015-10-17 DIAGNOSIS — I4891 Unspecified atrial fibrillation: Secondary | ICD-10-CM

## 2015-10-17 DIAGNOSIS — E119 Type 2 diabetes mellitus without complications: Secondary | ICD-10-CM

## 2015-10-17 LAB — CBC WITH DIFFERENTIAL/PLATELET
BASOS ABS: 0 10*3/uL (ref 0.0–0.1)
BASOS PCT: 0 %
EOS ABS: 0.3 10*3/uL (ref 0.0–0.7)
Eosinophils Relative: 3 %
HEMATOCRIT: 33.1 % — AB (ref 39.0–52.0)
HEMOGLOBIN: 11.2 g/dL — AB (ref 13.0–17.0)
Lymphocytes Relative: 4 %
Lymphs Abs: 0.4 10*3/uL — ABNORMAL LOW (ref 0.7–4.0)
MCH: 27.7 pg (ref 26.0–34.0)
MCHC: 33.8 g/dL (ref 30.0–36.0)
MCV: 81.7 fL (ref 78.0–100.0)
Monocytes Absolute: 0.6 10*3/uL (ref 0.1–1.0)
Monocytes Relative: 7 %
NEUTROS ABS: 7 10*3/uL (ref 1.7–7.7)
NEUTROS PCT: 86 %
Platelets: 194 10*3/uL (ref 150–400)
RBC: 4.05 MIL/uL — ABNORMAL LOW (ref 4.22–5.81)
RDW: 16.1 % — ABNORMAL HIGH (ref 11.5–15.5)
WBC: 8.2 10*3/uL (ref 4.0–10.5)

## 2015-10-17 LAB — I-STAT CHEM 8, ED
BUN: 24 mg/dL — AB (ref 6–20)
CREATININE: 1.5 mg/dL — AB (ref 0.61–1.24)
Calcium, Ion: 1.24 mmol/L (ref 1.13–1.30)
Chloride: 102 mmol/L (ref 101–111)
Glucose, Bld: 147 mg/dL — ABNORMAL HIGH (ref 65–99)
HEMATOCRIT: 34 % — AB (ref 39.0–52.0)
HEMOGLOBIN: 11.6 g/dL — AB (ref 13.0–17.0)
POTASSIUM: 4.3 mmol/L (ref 3.5–5.1)
SODIUM: 138 mmol/L (ref 135–145)
TCO2: 25 mmol/L (ref 0–100)

## 2015-10-17 LAB — CBG MONITORING, ED: Glucose-Capillary: 149 mg/dL — ABNORMAL HIGH (ref 65–99)

## 2015-10-17 MED ORDER — CLINDAMYCIN PHOSPHATE 600 MG/50ML IV SOLN
600.0000 mg | Freq: Once | INTRAVENOUS | Status: AC
  Administered 2015-10-17: 600 mg via INTRAVENOUS
  Filled 2015-10-17: qty 50

## 2015-10-17 MED ORDER — DICLOFENAC SODIUM ER 100 MG PO TB24: 100.0000 mg | ORAL_TABLET | Freq: Every day | ORAL | Status: DC

## 2015-10-17 MED ORDER — TRAMADOL HCL 50 MG PO TABS
50.0000 mg | ORAL_TABLET | Freq: Four times a day (QID) | ORAL | Status: DC | PRN
Start: 2015-10-17 — End: 2015-10-29

## 2015-10-17 MED ORDER — KETOROLAC TROMETHAMINE 30 MG/ML IJ SOLN
30.0000 mg | Freq: Once | INTRAMUSCULAR | Status: AC
  Administered 2015-10-17: 30 mg via INTRAVENOUS
  Filled 2015-10-17: qty 1

## 2015-10-17 MED ORDER — HEPARIN SOD (PORK) LOCK FLUSH 100 UNIT/ML IV SOLN
500.0000 [IU] | Freq: Once | INTRAVENOUS | Status: AC
  Administered 2015-10-17: 500 [IU]
  Filled 2015-10-17: qty 5

## 2015-10-17 MED ORDER — ALIGN PO CAPS: 1.0000 | ORAL_CAPSULE | Freq: Four times a day (QID) | ORAL | Status: DC

## 2015-10-17 MED ORDER — SODIUM CHLORIDE 0.9 % IV BOLUS (SEPSIS)
1000.0000 mL | Freq: Once | INTRAVENOUS | Status: AC
  Administered 2015-10-17: 1000 mL via INTRAVENOUS

## 2015-10-17 MED ORDER — OXYCODONE-ACETAMINOPHEN 5-325 MG PO TABS
1.0000 | ORAL_TABLET | ORAL | Status: AC | PRN
Start: 2015-10-17 — End: 2015-10-17
  Administered 2015-10-17 (×2): 1 via ORAL
  Filled 2015-10-17 (×2): qty 1

## 2015-10-17 MED ORDER — CLINDAMYCIN HCL 300 MG PO CAPS: 300.0000 mg | ORAL_CAPSULE | Freq: Four times a day (QID) | ORAL | Status: DC

## 2015-10-17 NOTE — ED Notes (Signed)
Pt c/o L sided maxillary dental pain, per pts daughter pt has obtained med clearance from MD for oral surgery for several teeth to be removed, daughter states she spoke with DDS and oral surgeon and was told to come to ED for pain control and possible I & D. Large swollen area noted to roof of mouth. Several dental caries noted, pt is chemo patient, last tx 8 weeks ago.

## 2015-10-17 NOTE — ED Notes (Signed)
MD at bedside. 

## 2015-10-17 NOTE — Discharge Instructions (Signed)
Dental Care and Dentist Visits °Dental care supports good overall health. Regular dental visits can also help you avoid dental pain, bleeding, infection, and other more serious health problems in the future. It is important to keep the mouth healthy because diseases in the teeth, gums, and other oral tissues can spread to other areas of the body. Some problems, such as diabetes, heart disease, and pre-term labor have been associated with poor oral health.  °See your dentist every 6 months. If you experience emergency problems such as a toothache or broken tooth, go to the dentist right away. If you see your dentist regularly, you may catch problems early. It is easier to be treated for problems in the early stages.  °WHAT TO EXPECT AT A DENTIST VISIT  °Your dentist will look for many common oral health problems and recommend proper treatment. At your regular dental visit, you can expect: °· Gentle cleaning of the teeth and gums. This includes scraping and polishing. This helps to remove the sticky substance around the teeth and gums (plaque). Plaque forms in the mouth shortly after eating. Over time, plaque hardens on the teeth as tartar. If tartar is not removed regularly, it can cause problems. Cleaning also helps remove stains. °· Periodic X-rays. These pictures of the teeth and supporting bone will help your dentist assess the health of your teeth. °· Periodic fluoride treatments. Fluoride is a natural mineral shown to help strengthen teeth. Fluoride treatment involves applying a fluoride gel or varnish to the teeth. It is most commonly done in children. °· Examination of the mouth, tongue, jaws, teeth, and gums to look for any oral health problems, such as: °¨ Cavities (dental caries). This is decay on the tooth caused by plaque, sugar, and acid in the mouth. It is best to catch a cavity when it is small. °¨ Inflammation of the gums caused by plaque buildup (gingivitis). °¨ Problems with the mouth or malformed  or misaligned teeth. °¨ Oral cancer or other diseases of the soft tissues or jaws.  °KEEP YOUR TEETH AND GUMS HEALTHY °For healthy teeth and gums, follow these general guidelines as well as your dentist's specific advice: °· Have your teeth professionally cleaned at the dentist every 6 months. °· Brush twice daily with a fluoride toothpaste. °· Floss your teeth daily.  °· Ask your dentist if you need fluoride supplements, treatments, or fluoride toothpaste. °· Eat a healthy diet. Reduce foods and drinks with added sugar. °· Avoid smoking. °TREATMENT FOR ORAL HEALTH PROBLEMS °If you have oral health problems, treatment varies depending on the conditions present in your teeth and gums. °· Your caregiver will most likely recommend good oral hygiene at each visit. °· For cavities, gingivitis, or other oral health disease, your caregiver will perform a procedure to treat the problem. This is typically done at a separate appointment. Sometimes your caregiver will refer you to another dental specialist for specific tooth problems or for surgery. °SEEK IMMEDIATE DENTAL CARE IF: °· You have pain, bleeding, or soreness in the gum, tooth, jaw, or mouth area. °· A permanent tooth becomes loose or separated from the gum socket. °· You experience a blow or injury to the mouth or jaw area. °  °This information is not intended to replace advice given to you by your health care provider. Make sure you discuss any questions you have with your health care provider. °  °Document Released: 12/22/2010 Document Revised: 07/04/2011 Document Reviewed: 12/22/2010 °Elsevier Interactive Patient Education ©2016 [employer] ° °

## 2015-10-17 NOTE — ED Notes (Signed)
Pt complains of pain in his mouth for the past 2 weeks. Pt ws seen last night for same. Pt is to see oral surgeon to remove teeth but states pain is not manageable with tramadol prescribed last night. Pt last had chemo 8 weeks ago, states his chemo was held Thursday due to upcoming oral surgery.

## 2015-10-17 NOTE — ED Provider Notes (Signed)
CSN: [id number]     Arrival date & time 10/16/15  2317 History  By signing my name below, I, [facility], attest that this documentation has been prepared under the direction and in the presence of Vilma Will, MD. Electronically Signed: Virgel Bouquet, ED Scribe. 10/17/2015. 4:03 AM.   Chief Complaint  Patient presents with  . Dental Pain    Patient is a 82 y.o. male presenting with tooth pain. The history is provided by a relative. No language interpreter was used.  Dental Pain Location:  Upper Upper teeth location:  14/LU 1st molar, 15/LU 2nd molar, 13/LU 2nd bicuspid and 12/LU 1st bicuspid Severity:  Severe Onset quality:  Gradual Timing:  Constant Progression:  Worsening Chronicity:  New Context: dental caries, dental fracture and poor dentition   Context: not trauma   Previous work-up:  Dental exam, filled cavity and root canal Relieved by:  Nothing Worsened by:  Nothing tried Ineffective treatments:  Acetaminophen Associated symptoms: no congestion   Risk factors: no alcohol problem    HPI Comments: JAVALE ATHANS is a 82 y.o. male with a hx of HTN, liver cancer, prostate cancer, lymphoma, skin cancer, GERD, and DM who presents to the Emergency Department complaining of constant, moderate dental pain. Daughter states that the pt has 2 abscesses in his mouth and has been seen by his PCP, oncologist, and a DDS who have consulted with each other and cleared him for oral surgery 2 days ago. She notes that she called and left a message for the oral surgeon but was unable to make the pt an appointment due to the dental office being closed yesterday. She reports that they were advised to present to the ED for pain management and a possible I&D. He has been taking Augmentin for the past 4 days ago. Denies any other symptoms currently.  Past Medical History  Diagnosis Date  . Hypertension   . Cancer of liver ([place])   . Kidney stone   . Prostate cancer ([place])   . Skin  cancer   . Arthritis   . Anemia   . GERD (gastroesophageal reflux disease)   . Lymphoma ([place])   . [name]   . Neuropathy in diabetes ([place])     Hx: of  . Diabetes mellitus     INSULIN DEPENDENT  . HX, PERSONAL, MALIGNANCY, PROSTATE 07/28/2006    Annotation: 2001, resected Qualifier: Diagnosis of  By: [name], [name]    . A-fib ([place])   . Near syncope 10/18/2013  . Memory deficit 10/18/2013  . HOH (hard of hearing)   . Sleep apnea     on CPAP    Past Surgical History  Procedure Laterality Date  . Prostatectomy    . Kidney stone surgery    . Rotator cuff repair    . Ankle surgery    . Cardiac catheterization  09/16/96    Normal LV systolic function,dense ca+ prox. portion of the LAD w/50% narrowing in the distal portion, 30-40% irreg. in the proximal portion & 80% narrowing in the ostial portion of the posterolateral branch.  . Colonoscopy      Hx: of  . Infusion port  04/04/2013    RIGHT SUBCLAVIAN  . Cholecystectomy  04/04/2013  . Cholecystectomy N/A 04/04/2013    Procedure: LAPAROSCOPIC CHOLECYSTECTOMY WITH INTRAOPERATIVE CHOLANGIOGRAM;  [name], MD;  Location: [facility];  Service: General;  [name];  . Portacath placement N/A 04/04/2013    Procedure: INSERTION PORT-A-CATH;  [name], MD;  Location: [facility];  Service: General;  [name];  . Cystoscopy with stent placement Bilateral 02/05/2015    Procedure: CYSTOSCOPY RETROGRADE AND BILATERAL  STENT PLACEMENT;  Surgeon: Kathie Rhodes, MD;  [facility];  Service: Urology;  Laterality: Bilateral;  . Transurethral resection of bladder tumor with gyrus (turbt-gyrus) N/A 02/05/2015    Procedure: TRANSURETHRAL RESECTION OF BLADDER TUMOR  ;  Surgeon: Kathie Rhodes, MD;  [facility];  Service: Urology;  [name];  . Cystoscopy w/ ureteral stent placement Bilateral 06/01/2015    Procedure: CYSTOSCOPY WITH BILATERAL STENT REPLACEMENT;  Surgeon: Raynelle Bring, MD;  [facility];  Service: Urology;  Laterality: Bilateral;   Family History  Problem Relation Age of Onset  . Heart disease Father   . Heart attack Father   . Cancer Sister   . Heart attack Brother   . Heart attack Brother   . Heart attack Brother   . Heart attack Brother   . Heart attack Brother   . Cancer Brother   . Cancer Brother    Social History  Substance Use Topics  . Smoking status: Former Smoker    Types: Pipe, Landscape architect  . Smokeless tobacco: Former Systems developer    Types: Chew     Comment: [place] YEARS AGO "  . Alcohol Use: No    Review of Systems  HENT: Positive for dental problem. Negative for congestion.   All other systems reviewed and are negative.     Allergies  Atenolol and Niacin  Home Medications   Prior to Admission medications   Medication Sig Start Date End Date Taking? Authorizing Provider  allopurinol (ZYLOPRIM) 300 MG tablet Take 1 tablet (300 mg total) by mouth daily. 06/16/15  Yes [name], MD  amLODipine (NORVASC) 5 MG tablet Take 2.5 mg by mouth daily.    Yes Historical Provider, MD  amoxicillin-clavulanate (AUGMENTIN) 875-125 MG tablet Take 1 tablet by mouth 2 (two) times daily.   Yes Historical Provider, MD  atorvastatin (LIPITOR) 20 MG tablet Take 20 mg by mouth at bedtime.   Yes Historical Provider, MD  [place] 5-2.5-18.5 injection Inject 1 each into the skin once. 10/16/15  Yes Historical Provider, MD  Calcium-Magnesium-Vitamin D (CALCIUM 1200+D3 PO) Take 1 tablet by mouth daily.    Yes Historical Provider, MD  cetirizine (ZYRTEC) 10 MG tablet Take 10 mg by mouth daily.   Yes Historical Provider, MD  cholestyramine Lucrezia Starch) 4 G packet Take 1 packet (4 g total) by mouth 3 (three) times daily with meals. Patient taking differently: Take 4 g by mouth 3 (three) times daily as needed (Diarrhea).  05/02/14  Yes [name], MD  Cyanocobalamin (VITAMIN B 12 PO) Take 2,500 mg by mouth daily.    Yes Historical Provider, MD  fenofibrate 160  MG tablet Take 160 mg by mouth at bedtime.   Yes Historical Provider, MD  gabapentin (NEURONTIN) 300 MG capsule Take 300 mg by mouth 3 (three) times daily.    Yes Historical Provider, MD  lidocaine-prilocaine (EMLA) cream Apply 1 application topically as needed. Patient taking differently: Apply 1 application topically daily as needed (port numbing).  06/27/14  Yes [name], MD  magnesium oxide (MAG-OX) 400 (241.3 MG) MG tablet Take 800 tablets by mouth 2 (two) times daily.  01/12/15  Yes Historical Provider, MD  metFORMIN (GLUCOPHAGE) 1000 MG tablet Take 1,000 mg by mouth 2 (two) times daily with a meal.  Yes Historical Provider, MD  Multiple Vitamins-Minerals (MULTIVITAMIN WITH MINERALS) tablet Take 1 tablet by mouth daily.   Yes Historical Provider, MD  Rivaroxaban (XARELTO) 15 MG TABS tablet Take 15 mg by mouth every evening.    Yes Historical Provider, MD  sertraline (ZOLOFT) 100 MG tablet Take 100 mg by mouth daily.   Yes Historical Provider, MD  TOUJEO SOLOSTAR 300 UNIT/ML SOPN INJECT 60 UNITS SUB-Q ONCE DAILY 03/01/15  Yes Historical Provider, MD  vitamin C (ASCORBIC ACID) 500 MG tablet Take 500 mg by mouth daily.   Yes Historical Provider, MD   BP 133/70 mmHg  Pulse 76  Temp(Src) 98.1 F (36.7 C) (Oral)  Resp 18  Ht 5\' 6"  (1.676 m)  Wt 180 lb (81.647 kg)  BMI 29.07 kg/m2  SpO2 94% Physical Exam  Constitutional: He is oriented to person, place, and time. He appears well-developed and well-nourished. No distress.  HENT:  Head: Normocephalic and atraumatic.  Mouth/Throat: Oropharynx is clear and moist and mucous membranes are normal. No trismus in the jaw. Abnormal dentition. Dental caries present. No uvula swelling. No oropharyngeal exudate, posterior oropharyngeal edema, posterior oropharyngeal erythema or tonsillar abscesses.  Moist mucus membranes. 7 upper teeth need to be extracted. 1 lower tooth needs a root canal.  Eyes: EOM are normal. Pupils are equal, round, and  reactive to light.  PERRL.  Neck: Normal range of motion. Neck supple. Carotid bruit is not present. No tracheal deviation present.  Cardiovascular: Normal rate, regular rhythm and intact distal pulses.   Regular rate and rhythm.  Pulmonary/Chest: Effort normal and breath sounds normal. No stridor. No respiratory distress. He has no wheezes. He has no rales.  Lungs CTA bilaterally.  Abdominal: Soft. Bowel sounds are normal. He exhibits no distension. There is no tenderness. There is no rebound and no guarding.  Good bowel sounds.  Musculoskeletal: Normal range of motion.  Neurological: He is alert and oriented to person, place, and time. He has normal reflexes.  Skin: Skin is warm and dry. He is not diaphoretic.  Psychiatric: He has a normal mood and affect. His behavior is normal.  Nursing note and vitals reviewed.   ED Course  Procedures   DIAGNOSTIC STUDIES: Oxygen Saturation is 94% on RA, adequate by my interpretation.    COORDINATION OF CARE: 3:54 AM Ordered labs, IV fluids, clindamycin, Toradol. Discussed treatment plan with pt at bedside and pt agreed to plan.   Labs Review Labs Reviewed  CBC WITH DIFFERENTIAL/PLATELET - Abnormal; Notable for the following:    RBC 4.05 (*)    Hemoglobin 11.2 (*)    HCT 33.1 (*)    RDW 16.1 (*)    Lymphs Abs 0.4 (*)    All other components within normal limits  I-STAT CHEM 8, ED - Abnormal; Notable for the following:    BUN 24 (*)    Creatinine, Ser 1.50 (*)    Glucose, Bld 147 (*)    Hemoglobin 11.6 (*)    HCT 34.0 (*)    All other components within normal limits  CBG MONITORING, ED - Abnormal; Notable for the following:    Glucose-Capillary 149 (*)    All other components within normal limits    Imaging Review No results found. I have personally reviewed and evaluated these images and lab results as part of my medical decision-making.   EKG Interpretation None      MDM   Final diagnoses:  None    Filed Vitals:    10/17/15  0600 10/17/15 0630  BP: 106/65 117/65  Pulse: 64 66  Temp:    Resp:     Results for orders placed or performed during the hospital encounter of 10/16/15  CBC with Differential/Platelet  Result Value Ref Range   WBC 8.2 4.0 - 10.5 K/uL   RBC 4.05 (L) 4.22 - 5.81 MIL/uL   Hemoglobin 11.2 (L) 13.0 - 17.0 g/dL   HCT 33.1 (L) 39.0 - 52.0 %   MCV 81.7 78.0 - 100.0 fL   MCH 27.7 26.0 - 34.0 pg   MCHC 33.8 30.0 - 36.0 g/dL   RDW 16.1 (H) 11.5 - 15.5 %   Platelets 194 150 - 400 K/uL   Neutrophils Relative % 86 %   Neutro Abs 7.0 1.7 - 7.7 K/uL   Lymphocytes Relative 4 %   Lymphs Abs 0.4 (L) 0.7 - 4.0 K/uL   Monocytes Relative 7 %   Monocytes Absolute 0.6 0.1 - 1.0 K/uL   Eosinophils Relative 3 %   Eosinophils Absolute 0.3 0.0 - 0.7 K/uL   Basophils Relative 0 %   Basophils Absolute 0.0 0.0 - 0.1 K/uL  I-Stat Chem 8, ED  Result Value Ref Range   Sodium 138 135 - 145 mmol/L   Potassium 4.3 3.5 - 5.1 mmol/L   Chloride 102 101 - 111 mmol/L   BUN 24 (H) 6 - 20 mg/dL   Creatinine, Ser 1.50 (H) 0.61 - 1.24 mg/dL   Glucose, Bld 147 (H) 65 - 99 mg/dL   Calcium, Ion 1.24 1.13 - 1.30 mmol/L   TCO2 25 0 - 100 mmol/L   Hemoglobin 11.6 (L) 13.0 - 17.0 g/dL   HCT 34.0 (L) 39.0 - 52.0 %  POC CBG, ED  Result Value Ref Range   Glucose-Capillary 149 (H) 65 - 99 mg/dL   Comment 1 Notify RN    No results found.  Medications  sodium chloride 0.9 % bolus 1,000 mL (0 mLs Intravenous Stopped 10/17/15 0605)  clindamycin (CLEOCIN) IVPB 600 mg (0 mg Intravenous Stopped 10/17/15 0442)  ketorolac (TORADOL) 30 MG/ML injection 30 mg (30 mg Intravenous Given 10/17/15 0412)  heparin lock flush 100 unit/mL (500 Units Intracatheter Given 10/17/15 [id number])   Patient recently had chemotherapy and is a diabetic on xarelto.  Patient has a hardened area next to one of the teeth, not ballotable.  I am not comfortably I/D this patient on xarelto and chemotherapy.    Sleeping soundly post medication.  Will  change antibiotic to clindamycin and start QID probiotics, have also recommended greek yogurt.  Voltaren XR and ultram for break through pain.  Will need to be seen by oral surgery.   Strict return precautions given  I personally performed the services described in this documentation, which was scribed in my presence. The recorded information has been reviewed and is accurate.      Veatrice Kells, MD 10/17/15 [phone]

## 2015-10-18 DIAGNOSIS — K029 Dental caries, unspecified: Secondary | ICD-10-CM | POA: Diagnosis not present

## 2015-10-18 LAB — CBC WITH DIFFERENTIAL/PLATELET
BASOS PCT: 0 %
Basophils Absolute: 0 10*3/uL (ref 0.0–0.1)
EOS ABS: 0.2 10*3/uL (ref 0.0–0.7)
Eosinophils Relative: 3 %
HEMATOCRIT: 29.2 % — AB (ref 39.0–52.0)
Hemoglobin: 9.6 g/dL — ABNORMAL LOW (ref 13.0–17.0)
LYMPHS ABS: 0.4 10*3/uL — AB (ref 0.7–4.0)
Lymphocytes Relative: 5 %
MCH: 27.8 pg (ref 26.0–34.0)
MCHC: 32.9 g/dL (ref 30.0–36.0)
MCV: 84.6 fL (ref 78.0–100.0)
MONO ABS: 0.3 10*3/uL (ref 0.1–1.0)
MONOS PCT: 4 %
NEUTROS ABS: 6.9 10*3/uL (ref 1.7–7.7)
NEUTROS PCT: 88 %
Platelets: 146 10*3/uL — ABNORMAL LOW (ref 150–400)
RBC: 3.45 MIL/uL — ABNORMAL LOW (ref 4.22–5.81)
RDW: 16.1 % — AB (ref 11.5–15.5)
WBC: 7.8 10*3/uL (ref 4.0–10.5)

## 2015-10-18 LAB — BASIC METABOLIC PANEL
ANION GAP: 5 (ref 5–15)
BUN: 29 mg/dL — AB (ref 6–20)
CHLORIDE: 110 mmol/L (ref 101–111)
CO2: 21 mmol/L — ABNORMAL LOW (ref 22–32)
Calcium: 8.5 mg/dL — ABNORMAL LOW (ref 8.9–10.3)
Creatinine, Ser: 1.79 mg/dL — ABNORMAL HIGH (ref 0.61–1.24)
GFR, EST AFRICAN AMERICAN: 39 mL/min — AB (ref >60)
GFR, EST NON AFRICAN AMERICAN: 34 mL/min — AB (ref >60)
Glucose, Bld: 174 mg/dL — ABNORMAL HIGH (ref 65–99)
POTASSIUM: 4.2 mmol/L (ref 3.5–5.1)
SODIUM: 136 mmol/L (ref 135–145)

## 2015-10-18 MED ORDER — FENTANYL CITRATE (PF) 100 MCG/2ML IJ SOLN: 50.0000 ug | INTRAMUSCULAR | Status: DC | PRN

## 2015-10-18 MED ORDER — CLINDAMYCIN PHOSPHATE 600 MG/50ML IV SOLN
600.0000 mg | Freq: Once | INTRAVENOUS | Status: AC
  Administered 2015-10-18: 600 mg via INTRAVENOUS
  Filled 2015-10-18: qty 50

## 2015-10-18 MED ORDER — OXYCODONE-ACETAMINOPHEN 5-325 MG PO TABS: 1.0000 | ORAL_TABLET | Freq: Four times a day (QID) | ORAL | Status: DC | PRN

## 2015-10-18 MED ORDER — HEPARIN SOD (PORK) LOCK FLUSH 100 UNIT/ML IV SOLN
500.0000 [IU] | Freq: Once | INTRAVENOUS | Status: AC
  Administered 2015-10-18: 500 [IU]
  Filled 2015-10-18: qty 5

## 2015-10-18 NOTE — ED Provider Notes (Signed)
CSN: [id number]     Arrival date & time 10/17/15  2048 History  By signing my name below, I, Georgette Shell, attest that this documentation has been prepared under the direction and in the presence of Shanon Rosser, MD. Electronically Signed: Georgette Shell, ED Scribe. 10/18/2015. 1:15 AM.  Chief Complaint  Patient presents with  . Dental Pain   The history is provided by the patient and a relative. No language interpreter was used.    HPI Comments: [name] is a 82 y.o. male with h/o liver cancer, prostate cancer, skin cancer, HTN and DM who presents to the Emergency Department complaining of worsening dental pain and palatine abscess onset 2 weeks ago and gradually worsening since. Patient is able to eat. Pt was seen in the ED two days ago for the same complaint and was prescribed Tramadol and antibiotics with no relief. Doctor was hesitant to perform I&D due to anticoagulation. He has a pending appointment to have his teeth removed by an oral surgeon.  Hehas come back today because pain is not controlled by tramadol. Pain was moderate to severe. Pt was given Percocet in triage per protocol with significant relief. He has not had a fever.    Past Medical History  Diagnosis Date  . Hypertension   . Cancer of liver ([place])   . Kidney stone   . Prostate cancer ([place])   . Skin cancer   . Arthritis   . Anemia   . GERD (gastroesophageal reflux disease)   . Lymphoma ([place])   . [name]   . Neuropathy in diabetes ([place])     Hx: of  . Diabetes mellitus     INSULIN DEPENDENT  . HX, PERSONAL, MALIGNANCY, PROSTATE 07/28/2006    Annotation: 2001, resected Qualifier: Diagnosis of  By: [name], [name]    . A-fib ([place])   . Near syncope 10/18/2013  . Memory deficit 10/18/2013  . HOH (hard of hearing)   . Sleep apnea     on CPAP    Past Surgical History  Procedure Laterality Date  . Prostatectomy    . Kidney stone surgery    . Rotator cuff repair    . Ankle surgery    . Cardiac  catheterization  09/16/96    Normal LV systolic function,dense ca+ prox. portion of the LAD w/50% narrowing in the distal portion, 30-40% irreg. in the proximal portion & 80% narrowing in the ostial portion of the posterolateral branch.  . Colonoscopy      Hx: of  . Infusion port  04/04/2013    RIGHT SUBCLAVIAN  . Cholecystectomy  04/04/2013  . Cholecystectomy N/A 04/04/2013    Procedure: LAPAROSCOPIC CHOLECYSTECTOMY WITH INTRAOPERATIVE CHOLANGIOGRAM;  [name], MD;  Location: [facility];  Service: General;  [name];  . Portacath placement N/A 04/04/2013    Procedure: INSERTION PORT-A-CATH;  [name], MD;  Location: [facility];  Service: General;  [name];  . Cystoscopy with stent placement Bilateral 02/05/2015    Procedure: CYSTOSCOPY RETROGRADE AND BILATERAL  STENT PLACEMENT;  Surgeon: Kathie Rhodes, MD;  [facility];  Service: Urology;  Laterality: Bilateral;  . Transurethral resection of bladder tumor with gyrus (turbt-gyrus) N/A 02/05/2015    Procedure: TRANSURETHRAL RESECTION OF BLADDER TUMOR  ;  Surgeon: Kathie Rhodes, MD;  [facility];  Service: Urology;  [name];  . Cystoscopy w/ ureteral stent placement Bilateral 06/01/2015    Procedure: CYSTOSCOPY WITH BILATERAL STENT REPLACEMENT;  Surgeon: Raynelle Bring, MD;  [facility];  Service: Urology;  Laterality: Bilateral;   Family History  Problem Relation Age of Onset  . Heart disease Father   . Heart attack Father   . Cancer Sister   . Heart attack Brother   . Heart attack Brother   . Heart attack Brother   . Heart attack Brother   . Heart attack Brother   . Cancer Brother   . Cancer Brother    Social History  Substance Use Topics  . Smoking status: Former Smoker    Types: Pipe, Landscape architect  . Smokeless tobacco: Former Systems developer    Types: Chew     Comment: [place] YEARS AGO "  . Alcohol Use: No    Review of Systems A complete 10 system review of systems was obtained  and all systems are negative except as noted in the HPI and PMH.   Allergies  Atenolol and Niacin  Home Medications   Prior to Admission medications   Medication Sig Start Date End Date Taking? Authorizing Provider  allopurinol (ZYLOPRIM) 300 MG tablet Take 1 tablet (300 mg total) by mouth daily. 06/16/15  Yes [name], MD  amLODipine (NORVASC) 5 MG tablet Take 2.5 mg by mouth daily.    Yes Historical Provider, MD  atorvastatin (LIPITOR) 20 MG tablet Take 20 mg by mouth at bedtime.   Yes Historical Provider, MD  bifidobacterium infantis (ALIGN) capsule Take 1 capsule by mouth 4 (four) times daily. 10/17/15  Yes [name], MD  Calcium-Magnesium-Vitamin D (CALCIUM 1200+D3 PO) Take 1 tablet by mouth daily.    Yes Historical Provider, MD  cetirizine (ZYRTEC) 10 MG tablet Take 10 mg by mouth daily.   Yes Historical Provider, MD  cholestyramine Lucrezia Starch) 4 G packet Take 1 packet (4 g total) by mouth 3 (three) times daily with meals. Patient taking differently: Take 4 g by mouth 3 (three) times daily as needed (Diarrhea).  05/02/14  Yes [name], MD  clindamycin (CLEOCIN) 300 MG capsule Take 1 capsule (300 mg total) by mouth 4 (four) times daily. X 7 days 10/17/15  Yes [name], MD  Cyanocobalamin (VITAMIN B 12 PO) Take 2,500 mg by mouth daily.    Yes Historical Provider, MD  Diclofenac Sodium CR (VOLTAREN-XR) 100 MG 24 hr tablet Take 1 tablet (100 mg total) by mouth daily. 10/17/15  Yes [name], MD  fenofibrate 160 MG tablet Take 160 mg by mouth at bedtime.   Yes Historical Provider, MD  gabapentin (NEURONTIN) 300 MG capsule Take 300 mg by mouth 3 (three) times daily.    Yes Historical Provider, MD  lidocaine-prilocaine (EMLA) cream Apply 1 application topically as needed. Patient taking differently: Apply 1 application topically daily as needed (port numbing).  06/27/14  Yes [name], MD  magnesium oxide (MAG-OX) 400 (241.3 MG) MG tablet Take 800 tablets by mouth  2 (two) times daily.  01/12/15  Yes Historical Provider, MD  metFORMIN (GLUCOPHAGE) 1000 MG tablet Take 1,000 mg by mouth 2 (two) times daily with a meal.    Yes Historical Provider, MD  Multiple Vitamins-Minerals (MULTIVITAMIN WITH MINERALS) tablet Take 1 tablet by mouth daily.   Yes Historical Provider, MD  sertraline (ZOLOFT) 100 MG tablet Take 100 mg by mouth daily.   Yes Historical Provider, MD  TOUJEO SOLOSTAR 300 UNIT/ML SOPN INJECT 60 UNITS SUB-Q ONCE DAILY 03/01/15  Yes Historical Provider, MD  traMADol (ULTRAM) 50 MG tablet Take 1 tablet (  50 mg total) by mouth every 6 (six) hours as needed for severe pain. 10/17/15  Yes [name], MD  vitamin C (ASCORBIC ACID) 500 MG tablet Take 500 mg by mouth daily.   Yes Historical Provider, MD  Rivaroxaban (XARELTO) 15 MG TABS tablet Take 15 mg by mouth every evening.     Historical Provider, MD   BP 151/76 mmHg  Pulse 87  Temp(Src) 98.4 F (36.9 C) (Oral)  Resp 18  SpO2 100%   Physical Exam  General: Well-developed, well-nourished male in no acute distress; appearance consistent with age of record HENT: normocephalic; atraumatic; tender swollen area to the left hard palate consistent with abscess; multiple fractured/carious teeth  Eyes: pupils equal, round and reactive to light; extraocular muscles intact Neck: supple Heart: regular rate and rhythm Lungs: clear to auscultation bilaterally Abdomen: soft; nondistended; nontender; no masses or hepatosplenomegaly; bowel sounds present Extremities: No deformity; full range of motion; pulses normal; trace edema of lower legs Neurologic: Awake, alert and oriented; motor function intact in all extremities and symmetric; no facial droop Skin: Warm and dry Psychiatric: Normal mood and affect    ED Course  Procedures (including critical care time)   MDM   Nursing notes and vitals signs, including pulse oximetry, reviewed.  Summary of this visit's results, reviewed by myself:  Labs:   Results for orders placed or performed during the hospital encounter of 10/17/15 (from the past 24 hour(s))  Basic metabolic panel     Status: Abnormal   Collection Time: 10/18/15  2:05 AM  Result Value Ref Range   Sodium 136 135 - 145 mmol/L   Potassium 4.2 3.5 - 5.1 mmol/L   Chloride 110 101 - 111 mmol/L   CO2 21 (L) 22 - 32 mmol/L   Glucose, Bld 174 (H) 65 - 99 mg/dL   BUN 29 (H) 6 - 20 mg/dL   Creatinine, Ser 1.79 (H) 0.61 - 1.24 mg/dL   Calcium 8.5 (L) 8.9 - 10.3 mg/dL   GFR calc non Af Amer 34 (L) >60 mL/min   GFR calc Af Amer 39 (L) >60 mL/min   Anion gap 5 5 - 15  CBC with Differential/Platelet     Status: Abnormal   Collection Time: 10/18/15  2:05 AM  Result Value Ref Range   WBC 7.8 4.0 - 10.5 K/uL   RBC 3.45 (L) 4.22 - 5.81 MIL/uL   Hemoglobin 9.6 (L) 13.0 - 17.0 g/dL   HCT 29.2 (L) 39.0 - 52.0 %   MCV 84.6 78.0 - 100.0 fL   MCH 27.8 26.0 - 34.0 pg   MCHC 32.9 30.0 - 36.0 g/dL   RDW 16.1 (H) 11.5 - 15.5 %   Platelets 146 (L) 150 - 400 K/uL   Neutrophils Relative % 88 %   Neutro Abs 6.9 1.7 - 7.7 K/uL   Lymphocytes Relative 5 %   Lymphs Abs 0.4 (L) 0.7 - 4.0 K/uL   Monocytes Relative 4 %   Monocytes Absolute 0.3 0.1 - 1.0 K/uL   Eosinophils Relative 3 %   Eosinophils Absolute 0.2 0.0 - 0.7 K/uL   Basophils Relative 0 %   Basophils Absolute 0.0 0.0 - 0.1 K/uL     Final diagnoses:  Abscess of palate  Dental caries   I personally performed the services described in this documentation, which was scribed in my presence. The recorded information has been reviewed and is accurate.  2:44 AM As the patient has gotten good pain relief with Percocet we  will switch him from tramadol to Percocet.    Shanon Rosser, MD 10/18/15 [phone]

## 2015-10-19 ENCOUNTER — Telehealth: Payer: Self-pay | Admitting: Cardiovascular Disease

## 2015-10-19 HISTORY — PX: MOUTH SURGERY: SHX715

## 2015-10-20 NOTE — Patient Instructions (Signed)
[name]  10/20/2015   Your procedure is scheduled on: 10/29/2015    Report to [facility] Main  Entrance take [place]  elevators to 3rd floor to  [place] at     [phone].  Call this number if you have problems the morning of surgery [phone]   Remember: ONLY 1 PERSON MAY GO WITH YOU TO SHORT STAY TO GET  READY MORNING OF [place].  Do not eat food or drink liquids :After Midnight.             Eat a good healthy snack prior to bedtime.      Take these medicines the morning of surgery with A SIP OF WATER: Allopurinol, Amlodipine, Sertraline, Zyrtec, Percocet if needed.   DO NOT TAKE ANY DIABETIC MEDICATIONS DAY OF YOUR SURGERY                               You may not have any metal on your body including hair pins and              piercings  Do not wear jewelry, , lotions, powders or perfumes, deodorant                       Men may shave face and neck.   Do not bring valuables to the hospital. [place].  Contacts, dentures or bridgework may not be worn into surgery.       Patients discharged the day of surgery will not be allowed to drive home.  Name and phone number of your driver:  Special Instructions: coughing and deep breathing exercises, leg exercises               Please read over the following fact sheets you were given: _____________________________________________________________________             [facility] - Preparing for Surgery Before surgery, you can play an important role.  Because skin is not sterile, your skin needs to be as free of germs as possible.  You can reduce the number of germs on your skin by washing with CHG (chlorahexidine gluconate) soap before surgery.  CHG is an antiseptic cleaner which kills germs and bonds with the skin to continue killing germs even after washing. Please DO NOT use if you have an allergy to CHG or antibacterial soaps.  If your  skin becomes reddened/irritated stop using the CHG and inform your nurse when you arrive at Short Stay. Do not shave (including legs and underarms) for at least 48 hours prior to the first CHG shower.  You may shave your face/neck. Please follow these instructions carefully:  1.  Shower with CHG Soap the night before surgery and the  morning of Surgery.  2.  If you choose to wash your hair, wash your hair first as usual with your  normal  shampoo.  3.  After you shampoo, rinse your hair and body thoroughly to remove the  shampoo.                           4.  Use CHG as you would any other liquid soap.  You can apply  chg directly  to the skin and wash                       Gently with a scrungie or clean washcloth.  5.  Apply the CHG Soap to your body ONLY FROM THE NECK DOWN.   Do not use on face/ open                           Wound or open sores. Avoid contact with eyes, ears mouth and genitals (private parts).                       Wash face,  Genitals (private parts) with your normal soap.             6.  Wash thoroughly, paying special attention to the area where your surgery  will be performed.  7.  Thoroughly rinse your body with warm water from the neck down.  8.  DO NOT shower/wash with your normal soap after using and rinsing off  the CHG Soap.                9.  Pat yourself dry with a clean towel.            10.  Wear clean pajamas.            11.  Place clean sheets on your bed the night of your first shower and do not  sleep with pets. Day of Surgery : Do not apply any lotions/deodorants the morning of surgery.  Please wear clean clothes to the hospital/surgery center.  FAILURE TO FOLLOW THESE INSTRUCTIONS MAY RESULT IN THE CANCELLATION OF YOUR SURGERY PATIENT SIGNATURE_________________________________  NURSE SIGNATURE__________________________________  ________________________________________________________________________

## 2015-10-20 NOTE — Telephone Encounter (Signed)
New message      Pt has a appointment on July 5 th at 8:30, if there any test or anything that needs to be done earlier than the appt please call the daughter [name]

## 2015-10-21 ENCOUNTER — Telehealth: Payer: Self-pay | Admitting: Cardiovascular Disease

## 2015-10-21 NOTE — Telephone Encounter (Signed)
New message  Pt daughter call requesting to speak with RN about pts appt on 7/5. Please call back to discuss

## 2015-10-21 NOTE — Telephone Encounter (Signed)
Called pt back and gave information/recommendations regarding upcoming appt. Pt has not been seen in 2 years. Seeing Lurena Joiner for a dental clearance for involved extraction work.  Recommended to fast day of visit if not inconvenient to patient, in case labwork recommended.' ' Caller voiced thanks and understanding.

## 2015-10-22 ENCOUNTER — Encounter (HOSPITAL_COMMUNITY)
Admission: RE | Admit: 2015-10-22 | Discharge: 2015-10-22 | Disposition: A | Discharge status: Home / Self Care | Payer: Medicare Other | Source: Ambulatory Visit | Attending: Urology | Admitting: Urology

## 2015-10-22 ENCOUNTER — Encounter (HOSPITAL_COMMUNITY): Payer: Self-pay

## 2015-10-22 ENCOUNTER — Encounter (HOSPITAL_COMMUNITY): Payer: Self-pay | Admitting: *Deleted

## 2015-10-22 DIAGNOSIS — N135 Crossing vessel and stricture of ureter without hydronephrosis: Secondary | ICD-10-CM | POA: Insufficient documentation

## 2015-10-22 DIAGNOSIS — Z01812 Encounter for preprocedural laboratory examination: Secondary | ICD-10-CM | POA: Insufficient documentation

## 2015-10-22 LAB — CBC
HCT: 34 % — ABNORMAL LOW (ref 39.0–52.0)
Hemoglobin: 11 g/dL — ABNORMAL LOW (ref 13.0–17.0)
MCH: 27.2 pg (ref 26.0–34.0)
MCHC: 32.4 g/dL (ref 30.0–36.0)
MCV: 84.2 fL (ref 78.0–100.0)
PLATELETS: 194 10*3/uL (ref 150–400)
RBC: 4.04 MIL/uL — AB (ref 4.22–5.81)
RDW: 16.1 % — ABNORMAL HIGH (ref 11.5–15.5)
WBC: 5.9 10*3/uL (ref 4.0–10.5)

## 2015-10-22 LAB — BASIC METABOLIC PANEL
Anion gap: 9 (ref 5–15)
BUN: 37 mg/dL — ABNORMAL HIGH (ref 6–20)
CHLORIDE: 105 mmol/L (ref 101–111)
CO2: 22 mmol/L (ref 22–32)
CREATININE: 1.88 mg/dL — AB (ref 0.61–1.24)
Calcium: 9.7 mg/dL (ref 8.9–10.3)
GFR, EST AFRICAN AMERICAN: 37 mL/min — AB (ref >60)
GFR, EST NON AFRICAN AMERICAN: 32 mL/min — AB (ref >60)
Glucose, Bld: 156 mg/dL — ABNORMAL HIGH (ref 65–99)
POTASSIUM: 5.6 mmol/L — AB (ref 3.5–5.1)
SODIUM: 136 mmol/L (ref 135–145)

## 2015-10-22 NOTE — Progress Notes (Addendum)
Called Triage Nurse, Mickel Baas who transferred me to [facility], Dr [name] nurse.  I left a voice mail message for her regarding abnormal BMP result with potasssium of 5.6 routed to Dr [name].  Also routed BMP results of 10/22/2015 to Dr [name] via [facility]. Gwyn at the Triage Nurse reported that Nira Conn is in office today and will received the voice mail message.

## 2015-10-22 NOTE — Progress Notes (Signed)
Requested more recent HGA1C results done at PCP be faxed to [phone].

## 2015-10-22 NOTE — Progress Notes (Signed)
HGBA1C done 09/2015 on chart.

## 2015-10-22 NOTE — Progress Notes (Signed)
Spoke with Caryl Pina at office of Dr [name] .  She stated she would send Dr [name] a message regarding the BMP results of 10/22/2015 with Potassium of 5.6.

## 2015-10-22 NOTE — Progress Notes (Signed)
EKG-02/08/15-EPIC  02/05/15- [place]  02/04/15- CXR- EPIC

## 2015-10-26 NOTE — H&P (Signed)
History of Present Illness [name] is 82 years old with the following urologic history: 1) Prostate cancer: He is s/p primary surgical with a radical prostatectomy in 2001 for pT3b N0 Mx, Gleason 3+4=7 adenocarcinoma of the prostate. He developed a biochemical recurrence in December 2006 when his PSA was noted to be 0.6. His PSA had increased slowly and was only 1.4 in January 2010 but further increased to 8.01 in November 2014. At that time, a CT scan was performed that demonstrated a retroperitoneal mass/lymphadenopathy and it was found to be recurrent B cell lymphoma. He had initially been diagnosed with lymphoma in 2006 and was treated with CHOP chemotherapy and rituximab which he stopped in 2008. After his recurrence of lymphoma, he was given options and refused a stem cell transplant and was again treated with chemotherapy and maintenance rituximab. His PSA at the time of his initial consultation with me in November 2015 was 15.83. After reviewing options for management, he and his family wished to avoid systemic therapy unless absolutely necessary. He developed measurable metastatic disease to the bone in November 2016 and bilateral ureteral obstruction due to locally advanced prostate cancer. He began systemic ADT in November 2016. Nov 2016: Began systemic ADT for measurable metastatic disease 2) Urinary retention: He has a history of urinary retention after a laparoscopic cholecystectomy in December 2014 but passed a voiding trial without problems since then.  3) Bilateral ureteral obstruction: He was incidentally noted to have left hydronephrosis on PET imaging for his lymphoma and confirmed on his bone scan imaging for prostate cancer in early 2016. No clear etiology for obstruction was noted on his imaging including no lymphadenopathy. After a discussion with his family including a discussion about renogram imaging to determine if there was obstruction present, he and his family chose to  proceed with observation and avoid further imaging as they wished to avoid any intervention (such as stenting) understanding the risk of loss of renal function that may occur. In October 2016, he was noted to have development of bilateral hydronephrosis and worsening renal function prompting cystoscopy that revealed a bladder mass as the likely cause of obstruction. He required bilateral ureteral stenting in October 2016 with stabilization of his renal function. Last stent change: Feb 2017  Past Medical History Problems  1. History of arthritis (Z87.39) 2. History of diabetes mellitus (Z86.39) 3. History of hyperlipidemia (Z86.39) 4. History of hypertension (Z86.79) 5. History of malignant lymphoma (Z85.72) 6. History of sleep apnea (Z86.69) Surgical History Problems  1. History of Ankle Repair 2. History of Cystoscopy With Fulguration Small Lesion (5-49mm) 3. History of Cystoscopy With Insertion Of Ureteral Stent Bilateral 4. History of Cystoscopy With Insertion Of Ureteral Stent Bilateral 5. History of Kidney Surgery 6. History of Prostatectomy Retropubic 7. History of Shoulder Surgery Current Meds 1. Allopurinol 300 MG Oral Tablet; Therapy: [id number] to Recorded 2. AmLODIPine Besylate 5 MG Oral Tablet; Therapy: [id number] to Recorded 3. Atorvastatin Calcium 20 MG Oral Tablet; Therapy: (Recorded:15Dec2014) to Recorded 4. Besivance 0.6 % Ophthalmic Suspension; Therapy: [id number] to Recorded 5. Calcium + D3 600-200 MG-UNIT Oral Tablet; Therapy: (Recorded:06Jan2017) to Recorded 6. Cefuroxime Axetil 250 MG Oral Tablet; Therapy: [phone] to Recorded 7. Cetirizine HCl - 10 MG Oral Tablet; Therapy: (Recorded:15Dec2014) to Recorded 8. Cholestyramine 4 GM/DOSE Oral Powder; Therapy: [id number] to Recorded 9. Durezol 0.05 % Ophthalmic Emulsion; Therapy: 18Apr2016 to Recorded 10. Fenofibrate 160 MG Oral Tablet; Therapy: (Recorded:15Dec2014) to Recorded 11. Gabapentin 300 MG Oral  Capsule; Therapy: (Recorded:15Dec2014) to Recorded 12.  HumuLIN N 100 UNIT/ML Subcutaneous Suspension; Therapy: [id number] to Recorded 13. Hydrocodone-Acetaminophen 5-325 MG Oral Tablet; Therapy: [phone] to Recorded 14. Magnesium Oxide TABS; Therapy: (Recorded:09Oct2015) to Recorded 15. MetFORMIN HCl - 1000 MG Oral Tablet; Therapy: (Recorded:15Dec2014) to Recorded 16. Multi-Vitamin TABS; Therapy: (Recorded:15Dec2014) to Recorded 17. Questran 4 GM/DOSE Oral Powder; Therapy: (Recorded:05Oct2016) to Recorded 18. Sertraline HCl - 100 MG Oral Tablet; Therapy: (Recorded:15Dec2014) to Recorded 19. Toujeo SoloStar 300 UNIT/ML Subcutaneous Solution Pen-injector; Therapy: [id number] to Recorded 20. Vitamin B-12 TABS; Therapy: (Recorded:15Dec2014) to Recorded 21. Vitamin C TABS; Therapy: (Recorded:09Oct2015) to Recorded 22. Vitamin D TABS; Therapy: (Recorded:07Apr2015) to Recorded 23. Xarelto 15 MG Oral Tablet; Therapy: [id number] to Recorded 24. Zetia 10 MG Oral Tablet; Therapy: (Recorded:15Dec2014) to Recorded Allergies Medication  1. Niacin ER CPCR Family History Problems  1. Family history of cardiac disorder (Z82.49) : Father 2. Family history of diabetes mellitus (Z83.3) : Mother Social History Problems  1. Former smoker [phone]) 2. Married 3. Non-smoker (Z78.9) 4. Retired Engineer, site  Weight: 180 lb  BMI Calculated: 27.37 BSA Calculated: 1.95  Physical Exam Constitutional: Well nourished and well developed . No acute distress.  ENT:. The ears and nose are normal in appearance.  Neck: The appearance of the neck is normal and no neck mass is present.  Pulmonary: No respiratory distress and normal respiratory rhythm and effort.  Cardiovascular: Heart rate and rhythm are normal . No peripheral edema.  Abdomen: The abdomen is soft and nontender. No masses are palpated. mild right CVA tenderness. No hernias are palpable. No hepatosplenomegaly noted.  Skin: Normal skin turgor, no  visible rash and no visible skin lesions.  Neuro/Psych:. Mood and affect are appropriate.  Results/Data Urine [Data Includes: Last 1 Day]   [id number]  COLOR OTHER   APPEARANCE CLOUDY   SPECIFIC GRAVITY 1.020   pH 6.0   GLUCOSE 1+   BILIRUBIN NEGATIVE   KETONE NEGATIVE   BLOOD 3+   PROTEIN 2+   NITRITE NEGATIVE   LEUKOCYTE ESTERASE NEGATIVE   SQUAMOUS EPITHELIAL/HPF 0-5 HPF  WBC 0-5 WBC/HPF  RBC >60 RBC/HPF  BACTERIA NONE SEEN HPF  CRYSTALS NONE SEEN HPF  CASTS NONE SEEN LPF  Yeast NONE SEEN HPF  PSA Flowsheet [Data Includes: All]  ** Printed in Appendix #1 below.  Selected Results  PSA [id number] 08:45AM [name]  SPECIMEN TYPE: BLOOD   Test Name Result Flag Reference  PSA 0.22 ng/mL  <=4.00  TEST METHODOLOGY: ECLIA PSA (ELECTROCHEMILUMINESCENCE IMMUNOASSAY)   TESTOSTERONE [id number] 08:45AM [name]  SPECIMEN TYPE: BLOOD   Test Name Result Flag Reference  TESTOSTERONE, TOTAL 36 ng/dL L 250-827  IN HYPOGONADAL MALES, TESTOSTERONE, TOTAL,LC/MS/MS IS THE RECOMMENDED ASSAY DUE TO THE DIMINISHED ACCURACY OF IMMUNOASSAY AT LEVELS BELOW 250 NG/DL. THIS TEST CODE (38756) MUST BE COLLECTED IN A RED-TOP TUBE WITH NO GEL. TWO MORNING (8 - 10 AM) SPECIMENS OBTAINED ON DIFFERENT DAYS ARE RECOMMENDED BY THE ENDOCRINE SOCIETY FOR SCREENING FOR HYPOGONADISM.  Assessment Assessed  1. Adenocarcinoma of prostate (C61) 2. Ureteral obstruction (N13.5)  Discussion/Summary 1. Metastatic prostate cancer: He continues ADT. 2. Bilateral ureteral obstruction: His last stent was change in early February. He will proceed with cystoscopy and bilateral ureteral stent change.  I discussed the potential benefits and risks of the procedure, side effects of the proposed treatment, the likelihood of the patient achieving the goals of the procedure, and any potential problems that might occur during the procedure or recuperation.

## 2015-10-28 ENCOUNTER — Ambulatory Visit (INDEPENDENT_AMBULATORY_CARE_PROVIDER_SITE_OTHER): Payer: Medicare Other | Admitting: Cardiology

## 2015-10-28 ENCOUNTER — Encounter: Payer: Self-pay | Admitting: Cardiology

## 2015-10-28 VITALS — BP 128/62 | HR 67 | Ht 66.0 in | Wt 177.4 lb

## 2015-10-28 DIAGNOSIS — I480 Paroxysmal atrial fibrillation: Secondary | ICD-10-CM | POA: Diagnosis not present

## 2015-10-28 DIAGNOSIS — N183 Chronic kidney disease, stage 3 unspecified: Secondary | ICD-10-CM

## 2015-10-28 DIAGNOSIS — C8580 Other specified types of non-Hodgkin lymphoma, unspecified site: Secondary | ICD-10-CM

## 2015-10-28 DIAGNOSIS — Z7189 Other specified counseling: Secondary | ICD-10-CM | POA: Insufficient documentation

## 2015-10-28 DIAGNOSIS — C61 Malignant neoplasm of prostate: Secondary | ICD-10-CM

## 2015-10-28 DIAGNOSIS — E0821 Diabetes mellitus due to underlying condition with diabetic nephropathy: Secondary | ICD-10-CM | POA: Diagnosis not present

## 2015-10-28 DIAGNOSIS — I2510 Atherosclerotic heart disease of native coronary artery without angina pectoris: Secondary | ICD-10-CM

## 2015-10-28 DIAGNOSIS — Z7901 Long term (current) use of anticoagulants: Secondary | ICD-10-CM | POA: Insufficient documentation

## 2015-10-28 DIAGNOSIS — Z01810 Encounter for preprocedural cardiovascular examination: Secondary | ICD-10-CM

## 2015-10-28 NOTE — Assessment & Plan Note (Signed)
Xarelto

## 2015-10-28 NOTE — Assessment & Plan Note (Signed)
Type 2 DM with CRI-3

## 2015-10-28 NOTE — Anesthesia Preprocedure Evaluation (Addendum)
Anesthesia Evaluation  Patient identified by MRN, date of birth, ID band Patient awake    Reviewed: Allergy & Precautions, NPO status , Patient's Chart, lab work & pertinent test results  History of Anesthesia Complications Negative for: history of anesthetic complications  Airway Mallampati: II  TM Distance: >3 FB Neck ROM: Full    Dental  (+) Poor Dentition, Missing, Dental Advisory Given   Pulmonary sleep apnea , former smoker,    Pulmonary exam normal        Cardiovascular hypertension, + CAD  Normal cardiovascular exam     Neuro/Psych negative neurological ROS  negative psych ROS   GI/Hepatic Neg liver ROS, GERD  ,  Endo/Other  diabetes  Renal/GU      Musculoskeletal   Abdominal   Peds  Hematology   Anesthesia Other Findings   Reproductive/Obstetrics                            Anesthesia Physical Anesthesia Plan  ASA: III  Anesthesia Plan: General   Post-op Pain Management:    Induction: Intravenous  Airway Management Planned: LMA  Additional Equipment:   Intra-op Plan:   Post-operative Plan: Extubation in OR  Informed Consent: I have reviewed the patients History and Physical, chart, labs and discussed the procedure including the risks, benefits and alternatives for the proposed anesthesia with the patient or authorized representative who has indicated his/her understanding and acceptance.   Dental advisory given  Plan Discussed with: CRNA and Anesthesiologist  Anesthesia Plan Comments:        Anesthesia Quick Evaluation

## 2015-10-28 NOTE — Assessment & Plan Note (Signed)
50% LAD, 80% PL on remote cath. No recent angina. Normal LVF by echo Oct 2016

## 2015-10-28 NOTE — Assessment & Plan Note (Signed)
With obstruction- followed by Dr [name]

## 2015-10-28 NOTE — Progress Notes (Signed)
10/28/2015 [name]   1932-04-20  [id number]  Primary Physician Horatio Pel, MD Primary Cardiologist: Dr [name]  HPI:  82 y/o male followed by Dr [name] with paroxysmal atrial fibrillation. He has non Hodgkin's Lymphoma followed by Dr Jana Hakim and prostate cancer followed by Dr [name]. He is on chronic anticoagulation without any bleeding problems. He has obstructive sleep apnea and uses CPAP. He has known moderate coronary artery disease on remote cath with a 50% lesion in his LAD and 80% lesion in his posterolateral artery and heavy coronary calcification. This is asymptomatic. He had a normal nuclear perfusion study in 2007. Has normal left ventricular systolic function, EF [id number] and a moderately dilated left atrium by echo in December 2014. He has insulin requiring type 2 diabetes mellitus and severe mixed hyperlipidemia on statin/fenofibrate/Zetia therapy.           He has had problems with dental infections and had to have some teeth pulled on a somewhat urgent basis. He tolerated this well. He held his Xarelto before this and is now back on it. He is here today for pre op cardiovascular clearance prior to having the remainder of his teeth pulled. Cardiovascular status has been stable. He works outside in his garden twice a day. He denies any chest pain or NTG use. He is in NSR in the office today.    Current Outpatient Prescriptions  Medication Sig Dispense Refill  . allopurinol (ZYLOPRIM) 300 MG tablet Take 1 tablet (300 mg total) by mouth daily. 90 tablet 0  . amLODipine (NORVASC) 5 MG tablet Take 2.5 mg by mouth daily.     Marland Kitchen atorvastatin (LIPITOR) 20 MG tablet Take 20 mg by mouth at bedtime.    . Calcium-Magnesium-Vitamin D (CALCIUM 1200+D3 PO) Take 2 tablets by mouth daily.     . cetirizine (ZYRTEC) 10 MG tablet Take 10 mg by mouth daily.    . cholestyramine (QUESTRAN) 4 g packet Take 4 g by mouth 3 (three) times daily with meals.    . Cyanocobalamin (VITAMIN  B 12 PO) Take 2,500 mg by mouth daily.     . Diclofenac Sodium CR (VOLTAREN-XR) 100 MG 24 hr tablet Take 1 tablet (100 mg total) by mouth daily. 7 tablet 0  . fenofibrate 160 MG tablet Take 160 mg by mouth at bedtime.    . gabapentin (NEURONTIN) 300 MG capsule Take 300 mg by mouth 3 (three) times daily.     Marland Kitchen lidocaine-prilocaine (EMLA) cream Apply 1 application topically as needed. (Patient taking differently: Apply 1 application topically daily as needed (port numbing). ) 30 g 3  . magnesium oxide (MAG-OX) 400 (241.3 MG) MG tablet Take 400 tablets by mouth 2 (two) times daily.   4  . metFORMIN (GLUCOPHAGE) 1000 MG tablet Take 1,000 mg by mouth 2 (two) times daily with a meal.     . Multiple Vitamins-Minerals (MULTIVITAMIN WITH MINERALS) tablet Take 1 tablet by mouth daily.    Marland Kitchen oxyCODONE-acetaminophen (PERCOCET) 5-325 MG tablet Take 1-2 tablets by mouth every 6 (six) hours as needed (for pain; may cause constipation). 20 tablet 0  . Rivaroxaban (XARELTO) 15 MG TABS tablet Take 15 mg by mouth every evening.     . sertraline (ZOLOFT) 100 MG tablet Take 100 mg by mouth daily.    Nelva Nay SOLOSTAR 300 UNIT/ML SOPN INJECT 60 UNITS SUB-Q ONCE DAILY  5  . traMADol (ULTRAM) 50 MG tablet Take 1 tablet (50 mg total) by mouth every 6 (  six) hours as needed for severe pain. 9 tablet 0  . vitamin C (ASCORBIC ACID) 500 MG tablet Take 500 mg by mouth daily.     No current facility-administered medications for this visit.    Allergies  Allergen Reactions  . Atenolol Other (See Comments)    Slowed heart rate.   . Niacin Other (See Comments)    headaches    Social History   Social History  . Marital Status: Widowed    Spouse Name: N/A  . Number of Children: 3  . Years of Education: 9th   Occupational History  . Retired    Social History Main Topics  . Smoking status: Former Smoker    Types: Pipe, Landscape architect  . Smokeless tobacco: Former Systems developer    Types: Chew     Comment: [place] YEARS AGO  "  . Alcohol Use: No  . Drug Use: No  . Sexual Activity: No   Other Topics Concern  . Not on file   Social History Narrative     Review of Systems: General: negative for chills, fever, night sweats or weight changes.  Cardiovascular: negative for chest pain, dyspnea on exertion, edema, orthopnea, palpitations, paroxysmal nocturnal dyspnea or shortness of breath Dermatological: negative for rash Respiratory: negative for cough or wheezing Urologic: he has outlet obstruction and will require stenting Abdominal: negative for nausea, vomiting, diarrhea, bright red blood per rectum, melena, or hematemesis Neurologic: negative for visual changes, syncope, or dizziness All other systems reviewed and are otherwise negative except as noted above.    Blood pressure 128/62, pulse 67, height 5\' 6"  (1.676 m), weight 177 lb 6.4 oz (80.468 kg).  General appearance: alert, cooperative, no distress and poor dentition Neck: no carotid bruit and no JVD Lungs: few basilar crackles Heart: regular rate and rhythm Abdomen: soft, non-tender; bowel sounds normal; no masses,  no organomegaly Extremities: no edema Skin: pale cool dry Neurologic: Grossly normal  EKG NSR  ASSESSMENT AND PLAN:   Pre-operative cardiovascular examination Pre op clearance for dental extraction  Paroxysmal atrial fibrillation (HCC) PAF with SSS component- (no rate lower agents) - currently NSR He is not on a beta blocker because he had post conversion pauses in the past.   CAD (coronary artery disease) 50% LAD, 80% PL on remote cath. No recent angina. Normal LVF by echo Oct 2016  Diabetes mellitus with renal manifestations, controlled ([place]) Type 2 DM with CRI-3  CKD (chronic kidney disease) stage 3, GFR 30-59 ml/min Last SCr 1.8  Lymphoma malignant, large cell Followed by Dr Jana Hakim  Prostate cancer [facility]) With obstruction- followed by Dr [name]  Chronic anticoagulation Xarelto   PLAN  He is an  acceptable risk from a cardiovascular standpoint for dental extraction. OK to hold Xarelto 48 hrs pre op and resume ASAP post op.    Kerin Ransom PA-C 10/28/2015 9:43 AM

## 2015-10-28 NOTE — Assessment & Plan Note (Signed)
Followed by Dr Jana Hakim

## 2015-10-28 NOTE — Assessment & Plan Note (Signed)
Last SCr 1.8

## 2015-10-28 NOTE — Patient Instructions (Signed)
Medication Instructions:  Continue current medication  Labwork: NONE  Testing/Procedures: NONE  Follow-Up: Your physician wants you to follow-up in: 6 Months with Dr [name]. You will receive a reminder letter in the mail two months in advance. If you don't receive a letter, please call our office to schedule the follow-up appointment.   Any Other Special Instructions Will Be Listed Below (If Applicable).  Ok to hold Xarelto 2 days prior to Surgery and resume soon after.   If you need a refill on your cardiac medications before your next appointment, please call your pharmacy.

## 2015-10-28 NOTE — Assessment & Plan Note (Signed)
Pre op clearance for dental extraction

## 2015-10-28 NOTE — Assessment & Plan Note (Signed)
PAF with SSS component- (no rate lower agents) - currently NSR

## 2015-10-29 ENCOUNTER — Ambulatory Visit (HOSPITAL_COMMUNITY): Payer: Medicare Other | Admitting: Anesthesiology

## 2015-10-29 ENCOUNTER — Ambulatory Visit: Payer: Medicare Other | Admitting: Oncology

## 2015-10-29 ENCOUNTER — Encounter (HOSPITAL_COMMUNITY): Payer: Self-pay

## 2015-10-29 ENCOUNTER — Encounter (HOSPITAL_COMMUNITY)
Admission: RE | Disposition: A | Discharge status: Home / Self Care | Payer: Self-pay | Source: Ambulatory Visit | Attending: Urology

## 2015-10-29 ENCOUNTER — Ambulatory Visit (HOSPITAL_COMMUNITY)
Admission: RE | Admit: 2015-10-29 | Discharge: 2015-10-29 | Disposition: A | Discharge status: Home / Self Care | Payer: Medicare Other | Source: Ambulatory Visit | Attending: Urology | Admitting: Urology

## 2015-10-29 DIAGNOSIS — Z79899 Other long term (current) drug therapy: Secondary | ICD-10-CM | POA: Insufficient documentation

## 2015-10-29 DIAGNOSIS — E785 Hyperlipidemia, unspecified: Secondary | ICD-10-CM | POA: Insufficient documentation

## 2015-10-29 DIAGNOSIS — C799 Secondary malignant neoplasm of unspecified site: Secondary | ICD-10-CM | POA: Diagnosis not present

## 2015-10-29 DIAGNOSIS — Z794 Long term (current) use of insulin: Secondary | ICD-10-CM | POA: Diagnosis not present

## 2015-10-29 DIAGNOSIS — E119 Type 2 diabetes mellitus without complications: Secondary | ICD-10-CM | POA: Insufficient documentation

## 2015-10-29 DIAGNOSIS — Z87891 Personal history of nicotine dependence: Secondary | ICD-10-CM | POA: Diagnosis not present

## 2015-10-29 DIAGNOSIS — Z7901 Long term (current) use of anticoagulants: Secondary | ICD-10-CM | POA: Diagnosis not present

## 2015-10-29 DIAGNOSIS — I2510 Atherosclerotic heart disease of native coronary artery without angina pectoris: Secondary | ICD-10-CM | POA: Diagnosis not present

## 2015-10-29 DIAGNOSIS — Z466 Encounter for fitting and adjustment of urinary device: Secondary | ICD-10-CM | POA: Diagnosis not present

## 2015-10-29 DIAGNOSIS — Z8572 Personal history of non-Hodgkin lymphomas: Secondary | ICD-10-CM | POA: Diagnosis not present

## 2015-10-29 DIAGNOSIS — Z8546 Personal history of malignant neoplasm of prostate: Secondary | ICD-10-CM | POA: Insufficient documentation

## 2015-10-29 DIAGNOSIS — I10 Essential (primary) hypertension: Secondary | ICD-10-CM | POA: Insufficient documentation

## 2015-10-29 DIAGNOSIS — N135 Crossing vessel and stricture of ureter without hydronephrosis: Secondary | ICD-10-CM | POA: Insufficient documentation

## 2015-10-29 DIAGNOSIS — Z9079 Acquired absence of other genital organ(s): Secondary | ICD-10-CM | POA: Diagnosis not present

## 2015-10-29 DIAGNOSIS — Z9221 Personal history of antineoplastic chemotherapy: Secondary | ICD-10-CM | POA: Diagnosis not present

## 2015-10-29 DIAGNOSIS — G4730 Sleep apnea, unspecified: Secondary | ICD-10-CM | POA: Insufficient documentation

## 2015-10-29 HISTORY — PX: CYSTOSCOPY W/ URETERAL STENT PLACEMENT: SHX1429

## 2015-10-29 HISTORY — DX: Unspecified atrial fibrillation: I48.91

## 2015-10-29 LAB — BASIC METABOLIC PANEL
Anion gap: 5 (ref 5–15)
BUN: 24 mg/dL — AB (ref 6–20)
CALCIUM: 9.8 mg/dL (ref 8.9–10.3)
CO2: 26 mmol/L (ref 22–32)
CREATININE: 1.54 mg/dL — AB (ref 0.61–1.24)
Chloride: 106 mmol/L (ref 101–111)
GFR calc Af Amer: 47 mL/min — ABNORMAL LOW (ref >60)
GFR, EST NON AFRICAN AMERICAN: 41 mL/min — AB (ref >60)
Glucose, Bld: 111 mg/dL — ABNORMAL HIGH (ref 65–99)
Potassium: 4.2 mmol/L (ref 3.5–5.1)
SODIUM: 137 mmol/L (ref 135–145)

## 2015-10-29 LAB — GLUCOSE, CAPILLARY
GLUCOSE-CAPILLARY: 100 mg/dL — AB (ref 65–99)
Glucose-Capillary: 110 mg/dL — ABNORMAL HIGH (ref 65–99)

## 2015-10-29 LAB — PROTIME-INR
INR: 1.69 — ABNORMAL HIGH (ref 0.00–1.49)
PROTHROMBIN TIME: 19.9 s — AB (ref 11.6–15.2)

## 2015-10-29 SURGERY — CYSTOSCOPY, FLEXIBLE, WITH STENT REPLACEMENT
Anesthesia: General | Laterality: Bilateral

## 2015-10-29 MED ORDER — EPHEDRINE SULFATE 50 MG/ML IJ SOLN
INTRAMUSCULAR | Status: DC | PRN
  Administered 2015-10-29 (×2): 5 mg via INTRAVENOUS

## 2015-10-29 MED ORDER — PROPOFOL 10 MG/ML IV BOLUS
INTRAVENOUS | Status: AC
  Filled 2015-10-29: qty 20

## 2015-10-29 MED ORDER — PROMETHAZINE HCL 25 MG/ML IJ SOLN: 6.2500 mg | INTRAMUSCULAR | Status: DC | PRN

## 2015-10-29 MED ORDER — CEFAZOLIN SODIUM-DEXTROSE 2-4 GM/100ML-% IV SOLN
2.0000 g | INTRAVENOUS | Status: AC
  Administered 2015-10-29: 2 g via INTRAVENOUS
  Filled 2015-10-29: qty 100

## 2015-10-29 MED ORDER — LACTATED RINGERS IV SOLN: INTRAVENOUS | Status: DC

## 2015-10-29 MED ORDER — FENTANYL CITRATE (PF) 100 MCG/2ML IJ SOLN
INTRAMUSCULAR | Status: DC | PRN
  Administered 2015-10-29: 25 ug via INTRAVENOUS

## 2015-10-29 MED ORDER — ONDANSETRON HCL 4 MG/2ML IJ SOLN
INTRAMUSCULAR | Status: AC
  Filled 2015-10-29: qty 2

## 2015-10-29 MED ORDER — LACTATED RINGERS IV SOLN
INTRAVENOUS | Status: DC | PRN
  Administered 2015-10-29: 07:00:00 via INTRAVENOUS

## 2015-10-29 MED ORDER — HYDROMORPHONE HCL 1 MG/ML IJ SOLN: 0.2500 mg | INTRAMUSCULAR | Status: DC | PRN

## 2015-10-29 MED ORDER — FENTANYL CITRATE (PF) 100 MCG/2ML IJ SOLN
INTRAMUSCULAR | Status: AC
  Filled 2015-10-29: qty 2

## 2015-10-29 MED ORDER — ONDANSETRON HCL 4 MG/2ML IJ SOLN
INTRAMUSCULAR | Status: DC | PRN
  Administered 2015-10-29: 4 mg via INTRAVENOUS

## 2015-10-29 MED ORDER — PROPOFOL 10 MG/ML IV BOLUS
INTRAVENOUS | Status: DC | PRN
  Administered 2015-10-29: 100 mg via INTRAVENOUS

## 2015-10-29 MED ORDER — SODIUM CHLORIDE 0.9 % IJ SOLN
INTRAMUSCULAR | Status: AC
  Filled 2015-10-29: qty 10

## 2015-10-29 MED ORDER — STERILE WATER FOR IRRIGATION IR SOLN
Status: DC | PRN
  Administered 2015-10-29: 3000 mL

## 2015-10-29 MED ORDER — CEFAZOLIN SODIUM-DEXTROSE 2-4 GM/100ML-% IV SOLN
INTRAVENOUS | Status: AC
  Filled 2015-10-29: qty 100

## 2015-10-29 MED ORDER — EPHEDRINE SULFATE 50 MG/ML IJ SOLN
INTRAMUSCULAR | Status: AC
  Filled 2015-10-29: qty 1

## 2015-10-29 SURGICAL SUPPLY — 11 items
BAG URO CATCHER STRL LF (MISCELLANEOUS) ×3 IMPLANT
CATH INTERMIT  6FR 70CM (CATHETERS) IMPLANT
CLOTH BEACON ORANGE TIMEOUT ST (SAFETY) ×3 IMPLANT
GLOVE BIOGEL M STRL SZ7.5 (GLOVE) ×3 IMPLANT
GOWN STRL REUS W/TWL LRG LVL3 (GOWN DISPOSABLE) ×6 IMPLANT
GUIDEWIRE STR DUAL SENSOR (WIRE) ×3 IMPLANT
MANIFOLD NEPTUNE II (INSTRUMENTS) ×3 IMPLANT
PACK CYSTO (CUSTOM PROCEDURE TRAY) ×3 IMPLANT
STENT URO INLAY 6FRX24CM (STENTS) ×6 IMPLANT
TUBING CONNECTING 10 (TUBING) ×2 IMPLANT
TUBING CONNECTING 10' (TUBING) ×1

## 2015-10-29 NOTE — Progress Notes (Signed)
O.K. For patient to go to Short Stay now- per Dr. Tobias Alexander

## 2015-10-29 NOTE — Transfer of Care (Signed)
Immediate Anesthesia Transfer of Care Note  Patient: [name]  Procedure(s) Performed: Procedure(s): CYSTOSCOPY WITH BILATERAL STENT REPLACEMENT (Bilateral)  Patient Location: PACU  Anesthesia Type:General  Level of Consciousness:  sedated, patient cooperative and responds to stimulation  Airway & Oxygen Therapy:Patient Spontanous Breathing and Patient connected to face mask oxgen  Post-op Assessment:  Report given to PACU RN and Post -op Vital signs reviewed and stable  Post vital signs:  Reviewed and stable  Last Vitals:  Filed Vitals:   10/29/15 0559  BP: 127/56  Pulse: 63  Temp: 36.6 C  Resp: 18    Complications: No apparent anesthesia complications

## 2015-10-29 NOTE — Discharge Instructions (Addendum)
1. You may see some blood in the urine and may have some burning with urination for 48-72 hours. You also may notice that you have to urinate more frequently or urgently after your procedure which is normal.  2. You should call should you develop an inability urinate, fever > 101, persistent nausea and vomiting that prevents you from eating or drinking to stay hydrated.  3. If you have a stent, you will likely urinate more frequently and urgently until the stent is removed and you may experience some discomfort/pain in the lower abdomen and flank especially when urinating. You may take pain medication prescribed to you if needed for pain. You may also intermittently have blood in the urine until the stent is removed.     General Anesthesia, Adult, Care After Refer to this sheet in the next few weeks. These instructions provide you with information on caring for yourself after your procedure. Your health care provider may also give you more specific instructions. Your treatment has been planned according to current medical practices, but problems sometimes occur. Call your health care provider if you have any problems or questions after your procedure. WHAT TO EXPECT AFTER THE PROCEDURE After the procedure, it is typical to experience:  Sleepiness.  Nausea and vomiting. HOME CARE INSTRUCTIONS  For the first 24 hours after general anesthesia:  Have a responsible person with you.  Do not drive a car. If you are alone, do not take public transportation.  Do not drink alcohol.  Do not take medicine that has not been prescribed by your health care provider.  Do not sign important papers or make important decisions.  You may resume a normal diet and activities as directed by your health care provider.   If you have questions or problems that seem related to general anesthesia, call the hospital and ask for the anesthetist or anesthesiologist on call. SEEK MEDICAL CARE IF:  You have nausea  and vomiting that continue the day after anesthesia.  You develop a rash. SEEK IMMEDIATE MEDICAL CARE IF:   You have difficulty breathing.  You have chest pain.  You have any allergic problems.   This information is not intended to replace advice given to you by your health care provider. Make sure you discuss any questions you have with your health care provider.   Document Released: 07/18/2000 Document Revised: 05/02/2014 Document Reviewed: 08/10/2011 Elsevier Interactive Patient Education [employer].

## 2015-10-29 NOTE — Anesthesia Postprocedure Evaluation (Signed)
Anesthesia Post Note  Patient: [name]  Procedure(s) Performed: Procedure(s) (LRB): CYSTOSCOPY WITH BILATERAL STENT REPLACEMENT (Bilateral)  Patient location during evaluation: PACU Anesthesia Type: MAC Level of consciousness: awake and alert Pain management: pain level controlled Vital Signs Assessment: post-procedure vital signs reviewed and stable Respiratory status: spontaneous breathing and respiratory function stable Cardiovascular status: stable Anesthetic complications: no    Last Vitals:  Filed Vitals:   10/29/15 0756 10/29/15 0800  BP: 113/63 117/62  Pulse: 62 61  Temp: 36.4 C   Resp: 12 13    Last Pain:  Filed Vitals:   10/29/15 0828  PainSc: 0-No pain                 Trysten Berti DANIEL

## 2015-10-29 NOTE — Op Note (Signed)
Preoperative diagnosis:  1. Bilateral ureteral stent change 2. Metastatic prostate cancer   Postoperative diagnosis:  1. Bilateral ureteral stent change 2. Metastatic prostate cancer   Procedure:  1. Cystoscopy 2. Bilateral ureteral stent placement (6 x 24 Bard Inlay Optima)  Surgeon: Roxy Horseman, Brooke Bonito. M.D.  Anesthesia: General  Complications: None  Intraoperative findings: Both indwelling Contour stents were heavily encrusted but able to be exchanged without difficulty.  EBL: Minimal  Specimens: None  Indication: [name] is a 82 y.o. patient with bilateral ureteral obstruction. After reviewing the management options for treatment, he elected to proceed with the above surgical procedure(s). We have discussed the potential benefits and risks of the procedure, side effects of the proposed treatment, the likelihood of the patient achieving the goals of the procedure, and any potential problems that might occur during the procedure or recuperation. Informed consent has been obtained.  Description of procedure:  The patient was taken to the operating room and general anesthesia was induced.  The patient was placed in the dorsal lithotomy position, prepped and draped in the usual sterile fashion, and preoperative antibiotics were administered. A preoperative time-out was performed.   Cystourethroscopy was performed.  The patient's urethra was examined and was unremarkable. The bladder was then systematically examined in its entirety. There was no evidence for any bladder tumors, stones, or other mucosal pathology.    Attention then turned to the left ureteral orifice and the patient's indwelling ureteral stent was identified and brought out to the urethral meatus with the flexible graspers.  A 0.38 sensor guidewire was then advanced up the left ureter into the renal pelvis under fluoroscopic guidance.  The wire was then backloaded through the cystoscope and a ureteral stent  was advance over the wire using Seldinger technique.  The stent was positioned appropriately under fluoroscopic and cystoscopic guidance.  The wire was then removed with an adequate stent curl noted in the renal pelvis as well as in the bladder.  Attention then turned to the right ureteral orifice and the patient's indwelling ureteral stent was identified and brought out to the urethral meatus with the flexible graspers.  A 0.38 sensor guidewire was then advanced up the right ureter into the renal pelvis under fluoroscopic guidance.  The wire was then backloaded through the cystoscope and a ureteral stent was advance over the wire using Seldinger technique.  The stent was positioned appropriately under fluoroscopic and cystoscopic guidance.  The wire was then removed with an adequate stent curl noted in the renal pelvis as well as in the bladder.  The bladder was then emptied and the procedure ended.  The patient appeared to tolerate the procedure well and without complications.  The patient was able to be awakened and transferred to the recovery unit in satisfactory condition.    Pryor Curia MD

## 2015-10-29 NOTE — Anesthesia Procedure Notes (Signed)
Procedure Name: LMA Insertion Date/Time: 10/29/2015 7:28 AM Performed by: Anne Fu Pre-anesthesia Checklist: Patient identified, Emergency Drugs available, Suction available, Patient being monitored and Timeout performed Patient Re-evaluated:Patient Re-evaluated prior to inductionOxygen Delivery Method: Circle system utilized Preoxygenation: Pre-oxygenation with 100% oxygen Intubation Type: IV induction Ventilation: Mask ventilation without difficulty LMA: LMA inserted LMA Size: 4.0 Number of attempts: 1 Placement Confirmation: positive ETCO2 and breath sounds checked- equal and bilateral Tube secured with: Tape

## 2015-10-29 NOTE — Progress Notes (Signed)
Asst up to void. Tolerated well. Denies dizziness. Was Steady on feet

## 2015-11-03 ENCOUNTER — Telehealth: Payer: Self-pay | Admitting: *Deleted

## 2015-11-05 ENCOUNTER — Telehealth: Payer: Self-pay | Admitting: *Deleted

## 2015-11-05 NOTE — Telephone Encounter (Signed)
Message left by pt's daughter, [name] , stating need to coordinate next Rituxin dose last dose given in April - with pt on maintanance dosing q 3 months.  Rituxin scheduled for this month postponed to August 11- due to dental extractions.  Boysie needs additional teeth extracted which has been schedule for August 11 as well.  [name] is requesting for best date for Rituxin per above extraction date.  Return call number for [name] is [phone]

## 2015-11-06 ENCOUNTER — Telehealth: Payer: Self-pay | Admitting: Oncology

## 2015-11-06 ENCOUNTER — Other Ambulatory Visit: Payer: Self-pay

## 2015-11-06 NOTE — Telephone Encounter (Signed)
lvm for [name] about r/s apptd on 8/30 at 845 am per pof

## 2015-11-16 ENCOUNTER — Other Ambulatory Visit: Payer: Self-pay | Admitting: Oncology

## 2015-11-24 ENCOUNTER — Telehealth: Payer: Self-pay

## 2015-11-24 DIAGNOSIS — C8580 Other specified types of non-Hodgkin lymphoma, unspecified site: Secondary | ICD-10-CM

## 2015-11-24 NOTE — Telephone Encounter (Signed)
[name] is calling for written consent for cleaning of teeth. Val gave her oral consent per Dr Hayden Rasmussen but dentist requires written consent. Also they need an Rx for antibiotic prior to cleaning per Dr Jana Hakim. The dentist is saying Dr Jana Hakim needs to write the antibiotic Rx. The dentist is trying to work him in on a cancellation appt between now and Aug 10.  On Aug 11 he is to have upper right teeth extractions. Does he need another dose of antibiotics before the extractions?  Dr Eula Listen DDS fax # [phone]

## 2015-11-25 MED ORDER — AMOXICILLIN 500 MG PO TABS: 500.0000 mg | ORAL_TABLET | Freq: Two times a day (BID) | ORAL | 0 refills | Status: DC

## 2015-11-25 NOTE — Progress Notes (Addendum)
Letter dated 11/24/15 not sent.    Letter dated 11/25/15 faxed to Dr. Kenton Kingfisher.  Amoxicillin sent to [facility].  Pt daughter notified and voiced understanding.

## 2015-11-25 NOTE — Addendum Note (Signed)
Addended by: Prentiss Bells on: 11/25/2015 05:21 PM   Modules accepted: Orders

## 2015-11-27 ENCOUNTER — Other Ambulatory Visit: Payer: Medicare Other

## 2015-11-27 ENCOUNTER — Ambulatory Visit: Payer: Medicare Other

## 2015-12-04 ENCOUNTER — Ambulatory Visit: Payer: Medicare Other

## 2015-12-04 ENCOUNTER — Ambulatory Visit: Payer: Medicare Other | Admitting: Oncology

## 2015-12-04 ENCOUNTER — Other Ambulatory Visit: Payer: Medicare Other

## 2015-12-16 ENCOUNTER — Other Ambulatory Visit: Payer: Self-pay | Admitting: *Deleted

## 2015-12-17 ENCOUNTER — Other Ambulatory Visit: Payer: Self-pay | Admitting: *Deleted

## 2015-12-17 DIAGNOSIS — C8580 Other specified types of non-Hodgkin lymphoma, unspecified site: Secondary | ICD-10-CM

## 2015-12-17 MED ORDER — AMOXICILLIN 500 MG PO TABS: 500.0000 mg | ORAL_TABLET | Freq: Two times a day (BID) | ORAL | 0 refills | Status: DC

## 2015-12-23 ENCOUNTER — Ambulatory Visit: Payer: Medicare Other | Admitting: Oncology

## 2015-12-23 ENCOUNTER — Other Ambulatory Visit: Payer: Medicare Other

## 2015-12-23 ENCOUNTER — Ambulatory Visit: Payer: Medicare Other

## 2016-01-01 ENCOUNTER — Telehealth: Payer: Self-pay | Admitting: Oncology

## 2016-01-01 ENCOUNTER — Telehealth: Payer: Self-pay

## 2016-01-01 ENCOUNTER — Other Ambulatory Visit: Payer: Self-pay

## 2016-01-01 DIAGNOSIS — C8580 Other specified types of non-Hodgkin lymphoma, unspecified site: Secondary | ICD-10-CM

## 2016-01-01 MED ORDER — AMOXICILLIN 500 MG PO TABS: 500.0000 mg | ORAL_TABLET | Freq: Two times a day (BID) | ORAL | 0 refills | Status: DC

## 2016-01-01 NOTE — Telephone Encounter (Signed)
[name] called stating her father took antibiotic for his dental appt but she forgot to have him stop his xarelto. The dental surgery new appt date is Sept 20. She is needing a new Rx for antibiotic. Also she is talking with scheduler to get lab/MD/inf set up for 2 weeks after the 20th.

## 2016-01-01 NOTE — Telephone Encounter (Signed)
01/25/2016 Appointment rescheduled for canceled 12/23/2015 appointments.

## 2016-01-04 ENCOUNTER — Telehealth: Payer: Self-pay | Admitting: *Deleted

## 2016-01-04 NOTE — Telephone Encounter (Signed)
Per staff message I have scheduled appts. Notified scheduler

## 2016-01-06 DIAGNOSIS — Z5111 Encounter for antineoplastic chemotherapy: Secondary | ICD-10-CM | POA: Diagnosis not present

## 2016-01-06 DIAGNOSIS — C61 Malignant neoplasm of prostate: Secondary | ICD-10-CM | POA: Diagnosis not present

## 2016-01-06 DIAGNOSIS — E785 Hyperlipidemia, unspecified: Secondary | ICD-10-CM | POA: Diagnosis not present

## 2016-01-06 DIAGNOSIS — E119 Type 2 diabetes mellitus without complications: Secondary | ICD-10-CM | POA: Diagnosis not present

## 2016-01-06 DIAGNOSIS — I10 Essential (primary) hypertension: Secondary | ICD-10-CM | POA: Diagnosis not present

## 2016-01-08 NOTE — Telephone Encounter (Signed)
No entry 

## 2016-01-18 DIAGNOSIS — E119 Type 2 diabetes mellitus without complications: Secondary | ICD-10-CM | POA: Diagnosis not present

## 2016-01-18 DIAGNOSIS — Z23 Encounter for immunization: Secondary | ICD-10-CM | POA: Diagnosis not present

## 2016-01-18 DIAGNOSIS — E785 Hyperlipidemia, unspecified: Secondary | ICD-10-CM | POA: Diagnosis not present

## 2016-01-18 DIAGNOSIS — I10 Essential (primary) hypertension: Secondary | ICD-10-CM | POA: Diagnosis not present

## 2016-01-25 ENCOUNTER — Ambulatory Visit (HOSPITAL_BASED_OUTPATIENT_CLINIC_OR_DEPARTMENT_OTHER): Payer: Medicare Other

## 2016-01-25 ENCOUNTER — Other Ambulatory Visit: Payer: Self-pay | Admitting: *Deleted

## 2016-01-25 ENCOUNTER — Ambulatory Visit (HOSPITAL_BASED_OUTPATIENT_CLINIC_OR_DEPARTMENT_OTHER): Payer: Medicare Other | Admitting: Oncology

## 2016-01-25 ENCOUNTER — Ambulatory Visit: Payer: Medicare Other

## 2016-01-25 VITALS — BP 104/62 | HR 65 | Temp 98.7°F | Resp 18

## 2016-01-25 VITALS — BP 113/48 | HR 69 | Temp 97.9°F | Resp 18 | Ht 66.0 in | Wt 176.6 lb

## 2016-01-25 DIAGNOSIS — C8593 Non-Hodgkin lymphoma, unspecified, intra-abdominal lymph nodes: Secondary | ICD-10-CM | POA: Diagnosis not present

## 2016-01-25 DIAGNOSIS — Z5112 Encounter for antineoplastic immunotherapy: Secondary | ICD-10-CM

## 2016-01-25 DIAGNOSIS — N183 Chronic kidney disease, stage 3 unspecified: Secondary | ICD-10-CM

## 2016-01-25 DIAGNOSIS — C61 Malignant neoplasm of prostate: Secondary | ICD-10-CM

## 2016-01-25 DIAGNOSIS — C8580 Other specified types of non-Hodgkin lymphoma, unspecified site: Secondary | ICD-10-CM

## 2016-01-25 DIAGNOSIS — D630 Anemia in neoplastic disease: Secondary | ICD-10-CM

## 2016-01-25 DIAGNOSIS — C8203 Follicular lymphoma grade I, intra-abdominal lymph nodes: Secondary | ICD-10-CM

## 2016-01-25 DIAGNOSIS — C8219 Follicular lymphoma grade II, extranodal and solid organ sites: Secondary | ICD-10-CM

## 2016-01-25 DIAGNOSIS — D631 Anemia in chronic kidney disease: Secondary | ICD-10-CM

## 2016-01-25 DIAGNOSIS — I4891 Unspecified atrial fibrillation: Secondary | ICD-10-CM | POA: Diagnosis not present

## 2016-01-25 DIAGNOSIS — Z95828 Presence of other vascular implants and grafts: Secondary | ICD-10-CM

## 2016-01-25 LAB — COMPREHENSIVE METABOLIC PANEL
ALT: 24 U/L (ref 0–55)
AST: 25 U/L (ref 5–34)
Albumin: 3.6 g/dL (ref 3.5–5.0)
Alkaline Phosphatase: 53 U/L (ref 40–150)
Anion Gap: 10 mEq/L (ref 3–11)
BUN: 21.5 mg/dL (ref 7.0–26.0)
CALCIUM: 9.3 mg/dL (ref 8.4–10.4)
CHLORIDE: 107 meq/L (ref 98–109)
CO2: 23 meq/L (ref 22–29)
CREATININE: 1.4 mg/dL — AB (ref 0.7–1.3)
EGFR: 48 mL/min/{1.73_m2} — ABNORMAL LOW (ref >90)
GLUCOSE: 116 mg/dL (ref 70–140)
POTASSIUM: 4.3 meq/L (ref 3.5–5.1)
SODIUM: 140 meq/L (ref 136–145)
Total Bilirubin: 0.35 mg/dL (ref 0.20–1.20)
Total Protein: 6.5 g/dL (ref 6.4–8.3)

## 2016-01-25 LAB — LACTATE DEHYDROGENASE: LDH: 123 U/L — AB (ref 125–245)

## 2016-01-25 LAB — CBC WITH DIFFERENTIAL/PLATELET
BASO%: 0.6 % (ref 0.0–2.0)
Basophils Absolute: 0 10*3/uL (ref 0.0–0.1)
EOS%: 3.8 % (ref 0.0–7.0)
Eosinophils Absolute: 0.2 10*3/uL (ref 0.0–0.5)
HEMATOCRIT: 30.9 % — AB (ref 38.4–49.9)
HGB: 10.1 g/dL — ABNORMAL LOW (ref 13.0–17.1)
LYMPH#: 0.2 10*3/uL — AB (ref 0.9–3.3)
LYMPH%: 4.6 % — AB (ref 14.0–49.0)
MCH: 27.3 pg (ref 27.2–33.4)
MCHC: 32.5 g/dL (ref 32.0–36.0)
MCV: 84.1 fL (ref 79.3–98.0)
MONO#: 0.4 10*3/uL (ref 0.1–0.9)
MONO%: 8.3 % (ref 0.0–14.0)
NEUT%: 82.7 % — ABNORMAL HIGH (ref 39.0–75.0)
NEUTROS ABS: 4.3 10*3/uL (ref 1.5–6.5)
PLATELETS: 152 10*3/uL (ref 140–400)
RBC: 3.68 10*6/uL — AB (ref 4.20–5.82)
RDW: 15.4 % — ABNORMAL HIGH (ref 11.0–14.6)
WBC: 5.2 10*3/uL (ref 4.0–10.3)

## 2016-01-25 LAB — TECHNOLOGIST REVIEW

## 2016-01-25 MED ORDER — SODIUM CHLORIDE 0.9 % IV SOLN
Freq: Once | INTRAVENOUS | Status: AC
  Administered 2016-01-25: 12:00:00 via INTRAVENOUS

## 2016-01-25 MED ORDER — ACETAMINOPHEN 325 MG PO TABS
650.0000 mg | ORAL_TABLET | Freq: Once | ORAL | Status: AC
  Administered 2016-01-25: 650 mg via ORAL

## 2016-01-25 MED ORDER — ACETAMINOPHEN 325 MG PO TABS
ORAL_TABLET | ORAL | Status: AC
  Filled 2016-01-25: qty 2

## 2016-01-25 MED ORDER — RITUXIMAB CHEMO INJECTION 500 MG/50ML
350.0000 mg/m2 | Freq: Once | INTRAVENOUS | Status: AC
  Administered 2016-01-25: 700 mg via INTRAVENOUS
  Filled 2016-01-25: qty 50

## 2016-01-25 MED ORDER — DIPHENHYDRAMINE HCL 25 MG PO CAPS
ORAL_CAPSULE | ORAL | Status: AC
  Filled 2016-01-25: qty 1

## 2016-01-25 MED ORDER — DIPHENHYDRAMINE HCL 25 MG PO CAPS
25.0000 mg | ORAL_CAPSULE | Freq: Once | ORAL | Status: AC
  Administered 2016-01-25: 25 mg via ORAL

## 2016-01-25 MED ORDER — HEPARIN SOD (PORK) LOCK FLUSH 100 UNIT/ML IV SOLN
500.0000 [IU] | Freq: Once | INTRAVENOUS | Status: AC | PRN
  Administered 2016-01-25: 500 [IU]
  Filled 2016-01-25: qty 5

## 2016-01-25 MED ORDER — SODIUM CHLORIDE 0.9 % IJ SOLN
10.0000 mL | INTRAMUSCULAR | Status: DC | PRN
  Administered 2016-01-25: 10 mL via INTRAVENOUS
  Filled 2016-01-25: qty 10

## 2016-01-25 MED ORDER — SODIUM CHLORIDE 0.9 % IJ SOLN
10.0000 mL | INTRAMUSCULAR | Status: DC | PRN
  Administered 2016-01-25: 10 mL
  Filled 2016-01-25: qty 10

## 2016-01-25 NOTE — Progress Notes (Signed)
ID: [name] OB: 30-Aug-1941  MR#: [id number]  WUJ#:[id number]  PCP: Horatio Pel, MD GYN:   SU: [name] OTHER MD: [name], [name],  [name], [name], [name]  CHIEF COMPLAINT: Non-Hodgkin's lymphoma, stage IV prostate cancer  CURRENT TREATMENT: Maintenance rituximab, starting Aranesp, considering Denosumab  NON-HODGKIN"S LYMPHOMA HISTORY: From the prior summary:  The patient developed right upper quadrant pain last year and he had an ultrasound April 5th which showed some gallstones without evidence of cholecystitis or ductal dilatation.  However, there were multiple lesions in the liver which could not be assessed further.  Accordingly on July 31, 2003, [name], showed multiple liver masses, more than 25, most over 1 cm, the largest being in the inferior right lobe, measuring 4.2 cm. There was also a small mass in the spleen.  CT of the pelvis was unremarkable.   The patient had a biopsy of the liver August 01, 2003.  The report says only that it was "a lesion in the right lobe of the liver".  Presumably this was the largest lesion present.  The final pathology [id number] and [id number]) showed only cirrhosis.     The patient has been followed by [name], and a repeat CT scan of the abdomen and pelvis was obtained May 18, 2004.  Many of the liver lesions seen previously had actually decreased in size.  However, [name], [name], a second ultrasound-guided biopsy was performed 06/11/04. This was a lesion deep in the left lobe of the liver and therefore, I would think  not the same one previously biopsied which was in the right liver. The pathology this time [id number]) shows a poorly differentiated neuroendocrine carcinoma which was positive for chromogranin A, negative for synaptophysin, thyroid transcription factor, a variety of cytokeratins, [name], alpha-fetoprotein and COX-2."  Mr. Voong was subsequently evaluated at [facility] by Dr. Otelia Limes and repeat liver biopsy and review of the earlier biopsy here showed a primary hepatic lymphoma. The patient was treated with R-CHOP as detailed below and achieved a complete response. He received maintenance rituximab until 2008  His subsequent history is as detailed below  INTERVAL HISTORY. AC returns today with one of his daughters for follow up of his primary hepatic lymphoma. His lymphoma treatments have been delayed because of persistent dental work but he had his final extraction. He is also followed by Dr. [name] for his prostate cancer.  He has had some squamous cell skin cancers removed by Dr. Ubaldo Glassing. He still spends a lot of time outdoors working in his garden.  REVIEW OF SYSTEMS:  AC Has problems with his hearing, of course problems with his teeth and gums, which have been intensively treated recently, as a bit of a runny nose, gets ankle swelling, and the short of breath at times particularly when walking upstairs. But denies cough, phlegm production or pleurisy. He has loose bowel movements currently on antibiotics for the gum problem. He has urinary dribbling. He is a little bit unsteady on his feet at times. He feels forgetful. He tells me his sugars  are "okay". He denies pain. Abdomen no fevers, drenching sweats, or unexplained fatigue or weight loss. A detailed review of systems today was stable except as noted  PAST MEDICAL HISTORY: Past Medical History:  Diagnosis Date  . A-fib ([place])   . Anemia   . Arthritis   . Atrial fibrillation ([place])   . Cancer of liver ([place])   . Diabetes mellitus     INSULIN DEPENDENT  . GERD (gastroesophageal reflux disease)   . HOH (hard of hearing)   . HX, PERSONAL, MALIGNANCY, PROSTATE 07/28/2006   Annotation: 2001, resected Qualifier: Diagnosis of  By: [name], [name]    . Hypertension   . Kidney stone   . Lymphoma ([place])   . Memory deficit 10/18/2013  . Near syncope 10/18/2013  . Neuropathy in diabetes ([place])    Hx: of  . Prostate cancer ([place])   . [name]    with exertion   . Skin cancer   . Sleep apnea    on CPAP - has not used in a long time   Significant for prostate cancer, the patient undergoing prostatectomy January 2001 under [facility] for a Gleason 7, pathologic T3b (positive seminal vesicle involvement) adenocarcinoma with 0 of 2 lymph nodes involved.  Current PSA is followed by Dr. [name]. Other medical problems include sleep apnea, minor coronary artery disease, hypertension, hypertriglyceridemia, cholelithiasis, colon polyps, cirrhosis by biopsy, diabetes, [name], history of nephrolithiasis, status post right renal surgery and history of left rotator cuff repair under [name].    PAST SURGICAL HISTORY: Past Surgical History:  Procedure Laterality Date  . ANKLE SURGERY    . CARDIAC CATHETERIZATION  09/16/96   Normal LV systolic function,dense ca+ prox. portion of the LAD w/50% narrowing in the distal portion, 30-40% irreg. in the proximal portion & 80% narrowing in the ostial portion of the posterolateral branch.  . CHOLECYSTECTOMY  04/04/2013  . CHOLECYSTECTOMY N/A 04/04/2013   Procedure: LAPAROSCOPIC CHOLECYSTECTOMY WITH INTRAOPERATIVE CHOLANGIOGRAM;  [name], MD;  Location: [facility];  Service: General;  [name];  . COLONOSCOPY     Hx: of  . CYSTOSCOPY W/ URETERAL STENT PLACEMENT Bilateral 06/01/2015   Procedure: CYSTOSCOPY WITH BILATERAL STENT REPLACEMENT;  Surgeon: Raynelle Bring, MD;  Location: [facility];  Service: Urology;   Laterality: Bilateral;  . CYSTOSCOPY W/ URETERAL STENT PLACEMENT Bilateral 10/29/2015   Procedure: CYSTOSCOPY WITH BILATERAL STENT REPLACEMENT;  Surgeon: Raynelle Bring, MD;  Location: [facility];  Service: Urology;  Laterality: Bilateral;  . CYSTOSCOPY WITH STENT PLACEMENT Bilateral 02/05/2015   Procedure: CYSTOSCOPY RETROGRADE AND BILATERAL  STENT PLACEMENT;  Surgeon: Kathie Rhodes, MD;  Location: [facility];  Service: Urology;  Laterality: Bilateral;  . INFUSION PORT  04/04/2013   RIGHT SUBCLAVIAN  . KIDNEY STONE SURGERY    . MOUTH SURGERY  10/19/2015   left upper teeth removed along with palate abscess   . PORTACATH PLACEMENT N/A 04/04/2013   Procedure: INSERTION PORT-A-CATH;  [name], MD;  Location: [facility];  Service: General;  [name];  . PROSTATECTOMY    . ROTATOR CUFF REPAIR    . TRANSURETHRAL RESECTION OF BLADDER TUMOR WITH GYRUS (TURBT-GYRUS) N/A 02/05/2015   Procedure: TRANSURETHRAL RESECTION OF BLADDER TUMOR  ;  Surgeon: Kathie Rhodes, MD;  Location: [facility];  Service: Urology;  [name];    FAMILY HISTORY Family History  Problem Relation Age of Onset  . Heart disease Father   .  Heart attack Father   . Cancer Sister   . Heart attack Brother   . Heart attack Brother   . Heart attack Brother   . Heart attack Brother   . Heart attack Brother   . Cancer Brother   . Cancer Brother   The patient's father died at the age of 65 from an MI.  The patient's mother died from "old age" at 84.  The patient is one of nine siblings.  One brother died from cancer of the esophagus, 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, [name], a homemaker, died in 06-22-12: [name] is an Scientist, physiological for [employer]; [name], is a bookkeeper  for a [employer]; and [name] is Environmental consultant.  Everybody lives in [place].  The patient has five grandchildren.  He is a member of [place]. [place].    ADVANCED DIRECTIVES: in place   HEALTH MAINTENANCE: Social History  Substance Use Topics  . Smoking status: Former Smoker    Types: Pipe, Landscape architect  . Smokeless tobacco: Former Systems developer    Types: Chew     Comment: [name] YEARS AGO "  . Alcohol use No     Colonoscopy:  PSA: Followed by Dr. [name]  Bone density:  Lipid panel:  Allergies  Allergen Reactions  . Atenolol Other (See Comments)    Slowed heart rate.   . Niacin Other (See Comments)    headaches    Current Outpatient Prescriptions  Medication Sig Dispense Refill  . allopurinol (ZYLOPRIM) 300 MG tablet Take 1 tablet (300 mg total) by mouth daily. 90 tablet 0  . amLODipine (NORVASC) 5 MG tablet Take 2.5 mg by mouth daily.     Marland Kitchen amoxicillin (AMOXIL) 500 MG tablet Take 1 tablet (500 mg total) by mouth 2 (two) times daily. Start date to be provided by dentist Dr. Eula Listen. 14 tablet 0  . atorvastatin (LIPITOR) 20 MG tablet Take 20 mg by mouth at bedtime.    . Calcium-Magnesium-Vitamin D (CALCIUM 1200+D3 PO) Take 2 tablets by mouth daily.     . cetirizine (ZYRTEC) 10 MG tablet Take 10 mg by mouth daily.    . cholestyramine (QUESTRAN) 4 g packet Take 4 g by mouth 3 (three) times daily with meals.    . Cyanocobalamin (VITAMIN B 12 PO) Take 2,500 mg by mouth daily.     . fenofibrate 160 MG tablet Take 160 mg by mouth at bedtime.    . gabapentin (NEURONTIN) 300 MG capsule Take 300 mg by mouth 3 (three) times daily.     Marland Kitchen lidocaine-prilocaine (EMLA) cream Apply 1 application topically as needed. (Patient taking differently: Apply 1 application topically daily as needed (port numbing). ) 30 g 3  . magnesium oxide (MAG-OX) 400 (241.3 MG) MG tablet Take 400 tablets by mouth 2 (two) times daily.   4  .  metFORMIN (GLUCOPHAGE) 1000 MG tablet Take 1,000 mg by mouth 2 (two) times daily with a meal.     . Multiple Vitamins-Minerals (MULTIVITAMIN WITH MINERALS) tablet Take 1 tablet by mouth daily.    Marland Kitchen oxyCODONE-acetaminophen (PERCOCET) 5-325 MG tablet Take 1-2 tablets by mouth every 6 (six) hours as needed (for pain;  may cause constipation). 20 tablet 0  . Rivaroxaban (XARELTO) 15 MG TABS tablet Take 15 mg by mouth every evening.     . sertraline (ZOLOFT) 100 MG tablet Take 100 mg by mouth daily.    Nelva Nay SOLOSTAR 300 UNIT/ML SOPN INJECT 60 UNITS SUB-Q ONCE DAILY  5  . vitamin C (ASCORBIC ACID) 500 MG tablet Take 500 mg by mouth daily.     No current facility-administered medications for this visit.    Facility-Administered Medications Ordered in Other Visits  Medication Dose Route Frequency Provider Last Rate Last Dose  . sodium chloride 0.9 % injection 10 mL  10 mL Intravenous PRN [name], MD   10 mL at 01/25/16 1013    Objective: Older white man Who appears stated age 46:   01/25/16 1033  BP: (!) 113/48  Pulse: 69  Resp: 18  Temp: 97.9 F (36.6 C)     Body mass index is 28.5 kg/m.      ECOG FS:1 - Symptomatic but completely ambulatory  Sclerae unicteric, pupils round and equal Oropharynx clear and moist-- no thrush or other lesions No cervical or supraclavicular adenopathy, no axillary or inguinal adenopathy Lungs no rales or rhonchi Heart regular rate and rhythm Abd soft, nontender, positive bowel sounds MSK no focal spinal tenderness to moderate palpation Neuro: nonfocal, well oriented, appropriate affect     LAB RESULTS:  CMP     Component Value Date/Time   NA 140 01/25/2016 1005   K 4.3 01/25/2016 1005   CL 106 10/29/2015 0625   CO2 23 01/25/2016 1005   GLUCOSE 116 01/25/2016 1005   BUN 21.5 01/25/2016 1005   CREATININE 1.4 (H) 01/25/2016 1005   CALCIUM 9.3 01/25/2016 1005   PROT 6.5 01/25/2016 1005   ALBUMIN 3.6 01/25/2016 1005   AST 25  01/25/2016 1005   ALT 24 01/25/2016 1005   ALKPHOS 53 01/25/2016 1005   BILITOT 0.35 01/25/2016 1005   GFRNONAA 41 (L) 10/29/2015 0625   GFRAA 47 (L) 10/29/2015 0625    No results found for: SPEP  Lab Results  Component Value Date   WBC 5.2 01/25/2016   NEUTROABS 4.3 01/25/2016   HGB 10.1 (L) 01/25/2016   HCT 30.9 (L) 01/25/2016   MCV 84.1 01/25/2016   PLT 152 01/25/2016      Chemistry      Component Value Date/Time   NA 140 01/25/2016 1005   K 4.3 01/25/2016 1005   CL 106 10/29/2015 0625   CO2 23 01/25/2016 1005   BUN 21.5 01/25/2016 1005   CREATININE 1.4 (H) 01/25/2016 1005      Component Value Date/Time   CALCIUM 9.3 01/25/2016 1005   ALKPHOS 53 01/25/2016 1005   AST 25 01/25/2016 1005   ALT 24 01/25/2016 1005   BILITOT 0.35 01/25/2016 1005      Urinalysis    Component Value Date/Time   COLORURINE YELLOW 02/04/2015 1649   APPEARANCEUR TURBID (A) 02/04/2015 1649   LABSPEC 1.020 04/24/2015 0846   PHURINE 6.0 04/24/2015 0846   PHURINE 5.5 02/04/2015 1649   GLUCOSEU 250 04/24/2015 0846   HGBUR Large 04/24/2015 0846   HGBUR LARGE (A) 02/04/2015 1649   BILIRUBINUR Negative 04/24/2015 0846   KETONESUR Negative 04/24/2015 0846   KETONESUR NEGATIVE 02/04/2015 1649   PROTEINUR 30 04/24/2015 0846   PROTEINUR 100 (A) 02/04/2015 1649   UROBILINOGEN 0.2 04/24/2015 0846   NITRITE Negative 04/24/2015 0846   NITRITE POSITIVE (A) 02/04/2015 1649   LEUKOCYTESUR  Negative 04/24/2015 0846    STUDIES: No results found.  ASSESSMENT:  82 y.o. patient with a diagnosis of    #1 Primary hepatic non-Hodgkin's lymphoma established through liver biopsy February 2006, treated with Rituxan x9 and then CHOP x6.  All chemotherapy completed in 2007.  Maintenance Rituxan discontinued October 2008.     #2  Biopsy proven retroperitoneal recurrence documented November 2014. Bone marrow biopsy 04/01/2013 was negative   #3 status post  laparoscopic cholecystectomy and port placement  on 04/04/2013.  #4 Cycle #1  RICE chemotherapy  completed on 05/02/2013.   #5 cycle #2 RICE chemotherapy  on 05/17/2013  #6 PET scan performed on 05/23/2013 revealed marked partial response to chemotherapy with a retroperitoneal mass decrease in metabolic activity from SUV of 13.2 to 6.3. Right adrenal mass size and metabolic activity is resolved when compared to the previous PET scan.   #7 Evaluated at [facility] by Dr. Tomasa Hosteller for autologous transplant on 05/27/2013 and was quoted a 3% chance of significant heart damage and possibly death from the treatment and a 50% chance of being alive 5 years from now after transplant (versus perhaps 2 years without). After much thought the patient and family have decided firmly they do not wish to proceed to stem cell transplant.  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 We are resuming the rituximab treatments today and will repeat dose every 28 days until there is evidence of disease recurrence.  He also has stage IV prostate cancer. This is followed by Dr. [name]. I do not have his latest notes however.  I'm going to [facility]age [facility] in December with a PET scan. He will then see me at the very end of the year to discuss those results. For now we are making no changes to his treatment. He knows to call for any problems that may develop before the next visit.  [name], MD   01/25/2016

## 2016-01-25 NOTE — Patient Instructions (Signed)
[facility] Discharge Instructions for Patients Receiving Chemotherapy  Today you received the following chemotherapy agents: Rituxan   To help prevent nausea and vomiting after your treatment, we encourage you to take your nausea medication as directed.    If you develop nausea and vomiting that is not controlled by your nausea medication, call the clinic.   BELOW ARE SYMPTOMS THAT SHOULD BE REPORTED IMMEDIATELY:  *FEVER GREATER THAN 100.5 F  *CHILLS WITH OR WITHOUT FEVER  NAUSEA AND VOMITING THAT IS NOT CONTROLLED WITH YOUR NAUSEA MEDICATION  *UNUSUAL [name]  *UNUSUAL BRUISING OR BLEEDING  TENDERNESS IN MOUTH AND THROAT WITH OR WITHOUT PRESENCE OF ULCERS  *URINARY PROBLEMS  *BOWEL PROBLEMS  UNUSUAL RASH Items with * indicate a potential emergency and should be followed up as soon as possible.  Feel free to call the clinic you have any questions or concerns. The clinic phone number is [phone].  Please show the CHEMO ALERT CARD at check-in to the Emergency Department and triage nurse.   

## 2016-02-10 DIAGNOSIS — C61 Malignant neoplasm of prostate: Secondary | ICD-10-CM | POA: Diagnosis not present

## 2016-02-10 DIAGNOSIS — N1330 Unspecified hydronephrosis: Secondary | ICD-10-CM | POA: Diagnosis not present

## 2016-02-16 ENCOUNTER — Telehealth: Payer: Self-pay | Admitting: *Deleted

## 2016-02-16 NOTE — Telephone Encounter (Signed)
VM message received @1029  am from [place] regarding upcoming appts for pt.  Attempted call back. No answer but was able to leave VM for lisa to return call to  [phone]

## 2016-02-17 ENCOUNTER — Telehealth: Payer: Self-pay | Admitting: *Deleted

## 2016-02-17 NOTE — Telephone Encounter (Signed)
Received call from daughter [name] inquiring about pt's appt on Friday 02/19/16 for Rituxan.  Per [name], pt was supposed to receive Rituxan every 8 weeks.  However, pt had last Rituxan infusion on 01/25/16.   [name] stated she was not aware of appt for Rituxan  Has been changed and  scheduled for every 4 weeks.   [name] would like to talk to Dr. Jana Hakim , or desk nurse for further clarification. Lisa's    Phone    [phone].

## 2016-02-17 NOTE — Telephone Encounter (Signed)
This RN received call from pt's daughter [name] inquiring about scheduled IV therapy this coming Friday.  Per [name] she thought treatment for maintenance is every 8 weeks - not 4. Last treatment was 10/2.  Discussed with MD who stated above is correct ( noted in chart as q 4 ).  CURRENT RITUXIN THERAPY IS EVERY 8 WEEKS.  This RN reviewed appointments and corrected per above-  Evette Cristal with MD clarification.

## 2016-02-18 ENCOUNTER — Other Ambulatory Visit: Payer: Self-pay | Admitting: Oncology

## 2016-02-19 ENCOUNTER — Other Ambulatory Visit: Payer: Medicare Other

## 2016-02-19 ENCOUNTER — Ambulatory Visit: Payer: Medicare Other

## 2016-02-22 DIAGNOSIS — E119 Type 2 diabetes mellitus without complications: Secondary | ICD-10-CM | POA: Diagnosis not present

## 2016-02-22 DIAGNOSIS — E1140 Type 2 diabetes mellitus with diabetic neuropathy, unspecified: Secondary | ICD-10-CM | POA: Diagnosis not present

## 2016-02-29 DIAGNOSIS — E119 Type 2 diabetes mellitus without complications: Secondary | ICD-10-CM | POA: Diagnosis not present

## 2016-03-16 ENCOUNTER — Other Ambulatory Visit: Payer: Self-pay | Admitting: *Deleted

## 2016-03-16 ENCOUNTER — Other Ambulatory Visit: Payer: Self-pay | Admitting: Hematology and Oncology

## 2016-03-18 ENCOUNTER — Ambulatory Visit: Payer: Medicare Other

## 2016-03-18 ENCOUNTER — Other Ambulatory Visit (HOSPITAL_BASED_OUTPATIENT_CLINIC_OR_DEPARTMENT_OTHER): Payer: Medicare Other

## 2016-03-18 ENCOUNTER — Ambulatory Visit (HOSPITAL_BASED_OUTPATIENT_CLINIC_OR_DEPARTMENT_OTHER): Payer: Medicare Other

## 2016-03-18 VITALS — BP 119/55 | HR 64 | Temp 97.9°F | Resp 17

## 2016-03-18 DIAGNOSIS — C8590 Non-Hodgkin lymphoma, unspecified, unspecified site: Secondary | ICD-10-CM

## 2016-03-18 DIAGNOSIS — C8593 Non-Hodgkin lymphoma, unspecified, intra-abdominal lymph nodes: Secondary | ICD-10-CM

## 2016-03-18 DIAGNOSIS — C8219 Follicular lymphoma grade II, extranodal and solid organ sites: Secondary | ICD-10-CM

## 2016-03-18 DIAGNOSIS — C8580 Other specified types of non-Hodgkin lymphoma, unspecified site: Secondary | ICD-10-CM

## 2016-03-18 DIAGNOSIS — C8203 Follicular lymphoma grade I, intra-abdominal lymph nodes: Secondary | ICD-10-CM

## 2016-03-18 DIAGNOSIS — Z95828 Presence of other vascular implants and grafts: Secondary | ICD-10-CM

## 2016-03-18 DIAGNOSIS — Z5112 Encounter for antineoplastic immunotherapy: Secondary | ICD-10-CM | POA: Diagnosis not present

## 2016-03-18 DIAGNOSIS — C61 Malignant neoplasm of prostate: Secondary | ICD-10-CM

## 2016-03-18 LAB — COMPREHENSIVE METABOLIC PANEL
ALBUMIN: 3.8 g/dL (ref 3.5–5.0)
ALK PHOS: 61 U/L (ref 40–150)
ALT: 27 U/L (ref 0–55)
ANION GAP: 11 meq/L (ref 3–11)
AST: 28 U/L (ref 5–34)
BILIRUBIN TOTAL: 0.31 mg/dL (ref 0.20–1.20)
BUN: 21.9 mg/dL (ref 7.0–26.0)
CO2: 22 mEq/L (ref 22–29)
Calcium: 9.8 mg/dL (ref 8.4–10.4)
Chloride: 104 mEq/L (ref 98–109)
Creatinine: 1.6 mg/dL — ABNORMAL HIGH (ref 0.7–1.3)
EGFR: 40 mL/min/{1.73_m2} — AB (ref >90)
Glucose: 157 mg/dl — ABNORMAL HIGH (ref 70–140)
Potassium: 4 mEq/L (ref 3.5–5.1)
Sodium: 138 mEq/L (ref 136–145)
TOTAL PROTEIN: 6.7 g/dL (ref 6.4–8.3)

## 2016-03-18 LAB — CBC WITH DIFFERENTIAL/PLATELET
BASO%: 0.2 % (ref 0.0–2.0)
Basophils Absolute: 0 10*3/uL (ref 0.0–0.1)
EOS ABS: 0.3 10*3/uL (ref 0.0–0.5)
EOS%: 5.3 % (ref 0.0–7.0)
HCT: 31.3 % — ABNORMAL LOW (ref 38.4–49.9)
HEMOGLOBIN: 10.3 g/dL — AB (ref 13.0–17.1)
LYMPH%: 8.4 % — AB (ref 14.0–49.0)
MCH: 27.2 pg (ref 27.2–33.4)
MCHC: 32.9 g/dL (ref 32.0–36.0)
MCV: 82.8 fL (ref 79.3–98.0)
MONO#: 0.2 10*3/uL (ref 0.1–0.9)
MONO%: 4.3 % (ref 0.0–14.0)
NEUT%: 81.8 % — ABNORMAL HIGH (ref 39.0–75.0)
NEUTROS ABS: 4 10*3/uL (ref 1.5–6.5)
PLATELETS: 135 10*3/uL — AB (ref 140–400)
RBC: 3.78 10*6/uL — ABNORMAL LOW (ref 4.20–5.82)
RDW: 15.4 % — AB (ref 11.0–14.6)
WBC: 4.9 10*3/uL (ref 4.0–10.3)
lymph#: 0.4 10*3/uL — ABNORMAL LOW (ref 0.9–3.3)

## 2016-03-18 LAB — LACTATE DEHYDROGENASE: LDH: 138 U/L (ref 125–245)

## 2016-03-18 MED ORDER — ACETAMINOPHEN 325 MG PO TABS
650.0000 mg | ORAL_TABLET | Freq: Once | ORAL | Status: AC
  Administered 2016-03-18: 650 mg via ORAL

## 2016-03-18 MED ORDER — DIPHENHYDRAMINE HCL 25 MG PO CAPS
25.0000 mg | ORAL_CAPSULE | Freq: Once | ORAL | Status: AC
  Administered 2016-03-18: 25 mg via ORAL

## 2016-03-18 MED ORDER — SODIUM CHLORIDE 0.9 % IJ SOLN
10.0000 mL | INTRAMUSCULAR | Status: DC | PRN
  Administered 2016-03-18: 10 mL via INTRAVENOUS
  Filled 2016-03-18: qty 10

## 2016-03-18 MED ORDER — SODIUM CHLORIDE 0.9 % IV SOLN
Freq: Once | INTRAVENOUS | Status: AC
  Administered 2016-03-18: 09:00:00 via INTRAVENOUS

## 2016-03-18 MED ORDER — SODIUM CHLORIDE 0.9 % IV SOLN
350.0000 mg/m2 | Freq: Once | INTRAVENOUS | Status: AC
  Administered 2016-03-18: 700 mg via INTRAVENOUS
  Filled 2016-03-18: qty 50

## 2016-03-18 MED ORDER — ACETAMINOPHEN 325 MG PO TABS
ORAL_TABLET | ORAL | Status: AC
  Filled 2016-03-18: qty 2

## 2016-03-18 MED ORDER — SODIUM CHLORIDE 0.9 % IJ SOLN
10.0000 mL | INTRAMUSCULAR | Status: DC | PRN
  Administered 2016-03-18: 10 mL
  Filled 2016-03-18: qty 10

## 2016-03-18 MED ORDER — HEPARIN SOD (PORK) LOCK FLUSH 100 UNIT/ML IV SOLN
500.0000 [IU] | Freq: Once | INTRAVENOUS | Status: AC | PRN
  Administered 2016-03-18: 500 [IU]
  Filled 2016-03-18: qty 5

## 2016-03-18 NOTE — Patient Instructions (Signed)
[facility] Discharge Instructions for Patients Receiving Chemotherapy  Today you received the following chemotherapy agents: Rituxan   To help prevent nausea and vomiting after your treatment, we encourage you to take your nausea medication as directed.    If you develop nausea and vomiting that is not controlled by your nausea medication, call the clinic.   BELOW ARE SYMPTOMS THAT SHOULD BE REPORTED IMMEDIATELY:  *FEVER GREATER THAN 100.5 F  *CHILLS WITH OR WITHOUT FEVER  NAUSEA AND VOMITING THAT IS NOT CONTROLLED WITH YOUR NAUSEA MEDICATION  *UNUSUAL [name]  *UNUSUAL BRUISING OR BLEEDING  TENDERNESS IN MOUTH AND THROAT WITH OR WITHOUT PRESENCE OF ULCERS  *URINARY PROBLEMS  *BOWEL PROBLEMS  UNUSUAL RASH Items with * indicate a potential emergency and should be followed up as soon as possible.  Feel free to call the clinic you have any questions or concerns. The clinic phone number is [phone].  Please show the CHEMO ALERT CARD at check-in to the Emergency Department and triage nurse.   

## 2016-03-28 ENCOUNTER — Telehealth: Payer: Self-pay | Admitting: *Deleted

## 2016-03-28 NOTE — Telephone Encounter (Signed)
"  My father's chemotherapy should be every eight weeks.  He needs the appointment cancelled for 04-15-2016."  Advised he has lab and flush only.  Labs are for the F/U on 04-22-2016.  "When will the treatment for January be scheduled?"  After F/u future appointments will be scheduled.  No further questions.

## 2016-03-29 ENCOUNTER — Other Ambulatory Visit: Payer: Self-pay | Admitting: Urology

## 2016-04-15 ENCOUNTER — Other Ambulatory Visit: Payer: Medicare Other

## 2016-04-15 ENCOUNTER — Telehealth: Payer: Self-pay | Admitting: Oncology

## 2016-04-15 ENCOUNTER — Ambulatory Visit: Payer: Medicare Other

## 2016-04-15 NOTE — Telephone Encounter (Signed)
Patient dtr called to r/s 12/22 lab/flush and 12/29 f/u to 1/23. Lab/flush/fu all on same day per dtr. Dtr has new date/time and forwarded to desk nurse re questions about next tx.

## 2016-04-22 ENCOUNTER — Ambulatory Visit: Payer: Medicare Other | Admitting: Oncology

## 2016-04-22 ENCOUNTER — Ambulatory Visit (HOSPITAL_BASED_OUTPATIENT_CLINIC_OR_DEPARTMENT_OTHER): Payer: Medicare Other | Admitting: Oncology

## 2016-04-22 VITALS — BP 123/63 | HR 70 | Temp 97.9°F | Resp 18 | Ht 66.0 in | Wt 182.5 lb

## 2016-04-22 DIAGNOSIS — C61 Malignant neoplasm of prostate: Secondary | ICD-10-CM | POA: Diagnosis not present

## 2016-04-22 DIAGNOSIS — I4891 Unspecified atrial fibrillation: Secondary | ICD-10-CM

## 2016-04-22 DIAGNOSIS — C8593 Non-Hodgkin lymphoma, unspecified, intra-abdominal lymph nodes: Secondary | ICD-10-CM

## 2016-04-22 DIAGNOSIS — C8332 Diffuse large B-cell lymphoma, intrathoracic lymph nodes: Secondary | ICD-10-CM

## 2016-04-22 NOTE — Progress Notes (Signed)
ID: [name] Begin OB: 1931/06/02  MR#: [id number]  OQZ#:[id number]  PCP: [name], MD GYN:   SU: Darnell Level OTHER MD: [name], [name],  [name], [name], [name]  CHIEF COMPLAINT: Non-Hodgkin's lymphoma, stage IV prostate cancer  CURRENT TREATMENT: Maintenance rituximab  NON-HODGKIN"S LYMPHOMA HISTORY: From the prior summary:  The patient developed right upper quadrant pain last year and he had an ultrasound April 5th which showed some gallstones without evidence of cholecystitis or ductal dilatation.  However, there were multiple lesions in the liver which could not be assessed further.  Accordingly on July 31, 2003, [name], showed multiple liver masses, more than 25, most over 1 cm, the largest being in the inferior right lobe, measuring 4.2 cm. There was also a small mass in the spleen.  CT of the pelvis was unremarkable.   The patient had a biopsy of the liver August 01, 2003.  The report says only that it was "a lesion in the right lobe of the liver".  Presumably this was the largest lesion present.  The final pathology [id number] and [id number]) showed only cirrhosis.     The patient has been followed by [name], and a repeat CT scan of the abdomen and pelvis was obtained May 18, 2004.  Many of the liver lesions seen previously had actually decreased in size.  However, [name], [name], a second ultrasound-guided biopsy was performed 06/11/04. This was a lesion deep in the left lobe of the liver and therefore, I would think not the same one previously biopsied which  was in the right liver. The pathology this time [id number]) shows a poorly differentiated neuroendocrine carcinoma which was positive for chromogranin A, negative for synaptophysin, thyroid transcription factor, a variety of cytokeratins, [name], alpha-fetoprotein and COX-2."  [name] was subsequently evaluated at [facility] by Dr. Delene Ruffini and repeat liver biopsy and review of the earlier biopsy here showed a primary hepatic lymphoma. The patient was treated with R-CHOP as detailed below and achieved a complete response. He received maintenance rituximab until 2008  His subsequent history is as detailed below  INTERVAL HISTORY. AC returns today with one of his daughters for follow up of his primary hepatic lymphoma. This is clinically stable. Specifically he denies weight loss, unusual fatigue, intercurrent fever, rash, bleeding, drenching sweats, or adenopathy.  He was mistakenly scheduled for treatment today as well as a visit. They canceled the treatment but did not want to cancel that visit.  REVIEW OF SYSTEMS:  AC has significant hearing problems. He has a little bit of a runny nose, some sinus drainage, and gets short of breath when he walks upstairs. He tells me as soon as the weather gets better his, started getting his garden ready. He tells me every time he eats he has to go to the bathroom. He doesn't have diarrhea. He was prescribed Questran for this and he uses it occasionally, with good results. He feels a little stagger re-when he walks, but he is not using a cane. He denies falls. He fears full capsule,  but not anxious or depressed. He continues to be devoted to his daughter, who he tells me he is currently next to the fire at home on a very comfortable rock, just waiting for him to get back home. A detailed review of systems today was otherwise stable  PAST MEDICAL HISTORY: Past Medical History:  Diagnosis Date  . A-fib (HCC)   . Anemia   . Arthritis   . Atrial fibrillation  (HCC)   . Cancer of liver (HCC)   . Diabetes mellitus    INSULIN DEPENDENT  . GERD (gastroesophageal reflux disease)   . HOH (hard of hearing)   . HX, PERSONAL, MALIGNANCY, PROSTATE 07/28/2006   Annotation: 2001, resected Qualifier: Diagnosis of  By: [name] MD, [name]    . Hypertension   . Kidney stone   . Lymphoma (HCC)   . Memory deficit 10/18/2013  . Near syncope 10/18/2013  . Neuropathy in diabetes (HCC)    Hx: of  . Prostate cancer (HCC)   . [name]    with exertion   . Skin cancer   . Sleep apnea    on CPAP - has not used in a long time   Significant for prostate cancer, the patient undergoing prostatectomy January 2001 under [facility] for a Gleason 7, pathologic T3b (positive seminal vesicle involvement) adenocarcinoma with 0 of 2 lymph nodes involved.  Current PSA is followed by Dr. Laverle Patter. Other medical problems include sleep apnea, minor coronary artery disease, hypertension, hypertriglyceridemia, cholelithiasis, colon polyps, cirrhosis by biopsy, diabetes, squamous cell carcinomas of the skin removed by Janalyn Harder, history of nephrolithiasis, status post right renal surgery and history of left rotator cuff repair under [name].    PAST SURGICAL HISTORY: Past Surgical History:  Procedure Laterality Date  . ANKLE SURGERY    . CARDIAC CATHETERIZATION  09/16/96   Normal LV systolic function,dense ca+ prox. portion of the LAD w/50% narrowing in the distal portion, 30-40% irreg. in the proximal portion & 80% narrowing in the ostial portion of the posterolateral branch.  . CHOLECYSTECTOMY  04/04/2013  . CHOLECYSTECTOMY N/A 04/04/2013   Procedure: LAPAROSCOPIC CHOLECYSTECTOMY WITH INTRAOPERATIVE CHOLANGIOGRAM;  Surgeon: Velora Heckler, MD;  Location: [facility];  Service: General;  [name];  . COLONOSCOPY     Hx: of  . CYSTOSCOPY W/ URETERAL STENT PLACEMENT Bilateral 06/01/2015   Procedure: CYSTOSCOPY WITH BILATERAL STENT REPLACEMENT;  Surgeon: Heloise Purpura, MD;  Location: [facility] ORS;  Service: Urology;  Laterality: Bilateral;  . CYSTOSCOPY W/ URETERAL STENT PLACEMENT Bilateral 10/29/2015   Procedure: CYSTOSCOPY WITH BILATERAL STENT REPLACEMENT;  Surgeon: Heloise Purpura, MD;  Location: [facility] ORS;  Service: Urology;  Laterality: Bilateral;  . CYSTOSCOPY WITH STENT PLACEMENT Bilateral 02/05/2015   Procedure: CYSTOSCOPY RETROGRADE AND BILATERAL  STENT PLACEMENT;  Surgeon: Ihor Gully, MD;  Location: [facility] OR[facility];  Service: Urology;  Laterality: Bilateral;  . INFUSION PORT  04/04/2013   RIGHT SUBCLAVIAN  . KIDNEY STONE SURGERY    . MOUTH SURGERY  10/19/2015   left upper teeth removed along with palate abscess   . PORTACATH PLACEMENT N/A 04/04/2013   Procedure: INSERTION PORT-A-CATH;  Surgeon: Velora Heckler, MD;  Location: [facility] OR;  Service: General;  [name];  . PROSTATECTOMY    . ROTATOR CUFF REPAIR    . TRANSURETHRAL RESECTION OF BLADDER TUMOR WITH GYRUS (TURBT-GYRUS) N/A 02/05/2015   Procedure: TRANSURETHRAL RESECTION OF BLADDER TUMOR  ;  Surgeon: Ihor Gully, MD;  Location: [facility] ORS;  Service: Urology;  [name];    FAMILY HISTORY Family History  Problem Relation Age of Onset  . Heart disease Father   . Heart attack Father   . Cancer Sister   . Heart attack Brother   . Heart attack Brother   . Heart attack Brother   . Heart attack Brother   . Heart attack Brother   . Cancer Brother   . Cancer Brother   The patient's father died at the age of 8 from an MI.  The patient's mother died from "old age" at 58.  The patient is one of nine siblings.  One brother died from cancer of the esophagus, 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, [name], a homemaker, died in Jul 02, 2012: [name] is an Scientist, physiological  for [employer]; [name], [name]; and [name] is Environmental consultant.  Everybody lives in [place].  The patient has five grandchildren.  He is a member of [place]. [place].    ADVANCED DIRECTIVES: in place   HEALTH MAINTENANCE: Social History  Substance Use Topics  . Smoking status: Former Smoker    Types: Pipe, Landscape architect  . Smokeless tobacco: Former Systems developer    Types: Chew     Comment: Stella YEARS AGO "  . Alcohol use No     Colonoscopy:  PSA: Followed by Dr. [name]  Bone density:  Lipid panel:  Allergies  Allergen Reactions  . Atenolol Other (See Comments)    Slowed heart rate.   . Niacin Other (See Comments)    headaches    Current Outpatient Prescriptions  Medication Sig Dispense Refill  . allopurinol (ZYLOPRIM) 300 MG tablet Take 1 tablet (300 mg total) by mouth daily. 90 tablet 0  . amLODipine (NORVASC) 5 MG tablet Take 2.5 mg by mouth daily.     Marland Kitchen amoxicillin (AMOXIL) 500 MG tablet Take 1 tablet (500 mg total) by mouth 2 (two) times daily. Start date to be provided by dentist Dr. Eula Listen. 14 tablet 0  . atorvastatin (LIPITOR) 20 MG tablet Take 20 mg by mouth at bedtime.    . Calcium-Magnesium-Vitamin D (CALCIUM 1200+D3 PO) Take 2 tablets by mouth daily.     . cetirizine (ZYRTEC) 10 MG tablet Take 10 mg by mouth daily.    . cholestyramine (QUESTRAN) 4 g packet Take 4 g by mouth 3 (three) times daily with meals.    . Cyanocobalamin (VITAMIN B 12 PO) Take 2,500 mg by mouth daily.     . fenofibrate 160 MG tablet Take 160 mg by mouth at bedtime.    . gabapentin (NEURONTIN) 300 MG capsule Take 300 mg by mouth 3 (three) times daily.     Marland Kitchen lidocaine-prilocaine (EMLA) cream Apply 1 application topically as needed. (Patient taking differently: Apply 1 application topically daily as needed (port numbing). ) 30 g 3  . magnesium oxide (MAG-OX) 400 (241.3 MG) MG tablet Take  400 tablets by mouth 2 (two) times daily.   4  . metFORMIN (GLUCOPHAGE) 1000 MG tablet Take 1,000 mg by mouth 2 (two) times daily with a meal.     . Multiple Vitamins-Minerals (MULTIVITAMIN WITH MINERALS) tablet Take 1 tablet by mouth  daily.    . oxyCODONE-acetaminophen (PERCOCET) 5-325 MG tablet Take 1-2 tablets by mouth every 6 (six) hours as needed (for pain; may cause constipation). 20 tablet 0  . Rivaroxaban (XARELTO) 15 MG TABS tablet Take 15 mg by mouth every evening.     . sertraline (ZOLOFT) 100 MG tablet Take 100 mg by mouth daily.    Nelva Nay SOLOSTAR 300 UNIT/ML SOPN INJECT 60 UNITS SUB-Q ONCE DAILY  5  . vitamin C (ASCORBIC ACID) 500 MG tablet Take 500 mg by mouth daily.     No current facility-administered medications for this visit.     Objective: Older white man  In no acute distress Vitals:   04/22/16 1019  BP: 123/63  Pulse: 70  Resp: 18  Temp: 97.9 F (36.6 C)     Body mass index is 29.46 kg/m.      ECOG FS:1 - Symptomatic but completely ambulatory  Sclerae unicteric, EOMs intact Oropharynx clear and moist No cervical or supraclavicular adenopathy, no axillary or inguinal adenopathy Lungs no rales or rhonchi Heart regular rate and rhythm Abd soft, nontender, positive bowel sounds MSK no focal spinal tenderness Neuro: nonfocal, well oriented, appropriate affect  LAB RESULTS:  CMP     Component Value Date/Time   NA 138 03/18/2016 0813   K 4.0 03/18/2016 0813   CL 106 10/29/2015 0625   CO2 22 03/18/2016 0813   GLUCOSE 157 (H) 03/18/2016 0813   BUN 21.9 03/18/2016 0813   CREATININE 1.6 (H) 03/18/2016 0813   CALCIUM 9.8 03/18/2016 0813   PROT 6.7 03/18/2016 0813   ALBUMIN 3.8 03/18/2016 0813   AST 28 03/18/2016 0813   ALT 27 03/18/2016 0813   ALKPHOS 61 03/18/2016 0813   BILITOT 0.31 03/18/2016 0813   GFRNONAA 41 (L) 10/29/2015 0625   GFRAA 47 (L) 10/29/2015 0625    No results found for: SPEP  Lab Results  Component Value Date   WBC 4.9  03/18/2016   NEUTROABS 4.0 03/18/2016   HGB 10.3 (L) 03/18/2016   HCT 31.3 (L) 03/18/2016   MCV 82.8 03/18/2016   PLT 135 (L) 03/18/2016      Chemistry      Component Value Date/Time   NA 138 03/18/2016 0813   K 4.0 03/18/2016 0813   CL 106 10/29/2015 0625   CO2 22 03/18/2016 0813   BUN 21.9 03/18/2016 0813   CREATININE 1.6 (H) 03/18/2016 0813      Component Value Date/Time   CALCIUM 9.8 03/18/2016 0813   ALKPHOS 61 03/18/2016 0813   AST 28 03/18/2016 0813   ALT 27 03/18/2016 0813   BILITOT 0.31 03/18/2016 0813      Urinalysis    Component Value Date/Time   COLORURINE YELLOW 02/04/2015 1649   APPEARANCEUR TURBID (A) 02/04/2015 1649   LABSPEC 1.020 04/24/2015 0846   PHURINE 6.0 04/24/2015 0846   PHURINE 5.5 02/04/2015 1649   GLUCOSEU 250 04/24/2015 0846   HGBUR Large 04/24/2015 0846   HGBUR LARGE (A) 02/04/2015 1649   BILIRUBINUR Negative 04/24/2015 0846   KETONESUR Negative 04/24/2015 0846   KETONESUR NEGATIVE 02/04/2015 1649   PROTEINUR 30 04/24/2015 0846   PROTEINUR 100 (A) 02/04/2015 1649   UROBILINOGEN 0.2 04/24/2015 0846   NITRITE Negative 04/24/2015 0846   NITRITE POSITIVE (A) 02/04/2015 1649   LEUKOCYTESUR Negative 04/24/2015 0846    STUDIES: No results found.  ASSESSMENT:  82 y.o. patient with a diagnosis of    #1 Primary hepatic non-Hodgkin's lymphoma established through liver  biopsy February 2006, treated with Rituxan x9 and then CHOP x6.  All chemotherapy completed in 2007.  Maintenance Rituxan discontinued October 2008.     #2  Biopsy proven retroperitoneal recurrence documented November 2014. Bone marrow biopsy 04/01/2013 was negative   #3 status post  laparoscopic cholecystectomy and port placement on 04/04/2013.  #4 Cycle #1  RICE chemotherapy  completed on 05/02/2013.   #5 cycle #2 RICE chemotherapy  on 05/17/2013  #6 PET scan performed on 05/23/2013 revealed marked partial response to chemotherapy with a retroperitoneal mass decrease  in metabolic activity from SUV of 13.2 to 6.3. Right adrenal mass size and metabolic activity is resolved when compared to the previous PET scan.   #7 Evaluated at [facility] by Dr. Tomasa Hosteller for autologous transplant on 05/27/2013 and was quoted a 3% chance of significant heart damage and possibly death from the treatment and a 50% chance of being alive 5 years from now after transplant (versus perhaps 2 years without). After much thought the patient and family have decided firmly they do not wish to proceed to stem cell transplant.  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 He is going to receive his next dose of rituximab 05/17/2016, and before that treatment and visit he will have a restaging PET scan.   His prostate cancer is followed by Dr. [name] and I will make sure he gets a copy of the PET scan results when available. The last bone scan I have on him is from November 2016 and it showed stable metastatic disease to bone and his left-sided hydroureter.  AC knows to call for any other problems that may develop before his next visit here.   [name], MD   04/23/2016

## 2016-04-24 ENCOUNTER — Other Ambulatory Visit: Payer: Self-pay | Admitting: Oncology

## 2016-04-27 ENCOUNTER — Encounter (HOSPITAL_COMMUNITY): Payer: Self-pay | Admitting: *Deleted

## 2016-04-29 ENCOUNTER — Other Ambulatory Visit (HOSPITAL_BASED_OUTPATIENT_CLINIC_OR_DEPARTMENT_OTHER): Payer: Medicare Other

## 2016-04-29 ENCOUNTER — Encounter (HOSPITAL_COMMUNITY)
Admission: RE | Admit: 2016-04-29 | Discharge: 2016-04-29 | Disposition: A | Discharge status: Home / Self Care | Payer: Medicare Other | Source: Ambulatory Visit | Attending: Oncology | Admitting: Oncology

## 2016-04-29 ENCOUNTER — Encounter: Payer: Self-pay | Admitting: Oncology

## 2016-04-29 DIAGNOSIS — C8219 Follicular lymphoma grade II, extranodal and solid organ sites: Secondary | ICD-10-CM

## 2016-04-29 DIAGNOSIS — C61 Malignant neoplasm of prostate: Secondary | ICD-10-CM | POA: Diagnosis not present

## 2016-04-29 DIAGNOSIS — C8593 Non-Hodgkin lymphoma, unspecified, intra-abdominal lymph nodes: Secondary | ICD-10-CM | POA: Diagnosis present

## 2016-04-29 DIAGNOSIS — C8210 Follicular lymphoma grade II, unspecified site: Secondary | ICD-10-CM | POA: Diagnosis not present

## 2016-04-29 LAB — COMPREHENSIVE METABOLIC PANEL
ALK PHOS: 60 U/L (ref 40–150)
ALT: 34 U/L (ref 0–55)
ANION GAP: 11 meq/L (ref 3–11)
AST: 31 U/L (ref 5–34)
Albumin: 4.2 g/dL (ref 3.5–5.0)
BILIRUBIN TOTAL: 0.52 mg/dL (ref 0.20–1.20)
BUN: 27 mg/dL — ABNORMAL HIGH (ref 7.0–26.0)
CO2: 23 mEq/L (ref 22–29)
Calcium: 9.8 mg/dL (ref 8.4–10.4)
Chloride: 103 mEq/L (ref 98–109)
Creatinine: 1.6 mg/dL — ABNORMAL HIGH (ref 0.7–1.3)
EGFR: 41 mL/min/{1.73_m2} — AB (ref >90)
GLUCOSE: 166 mg/dL — AB (ref 70–140)
POTASSIUM: 4.3 meq/L (ref 3.5–5.1)
SODIUM: 137 meq/L (ref 136–145)
Total Protein: 6.8 g/dL (ref 6.4–8.3)

## 2016-04-29 LAB — CBC WITH DIFFERENTIAL/PLATELET
BASO%: 0.6 % (ref 0.0–2.0)
BASOS ABS: 0 10*3/uL (ref 0.0–0.1)
EOS ABS: 0.3 10*3/uL (ref 0.0–0.5)
EOS%: 4.8 % (ref 0.0–7.0)
HCT: 34.5 % — ABNORMAL LOW (ref 38.4–49.9)
HGB: 11.2 g/dL — ABNORMAL LOW (ref 13.0–17.1)
LYMPH%: 3.9 % — AB (ref 14.0–49.0)
MCH: 27.3 pg (ref 27.2–33.4)
MCHC: 32.6 g/dL (ref 32.0–36.0)
MCV: 83.8 fL (ref 79.3–98.0)
MONO#: 0.4 10*3/uL (ref 0.1–0.9)
MONO%: 6.8 % (ref 0.0–14.0)
NEUT#: 4.5 10*3/uL (ref 1.5–6.5)
NEUT%: 83.9 % — AB (ref 39.0–75.0)
PLATELETS: 150 10*3/uL (ref 140–400)
RBC: 4.12 10*6/uL — AB (ref 4.20–5.82)
RDW: 17.1 % — ABNORMAL HIGH (ref 11.0–14.6)
WBC: 5.3 10*3/uL (ref 4.0–10.3)
lymph#: 0.2 10*3/uL — ABNORMAL LOW (ref 0.9–3.3)

## 2016-04-29 LAB — GLUCOSE, CAPILLARY: GLUCOSE-CAPILLARY: 165 mg/dL — AB (ref 65–99)

## 2016-04-29 LAB — LACTATE DEHYDROGENASE: LDH: 137 U/L (ref 125–245)

## 2016-04-29 MED ORDER — FLUDEOXYGLUCOSE F - 18 (FDG) INJECTION
9.1400 | Freq: Once | INTRAVENOUS | Status: AC | PRN
  Administered 2016-04-29: 9.14 via INTRAVENOUS

## 2016-05-02 ENCOUNTER — Telehealth: Payer: Self-pay | Admitting: *Deleted

## 2016-05-02 NOTE — Telephone Encounter (Signed)
Per staff message I have moved appts on 1/23 closer to together. Notified the scheduler

## 2016-05-17 ENCOUNTER — Other Ambulatory Visit (HOSPITAL_BASED_OUTPATIENT_CLINIC_OR_DEPARTMENT_OTHER): Payer: Medicare Other

## 2016-05-17 ENCOUNTER — Other Ambulatory Visit: Payer: Self-pay | Admitting: Oncology

## 2016-05-17 ENCOUNTER — Ambulatory Visit (HOSPITAL_BASED_OUTPATIENT_CLINIC_OR_DEPARTMENT_OTHER): Payer: Medicare Other

## 2016-05-17 ENCOUNTER — Ambulatory Visit: Payer: Medicare Other | Admitting: Oncology

## 2016-05-17 VITALS — BP 127/65 | HR 74 | Temp 98.5°F | Resp 18

## 2016-05-17 DIAGNOSIS — C8590 Non-Hodgkin lymphoma, unspecified, unspecified site: Secondary | ICD-10-CM

## 2016-05-17 DIAGNOSIS — C8593 Non-Hodgkin lymphoma, unspecified, intra-abdominal lymph nodes: Secondary | ICD-10-CM

## 2016-05-17 DIAGNOSIS — Z5112 Encounter for antineoplastic immunotherapy: Secondary | ICD-10-CM

## 2016-05-17 DIAGNOSIS — C8580 Other specified types of non-Hodgkin lymphoma, unspecified site: Secondary | ICD-10-CM

## 2016-05-17 DIAGNOSIS — C8203 Follicular lymphoma grade I, intra-abdominal lymph nodes: Secondary | ICD-10-CM

## 2016-05-17 DIAGNOSIS — C61 Malignant neoplasm of prostate: Secondary | ICD-10-CM

## 2016-05-17 DIAGNOSIS — C8219 Follicular lymphoma grade II, extranodal and solid organ sites: Secondary | ICD-10-CM

## 2016-05-17 LAB — COMPREHENSIVE METABOLIC PANEL
ALT: 23 U/L (ref 0–55)
ANION GAP: 10 meq/L (ref 3–11)
AST: 24 U/L (ref 5–34)
Albumin: 4 g/dL (ref 3.5–5.0)
Alkaline Phosphatase: 60 U/L (ref 40–150)
BUN: 24.7 mg/dL (ref 7.0–26.0)
CHLORIDE: 108 meq/L (ref 98–109)
CO2: 21 meq/L — AB (ref 22–29)
CREATININE: 1.5 mg/dL — AB (ref 0.7–1.3)
Calcium: 9.7 mg/dL (ref 8.4–10.4)
EGFR: 43 mL/min/{1.73_m2} — ABNORMAL LOW (ref >90)
Glucose: 179 mg/dl — ABNORMAL HIGH (ref 70–140)
Potassium: 4.1 mEq/L (ref 3.5–5.1)
Sodium: 139 mEq/L (ref 136–145)
Total Bilirubin: 0.31 mg/dL (ref 0.20–1.20)
Total Protein: 6.5 g/dL (ref 6.4–8.3)

## 2016-05-17 LAB — CBC WITH DIFFERENTIAL/PLATELET
BASO%: 0.5 % (ref 0.0–2.0)
Basophils Absolute: 0 10*3/uL (ref 0.0–0.1)
EOS%: 4.6 % (ref 0.0–7.0)
Eosinophils Absolute: 0.2 10*3/uL (ref 0.0–0.5)
HCT: 32.1 % — ABNORMAL LOW (ref 38.4–49.9)
HGB: 10.7 g/dL — ABNORMAL LOW (ref 13.0–17.1)
LYMPH%: 4.9 % — ABNORMAL LOW (ref 14.0–49.0)
MCH: 27.7 pg (ref 27.2–33.4)
MCHC: 33.2 g/dL (ref 32.0–36.0)
MCV: 83.5 fL (ref 79.3–98.0)
MONO#: 0.4 10*3/uL (ref 0.1–0.9)
MONO%: 7.8 % (ref 0.0–14.0)
NEUT#: 4.3 10*3/uL (ref 1.5–6.5)
NEUT%: 82.2 % — ABNORMAL HIGH (ref 39.0–75.0)
Platelets: 144 10*3/uL (ref 140–400)
RBC: 3.85 10*6/uL — ABNORMAL LOW (ref 4.20–5.82)
RDW: 16.4 % — ABNORMAL HIGH (ref 11.0–14.6)
WBC: 5.2 10*3/uL (ref 4.0–10.3)
lymph#: 0.3 10*3/uL — ABNORMAL LOW (ref 0.9–3.3)

## 2016-05-17 LAB — LACTATE DEHYDROGENASE: LDH: 131 U/L (ref 125–245)

## 2016-05-17 MED ORDER — ACETAMINOPHEN 325 MG PO TABS
650.0000 mg | ORAL_TABLET | Freq: Once | ORAL | Status: AC
  Administered 2016-05-17: 650 mg via ORAL

## 2016-05-17 MED ORDER — SODIUM CHLORIDE 0.9 % IV SOLN
Freq: Once | INTRAVENOUS | Status: AC
  Administered 2016-05-17: 09:00:00 via INTRAVENOUS

## 2016-05-17 MED ORDER — SODIUM CHLORIDE 0.9 % IV SOLN
350.0000 mg/m2 | Freq: Once | INTRAVENOUS | Status: AC
  Administered 2016-05-17: 700 mg via INTRAVENOUS
  Filled 2016-05-17: qty 50

## 2016-05-17 MED ORDER — SODIUM CHLORIDE 0.9 % IJ SOLN
10.0000 mL | Freq: Once | INTRAMUSCULAR | Status: AC
  Administered 2016-05-17: 10 mL
  Filled 2016-05-17: qty 10

## 2016-05-17 MED ORDER — SODIUM CHLORIDE 0.9 % IJ SOLN
10.0000 mL | INTRAMUSCULAR | Status: DC | PRN
Start: 2016-05-17 — End: 2016-05-17
  Administered 2016-05-17: 10 mL
  Filled 2016-05-17: qty 10

## 2016-05-17 MED ORDER — HEPARIN SOD (PORK) LOCK FLUSH 100 UNIT/ML IV SOLN
500.0000 [IU] | Freq: Once | INTRAVENOUS | Status: AC | PRN
  Administered 2016-05-17: 500 [IU]
  Filled 2016-05-17: qty 5

## 2016-05-17 MED ORDER — DIPHENHYDRAMINE HCL 25 MG PO CAPS
ORAL_CAPSULE | ORAL | Status: AC
  Filled 2016-05-17: qty 1

## 2016-05-17 MED ORDER — ACETAMINOPHEN 325 MG PO TABS
ORAL_TABLET | ORAL | Status: AC
  Filled 2016-05-17: qty 2

## 2016-05-17 MED ORDER — DIPHENHYDRAMINE HCL 25 MG PO CAPS
25.0000 mg | ORAL_CAPSULE | Freq: Once | ORAL | Status: AC
  Administered 2016-05-17: 25 mg via ORAL

## 2016-05-17 NOTE — Progress Notes (Signed)
Pt and VS stable at discharge. 

## 2016-05-17 NOTE — Patient Instructions (Signed)
[facility] Discharge Instructions for Patients Receiving Chemotherapy  Today you received the following chemotherapy agents: Rituxan   To help prevent nausea and vomiting after your treatment, we encourage you to take your nausea medication as directed.    If you develop nausea and vomiting that is not controlled by your nausea medication, call the clinic.   BELOW ARE SYMPTOMS THAT SHOULD BE REPORTED IMMEDIATELY:  *FEVER GREATER THAN 100.5 F  *CHILLS WITH OR WITHOUT FEVER  NAUSEA AND VOMITING THAT IS NOT CONTROLLED WITH YOUR NAUSEA MEDICATION  *UNUSUAL [name]  *UNUSUAL BRUISING OR BLEEDING  TENDERNESS IN MOUTH AND THROAT WITH OR WITHOUT PRESENCE OF ULCERS  *URINARY PROBLEMS  *BOWEL PROBLEMS  UNUSUAL RASH Items with * indicate a potential emergency and should be followed up as soon as possible.  Feel free to call the clinic you have any questions or concerns. The clinic phone number is [phone].  Please show the CHEMO ALERT CARD at check-in to the Emergency Department and triage nurse.   

## 2016-05-18 DIAGNOSIS — C61 Malignant neoplasm of prostate: Secondary | ICD-10-CM | POA: Diagnosis not present

## 2016-05-18 DIAGNOSIS — N1330 Unspecified hydronephrosis: Secondary | ICD-10-CM | POA: Diagnosis not present

## 2016-05-19 DIAGNOSIS — N1330 Unspecified hydronephrosis: Secondary | ICD-10-CM | POA: Diagnosis not present

## 2016-05-19 DIAGNOSIS — R310 Gross hematuria: Secondary | ICD-10-CM | POA: Diagnosis not present

## 2016-05-19 DIAGNOSIS — Z85828 Personal history of other malignant neoplasm of skin: Secondary | ICD-10-CM | POA: Diagnosis not present

## 2016-05-19 DIAGNOSIS — D045 Carcinoma in situ of skin of trunk: Secondary | ICD-10-CM | POA: Diagnosis not present

## 2016-05-19 DIAGNOSIS — L570 Actinic keratosis: Secondary | ICD-10-CM | POA: Diagnosis not present

## 2016-05-19 DIAGNOSIS — D044 Carcinoma in situ of skin of scalp and neck: Secondary | ICD-10-CM | POA: Diagnosis not present

## 2016-05-19 DIAGNOSIS — D485 Neoplasm of uncertain behavior of skin: Secondary | ICD-10-CM | POA: Diagnosis not present

## 2016-05-23 NOTE — Patient Instructions (Signed)
[name]  05/23/2016   Your procedure is scheduled on: 05/26/2016    Report to [facility]  Entrance take [facility]  elevators to 3rd floor to  [place] at     145pm  Call this number if you have problems the morning of surgery [phone]   Remember: ONLY 1 PERSON MAY GO WITH YOU TO SHORT STAY TO GET  READY MORNING OF YOUR SURGERY.  Do not eat food after midnite.  May have clear liquids from 12 midnite until 0900am morning of surgery then nothing by mouth.       Take these medicines the morning of surgery with A SIP OF WATER: Allopurinol, Amlodipine ( NOrvasc), Zyrtec, Percocet if needed  DO NOT TAKE ANY DIABETIC MEDICATIONS DAY OF YOUR SURGERY                               You may not have any metal on your body including hair pins and              piercings  Do not wear jewelry,  lotions, powders or perfumes, deodorant                        Men may shave face and neck.   Do not bring valuables to the hospital. [place].  Contacts, dentures or bridgework may not be worn into surgery.       Patients discharged the day of surgery will not be allowed to drive home.  Name and phone number of your driver:  Special Instructions: N/A              Please read over the following fact sheets you were given: _____________________________________________________________________                CLEAR LIQUID DIET   Foods Allowed                                                                     Foods Excluded  Coffee and tea, regular and decaf                             liquids that you cannot  Plain Jell-O in any flavor                                             see through such as: Fruit ices (not with fruit pulp)                                     milk, soups, orange juice  Iced Popsicles  All solid food Carbonated beverages, regular and diet                                     Cranberry, grape and apple juices Sports drinks like Gatorade Lightly seasoned clear broth or consume(fat free) Sugar, honey syrup  Sample Menu Breakfast                                Lunch                                     Supper Cranberry juice                    Beef broth                            Chicken broth Jell-O                                     Grape juice                           Apple juice Coffee or tea                        Jell-O                                      Popsicle                                                Coffee or tea                        Coffee or tea  _____________________________________________________________________  [facility] - Preparing for Surgery Before surgery, you can play an important role.  Because skin is not sterile, your skin needs to be as free of germs as possible.  You can reduce the number of germs on your skin by washing with CHG (chlorahexidine gluconate) soap before surgery.  CHG is an antiseptic cleaner which kills germs and bonds with the skin to continue killing germs even after washing. Please DO NOT use if you have an allergy to CHG or antibacterial soaps.  If your skin becomes reddened/irritated stop using the CHG and inform your nurse when you arrive at Short Stay. Do not shave (including legs and underarms) for at least 48 hours prior to the first CHG shower.  You may shave your face/neck. Please follow these instructions carefully:  1.  Shower with CHG Soap the night before surgery and the  morning of Surgery.  2.  If you choose to wash your hair, wash your hair first as usual with your  normal  shampoo.  3.  After you shampoo, rinse your hair and body thoroughly to remove the  shampoo.  4.  Use CHG as you would any other liquid soap.  You can apply chg directly  to the skin and wash                       Gently with a scrungie or clean washcloth.  5.  Apply the CHG  Soap to your body ONLY FROM THE NECK DOWN.   Do not use on face/ open                           Wound or open sores. Avoid contact with eyes, ears mouth and genitals (private parts).                       Wash face,  Genitals (private parts) with your normal soap.             6.  Wash thoroughly, paying special attention to the area where your surgery  will be performed.  7.  Thoroughly rinse your body with warm water from the neck down.  8.  DO NOT shower/wash with your normal soap after using and rinsing off  the CHG Soap.                9.  Pat yourself dry with a clean towel.            10.  Wear clean pajamas.            11.  Place clean sheets on your bed the night of your first shower and do not  sleep with pets. Day of Surgery : Do not apply any lotions/deodorants the morning of surgery.  Please wear clean clothes to the hospital/surgery center.  FAILURE TO FOLLOW THESE INSTRUCTIONS MAY RESULT IN THE CANCELLATION OF YOUR SURGERY PATIENT SIGNATURE_________________________________  NURSE SIGNATURE__________________________________  ________________________________________________________________________ How to Manage Your Diabetes Before and After Surgery  Why is it important to control my blood sugar before and after surgery? . Improving blood sugar levels before and after surgery helps healing and can limit problems. . A way of improving blood sugar control is eating a healthy diet by: o  Eating less sugar and carbohydrates o  Increasing activity/exercise o  Talking with your doctor about reaching your blood sugar goals . High blood sugars (greater than 180 mg/dL) can raise your risk of infections and slow your recovery, so you will need to focus on controlling your diabetes during the weeks before surgery. . Make sure that the doctor who takes care of your diabetes knows about your planned surgery including the date and location.  How do I manage my blood sugar before  surgery? . Check your blood sugar at least 4 times a day, starting 2 days before surgery, to make sure that the level is not too high or low. o Check your blood sugar the morning of your surgery when you wake up and every 2 hours until you get to the Short Stay unit. . If your blood sugar is less than 70 mg/dL, you will need to treat for low blood sugar: o Do not take insulin. o Treat a low blood sugar (less than 70 mg/dL) with  cup of clear juice (cranberry or apple), 4 glucose tablets, OR glucose gel. o Recheck blood sugar in 15 minutes after treatment (to make sure it is greater than 70 mg/dL). If your blood sugar is not greater than 70 mg/dL on recheck, call  [phone] for further instructions. . Report your blood sugar to the short stay nurse when you get to Short Stay.  . If you are admitted to the hospital after surgery: o Your blood sugar will be checked by the staff and you will probably be given insulin after surgery (instead of oral diabetes medicines) to make sure you have good blood sugar levels. o The goal for blood sugar control after surgery is 80-180 mg/dL.   WHAT DO I DO ABOUT MY DIABETES MEDICATION?  Marland Kitchen Do not take oral diabetes medicines (pills) the morning of surgery.  .       .   .   .  .      Patient Signature:  Date:   Nurse Signature:  Date:   Reviewed and Endorsed by [facility] Patient Education Committee, August 2015

## 2016-05-24 ENCOUNTER — Encounter (HOSPITAL_COMMUNITY): Payer: Self-pay

## 2016-05-24 ENCOUNTER — Inpatient Hospital Stay (HOSPITAL_COMMUNITY)
Admission: RE | Admit: 2016-05-24 | Discharge: 2016-05-24 | Disposition: A | Discharge status: Home / Self Care | Payer: Medicare Other | Source: Ambulatory Visit

## 2016-05-24 NOTE — Progress Notes (Signed)
Left [name] at [facility] a message regarding why patient was a same day due to daughter's work schedule and told her I had routed a note regarding this via epic to Dr [name].

## 2016-05-24 NOTE — Progress Notes (Signed)
Patient had preop appt for 05/24/16 at 0900am.  Daughter, [name] white called and stated she could not bring him due to her work schedule.  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  Completed Same Day over phone with preop instructions given to daughter, [name].  Patient to arrive at 1315pm and will only need CBC and BMP upon arrival.  Daughter is aware patient is to eat bedtime snack ( protein) prior to bedtime and reviewed clear liquids with her until 23am with understanding.  Daughter is aware to keep watch on patient's glucose and if glucose starts to trend toward the low end of normal she is to bring patient in prior to 1315pm on 05/26/2016.

## 2016-05-25 NOTE — H&P (Signed)
Office Visit Report     05/18/2016   --------------------------------------------------------------------------------   [name]  MRN: [id number]  PRIMARY CARE:  [name], MD  DOB: 05/03/1931, 83 year old Male  REFERRING:  Raynelle Bring, MD  SSN: [id number]  PROVIDER:  Raynelle Bring, M.D.    LOCATION:  [facility] [phone]   --------------------------------------------------------------------------------   CC/HPI: Metastatic prostate cancer/bilateral ureteral obstruction   He returns today for management of his metastatic prostate cancer. He continues to tolerate androgen deprivation therapy quite well. He denies any significant fatigue or hot flashes. He continues to receive chemotherapy for B-cell lymphoma and has tolerated this quite well. He has tolerated his stents extremely well over the past 6 months with minimal voiding symptoms and no gross hematuria or infection.     ALLERGIES: Niacin ER CPCR    MEDICATIONS: Allopurinol 300 MG Oral Tablet Oral  AmLODIPine Besylate 5 MG Oral Tablet Oral  Atorvastatin Calcium 20 MG Oral Tablet Oral  Besivance 0.6 % Ophthalmic Suspension Ophthalmic  Calcium + D3 600-200 MG-UNIT Oral Tablet Oral  Cefuroxime Axetil 250 MG Oral Tablet Oral  Cetirizine HCl - 10 MG Oral Tablet Oral  Cholestyramine 4 GM/DOSE Oral Powder Oral  Durezol 0.05 % Ophthalmic Emulsion Ophthalmic  Fenofibrate 160 MG Oral Tablet Oral  Gabapentin 300 MG Oral Capsule Oral  HumuLIN N 100 UNIT/ML Subcutaneous Suspension Subcutaneous  Hydrocodone-Acetaminophen 5-325 MG Oral Tablet Oral  Lupron Depot 30 mg (4 month) syringe kit 30 mg IM Administered by: Cristobal Goldmann  Magnesium Oxide TABS Oral  MetFORMIN HCl - 1000 MG Oral Tablet Oral  Multi-Vitamin TABS Oral  Questran 4 GM/DOSE Oral Powder Oral  Sertraline HCl - 100 MG Oral Tablet Oral  Toujeo SoloStar 300 UNIT/ML Subcutaneous Solution Pen-injector Subcutaneous  Vitamin B-12 TABS Oral   Vitamin C TABS Oral  Vitamin D TABS Oral  Xarelto 15 MG Oral Tablet Oral  Zetia 10 MG Oral Tablet Oral     GU PSH: Cystoscopy Insert Stent, Bilateral - 10/29/2015, 06/08/2015, 02/23/2015 Cystoscopy TURBT <2 cm - 02/23/2015 Remove Prostate. - 2014      PSH Notes: Cystoscopy With Insertion Of Ureteral Stent Bilateral, Cystoscopy With Insertion Of Ureteral Stent Bilateral, Cystoscopy With Fulguration Small Lesion (5-69m), Ankle Repair, Kidney Surgery, Prostatectomy, Retropubic, Shoulder Surgery   NON-GU PSH: None   GU PMH: Hydronephrosis Unspec - 02/10/2016 Prostate Cancer, Adenocarcinoma of prostate - 08/19/2015 Kidney Failure, acute, Unspec, Acute kidney injury - 04/19/2015 Urinary Tract Inf, Unspec site, Urinary tract infection - 02/19/2015 Gross hematuria, Gross hematuria - 01/28/2015 Urinary Urgency, Urinary urgency - 01/28/2015      PMH Notes:   1) Prostate cancer: He is s/p primary surgical with a radical prostatectomy in 2001 for pT3b N0 Mx, Gleason 3+4=7 adenocarcinoma of the prostate. He developed a biochemical recurrence in December 2006 when his PSA was noted to be 0.6. His PSA had increased slowly and was only 1.4 in January 2010 but further increased to 8.01 in November 2014. At that time, a CT scan was performed that demonstrated a retroperitoneal mass/lymphadenopathy and it was found to be recurrent B cell lymphoma. He had initially been diagnosed with lymphoma in 2006 and was treated with CHOP chemotherapy and rituximab which he stopped in 2008. After his recurrence of lymphoma, he was given options and refused a stem cell transplant and was again treated with chemotherapy and maintenance rituximab. His PSA at the time of his initial consultation with me in November 2015 was 15.83.  After reviewing options for management, he and his family wished to avoid systemic therapy unless absolutely necessary. He developed measurable metastatic disease to the bone in November 2016 and  bilateral ureteral obstruction due to locally advanced prostate cancer. He began systemic ADT in November 2016.   Nov 2016: Began systemic ADT for measurable metastatic disease   2) Urinary retention: He has a history of urinary retention after a laparoscopic cholecystectomy in December 2014 but passed a voiding trial without problems since then.   3) Bilateral ureteral obstruction: He was incidentally noted to have left hydronephrosis on PET imaging for his lymphoma and confirmed on his bone scan imaging for prostate cancer in early 2016. No clear etiology for obstruction was noted on his imaging including no lymphadenopathy. After a discussion with his family including a discussion about renogram imaging to determine if there was obstruction present, he and his family chose to proceed with observation and avoid further imaging as they wished to avoid any intervention (such as stenting) understanding the risk of loss of renal function that may occur. In October 2016, he was noted to have development of bilateral hydronephrosis and worsening renal function prompting cystoscopy that revealed a bladder mass as the likely cause of obstruction. He required bilateral ureteral stenting in October 2016 with stabilization of his renal function.   Last stent change: 10/29/15     NON-GU PMH: Lymphoma, History, History of malignant lymphoma - 04/19/2015 Personal history of other diseases of the circulatory system, History of hypertension - 2014 Personal history of other diseases of the musculoskeletal system and connective tissue, History of arthritis - 2014 Personal history of other diseases of the nervous system and sense organs, History of sleep apnea - 2014 Personal history of other endocrine, nutritional and metabolic disease, History of diabetes mellitus - 2014, History of hyperlipidemia, - 2014    FAMILY HISTORY: cardiac disorder - Runs In Family Diabetes - Runs In Family   SOCIAL HISTORY: Marital  Status: Married Current Smoking Status: Patient does not smoke anymore. Has not smoked since 04/29/2016.   Tobacco Use Assessment Completed: Used Tobacco in last 30 days? Does not drink anymore.  Drinks 1 caffeinated drink per day.     Notes: Former smoker, Retired, Married, Non-smoker   REVIEW OF SYSTEMS:    GU Review Male:   Patient reports get up at night to urinate. Patient denies frequent urination, hard to postpone urination, burning/ pain with urination, leakage of urine, stream starts and stops, trouble starting your streams, and have to strain to urinate .  Gastrointestinal (Lower):   Patient denies diarrhea and constipation.  Gastrointestinal (Upper):   Patient denies nausea and vomiting.  Constitutional:   Patient denies fever, night sweats, weight loss, and fatigue.  Skin:   Patient reports skin rash/ lesion. Patient denies itching.  Eyes:   Patient denies double vision and blurred vision.  Ears/ Nose/ Throat:   Patient denies sore throat and sinus problems.  Hematologic/Lymphatic:   Patient denies swollen glands and easy bruising.  Cardiovascular:   Patient denies leg swelling and chest pains.  Respiratory:   Patient denies cough and shortness of breath.  Endocrine:   Patient denies excessive thirst.  Musculoskeletal:   Patient denies back pain and joint pain.  Neurological:   Patient denies headaches and dizziness.  Psychologic:   Patient denies depression and anxiety.   VITAL SIGNS:      05/18/2016 11:21 AM  Weight 180 lb / 81.65 kg  Height 68 in /  172.72 cm  BP 125/69 mmHg  Pulse 80 /min  BMI 27.4 kg/m   MULTI-SYSTEM PHYSICAL EXAMINATION:    Constitutional: Well-nourished. No physical deformities. Normally developed. Good grooming.  Respiratory: No labored breathing, no use of accessory muscles. Clear.  Cardiovascular: Normal temperature, normal extremity pulses, no swelling, no varicosities. RRR.     PAST DATA REVIEWED:  Source Of History:  Patient  Urine Test  Review:   Urinalysis   01/06/16 08/29/15 05/02/15 02/20/15 01/23/15 10/02/14 05/20/14 04/16/13  PSA  Total PSA 0.04 ng/dl 0.22  0.74  12.58  31.48  20.75  20.62  7.99     01/06/16 08/28/15 04/11/05 01/12/05 01/09/04  Hormones  Testosterone, Total 30.9 pg/dL 36  2.43  1.89  1.61     PROCEDURES:          Urinalysis w/Scope Dipstick Dipstick Cont'd Micro  Color: Yellow Bilirubin: Neg WBC/hpf: 0 - 5/hpf  Appearance: Clear Ketones: Neg RBC/hpf: 3 - 10/hpf  Specific Gravity: 1.025 Blood: 1+ Bacteria: Rare (0-9/hpf)  pH: 6.0 Protein: 1+ Cystals: NS (Not Seen)  Glucose: Neg Urobilinogen: 0.2 Casts: NS (Not Seen)    Nitrites: Neg Trichomonas: Not Present    Leukocyte Esterase: Neg Mucous: Not Present      Epithelial Cells: 0 - 5/hpf      Yeast: NS (Not Seen)      Sperm: Not Present         Lupron Depot 45 mg / 6 Month - [id number], [id number] The hip was sterilely prepped with alcohol. Lupron was injected intramuscularly (IM) using standard technique. The patient tolerated the procedure well. A band aid was applied. The site was dry when the patient left the exam room. The patient will return as scheduled for his next Lupron injection.   After treatment was administered, patient was observed to be symptom-free for 20 minutes.   Qty: 45 Adm. By: Cristobal Goldmann  Unit: mg Lot No [id number]  Route: IM Exp. Date 09/25/2017  Freq: None Mfgr.:   Site: Right Buttock   ASSESSMENT:      ICD-10 Details  1 GU:   Prostate Cancer - C61   2   Hydronephrosis Unspec - N13.30    PLAN:           Orders Labs PSA, Urine Culture and Sensitivity          Schedule Labs: 6 Months - Urinalysis  Return Visit/Planned Activity: 6 Months - Office Visit          Document Letter(s):  Created for Patient: Clinical Summary         Notes:   1. Metastatic prostate cancer: His PSA will be checked today. He will receive Lupron 45 mg. Assuming that his PSA remains quite low, he will follow up in 6 months with his next  PSA. We will alter this plan pending his PSA.   2. Bilateral ureteral obstruction: He is scheduled to proceed with cystoscopy and bilateral ureteral stent change on February 1. We have reviewed potential risks, complications, and expected recovery process. He is well informed through his prior procedures. He gives informed consent to proceed. We will continue managing him with stent changes approximately every 6 months assuming he does not have significant encrustation noted at the time of his upcoming stent change.   Cc: Dr. [name]    * Signed by Raynelle Bring, M.D. on 05/18/16 at 6:16 PM (EST)*

## 2016-05-26 ENCOUNTER — Encounter (HOSPITAL_COMMUNITY): Payer: Self-pay | Admitting: *Deleted

## 2016-05-26 ENCOUNTER — Ambulatory Visit (HOSPITAL_COMMUNITY)
Admission: RE | Admit: 2016-05-26 | Discharge: 2016-05-26 | Disposition: A | Discharge status: Home / Self Care | Payer: Medicare Other | Source: Ambulatory Visit | Attending: Urology | Admitting: Urology

## 2016-05-26 ENCOUNTER — Telehealth: Payer: Self-pay

## 2016-05-26 ENCOUNTER — Ambulatory Visit (HOSPITAL_COMMUNITY): Payer: Medicare Other | Admitting: Certified Registered Nurse Anesthetist

## 2016-05-26 ENCOUNTER — Encounter (HOSPITAL_COMMUNITY)
Admission: RE | Disposition: A | Discharge status: Home / Self Care | Payer: Self-pay | Source: Ambulatory Visit | Attending: Urology

## 2016-05-26 DIAGNOSIS — N1330 Unspecified hydronephrosis: Secondary | ICD-10-CM | POA: Diagnosis not present

## 2016-05-26 DIAGNOSIS — Z7901 Long term (current) use of anticoagulants: Secondary | ICD-10-CM | POA: Diagnosis not present

## 2016-05-26 DIAGNOSIS — G4730 Sleep apnea, unspecified: Secondary | ICD-10-CM | POA: Diagnosis not present

## 2016-05-26 DIAGNOSIS — Z87891 Personal history of nicotine dependence: Secondary | ICD-10-CM | POA: Insufficient documentation

## 2016-05-26 DIAGNOSIS — I10 Essential (primary) hypertension: Secondary | ICD-10-CM | POA: Diagnosis not present

## 2016-05-26 DIAGNOSIS — I480 Paroxysmal atrial fibrillation: Secondary | ICD-10-CM | POA: Diagnosis not present

## 2016-05-26 DIAGNOSIS — Z79899 Other long term (current) drug therapy: Secondary | ICD-10-CM | POA: Insufficient documentation

## 2016-05-26 DIAGNOSIS — Z7984 Long term (current) use of oral hypoglycemic drugs: Secondary | ICD-10-CM | POA: Insufficient documentation

## 2016-05-26 DIAGNOSIS — C61 Malignant neoplasm of prostate: Secondary | ICD-10-CM | POA: Insufficient documentation

## 2016-05-26 DIAGNOSIS — E119 Type 2 diabetes mellitus without complications: Secondary | ICD-10-CM | POA: Diagnosis not present

## 2016-05-26 DIAGNOSIS — M1990 Unspecified osteoarthritis, unspecified site: Secondary | ICD-10-CM | POA: Insufficient documentation

## 2016-05-26 DIAGNOSIS — I4891 Unspecified atrial fibrillation: Secondary | ICD-10-CM | POA: Insufficient documentation

## 2016-05-26 DIAGNOSIS — N135 Crossing vessel and stricture of ureter without hydronephrosis: Secondary | ICD-10-CM | POA: Diagnosis not present

## 2016-05-26 DIAGNOSIS — D696 Thrombocytopenia, unspecified: Secondary | ICD-10-CM | POA: Diagnosis not present

## 2016-05-26 HISTORY — PX: CYSTOSCOPY W/ URETERAL STENT PLACEMENT: SHX1429

## 2016-05-26 LAB — BASIC METABOLIC PANEL
ANION GAP: 8 (ref 5–15)
BUN: 22 mg/dL — ABNORMAL HIGH (ref 6–20)
CALCIUM: 9.4 mg/dL (ref 8.9–10.3)
CO2: 24 mmol/L (ref 22–32)
Chloride: 105 mmol/L (ref 101–111)
Creatinine, Ser: 1.31 mg/dL — ABNORMAL HIGH (ref 0.61–1.24)
GFR, EST AFRICAN AMERICAN: 57 mL/min — AB (ref >60)
GFR, EST NON AFRICAN AMERICAN: 49 mL/min — AB (ref >60)
Glucose, Bld: 118 mg/dL — ABNORMAL HIGH (ref 65–99)
Potassium: 4.3 mmol/L (ref 3.5–5.1)
SODIUM: 137 mmol/L (ref 135–145)

## 2016-05-26 LAB — CBC
HCT: 31.7 % — ABNORMAL LOW (ref 39.0–52.0)
HEMOGLOBIN: 10.8 g/dL — AB (ref 13.0–17.0)
MCH: 28.3 pg (ref 26.0–34.0)
MCHC: 34.1 g/dL (ref 30.0–36.0)
MCV: 83.2 fL (ref 78.0–100.0)
Platelets: 129 10*3/uL — ABNORMAL LOW (ref 150–400)
RBC: 3.81 MIL/uL — AB (ref 4.22–5.81)
RDW: 15.7 % — ABNORMAL HIGH (ref 11.5–15.5)
WBC: 4.5 10*3/uL (ref 4.0–10.5)

## 2016-05-26 SURGERY — CYSTOSCOPY, FLEXIBLE, WITH STENT REPLACEMENT
Anesthesia: General | Laterality: Bilateral

## 2016-05-26 MED ORDER — FENTANYL CITRATE (PF) 100 MCG/2ML IJ SOLN: 25.0000 ug | INTRAMUSCULAR | Status: DC | PRN

## 2016-05-26 MED ORDER — CEFAZOLIN SODIUM-DEXTROSE 2-4 GM/100ML-% IV SOLN
2.0000 g | INTRAVENOUS | Status: AC
  Administered 2016-05-26: 2 g via INTRAVENOUS

## 2016-05-26 MED ORDER — FENTANYL CITRATE (PF) 100 MCG/2ML IJ SOLN
INTRAMUSCULAR | Status: AC
  Filled 2016-05-26: qty 2

## 2016-05-26 MED ORDER — ONDANSETRON HCL 4 MG/2ML IJ SOLN
INTRAMUSCULAR | Status: AC
  Filled 2016-05-26: qty 2

## 2016-05-26 MED ORDER — LIDOCAINE 2% (20 MG/ML) 5 ML SYRINGE
INTRAMUSCULAR | Status: AC
  Filled 2016-05-26: qty 5

## 2016-05-26 MED ORDER — LACTATED RINGERS IV SOLN
INTRAVENOUS | Status: DC
Start: 2016-05-26 — End: 2016-05-26
  Administered 2016-05-26: 14:00:00 via INTRAVENOUS

## 2016-05-26 MED ORDER — PROPOFOL 10 MG/ML IV BOLUS
INTRAVENOUS | Status: AC
  Filled 2016-05-26: qty 20

## 2016-05-26 MED ORDER — CEFAZOLIN SODIUM-DEXTROSE 2-4 GM/100ML-% IV SOLN
INTRAVENOUS | Status: AC
  Filled 2016-05-26: qty 100

## 2016-05-26 MED ORDER — ONDANSETRON HCL 4 MG/2ML IJ SOLN
INTRAMUSCULAR | Status: DC | PRN
  Administered 2016-05-26: 4 mg via INTRAVENOUS

## 2016-05-26 MED ORDER — FENTANYL CITRATE (PF) 100 MCG/2ML IJ SOLN
INTRAMUSCULAR | Status: DC | PRN
  Administered 2016-05-26: 25 ug via INTRAVENOUS

## 2016-05-26 MED ORDER — PROPOFOL 10 MG/ML IV BOLUS
INTRAVENOUS | Status: DC | PRN
Start: 2016-05-26 — End: 2016-05-26
  Administered 2016-05-26: 100 mg via INTRAVENOUS

## 2016-05-26 MED ORDER — SODIUM CHLORIDE 0.9 % IR SOLN
Status: DC | PRN
  Administered 2016-05-26: 3000 mL

## 2016-05-26 MED ORDER — LIDOCAINE 2% (20 MG/ML) 5 ML SYRINGE
INTRAMUSCULAR | Status: DC | PRN
  Administered 2016-05-26: 80 mg via INTRAVENOUS

## 2016-05-26 SURGICAL SUPPLY — 11 items
BAG URO CATCHER STRL LF (MISCELLANEOUS) ×3 IMPLANT
CATH INTERMIT  6FR 70CM (CATHETERS) IMPLANT
CLOTH BEACON ORANGE TIMEOUT ST (SAFETY) ×3 IMPLANT
GLOVE BIOGEL M STRL SZ7.5 (GLOVE) ×3 IMPLANT
GOWN STRL REUS W/TWL LRG LVL3 (GOWN DISPOSABLE) ×6 IMPLANT
GUIDEWIRE STR DUAL SENSOR (WIRE) IMPLANT
MANIFOLD NEPTUNE II (INSTRUMENTS) ×3 IMPLANT
PACK CYSTO (CUSTOM PROCEDURE TRAY) ×3 IMPLANT
STENT URET 6FRX24 CONTOUR (STENTS) ×6 IMPLANT
TUBING CONNECTING 10 (TUBING) ×2 IMPLANT
TUBING CONNECTING 10' (TUBING) ×1

## 2016-05-26 NOTE — Discharge Instructions (Addendum)
General Anesthesia, Adult, Care After °These instructions provide you with information about caring for yourself after your procedure. Your health care provider may also give you more specific instructions. Your treatment has been planned according to current medical practices, but problems sometimes occur. Call your health care provider if you have any problems or questions after your procedure. °What can I expect after the procedure? °After the procedure, it is common to have: °Vomiting. °A sore throat. °Mental slowness. °It is common to feel: °Nauseous. °Cold or shivery. °Sleepy. °Tired. °Sore or achy, even in parts of your body where you did not have surgery. °Follow these instructions at home: °For at least 24 hours after the procedure:  °Do not: °Participate in activities where you could fall or become injured. °Drive. °Use heavy machinery. °Drink alcohol. °Take sleeping pills or medicines that cause drowsiness. °Make important decisions or sign legal documents. °Take care of children on your own. °Rest. °Eating and drinking  °If you vomit, drink water, juice, or soup when you can drink without vomiting. °Drink enough fluid to keep your urine clear or pale yellow. °Make sure you have little or no nausea before eating solid foods. °Follow the diet recommended by your health care provider. °General instructions  °Have a responsible adult stay with you until you are awake and alert. °Return to your normal activities as told by your health care provider. Ask your health care provider what activities are safe for you. °Take over-the-counter and prescription medicines only as told by your health care provider. °If you smoke, do not smoke without supervision. °Keep all follow-up visits as told by your health care provider. This is important. °Contact a health care provider if: °You continue to have nausea or vomiting at home, and medicines are not helpful. °You cannot drink fluids or start eating again. °You cannot  urinate after 8-12 hours. °You develop a skin rash. °You have fever. °You have increasing redness at the site of your procedure. °Get help right away if: °You have difficulty breathing. °You have chest pain. °You have unexpected bleeding. °You feel that you are having a life-threatening or urgent problem. °This information is not intended to replace advice given to you by your health care provider. Make sure you discuss any questions you have with your health care provider. °Document Released: 07/18/2000 Document Revised: 09/14/2015 Document Reviewed: 03/26/2015 °Elsevier Interactive Patient Education © 2017 [employer] ° ° ° ° °1. You may see some blood in the urine and may have some burning with urination for 48-72 hours. You also may notice that you have to urinate more frequently or urgently after your procedure which is normal.  °2. You should call should you develop an inability urinate, fever > 101, persistent nausea and vomiting that prevents you from eating or drinking to stay hydrated.  °3. If you have a stent, you will likely urinate more frequently and urgently until the stent is removed and you may experience some discomfort/pain in the lower abdomen and flank especially when urinating. You may take pain medication prescribed to you if needed for pain. You may also intermittently have blood in the urine until the stent is removed. ° ° °

## 2016-05-26 NOTE — Anesthesia Postprocedure Evaluation (Signed)
Anesthesia Post Note  Patient: [name]  Procedure(s) Performed: Procedure(s) (LRB): CYSTOSCOPY WITH STENT REPLACEMENT (Bilateral)  Patient location during evaluation: PACU Anesthesia Type: General Level of consciousness: awake and alert Pain management: pain level controlled Vital Signs Assessment: post-procedure vital signs reviewed and stable Respiratory status: spontaneous breathing, nonlabored ventilation and respiratory function stable Cardiovascular status: blood pressure returned to baseline and stable Postop Assessment: no signs of nausea or vomiting Anesthetic complications: no       Last Vitals:  Vitals:   05/26/16 1645 05/26/16 1656  BP: 125/67   Pulse: (!) 57 (!) 56  Resp: 13 14  Temp:  36.8 C    Last Pain:  Vitals:   05/26/16 1625  TempSrc:   PainSc: Asleep                 Apolo Cutshaw,E. Becker Christopher

## 2016-05-26 NOTE — Op Note (Signed)
Preoperative diagnosis:  1. Bilateral ureteral obstruction 2. Metastatic prostate cancer   Postoperative diagnosis:  1. Bilateral ureteral obstruction 2. Metastatic prostate cancer   Procedure:  1. Cystoscopy 2. Bilateral ureteral stent placement (6 x 24 Bard Inlay Optima)  Surgeon: Roxy Horseman, Brooke Bonito. M.D.  Anesthesia: General  Complications: None  Intraoperative findings: His stents were mildly encrusted bilaterally.  EBL: Minimal  Specimens: None  Indication: [name] is a 83 y.o. patient with bilateral ureteral obstruction. After reviewing the management options for treatment, he elected to proceed with the above surgical procedure(s). We have discussed the potential benefits and risks of the procedure, side effects of the proposed treatment, the likelihood of the patient achieving the goals of the procedure, and any potential problems that might occur during the procedure or recuperation. Informed consent has been obtained.  Description of procedure:  The patient was taken to the operating room and general anesthesia was induced.  The patient was placed in the dorsal lithotomy position, prepped and draped in the usual sterile fashion, and preoperative antibiotics were administered. A preoperative time-out was performed.   Cystourethroscopy was performed.  The patient's urethra was examined and was unremarkable. The bladder was then systematically examined in its entirety. There was no evidence for any bladder tumors, stones, or other mucosal pathology.    Attention then turned to the right ureteral orifice and the patient's indwelling ureteral stent was identified and brought out to the urethral meatus with the flexible graspers.  A 0.38 sensor guidewire was then advanced up the right ureter into the renal pelvis under fluoroscopic guidance.  The wire was then backloaded through the cystoscope and a ureteral stent was advance over the wire using Seldinger technique.   The stent was positioned appropriately under fluoroscopic and cystoscopic guidance.  The wire was then removed with an adequate stent curl noted in the renal pelvis as well as in the bladder.  Attention then turned to the left ureteral orifice and the patient's indwelling ureteral stent was identified and brought out to the urethral meatus with the flexible graspers.  A 0.38 sensor guidewire was then advanced up the left ureter into the renal pelvis under fluoroscopic guidance.  The wire was then backloaded through the cystoscope and a ureteral stent was advance over the wire using Seldinger technique.  The stent was positioned appropriately under fluoroscopic and cystoscopic guidance.  The wire was then removed with an adequate stent curl noted in the renal pelvis as well as in the bladder.  The bladder was then emptied and the procedure ended.  The patient appeared to tolerate the procedure well and without complications.  The patient was able to be awakened and transferred to the recovery unit in satisfactory condition.    Pryor Curia MD

## 2016-05-26 NOTE — Anesthesia Preprocedure Evaluation (Addendum)
Anesthesia Evaluation  Patient identified by MRN, date of birth, ID band Patient awake    Reviewed: Allergy & Precautions, NPO status , Patient's Chart, lab work & pertinent test results, reviewed documented beta blocker date and time   Airway Mallampati: I  TM Distance: >3 FB Neck ROM: Full    Dental  (+) Edentulous Upper, Dental Advisory Given   Pulmonary sleep apnea (no longer requires CPAP) , former smoker,    breath sounds clear to auscultation       Cardiovascular hypertension, Pt. on medications (-) CAD + dysrhythmias Atrial Fibrillation  Rhythm:Irregular Rate:Normal  '16 Echo: EF 55-60%, valves OK   Neuro/Psych negative neurological ROS     GI/Hepatic GERD  Controlled,  Endo/Other  diabetes (glu 118), Oral Hypoglycemic Agents, Insulin Dependent  Renal/GU Renal InsufficiencyRenal disease (creat 1.3)     Musculoskeletal  (+) Arthritis ,   Abdominal   Peds  Hematology  (+) Blood dyscrasia (Hb10.7), , lymphoma   Anesthesia Other Findings   Reproductive/Obstetrics                            Anesthesia Physical Anesthesia Plan  ASA: III  Anesthesia Plan: General   Post-op Pain Management:    Induction: Intravenous  Airway Management Planned: LMA  Additional Equipment:   Intra-op Plan:   Post-operative Plan:   Informed Consent: I have reviewed the patients History and Physical, chart, labs and discussed the procedure including the risks, benefits and alternatives for the proposed anesthesia with the patient or authorized representative who has indicated his/her understanding and acceptance.   Dental advisory given  Plan Discussed with: CRNA and Surgeon  Anesthesia Plan Comments: (Plan routine monitors, GA- LMA OK)        Anesthesia Quick Evaluation

## 2016-05-26 NOTE — Transfer of Care (Signed)
Immediate Anesthesia Transfer of Care Note  Patient: [name]  Procedure(s) Performed: Procedure(s): CYSTOSCOPY WITH STENT REPLACEMENT (Bilateral)  Patient Location: PACU  Anesthesia Type:General  Level of Consciousness:  sedated, patient cooperative and responds to stimulation  Airway & Oxygen Therapy:Patient Spontanous Breathing and Patient connected to face mask oxgen  Post-op Assessment:  Report given to PACU RN and Post -op Vital signs reviewed and stable  Post vital signs:  Reviewed and stable  Last Vitals:  Vitals:   05/26/16 1304 05/26/16 1625  BP: 123/66 (P) 131/64  Pulse: 62 (!) 58  Resp: 16 13  Temp: 36.6 C 39.7 C    Complications: No apparent anesthesia complications

## 2016-05-26 NOTE — Anesthesia Procedure Notes (Signed)
Procedure Name: LMA Insertion Date/Time: 05/26/2016 3:55 PM Performed by: West Pugh Pre-anesthesia Checklist: Patient identified, Emergency Drugs available, Suction available, Patient being monitored and Timeout performed Patient Re-evaluated:Patient Re-evaluated prior to inductionOxygen Delivery Method: Circle system utilized Preoxygenation: Pre-oxygenation with 100% oxygen Intubation Type: IV induction LMA: LMA with gastric port inserted LMA Size: 5.0 Number of attempts: 1 Placement Confirmation: positive ETCO2 and breath sounds checked- equal and bilateral Tube secured with: Tape Dental Injury: Teeth and Oropharynx as per pre-operative assessment

## 2016-05-26 NOTE — Interval H&P Note (Signed)
History and Physical Interval Note:  05/26/2016 3:39 PM  [name]  has presented today for surgery, with the diagnosis of BILATERAL URETERAL OBSTRUCTION  The various methods of treatment have been discussed with the patient and family. After consideration of risks, benefits and other options for treatment, the patient has consented to  Procedure(s): CYSTOSCOPY WITH STENT REPLACEMENT (Bilateral) as a surgical intervention .  The patient's history has been reviewed, patient examined, no change in status, stable for surgery.  I have reviewed the patient's chart and labs.  Questions were answered to the patient's satisfaction.     Dewain Platz,LES

## 2016-05-26 NOTE — Telephone Encounter (Signed)
[name] called asking if Dr Jana Hakim wants pt to take antibiotic 5 days prior to dental procedure or a double dose the morning of the procedure. This statement is different than the rx for amoxicillin dated 01/01/16.  She states this is her 2nd call.  The dental appt is Monday morning.

## 2016-05-27 ENCOUNTER — Encounter (HOSPITAL_COMMUNITY): Payer: Self-pay | Admitting: Urology

## 2016-05-27 LAB — HEMOGLOBIN A1C
Hgb A1c MFr Bld: 6.7 % — ABNORMAL HIGH (ref 4.8–5.6)
Mean Plasma Glucose: 146 mg/dL

## 2016-05-27 LAB — GLUCOSE, CAPILLARY: Glucose-Capillary: 120 mg/dL — ABNORMAL HIGH (ref 65–99)

## 2016-06-02 DIAGNOSIS — E119 Type 2 diabetes mellitus without complications: Secondary | ICD-10-CM | POA: Diagnosis not present

## 2016-06-06 DIAGNOSIS — E119 Type 2 diabetes mellitus without complications: Secondary | ICD-10-CM | POA: Diagnosis not present

## 2016-06-06 DIAGNOSIS — H40023 Open angle with borderline findings, high risk, bilateral: Secondary | ICD-10-CM | POA: Diagnosis not present

## 2016-06-06 DIAGNOSIS — H5213 Myopia, bilateral: Secondary | ICD-10-CM | POA: Diagnosis not present

## 2016-06-06 DIAGNOSIS — H01022 Squamous blepharitis right lower eyelid: Secondary | ICD-10-CM | POA: Diagnosis not present

## 2016-06-06 DIAGNOSIS — H01025 Squamous blepharitis left lower eyelid: Secondary | ICD-10-CM | POA: Diagnosis not present

## 2016-06-26 ENCOUNTER — Other Ambulatory Visit: Payer: Self-pay | Admitting: Oncology

## 2016-07-11 ENCOUNTER — Other Ambulatory Visit: Payer: Self-pay | Admitting: *Deleted

## 2016-07-12 ENCOUNTER — Ambulatory Visit (HOSPITAL_BASED_OUTPATIENT_CLINIC_OR_DEPARTMENT_OTHER): Payer: Medicare Other | Admitting: Oncology

## 2016-07-12 ENCOUNTER — Ambulatory Visit: Payer: Medicare Other

## 2016-07-12 ENCOUNTER — Ambulatory Visit (HOSPITAL_BASED_OUTPATIENT_CLINIC_OR_DEPARTMENT_OTHER): Payer: Medicare Other

## 2016-07-12 ENCOUNTER — Other Ambulatory Visit (HOSPITAL_BASED_OUTPATIENT_CLINIC_OR_DEPARTMENT_OTHER): Payer: Medicare Other

## 2016-07-12 VITALS — BP 111/51 | HR 66 | Temp 97.7°F | Resp 16

## 2016-07-12 VITALS — BP 106/53 | HR 68 | Temp 98.1°F | Resp 18 | Ht 67.0 in | Wt 182.3 lb

## 2016-07-12 DIAGNOSIS — C61 Malignant neoplasm of prostate: Secondary | ICD-10-CM | POA: Diagnosis not present

## 2016-07-12 DIAGNOSIS — I4891 Unspecified atrial fibrillation: Secondary | ICD-10-CM | POA: Diagnosis not present

## 2016-07-12 DIAGNOSIS — C8580 Other specified types of non-Hodgkin lymphoma, unspecified site: Secondary | ICD-10-CM

## 2016-07-12 DIAGNOSIS — C8593 Non-Hodgkin lymphoma, unspecified, intra-abdominal lymph nodes: Secondary | ICD-10-CM

## 2016-07-12 DIAGNOSIS — C8590 Non-Hodgkin lymphoma, unspecified, unspecified site: Secondary | ICD-10-CM

## 2016-07-12 DIAGNOSIS — C8219 Follicular lymphoma grade II, extranodal and solid organ sites: Secondary | ICD-10-CM

## 2016-07-12 DIAGNOSIS — C8203 Follicular lymphoma grade I, intra-abdominal lymph nodes: Secondary | ICD-10-CM

## 2016-07-12 DIAGNOSIS — Z5112 Encounter for antineoplastic immunotherapy: Secondary | ICD-10-CM

## 2016-07-12 DIAGNOSIS — Z95828 Presence of other vascular implants and grafts: Secondary | ICD-10-CM

## 2016-07-12 LAB — CBC WITH DIFFERENTIAL/PLATELET
BASO%: 0.2 % (ref 0.0–2.0)
BASOS ABS: 0 10*3/uL (ref 0.0–0.1)
EOS ABS: 0.2 10*3/uL (ref 0.0–0.5)
EOS%: 3.7 % (ref 0.0–7.0)
HCT: 32.1 % — ABNORMAL LOW (ref 38.4–49.9)
HGB: 10.8 g/dL — ABNORMAL LOW (ref 13.0–17.1)
LYMPH#: 0.3 10*3/uL — AB (ref 0.9–3.3)
LYMPH%: 6.4 % — ABNORMAL LOW (ref 14.0–49.0)
MCH: 28.3 pg (ref 27.2–33.4)
MCHC: 33.6 g/dL (ref 32.0–36.0)
MCV: 84 fL (ref 79.3–98.0)
MONO#: 0.3 10*3/uL (ref 0.1–0.9)
MONO%: 6.4 % (ref 0.0–14.0)
NEUT#: 3.8 10*3/uL (ref 1.5–6.5)
NEUT%: 83.3 % — AB (ref 39.0–75.0)
Platelets: 119 10*3/uL — ABNORMAL LOW (ref 140–400)
RBC: 3.82 10*6/uL — ABNORMAL LOW (ref 4.20–5.82)
RDW: 15.1 % — ABNORMAL HIGH (ref 11.0–14.6)
WBC: 4.6 10*3/uL (ref 4.0–10.3)

## 2016-07-12 LAB — COMPREHENSIVE METABOLIC PANEL
ALT: 27 U/L (ref 0–55)
ANION GAP: 11 meq/L (ref 3–11)
AST: 28 U/L (ref 5–34)
Albumin: 3.9 g/dL (ref 3.5–5.0)
Alkaline Phosphatase: 60 U/L (ref 40–150)
BUN: 24.8 mg/dL (ref 7.0–26.0)
CALCIUM: 9.8 mg/dL (ref 8.4–10.4)
CHLORIDE: 106 meq/L (ref 98–109)
CO2: 22 mEq/L (ref 22–29)
Creatinine: 1.5 mg/dL — ABNORMAL HIGH (ref 0.7–1.3)
EGFR: 44 mL/min/{1.73_m2} — AB (ref >90)
Glucose: 183 mg/dl — ABNORMAL HIGH (ref 70–140)
POTASSIUM: 4.3 meq/L (ref 3.5–5.1)
Sodium: 139 mEq/L (ref 136–145)
Total Bilirubin: 0.34 mg/dL (ref 0.20–1.20)
Total Protein: 6.6 g/dL (ref 6.4–8.3)

## 2016-07-12 LAB — LACTATE DEHYDROGENASE: LDH: 131 U/L (ref 125–245)

## 2016-07-12 MED ORDER — SODIUM CHLORIDE 0.9 % IJ SOLN
10.0000 mL | INTRAMUSCULAR | Status: DC | PRN
  Administered 2016-07-12: 10 mL via INTRAVENOUS
  Filled 2016-07-12: qty 10

## 2016-07-12 MED ORDER — SODIUM CHLORIDE 0.9 % IV SOLN
350.0000 mg/m2 | Freq: Once | INTRAVENOUS | Status: AC
  Administered 2016-07-12: 700 mg via INTRAVENOUS
  Filled 2016-07-12: qty 50

## 2016-07-12 MED ORDER — DIPHENHYDRAMINE HCL 25 MG PO CAPS
ORAL_CAPSULE | ORAL | Status: AC
  Filled 2016-07-12: qty 1

## 2016-07-12 MED ORDER — DIPHENHYDRAMINE HCL 25 MG PO CAPS
25.0000 mg | ORAL_CAPSULE | Freq: Once | ORAL | Status: AC
  Administered 2016-07-12: 25 mg via ORAL

## 2016-07-12 MED ORDER — SODIUM CHLORIDE 0.9 % IV SOLN
Freq: Once | INTRAVENOUS | Status: AC
  Administered 2016-07-12: 10:00:00 via INTRAVENOUS

## 2016-07-12 MED ORDER — ACETAMINOPHEN 325 MG PO TABS
650.0000 mg | ORAL_TABLET | Freq: Once | ORAL | Status: AC
  Administered 2016-07-12: 650 mg via ORAL

## 2016-07-12 MED ORDER — ACETAMINOPHEN 325 MG PO TABS
ORAL_TABLET | ORAL | Status: AC
  Filled 2016-07-12: qty 2

## 2016-07-12 MED ORDER — SODIUM CHLORIDE 0.9 % IJ SOLN
10.0000 mL | INTRAMUSCULAR | Status: DC | PRN
Start: 2016-07-12 — End: 2016-07-12
  Administered 2016-07-12: 10 mL
  Filled 2016-07-12: qty 10

## 2016-07-12 MED ORDER — HEPARIN SOD (PORK) LOCK FLUSH 100 UNIT/ML IV SOLN
500.0000 [IU] | Freq: Once | INTRAVENOUS | Status: AC | PRN
  Administered 2016-07-12: 500 [IU]
  Filled 2016-07-12: qty 5

## 2016-07-12 NOTE — Progress Notes (Signed)
ID: [name] OB: Jun 18, 1931  MR#: [id number]  BZM#:[id number]  PCP: Horatio Pel, MD GYN:   SU: [name] OTHER MD: [name], [name],  [name], [name], [name]  CHIEF COMPLAINT: Non-Hodgkin's lymphoma, stage IV prostate cancer  CURRENT TREATMENT: Maintenance rituximab  NON-HODGKIN"S LYMPHOMA HISTORY: From the prior summary:  The patient developed right upper quadrant pain last year and he had an ultrasound April 5th which showed some gallstones without evidence of cholecystitis or ductal dilatation.  However, there were multiple lesions in the liver which could not be assessed further.  Accordingly on July 31, 2003, [name], showed multiple liver masses, more than 25, most over 1 cm, the largest being in the inferior right lobe, measuring 4.2 cm. There was also a small mass in the spleen.  CT of the pelvis was unremarkable.   The patient had a biopsy of the liver August 01, 2003.  The report says only that it was "a lesion in the right lobe of the liver".  Presumably this was the largest lesion present.  The final pathology [id number] and [phone]) showed only cirrhosis.     The patient has been followed by [name], and a repeat CT scan of the abdomen and pelvis was obtained May 18, 2004.  Many of the liver lesions seen previously had actually decreased in size.  However, [name], [name], a second ultrasound-guided biopsy was performed 06/11/04. This was a lesion deep in the left lobe of the liver and therefore, I would think not the same one previously biopsied which  was in the right liver. The pathology this time [id number]) shows a poorly differentiated neuroendocrine carcinoma which was positive for chromogranin A, negative for synaptophysin, thyroid transcription factor, a variety of cytokeratins, [name], alpha-fetoprotein and COX-2."  [name] was subsequently evaluated at [facility] by Dr. Otelia Limes and repeat liver biopsy and review of the earlier biopsy here showed a primary hepatic lymphoma. The patient was treated with R-CHOP as detailed below and achieved a complete response. He received maintenance rituximab until 2008  His subsequent history is as detailed below  INTERVAL HISTORY. [name] returns today for follow-up of his primary hepatic large cell non-Hodgkin's lymphoma accompanied by one of his daughters. He continues on maintenance rituximab, which she receives every 8 weeks. His port is working well. He has no side effects from the treatment that he is aware of.  He denies any fevers, drenching sweats, unexplained weight loss, unexplained fatigue, rash, bleeding, or adenopathy  REVIEW OF SYSTEMS:  [name] is also tolerating his prostate hormonal treatments well, with no hot flashes and as far as he is aware no osteoporosis issues. He is eager for the weather to improved so he can get back on his garden. A detailed review of systems today was otherwise stable.  PAST MEDICAL HISTORY: Past Medical History:  Diagnosis Date  . A-fib ([place])   . Anemia   . Arthritis   . Atrial fibrillation ([place])   . Cancer of liver ([place])   . Diabetes mellitus    INSULIN  DEPENDENT  . GERD (gastroesophageal reflux disease)   . HOH (hard of hearing)   . HX, PERSONAL, MALIGNANCY, PROSTATE 07/28/2006   Annotation: 2001, resected Qualifier: Diagnosis of  By: [name], [name]    . Hypertension   . Kidney stone   . Lymphoma ([place])   . Memory deficit 10/18/2013  . Near syncope 10/18/2013  . Neuropathy in diabetes ([place])    Hx: of  . Prostate cancer ([place])   . Shortness of  breath    with exertion   . Skin cancer   . Sleep apnea    on CPAP - has not used in a long time   Significant for prostate cancer, the patient undergoing prostatectomy January 2001 under [facility] for a Gleason 7, pathologic T3b (positive seminal vesicle involvement) adenocarcinoma with 0 of 2 lymph nodes involved.  Current PSA is followed by Dr. [name]. Other medical problems include sleep apnea, minor coronary artery disease, hypertension, hypertriglyceridemia, cholelithiasis, colon polyps, cirrhosis by biopsy, diabetes, [name], history of nephrolithiasis, status post right renal surgery and history of left rotator cuff repair under [name].    PAST SURGICAL HISTORY: Past Surgical History:  Procedure Laterality Date  . ANKLE SURGERY    . CARDIAC CATHETERIZATION  09/16/96   Normal LV systolic function,dense ca+ prox. portion of the LAD w/50% narrowing in the distal portion, 30-40% irreg. in the proximal portion & 80% narrowing in the ostial portion of the posterolateral branch.  . CHOLECYSTECTOMY  04/04/2013  . CHOLECYSTECTOMY N/A 04/04/2013   Procedure: LAPAROSCOPIC CHOLECYSTECTOMY WITH INTRAOPERATIVE CHOLANGIOGRAM;  [name], MD;  Location: [place];  Service: General;  [name];  . COLONOSCOPY     Hx: of  . CYSTOSCOPY W/ URETERAL STENT PLACEMENT Bilateral 06/01/2015   Procedure: CYSTOSCOPY WITH BILATERAL STENT REPLACEMENT;  Surgeon: Raynelle Bring, MD;  Location: [facility];  Service: Urology;  Laterality: Bilateral;  . CYSTOSCOPY W/ URETERAL STENT PLACEMENT Bilateral 10/29/2015   Procedure: CYSTOSCOPY WITH BILATERAL STENT REPLACEMENT;  Surgeon: Raynelle Bring, MD;  Location: [facility];  Service: Urology;  Laterality: Bilateral;  . CYSTOSCOPY W/ URETERAL STENT PLACEMENT Bilateral 05/26/2016   Procedure: CYSTO URETEROSCOPY  WITH BILATERAL  STENT REPLACEMENT;  Surgeon: Raynelle Bring, MD;  Location: [facility];  Service:  Urology;  Laterality: Bilateral;  . CYSTOSCOPY WITH STENT PLACEMENT Bilateral 02/05/2015   Procedure: CYSTOSCOPY RETROGRADE AND BILATERAL  STENT PLACEMENT;  Surgeon: Kathie Rhodes, MD;  Location: [facility];  Service: Urology;  Laterality: Bilateral;  . INFUSION PORT  04/04/2013   RIGHT SUBCLAVIAN  . KIDNEY STONE SURGERY    . MOUTH SURGERY  10/19/2015   left upper teeth removed along with palate abscess   . PORTACATH PLACEMENT N/A 04/04/2013   Procedure: INSERTION PORT-A-CATH;  [name], MD;  Location: [place];  Service: General;  [name];  . PROSTATECTOMY    . ROTATOR CUFF REPAIR    . TRANSURETHRAL RESECTION OF BLADDER TUMOR WITH GYRUS (TURBT-GYRUS) N/A 02/05/2015   Procedure: TRANSURETHRAL RESECTION OF BLADDER TUMOR  ;  Surgeon: Kathie Rhodes, MD;  Location: [facility];  Service: Urology;  [name];    FAMILY HISTORY Family History  Problem Relation Age of Onset  . Heart disease Father   . Heart attack Father   . Cancer Sister   . Heart attack Brother   . Heart attack Brother   . Heart attack Brother   . Heart attack Brother   .  Heart attack Brother   . Cancer Brother   . Cancer Brother   The patient's father died at the age of 37 from an MI.  The patient's mother died from "old age" at 13.  The patient is one of nine siblings.  One brother died from cancer of the esophagus, 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, [name], a homemaker, died in 06-Jun-2012: [name] is an Scientist, physiological for [employer]; [name], [name]; and [name] is Environmental consultant.  Everybody lives in [place].  The patient has five grandchildren.  He is a member of [place]. [employer].    ADVANCED DIRECTIVES: in place   HEALTH MAINTENANCE: Social History  Substance Use Topics  . Smoking status: Former Smoker    Types: Pipe, Landscape architect  . Smokeless tobacco: Former Systems developer    Types: Chew     Comment: [place] YEARS AGO "  . Alcohol use No     Colonoscopy:  PSA: Followed by Dr. [name]  Bone density:  Lipid panel:  Allergies  Allergen Reactions  . Atenolol Other (See Comments)    Slowed heart rate.   . Niacin Other (See Comments)    headaches    Current Outpatient Prescriptions  Medication Sig Dispense Refill  . allopurinol (ZYLOPRIM) 300 MG tablet TAKE 1 TABLET EVERY DAY 90 tablet 3  . amLODipine (NORVASC) 5 MG tablet Take 2.5 mg by mouth daily.     Marland Kitchen atorvastatin (LIPITOR) 20 MG tablet Take 20 mg by mouth at bedtime.    . Calcium-Magnesium-Vitamin D (CALCIUM 1200+D3 PO) Take 2 tablets by mouth daily with lunch.     . cetirizine (ZYRTEC) 10 MG tablet Take 10 mg by mouth daily.    . cholestyramine (QUESTRAN) 4 g packet Take 4 g by mouth 3 (three) times daily as needed (for diarrhea).     . Cyanocobalamin (VITAMIN B-12) 2500 MCG SUBL Place 2,500 mcg under the tongue daily.    . fenofibrate 160 MG tablet Take 160 mg by mouth at bedtime.    . gabapentin (NEURONTIN) 300 MG capsule Take 300 mg by mouth 3 (three) times daily.     Marland Kitchen lidocaine-prilocaine (EMLA) cream Apply 1 application topically as needed. (Patient taking differently: Apply 1 application topically daily as needed (port numbing). ) 30 g 3  . magnesium oxide (MAG-OX) 400 (241.3 MG) MG tablet Take 400 tablets by mouth 2 (two) times daily.   4  . metFORMIN (GLUCOPHAGE) 1000 MG tablet Take 1,000 mg by mouth 2 (two) times daily with a meal.     . Multiple Vitamins-Minerals (MULTIVITAMIN WITH MINERALS) tablet Take 1 tablet by mouth daily.    Marland Kitchen oxyCODONE-acetaminophen (PERCOCET) 5-325 MG tablet Take 1-2 tablets by mouth every 6 (six) hours as needed (for pain; may cause constipation).  20 tablet 0  . Rivaroxaban (XARELTO) 15 MG TABS tablet Take 15 mg by mouth daily with lunch.     . sertraline (ZOLOFT) 100 MG tablet Take 100 mg by mouth daily.    Nelva Nay SOLOSTAR 300 UNIT/ML SOPN INJECT 60 UNITS SUB-Q ONCE DAILY  5  . vitamin C (ASCORBIC ACID) 500 MG tablet Take  500 mg by mouth daily.     No current facility-administered medications for this visit.     Objective: Older white man Who appears stated age 35:   07/12/16 0938  BP: (!) 106/53  Pulse: 68  Resp: 18  Temp: 98.1 F (36.7 C)     Body mass index is 28.55 kg/m.      ECOG FS:1 - Symptomatic but completely ambulatory  Sclerae unicteric, pupils round and equal Oropharynx clear and moist-- no thrush or other lesions No cervical or supraclavicular adenopathy, no axillary or inguinal adenopathy Lungs no rales or rhonchi Heart regular rate and rhythm Abd soft, nontender, positive bowel sounds MSK no focal spinal tenderness, no upper extremity lymphedema Neuro: nonfocal, well oriented, appropriate affect    LAB RESULTS:  CMP     Component Value Date/Time   NA 137 05/26/2016 1347   NA 139 05/17/2016 0846   K 4.3 05/26/2016 1347   K 4.1 05/17/2016 0846   CL 105 05/26/2016 1347   CO2 24 05/26/2016 1347   CO2 21 (L) 05/17/2016 0846   GLUCOSE 118 (H) 05/26/2016 1347   GLUCOSE 179 (H) 05/17/2016 0846   BUN 22 (H) 05/26/2016 1347   BUN 24.7 05/17/2016 0846   CREATININE 1.31 (H) 05/26/2016 1347   CREATININE 1.5 (H) 05/17/2016 0846   CALCIUM 9.4 05/26/2016 1347   CALCIUM 9.7 05/17/2016 0846   PROT 6.5 05/17/2016 0846   ALBUMIN 4.0 05/17/2016 0846   AST 24 05/17/2016 0846   ALT 23 05/17/2016 0846   ALKPHOS 60 05/17/2016 0846   BILITOT 0.31 05/17/2016 0846   GFRNONAA 49 (L) 05/26/2016 1347   GFRAA 57 (L) 05/26/2016 1347    No results found for: SPEP  Lab Results  Component Value Date   WBC 4.6 07/12/2016   NEUTROABS 3.8 07/12/2016   HGB 10.8 (L) 07/12/2016   HCT 32.1 (L) 07/12/2016   MCV  84.0 07/12/2016   PLT 119 (L) 07/12/2016      Chemistry      Component Value Date/Time   NA 137 05/26/2016 1347   NA 139 05/17/2016 0846   K 4.3 05/26/2016 1347   K 4.1 05/17/2016 0846   CL 105 05/26/2016 1347   CO2 24 05/26/2016 1347   CO2 21 (L) 05/17/2016 0846   BUN 22 (H) 05/26/2016 1347   BUN 24.7 05/17/2016 0846   CREATININE 1.31 (H) 05/26/2016 1347   CREATININE 1.5 (H) 05/17/2016 0846      Component Value Date/Time   CALCIUM 9.4 05/26/2016 1347   CALCIUM 9.7 05/17/2016 0846   ALKPHOS 60 05/17/2016 0846   AST 24 05/17/2016 0846   ALT 23 05/17/2016 0846   BILITOT 0.31 05/17/2016 0846      Urinalysis    Component Value Date/Time   COLORURINE YELLOW 02/04/2015 1649   APPEARANCEUR TURBID (A) 02/04/2015 1649   LABSPEC 1.020 04/24/2015 0846   PHURINE 6.0 04/24/2015 0846   PHURINE 5.5 02/04/2015 1649   GLUCOSEU 250 04/24/2015 0846   HGBUR Large 04/24/2015 0846   HGBUR LARGE (A) 02/04/2015 1649   BILIRUBINUR Negative 04/24/2015 0846   KETONESUR Negative 04/24/2015 0846   [place] 02/04/2015 1649   PROTEINUR 30 04/24/2015 0846   PROTEINUR 100 (A) 02/04/2015 1649   UROBILINOGEN 0.2 04/24/2015 0846   NITRITE Negative 04/24/2015 0846   NITRITE POSITIVE (A) 02/04/2015 1649   LEUKOCYTESUR Negative 04/24/2015 0846    STUDIES: PET scan January 2018 showed no evidence of lymphoma activity  ASSESSMENT:  83 y.o. patient with a diagnosis of    #1 Primary hepatic non-Hodgkin's lymphoma established through liver biopsy February 2006, treated with Rituxan x9 and then CHOP x6.  All chemotherapy completed in 2007.  Maintenance Rituxan discontinued October 2008.     #2  Biopsy proven retroperitoneal recurrence documented November 2014. Bone marrow biopsy 04/01/2013 was negative   #3 status post  laparoscopic cholecystectomy and port placement on 04/04/2013.  #4 Cycle #1  RICE chemotherapy  completed on 05/02/2013.   #5 cycle #2 RICE chemotherapy  on  05/17/2013  #6 PET scan performed on 05/23/2013 revealed marked partial response to chemotherapy with a retroperitoneal mass decrease in metabolic activity from SUV of 13.2 to 6.3. Right adrenal mass size and metabolic activity is resolved when compared to the previous PET scan.   #7 Evaluated at [facility] by Dr. Tomasa Hosteller for autologous transplant on 05/27/2013 and was quoted a 3% chance of significant heart damage and possibly death from the treatment and a 50% chance of being alive 5 years from now after transplant (versus perhaps 2 years without). After much thought the patient and family have decided firmly they do not wish to proceed to stem cell transplant.  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 This is very favorable.  He tolerates the rituximab with no side effects that he or I are aware of. The plan will be to continue maintenance, every 8 weeks, indefinitely, until there is evidence of disease progression  His prostate cancer is being managed through urology, with hormone treatments every 6 months now. His PSA remains well controlled.  We reviewed the results of his January PET scan. At this point I feel comfortable seeing him on an every six-month basis. We will obtain a PET scan again sometime this year,  likely towards the end of the year, or earlier if any symptoms develop  He knows to call for any problems that may come out before his next visit here.   [name], MD   07/12/2016

## 2016-07-12 NOTE — Patient Instructions (Signed)
[facility] Discharge Instructions for Patients Receiving Chemotherapy  Today you received the following chemotherapy agents Rituxan To help prevent nausea and vomiting after your treatment, we encourage you to take your nausea medication as prescribed.  If you develop nausea and vomiting that is not controlled by your nausea medication, call the clinic.   BELOW ARE SYMPTOMS THAT SHOULD BE REPORTED IMMEDIATELY:  *FEVER GREATER THAN 100.5 F  *CHILLS WITH OR WITHOUT FEVER  NAUSEA AND VOMITING THAT IS NOT CONTROLLED WITH YOUR NAUSEA MEDICATION  *UNUSUAL [name]  *UNUSUAL BRUISING OR BLEEDING  TENDERNESS IN MOUTH AND THROAT WITH OR WITHOUT PRESENCE OF ULCERS  *URINARY PROBLEMS  *BOWEL PROBLEMS  UNUSUAL RASH Items with * indicate a potential emergency and should be followed up as soon as possible.  Feel free to call the clinic you have any questions or concerns. The clinic phone number is [phone].  Please show the CHEMO ALERT CARD at check-in to the Emergency Department and triage nurse.   

## 2016-07-13 DIAGNOSIS — H40023 Open angle with borderline findings, high risk, bilateral: Secondary | ICD-10-CM | POA: Diagnosis not present

## 2016-07-13 DIAGNOSIS — H01022 Squamous blepharitis right lower eyelid: Secondary | ICD-10-CM | POA: Diagnosis not present

## 2016-07-13 DIAGNOSIS — E119 Type 2 diabetes mellitus without complications: Secondary | ICD-10-CM | POA: Diagnosis not present

## 2016-07-13 DIAGNOSIS — H01025 Squamous blepharitis left lower eyelid: Secondary | ICD-10-CM | POA: Diagnosis not present

## 2016-08-04 DIAGNOSIS — Z85828 Personal history of other malignant neoplasm of skin: Secondary | ICD-10-CM | POA: Diagnosis not present

## 2016-08-04 DIAGNOSIS — D485 Neoplasm of uncertain behavior of skin: Secondary | ICD-10-CM | POA: Diagnosis not present

## 2016-08-04 DIAGNOSIS — C44319 Basal cell carcinoma of skin of other parts of face: Secondary | ICD-10-CM | POA: Diagnosis not present

## 2016-08-04 DIAGNOSIS — L910 Hypertrophic scar: Secondary | ICD-10-CM | POA: Diagnosis not present

## 2016-08-04 DIAGNOSIS — L570 Actinic keratosis: Secondary | ICD-10-CM | POA: Diagnosis not present

## 2016-08-04 DIAGNOSIS — C44222 Squamous cell carcinoma of skin of right ear and external auricular canal: Secondary | ICD-10-CM | POA: Diagnosis not present

## 2016-08-04 DIAGNOSIS — L821 Other seborrheic keratosis: Secondary | ICD-10-CM | POA: Diagnosis not present

## 2016-09-09 ENCOUNTER — Other Ambulatory Visit (HOSPITAL_BASED_OUTPATIENT_CLINIC_OR_DEPARTMENT_OTHER): Payer: Medicare Other

## 2016-09-09 ENCOUNTER — Ambulatory Visit: Payer: Medicare Other

## 2016-09-09 ENCOUNTER — Ambulatory Visit (HOSPITAL_BASED_OUTPATIENT_CLINIC_OR_DEPARTMENT_OTHER): Payer: Medicare Other

## 2016-09-09 VITALS — BP 168/65 | HR 68 | Temp 98.7°F | Resp 18

## 2016-09-09 DIAGNOSIS — C8580 Other specified types of non-Hodgkin lymphoma, unspecified site: Secondary | ICD-10-CM

## 2016-09-09 DIAGNOSIS — C8203 Follicular lymphoma grade I, intra-abdominal lymph nodes: Secondary | ICD-10-CM

## 2016-09-09 DIAGNOSIS — C8593 Non-Hodgkin lymphoma, unspecified, intra-abdominal lymph nodes: Secondary | ICD-10-CM | POA: Diagnosis not present

## 2016-09-09 DIAGNOSIS — Z95828 Presence of other vascular implants and grafts: Secondary | ICD-10-CM

## 2016-09-09 DIAGNOSIS — Z5112 Encounter for antineoplastic immunotherapy: Secondary | ICD-10-CM

## 2016-09-09 DIAGNOSIS — C61 Malignant neoplasm of prostate: Secondary | ICD-10-CM

## 2016-09-09 DIAGNOSIS — C8590 Non-Hodgkin lymphoma, unspecified, unspecified site: Secondary | ICD-10-CM

## 2016-09-09 DIAGNOSIS — C8219 Follicular lymphoma grade II, extranodal and solid organ sites: Secondary | ICD-10-CM

## 2016-09-09 LAB — CBC WITH DIFFERENTIAL/PLATELET
BASO%: 0.3 % (ref 0.0–2.0)
BASOS ABS: 0 10*3/uL (ref 0.0–0.1)
EOS ABS: 0.1 10*3/uL (ref 0.0–0.5)
EOS%: 4 % (ref 0.0–7.0)
HEMATOCRIT: 30.1 % — AB (ref 38.4–49.9)
HEMOGLOBIN: 9.9 g/dL — AB (ref 13.0–17.1)
LYMPH#: 0.2 10*3/uL — AB (ref 0.9–3.3)
LYMPH%: 7.3 % — ABNORMAL LOW (ref 14.0–49.0)
MCH: 28 pg (ref 27.2–33.4)
MCHC: 32.9 g/dL (ref 32.0–36.0)
MCV: 85.3 fL (ref 79.3–98.0)
MONO#: 0.3 10*3/uL (ref 0.1–0.9)
MONO%: 8.9 % (ref 0.0–14.0)
NEUT#: 2.4 10*3/uL (ref 1.5–6.5)
NEUT%: 79.5 % — AB (ref 39.0–75.0)
Platelets: 98 10*3/uL — ABNORMAL LOW (ref 140–400)
RBC: 3.53 10*6/uL — ABNORMAL LOW (ref 4.20–5.82)
RDW: 15.5 % — ABNORMAL HIGH (ref 11.0–14.6)
WBC: 3 10*3/uL — ABNORMAL LOW (ref 4.0–10.3)

## 2016-09-09 LAB — COMPREHENSIVE METABOLIC PANEL
ALBUMIN: 3.8 g/dL (ref 3.5–5.0)
ALK PHOS: 55 U/L (ref 40–150)
ALT: 37 U/L (ref 0–55)
AST: 42 U/L — AB (ref 5–34)
Anion Gap: 11 mEq/L (ref 3–11)
BUN: 22.4 mg/dL (ref 7.0–26.0)
CO2: 22 mEq/L (ref 22–29)
CREATININE: 1.4 mg/dL — AB (ref 0.7–1.3)
Calcium: 8.9 mg/dL (ref 8.4–10.4)
Chloride: 110 mEq/L — ABNORMAL HIGH (ref 98–109)
EGFR: 47 mL/min/{1.73_m2} — ABNORMAL LOW (ref >90)
GLUCOSE: 92 mg/dL (ref 70–140)
POTASSIUM: 3.8 meq/L (ref 3.5–5.1)
SODIUM: 143 meq/L (ref 136–145)
TOTAL PROTEIN: 6.3 g/dL — AB (ref 6.4–8.3)
Total Bilirubin: 0.47 mg/dL (ref 0.20–1.20)

## 2016-09-09 LAB — TECHNOLOGIST REVIEW

## 2016-09-09 LAB — LACTATE DEHYDROGENASE: LDH: 136 U/L (ref 125–245)

## 2016-09-09 MED ORDER — HEPARIN SOD (PORK) LOCK FLUSH 100 UNIT/ML IV SOLN
500.0000 [IU] | Freq: Once | INTRAVENOUS | Status: AC | PRN
  Administered 2016-09-09: 500 [IU]
  Filled 2016-09-09: qty 5

## 2016-09-09 MED ORDER — ACETAMINOPHEN 325 MG PO TABS
ORAL_TABLET | ORAL | Status: AC
  Filled 2016-09-09: qty 2

## 2016-09-09 MED ORDER — ACETAMINOPHEN 325 MG PO TABS
650.0000 mg | ORAL_TABLET | Freq: Once | ORAL | Status: AC
  Administered 2016-09-09: 650 mg via ORAL

## 2016-09-09 MED ORDER — SODIUM CHLORIDE 0.9 % IJ SOLN
10.0000 mL | INTRAMUSCULAR | Status: DC | PRN
  Administered 2016-09-09: 10 mL via INTRAVENOUS
  Filled 2016-09-09: qty 10

## 2016-09-09 MED ORDER — SODIUM CHLORIDE 0.9 % IV SOLN
Freq: Once | INTRAVENOUS | Status: AC
  Administered 2016-09-09: 10:00:00 via INTRAVENOUS

## 2016-09-09 MED ORDER — SODIUM CHLORIDE 0.9 % IJ SOLN
10.0000 mL | INTRAMUSCULAR | Status: AC | PRN
  Administered 2016-09-09: 10 mL
  Filled 2016-09-09: qty 10

## 2016-09-09 MED ORDER — SODIUM CHLORIDE 0.9 % IV SOLN
350.0000 mg/m2 | Freq: Once | INTRAVENOUS | Status: AC
  Administered 2016-09-09: 700 mg via INTRAVENOUS
  Filled 2016-09-09: qty 50

## 2016-09-09 MED ORDER — DIPHENHYDRAMINE HCL 25 MG PO CAPS
ORAL_CAPSULE | ORAL | Status: AC
  Filled 2016-09-09: qty 1

## 2016-09-09 MED ORDER — DIPHENHYDRAMINE HCL 25 MG PO CAPS
25.0000 mg | ORAL_CAPSULE | Freq: Once | ORAL | Status: AC
  Administered 2016-09-09: 25 mg via ORAL

## 2016-09-09 NOTE — Patient Instructions (Signed)
[facility] Discharge Instructions for Patients Receiving Chemotherapy  Today you received the following chemotherapy agents Rituxan To help prevent nausea and vomiting after your treatment, we encourage you to take your nausea medication as prescribed.  If you develop nausea and vomiting that is not controlled by your nausea medication, call the clinic.   BELOW ARE SYMPTOMS THAT SHOULD BE REPORTED IMMEDIATELY:  *FEVER GREATER THAN 100.5 F  *CHILLS WITH OR WITHOUT FEVER  NAUSEA AND VOMITING THAT IS NOT CONTROLLED WITH YOUR NAUSEA MEDICATION  *UNUSUAL [name]  *UNUSUAL BRUISING OR BLEEDING  TENDERNESS IN MOUTH AND THROAT WITH OR WITHOUT PRESENCE OF ULCERS  *URINARY PROBLEMS  *BOWEL PROBLEMS  UNUSUAL RASH Items with * indicate a potential emergency and should be followed up as soon as possible.  Feel free to call the clinic you have any questions or concerns. The clinic phone number is [phone].  Please show the CHEMO ALERT CARD at check-in to the Emergency Department and triage nurse.   

## 2016-09-16 ENCOUNTER — Encounter: Payer: Self-pay | Admitting: Cardiovascular Disease

## 2016-09-16 ENCOUNTER — Ambulatory Visit (INDEPENDENT_AMBULATORY_CARE_PROVIDER_SITE_OTHER): Payer: Medicare Other | Admitting: Cardiovascular Disease

## 2016-09-16 VITALS — BP 108/60 | HR 65 | Ht 67.0 in | Wt 181.0 lb

## 2016-09-16 DIAGNOSIS — I25118 Atherosclerotic heart disease of native coronary artery with other forms of angina pectoris: Secondary | ICD-10-CM

## 2016-09-16 DIAGNOSIS — N183 Chronic kidney disease, stage 3 unspecified: Secondary | ICD-10-CM

## 2016-09-16 DIAGNOSIS — I10 Essential (primary) hypertension: Secondary | ICD-10-CM | POA: Diagnosis not present

## 2016-09-16 DIAGNOSIS — E782 Mixed hyperlipidemia: Secondary | ICD-10-CM

## 2016-09-16 DIAGNOSIS — C61 Malignant neoplasm of prostate: Secondary | ICD-10-CM | POA: Diagnosis not present

## 2016-09-16 DIAGNOSIS — G4733 Obstructive sleep apnea (adult) (pediatric): Secondary | ICD-10-CM | POA: Diagnosis not present

## 2016-09-16 DIAGNOSIS — I480 Paroxysmal atrial fibrillation: Secondary | ICD-10-CM

## 2016-09-16 DIAGNOSIS — I209 Angina pectoris, unspecified: Secondary | ICD-10-CM

## 2016-09-16 DIAGNOSIS — Z7901 Long term (current) use of anticoagulants: Secondary | ICD-10-CM | POA: Diagnosis not present

## 2016-09-16 DIAGNOSIS — C8580 Other specified types of non-Hodgkin lymphoma, unspecified site: Secondary | ICD-10-CM | POA: Diagnosis not present

## 2016-09-16 DIAGNOSIS — E0822 Diabetes mellitus due to underlying condition with diabetic chronic kidney disease: Secondary | ICD-10-CM | POA: Diagnosis not present

## 2016-09-16 NOTE — Patient Instructions (Signed)
Dr Croitoru recommends that you schedule a follow-up appointment in 12 months. You will receive a reminder letter in the mail two months in advance. If you don't receive a letter, please call our office to schedule the follow-up appointment.  If you need a refill on your cardiac medications before your next appointment, please call your pharmacy. 

## 2016-09-16 NOTE — Progress Notes (Signed)
Cardiology Office Note:    Date:  09/16/2016   ID:  [name], DOB 09-Feb-1932, MRN [id number]  PCP:  [name], MD  Cardiologist:  Sanda Klein, MD    Referring MD: [name], MD   Chief Complaint  Patient presents with  . Follow-up    6 months    History of Present Illness:    [name] is a 83 y.o. male with a hx of coronary artery disease, hypertension, hyperlipidemia, type 2 diabetes mellitus on insulin, paroxysmal atrial fibrillation, obstructive sleep apnea on CPAP, mild chronic kidney disease, who is currently undergoing maintenance treatment with Rituxan for non-Hodgkin's lymphoma (liver primary, abdominal lymphadenopathy) as well as treatment for metastatic prostate cancer, requiring repeat placement of bilateral ureteral stents.  Despite his numerous medical problems, [name] maintains an excellent attitude and is fit and physically active. He is busy maintaining a large garden. He denies problems with exertional dyspnea or angina. He has not had recent palpitations. He denies syncope, but is occasionally lightheaded when working in the yard. This usually resolves after he eats a snack. He denies leg edema or claudication and has not had any focal neurological complaints. He has easy bruising on his forearms but has not had any serious bleeding. Every 3-6 months he requires replacement of bilateral ureteral stents. He is due to see Dr. [name] again next month. Dr. Jana Hakim oversees the chemotherapy for his lymphoma.  Cardiac catheterization in the remote past showed moderate coronary artery disease (50% LAD artery, 80% posterior lateral ventricular artery). He has preserved left ventricular systolic function most recently estimated at 55-60 %, without regional wall motion abnormalities) echo October 2016). The left atrium is described as severely dilated with a end-systolic volume index of 45 mL/m sq. has not undergone nuclear stress testing since  2007.  Diabetes control is excellent with a hemoglobin A1c of 6.7%. He has mild chronic anemia with a hemoglobin of approximately 10. He has moderate chronic kidney disease with a creatinine that hovers 1.3-1.5 range. He reports compliance with CPAP.   His most recent lipid profile showed a very low total cholesterol 115, HDL 26, LDL 24 but elevated triglycerides at 326, on combination statin and fenofibrate therapy  Past Medical History:  Diagnosis Date  . A-fib ([place])   . Anemia   . Arthritis   . Atrial fibrillation ([place])   . Cancer of liver ([place])   . Diabetes mellitus    INSULIN DEPENDENT  . GERD (gastroesophageal reflux disease)   . HOH (hard of hearing)   . HX, PERSONAL, MALIGNANCY, PROSTATE 07/28/2006   Annotation: 2001, resected Qualifier: Diagnosis of  By: [name], [name]    . Hypertension   . Kidney stone   . Lymphoma ([place])   . Memory deficit 10/18/2013  . Near syncope 10/18/2013  . Neuropathy in diabetes ([place])    Hx: of  . Prostate cancer ([place])   . [name]    with exertion   . Skin cancer   . Sleep apnea    on CPAP - has not used in a long time     Past Surgical History:  Procedure Laterality Date  . ANKLE SURGERY    . CARDIAC CATHETERIZATION  09/16/96   Normal LV systolic function,dense ca+ prox. portion of the LAD w/50% narrowing in the distal portion, 30-40% irreg. in the proximal portion & 80% narrowing in the ostial portion of the posterolateral branch.  . CHOLECYSTECTOMY  04/04/2013  . CHOLECYSTECTOMY  N/A 04/04/2013   Procedure: LAPAROSCOPIC CHOLECYSTECTOMY WITH INTRAOPERATIVE CHOLANGIOGRAM;  [name], MD;  Location: [facility];  Service: General;  [name];  . COLONOSCOPY     Hx: of  . CYSTOSCOPY W/ URETERAL STENT PLACEMENT Bilateral 06/01/2015   Procedure: CYSTOSCOPY WITH BILATERAL STENT REPLACEMENT;  Surgeon: Raynelle Bring, MD;  [facility];  Service: Urology;  Laterality: Bilateral;  . CYSTOSCOPY W/ URETERAL STENT  PLACEMENT Bilateral 10/29/2015   Procedure: CYSTOSCOPY WITH BILATERAL STENT REPLACEMENT;  Surgeon: Raynelle Bring, MD;  [facility];  Service: Urology;  Laterality: Bilateral;  . CYSTOSCOPY W/ URETERAL STENT PLACEMENT Bilateral 05/26/2016   Procedure: CYSTO URETEROSCOPY  WITH BILATERAL  STENT REPLACEMENT;  Surgeon: Raynelle Bring, MD;  [facility];  Service: Urology;  Laterality: Bilateral;  . CYSTOSCOPY WITH STENT PLACEMENT Bilateral 02/05/2015   Procedure: CYSTOSCOPY RETROGRADE AND BILATERAL  STENT PLACEMENT;  Surgeon: Kathie Rhodes, MD;  [facility];  Service: Urology;  Laterality: Bilateral;  . INFUSION PORT  04/04/2013   RIGHT SUBCLAVIAN  . KIDNEY STONE SURGERY    . MOUTH SURGERY  10/19/2015   left upper teeth removed along with palate abscess   . PORTACATH PLACEMENT N/A 04/04/2013   Procedure: INSERTION PORT-A-CATH;  [name], MD;  Location: [facility];  Service: General;  [name];  . PROSTATECTOMY    . ROTATOR CUFF REPAIR    . TRANSURETHRAL RESECTION OF BLADDER TUMOR WITH GYRUS (TURBT-GYRUS) N/A 02/05/2015   Procedure: TRANSURETHRAL RESECTION OF BLADDER TUMOR  ;  Surgeon: Kathie Rhodes, MD;  [facility];  Service: Urology;  [name];    Current Medications: Current Meds  Medication Sig  . allopurinol (ZYLOPRIM) 300 MG tablet TAKE 1 TABLET EVERY DAY  . amLODipine (NORVASC) 5 MG tablet Take 2.5 mg by mouth daily.   Marland Kitchen atorvastatin (LIPITOR) 20 MG tablet Take 20 mg by mouth at bedtime.  . Calcium-Magnesium-Vitamin D (CALCIUM 1200+D3 PO) Take 2 tablets by mouth daily with lunch.   . cetirizine (ZYRTEC) 10 MG tablet Take 10 mg by mouth daily.  . cholestyramine (QUESTRAN) 4 g packet Take 4 g by mouth 3 (three) times daily as needed (for diarrhea).   . Cyanocobalamin (VITAMIN B-12) 2500 MCG SUBL Place 2,500 mcg under the tongue daily.  . fenofibrate 160 MG tablet Take 160 mg by mouth at bedtime.  . gabapentin (NEURONTIN) 300 MG capsule Take 300 mg by  mouth 3 (three) times daily.   Marland Kitchen lidocaine-prilocaine (EMLA) cream Apply 1 application topically as needed. (Patient taking differently: Apply 1 application topically daily as needed (port numbing). )  . magnesium oxide (MAG-OX) 400 (241.3 MG) MG tablet Take 400 tablets by mouth 2 (two) times daily.   . metFORMIN (GLUCOPHAGE) 1000 MG tablet Take 1,000 mg by mouth 2 (two) times daily with a meal.   . Multiple Vitamins-Minerals (MULTIVITAMIN WITH MINERALS) tablet Take 1 tablet by mouth daily.  Marland Kitchen oxyCODONE-acetaminophen (PERCOCET) 5-325 MG tablet Take 1-2 tablets by mouth every 6 (six) hours as needed (for pain; may cause constipation).  . Rivaroxaban (XARELTO) 15 MG TABS tablet Take 15 mg by mouth daily with lunch.   . sertraline (ZOLOFT) 100 MG tablet Take 100 mg by mouth daily.  . TOUJEO SOLOSTAR 300 UNIT/ML SOPN INJECT 64 UNITS SUB-Q ONCE DAILY  . vitamin C (ASCORBIC ACID) 500 MG tablet Take 500 mg by mouth daily.     Allergies:   Atenolol and Niacin   Social History   Social  History  . Marital status: Widowed    Spouse name: N/A  . Number of children: 3  . Years of education: 9th   Occupational History  . Retired    Social History Main Topics  . Smoking status: Former Smoker    Types: Pipe, Landscape architect  . Smokeless tobacco: Former Systems developer    Types: Chew     Comment: [place] YEARS AGO "  . Alcohol use No  . Drug use: No  . Sexual activity: No   Other Topics Concern  . None   Social History Narrative  . None     Family History: The patient's family history includes Cancer in his brother, brother, and sister; Heart attack in his brother, brother, brother, brother, brother, and father; Heart disease in his father. ROS:   Please see the history of present illness.     All other systems reviewed and are negative.  EKGs/Labs/Other Studies Reviewed:    The following studies were reviewed today: Notes from Dr. [name] and Dr. Jana Hakim  EKG:  EKG is  ordered today.  The  ekg ordered today demonstrates tiny Q waves in lead 3 and maybe lead to. It's not much changed from old tracings, although the Q waves are a little more obvious  Recent Labs: 09/09/2016: ALT 37; BUN 22.4; Creatinine 1.4; HGB 9.9; Platelets 98; Potassium 3.8; Sodium 143   Recent Lipid Panel No results found for: CHOL, TRIG, HDL, CHOLHDL, VLDL, LDLCALC, LDLDIRECT  Physical Exam:    VS:  BP 108/60   Pulse 65   Ht 5' 7"  (1.702 m)   Wt 181 lb (82.1 kg)   BMI 28.35 kg/m     Wt Readings from Last 3 Encounters:  09/16/16 181 lb (82.1 kg)  07/12/16 182 lb 4.8 oz (82.7 kg)  05/26/16 174 lb (78.9 kg)     GEN:  Well nourished, well developed in no acute distress HEENT: Normal NECK: No JVD; No carotid bruits LYMPHATICS: No lymphadenopathy CARDIAC: RRR, no murmurs, rubs, gallops. Healthy Port-A-Cath site. RESPIRATORY:  Clear to auscultation without rales, wheezing or rhonchi  ABDOMEN: Soft, non-tender, non-distended MUSCULOSKELETAL:  No edema; No deformity  SKIN: Warm and dry NEUROLOGIC:  Alert and oriented x 3 PSYCHIATRIC:  Normal affect   ASSESSMENT:    1. Coronary artery disease of native artery of native heart with stable angina pectoris (HCC)   2. Paroxysmal atrial fibrillation ([place])   3. Essential hypertension   4. Mixed hyperlipidemia   5. Diabetes mellitus due to underlying condition, controlled, with stage 3 chronic kidney disease, without long-term current use of insulin ([place])   6. CKD (chronic kidney disease) stage 3, GFR 30-59 ml/min   7. OSA (obstructive sleep apnea)   8. Lymphoma malignant, large cell ([place])   9. Prostate cancer ([place])   10. Chronic anticoagulation    PLAN:    In order of problems listed above:  1. CAD: He is quite active especially for his age and does not have any angina pectoris. Risk factors are very well addressed. Not on aspirin due to treatment with anticoagulant. Amlodipine serving as his antianginal. Relatively low heart rate precludes beta  blockers. 2. PAF: No recent clinically symptomatic events. Tolerating Xarelto with only mild nuisance bleeding and no serious complications. Not receiving a beta blocker due to relative bradycardia. CHADSVasc 5 (age 72, hypertension, diabetes, CAD) 3. HTN: Well controlled 4. HLP: Very low LDL, but also low HDL level. Stubbornly elevated triglycerides despite excellent diabetes control and  treatment with fenofibrate. No changes recommended at this time 5. DM: Very well controlled. 6. CKD: Stable 7. OSA: Reports compliance with CPAP 8. NHL: Seems to be status quo on maintenance therapy with Rituxan every 8 weeks 9. Met prostate Ca: Requires ureteral stents every 3-6 months but these procedures have been performed without interruption of anticoagulation. 10. Xarelto: Without serious bleeding complications or falls. Can be held for 48 hours before small surgical procedures, 72 hours before major surgery.    Medication Adjustments/Labs and Tests Ordered: Current medicines are reviewed at length with the patient today.  Concerns regarding medicines are outlined above. Labs and tests ordered and medication changes are outlined in the patient instructions below:  Patient Instructions  Dr [name] recommends that you schedule a follow-up appointment in 12 months. You will receive a reminder letter in the mail two months in advance. If you don't receive a letter, please call our office to schedule the follow-up appointment.  If you need a refill on your cardiac medications before your next appointment, please call your pharmacy.    Signed, Sanda Klein, MD  09/16/2016 9:00 AM    [facility]

## 2016-10-17 ENCOUNTER — Ambulatory Visit (INDEPENDENT_AMBULATORY_CARE_PROVIDER_SITE_OTHER): Payer: Medicare Other

## 2016-10-17 ENCOUNTER — Telehealth (INDEPENDENT_AMBULATORY_CARE_PROVIDER_SITE_OTHER): Payer: Self-pay | Admitting: Orthopaedic Surgery

## 2016-10-17 ENCOUNTER — Ambulatory Visit (INDEPENDENT_AMBULATORY_CARE_PROVIDER_SITE_OTHER): Payer: Medicare Other | Admitting: Orthopaedic Surgery

## 2016-10-17 ENCOUNTER — Encounter (INDEPENDENT_AMBULATORY_CARE_PROVIDER_SITE_OTHER): Payer: Self-pay | Admitting: Orthopaedic Surgery

## 2016-10-17 VITALS — BP 98/56 | HR 68 | Ht 68.0 in | Wt 185.0 lb

## 2016-10-17 DIAGNOSIS — M25512 Pain in left shoulder: Secondary | ICD-10-CM

## 2016-10-17 DIAGNOSIS — I25118 Atherosclerotic heart disease of native coronary artery with other forms of angina pectoris: Secondary | ICD-10-CM

## 2016-10-17 MED ORDER — TRAMADOL HCL 50 MG PO TABS: ORAL_TABLET | ORAL | 0 refills | Status: DC

## 2016-10-17 NOTE — Telephone Encounter (Signed)
Patient's daughter called stating that her father fell and hurt his left shoulder and is having trouble lifting his arm.  Wants to know if he can be fit into Dr. Rudene Anda schedule today.  CB#[phone].

## 2016-10-17 NOTE — Progress Notes (Signed)
Office Visit Note   Patient: [name]           Date of Birth: March 30, 1932           MRN: [id number] Visit Date: 10/17/2016              Requested by: [name], MD [address] [place], [place] [address] PCP: [name], MD   Assessment & Plan: Visit Diagnoses:  1. Acute pain of left shoulder   No obvious fracture. Could be recurrent, tear of the rotator cuff  Plan: Sling, tramadol for pain, office 10 days. Monitor her progress. Consider MRI scan if continues to be a problem. Hopefully this will just simply resolves without any further intervention  Follow-Up Instructions: No Follow-up on file.   Orders:  Orders Placed This Encounter  Procedures  . XR Shoulder Left   Meds ordered this encounter  Medications  . traMADol (ULTRAM) 50 MG tablet    Sig: 1-2 BID PRN    Dispense:  30 tablet    Refill:  0      Procedures: No procedures performed   Clinical Data: No additional findings.   Subjective: Chief Complaint  Patient presents with  . Left Shoulder - Injury, Pain    Mr. Lepage is an 64 y o that has prior L RCR 10+ yrs ago and he fell yesterday (6/24) onto the L shoulder. He is on a blood thinner  with scratches on his L upper arm and daughter put non adherent gauze to stop bleeding. Hx of cancer  Mr. Garrido fell at his home earlier this morning in the middle of the night. He landed directly on his left shoulder and has been complaining of shoulder pain since that time. He has multiple comorbidities including diabetes and a history of cancer. He is on sotalol toe was a blood thinner. He also has had prior rotator cuff surgery many years ago that same shoulder  HPI  Review of Systems   Objective: Vital Signs: BP (!) 98/56   Pulse 68   Ht 5\' 8"  (1.727 m)   Wt 185 lb (83.9 kg)   BMI 28.13 kg/m   Physical Exam  Ortho Exam ecchymosis along the posterior aspect of his left shoulder but no significant edema. No significant  localized areas of palpable tenderness about the left shoulder. Biceps appears to be intact below is generalized atrophy. Good grip and release. Skin is very thin. Does have skin avulsion along the proximal forearm and distal arm which are covered with bandages. Good grip and release.  Specialty Comments:  No specialty comments available.  Imaging: Xr Shoulder Left  Result Date: 10/17/2016 Films of the left shoulder obtained in several projections. There are some arthritic changes about the humeral head but no obvious fracture. The humeral head is centered about the glenoid. Normal space between the humeral head and the acromion. All metallic anchor from rotator cuff tear surgery many years ago. Prior distal clavicle resection.    PMFS History: Patient Active Problem List   Diagnosis Date Noted  . Essential hypertension 09/16/2016  . OSA (obstructive sleep apnea) 09/16/2016  . Pre-operative cardiovascular examination 10/28/2015  . CAD (coronary artery disease) 10/28/2015  . Chronic anticoagulation 10/28/2015  . Port catheter in place 08/20/2015  . Anemia, chronic renal failure 04/02/2015  . Fever 02/04/2015  . CKD (chronic kidney disease) stage 3, GFR 30-59 ml/min 02/04/2015  . Hyponatremia 02/04/2015  . UTI (lower urinary tract infection) 02/04/2015  .  Acute on chronic renal failure ([place]) 02/04/2015  . Chronic combined systolic and diastolic heart failure, NYHA class 1 ([place]) 02/04/2015  . Pyrexia   . Urinary tract infectious disease   . Prostate cancer ([place]) 05/02/2014  . Diabetes mellitus with renal manifestations, controlled ([place]) 05/02/2014  . SSS (sick sinus syndrome) ([place]) 11/09/2013  . Paroxysmal atrial fibrillation ([place]) 11/09/2013  . Near syncope 10/18/2013  . Memory deficit 10/18/2013  . Neuropathy ([place]) 08/16/2013  . Diarrhea 08/16/2013  . Dehydration 08/16/2013  . Non Hodgkin's lymphoma ([place]) 07/02/2013  . Cellulitis diffuse, face 06/18/2013  . Hypokalemia  06/17/2013  . Facial pain 06/17/2013    Class: Acute  . DM (diabetes mellitus) type 2, uncontrolled, with ketoacidosis ([place]) 06/07/2013  . Lymphoma malignant, large cell ([place]) 05/14/2013  . Anemia in neoplastic disease 05/13/2013  . Thrombocytopenia, unspecified ([place]) 05/13/2013  . NHL (non-Hodgkin's lymphoma) ([place]) 04/29/2013  . Cholecystitis with cholelithiasis 04/04/2013  . Cholelithiasis with cholecystitis 03/13/2013  . Mixed hyperlipidemia 07/28/2006  . GERD 07/28/2006  . CHOLELITHIASIS, WITH OBSTRUCTION 07/28/2006  . OTHER POSTOPERATIVE INFECTION 07/28/2006  . NEPHROLITHIASIS, HX OF 07/28/2006  . HX, PERSONAL, MUSCULOSKELETAL DISORD NEC 07/28/2006  . CELLULITIS, ANKLE 06/28/2006  . BACTEREMIA 06/28/2006   Past Medical History:  Diagnosis Date  . A-fib ([place])   . Anemia   . Arthritis   . Atrial fibrillation ([place])   . Cancer of liver ([place])   . Diabetes mellitus    INSULIN DEPENDENT  . GERD (gastroesophageal reflux disease)   . HOH (hard of hearing)   . HX, PERSONAL, MALIGNANCY, PROSTATE 07/28/2006   Annotation: 2001, resected Qualifier: Diagnosis of  By: [name], [name]    . Hypertension   . Kidney stone   . Lymphoma ([place])   . Memory deficit 10/18/2013  . Near syncope 10/18/2013  . Neuropathy in diabetes ([place])    Hx: of  . Prostate cancer ([place])   . [name]    with exertion   . Skin cancer   . Sleep apnea    on CPAP - has not used in a long time     Family History  Problem Relation Age of Onset  . Heart disease Father   . Heart attack Father   . Cancer Sister   . Heart attack Brother   . Heart attack Brother   . Heart attack Brother   . Heart attack Brother   . Heart attack Brother   . Cancer Brother   . Cancer Brother     Past Surgical History:  Procedure Laterality Date  . ANKLE SURGERY    . CARDIAC CATHETERIZATION  09/16/96   Normal LV systolic function,dense ca+ prox. portion of the LAD w/50% narrowing in the distal portion, 30-40%  irreg. in the proximal portion & 80% narrowing in the ostial portion of the posterolateral branch.  . CHOLECYSTECTOMY  04/04/2013  . CHOLECYSTECTOMY N/A 04/04/2013   Procedure: LAPAROSCOPIC CHOLECYSTECTOMY WITH INTRAOPERATIVE CHOLANGIOGRAM;  [name], MD;  Location: [facility];  Service: General;  [name];  . COLONOSCOPY     Hx: of  . CYSTOSCOPY W/ URETERAL STENT PLACEMENT Bilateral 06/01/2015   Procedure: CYSTOSCOPY WITH BILATERAL STENT REPLACEMENT;  Surgeon: Raynelle Bring, MD;  [facility];  Service: Urology;  Laterality: Bilateral;  . CYSTOSCOPY W/ URETERAL STENT PLACEMENT Bilateral 10/29/2015   Procedure: CYSTOSCOPY WITH BILATERAL STENT REPLACEMENT;  Surgeon: Raynelle Bring, MD;  [facility];  Service: Urology;  Laterality: Bilateral;  . CYSTOSCOPY  W/ URETERAL STENT PLACEMENT Bilateral 05/26/2016   Procedure: CYSTO URETEROSCOPY  WITH BILATERAL  STENT REPLACEMENT;  Surgeon: Raynelle Bring, MD;  [facility];  Service: Urology;  Laterality: Bilateral;  . CYSTOSCOPY WITH STENT PLACEMENT Bilateral 02/05/2015   Procedure: CYSTOSCOPY RETROGRADE AND BILATERAL  STENT PLACEMENT;  Surgeon: Kathie Rhodes, MD;  [facility];  Service: Urology;  Laterality: Bilateral;  . INFUSION PORT  04/04/2013   RIGHT SUBCLAVIAN  . KIDNEY STONE SURGERY    . MOUTH SURGERY  10/19/2015   left upper teeth removed along with palate abscess   . PORTACATH PLACEMENT N/A 04/04/2013   Procedure: INSERTION PORT-A-CATH;  [name], MD;  Location: [facility];  Service: General;  [name];  . PROSTATECTOMY    . ROTATOR CUFF REPAIR    . TRANSURETHRAL RESECTION OF BLADDER TUMOR WITH GYRUS (TURBT-GYRUS) N/A 02/05/2015   Procedure: TRANSURETHRAL RESECTION OF BLADDER TUMOR  ;  Surgeon: Kathie Rhodes, MD;  [facility];  Service: Urology;  [name];   Social History   Occupational History  . Retired    Social History Main Topics  . Smoking status: Former Smoker    Types:  Pipe, Landscape architect  . Smokeless tobacco: Former Systems developer    Types: Chew     Comment: [place] YEARS AGO "  . Alcohol use No  . Drug use: No  . Sexual activity: No     Garald Balding, MD   Note - This record has been created using [employer].  Chart creation errors have been sought, but may not always  have been located. Such creation errors do not reflect on  the standard of medical care.

## 2016-10-21 DIAGNOSIS — E119 Type 2 diabetes mellitus without complications: Secondary | ICD-10-CM | POA: Diagnosis not present

## 2016-10-21 DIAGNOSIS — I10 Essential (primary) hypertension: Secondary | ICD-10-CM | POA: Diagnosis not present

## 2016-10-21 DIAGNOSIS — E559 Vitamin D deficiency, unspecified: Secondary | ICD-10-CM | POA: Diagnosis not present

## 2016-10-21 DIAGNOSIS — E785 Hyperlipidemia, unspecified: Secondary | ICD-10-CM | POA: Diagnosis not present

## 2016-10-27 DIAGNOSIS — R310 Gross hematuria: Secondary | ICD-10-CM | POA: Diagnosis not present

## 2016-10-28 ENCOUNTER — Ambulatory Visit (INDEPENDENT_AMBULATORY_CARE_PROVIDER_SITE_OTHER): Payer: Medicare Other | Admitting: Orthopaedic Surgery

## 2016-10-28 ENCOUNTER — Encounter (INDEPENDENT_AMBULATORY_CARE_PROVIDER_SITE_OTHER): Payer: Self-pay | Admitting: Orthopaedic Surgery

## 2016-10-28 VITALS — BP 123/71 | HR 78 | Ht 69.0 in | Wt 200.0 lb

## 2016-10-28 DIAGNOSIS — E1121 Type 2 diabetes mellitus with diabetic nephropathy: Secondary | ICD-10-CM | POA: Diagnosis not present

## 2016-10-28 DIAGNOSIS — F329 Major depressive disorder, single episode, unspecified: Secondary | ICD-10-CM | POA: Diagnosis not present

## 2016-10-28 DIAGNOSIS — E1140 Type 2 diabetes mellitus with diabetic neuropathy, unspecified: Secondary | ICD-10-CM | POA: Diagnosis not present

## 2016-10-28 DIAGNOSIS — M25512 Pain in left shoulder: Secondary | ICD-10-CM

## 2016-10-28 DIAGNOSIS — C61 Malignant neoplasm of prostate: Secondary | ICD-10-CM | POA: Diagnosis not present

## 2016-10-28 NOTE — Progress Notes (Signed)
Office Visit Note   Patient: [name]           Date of Birth: 13-Sep-1931           MRN: [id number] Visit Date: 10/28/2016              Requested by: [name], MD [address] [place],  [address] PCP: [name], MD   Assessment & Plan: Visit Diagnoses:  1. Acute pain of left shoulder   Feeling much better with return to normal activity. Could have recurrent rotator cuff tear but essentially asymptomatic  Plan: Return as needed  Follow-Up Instructions: Return if symptoms worsen or fail to improve.   Orders:  No orders of the defined types were placed in this encounter.  No orders of the defined types were placed in this encounter.     Procedures: No procedures performed   Clinical Data: No additional findings.   Subjective: Chief Complaint  Patient presents with  . Left Shoulder - Follow-up    [name] is an 83 y o that is here for follow up of Left shoulder pain. No sling, can raise L arm above head with good ROM.  Follow-up of left shoulder pain as previously outlined. [name] had a fall with onset of left shoulder pain. He's been on xarelto with development of a large hematoma about the left shoulder. He notes that that is resolving. He no longer uses the sling. He's been able to wear use his tractor that "doesn't have power steering". Denies any numbness or tingling.  HPI  Review of Systems   Objective: Vital Signs: BP 123/71   Pulse 78   Ht 5\' 9"  (1.753 m)   Wt 200 lb (90.7 kg)   BMI 29.53 kg/m   Physical Exam  Ortho Exam left shoulder exam with resolving ecchymosis along the posterior aspect of the shoulder. No pain with range of motion either actively or passively. Good strength with internal and external rotation. And otherwise intact. Good grip and released distally. Able to place his arm over his head without pain and with very functional flexion mild loss of external rotation which is essentially  unchanged from his prefall status  Specialty Comments:  No specialty comments available.  Imaging: No results found.   PMFS History: Patient Active Problem List   Diagnosis Date Noted  . Essential hypertension 09/16/2016  . OSA (obstructive sleep apnea) 09/16/2016  . Pre-operative cardiovascular examination 10/28/2015  . CAD (coronary artery disease) 10/28/2015  . Chronic anticoagulation 10/28/2015  . Port catheter in place 08/20/2015  . Anemia, chronic renal failure 04/02/2015  . Fever 02/04/2015  . CKD (chronic kidney disease) stage 3, GFR 30-59 ml/min 02/04/2015  . Hyponatremia 02/04/2015  . UTI (lower urinary tract infection) 02/04/2015  . Acute on chronic renal failure ([place]) 02/04/2015  . Chronic combined systolic and diastolic heart failure, NYHA class 1 ([place]) 02/04/2015  . Pyrexia   . Urinary tract infectious disease   . Prostate cancer ([place]) 05/02/2014  . Diabetes mellitus with renal manifestations, controlled ([place]) 05/02/2014  . SSS (sick sinus syndrome) ([place]) 11/09/2013  . Paroxysmal atrial fibrillation ([place]) 11/09/2013  . Near syncope 10/18/2013  . Memory deficit 10/18/2013  . Neuropathy ([place]) 08/16/2013  . Diarrhea 08/16/2013  . Dehydration 08/16/2013  . Non Hodgkin's lymphoma ([place]) 07/02/2013  . Cellulitis diffuse, face 06/18/2013  . Hypokalemia 06/17/2013  . Facial pain 06/17/2013    Class: Acute  . DM (diabetes mellitus) type 2,  uncontrolled, with ketoacidosis ([place]) 06/07/2013  . Lymphoma malignant, large cell ([place]) 05/14/2013  . Anemia in neoplastic disease 05/13/2013  . Thrombocytopenia, unspecified ([place]) 05/13/2013  . NHL (non-Hodgkin's lymphoma) ([place]) 04/29/2013  . Cholecystitis with cholelithiasis 04/04/2013  . Cholelithiasis with cholecystitis 03/13/2013  . Mixed hyperlipidemia 07/28/2006  . GERD 07/28/2006  . CHOLELITHIASIS, WITH OBSTRUCTION 07/28/2006  . OTHER POSTOPERATIVE INFECTION 07/28/2006  . NEPHROLITHIASIS, HX OF 07/28/2006  . HX,  PERSONAL, MUSCULOSKELETAL DISORD NEC 07/28/2006  . CELLULITIS, ANKLE 06/28/2006  . BACTEREMIA 06/28/2006   Past Medical History:  Diagnosis Date  . A-fib ([place])   . Anemia   . Arthritis   . Atrial fibrillation ([place])   . Cancer of liver ([place])   . Diabetes mellitus    INSULIN DEPENDENT  . GERD (gastroesophageal reflux disease)   . HOH (hard of hearing)   . HX, PERSONAL, MALIGNANCY, PROSTATE 07/28/2006   Annotation: 2001, resected Qualifier: Diagnosis of  By: [name], [name]    . Hypertension   . Kidney stone   . Lymphoma ([place])   . Memory deficit 10/18/2013  . Near syncope 10/18/2013  . Neuropathy in diabetes ([place])    Hx: of  . Prostate cancer ([place])   . [name]    with exertion   . Skin cancer   . Sleep apnea    on CPAP - has not used in a long time     Family History  Problem Relation Age of Onset  . Heart disease Father   . Heart attack Father   . Cancer Sister   . Heart attack Brother   . Heart attack Brother   . Heart attack Brother   . Heart attack Brother   . Heart attack Brother   . Cancer Brother   . Cancer Brother     Past Surgical History:  Procedure Laterality Date  . ANKLE SURGERY    . CARDIAC CATHETERIZATION  09/16/96   Normal LV systolic function,dense ca+ prox. portion of the LAD w/50% narrowing in the distal portion, 30-40% irreg. in the proximal portion & 80% narrowing in the ostial portion of the posterolateral branch.  . CHOLECYSTECTOMY  04/04/2013  . CHOLECYSTECTOMY N/A 04/04/2013   Procedure: LAPAROSCOPIC CHOLECYSTECTOMY WITH INTRAOPERATIVE CHOLANGIOGRAM;  [name], MD;  Location: [facility];  Service: General;  [name];  . COLONOSCOPY     Hx: of  . CYSTOSCOPY W/ URETERAL STENT PLACEMENT Bilateral 06/01/2015   Procedure: CYSTOSCOPY WITH BILATERAL STENT REPLACEMENT;  Surgeon: Raynelle Bring, MD;  Location: [facility];  Service: Urology;  Laterality: Bilateral;  . CYSTOSCOPY W/ URETERAL STENT PLACEMENT Bilateral 10/29/2015    Procedure: CYSTOSCOPY WITH BILATERAL STENT REPLACEMENT;  Surgeon: Raynelle Bring, MD;  Location: [facility];  Service: Urology;  Laterality: Bilateral;  . CYSTOSCOPY W/ URETERAL STENT PLACEMENT Bilateral 05/26/2016   Procedure: CYSTO URETEROSCOPY  WITH BILATERAL  STENT REPLACEMENT;  Surgeon: Raynelle Bring, MD;  Location: [facility];  Service: Urology;  Laterality: Bilateral;  . CYSTOSCOPY WITH STENT PLACEMENT Bilateral 02/05/2015   Procedure: CYSTOSCOPY RETROGRADE AND BILATERAL  STENT PLACEMENT;  Surgeon: Kathie Rhodes, MD;  Location: [facility];  Service: Urology;  Laterality: Bilateral;  . INFUSION PORT  04/04/2013   RIGHT SUBCLAVIAN  . KIDNEY STONE SURGERY    . MOUTH SURGERY  10/19/2015   left upper teeth removed along with palate abscess   . PORTACATH PLACEMENT N/A 04/04/2013   Procedure: INSERTION PORT-A-CATH;  [name], MD;  Location: [facility];  Service: General;  [name];  . PROSTATECTOMY    . ROTATOR CUFF REPAIR    . TRANSURETHRAL RESECTION OF BLADDER TUMOR WITH GYRUS (TURBT-GYRUS) N/A 02/05/2015   Procedure: TRANSURETHRAL RESECTION OF BLADDER TUMOR  ;  Surgeon: Kathie Rhodes, MD;  Location: [facility];  Service: Urology;  [name];   Social History   Occupational History  . Retired    Social History Main Topics  . Smoking status: Former Smoker    Types: Pipe, Landscape architect  . Smokeless tobacco: Former Systems developer    Types: Chew     Comment: [place] YEARS AGO "  . Alcohol use No  . Drug use: No  . Sexual activity: No     Garald Balding, MD   Note - This record has been created using [employer].  Chart creation errors have been sought, but may not always  have been located. Such creation errors do not reflect on  the standard of medical care.

## 2016-10-31 DIAGNOSIS — Z0000 Encounter for general adult medical examination without abnormal findings: Secondary | ICD-10-CM | POA: Diagnosis not present

## 2016-11-03 ENCOUNTER — Ambulatory Visit: Payer: Medicare Other

## 2016-11-03 ENCOUNTER — Ambulatory Visit (HOSPITAL_BASED_OUTPATIENT_CLINIC_OR_DEPARTMENT_OTHER): Payer: Medicare Other

## 2016-11-03 ENCOUNTER — Other Ambulatory Visit (HOSPITAL_BASED_OUTPATIENT_CLINIC_OR_DEPARTMENT_OTHER): Payer: Medicare Other

## 2016-11-03 VITALS — BP 110/63 | HR 62 | Temp 97.9°F | Resp 18

## 2016-11-03 DIAGNOSIS — C61 Malignant neoplasm of prostate: Secondary | ICD-10-CM

## 2016-11-03 DIAGNOSIS — C8580 Other specified types of non-Hodgkin lymphoma, unspecified site: Secondary | ICD-10-CM

## 2016-11-03 DIAGNOSIS — Z5112 Encounter for antineoplastic immunotherapy: Secondary | ICD-10-CM

## 2016-11-03 DIAGNOSIS — C8593 Non-Hodgkin lymphoma, unspecified, intra-abdominal lymph nodes: Secondary | ICD-10-CM

## 2016-11-03 DIAGNOSIS — C8219 Follicular lymphoma grade II, extranodal and solid organ sites: Secondary | ICD-10-CM

## 2016-11-03 DIAGNOSIS — C8590 Non-Hodgkin lymphoma, unspecified, unspecified site: Secondary | ICD-10-CM

## 2016-11-03 DIAGNOSIS — Z95828 Presence of other vascular implants and grafts: Secondary | ICD-10-CM

## 2016-11-03 DIAGNOSIS — C8203 Follicular lymphoma grade I, intra-abdominal lymph nodes: Secondary | ICD-10-CM

## 2016-11-03 LAB — COMPREHENSIVE METABOLIC PANEL
ALT: 31 U/L (ref 0–55)
ANION GAP: 12 meq/L — AB (ref 3–11)
AST: 35 U/L — ABNORMAL HIGH (ref 5–34)
Albumin: 4.1 g/dL (ref 3.5–5.0)
Alkaline Phosphatase: 68 U/L (ref 40–150)
BUN: 30.9 mg/dL — ABNORMAL HIGH (ref 7.0–26.0)
CHLORIDE: 105 meq/L (ref 98–109)
CO2: 22 meq/L (ref 22–29)
Calcium: 10 mg/dL (ref 8.4–10.4)
Creatinine: 1.8 mg/dL — ABNORMAL HIGH (ref 0.7–1.3)
EGFR: 34 mL/min/{1.73_m2} — AB (ref >90)
Glucose: 98 mg/dl (ref 70–140)
POTASSIUM: 4.3 meq/L (ref 3.5–5.1)
Sodium: 139 mEq/L (ref 136–145)
Total Bilirubin: 0.53 mg/dL (ref 0.20–1.20)
Total Protein: 6.7 g/dL (ref 6.4–8.3)

## 2016-11-03 LAB — CBC WITH DIFFERENTIAL/PLATELET
BASO%: 0.2 % (ref 0.0–2.0)
Basophils Absolute: 0 10*3/uL (ref 0.0–0.1)
EOS ABS: 0.1 10*3/uL (ref 0.0–0.5)
EOS%: 2.5 % (ref 0.0–7.0)
HCT: 33 % — ABNORMAL LOW (ref 38.4–49.9)
HGB: 10.8 g/dL — ABNORMAL LOW (ref 13.0–17.1)
LYMPH%: 6.4 % — AB (ref 14.0–49.0)
MCH: 28.4 pg (ref 27.2–33.4)
MCHC: 32.7 g/dL (ref 32.0–36.0)
MCV: 86.8 fL (ref 79.3–98.0)
MONO#: 0.4 10*3/uL (ref 0.1–0.9)
MONO%: 7.6 % (ref 0.0–14.0)
NEUT#: 3.9 10*3/uL (ref 1.5–6.5)
NEUT%: 83.3 % — AB (ref 39.0–75.0)
PLATELETS: 136 10*3/uL — AB (ref 140–400)
RBC: 3.8 10*6/uL — AB (ref 4.20–5.82)
RDW: 15.1 % — ABNORMAL HIGH (ref 11.0–14.6)
WBC: 4.7 10*3/uL (ref 4.0–10.3)
lymph#: 0.3 10*3/uL — ABNORMAL LOW (ref 0.9–3.3)

## 2016-11-03 LAB — LACTATE DEHYDROGENASE: LDH: 143 U/L (ref 125–245)

## 2016-11-03 MED ORDER — SODIUM CHLORIDE 0.9 % IJ SOLN
10.0000 mL | INTRAMUSCULAR | Status: DC | PRN
  Administered 2016-11-03: 10 mL
  Filled 2016-11-03: qty 10

## 2016-11-03 MED ORDER — SODIUM CHLORIDE 0.9 % IV SOLN
Freq: Once | INTRAVENOUS | Status: AC
  Administered 2016-11-03: 09:00:00 via INTRAVENOUS

## 2016-11-03 MED ORDER — ACETAMINOPHEN 325 MG PO TABS
650.0000 mg | ORAL_TABLET | Freq: Once | ORAL | Status: AC
  Administered 2016-11-03: 650 mg via ORAL

## 2016-11-03 MED ORDER — ACETAMINOPHEN 325 MG PO TABS
ORAL_TABLET | ORAL | Status: AC
  Filled 2016-11-03: qty 2

## 2016-11-03 MED ORDER — SODIUM CHLORIDE 0.9 % IJ SOLN
10.0000 mL | INTRAMUSCULAR | Status: DC | PRN
  Administered 2016-11-03: 10 mL via INTRAVENOUS
  Filled 2016-11-03: qty 10

## 2016-11-03 MED ORDER — HEPARIN SOD (PORK) LOCK FLUSH 100 UNIT/ML IV SOLN
500.0000 [IU] | Freq: Once | INTRAVENOUS | Status: AC | PRN
  Administered 2016-11-03: 500 [IU]
  Filled 2016-11-03: qty 5

## 2016-11-03 MED ORDER — DIPHENHYDRAMINE HCL 25 MG PO CAPS
25.0000 mg | ORAL_CAPSULE | Freq: Once | ORAL | Status: AC
  Administered 2016-11-03: 25 mg via ORAL

## 2016-11-03 MED ORDER — SODIUM CHLORIDE 0.9 % IV SOLN
350.0000 mg/m2 | Freq: Once | INTRAVENOUS | Status: AC
  Administered 2016-11-03: 700 mg via INTRAVENOUS
  Filled 2016-11-03: qty 50

## 2016-11-03 MED ORDER — DIPHENHYDRAMINE HCL 25 MG PO CAPS
ORAL_CAPSULE | ORAL | Status: AC
  Filled 2016-11-03: qty 2

## 2016-11-03 NOTE — Patient Instructions (Signed)
[facility] Discharge Instructions for Patients Receiving Chemotherapy  Today you received the following chemotherapy agents Rituxan To help prevent nausea and vomiting after your treatment, we encourage you to take your nausea medication as prescribed.  If you develop nausea and vomiting that is not controlled by your nausea medication, call the clinic.   BELOW ARE SYMPTOMS THAT SHOULD BE REPORTED IMMEDIATELY:  *FEVER GREATER THAN 100.5 F  *CHILLS WITH OR WITHOUT FEVER  NAUSEA AND VOMITING THAT IS NOT CONTROLLED WITH YOUR NAUSEA MEDICATION  *UNUSUAL [name]  *UNUSUAL BRUISING OR BLEEDING  TENDERNESS IN MOUTH AND THROAT WITH OR WITHOUT PRESENCE OF ULCERS  *URINARY PROBLEMS  *BOWEL PROBLEMS  UNUSUAL RASH Items with * indicate a potential emergency and should be followed up as soon as possible.  Feel free to call the clinic you have any questions or concerns. The clinic phone number is [phone].  Please show the CHEMO ALERT CARD at check-in to the Emergency Department and triage nurse.   

## 2016-11-14 ENCOUNTER — Other Ambulatory Visit: Payer: Self-pay

## 2016-11-16 DIAGNOSIS — Z5111 Encounter for antineoplastic chemotherapy: Secondary | ICD-10-CM | POA: Diagnosis not present

## 2016-11-16 DIAGNOSIS — C61 Malignant neoplasm of prostate: Secondary | ICD-10-CM | POA: Diagnosis not present

## 2016-11-16 DIAGNOSIS — N131 Hydronephrosis with ureteral stricture, not elsewhere classified: Secondary | ICD-10-CM | POA: Diagnosis not present

## 2016-11-17 DIAGNOSIS — L570 Actinic keratosis: Secondary | ICD-10-CM | POA: Diagnosis not present

## 2016-11-17 DIAGNOSIS — L821 Other seborrheic keratosis: Secondary | ICD-10-CM | POA: Diagnosis not present

## 2016-11-17 DIAGNOSIS — Z85828 Personal history of other malignant neoplasm of skin: Secondary | ICD-10-CM | POA: Diagnosis not present

## 2016-11-18 ENCOUNTER — Other Ambulatory Visit: Payer: Self-pay | Admitting: Urology

## 2016-11-21 ENCOUNTER — Other Ambulatory Visit: Payer: Self-pay | Admitting: Urology

## 2016-11-24 ENCOUNTER — Encounter (HOSPITAL_COMMUNITY)
Admission: RE | Admit: 2016-11-24 | Discharge: 2016-11-24 | Disposition: A | Discharge status: Home / Self Care | Payer: Medicare Other | Source: Ambulatory Visit | Attending: Urology | Admitting: Urology

## 2016-11-24 ENCOUNTER — Encounter (HOSPITAL_COMMUNITY): Payer: Self-pay

## 2016-11-24 DIAGNOSIS — N135 Crossing vessel and stricture of ureter without hydronephrosis: Secondary | ICD-10-CM | POA: Diagnosis not present

## 2016-11-24 DIAGNOSIS — C61 Malignant neoplasm of prostate: Secondary | ICD-10-CM | POA: Insufficient documentation

## 2016-11-24 DIAGNOSIS — Z01812 Encounter for preprocedural laboratory examination: Secondary | ICD-10-CM | POA: Diagnosis not present

## 2016-11-24 LAB — BASIC METABOLIC PANEL
ANION GAP: 6 (ref 5–15)
BUN: 30 mg/dL — ABNORMAL HIGH (ref 6–20)
CO2: 26 mmol/L (ref 22–32)
Calcium: 8.9 mg/dL (ref 8.9–10.3)
Chloride: 106 mmol/L (ref 101–111)
Creatinine, Ser: 1.79 mg/dL — ABNORMAL HIGH (ref 0.61–1.24)
GFR calc Af Amer: 39 mL/min — ABNORMAL LOW (ref >60)
GFR calc non Af Amer: 34 mL/min — ABNORMAL LOW (ref >60)
GLUCOSE: 175 mg/dL — AB (ref 65–99)
POTASSIUM: 4.5 mmol/L (ref 3.5–5.1)
Sodium: 138 mmol/L (ref 135–145)

## 2016-11-24 LAB — HEPATIC FUNCTION PANEL
ALT: 29 U/L (ref 17–63)
AST: 40 U/L (ref 15–41)
Albumin: 4.1 g/dL (ref 3.5–5.0)
Alkaline Phosphatase: 50 U/L (ref 38–126)
BILIRUBIN TOTAL: 0.2 mg/dL — AB (ref 0.3–1.2)
Total Protein: 6.8 g/dL (ref 6.5–8.1)

## 2016-11-24 LAB — CBC
HEMATOCRIT: 31.3 % — AB (ref 39.0–52.0)
HEMOGLOBIN: 10.3 g/dL — AB (ref 13.0–17.0)
MCH: 27.6 pg (ref 26.0–34.0)
MCHC: 32.9 g/dL (ref 30.0–36.0)
MCV: 83.9 fL (ref 78.0–100.0)
Platelets: 148 10*3/uL — ABNORMAL LOW (ref 150–400)
RBC: 3.73 MIL/uL — AB (ref 4.22–5.81)
RDW: 14.9 % (ref 11.5–15.5)
WBC: 5 10*3/uL (ref 4.0–10.5)

## 2016-11-24 LAB — GLUCOSE, CAPILLARY: Glucose-Capillary: 181 mg/dL — ABNORMAL HIGH (ref 65–99)

## 2016-11-24 NOTE — Patient Instructions (Addendum)
[name]  11/24/2016   Your procedure is scheduled on: 12-01-16  Report to [facility]  elevators to 3rd floor to  [facility] at [phone].   Call this number if you have problems the morning of surgery [phone]   Remember: ONLY 1 PERSON MAY GO WITH YOU TO SHORT STAY TO GET  READY MORNING OF [place].  Do not eat food or drink liquids :After Midnight.     Take these medicines the morning of surgery with A SIP OF WATER: allopurinol(zyloprim), amlodipine, zyrtec, gabapentin, sertraline(zoloft)                                 You may not have any metal on your body including hair pins and              piercings  Do not wear jewelry, make-up, lotions, powders or perfumes, deodorant                    Men may shave face and neck.   Do not bring valuables to the hospital. [place].  Contacts, dentures or bridgework may not be worn into surgery.      Patients discharged the day of surgery will not be allowed to drive home.  Name and phone number of your driver:  Special Instructions: N/A              Please read over the following fact sheets you were given: _____________________________________________________________________             How to Manage Your Diabetes Before and After Surgery  Why is it important to control my blood sugar before and after surgery? . Improving blood sugar levels before and after surgery helps healing and can limit problems. . A way of improving blood sugar control is eating a healthy diet by: o  Eating less sugar and carbohydrates o  Increasing activity/exercise o  Talking with your doctor about reaching your blood sugar goals . High blood sugars (greater than 180 mg/dL) can raise your risk of infections and slow your recovery, so you will need to focus on controlling your diabetes during the weeks before surgery. . Make sure that  the doctor who takes care of your diabetes knows about your planned surgery including the date and location.  How do I manage my blood sugar before surgery? . Check your blood sugar at least 4 times a day, starting 2 days before surgery, to make sure that the level is not too high or low. o Check your blood sugar the morning of your surgery when you wake up and every 2 hours until you get to the Short Stay unit. . If your blood sugar is less than 70 mg/dL, you will need to treat for low blood sugar: o Do not take insulin. o Treat a low blood sugar (less than 70 mg/dL) with  cup of clear juice (cranberry or apple), 4 glucose tablets, OR glucose gel. o Recheck blood sugar in 15 minutes after treatment (to make sure it is greater than 70 mg/dL). If your blood sugar is not greater than 70 mg/dL on recheck, call [phone] for further instructions. . Report your  blood sugar to the short stay nurse when you get to Short Stay.  . If you are admitted to the hospital after surgery: o Your blood sugar will be checked by the staff and you will probably be given insulin after surgery (instead of oral diabetes medicines) to make sure you have good blood sugar levels. o The goal for blood sugar control after surgery is 80-180 mg/dL.   WHAT DO I DO ABOUT MY DIABETES MEDICATION?  Marland Kitchen Do not take oral diabetes medicines (pills) the morning of surgery.  . THE DAY BEFORE SURGERY 11-30-16,     take  64  units of Toujeo [insulin glargine]  As normal   Take METFORMIN as normal      . THE MORNING OF SURGERY 12-01-16 ,    take  32  units of Toujeo [insulin glargine]  .   DO NO TAKE METFORMIN!!     .   Patient Signature:  Date:   Nurse Signature:  Date:   Reviewed and Endorsed by [facility] Patient Education Committee, August 2015  [facility] - Preparing for Surgery Before surgery, you can play an important role.  Because skin is not sterile, your skin needs to be as free of germs as possible.  You can  reduce the number of germs on your skin by washing with CHG (chlorahexidine gluconate) soap before surgery.  CHG is an antiseptic cleaner which kills germs and bonds with the skin to continue killing germs even after washing. Please DO NOT use if you have an allergy to CHG or antibacterial soaps.  If your skin becomes reddened/irritated stop using the CHG and inform your nurse when you arrive at Short Stay. Do not shave (including legs and underarms) for at least 48 hours prior to the first CHG shower.  You may shave your face/neck. Please follow these instructions carefully:  1.  Shower with CHG Soap the night before surgery and the  morning of Surgery.  2.  If you choose to wash your hair, wash your hair first as usual with your  normal  shampoo.  3.  After you shampoo, rinse your hair and body thoroughly to remove the  shampoo.                           4.  Use CHG as you would any other liquid soap.  You can apply chg directly  to the skin and wash                       Gently with a scrungie or clean washcloth.  5.  Apply the CHG Soap to your body ONLY FROM THE NECK DOWN.   Do not use on face/ open                           Wound or open sores. Avoid contact with eyes, ears mouth and genitals (private parts).                       Wash face,  Genitals (private parts) with your normal soap.             6.  Wash thoroughly, paying special attention to the area where your surgery  will be performed.  7.  Thoroughly rinse your body with warm water from the neck down.  8.  DO NOT shower/wash with your  normal soap after using and rinsing off  the CHG Soap.                9.  Pat yourself dry with a clean towel.            10.  Wear clean pajamas.            11.  Place clean sheets on your bed the night of your first shower and do not  sleep with pets. Day of Surgery : Do not apply any lotions/deodorants the morning of surgery.  Please wear clean clothes to the hospital/surgery center.  FAILURE TO  FOLLOW THESE INSTRUCTIONS MAY RESULT IN THE CANCELLATION OF YOUR SURGERY PATIENT SIGNATURE_________________________________  NURSE SIGNATURE__________________________________  ________________________________________________________________________

## 2016-11-24 NOTE — Progress Notes (Signed)
Urine culture 11-16-16 on chart; done at [facility] urology  Hemoglobin a1c 10-21-16  On chart   LOV cardiology Dr [name] 09-16-16 epic  "CAD: He is quite active especially for his age and does not have any angina pectoris. Risk factors are very well addressed. Not on aspirin due to treatment with anticoagulant. Amlodipine serving as his antianginal. Relatively low heart rate precludes beta blockers. 2. PAF: No recent clinically symptomatic events. Tolerating Xarelto with only mild nuisance bleeding and no serious complications. Not receiving a beta blocker due to relative bradycardia. CHADSVasc 5 (age 89, hypertension, diabetes, CAD) 3. HTN: Well controlled"  EKG 5-25-1 8  Epic  ECHO 02-05-15 "Aortic valve:   Trileaflet; normal  thickness, mildly calcified  leaflets. Mobility was not restricted. Sclerosis  without stenosis.  Doppler:  There was no regurgitation"

## 2016-11-25 LAB — HEMOGLOBIN A1C
Hgb A1c MFr Bld: 6.1 % — ABNORMAL HIGH (ref 4.8–5.6)
Mean Plasma Glucose: 128 mg/dL

## 2016-11-28 NOTE — Progress Notes (Signed)
BMP routed via epic to Dr [name]

## 2016-11-29 DIAGNOSIS — I4891 Unspecified atrial fibrillation: Secondary | ICD-10-CM | POA: Diagnosis not present

## 2016-11-29 DIAGNOSIS — E119 Type 2 diabetes mellitus without complications: Secondary | ICD-10-CM | POA: Diagnosis not present

## 2016-11-29 DIAGNOSIS — I10 Essential (primary) hypertension: Secondary | ICD-10-CM | POA: Diagnosis not present

## 2016-11-29 DIAGNOSIS — E1140 Type 2 diabetes mellitus with diabetic neuropathy, unspecified: Secondary | ICD-10-CM | POA: Diagnosis not present

## 2016-11-30 NOTE — H&P (Signed)
Office Visit Report     11/16/2016   --------------------------------------------------------------------------------   [name]  MRN: [id number]  PRIMARY CARE:  [name], MD  DOB: 07-May-1931, 83 year old Male  REFERRING:  Jimmey Ralph, NP  SSN: [id number]  PROVIDER:  Raynelle Bring, M.D.    LOCATION:  [facility] [phone]   --------------------------------------------------------------------------------   CC/HPI: Metastatic prostate cancer and bilateral ureteral obstruction   Mr. Maese returns today for further management of his metastatic prostate cancer. He has continued to tolerate androgen deprivation therapy quite well. He continues to deny any significant fatigue or hot flashes. He is continuing to receive therapy for his B-cell lymphoma and continues to tolerate this quite well. He is receiving Rituxan every 8 weeks and is currently scheduled for his next round of chemotherapy in early or mid September. He continues to remain quite active outside and denies any significant new pain symptoms that have been progressive. He did have one episode of hematuria that lasted approximately 1 week. This has cleared. He has denied any dysuria, fever, or significant flank pain. He does have expected urgency and frequency.     ALLERGIES: Niacin ER CPCR    MEDICATIONS: Allopurinol 300 MG Oral Tablet Oral  Amlodipine Besylate 5 mg tablet Oral  Atorvastatin Calcium 20 mg tablet Oral  Besivance 0.6 % suspension, drops Ophthalmic  Calcium 600-Vit D3 600 mg calcium (1,500 mg)-200 unit tablet Oral  Cefuroxime 250 mg tablet Oral  Cetirizine Hcl 10 mg tablet Oral  Cholestyramine 4 gram powder Oral  Durezol 0.05 % drops Ophthalmic  Fenofibrate 160 mg tablet Oral  Gabapentin 300 mg capsule Oral  Humulin N 100 unit/ml vial Subcutaneous  Hydrocodone-Acetaminophen 5-325 MG Oral Tablet Oral  Lupron Depot 30 mg (4 month) syringe kit 30 mg IM Administered by: Cristobal Goldmann   Magnesium Oxide 400 mg tablet Oral  Metformin Hcl 1,000 mg tablet Oral  Multivitamins tablet Oral  Questran 4 gram powder Oral  Sertraline Hcl 100 mg tablet Oral  Toujeo Solostar 300 unit/ml (1.5 ml) insulin pen Subcutaneous  Vitamin B-12 TABS Oral  Vitamin C 500 mg tablet Oral  Vitamin D3 2,000 unit tablet Oral  Xarelto 15 mg tablet Oral  Zetia 10 mg tablet Oral     GU PSH: Cystoscopy Insert Stent, Bilateral - 05/26/2016, Bilateral - 10/29/2015, 06/08/2015, 02/23/2015 Cystoscopy TURBT <2 cm - 02/23/2015 Remove Prostate. - 2014      PSH Notes: Kidney Surgery   NON-GU PSH: Ankle Arthroscopy/surgery Shoulder Surgery (Unspecified)    GU PMH: Gross hematuria (Stable, Chronic), Culture urine. No ABX unless culture proven UTI. Reassured both pt and daughter that intermittent gross hematuria along with microscopic hematuria not unexpected with long term indwelling stents. Today's UA has small amount of microscopic hematuria. Pt will f/u as scheduled later this month with Dr. [name] to discuss cysto/stent exchange - 10/27/2016, Gross hematuria, - 01/28/2015 Hydronephrosis Unspec - 02/10/2016 Prostate Cancer, Adenocarcinoma of prostate - 08/19/2015 Kidney Failure, acute, Unspec, Acute kidney injury - 04/19/2015 Urinary Tract Inf, Unspec site, Urinary tract infection - 02/19/2015 Urinary Urgency, Urinary urgency - 01/28/2015      PMH Notes:   1) Prostate cancer: He is s/p primary surgical with a radical prostatectomy in 2001 for pT3b N0 Mx, Gleason 3+4=7 adenocarcinoma of the prostate. He developed a biochemical recurrence in December 2006 when his PSA was noted to be 0.6. His PSA had increased slowly and was only 1.4 in January 2010 but further increased to  8.01 in November 2014. At that time, a CT scan was performed that demonstrated a retroperitoneal mass/lymphadenopathy and it was found to be recurrent B cell lymphoma. He had initially been diagnosed with lymphoma in 2006 and was treated with  CHOP chemotherapy and rituximab which he stopped in 2008. After his recurrence of lymphoma, he was given options and refused a stem cell transplant and was again treated with chemotherapy and maintenance rituximab. His PSA at the time of his initial consultation with me in November 2015 was 15.83. After reviewing options for management, he and his family wished to avoid systemic therapy unless absolutely necessary. He developed measurable metastatic disease to the bone in November 2016 and bilateral ureteral obstruction due to locally advanced prostate cancer. He began systemic ADT in November 2016.   Nov 2016: Began systemic ADT for measurable metastatic disease   2) Urinary retention: He has a history of urinary retention after a laparoscopic cholecystectomy in December 2014 but passed a voiding trial without problems since then.   3) Bilateral ureteral obstruction: He was incidentally noted to have left hydronephrosis on PET imaging for his lymphoma and confirmed on his bone scan imaging for prostate cancer in early 2016. No clear etiology for obstruction was noted on his imaging including no lymphadenopathy. After a discussion with his family including a discussion about renogram imaging to determine if there was obstruction present, he and his family chose to proceed with observation and avoid further imaging as they wished to avoid any intervention (such as stenting) understanding the risk of loss of renal function that may occur. In October 2016, he was noted to have development of bilateral hydronephrosis and worsening renal function prompting cystoscopy that revealed a bladder mass as the likely cause of obstruction. He required bilateral ureteral stenting in October 2016 with stabilization of his renal function.   Last stent change: 05/26/16     NON-GU PMH: Lymphoma, History, History of malignant lymphoma - 04/19/2015 Personal history of other diseases of the musculoskeletal system and connective  tissue, History of arthritis - 2014 Personal history of other diseases of the nervous system and sense organs, History of sleep apnea - 2014 Diabetes Type 2 Hypercholesterolemia Hypertension    FAMILY HISTORY: cardiac disorder - Runs In Family Diabetes - Runs In Family   SOCIAL HISTORY: Marital Status: Married Preferred Language: English; Ethnicity: Not Hispanic Or Latino; Race: White Current Smoking Status: Patient does not smoke anymore. Has not smoked since 04/29/2016.   Tobacco Use Assessment Completed: Used Tobacco in last 30 days? Does not drink anymore.  Drinks 1 caffeinated drink per day.    REVIEW OF SYSTEMS:    GU Review Male:   Patient denies frequent urination, hard to postpone urination, burning/ pain with urination, get up at night to urinate, leakage of urine, stream starts and stops, trouble starting your streams, and have to strain to urinate .  Gastrointestinal (Upper):   Patient denies nausea and vomiting.  Gastrointestinal (Lower):   Patient reports diarrhea. Patient denies constipation.  Constitutional:   Patient denies fever, night sweats, weight loss, and fatigue.  Skin:   Patient reports skin rash/ lesion. Patient denies itching.  Eyes:   Patient denies blurred vision and double vision.  Ears/ Nose/ Throat:   Patient reports sinus problems. Patient denies sore throat.  Hematologic/Lymphatic:   Patient reports easy bruising. Patient denies swollen glands.  Cardiovascular:   Patient denies leg swelling and chest pains.  Respiratory:   Patient reports shortness of breath. Patient  denies cough.  Endocrine:   Patient denies excessive thirst.  Musculoskeletal:   Patient denies back pain and joint pain.  Neurological:   Patient reports headaches and dizziness.   Psychologic:   Patient denies depression and anxiety.   VITAL SIGNS:      11/16/2016 09:06 AM  Weight 175 lb / 79.38 kg  Height 68 in / 172.72 cm  BP 102/50 mmHg  Pulse 69 /min  BMI 26.6 kg/m    MULTI-SYSTEM PHYSICAL EXAMINATION:    Constitutional: Well-nourished. No physical deformities. Normally developed. Good grooming.  Neck: Neck symmetrical, not swollen. Normal tracheal position.  Respiratory: No labored breathing, no use of accessory muscles.   Cardiovascular: Normal temperature, normal extremity pulses, no swelling, no varicosities.     PAST DATA REVIEWED:  Source Of History:  Patient  Urine Test Review:   Urinalysis   05/18/16 01/06/16 08/29/15 05/02/15 02/20/15 01/23/15 10/02/14 05/20/14  PSA  Total PSA 0.056 ng/dl 0.04 ng/dl 0.22  0.74  12.58  31.48  20.75  20.62     01/06/16 08/28/15 04/11/05 01/12/05 01/09/04  Hormones  Testosterone, Total 30.9 pg/dL 36  2.43  1.89  1.61     PROCEDURES:          Urinalysis w/Scope Dipstick Dipstick Cont'd Micro  Color: Yellow Bilirubin: Neg WBC/hpf: 0 - 5/hpf  Appearance: Clear Ketones: Neg RBC/hpf: 0 - 2/hpf  Specific Gravity: 1.020 Blood: Neg Bacteria: NS (Not Seen)  pH: 6.0 Protein: 1+ Cystals: NS (Not Seen)  Glucose: Neg Urobilinogen: 0.2 Casts: NS (Not Seen)    Nitrites: Neg Trichomonas: Not Present    Leukocyte Esterase: Neg Mucous: Present      Epithelial Cells: 0 - 5/hpf      Yeast: NS (Not Seen)      Sperm: Not Present         Lupron Depot 45 mg / 6 Month - [id number], [id number] The hip was sterilely prepped with alcohol. Lupron was injected intramuscularly (IM) using standard technique. The patient will return as scheduled for his next Lupron injection.   Qty: 45 Adm. By: Sidonie Dickens  Unit: mg Lot No [id number]  Route: IM Exp. Date 03/07/2018  Freq: None Mfgr.:   Site: Right Buttock   ASSESSMENT:      ICD-10 Details  1 GU:   Ureteral obstruction - N13.1   2   Prostate Cancer - C61    PLAN:           Orders Labs PSA, Urine Culture          Schedule Labs: 6 Months - Urinalysis  Return Visit/Planned Activity: 6 Months - Office Visit, Lupron          Document Letter(s):  Created for Patient:  Clinical Summary         Notes:   1. Metastatic prostate cancer: His PSA will be checked today. He will receive Lupron 45 mg and continue androgen deprivation assuming that he continues to have an excellent response. He will follow up in 6 months for further follow-up and for ongoing therapy.   2. Bilateral ureteral obstruction: He will be scheduled to proceed with cystoscopy and bilateral ureteral stent change in the near future. We have reviewed this procedure in detail and he gives consent to proceed.   3. Bone health/testosterone deficiency: We will consider possibly proceeding with a DEXA scan after his next visit.   CC: Dr. [name]  Dr. Gunnar Bulla Magrinat    * Signed by Raynelle Bring, M.D.  on 11/16/16 at 11:47 PM (EDT)*

## 2016-12-01 ENCOUNTER — Ambulatory Visit (HOSPITAL_COMMUNITY)
Admission: RE | Admit: 2016-12-01 | Discharge: 2016-12-01 | Disposition: A | Discharge status: Home / Self Care | Payer: Medicare Other | Source: Ambulatory Visit | Attending: Urology | Admitting: Urology

## 2016-12-01 ENCOUNTER — Ambulatory Visit (HOSPITAL_COMMUNITY): Payer: Medicare Other | Admitting: Certified Registered Nurse Anesthetist

## 2016-12-01 ENCOUNTER — Ambulatory Visit (HOSPITAL_COMMUNITY): Payer: Medicare Other

## 2016-12-01 ENCOUNTER — Encounter (HOSPITAL_COMMUNITY)
Admission: RE | Disposition: A | Discharge status: Home / Self Care | Payer: Self-pay | Source: Ambulatory Visit | Attending: Urology

## 2016-12-01 ENCOUNTER — Encounter (HOSPITAL_COMMUNITY): Payer: Self-pay

## 2016-12-01 DIAGNOSIS — Z87440 Personal history of urinary (tract) infections: Secondary | ICD-10-CM | POA: Insufficient documentation

## 2016-12-01 DIAGNOSIS — Z466 Encounter for fitting and adjustment of urinary device: Secondary | ICD-10-CM | POA: Diagnosis not present

## 2016-12-01 DIAGNOSIS — N131 Hydronephrosis with ureteral stricture, not elsewhere classified: Secondary | ICD-10-CM | POA: Diagnosis not present

## 2016-12-01 DIAGNOSIS — Z8669 Personal history of other diseases of the nervous system and sense organs: Secondary | ICD-10-CM | POA: Diagnosis not present

## 2016-12-01 DIAGNOSIS — K219 Gastro-esophageal reflux disease without esophagitis: Secondary | ICD-10-CM | POA: Diagnosis not present

## 2016-12-01 DIAGNOSIS — I10 Essential (primary) hypertension: Secondary | ICD-10-CM | POA: Diagnosis not present

## 2016-12-01 DIAGNOSIS — Z7901 Long term (current) use of anticoagulants: Secondary | ICD-10-CM | POA: Diagnosis not present

## 2016-12-01 DIAGNOSIS — C8510 Unspecified B-cell lymphoma, unspecified site: Secondary | ICD-10-CM | POA: Diagnosis not present

## 2016-12-01 DIAGNOSIS — Z9079 Acquired absence of other genital organ(s): Secondary | ICD-10-CM | POA: Diagnosis not present

## 2016-12-01 DIAGNOSIS — E119 Type 2 diabetes mellitus without complications: Secondary | ICD-10-CM | POA: Insufficient documentation

## 2016-12-01 DIAGNOSIS — Z833 Family history of diabetes mellitus: Secondary | ICD-10-CM | POA: Insufficient documentation

## 2016-12-01 DIAGNOSIS — Z794 Long term (current) use of insulin: Secondary | ICD-10-CM | POA: Insufficient documentation

## 2016-12-01 DIAGNOSIS — I2510 Atherosclerotic heart disease of native coronary artery without angina pectoris: Secondary | ICD-10-CM | POA: Diagnosis not present

## 2016-12-01 DIAGNOSIS — Z87891 Personal history of nicotine dependence: Secondary | ICD-10-CM | POA: Diagnosis not present

## 2016-12-01 DIAGNOSIS — C61 Malignant neoplasm of prostate: Secondary | ICD-10-CM | POA: Insufficient documentation

## 2016-12-01 DIAGNOSIS — M1990 Unspecified osteoarthritis, unspecified site: Secondary | ICD-10-CM | POA: Diagnosis not present

## 2016-12-01 DIAGNOSIS — Z9889 Other specified postprocedural states: Secondary | ICD-10-CM | POA: Diagnosis not present

## 2016-12-01 DIAGNOSIS — Z79899 Other long term (current) drug therapy: Secondary | ICD-10-CM | POA: Diagnosis not present

## 2016-12-01 DIAGNOSIS — E7800 Pure hypercholesterolemia, unspecified: Secondary | ICD-10-CM | POA: Insufficient documentation

## 2016-12-01 DIAGNOSIS — G4730 Sleep apnea, unspecified: Secondary | ICD-10-CM | POA: Diagnosis not present

## 2016-12-01 DIAGNOSIS — I495 Sick sinus syndrome: Secondary | ICD-10-CM | POA: Diagnosis not present

## 2016-12-01 DIAGNOSIS — N135 Crossing vessel and stricture of ureter without hydronephrosis: Secondary | ICD-10-CM | POA: Diagnosis not present

## 2016-12-01 HISTORY — PX: CYSTOSCOPY WITH STENT PLACEMENT: SHX5790

## 2016-12-01 LAB — GLUCOSE, CAPILLARY
GLUCOSE-CAPILLARY: 93 mg/dL (ref 65–99)
Glucose-Capillary: 113 mg/dL — ABNORMAL HIGH (ref 65–99)

## 2016-12-01 SURGERY — CYSTOSCOPY, WITH STENT INSERTION
Anesthesia: General | Laterality: Bilateral

## 2016-12-01 MED ORDER — LACTATED RINGERS IV SOLN
INTRAVENOUS | Status: DC | PRN
  Administered 2016-12-01 (×2): via INTRAVENOUS

## 2016-12-01 MED ORDER — FENTANYL CITRATE (PF) 100 MCG/2ML IJ SOLN
25.0000 ug | INTRAMUSCULAR | Status: DC | PRN
Start: 2016-12-01 — End: 2016-12-01

## 2016-12-01 MED ORDER — CIPROFLOXACIN IN D5W 400 MG/200ML IV SOLN
400.0000 mg | Freq: Two times a day (BID) | INTRAVENOUS | Status: DC
  Administered 2016-12-01: 400 mg via INTRAVENOUS

## 2016-12-01 MED ORDER — ONDANSETRON HCL 4 MG/2ML IJ SOLN
INTRAMUSCULAR | Status: DC | PRN
  Administered 2016-12-01: 4 mg via INTRAVENOUS

## 2016-12-01 MED ORDER — EPHEDRINE SULFATE-NACL 50-0.9 MG/10ML-% IV SOSY
PREFILLED_SYRINGE | INTRAVENOUS | Status: DC | PRN
  Administered 2016-12-01 (×2): 5 mg via INTRAVENOUS
  Administered 2016-12-01: 15 mg via INTRAVENOUS
  Administered 2016-12-01: 10 mg via INTRAVENOUS

## 2016-12-01 MED ORDER — 0.9 % SODIUM CHLORIDE (POUR BTL) OPTIME
TOPICAL | Status: DC | PRN
  Administered 2016-12-01: 1000 mL

## 2016-12-01 MED ORDER — CIPROFLOXACIN IN D5W 400 MG/200ML IV SOLN
INTRAVENOUS | Status: AC
  Filled 2016-12-01: qty 200

## 2016-12-01 MED ORDER — PROPOFOL 10 MG/ML IV BOLUS
INTRAVENOUS | Status: AC
  Filled 2016-12-01: qty 20

## 2016-12-01 MED ORDER — PROPOFOL 10 MG/ML IV BOLUS
INTRAVENOUS | Status: DC | PRN
  Administered 2016-12-01: 120 mg via INTRAVENOUS

## 2016-12-01 MED ORDER — EPHEDRINE 5 MG/ML INJ
INTRAVENOUS | Status: AC
  Filled 2016-12-01: qty 10

## 2016-12-01 MED ORDER — STERILE WATER FOR IRRIGATION IR SOLN
Status: DC | PRN
  Administered 2016-12-01: 600 mL

## 2016-12-01 MED ORDER — LIDOCAINE 2% (20 MG/ML) 5 ML SYRINGE
INTRAMUSCULAR | Status: DC | PRN
  Administered 2016-12-01: 60 mg via INTRAVENOUS

## 2016-12-01 MED ORDER — FENTANYL CITRATE (PF) 100 MCG/2ML IJ SOLN
INTRAMUSCULAR | Status: DC | PRN
  Administered 2016-12-01: 100 ug via INTRAVENOUS

## 2016-12-01 MED ORDER — FENTANYL CITRATE (PF) 100 MCG/2ML IJ SOLN
INTRAMUSCULAR | Status: AC
  Filled 2016-12-01: qty 2

## 2016-12-01 SURGICAL SUPPLY — 12 items
BAG URO CATCHER STRL LF (MISCELLANEOUS) ×3 IMPLANT
CATH INTERMIT  6FR 70CM (CATHETERS) ×3 IMPLANT
CLOTH BEACON ORANGE TIMEOUT ST (SAFETY) ×3 IMPLANT
COVER SURGICAL LIGHT HANDLE (MISCELLANEOUS) IMPLANT
GLOVE BIOGEL M STRL SZ7.5 (GLOVE) ×3 IMPLANT
GOWN STRL REUS W/TWL LRG LVL3 (GOWN DISPOSABLE) ×6 IMPLANT
GUIDEWIRE STR DUAL SENSOR (WIRE) ×3 IMPLANT
MANIFOLD NEPTUNE II (INSTRUMENTS) ×3 IMPLANT
PACK CYSTO (CUSTOM PROCEDURE TRAY) ×3 IMPLANT
STENT URO INLAY 6FRX24CM (STENTS) ×6 IMPLANT
TUBING CONNECTING 10 (TUBING) ×2 IMPLANT
TUBING CONNECTING 10' (TUBING) ×1

## 2016-12-01 NOTE — Op Note (Signed)
  Preoperative diagnosis:  1. Bilateral ureteral obstruction 2. Metastatic prostate cancer   Postoperative diagnosis:  1. Bilateral ureteral obstruction 2. Metastatic prostate cancer   Procedure:  1. Cystoscopy 2. Bilateral ureteral stent placement (6 x 24 Bard Inlay Optima)  Surgeon: Roxy Horseman, Brooke Bonito. M.D.  Anesthesia: General  Complications: None  Intraoperative findings: Bilateral indwelling stents were moderately encrusted.  EBL: Minimal  Specimens: None  Indication: [name] is a 83 y.o. patient with bilateral ureteral obstruction. After reviewing the management options for treatment, he elected to proceed with the above surgical procedure(s). We have discussed the potential benefits and risks of the procedure, side effects of the proposed treatment, the likelihood of the patient achieving the goals of the procedure, and any potential problems that might occur during the procedure or recuperation. Informed consent has been obtained.  Description of procedure:  The patient was taken to the operating room and general anesthesia was induced.  The patient was placed in the dorsal lithotomy position, prepped and draped in the usual sterile fashion, and preoperative antibiotics were administered. A preoperative time-out was performed.   Cystourethroscopy was performed.  The patient's urethra was examined and was unremarkable. The bladder was then systematically examined in its entirety. There was no evidence for any bladder tumors, stones, or other mucosal pathology.  The indwelling stents were identified.  Attention then turned to the right ureteral orifice and the patient's indwelling ureteral stent was identified and brought out to the urethral meatus with the flexible graspers.  A 0.38 sensor guidewire was then advanced up the right ureter into the renal pelvis under fluoroscopic guidance.  The wire was then backloaded through the cystoscope and a ureteral stent was  advance over the wire using Seldinger technique.  The stent was positioned appropriately under fluoroscopic and cystoscopic guidance.  The wire was then removed with an adequate stent curl noted in the renal pelvis as well as in the bladder.  Attention then turned to the left ureteral orifice and the patient's indwelling ureteral stent was identified and brought out to the urethral meatus with the flexible graspers.  A 0.38 sensor guidewire was then advanced up the left ureter into the renal pelvis under fluoroscopic guidance.  The wire was then backloaded through the cystoscope and a ureteral stent was advance over the wire using Seldinger technique.  The stent was positioned appropriately under fluoroscopic and cystoscopic guidance.  The wire was then removed with an adequate stent curl noted in the renal pelvis as well as in the bladder.  The bladder was then emptied and the procedure ended.  The patient appeared to tolerate the procedure well and without complications.  The patient was able to be awakened and transferred to the recovery unit in satisfactory condition.    Pryor Curia MD

## 2016-12-01 NOTE — Discharge Instructions (Addendum)
1. You may see some blood in the urine and may have some burning with urination for 48-72 hours. You also may notice that you have to urinate more frequently or urgently after your procedure which is normal.  2. You should call should you develop an inability urinate, fever > 101, persistent nausea and vomiting that prevents you from eating or drinking to stay hydrated.  3. If you have a stent, you will likely urinate more frequently and urgently until the stent is removed and you may experience some discomfort/pain in the lower abdomen and flank especially when urinating. You may take pain medication prescribed to you if needed for pain. You may also intermittently have blood in the urine until the stent is removed.  General Anesthesia, Adult, Care After These instructions provide you with information about caring for yourself after your procedure. Your health care provider may also give you more specific instructions. Your treatment has been planned according to current medical practices, but problems sometimes occur. Call your health care provider if you have any problems or questions after your procedure. What can I expect after the procedure? After the procedure, it is common to have:  Vomiting.  A sore throat.  Mental slowness.  It is common to feel:  Nauseous.  Cold or shivery.  Sleepy.  Tired.  Sore or achy, even in parts of your body where you did not have surgery.  Follow these instructions at home: For at least 24 hours after the procedure:  Do not: ? Participate in activities where you could fall or become injured. ? Drive. ? Use heavy machinery. ? Drink alcohol. ? Take sleeping pills or medicines that cause drowsiness. ? Make important decisions or sign legal documents. ? Take care of children on your own.  Rest. Eating and drinking  If you vomit, drink water, juice, or soup when you can drink without vomiting.  Drink enough fluid to keep your urine clear or  pale yellow.  Make sure you have little or no nausea before eating solid foods.  Follow the diet recommended by your health care provider. General instructions  Have a responsible adult stay with you until you are awake and alert.  Return to your normal activities as told by your health care provider. Ask your health care provider what activities are safe for you.  Take over-the-counter and prescription medicines only as told by your health care provider.  If you smoke, do not smoke without supervision.  Keep all follow-up visits as told by your health care provider. This is important. Contact a health care provider if:  You continue to have nausea or vomiting at home, and medicines are not helpful.  You cannot drink fluids or start eating again.  You cannot urinate after 8-12 hours.  You develop a skin rash.  You have fever.  You have increasing redness at the site of your procedure. Get help right away if:  You have difficulty breathing.  You have chest pain.  You have unexpected bleeding.  You feel that you are having a life-threatening or urgent problem. This information is not intended to replace advice given to you by your health care provider. Make sure you discuss any questions you have with your health care provider. Document Released: 07/18/2000 Document Revised: 09/14/2015 Document Reviewed: 03/26/2015 Elsevier Interactive Patient Education  2018 [place].   Ureteral Stent Implantation, Care After Refer to this sheet in the next few weeks. These instructions provide you with information about caring for yourself  after your procedure. Your health care provider may also give you more specific instructions. Your treatment has been planned according to current medical practices, but problems sometimes occur. Call your health care provider if you have any problems or questions after your procedure. What can I expect after the procedure? After the procedure, it  is common to have:  Nausea.  Mild pain when you urinate. You may feel this pain in your lower back or lower abdomen. Pain should stop within a few minutes after you urinate. This may last for up to 1 week.  A small amount of blood in your urine for several days.  Follow these instructions at home:  Medicines  Take over-the-counter and prescription medicines only as told by your health care provider.  If you were prescribed an antibiotic medicine, take it as told by your health care provider. Do not stop taking the antibiotic even if you start to feel better.  Do not drive for 24 hours if you received a sedative.  Do not drive or operate heavy machinery while taking prescription pain medicines. Activity  Return to your normal activities as told by your health care provider. Ask your health care provider what activities are safe for you.  Do not lift anything that is heavier than 10 lb (4.5 kg). Follow this limit for 1 week after your procedure, or for as long as told by your health care provider. General instructions  Watch for any blood in your urine. Call your health care provider if the amount of blood in your urine increases.  If you have a catheter: ? Follow instructions from your health care provider about taking care of your catheter and collection bag. ? Do not take baths, swim, or use a hot tub until your health care provider approves.  Drink enough fluid to keep your urine clear or pale yellow.  Keep all follow-up visits as told by your health care provider. This is important. Contact a health care provider if:  You have pain that gets worse or does not get better with medicine, especially pain when you urinate.  You have difficulty urinating.  You feel nauseous or you vomit repeatedly during a period of more than 2 days after the procedure. Get help right away if:  Your urine is dark red or has blood clots in it.  You are leaking urine (have  incontinence).  The end of the stent comes out of your urethra.  You cannot urinate.  You have sudden, sharp, or severe pain in your abdomen or lower back.  You have a fever. This information is not intended to replace advice given to you by your health care provider. Make sure you discuss any questions you have with your health care provider. Document Released: 12/12/2012 Document Revised: 09/17/2015 Document Reviewed: 10/24/2014 Elsevier Interactive Patient Education  2018 [employer].  Cystoscopy, Care After Refer to this sheet in the next few weeks. These instructions provide you with information about caring for yourself after your procedure. Your health care provider may also give you more specific instructions. Your treatment has been planned according to current medical practices, but problems sometimes occur. Call your health care provider if you have any problems or questions after your procedure. What can I expect after the procedure? After the procedure, it is common to have:  Mild pain when you urinate. Pain should stop within a few minutes after you urinate. This may last for up to 1 week.  A small amount of blood  in your urine for several days.  Feeling like you need to urinate but producing only a small amount of urine.  Follow these instructions at home:  Medicines  Take over-the-counter and prescription medicines only as told by your health care provider.  If you were prescribed an antibiotic medicine, take it as told by your health care provider. Do not stop taking the antibiotic even if you start to feel better. General instructions   Return to your normal activities as told by your health care provider. Ask your health care provider what activities are safe for you.  Do not drive for 24 hours if you received a sedative.  Watch for any blood in your urine. If the amount of blood in your urine increases, call your health care provider.  Follow instructions  from your health care provider about eating or drinking restrictions.  If a tissue sample was removed for testing (biopsy) during your procedure, it is your responsibility to get your test results. Ask your health care provider or the department performing the test when your results will be ready.  Drink enough fluid to keep your urine clear or pale yellow.  Keep all follow-up visits as told by your health care provider. This is important. Contact a health care provider if:  You have pain that gets worse or does not get better with medicine, especially pain when you urinate.  You have difficulty urinating. Get help right away if:  You have more blood in your urine.  You have blood clots in your urine.  You have abdominal pain.  You have a fever or chills.  You are unable to urinate. This information is not intended to replace advice given to you by your health care provider. Make sure you discuss any questions you have with your health care provider. Document Released: 10/29/2004 Document Revised: 09/17/2015 Document Reviewed: 02/26/2015 Elsevier Interactive Patient Education  2017 [employer].

## 2016-12-01 NOTE — Anesthesia Procedure Notes (Signed)
Procedure Name: LMA Insertion Performed by: Gean Maidens Pre-anesthesia Checklist: Patient identified, Emergency Drugs available, Suction available, Patient being monitored and Timeout performed Patient Re-evaluated:Patient Re-evaluated prior to induction Oxygen Delivery Method: Circle system utilized Preoxygenation: Pre-oxygenation with 100% oxygen Induction Type: IV induction Ventilation: Mask ventilation without difficulty LMA: LMA inserted LMA Size: 4.0 Number of attempts: 1 Placement Confirmation: positive ETCO2,  CO2 detector and breath sounds checked- equal and bilateral Tube secured with: Tape Dental Injury: Teeth and Oropharynx as per pre-operative assessment

## 2016-12-01 NOTE — Anesthesia Postprocedure Evaluation (Signed)
Anesthesia Post Note  Patient: [name]  Procedure(s) Performed: Procedure(s) (LRB): CYSTOSCOPY WITH STENT EXCHANGE (Bilateral)     Patient location during evaluation: PACU Anesthesia Type: General Level of consciousness: awake Pain management: pain level controlled Vital Signs Assessment: post-procedure vital signs reviewed and stable Respiratory status: spontaneous breathing Cardiovascular status: stable Anesthetic complications: no    Last Vitals:  Vitals:   12/01/16 0845 12/01/16 0900  BP: (!) 111/59 (!) 110/54  Pulse: (!) 59 (!) 58  Resp: (!) 8 12  Temp: (!) 36.4 C (!) 36.3 C  SpO2: 98% 98%    Last Pain:  Vitals:   12/01/16 0845  TempSrc:   PainSc: 0-No pain                 Ayanni Tun

## 2016-12-01 NOTE — Transfer of Care (Signed)
Immediate Anesthesia Transfer of Care Note  Patient: [name]  Procedure(s) Performed: Procedure(s): CYSTOSCOPY WITH STENT EXCHANGE (Bilateral)  Patient Location: PACU  Anesthesia Type:General  Level of Consciousness: sedated, patient cooperative and responds to stimulation  Airway & Oxygen Therapy: Patient Spontanous Breathing and Patient connected to face mask oxygen  Post-op Assessment: Report given to RN and Post -op Vital signs reviewed and stable  Post vital signs: Reviewed and stable  Last Vitals:  Vitals:   12/01/16 0524  BP: 113/65  Pulse: 62  Resp: 18  Temp: 36.7 C    Last Pain:  Vitals:   12/01/16 0542  TempSrc:   PainSc: 0-No pain         Complications: No apparent anesthesia complications

## 2016-12-01 NOTE — Anesthesia Preprocedure Evaluation (Signed)
Anesthesia Evaluation  Patient identified by MRN, date of birth, ID band Patient awake    Reviewed: Allergy & Precautions, NPO status , Patient's Chart, lab work & pertinent test results  Airway Mallampati: II  TM Distance: >3 FB     Dental   Pulmonary shortness of breath, sleep apnea , former smoker,    breath sounds clear to auscultation       Cardiovascular hypertension, + CAD   Rhythm:Regular Rate:Normal     Neuro/Psych    GI/Hepatic Neg liver ROS, GERD  ,  Endo/Other  diabetes  Renal/GU Renal disease     Musculoskeletal   Abdominal   Peds  Hematology  (+) anemia ,   Anesthesia Other Findings   Reproductive/Obstetrics                             Anesthesia Physical Anesthesia Plan  ASA: III  Anesthesia Plan: General   Post-op Pain Management:    Induction: Intravenous  PONV Risk Score and Plan: 2 and Ondansetron, Dexamethasone, Treatment may vary due to age or medical condition and Propofol infusion  Airway Management Planned: LMA  Additional Equipment:   Intra-op Plan:   Post-operative Plan: Extubation in OR  Informed Consent: I have reviewed the patients History and Physical, chart, labs and discussed the procedure including the risks, benefits and alternatives for the proposed anesthesia with the patient or authorized representative who has indicated his/her understanding and acceptance.   Dental advisory given  Plan Discussed with: CRNA, Anesthesiologist and Surgeon  Anesthesia Plan Comments:         Anesthesia Quick Evaluation

## 2016-12-01 NOTE — Interval H&P Note (Signed)
History and Physical Interval Note:  12/01/2016 6:49 AM  [name]  has presented today for surgery, with the diagnosis of PROSTATE CANCER, BILATERAL URETERAL OBSTRUCTION  The various methods of treatment have been discussed with the patient and family. After consideration of risks, benefits and other options for treatment, the patient has consented to  Procedure(s): CYSTOSCOPY WITH STENT PLACEMENT (Bilateral) as a surgical intervention .  The patient's history has been reviewed, patient examined, no change in status, stable for surgery.  I have reviewed the patient's chart and labs.  Questions were answered to the patient's satisfaction.     Tyrhonda Georgiades,LES

## 2016-12-02 ENCOUNTER — Encounter (HOSPITAL_COMMUNITY): Payer: Self-pay | Admitting: Urology

## 2016-12-29 ENCOUNTER — Other Ambulatory Visit: Payer: Medicare Other

## 2016-12-29 ENCOUNTER — Ambulatory Visit: Payer: Medicare Other | Admitting: Oncology

## 2016-12-30 ENCOUNTER — Ambulatory Visit: Payer: Medicare Other

## 2017-01-05 ENCOUNTER — Ambulatory Visit (HOSPITAL_BASED_OUTPATIENT_CLINIC_OR_DEPARTMENT_OTHER): Payer: Medicare Other | Admitting: Oncology

## 2017-01-05 ENCOUNTER — Telehealth: Payer: Self-pay | Admitting: Oncology

## 2017-01-05 ENCOUNTER — Other Ambulatory Visit (HOSPITAL_BASED_OUTPATIENT_CLINIC_OR_DEPARTMENT_OTHER): Payer: Medicare Other

## 2017-01-05 ENCOUNTER — Encounter: Payer: Self-pay | Admitting: Oncology

## 2017-01-05 ENCOUNTER — Ambulatory Visit (HOSPITAL_BASED_OUTPATIENT_CLINIC_OR_DEPARTMENT_OTHER): Payer: Medicare Other

## 2017-01-05 ENCOUNTER — Ambulatory Visit: Payer: Medicare Other

## 2017-01-05 VITALS — BP 114/64 | HR 64 | Temp 97.9°F | Resp 17 | Ht 68.0 in | Wt 169.1 lb

## 2017-01-05 VITALS — BP 140/64 | HR 72 | Temp 98.0°F | Resp 18

## 2017-01-05 DIAGNOSIS — N183 Chronic kidney disease, stage 3 unspecified: Secondary | ICD-10-CM

## 2017-01-05 DIAGNOSIS — C8333 Diffuse large B-cell lymphoma, intra-abdominal lymph nodes: Secondary | ICD-10-CM

## 2017-01-05 DIAGNOSIS — R197 Diarrhea, unspecified: Secondary | ICD-10-CM | POA: Diagnosis not present

## 2017-01-05 DIAGNOSIS — C8219 Follicular lymphoma grade II, extranodal and solid organ sites: Secondary | ICD-10-CM

## 2017-01-05 DIAGNOSIS — Z5112 Encounter for antineoplastic immunotherapy: Secondary | ICD-10-CM

## 2017-01-05 DIAGNOSIS — D631 Anemia in chronic kidney disease: Secondary | ICD-10-CM

## 2017-01-05 DIAGNOSIS — Z95828 Presence of other vascular implants and grafts: Secondary | ICD-10-CM

## 2017-01-05 DIAGNOSIS — C8580 Other specified types of non-Hodgkin lymphoma, unspecified site: Secondary | ICD-10-CM

## 2017-01-05 DIAGNOSIS — C61 Malignant neoplasm of prostate: Secondary | ICD-10-CM

## 2017-01-05 DIAGNOSIS — C8331 Diffuse large B-cell lymphoma, lymph nodes of head, face, and neck: Secondary | ICD-10-CM

## 2017-01-05 DIAGNOSIS — I4891 Unspecified atrial fibrillation: Secondary | ICD-10-CM

## 2017-01-05 DIAGNOSIS — R634 Abnormal weight loss: Secondary | ICD-10-CM | POA: Diagnosis not present

## 2017-01-05 DIAGNOSIS — C8203 Follicular lymphoma grade I, intra-abdominal lymph nodes: Secondary | ICD-10-CM

## 2017-01-05 DIAGNOSIS — C8590 Non-Hodgkin lymphoma, unspecified, unspecified site: Secondary | ICD-10-CM

## 2017-01-05 DIAGNOSIS — D630 Anemia in neoplastic disease: Secondary | ICD-10-CM

## 2017-01-05 LAB — CBC WITH DIFFERENTIAL/PLATELET
BASO%: 0.7 % (ref 0.0–2.0)
BASOS ABS: 0 10*3/uL (ref 0.0–0.1)
EOS%: 5 % (ref 0.0–7.0)
Eosinophils Absolute: 0.3 10*3/uL (ref 0.0–0.5)
HEMATOCRIT: 35.3 % — AB (ref 38.4–49.9)
HEMOGLOBIN: 11.6 g/dL — AB (ref 13.0–17.1)
LYMPH#: 0.3 10*3/uL — AB (ref 0.9–3.3)
LYMPH%: 5.9 % — ABNORMAL LOW (ref 14.0–49.0)
MCH: 28 pg (ref 27.2–33.4)
MCHC: 32.9 g/dL (ref 32.0–36.0)
MCV: 85.3 fL (ref 79.3–98.0)
MONO#: 0.4 10*3/uL (ref 0.1–0.9)
MONO%: 7.7 % (ref 0.0–14.0)
NEUT%: 80.7 % — ABNORMAL HIGH (ref 39.0–75.0)
NEUTROS ABS: 4.2 10*3/uL (ref 1.5–6.5)
Platelets: 161 10*3/uL (ref 140–400)
RBC: 4.14 10*6/uL — ABNORMAL LOW (ref 4.20–5.82)
RDW: 15.7 % — AB (ref 11.0–14.6)
WBC: 5.2 10*3/uL (ref 4.0–10.3)

## 2017-01-05 LAB — COMPREHENSIVE METABOLIC PANEL
ALBUMIN: 4.2 g/dL (ref 3.5–5.0)
ALK PHOS: 55 U/L (ref 40–150)
ALT: 25 U/L (ref 0–55)
AST: 34 U/L (ref 5–34)
Anion Gap: 12 mEq/L — ABNORMAL HIGH (ref 3–11)
BUN: 34.4 mg/dL — AB (ref 7.0–26.0)
CALCIUM: 10.5 mg/dL — AB (ref 8.4–10.4)
CO2: 21 mEq/L — ABNORMAL LOW (ref 22–29)
CREATININE: 1.7 mg/dL — AB (ref 0.7–1.3)
Chloride: 106 mEq/L (ref 98–109)
EGFR: 36 mL/min/{1.73_m2} — ABNORMAL LOW (ref >90)
GLUCOSE: 123 mg/dL (ref 70–140)
POTASSIUM: 4.6 meq/L (ref 3.5–5.1)
SODIUM: 138 meq/L (ref 136–145)
Total Bilirubin: 0.62 mg/dL (ref 0.20–1.20)
Total Protein: 7.1 g/dL (ref 6.4–8.3)

## 2017-01-05 LAB — LACTATE DEHYDROGENASE: LDH: 132 U/L (ref 125–245)

## 2017-01-05 MED ORDER — DIPHENHYDRAMINE HCL 25 MG PO CAPS
ORAL_CAPSULE | ORAL | Status: AC
  Filled 2017-01-05: qty 1

## 2017-01-05 MED ORDER — SODIUM CHLORIDE 0.9 % IV SOLN
350.0000 mg/m2 | Freq: Once | INTRAVENOUS | Status: AC
  Administered 2017-01-05: 700 mg via INTRAVENOUS
  Filled 2017-01-05: qty 50

## 2017-01-05 MED ORDER — DIPHENHYDRAMINE HCL 25 MG PO CAPS
25.0000 mg | ORAL_CAPSULE | Freq: Once | ORAL | Status: AC
  Administered 2017-01-05: 25 mg via ORAL

## 2017-01-05 MED ORDER — HEPARIN SOD (PORK) LOCK FLUSH 100 UNIT/ML IV SOLN
500.0000 [IU] | Freq: Once | INTRAVENOUS | Status: AC | PRN
  Administered 2017-01-05: 500 [IU]
  Filled 2017-01-05: qty 5

## 2017-01-05 MED ORDER — SODIUM CHLORIDE 0.9 % IJ SOLN
10.0000 mL | INTRAMUSCULAR | Status: AC | PRN
Start: 2017-01-05 — End: ?
  Administered 2017-01-05: 10 mL
  Filled 2017-01-05: qty 10

## 2017-01-05 MED ORDER — SODIUM CHLORIDE 0.9 % IV SOLN
Freq: Once | INTRAVENOUS | Status: AC
  Administered 2017-01-05: 10:00:00 via INTRAVENOUS

## 2017-01-05 MED ORDER — ACETAMINOPHEN 325 MG PO TABS
650.0000 mg | ORAL_TABLET | Freq: Once | ORAL | Status: AC
  Administered 2017-01-05: 650 mg via ORAL

## 2017-01-05 MED ORDER — SODIUM CHLORIDE 0.9 % IJ SOLN
10.0000 mL | INTRAMUSCULAR | Status: DC | PRN
  Administered 2017-01-05: 10 mL via INTRAVENOUS
  Filled 2017-01-05: qty 10

## 2017-01-05 MED ORDER — ACETAMINOPHEN 325 MG PO TABS
ORAL_TABLET | ORAL | Status: AC
  Filled 2017-01-05: qty 2

## 2017-01-05 NOTE — Telephone Encounter (Signed)
Gave patient avs and calendar with appts.  °

## 2017-01-05 NOTE — Patient Instructions (Signed)

## 2017-01-05 NOTE — Progress Notes (Signed)
ID: [name] OB: 1931/11/18  MR#: [id number]  ION#:[id number]  PCP: [name], MD GYN:   SU: [name] OTHER MD: [name], [name],  [name], [name], [name]  CHIEF COMPLAINT: Non-Hodgkin's lymphoma, stage IV prostate cancer  CURRENT TREATMENT: Maintenance rituximab  NON-HODGKIN"S LYMPHOMA HISTORY: From the prior summary:  The patient developed right upper quadrant pain last year and he had an ultrasound April 5th which showed some gallstones without evidence of cholecystitis or ductal dilatation.  However, there were multiple lesions in the liver which could not be assessed further.  Accordingly on July 31, 2003, [name], showed multiple liver masses, more than 25, most over 1 cm, the largest being in the inferior right lobe, measuring 4.2 cm. There was also a small mass in the spleen.  CT of the pelvis was unremarkable.   The patient had a biopsy of the liver August 01, 2003.  The report says only that it was "a lesion in the right lobe of the liver".  Presumably this was the largest lesion present.  The final pathology [id number] and [phone]) showed only cirrhosis.     The patient has been followed by [name], and a repeat CT scan of the abdomen and pelvis was obtained May 18, 2004.  Many of the liver lesions seen previously had actually decreased in size.  However, [name], [name], a second ultrasound-guided biopsy was performed 06/11/04. This was a lesion deep in the left lobe of the liver and therefore, I would think not the same one previously biopsied which was in  the right liver. The pathology this time [id number]) shows a poorly differentiated neuroendocrine carcinoma which was positive for chromogranin A, negative for synaptophysin, thyroid transcription factor, a variety of cytokeratins, [name], alpha-fetoprotein and COX-2."  [name] was subsequently evaluated at [facility] by Dr. Otelia Limes and repeat liver biopsy and review of the earlier biopsy here showed a primary hepatic lymphoma. The patient was treated with R-CHOP as detailed below and achieved a complete response. He received maintenance rituximab until 2008  His subsequent history is as detailed below  INTERVAL HISTORY. [name] returns today for follow-up of his large cell non-Hodgkin's lymphoma, ccompanied by one of his daughters. The interval history is generally unremarkable. He continues to work in his garden, and when he gets a little tired and goes indoors. He tells me he has an excellent appetite, normal sense of taste, and he eats rather calorie heavy foods. He does have loose bowel movements about 3 times a day: Every time he eats he says he has to go and there is some urgency. There have been no fevers, no drenching sweats, and he has seen no blood in his stool. He does have stomach cramps at times. He has problems with sinuses, dentures, hearing loss, pain when walking, easy bruising, and of course his blood Sugar.  The daughter tells me his PSAis doing terrific and that his prostate cancer Dr. Is not only giving him Lupron every 6 months.  [name] had stent changes in August 2018, with  no complications.  REVIEW OF SYSTEMS:  [name] reports no "B" symptoms other than the unexplained weight loss a detailed review of systems today was otherwise stable  PAST MEDICAL HISTORY: Past Medical History:  Diagnosis Date  . A-fib ([place])   . Anemia   . Arthritis   . Atrial fibrillation ([place])   . Cancer of liver ([place])   . Diabetes mellitus    INSULIN DEPENDENT  . GERD (gastroesophageal reflux disease)    . HOH (hard of hearing)   . HX, PERSONAL, MALIGNANCY, PROSTATE 07/28/2006   Annotation: 2001, resected Qualifier: Diagnosis of  By: [name], [name]    . Hypertension   . Kidney stone   . Lymphoma ([place])   . Memory deficit 10/18/2013  . Near syncope 10/18/2013  . Neuropathy in diabetes ([place])    Hx: of  . Prostate cancer ([place])   . [name]    with exertion   . Skin cancer   . Sleep apnea    on CPAP - has not used in a long time   Significant for prostate cancer, the patient undergoing prostatectomy January 2001 under [facility] for a Gleason 7, pathologic T3b (positive seminal vesicle involvement) adenocarcinoma with 0 of 2 lymph nodes involved.  Current PSA is followed by Dr. [name]. Other medical problems include sleep apnea, minor coronary artery disease, hypertension, hypertriglyceridemia, cholelithiasis, colon polyps, cirrhosis by biopsy, diabetes, [name], history of nephrolithiasis, status post right renal surgery and history of left rotator cuff repair under [name].    PAST SURGICAL HISTORY: Past Surgical History:  Procedure Laterality Date  . ANKLE SURGERY    . CARDIAC CATHETERIZATION  09/16/96   Normal LV systolic function,dense ca+ prox. portion of the LAD w/50% narrowing in the distal portion, 30-40% irreg. in the proximal portion & 80% narrowing in the ostial portion of the posterolateral branch.  . CHOLECYSTECTOMY  04/04/2013  . CHOLECYSTECTOMY N/A 04/04/2013   Procedure: LAPAROSCOPIC CHOLECYSTECTOMY WITH INTRAOPERATIVE CHOLANGIOGRAM;  [name], MD;  Location: [place];  Service: General;  [name];  . COLONOSCOPY     Hx: of  . CYSTOSCOPY W/ URETERAL STENT PLACEMENT Bilateral 06/01/2015   Procedure: CYSTOSCOPY WITH BILATERAL STENT REPLACEMENT;  Surgeon: Raynelle Bring, MD;  Location: [facility];  Service: Urology;  Laterality: Bilateral;  . CYSTOSCOPY W/ URETERAL STENT PLACEMENT  Bilateral 10/29/2015   Procedure: CYSTOSCOPY WITH BILATERAL STENT REPLACEMENT;  Surgeon: Raynelle Bring, MD;  Location: [facility];  Service: Urology;  Laterality: Bilateral;  . CYSTOSCOPY W/ URETERAL STENT PLACEMENT Bilateral 05/26/2016   Procedure: CYSTO URETEROSCOPY  WITH BILATERAL  STENT REPLACEMENT;  Surgeon: Raynelle Bring, MD;  Location: [facility];  Service: Urology;  Laterality: Bilateral;  . CYSTOSCOPY WITH STENT PLACEMENT Bilateral 02/05/2015   Procedure: CYSTOSCOPY RETROGRADE AND BILATERAL  STENT PLACEMENT;  Surgeon: Kathie Rhodes, MD;  Location[facility];  Service: Urology;  Laterality: Bilateral;  . CYSTOSCOPY WITH STENT PLACEMENT Bilateral 12/01/2016   Procedure: CYSTOSCOPY WITH STENT EXCHANGE;  Surgeon: Raynelle Bring, MD;  Location[facility];  Service: Urology;  Laterality: Bilateral;  . INFUSION PORT  04/04/2013   RIGHT SUBCLAVIAN  . KIDNEY STONE SURGERY    . MOUTH SURGERY  10/19/2015   left upper teeth removed along with palate abscess   . PORTACATH PLACEMENT N/A 04/04/2013   Procedure: INSERTION PORT-A-CATH;  [name], MD;  Location: [place];  Service: General;  [name];  .  PROSTATECTOMY    . ROTATOR CUFF REPAIR    . TRANSURETHRAL RESECTION OF BLADDER TUMOR WITH GYRUS (TURBT-GYRUS) N/A 02/05/2015   Procedure: TRANSURETHRAL RESECTION OF BLADDER TUMOR  ;  Surgeon: Kathie Rhodes, MD;  Location[facility];  Service: Urology;  [name];    FAMILY HISTORY Family History  Problem Relation Age of Onset  . Heart disease Father   . Heart attack Father   . Cancer Sister   . Heart attack Brother   . Heart attack Brother   . Heart attack Brother   . Heart attack Brother   . Heart attack Brother   . Cancer Brother   . Cancer Brother   The patient's father died at the age of 12 from an MI.  The patient's mother died from "old age" at 49.  The patient is one of nine siblings.  One brother died from cancer of the esophagus, one sister with lymphoma and a half-brother with lung  cancer.  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, [name], a homemaker, died in 07/10/2012: [name] is an Scientist, physiological for [employer]; [name], [name]; and [name] is Environmental consultant.  Everybody lives in [place].  The patient has five grandchildren.  He is a member of [place].    ADVANCED DIRECTIVES: in place   HEALTH MAINTENANCE: Social History  Substance Use Topics  . Smoking status: Former Smoker    Types: Pipe, Landscape architect  . Smokeless tobacco: Former Systems developer    Types: Chew     Comment: [place] YEARS AGO "  . Alcohol use No     Colonoscopy:  PSA: Followed by Dr. [name]  Bone density:  Lipid panel:  Allergies  Allergen Reactions  . Atenolol Other (See Comments)    Slowed heart rate.   . Niacin Other (See Comments)    headaches    Current Outpatient Prescriptions  Medication Sig Dispense Refill  . leuprolide (LUPRON) 22.5 MG injection Inject 22.5 mg into the muscle every 3 (three) months.    Marland Kitchen allopurinol (ZYLOPRIM) 300 MG tablet TAKE 1 TABLET EVERY DAY 90 tablet 3  . amLODipine (NORVASC) 5 MG tablet Take 2.5 mg by mouth daily.     Marland Kitchen atorvastatin (LIPITOR) 20 MG tablet Take 20 mg by mouth at bedtime.    . Calcium-Magnesium-Vitamin D (CALCIUM 1200+D3 PO) Take 1 tablet by mouth at bedtime.     . cetirizine (ZYRTEC) 10 MG tablet Take 10 mg by mouth daily.    . cholestyramine (QUESTRAN) 4 g packet Take 4 g by mouth 3 (three) times daily as needed (for diarrhea).     . Cyanocobalamin (VITAMIN B-12) 2500 MCG SUBL Place 2,500 mcg under the tongue daily.    . fenofibrate 160 MG tablet Take 160 mg by mouth at bedtime.    . gabapentin (NEURONTIN) 300 MG capsule Take 300 mg by mouth 3 (three) times  daily.     . imiquimod (ALDARA) 5 % cream Apply 1 application topically every other day.    . Insulin Glargine (TOUJEO MAX SOLOSTAR) 300 UNIT/ML SOPN Inject 64 Units into the skin daily.    Marland Kitchen latanoprost (XALATAN) 0.005 % ophthalmic solution 1 drop at bedtime. Place 1 drop into  affected eye once daily    . lidocaine-prilocaine (EMLA) cream Apply 1 application topically as needed. (Patient taking differently: Apply 1 application topically daily as needed (port numbing). ) 30 g 3  . magnesium oxide (MAG-OX) 400 (241.3 MG) MG tablet Take 400 tablets by mouth 2 (two) times daily.   4  . metFORMIN (GLUCOPHAGE) 1000 MG tablet Take 1,000 mg by mouth 2 (two) times daily with a meal.     . Multiple Vitamins-Minerals (MULTIVITAMIN WITH MINERALS) tablet Take 1 tablet by mouth daily.    . Rivaroxaban (XARELTO) 15 MG TABS tablet Take 15 mg by mouth at bedtime.     . sertraline (ZOLOFT) 100 MG tablet Take 100 mg by mouth daily.    . vitamin C (ASCORBIC ACID) 500 MG tablet Take 500 mg by mouth daily.     No current facility-administered medications for this visit.    Facility-Administered Medications Ordered in Other Visits  Medication Dose Route Frequency Provider Last Rate Last Dose  . sodium chloride 0.9 % injection 10 mL  10 mL Intracatheter PRN Vaiden Adames, [name], MD   10 mL at 09/09/16 1212    Objective: Older white man in no acute distress Vitals:   01/05/17 0827  BP: 114/64  Pulse: 64  Resp: 17  Temp: 97.9 F (36.6 C)  SpO2: 98%     Body mass index is 25.71 kg/m.     Filed Weights   01/05/17 0827  Weight: 169 lb 1.6 oz (76.7 kg)  we have weights from 10/28/2016, 200 pounds, and 10/17/2016, and 185 pounds.  ECOG FS:1 - Symptomatic but completely ambulatory  Sclerae unicteric, EOMs intact Oropharynx clear and moist No cervical or supraclavicular adenopathy Lungs no rales or rhonchi Heart regular rate and rhythm Abd soft, nontender, positive bowel sounds MSK no focal spinal  tenderness, no upper extremity lymphedema Neuro: nonfocal, well oriented, appropriate affect      LAB RESULTS:  CMP     Component Value Date/Time   NA 138 01/05/2017 0804   K 4.6 01/05/2017 0804   CL 106 11/24/2016 1433   CO2 21 (L) 01/05/2017 0804   GLUCOSE 123 01/05/2017 0804   BUN 34.4 (H) 01/05/2017 0804   CREATININE 1.7 (H) 01/05/2017 0804   CALCIUM 10.5 (H) 01/05/2017 0804   PROT 7.1 01/05/2017 0804   ALBUMIN 4.2 01/05/2017 0804   AST 34 01/05/2017 0804   ALT 25 01/05/2017 0804   ALKPHOS 55 01/05/2017 0804   BILITOT 0.62 01/05/2017 0804   GFRNONAA 34 (L) 11/24/2016 1433   GFRAA 39 (L) 11/24/2016 1433    No results found for: SPEP  Lab Results  Component Value Date   WBC 5.2 01/05/2017   NEUTROABS 4.2 01/05/2017   HGB 11.6 (L) 01/05/2017   HCT 35.3 (L) 01/05/2017   MCV 85.3 01/05/2017   PLT 161 01/05/2017      Chemistry      Component Value Date/Time   NA 138 01/05/2017 0804   K 4.6 01/05/2017 0804   CL 106 11/24/2016 1433   CO2 21 (L) 01/05/2017 0804   BUN 34.4 (H) 01/05/2017 0804   CREATININE 1.7 (H) 01/05/2017 0804      Component Value Date/Time   CALCIUM 10.5 (H) 01/05/2017 0804   ALKPHOS 55 01/05/2017 0804   AST 34 01/05/2017 0804   ALT 25 01/05/2017 0804   BILITOT 0.62 01/05/2017 0804      Urinalysis    Component Value Date/Time   COLORURINE YELLOW 02/04/2015 1649  APPEARANCEUR TURBID (A) 02/04/2015 1649   LABSPEC 1.020 04/24/2015 0846   PHURINE 6.0 04/24/2015 0846   PHURINE 5.5 02/04/2015 1649   GLUCOSEU 250 04/24/2015 0846   HGBUR Large 04/24/2015 0846   HGBUR LARGE (A) 02/04/2015 1649   BILIRUBINUR Negative 04/24/2015 0846   KETONESUR Negative 04/24/2015 0846   [place] 02/04/2015 1649   PROTEINUR 30 04/24/2015 0846   PROTEINUR 100 (A) 02/04/2015 1649   UROBILINOGEN 0.2 04/24/2015 0846   NITRITE Negative 04/24/2015 0846   NITRITE POSITIVE (A) 02/04/2015 1649   LEUKOCYTESUR Negative 04/24/2015 0846     STUDIES: Underwent recent bilateral ureteral stents changed without event  ASSESSMENT:  83 y.o. patient with a diagnosis of    #1 Primary hepatic non-Hodgkin's lymphoma established through liver biopsy February 2006, treated with Rituxan x9 and then CHOP x6.  All chemotherapy completed in 2007.  Maintenance Rituxan discontinued October 2008.     #2  Biopsy proven retroperitoneal recurrence documented November 2014. Bone marrow biopsy 04/01/2013 was negative   #3 status post  laparoscopic cholecystectomy and port placement on 04/04/2013.  #4 Cycle #1  RICE chemotherapy  completed on 05/02/2013.   #5 cycle #2 RICE chemotherapy  on 05/17/2013  #6 PET scan performed on 05/23/2013 revealed marked partial response to chemotherapy with a retroperitoneal mass decrease in metabolic activity from SUV of 13.2 to 6.3. Right adrenal mass size and metabolic activity is resolved when compared to the previous PET scan.   #7 Evaluated at [facility] by Dr. Tomasa Hosteller for autologous transplant on 05/27/2013 and was quoted a 3% chance of significant heart damage and possibly death from the treatment and a 50% chance of being alive 5 years from now after transplant (versus perhaps 2 years without). After much thought the patient and family have decided firmly they do not wish to proceed to stem cell transplant.  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 treated with vancomycin IV x14 days completed 07/02/2013, followed by doxycycline for 2 additional weeks  #10 started maintenance Rituxan May 2015 (1 dose every 8 weeks)  #11 A-fib, on rivaroxaban  #12 prostate cancer, stage IV-- per Dr [name]  (a) obstructive uropathy secondary to bladder mass (Gleason 9), s/p  L stenting 02/05/2015, exchanged PRN  #13 PET scan 04/29/2016 shows continuing evidence of response, with no significant abnormal hypermetabolic activity to indicate recurrent or  active disease  PLAN: [name] is now 3-1/2 years out from his last chemotherapy for his non-Hodgkin's lymphoma. He continues on maintenance rituximab with excellent tolerance.  The only concern is his rapid weight loss over the past 2 months. I don't have a simple explanation for this. He tells me has a good appetite and good sense of taste and he is eating normally. His exercise level is also unchanged.  He does have loose bowel movements about 3 times a day I'm going to obtain his C. Difficile to evaluate this. It could be that he has fat malabsorption and if the C. Difficile is negative we will try Creon tablets.  In any case we are continuing the rituximab every 2 months, with a dose today and 8 weeks from today. He will see me again in 8 weeks. He knows to call for any problems that may develop before that visit.    [name], MD   01/05/2017

## 2017-01-19 DIAGNOSIS — H401234 Low-tension glaucoma, bilateral, indeterminate stage: Secondary | ICD-10-CM | POA: Diagnosis not present

## 2017-01-30 DIAGNOSIS — E119 Type 2 diabetes mellitus without complications: Secondary | ICD-10-CM | POA: Diagnosis not present

## 2017-01-30 DIAGNOSIS — E1121 Type 2 diabetes mellitus with diabetic nephropathy: Secondary | ICD-10-CM | POA: Diagnosis not present

## 2017-01-30 DIAGNOSIS — E1140 Type 2 diabetes mellitus with diabetic neuropathy, unspecified: Secondary | ICD-10-CM | POA: Diagnosis not present

## 2017-02-03 DIAGNOSIS — R310 Gross hematuria: Secondary | ICD-10-CM | POA: Diagnosis not present

## 2017-02-03 DIAGNOSIS — M545 Low back pain: Secondary | ICD-10-CM | POA: Diagnosis not present

## 2017-02-13 ENCOUNTER — Encounter (HOSPITAL_COMMUNITY)
Admission: RE | Admit: 2017-02-13 | Discharge: 2017-02-13 | Disposition: A | Discharge status: Home / Self Care | Payer: Medicare Other | Source: Ambulatory Visit | Attending: Oncology | Admitting: Oncology

## 2017-02-13 ENCOUNTER — Other Ambulatory Visit: Payer: Self-pay | Admitting: Oncology

## 2017-02-13 ENCOUNTER — Other Ambulatory Visit: Payer: Self-pay

## 2017-02-13 DIAGNOSIS — C8331 Diffuse large B-cell lymphoma, lymph nodes of head, face, and neck: Secondary | ICD-10-CM | POA: Diagnosis not present

## 2017-02-13 DIAGNOSIS — R197 Diarrhea, unspecified: Secondary | ICD-10-CM | POA: Insufficient documentation

## 2017-02-13 DIAGNOSIS — C61 Malignant neoplasm of prostate: Secondary | ICD-10-CM | POA: Insufficient documentation

## 2017-02-13 DIAGNOSIS — C8590 Non-Hodgkin lymphoma, unspecified, unspecified site: Secondary | ICD-10-CM | POA: Diagnosis not present

## 2017-02-13 LAB — GLUCOSE, CAPILLARY: GLUCOSE-CAPILLARY: 111 mg/dL — AB (ref 65–99)

## 2017-02-13 MED ORDER — FLUDEOXYGLUCOSE F - 18 (FDG) INJECTION
8.3500 | Freq: Once | INTRAVENOUS | Status: AC | PRN
  Administered 2017-02-13: 8.35 via INTRAVENOUS

## 2017-02-13 MED ORDER — PANCRELIPASE (LIP-PROT-AMYL) 12000-38000 UNITS PO CPEP: 12000.0000 [IU] | ORAL_CAPSULE | Freq: Three times a day (TID) | ORAL | 6 refills | Status: DC

## 2017-02-16 DIAGNOSIS — Z85828 Personal history of other malignant neoplasm of skin: Secondary | ICD-10-CM | POA: Diagnosis not present

## 2017-02-16 DIAGNOSIS — L821 Other seborrheic keratosis: Secondary | ICD-10-CM | POA: Diagnosis not present

## 2017-02-16 DIAGNOSIS — L570 Actinic keratosis: Secondary | ICD-10-CM | POA: Diagnosis not present

## 2017-02-16 DIAGNOSIS — D692 Other nonthrombocytopenic purpura: Secondary | ICD-10-CM | POA: Diagnosis not present

## 2017-02-21 DIAGNOSIS — N390 Urinary tract infection, site not specified: Secondary | ICD-10-CM | POA: Diagnosis not present

## 2017-02-21 DIAGNOSIS — R319 Hematuria, unspecified: Secondary | ICD-10-CM | POA: Diagnosis not present

## 2017-02-21 DIAGNOSIS — E119 Type 2 diabetes mellitus without complications: Secondary | ICD-10-CM | POA: Diagnosis not present

## 2017-02-21 DIAGNOSIS — M549 Dorsalgia, unspecified: Secondary | ICD-10-CM | POA: Diagnosis not present

## 2017-02-24 DIAGNOSIS — Z23 Encounter for immunization: Secondary | ICD-10-CM | POA: Diagnosis not present

## 2017-02-24 DIAGNOSIS — E782 Mixed hyperlipidemia: Secondary | ICD-10-CM | POA: Diagnosis not present

## 2017-02-24 DIAGNOSIS — E119 Type 2 diabetes mellitus without complications: Secondary | ICD-10-CM | POA: Diagnosis not present

## 2017-02-24 DIAGNOSIS — I10 Essential (primary) hypertension: Secondary | ICD-10-CM | POA: Diagnosis not present

## 2017-02-28 ENCOUNTER — Other Ambulatory Visit: Payer: Self-pay | Admitting: Nurse Practitioner

## 2017-03-09 NOTE — Progress Notes (Signed)
[place]  [phone] [phone]    ID: [name] OB: [birth year]  MR#: [id number]  QMG#:[id number]  PCP: Michael Pretty, MD GYN:   SU: [name] OTHER MD: [name], [name],  [name], [name], [name]  CHIEF COMPLAINT: Non-Hodgkin's lymphoma, stage IV prostate cancer  CURRENT TREATMENT: Maintenance rituximab  NON-HODGKIN"S LYMPHOMA HISTORY: From the prior summary:  The patient developed right upper quadrant pain last year and he had an ultrasound April 5th which showed some gallstones without evidence of cholecystitis or ductal dilatation.  However, there were multiple lesions in the liver which could not be assessed further.  Accordingly on July 31, 2003, [name], showed multiple liver masses, more than 25, most over 1 cm, the largest being in the inferior right lobe, measuring 4.2 cm. There was also a small mass in the spleen.  CT of the pelvis was unremarkable.   The patient had a biopsy of the liver August 01, 2003.  The report says only that it was "a lesion in the right lobe of the liver".  Presumably this was the largest lesion present.  The final pathology [id number] and [phone]) showed only cirrhosis.     The patient has been followed by [name], and a repeat CT scan of the abdomen and pelvis was obtained May 18, 2004.  Many of the liver lesions seen previously had actually decreased in size.  However, [name], [name], a second ultrasound-guided biopsy was performed 06/11/04. This was a lesion deep in the left lobe of the liver  and therefore, I would think not the same one previously biopsied which was in the right liver. The pathology this time [id number]) shows a poorly differentiated neuroendocrine carcinoma which was positive for chromogranin A, negative for synaptophysin, thyroid transcription factor, a variety of cytokeratins, [name], alpha-fetoprotein and COX-2."  [name] was subsequently evaluated at [facility] by Dr. Otelia [name] and repeat liver biopsy and review of the earlier biopsy here showed a primary hepatic lymphoma. The patient was treated with R-CHOP as detailed below and achieved a complete response. He received maintenance rituximab until 2008  His subsequent history is as detailed below  INTERVAL HISTORY. [name] returns today for follow-up and maintenance treatment of his non-Hodgkin's lymphoma. He is accompanied by his daughters. He continues on rituximab every 8 weeks, which he tolerates well.  Since his last visit here [facility] had a restaging PET scan, on 02/13/2017, which showed no new enlarged or hypermetabolic adenopathy, and only low level FDG uptake associated with treated tumor in the right retrocrural region ([place] 2)--this shows a continuing metabolic response.  REVIEW OF SYSTEMS:  [name] is doing well and notes that he is feeling better and not going to the bathroom as often. The patient's daughter reports that he has hematuria, and it is worse in the morning and after lunch. It is bright red blood and is reported to be a lot. He denies unusual headaches, visual changes, nausea, vomiting, or dizziness. There has been no unusual cough, phlegm production, or pleurisy. This been  no change in bowel or bladder habits. He denies unexplained fatigue or unexplained weight loss, bleeding, rash, or fever. A detailed review of systems was otherwise entirely stable.   PAST MEDICAL HISTORY: Past Medical History:  Diagnosis Date  . A-fib ([place])   . Anemia   . Arthritis   . Atrial fibrillation ([place])   . Cancer  of liver ([place])   . Diabetes mellitus    INSULIN DEPENDENT  . GERD (gastroesophageal reflux disease)   . HOH (hard of hearing)   . HX, PERSONAL, MALIGNANCY, PROSTATE 07/28/2006   Annotation: 2001, resected Qualifier: Diagnosis of  By: Michael Sima MD, [name]    . Hypertension   . Kidney stone   . Lymphoma ([place])   . Memory deficit 10/18/2013  . Near syncope 10/18/2013  . Neuropathy in diabetes ([place])    Hx: of  . Prostate cancer ([place])   . [name]    with exertion   . Skin cancer   . Sleep apnea    on CPAP - has not used in a long time   Significant for prostate cancer, the patient undergoing prostatectomy January 2001 under [facility] for a Gleason 7, pathologic T3b (positive seminal vesicle involvement) adenocarcinoma with 0 of 2 lymph nodes involved.  Current PSA is followed by Dr. Alinda [name]. Other medical problems include sleep apnea, minor coronary artery disease, hypertension, hypertriglyceridemia, cholelithiasis, colon polyps, cirrhosis by biopsy, diabetes, squamous cell carcinomas of the skin removed by [name], history of nephrolithiasis, status post right renal surgery and history of left rotator cuff repair under [name].    PAST SURGICAL HISTORY: Past Surgical History:  Procedure Laterality Date  . ANKLE SURGERY    . CARDIAC CATHETERIZATION  09/16/96   Normal LV systolic function,dense ca+ prox. portion of the LAD w/50% narrowing in the distal portion, 30-40% irreg. in the proximal portion & 80% narrowing in the ostial portion of the posterolateral branch.  . CHOLECYSTECTOMY  04/04/2013  . CHOLECYSTECTOMY N/A 04/04/2013   Procedure: LAPAROSCOPIC CHOLECYSTECTOMY WITH INTRAOPERATIVE CHOLANGIOGRAM;  Surgeon: Michael Regal, MD;  Location: [place];  Service: General;  [name];  . COLONOSCOPY     Hx: of  . CYSTOSCOPY W/ URETERAL STENT PLACEMENT Bilateral 06/01/2015   Procedure: CYSTOSCOPY WITH BILATERAL STENT REPLACEMENT;  Surgeon: Michael Bring, MD;   Location: [facility];  Service: Urology;  Laterality: Bilateral;  . CYSTOSCOPY W/ URETERAL STENT PLACEMENT Bilateral 10/29/2015   Procedure: CYSTOSCOPY WITH BILATERAL STENT REPLACEMENT;  Surgeon: Michael Bring, MD;  Location: [facility];  Service: Urology;  Laterality: Bilateral;  . CYSTOSCOPY W/ URETERAL STENT PLACEMENT Bilateral 05/26/2016   Procedure: CYSTO URETEROSCOPY  WITH BILATERAL  STENT REPLACEMENT;  Surgeon: Michael Bring, MD;  Location: [facility];  Service: Urology;  Laterality: Bilateral;  . CYSTOSCOPY WITH STENT PLACEMENT Bilateral 02/05/2015   Procedure: CYSTOSCOPY RETROGRADE AND BILATERAL  STENT PLACEMENT;  Surgeon: Kathie Rhodes, MD;  Location[facility];  Service: Urology;  Laterality: Bilateral;  . CYSTOSCOPY WITH STENT PLACEMENT Bilateral 12/01/2016   Procedure: CYSTOSCOPY WITH STENT EXCHANGE;  Surgeon: Michael Bring, MD;  Location[facility];  Service: Urology;  Laterality: Bilateral;  . INFUSION PORT  04/04/2013   RIGHT SUBCLAVIAN  . KIDNEY STONE SURGERY    . MOUTH SURGERY  10/19/2015   left upper teeth removed along with palate abscess   . PORTACATH PLACEMENT N/A 04/04/2013   Procedure: INSERTION PORT-A-CATH;  Surgeon: Michael Regal, MD;  Location: [place];  Service: General;  [name];  .  PROSTATECTOMY    . ROTATOR CUFF REPAIR    . TRANSURETHRAL RESECTION OF BLADDER TUMOR WITH GYRUS (TURBT-GYRUS) N/A 02/05/2015   Procedure: TRANSURETHRAL RESECTION OF BLADDER TUMOR  ;  Surgeon: Kathie Rhodes, MD;  Location[facility];  Service: Urology;  [name];    FAMILY HISTORY Family History  Problem Relation Age of Onset  . Heart disease Father   . Heart attack Father   . Cancer Sister   . Heart attack Brother   . Heart attack Brother   . Heart attack Brother   . Heart attack Brother   . Heart attack Brother   . Cancer Brother   . Cancer Brother   The patient's father died at the age of 81 from an MI.  The patient's mother died from "old age" at 13.  The patient is one of nine  siblings.  One brother died from cancer of the esophagus, 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, [name], a homemaker, died in 07-08-2012: [name] is an Scientist, physiological for [employer]; [name], [name]; and [name] is Environmental consultant.  Everybody lives in [place].  The patient has five grandchildren.  He is a member of [place]. [place].    ADVANCED DIRECTIVES: in place   HEALTH MAINTENANCE: Social History   Tobacco Use  . Smoking status: Former Smoker    Types: Pipe, Landscape architect  . Smokeless tobacco: Former Systems developer    Types: Chew  . Tobacco comment: QUIT SMOKING MANY YEARS AGO "  Substance Use Topics  . Alcohol use: No  . Drug use: No     Colonoscopy:  PSA: Followed by Dr. Alinda [name]  Bone density:  Lipid panel:  Allergies  Allergen Reactions  . Atenolol Other (See Comments)    Slowed heart rate.   . Niacin Other (See Comments)    headaches    Current Outpatient Medications  Medication Sig Dispense Refill  . allopurinol (ZYLOPRIM) 300 MG tablet TAKE 1 TABLET EVERY DAY 90 tablet 3  . amLODipine (NORVASC) 5 MG tablet Take 2.5 mg by mouth daily.     Marland Kitchen atorvastatin (LIPITOR) 20 MG tablet Take 20 mg by mouth at bedtime.    . Calcium-Magnesium-Vitamin D (CALCIUM 1200+D3 PO) Take 1 tablet by mouth at bedtime.     . cetirizine (ZYRTEC) 10 MG tablet Take 10 mg by mouth daily.    . cholestyramine (QUESTRAN) 4 g packet Take 4 g by mouth 3 (three) times daily as needed (for diarrhea).     . Cyanocobalamin (VITAMIN B-12) 2500 MCG SUBL Place 2,500 mcg under the tongue daily.    . fenofibrate 160 MG tablet Take 160 mg by mouth at bedtime.    . gabapentin (NEURONTIN)  300 MG capsule Take 300 mg by mouth 3 (three) times daily.     . imiquimod (ALDARA) 5 % cream Apply 1 application topically every other day.    . Insulin Glargine (TOUJEO MAX SOLOSTAR) 300 UNIT/ML SOPN Inject 64 Units into the skin daily.    Marland Kitchen latanoprost (XALATAN) 0.005 % ophthalmic solution 1 drop at bedtime. Place 1 drop into affected eye once daily    . leuprolide (LUPRON) 22.5  MG injection Inject 22.5 mg into the muscle every 3 (three) months.    . lidocaine-prilocaine (EMLA) cream Apply 1 application topically as needed. (Patient taking differently: Apply 1 application topically daily as needed (port numbing). ) 30 g 3  . lipase/protease/amylase (CREON) 12000 units CPEP capsule Take 1 capsule (12,000 Units total) by mouth 3 (three) times daily before meals. 270 capsule 6  . magnesium oxide (MAG-OX) 400 (241.3 MG) MG tablet Take 400 tablets by mouth 2 (two) times daily.   4  . metFORMIN (GLUCOPHAGE) 1000 MG tablet Take 1,000 mg by mouth 2 (two) times daily with a meal.     . Multiple Vitamins-Minerals (MULTIVITAMIN WITH MINERALS) tablet Take 1 tablet by mouth daily.    . Rivaroxaban (XARELTO) 15 MG TABS tablet Take 15 mg by mouth at bedtime.     . sertraline (ZOLOFT) 100 MG tablet Take 100 mg by mouth daily.    . vitamin C (ASCORBIC ACID) 500 MG tablet Take 500 mg by mouth daily.     No current facility-administered medications for this visit.    Facility-Administered Medications Ordered in Other Visits  Medication Dose Route Frequency Provider Last Rate Last Dose  . sodium chloride 0.9 % injection 10 mL  10 mL Intracatheter PRN Freddi Forster, [name], MD   10 mL at 09/09/16 1212  . sodium chloride 0.9 % injection 10 mL  10 mL Intracatheter PRN Shell Blanchette, [name], MD   10 mL at 01/05/17 1313    Objective: Older white man who appears stated age 45:   03/15/17 1336  BP: 129/68  Pulse: 73  Resp: 20  Temp: 97.9 F (36.6 C)  SpO2: 99%     Body mass index is 26.82 kg/m.     Filed  Weights   03/15/17 1336  Weight: 176 lb 6.4 oz (80 kg)  we have weights from 10/28/2016, 200 pounds, and 10/17/2016, and 185 pounds.  ECOG FS:1 - Symptomatic but completely ambulatory  Sclerae unicteric, right medial corneal bleed Oropharynx clear and moist No cervical or supraclavicular adenopathy Lungs no rales or rhonchi Heart regular rate and rhythm Abd soft, nontender, positive bowel sounds MSK no focal spinal tenderness, no upper extremity lymphedema Neuro: nonfocal, well oriented, appropriate affect  LAB RESULTS:  CMP     Component Value Date/Time   NA 138 03/15/2017 1252   K 3.9 03/15/2017 1252   CL 106 11/24/2016 1433   CO2 23 03/15/2017 1252   GLUCOSE 137 03/15/2017 1252   BUN 21.1 03/15/2017 1252   CREATININE 1.4 (H) 03/15/2017 1252   CALCIUM 9.3 03/15/2017 1252   PROT 6.5 03/15/2017 1252   ALBUMIN 3.9 03/15/2017 1252   AST 30 03/15/2017 1252   ALT 31 03/15/2017 1252   ALKPHOS 63 03/15/2017 1252   BILITOT 0.33 03/15/2017 1252   GFRNONAA 34 (L) 11/24/2016 1433   GFRAA 39 (L) 11/24/2016 1433    No results found for: SPEP  Lab Results  Component Value Date   WBC 4.6 03/15/2017   NEUTROABS 3.6 03/15/2017   HGB 9.8 (L) 03/15/2017   HCT 29.7 (L) 03/15/2017   MCV 84.6 03/15/2017   PLT 114 (L) 03/15/2017      Chemistry      Component Value Date/Time   NA 138 03/15/2017 1252   K 3.9 03/15/2017 1252   CL 106 11/24/2016 1433   CO2 23 03/15/2017 1252   BUN 21.1 03/15/2017 1252   CREATININE 1.4 (H) 03/15/2017 1252  Component Value Date/Time   CALCIUM 9.3 03/15/2017 1252   ALKPHOS 63 03/15/2017 1252   AST 30 03/15/2017 1252   ALT 31 03/15/2017 1252   BILITOT 0.33 03/15/2017 1252      Urinalysis    Component Value Date/Time   COLORURINE YELLOW 02/04/2015 1649   APPEARANCEUR TURBID (A) 02/04/2015 1649   LABSPEC 1.020 04/24/2015 0846   PHURINE 6.0 04/24/2015 0846   PHURINE 5.5 02/04/2015 1649   GLUCOSEU 250 04/24/2015 0846   HGBUR Large  04/24/2015 0846   HGBUR LARGE (A) 02/04/2015 1649   BILIRUBINUR Negative 04/24/2015 0846   KETONESUR Negative 04/24/2015 0846   [place] 02/04/2015 1649   PROTEINUR 30 04/24/2015 0846   PROTEINUR 100 (A) 02/04/2015 1649   UROBILINOGEN 0.2 04/24/2015 0846   NITRITE Negative 04/24/2015 0846   NITRITE POSITIVE (A) 02/04/2015 1649   LEUKOCYTESUR Negative 04/24/2015 0846    STUDIES: PET, 02/13/2017, shows continuing complete metabolic response  ASSESSMENT:  83 y.o. patient with a diagnosis of    #1 Primary hepatic non-Hodgkin's lymphoma established through liver biopsy February 2006, treated with Rituxan x9 and then CHOP x6.  All chemotherapy completed in 2007.  Maintenance Rituxan discontinued October 2008.     #2  Biopsy proven retroperitoneal recurrence documented November 2014. Bone marrow biopsy 04/01/2013 was negative   #3 status post  laparoscopic cholecystectomy and port placement on 04/04/2013.  #4 Cycle #1  RICE chemotherapy  completed on 05/02/2013.   #5 cycle #2 RICE chemotherapy  on 05/17/2013  #6 PET scan performed on 05/23/2013 revealed marked partial response to chemotherapy with a retroperitoneal mass decrease in metabolic activity from SUV of 13.2 to 6.3. Right adrenal mass size and metabolic activity is resolved when compared to the previous PET scan.   #7 Evaluated at [facility] by Dr. Tomasa Hosteller for autologous transplant on 05/27/2013 and was quoted a 3% chance of significant heart damage and possibly death from the treatment and a 50% chance of being alive 5 years from now after transplant (versus perhaps 2 years without). After much thought the patient and family have decided firmly they do not wish to proceed to stem cell transplant.  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 treated with vancomycin IV x14 days completed 07/02/2013, followed by doxycycline for 2 additional weeks  #10  started maintenance Rituxan May 2015 (1 dose every 8 weeks)  #11 A-fib, on rivaroxaban  #12 prostate cancer, stage IV-- per Dr [name]  (a) obstructive uropathy secondary to bladder mass (Gleason 9), s/p  L stenting 02/05/2015, exchanged PRN  #13 PET scan 04/29/2016 shows continuing evidence of response, with no significant abnormal hypermetabolic activity to indicate recurrent or active disease  (a) repeat PET scan 02/13/2017 shows a continuing metabolic response  PLAN: [name] is now almost 4 years out from his last chemotherapy for his non-Hodgkin's lymphoma.  He continues in complete radiologic remission.  This is very favorable.  We are continuing maintenance rituximab every 8 weeks.  He was scheduled to have a dose today, but it turns out he is quite anemic, with a hemoglobin under 10.  He has been having significant hematuria.  I think it would be prudent to postpone the right toxin today, do it next week, and have urology evaluate.  He will return next week for his rituximab and then he will see [place] again with his 16-week treatment.  They know to call for any other issues  that may develop before the next visit.   [name], [name], MD  03/15/17 2:15 PM Medical Oncology and Hematology [facility] [address] [place], [place] [address] Tel. [phone]    Fax. [phone]  This document serves as a record of services personally performed by [name], MD. It was created on his behalf by Margit Banda, a trained medical scribe. The creation of this record is based on the scribe's personal observations and the provider's statements to them.   I have reviewed the above documentation for accuracy and completeness, and I agree with the above.

## 2017-03-15 ENCOUNTER — Ambulatory Visit: Payer: Medicare Other

## 2017-03-15 ENCOUNTER — Other Ambulatory Visit (HOSPITAL_BASED_OUTPATIENT_CLINIC_OR_DEPARTMENT_OTHER): Payer: Medicare Other

## 2017-03-15 ENCOUNTER — Telehealth: Payer: Self-pay | Admitting: Oncology

## 2017-03-15 ENCOUNTER — Ambulatory Visit (HOSPITAL_BASED_OUTPATIENT_CLINIC_OR_DEPARTMENT_OTHER): Payer: Medicare Other | Admitting: Oncology

## 2017-03-15 VITALS — BP 129/68 | HR 73 | Temp 97.9°F | Resp 20 | Ht 68.0 in | Wt 176.4 lb

## 2017-03-15 DIAGNOSIS — C8333 Diffuse large B-cell lymphoma, intra-abdominal lymph nodes: Secondary | ICD-10-CM

## 2017-03-15 DIAGNOSIS — I4891 Unspecified atrial fibrillation: Secondary | ICD-10-CM | POA: Diagnosis not present

## 2017-03-15 DIAGNOSIS — R319 Hematuria, unspecified: Secondary | ICD-10-CM

## 2017-03-15 DIAGNOSIS — R310 Gross hematuria: Secondary | ICD-10-CM | POA: Diagnosis not present

## 2017-03-15 DIAGNOSIS — D649 Anemia, unspecified: Secondary | ICD-10-CM

## 2017-03-15 DIAGNOSIS — C8299 Follicular lymphoma, unspecified, extranodal and solid organ sites: Secondary | ICD-10-CM

## 2017-03-15 DIAGNOSIS — C61 Malignant neoplasm of prostate: Secondary | ICD-10-CM

## 2017-03-15 DIAGNOSIS — Z95828 Presence of other vascular implants and grafts: Secondary | ICD-10-CM

## 2017-03-15 DIAGNOSIS — C8580 Other specified types of non-Hodgkin lymphoma, unspecified site: Secondary | ICD-10-CM

## 2017-03-15 DIAGNOSIS — C8219 Follicular lymphoma grade II, extranodal and solid organ sites: Secondary | ICD-10-CM

## 2017-03-15 DIAGNOSIS — N1330 Unspecified hydronephrosis: Secondary | ICD-10-CM | POA: Diagnosis not present

## 2017-03-15 LAB — CBC WITH DIFFERENTIAL/PLATELET
BASO%: 0.4 % (ref 0.0–2.0)
BASOS ABS: 0 10*3/uL (ref 0.0–0.1)
EOS ABS: 0.2 10*3/uL (ref 0.0–0.5)
EOS%: 4 % (ref 0.0–7.0)
HCT: 29.7 % — ABNORMAL LOW (ref 38.4–49.9)
HEMOGLOBIN: 9.8 g/dL — AB (ref 13.0–17.1)
LYMPH%: 5.9 % — AB (ref 14.0–49.0)
MCH: 27.9 pg (ref 27.2–33.4)
MCHC: 33 g/dL (ref 32.0–36.0)
MCV: 84.6 fL (ref 79.3–98.0)
MONO#: 0.4 10*3/uL (ref 0.1–0.9)
MONO%: 9.7 % (ref 0.0–14.0)
NEUT%: 80 % — ABNORMAL HIGH (ref 39.0–75.0)
NEUTROS ABS: 3.6 10*3/uL (ref 1.5–6.5)
PLATELETS: 114 10*3/uL — AB (ref 140–400)
RBC: 3.51 10*6/uL — ABNORMAL LOW (ref 4.20–5.82)
RDW: 14.6 % (ref 11.0–14.6)
WBC: 4.6 10*3/uL (ref 4.0–10.3)
lymph#: 0.3 10*3/uL — ABNORMAL LOW (ref 0.9–3.3)

## 2017-03-15 LAB — COMPREHENSIVE METABOLIC PANEL
ALBUMIN: 3.9 g/dL (ref 3.5–5.0)
ALT: 31 U/L (ref 0–55)
AST: 30 U/L (ref 5–34)
Alkaline Phosphatase: 63 U/L (ref 40–150)
Anion Gap: 10 mEq/L (ref 3–11)
BUN: 21.1 mg/dL (ref 7.0–26.0)
CHLORIDE: 105 meq/L (ref 98–109)
CO2: 23 meq/L (ref 22–29)
Calcium: 9.3 mg/dL (ref 8.4–10.4)
Creatinine: 1.4 mg/dL — ABNORMAL HIGH (ref 0.7–1.3)
EGFR: 47 mL/min/{1.73_m2} — ABNORMAL LOW (ref >60)
GLUCOSE: 137 mg/dL (ref 70–140)
POTASSIUM: 3.9 meq/L (ref 3.5–5.1)
SODIUM: 138 meq/L (ref 136–145)
Total Bilirubin: 0.33 mg/dL (ref 0.20–1.20)
Total Protein: 6.5 g/dL (ref 6.4–8.3)

## 2017-03-15 LAB — LACTATE DEHYDROGENASE: LDH: 165 U/L (ref 125–245)

## 2017-03-15 MED ORDER — SODIUM CHLORIDE 0.9 % IJ SOLN
10.0000 mL | INTRAMUSCULAR | Status: DC | PRN
  Administered 2017-03-15: 10 mL via INTRAVENOUS
  Filled 2017-03-15: qty 10

## 2017-03-15 NOTE — Telephone Encounter (Signed)
Called patient regarding 11/27 per [facility]

## 2017-03-15 NOTE — Telephone Encounter (Signed)
Gave avs and calendar for January and February 2019 

## 2017-03-21 ENCOUNTER — Other Ambulatory Visit (HOSPITAL_BASED_OUTPATIENT_CLINIC_OR_DEPARTMENT_OTHER): Payer: Medicare Other

## 2017-03-21 ENCOUNTER — Ambulatory Visit (HOSPITAL_BASED_OUTPATIENT_CLINIC_OR_DEPARTMENT_OTHER): Payer: Medicare Other

## 2017-03-21 VITALS — BP 127/65 | HR 76 | Temp 99.6°F | Resp 16 | Ht 68.0 in | Wt 171.8 lb

## 2017-03-21 DIAGNOSIS — C8593 Non-Hodgkin lymphoma, unspecified, intra-abdominal lymph nodes: Secondary | ICD-10-CM

## 2017-03-21 DIAGNOSIS — C8580 Other specified types of non-Hodgkin lymphoma, unspecified site: Secondary | ICD-10-CM

## 2017-03-21 DIAGNOSIS — Z5112 Encounter for antineoplastic immunotherapy: Secondary | ICD-10-CM | POA: Diagnosis not present

## 2017-03-21 DIAGNOSIS — C8203 Follicular lymphoma grade I, intra-abdominal lymph nodes: Secondary | ICD-10-CM

## 2017-03-21 DIAGNOSIS — C8590 Non-Hodgkin lymphoma, unspecified, unspecified site: Secondary | ICD-10-CM

## 2017-03-21 DIAGNOSIS — C61 Malignant neoplasm of prostate: Secondary | ICD-10-CM

## 2017-03-21 DIAGNOSIS — C8219 Follicular lymphoma grade II, extranodal and solid organ sites: Secondary | ICD-10-CM

## 2017-03-21 LAB — CBC WITH DIFFERENTIAL/PLATELET
BASO%: 0.3 % (ref 0.0–2.0)
BASOS ABS: 0 10*3/uL (ref 0.0–0.1)
EOS%: 4 % (ref 0.0–7.0)
Eosinophils Absolute: 0.2 10*3/uL (ref 0.0–0.5)
HCT: 29.7 % — ABNORMAL LOW (ref 38.4–49.9)
HEMOGLOBIN: 9.6 g/dL — AB (ref 13.0–17.1)
LYMPH#: 0.3 10*3/uL — AB (ref 0.9–3.3)
LYMPH%: 5.7 % — ABNORMAL LOW (ref 14.0–49.0)
MCH: 27.3 pg (ref 27.2–33.4)
MCHC: 32.3 g/dL (ref 32.0–36.0)
MCV: 84.3 fL (ref 79.3–98.0)
MONO#: 0.4 10*3/uL (ref 0.1–0.9)
MONO%: 7.6 % (ref 0.0–14.0)
NEUT%: 82.4 % — ABNORMAL HIGH (ref 39.0–75.0)
NEUTROS ABS: 4.2 10*3/uL (ref 1.5–6.5)
Platelets: 150 10*3/uL (ref 140–400)
RBC: 3.52 10*6/uL — ABNORMAL LOW (ref 4.20–5.82)
RDW: 14.8 % — AB (ref 11.0–14.6)
WBC: 5.1 10*3/uL (ref 4.0–10.3)

## 2017-03-21 LAB — COMPREHENSIVE METABOLIC PANEL
ALBUMIN: 3.8 g/dL (ref 3.5–5.0)
ALK PHOS: 57 U/L (ref 40–150)
ALT: 23 U/L (ref 0–55)
ANION GAP: 10 meq/L (ref 3–11)
AST: 25 U/L (ref 5–34)
BILIRUBIN TOTAL: 0.27 mg/dL (ref 0.20–1.20)
BUN: 25.1 mg/dL (ref 7.0–26.0)
CALCIUM: 9.3 mg/dL (ref 8.4–10.4)
CO2: 23 mEq/L (ref 22–29)
CREATININE: 1.6 mg/dL — AB (ref 0.7–1.3)
Chloride: 107 mEq/L (ref 98–109)
EGFR: 40 mL/min/{1.73_m2} — AB (ref >60)
Glucose: 201 mg/dl — ABNORMAL HIGH (ref 70–140)
Potassium: 3.6 mEq/L (ref 3.5–5.1)
Sodium: 139 mEq/L (ref 136–145)
TOTAL PROTEIN: 6.5 g/dL (ref 6.4–8.3)

## 2017-03-21 LAB — LACTATE DEHYDROGENASE: LDH: 132 U/L (ref 125–245)

## 2017-03-21 MED ORDER — DIPHENHYDRAMINE HCL 25 MG PO CAPS
25.0000 mg | ORAL_CAPSULE | Freq: Once | ORAL | Status: AC
  Administered 2017-03-21: 25 mg via ORAL

## 2017-03-21 MED ORDER — ACETAMINOPHEN 325 MG PO TABS
ORAL_TABLET | ORAL | Status: AC
  Filled 2017-03-21: qty 2

## 2017-03-21 MED ORDER — SODIUM CHLORIDE 0.9 % IV SOLN
350.0000 mg/m2 | Freq: Once | INTRAVENOUS | Status: AC
  Administered 2017-03-21: 700 mg via INTRAVENOUS
  Filled 2017-03-21: qty 50

## 2017-03-21 MED ORDER — SODIUM CHLORIDE 0.9 % IV SOLN
Freq: Once | INTRAVENOUS | Status: AC
  Administered 2017-03-21: 15:00:00 via INTRAVENOUS

## 2017-03-21 MED ORDER — SODIUM CHLORIDE 0.9 % IJ SOLN
10.0000 mL | INTRAMUSCULAR | Status: DC | PRN
  Administered 2017-03-21: 10 mL
  Filled 2017-03-21: qty 10

## 2017-03-21 MED ORDER — ACETAMINOPHEN 325 MG PO TABS
650.0000 mg | ORAL_TABLET | Freq: Once | ORAL | Status: AC
  Administered 2017-03-21: 650 mg via ORAL

## 2017-03-21 MED ORDER — HEPARIN SOD (PORK) LOCK FLUSH 100 UNIT/ML IV SOLN
500.0000 [IU] | Freq: Once | INTRAVENOUS | Status: AC | PRN
  Administered 2017-03-21: 500 [IU]
  Filled 2017-03-21: qty 5

## 2017-03-21 MED ORDER — DIPHENHYDRAMINE HCL 25 MG PO CAPS
ORAL_CAPSULE | ORAL | Status: AC
  Filled 2017-03-21: qty 1

## 2017-03-21 NOTE — Progress Notes (Signed)
Pt with creatinine of 1.6 okay to treat per Dr. Jana Hakim.

## 2017-03-21 NOTE — Patient Instructions (Signed)
[facility] Discharge Instructions for Patients Receiving Chemotherapy  Today you received the following chemotherapy agents:  Rituxan.  To help prevent nausea and vomiting after your treatment, we encourage you to take your nausea medication as directed.   If you develop nausea and vomiting that is not controlled by your nausea medication, call the clinic.   BELOW ARE SYMPTOMS THAT SHOULD BE REPORTED IMMEDIATELY:  *FEVER GREATER THAN 100.5 F  *CHILLS WITH OR WITHOUT FEVER  NAUSEA AND VOMITING THAT IS NOT CONTROLLED WITH YOUR NAUSEA MEDICATION  *UNUSUAL [name]  *UNUSUAL BRUISING OR BLEEDING  TENDERNESS IN MOUTH AND THROAT WITH OR WITHOUT PRESENCE OF ULCERS  *URINARY PROBLEMS  *BOWEL PROBLEMS  UNUSUAL RASH Items with * indicate a potential emergency and should be followed up as soon as possible.  Feel free to call the clinic should you have any questions or concerns. The clinic phone number is [phone].  Please show the CHEMO ALERT CARD at check-in to the Emergency Department and triage nurse.   

## 2017-05-05 DIAGNOSIS — B078 Other viral warts: Secondary | ICD-10-CM | POA: Diagnosis not present

## 2017-05-05 DIAGNOSIS — L821 Other seborrheic keratosis: Secondary | ICD-10-CM | POA: Diagnosis not present

## 2017-05-05 DIAGNOSIS — L570 Actinic keratosis: Secondary | ICD-10-CM | POA: Diagnosis not present

## 2017-05-05 DIAGNOSIS — Z85828 Personal history of other malignant neoplasm of skin: Secondary | ICD-10-CM | POA: Diagnosis not present

## 2017-05-19 ENCOUNTER — Inpatient Hospital Stay: Payer: Medicare Other

## 2017-05-19 ENCOUNTER — Inpatient Hospital Stay: Payer: Medicare Other | Attending: Oncology

## 2017-05-19 VITALS — BP 124/53 | HR 71 | Temp 97.0°F | Resp 18 | Wt 180.8 lb

## 2017-05-19 DIAGNOSIS — C8593 Non-Hodgkin lymphoma, unspecified, intra-abdominal lymph nodes: Secondary | ICD-10-CM | POA: Diagnosis not present

## 2017-05-19 DIAGNOSIS — C8590 Non-Hodgkin lymphoma, unspecified, unspecified site: Secondary | ICD-10-CM

## 2017-05-19 DIAGNOSIS — Z95828 Presence of other vascular implants and grafts: Secondary | ICD-10-CM

## 2017-05-19 DIAGNOSIS — Z5112 Encounter for antineoplastic immunotherapy: Secondary | ICD-10-CM | POA: Insufficient documentation

## 2017-05-19 DIAGNOSIS — C61 Malignant neoplasm of prostate: Secondary | ICD-10-CM

## 2017-05-19 DIAGNOSIS — C8203 Follicular lymphoma grade I, intra-abdominal lymph nodes: Secondary | ICD-10-CM

## 2017-05-19 DIAGNOSIS — C8580 Other specified types of non-Hodgkin lymphoma, unspecified site: Secondary | ICD-10-CM

## 2017-05-19 DIAGNOSIS — C8219 Follicular lymphoma grade II, extranodal and solid organ sites: Secondary | ICD-10-CM

## 2017-05-19 LAB — COMPREHENSIVE METABOLIC PANEL
ALBUMIN: 3.9 g/dL (ref 3.5–5.0)
ALT: 22 U/L (ref 0–55)
ANION GAP: 10 (ref 3–11)
AST: 32 U/L (ref 5–34)
Alkaline Phosphatase: 70 U/L (ref 40–150)
BUN: 28 mg/dL — ABNORMAL HIGH (ref 7–26)
CHLORIDE: 104 mmol/L (ref 98–109)
CO2: 22 mmol/L (ref 22–29)
Calcium: 9.1 mg/dL (ref 8.4–10.4)
Creatinine, Ser: 1.37 mg/dL — ABNORMAL HIGH (ref 0.70–1.30)
GFR calc Af Amer: 53 mL/min — ABNORMAL LOW (ref >60)
GFR calc non Af Amer: 46 mL/min — ABNORMAL LOW (ref >60)
GLUCOSE: 197 mg/dL — AB (ref 70–140)
POTASSIUM: 4.4 mmol/L (ref 3.5–5.1)
SODIUM: 136 mmol/L (ref 136–145)
Total Bilirubin: 0.3 mg/dL (ref 0.2–1.2)
Total Protein: 6.7 g/dL (ref 6.4–8.3)

## 2017-05-19 LAB — CBC WITH DIFFERENTIAL/PLATELET
BASOS ABS: 0 10*3/uL (ref 0.0–0.1)
Basophils Relative: 0 %
Eosinophils Absolute: 0.2 10*3/uL (ref 0.0–0.5)
Eosinophils Relative: 4 %
HEMATOCRIT: 27.5 % — AB (ref 38.4–49.9)
Hemoglobin: 8.6 g/dL — ABNORMAL LOW (ref 13.0–17.1)
LYMPHS PCT: 6 %
Lymphs Abs: 0.3 10*3/uL — ABNORMAL LOW (ref 0.9–3.3)
MCH: 25.1 pg — ABNORMAL LOW (ref 27.2–33.4)
MCHC: 31.3 g/dL — AB (ref 32.0–36.0)
MCV: 80.2 fL (ref 79.3–98.0)
MONO ABS: 0.4 10*3/uL (ref 0.1–0.9)
MONOS PCT: 9 %
NEUTROS ABS: 3.5 10*3/uL (ref 1.5–6.5)
Neutrophils Relative %: 81 %
Platelets: 112 10*3/uL — ABNORMAL LOW (ref 140–400)
RBC: 3.43 MIL/uL — ABNORMAL LOW (ref 4.20–5.82)
RDW: 15.6 % (ref 11.0–15.6)
WBC: 4.4 10*3/uL (ref 4.0–10.3)

## 2017-05-19 LAB — LACTATE DEHYDROGENASE: LDH: 144 U/L (ref 125–245)

## 2017-05-19 MED ORDER — DIPHENHYDRAMINE HCL 25 MG PO CAPS
ORAL_CAPSULE | ORAL | Status: AC
  Filled 2017-05-19: qty 1

## 2017-05-19 MED ORDER — DIPHENHYDRAMINE HCL 25 MG PO CAPS
25.0000 mg | ORAL_CAPSULE | Freq: Once | ORAL | Status: AC
  Administered 2017-05-19: 25 mg via ORAL

## 2017-05-19 MED ORDER — ACETAMINOPHEN 325 MG PO TABS
650.0000 mg | ORAL_TABLET | Freq: Once | ORAL | Status: AC
  Administered 2017-05-19: 650 mg via ORAL

## 2017-05-19 MED ORDER — SODIUM CHLORIDE 0.9 % IV SOLN
Freq: Once | INTRAVENOUS | Status: AC
  Administered 2017-05-19: 11:00:00 via INTRAVENOUS

## 2017-05-19 MED ORDER — ACETAMINOPHEN 325 MG PO TABS
ORAL_TABLET | ORAL | Status: AC
  Filled 2017-05-19: qty 2

## 2017-05-19 MED ORDER — SODIUM CHLORIDE 0.9 % IJ SOLN
10.0000 mL | INTRAMUSCULAR | Status: DC | PRN
  Administered 2017-05-19: 10 mL
  Filled 2017-05-19: qty 10

## 2017-05-19 MED ORDER — SODIUM CHLORIDE 0.9 % IV SOLN
350.0000 mg/m2 | Freq: Once | INTRAVENOUS | Status: AC
  Administered 2017-05-19: 700 mg via INTRAVENOUS
  Filled 2017-05-19: qty 50

## 2017-05-19 MED ORDER — SODIUM CHLORIDE 0.9 % IJ SOLN
10.0000 mL | INTRAMUSCULAR | Status: DC | PRN
  Administered 2017-05-19: 10 mL via INTRAVENOUS
  Filled 2017-05-19: qty 10

## 2017-05-19 MED ORDER — HEPARIN SOD (PORK) LOCK FLUSH 100 UNIT/ML IV SOLN
500.0000 [IU] | Freq: Once | INTRAVENOUS | Status: AC | PRN
  Administered 2017-05-19: 500 [IU]
  Filled 2017-05-19: qty 5

## 2017-05-19 NOTE — Patient Instructions (Signed)
[facility] Center Discharge Instructions for Patients Receiving Chemotherapy  Today you received the following chemotherapy agents:  Rituxan.  To help prevent nausea and vomiting after your treatment, we encourage you to take your nausea medication as directed.   If you develop nausea and vomiting that is not controlled by your nausea medication, call the clinic.   BELOW ARE SYMPTOMS THAT SHOULD BE REPORTED IMMEDIATELY:  *FEVER GREATER THAN 100.5 F  *CHILLS WITH OR WITHOUT FEVER  NAUSEA AND VOMITING THAT IS NOT CONTROLLED WITH YOUR NAUSEA MEDICATION  *UNUSUAL [name]  *UNUSUAL BRUISING OR BLEEDING  TENDERNESS IN MOUTH AND THROAT WITH OR WITHOUT PRESENCE OF ULCERS  *URINARY PROBLEMS  *BOWEL PROBLEMS  UNUSUAL RASH Items with * indicate a potential emergency and should be followed up as soon as possible.  Feel free to call the clinic should you have any questions or concerns. The clinic phone number is [phone].  Please show the CHEMO ALERT CARD at check-in to the Emergency Department and triage nurse.   

## 2017-06-02 DIAGNOSIS — C61 Malignant neoplasm of prostate: Secondary | ICD-10-CM | POA: Diagnosis not present

## 2017-06-02 DIAGNOSIS — N131 Hydronephrosis with ureteral stricture, not elsewhere classified: Secondary | ICD-10-CM | POA: Diagnosis not present

## 2017-06-02 DIAGNOSIS — R310 Gross hematuria: Secondary | ICD-10-CM | POA: Diagnosis not present

## 2017-06-05 DIAGNOSIS — E1140 Type 2 diabetes mellitus with diabetic neuropathy, unspecified: Secondary | ICD-10-CM | POA: Diagnosis not present

## 2017-06-05 DIAGNOSIS — I4891 Unspecified atrial fibrillation: Secondary | ICD-10-CM | POA: Diagnosis not present

## 2017-06-05 DIAGNOSIS — E119 Type 2 diabetes mellitus without complications: Secondary | ICD-10-CM | POA: Diagnosis not present

## 2017-06-05 DIAGNOSIS — H269 Unspecified cataract: Secondary | ICD-10-CM | POA: Diagnosis not present

## 2017-06-05 DIAGNOSIS — E1121 Type 2 diabetes mellitus with diabetic nephropathy: Secondary | ICD-10-CM | POA: Diagnosis not present

## 2017-06-05 DIAGNOSIS — E782 Mixed hyperlipidemia: Secondary | ICD-10-CM | POA: Diagnosis not present

## 2017-06-05 DIAGNOSIS — D649 Anemia, unspecified: Secondary | ICD-10-CM | POA: Diagnosis not present

## 2017-06-05 DIAGNOSIS — C61 Malignant neoplasm of prostate: Secondary | ICD-10-CM | POA: Diagnosis not present

## 2017-06-05 DIAGNOSIS — F329 Major depressive disorder, single episode, unspecified: Secondary | ICD-10-CM | POA: Diagnosis not present

## 2017-06-05 DIAGNOSIS — I259 Chronic ischemic heart disease, unspecified: Secondary | ICD-10-CM | POA: Diagnosis not present

## 2017-06-05 DIAGNOSIS — I10 Essential (primary) hypertension: Secondary | ICD-10-CM | POA: Diagnosis not present

## 2017-06-09 ENCOUNTER — Other Ambulatory Visit: Payer: Self-pay | Admitting: Urology

## 2017-06-21 NOTE — Patient Instructions (Addendum)
[name]  06/21/2017   Your procedure is scheduled on: 06-29-17  Report to [facility] Main  Entrance  Report to admitting at      200 PM   Call this number if you have problems the morning of surgery [phone]    Remember: Do not eat food  :After Midnight. You may have clear liquids until 1000 am then nothing by mouth     CLEAR LIQUID DIET   Foods Allowed                                                                     Foods Excluded  Coffee and tea, regular and decaf                             liquids that you cannot  Plain Jell-O in any flavor                                             see through such as: Fruit ices (not with fruit pulp)                                     milk, soups, orange juice  Iced Popsicles                                    All solid food Carbonated beverages, regular and diet                                    Cranberry, grape and apple juices Sports drinks like Gatorade Lightly seasoned clear broth or consume(fat free) Sugar, honey syrup  Sample Menu Breakfast                                Lunch                                     Supper Cranberry juice                    Beef broth                            Chicken broth Jell-O                                     Grape juice                           Apple juice Coffee or tea  Jell-O                                      Popsicle                                                Coffee or tea                        Coffee or tea  _____________________________________________________________________     Take these medicines the morning of surgery with A SIP OF WATER: zoloft, gabapentin, zyrtec, amlodipine, allopurinol              DO NOT TAKE ANY   ORAL   DIABETIC MEDICATIONS DAY OF YOUR SURGERY                               You may not have any metal on your body including hair pins and              piercings  Do not wear jewelry ,  lotions, powders or perfumes, deodorant                       Men may shave face and neck.   Do not bring valuables to the hospital. [place].  Contacts, dentures or bridgework may not be worn into surgery.  Leave suitcase in the car. After surgery it may be brought to your room.     Patients discharged the day of surgery will not be allowed to drive home.  Name and phone number of your driver:  Special Instructions: N/A              Please read over the following fact sheets you were given: _____________________________________________________________________             [facility] - Preparing for Surgery Before surgery, you can play an important role.  Because skin is not sterile, your skin needs to be as free of germs as possible.  You can reduce the number of germs on your skin by washing with CHG (chlorahexidine gluconate) soap before surgery.  CHG is an antiseptic cleaner which kills germs and bonds with the skin to continue killing germs even after washing. Please DO NOT use if you have an allergy to CHG or antibacterial soaps.  If your skin becomes reddened/irritated stop using the CHG and inform your nurse when you arrive at Short Stay. Do not shave (including legs and underarms) for at least 48 hours prior to the first CHG shower.  You may shave your face/neck. Please follow these instructions carefully:  1.  Shower with CHG Soap the night before surgery and the  morning of Surgery.  2.  If you choose to wash your hair, wash your hair first as usual with your  normal  shampoo.  3.  After you shampoo, rinse your hair and body thoroughly to remove the  shampoo.  4.  Use CHG as you would any other liquid soap.  You can apply chg directly  to the skin and wash                       Gently with a scrungie or clean washcloth.  5.  Apply the CHG Soap to your body ONLY FROM THE NECK DOWN.   Do not use on face/  open                           Wound or open sores. Avoid contact with eyes, ears mouth and genitals (private parts).                       Wash face,  Genitals (private parts) with your normal soap.             6.  Wash thoroughly, paying special attention to the area where your surgery  will be performed.  7.  Thoroughly rinse your body with warm water from the neck down.  8.  DO NOT shower/wash with your normal soap after using and rinsing off  the CHG Soap.                9.  Pat yourself dry with a clean towel.            10.  Wear clean pajamas.            11.  Place clean sheets on your bed the night of your first shower and do not  sleep with pets. Day of Surgery : Do not apply any lotions/deodorants the morning of surgery.  Please wear clean clothes to the hospital/surgery center.  FAILURE TO FOLLOW THESE INSTRUCTIONS MAY RESULT IN THE CANCELLATION OF YOUR SURGERY PATIENT SIGNATURE_________________________________  NURSE SIGNATURE__________________________________  ________________________________________________________________________ How to Manage Your Diabetes Before and After Surgery  Why is it important to control my blood sugar before and after surgery? . Improving blood sugar levels before and after surgery helps healing and can limit problems. . A way of improving blood sugar control is eating a healthy diet by: o  Eating less sugar and carbohydrates o  Increasing activity/exercise o  Talking with your doctor about reaching your blood sugar goals . High blood sugars (greater than 180 mg/dL) can raise your risk of infections and slow your recovery, so you will need to focus on controlling your diabetes during the weeks before surgery. . Make sure that the doctor who takes care of your diabetes knows about your planned surgery including the date and location.  How do I manage my blood sugar before surgery? . Check your blood sugar at least 4 times a day, starting 2 days  before surgery, to make sure that the level is not too high or low. o Check your blood sugar the morning of your surgery when you wake up and every 2 hours until you get to the Short Stay unit. . If your blood sugar is less than 70 mg/dL, you will need to treat for low blood sugar: o Do not take insulin. o Treat a low blood sugar (less than 70 mg/dL) with  cup of clear juice (cranberry or apple), 4 glucose tablets, OR glucose gel. o Recheck blood sugar in 15 minutes after treatment (to make sure it is greater than 70 mg/dL). If your blood sugar is not greater than 70 mg/dL on recheck, call  [phone] for further instructions. . Report your blood sugar to the short stay nurse when you get to Short Stay.  . If you are admitted to the hospital after surgery: o Your blood sugar will be checked by the staff and you will probably be given insulin after surgery (instead of oral diabetes medicines) to make sure you have good blood sugar levels. o The goal for blood sugar control after surgery is 80-180 mg/dL.   WHAT DO I DO ABOUT MY DIABETES MEDICATION?  Marland Kitchen Do not take oral diabetes medicines (pills) the morning of surgery.         . THE DAY BEFORE SURGERY, take   Insulin AS USUAL      . THE MORNING OF SURGERY, take  50% OF TOUJEO INSULIN   32  units  . The day of surgery, do not take other diabetes injectables, including Byetta (exenatide), Bydureon (exenatide ER), Victoza (liraglutide), or Trulicity (dulaglutide).  . If your CBG is greater than 220 mg/dL, you may take  of your sliding scale  . (correction) dose of insulin.     Patient Signature:  Date:   Nurse Signature:  Date:   Reviewed and Endorsed by [facility] Patient Education Committee, August 2015

## 2017-06-21 NOTE — Progress Notes (Signed)
ekg 09-16-16 epic  ovn 02-24-17 and 02-21-17 Dr. [name] chart

## 2017-06-23 ENCOUNTER — Encounter (HOSPITAL_COMMUNITY): Payer: Self-pay

## 2017-06-23 ENCOUNTER — Encounter (HOSPITAL_COMMUNITY)
Admission: RE | Admit: 2017-06-23 | Discharge: 2017-06-23 | Disposition: A | Discharge status: Home / Self Care | Payer: Medicare Other | Source: Ambulatory Visit | Attending: Urology | Admitting: Urology

## 2017-06-23 ENCOUNTER — Other Ambulatory Visit: Payer: Self-pay

## 2017-06-23 DIAGNOSIS — N135 Crossing vessel and stricture of ureter without hydronephrosis: Secondary | ICD-10-CM | POA: Insufficient documentation

## 2017-06-23 DIAGNOSIS — C61 Malignant neoplasm of prostate: Secondary | ICD-10-CM | POA: Insufficient documentation

## 2017-06-23 DIAGNOSIS — Z79899 Other long term (current) drug therapy: Secondary | ICD-10-CM | POA: Insufficient documentation

## 2017-06-23 DIAGNOSIS — Z01818 Encounter for other preprocedural examination: Secondary | ICD-10-CM | POA: Diagnosis not present

## 2017-06-23 DIAGNOSIS — Z7901 Long term (current) use of anticoagulants: Secondary | ICD-10-CM | POA: Insufficient documentation

## 2017-06-23 DIAGNOSIS — Z7984 Long term (current) use of oral hypoglycemic drugs: Secondary | ICD-10-CM | POA: Insufficient documentation

## 2017-06-23 HISTORY — DX: Unspecified asthma, uncomplicated: J45.909

## 2017-06-23 HISTORY — DX: Personal history of urinary calculi: Z87.442

## 2017-06-23 HISTORY — DX: Cardiac murmur, unspecified: R01.1

## 2017-06-23 LAB — CBC
HCT: 29.7 % — ABNORMAL LOW (ref 39.0–52.0)
Hemoglobin: 9.2 g/dL — ABNORMAL LOW (ref 13.0–17.0)
MCH: 23.8 pg — AB (ref 26.0–34.0)
MCHC: 31 g/dL (ref 30.0–36.0)
MCV: 76.9 fL — AB (ref 78.0–100.0)
PLATELETS: 201 10*3/uL (ref 150–400)
RBC: 3.86 MIL/uL — ABNORMAL LOW (ref 4.22–5.81)
RDW: 16.5 % — AB (ref 11.5–15.5)
WBC: 5.3 10*3/uL (ref 4.0–10.5)

## 2017-06-23 LAB — BASIC METABOLIC PANEL
Anion gap: 11 (ref 5–15)
BUN: 29 mg/dL — AB (ref 6–20)
CHLORIDE: 101 mmol/L (ref 101–111)
CO2: 23 mmol/L (ref 22–32)
CREATININE: 1.54 mg/dL — AB (ref 0.61–1.24)
Calcium: 9.7 mg/dL (ref 8.9–10.3)
GFR calc Af Amer: 46 mL/min — ABNORMAL LOW (ref >60)
GFR calc non Af Amer: 40 mL/min — ABNORMAL LOW (ref >60)
Glucose, Bld: 194 mg/dL — ABNORMAL HIGH (ref 65–99)
Potassium: 4.7 mmol/L (ref 3.5–5.1)
SODIUM: 135 mmol/L (ref 135–145)

## 2017-06-23 LAB — HEMOGLOBIN A1C
Hgb A1c MFr Bld: 7.5 % — ABNORMAL HIGH (ref 4.8–5.6)
Mean Plasma Glucose: 168.55 mg/dL

## 2017-06-23 LAB — GLUCOSE, CAPILLARY: Glucose-Capillary: 229 mg/dL — ABNORMAL HIGH (ref 65–99)

## 2017-06-23 NOTE — Progress Notes (Signed)
Cbc,bmp,hgba1c done 06-23-17 routed to dr. [name] via epic

## 2017-06-28 NOTE — H&P (Signed)
Office Visit Report     06/02/2017   --------------------------------------------------------------------------------   [name]. Acklin  MRN: [id number]  PRIMARY CARE:  [name], MD  DOB: 01-Mar-1932, 84 year old Male  REFERRING:  Azucena Fallen, NP  SSN: [id number]  PROVIDER:  Raynelle Bring, M.D.    LOCATION:  [facility] [phone]   --------------------------------------------------------------------------------   CC/HPI: 1. Metastatic prostate cancer  2. Bilateral ureteral obstruction  3. Hematuria   [name] returns today for the above issues. He has continued to tolerate androgen deprivation therapy quite well. He remains active working outside in his garden on a daily basis. He denies significant side effects related to therapy. He continues to receive Rituxan for his B-cell lymphoma which still has been well controlled. He has had more frequent gross hematuria after his last stent placement and has been evaluated multiple times for this. There is been no evidence to suggest infection. It is likely related to his indwelling stents and chronic anticoagulation. Currently, he feels that he empties his bladder well. The hematuria has been intermittent and currently has not been much of a problem at all.     ALLERGIES: Niacin ER CPCR    MEDICATIONS: Aldra Cream 1 PO Daily  Allopurinol 300 MG Oral Tablet Oral  Amlodipine Besylate 5 mg tablet Oral  Atorvastatin Calcium 20 mg tablet Oral  Calcium 600-Vit D3 600 mg calcium (1,500 mg)-200 unit tablet Oral  Cetirizine Hcl 10 mg tablet Oral  Creon  Fenofibrate 160 mg tablet  Gabapentin 300 mg capsule Oral  Lidocaine  Magnesium Oxide 400 mg tablet Oral  Metformin Hcl 1,000 mg tablet Oral  Multivitamins tablet Oral  Sertraline Hcl 100 mg tablet Oral  Toujeo Solostar 300 unit/ml (1.5 ml) insulin pen Subcutaneous  Vitamin B-12 TABS Oral  Vitamin C 500 mg tablet Oral  Xarelto 15 mg tablet Oral     GU PSH:  Cystoscopy Insert Stent, Bilateral - 12/01/2016, Bilateral - 05/26/2016, Bilateral - 10/29/2015, 06/08/2015, 02/23/2015 Cystoscopy TURBT <2 cm - 02/23/2015 Remove Prostate. - 2014      PSH Notes: Kidney Surgery   NON-GU PSH: Ankle Arthroscopy/surgery Shoulder Surgery (Unspecified)    GU PMH: Low back pain - 02/03/2017 Ureteral obstruction - 11/16/2016 Gross hematuria (Stable, Chronic), Culture urine. No ABX unless culture proven UTI. Reassured both pt and daughter that intermittent gross hematuria along with microscopic hematuria not unexpected with long term indwelling stents. Today's UA has small amount of microscopic hematuria. Pt will f/u as scheduled later this month with Dr. [name] to discuss cysto/stent exchange - 10/27/2016, Gross hematuria, - 01/28/2015 Hydronephrosis Unspec - 02/10/2016 Prostate Cancer, Adenocarcinoma of prostate - 08/19/2015 Kidney Failure, acute, Unspec, Acute kidney injury - 04/19/2015 Urinary Tract Inf, Unspec site, Urinary tract infection - 02/19/2015 Urinary Urgency, Urinary urgency - 01/28/2015      PMH Notes:   1) Prostate cancer: He is s/p primary surgical with a radical prostatectomy in 2001 for pT3b N0 Mx, Gleason 3+4=7 adenocarcinoma of the prostate. He developed a biochemical recurrence in December 2006 when his PSA was noted to be 0.6. His PSA had increased slowly and was only 1.4 in January 2010 but further increased to 8.01 in November 2014. At that time, a CT scan was performed that demonstrated a retroperitoneal mass/lymphadenopathy and it was found to be recurrent B cell lymphoma. He had initially been diagnosed with lymphoma in 2006 and was treated with CHOP chemotherapy and rituximab which he stopped in 2008. After his recurrence  of lymphoma, he was given options and refused a stem cell transplant and was again treated with chemotherapy and maintenance rituximab. His PSA at the time of his initial consultation with me in November 2015 was 15.83. After  reviewing options for management, he and his family wished to avoid systemic therapy unless absolutely necessary. He developed measurable metastatic disease to the bone in November 2016 and bilateral ureteral obstruction due to locally advanced prostate cancer. He began systemic ADT in November 2016.   Nov 2016: Began systemic ADT for measurable metastatic disease   2) Urinary retention: He has a history of urinary retention after a laparoscopic cholecystectomy in December 2014 but passed a voiding trial without problems since then.   3) Bilateral ureteral obstruction: He was incidentally noted to have left hydronephrosis on PET imaging for his lymphoma and confirmed on his bone scan imaging for prostate cancer in early 2016. No clear etiology for obstruction was noted on his imaging including no lymphadenopathy. After a discussion with his family including a discussion about renogram imaging to determine if there was obstruction present, he and his family chose to proceed with observation and avoid further imaging as they wished to avoid any intervention (such as stenting) understanding the risk of loss of renal function that may occur. In October 2016, he was noted to have development of bilateral hydronephrosis and worsening renal function prompting cystoscopy that revealed a bladder mass as the likely cause of obstruction. He required bilateral ureteral stenting in October 2016 with stabilization of his renal function.   Last stent change: 12/01/16     NON-GU PMH: Lymphoma, History, History of malignant lymphoma - 04/19/2015 Arthritis Diabetes Type 2 Hypercholesterolemia Hypertension Sleep Apnea    FAMILY HISTORY: cardiac disorder - Runs In Family Diabetes - Runs In Family   SOCIAL HISTORY: Marital Status: Married Preferred Language: English; Ethnicity: Not Hispanic Or Latino; Race: White Current Smoking Status: Patient does not smoke anymore. Has not smoked since 04/29/2016.   Tobacco Use  Assessment Completed: Used Tobacco in last 30 days? Does not drink anymore.  Drinks 1 caffeinated drink per day.    REVIEW OF SYSTEMS:    GU Review Male:   Patient reports frequent urination, hard to postpone urination, get up at night to urinate, and leakage of urine. Patient denies burning/ pain with urination, stream starts and stops, trouble starting your streams, and have to strain to urinate .  Gastrointestinal (Lower):   Patient denies diarrhea and constipation.  Gastrointestinal (Upper):   Patient denies nausea and vomiting.  Constitutional:   Patient denies fever, night sweats, weight loss, and fatigue.  Skin:   Patient denies skin rash/ lesion and itching.  Eyes:   Patient denies blurred vision and double vision.  Ears/ Nose/ Throat:   Patient reports sinus problems. Patient denies sore throat.  Hematologic/Lymphatic:   Patient denies swollen glands and easy bruising.  Cardiovascular:   Patient denies leg swelling and chest pains.  Respiratory:   Patient denies cough and shortness of breath.  Endocrine:   Patient denies excessive thirst.  Musculoskeletal:   Patient denies back pain and joint pain.  Neurological:   Patient reports headaches. Patient denies dizziness.  Psychologic:   Patient denies depression and anxiety.   VITAL SIGNS:      06/02/2017 04:02 PM  Weight 170 lb / 77.11 kg  Height 68 in / 172.72 cm  BP 99/60 mmHg  Pulse 77 /min  BMI 25.8 kg/m   MULTI-SYSTEM PHYSICAL EXAMINATION:  Constitutional: Well-nourished. No physical deformities. Normally developed. Good grooming.  Respiratory: No labored breathing, no use of accessory muscles. Clear bilaterally.  Cardiovascular: Normal temperature, normal extremity pulses, no swelling, no varicosities. Regular rate and rhythm.  Gastrointestinal: No CVA tenderness.     PAST DATA REVIEWED:  Source Of History:  Patient  Lab Test Review:   PSA  Urine Test Review:   Urinalysis   03/15/17 11/16/16 05/18/16 01/06/16  08/29/15 05/02/15 02/20/15 01/23/15  PSA  Total PSA 0.20 ng/mL 0.106 ng/dL 0.056 ng/dl 0.04 ng/dl 0.22  0.74  12.58  31.48     01/06/16 08/28/15 04/11/05 01/12/05 01/09/04  Hormones  Testosterone, Total 30.9 pg/dL 36  2.43  1.89  1.61     PROCEDURES:          Urinalysis w/Scope - 81001 Dipstick Dipstick Cont'd Micro  Specimen: Voided Bilirubin: Neg WBC/hpf: unspun microscopic  Color: Amber Ketones: Neg RBC/hpf: >60/hpf  Appearance: Cloudy Blood: 3+ Bacteria: Few (10-25/hpf)  Specific Gravity: 1.025 Protein: 3+ Cystals: NS (Not Seen)  pH: 5.5 Urobilinogen: 0.2 Casts: NS (Not Seen)  Glucose: Neg Nitrites: Neg Trichomonas: Not Present    Leukocyte Esterase: Trace Mucous: Not Present      Epithelial Cells: 0 - 5/hpf      Yeast: NS (Not Seen)      Sperm: Not Present         Lupron Depot 45 mg / 6 Month - 96402, [id number] The hip was sterilely prepped with alcohol. Lupron was injected intramuscularly (IM) using standard technique. The patient will return as scheduled for his next Lupron injection.   Qty: 45 Adm. By: Sidonie Dickens  Unit: mg Lot No [id number]  Route: IM Exp. Date 06/30/2019  Freq: Q6M Mfgr.:   Site: Right Buttock   ASSESSMENT:      ICD-10 Details  1 GU:   Ureteral obstruction - N13.1   2   Prostate Cancer - C61   3   Gross hematuria - R31.0    PLAN:           Orders Labs Urine Culture, PSA, Total Testosterone          Schedule Labs: 6 Months - Urinalysis  Return Visit/Planned Activity: 6 Months - Office Visit, Lupron          Document Letter(s):  Created for Patient: Clinical Summary         Notes:   1. Bilateral ureteral obstruction: His urine will be cultured. He will be scheduled for cystoscopy and bilateral ureteral stent change in the near future. The potential risks and complications of this procedure were discussed and he gives informed consent to proceed.   2. Metastatic prostate cancer: He will receive Lupron 45 mg today. His PSA will be checked  to ensure he is having a continued excellent response to androgen deprivation therapy alone.   3. Hematuria: This is likely related to his indwelling stents and anticoagulation. Regardless, he will be undergoing cystoscopy at the time of his stent change in the near future.   4. Bone health/testosterone deficiency: Currently, his daughters do have multiple other priorities considering other health issues in the family. We will postpone his DEXA scan and consider this after his next appointment.   Cc: Dr. [name]    * Signed by Raynelle Bring, M.D. on 06/02/17 at 5:12 PM (EST)*

## 2017-06-29 ENCOUNTER — Ambulatory Visit (HOSPITAL_COMMUNITY): Payer: Medicare Other | Admitting: Anesthesiology

## 2017-06-29 ENCOUNTER — Encounter (HOSPITAL_COMMUNITY)
Admission: RE | Disposition: A | Discharge status: Home / Self Care | Payer: Self-pay | Source: Ambulatory Visit | Attending: Urology

## 2017-06-29 ENCOUNTER — Encounter (HOSPITAL_COMMUNITY): Payer: Self-pay | Admitting: Emergency Medicine

## 2017-06-29 ENCOUNTER — Ambulatory Visit (HOSPITAL_COMMUNITY): Payer: Medicare Other

## 2017-06-29 ENCOUNTER — Other Ambulatory Visit: Payer: Self-pay

## 2017-06-29 ENCOUNTER — Ambulatory Visit (HOSPITAL_COMMUNITY)
Admission: RE | Admit: 2017-06-29 | Discharge: 2017-06-29 | Disposition: A | Discharge status: Home / Self Care | Payer: Medicare Other | Source: Ambulatory Visit | Attending: Urology | Admitting: Urology

## 2017-06-29 DIAGNOSIS — I10 Essential (primary) hypertension: Secondary | ICD-10-CM | POA: Diagnosis not present

## 2017-06-29 DIAGNOSIS — N131 Hydronephrosis with ureteral stricture, not elsewhere classified: Secondary | ICD-10-CM | POA: Diagnosis not present

## 2017-06-29 DIAGNOSIS — Z87891 Personal history of nicotine dependence: Secondary | ICD-10-CM | POA: Diagnosis not present

## 2017-06-29 DIAGNOSIS — E119 Type 2 diabetes mellitus without complications: Secondary | ICD-10-CM | POA: Insufficient documentation

## 2017-06-29 DIAGNOSIS — G4730 Sleep apnea, unspecified: Secondary | ICD-10-CM | POA: Diagnosis not present

## 2017-06-29 DIAGNOSIS — C7982 Secondary malignant neoplasm of genital organs: Secondary | ICD-10-CM | POA: Insufficient documentation

## 2017-06-29 DIAGNOSIS — C61 Malignant neoplasm of prostate: Secondary | ICD-10-CM | POA: Diagnosis not present

## 2017-06-29 DIAGNOSIS — E782 Mixed hyperlipidemia: Secondary | ICD-10-CM | POA: Diagnosis not present

## 2017-06-29 DIAGNOSIS — Z79899 Other long term (current) drug therapy: Secondary | ICD-10-CM | POA: Insufficient documentation

## 2017-06-29 DIAGNOSIS — K219 Gastro-esophageal reflux disease without esophagitis: Secondary | ICD-10-CM | POA: Diagnosis not present

## 2017-06-29 DIAGNOSIS — N135 Crossing vessel and stricture of ureter without hydronephrosis: Secondary | ICD-10-CM | POA: Diagnosis not present

## 2017-06-29 DIAGNOSIS — Z794 Long term (current) use of insulin: Secondary | ICD-10-CM | POA: Diagnosis not present

## 2017-06-29 DIAGNOSIS — R310 Gross hematuria: Secondary | ICD-10-CM | POA: Insufficient documentation

## 2017-06-29 DIAGNOSIS — E7800 Pure hypercholesterolemia, unspecified: Secondary | ICD-10-CM | POA: Insufficient documentation

## 2017-06-29 HISTORY — PX: CYSTOSCOPY W/ URETERAL STENT PLACEMENT: SHX1429

## 2017-06-29 LAB — GLUCOSE, CAPILLARY: GLUCOSE-CAPILLARY: 123 mg/dL — AB (ref 65–99)

## 2017-06-29 SURGERY — CYSTOSCOPY, FLEXIBLE, WITH STENT REPLACEMENT
Anesthesia: General | Laterality: Bilateral

## 2017-06-29 MED ORDER — EPHEDRINE SULFATE 50 MG/ML IJ SOLN
INTRAMUSCULAR | Status: DC | PRN
  Administered 2017-06-29: 10 mg via INTRAVENOUS

## 2017-06-29 MED ORDER — ONDANSETRON HCL 4 MG/2ML IJ SOLN
INTRAMUSCULAR | Status: AC
  Filled 2017-06-29: qty 2

## 2017-06-29 MED ORDER — FENTANYL CITRATE (PF) 100 MCG/2ML IJ SOLN
INTRAMUSCULAR | Status: DC | PRN
  Administered 2017-06-29: 25 ug via INTRAVENOUS

## 2017-06-29 MED ORDER — IOHEXOL 300 MG/ML  SOLN
INTRAMUSCULAR | Status: DC | PRN
  Administered 2017-06-29: 8 mL

## 2017-06-29 MED ORDER — EPHEDRINE 5 MG/ML INJ
INTRAVENOUS | Status: AC
  Filled 2017-06-29: qty 10

## 2017-06-29 MED ORDER — LIDOCAINE HCL (CARDIAC) 20 MG/ML IV SOLN
INTRAVENOUS | Status: DC | PRN
  Administered 2017-06-29: 60 mg via INTRAVENOUS

## 2017-06-29 MED ORDER — CIPROFLOXACIN IN D5W 400 MG/200ML IV SOLN
400.0000 mg | Freq: Once | INTRAVENOUS | Status: AC
  Administered 2017-06-29: 400 mg via INTRAVENOUS
  Filled 2017-06-29: qty 200

## 2017-06-29 MED ORDER — DEXAMETHASONE SODIUM PHOSPHATE 10 MG/ML IJ SOLN
INTRAMUSCULAR | Status: AC
  Filled 2017-06-29: qty 1

## 2017-06-29 MED ORDER — LACTATED RINGERS IV SOLN
INTRAVENOUS | Status: DC
  Administered 2017-06-29: 14:00:00 via INTRAVENOUS

## 2017-06-29 MED ORDER — STERILE WATER FOR IRRIGATION IR SOLN
Status: DC | PRN
  Administered 2017-06-29: 3000 mL

## 2017-06-29 MED ORDER — FENTANYL CITRATE (PF) 100 MCG/2ML IJ SOLN: 25.0000 ug | INTRAMUSCULAR | Status: DC | PRN

## 2017-06-29 MED ORDER — FENTANYL CITRATE (PF) 100 MCG/2ML IJ SOLN
INTRAMUSCULAR | Status: AC
  Filled 2017-06-29: qty 2

## 2017-06-29 MED ORDER — LIDOCAINE 2% (20 MG/ML) 5 ML SYRINGE
INTRAMUSCULAR | Status: AC
  Filled 2017-06-29: qty 5

## 2017-06-29 MED ORDER — DEXAMETHASONE SODIUM PHOSPHATE 10 MG/ML IJ SOLN
INTRAMUSCULAR | Status: DC | PRN
  Administered 2017-06-29: 10 mg via INTRAVENOUS

## 2017-06-29 MED ORDER — PROPOFOL 10 MG/ML IV BOLUS
INTRAVENOUS | Status: DC | PRN
  Administered 2017-06-29: 80 mg via INTRAVENOUS

## 2017-06-29 MED ORDER — ONDANSETRON HCL 4 MG/2ML IJ SOLN
INTRAMUSCULAR | Status: DC | PRN
  Administered 2017-06-29: 4 mg via INTRAVENOUS

## 2017-06-29 MED ORDER — PHENYLEPHRINE HCL 10 MG/ML IJ SOLN
INTRAMUSCULAR | Status: DC | PRN
  Administered 2017-06-29: 120 ug via INTRAVENOUS
  Administered 2017-06-29 (×2): 80 ug via INTRAVENOUS

## 2017-06-29 SURGICAL SUPPLY — 13 items
BAG URO CATCHER STRL LF (MISCELLANEOUS) ×3 IMPLANT
CATH INTERMIT  6FR 70CM (CATHETERS) ×3 IMPLANT
CLOTH BEACON ORANGE TIMEOUT ST (SAFETY) ×3 IMPLANT
COVER FOOTSWITCH UNIV (MISCELLANEOUS) IMPLANT
COVER SURGICAL LIGHT HANDLE (MISCELLANEOUS) IMPLANT
GLOVE BIOGEL M STRL SZ7.5 (GLOVE) ×3 IMPLANT
GOWN STRL REUS W/TWL LRG LVL3 (GOWN DISPOSABLE) ×6 IMPLANT
GUIDEWIRE STR DUAL SENSOR (WIRE) ×3 IMPLANT
MANIFOLD NEPTUNE II (INSTRUMENTS) ×3 IMPLANT
PACK CYSTO (CUSTOM PROCEDURE TRAY) ×3 IMPLANT
STENT URO INLAY 6FRX24CM (STENTS) ×6 IMPLANT
TUBING CONNECTING 10 (TUBING) ×2 IMPLANT
TUBING CONNECTING 10' (TUBING) ×1

## 2017-06-29 NOTE — Anesthesia Procedure Notes (Signed)
Procedure Name: LMA Insertion Date/Time: 06/29/2017 2:55 PM Performed by: Raenette Rover, CRNA Pre-anesthesia Checklist: Patient identified, Suction available, Patient being monitored and Emergency Drugs available Patient Re-evaluated:Patient Re-evaluated prior to induction Oxygen Delivery Method: Circle system utilized Preoxygenation: Pre-oxygenation with 100% oxygen Induction Type: IV induction LMA: LMA inserted LMA Size: 4.0 Number of attempts: 1 Placement Confirmation: positive ETCO2,  CO2 detector and breath sounds checked- equal and bilateral Tube secured with: Tape Dental Injury: Teeth and Oropharynx as per pre-operative assessment

## 2017-06-29 NOTE — Interval H&P Note (Signed)
History and Physical Interval Note:  06/29/2017 2:27 PM  [name]  has presented today for surgery, with the diagnosis of PROSTATE CANCER, BILATERAL URETERAL OBSTRUCTION  The various methods of treatment have been discussed with the patient and family. After consideration of risks, benefits and other options for treatment, the patient has consented to  Procedure(s): CYSTOSCOPY WITH STENT CHANGE (Bilateral) as a surgical intervention .  The patient's history has been reviewed, patient examined, no change in status, stable for surgery.  I have reviewed the patient's chart and labs.  Questions were answered to the patient's satisfaction.     Vola Beneke,LES

## 2017-06-29 NOTE — Anesthesia Preprocedure Evaluation (Addendum)
Anesthesia Evaluation  Patient identified by MRN, date of birth, ID band Patient awake    Reviewed: Allergy & Precautions, H&P , NPO status , Patient's Chart, lab work & pertinent test results  Airway Mallampati: III  TM Distance: >3 FB Neck ROM: Full    Dental no notable dental hx. (+) Edentulous Upper, Dental Advisory Given   Pulmonary asthma , sleep apnea , former smoker,    Pulmonary exam normal breath sounds clear to auscultation       Cardiovascular hypertension, Pt. on medications + dysrhythmias Atrial Fibrillation  Rhythm:Regular Rate:Normal     Neuro/Psych negative neurological ROS  negative psych ROS   GI/Hepatic Neg liver ROS, GERD  Medicated and Controlled,  Endo/Other  diabetes, Insulin Dependent  Renal/GU Renal disease  negative genitourinary   Musculoskeletal  (+) Arthritis , Osteoarthritis,    Abdominal   Peds  Hematology negative hematology ROS (+) anemia ,   Anesthesia Other Findings   Reproductive/Obstetrics negative OB ROS                             Anesthesia Physical  Anesthesia Plan  ASA: III  Anesthesia Plan: General   Post-op Pain Management:    Induction: Intravenous  PONV Risk Score and Plan: 3 and Ondansetron and Treatment may vary due to age or medical condition  Airway Management Planned: LMA  Additional Equipment:   Intra-op Plan:   Post-operative Plan: Extubation in OR  Informed Consent: I have reviewed the patients History and Physical, chart, labs and discussed the procedure including the risks, benefits and alternatives for the proposed anesthesia with the patient or authorized representative who has indicated his/her understanding and acceptance.   Dental advisory given  Plan Discussed with: CRNA  Anesthesia Plan Comments:         Anesthesia Quick Evaluation  

## 2017-06-29 NOTE — Op Note (Signed)
Preoperative diagnosis:  1. Metastatic prostate cancer 2. Bilateral ureteral obstruction   Postoperative diagnosis:  1. Metastatic prostate cancer 2. Bilateral ureteral obstruction   Procedure:  1. Cystoscopy 2. Bilateral ureteral stent placement (6 x 24 Bard Inlay Optima) 3. Bilateral retrograde pyelography  Surgeon: Pryor Curia. M.D.  Anesthesia: General  Complications: None  Intraoperative findings: Bilateral retrograde pyelograms were performed with 6 Fr ureteral catheters and omnipaque contrast.  No hydronephrosis or filling defects were noted.  EBL: Minimal  Specimens: None  Indication: [name] is a 84 y.o. patient with ureteral obstruction. After reviewing the management options for treatment, he elected to proceed with the above surgical procedure(s). We have discussed the potential benefits and risks of the procedure, side effects of the proposed treatment, the likelihood of the patient achieving the goals of the procedure, and any potential problems that might occur during the procedure or recuperation. Informed consent has been obtained.  Description of procedure:  The patient was taken to the operating room and general anesthesia was induced.  The patient was placed in the dorsal lithotomy position, prepped and draped in the usual sterile fashion, and preoperative antibiotics were administered. A preoperative time-out was performed.   Cystourethroscopy was performed.  The patient's urethra was examined and was unremarkable. The bladder was then systematically examined in its entirety. There was no evidence for any bladder tumors, stones, or other mucosal pathology.    Attention then turned to the right ureteral orifice and the patient's indwelling ureteral stent was identified and brought out to the urethral meatus with the flexible graspers.  A 0.38 sensor guidewire was then advanced up the right ureter into the renal pelvis under fluoroscopic  guidance.  The wire was then backloaded through the cystoscope and a ureteral stent was advance over the wire using Seldinger technique.  The stent was positioned appropriately under fluoroscopic and cystoscopic guidance after a retrograde pyelogram was performed to confirm appropriate position in the renal collecting system.  The wire was then removed with an adequate stent curl noted in the renal pelvis as well as in the bladder.  Attention then turned to the left ureteral orifice and the patient's indwelling ureteral stent was identified and brought out to the urethral meatus with the flexible graspers.  A 0.38 sensor guidewire was then advanced up the left ureter into the renal pelvis under fluoroscopic guidance.  The wire was then backloaded through the cystoscope and a ureteral stent was advance over the wire using Seldinger technique.  The stent was positioned appropriately under fluoroscopic and cystoscopic guidance after confirming the appropriate position with a retrograde pyelogram as above.  The wire was then removed with an adequate stent curl noted in the renal pelvis as well as in the bladder.  The bladder was then emptied and the procedure ended.  The patient appeared to tolerate the procedure well and without complications.  The patient was able to be awakened and transferred to the recovery unit in satisfactory condition.    Pryor Curia MD

## 2017-06-29 NOTE — Anesthesia Postprocedure Evaluation (Signed)
Anesthesia Post Note  Patient: [name]  Procedure(s) Performed: CYSTOSCOPY WITH RETROGRADE AND STENT CHANGE (Bilateral )     Patient location during evaluation: PACU Anesthesia Type: General Level of consciousness: awake and alert Pain management: pain level controlled Vital Signs Assessment: post-procedure vital signs reviewed and stable Respiratory status: spontaneous breathing, nonlabored ventilation and respiratory function stable Cardiovascular status: blood pressure returned to baseline and stable Postop Assessment: no apparent nausea or vomiting Anesthetic complications: no    Last Vitals:  Vitals:   06/29/17 1600 06/29/17 1611  BP: (!) 111/59 (!) 114/59  Pulse: 65 69  Resp: 17   Temp:  37 C  SpO2: 96% 100%    Last Pain:  Vitals:   06/29/17 1611  TempSrc: Oral                 Lelan Cush,W. EDMOND

## 2017-06-29 NOTE — Discharge Instructions (Signed)

## 2017-06-29 NOTE — Transfer of Care (Signed)
Immediate Anesthesia Transfer of Care Note  Patient: [name]  Procedure(s) Performed: CYSTOSCOPY WITH RETROGRADE AND STENT CHANGE (Bilateral )  Patient Location: PACU  Anesthesia Type:General  Level of Consciousness: awake, alert , oriented, drowsy and patient cooperative  Airway & Oxygen Therapy: Patient Spontanous Breathing and Patient connected to face mask oxygen  Post-op Assessment: Report given to RN and Post -op Vital signs reviewed and stable  Post vital signs: Reviewed and stable  Last Vitals:  Vitals:   06/29/17 1411  BP: 122/69  Pulse: 68  Resp: 18  Temp: 36.9 C  SpO2: 100%    Last Pain:  Vitals:   06/29/17 1411  TempSrc: Oral      Patients Stated Pain Goal: 4 (41/96/22 2979)  Complications: No apparent anesthesia complications

## 2017-06-30 ENCOUNTER — Encounter (HOSPITAL_COMMUNITY): Payer: Self-pay | Admitting: Urology

## 2017-07-07 DIAGNOSIS — L821 Other seborrheic keratosis: Secondary | ICD-10-CM | POA: Diagnosis not present

## 2017-07-07 DIAGNOSIS — Z85828 Personal history of other malignant neoplasm of skin: Secondary | ICD-10-CM | POA: Diagnosis not present

## 2017-07-07 DIAGNOSIS — L570 Actinic keratosis: Secondary | ICD-10-CM | POA: Diagnosis not present

## 2017-07-11 NOTE — Progress Notes (Signed)
[name]  [phone] Fax:(336) [phone]    ID: [name] OB: 09/18/1931  MR#: [id number]  INO#:[id number]  PCP: Michael Pretty, MD GYN:   SU: [name] OTHER MD: [name], [name],  [name], [name], [name]  CHIEF COMPLAINT: Non-Hodgkin's lymphoma, stage IV prostate cancer  CURRENT TREATMENT: Maintenance rituximab  NON-HODGKIN"S LYMPHOMA HISTORY: From the prior summary:  The patient developed right upper quadrant pain last year and he had an ultrasound April 5th which showed some gallstones without evidence of cholecystitis or ductal dilatation.  However, there were multiple lesions in the liver which could not be assessed further.  Accordingly on July 31, 2003, [name], showed multiple liver masses, more than 25, most over 1 cm, the largest being in the inferior right lobe, measuring 4.2 cm. There was also a small mass in the spleen.  CT of the pelvis was unremarkable.   The patient had a biopsy of the liver August 01, 2003.  The report says only that it was "a lesion in the right lobe of the liver".  Presumably this was the largest lesion present.  The final pathology [id number] and [phone]) showed only cirrhosis.     The patient has been followed by [name], and a repeat CT scan of the abdomen and pelvis was obtained May 18, 2004.  Many of the liver lesions seen previously had actually decreased in size.  However, [name], [name], a second ultrasound-guided biopsy was performed 06/11/04. This was a lesion deep in the left lobe of the liver  and therefore, I would think not the same one previously biopsied which was in the right liver. The pathology this time [id number]) shows a poorly differentiated neuroendocrine carcinoma which was positive for chromogranin A, negative for synaptophysin, thyroid transcription factor, a variety of cytokeratins, [name], alpha-fetoprotein and COX-2."  [name] was subsequently evaluated at [facility] by Dr. Otelia [name] and repeat liver biopsy and review of the earlier biopsy here showed a primary hepatic lymphoma. The patient was treated with R-CHOP as detailed below and achieved a complete response. He received maintenance rituximab until 2008  His subsequent history is as detailed below  INTERVAL HISTORY. [name] returns today for follow-up and maintenance treatment of his non-Hodgkin's lymphoma, accompaned by his daughter. He continues on rituximab every 8 weeks with a dose due today. He tolerates this well. He had hematuria until the day after his bilateral cystoscopy with stent replacment on 06/29/2017.     REVIEW OF SYSTEMS:  [name] reports that he is waiting for good weather to starting working in his garden. He has been losing weight. His energy levels are as high as they used to be. He denies unusual headaches, visual changes, nausea, vomiting, or dizziness. There has been no unusual cough, phlegm production, or pleurisy. This been no change in bowel or bladder habits. He denies unexplained fatigue or unexplained weight loss, bleeding, rash, or fever. A detailed review of systems was otherwise stable.    PAST MEDICAL HISTORY: Past Medical History:  Diagnosis Date  .  A-fib ([place])   . Anemia   . Arthritis   . Asthma    as a kid  . Atrial fibrillation ([place])    caused by atenelol  . Cancer of liver ([place])   . Diabetes mellitus    INSULIN DEPENDENT  type 2  . GERD (gastroesophageal reflux disease)   . Heart murmur    YEARS AGO  . History of kidney stones   . HOH (hard of hearing)   . HX,  PERSONAL, MALIGNANCY, PROSTATE 07/28/2006   Annotation: 2001, resected Qualifier: Diagnosis of  By: Michael Sima MD, [name]    . Hypertension   . Lymphoma ([place])   . Memory deficit 10/18/2013  . Near syncope 10/18/2013  . Neuropathy in diabetes ([place])    Hx: of  . Prostate cancer ([place])   . [name]    with exertion   . Skin cancer   . Sleep apnea    on CPAP - has not used in a long time   Significant for prostate cancer, the patient undergoing prostatectomy January 2001 under [facility] for a Gleason 7, pathologic T3b (positive seminal vesicle involvement) adenocarcinoma with 0 of 2 lymph nodes involved.  Current PSA is followed by Dr. Alinda [name]. Other medical problems include sleep apnea, minor coronary artery disease, hypertension, hypertriglyceridemia, cholelithiasis, colon polyps, cirrhosis by biopsy, diabetes, squamous cell carcinomas of the skin removed by [name], history of nephrolithiasis, status post right renal surgery and history of left rotator cuff repair under [name].    PAST SURGICAL HISTORY: Past Surgical History:  Procedure Laterality Date  . ANKLE SURGERY    . CARDIAC CATHETERIZATION  09/16/96   Normal LV systolic function,dense ca+ prox. portion of the LAD w/50% narrowing in the distal portion, 30-40% irreg. in the proximal portion & 80% narrowing in the ostial portion of the posterolateral branch.  . CHOLECYSTECTOMY  04/04/2013  . CHOLECYSTECTOMY N/A 04/04/2013   Procedure: LAPAROSCOPIC CHOLECYSTECTOMY WITH INTRAOPERATIVE CHOLANGIOGRAM;  Surgeon: Michael Regal, MD;  Location: [place];  Service: General;  [name];  . COLONOSCOPY     Hx: of  . CYSTOSCOPY     with stent exchange Dr. Alinda [name] 06-29-17  . CYSTOSCOPY W/ URETERAL STENT PLACEMENT Bilateral 06/01/2015   Procedure: CYSTOSCOPY WITH BILATERAL STENT REPLACEMENT;  Surgeon: Michael Bring, MD;  Location: [facility];  Service: Urology;  Laterality: Bilateral;  . CYSTOSCOPY W/ URETERAL STENT PLACEMENT  Bilateral 10/29/2015   Procedure: CYSTOSCOPY WITH BILATERAL STENT REPLACEMENT;  Surgeon: Michael Bring, MD;  Location: [facility];  Service: Urology;  Laterality: Bilateral;  . CYSTOSCOPY W/ URETERAL STENT PLACEMENT Bilateral 05/26/2016   Procedure: CYSTO URETEROSCOPY  WITH BILATERAL  STENT REPLACEMENT;  Surgeon: Michael Bring, MD;  Location[facility];  Service: Urology;  Laterality: Bilateral;  . CYSTOSCOPY W/ URETERAL STENT PLACEMENT Bilateral 06/29/2017   Procedure: CYSTOSCOPY WITH RETROGRADE AND STENT CHANGE;  Surgeon: Michael Bring, MD;  Location[facility];  Service: Urology;  Laterality: Bilateral;  . CYSTOSCOPY WITH STENT PLACEMENT Bilateral 02/05/2015   Procedure: CYSTOSCOPY RETROGRADE AND BILATERAL  STENT PLACEMENT;  Surgeon: Kathie Rhodes, MD;  Location[facility];  Service: Urology;  Laterality: Bilateral;  . CYSTOSCOPY WITH STENT PLACEMENT Bilateral 12/01/2016   Procedure: CYSTOSCOPY WITH STENT EXCHANGE;  Surgeon: Michael Bring, MD;  Location[facility];  Service: Urology;  Laterality: Bilateral;  . INFUSION PORT  04/04/2013   RIGHT SUBCLAVIAN  . KIDNEY STONE SURGERY    . MOUTH SURGERY  10/19/2015   left upper teeth  removed along with palate abscess   . PORTACATH PLACEMENT N/A 04/04/2013   Procedure: INSERTION PORT-A-CATH;  Surgeon: Michael Regal, MD;  Location: [place];  Service: General;  [name];  . PROSTATECTOMY    . ROTATOR CUFF REPAIR    . TRANSURETHRAL RESECTION OF BLADDER TUMOR WITH GYRUS (TURBT-GYRUS) N/A 02/05/2015   Procedure: TRANSURETHRAL RESECTION OF BLADDER TUMOR  ;  Surgeon: Kathie Rhodes, MD;  Location[facility];  Service: Urology;  [name];    FAMILY HISTORY Family History  Problem Relation Age of Onset  . Heart disease Father   . Heart attack Father   . Cancer Sister   . Heart attack Brother   . Heart attack Brother   . Heart attack Brother   . Heart attack Brother   . Heart attack Brother   . Cancer Brother   . Cancer Brother   The patient's father died at  the age of 66 from an MI.  The patient's mother died from "old age" at 66.  The patient is one of nine siblings.  One brother died from cancer of the esophagus, 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, [name], a homemaker, died in 2012/06/30: [name] is an Scientist, physiological for [employer]; [name], [name]; and [name] is Environmental consultant.  Everybody lives in [place].  The patient has five grandchildren.  He is a member of [place]. [place].    ADVANCED DIRECTIVES: in place   HEALTH MAINTENANCE: Social History   Tobacco Use  . Smoking status: Former Smoker    Types: Pipe, Landscape architect  . Smokeless tobacco: Former Systems developer    Types: Chew  . Tobacco comment: QUIT SMOKING MANY YEARS AGO "  never much  Substance Use Topics  . Alcohol use: No  . Drug use: No     Colonoscopy:  PSA: Followed by Dr. Alinda [name]  Bone density:  Lipid panel:  Allergies  Allergen Reactions  . Atenolol Other (See Comments)    Slowed heart rate.   . Niacin Other (See Comments)    headaches    Current Outpatient Medications  Medication Sig Dispense Refill  . allopurinol (ZYLOPRIM) 300 MG tablet TAKE 1 TABLET EVERY DAY (Patient taking differently: Take 300 mg by mouth daily) 90 tablet 3  . amLODipine (NORVASC) 2.5 MG tablet Take 2.5 mg by mouth daily.     Marland Kitchen atorvastatin (LIPITOR) 20 MG tablet Take 20 mg by mouth at bedtime.    . Calcium-Magnesium-Vitamin D (CALCIUM 1200+D3 PO) Take 1 tablet by mouth at bedtime.     . cetirizine (ZYRTEC) 10 MG tablet Take 10 mg by mouth daily.    . cholestyramine (QUESTRAN) 4 g packet Take 4 g by mouth 3 (three) times daily as needed (for diarrhea).     .  Cyanocobalamin (VITAMIN B-12) 2500 MCG SUBL Place 2,500 mcg under the tongue daily.    . cyclobenzaprine (FLEXERIL) 10 MG tablet Take 5 mg by mouth 3 (three) times daily as needed for muscle spasms.    . fenofibrate 160 MG tablet Take 160 mg by mouth at bedtime.    . gabapentin (NEURONTIN) 300 MG capsule Take 300 mg by  mouth 3 (three) times daily.     . Insulin Glargine (TOUJEO MAX SOLOSTAR) 300 UNIT/ML SOPN Inject 64 Units into the skin daily.    Marland Kitchen latanoprost (XALATAN) 0.005 % ophthalmic solution Place 1 drop into both eyes at bedtime.    Marland Kitchen leuprolide (LUPRON) 22.5 MG injection Inject 22.5 mg into the muscle every 3 (three) months.    . lidocaine-prilocaine (EMLA) cream Apply 1 application topically as needed. (Patient taking differently: Apply 1 application topically daily as needed (port numbing). ) 30 g 3  . lipase/protease/amylase (CREON) 12000 units CPEP capsule Take 1 capsule (12,000 Units total) by mouth 3 (three) times daily before meals. 270 capsule 6  . magnesium oxide (MAG-OX) 400 (241.3 MG) MG tablet Take 400 tablets by mouth 2 (two) times daily.   4  . metFORMIN (GLUCOPHAGE) 1000 MG tablet Take 1,000 mg by mouth 2 (two) times daily with a meal.     . Multiple Vitamins-Minerals (MULTIVITAMIN WITH MINERALS) tablet Take 1 tablet by mouth daily.    . Rivaroxaban (XARELTO) 15 MG TABS tablet Take 15 mg by mouth at bedtime.     . sertraline (ZOLOFT) 100 MG tablet Take 100 mg by mouth daily.    . vitamin C (ASCORBIC ACID) 500 MG tablet Take 500 mg by mouth daily.     No current facility-administered medications for this visit.    Facility-Administered Medications Ordered in Other Visits  Medication Dose Route Frequency Provider Last Rate Last Dose  . sodium chloride 0.9 % injection 10 mL  10 mL Intracatheter PRN Magrinat, [name], MD   10 mL at 09/09/16 1212  . sodium chloride 0.9 % injection 10 mL  10 mL Intracatheter PRN Magrinat, [name], MD   10 mL at 01/05/17 1313    Objective:  Older white man in no acute distress  Vitals:   07/13/17 0918  BP: (!) 128/48  Pulse: 72  Resp: 18  Temp: 97.8 F (36.6 C)  SpO2: 100%     Body mass index is 27.13 kg/m.     Filed Weights   07/13/17 0918  Weight: 178 lb 6.4 oz (80.9 kg)  we have weights from 10/28/2016, 200 pounds, and 10/17/2016, and 185 pounds.  ECOG FS:1 - Symptomatic but completely ambulatory  Sclerae unicteric, EOMs intact Oropharynx clear and moist No cervical or supraclavicular adenopathy, no axillary or inguinal adenopathy Lungs no rales or rhonchi Heart regular rate and rhythm Abd soft, nontender, positive bowel sounds MSK no focal spinal tenderness, no upper extremity lymphedema Neuro: nonfocal, well oriented, appropriate affect  LAB RESULTS:  CMP     Component Value Date/Time   NA 135 06/23/2017 1016   NA 139 03/21/2017 1351   K 4.7 06/23/2017 1016   K 3.6 03/21/2017 1351   CL 101 06/23/2017 1016   CO2 23 06/23/2017 1016   CO2 23 03/21/2017 1351   GLUCOSE 194 (H) 06/23/2017 1016   GLUCOSE 201 (H) 03/21/2017 1351   BUN 29 (H) 06/23/2017 1016   BUN 25.1 03/21/2017 1351   CREATININE 1.54 (H) 06/23/2017 1016   CREATININE 1.6 (H) 03/21/2017 1351   CALCIUM 9.7 06/23/2017 1016   CALCIUM 9.3 03/21/2017 1351   PROT 6.7 05/19/2017 0916   PROT 6.5 03/21/2017 1351   ALBUMIN 3.9 05/19/2017 0916   ALBUMIN 3.8 03/21/2017 1351   AST 32 05/19/2017 0916   AST 25 03/21/2017 1351   ALT 22 05/19/2017 0916   ALT 23 03/21/2017 1351   ALKPHOS 70 05/19/2017  0916   ALKPHOS 57 03/21/2017 1351   BILITOT 0.3 05/19/2017 0916   BILITOT 0.27 03/21/2017 1351   GFRNONAA 40 (L) 06/23/2017 1016   GFRAA 46 (L) 06/23/2017 1016    No results found for: SPEP  Lab Results  Component Value Date   WBC 6.1 07/13/2017   NEUTROABS 5.0 07/13/2017   HGB 8.4 (L) 07/13/2017   HCT 27.4 (L) 07/13/2017   MCV 75.1 (L) 07/13/2017   PLT 144 07/13/2017      Chemistry      Component Value Date/Time   NA 135 06/23/2017  1016   NA 139 03/21/2017 1351   K 4.7 06/23/2017 1016   K 3.6 03/21/2017 1351   CL 101 06/23/2017 1016   CO2 23 06/23/2017 1016   CO2 23 03/21/2017 1351   BUN 29 (H) 06/23/2017 1016   BUN 25.1 03/21/2017 1351   CREATININE 1.54 (H) 06/23/2017 1016   CREATININE 1.6 (H) 03/21/2017 1351      Component Value Date/Time   CALCIUM 9.7 06/23/2017 1016   CALCIUM 9.3 03/21/2017 1351   ALKPHOS 70 05/19/2017 0916   ALKPHOS 57 03/21/2017 1351   AST 32 05/19/2017 0916   AST 25 03/21/2017 1351   ALT 22 05/19/2017 0916   ALT 23 03/21/2017 1351   BILITOT 0.3 05/19/2017 0916   BILITOT 0.27 03/21/2017 1351      Urinalysis    Component Value Date/Time   COLORURINE YELLOW 02/04/2015 1649   APPEARANCEUR TURBID (A) 02/04/2015 1649   LABSPEC 1.020 04/24/2015 0846   PHURINE 6.0 04/24/2015 0846   PHURINE 5.5 02/04/2015 1649   GLUCOSEU 250 04/24/2015 0846   HGBUR Large 04/24/2015 0846   HGBUR LARGE (A) 02/04/2015 1649   BILIRUBINUR Negative 04/24/2015 0846   KETONESUR Negative 04/24/2015 0846   [place] 02/04/2015 1649   PROTEINUR 30 04/24/2015 0846   PROTEINUR 100 (A) 02/04/2015 1649   UROBILINOGEN 0.2 04/24/2015 0846   NITRITE Negative 04/24/2015 0846   NITRITE POSITIVE (A) 02/04/2015 1649   LEUKOCYTESUR Negative 04/24/2015 0846    STUDIES: We will repeat a PET scan in May of this year  ASSESSMENT:  84 y.o. patient with a diagnosis of    #1 Primary hepatic non-Hodgkin's lymphoma established through liver biopsy February 2006, treated with Rituxan x9 and then CHOP x6.  All chemotherapy completed in 2007.  Maintenance Rituxan discontinued October 2008.     #2  Biopsy proven retroperitoneal recurrence documented November 2014. Bone marrow biopsy 04/01/2013 was negative   #3 status post  laparoscopic cholecystectomy and port placement on 04/04/2013.  #4 Cycle #1  RICE chemotherapy  completed on 05/02/2013.   #5 cycle #2 RICE chemotherapy  on 05/17/2013  #6 PET scan  performed on 05/23/2013 revealed marked partial response to chemotherapy with a retroperitoneal mass decrease in metabolic activity from SUV of 13.2 to 6.3. Right adrenal mass size and metabolic activity is resolved when compared to the previous PET scan.   #7 Evaluated at [facility] by Dr. Tomasa Hosteller for autologous transplant on 05/27/2013 and was quoted a 3% chance of significant heart damage and possibly death from the treatment and a 50% chance of being alive 5 years from now after transplant (versus perhaps 2 years without). After much thought the patient and family have decided firmly they do not wish to proceed to stem cell transplant.  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 treated with vancomycin IV x14  days completed 07/02/2013, followed by doxycycline for 2 additional weeks  #10 started maintenance Rituxan May 2015 (1 dose every 8 weeks)  #11 A-fib, on rivaroxaban  #12 prostate cancer, stage IV-- per Dr [name]  (a) obstructive uropathy secondary to bladder mass (Gleason 9), s/p  L stenting 02/05/2015, exchanged PRN  #13 PET scan 04/29/2016 shows continuing evidence of response, with no significant abnormal hypermetabolic activity to indicate recurrent or active disease  (a) repeat PET scan 02/13/2017 shows a continuing metabolic response I have reviewed the above documentation for accuracy and completeness, and I agree with the above.  #14 iron deficiency anemia:  PLAN: [name] is now 4 years out from his chemotherapy for non-Hodgkin's lymphoma.  There is no evidence of disease activity.  This is very favorable.  He continues on maintenance Rituxan.  He tolerates this well.  He will receive a dose today and again 2 months from now.  The plan is to continue this indefinitely until there is evidence of disease progression.  His MCV is low and he is more anemic than before.  He is iron deficient.  Today we discussed  Feraheme.  He has a good understanding of the possible toxicity side effects and complications of this agent.  As soon as they give [place] some dates I will give him 2 infusions.  This should take care of that problem.  The cause of the iron deficiency of course is very likely his microscopic hematuria and gross hematuria secondary to his prostate cancer problems  He will see me again in May, with his next cycle.  He will have a PET scan prior to that visit.  He knows to call for any other issues that may develop before then.   Magrinat, [name], MD  07/13/17 9:38 AM Medical Oncology and Hematology [facility] [address] [place], [place] [address] Tel. [phone]    Fax. [phone]  This document serves as a record of services personally performed by Lurline Del, MD. It was created on his behalf by Sheron Nightingale, a trained medical scribe. The creation of this record is based on the scribe's personal observations and the provider's statements to them.   I have reviewed the above documentation for accuracy and completeness, and I agree with the above.

## 2017-07-13 ENCOUNTER — Telehealth: Payer: Self-pay | Admitting: Oncology

## 2017-07-13 ENCOUNTER — Inpatient Hospital Stay: Payer: Medicare Other

## 2017-07-13 ENCOUNTER — Inpatient Hospital Stay: Payer: Medicare Other | Attending: Oncology | Admitting: Oncology

## 2017-07-13 VITALS — BP 128/48 | HR 72 | Temp 97.8°F | Resp 18 | Ht 68.0 in | Wt 178.4 lb

## 2017-07-13 VITALS — BP 131/55 | HR 69 | Temp 98.3°F | Resp 18

## 2017-07-13 DIAGNOSIS — C8599 Non-Hodgkin lymphoma, unspecified, extranodal and solid organ sites: Secondary | ICD-10-CM | POA: Insufficient documentation

## 2017-07-13 DIAGNOSIS — Z9221 Personal history of antineoplastic chemotherapy: Secondary | ICD-10-CM

## 2017-07-13 DIAGNOSIS — C61 Malignant neoplasm of prostate: Secondary | ICD-10-CM

## 2017-07-13 DIAGNOSIS — E1110 Type 2 diabetes mellitus with ketoacidosis without coma: Secondary | ICD-10-CM

## 2017-07-13 DIAGNOSIS — D500 Iron deficiency anemia secondary to blood loss (chronic): Secondary | ICD-10-CM

## 2017-07-13 DIAGNOSIS — C8580 Other specified types of non-Hodgkin lymphoma, unspecified site: Secondary | ICD-10-CM

## 2017-07-13 DIAGNOSIS — D696 Thrombocytopenia, unspecified: Secondary | ICD-10-CM | POA: Diagnosis not present

## 2017-07-13 DIAGNOSIS — I480 Paroxysmal atrial fibrillation: Secondary | ICD-10-CM | POA: Diagnosis not present

## 2017-07-13 DIAGNOSIS — C8203 Follicular lymphoma grade I, intra-abdominal lymph nodes: Secondary | ICD-10-CM

## 2017-07-13 DIAGNOSIS — C8593 Non-Hodgkin lymphoma, unspecified, intra-abdominal lymph nodes: Secondary | ICD-10-CM | POA: Diagnosis not present

## 2017-07-13 DIAGNOSIS — N189 Chronic kidney disease, unspecified: Secondary | ICD-10-CM

## 2017-07-13 DIAGNOSIS — D509 Iron deficiency anemia, unspecified: Secondary | ICD-10-CM | POA: Diagnosis not present

## 2017-07-13 DIAGNOSIS — R310 Gross hematuria: Secondary | ICD-10-CM | POA: Diagnosis not present

## 2017-07-13 DIAGNOSIS — Z5112 Encounter for antineoplastic immunotherapy: Secondary | ICD-10-CM | POA: Diagnosis not present

## 2017-07-13 DIAGNOSIS — C8590 Non-Hodgkin lymphoma, unspecified, unspecified site: Secondary | ICD-10-CM

## 2017-07-13 DIAGNOSIS — N179 Acute kidney failure, unspecified: Secondary | ICD-10-CM

## 2017-07-13 DIAGNOSIS — C8306 Small cell B-cell lymphoma, intrapelvic lymph nodes: Secondary | ICD-10-CM

## 2017-07-13 DIAGNOSIS — Z95828 Presence of other vascular implants and grafts: Secondary | ICD-10-CM

## 2017-07-13 DIAGNOSIS — C8219 Follicular lymphoma grade II, extranodal and solid organ sites: Secondary | ICD-10-CM

## 2017-07-13 LAB — COMPREHENSIVE METABOLIC PANEL
ALBUMIN: 3.8 g/dL (ref 3.5–5.0)
ALT: 17 U/L (ref 0–55)
AST: 22 U/L (ref 5–34)
Alkaline Phosphatase: 56 U/L (ref 40–150)
Anion gap: 11 (ref 3–11)
BILIRUBIN TOTAL: 0.5 mg/dL (ref 0.2–1.2)
BUN: 16 mg/dL (ref 7–26)
CALCIUM: 9.6 mg/dL (ref 8.4–10.4)
CO2: 20 mmol/L — ABNORMAL LOW (ref 22–29)
CREATININE: 1.57 mg/dL — AB (ref 0.70–1.30)
Chloride: 103 mmol/L (ref 98–109)
GFR calc Af Amer: 45 mL/min — ABNORMAL LOW (ref >60)
GFR calc non Af Amer: 39 mL/min — ABNORMAL LOW (ref >60)
GLUCOSE: 265 mg/dL — AB (ref 70–140)
Potassium: 4.3 mmol/L (ref 3.5–5.1)
Sodium: 134 mmol/L — ABNORMAL LOW (ref 136–145)
TOTAL PROTEIN: 6.6 g/dL (ref 6.4–8.3)

## 2017-07-13 LAB — CBC WITH DIFFERENTIAL/PLATELET
BASOS ABS: 0 10*3/uL (ref 0.0–0.1)
Basophils Relative: 0 %
EOS PCT: 4 %
Eosinophils Absolute: 0.2 10*3/uL (ref 0.0–0.5)
HCT: 27.4 % — ABNORMAL LOW (ref 38.4–49.9)
Hemoglobin: 8.4 g/dL — ABNORMAL LOW (ref 13.0–17.1)
LYMPHS PCT: 7 %
Lymphs Abs: 0.4 10*3/uL — ABNORMAL LOW (ref 0.9–3.3)
MCH: 23 pg — ABNORMAL LOW (ref 27.2–33.4)
MCHC: 30.7 g/dL — ABNORMAL LOW (ref 32.0–36.0)
MCV: 75.1 fL — AB (ref 79.3–98.0)
MONO ABS: 0.4 10*3/uL (ref 0.1–0.9)
Monocytes Relative: 7 %
Neutro Abs: 5 10*3/uL (ref 1.5–6.5)
Neutrophils Relative %: 82 %
PLATELETS: 144 10*3/uL (ref 140–400)
RBC: 3.65 MIL/uL — ABNORMAL LOW (ref 4.20–5.82)
RDW: 17.2 % — AB (ref 11.0–14.6)
WBC: 6.1 10*3/uL (ref 4.0–10.3)

## 2017-07-13 LAB — LACTATE DEHYDROGENASE: LDH: 160 U/L (ref 125–245)

## 2017-07-13 MED ORDER — SODIUM CHLORIDE 0.9 % IV SOLN
Freq: Once | INTRAVENOUS | Status: AC
  Administered 2017-07-13: 11:00:00 via INTRAVENOUS

## 2017-07-13 MED ORDER — DIPHENHYDRAMINE HCL 25 MG PO CAPS
ORAL_CAPSULE | ORAL | Status: AC
  Filled 2017-07-13: qty 1

## 2017-07-13 MED ORDER — HEPARIN SOD (PORK) LOCK FLUSH 100 UNIT/ML IV SOLN
500.0000 [IU] | Freq: Once | INTRAVENOUS | Status: AC | PRN
  Administered 2017-07-13: 500 [IU]
  Filled 2017-07-13: qty 5

## 2017-07-13 MED ORDER — SODIUM CHLORIDE 0.9 % IJ SOLN
10.0000 mL | INTRAMUSCULAR | Status: DC | PRN
  Administered 2017-07-13: 10 mL
  Filled 2017-07-13: qty 10

## 2017-07-13 MED ORDER — SODIUM CHLORIDE 0.9 % IJ SOLN
10.0000 mL | INTRAMUSCULAR | Status: DC | PRN
  Administered 2017-07-13: 10 mL via INTRAVENOUS
  Filled 2017-07-13: qty 10

## 2017-07-13 MED ORDER — SODIUM CHLORIDE 0.9 % IV SOLN
350.0000 mg/m2 | Freq: Once | INTRAVENOUS | Status: AC
  Administered 2017-07-13: 700 mg via INTRAVENOUS
  Filled 2017-07-13: qty 70

## 2017-07-13 MED ORDER — DIPHENHYDRAMINE HCL 25 MG PO CAPS
25.0000 mg | ORAL_CAPSULE | Freq: Once | ORAL | Status: AC
  Administered 2017-07-13: 25 mg via ORAL

## 2017-07-13 MED ORDER — ACETAMINOPHEN 325 MG PO TABS
650.0000 mg | ORAL_TABLET | Freq: Once | ORAL | Status: AC
  Administered 2017-07-13: 650 mg via ORAL

## 2017-07-13 MED ORDER — ACETAMINOPHEN 325 MG PO TABS
ORAL_TABLET | ORAL | Status: AC
  Filled 2017-07-13: qty 2

## 2017-07-13 NOTE — Patient Instructions (Signed)
[facility] Discharge Instructions for Patients Receiving Chemotherapy  Today you received the following chemotherapy agents:  Rituxan.  To help prevent nausea and vomiting after your treatment, we encourage you to take your nausea medication as directed.   If you develop nausea and vomiting that is not controlled by your nausea medication, call the clinic.   BELOW ARE SYMPTOMS THAT SHOULD BE REPORTED IMMEDIATELY:  *FEVER GREATER THAN 100.5 F  *CHILLS WITH OR WITHOUT FEVER  NAUSEA AND VOMITING THAT IS NOT CONTROLLED WITH YOUR NAUSEA MEDICATION  *UNUSUAL [name]  *UNUSUAL BRUISING OR BLEEDING  TENDERNESS IN MOUTH AND THROAT WITH OR WITHOUT PRESENCE OF ULCERS  *URINARY PROBLEMS  *BOWEL PROBLEMS  UNUSUAL RASH Items with * indicate a potential emergency and should be followed up as soon as possible.  Feel free to call the clinic should you have any questions or concerns. The clinic phone number is [phone].  Please show the CHEMO ALERT CARD at check-in to the Emergency Department and triage nurse.   

## 2017-07-13 NOTE — Telephone Encounter (Signed)
Gave patients daughter AVs and calendar of upcoming May appointments

## 2017-07-21 DIAGNOSIS — H52223 Regular astigmatism, bilateral: Secondary | ICD-10-CM | POA: Diagnosis not present

## 2017-07-21 DIAGNOSIS — E113293 Type 2 diabetes mellitus with mild nonproliferative diabetic retinopathy without macular edema, bilateral: Secondary | ICD-10-CM | POA: Diagnosis not present

## 2017-07-21 DIAGNOSIS — Z9841 Cataract extraction status, right eye: Secondary | ICD-10-CM | POA: Diagnosis not present

## 2017-07-21 DIAGNOSIS — Z9842 Cataract extraction status, left eye: Secondary | ICD-10-CM | POA: Diagnosis not present

## 2017-07-21 DIAGNOSIS — H40023 Open angle with borderline findings, high risk, bilateral: Secondary | ICD-10-CM | POA: Diagnosis not present

## 2017-08-03 ENCOUNTER — Encounter: Payer: Self-pay | Admitting: Oncology

## 2017-08-07 ENCOUNTER — Inpatient Hospital Stay: Payer: Medicare Other | Attending: Oncology

## 2017-08-07 ENCOUNTER — Other Ambulatory Visit: Payer: Self-pay | Admitting: Oncology

## 2017-08-07 VITALS — BP 136/55 | HR 72 | Temp 97.9°F | Resp 17

## 2017-08-07 DIAGNOSIS — N189 Chronic kidney disease, unspecified: Secondary | ICD-10-CM | POA: Diagnosis not present

## 2017-08-07 DIAGNOSIS — Z95828 Presence of other vascular implants and grafts: Secondary | ICD-10-CM

## 2017-08-07 DIAGNOSIS — C8593 Non-Hodgkin lymphoma, unspecified, intra-abdominal lymph nodes: Secondary | ICD-10-CM | POA: Diagnosis not present

## 2017-08-07 DIAGNOSIS — D631 Anemia in chronic kidney disease: Secondary | ICD-10-CM | POA: Diagnosis not present

## 2017-08-07 DIAGNOSIS — C61 Malignant neoplasm of prostate: Secondary | ICD-10-CM

## 2017-08-07 DIAGNOSIS — N183 Chronic kidney disease, stage 3 unspecified: Secondary | ICD-10-CM

## 2017-08-07 MED ORDER — HEPARIN SOD (PORK) LOCK FLUSH 100 UNIT/ML IV SOLN
500.0000 [IU] | Freq: Once | INTRAVENOUS | Status: AC | PRN
  Administered 2017-08-07: 500 [IU] via INTRAVENOUS
  Filled 2017-08-07: qty 5

## 2017-08-07 MED ORDER — SODIUM CHLORIDE 0.9 % IV SOLN
510.0000 mg | Freq: Once | INTRAVENOUS | Status: AC
  Administered 2017-08-07: 510 mg via INTRAVENOUS
  Filled 2017-08-07: qty 17

## 2017-08-07 MED ORDER — SODIUM CHLORIDE 0.9 % IJ SOLN
10.0000 mL | INTRAMUSCULAR | Status: DC | PRN
  Administered 2017-08-07: 10 mL via INTRAVENOUS
  Filled 2017-08-07: qty 10

## 2017-08-07 MED ORDER — SODIUM CHLORIDE 0.9 % IV SOLN
Freq: Once | INTRAVENOUS | Status: AC
  Administered 2017-08-07: 09:00:00 via INTRAVENOUS

## 2017-08-07 NOTE — Patient Instructions (Signed)

## 2017-08-17 ENCOUNTER — Inpatient Hospital Stay: Payer: Medicare Other

## 2017-08-17 VITALS — BP 121/52 | HR 60 | Temp 97.6°F | Resp 16

## 2017-08-17 DIAGNOSIS — N189 Chronic kidney disease, unspecified: Secondary | ICD-10-CM | POA: Diagnosis not present

## 2017-08-17 DIAGNOSIS — E1121 Type 2 diabetes mellitus with diabetic nephropathy: Secondary | ICD-10-CM | POA: Diagnosis not present

## 2017-08-17 DIAGNOSIS — D631 Anemia in chronic kidney disease: Secondary | ICD-10-CM | POA: Diagnosis not present

## 2017-08-17 DIAGNOSIS — C61 Malignant neoplasm of prostate: Secondary | ICD-10-CM

## 2017-08-17 DIAGNOSIS — E782 Mixed hyperlipidemia: Secondary | ICD-10-CM | POA: Diagnosis not present

## 2017-08-17 DIAGNOSIS — N183 Chronic kidney disease, stage 3 unspecified: Secondary | ICD-10-CM

## 2017-08-17 DIAGNOSIS — E119 Type 2 diabetes mellitus without complications: Secondary | ICD-10-CM | POA: Diagnosis not present

## 2017-08-17 DIAGNOSIS — Z95828 Presence of other vascular implants and grafts: Secondary | ICD-10-CM

## 2017-08-17 DIAGNOSIS — C8593 Non-Hodgkin lymphoma, unspecified, intra-abdominal lymph nodes: Secondary | ICD-10-CM | POA: Diagnosis not present

## 2017-08-17 MED ORDER — ALTEPLASE 2 MG IJ SOLR
2.0000 mg | Freq: Once | INTRAMUSCULAR | Status: DC | PRN
  Filled 2017-08-17: qty 2

## 2017-08-17 MED ORDER — HEPARIN SOD (PORK) LOCK FLUSH 100 UNIT/ML IV SOLN
500.0000 [IU] | Freq: Once | INTRAVENOUS | Status: AC | PRN
  Administered 2017-08-17: 500 [IU] via INTRAVENOUS
  Filled 2017-08-17: qty 5

## 2017-08-17 MED ORDER — ACETAMINOPHEN 325 MG PO TABS
ORAL_TABLET | ORAL | Status: AC
  Filled 2017-08-17: qty 2

## 2017-08-17 MED ORDER — FERUMOXYTOL INJECTION 510 MG/17 ML
510.0000 mg | Freq: Once | INTRAVENOUS | Status: AC
  Administered 2017-08-17: 510 mg via INTRAVENOUS
  Filled 2017-08-17: qty 17

## 2017-08-17 MED ORDER — DIPHENHYDRAMINE HCL 25 MG PO CAPS
ORAL_CAPSULE | ORAL | Status: AC
Start: 2017-08-17 — End: 2017-08-17
  Filled 2017-08-17: qty 1

## 2017-08-17 MED ORDER — SODIUM CHLORIDE 0.9 % IV SOLN
Freq: Once | INTRAVENOUS | Status: AC
  Administered 2017-08-17: 09:00:00 via INTRAVENOUS

## 2017-08-17 MED ORDER — ACETAMINOPHEN 325 MG PO TABS
650.0000 mg | ORAL_TABLET | Freq: Once | ORAL | Status: AC
  Administered 2017-08-17: 650 mg via ORAL

## 2017-08-17 MED ORDER — SODIUM CHLORIDE 0.9 % IJ SOLN
10.0000 mL | INTRAMUSCULAR | Status: DC | PRN
  Administered 2017-08-17: 10 mL via INTRAVENOUS
  Filled 2017-08-17: qty 10

## 2017-08-17 MED ORDER — DIPHENHYDRAMINE HCL 25 MG PO TABS
25.0000 mg | ORAL_TABLET | Freq: Once | ORAL | Status: AC
  Administered 2017-08-17: 25 mg via ORAL
  Filled 2017-08-17: qty 1

## 2017-08-17 NOTE — Patient Instructions (Signed)

## 2017-08-25 DIAGNOSIS — I10 Essential (primary) hypertension: Secondary | ICD-10-CM | POA: Diagnosis not present

## 2017-08-25 DIAGNOSIS — E782 Mixed hyperlipidemia: Secondary | ICD-10-CM | POA: Diagnosis not present

## 2017-08-25 DIAGNOSIS — E119 Type 2 diabetes mellitus without complications: Secondary | ICD-10-CM | POA: Diagnosis not present

## 2017-09-06 ENCOUNTER — Ambulatory Visit (HOSPITAL_COMMUNITY)
Admission: RE | Admit: 2017-09-06 | Discharge: 2017-09-06 | Disposition: A | Discharge status: Home / Self Care | Payer: Medicare Other | Source: Ambulatory Visit | Attending: Oncology | Admitting: Oncology

## 2017-09-06 DIAGNOSIS — I700 Atherosclerosis of aorta: Secondary | ICD-10-CM | POA: Insufficient documentation

## 2017-09-06 DIAGNOSIS — N179 Acute kidney failure, unspecified: Secondary | ICD-10-CM | POA: Insufficient documentation

## 2017-09-06 DIAGNOSIS — C61 Malignant neoplasm of prostate: Secondary | ICD-10-CM | POA: Insufficient documentation

## 2017-09-06 DIAGNOSIS — M47816 Spondylosis without myelopathy or radiculopathy, lumbar region: Secondary | ICD-10-CM | POA: Insufficient documentation

## 2017-09-06 DIAGNOSIS — D696 Thrombocytopenia, unspecified: Secondary | ICD-10-CM | POA: Diagnosis not present

## 2017-09-06 DIAGNOSIS — C8580 Other specified types of non-Hodgkin lymphoma, unspecified site: Secondary | ICD-10-CM | POA: Diagnosis not present

## 2017-09-06 DIAGNOSIS — N189 Chronic kidney disease, unspecified: Secondary | ICD-10-CM | POA: Diagnosis not present

## 2017-09-06 DIAGNOSIS — N200 Calculus of kidney: Secondary | ICD-10-CM | POA: Diagnosis not present

## 2017-09-06 DIAGNOSIS — E1122 Type 2 diabetes mellitus with diabetic chronic kidney disease: Secondary | ICD-10-CM | POA: Diagnosis not present

## 2017-09-06 DIAGNOSIS — E1110 Type 2 diabetes mellitus with ketoacidosis without coma: Secondary | ICD-10-CM | POA: Diagnosis not present

## 2017-09-06 DIAGNOSIS — Z9049 Acquired absence of other specified parts of digestive tract: Secondary | ICD-10-CM | POA: Insufficient documentation

## 2017-09-06 DIAGNOSIS — K769 Liver disease, unspecified: Secondary | ICD-10-CM | POA: Diagnosis not present

## 2017-09-06 DIAGNOSIS — I517 Cardiomegaly: Secondary | ICD-10-CM | POA: Insufficient documentation

## 2017-09-06 DIAGNOSIS — I480 Paroxysmal atrial fibrillation: Secondary | ICD-10-CM | POA: Insufficient documentation

## 2017-09-06 DIAGNOSIS — M5136 Other intervertebral disc degeneration, lumbar region: Secondary | ICD-10-CM | POA: Diagnosis not present

## 2017-09-06 DIAGNOSIS — C8306 Small cell B-cell lymphoma, intrapelvic lymph nodes: Secondary | ICD-10-CM | POA: Diagnosis not present

## 2017-09-06 DIAGNOSIS — J984 Other disorders of lung: Secondary | ICD-10-CM | POA: Diagnosis not present

## 2017-09-06 DIAGNOSIS — R162 Hepatomegaly with splenomegaly, not elsewhere classified: Secondary | ICD-10-CM | POA: Insufficient documentation

## 2017-09-06 LAB — GLUCOSE, CAPILLARY: Glucose-Capillary: 123 mg/dL — ABNORMAL HIGH (ref 65–99)

## 2017-09-06 MED ORDER — FLUDEOXYGLUCOSE F - 18 (FDG) INJECTION: 8.9000 | Freq: Once | INTRAVENOUS | Status: DC | PRN

## 2017-09-07 ENCOUNTER — Inpatient Hospital Stay (HOSPITAL_BASED_OUTPATIENT_CLINIC_OR_DEPARTMENT_OTHER): Payer: Medicare Other | Admitting: Oncology

## 2017-09-07 ENCOUNTER — Inpatient Hospital Stay: Payer: Medicare Other | Attending: Oncology

## 2017-09-07 ENCOUNTER — Inpatient Hospital Stay: Payer: Medicare Other

## 2017-09-07 ENCOUNTER — Telehealth: Payer: Self-pay | Admitting: Oncology

## 2017-09-07 VITALS — BP 117/51 | HR 58 | Temp 98.0°F | Resp 16

## 2017-09-07 VITALS — BP 116/51 | HR 69 | Temp 97.8°F | Resp 18 | Ht 68.0 in | Wt 174.0 lb

## 2017-09-07 DIAGNOSIS — C8209 Follicular lymphoma grade I, extranodal and solid organ sites: Secondary | ICD-10-CM

## 2017-09-07 DIAGNOSIS — C61 Malignant neoplasm of prostate: Secondary | ICD-10-CM

## 2017-09-07 DIAGNOSIS — Z79899 Other long term (current) drug therapy: Secondary | ICD-10-CM | POA: Insufficient documentation

## 2017-09-07 DIAGNOSIS — Z9221 Personal history of antineoplastic chemotherapy: Secondary | ICD-10-CM | POA: Insufficient documentation

## 2017-09-07 DIAGNOSIS — I700 Atherosclerosis of aorta: Secondary | ICD-10-CM | POA: Diagnosis not present

## 2017-09-07 DIAGNOSIS — C8219 Follicular lymphoma grade II, extranodal and solid organ sites: Secondary | ICD-10-CM

## 2017-09-07 DIAGNOSIS — Z5112 Encounter for antineoplastic immunotherapy: Secondary | ICD-10-CM | POA: Diagnosis not present

## 2017-09-07 DIAGNOSIS — Z87891 Personal history of nicotine dependence: Secondary | ICD-10-CM | POA: Diagnosis not present

## 2017-09-07 DIAGNOSIS — Z95828 Presence of other vascular implants and grafts: Secondary | ICD-10-CM

## 2017-09-07 DIAGNOSIS — C8593 Non-Hodgkin lymphoma, unspecified, intra-abdominal lymph nodes: Secondary | ICD-10-CM | POA: Insufficient documentation

## 2017-09-07 DIAGNOSIS — D509 Iron deficiency anemia, unspecified: Secondary | ICD-10-CM | POA: Insufficient documentation

## 2017-09-07 DIAGNOSIS — C8203 Follicular lymphoma grade I, intra-abdominal lymph nodes: Secondary | ICD-10-CM

## 2017-09-07 DIAGNOSIS — N183 Chronic kidney disease, stage 3 unspecified: Secondary | ICD-10-CM

## 2017-09-07 DIAGNOSIS — C8590 Non-Hodgkin lymphoma, unspecified, unspecified site: Secondary | ICD-10-CM

## 2017-09-07 DIAGNOSIS — C8580 Other specified types of non-Hodgkin lymphoma, unspecified site: Secondary | ICD-10-CM

## 2017-09-07 DIAGNOSIS — D630 Anemia in neoplastic disease: Secondary | ICD-10-CM

## 2017-09-07 LAB — CBC WITH DIFFERENTIAL/PLATELET
BASOS ABS: 0 10*3/uL (ref 0.0–0.1)
BASOS PCT: 0 %
EOS PCT: 6 %
Eosinophils Absolute: 0.3 10*3/uL (ref 0.0–0.5)
HCT: 35.4 % — ABNORMAL LOW (ref 38.4–49.9)
Hemoglobin: 10.9 g/dL — ABNORMAL LOW (ref 13.0–17.1)
Lymphocytes Relative: 7 %
Lymphs Abs: 0.4 10*3/uL — ABNORMAL LOW (ref 0.9–3.3)
MCH: 25.3 pg — ABNORMAL LOW (ref 27.2–33.4)
MCHC: 30.8 g/dL — ABNORMAL LOW (ref 32.0–36.0)
MCV: 82.1 fL (ref 79.3–98.0)
MONO ABS: 0.3 10*3/uL (ref 0.1–0.9)
Monocytes Relative: 6 %
Neutro Abs: 4.3 10*3/uL (ref 1.5–6.5)
Neutrophils Relative %: 81 %
PLATELETS: 150 10*3/uL (ref 140–400)
RBC: 4.31 MIL/uL (ref 4.20–5.82)
RDW: 23.9 % — AB (ref 11.0–14.6)
WBC: 5.3 10*3/uL (ref 4.0–10.3)

## 2017-09-07 LAB — COMPREHENSIVE METABOLIC PANEL
ALBUMIN: 4.2 g/dL (ref 3.5–5.0)
ALT: 22 U/L (ref 0–55)
AST: 28 U/L (ref 5–34)
Alkaline Phosphatase: 58 U/L (ref 40–150)
Anion gap: 9 (ref 5–15)
BUN: 32 mg/dL — AB (ref 7–26)
CHLORIDE: 108 mmol/L (ref 98–109)
CO2: 23 mmol/L (ref 22–29)
Calcium: 9.8 mg/dL (ref 8.4–10.4)
Creatinine, Ser: 1.41 mg/dL — ABNORMAL HIGH (ref 0.70–1.30)
GFR calc Af Amer: 52 mL/min — ABNORMAL LOW (ref >60)
GFR calc non Af Amer: 44 mL/min — ABNORMAL LOW (ref >60)
GLUCOSE: 125 mg/dL (ref 70–140)
POTASSIUM: 4.1 mmol/L (ref 3.5–5.1)
SODIUM: 140 mmol/L (ref 136–145)
Total Bilirubin: 0.4 mg/dL (ref 0.2–1.2)
Total Protein: 6.8 g/dL (ref 6.4–8.3)

## 2017-09-07 LAB — LACTATE DEHYDROGENASE: LDH: 153 U/L (ref 125–245)

## 2017-09-07 MED ORDER — DIPHENHYDRAMINE HCL 25 MG PO CAPS
25.0000 mg | ORAL_CAPSULE | Freq: Once | ORAL | Status: AC
  Administered 2017-09-07: 25 mg via ORAL

## 2017-09-07 MED ORDER — DIPHENHYDRAMINE HCL 25 MG PO CAPS
ORAL_CAPSULE | ORAL | Status: AC
  Filled 2017-09-07: qty 1

## 2017-09-07 MED ORDER — SODIUM CHLORIDE 0.9 % IV SOLN
Freq: Once | INTRAVENOUS | Status: AC
  Administered 2017-09-07: 10:00:00 via INTRAVENOUS

## 2017-09-07 MED ORDER — ACETAMINOPHEN 325 MG PO TABS
650.0000 mg | ORAL_TABLET | Freq: Once | ORAL | Status: AC
  Administered 2017-09-07: 650 mg via ORAL

## 2017-09-07 MED ORDER — HEPARIN SOD (PORK) LOCK FLUSH 100 UNIT/ML IV SOLN
500.0000 [IU] | Freq: Once | INTRAVENOUS | Status: AC | PRN
  Administered 2017-09-07: 500 [IU]
  Filled 2017-09-07: qty 5

## 2017-09-07 MED ORDER — SODIUM CHLORIDE 0.9 % IJ SOLN
10.0000 mL | INTRAMUSCULAR | Status: DC | PRN
  Administered 2017-09-07: 10 mL
  Filled 2017-09-07: qty 10

## 2017-09-07 MED ORDER — SODIUM CHLORIDE 0.9 % IJ SOLN
10.0000 mL | INTRAMUSCULAR | Status: DC | PRN
  Administered 2017-09-07: 10 mL via INTRAVENOUS
  Filled 2017-09-07: qty 10

## 2017-09-07 MED ORDER — RITUXIMAB CHEMO INJECTION 500 MG/50ML
350.0000 mg/m2 | Freq: Once | INTRAVENOUS | Status: AC
  Administered 2017-09-07: 700 mg via INTRAVENOUS
  Filled 2017-09-07: qty 50

## 2017-09-07 MED ORDER — ACETAMINOPHEN 325 MG PO TABS
ORAL_TABLET | ORAL | Status: AC
  Filled 2017-09-07: qty 2

## 2017-09-07 NOTE — Telephone Encounter (Signed)
Gave patient/relative avs report and appointments for July thru December. Central radiology will call re mri.

## 2017-09-07 NOTE — Patient Instructions (Signed)
[facility] Discharge Instructions for Patients Receiving Chemotherapy  Today you received the following chemotherapy agents:  Rituxan.  To help prevent nausea and vomiting after your treatment, we encourage you to take your nausea medication as directed.   If you develop nausea and vomiting that is not controlled by your nausea medication, call the clinic.   BELOW ARE SYMPTOMS THAT SHOULD BE REPORTED IMMEDIATELY:  *FEVER GREATER THAN 100.5 F  *CHILLS WITH OR WITHOUT FEVER  NAUSEA AND VOMITING THAT IS NOT CONTROLLED WITH YOUR NAUSEA MEDICATION  *UNUSUAL [name]  *UNUSUAL BRUISING OR BLEEDING  TENDERNESS IN MOUTH AND THROAT WITH OR WITHOUT PRESENCE OF ULCERS  *URINARY PROBLEMS  *BOWEL PROBLEMS  UNUSUAL RASH Items with * indicate a potential emergency and should be followed up as soon as possible.  Feel free to call the clinic should you have any questions or concerns. The clinic phone number is [phone].  Please show the CHEMO ALERT CARD at check-in to the Emergency Department and triage nurse.   

## 2017-09-07 NOTE — Progress Notes (Signed)
[place]  [phone] Fax:([phone]    ID: [name] OB: [birth year]  MR#: [id number]  MQK#:[id number]   Patient Care Team: [name], MD as PCP - General (Internal Medicine) Alura Olveda, [name], MD as Consulting Physician (Oncology) Raynelle Bring, MD as Consulting Physician (Urology) [name], MD as Consulting Physician (General Surgery) Croitoru, Dani Gobble, MD as Consulting Physician (Cardiology) Tomasa Hosteller Rayvon Char, MD as Referring Physician (Hematology and Oncology)  Call daughter with appts as she is assisting with transportation(per [name]) [name] [phone]   CHIEF COMPLAINT: Non-Hodgkin's lymphoma, stage IV prostate cancer  CURRENT TREATMENT: Maintenance rituximab  NON-HODGKIN"S LYMPHOMA HISTORY: From the prior summary:  The patient developed right upper quadrant pain last year and he had an ultrasound April 5th which showed some gallstones without evidence of cholecystitis or ductal dilatation.  However, there were multiple lesions in the liver which could not be assessed further.  Accordingly on July 31, 2003, [name], showed multiple liver masses, more than 25, most over 1 cm, the largest being in the inferior right lobe, measuring 4.2 cm. There was also a small mass in the spleen.  CT of the pelvis was unremarkable.   The patient had a biopsy of the liver August 01, 2003.  The report says only that it was "a lesion in the right lobe of the liver".  Presumably this was the largest lesion present.  The final pathology [id number] and [id number]) showed only cirrhosis.     The patient has been followed by [name], and a repeat CT scan of the abdomen and pelvis was obtained May 18, 2004.  Many of the liver lesions seen previously had actually decreased in size.  However, the lesion in the posterior aspect of the lateral segment of the left liver had grown to 7.3 cm.  Previously it had  measured 2.6 cm.  Although it says that no focal abnormalities are seen in the spleen, [name], a second ultrasound-guided biopsy was performed 06/11/04. This was a lesion deep in the left lobe of the liver and therefore, I would think not the same one previously biopsied which was in the right liver. The pathology this time [id number]) shows a poorly differentiated neuroendocrine carcinoma which was positive for chromogranin A, negative for synaptophysin, thyroid transcription factor, a variety of cytokeratins, [name], alpha-fetoprotein and COX-2."  [name] was subsequently evaluated at [facility] by Dr. Otelia Limes and repeat liver biopsy and review of the earlier biopsy here showed a primary hepatic lymphoma. The patient was treated with R-CHOP as detailed below and achieved a complete response. He received maintenance rituximab until 2008  His subsequent history is as detailed below  INTERVAL HISTORY. [name] returns today for follow-up and maintenance treatment of his non-Hodgkin's lymphoma, accompaned by 1 of his daughters, with his daughter [name] participating through phone  He continues on rituximab every 8 weeks with a dose due today.  He is tolerating treatments with no side effects that he is aware of  Since his last visit here he underwent a PET on 09/06/2017, showing small retrocrural density on the right is similar in size to the prior exam, with a maximum SUV of 2.6 currently and previously 2.3. This is currently Deauville 3. There is an enlarging hypodense lesion in the dome of  the right hepatic lobe with a metabolic activity similar to the surrounding liver. This may well be an enlarging benign lesions such as hemangioma, but given the progressive enlargement, hepatic MRI with an without contrast would be suggested for definitive characterization. Mild  splenomegaly but without abnormal splenic activity. Other imaging findings of potential clinical significance: Aortic Atherosclerosis (ICD10-I70.0). Mild cardiomegaly. Stable bibasilar scarring. Densely calcified lateral segment left hepatic lobe mass is chronically stable and relatively hypoactive. Bilateral nonobstructive nephrolithiasis with bilateral double-J ureteral stents in place. Lower lumbar spondylosis and degenerative disc disease.      REVIEW OF SYSTEMS:  [name] is doing well overall. He is staying busy with his vegatble garden. He continues to use MiraLAX to help keep his bowel movements regular. He denies unusual headaches, visual changes, nausea, vomiting, or dizziness. There has been no unusual cough, phlegm production, or pleurisy. This been no change in bowel or bladder habits. He denies unexplained fatigue or unexplained weight loss, bleeding, rash, or fever. A detailed review of systems was otherwise noncontributory.    PAST MEDICAL HISTORY: Past Medical History:  Diagnosis Date  . A-fib ([place])   . Anemia   . Arthritis   . Asthma    as a kid  . Atrial fibrillation ([place])    caused by atenelol  . Cancer of liver ([place])   . Diabetes mellitus    INSULIN DEPENDENT  type 2  . GERD (gastroesophageal reflux disease)   . Heart murmur    YEARS AGO  . History of kidney stones   . HOH (hard of hearing)   . HX, PERSONAL, MALIGNANCY, PROSTATE 07/28/2006   Annotation: 2001, resected Qualifier: Diagnosis of  By: [name], [name]    . Hypertension   . Lymphoma ([place])   . Memory deficit 10/18/2013  . Near syncope 10/18/2013  . Neuropathy in diabetes ([place])    Hx: of  . Prostate cancer ([place])   . [name]    with exertion   . Skin cancer   . Sleep apnea    on CPAP - has not used in a long time   Significant for prostate cancer, the patient undergoing prostatectomy January 2001 under [facility] for a Gleason 7, pathologic T3b (positive seminal vesicle involvement)  adenocarcinoma with 0 of 2 lymph nodes involved.  Current PSA is followed by Dr. [name]. Other medical problems include sleep apnea, minor coronary artery disease, hypertension, hypertriglyceridemia, cholelithiasis, colon polyps, cirrhosis by biopsy, diabetes, [name], history of nephrolithiasis, status post right renal surgery and history of left rotator cuff repair under [name].    PAST SURGICAL HISTORY: Past Surgical History:  Procedure Laterality Date  . ANKLE SURGERY    . CARDIAC CATHETERIZATION  09/16/96   Normal LV systolic function,dense ca+ prox. portion of the LAD w/50% narrowing in the distal portion, 30-40% irreg. in the proximal portion & 80% narrowing in the ostial portion of the posterolateral branch.  . CHOLECYSTECTOMY  04/04/2013  . CHOLECYSTECTOMY N/A 04/04/2013   Procedure: LAPAROSCOPIC CHOLECYSTECTOMY WITH INTRAOPERATIVE CHOLANGIOGRAM;  [name], MD;  Location: [place];  Service: General;  [name];  . COLONOSCOPY     Hx: of  . CYSTOSCOPY     with stent exchange Dr. [name] 06-29-17  . CYSTOSCOPY W/ URETERAL STENT PLACEMENT Bilateral 06/01/2015   Procedure: CYSTOSCOPY WITH BILATERAL STENT REPLACEMENT;  Surgeon: Raynelle Bring, MD;  Location: [facility];  Service: Urology;  Laterality: Bilateral;  .  CYSTOSCOPY W/ URETERAL STENT PLACEMENT Bilateral 10/29/2015   Procedure: CYSTOSCOPY WITH BILATERAL STENT REPLACEMENT;  Surgeon: Raynelle Bring, MD;  Location[facility];  Service: Urology;  Laterality: Bilateral;  . CYSTOSCOPY W/ URETERAL STENT PLACEMENT Bilateral 05/26/2016   Procedure: CYSTO URETEROSCOPY  WITH BILATERAL  STENT REPLACEMENT;  Surgeon: Raynelle Bring, MD;  Location[facility];  Service: Urology;  Laterality: Bilateral;  . CYSTOSCOPY W/ URETERAL STENT PLACEMENT Bilateral 06/29/2017   Procedure: CYSTOSCOPY WITH RETROGRADE AND STENT CHANGE;  Surgeon: Raynelle Bring, MD;  [facility];  Service: Urology;   Laterality: Bilateral;  . CYSTOSCOPY WITH STENT PLACEMENT Bilateral 02/05/2015   Procedure: CYSTOSCOPY RETROGRADE AND BILATERAL  STENT PLACEMENT;  Surgeon: Kathie Rhodes, MD;  [facility];  Service: Urology;  Laterality: Bilateral;  . CYSTOSCOPY WITH STENT PLACEMENT Bilateral 12/01/2016   Procedure: CYSTOSCOPY WITH STENT EXCHANGE;  Surgeon: Raynelle Bring, MD;  [facility];  Service: Urology;  Laterality: Bilateral;  . INFUSION PORT  04/04/2013   RIGHT SUBCLAVIAN  . KIDNEY STONE SURGERY    . MOUTH SURGERY  10/19/2015   left upper teeth removed along with palate abscess   . PORTACATH PLACEMENT N/A 04/04/2013   Procedure: INSERTION PORT-A-CATH;  [name], MD;  Location: [place];  Service: General;  [name];  . PROSTATECTOMY    . ROTATOR CUFF REPAIR    . TRANSURETHRAL RESECTION OF BLADDER TUMOR WITH GYRUS (TURBT-GYRUS) N/A 02/05/2015   Procedure: TRANSURETHRAL RESECTION OF BLADDER TUMOR  ;  Surgeon: Kathie Rhodes, MD;  Location[facility];  Service: Urology;  [name];    FAMILY HISTORY Family History  Problem Relation Age of Onset  . Heart disease Father   . Heart attack Father   . Cancer Sister   . Heart attack Brother   . Heart attack Brother   . Heart attack Brother   . Heart attack Brother   . Heart attack Brother   . Cancer Brother   . Cancer Brother   The patient's father died at the age of 66 from an MI.  The patient's mother died from "old age" at 36.  The patient is one of nine siblings.  One brother died from cancer of the esophagus, 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, [name], a homemaker, died in 06/30/2012: [name] is an Scientist, physiological for [employer]; [name], is a bookkeeper for a [employer]; and [name] is Environmental consultant.  Everybody lives in [place].  The patient has five grandchildren.  He is a member of [employer]. [employer].    ADVANCED DIRECTIVES: in place   HEALTH MAINTENANCE: Social History   Tobacco Use  . Smoking status: Former Smoker    Types: Pipe, Landscape architect  . Smokeless tobacco: Former Systems developer    Types: Chew  . Tobacco comment: QUIT SMOKING MANY YEARS AGO "  never much  Substance Use Topics  . Alcohol use: No  . Drug use: No     Colonoscopy:  PSA: Followed by Dr. [name]  Bone density:  Lipid panel:  Allergies  Allergen Reactions  . Atenolol Other (See Comments)    Slowed heart rate.   . Niacin Other (See Comments)  headaches    Current Outpatient Medications  Medication Sig Dispense Refill  . allopurinol (ZYLOPRIM) 300 MG tablet TAKE 1 TABLET EVERY DAY (Patient taking differently: Take 300 mg by mouth daily) 90 tablet 3  . amLODipine (NORVASC) 2.5 MG tablet Take 2.5 mg by mouth daily.     Marland Kitchen atorvastatin (LIPITOR) 20 MG tablet Take 20 mg by mouth at bedtime.    . Calcium-Magnesium-Vitamin D (CALCIUM 1200+D3 PO) Take 1 tablet by mouth at bedtime.     . cetirizine (ZYRTEC) 10 MG tablet Take 10 mg by mouth daily.    . cholestyramine (QUESTRAN) 4 g packet Take 4 g by mouth 3 (three) times daily as needed (for diarrhea).     . Cyanocobalamin (VITAMIN B-12) 2500 MCG SUBL Place 2,500 mcg under the tongue daily.    . cyclobenzaprine (FLEXERIL) 10 MG tablet Take 5 mg by mouth 3 (three) times daily as needed for muscle spasms.    . fenofibrate 160 MG tablet Take 160 mg by mouth at bedtime.    . gabapentin (NEURONTIN) 300 MG capsule Take 300 mg by mouth 3 (three) times daily.     . Insulin Glargine (TOUJEO MAX SOLOSTAR) 300 UNIT/ML SOPN Inject 64 Units into the skin daily.    Marland Kitchen latanoprost (XALATAN) 0.005 % ophthalmic solution Place 1 drop into both eyes at bedtime.    Marland Kitchen leuprolide (LUPRON) 22.5 MG  injection Inject 22.5 mg into the muscle every 3 (three) months.    . lidocaine-prilocaine (EMLA) cream Apply 1 application topically as needed. (Patient taking differently: Apply 1 application topically daily as needed (port numbing). ) 30 g 3  . lipase/protease/amylase (CREON) 12000 units CPEP capsule Take 1 capsule (12,000 Units total) by mouth 3 (three) times daily before meals. 270 capsule 6  . magnesium oxide (MAG-OX) 400 (241.3 MG) MG tablet Take 400 tablets by mouth 2 (two) times daily.   4  . metFORMIN (GLUCOPHAGE) 1000 MG tablet Take 1,000 mg by mouth 2 (two) times daily with a meal.     . Multiple Vitamins-Minerals (MULTIVITAMIN WITH MINERALS) tablet Take 1 tablet by mouth daily.    . Rivaroxaban (XARELTO) 15 MG TABS tablet Take 15 mg by mouth at bedtime.     . sertraline (ZOLOFT) 100 MG tablet Take 100 mg by mouth daily.    . vitamin C (ASCORBIC ACID) 500 MG tablet Take 500 mg by mouth daily.     No current facility-administered medications for this visit.    Facility-Administered Medications Ordered in Other Visits  Medication Dose Route Frequency Provider Last Rate Last Dose  . fludeoxyglucose F - 18 (FDG) injection 8.9 millicurie  8.9 millicurie Intravenous Once PRN Barnet Glasgow, MD      . sodium chloride 0.9 % injection 10 mL  10 mL Intracatheter PRN Glendora Clouatre, [name], MD   10 mL at 09/09/16 1212  . sodium chloride 0.9 % injection 10 mL  10 mL Intracatheter PRN Rayssa Atha, [name], MD   10 mL at 01/05/17 1313    Objective: Older white man who appears stated age  84:   09/07/17 0820  BP: (!) 116/51  Pulse: 69  Resp: 18  Temp: 97.8 F (36.6 C)  SpO2: 99%     Body mass index is 26.46 kg/m.     Filed Weights   09/07/17 0820  Weight: 174 lb (78.9 kg)  we have weights from 10/28/2016, 200 pounds, and 10/17/2016, and 185 pounds.  ECOG FS:0 - Asymptomatic  Sclerae unicteric,  EOMs intact Oropharynx clear, full upper plate No cervical or supraclavicular adenopathy,  no axillary or inguinal adenopathy Lungs no rales or rhonchi Heart regular rate and rhythm Abd soft, nontender, positive bowel sounds MSK no focal spinal tenderness, no upper extremity lymphedema Neuro: nonfocal, well oriented, appropriate affect    LAB RESULTS:  CMP     Component Value Date/Time   NA 134 (L) 07/13/2017 0857   NA 139 03/21/2017 1351   K 4.3 07/13/2017 0857   K 3.6 03/21/2017 1351   CL 103 07/13/2017 0857   CO2 20 (L) 07/13/2017 0857   CO2 23 03/21/2017 1351   GLUCOSE 265 (H) 07/13/2017 0857   GLUCOSE 201 (H) 03/21/2017 1351   BUN 16 07/13/2017 0857   BUN 25.1 03/21/2017 1351   CREATININE 1.57 (H) 07/13/2017 0857   CREATININE 1.6 (H) 03/21/2017 1351   CALCIUM 9.6 07/13/2017 0857   CALCIUM 9.3 03/21/2017 1351   PROT 6.6 07/13/2017 0857   PROT 6.5 03/21/2017 1351   ALBUMIN 3.8 07/13/2017 0857   ALBUMIN 3.8 03/21/2017 1351   AST 22 07/13/2017 0857   AST 25 03/21/2017 1351   ALT 17 07/13/2017 0857   ALT 23 03/21/2017 1351   ALKPHOS 56 07/13/2017 0857   ALKPHOS 57 03/21/2017 1351   BILITOT 0.5 07/13/2017 0857   BILITOT 0.27 03/21/2017 1351   GFRNONAA 39 (L) 07/13/2017 0857   GFRAA 45 (L) 07/13/2017 0857    No results found for: SPEP  Lab Results  Component Value Date   WBC 5.3 09/07/2017   NEUTROABS 4.3 09/07/2017   HGB 10.9 (L) 09/07/2017   HCT 35.4 (L) 09/07/2017   MCV 82.1 09/07/2017   PLT 150 09/07/2017      Chemistry      Component Value Date/Time   NA 134 (L) 07/13/2017 0857   NA 139 03/21/2017 1351   K 4.3 07/13/2017 0857   K 3.6 03/21/2017 1351   CL 103 07/13/2017 0857   CO2 20 (L) 07/13/2017 0857   CO2 23 03/21/2017 1351   BUN 16 07/13/2017 0857   BUN 25.1 03/21/2017 1351   CREATININE 1.57 (H) 07/13/2017 0857   CREATININE 1.6 (H) 03/21/2017 1351      Component Value Date/Time   CALCIUM 9.6 07/13/2017 0857   CALCIUM 9.3 03/21/2017 1351   ALKPHOS 56 07/13/2017 0857   ALKPHOS 57 03/21/2017 1351   AST 22 07/13/2017 0857    AST 25 03/21/2017 1351   ALT 17 07/13/2017 0857   ALT 23 03/21/2017 1351   BILITOT 0.5 07/13/2017 0857   BILITOT 0.27 03/21/2017 1351      Urinalysis    Component Value Date/Time   COLORURINE YELLOW 02/04/2015 1649   APPEARANCEUR TURBID (A) 02/04/2015 1649   LABSPEC 1.020 04/24/2015 0846   PHURINE 6.0 04/24/2015 0846   PHURINE 5.5 02/04/2015 1649   GLUCOSEU 250 04/24/2015 0846   HGBUR Large 04/24/2015 0846   HGBUR LARGE (A) 02/04/2015 1649   BILIRUBINUR Negative 04/24/2015 0846   KETONESUR Negative 04/24/2015 0846   KETONESUR NEGATIVE 02/04/2015 1649   PROTEINUR 30 04/24/2015 0846   PROTEINUR 100 (A) 02/04/2015 1649   UROBILINOGEN 0.2 04/24/2015 0846   NITRITE Negative 04/24/2015 0846   NITRITE POSITIVE (A) 02/04/2015 1649   LEUKOCYTESUR Negative 04/24/2015 0846    STUDIES: Nm Pet Image Restag (ps) Skull Base To Thigh  Result Date: 09/06/2017 CLINICAL DATA:  Subsequent treatment strategy for non-Hodgkin's lymphoma. EXAM: NUCLEAR MEDICINE PET SKULL BASE TO THIGH TECHNIQUE: 8.9 mCi  F-18 FDG was injected intravenously. Full-ring PET imaging was performed from the skull base to thigh after the radiotracer. CT data was obtained and used for attenuation correction and anatomic localization. Fasting blood glucose: 123 mg/dl COMPARISON:  Multiple exams, including 02/13/2017 FINDINGS: Mediastinal blood pool activity: SUV max 2.5 Background hepatic activity: 4.2 NECK: No significant abnormal hypermetabolic activity in this region. Incidental CT findings: Vertebral artery and carotid atherosclerotic calcifications. CHEST: No significant abnormal hypermetabolic activity in this region. Incidental CT findings: Coronary, aortic arch, and branch vessel atherosclerotic vascular disease. Right Port-A-Cath tip: Right atrium. Mild cardiomegaly. Stable mild atelectasis or scarring in both lower lobes. Stable 2 by 3 mm subpleural nodule in the right upper lobe on image 22/9. ABDOMEN/PELVIS: Ill-defined  density along the right lower hemidiaphragmatic crus/retrocrural region is similar to the prior exam with maximum SUV of 2.6, formerly 2.3. The density in this vicinity measures approximately 1.2 by 0.9 cm, formerly 1.3 by 0.9 cm. The lateral segment left hepatic lobe calcified mass remains hypoactive in metabolic activity compared to the rest of the liver. A 4.0 cm in diameter hypodense lesion in the dome of the right hepatic lobe previously measured 3.7 cm and has similar uptake to the rest of the liver, this lesion appears to of been growing since 2016 where it measured approximately 1.7 cm in diameter. Several other punctate hepatic calcifications are present. No accentuated or focal splenic activity identified. Incidental CT findings: Cholecystectomy. The spleen measures 11.4 by 6.2 by 15.2 cm (volume = 560 cm^3). Nonobstructive bilateral nephrolithiasis. Scarring in the right kidney upper pole. Bilateral double-J ureteral stents appear satisfactorily position. Descending and sigmoid colon diverticulosis. SKELETON: No significant abnormal hypermetabolic activity in this region. Incidental CT findings: Degenerative glenohumeral arthropathy, left greater than right. Degenerative left sternoclavicular arthropathy. Grade 1 degenerative anterolisthesis at L5-S1 with lower lumbar spondylosis and degenerative disc disease. IMPRESSION: 1. The small retrocrural density on the right is similar in size to the prior exam, with a maximum SUV of 2.6 currently and previously 2.3. This is currently Deauville 3. 2. There is an enlarging hypodense lesion in the dome of the right hepatic lobe with a metabolic activity similar to the surrounding liver. This may well be an enlarging benign lesions such as hemangioma, but given the progressive enlargement, hepatic MRI with and without contrast would be suggested for definitive characterization. 3. Mild splenomegaly but without abnormal splenic activity. 4. Other imaging findings of  potential clinical significance: Aortic Atherosclerosis (ICD10-I70.0). Mild cardiomegaly. Stable bibasilar scarring. Densely calcified lateral segment left hepatic lobe mass is chronically stable and relatively hypoactive. Bilateral nonobstructive nephrolithiasis with bilateral double-J ureteral stents in place. Lower lumbar spondylosis and degenerative disc disease. Electronically Signed   By: Van Clines M.D.   On: 09/06/2017 12:04     ASSESSMENT:  84 y.o. patient with a diagnosis of    #1 Primary hepatic non-Hodgkin's lymphoma established through liver biopsy February 2006, treated with Rituxan x9 and then CHOP x6.  All chemotherapy completed in 2007.  Maintenance Rituxan discontinued October 2008.     #2  Biopsy proven retroperitoneal recurrence documented November 2014. Bone marrow biopsy 04/01/2013 was negative   #3 status post  laparoscopic cholecystectomy and port placement on 04/04/2013.  #4 Cycle #1  RICE chemotherapy  completed on 05/02/2013.   #5 cycle #2 RICE chemotherapy  on 05/17/2013  #6 PET scan performed on 05/23/2013 revealed marked partial response to chemotherapy with a retroperitoneal mass decrease in metabolic activity from SUV of 13.2 to  6.3. Right adrenal mass size and metabolic activity is resolved when compared to the previous PET scan.   #7 Evaluated at [facility] by Dr. Tomasa Hosteller for autologous transplant on 05/27/2013 and was quoted a 3% chance of significant heart damage and possibly death from the treatment and a 50% chance of being alive 5 years from now after transplant (versus perhaps 2 years without). After much thought the patient and family have decided firmly they do not wish to proceed to stem cell transplant.  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 treated with vancomycin IV x14 days completed 07/02/2013, followed by doxycycline for 2 additional weeks  #10 started maintenance  Rituxan May 2015 (1 dose every 8 weeks)  #11 A-fib, on rivaroxaban  #12 prostate cancer, stage IV-- per Dr [name]  (a) obstructive uropathy secondary to bladder mass (Gleason 9), s/p  L stenting 02/05/2015, exchanged PRN  #13 PET scan 04/29/2016 shows continuing evidence of response, with no significant abnormal hypermetabolic activity to indicate recurrent or active disease  (a) repeat PET scan 02/13/2017 shows a continuing metabolic response I have reviewed the above documentation for accuracy and completeness, and I agree with the above.  #14 iron deficiency anemia:  PLAN: [name] is now just about 5 years out from initial diagnosis of his lymphoma.  He will be 5 years from completion of his chemotherapy in January 2020.  The PET scan does not show any evidence of disease activity.  The lesion in the liver, which is larger but not metabolically active, is likely to be a hemangioma.  We are going to obtain a liver MRI in November to confirm  Otherwise from our point of view the plan is to continue rituximab every 2 months indefinitely unless he develops complications.  So far he has tolerated it quite well  He is followed for his prostate cancer by Dr. [name] and that appears to be stable.  I will see [name] again in November, after his liver MRI  He knows to call for any issues that may develop before that visit.  Vondell Sowell, [name], MD  09/07/17 8:36 AM Medical Oncology and Hematology [facility] [address] [place], [place] [address] Tel. [phone]    Fax. [phone]  This document serves as a record of services personally performed by [name], MD. It was created on his behalf by Margit Banda, a trained medical scribe. The creation of this record is based on the scribe's personal observations and the provider's statements to them.   I have reviewed the above documentation for accuracy and completeness, and I agree with the above.

## 2017-09-23 DIAGNOSIS — W309XXA Contact with unspecified agricultural machinery, initial encounter: Secondary | ICD-10-CM

## 2017-09-23 HISTORY — DX: Contact with unspecified agricultural machinery, initial encounter: W30.9XXA

## 2017-09-27 ENCOUNTER — Other Ambulatory Visit: Payer: Self-pay

## 2017-09-27 ENCOUNTER — Emergency Department (HOSPITAL_COMMUNITY): Payer: Medicare Other

## 2017-09-27 ENCOUNTER — Encounter (HOSPITAL_COMMUNITY): Payer: Self-pay | Admitting: Neurology

## 2017-09-27 ENCOUNTER — Inpatient Hospital Stay (HOSPITAL_COMMUNITY)
Admission: EM | Admit: 2017-09-27 | Discharge: 2017-09-29 | DRG: 605 | Disposition: A | Discharge status: Home / Self Care | Payer: Medicare Other | Attending: General Surgery | Admitting: General Surgery

## 2017-09-27 DIAGNOSIS — S50811A Abrasion of right forearm, initial encounter: Secondary | ICD-10-CM | POA: Diagnosis not present

## 2017-09-27 DIAGNOSIS — Z7901 Long term (current) use of anticoagulants: Secondary | ICD-10-CM

## 2017-09-27 DIAGNOSIS — I959 Hypotension, unspecified: Secondary | ICD-10-CM | POA: Diagnosis present

## 2017-09-27 DIAGNOSIS — D6832 Hemorrhagic disorder due to extrinsic circulating anticoagulants: Secondary | ICD-10-CM | POA: Diagnosis not present

## 2017-09-27 DIAGNOSIS — T07XXXA Unspecified multiple injuries, initial encounter: Secondary | ICD-10-CM

## 2017-09-27 DIAGNOSIS — S50812A Abrasion of left forearm, initial encounter: Secondary | ICD-10-CM | POA: Diagnosis present

## 2017-09-27 DIAGNOSIS — S3991XA Unspecified injury of abdomen, initial encounter: Secondary | ICD-10-CM | POA: Diagnosis present

## 2017-09-27 DIAGNOSIS — E11649 Type 2 diabetes mellitus with hypoglycemia without coma: Secondary | ICD-10-CM | POA: Diagnosis present

## 2017-09-27 DIAGNOSIS — I480 Paroxysmal atrial fibrillation: Secondary | ICD-10-CM | POA: Diagnosis present

## 2017-09-27 DIAGNOSIS — R0781 Pleurodynia: Secondary | ICD-10-CM | POA: Diagnosis not present

## 2017-09-27 DIAGNOSIS — R52 Pain, unspecified: Secondary | ICD-10-CM | POA: Diagnosis not present

## 2017-09-27 DIAGNOSIS — S7012XA Contusion of left thigh, initial encounter: Secondary | ICD-10-CM | POA: Diagnosis not present

## 2017-09-27 DIAGNOSIS — R109 Unspecified abdominal pain: Secondary | ICD-10-CM | POA: Diagnosis not present

## 2017-09-27 DIAGNOSIS — M109 Gout, unspecified: Secondary | ICD-10-CM | POA: Diagnosis present

## 2017-09-27 DIAGNOSIS — C8590 Non-Hodgkin lymphoma, unspecified, unspecified site: Secondary | ICD-10-CM | POA: Diagnosis present

## 2017-09-27 DIAGNOSIS — R1084 Generalized abdominal pain: Secondary | ICD-10-CM | POA: Diagnosis not present

## 2017-09-27 DIAGNOSIS — E119 Type 2 diabetes mellitus without complications: Secondary | ICD-10-CM

## 2017-09-27 DIAGNOSIS — S0990XA Unspecified injury of head, initial encounter: Secondary | ICD-10-CM | POA: Diagnosis not present

## 2017-09-27 DIAGNOSIS — D649 Anemia, unspecified: Secondary | ICD-10-CM | POA: Diagnosis present

## 2017-09-27 DIAGNOSIS — W3089XA Contact with other specified agricultural machinery, initial encounter: Secondary | ICD-10-CM

## 2017-09-27 DIAGNOSIS — I10 Essential (primary) hypertension: Secondary | ICD-10-CM | POA: Diagnosis present

## 2017-09-27 DIAGNOSIS — Z888 Allergy status to other drugs, medicaments and biological substances status: Secondary | ICD-10-CM

## 2017-09-27 DIAGNOSIS — S299XXA Unspecified injury of thorax, initial encounter: Secondary | ICD-10-CM | POA: Diagnosis not present

## 2017-09-27 DIAGNOSIS — Z8546 Personal history of malignant neoplasm of prostate: Secondary | ICD-10-CM

## 2017-09-27 DIAGNOSIS — S30810A Abrasion of lower back and pelvis, initial encounter: Secondary | ICD-10-CM | POA: Diagnosis not present

## 2017-09-27 DIAGNOSIS — Z79899 Other long term (current) drug therapy: Secondary | ICD-10-CM

## 2017-09-27 DIAGNOSIS — R000 Tachycardia, unspecified: Secondary | ICD-10-CM | POA: Diagnosis not present

## 2017-09-27 DIAGNOSIS — R2232 Localized swelling, mass and lump, left upper limb: Secondary | ICD-10-CM

## 2017-09-27 DIAGNOSIS — S199XXA Unspecified injury of neck, initial encounter: Secondary | ICD-10-CM | POA: Diagnosis not present

## 2017-09-27 DIAGNOSIS — M25552 Pain in left hip: Secondary | ICD-10-CM | POA: Diagnosis not present

## 2017-09-27 DIAGNOSIS — Z7984 Long term (current) use of oral hypoglycemic drugs: Secondary | ICD-10-CM

## 2017-09-27 DIAGNOSIS — S59919A Unspecified injury of unspecified forearm, initial encounter: Secondary | ICD-10-CM | POA: Diagnosis not present

## 2017-09-27 DIAGNOSIS — M549 Dorsalgia, unspecified: Secondary | ICD-10-CM | POA: Diagnosis not present

## 2017-09-27 DIAGNOSIS — Z87891 Personal history of nicotine dependence: Secondary | ICD-10-CM

## 2017-09-27 HISTORY — DX: Gout, unspecified: M10.9

## 2017-09-27 HISTORY — DX: Malignant (primary) neoplasm, unspecified: C80.1

## 2017-09-27 HISTORY — DX: Paroxysmal atrial fibrillation: I48.0

## 2017-09-27 HISTORY — DX: Essential (primary) hypertension: I10

## 2017-09-27 HISTORY — DX: Non-Hodgkin lymphoma, unspecified, unspecified site: C85.90

## 2017-09-27 HISTORY — DX: Hyperlipidemia, unspecified: E78.5

## 2017-09-27 HISTORY — DX: Type 2 diabetes mellitus without complications: E11.9

## 2017-09-27 LAB — COMPREHENSIVE METABOLIC PANEL
ALK PHOS: 50 U/L (ref 38–126)
ALT: 25 U/L (ref 17–63)
ANION GAP: 10 (ref 5–15)
AST: 35 U/L (ref 15–41)
Albumin: 3.7 g/dL (ref 3.5–5.0)
BILIRUBIN TOTAL: 0.4 mg/dL (ref 0.3–1.2)
BUN: 21 mg/dL — ABNORMAL HIGH (ref 6–20)
CALCIUM: 9.3 mg/dL (ref 8.9–10.3)
CO2: 21 mmol/L — ABNORMAL LOW (ref 22–32)
Chloride: 106 mmol/L (ref 101–111)
Creatinine, Ser: 1.6 mg/dL — ABNORMAL HIGH (ref 0.61–1.24)
GFR, EST AFRICAN AMERICAN: 44 mL/min — AB (ref >60)
GFR, EST NON AFRICAN AMERICAN: 38 mL/min — AB (ref >60)
Glucose, Bld: 190 mg/dL — ABNORMAL HIGH (ref 65–99)
Potassium: 4 mmol/L (ref 3.5–5.1)
Sodium: 137 mmol/L (ref 135–145)
TOTAL PROTEIN: 5.9 g/dL — AB (ref 6.5–8.1)

## 2017-09-27 LAB — TYPE AND SCREEN
ABO/RH(D): A POS
ANTIBODY SCREEN: NEGATIVE
UNIT DIVISION: 0
Unit division: 0

## 2017-09-27 LAB — I-STAT CHEM 8, ED
BUN: 22 mg/dL — AB (ref 6–20)
BUN: 20 mg/dL (ref 6–20)
CALCIUM ION: 1.21 mmol/L (ref 1.15–1.40)
CHLORIDE: 105 mmol/L (ref 101–111)
Calcium, Ion: 1.14 mmol/L — ABNORMAL LOW (ref 1.15–1.40)
Chloride: 108 mmol/L (ref 101–111)
Creatinine, Ser: 1.5 mg/dL — ABNORMAL HIGH (ref 0.61–1.24)
Creatinine, Ser: 1.2 mg/dL (ref 0.61–1.24)
GLUCOSE: 154 mg/dL — AB (ref 65–99)
Glucose, Bld: 188 mg/dL — ABNORMAL HIGH (ref 65–99)
HCT: 30 % — ABNORMAL LOW (ref 39.0–52.0)
HEMATOCRIT: 25 % — AB (ref 39.0–52.0)
HEMOGLOBIN: 8.5 g/dL — AB (ref 13.0–17.0)
Hemoglobin: 10.2 g/dL — ABNORMAL LOW (ref 13.0–17.0)
POTASSIUM: 3.6 mmol/L (ref 3.5–5.1)
Potassium: 4 mmol/L (ref 3.5–5.1)
SODIUM: 139 mmol/L (ref 135–145)
Sodium: 139 mmol/L (ref 135–145)
TCO2: 20 mmol/L — ABNORMAL LOW (ref 22–32)
TCO2: 17 mmol/L — ABNORMAL LOW (ref 22–32)

## 2017-09-27 LAB — HEMOGLOBIN AND HEMATOCRIT, BLOOD
HCT: 27.9 % — ABNORMAL LOW (ref 39.0–52.0)
HEMATOCRIT: 24.4 % — AB (ref 39.0–52.0)
HEMOGLOBIN: 7.8 g/dL — AB (ref 13.0–17.0)
Hemoglobin: 8.9 g/dL — ABNORMAL LOW (ref 13.0–17.0)

## 2017-09-27 LAB — ETHANOL

## 2017-09-27 LAB — GLUCOSE, CAPILLARY
GLUCOSE-CAPILLARY: 111 mg/dL — AB (ref 65–99)
GLUCOSE-CAPILLARY: 65 mg/dL (ref 65–99)

## 2017-09-27 LAB — CBC
HCT: 30.8 % — ABNORMAL LOW (ref 39.0–52.0)
HEMOGLOBIN: 9.9 g/dL — AB (ref 13.0–17.0)
MCH: 26.8 pg (ref 26.0–34.0)
MCHC: 32.1 g/dL (ref 30.0–36.0)
MCV: 83.2 fL (ref 78.0–100.0)
Platelets: 155 10*3/uL (ref 150–400)
RBC: 3.7 MIL/uL — AB (ref 4.22–5.81)
RDW: 22.4 % — ABNORMAL HIGH (ref 11.5–15.5)
WBC: 4.6 10*3/uL (ref 4.0–10.5)

## 2017-09-27 LAB — PREPARE FRESH FROZEN PLASMA
UNIT DIVISION: 0
Unit division: 0

## 2017-09-27 LAB — PROTIME-INR
INR: 1.8
Prothrombin Time: 20.7 seconds — ABNORMAL HIGH (ref 11.4–15.2)

## 2017-09-27 LAB — BPAM RBC
BLOOD PRODUCT EXPIRATION DATE: 201906282359
Blood Product Expiration Date: 201906282359
ISSUE DATE / TIME: 201906051535
ISSUE DATE / TIME: 201906051535
UNIT TYPE AND RH: 6200
Unit Type and Rh: 6200

## 2017-09-27 LAB — BPAM FFP
BLOOD PRODUCT EXPIRATION DATE: 201906282359
Blood Product Expiration Date: 201906282359
ISSUE DATE / TIME: 201906051536
ISSUE DATE / TIME: 201906051536
UNIT TYPE AND RH: 6200
Unit Type and Rh: 6200

## 2017-09-27 LAB — URINALYSIS, ROUTINE W REFLEX MICROSCOPIC
Bilirubin Urine: NEGATIVE
GLUCOSE, UA: 50 mg/dL — AB
KETONES UR: NEGATIVE mg/dL
LEUKOCYTES UA: NEGATIVE
Nitrite: NEGATIVE
PH: 6 (ref 5.0–8.0)
PROTEIN: 30 mg/dL — AB
Specific Gravity, Urine: 1.016 (ref 1.005–1.030)

## 2017-09-27 LAB — ABO/RH: ABO/RH(D): A POS

## 2017-09-27 LAB — APTT: aPTT: 41 seconds — ABNORMAL HIGH (ref 24–36)

## 2017-09-27 LAB — I-STAT CG4 LACTIC ACID, ED: Lactic Acid, Venous: 2.52 mmol/L (ref 0.5–1.9)

## 2017-09-27 LAB — SAMPLE TO BLOOD BANK

## 2017-09-27 MED ORDER — TRAMADOL HCL 50 MG PO TABS
50.0000 mg | ORAL_TABLET | Freq: Four times a day (QID) | ORAL | Status: DC
  Administered 2017-09-27 – 2017-09-29 (×8): 50 mg via ORAL
  Filled 2017-09-27 (×8): qty 1

## 2017-09-27 MED ORDER — SODIUM CHLORIDE 0.9 % IV BOLUS
1000.0000 mL | Freq: Once | INTRAVENOUS | Status: AC
  Administered 2017-09-27: 1000 mL via INTRAVENOUS

## 2017-09-27 MED ORDER — GABAPENTIN 300 MG PO CAPS
300.0000 mg | ORAL_CAPSULE | Freq: Three times a day (TID) | ORAL | Status: DC
  Administered 2017-09-27 – 2017-09-28 (×5): 300 mg via ORAL
  Filled 2017-09-27 (×5): qty 1

## 2017-09-27 MED ORDER — LATANOPROST 0.005 % OP SOLN
1.0000 [drp] | Freq: Every day | OPHTHALMIC | Status: DC
  Administered 2017-09-27 – 2017-09-28 (×2): 1 [drp] via OPHTHALMIC
  Filled 2017-09-27: qty 2.5

## 2017-09-27 MED ORDER — ONDANSETRON 4 MG PO TBDP: 4.0000 mg | ORAL_TABLET | Freq: Four times a day (QID) | ORAL | Status: DC | PRN

## 2017-09-27 MED ORDER — INSULIN ASPART 100 UNIT/ML ~~LOC~~ SOLN
2.0000 [IU] | SUBCUTANEOUS | Status: DC
  Administered 2017-09-27: 4 [IU] via SUBCUTANEOUS
  Administered 2017-09-28: 2 [IU] via SUBCUTANEOUS

## 2017-09-27 MED ORDER — SERTRALINE HCL 100 MG PO TABS
100.0000 mg | ORAL_TABLET | Freq: Every day | ORAL | Status: DC
  Administered 2017-09-28 – 2017-09-29 (×2): 100 mg via ORAL
  Filled 2017-09-27 (×2): qty 1

## 2017-09-27 MED ORDER — OXYCODONE HCL 5 MG PO TABS
5.0000 mg | ORAL_TABLET | ORAL | Status: DC | PRN
Start: 2017-09-27 — End: 2017-09-29

## 2017-09-27 MED ORDER — FENTANYL CITRATE (PF) 100 MCG/2ML IJ SOLN: 50.0000 ug | INTRAMUSCULAR | Status: DC | PRN

## 2017-09-27 MED ORDER — ACETAMINOPHEN 500 MG PO TABS
1000.0000 mg | ORAL_TABLET | Freq: Three times a day (TID) | ORAL | Status: DC
  Administered 2017-09-27 – 2017-09-29 (×5): 1000 mg via ORAL
  Filled 2017-09-27 (×5): qty 2

## 2017-09-27 MED ORDER — SODIUM CHLORIDE 0.9 % IV SOLN
INTRAVENOUS | Status: DC
  Administered 2017-09-29: via INTRAVENOUS

## 2017-09-27 MED ORDER — IOHEXOL 300 MG/ML  SOLN
100.0000 mL | Freq: Once | INTRAMUSCULAR | Status: AC
  Administered 2017-09-27: 100 mL via INTRAVENOUS

## 2017-09-27 MED ORDER — MORPHINE SULFATE (PF) 4 MG/ML IV SOLN: 2.0000 mg | INTRAVENOUS | Status: DC | PRN

## 2017-09-27 MED ORDER — ONDANSETRON HCL 4 MG/2ML IJ SOLN: 4.0000 mg | Freq: Four times a day (QID) | INTRAMUSCULAR | Status: DC | PRN

## 2017-09-27 MED ORDER — PANCRELIPASE (LIP-PROT-AMYL) 12000-38000 UNITS PO CPEP
12000.0000 [IU] | ORAL_CAPSULE | Freq: Three times a day (TID) | ORAL | Status: DC
Start: 2017-09-27 — End: 2017-09-29
  Administered 2017-09-27 – 2017-09-29 (×6): 12000 [IU] via ORAL
  Filled 2017-09-27 (×6): qty 1

## 2017-09-27 NOTE — ED Notes (Signed)
Dr. Brantley Stage informed of pt's lab results, report called to [facility]

## 2017-09-27 NOTE — ED Provider Notes (Addendum)
Patient will be admitted by trauma.  No significant source of his bleeding save for a large hematoma to his left thigh.  However there is no underlying fracture.  BP is stable. Trauma will admit and observe.   Sherwood Gambler, MD 09/27/17 1614    Sherwood Gambler, MD 09/27/17 [phone]

## 2017-09-27 NOTE — ED Notes (Signed)
Pt's BP dropped to 80/50 manual, upgraded to Level 1. Taking pt to CT 2.
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
## 2017-09-27 NOTE — ED Notes (Signed)
Dr paged for lab results.
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
## 2017-09-27 NOTE — ED Notes (Signed)
Pt has positive posterior tibial pulse on left leg- has full sensation in foot and leg.

## 2017-09-27 NOTE — ED Notes (Signed)
CG-4 reported to Dr. Reather Converse
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
## 2017-09-27 NOTE — ED Notes (Signed)
Pt reports pain to left chest at ribs, and left hip pain.

## 2017-09-27 NOTE — ED Notes (Signed)
Pt's 3 daughters arrived to bedside.

## 2017-09-27 NOTE — ED Notes (Signed)
Called for CT, no table available at this time.
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
## 2017-09-27 NOTE — Progress Notes (Signed)
Orthopedic Tech Progress Note Patient Details:  [name] Sep 08, 1931 [id number]  Ortho Devices Ortho Device/Splint Location: Level 2 Trauma       [name] 09/27/2017, 2:13 PM

## 2017-09-28 ENCOUNTER — Observation Stay (HOSPITAL_COMMUNITY): Payer: Medicare Other

## 2017-09-28 ENCOUNTER — Encounter (HOSPITAL_COMMUNITY): Payer: Self-pay | Admitting: General Surgery

## 2017-09-28 DIAGNOSIS — S6992XA Unspecified injury of left wrist, hand and finger(s), initial encounter: Secondary | ICD-10-CM | POA: Diagnosis not present

## 2017-09-28 DIAGNOSIS — I959 Hypotension, unspecified: Secondary | ICD-10-CM | POA: Diagnosis present

## 2017-09-28 DIAGNOSIS — C8590 Non-Hodgkin lymphoma, unspecified, unspecified site: Secondary | ICD-10-CM | POA: Diagnosis present

## 2017-09-28 DIAGNOSIS — Z8546 Personal history of malignant neoplasm of prostate: Secondary | ICD-10-CM | POA: Diagnosis not present

## 2017-09-28 DIAGNOSIS — M109 Gout, unspecified: Secondary | ICD-10-CM | POA: Diagnosis present

## 2017-09-28 DIAGNOSIS — Z888 Allergy status to other drugs, medicaments and biological substances status: Secondary | ICD-10-CM | POA: Diagnosis not present

## 2017-09-28 DIAGNOSIS — S7012XA Contusion of left thigh, initial encounter: Secondary | ICD-10-CM | POA: Diagnosis present

## 2017-09-28 DIAGNOSIS — D649 Anemia, unspecified: Secondary | ICD-10-CM | POA: Diagnosis present

## 2017-09-28 DIAGNOSIS — S3991XA Unspecified injury of abdomen, initial encounter: Secondary | ICD-10-CM | POA: Diagnosis present

## 2017-09-28 DIAGNOSIS — Z87891 Personal history of nicotine dependence: Secondary | ICD-10-CM | POA: Diagnosis not present

## 2017-09-28 DIAGNOSIS — M7989 Other specified soft tissue disorders: Secondary | ICD-10-CM | POA: Diagnosis not present

## 2017-09-28 DIAGNOSIS — I10 Essential (primary) hypertension: Secondary | ICD-10-CM | POA: Diagnosis present

## 2017-09-28 DIAGNOSIS — E119 Type 2 diabetes mellitus without complications: Secondary | ICD-10-CM

## 2017-09-28 DIAGNOSIS — Z79899 Other long term (current) drug therapy: Secondary | ICD-10-CM | POA: Diagnosis not present

## 2017-09-28 DIAGNOSIS — R0781 Pleurodynia: Secondary | ICD-10-CM | POA: Diagnosis present

## 2017-09-28 DIAGNOSIS — S50811A Abrasion of right forearm, initial encounter: Secondary | ICD-10-CM | POA: Diagnosis present

## 2017-09-28 DIAGNOSIS — Z7984 Long term (current) use of oral hypoglycemic drugs: Secondary | ICD-10-CM | POA: Diagnosis not present

## 2017-09-28 DIAGNOSIS — W3089XA Contact with other specified agricultural machinery, initial encounter: Secondary | ICD-10-CM | POA: Diagnosis not present

## 2017-09-28 DIAGNOSIS — S50812A Abrasion of left forearm, initial encounter: Secondary | ICD-10-CM | POA: Diagnosis present

## 2017-09-28 DIAGNOSIS — T07XXXA Unspecified multiple injuries, initial encounter: Secondary | ICD-10-CM | POA: Diagnosis not present

## 2017-09-28 DIAGNOSIS — I480 Paroxysmal atrial fibrillation: Secondary | ICD-10-CM | POA: Diagnosis present

## 2017-09-28 DIAGNOSIS — D6832 Hemorrhagic disorder due to extrinsic circulating anticoagulants: Secondary | ICD-10-CM | POA: Diagnosis not present

## 2017-09-28 DIAGNOSIS — E11649 Type 2 diabetes mellitus with hypoglycemia without coma: Secondary | ICD-10-CM | POA: Diagnosis present

## 2017-09-28 DIAGNOSIS — M549 Dorsalgia, unspecified: Secondary | ICD-10-CM | POA: Diagnosis not present

## 2017-09-28 DIAGNOSIS — Z7901 Long term (current) use of anticoagulants: Secondary | ICD-10-CM | POA: Diagnosis not present

## 2017-09-28 HISTORY — DX: Essential (primary) hypertension: I10

## 2017-09-28 LAB — COMPREHENSIVE METABOLIC PANEL
ALK PHOS: 40 U/L (ref 38–126)
ALT: 22 U/L (ref 17–63)
AST: 34 U/L (ref 15–41)
Albumin: 3 g/dL — ABNORMAL LOW (ref 3.5–5.0)
Anion gap: 7 (ref 5–15)
BILIRUBIN TOTAL: 0.2 mg/dL — AB (ref 0.3–1.2)
BUN: 16 mg/dL (ref 6–20)
CO2: 21 mmol/L — ABNORMAL LOW (ref 22–32)
CREATININE: 1.2 mg/dL (ref 0.61–1.24)
Calcium: 8.4 mg/dL — ABNORMAL LOW (ref 8.9–10.3)
Chloride: 111 mmol/L (ref 101–111)
GFR calc Af Amer: >60 mL/min (ref >60)
GFR, EST NON AFRICAN AMERICAN: 54 mL/min — AB (ref >60)
Glucose, Bld: 113 mg/dL — ABNORMAL HIGH (ref 65–99)
Potassium: 3.6 mmol/L (ref 3.5–5.1)
Sodium: 139 mmol/L (ref 135–145)
TOTAL PROTEIN: 4.8 g/dL — AB (ref 6.5–8.1)

## 2017-09-28 LAB — GLUCOSE, CAPILLARY
GLUCOSE-CAPILLARY: 54 mg/dL — AB (ref 65–99)
GLUCOSE-CAPILLARY: 113 mg/dL — AB (ref 65–99)
GLUCOSE-CAPILLARY: 157 mg/dL — AB (ref 65–99)
Glucose-Capillary: 134 mg/dL — ABNORMAL HIGH (ref 65–99)
Glucose-Capillary: 62 mg/dL — ABNORMAL LOW (ref 65–99)
Glucose-Capillary: 46 mg/dL — ABNORMAL LOW (ref 65–99)
Glucose-Capillary: 85 mg/dL (ref 65–99)
Glucose-Capillary: 160 mg/dL — ABNORMAL HIGH (ref 65–99)

## 2017-09-28 LAB — CBC
HEMATOCRIT: 24.4 % — AB (ref 39.0–52.0)
Hemoglobin: 7.8 g/dL — ABNORMAL LOW (ref 13.0–17.0)
MCH: 26.6 pg (ref 26.0–34.0)
MCHC: 32 g/dL (ref 30.0–36.0)
MCV: 83.3 fL (ref 78.0–100.0)
Platelets: 117 10*3/uL — ABNORMAL LOW (ref 150–400)
RBC: 2.93 MIL/uL — AB (ref 4.22–5.81)
RDW: 22.5 % — ABNORMAL HIGH (ref 11.5–15.5)
WBC: 4.3 10*3/uL (ref 4.0–10.5)

## 2017-09-28 LAB — LIPASE, BLOOD: LIPASE: 37 U/L (ref 11–51)

## 2017-09-28 LAB — HEMOGLOBIN AND HEMATOCRIT, BLOOD
HEMATOCRIT: 25.7 % — AB (ref 39.0–52.0)
Hemoglobin: 8.2 g/dL — ABNORMAL LOW (ref 13.0–17.0)

## 2017-09-28 LAB — CDS SEROLOGY

## 2017-09-28 LAB — BLOOD PRODUCT ORDER (VERBAL) VERIFICATION

## 2017-09-28 MED ORDER — INSULIN ASPART 100 UNIT/ML ~~LOC~~ SOLN: 0.0000 [IU] | Freq: Every day | SUBCUTANEOUS | Status: DC

## 2017-09-28 MED ORDER — GLUCOSE 4 G PO CHEW
CHEWABLE_TABLET | ORAL | Status: AC
  Administered 2017-09-28: 1
  Filled 2017-09-28: qty 1

## 2017-09-28 MED ORDER — ALLOPURINOL 300 MG PO TABS
300.0000 mg | ORAL_TABLET | Freq: Every day | ORAL | Status: DC
  Administered 2017-09-29: 300 mg via ORAL
  Filled 2017-09-28: qty 1

## 2017-09-28 MED ORDER — INSULIN ASPART 100 UNIT/ML ~~LOC~~ SOLN
0.0000 [IU] | Freq: Three times a day (TID) | SUBCUTANEOUS | Status: DC
  Administered 2017-09-28: 2 [IU] via SUBCUTANEOUS

## 2017-09-28 NOTE — Progress Notes (Signed)
OT Cancellation Note  Patient Details Name: [name] MRN: [id number] DOB: Apr 10, 1932   Cancelled Treatment:    Reason Eval/Treat Not Completed: Medical issues which prohibited therapy(Pt will low blood sugar. Will follow.)  [name] 09/28/2017, 8:48 AM  09/28/2017 Nestor Lewandowsky, OTR/L Pager: [phone]

## 2017-09-28 NOTE — Progress Notes (Signed)
Inpatient Diabetes Program Recommendations  AACE/ADA: New Consensus Statement on Inpatient Glycemic Control (2015)  Target Ranges:  Prepandial:   less than 140 mg/dL      Peak postprandial:   less than 180 mg/dL (1-2 hours)      Critically ill patients:  140 - 180 mg/dL   Lab Results  Component Value Date   GLUCAP 85 09/28/2017    Review of Glycemic ControlResults for BJ, MORLOCK (MRN [id number]) as of 09/28/2017 10:36  Ref. Range 09/28/2017 07:33 09/28/2017 08:41 09/28/2017 09:26 09/28/2017 09:48  Glucose-Capillary Latest Ref Range: 65 - 99 mg/dL 62 (L) 46 (L) 54 (L) 85    Diabetes history: Type 2 DM  Outpatient Diabetes medications: Metformin 1000 mg bid, Toujeo 64 units daily with breakfast Current orders for Inpatient glycemic control:  Novolog 2-4-6 q 4 hours Inpatient Diabetes Program Recommendations:   Called and discussed with PA, Will Jennings.  Orders received to reduce Novolog correction to tid with meals- sensitive.  He does not appear to need basal insulin at this time.  Will follow.   Thanks,  Adah Perl, RN, BC-ADM Inpatient Diabetes Coordinator Pager [phone] (8a-5p)
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
## 2017-09-29 DIAGNOSIS — T07XXXA Unspecified multiple injuries, initial encounter: Secondary | ICD-10-CM | POA: Diagnosis not present

## 2017-09-29 DIAGNOSIS — D6832 Hemorrhagic disorder due to extrinsic circulating anticoagulants: Secondary | ICD-10-CM | POA: Diagnosis not present

## 2017-09-29 DIAGNOSIS — I10 Essential (primary) hypertension: Secondary | ICD-10-CM | POA: Diagnosis not present

## 2017-09-29 DIAGNOSIS — E119 Type 2 diabetes mellitus without complications: Secondary | ICD-10-CM | POA: Diagnosis not present

## 2017-09-29 DIAGNOSIS — S7012XA Contusion of left thigh, initial encounter: Secondary | ICD-10-CM | POA: Diagnosis not present

## 2017-09-29 LAB — BASIC METABOLIC PANEL
Anion gap: 8 (ref 5–15)
BUN: 14 mg/dL (ref 6–20)
CALCIUM: 8.6 mg/dL — AB (ref 8.9–10.3)
CO2: 21 mmol/L — AB (ref 22–32)
CREATININE: 1.34 mg/dL — AB (ref 0.61–1.24)
Chloride: 109 mmol/L (ref 101–111)
GFR calc Af Amer: 55 mL/min — ABNORMAL LOW (ref >60)
GFR calc non Af Amer: 47 mL/min — ABNORMAL LOW (ref >60)
GLUCOSE: 136 mg/dL — AB (ref 65–99)
Potassium: 4.2 mmol/L (ref 3.5–5.1)
Sodium: 138 mmol/L (ref 135–145)

## 2017-09-29 LAB — CBC
HEMATOCRIT: 27.8 % — AB (ref 39.0–52.0)
Hemoglobin: 8.7 g/dL — ABNORMAL LOW (ref 13.0–17.0)
MCH: 26.5 pg (ref 26.0–34.0)
MCHC: 31.3 g/dL (ref 30.0–36.0)
MCV: 84.8 fL (ref 78.0–100.0)
Platelets: 147 10*3/uL — ABNORMAL LOW (ref 150–400)
RBC: 3.28 MIL/uL — ABNORMAL LOW (ref 4.22–5.81)
RDW: 22.8 % — AB (ref 11.5–15.5)
WBC: 5.7 10*3/uL (ref 4.0–10.5)

## 2017-09-29 LAB — GLUCOSE, CAPILLARY: Glucose-Capillary: 110 mg/dL — ABNORMAL HIGH (ref 65–99)

## 2017-09-29 MED ORDER — RIVAROXABAN 15 MG PO TABS: ORAL_TABLET | ORAL | Status: DC

## 2017-09-29 MED ORDER — TOUJEO SOLOSTAR 300 UNIT/ML ~~LOC~~ SOPN: 64.0000 [IU] | PEN_INJECTOR | Freq: Every day | SUBCUTANEOUS | Status: DC

## 2017-09-29 MED ORDER — METFORMIN HCL 1000 MG PO TABS: ORAL_TABLET | ORAL | Status: DC

## 2017-09-29 MED ORDER — POLYETHYLENE GLYCOL 3350 17 G PO PACK: 17.0000 g | PACK | Freq: Every day | ORAL | Status: DC

## 2017-09-29 MED ORDER — VITAMIN C 500 MG PO TABS: ORAL_TABLET | ORAL | Status: DC

## 2017-09-29 MED ORDER — TRAMADOL HCL 50 MG PO TABS: 50.0000 mg | ORAL_TABLET | Freq: Four times a day (QID) | ORAL | 0 refills | Status: DC | PRN

## 2017-09-29 MED ORDER — B-12 2500 MCG PO TABS: ORAL_TABLET | ORAL | Status: DC

## 2017-09-29 MED ORDER — AMLODIPINE BESYLATE 2.5 MG PO TABS: ORAL_TABLET | ORAL | Status: DC

## 2017-09-29 MED ORDER — SODIUM CHLORIDE 0.9 % IV BOLUS
250.0000 mL | Freq: Once | INTRAVENOUS | Status: AC
  Administered 2017-09-29: 250 mL via INTRAVENOUS

## 2017-09-29 MED ORDER — ACETAMINOPHEN 500 MG PO TABS: ORAL_TABLET | ORAL | 0 refills | Status: DC

## 2017-09-29 MED ORDER — GABAPENTIN 300 MG PO CAPS
300.0000 mg | ORAL_CAPSULE | Freq: Three times a day (TID) | ORAL | Status: DC
  Administered 2017-09-29: 300 mg via ORAL
  Filled 2017-09-29: qty 1

## 2017-09-29 MED ORDER — LEUPROLIDE ACETATE (3 MONTH) 22.5 MG IM KIT: PACK | INTRAMUSCULAR | 0 refills | Status: DC

## 2017-09-29 MED ORDER — CALCIUM CARB-CHOLECALCIFEROL 600-800 MG-UNIT PO TABS: ORAL_TABLET | ORAL | Status: DC
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
## 2017-09-29 NOTE — Discharge Instructions (Signed)
Mechanical Wound Debridement  I would clean your right arm with soap and water.  You can get some baby shampoo.  Clean all the sites and pat dry.  Cover with xeroform gauze until all the old damaged skin has come off and the wound is dry.  Cover the xeroform with a dry dressing like the ABD pad or dry 4 x 4's.  Wrap with Kerlix to secure the dressing to your arm.  Mechanical wound debridement is a treatment to remove dead tissue from a wound. This helps the wound heal. The treatment involves cleaning the wound (irrigation) and using a pad or gauze (dressing) to remove dead tissue and debris from the wound. There are different types of mechanical wound debridement. Depending on the wound, you may need to repeat this procedure or change to another form of debridement as your wound starts to heal. Tell a health care provider about:  Any allergies you have.  All medicines you are taking, including vitamins, herbs, eye drops, creams, and over-the-counter medicines.  Any blood disorders you have.  Any medical conditions you have, including any conditions that: ? Cause a significant decrease in blood circulation to the part of the body where the wound is, such as peripheral vascular disease. ? Compromise your defense (immune) system or white blood count.  Any surgeries you have had.  Whether you are pregnant or may be pregnant. What are the risks? Generally, this is a safe procedure. However, problems may occur, including:  Infection.  Bleeding.  Damage to healthy tissue in and around your wound.  Soreness or pain.  Failure of the wound to heal.  Scarring.  What happens before the procedure? You may be given antibiotic medicine to help prevent infection. What happens during the procedure?  Your health care provider may apply a numbing medicine (topical anesthetic) to the wound.  Your health care provider will irrigate your wound with a germ-free (sterile), salt-water (saline)  solution. This removes debris, bacteria, and dead tissue.  Depending on what type of mechanical wound debridement you are having, your health care provider may do one of the following: ? Put a dressing on your wound. You may have dry gauze pad placed into the wound. Your health care provider will remove the gauze after the wound is dry. Any dead tissue and debris that has dried into the gauze will be lifted out of the wound (wet-to-dry debridement). ? Use a type of pad (monofilament fiber debridement pad). This pad has a fluffy surface on one side that picks up dead tissue and debris from your wound. Your health care provider wets the pad and wipes it over your wound for several minutes. ? Irrigate your wound with a pressurized stream of solution such as saline or water.  Once your health care provider is finished, he or she may apply a light dressing to your wound. The procedure may vary among health care providers and hospitals. What happens after the procedure?  You may receive medicine for pain.  You will continue to receive antibiotic medicine if it was started before your procedure. This information is not intended to replace advice given to you by your health care provider. Make sure you discuss any questions you have with your health care provider. Document Released: 12/31/2014 Document Revised: 09/17/2015 Document Reviewed: 08/20/2014 Elsevier Interactive Patient Education  2018 [place] your blood pressure four times a day and record. Check your glucose 4 times a day and record.  Take this with  you to Dr. Shelia Media next week.
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
## 2017-09-30 ENCOUNTER — Telehealth: Payer: Self-pay | Admitting: Oncology

## 2017-09-30 NOTE — Telephone Encounter (Signed)
Spoke with patients daughter regarding appointment change for 7/10 per template change

## 2017-10-02 ENCOUNTER — Encounter (HOSPITAL_COMMUNITY): Payer: Self-pay | Admitting: Urology

## 2017-10-02 DIAGNOSIS — Z09 Encounter for follow-up examination after completed treatment for conditions other than malignant neoplasm: Secondary | ICD-10-CM | POA: Diagnosis not present

## 2017-10-02 DIAGNOSIS — E119 Type 2 diabetes mellitus without complications: Secondary | ICD-10-CM | POA: Diagnosis not present

## 2017-10-02 DIAGNOSIS — I10 Essential (primary) hypertension: Secondary | ICD-10-CM | POA: Diagnosis not present
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
## 2017-10-06 DIAGNOSIS — M7981 Nontraumatic hematoma of soft tissue: Secondary | ICD-10-CM | POA: Diagnosis not present

## 2017-10-06 DIAGNOSIS — I10 Essential (primary) hypertension: Secondary | ICD-10-CM | POA: Diagnosis not present

## 2017-10-06 DIAGNOSIS — C8590 Non-Hodgkin lymphoma, unspecified, unspecified site: Secondary | ICD-10-CM | POA: Diagnosis not present

## 2017-10-08 ENCOUNTER — Inpatient Hospital Stay (HOSPITAL_COMMUNITY)
Admission: EM | Admit: 2017-10-08 | Discharge: 2017-10-10 | DRG: 312 | Disposition: A | Discharge status: Home / Self Care | Payer: Medicare Other | Attending: Internal Medicine | Admitting: Internal Medicine

## 2017-10-08 ENCOUNTER — Emergency Department (HOSPITAL_COMMUNITY): Payer: Medicare Other

## 2017-10-08 ENCOUNTER — Encounter (HOSPITAL_COMMUNITY): Payer: Self-pay

## 2017-10-08 DIAGNOSIS — Z7901 Long term (current) use of anticoagulants: Secondary | ICD-10-CM | POA: Diagnosis not present

## 2017-10-08 DIAGNOSIS — I951 Orthostatic hypotension: Secondary | ICD-10-CM | POA: Diagnosis not present

## 2017-10-08 DIAGNOSIS — K529 Noninfective gastroenteritis and colitis, unspecified: Secondary | ICD-10-CM | POA: Diagnosis present

## 2017-10-08 DIAGNOSIS — Z794 Long term (current) use of insulin: Secondary | ICD-10-CM

## 2017-10-08 DIAGNOSIS — D696 Thrombocytopenia, unspecified: Secondary | ICD-10-CM | POA: Diagnosis present

## 2017-10-08 DIAGNOSIS — E785 Hyperlipidemia, unspecified: Secondary | ICD-10-CM | POA: Diagnosis present

## 2017-10-08 DIAGNOSIS — Z87442 Personal history of urinary calculi: Secondary | ICD-10-CM

## 2017-10-08 DIAGNOSIS — C8599 Non-Hodgkin lymphoma, unspecified, extranodal and solid organ sites: Secondary | ICD-10-CM | POA: Diagnosis present

## 2017-10-08 DIAGNOSIS — F419 Anxiety disorder, unspecified: Secondary | ICD-10-CM | POA: Diagnosis present

## 2017-10-08 DIAGNOSIS — Z66 Do not resuscitate: Secondary | ICD-10-CM | POA: Diagnosis present

## 2017-10-08 DIAGNOSIS — R55 Syncope and collapse: Secondary | ICD-10-CM | POA: Diagnosis present

## 2017-10-08 DIAGNOSIS — E1140 Type 2 diabetes mellitus with diabetic neuropathy, unspecified: Secondary | ICD-10-CM | POA: Diagnosis present

## 2017-10-08 DIAGNOSIS — Z888 Allergy status to other drugs, medicaments and biological substances status: Secondary | ICD-10-CM

## 2017-10-08 DIAGNOSIS — C61 Malignant neoplasm of prostate: Secondary | ICD-10-CM | POA: Diagnosis present

## 2017-10-08 DIAGNOSIS — Z85828 Personal history of other malignant neoplasm of skin: Secondary | ICD-10-CM

## 2017-10-08 DIAGNOSIS — M109 Gout, unspecified: Secondary | ICD-10-CM | POA: Diagnosis present

## 2017-10-08 DIAGNOSIS — D630 Anemia in neoplastic disease: Secondary | ICD-10-CM | POA: Diagnosis present

## 2017-10-08 DIAGNOSIS — Z8249 Family history of ischemic heart disease and other diseases of the circulatory system: Secondary | ICD-10-CM

## 2017-10-08 DIAGNOSIS — R0902 Hypoxemia: Secondary | ICD-10-CM | POA: Diagnosis not present

## 2017-10-08 DIAGNOSIS — F329 Major depressive disorder, single episode, unspecified: Secondary | ICD-10-CM | POA: Diagnosis present

## 2017-10-08 DIAGNOSIS — I480 Paroxysmal atrial fibrillation: Secondary | ICD-10-CM | POA: Diagnosis present

## 2017-10-08 DIAGNOSIS — J9811 Atelectasis: Secondary | ICD-10-CM | POA: Diagnosis not present

## 2017-10-08 DIAGNOSIS — S80211A Abrasion, right knee, initial encounter: Secondary | ICD-10-CM | POA: Diagnosis not present

## 2017-10-08 DIAGNOSIS — I959 Hypotension, unspecified: Secondary | ICD-10-CM | POA: Diagnosis not present

## 2017-10-08 DIAGNOSIS — Z87891 Personal history of nicotine dependence: Secondary | ICD-10-CM

## 2017-10-08 DIAGNOSIS — R42 Dizziness and giddiness: Secondary | ICD-10-CM | POA: Diagnosis not present

## 2017-10-08 DIAGNOSIS — D649 Anemia, unspecified: Secondary | ICD-10-CM | POA: Diagnosis not present

## 2017-10-08 DIAGNOSIS — I10 Essential (primary) hypertension: Secondary | ICD-10-CM | POA: Diagnosis present

## 2017-10-08 DIAGNOSIS — E1110 Type 2 diabetes mellitus with ketoacidosis without coma: Secondary | ICD-10-CM

## 2017-10-08 DIAGNOSIS — E1165 Type 2 diabetes mellitus with hyperglycemia: Secondary | ICD-10-CM | POA: Diagnosis not present

## 2017-10-08 DIAGNOSIS — Z79899 Other long term (current) drug therapy: Secondary | ICD-10-CM | POA: Diagnosis not present

## 2017-10-08 DIAGNOSIS — W19XXXA Unspecified fall, initial encounter: Secondary | ICD-10-CM | POA: Diagnosis not present

## 2017-10-08 DIAGNOSIS — N179 Acute kidney failure, unspecified: Secondary | ICD-10-CM | POA: Diagnosis present

## 2017-10-08 HISTORY — DX: Chronic kidney disease, unspecified: N18.9

## 2017-10-08 HISTORY — DX: Cardiac arrhythmia, unspecified: I49.9

## 2017-10-08 LAB — CBC WITH DIFFERENTIAL/PLATELET
BASOS PCT: 0 %
Basophils Absolute: 0 10*3/uL (ref 0.0–0.1)
EOS PCT: 1 %
Eosinophils Absolute: 0.1 10*3/uL (ref 0.0–0.7)
HEMATOCRIT: 26.3 % — AB (ref 39.0–52.0)
HEMOGLOBIN: 8.1 g/dL — AB (ref 13.0–17.0)
LYMPHS ABS: 0.2 10*3/uL — AB (ref 0.7–4.0)
Lymphocytes Relative: 2 %
MCH: 27.6 pg (ref 26.0–34.0)
MCHC: 30.8 g/dL (ref 30.0–36.0)
MCV: 89.8 fL (ref 78.0–100.0)
MONO ABS: 0.5 10*3/uL (ref 0.1–1.0)
MONOS PCT: 5 %
Neutro Abs: 9.5 10*3/uL — ABNORMAL HIGH (ref 1.7–7.7)
Neutrophils Relative %: 92 %
Platelets: 129 10*3/uL — ABNORMAL LOW (ref 150–400)
RBC: 2.93 MIL/uL — AB (ref 4.22–5.81)
RDW: 21.2 % — ABNORMAL HIGH (ref 11.5–15.5)
WBC Morphology: INCREASED
WBC: 10.3 10*3/uL (ref 4.0–10.5)

## 2017-10-08 LAB — BASIC METABOLIC PANEL
Anion gap: 9 (ref 5–15)
BUN: 35 mg/dL — AB (ref 6–20)
CHLORIDE: 107 mmol/L (ref 101–111)
CO2: 18 mmol/L — AB (ref 22–32)
CREATININE: 1.45 mg/dL — AB (ref 0.61–1.24)
Calcium: 8.3 mg/dL — ABNORMAL LOW (ref 8.9–10.3)
GFR calc Af Amer: 50 mL/min — ABNORMAL LOW (ref >60)
GFR calc non Af Amer: 43 mL/min — ABNORMAL LOW (ref >60)
GLUCOSE: 270 mg/dL — AB (ref 65–99)
POTASSIUM: 3.8 mmol/L (ref 3.5–5.1)
Sodium: 134 mmol/L — ABNORMAL LOW (ref 135–145)

## 2017-10-08 LAB — I-STAT TROPONIN, ED: Troponin i, poc: 0.01 ng/mL (ref 0.00–0.08)

## 2017-10-08 LAB — CBG MONITORING, ED: Glucose-Capillary: 251 mg/dL — ABNORMAL HIGH (ref 65–99)

## 2017-10-08 MED ORDER — HEPARIN SODIUM (PORCINE) 5000 UNIT/ML IJ SOLN
5000.0000 [IU] | Freq: Three times a day (TID) | INTRAMUSCULAR | Status: DC
  Administered 2017-10-09 – 2017-10-10 (×4): 5000 [IU] via SUBCUTANEOUS
  Filled 2017-10-08 (×5): qty 1

## 2017-10-08 MED ORDER — INSULIN ASPART 100 UNIT/ML ~~LOC~~ SOLN
0.0000 [IU] | Freq: Every day | SUBCUTANEOUS | Status: DC
  Administered 2017-10-09: 2 [IU] via SUBCUTANEOUS

## 2017-10-08 MED ORDER — INSULIN DETEMIR 100 UNIT/ML ~~LOC~~ SOLN
5.0000 [IU] | Freq: Every day | SUBCUTANEOUS | Status: DC
  Administered 2017-10-09 – 2017-10-10 (×2): 5 [IU] via SUBCUTANEOUS
  Filled 2017-10-08 (×2): qty 0.05

## 2017-10-08 MED ORDER — LACTATED RINGERS IV SOLN
INTRAVENOUS | Status: AC
  Administered 2017-10-09 (×2): via INTRAVENOUS

## 2017-10-08 MED ORDER — ACETAMINOPHEN 325 MG PO TABS: 650.0000 mg | ORAL_TABLET | Freq: Four times a day (QID) | ORAL | Status: DC | PRN

## 2017-10-08 MED ORDER — INSULIN ASPART 100 UNIT/ML ~~LOC~~ SOLN
0.0000 [IU] | Freq: Three times a day (TID) | SUBCUTANEOUS | Status: DC
  Administered 2017-10-09 (×3): 2 [IU] via SUBCUTANEOUS
  Administered 2017-10-10: 3 [IU] via SUBCUTANEOUS
  Administered 2017-10-10: 2 [IU] via SUBCUTANEOUS

## 2017-10-08 MED ORDER — GABAPENTIN 100 MG PO CAPS
100.0000 mg | ORAL_CAPSULE | Freq: Three times a day (TID) | ORAL | Status: DC
  Administered 2017-10-09 – 2017-10-10 (×5): 100 mg via ORAL
  Filled 2017-10-08 (×5): qty 1

## 2017-10-08 MED ORDER — ALLOPURINOL 300 MG PO TABS
300.0000 mg | ORAL_TABLET | Freq: Every day | ORAL | Status: DC
  Administered 2017-10-09 – 2017-10-10 (×2): 300 mg via ORAL
  Filled 2017-10-08 (×2): qty 1

## 2017-10-08 MED ORDER — SODIUM CHLORIDE 0.9 % IV BOLUS
1000.0000 mL | Freq: Once | INTRAVENOUS | Status: AC
  Administered 2017-10-08: 1000 mL via INTRAVENOUS

## 2017-10-08 MED ORDER — VITAMIN B-12 1000 MCG PO TABS
2500.0000 ug | ORAL_TABLET | Freq: Every day | ORAL | Status: DC
  Administered 2017-10-09 – 2017-10-10 (×2): 2500 ug via ORAL
  Filled 2017-10-08 (×2): qty 3

## 2017-10-08 MED ORDER — SERTRALINE HCL 100 MG PO TABS
100.0000 mg | ORAL_TABLET | Freq: Every day | ORAL | Status: DC
  Administered 2017-10-09 – 2017-10-10 (×2): 100 mg via ORAL
  Filled 2017-10-08 (×2): qty 1

## 2017-10-08 MED ORDER — ATORVASTATIN CALCIUM 20 MG PO TABS
20.0000 mg | ORAL_TABLET | Freq: Every evening | ORAL | Status: DC
  Administered 2017-10-09 (×2): 20 mg via ORAL
  Filled 2017-10-08 (×2): qty 1

## 2017-10-08 MED ORDER — LATANOPROST 0.005 % OP SOLN
1.0000 [drp] | Freq: Every day | OPHTHALMIC | Status: DC
  Administered 2017-10-09 (×2): 1 [drp] via OPHTHALMIC
  Filled 2017-10-08: qty 2.5

## 2017-10-08 NOTE — ED Notes (Signed)
Patient transported to CT 

## 2017-10-08 NOTE — ED Notes (Signed)
Report attempted 

## 2017-10-08 NOTE — ED Notes (Signed)
Requested assistance from tech to help pt with cleaning up.

## 2017-10-08 NOTE — ED Notes (Signed)
Called CT to let them know pt had been cleaned up and ready for transport.

## 2017-10-08 NOTE — ED Provider Notes (Signed)
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 pressure and being so symptomatic and I think that is reasonable.   [MB]  2224 Discussed with Dr. Stana Bunting from the hospitalist service who will the patient for admission.   [MB]    Clinical Course User Index [MB] Hayden Rasmussen, MD     Final Clinical Impressions(s) / ED Diagnoses   Final diagnoses:  Syncope and collapse  Hypotension, unspecified hypotension type  Anemia, unspecified type  Abrasion of right knee, initial encounter    ED Discharge Orders    None       Hayden Rasmussen, MD 10/09/17 1123

## 2017-10-08 NOTE — ED Triage Notes (Signed)
EMS reported pt became dizzy and had a syncopal episode. First bp 82/48. Family reports LOC of 30 secs. Pt reports pain to back of head and R knee. EMS gave 1000 ml NS and 4 mg of zofran. Pt takes xeralto.
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
## 2017-10-08 NOTE — ED Notes (Signed)
Delay in lab draw,  Pt not in room at this time. 

## 2017-10-09 ENCOUNTER — Other Ambulatory Visit (HOSPITAL_COMMUNITY): Payer: BLUE CROSS/BLUE SHIELD

## 2017-10-09 ENCOUNTER — Encounter (HOSPITAL_COMMUNITY): Payer: Self-pay | Admitting: *Deleted

## 2017-10-09 ENCOUNTER — Other Ambulatory Visit: Payer: Self-pay

## 2017-10-09 DIAGNOSIS — N179 Acute kidney failure, unspecified: Secondary | ICD-10-CM

## 2017-10-09 DIAGNOSIS — D649 Anemia, unspecified: Secondary | ICD-10-CM

## 2017-10-09 DIAGNOSIS — I959 Hypotension, unspecified: Secondary | ICD-10-CM

## 2017-10-09 DIAGNOSIS — R55 Syncope and collapse: Secondary | ICD-10-CM

## 2017-10-09 LAB — BASIC METABOLIC PANEL
Anion gap: 8 (ref 5–15)
BUN: 30 mg/dL — ABNORMAL HIGH (ref 6–20)
CO2: 20 mmol/L — AB (ref 22–32)
Calcium: 8.8 mg/dL — ABNORMAL LOW (ref 8.9–10.3)
Chloride: 110 mmol/L (ref 101–111)
Creatinine, Ser: 1.32 mg/dL — ABNORMAL HIGH (ref 0.61–1.24)
GFR, EST AFRICAN AMERICAN: 56 mL/min — AB (ref >60)
GFR, EST NON AFRICAN AMERICAN: 48 mL/min — AB (ref >60)
GLUCOSE: 173 mg/dL — AB (ref 65–99)
POTASSIUM: 3.7 mmol/L (ref 3.5–5.1)
Sodium: 138 mmol/L (ref 135–145)

## 2017-10-09 LAB — GLUCOSE, CAPILLARY
GLUCOSE-CAPILLARY: 173 mg/dL — AB (ref 65–99)
Glucose-Capillary: 188 mg/dL — ABNORMAL HIGH (ref 65–99)
Glucose-Capillary: 192 mg/dL — ABNORMAL HIGH (ref 65–99)
Glucose-Capillary: 194 mg/dL — ABNORMAL HIGH (ref 65–99)
Glucose-Capillary: 223 mg/dL — ABNORMAL HIGH (ref 65–99)

## 2017-10-09 LAB — CBC
HEMATOCRIT: 25.2 % — AB (ref 39.0–52.0)
HEMOGLOBIN: 8 g/dL — AB (ref 13.0–17.0)
MCH: 27.9 pg (ref 26.0–34.0)
MCHC: 31.7 g/dL (ref 30.0–36.0)
MCV: 87.8 fL (ref 78.0–100.0)
Platelets: 117 10*3/uL — ABNORMAL LOW (ref 150–400)
RBC: 2.87 MIL/uL — AB (ref 4.22–5.81)
RDW: 21.2 % — ABNORMAL HIGH (ref 11.5–15.5)
WBC: 7.6 10*3/uL (ref 4.0–10.5)

## 2017-10-09 LAB — IRON AND TIBC
Iron: 23 ug/dL — ABNORMAL LOW (ref 45–182)
Saturation Ratios: 7 % — ABNORMAL LOW (ref 17.9–39.5)
TIBC: 337 ug/dL (ref 250–450)
UIBC: 314 ug/dL

## 2017-10-09 LAB — RETICULOCYTES
RBC.: 2.87 MIL/uL — AB (ref 4.22–5.81)
RETIC COUNT ABSOLUTE: 100.5 10*3/uL (ref 19.0–186.0)
Retic Ct Pct: 3.5 % — ABNORMAL HIGH (ref 0.4–3.1)

## 2017-10-09 LAB — TSH: TSH: 6.807 u[IU]/mL — ABNORMAL HIGH (ref 0.350–4.500)

## 2017-10-09 LAB — CORTISOL: Cortisol, Plasma: 11.4 ug/dL

## 2017-10-09 LAB — VITAMIN B12: VITAMIN B 12: 1086 pg/mL — AB (ref 180–914)

## 2017-10-09 LAB — FERRITIN: FERRITIN: 76 ng/mL (ref 24–336)

## 2017-10-09 MED ORDER — PANCRELIPASE (LIP-PROT-AMYL) 12000-38000 UNITS PO CPEP
12000.0000 [IU] | ORAL_CAPSULE | Freq: Three times a day (TID) | ORAL | Status: DC
  Administered 2017-10-09 – 2017-10-10 (×4): 12000 [IU] via ORAL
  Filled 2017-10-09 (×4): qty 1

## 2017-10-09 MED ORDER — CHOLESTYRAMINE 4 G PO PACK
4.0000 g | PACK | ORAL | Status: DC | PRN
  Filled 2017-10-09: qty 1

## 2017-10-09 MED ORDER — FENOFIBRATE 160 MG PO TABS
160.0000 mg | ORAL_TABLET | Freq: Every evening | ORAL | Status: DC
  Administered 2017-10-09: 160 mg via ORAL
  Filled 2017-10-09: qty 1

## 2017-10-09 NOTE — Plan of Care (Signed)
  Problem: Education: Goal: Knowledge of General Education information will improve Outcome: Completed/Met
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
## 2017-10-10 ENCOUNTER — Inpatient Hospital Stay (HOSPITAL_COMMUNITY): Payer: Medicare Other

## 2017-10-10 DIAGNOSIS — R55 Syncope and collapse: Secondary | ICD-10-CM

## 2017-10-10 LAB — BASIC METABOLIC PANEL
Anion gap: 8 (ref 5–15)
BUN: 24 mg/dL — AB (ref 6–20)
CALCIUM: 9 mg/dL (ref 8.9–10.3)
CHLORIDE: 110 mmol/L (ref 101–111)
CO2: 22 mmol/L (ref 22–32)
CREATININE: 1.2 mg/dL (ref 0.61–1.24)
GFR calc Af Amer: >60 mL/min (ref >60)
GFR calc non Af Amer: 54 mL/min — ABNORMAL LOW (ref >60)
Glucose, Bld: 151 mg/dL — ABNORMAL HIGH (ref 65–99)
Potassium: 3.6 mmol/L (ref 3.5–5.1)
SODIUM: 140 mmol/L (ref 135–145)

## 2017-10-10 LAB — ECHOCARDIOGRAM COMPLETE
HEIGHTINCHES: 68 in
WEIGHTICAEL: 2668.8 [oz_av]

## 2017-10-10 LAB — GLUCOSE, CAPILLARY
Glucose-Capillary: 188 mg/dL — ABNORMAL HIGH (ref 65–99)
Glucose-Capillary: 211 mg/dL — ABNORMAL HIGH (ref 65–99)

## 2017-10-10 NOTE — Progress Notes (Signed)
Discharge instruction was given to pt.  Pt is awaiting for his ride home.  Idolina Primer, RN
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
## 2017-10-10 NOTE — Progress Notes (Signed)
  Echocardiogram 2D Echocardiogram has been performed.  [name] 10/10/2017, 12:10 PM
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
## 2017-10-10 NOTE — Progress Notes (Signed)
Received a call back from Pt's daughter, [name] and informed of his discharge. [name] would like to talk with Dr. Maryland Pink about discharge instruction regarding his low BP.  Dr. Maryland Pink made aware.  Idolina Primer, RN

## 2017-10-10 NOTE — Progress Notes (Signed)
Discharge instruction was given to pt. Belongings were sent home with pt and family.  Idolina Primer, RN

## 2017-10-10 NOTE — Discharge Instructions (Signed)
Orthostatic Hypotension Orthostatic hypotension is a sudden drop in blood pressure that happens when you quickly change positions, such as when you get up from a seated or lying position. Blood pressure is a measurement of how strongly, or weakly, your blood is pressing against the walls of your arteries. Arteries are blood vessels that carry blood from your heart throughout your body. When blood pressure is too low, you may not get enough blood to your brain or to the rest of your organs. This can cause weakness, light-headedness, rapid heartbeat, and fainting. This can last for just a few seconds or for up to a few minutes. Orthostatic hypotension is usually not a serious problem. However, if it happens frequently or gets worse, it may be a sign of something more serious. What are the causes? This condition may be caused by:  Sudden changes in posture, such as standing up quickly after you have been sitting or lying down.  Blood loss.  Loss of body fluids (dehydration).  Heart problems.  Hormone (endocrine) problems.  Pregnancy.  Severe infection.  Lack of certain nutrients.  Severe allergic reactions (anaphylaxis).  Certain medicines, such as blood pressure medicine or medicines that make the body lose excess fluids (diuretics). Sometimes, this condition can be caused by not taking medicine as directed, such as taking too much of a certain medicine.  What increases the risk? Certain factors can make you more likely to develop orthostatic hypotension, including:  Age. Risk increases as you get older.  Conditions that affect the heart or the central nervous system.  Taking certain medicines, such as blood pressure medicine or diuretics.  Being pregnant.  What are the signs or symptoms? Symptoms of this condition may include:  Weakness.  Light-headedness.  Dizziness.  Blurred vision.  Fatigue.  Rapid heartbeat.  Fainting, in severe cases.  How is this  diagnosed? This condition is diagnosed based on:  Your medical history.  Your symptoms.  Your blood pressure measurement. Your health care provider will check your blood pressure when you are: ? Lying down. ? Sitting. ? Standing.  A blood pressure reading is recorded as two numbers, such as "120 over 80" (or 120/80). The first ("top") number is called the systolic pressure. It is a measure of the pressure in your arteries as your heart beats. The second ("bottom") number is called the diastolic pressure. It is a measure of the pressure in your arteries when your heart relaxes between beats. Blood pressure is measured in a unit called mm Hg. Healthy blood pressure for adults is 120/80. If your blood pressure is below 90/60, you may be diagnosed with hypotension. Other information or tests that may be used to diagnose orthostatic hypotension include:  Your other vital signs, such as your heart rate and temperature.  Blood tests.  Tilt table test. For this test, you will be safely secured to a table that moves you from a lying position to an upright position. Your heart rhythm and blood pressure will be monitored during the test.  How is this treated? Treatment for this condition may include:  Changing your diet. This may involve eating more salt (sodium) or drinking more water.  Taking medicines to raise your blood pressure.  Changing the dosage of certain medicines you are taking that might be lowering your blood pressure.  Wearing compression stockings. These stockings help to prevent blood clots and reduce swelling in your legs.  In some cases, you may need to go to the hospital for:  Fluid replacement. This means you will receive fluids through an IV tube.  Blood replacement. This means you will receive donated blood through an IV tube (transfusion).  Treating an infection or heart problems, if this applies.  Monitoring. You may need to be monitored while medicines that you  are taking wear off.  Follow these instructions at home: Eating and drinking   Drink enough fluid to keep your urine clear or pale yellow.  Eat a healthy diet and follow instructions from your health care provider about eating or drinking restrictions. A healthy diet includes: ? Fresh fruits and vegetables. ? Whole grains. ? Lean meats. ? Low-fat dairy products.  Eat extra salt only as directed. Do not add extra salt to your diet unless your health care provider told you to do that.  Eat frequent, small meals.  Avoid standing up suddenly after eating. Medicines  Take over-the-counter and prescription medicines only as told by your health care provider. ? Follow instructions from your health care provider about changing the dosage of your current medicines, if this applies. ? Do not stop or adjust any of your medicines on your own. General instructions  Wear compression stockings as told by your health care provider.  Get up slowly from lying down or sitting positions. This gives your blood pressure a chance to adjust.  Avoid hot showers and excessive heat as directed by your health care provider.  Return to your normal activities as told by your health care provider. Ask your health care provider what activities are safe for you.  Do not use any products that contain nicotine or tobacco, such as cigarettes and e-cigarettes. If you need help quitting, ask your health care provider.  Keep all follow-up visits as told by your health care provider. This is important. Contact a health care provider if:  You vomit.  You have diarrhea.  You have a fever for more than 2-3 days.  You feel more thirsty than usual.  You feel weak and tired. Get help right away if:  You have chest pain.  You have a fast or irregular heartbeat.  You develop numbness in any part of your body.  You cannot move your arms or your legs.  You have trouble speaking.  You become sweaty or feel  lightheaded.  You faint.  You feel short of breath.  You have trouble staying awake.  You feel confused. This information is not intended to replace advice given to you by your health care provider. Make sure you discuss any questions you have with your health care provider. Document Released: 04/01/2002 Document Revised: 12/29/2015 Document Reviewed: 10/02/2015 Elsevier Interactive Patient Education  2018 [employer].   Syncope Syncope is when you lose temporarily pass out (faint). Signs that you may be about to pass out include:  Feeling dizzy or light-headed.  Feeling sick to your stomach (nauseous).  Seeing all white or all black.  Having cold, clammy skin.  If you passed out, get help right away. Call your local emergency services (911 in the [place] Do not drive yourself to the hospital. Follow these instructions at home: Pay attention to any changes in your symptoms. Take these actions to help with your condition:  Have someone stay with you until you feel stable.  Do not drive, use machinery, or play sports until your doctor says it is okay.  Keep all follow-up visits as told by your doctor. This is important.  If you start to feel like you might pass  out, lie down right away and raise (elevate) your feet above the level of your heart. Breathe deeply and steadily. Wait until all of the symptoms are gone.  Drink enough fluid to keep your pee (urine) clear or pale yellow.  If you are taking blood pressure or heart medicine, get up slowly and spend many minutes getting ready to sit and then stand. This can help with dizziness.  Take over-the-counter and prescription medicines only as told by your doctor.  Get help right away if:  You have a very bad headache.  You have unusual pain in your chest, tummy, or back.  You are bleeding from your mouth or rectum.  You have black or tarry poop (stool).  You have a very fast or uneven heartbeat (palpitations).  It  hurts to breathe.  You pass out once or more than once.  You have jerky movements that you cannot control (seizure).  You are confused.  You have trouble walking.  You are very weak.  You have vision problems. These symptoms may be an emergency. Do not wait to see if the symptoms will go away. Get medical help right away. Call your local emergency services (911 in the [place] Do not drive yourself to the hospital. This information is not intended to replace advice given to you by your health care provider. Make sure you discuss any questions you have with your health care provider. Document Released: 09/28/2007 Document Revised: 09/17/2015 Document Reviewed: 12/24/2014 Elsevier Interactive Patient Education  [employer].

## 2017-10-10 NOTE — Discharge Summary (Signed)
[facility]  Physician Discharge Summary   Patient ID: [name] MRN: [id number] DOB/AGE: [birth year] 84 y.o.  Admit date: 10/08/2017 Discharge date: 10/10/2017  PCP: [name], MD  DISCHARGE DIAGNOSES:  Syncope likely due to orthostatic hypotension  RECOMMENDATIONS FOR OUTPATIENT FOLLOW UP: 1. Follow-up with PCP within 1 week 2. Repeat TSH in a few weeks.   DISCHARGE CONDITION: fair  Diet recommendation: As before  Filed Weights   10/08/17 2352 10/09/17 0504 10/10/17 0517  Weight: 78.8 kg (173 lb 11.6 oz) 77.9 kg (171 lb 11.2 oz) 75.7 kg (166 lb 12.8 oz)    INITIAL HISTORY: 84 year old male with a past medical history including prostate cancer, non-Hodgkin's lymphoma on maintenance rituximab which he gets every 8 weeks, atrial fibrillation on anticoagulation with Xarelto, Hypertension, Insulin-dependent diabetes, Recent hospitalization for traumatic injury related to tractor accident.  Patient presented after he had a syncopal episode at home.  He was sitting outside in associated patio.  He stood up, became lightheaded and then passed out.  Denies any chest pain shortness of breath.  He was brought into the hospital for further evaluation.  There was some concern for worse than usual diarrhea.  Consultations:  None  Procedures:  Echocardiogram Study Conclusions  - Left ventricle: The cavity size was normal. Wall thickness was   increased in a pattern of mild LVH. Systolic function was   vigorous. The estimated ejection fraction was in the range of 65%   to 70%. Wall motion was normal; there were no regional wall   motion abnormalities. Doppler parameters are consistent with   abnormal left ventricular relaxation (grade 1 diastolic   dysfunction). The E/e&' ratio is between 8-15, suggesting   indeterminate LV filling pressure. - Aortic valve: Sclerosis without stenosis. There was no   regurgitation. - Mitral valve: Mildly thickened leaflets .  There was trivial   regurgitation. - Left atrium: The atrium was normal in size. - Inferior vena cava: The vessel was normal in size. The   respirophasic diameter changes were in the normal range (>= 50%),   consistent with normal central venous pressure. Impressions: - Compared to a prior study in 2016, the LVEF is higher at 65-70%.   The aortic valve is sclerotic, but not stenotic.   HOSPITAL COURSE:   Syncope Symptoms suggestive of orthostatic hypotension.  Blood pressure noted to be borderline low.  His blood pressure medications were held.  Patient was hydrated. Patient denied any GI illness recently.  He does have chronic diarrhea.    Did not have any significant diarrhea in the hospital.  He is very active individual otherwise.  EKG did not show any ischemic changes.  Orthostatic vital signs were normal.  Echocardiogram was performed.  Systolic function noted to be normal.  Grade 1 diastolic dysfunction.  Mild valvular disease noted.  Patient seen by physical therapy and ambulated without any difficulty.  Cortisol levels were normal.  TSH 6.8.  Patient denies any symptoms suggestive of hypothyroidism.  This can be rechecked in the outpatient setting.  Acute kidney injury Creatinine was 1.45 at admission.  BUN elevated to 35.  Patient started on IV fluids.  BUN and creatinine have improved.  Patient told to stay hydrated at home.  Diarrhea, chronic Patient has chronic diarrhea per history.  Tells me that he has been having loose stools ever since he was diagnosed with his lymphoma and was started on chemotherapy.  Denies any recent worsening of his diarrhea. Did not have any diarrhea  hospital.  Normocytic anemia Patient has anemia of chronic disease.  Hemoglobin close to baseline.  Anemia panel was done which shows ferritin to be 76.  Vitamin B12 1086.  Insulin-dependent diabetes mellitus Continue home medication regimen  History of non-Hodgkin's lymphoma Followed by Dr.  Jana Hakim with oncology.  Patient is on maintenance rituximab.  He gets that every 8 weeks. Last administered on 09/07/17.  Atrial fibrillation Rate controlled.    Continue Xarelto.  Mild thrombocytopenia Stable.   History of depression and anxiety Continue home medications.  Patient's daughter requested to talk to me.  She mentioned that patient has been having low blood pressure readings at home when checked randomly.  Sometimes it would be as low as 60.  However patient asymptomatic for the most part.  She remains concerned and requesting to see cardiology.  She is agreeable to outpatient appointment.  Patient has been followed by Dr. [name].  Last seen in 2018.  Message sent to his office good to schedule appointment.  Daughter reassured.  Blood pressure readings in the hospital have been unremarkable.  Orthostatic vital signs normal.  Overall stable.  Has ambulated many times in the hospital without any difficulties.  Okay for discharge home today.    PERTINENT LABS:  The results of significant diagnostics from this hospitalization (including imaging, microbiology, ancillary and laboratory) are listed below for reference.     Labs: Basic Metabolic Panel: Recent Labs  Lab 10/08/17 2048 10/09/17 0419 10/10/17 0409  NA 134* 138 140  K 3.8 3.7 3.6  CL 107 110 110  CO2 18* 20* 22  GLUCOSE 270* 173* 151*  BUN 35* 30* 24*  CREATININE 1.45* 1.32* 1.20  CALCIUM 8.3* 8.8* 9.0   CBC: Recent Labs  Lab 10/08/17 2048 10/09/17 0419  WBC 10.3 7.6  NEUTROABS 9.5*  --   HGB 8.1* 8.0*  HCT 26.3* 25.2*  MCV 89.8 87.8  PLT 129* 117*   CBG: Recent Labs  Lab 10/09/17 1129 10/09/17 1610 10/09/17 2043 10/10/17 0731 10/10/17 1112  GLUCAP 188* 173* 194* 188* 211*     IMAGING STUDIES Ct Head Wo Contrast  Result Date: 10/08/2017 CLINICAL DATA:  Dizzy with syncopal episode EXAM: CT HEAD WITHOUT CONTRAST CT CERVICAL SPINE WITHOUT CONTRAST TECHNIQUE: Multidetector CT imaging  of the head and cervical spine was performed following the standard protocol without intravenous contrast. Multiplanar CT image reconstructions of the cervical spine were also generated. COMPARISON:  PET CT 09/06/2017, CT brain 08/16/2013 FINDINGS: CT HEAD FINDINGS Brain: No acute territorial infarction, hemorrhage or intracranial mass is visualized. Moderate-to-marked atrophy. Slight increased hypodensity within the left anterior white matter, likely reflecting progression of small vessel ischemic disease. Stable prominent ventricles. Vascular: Densely calcified carotid arteries. Calcified ectatic vertebral arteries. No hyperdense vessels Skull: No fracture or suspicious lesion Sinuses/Orbits: Mild mucosal changes in the ethmoid sinuses. No acute orbital abnormality. Other: None CT CERVICAL SPINE FINDINGS Alignment: Facet alignment within normal limits.  No subluxation. Skull base and vertebrae: No acute fracture. No primary bone lesion or focal pathologic process. Soft tissues and spinal canal: No prevertebral fluid or swelling. No visible canal hematoma. Disc levels: Moderate severe degenerative changes C4-C5, C5-C6, C6-C7 and C7 T1. Mild degenerative changes C2-C3 and C3-C4. Multiple level bilateral facet degenerative change. Upper chest: Lung apices are clear. 1 cm thyroid nodule on the left. Additional subcentimeter hypodense nodules in the left lobe. Other: None IMPRESSION: 1. No CT evidence for acute intracranial abnormality. Atrophy with small vessel ischemic changes of the white  matter, slight progression since comparison head CT from 2015 2. Moderate-to-marked multilevel degenerative changes of the spine without acute osseous abnormality. Electronically Signed   By: Donavan Foil M.D.   On: 10/08/2017 20:30   Ct Cervical Spine Wo Contrast  Result Date: 10/08/2017 CLINICAL DATA:  Dizzy with syncopal episode EXAM: CT HEAD WITHOUT CONTRAST CT CERVICAL SPINE WITHOUT CONTRAST TECHNIQUE: Multidetector CT  imaging of the head and cervical spine was performed following the standard protocol without intravenous contrast. Multiplanar CT image reconstructions of the cervical spine were also generated. COMPARISON:  PET CT 09/06/2017, CT brain 08/16/2013 FINDINGS: CT HEAD FINDINGS Brain: No acute territorial infarction, hemorrhage or intracranial mass is visualized. Moderate-to-marked atrophy. Slight increased hypodensity within the left anterior white matter, likely reflecting progression of small vessel ischemic disease. Stable prominent ventricles. Vascular: Densely calcified carotid arteries. Calcified ectatic vertebral arteries. No hyperdense vessels Skull: No fracture or suspicious lesion Sinuses/Orbits: Mild mucosal changes in the ethmoid sinuses. No acute orbital abnormality. Other: None CT CERVICAL SPINE FINDINGS Alignment: Facet alignment within normal limits.  No subluxation. Skull base and vertebrae: No acute fracture. No primary bone lesion or focal pathologic process. Soft tissues and spinal canal: No prevertebral fluid or swelling. No visible canal hematoma. Disc levels: Moderate severe degenerative changes C4-C5, C5-C6, C6-C7 and C7 T1. Mild degenerative changes C2-C3 and C3-C4. Multiple level bilateral facet degenerative change. Upper chest: Lung apices are clear. 1 cm thyroid nodule on the left. Additional subcentimeter hypodense nodules in the left lobe. Other: None IMPRESSION: 1. No CT evidence for acute intracranial abnormality. Atrophy with small vessel ischemic changes of the white matter, slight progression since comparison head CT from 2015 2. Moderate-to-marked multilevel degenerative changes of the spine without acute osseous abnormality. Electronically Signed   By: Donavan Foil M.D.   On: 10/08/2017 20:30   Dg Chest Port 1 View  Result Date: 10/08/2017 CLINICAL DATA:  Syncope EXAM: PORTABLE CHEST 1 VIEW COMPARISON:  CT AP 09/06/2017 CXR 02/04/2015 FINDINGS: The patient is rotated to the left  on the exam. Heart is top-normal in size. There is mild aortic atherosclerosis. Port catheter tip is seen in the proximal right atrium there is atelectasis at the lung bases. No overt pulmonary edema. Slight blunting the left lateral costophrenic angle may reflect a tiny left effusion. No acute nor suspicious osseous lesions. IMPRESSION: Stable mild cardiomegaly with aortic atherosclerosis. Port catheter tip in the proximal right atrium. Bibasilar atelectasis. Electronically Signed   By: Ashley Royalty M.D.   On: 10/08/2017 19:28    DISCHARGE EXAMINATION: Vitals:   10/09/17 1543 10/09/17 1611 10/09/17 2045 10/10/17 0517  BP: 109/71 115/60 (!) 108/55 124/62  Pulse:  64 67 68  Resp:  20 18 18   Temp:  98.2 F (36.8 C) 99.6 F (37.6 C) 98.1 F (36.7 C)  TempSrc:  Oral Oral Oral  SpO2:  100% 95% 98%  Weight:    75.7 kg (166 lb 12.8 oz)  Height:       General appearance: alert, cooperative, appears stated age and no distress Resp: clear to auscultation bilaterally Cardio: regular rate and rhythm, S1, S2 normal, no murmur, click, rub or gallop GI: soft, non-tender; bowel sounds normal; no masses,  no organomegaly  DISPOSITION: Home  Discharge Instructions    Call MD for:  difficulty breathing, headache or visual disturbances   Complete by:  As directed    Call MD for:  extreme fatigue   Complete by:  As directed    Call  MD for:  persistant dizziness or light-headedness   Complete by:  As directed    Call MD for:  persistant nausea and vomiting   Complete by:  As directed    Call MD for:  severe uncontrolled pain   Complete by:  As directed    Call MD for:  temperature >100.4   Complete by:  As directed    Diet Carb Modified   Complete by:  As directed    Discharge instructions   Complete by:  As directed    Please be sure to follow-up with your PCP within 1 week.  Please get up slowly from a sitting and lying position as we discussed earlier today.  You were cared for by a  hospitalist during your hospital stay. If you have any questions about your discharge medications or the care you received while you were in the hospital after you are discharged, you can call the unit and asked to speak with the hospitalist on call if the hospitalist that took care of you is not available. Once you are discharged, your primary care physician will handle any further medical issues. Please note that NO REFILLS for any discharge medications will be authorized once you are discharged, as it is imperative that you return to your primary care physician (or establish a relationship with a primary care physician if you do not have one) for your aftercare needs so that they can reassess your need for medications and monitor your lab values. If you do not have a primary care physician, you can call [phone] for a physician referral.   Increase activity slowly   Complete by:  As directed         Allergies as of 10/10/2017      Reactions   Atenolol Other (See Comments)   Slows HR   Niacin Other (See Comments)   headaches      Medication List    STOP taking these medications   amLODipine 2.5 MG tablet Commonly known as:  NORVASC   Calcium Carb-Cholecalciferol 600-800 MG-UNIT Tabs Commonly known as:  CALCIUM + D3     TAKE these medications   acetaminophen 500 MG tablet Commonly known as:  TYLENOL He can take 2 tablets every 8 hours as needed for as long as he has discomfort.  You can buy this at any drug store over the counter.   allopurinol 300 MG tablet Commonly known as:  ZYLOPRIM Take 300 mg by mouth daily. What changed:  Another medication with the same name was removed. Continue taking this medication, and follow the directions you see here.   atorvastatin 20 MG tablet Commonly known as:  LIPITOR Take 20 mg by mouth every evening.   CALCIUM 1200+D3 PO Take 1 tablet by mouth at bedtime.   cetirizine 10 MG tablet Commonly known as:  ZYRTEC Take 10 mg by mouth  daily.   cholestyramine 4 g packet Commonly known as:  QUESTRAN Take 4 g by mouth as needed (for diarrhea after treatments).   CREON 12000 units Cpep capsule Generic drug:  lipase/protease/amylase Take 12,000 Units by mouth 3 (three) times daily with meals. What changed:  Another medication with the same name was removed. Continue taking this medication, and follow the directions you see here.   cyclobenzaprine 10 MG tablet Commonly known as:  FLEXERIL Take 5 mg by mouth daily as needed (for back pain).   fenofibrate 160 MG tablet Take 160 mg by mouth every evening.   gabapentin  300 MG capsule Commonly known as:  NEURONTIN Take 300 mg by mouth 3 (three) times daily.   latanoprost 0.005 % ophthalmic solution Commonly known as:  XALATAN Place 1 drop into both eyes at bedtime.   leuprolide 22.5 MG injection Commonly known as:  LUPRON Inject 22.5 mg into the muscle every 3 (three) months. What changed:  Another medication with the same name was removed. Continue taking this medication, and follow the directions you see here.   lidocaine-prilocaine cream Commonly known as:  EMLA Apply 1 application topically as needed (for port-a-cath before treatments- Rituxin; every 8 weeks). What changed:  Another medication with the same name was removed. Continue taking this medication, and follow the directions you see here.   magnesium oxide 400 MG tablet Commonly known as:  MAG-OX Take 400 mg by mouth 2 (two) times daily.   metFORMIN 500 MG tablet Commonly known as:  GLUCOPHAGE Take 500 mg by mouth 2 (two) times daily with a meal. What changed:  Another medication with the same name was removed. Continue taking this medication, and follow the directions you see here.   ONE-A-DAY MENS 50+ ADVANTAGE Tabs Take 1 tablet by mouth daily with breakfast.   Rivaroxaban 15 MG Tabs tablet Commonly known as:  XARELTO Take 15 mg by mouth at bedtime. What changed:  Another medication with the  same name was removed. Continue taking this medication, and follow the directions you see here.   sertraline 100 MG tablet Commonly known as:  ZOLOFT Take 100 mg by mouth daily.   TOUJEO MAX SOLOSTAR 300 UNIT/ML Sopn Generic drug:  Insulin Glargine Inject 10 Units into the skin daily. What changed:  Another medication with the same name was removed. Continue taking this medication, and follow the directions you see here.   traMADol 50 MG tablet Commonly known as:  ULTRAM Take 1 tablet (50 mg total) by mouth every 6 (six) hours as needed.   Vitamin B-12 2500 MCG Subl Place 2,500 mcg under the tongue daily. What changed:  Another medication with the same name was removed. Continue taking this medication, and follow the directions you see here.   vitamin C 500 MG tablet Commonly known as:  ASCORBIC ACID Take 500 mg by mouth daily. What changed:  Another medication with the same name was removed. Continue taking this medication, and follow the directions you see here.        Follow-up Information    [name], MD. Schedule an appointment as soon as possible for a visit in 1 week(s).   Specialty:  Internal Medicine Contact information: [address] [place] [address] [phone]           TOTAL DISCHARGE TIME: 70 mins  [facility] Pager [phone]  10/10/2017, 1:19 PM

## 2017-10-10 NOTE — Plan of Care (Signed)
  Problem: Activity: Goal: Risk for activity intolerance will decrease Outcome: Progressing   

## 2017-10-11 ENCOUNTER — Inpatient Hospital Stay: Payer: Medicare Other | Attending: Oncology

## 2017-10-11 ENCOUNTER — Inpatient Hospital Stay (HOSPITAL_BASED_OUTPATIENT_CLINIC_OR_DEPARTMENT_OTHER): Payer: Medicare Other | Admitting: Oncology

## 2017-10-11 VITALS — BP 115/73 | HR 97 | Temp 98.0°F | Resp 18 | Ht 68.0 in | Wt 167.8 lb

## 2017-10-11 DIAGNOSIS — C61 Malignant neoplasm of prostate: Secondary | ICD-10-CM | POA: Diagnosis not present

## 2017-10-11 DIAGNOSIS — N189 Chronic kidney disease, unspecified: Secondary | ICD-10-CM | POA: Diagnosis not present

## 2017-10-11 DIAGNOSIS — Z9181 History of falling: Secondary | ICD-10-CM | POA: Insufficient documentation

## 2017-10-11 DIAGNOSIS — C8333 Diffuse large B-cell lymphoma, intra-abdominal lymph nodes: Secondary | ICD-10-CM | POA: Diagnosis not present

## 2017-10-11 DIAGNOSIS — D7389 Other diseases of spleen: Secondary | ICD-10-CM | POA: Diagnosis not present

## 2017-10-11 DIAGNOSIS — E119 Type 2 diabetes mellitus without complications: Secondary | ICD-10-CM

## 2017-10-11 DIAGNOSIS — C8219 Follicular lymphoma grade II, extranodal and solid organ sites: Secondary | ICD-10-CM

## 2017-10-11 DIAGNOSIS — R55 Syncope and collapse: Secondary | ICD-10-CM | POA: Diagnosis not present

## 2017-10-11 DIAGNOSIS — Z794 Long term (current) use of insulin: Secondary | ICD-10-CM | POA: Diagnosis not present

## 2017-10-11 LAB — CBC WITH DIFFERENTIAL/PLATELET
BASOS PCT: 0 %
Basophils Absolute: 0 10*3/uL (ref 0.0–0.1)
EOS ABS: 0.1 10*3/uL (ref 0.0–0.5)
Eosinophils Relative: 1 %
HEMATOCRIT: 30.6 % — AB (ref 38.4–49.9)
HEMOGLOBIN: 10 g/dL — AB (ref 13.0–17.1)
Lymphocytes Relative: 3 %
Lymphs Abs: 0.3 10*3/uL — ABNORMAL LOW (ref 0.9–3.3)
MCH: 27.9 pg (ref 27.2–33.4)
MCHC: 32.7 g/dL (ref 32.0–36.0)
MCV: 85.5 fL (ref 79.3–98.0)
Monocytes Absolute: 0.6 10*3/uL (ref 0.1–0.9)
Monocytes Relative: 7 %
NEUTROS ABS: 7.9 10*3/uL — AB (ref 1.5–6.5)
NEUTROS PCT: 89 %
Platelets: 134 10*3/uL — ABNORMAL LOW (ref 140–400)
RBC: 3.58 MIL/uL — ABNORMAL LOW (ref 4.20–5.82)
RDW: 20.7 % — AB (ref 11.0–14.6)
WBC: 8.9 10*3/uL (ref 4.0–10.3)

## 2017-10-11 LAB — COMPREHENSIVE METABOLIC PANEL
ALK PHOS: 119 U/L (ref 40–150)
ALT: 23 U/L (ref 0–55)
ANION GAP: 9 (ref 3–11)
AST: 31 U/L (ref 5–34)
Albumin: 4 g/dL (ref 3.5–5.0)
BUN: 24 mg/dL (ref 7–26)
CALCIUM: 10.2 mg/dL (ref 8.4–10.4)
CO2: 25 mmol/L (ref 22–29)
Chloride: 104 mmol/L (ref 98–109)
Creatinine, Ser: 1.54 mg/dL — ABNORMAL HIGH (ref 0.70–1.30)
GFR, EST AFRICAN AMERICAN: 46 mL/min — AB (ref >60)
GFR, EST NON AFRICAN AMERICAN: 40 mL/min — AB (ref >60)
Glucose, Bld: 169 mg/dL — ABNORMAL HIGH (ref 70–140)
Potassium: 3.7 mmol/L (ref 3.5–5.1)
Sodium: 138 mmol/L (ref 136–145)
Total Bilirubin: 0.5 mg/dL (ref 0.2–1.2)
Total Protein: 6.9 g/dL (ref 6.4–8.3)

## 2017-10-11 LAB — LACTATE DEHYDROGENASE: LDH: 207 U/L (ref 125–245)

## 2017-10-12 ENCOUNTER — Telehealth: Payer: Self-pay | Admitting: Oncology

## 2017-10-12 NOTE — Telephone Encounter (Signed)
Per 6/19 no los

## 2017-10-16 DIAGNOSIS — I4891 Unspecified atrial fibrillation: Secondary | ICD-10-CM | POA: Diagnosis not present

## 2017-10-16 DIAGNOSIS — C8590 Non-Hodgkin lymphoma, unspecified, unspecified site: Secondary | ICD-10-CM | POA: Diagnosis not present

## 2017-10-16 DIAGNOSIS — C61 Malignant neoplasm of prostate: Secondary | ICD-10-CM | POA: Diagnosis not present

## 2017-10-16 DIAGNOSIS — I10 Essential (primary) hypertension: Secondary | ICD-10-CM | POA: Diagnosis not present

## 2017-10-16 DIAGNOSIS — D649 Anemia, unspecified: Secondary | ICD-10-CM | POA: Diagnosis not present

## 2017-10-16 DIAGNOSIS — N183 Chronic kidney disease, stage 3 (moderate): Secondary | ICD-10-CM | POA: Diagnosis not present

## 2017-10-27 ENCOUNTER — Encounter: Payer: Self-pay | Admitting: Cardiovascular Disease

## 2017-10-27 DIAGNOSIS — E119 Type 2 diabetes mellitus without complications: Secondary | ICD-10-CM | POA: Diagnosis not present

## 2017-10-27 DIAGNOSIS — I10 Essential (primary) hypertension: Secondary | ICD-10-CM | POA: Diagnosis not present

## 2017-10-27 DIAGNOSIS — R319 Hematuria, unspecified: Secondary | ICD-10-CM | POA: Diagnosis not present

## 2017-10-27 DIAGNOSIS — N390 Urinary tract infection, site not specified: Secondary | ICD-10-CM | POA: Diagnosis not present

## 2017-11-01 ENCOUNTER — Inpatient Hospital Stay: Payer: Medicare Other | Attending: Oncology

## 2017-11-01 ENCOUNTER — Inpatient Hospital Stay: Payer: Medicare Other

## 2017-11-01 VITALS — BP 117/51 | HR 69 | Temp 97.7°F | Resp 16

## 2017-11-01 DIAGNOSIS — C61 Malignant neoplasm of prostate: Secondary | ICD-10-CM

## 2017-11-01 DIAGNOSIS — Z79899 Other long term (current) drug therapy: Secondary | ICD-10-CM | POA: Insufficient documentation

## 2017-11-01 DIAGNOSIS — C8333 Diffuse large B-cell lymphoma, intra-abdominal lymph nodes: Secondary | ICD-10-CM | POA: Diagnosis not present

## 2017-11-01 DIAGNOSIS — E782 Mixed hyperlipidemia: Secondary | ICD-10-CM | POA: Diagnosis not present

## 2017-11-01 DIAGNOSIS — C8203 Follicular lymphoma grade I, intra-abdominal lymph nodes: Secondary | ICD-10-CM

## 2017-11-01 DIAGNOSIS — C8219 Follicular lymphoma grade II, extranodal and solid organ sites: Secondary | ICD-10-CM

## 2017-11-01 DIAGNOSIS — D649 Anemia, unspecified: Secondary | ICD-10-CM | POA: Diagnosis not present

## 2017-11-01 DIAGNOSIS — E1121 Type 2 diabetes mellitus with diabetic nephropathy: Secondary | ICD-10-CM | POA: Diagnosis not present

## 2017-11-01 DIAGNOSIS — Z5112 Encounter for antineoplastic immunotherapy: Secondary | ICD-10-CM | POA: Diagnosis not present

## 2017-11-01 DIAGNOSIS — C8590 Non-Hodgkin lymphoma, unspecified, unspecified site: Secondary | ICD-10-CM | POA: Diagnosis not present

## 2017-11-01 DIAGNOSIS — Z23 Encounter for immunization: Secondary | ICD-10-CM | POA: Diagnosis not present

## 2017-11-01 DIAGNOSIS — I10 Essential (primary) hypertension: Secondary | ICD-10-CM | POA: Diagnosis not present

## 2017-11-01 DIAGNOSIS — I259 Chronic ischemic heart disease, unspecified: Secondary | ICD-10-CM | POA: Diagnosis not present

## 2017-11-01 DIAGNOSIS — Z0000 Encounter for general adult medical examination without abnormal findings: Secondary | ICD-10-CM | POA: Diagnosis not present

## 2017-11-01 DIAGNOSIS — C8580 Other specified types of non-Hodgkin lymphoma, unspecified site: Secondary | ICD-10-CM

## 2017-11-01 DIAGNOSIS — I4891 Unspecified atrial fibrillation: Secondary | ICD-10-CM | POA: Diagnosis not present

## 2017-11-01 DIAGNOSIS — F329 Major depressive disorder, single episode, unspecified: Secondary | ICD-10-CM | POA: Diagnosis not present

## 2017-11-01 DIAGNOSIS — Z6828 Body mass index (BMI) 28.0-28.9, adult: Secondary | ICD-10-CM | POA: Diagnosis not present

## 2017-11-01 LAB — COMPREHENSIVE METABOLIC PANEL
ALBUMIN: 4.3 g/dL (ref 3.5–5.0)
ALT: 30 U/L (ref 0–44)
ANION GAP: 10 (ref 5–15)
AST: 35 U/L (ref 15–41)
Alkaline Phosphatase: 88 U/L (ref 38–126)
BILIRUBIN TOTAL: 0.4 mg/dL (ref 0.3–1.2)
BUN: 22 mg/dL (ref 8–23)
CHLORIDE: 104 mmol/L (ref 98–111)
CO2: 25 mmol/L (ref 22–32)
Calcium: 10.1 mg/dL (ref 8.9–10.3)
Creatinine, Ser: 1.46 mg/dL — ABNORMAL HIGH (ref 0.61–1.24)
GFR calc Af Amer: 49 mL/min — ABNORMAL LOW (ref >60)
GFR, EST NON AFRICAN AMERICAN: 43 mL/min — AB (ref >60)
GLUCOSE: 258 mg/dL — AB (ref 70–99)
POTASSIUM: 4.4 mmol/L (ref 3.5–5.1)
Sodium: 139 mmol/L (ref 135–145)
TOTAL PROTEIN: 6.9 g/dL (ref 6.5–8.1)

## 2017-11-01 LAB — CBC WITH DIFFERENTIAL/PLATELET
BASOS ABS: 0 10*3/uL (ref 0.0–0.1)
BASOS PCT: 0 %
Eosinophils Absolute: 0.2 10*3/uL (ref 0.0–0.5)
Eosinophils Relative: 5 %
HCT: 33.7 % — ABNORMAL LOW (ref 38.4–49.9)
HEMOGLOBIN: 11 g/dL — AB (ref 13.0–17.1)
LYMPHS PCT: 7 %
Lymphs Abs: 0.3 10*3/uL — ABNORMAL LOW (ref 0.9–3.3)
MCH: 28.8 pg (ref 27.2–33.4)
MCHC: 32.6 g/dL (ref 32.0–36.0)
MCV: 88.2 fL (ref 79.3–98.0)
MONO ABS: 0.2 10*3/uL (ref 0.1–0.9)
Monocytes Relative: 6 %
NEUTROS ABS: 3.2 10*3/uL (ref 1.5–6.5)
NEUTROS PCT: 82 %
PLATELETS: 132 10*3/uL — AB (ref 140–400)
RBC: 3.82 MIL/uL — ABNORMAL LOW (ref 4.20–5.82)
RDW: 17 % — AB (ref 11.0–14.6)
WBC: 3.8 10*3/uL — ABNORMAL LOW (ref 4.0–10.3)

## 2017-11-01 LAB — LACTATE DEHYDROGENASE: LDH: 159 U/L (ref 98–192)

## 2017-11-01 MED ORDER — DIPHENHYDRAMINE HCL 25 MG PO CAPS
25.0000 mg | ORAL_CAPSULE | Freq: Once | ORAL | Status: AC
  Administered 2017-11-01: 25 mg via ORAL

## 2017-11-01 MED ORDER — SODIUM CHLORIDE 0.9 % IV SOLN
Freq: Once | INTRAVENOUS | Status: AC
  Administered 2017-11-01: 12:00:00 via INTRAVENOUS

## 2017-11-01 MED ORDER — DIPHENHYDRAMINE HCL 25 MG PO CAPS
ORAL_CAPSULE | ORAL | Status: AC
  Filled 2017-11-01: qty 1

## 2017-11-01 MED ORDER — ACETAMINOPHEN 325 MG PO TABS
ORAL_TABLET | ORAL | Status: AC
  Filled 2017-11-01: qty 2

## 2017-11-01 MED ORDER — ACETAMINOPHEN 325 MG PO TABS
650.0000 mg | ORAL_TABLET | Freq: Once | ORAL | Status: AC
  Administered 2017-11-01: 650 mg via ORAL

## 2017-11-01 MED ORDER — RITUXIMAB CHEMO INJECTION 500 MG/50ML
350.0000 mg/m2 | Freq: Once | INTRAVENOUS | Status: AC
  Administered 2017-11-01: 700 mg via INTRAVENOUS
  Filled 2017-11-01: qty 50

## 2017-11-01 MED ORDER — HEPARIN SOD (PORK) LOCK FLUSH 100 UNIT/ML IV SOLN
500.0000 [IU] | Freq: Once | INTRAVENOUS | Status: AC | PRN
  Administered 2017-11-01: 500 [IU]
  Filled 2017-11-01: qty 5

## 2017-11-01 MED ORDER — SODIUM CHLORIDE 0.9 % IJ SOLN
10.0000 mL | INTRAMUSCULAR | Status: DC | PRN
  Administered 2017-11-01: 10 mL
  Filled 2017-11-01: qty 10

## 2017-11-01 NOTE — Patient Instructions (Signed)
[facility] Discharge Instructions for Patients Receiving Chemotherapy  Today you received the following chemotherapy agents:  Rituxan   To help prevent nausea and vomiting after your treatment, we encourage you to take your nausea medication as prescribed.   If you develop nausea and vomiting that is not controlled by your nausea medication, call the clinic.   BELOW ARE SYMPTOMS THAT SHOULD BE REPORTED IMMEDIATELY:  *FEVER GREATER THAN 100.5 F  *CHILLS WITH OR WITHOUT FEVER  NAUSEA AND VOMITING THAT IS NOT CONTROLLED WITH YOUR NAUSEA MEDICATION  *UNUSUAL [name]  *UNUSUAL BRUISING OR BLEEDING  TENDERNESS IN MOUTH AND THROAT WITH OR WITHOUT PRESENCE OF ULCERS  *URINARY PROBLEMS  *BOWEL PROBLEMS  UNUSUAL RASH Items with * indicate a potential emergency and should be followed up as soon as possible.  Feel free to call the clinic should you have any questions or concerns. The clinic phone number is [phone].  Please show the CHEMO ALERT CARD at check-in to the Emergency Department and triage nurse.   

## 2017-11-02 ENCOUNTER — Inpatient Hospital Stay: Payer: Medicare Other

## 2017-11-15 ENCOUNTER — Encounter: Payer: Self-pay | Admitting: Cardiology

## 2017-11-15 ENCOUNTER — Ambulatory Visit (INDEPENDENT_AMBULATORY_CARE_PROVIDER_SITE_OTHER): Payer: Medicare Other | Admitting: Cardiology

## 2017-11-15 VITALS — BP 116/50 | HR 70 | Wt 166.8 lb

## 2017-11-15 DIAGNOSIS — N183 Chronic kidney disease, stage 3 unspecified: Secondary | ICD-10-CM

## 2017-11-15 DIAGNOSIS — I25810 Atherosclerosis of coronary artery bypass graft(s) without angina pectoris: Secondary | ICD-10-CM

## 2017-11-15 DIAGNOSIS — I480 Paroxysmal atrial fibrillation: Secondary | ICD-10-CM

## 2017-11-15 DIAGNOSIS — E0822 Diabetes mellitus due to underlying condition with diabetic chronic kidney disease: Secondary | ICD-10-CM | POA: Diagnosis not present

## 2017-11-15 DIAGNOSIS — I951 Orthostatic hypotension: Secondary | ICD-10-CM | POA: Diagnosis not present

## 2017-11-15 NOTE — Assessment & Plan Note (Signed)
50% LAD, 80% PL on remote cath. No recent angina. Normal LVF by echo June 2019

## 2017-11-15 NOTE — Assessment & Plan Note (Signed)
Holding NSR- on Xarelto

## 2017-11-15 NOTE — Progress Notes (Signed)
Thanks, [employer]

## 2017-11-15 NOTE — Assessment & Plan Note (Signed)
Norvasc has been discontinued. Conservative measures for now- hydration, education, support stockings.
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
## 2017-11-15 NOTE — Patient Instructions (Signed)
Kerin Ransom, PA-C recommends that you schedule a follow-up appointment in 3 months with Dr [name].  If you need a refill on your cardiac medications before your next appointment, please call your pharmacy.

## 2017-11-15 NOTE — Assessment & Plan Note (Signed)
CRI-3

## 2017-12-05 ENCOUNTER — Other Ambulatory Visit: Payer: Self-pay | Admitting: *Deleted

## 2017-12-05 MED ORDER — CHOLESTYRAMINE 4 G PO PACK: 4.0000 g | PACK | ORAL | 1 refills | Status: DC | PRN

## 2017-12-07 ENCOUNTER — Telehealth: Payer: Self-pay | Admitting: *Deleted

## 2017-12-07 ENCOUNTER — Other Ambulatory Visit: Payer: Self-pay | Admitting: *Deleted

## 2017-12-07 NOTE — Telephone Encounter (Signed)
This RN spoke with the pt's daughter - [name] her call inquiring about MRI scheduled for February 12 2018. Per last visit her sister was with their father and understood the scan was to be canceled. [name] is using my chart to schedule and arrange care for her father's appointments and is needing to verify about this test.  Note per MD's last dictation 10/11/2017 :  " Given his recent scans I do not think we need to repeat a liver MRI in October and we will cancel that".  This RN informed [name] of above as well as canceled appointment and d/ced order for MRI of the liver.

## 2017-12-08 ENCOUNTER — Ambulatory Visit (INDEPENDENT_AMBULATORY_CARE_PROVIDER_SITE_OTHER): Payer: Medicare Other | Admitting: Pulmonary Disease

## 2017-12-08 ENCOUNTER — Encounter: Payer: Self-pay | Admitting: Pulmonary Disease

## 2017-12-08 ENCOUNTER — Other Ambulatory Visit: Payer: Self-pay | Admitting: Oncology

## 2017-12-08 VITALS — BP 118/68 | HR 63 | Ht 68.0 in | Wt 164.2 lb

## 2017-12-08 DIAGNOSIS — I25810 Atherosclerosis of coronary artery bypass graft(s) without angina pectoris: Secondary | ICD-10-CM | POA: Diagnosis not present

## 2017-12-08 DIAGNOSIS — G4733 Obstructive sleep apnea (adult) (pediatric): Secondary | ICD-10-CM | POA: Diagnosis not present

## 2017-12-08 DIAGNOSIS — R310 Gross hematuria: Secondary | ICD-10-CM | POA: Diagnosis not present

## 2017-12-08 DIAGNOSIS — C61 Malignant neoplasm of prostate: Secondary | ICD-10-CM | POA: Diagnosis not present

## 2017-12-08 DIAGNOSIS — N131 Hydronephrosis with ureteral stricture, not elsewhere classified: Secondary | ICD-10-CM | POA: Diagnosis not present

## 2017-12-08 NOTE — Patient Instructions (Signed)
Will arrange for home sleep study Will call to arrange for follow up after sleep study reviewed  
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
## 2017-12-08 NOTE — Progress Notes (Signed)
   Subjective:    Patient ID: [name], male    DOB: 1931/07/02, 84 y.o.   MRN: [id number]  HPI    Review of Systems  Constitutional: Positive for appetite change and fatigue.  HENT: Negative.   Eyes: Negative.   Respiratory: Positive for cough and shortness of breath.   Cardiovascular: Negative.   Gastrointestinal: Negative.   Endocrine: Negative.   Genitourinary: Negative.   Musculoskeletal: Negative.   Skin: Negative.   Allergic/Immunologic: Negative.   Neurological: Positive for headaches.  Hematological: Negative.   Psychiatric/Behavioral: Positive for sleep disturbance.       Objective:   Physical Exam        Assessment & Plan:

## 2017-12-15 DIAGNOSIS — E782 Mixed hyperlipidemia: Secondary | ICD-10-CM | POA: Diagnosis not present

## 2017-12-15 DIAGNOSIS — I10 Essential (primary) hypertension: Secondary | ICD-10-CM | POA: Diagnosis not present

## 2017-12-15 DIAGNOSIS — E119 Type 2 diabetes mellitus without complications: Secondary | ICD-10-CM | POA: Diagnosis not present

## 2017-12-19 ENCOUNTER — Other Ambulatory Visit: Payer: Self-pay | Admitting: Urology

## 2017-12-28 ENCOUNTER — Inpatient Hospital Stay: Payer: Medicare Other | Attending: Oncology

## 2017-12-28 ENCOUNTER — Inpatient Hospital Stay: Payer: Medicare Other

## 2017-12-28 VITALS — BP 142/73 | HR 57 | Temp 98.2°F | Resp 17

## 2017-12-28 DIAGNOSIS — Z79899 Other long term (current) drug therapy: Secondary | ICD-10-CM | POA: Diagnosis not present

## 2017-12-28 DIAGNOSIS — C8203 Follicular lymphoma grade I, intra-abdominal lymph nodes: Secondary | ICD-10-CM

## 2017-12-28 DIAGNOSIS — C61 Malignant neoplasm of prostate: Secondary | ICD-10-CM

## 2017-12-28 DIAGNOSIS — C8580 Other specified types of non-Hodgkin lymphoma, unspecified site: Secondary | ICD-10-CM

## 2017-12-28 DIAGNOSIS — Z5112 Encounter for antineoplastic immunotherapy: Secondary | ICD-10-CM | POA: Insufficient documentation

## 2017-12-28 DIAGNOSIS — Z95828 Presence of other vascular implants and grafts: Secondary | ICD-10-CM

## 2017-12-28 DIAGNOSIS — C8333 Diffuse large B-cell lymphoma, intra-abdominal lymph nodes: Secondary | ICD-10-CM | POA: Diagnosis not present

## 2017-12-28 DIAGNOSIS — C8590 Non-Hodgkin lymphoma, unspecified, unspecified site: Secondary | ICD-10-CM

## 2017-12-28 DIAGNOSIS — C8219 Follicular lymphoma grade II, extranodal and solid organ sites: Secondary | ICD-10-CM

## 2017-12-28 LAB — COMPREHENSIVE METABOLIC PANEL
ALBUMIN: 3.9 g/dL (ref 3.5–5.0)
ALT: 21 U/L (ref 0–44)
AST: 22 U/L (ref 15–41)
Alkaline Phosphatase: 73 U/L (ref 38–126)
Anion gap: 10 (ref 5–15)
BILIRUBIN TOTAL: 0.4 mg/dL (ref 0.3–1.2)
BUN: 22 mg/dL (ref 8–23)
CHLORIDE: 105 mmol/L (ref 98–111)
CO2: 25 mmol/L (ref 22–32)
CREATININE: 1.28 mg/dL — AB (ref 0.61–1.24)
Calcium: 9.8 mg/dL (ref 8.9–10.3)
GFR calc Af Amer: 58 mL/min — ABNORMAL LOW (ref >60)
GFR, EST NON AFRICAN AMERICAN: 50 mL/min — AB (ref >60)
GLUCOSE: 209 mg/dL — AB (ref 70–99)
Potassium: 4.1 mmol/L (ref 3.5–5.1)
Sodium: 140 mmol/L (ref 135–145)
Total Protein: 6.6 g/dL (ref 6.5–8.1)

## 2017-12-28 LAB — CBC WITH DIFFERENTIAL/PLATELET
BASOS ABS: 0 10*3/uL (ref 0.0–0.1)
Basophils Relative: 0 %
Eosinophils Absolute: 0.3 10*3/uL (ref 0.0–0.5)
Eosinophils Relative: 7 %
HEMATOCRIT: 33.4 % — AB (ref 38.4–49.9)
Hemoglobin: 10.7 g/dL — ABNORMAL LOW (ref 13.0–17.1)
LYMPHS PCT: 7 %
Lymphs Abs: 0.3 10*3/uL — ABNORMAL LOW (ref 0.9–3.3)
MCH: 27.9 pg (ref 27.2–33.4)
MCHC: 32 g/dL (ref 32.0–36.0)
MCV: 87.2 fL (ref 79.3–98.0)
Monocytes Absolute: 0.3 10*3/uL (ref 0.1–0.9)
Monocytes Relative: 6 %
NEUTROS ABS: 4 10*3/uL (ref 1.5–6.5)
Neutrophils Relative %: 80 %
PLATELETS: 151 10*3/uL (ref 140–400)
RBC: 3.83 MIL/uL — AB (ref 4.20–5.82)
RDW: 14.5 % (ref 11.0–14.6)
WBC: 4.9 10*3/uL (ref 4.0–10.3)

## 2017-12-28 LAB — LACTATE DEHYDROGENASE: LDH: 118 U/L (ref 98–192)

## 2017-12-28 MED ORDER — DIPHENHYDRAMINE HCL 25 MG PO CAPS
25.0000 mg | ORAL_CAPSULE | Freq: Once | ORAL | Status: AC
  Administered 2017-12-28: 25 mg via ORAL

## 2017-12-28 MED ORDER — SODIUM CHLORIDE 0.9 % IJ SOLN
10.0000 mL | INTRAMUSCULAR | Status: DC | PRN
  Administered 2017-12-28: 10 mL via INTRAVENOUS
  Filled 2017-12-28: qty 10

## 2017-12-28 MED ORDER — HEPARIN SOD (PORK) LOCK FLUSH 100 UNIT/ML IV SOLN
500.0000 [IU] | Freq: Once | INTRAVENOUS | Status: AC | PRN
  Administered 2017-12-28: 500 [IU]
  Filled 2017-12-28: qty 5

## 2017-12-28 MED ORDER — SODIUM CHLORIDE 0.9 % IV SOLN
Freq: Once | INTRAVENOUS | Status: AC
  Administered 2017-12-28: 10:00:00 via INTRAVENOUS
  Filled 2017-12-28: qty 250

## 2017-12-28 MED ORDER — DIPHENHYDRAMINE HCL 25 MG PO CAPS
ORAL_CAPSULE | ORAL | Status: AC
  Filled 2017-12-28: qty 1

## 2017-12-28 MED ORDER — ACETAMINOPHEN 325 MG PO TABS
ORAL_TABLET | ORAL | Status: AC
  Filled 2017-12-28: qty 2

## 2017-12-28 MED ORDER — SODIUM CHLORIDE 0.9 % IV SOLN
350.0000 mg/m2 | Freq: Once | INTRAVENOUS | Status: AC
  Administered 2017-12-28: 700 mg via INTRAVENOUS
  Filled 2017-12-28: qty 50

## 2017-12-28 MED ORDER — ACETAMINOPHEN 325 MG PO TABS
650.0000 mg | ORAL_TABLET | Freq: Once | ORAL | Status: AC
  Administered 2017-12-28: 650 mg via ORAL

## 2017-12-28 MED ORDER — SODIUM CHLORIDE 0.9 % IJ SOLN
10.0000 mL | INTRAMUSCULAR | Status: DC | PRN
  Administered 2017-12-28: 10 mL
  Filled 2017-12-28: qty 10

## 2017-12-28 NOTE — Progress Notes (Signed)
lov pulm Dr Halford Chessman 12-08-17 epic   lov cardiology , Kerin Ransom PA-C 11-15-17 epic   EKG 11-15-17 epic   ECHO 10-10-17 Epic   CXR 10-08-17 Epic

## 2017-12-28 NOTE — Patient Instructions (Signed)
[facility] Discharge Instructions for Patients Receiving Chemotherapy  Today you received the following chemotherapy agents:  Rituxan   To help prevent nausea and vomiting after your treatment, we encourage you to take your nausea medication as prescribed.   If you develop nausea and vomiting that is not controlled by your nausea medication, call the clinic.   BELOW ARE SYMPTOMS THAT SHOULD BE REPORTED IMMEDIATELY:  *FEVER GREATER THAN 100.5 F  *CHILLS WITH OR WITHOUT FEVER  NAUSEA AND VOMITING THAT IS NOT CONTROLLED WITH YOUR NAUSEA MEDICATION  *UNUSUAL [name]  *UNUSUAL BRUISING OR BLEEDING  TENDERNESS IN MOUTH AND THROAT WITH OR WITHOUT PRESENCE OF ULCERS  *URINARY PROBLEMS  *BOWEL PROBLEMS  UNUSUAL RASH Items with * indicate a potential emergency and should be followed up as soon as possible.  Feel free to call the clinic should you have any questions or concerns. The clinic phone number is [phone].  Please show the CHEMO ALERT CARD at check-in to the Emergency Department and triage nurse.   

## 2017-12-28 NOTE — Patient Instructions (Addendum)
[name]  12/28/2017   Your procedure is scheduled on: 01-04-18   Report to [facility] Main  Entrance    Report to admitting at 9:00AM    Call this number if you have problems the morning of surgery [phone]     Remember: Do not eat food or drink liquids :After Midnight.     Take these medicines the morning of surgery with A SIP OF WATER: ALLOPURINOL, ZYRTEC, SERTRALINE , GABAPENTIN    How to Manage Your Diabetes Before and After Surgery  Why is it important to control my blood sugar before and after surgery? . Improving blood sugar levels before and after surgery helps healing and can limit problems. . A way of improving blood sugar control is eating a healthy diet by: o  Eating less sugar and carbohydrates o  Increasing activity/exercise o  Talking with your doctor about reaching your blood sugar goals . High blood sugars (greater than 180 mg/dL) can raise your risk of infections and slow your recovery, so you will need to focus on controlling your diabetes during the weeks before surgery. . Make sure that the doctor who takes care of your diabetes knows about your planned surgery including the date and location.    WHAT DO I DO ABOUT MY DIABETES MEDICATION?   . THE NIGHT BEFORE SURGERY o TAKE HALF DOSE OF TOUJEO INSULIN  o TAKE METFORMIN NORMALLY      . THE MORNING OF SURGERY o TAKE HALF DOSE OF TOUJEO INSULIN  o DO NOT TAKE METFORMIN   Patient Signature:  Date:   Nurse Signature:  Date:   Reviewed and Endorsed by [facility] Patient Education Committee, August 2015                               You may not have any metal on your body including hair pins and              piercings  Do not wear jewelry, make-up, lotions, powders or perfumes, deodorant                  Men may shave face and neck.   Do not bring valuables to the hospital. [place].  Contacts, dentures or  bridgework may not be worn into surgery.       Patients discharged the day of surgery will not be allowed to drive home.  Name and phone number of your driver:  Special Instructions: N/A              Please read over the following fact sheets you were given: _____________________________________________________________________             [facility] - Preparing for Surgery Before surgery, you can play an important role.  Because skin is not sterile, your skin needs to be as free of germs as possible.  You can reduce the number of germs on your skin by washing with CHG (chlorahexidine gluconate) soap before surgery.  CHG is an antiseptic cleaner which kills germs and bonds with the skin to continue killing germs even after washing. Please DO NOT use if you have an allergy to CHG or antibacterial soaps.  If your skin becomes reddened/irritated stop using  the CHG and inform your nurse when you arrive at Short Stay. Do not shave (including legs and underarms) for at least 48 hours prior to the first CHG shower.  You may shave your face/neck. Please follow these instructions carefully:  1.  Shower with CHG Soap the night before surgery and the  morning of Surgery.  2.  If you choose to wash your hair, wash your hair first as usual with your  normal  shampoo.  3.  After you shampoo, rinse your hair and body thoroughly to remove the  shampoo.                           4.  Use CHG as you would any other liquid soap.  You can apply chg directly  to the skin and wash                       Gently with a scrungie or clean washcloth.  5.  Apply the CHG Soap to your body ONLY FROM THE NECK DOWN.   Do not use on face/ open                           Wound or open sores. Avoid contact with eyes, ears mouth and genitals (private parts).                       Wash face,  Genitals (private parts) with your normal soap.             6.  Wash thoroughly, paying special attention to the area where your surgery   will be performed.  7.  Thoroughly rinse your body with warm water from the neck down.  8.  DO NOT shower/wash with your normal soap after using and rinsing off  the CHG Soap.                9.  Pat yourself dry with a clean towel.            10.  Wear clean pajamas.            11.  Place clean sheets on your bed the night of your first shower and do not  sleep with pets. Day of Surgery : Do not apply any lotions/deodorants the morning of surgery.  Please wear clean clothes to the hospital/surgery center.  FAILURE TO FOLLOW THESE INSTRUCTIONS MAY RESULT IN THE CANCELLATION OF YOUR SURGERY PATIENT SIGNATURE_________________________________  NURSE SIGNATURE__________________________________  ________________________________________________________________________

## 2017-12-29 ENCOUNTER — Other Ambulatory Visit: Payer: Self-pay

## 2017-12-29 ENCOUNTER — Encounter (HOSPITAL_COMMUNITY): Payer: Self-pay

## 2017-12-29 ENCOUNTER — Encounter (HOSPITAL_COMMUNITY)
Admission: RE | Admit: 2017-12-29 | Discharge: 2017-12-29 | Disposition: A | Discharge status: Home / Self Care | Payer: Medicare Other | Source: Ambulatory Visit | Attending: Urology | Admitting: Urology

## 2017-12-29 DIAGNOSIS — C61 Malignant neoplasm of prostate: Secondary | ICD-10-CM | POA: Insufficient documentation

## 2017-12-29 DIAGNOSIS — N135 Crossing vessel and stricture of ureter without hydronephrosis: Secondary | ICD-10-CM | POA: Diagnosis not present

## 2017-12-29 DIAGNOSIS — Z01812 Encounter for preprocedural laboratory examination: Secondary | ICD-10-CM | POA: Insufficient documentation

## 2017-12-29 LAB — HEMOGLOBIN A1C
HEMOGLOBIN A1C: 6.9 % — AB (ref 4.8–5.6)
Mean Plasma Glucose: 151.33 mg/dL

## 2017-12-29 LAB — BASIC METABOLIC PANEL
ANION GAP: 10 (ref 5–15)
BUN: 25 mg/dL — AB (ref 8–23)
CO2: 27 mmol/L (ref 22–32)
Calcium: 9.9 mg/dL (ref 8.9–10.3)
Chloride: 105 mmol/L (ref 98–111)
Creatinine, Ser: 1.25 mg/dL — ABNORMAL HIGH (ref 0.61–1.24)
GFR, EST AFRICAN AMERICAN: 60 mL/min — AB (ref >60)
GFR, EST NON AFRICAN AMERICAN: 51 mL/min — AB (ref >60)
Glucose, Bld: 171 mg/dL — ABNORMAL HIGH (ref 70–99)
Potassium: 4.3 mmol/L (ref 3.5–5.1)
SODIUM: 142 mmol/L (ref 135–145)

## 2017-12-29 LAB — CBC
HCT: 35.2 % — ABNORMAL LOW (ref 39.0–52.0)
Hemoglobin: 11.5 g/dL — ABNORMAL LOW (ref 13.0–17.0)
MCH: 28 pg (ref 26.0–34.0)
MCHC: 32.7 g/dL (ref 30.0–36.0)
MCV: 85.6 fL (ref 78.0–100.0)
PLATELETS: 167 10*3/uL (ref 150–400)
RBC: 4.11 MIL/uL — AB (ref 4.22–5.81)
RDW: 14.4 % (ref 11.5–15.5)
WBC: 4.6 10*3/uL (ref 4.0–10.5)

## 2017-12-29 LAB — GLUCOSE, CAPILLARY: GLUCOSE-CAPILLARY: 157 mg/dL — AB (ref 70–99)

## 2017-12-29 NOTE — Progress Notes (Addendum)
Xarelto instructions on chart from [facility], received via fax from patient's daughter

## 2018-01-02 DIAGNOSIS — Z23 Encounter for immunization: Secondary | ICD-10-CM | POA: Diagnosis not present
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
## 2018-01-04 ENCOUNTER — Encounter (HOSPITAL_COMMUNITY)
Admission: RE | Disposition: A | Discharge status: Home / Self Care | Payer: Self-pay | Source: Ambulatory Visit | Attending: Urology

## 2018-01-04 ENCOUNTER — Ambulatory Visit (HOSPITAL_COMMUNITY)
Admission: RE | Admit: 2018-01-04 | Discharge: 2018-01-04 | Disposition: A | Discharge status: Home / Self Care | Payer: Medicare Other | Source: Ambulatory Visit | Attending: Urology | Admitting: Urology

## 2018-01-04 ENCOUNTER — Ambulatory Visit (HOSPITAL_COMMUNITY): Payer: Medicare Other | Admitting: Certified Registered Nurse Anesthetist

## 2018-01-04 ENCOUNTER — Ambulatory Visit (HOSPITAL_COMMUNITY): Payer: Medicare Other

## 2018-01-04 ENCOUNTER — Encounter (HOSPITAL_COMMUNITY): Payer: Self-pay | Admitting: General Practice

## 2018-01-04 DIAGNOSIS — Z794 Long term (current) use of insulin: Secondary | ICD-10-CM | POA: Diagnosis not present

## 2018-01-04 DIAGNOSIS — I4891 Unspecified atrial fibrillation: Secondary | ICD-10-CM | POA: Insufficient documentation

## 2018-01-04 DIAGNOSIS — C8510 Unspecified B-cell lymphoma, unspecified site: Secondary | ICD-10-CM | POA: Insufficient documentation

## 2018-01-04 DIAGNOSIS — K219 Gastro-esophageal reflux disease without esophagitis: Secondary | ICD-10-CM | POA: Diagnosis not present

## 2018-01-04 DIAGNOSIS — I10 Essential (primary) hypertension: Secondary | ICD-10-CM | POA: Insufficient documentation

## 2018-01-04 DIAGNOSIS — E119 Type 2 diabetes mellitus without complications: Secondary | ICD-10-CM | POA: Diagnosis not present

## 2018-01-04 DIAGNOSIS — N135 Crossing vessel and stricture of ureter without hydronephrosis: Secondary | ICD-10-CM | POA: Insufficient documentation

## 2018-01-04 DIAGNOSIS — G4730 Sleep apnea, unspecified: Secondary | ICD-10-CM | POA: Insufficient documentation

## 2018-01-04 DIAGNOSIS — Z79899 Other long term (current) drug therapy: Secondary | ICD-10-CM | POA: Insufficient documentation

## 2018-01-04 DIAGNOSIS — N131 Hydronephrosis with ureteral stricture, not elsewhere classified: Secondary | ICD-10-CM | POA: Diagnosis not present

## 2018-01-04 DIAGNOSIS — Z87891 Personal history of nicotine dependence: Secondary | ICD-10-CM | POA: Insufficient documentation

## 2018-01-04 DIAGNOSIS — Z7901 Long term (current) use of anticoagulants: Secondary | ICD-10-CM | POA: Insufficient documentation

## 2018-01-04 DIAGNOSIS — E7800 Pure hypercholesterolemia, unspecified: Secondary | ICD-10-CM | POA: Insufficient documentation

## 2018-01-04 DIAGNOSIS — C61 Malignant neoplasm of prostate: Secondary | ICD-10-CM | POA: Insufficient documentation

## 2018-01-04 DIAGNOSIS — I2510 Atherosclerotic heart disease of native coronary artery without angina pectoris: Secondary | ICD-10-CM | POA: Diagnosis not present

## 2018-01-04 DIAGNOSIS — I495 Sick sinus syndrome: Secondary | ICD-10-CM | POA: Diagnosis not present

## 2018-01-04 HISTORY — PX: CYSTOSCOPY W/ URETERAL STENT PLACEMENT: SHX1429

## 2018-01-04 LAB — GLUCOSE, CAPILLARY
Glucose-Capillary: 165 mg/dL — ABNORMAL HIGH (ref 70–99)
Glucose-Capillary: 130 mg/dL — ABNORMAL HIGH (ref 70–99)

## 2018-01-04 SURGERY — CYSTOSCOPY, FLEXIBLE, WITH STENT REPLACEMENT
Anesthesia: General | Laterality: Bilateral

## 2018-01-04 MED ORDER — STERILE WATER FOR IRRIGATION IR SOLN
Status: DC | PRN
  Administered 2018-01-04: 3000 mL

## 2018-01-04 MED ORDER — LIDOCAINE 2% (20 MG/ML) 5 ML SYRINGE
INTRAMUSCULAR | Status: DC | PRN
  Administered 2018-01-04: 60 mg via INTRAVENOUS

## 2018-01-04 MED ORDER — SUGAMMADEX SODIUM 500 MG/5ML IV SOLN
INTRAVENOUS | Status: AC
  Filled 2018-01-04: qty 5

## 2018-01-04 MED ORDER — PROPOFOL 10 MG/ML IV BOLUS
INTRAVENOUS | Status: AC
  Filled 2018-01-04: qty 20

## 2018-01-04 MED ORDER — PROPOFOL 10 MG/ML IV BOLUS
INTRAVENOUS | Status: DC | PRN
  Administered 2018-01-04: 150 mg via INTRAVENOUS

## 2018-01-04 MED ORDER — SODIUM CHLORIDE 0.9 % IV SOLN
2.0000 g | Freq: Once | INTRAVENOUS | Status: AC
  Administered 2018-01-04: 2 g via INTRAVENOUS
  Filled 2018-01-04: qty 20

## 2018-01-04 MED ORDER — FENTANYL CITRATE (PF) 100 MCG/2ML IJ SOLN
INTRAMUSCULAR | Status: DC | PRN
  Administered 2018-01-04: 25 ug via INTRAVENOUS

## 2018-01-04 MED ORDER — LIDOCAINE 2% (20 MG/ML) 5 ML SYRINGE
INTRAMUSCULAR | Status: AC
  Filled 2018-01-04: qty 5

## 2018-01-04 MED ORDER — EPHEDRINE SULFATE-NACL 50-0.9 MG/10ML-% IV SOSY
PREFILLED_SYRINGE | INTRAVENOUS | Status: DC | PRN
  Administered 2018-01-04 (×2): 10 mg via INTRAVENOUS
  Administered 2018-01-04: 5 mg via INTRAVENOUS

## 2018-01-04 MED ORDER — FENTANYL CITRATE (PF) 100 MCG/2ML IJ SOLN
INTRAMUSCULAR | Status: AC
  Filled 2018-01-04: qty 2

## 2018-01-04 MED ORDER — METOCLOPRAMIDE HCL 5 MG/ML IJ SOLN: 10.0000 mg | Freq: Once | INTRAMUSCULAR | Status: DC | PRN

## 2018-01-04 MED ORDER — LACTATED RINGERS IV SOLN
INTRAVENOUS | Status: DC
  Administered 2018-01-04: 09:00:00 via INTRAVENOUS

## 2018-01-04 MED ORDER — 0.9 % SODIUM CHLORIDE (POUR BTL) OPTIME
TOPICAL | Status: DC | PRN
  Administered 2018-01-04: 1000 mL

## 2018-01-04 MED ORDER — PHENYLEPHRINE 40 MCG/ML (10ML) SYRINGE FOR IV PUSH (FOR BLOOD PRESSURE SUPPORT)
PREFILLED_SYRINGE | INTRAVENOUS | Status: AC
  Filled 2018-01-04: qty 10

## 2018-01-04 MED ORDER — ONDANSETRON HCL 4 MG/2ML IJ SOLN
INTRAMUSCULAR | Status: AC
  Filled 2018-01-04: qty 2

## 2018-01-04 MED ORDER — FENTANYL CITRATE (PF) 100 MCG/2ML IJ SOLN: 25.0000 ug | INTRAMUSCULAR | Status: DC | PRN

## 2018-01-04 MED ORDER — ONDANSETRON HCL 4 MG/2ML IJ SOLN
INTRAMUSCULAR | Status: DC | PRN
  Administered 2018-01-04: 4 mg via INTRAVENOUS

## 2018-01-04 MED ORDER — MEPERIDINE HCL 50 MG/ML IJ SOLN: 6.2500 mg | INTRAMUSCULAR | Status: DC | PRN

## 2018-01-04 SURGICAL SUPPLY — 11 items
BAG URO CATCHER STRL LF (MISCELLANEOUS) ×3 IMPLANT
CATH INTERMIT  6FR 70CM (CATHETERS) ×3 IMPLANT
CLOTH BEACON ORANGE TIMEOUT ST (SAFETY) ×3 IMPLANT
GLOVE BIOGEL M STRL SZ7.5 (GLOVE) ×3 IMPLANT
GOWN STRL REUS W/TWL LRG LVL3 (GOWN DISPOSABLE) ×6 IMPLANT
GUIDEWIRE STR DUAL SENSOR (WIRE) ×3 IMPLANT
MANIFOLD NEPTUNE II (INSTRUMENTS) ×3 IMPLANT
PACK CYSTO (CUSTOM PROCEDURE TRAY) ×3 IMPLANT
STENT URO INLAY 6FRX24CM (STENTS) ×6 IMPLANT
TUBING CONNECTING 10 (TUBING) ×2 IMPLANT
TUBING CONNECTING 10' (TUBING) ×1

## 2018-01-04 NOTE — Anesthesia Preprocedure Evaluation (Signed)
Anesthesia Evaluation  Patient identified by MRN, date of birth, ID band Patient awake    Reviewed: Allergy & Precautions, H&P , NPO status , Patient's Chart, lab work & pertinent test results  Airway Mallampati: III  TM Distance: >3 FB Neck ROM: Full    Dental no notable dental hx. (+) Edentulous Upper, Dental Advisory Given   Pulmonary asthma , sleep apnea , former smoker,    Pulmonary exam normal breath sounds clear to auscultation       Cardiovascular hypertension, Pt. on medications + dysrhythmias Atrial Fibrillation  Rhythm:Regular Rate:Normal     Neuro/Psych negative neurological ROS  negative psych ROS   GI/Hepatic Neg liver ROS, GERD  Medicated and Controlled,  Endo/Other  diabetes, Insulin Dependent  Renal/GU Renal disease  negative genitourinary   Musculoskeletal  (+) Arthritis , Osteoarthritis,    Abdominal   Peds  Hematology negative hematology ROS (+) anemia ,   Anesthesia Other Findings   Reproductive/Obstetrics negative OB ROS                             Anesthesia Physical  Anesthesia Plan  ASA: III  Anesthesia Plan: General   Post-op Pain Management:    Induction: Intravenous  PONV Risk Score and Plan: 3 and Ondansetron and Treatment may vary due to age or medical condition  Airway Management Planned: LMA  Additional Equipment:   Intra-op Plan:   Post-operative Plan: Extubation in OR  Informed Consent: I have reviewed the patients History and Physical, chart, labs and discussed the procedure including the risks, benefits and alternatives for the proposed anesthesia with the patient or authorized representative who has indicated his/her understanding and acceptance.   Dental advisory given  Plan Discussed with: CRNA  Anesthesia Plan Comments:         Anesthesia Quick Evaluation

## 2018-01-04 NOTE — Anesthesia Postprocedure Evaluation (Signed)
Anesthesia Post Note  Patient: [name]  Procedure(s) Performed: CYSTOSCOPY WITH STENT EXCHANGE (Bilateral )     Patient location during evaluation: PACU Anesthesia Type: General Level of consciousness: awake and alert Pain management: pain level controlled Vital Signs Assessment: post-procedure vital signs reviewed and stable Respiratory status: spontaneous breathing, nonlabored ventilation, respiratory function stable and patient connected to nasal cannula oxygen Cardiovascular status: blood pressure returned to baseline and stable Postop Assessment: no apparent nausea or vomiting Anesthetic complications: no    Last Vitals:  Vitals:   01/04/18 1130 01/04/18 1205  BP: (!) 113/56 135/78  Pulse: 71 70  Resp: 10 20  Temp:  36.6 C  SpO2: 100% 100%    Last Pain:  Vitals:   01/04/18 1205  TempSrc:   PainSc: 0-No pain                 Montez Hageman

## 2018-01-04 NOTE — Anesthesia Procedure Notes (Addendum)
Procedure Name: LMA Insertion Date/Time: 01/04/2018 10:58 AM Performed by: Maxwell Caul, CRNA Pre-anesthesia Checklist: Patient identified, Emergency Drugs available, Suction available and Patient being monitored Patient Re-evaluated:Patient Re-evaluated prior to induction Oxygen Delivery Method: Circle system utilized Preoxygenation: Pre-oxygenation with 100% oxygen Induction Type: IV induction LMA: LMA inserted LMA Size: 4.0 Number of attempts: 1 Placement Confirmation: positive ETCO2 and breath sounds checked- equal and bilateral Tube secured with: Tape Dental Injury: Teeth and Oropharynx as per pre-operative assessment

## 2018-01-04 NOTE — Interval H&P Note (Signed)
History and Physical Interval Note:  01/04/2018 10:23 AM  [name]  has presented today for surgery, with the diagnosis of PROSTATE CANCER, BILATERAL URETERAL OBSTRUCTION  The various methods of treatment have been discussed with the patient and family. After consideration of risks, benefits and other options for treatment, the patient has consented to  Procedure(s): CYSTOSCOPY WITH STENT EXCHANGE (Bilateral) as a surgical intervention .  The patient's history has been reviewed, patient examined, no change in status, stable for surgery.  I have reviewed the patient's chart and labs.  Questions were answered to the patient's satisfaction.     [name],LES

## 2018-01-04 NOTE — Discharge Instructions (Signed)

## 2018-01-04 NOTE — Op Note (Signed)
Preoperative diagnosis:  1. Metastatic prostate cancer 2. Bilateral ureteral obstruction   Postoperative diagnosis:  1. Metastatic prostate cancer 2. Bilateral ureteral obstruction   Procedure:  1. Cystoscopy 2. Bilateral ureteral stent placement (6 x 24 Bard Inlay Optima)  Surgeon: Roxy Horseman, Brooke Bonito. M.D.  Anesthesia: General  Complications: None  EBL: Minimal  Specimens: None  Indication: [name] is a 84 y.o. patient with ureteral obstruction. After reviewing the management options for treatment, he elected to proceed with the above surgical procedure(s). We have discussed the potential benefits and risks of the procedure, side effects of the proposed treatment, the likelihood of the patient achieving the goals of the procedure, and any potential problems that might occur during the procedure or recuperation. Informed consent has been obtained.  Description of procedure:  The patient was taken to the operating room and general anesthesia was induced.  The patient was placed in the dorsal lithotomy position, prepped and draped in the usual sterile fashion, and preoperative antibiotics were administered. A preoperative time-out was performed.   Cystourethroscopy was performed.  The patient's urethra was examined and was unremarkable. The bladder was then systematically examined in its entirety. There was no evidence for any bladder tumors, stones, or other mucosal pathology.    Attention then turned to the right ureteral orifice and the patient's indwelling ureteral stent was identified and brought out to the urethral meatus with the flexible graspers.  A 0.38 sensor guidewire was then advanced up the right ureter into the renal pelvis under fluoroscopic guidance.  The wire was then backloaded through the cystoscope and a ureteral stent was advance over the wire using Seldinger technique.  The stent was positioned appropriately under fluoroscopic and  cystoscopic guidance after a retrograde pyelogram was performed to confirm appropriate position in the renal collecting system.  The wire was then removed with an adequate stent curl noted in the renal pelvis as well as in the bladder.  Attention then turned to the left ureteral orifice and the patient's indwelling ureteral stent was identified and brought out to the urethral meatus with the flexible graspers.  A 0.38 sensor guidewire was then advanced up the left ureter into the renal pelvis under fluoroscopic guidance.  The wire was then backloaded through the cystoscope and a ureteral stent was advance over the wire using Seldinger technique.  The stent was positioned appropriately under fluoroscopic and cystoscopic guidance after confirming the appropriate position with a retrograde pyelogram as above.  The wire was then removed with an adequate stent curl noted in the renal pelvis as well as in the bladder.  The bladder was then emptied and the procedure ended.  The patient appeared to tolerate the procedure well and without complications.  The patient was able to be awakened and transferred to the recovery unit in satisfactory condition.

## 2018-01-04 NOTE — Transfer of Care (Signed)
Immediate Anesthesia Transfer of Care Note  Patient: [name]  Procedure(s) Performed: CYSTOSCOPY WITH STENT EXCHANGE (Bilateral )  Patient Location: PACU  Anesthesia Type:General  Level of Consciousness: awake, alert , oriented and patient cooperative  Airway & Oxygen Therapy: Patient Spontanous Breathing and Patient connected to face mask oxygen  Post-op Assessment: Report given to RN, Post -op Vital signs reviewed and stable and Patient moving all extremities  Post vital signs: Reviewed and stable  Last Vitals:  Vitals Value Taken Time  BP    Temp    Pulse 73 01/04/2018 11:27 AM  Resp 13 01/04/2018 11:27 AM  SpO2 99 % 01/04/2018 11:27 AM  Vitals shown include unvalidated device data.  Last Pain:  Vitals:   01/04/18 0916  TempSrc:   PainSc: 0-No pain         Complications: No apparent anesthesia complications

## 2018-01-05 ENCOUNTER — Encounter (HOSPITAL_COMMUNITY): Payer: Self-pay | Admitting: Urology

## 2018-01-05 DIAGNOSIS — G4733 Obstructive sleep apnea (adult) (pediatric): Secondary | ICD-10-CM

## 2018-01-09 ENCOUNTER — Other Ambulatory Visit: Payer: Self-pay | Admitting: *Deleted

## 2018-01-09 ENCOUNTER — Telehealth: Payer: Self-pay | Admitting: Pulmonary Disease

## 2018-01-09 DIAGNOSIS — G4733 Obstructive sleep apnea (adult) (pediatric): Secondary | ICD-10-CM

## 2018-01-09 NOTE — Telephone Encounter (Signed)
HST 01/05/18 >> AHI 31.7, SpO2 low 77%   Please let him know his sleep study shows severe obstructive sleep apnea.  Please schedule ROV with me or NP to discuss tx options.

## 2018-01-11 NOTE — Telephone Encounter (Signed)
Attempted to call patient today regarding results. I did not receive an answer at time of call. I have left a voicemail message for pt to return call. X1  

## 2018-01-12 ENCOUNTER — Telehealth: Payer: Self-pay | Admitting: Pulmonary Disease

## 2018-01-12 DIAGNOSIS — R0989 Other specified symptoms and signs involving the circulatory and respiratory systems: Secondary | ICD-10-CM | POA: Diagnosis not present

## 2018-01-12 DIAGNOSIS — R05 Cough: Secondary | ICD-10-CM | POA: Diagnosis not present

## 2018-01-12 NOTE — Telephone Encounter (Signed)
Attempted to call patient today regarding results. I did not receive an answer at time of call. I have left a voicemail message for pt to return call. X2  

## 2018-01-12 NOTE — Telephone Encounter (Signed)
Lisa-pts daughter returning call to [facility] about HST results; results given and OV made Tuesday 01-16-18 at 4:00pm with Wyn Quaker, NP. Nothing more needed at this time.

## 2018-01-15 NOTE — Telephone Encounter (Signed)
Called and spoke with patient regarding results.  Informed the patient of results and recommendations today. appt scheduled with B.Mack 01-16-18 at 4pm. Pt verbalized understanding and denied any questions or concerns at this time.  Nothing further needed.

## 2018-01-16 ENCOUNTER — Encounter: Payer: Self-pay | Admitting: Pulmonary Disease

## 2018-01-16 ENCOUNTER — Ambulatory Visit (INDEPENDENT_AMBULATORY_CARE_PROVIDER_SITE_OTHER): Payer: Medicare Other | Admitting: Pulmonary Disease

## 2018-01-16 VITALS — BP 102/50 | HR 75 | Ht 66.5 in | Wt 168.6 lb

## 2018-01-16 DIAGNOSIS — G4733 Obstructive sleep apnea (adult) (pediatric): Secondary | ICD-10-CM | POA: Diagnosis not present

## 2018-01-16 NOTE — Assessment & Plan Note (Signed)
Follow-up with our office in the next 6 to 8 weeks to show 30-day compliance of CPAP use  New CPAP start >>>APAP settings 5-15 >>>Mask of choice  We recommend that you start using your CPAP daily >>>Keep up the hard work using your device >>> Goal should be wearing this for the entire night that you are sleeping, at least 4 to 6 hours  Remember:  . Do not drive or operate heavy machinery if tired or drowsy.  . Please notify the supply company and office if you are unable to use your device regularly due to missing supplies or machine being broken.  . Work on maintaining a healthy weight and following your recommended nutrition plan  . Maintain proper daily exercise and movement  . Maintaining proper use of your device can also help improve management of other chronic illnesses such as: Blood pressure, blood sugars, and weight management.   BiPAP/ CPAP Cleaning:  >>>Clean weekly, with Dawn soap, and bottle brush.  Set up to air dry.
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
## 2018-01-16 NOTE — Patient Instructions (Addendum)
Follow-up with our office in the next 6 to 8 weeks to show 30-day compliance of CPAP use  New CPAP start >>>APAP settings 5-15 >>>Mask of choice  We recommend that you start using your CPAP daily >>>Keep up the hard work using your device >>> Goal should be wearing this for the entire night that you are sleeping, at least 4 to 6 hours  Remember:  . Do not drive or operate heavy machinery if tired or drowsy.  . Please notify the supply company and office if you are unable to use your device regularly due to missing supplies or machine being broken.  . Work on maintaining a healthy weight and following your recommended nutrition plan  . Maintain proper daily exercise and movement  . Maintaining proper use of your device can also help improve management of other chronic illnesses such as: Blood pressure, blood sugars, and weight management.   BiPAP/ CPAP Cleaning:  >>>Clean weekly, with Dawn soap, and bottle brush.  Set up to air dry.     It is flu season:   >>>Remember to be washing your hands regularly, using hand sanitizer, be careful to use around herself with has contact with people who are sick will increase her chances of getting sick yourself. >>> Best ways to protect herself from the flu: Receive the yearly flu vaccine, practice good hand hygiene washing with soap and also using hand sanitizer when available, eat a nutritious meals, get adequate rest, hydrate appropriately    As of 02/26/2018 we will be moving! [place]   Our new address and phone number will be:  [address] [place], [place] [address] Telephone number: [phone]   Please contact the office if your symptoms worsen or you have concerns that you are not improving.   Thank you for choosing [facility] for your healthcare, and for allowing [place] to partner with you on your healthcare journey. I am thankful to be able to provide care to you today.    Wyn Quaker FNP-C       CPAP and BiPAP Information CPAP and BiPAP are methods of helping a person breathe with the use of air pressure. CPAP stands for "continuous positive airway pressure." BiPAP stands for "bi-level positive airway pressure." In both methods, air is blown through your nose or mouth and into your air passages to help you breathe well. CPAP and BiPAP use different amounts of pressure to blow air. With CPAP, the amount of pressure stays the same while you breathe in and out. With BiPAP, the amount of pressure is increased when you breathe in (inhale) so that you can take larger breaths. Your health care provider will recommend whether CPAP or BiPAP would be more helpful for you. Why are CPAP and BiPAP treatments used? CPAP or BiPAP can be helpful if you have:  Sleep apnea.  Chronic obstructive pulmonary disease (COPD).  Heart failure.  Medical conditions that weaken the muscles of the chest including muscular dystrophy, or neurological diseases such as amyotrophic lateral sclerosis (ALS).  Other problems that cause breathing to be weak, abnormal, or difficult.  CPAP is most commonly used for obstructive sleep apnea (OSA) to keep the airways from collapsing when the muscles relax during sleep. How is CPAP or BiPAP administered? Both CPAP and BiPAP are provided by a small machine with a flexible plastic tube that attaches to a plastic mask. You wear the mask. Air is blown through  the mask into your nose or mouth. The amount of pressure that is used to blow the air can be adjusted on the machine. Your health care provider will determine the pressure setting that should be used based on your individual needs. When should CPAP or BiPAP be used? In most cases, the mask only needs to be worn during sleep. Generally, the mask needs to be worn throughout the night and during any daytime naps. People with certain medical conditions may also need to wear the mask at other times  when they are awake. Follow instructions from your health care provider about when to use the machine. What are some tips for using the mask?  Because the mask needs to be snug, some people feel trapped or closed-in (claustrophobic) when first using the mask. If you feel this way, you may need to get used to the mask. One way to do this is by holding the mask loosely over your nose or mouth and then gradually applying the mask more snugly. You can also gradually increase the amount of time that you use the mask.  Masks are available in various types and sizes. Some fit over your mouth and nose while others fit over just your nose. If your mask does not fit well, talk with your health care provider about getting a different one.  If you are using a mask that fits over your nose and you tend to breathe through your mouth, a chin strap may be applied to help keep your mouth closed.  The CPAP and BiPAP machines have alarms that may sound if the mask comes off or develops a leak.  If you have trouble with the mask, it is very important that you talk with your health care provider about finding a way to make the mask easier to tolerate. Do not stop using the mask. Stopping the use of the mask could have a negative impact on your health. What are some tips for using the machine?  Place your CPAP or BiPAP machine on a secure table or stand near an electrical outlet.  Know where the on/off switch is located on the machine.  Follow instructions from your health care provider about how to set the pressure on your machine and when you should use it.  Do not eat or drink while the CPAP or BiPAP machine is on. Food or fluids could get pushed into your lungs by the pressure of the CPAP or BiPAP.  Do not smoke. Tobacco smoke residue can damage the machine.  For home use, CPAP and BiPAP machines can be rented or purchased through home health care companies. Many different brands of machines are available.  Renting a machine before purchasing may help you find out which particular machine works well for you.  Keep the CPAP or BiPAP machine and attachments clean. Ask your health care provider for specific instructions. Get help right away if:  You have redness or open areas around your nose or mouth where the mask fits.  You have trouble using the CPAP or BiPAP machine.  You cannot tolerate wearing the CPAP or BiPAP mask.  You have pain, discomfort, and bloating in your abdomen. Summary  CPAP and BiPAP are methods of helping a person breathe with the use of air pressure.  Both CPAP and BiPAP are provided by a small machine with a flexible plastic tube that attaches to a plastic mask.  If you have trouble with the mask, it is very important that you  talk with your health care provider about finding a way to make the mask easier to tolerate. This information is not intended to replace advice given to you by your health care provider. Make sure you discuss any questions you have with your health care provider. Document Released: 01/08/2004 Document Revised: 02/29/2016 Document Reviewed: 02/29/2016 Elsevier Interactive Patient Education  2017 [employer].

## 2018-01-23 NOTE — Progress Notes (Signed)
Reviewed and agree with assessment/plan.   Keasia Dubose, MD [facility] 04/20/2016, 12:24 PM Pager:  [phone]  

## 2018-01-26 ENCOUNTER — Ambulatory Visit (HOSPITAL_COMMUNITY): Payer: Medicare Other

## 2018-02-05 DIAGNOSIS — H40023 Open angle with borderline findings, high risk, bilateral: Secondary | ICD-10-CM | POA: Diagnosis not present
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
## 2018-02-15 ENCOUNTER — Encounter: Payer: Self-pay | Admitting: Cardiovascular Disease

## 2018-02-15 ENCOUNTER — Ambulatory Visit (INDEPENDENT_AMBULATORY_CARE_PROVIDER_SITE_OTHER): Payer: Medicare Other | Admitting: Cardiovascular Disease

## 2018-02-15 VITALS — BP 118/54 | HR 61 | Ht 66.5 in | Wt 164.0 lb

## 2018-02-15 DIAGNOSIS — N200 Calculus of kidney: Secondary | ICD-10-CM | POA: Insufficient documentation

## 2018-02-15 DIAGNOSIS — E782 Mixed hyperlipidemia: Secondary | ICD-10-CM

## 2018-02-15 DIAGNOSIS — N183 Chronic kidney disease, stage 3 unspecified: Secondary | ICD-10-CM

## 2018-02-15 DIAGNOSIS — I10 Essential (primary) hypertension: Secondary | ICD-10-CM | POA: Diagnosis not present

## 2018-02-15 DIAGNOSIS — Z7901 Long term (current) use of anticoagulants: Secondary | ICD-10-CM | POA: Diagnosis not present

## 2018-02-15 DIAGNOSIS — C61 Malignant neoplasm of prostate: Secondary | ICD-10-CM | POA: Diagnosis not present

## 2018-02-15 DIAGNOSIS — G4733 Obstructive sleep apnea (adult) (pediatric): Secondary | ICD-10-CM | POA: Diagnosis not present

## 2018-02-15 DIAGNOSIS — I25810 Atherosclerosis of coronary artery bypass graft(s) without angina pectoris: Secondary | ICD-10-CM

## 2018-02-15 DIAGNOSIS — C8209 Follicular lymphoma grade I, extranodal and solid organ sites: Secondary | ICD-10-CM | POA: Diagnosis not present

## 2018-02-15 DIAGNOSIS — E0822 Diabetes mellitus due to underlying condition with diabetic chronic kidney disease: Secondary | ICD-10-CM

## 2018-02-15 DIAGNOSIS — I25118 Atherosclerotic heart disease of native coronary artery with other forms of angina pectoris: Secondary | ICD-10-CM | POA: Diagnosis not present

## 2018-02-15 DIAGNOSIS — I480 Paroxysmal atrial fibrillation: Secondary | ICD-10-CM | POA: Diagnosis not present

## 2018-02-15 DIAGNOSIS — M67919 Unspecified disorder of synovium and tendon, unspecified shoulder: Secondary | ICD-10-CM | POA: Insufficient documentation

## 2018-02-15 DIAGNOSIS — K602 Anal fissure, unspecified: Secondary | ICD-10-CM | POA: Insufficient documentation

## 2018-02-15 NOTE — Patient Instructions (Signed)
Medication Instructions:  Dr Croitoru recommends that you continue on your current medications as directed. Please refer to the Current Medication list given to you today.  If you need a refill on your cardiac medications before your next appointment, please call your pharmacy.   Follow-Up: At [facility], you and your health needs are our priority.  As part of our continuing mission to provide you with exceptional heart care, we have created designated Provider Care Teams.  These Care Teams include your primary Cardiologist (physician) and Advanced Practice Providers (APPs -  Physician Assistants and Nurse Practitioners) who all work together to provide you with the care you need, when you need it. You will need a follow up appointment in 12 months.  Please call our office 2 months in advance to schedule this appointment.  You may see [name], MD or one of the following Advanced Practice Providers on your designated Care Team: Hao Meng, PA-C . Angela Duke, PA-C 

## 2018-02-22 ENCOUNTER — Other Ambulatory Visit: Payer: Self-pay | Admitting: *Deleted

## 2018-02-22 ENCOUNTER — Inpatient Hospital Stay: Payer: Medicare Other

## 2018-02-22 ENCOUNTER — Inpatient Hospital Stay (HOSPITAL_BASED_OUTPATIENT_CLINIC_OR_DEPARTMENT_OTHER): Payer: Medicare Other | Admitting: Oncology

## 2018-02-22 ENCOUNTER — Telehealth: Payer: Self-pay | Admitting: Oncology

## 2018-02-22 ENCOUNTER — Inpatient Hospital Stay: Payer: Medicare Other | Attending: Oncology

## 2018-02-22 VITALS — BP 119/62 | HR 70 | Temp 97.8°F | Resp 18 | Ht 66.5 in | Wt 166.9 lb

## 2018-02-22 VITALS — BP 110/56 | HR 65 | Resp 16

## 2018-02-22 DIAGNOSIS — Z5112 Encounter for antineoplastic immunotherapy: Secondary | ICD-10-CM | POA: Insufficient documentation

## 2018-02-22 DIAGNOSIS — C8593 Non-Hodgkin lymphoma, unspecified, intra-abdominal lymph nodes: Secondary | ICD-10-CM

## 2018-02-22 DIAGNOSIS — N189 Chronic kidney disease, unspecified: Secondary | ICD-10-CM | POA: Diagnosis not present

## 2018-02-22 DIAGNOSIS — D509 Iron deficiency anemia, unspecified: Secondary | ICD-10-CM | POA: Diagnosis not present

## 2018-02-22 DIAGNOSIS — I129 Hypertensive chronic kidney disease with stage 1 through stage 4 chronic kidney disease, or unspecified chronic kidney disease: Secondary | ICD-10-CM | POA: Insufficient documentation

## 2018-02-22 DIAGNOSIS — C8298 Follicular lymphoma, unspecified, lymph nodes of multiple sites: Secondary | ICD-10-CM

## 2018-02-22 DIAGNOSIS — Z79899 Other long term (current) drug therapy: Secondary | ICD-10-CM

## 2018-02-22 DIAGNOSIS — R197 Diarrhea, unspecified: Secondary | ICD-10-CM | POA: Diagnosis not present

## 2018-02-22 DIAGNOSIS — I480 Paroxysmal atrial fibrillation: Secondary | ICD-10-CM | POA: Insufficient documentation

## 2018-02-22 DIAGNOSIS — Z87891 Personal history of nicotine dependence: Secondary | ICD-10-CM | POA: Insufficient documentation

## 2018-02-22 DIAGNOSIS — Z7901 Long term (current) use of anticoagulants: Secondary | ICD-10-CM | POA: Diagnosis not present

## 2018-02-22 DIAGNOSIS — C61 Malignant neoplasm of prostate: Secondary | ICD-10-CM

## 2018-02-22 DIAGNOSIS — C8580 Other specified types of non-Hodgkin lymphoma, unspecified site: Secondary | ICD-10-CM

## 2018-02-22 DIAGNOSIS — E1122 Type 2 diabetes mellitus with diabetic chronic kidney disease: Secondary | ICD-10-CM | POA: Insufficient documentation

## 2018-02-22 DIAGNOSIS — I25810 Atherosclerosis of coronary artery bypass graft(s) without angina pectoris: Secondary | ICD-10-CM | POA: Diagnosis not present

## 2018-02-22 DIAGNOSIS — C8590 Non-Hodgkin lymphoma, unspecified, unspecified site: Secondary | ICD-10-CM

## 2018-02-22 DIAGNOSIS — C8219 Follicular lymphoma grade II, extranodal and solid organ sites: Secondary | ICD-10-CM

## 2018-02-22 DIAGNOSIS — C8203 Follicular lymphoma grade I, intra-abdominal lymph nodes: Secondary | ICD-10-CM

## 2018-02-22 DIAGNOSIS — K591 Functional diarrhea: Secondary | ICD-10-CM

## 2018-02-22 LAB — CBC WITH DIFFERENTIAL/PLATELET
Abs Immature Granulocytes: 0.08 10*3/uL — ABNORMAL HIGH (ref 0.00–0.07)
Basophils Absolute: 0 10*3/uL (ref 0.0–0.1)
Basophils Relative: 0 %
EOS ABS: 0.2 10*3/uL (ref 0.0–0.5)
EOS PCT: 2 %
HCT: 31.8 % — ABNORMAL LOW (ref 39.0–52.0)
Hemoglobin: 10.4 g/dL — ABNORMAL LOW (ref 13.0–17.0)
Immature Granulocytes: 1 %
Lymphocytes Relative: 1 %
Lymphs Abs: 0.2 10*3/uL — ABNORMAL LOW (ref 0.7–4.0)
MCH: 27.3 pg (ref 26.0–34.0)
MCHC: 32.7 g/dL (ref 30.0–36.0)
MCV: 83.5 fL (ref 80.0–100.0)
MONO ABS: 0.5 10*3/uL (ref 0.1–1.0)
MONOS PCT: 5 %
NRBC: 0 % (ref 0.0–0.2)
Neutro Abs: 9.8 10*3/uL — ABNORMAL HIGH (ref 1.7–7.7)
Neutrophils Relative %: 91 %
Platelets: 127 10*3/uL — ABNORMAL LOW (ref 150–400)
RBC: 3.81 MIL/uL — ABNORMAL LOW (ref 4.22–5.81)
RDW: 14.9 % (ref 11.5–15.5)
WBC: 10.8 10*3/uL — ABNORMAL HIGH (ref 4.0–10.5)

## 2018-02-22 LAB — COMPREHENSIVE METABOLIC PANEL
ALK PHOS: 74 U/L (ref 38–126)
ALT: 19 U/L (ref 0–44)
AST: 22 U/L (ref 15–41)
Albumin: 3.7 g/dL (ref 3.5–5.0)
Anion gap: 8 (ref 5–15)
BUN: 24 mg/dL — AB (ref 8–23)
CALCIUM: 9 mg/dL (ref 8.9–10.3)
CO2: 22 mmol/L (ref 22–32)
CREATININE: 1.49 mg/dL — AB (ref 0.61–1.24)
Chloride: 105 mmol/L (ref 98–111)
GFR calc non Af Amer: 41 mL/min — ABNORMAL LOW (ref >60)
GFR, EST AFRICAN AMERICAN: 48 mL/min — AB (ref >60)
GLUCOSE: 311 mg/dL — AB (ref 70–99)
Potassium: 4.4 mmol/L (ref 3.5–5.1)
Sodium: 135 mmol/L (ref 135–145)
Total Bilirubin: 0.4 mg/dL (ref 0.3–1.2)
Total Protein: 6.5 g/dL (ref 6.5–8.1)

## 2018-02-22 LAB — LACTATE DEHYDROGENASE: LDH: 124 U/L (ref 98–192)

## 2018-02-22 MED ORDER — DIPHENHYDRAMINE HCL 25 MG PO CAPS
25.0000 mg | ORAL_CAPSULE | Freq: Once | ORAL | Status: AC
  Administered 2018-02-22: 25 mg via ORAL

## 2018-02-22 MED ORDER — LOPERAMIDE HCL 2 MG PO TABS: 2.0000 mg | ORAL_TABLET | Freq: Once | ORAL | Status: DC

## 2018-02-22 MED ORDER — LOPERAMIDE HCL 2 MG PO CAPS
ORAL_CAPSULE | ORAL | Status: AC
  Filled 2018-02-22: qty 1

## 2018-02-22 MED ORDER — ACETAMINOPHEN 325 MG PO TABS
650.0000 mg | ORAL_TABLET | Freq: Once | ORAL | Status: AC
  Administered 2018-02-22: 650 mg via ORAL

## 2018-02-22 MED ORDER — SODIUM CHLORIDE 0.9 % IV SOLN
350.0000 mg/m2 | Freq: Once | INTRAVENOUS | Status: AC
  Administered 2018-02-22: 700 mg via INTRAVENOUS
  Filled 2018-02-22: qty 50

## 2018-02-22 MED ORDER — LOPERAMIDE HCL 2 MG PO CAPS: 2.0000 mg | ORAL_CAPSULE | ORAL | Status: DC | PRN

## 2018-02-22 MED ORDER — HEPARIN SOD (PORK) LOCK FLUSH 100 UNIT/ML IV SOLN
500.0000 [IU] | Freq: Once | INTRAVENOUS | Status: AC | PRN
  Administered 2018-02-22: 500 [IU]
  Filled 2018-02-22: qty 5

## 2018-02-22 MED ORDER — SODIUM CHLORIDE 0.9 % IJ SOLN
10.0000 mL | INTRAMUSCULAR | Status: DC | PRN
  Administered 2018-02-22: 10 mL
  Filled 2018-02-22: qty 10

## 2018-02-22 MED ORDER — LOPERAMIDE HCL 2 MG PO CAPS
2.0000 mg | ORAL_CAPSULE | Freq: Once | ORAL | Status: AC
  Administered 2018-02-22: 2 mg via ORAL

## 2018-02-22 MED ORDER — DIPHENHYDRAMINE HCL 25 MG PO CAPS
ORAL_CAPSULE | ORAL | Status: AC
  Filled 2018-02-22: qty 1

## 2018-02-22 MED ORDER — SODIUM CHLORIDE 0.9 % IV SOLN
Freq: Once | INTRAVENOUS | Status: AC
  Administered 2018-02-22: 11:00:00 via INTRAVENOUS
  Filled 2018-02-22: qty 250

## 2018-02-22 MED ORDER — ACETAMINOPHEN 325 MG PO TABS
ORAL_TABLET | ORAL | Status: AC
  Filled 2018-02-22: qty 2

## 2018-02-22 NOTE — Telephone Encounter (Signed)
Gave avs and calendar ° °
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
## 2018-03-02 DIAGNOSIS — I10 Essential (primary) hypertension: Secondary | ICD-10-CM | POA: Diagnosis not present

## 2018-03-02 DIAGNOSIS — E782 Mixed hyperlipidemia: Secondary | ICD-10-CM | POA: Diagnosis not present

## 2018-03-02 DIAGNOSIS — E119 Type 2 diabetes mellitus without complications: Secondary | ICD-10-CM | POA: Diagnosis not present

## 2018-03-05 ENCOUNTER — Ambulatory Visit: Payer: Medicare Other | Admitting: Pulmonary Disease

## 2018-03-16 DIAGNOSIS — I4891 Unspecified atrial fibrillation: Secondary | ICD-10-CM | POA: Diagnosis not present

## 2018-03-16 DIAGNOSIS — I10 Essential (primary) hypertension: Secondary | ICD-10-CM | POA: Diagnosis not present

## 2018-03-16 DIAGNOSIS — E1165 Type 2 diabetes mellitus with hyperglycemia: Secondary | ICD-10-CM | POA: Diagnosis not present

## 2018-03-16 DIAGNOSIS — E782 Mixed hyperlipidemia: Secondary | ICD-10-CM | POA: Diagnosis not present

## 2018-03-20 ENCOUNTER — Ambulatory Visit: Payer: Medicare Other | Admitting: Pulmonary Disease

## 2018-03-21 ENCOUNTER — Encounter: Payer: Self-pay | Admitting: Pulmonary Disease

## 2018-03-21 ENCOUNTER — Ambulatory Visit (INDEPENDENT_AMBULATORY_CARE_PROVIDER_SITE_OTHER): Payer: Medicare Other | Admitting: Pulmonary Disease

## 2018-03-21 VITALS — BP 106/52 | HR 63 | Ht 67.0 in | Wt 164.8 lb

## 2018-03-21 DIAGNOSIS — G4733 Obstructive sleep apnea (adult) (pediatric): Secondary | ICD-10-CM | POA: Diagnosis not present
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
## 2018-03-21 NOTE — Assessment & Plan Note (Signed)
We will order a mask fitting in the sleep lab   We recommend that you continue using your CPAP daily >>>Keep up the hard work using your device >>> Goal should be wearing this for the entire night that you are sleeping, at least 4 to 6 hours  Remember:  . Do not drive or operate heavy machinery if tired or drowsy.  . Please notify the supply company and office if you are unable to use your device regularly due to missing supplies or machine being broken.  . Work on maintaining a healthy weight and following your recommended nutrition plan  . Maintain proper daily exercise and movement  . Maintaining proper use of your device can also help improve management of other chronic illnesses such as: Blood pressure, blood sugars, and weight management.   BiPAP/ CPAP Cleaning:  >>>Clean weekly, with Dawn soap, and bottle brush.  Set up to air dry.    Follow up in 6 months or sooner if you have CPAP compliance issues

## 2018-03-21 NOTE — Patient Instructions (Addendum)
We will order a mask fitting in the sleep lab   We recommend that you continue using your CPAP daily >>>Keep up the hard work using your device >>> Goal should be wearing this for the entire night that you are sleeping, at least 4 to 6 hours  Remember:  . Do not drive or operate heavy machinery if tired or drowsy.  . Please notify the supply company and office if you are unable to use your device regularly due to missing supplies or machine being broken.  . Work on maintaining a healthy weight and following your recommended nutrition plan  . Maintain proper daily exercise and movement  . Maintaining proper use of your device can also help improve management of other chronic illnesses such as: Blood pressure, blood sugars, and weight management.   BiPAP/ CPAP Cleaning:  >>>Clean weekly, with Dawn soap, and bottle brush.  Set up to air dry.    Follow up in 6 months or sooner if you have CPAP compliance issues     It is flu season:   >>>Remember to be washing your hands regularly, using hand sanitizer, be careful to use around herself with has contact with people who are sick will increase her chances of getting sick yourself. >>> Best ways to protect herself from the flu: Receive the yearly flu vaccine, practice good hand hygiene washing with soap and also using hand sanitizer when available, eat a nutritious meals, get adequate rest, hydrate appropriately   Please contact the office if your symptoms worsen or you have concerns that you are not improving.   Thank you for choosing [facility] for your healthcare, and for allowing [place] to partner with you on your healthcare journey. I am thankful to be able to provide care to you today.   Wyn Quaker FNP-C    Sleep Apnea Sleep apnea is a condition that affects breathing. People with sleep apnea have moments during sleep when their breathing pauses briefly or gets shallow. Sleep apnea can cause these symptoms:  Trouble  staying asleep.  Sleepiness or tiredness during the day.  Irritability.  Loud snoring.  Morning headaches.  Trouble concentrating.  Forgetting things.  Less interest in sex.  Being sleepy for no reason.  Mood swings.  Personality changes.  Depression.  Waking up a lot during the night to pee (urinate).  Dry mouth.  Sore throat.  Follow these instructions at home:  Make any changes in your routine that your doctor recommends.  Eat a healthy, well-balanced diet.  Take over-the-counter and prescription medicines only as told by your doctor.  Avoid using alcohol, calming medicines (sedatives), and narcotic medicines.  Take steps to lose weight if you are overweight.  If you were given a machine (device) to use while you sleep, use it only as told by your doctor.  Do not use any tobacco products, such as cigarettes, chewing tobacco, and e-cigarettes. If you need help quitting, ask your doctor.  Keep all follow-up visits as told by your doctor. This is important. Contact a doctor if:  The machine that you were given to use during sleep is uncomfortable or does not seem to be working.  Your symptoms do not get better.  Your symptoms get worse. Get help right away if:  Your chest hurts.  You have trouble breathing in enough air (shortness of breath).  You have an uncomfortable feeling in your back, arms, or stomach.  You have trouble talking.  One side of your body  feels weak.  A part of your face is hanging down (drooping). These symptoms may be an emergency. Do not wait to see if the symptoms will go away. Get medical help right away. Call your local emergency services (911 in the [place] Do not drive yourself to the hospital. This information is not intended to replace advice given to you by your health care provider. Make sure you discuss any questions you have with your health care provider. Document Released: 01/19/2008 Document Revised: 12/06/2015  Document Reviewed: 01/19/2015 Elsevier Interactive Patient Education  2018 [place].    CPAP and BiPAP Information CPAP and BiPAP are methods of helping a person breathe with the use of air pressure. CPAP stands for "continuous positive airway pressure." BiPAP stands for "bi-level positive airway pressure." In both methods, air is blown through your nose or mouth and into your air passages to help you breathe well. CPAP and BiPAP use different amounts of pressure to blow air. With CPAP, the amount of pressure stays the same while you breathe in and out. With BiPAP, the amount of pressure is increased when you breathe in (inhale) so that you can take larger breaths. Your health care provider will recommend whether CPAP or BiPAP would be more helpful for you. Why are CPAP and BiPAP treatments used? CPAP or BiPAP can be helpful if you have:  Sleep apnea.  Chronic obstructive pulmonary disease (COPD).  Heart failure.  Medical conditions that weaken the muscles of the chest including muscular dystrophy, or neurological diseases such as amyotrophic lateral sclerosis (ALS).  Other problems that cause breathing to be weak, abnormal, or difficult.  CPAP is most commonly used for obstructive sleep apnea (OSA) to keep the airways from collapsing when the muscles relax during sleep. How is CPAP or BiPAP administered? Both CPAP and BiPAP are provided by a small machine with a flexible plastic tube that attaches to a plastic mask. You wear the mask. Air is blown through the mask into your nose or mouth. The amount of pressure that is used to blow the air can be adjusted on the machine. Your health care provider will determine the pressure setting that should be used based on your individual needs. When should CPAP or BiPAP be used? In most cases, the mask only needs to be worn during sleep. Generally, the mask needs to be worn throughout the night and during any daytime naps. People with certain  medical conditions may also need to wear the mask at other times when they are awake. Follow instructions from your health care provider about when to use the machine. What are some tips for using the mask?  Because the mask needs to be snug, some people feel trapped or closed-in (claustrophobic) when first using the mask. If you feel this way, you may need to get used to the mask. One way to do this is by holding the mask loosely over your nose or mouth and then gradually applying the mask more snugly. You can also gradually increase the amount of time that you use the mask.  Masks are available in various types and sizes. Some fit over your mouth and nose while others fit over just your nose. If your mask does not fit well, talk with your health care provider about getting a different one.  If you are using a mask that fits over your nose and you tend to breathe through your mouth, a chin strap may be applied to help keep your mouth closed.  The CPAP and BiPAP machines have alarms that may sound if the mask comes off or develops a leak.  If you have trouble with the mask, it is very important that you talk with your health care provider about finding a way to make the mask easier to tolerate. Do not stop using the mask. Stopping the use of the mask could have a negative impact on your health. What are some tips for using the machine?  Place your CPAP or BiPAP machine on a secure table or stand near an electrical outlet.  Know where the on/off switch is located on the machine.  Follow instructions from your health care provider about how to set the pressure on your machine and when you should use it.  Do not eat or drink while the CPAP or BiPAP machine is on. Food or fluids could get pushed into your lungs by the pressure of the CPAP or BiPAP.  Do not smoke. Tobacco smoke residue can damage the machine.  For home use, CPAP and BiPAP machines can be rented or purchased through home health  care companies. Many different brands of machines are available. Renting a machine before purchasing may help you find out which particular machine works well for you.  Keep the CPAP or BiPAP machine and attachments clean. Ask your health care provider for specific instructions. Get help right away if:  You have redness or open areas around your nose or mouth where the mask fits.  You have trouble using the CPAP or BiPAP machine.  You cannot tolerate wearing the CPAP or BiPAP mask.  You have pain, discomfort, and bloating in your abdomen. Summary  CPAP and BiPAP are methods of helping a person breathe with the use of air pressure.  Both CPAP and BiPAP are provided by a small machine with a flexible plastic tube that attaches to a plastic mask.  If you have trouble with the mask, it is very important that you talk with your health care provider about finding a way to make the mask easier to tolerate. This information is not intended to replace advice given to you by your health care provider. Make sure you discuss any questions you have with your health care provider. Document Released: 01/08/2004 Document Revised: 02/29/2016 Document Reviewed: 02/29/2016 Elsevier Interactive Patient Education  2017 [employer].

## 2018-04-02 ENCOUNTER — Other Ambulatory Visit: Payer: Self-pay | Admitting: Oncology

## 2018-04-03 NOTE — Progress Notes (Signed)
Reviewed and agree with assessment/plan.   Franciscojavier Wronski, MD [facility] 04/20/2016, 12:24 PM Pager:  [phone]  

## 2018-04-06 ENCOUNTER — Other Ambulatory Visit (HOSPITAL_BASED_OUTPATIENT_CLINIC_OR_DEPARTMENT_OTHER): Payer: Medicare Other

## 2018-04-09 ENCOUNTER — Other Ambulatory Visit (HOSPITAL_BASED_OUTPATIENT_CLINIC_OR_DEPARTMENT_OTHER): Payer: Medicare Other

## 2018-04-13 ENCOUNTER — Ambulatory Visit (HOSPITAL_BASED_OUTPATIENT_CLINIC_OR_DEPARTMENT_OTHER): Payer: Medicare Other | Attending: Pulmonary Disease

## 2018-04-13 DIAGNOSIS — G4733 Obstructive sleep apnea (adult) (pediatric): Secondary | ICD-10-CM

## 2018-04-19 ENCOUNTER — Other Ambulatory Visit: Payer: BLUE CROSS/BLUE SHIELD

## 2018-04-19 ENCOUNTER — Inpatient Hospital Stay: Payer: Medicare Other
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
## 2018-04-23 ENCOUNTER — Other Ambulatory Visit: Payer: Self-pay

## 2018-04-23 ENCOUNTER — Inpatient Hospital Stay: Payer: Medicare Other

## 2018-04-23 ENCOUNTER — Telehealth: Payer: Self-pay | Admitting: Oncology

## 2018-04-23 ENCOUNTER — Inpatient Hospital Stay (HOSPITAL_BASED_OUTPATIENT_CLINIC_OR_DEPARTMENT_OTHER): Payer: Medicare Other | Admitting: Oncology

## 2018-04-23 ENCOUNTER — Inpatient Hospital Stay: Payer: Medicare Other | Attending: Oncology

## 2018-04-23 VITALS — BP 130/52 | HR 100 | Temp 98.2°F | Resp 18 | Ht 67.0 in | Wt 165.5 lb

## 2018-04-23 VITALS — BP 158/69 | HR 65 | Resp 18

## 2018-04-23 DIAGNOSIS — C61 Malignant neoplasm of prostate: Secondary | ICD-10-CM

## 2018-04-23 DIAGNOSIS — Z5112 Encounter for antineoplastic immunotherapy: Secondary | ICD-10-CM | POA: Diagnosis not present

## 2018-04-23 DIAGNOSIS — C8598 Non-Hodgkin lymphoma, unspecified, lymph nodes of multiple sites: Secondary | ICD-10-CM

## 2018-04-23 DIAGNOSIS — Z79899 Other long term (current) drug therapy: Secondary | ICD-10-CM | POA: Insufficient documentation

## 2018-04-23 DIAGNOSIS — N183 Chronic kidney disease, stage 3 unspecified: Secondary | ICD-10-CM

## 2018-04-23 DIAGNOSIS — Z95828 Presence of other vascular implants and grafts: Secondary | ICD-10-CM

## 2018-04-23 DIAGNOSIS — I25810 Atherosclerosis of coronary artery bypass graft(s) without angina pectoris: Secondary | ICD-10-CM

## 2018-04-23 DIAGNOSIS — Z87891 Personal history of nicotine dependence: Secondary | ICD-10-CM | POA: Insufficient documentation

## 2018-04-23 DIAGNOSIS — I129 Hypertensive chronic kidney disease with stage 1 through stage 4 chronic kidney disease, or unspecified chronic kidney disease: Secondary | ICD-10-CM | POA: Diagnosis not present

## 2018-04-23 DIAGNOSIS — C8590 Non-Hodgkin lymphoma, unspecified, unspecified site: Secondary | ICD-10-CM

## 2018-04-23 DIAGNOSIS — N189 Chronic kidney disease, unspecified: Secondary | ICD-10-CM

## 2018-04-23 DIAGNOSIS — C8219 Follicular lymphoma grade II, extranodal and solid organ sites: Secondary | ICD-10-CM

## 2018-04-23 DIAGNOSIS — C8203 Follicular lymphoma grade I, intra-abdominal lymph nodes: Secondary | ICD-10-CM

## 2018-04-23 DIAGNOSIS — C8580 Other specified types of non-Hodgkin lymphoma, unspecified site: Secondary | ICD-10-CM

## 2018-04-23 DIAGNOSIS — D631 Anemia in chronic kidney disease: Secondary | ICD-10-CM

## 2018-04-23 DIAGNOSIS — C8248 Follicular lymphoma grade IIIb, lymph nodes of multiple sites: Secondary | ICD-10-CM

## 2018-04-23 LAB — COMPREHENSIVE METABOLIC PANEL
ALT: 23 U/L (ref 0–44)
AST: 26 U/L (ref 15–41)
Albumin: 3.9 g/dL (ref 3.5–5.0)
Alkaline Phosphatase: 57 U/L (ref 38–126)
Anion gap: 9 (ref 5–15)
BUN: 23 mg/dL (ref 8–23)
CHLORIDE: 106 mmol/L (ref 98–111)
CO2: 24 mmol/L (ref 22–32)
Calcium: 9.5 mg/dL (ref 8.9–10.3)
Creatinine, Ser: 1.5 mg/dL — ABNORMAL HIGH (ref 0.61–1.24)
GFR calc Af Amer: 49 mL/min — ABNORMAL LOW (ref >60)
GFR calc non Af Amer: 42 mL/min — ABNORMAL LOW (ref >60)
Glucose, Bld: 145 mg/dL — ABNORMAL HIGH (ref 70–99)
Potassium: 4.2 mmol/L (ref 3.5–5.1)
Sodium: 139 mmol/L (ref 135–145)
Total Bilirubin: 0.4 mg/dL (ref 0.3–1.2)
Total Protein: 6.4 g/dL — ABNORMAL LOW (ref 6.5–8.1)

## 2018-04-23 LAB — CBC WITH DIFFERENTIAL/PLATELET
Abs Immature Granulocytes: 0.04 10*3/uL (ref 0.00–0.07)
Basophils Absolute: 0 10*3/uL (ref 0.0–0.1)
Basophils Relative: 0 %
Eosinophils Absolute: 0.2 10*3/uL (ref 0.0–0.5)
Eosinophils Relative: 5 %
HCT: 29.3 % — ABNORMAL LOW (ref 39.0–52.0)
Hemoglobin: 9.4 g/dL — ABNORMAL LOW (ref 13.0–17.0)
IMMATURE GRANULOCYTES: 1 %
LYMPHS PCT: 5 %
Lymphs Abs: 0.3 10*3/uL — ABNORMAL LOW (ref 0.7–4.0)
MCH: 27.2 pg (ref 26.0–34.0)
MCHC: 32.1 g/dL (ref 30.0–36.0)
MCV: 84.7 fL (ref 80.0–100.0)
Monocytes Absolute: 0.4 10*3/uL (ref 0.1–1.0)
Monocytes Relative: 7 %
NEUTROS PCT: 82 %
Neutro Abs: 3.9 10*3/uL (ref 1.7–7.7)
Platelets: 122 10*3/uL — ABNORMAL LOW (ref 150–400)
RBC: 3.46 MIL/uL — ABNORMAL LOW (ref 4.22–5.81)
RDW: 15.2 % (ref 11.5–15.5)
WBC: 4.8 10*3/uL (ref 4.0–10.5)
nRBC: 0 % (ref 0.0–0.2)

## 2018-04-23 LAB — LACTATE DEHYDROGENASE: LDH: 115 U/L (ref 98–192)

## 2018-04-23 MED ORDER — HEPARIN SOD (PORK) LOCK FLUSH 100 UNIT/ML IV SOLN
500.0000 [IU] | Freq: Once | INTRAVENOUS | Status: AC | PRN
  Administered 2018-04-23: 500 [IU]
  Filled 2018-04-23: qty 5

## 2018-04-23 MED ORDER — SODIUM CHLORIDE 0.9 % IV SOLN
350.0000 mg/m2 | Freq: Once | INTRAVENOUS | Status: AC
  Administered 2018-04-23: 700 mg via INTRAVENOUS
  Filled 2018-04-23: qty 50

## 2018-04-23 MED ORDER — PANCRELIPASE (LIP-PROT-AMYL) 12000-38000 UNITS PO CPEP: 12000.0000 [IU] | ORAL_CAPSULE | Freq: Three times a day (TID) | ORAL | 12 refills | Status: DC

## 2018-04-23 MED ORDER — ACETAMINOPHEN 325 MG PO TABS
ORAL_TABLET | ORAL | Status: AC
  Filled 2018-04-23: qty 2

## 2018-04-23 MED ORDER — SODIUM CHLORIDE 0.9% FLUSH
10.0000 mL | Freq: Once | INTRAVENOUS | Status: AC
  Administered 2018-04-23: 10 mL
  Filled 2018-04-23: qty 10

## 2018-04-23 MED ORDER — DIPHENHYDRAMINE HCL 25 MG PO CAPS
ORAL_CAPSULE | ORAL | Status: AC
  Filled 2018-04-23: qty 1

## 2018-04-23 MED ORDER — SODIUM CHLORIDE 0.9 % IJ SOLN
10.0000 mL | INTRAMUSCULAR | Status: DC | PRN
  Administered 2018-04-23: 10 mL
  Filled 2018-04-23: qty 10

## 2018-04-23 MED ORDER — ACETAMINOPHEN 325 MG PO TABS
650.0000 mg | ORAL_TABLET | Freq: Once | ORAL | Status: AC
  Administered 2018-04-23: 650 mg via ORAL

## 2018-04-23 MED ORDER — DIPHENHYDRAMINE HCL 25 MG PO CAPS
25.0000 mg | ORAL_CAPSULE | Freq: Once | ORAL | Status: AC
  Administered 2018-04-23: 25 mg via ORAL

## 2018-04-23 MED ORDER — SODIUM CHLORIDE 0.9 % IV SOLN
Freq: Once | INTRAVENOUS | Status: AC
  Administered 2018-04-23: 12:00:00 via INTRAVENOUS
  Filled 2018-04-23: qty 250

## 2018-04-23 NOTE — Telephone Encounter (Signed)
Gave avs and calendar ° °

## 2018-04-23 NOTE — Patient Instructions (Signed)
[facility] Discharge Instructions for Patients Receiving Chemotherapy  Today you received the following chemotherapy agents:  Rituxan   To help prevent nausea and vomiting after your treatment, we encourage you to take your nausea medication as prescribed.   If you develop nausea and vomiting that is not controlled by your nausea medication, call the clinic.   BELOW ARE SYMPTOMS THAT SHOULD BE REPORTED IMMEDIATELY:  *FEVER GREATER THAN 100.5 F  *CHILLS WITH OR WITHOUT FEVER  NAUSEA AND VOMITING THAT IS NOT CONTROLLED WITH YOUR NAUSEA MEDICATION  *UNUSUAL [name]  *UNUSUAL BRUISING OR BLEEDING  TENDERNESS IN MOUTH AND THROAT WITH OR WITHOUT PRESENCE OF ULCERS  *URINARY PROBLEMS  *BOWEL PROBLEMS  UNUSUAL RASH Items with * indicate a potential emergency and should be followed up as soon as possible.  Feel free to call the clinic should you have any questions or concerns. The clinic phone number is [phone].  Please show the CHEMO ALERT CARD at check-in to the Emergency Department and triage nurse.   

## 2018-04-26 ENCOUNTER — Ambulatory Visit (HOSPITAL_COMMUNITY)
Admission: RE | Admit: 2018-04-26 | Discharge: 2018-04-26 | Disposition: A | Discharge status: Home / Self Care | Payer: Medicare Other | Source: Ambulatory Visit | Attending: Oncology | Admitting: Oncology

## 2018-04-26 DIAGNOSIS — C8248 Follicular lymphoma grade IIIb, lymph nodes of multiple sites: Secondary | ICD-10-CM | POA: Diagnosis not present

## 2018-04-26 DIAGNOSIS — C8290 Follicular lymphoma, unspecified, unspecified site: Secondary | ICD-10-CM | POA: Diagnosis not present

## 2018-04-26 LAB — GLUCOSE, CAPILLARY: Glucose-Capillary: 106 mg/dL — ABNORMAL HIGH (ref 70–99)

## 2018-04-26 MED ORDER — FLUDEOXYGLUCOSE F - 18 (FDG) INJECTION
8.2000 | Freq: Once | INTRAVENOUS | Status: AC | PRN
  Administered 2018-04-26: 8.2 via INTRAVENOUS

## 2018-04-27 ENCOUNTER — Other Ambulatory Visit: Payer: Self-pay | Admitting: Oncology

## 2018-04-27 NOTE — Progress Notes (Signed)
I called the patient and discussed the good news of the PET scan with his daughter.

## 2018-05-23 DIAGNOSIS — D0439 Carcinoma in situ of skin of other parts of face: Secondary | ICD-10-CM | POA: Diagnosis not present

## 2018-05-23 DIAGNOSIS — D0421 Carcinoma in situ of skin of right ear and external auricular canal: Secondary | ICD-10-CM | POA: Diagnosis not present

## 2018-05-23 DIAGNOSIS — C44222 Squamous cell carcinoma of skin of right ear and external auricular canal: Secondary | ICD-10-CM | POA: Diagnosis not present

## 2018-05-23 DIAGNOSIS — Z85828 Personal history of other malignant neoplasm of skin: Secondary | ICD-10-CM | POA: Diagnosis not present

## 2018-05-23 DIAGNOSIS — L820 Inflamed seborrheic keratosis: Secondary | ICD-10-CM | POA: Diagnosis not present

## 2018-05-23 DIAGNOSIS — D485 Neoplasm of uncertain behavior of skin: Secondary | ICD-10-CM | POA: Diagnosis not present

## 2018-06-06 DIAGNOSIS — Z85828 Personal history of other malignant neoplasm of skin: Secondary | ICD-10-CM | POA: Diagnosis not present

## 2018-06-06 DIAGNOSIS — C44329 Squamous cell carcinoma of skin of other parts of face: Secondary | ICD-10-CM | POA: Diagnosis not present

## 2018-06-11 ENCOUNTER — Telehealth: Payer: Self-pay | Admitting: Oncology

## 2018-06-11 NOTE — Telephone Encounter (Signed)
Called patient per 2/17 sch message - left message for patient to call back for r/s

## 2018-06-13 DIAGNOSIS — C61 Malignant neoplasm of prostate: Secondary | ICD-10-CM | POA: Diagnosis not present

## 2018-06-13 DIAGNOSIS — N131 Hydronephrosis with ureteral stricture, not elsewhere classified: Secondary | ICD-10-CM | POA: Diagnosis not present

## 2018-06-13 DIAGNOSIS — R310 Gross hematuria: Secondary | ICD-10-CM | POA: Diagnosis not present

## 2018-06-15 ENCOUNTER — Inpatient Hospital Stay: Payer: Medicare Other

## 2018-06-15 ENCOUNTER — Inpatient Hospital Stay: Payer: Medicare Other | Attending: Oncology

## 2018-06-15 ENCOUNTER — Telehealth: Payer: Self-pay | Admitting: Oncology

## 2018-06-15 VITALS — BP 123/64 | HR 71 | Temp 97.7°F | Resp 16

## 2018-06-15 DIAGNOSIS — N183 Chronic kidney disease, stage 3 unspecified: Secondary | ICD-10-CM

## 2018-06-15 DIAGNOSIS — Z79899 Other long term (current) drug therapy: Secondary | ICD-10-CM | POA: Insufficient documentation

## 2018-06-15 DIAGNOSIS — C8580 Other specified types of non-Hodgkin lymphoma, unspecified site: Secondary | ICD-10-CM

## 2018-06-15 DIAGNOSIS — C61 Malignant neoplasm of prostate: Secondary | ICD-10-CM | POA: Diagnosis not present

## 2018-06-15 DIAGNOSIS — C8590 Non-Hodgkin lymphoma, unspecified, unspecified site: Secondary | ICD-10-CM

## 2018-06-15 DIAGNOSIS — Z95828 Presence of other vascular implants and grafts: Secondary | ICD-10-CM

## 2018-06-15 DIAGNOSIS — C8219 Follicular lymphoma grade II, extranodal and solid organ sites: Secondary | ICD-10-CM

## 2018-06-15 DIAGNOSIS — N189 Chronic kidney disease, unspecified: Secondary | ICD-10-CM | POA: Diagnosis not present

## 2018-06-15 DIAGNOSIS — D631 Anemia in chronic kidney disease: Secondary | ICD-10-CM

## 2018-06-15 DIAGNOSIS — C8203 Follicular lymphoma grade I, intra-abdominal lymph nodes: Secondary | ICD-10-CM

## 2018-06-15 DIAGNOSIS — Z5112 Encounter for antineoplastic immunotherapy: Secondary | ICD-10-CM | POA: Insufficient documentation

## 2018-06-15 DIAGNOSIS — C8598 Non-Hodgkin lymphoma, unspecified, lymph nodes of multiple sites: Secondary | ICD-10-CM | POA: Insufficient documentation

## 2018-06-15 LAB — CBC WITH DIFFERENTIAL/PLATELET
Abs Immature Granulocytes: 0.09 10*3/uL — ABNORMAL HIGH (ref 0.00–0.07)
Basophils Absolute: 0 10*3/uL (ref 0.0–0.1)
Basophils Relative: 0 %
Eosinophils Absolute: 0.2 10*3/uL (ref 0.0–0.5)
Eosinophils Relative: 4 %
HCT: 32.5 % — ABNORMAL LOW (ref 39.0–52.0)
Hemoglobin: 10.4 g/dL — ABNORMAL LOW (ref 13.0–17.0)
Immature Granulocytes: 2 %
Lymphocytes Relative: 5 %
Lymphs Abs: 0.3 10*3/uL — ABNORMAL LOW (ref 0.7–4.0)
MCH: 26.7 pg (ref 26.0–34.0)
MCHC: 32 g/dL (ref 30.0–36.0)
MCV: 83.5 fL (ref 80.0–100.0)
Monocytes Absolute: 0.5 10*3/uL (ref 0.1–1.0)
Monocytes Relative: 8 %
NEUTROS PCT: 81 %
NRBC: 0 % (ref 0.0–0.2)
Neutro Abs: 4.5 10*3/uL (ref 1.7–7.7)
Platelets: 162 10*3/uL (ref 150–400)
RBC: 3.89 MIL/uL — ABNORMAL LOW (ref 4.22–5.81)
RDW: 15 % (ref 11.5–15.5)
WBC: 5.6 10*3/uL (ref 4.0–10.5)

## 2018-06-15 LAB — COMPREHENSIVE METABOLIC PANEL
ALT: 18 U/L (ref 0–44)
AST: 24 U/L (ref 15–41)
Albumin: 4.1 g/dL (ref 3.5–5.0)
Alkaline Phosphatase: 66 U/L (ref 38–126)
Anion gap: 12 (ref 5–15)
BUN: 34 mg/dL — ABNORMAL HIGH (ref 8–23)
CO2: 23 mmol/L (ref 22–32)
Calcium: 9.9 mg/dL (ref 8.9–10.3)
Chloride: 101 mmol/L (ref 98–111)
Creatinine, Ser: 1.7 mg/dL — ABNORMAL HIGH (ref 0.61–1.24)
GFR calc non Af Amer: 36 mL/min — ABNORMAL LOW (ref >60)
GFR, EST AFRICAN AMERICAN: 42 mL/min — AB (ref >60)
Glucose, Bld: 133 mg/dL — ABNORMAL HIGH (ref 70–99)
Potassium: 4.9 mmol/L (ref 3.5–5.1)
Sodium: 136 mmol/L (ref 135–145)
Total Bilirubin: 0.5 mg/dL (ref 0.3–1.2)
Total Protein: 7.1 g/dL (ref 6.5–8.1)

## 2018-06-15 LAB — LACTATE DEHYDROGENASE: LDH: 144 U/L (ref 98–192)

## 2018-06-15 MED ORDER — SODIUM CHLORIDE 0.9 % IV SOLN
350.0000 mg/m2 | Freq: Once | INTRAVENOUS | Status: AC
  Administered 2018-06-15: 700 mg via INTRAVENOUS
  Filled 2018-06-15: qty 50

## 2018-06-15 MED ORDER — SODIUM CHLORIDE 0.9% FLUSH
10.0000 mL | INTRAVENOUS | Status: DC | PRN
  Administered 2018-06-15: 10 mL
  Filled 2018-06-15: qty 10

## 2018-06-15 MED ORDER — HEPARIN SOD (PORK) LOCK FLUSH 100 UNIT/ML IV SOLN
500.0000 [IU] | Freq: Once | INTRAVENOUS | Status: AC | PRN
  Administered 2018-06-15: 500 [IU]
  Filled 2018-06-15: qty 5

## 2018-06-15 MED ORDER — ACETAMINOPHEN 325 MG PO TABS
650.0000 mg | ORAL_TABLET | Freq: Once | ORAL | Status: AC
  Administered 2018-06-15: 650 mg via ORAL

## 2018-06-15 MED ORDER — DIPHENHYDRAMINE HCL 25 MG PO CAPS
ORAL_CAPSULE | ORAL | Status: AC
  Filled 2018-06-15: qty 1

## 2018-06-15 MED ORDER — SODIUM CHLORIDE 0.9% FLUSH
10.0000 mL | Freq: Once | INTRAVENOUS | Status: AC
  Administered 2018-06-15: 10 mL
  Filled 2018-06-15: qty 10

## 2018-06-15 MED ORDER — SODIUM CHLORIDE 0.9 % IV SOLN
Freq: Once | INTRAVENOUS | Status: AC
  Administered 2018-06-15: 11:00:00 via INTRAVENOUS
  Filled 2018-06-15: qty 250

## 2018-06-15 MED ORDER — DIPHENHYDRAMINE HCL 25 MG PO CAPS
25.0000 mg | ORAL_CAPSULE | Freq: Once | ORAL | Status: AC
  Administered 2018-06-15: 25 mg via ORAL

## 2018-06-15 MED ORDER — ACETAMINOPHEN 325 MG PO TABS
ORAL_TABLET | ORAL | Status: AC
  Filled 2018-06-15: qty 2

## 2018-06-15 MED ORDER — SODIUM CHLORIDE 0.9 % IJ SOLN: 10.0000 mL | INTRAMUSCULAR | Status: DC | PRN

## 2018-06-15 NOTE — Patient Instructions (Signed)
[facility] Discharge Instructions for Patients Receiving Chemotherapy  Today you received the following chemotherapy agents:  Rituxan   To help prevent nausea and vomiting after your treatment, we encourage you to take your nausea medication as prescribed.   If you develop nausea and vomiting that is not controlled by your nausea medication, call the clinic.   BELOW ARE SYMPTOMS THAT SHOULD BE REPORTED IMMEDIATELY:  *FEVER GREATER THAN 100.5 F  *CHILLS WITH OR WITHOUT FEVER  NAUSEA AND VOMITING THAT IS NOT CONTROLLED WITH YOUR NAUSEA MEDICATION  *UNUSUAL [name]  *UNUSUAL BRUISING OR BLEEDING  TENDERNESS IN MOUTH AND THROAT WITH OR WITHOUT PRESENCE OF ULCERS  *URINARY PROBLEMS  *BOWEL PROBLEMS  UNUSUAL RASH Items with * indicate a potential emergency and should be followed up as soon as possible.  Feel free to call the clinic should you have any questions or concerns. The clinic phone number is [phone].  Please show the CHEMO ALERT CARD at check-in to the Emergency Department and triage nurse.   

## 2018-06-15 NOTE — Telephone Encounter (Signed)
Per Val its ok to leave the next appointment as is I did give a new calendar for 4/9 per patient

## 2018-06-21 DIAGNOSIS — C44222 Squamous cell carcinoma of skin of right ear and external auricular canal: Secondary | ICD-10-CM | POA: Diagnosis not present

## 2018-06-21 DIAGNOSIS — Z85828 Personal history of other malignant neoplasm of skin: Secondary | ICD-10-CM | POA: Diagnosis not present

## 2018-06-28 ENCOUNTER — Other Ambulatory Visit: Payer: Self-pay | Admitting: Urology

## 2018-07-02 ENCOUNTER — Inpatient Hospital Stay (HOSPITAL_COMMUNITY)
Admission: EM | Admit: 2018-07-02 | Discharge: 2018-07-05 | DRG: 964 | Disposition: A | Discharge status: Rehabilitation Facility | Payer: Medicare Other | Attending: General Surgery | Admitting: General Surgery

## 2018-07-02 ENCOUNTER — Emergency Department (HOSPITAL_COMMUNITY): Payer: Medicare Other

## 2018-07-02 ENCOUNTER — Other Ambulatory Visit: Payer: Self-pay

## 2018-07-02 ENCOUNTER — Encounter (HOSPITAL_COMMUNITY): Payer: Self-pay | Admitting: Emergency Medicine

## 2018-07-02 DIAGNOSIS — S27321A Contusion of lung, unilateral, initial encounter: Secondary | ICD-10-CM | POA: Diagnosis present

## 2018-07-02 DIAGNOSIS — S199XXA Unspecified injury of neck, initial encounter: Secondary | ICD-10-CM | POA: Diagnosis not present

## 2018-07-02 DIAGNOSIS — S32391A Other fracture of right ilium, initial encounter for closed fracture: Secondary | ICD-10-CM | POA: Diagnosis not present

## 2018-07-02 DIAGNOSIS — M109 Gout, unspecified: Secondary | ICD-10-CM | POA: Diagnosis present

## 2018-07-02 DIAGNOSIS — S2231XA Fracture of one rib, right side, initial encounter for closed fracture: Secondary | ICD-10-CM | POA: Diagnosis not present

## 2018-07-02 DIAGNOSIS — J942 Hemothorax: Secondary | ICD-10-CM | POA: Diagnosis not present

## 2018-07-02 DIAGNOSIS — D62 Acute posthemorrhagic anemia: Secondary | ICD-10-CM | POA: Diagnosis not present

## 2018-07-02 DIAGNOSIS — S8991XA Unspecified injury of right lower leg, initial encounter: Secondary | ICD-10-CM | POA: Diagnosis not present

## 2018-07-02 DIAGNOSIS — C799 Secondary malignant neoplasm of unspecified site: Secondary | ICD-10-CM | POA: Diagnosis present

## 2018-07-02 DIAGNOSIS — R739 Hyperglycemia, unspecified: Secondary | ICD-10-CM

## 2018-07-02 DIAGNOSIS — E785 Hyperlipidemia, unspecified: Secondary | ICD-10-CM | POA: Diagnosis present

## 2018-07-02 DIAGNOSIS — R202 Paresthesia of skin: Secondary | ICD-10-CM | POA: Diagnosis present

## 2018-07-02 DIAGNOSIS — M25561 Pain in right knee: Secondary | ICD-10-CM | POA: Diagnosis not present

## 2018-07-02 DIAGNOSIS — Z888 Allergy status to other drugs, medicaments and biological substances status: Secondary | ICD-10-CM

## 2018-07-02 DIAGNOSIS — S329XXA Fracture of unspecified parts of lumbosacral spine and pelvis, initial encounter for closed fracture: Secondary | ICD-10-CM

## 2018-07-02 DIAGNOSIS — S2249XA Multiple fractures of ribs, unspecified side, initial encounter for closed fracture: Secondary | ICD-10-CM | POA: Diagnosis present

## 2018-07-02 DIAGNOSIS — Z79899 Other long term (current) drug therapy: Secondary | ICD-10-CM

## 2018-07-02 DIAGNOSIS — G4733 Obstructive sleep apnea (adult) (pediatric): Secondary | ICD-10-CM | POA: Diagnosis present

## 2018-07-02 DIAGNOSIS — Z9049 Acquired absence of other specified parts of digestive tract: Secondary | ICD-10-CM

## 2018-07-02 DIAGNOSIS — S27329A Contusion of lung, unspecified, initial encounter: Secondary | ICD-10-CM | POA: Diagnosis not present

## 2018-07-02 DIAGNOSIS — S3991XA Unspecified injury of abdomen, initial encounter: Secondary | ICD-10-CM | POA: Diagnosis not present

## 2018-07-02 DIAGNOSIS — N183 Chronic kidney disease, stage 3 (moderate): Secondary | ICD-10-CM | POA: Diagnosis present

## 2018-07-02 DIAGNOSIS — C8590 Non-Hodgkin lymphoma, unspecified, unspecified site: Secondary | ICD-10-CM | POA: Diagnosis present

## 2018-07-02 DIAGNOSIS — S40812A Abrasion of left upper arm, initial encounter: Secondary | ICD-10-CM | POA: Diagnosis present

## 2018-07-02 DIAGNOSIS — Z7901 Long term (current) use of anticoagulants: Secondary | ICD-10-CM

## 2018-07-02 DIAGNOSIS — S3993XA Unspecified injury of pelvis, initial encounter: Secondary | ICD-10-CM | POA: Diagnosis not present

## 2018-07-02 DIAGNOSIS — S271XXA Traumatic hemothorax, initial encounter: Secondary | ICD-10-CM | POA: Diagnosis not present

## 2018-07-02 DIAGNOSIS — I959 Hypotension, unspecified: Secondary | ICD-10-CM | POA: Diagnosis not present

## 2018-07-02 DIAGNOSIS — S3992XA Unspecified injury of lower back, initial encounter: Secondary | ICD-10-CM | POA: Diagnosis not present

## 2018-07-02 DIAGNOSIS — S32301A Unspecified fracture of right ilium, initial encounter for closed fracture: Secondary | ICD-10-CM | POA: Diagnosis not present

## 2018-07-02 DIAGNOSIS — S80211A Abrasion, right knee, initial encounter: Secondary | ICD-10-CM | POA: Diagnosis present

## 2018-07-02 DIAGNOSIS — S2241XA Multiple fractures of ribs, right side, initial encounter for closed fracture: Principal | ICD-10-CM | POA: Diagnosis present

## 2018-07-02 DIAGNOSIS — I2510 Atherosclerotic heart disease of native coronary artery without angina pectoris: Secondary | ICD-10-CM | POA: Diagnosis present

## 2018-07-02 DIAGNOSIS — S299XXA Unspecified injury of thorax, initial encounter: Secondary | ICD-10-CM | POA: Diagnosis not present

## 2018-07-02 DIAGNOSIS — M79632 Pain in left forearm: Secondary | ICD-10-CM | POA: Diagnosis not present

## 2018-07-02 DIAGNOSIS — Z794 Long term (current) use of insulin: Secondary | ICD-10-CM

## 2018-07-02 DIAGNOSIS — Z87891 Personal history of nicotine dependence: Secondary | ICD-10-CM

## 2018-07-02 DIAGNOSIS — R310 Gross hematuria: Secondary | ICD-10-CM | POA: Diagnosis present

## 2018-07-02 DIAGNOSIS — E1122 Type 2 diabetes mellitus with diabetic chronic kidney disease: Secondary | ICD-10-CM | POA: Diagnosis present

## 2018-07-02 DIAGNOSIS — S2239XA Fracture of one rib, unspecified side, initial encounter for closed fracture: Secondary | ICD-10-CM | POA: Diagnosis present

## 2018-07-02 DIAGNOSIS — I480 Paroxysmal atrial fibrillation: Secondary | ICD-10-CM

## 2018-07-02 DIAGNOSIS — Z87898 Personal history of other specified conditions: Secondary | ICD-10-CM

## 2018-07-02 DIAGNOSIS — R0902 Hypoxemia: Secondary | ICD-10-CM | POA: Diagnosis not present

## 2018-07-02 DIAGNOSIS — T07XXXA Unspecified multiple injuries, initial encounter: Secondary | ICD-10-CM

## 2018-07-02 DIAGNOSIS — R58 Hemorrhage, not elsewhere classified: Secondary | ICD-10-CM | POA: Diagnosis not present

## 2018-07-02 DIAGNOSIS — B952 Enterococcus as the cause of diseases classified elsewhere: Secondary | ICD-10-CM | POA: Diagnosis present

## 2018-07-02 DIAGNOSIS — I951 Orthostatic hypotension: Secondary | ICD-10-CM | POA: Diagnosis present

## 2018-07-02 DIAGNOSIS — S40811A Abrasion of right upper arm, initial encounter: Secondary | ICD-10-CM | POA: Diagnosis present

## 2018-07-02 DIAGNOSIS — S59912A Unspecified injury of left forearm, initial encounter: Secondary | ICD-10-CM | POA: Diagnosis not present

## 2018-07-02 DIAGNOSIS — N390 Urinary tract infection, site not specified: Secondary | ICD-10-CM | POA: Diagnosis not present

## 2018-07-02 DIAGNOSIS — R52 Pain, unspecified: Secondary | ICD-10-CM

## 2018-07-02 DIAGNOSIS — C61 Malignant neoplasm of prostate: Secondary | ICD-10-CM | POA: Diagnosis present

## 2018-07-02 DIAGNOSIS — S0990XA Unspecified injury of head, initial encounter: Secondary | ICD-10-CM | POA: Diagnosis not present

## 2018-07-02 HISTORY — DX: Presence of urogenital implants: Z96.0

## 2018-07-02 HISTORY — DX: Obstructive sleep apnea (adult) (pediatric): G47.33

## 2018-07-02 HISTORY — DX: Calculus of kidney: N20.0

## 2018-07-02 HISTORY — DX: Bacteremia: R78.81

## 2018-07-02 HISTORY — DX: Polyneuropathy, unspecified: G62.9

## 2018-07-02 HISTORY — DX: Syncope and collapse: R55

## 2018-07-02 HISTORY — DX: Cellulitis, unspecified: L03.90

## 2018-07-02 HISTORY — DX: Type 2 diabetes mellitus without complications: E11.9

## 2018-07-02 HISTORY — DX: Atherosclerotic heart disease of native coronary artery without angina pectoris: I25.10

## 2018-07-02 HISTORY — DX: Chronic kidney disease, unspecified: N18.9

## 2018-07-02 HISTORY — DX: Contact with unspecified agricultural machinery, initial encounter: W30.9XXA

## 2018-07-02 LAB — PREPARE FRESH FROZEN PLASMA
UNIT DIVISION: 0
Unit division: 0

## 2018-07-02 LAB — POCT I-STAT EG7
ACID-BASE DEFICIT: 1 mmol/L (ref 0.0–2.0)
Bicarbonate: 22.7 mmol/L (ref 20.0–28.0)
Calcium, Ion: 1.13 mmol/L — ABNORMAL LOW (ref 1.15–1.40)
HCT: 27 % — ABNORMAL LOW (ref 39.0–52.0)
Hemoglobin: 9.2 g/dL — ABNORMAL LOW (ref 13.0–17.0)
O2 Saturation: 81 %
PH VEN: 7.463 — AB (ref 7.250–7.430)
Potassium: 3.5 mmol/L (ref 3.5–5.1)
Sodium: 139 mmol/L (ref 135–145)
TCO2: 24 mmol/L (ref 22–32)
pCO2, Ven: 31.7 mmHg — ABNORMAL LOW (ref 44.0–60.0)
pO2, Ven: 42 mmHg (ref 32.0–45.0)

## 2018-07-02 LAB — COMPREHENSIVE METABOLIC PANEL
ALT: 24 U/L (ref 0–44)
AST: 37 U/L (ref 15–41)
Albumin: 3.4 g/dL — ABNORMAL LOW (ref 3.5–5.0)
Alkaline Phosphatase: 48 U/L (ref 38–126)
Anion gap: 8 (ref 5–15)
BUN: 35 mg/dL — ABNORMAL HIGH (ref 8–23)
CO2: 23 mmol/L (ref 22–32)
Calcium: 8.9 mg/dL (ref 8.9–10.3)
Chloride: 106 mmol/L (ref 98–111)
Creatinine, Ser: 1.78 mg/dL — ABNORMAL HIGH (ref 0.61–1.24)
GFR, EST AFRICAN AMERICAN: 40 mL/min — AB (ref >60)
GFR, EST NON AFRICAN AMERICAN: 34 mL/min — AB (ref >60)
Glucose, Bld: 204 mg/dL — ABNORMAL HIGH (ref 70–99)
Potassium: 3.4 mmol/L — ABNORMAL LOW (ref 3.5–5.1)
Sodium: 137 mmol/L (ref 135–145)
Total Bilirubin: 0.4 mg/dL (ref 0.3–1.2)
Total Protein: 5.5 g/dL — ABNORMAL LOW (ref 6.5–8.1)

## 2018-07-02 LAB — CBC
HEMATOCRIT: 28.3 % — AB (ref 39.0–52.0)
Hemoglobin: 8.7 g/dL — ABNORMAL LOW (ref 13.0–17.0)
MCH: 26.3 pg (ref 26.0–34.0)
MCHC: 30.7 g/dL (ref 30.0–36.0)
MCV: 85.5 fL (ref 80.0–100.0)
Platelets: 144 10*3/uL — ABNORMAL LOW (ref 150–400)
RBC: 3.31 MIL/uL — ABNORMAL LOW (ref 4.22–5.81)
RDW: 15.4 % (ref 11.5–15.5)
WBC: 5.9 10*3/uL (ref 4.0–10.5)
nRBC: 0 % (ref 0.0–0.2)

## 2018-07-02 LAB — BPAM FFP
Blood Product Expiration Date: 202003272359
Blood Product Expiration Date: 202003302359
ISSUE DATE / TIME: 202003091650
ISSUE DATE / TIME: 202003091650
Unit Type and Rh: 6200
Unit Type and Rh: 6200

## 2018-07-02 LAB — PROTIME-INR
INR: 2.6 — ABNORMAL HIGH (ref 0.8–1.2)
Prothrombin Time: 27.5 seconds — ABNORMAL HIGH (ref 11.4–15.2)

## 2018-07-02 LAB — ETHANOL: Alcohol, Ethyl (B): <10 mg/dL (ref <10)

## 2018-07-02 LAB — LACTIC ACID, PLASMA: Lactic Acid, Venous: 1.5 mmol/L (ref 0.5–1.9)

## 2018-07-02 LAB — ABO/RH: ABO/RH(D): A POS

## 2018-07-02 LAB — I-STAT CREATININE, ED: Creatinine, Ser: 1.8 mg/dL — ABNORMAL HIGH (ref 0.61–1.24)

## 2018-07-02 MED ORDER — ONDANSETRON 4 MG PO TBDP: 4.0000 mg | ORAL_TABLET | Freq: Four times a day (QID) | ORAL | Status: DC | PRN

## 2018-07-02 MED ORDER — MORPHINE SULFATE (PF) 2 MG/ML IV SOLN
2.0000 mg | INTRAVENOUS | Status: DC | PRN
  Administered 2018-07-02: 1 mg via INTRAVENOUS
  Administered 2018-07-03: 2 mg via INTRAVENOUS
  Filled 2018-07-02 (×2): qty 1

## 2018-07-02 MED ORDER — IOHEXOL 300 MG/ML  SOLN
100.0000 mL | Freq: Once | INTRAMUSCULAR | Status: AC | PRN
  Administered 2018-07-02: 100 mL via INTRAVENOUS

## 2018-07-02 MED ORDER — HYDRALAZINE HCL 20 MG/ML IJ SOLN: 10.0000 mg | INTRAMUSCULAR | Status: DC | PRN

## 2018-07-02 MED ORDER — ACETAMINOPHEN 325 MG PO TABS
650.0000 mg | ORAL_TABLET | ORAL | Status: DC | PRN
  Administered 2018-07-03 – 2018-07-05 (×5): 650 mg via ORAL
  Filled 2018-07-02 (×7): qty 2

## 2018-07-02 MED ORDER — ONDANSETRON HCL 4 MG/2ML IJ SOLN: 4.0000 mg | Freq: Four times a day (QID) | INTRAMUSCULAR | Status: DC | PRN

## 2018-07-02 MED ORDER — FENTANYL CITRATE (PF) 100 MCG/2ML IJ SOLN
50.0000 ug | Freq: Once | INTRAMUSCULAR | Status: AC
  Administered 2018-07-02: 50 ug via INTRAVENOUS
  Filled 2018-07-02: qty 2

## 2018-07-02 MED ORDER — DOCUSATE SODIUM 100 MG PO CAPS
100.0000 mg | ORAL_CAPSULE | Freq: Two times a day (BID) | ORAL | Status: DC
  Administered 2018-07-03 – 2018-07-05 (×6): 100 mg via ORAL
  Filled 2018-07-02 (×6): qty 1

## 2018-07-02 MED ORDER — OXYCODONE HCL 5 MG PO TABS
5.0000 mg | ORAL_TABLET | ORAL | Status: DC | PRN
  Administered 2018-07-03 – 2018-07-05 (×6): 5 mg via ORAL
  Filled 2018-07-02 (×6): qty 1

## 2018-07-02 MED ORDER — DEXTROSE-NACL 5-0.45 % IV SOLN
INTRAVENOUS | Status: DC
  Administered 2018-07-02 – 2018-07-04 (×3): via INTRAVENOUS
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
## 2018-07-02 NOTE — ED Notes (Signed)
Family at beside. Family given emotional support. 

## 2018-07-02 NOTE — H&P (Signed)
Activation and Reason: level I downgraded to level II, run over by tractor  Primary Survey: airway intact, breath sounds present bilaterally, distal pulses intact  [name] is an 85 y.o. male.  HPI: 85 yo male was out doing work when his tractor got loose and ran over him. He denies loss of consciousness. He complains of pain in his right side. He had this happen once last year.  Past Medical History:  Diagnosis Date  . A-fib ([place])   . Lymphoma ([place])    Non-hodgkins  . Ureteral stent retained     History reviewed. No pertinent surgical history.  No family history on file.  Social History:  reports that he has never smoked. He has never used smokeless tobacco. He reports that he does not drink alcohol or use drugs.  Allergies:  Allergies  Allergen Reactions  . Atenolol Other (See Comments)    "Heart rate slowed- STOPPED on 08/16/2013"  . Niacin And Related Other (See Comments)    Headache     Medications: I have reviewed the patient's current medications.  Results for orders placed or performed during the hospital encounter of 07/02/18 (from the past 48 hour(s))  Prepare fresh frozen plasma     Status: None   Collection Time: 07/02/18  4:40 PM  Result Value Ref Range   Unit Number [id number]    Blood Component Type LIQ PLASMA    Unit division 00    Status of Unit REL FROM [facility]    Unit tag comment EMERGENCY RELEASE    Transfusion Status      OK TO TRANSFUSE Performed at [facility] [address], [place], [place] [address]    Unit Number [id number]    Blood Component Type LIQ PLASMA    Unit division 00    Status of Unit REL FROM [facility]    Unit tag comment EMERGENCY RELEASE    Transfusion Status OK TO TRANSFUSE   Type and screen Ordered by PROVIDER DEFAULT     Status: None   Collection Time: 07/02/18  4:55 PM  Result Value Ref Range   ABO/RH(D) A POS    Antibody Screen NEG    Sample Expiration      07/05/2018 Performed at [facility] [address] [place], [place] [address]    Unit Number [id number]    Blood Component Type RED CELLS,LR    Unit division 00    Status of Unit REL FROM [facility]    Unit tag comment EMERGENCY RELEASE    Transfusion Status OK TO TRANSFUSE    Crossmatch Result NOT NEEDED    Unit Number [id number]    Blood Component Type RED CELLS,LR    Unit division 00    Status of Unit REL FROM [facility]    Unit tag comment EMERGENCY RELEASE    Transfusion Status OK TO TRANSFUSE    Crossmatch Result NOT NEEDED   ABO/Rh     Status: None   Collection Time: 07/02/18  4:55 PM  Result Value Ref Range   ABO/RH(D)      A POS Performed at [facility], [place] [address] [place], [place] [address]   Comprehensive metabolic panel     Status: Abnormal   Collection Time: 07/02/18  4:59 PM  Result Value Ref Range   Sodium 137 135 - 145 mmol/L   Potassium 3.4 (L) 3.5 - 5.1 mmol/L   Chloride 106 98 - 111 mmol/L  CO2 23 22 - 32 mmol/L   Glucose, Bld 204 (H) 70 - 99 mg/dL   BUN 35 (H) 8 - 23 mg/dL   Creatinine, Ser 1.78 (H) 0.61 - 1.24 mg/dL   Calcium 8.9 8.9 - 10.3 mg/dL   Total Protein 5.5 (L) 6.5 - 8.1 g/dL   Albumin 3.4 (L) 3.5 - 5.0 g/dL   AST 37 15 - 41 U/L   ALT 24 0 - 44 U/L   Alkaline Phosphatase 48 38 - 126 U/L   Total Bilirubin 0.4 0.3 - 1.2 mg/dL   GFR calc non Af Amer 34 (L) >60 mL/min   GFR calc Af Amer 40 (L) >60 mL/min   Anion gap 8 5 - 15    Comment: Performed at [facility] [address] [place], [place] [address]  CBC     Status: Abnormal   Collection Time: 07/02/18  4:59 PM  Result Value Ref Range   WBC 5.9 4.0 - 10.5 K/uL   RBC 3.31 (L) 4.22 - 5.81 MIL/uL   Hemoglobin 8.7 (L) 13.0 - 17.0 g/dL   HCT 28.3 (L) 39.0 - 52.0 %   MCV 85.5 80.0 - 100.0 fL   MCH 26.3 26.0 - 34.0 pg   MCHC 30.7 30.0 - 36.0 g/dL   RDW 15.4 11.5 - 15.5 %   Platelets 144 (L) 150 - 400 K/uL   nRBC 0.0 0.0 - 0.2 %    Comment: Performed at [facility], [place] [address],  [place], [place] [address]  Ethanol     Status: None   Collection Time: 07/02/18  4:59 PM  Result Value Ref Range   Alcohol, Ethyl (B) <10 <10 mg/dL    Comment: (NOTE) Lowest detectable limit for serum alcohol is 10 mg/dL. For medical purposes only. Performed at [facility], [place] [address], [place], [place] [address]   Lactic acid, plasma     Status: None   Collection Time: 07/02/18  4:59 PM  Result Value Ref Range   Lactic Acid, Venous 1.5 0.5 - 1.9 mmol/L    Comment: Performed at [facility] [address], [place], [place] [address]  Protime-INR     Status: Abnormal   Collection Time: 07/02/18  4:59 PM  Result Value Ref Range   Prothrombin Time 27.5 (H) 11.4 - 15.2 seconds   INR 2.6 (H) 0.8 - 1.2    Comment: (NOTE) INR goal varies based on device and disease states. Performed at [facility], [place] [address], [place], [place] [address]   POCT I-Stat EG7     Status: Abnormal   Collection Time: 07/02/18  5:01 PM  Result Value Ref Range   pH, Ven 7.463 (H) 7.250 - 7.430   pCO2, Ven 31.7 (L) 44.0 - 60.0 mmHg   pO2, Ven 42.0 32.0 - 45.0 mmHg   Bicarbonate 22.7 20.0 - 28.0 mmol/L   TCO2 24 22 - 32 mmol/L   O2 Saturation 81.0 %   Acid-base deficit 1.0 0.0 - 2.0 mmol/L   Sodium 139 135 - 145 mmol/L   Potassium 3.5 3.5 - 5.1 mmol/L   Calcium, Ion 1.13 (L) 1.15 - 1.40 mmol/L   HCT 27.0 (L) 39.0 - 52.0 %   Hemoglobin 9.2 (L) 13.0 - 17.0 g/dL   Patient temperature HIDE    Sample type VENOUS   I-stat Creatinine, ED     Status: Abnormal   Collection Time: 07/02/18  5:01 PM  Result Value Ref Range   Creatinine, Ser 1.80 (H) 0.61 - 1.24 mg/dL    Dg Forearm Left  Result Date: 07/02/2018 CLINICAL DATA:  Run over by tractor.  Pain. EXAM: LEFT FOREARM - 2 VIEW COMPARISON:  None. FINDINGS: No evidence of fracture or dislocation. Regional arterial calcification incidentally noted. IMPRESSION: Negative. Electronically Signed   By: Nelson Chimes M.D.   On: 07/02/2018 18:59     Dg Forearm Right  Result Date: 07/02/2018 CLINICAL DATA:  Run over by tractor.  Pain. EXAM: RIGHT FOREARM - 2 VIEW COMPARISON:  None. FINDINGS: There is no evidence of fracture or other focal bone lesions. Soft tissues are unremarkable. Incidental age related arterial calcification. IMPRESSION: Negative. Electronically Signed   By: Nelson Chimes M.D.   On: 07/02/2018 19:00   Ct Head Wo Contrast  Result Date: 07/02/2018 CLINICAL DATA:  85 year old male run over by tractor. EXAM: CT HEAD WITHOUT CONTRAST CT CERVICAL SPINE WITHOUT CONTRAST TECHNIQUE: Multidetector CT imaging of the head and cervical spine was performed following the standard protocol without intravenous contrast. Multiplanar CT image reconstructions of the cervical spine were also generated. COMPARISON:  None available, a duplicate account in PACS is being investigated and merge at this time. FINDINGS: CT HEAD FINDINGS Brain: Cerebral volume loss appears generalized. Mild to moderate for age patchy white matter hypodensity. No midline shift, ventriculomegaly, mass effect, evidence of mass lesion, intracranial hemorrhage or evidence of cortically based acute infarction. No cortical encephalomalacia. Vascular: Extensive calcified atherosclerosis at the skull base. Mild intracranial artery tortuosity. Skull: Intact. Sinuses/Orbits: Paranasal sinuses and mastoids are well pneumatized. Other: No definite acute orbit or scalp soft tissue finding. CT CERVICAL SPINE FINDINGS Alignment: Straightening of cervical lordosis. Cervicothoracic junction alignment is within normal limits. Bilateral posterior element alignment is within normal limits. Skull base and vertebrae: Visualized skull base is intact. No atlanto-occipital dissociation. Osteopenia. No acute osseous abnormality identified. Soft tissues and spinal canal: No prevertebral fluid or swelling. No visible canal hematoma. Calcified cervical carotid atherosclerosis. Otherwise negative noncontrast  neck soft tissues. Disc levels: Widespread cervical spine degeneration. Up to mild degenerative spinal stenosis at C4-C5. Upper chest: Grossly intact visible upper thoracic levels. Negative lung apices. IMPRESSION: 1. No acute traumatic injury identified in the head or cervical spine. 2. No acute intracranial abnormality. 3. Cervical spine degeneration. Extensive calcified atherosclerosis. Electronically Signed   By: Genevie Ann M.D.   On: 07/02/2018 17:55   Ct Chest W Contrast  Result Date: 07/02/2018 CLINICAL DATA:  85 year old male run over by tractor. Follicular lymphoma. EXAM: CT CHEST, ABDOMEN, AND PELVIS WITH CONTRAST TECHNIQUE: Multidetector CT imaging of the chest, abdomen and pelvis was performed following the standard protocol during bolus administration of intravenous contrast. CONTRAST:  170mL OMNIPAQUE IOHEXOL 300 MG/ML  SOLN COMPARISON:  PET-CT 04/26/2018. Chest abdomen and pelvis CT 09/27/2017. FINDINGS: CT CHEST FINDINGS Cardiovascular: Intact thoracic aorta. Calcified coronary artery and Calcified aortic atherosclerosis. Mild cardiomegaly appears stable. No pericardial effusion. Stable right chest porta cath. Mediastinum/Nodes: Negative for mediastinal lymphadenopathy or hematoma. Lungs/Pleura: Major airways are patent. Dependent atelectasis in both lungs similar to that in 2019. Trace right pleural effusion/hemothorax. No right pneumothorax. Possible small lateral segment right middle lobe pulmonary contusion on series 4, image 103. Right lateral costophrenic angle atelectasis appears stable. No contralateral left pneumothorax, pleural effusion or pulmonary contusion. Musculoskeletal: Acute mildly displaced fractures of the right anterolateral 4th and lateral 7 ribs. A nondisplaced fracture of the right lateral 6th rib appears chronic. Nondisplaced fracture of  the right lateral 5th rib is age indeterminate. There is a new nondisplaced fracture of the right lateral 9th rib. Stable chronic left  anterior 3rd rib nondisplaced fracture. No acute left rib fracture identified. Sternum appears intact. Thoracic vertebrae appear stable. Visible shoulder osseous structures appear stable. Superficial right lateral chest wall stranding compatible with contusion or mild hematoma on series 3, image 41 overlying the 6th rib. There is mild intercostal space hematoma right anterolateral 3/4 interspace on series 3, image 26. CT ABDOMEN PELVIS FINDINGS Hepatobiliary: Stable coarsely calcified left hepatic mass, 7.7 centimeters. Stable liver parenchyma elsewhere with occasional subcentimeter low-density areas. Chronic cholecystectomy. Pancreas: Stable pancreatic atrophy. Spleen: Stable splenic heterogeneity with multiple predominantly subcentimeter hypodense areas. Adrenals/Urinary Tract: Negative adrenal glands. Chronic bilateral ureteral stents are in place with configuration not significantly changed since June 2019. Bilateral renal enhancement and contrast excretion appears symmetric and within normal limits. Stable renal parenchyma with cortical scarring. Mildly distended but otherwise unremarkable urinary bladder. Stomach/Bowel: Negative rectum. Sigmoid tortuosity and diverticulosis. Similar extensive diverticulosis in the descending and transverse colon. Negative right colon and appendix. No dilated small bowel. Negative stomach. No free air or free fluid. Vascular/Lymphatic: Aortoiliac calcified atherosclerosis. Major arterial structures appear patent and intact. Portal venous system is patent. Reproductive: Stable, negative. Other: No pelvic free fluid. Musculoskeletal: Lumbar spine appears stable including lower lumbar spondylolisthesis and degenerative changes. Sacrum and SI joints appear intact. Subtle nondisplaced fracture through the posteromedial right iliac bone on series 3, image 97 and coronal image 128. The nearby right SI joint appears stable. No other acute pelvic fracture identified. No proximal femur  fracture identified. No superficial soft tissue injury identified. IMPRESSION: 1. Acute non- to mildly displaced fractures of the right 4th, 7th, and 9th ribs 2. Trace right hemothorax, small right intercostal hematoma, and possible small lateral segment right middle lobe pulmonary contusion. 3. Acute nondisplaced fracture of the posteromedial right iliac bone. The nearby right SI joint appears stable. No other pelvic fracture identified. 4. No other acute traumatic injury identified. CT thoracic and lumbar spine are reported separately. 5. Stable chronic findings in the chest abdomen and pelvis including: Calcified left hepatic mass, small splenic lesions, bilateral ureteral stents, diverticulosis of the colon, Aortic Atherosclerosis (ICD10-I70.0). Electronically Signed   By: Genevie Ann M.D.   On: 07/02/2018 18:28   Ct Cervical Spine Wo Contrast  Result Date: 07/02/2018 CLINICAL DATA:  85 year old male run over by tractor. EXAM: CT HEAD WITHOUT CONTRAST CT CERVICAL SPINE WITHOUT CONTRAST TECHNIQUE: Multidetector CT imaging of the head and cervical spine was performed following the standard protocol without intravenous contrast. Multiplanar CT image reconstructions of the cervical spine were also generated. COMPARISON:  None available, a duplicate account in PACS is being investigated and merge at this time. FINDINGS: CT HEAD FINDINGS Brain: Cerebral volume loss appears generalized. Mild to moderate for age patchy white matter hypodensity. No midline shift, ventriculomegaly, mass effect, evidence of mass lesion, intracranial hemorrhage or evidence of cortically based acute infarction. No cortical encephalomalacia. Vascular: Extensive calcified atherosclerosis at the skull base. Mild intracranial artery tortuosity. Skull: Intact. Sinuses/Orbits: Paranasal sinuses and mastoids are well pneumatized. Other: No definite acute orbit or scalp soft tissue finding. CT CERVICAL SPINE FINDINGS Alignment: Straightening of  cervical lordosis. Cervicothoracic junction alignment is within normal limits. Bilateral posterior element alignment is within normal limits. Skull base and vertebrae: Visualized skull base is intact. No atlanto-occipital dissociation. Osteopenia. No acute osseous abnormality identified. Soft tissues and spinal canal: No prevertebral fluid or  swelling. No visible canal hematoma. Calcified cervical carotid atherosclerosis. Otherwise negative noncontrast neck soft tissues. Disc levels: Widespread cervical spine degeneration. Up to mild degenerative spinal stenosis at C4-C5. Upper chest: Grossly intact visible upper thoracic levels. Negative lung apices. IMPRESSION: 1. No acute traumatic injury identified in the head or cervical spine. 2. No acute intracranial abnormality. 3. Cervical spine degeneration. Extensive calcified atherosclerosis. Electronically Signed   By: Genevie Ann M.D.   On: 07/02/2018 17:55   Ct Abdomen Pelvis W Contrast  Result Date: 07/02/2018 CLINICAL DATA:  85 year old male run over by tractor. Follicular lymphoma. EXAM: CT CHEST, ABDOMEN, AND PELVIS WITH CONTRAST TECHNIQUE: Multidetector CT imaging of the chest, abdomen and pelvis was performed following the standard protocol during bolus administration of intravenous contrast. CONTRAST:  137mL OMNIPAQUE IOHEXOL 300 MG/ML  SOLN COMPARISON:  PET-CT 04/26/2018. Chest abdomen and pelvis CT 09/27/2017. FINDINGS: CT CHEST FINDINGS Cardiovascular: Intact thoracic aorta. Calcified coronary artery and Calcified aortic atherosclerosis. Mild cardiomegaly appears stable. No pericardial effusion. Stable right chest porta cath. Mediastinum/Nodes: Negative for mediastinal lymphadenopathy or hematoma. Lungs/Pleura: Major airways are patent. Dependent atelectasis in both lungs similar to that in 2019. Trace right pleural effusion/hemothorax. No right pneumothorax. Possible small lateral segment right middle lobe pulmonary contusion on series 4, image 103. Right  lateral costophrenic angle atelectasis appears stable. No contralateral left pneumothorax, pleural effusion or pulmonary contusion. Musculoskeletal: Acute mildly displaced fractures of the right anterolateral 4th and lateral 7 ribs. A nondisplaced fracture of the right lateral 6th rib appears chronic. Nondisplaced fracture of the right lateral 5th rib is age indeterminate. There is a new nondisplaced fracture of the right lateral 9th rib. Stable chronic left anterior 3rd rib nondisplaced fracture. No acute left rib fracture identified. Sternum appears intact. Thoracic vertebrae appear stable. Visible shoulder osseous structures appear stable. Superficial right lateral chest wall stranding compatible with contusion or mild hematoma on series 3, image 41 overlying the 6th rib. There is mild intercostal space hematoma right anterolateral 3/4 interspace on series 3, image 26. CT ABDOMEN PELVIS FINDINGS Hepatobiliary: Stable coarsely calcified left hepatic mass, 7.7 centimeters. Stable liver parenchyma elsewhere with occasional subcentimeter low-density areas. Chronic cholecystectomy. Pancreas: Stable pancreatic atrophy. Spleen: Stable splenic heterogeneity with multiple predominantly subcentimeter hypodense areas. Adrenals/Urinary Tract: Negative adrenal glands. Chronic bilateral ureteral stents are in place with configuration not significantly changed since June 2019. Bilateral renal enhancement and contrast excretion appears symmetric and within normal limits. Stable renal parenchyma with cortical scarring. Mildly distended but otherwise unremarkable urinary bladder. Stomach/Bowel: Negative rectum. Sigmoid tortuosity and diverticulosis. Similar extensive diverticulosis in the descending and transverse colon. Negative right colon and appendix. No dilated small bowel. Negative stomach. No free air or free fluid. Vascular/Lymphatic: Aortoiliac calcified atherosclerosis. Major arterial structures appear patent and intact.  Portal venous system is patent. Reproductive: Stable, negative. Other: No pelvic free fluid. Musculoskeletal: Lumbar spine appears stable including lower lumbar spondylolisthesis and degenerative changes. Sacrum and SI joints appear intact. Subtle nondisplaced fracture through the posteromedial right iliac bone on series 3, image 97 and coronal image 128. The nearby right SI joint appears stable. No other acute pelvic fracture identified. No proximal femur fracture identified. No superficial soft tissue injury identified. IMPRESSION: 1. Acute non- to mildly displaced fractures of the right 4th, 7th, and 9th ribs 2. Trace right hemothorax, small right intercostal hematoma, and possible small lateral segment right middle lobe pulmonary contusion. 3. Acute nondisplaced fracture of the posteromedial right iliac bone. The nearby right SI joint appears stable.  No other pelvic fracture identified. 4. No other acute traumatic injury identified. CT thoracic and lumbar spine are reported separately. 5. Stable chronic findings in the chest abdomen and pelvis including: Calcified left hepatic mass, small splenic lesions, bilateral ureteral stents, diverticulosis of the colon, Aortic Atherosclerosis (ICD10-I70.0). Electronically Signed   By: Genevie Ann M.D.   On: 07/02/2018 18:28   Dg Pelvis Portable  Result Date: 07/02/2018 CLINICAL DATA:  Ran over by tractor. EXAM: PORTABLE PELVIS 1-2 VIEWS COMPARISON:  None. FINDINGS: There is no evidence of pelvic fracture or diastasis. No pelvic bone lesions are seen. IMPRESSION: Negative. Electronically Signed   By: Marijo Conception, M.D.   On: 07/02/2018 17:16   Ct T-spine No Charge  Result Date: 07/02/2018 CLINICAL DATA:  85 year old male run over by tractor. Pain. EXAM: CT THORACIC SPINE WITH CONTRAST CT LUMBAR SPINE WITH CONTRAST TECHNIQUE: Multiplanar CT images of the thoracic and lumbar spine were reconstructed from contemporary CTs of the Chest abdomen, and pelvis. CONTRAST:  No  additional. COMPARISON:  CT Chest, Abdomen, and Pelvis today are reported separately. CT Abdomen and Pelvis 09/27/2017. FINDINGS: THORACIC SPINE: Segmentation: Normal. Alignment: Preserved thoracic kyphosis. Vertebrae: Thoracic vertebrae appear intact. Right rib fractures are reported separately today. Paraspinal and other soft tissues: Reported separately today. Disc levels: No CT evidence of thoracic spinal stenosis and mild for age degenerative changes. LUMBAR SPINE: Segmentation: Normal. Alignment: Stable since 2019 with grade 1 anterolisthesis of L5 on S1. Vertebrae: Lumbar vertebrae appear stable and intact. Visible sacrum and SI joints appear stable and intact. Paraspinal and other soft tissues: Reported separately today. Disc levels: Widespread lumbar spine degeneration appears stable since 2019. IMPRESSION: 1. No acute osseous abnormality in the thoracic or lumbar spine. 2.  CT Chest, Abdomen, and Pelvis today are reported separately. Electronically Signed   By: Genevie Ann M.D.   On: 07/02/2018 18:20   Ct L-spine No Charge  Result Date: 07/02/2018 CLINICAL DATA:  85 year old male run over by tractor. Pain. EXAM: CT THORACIC SPINE WITH CONTRAST CT LUMBAR SPINE WITH CONTRAST TECHNIQUE: Multiplanar CT images of the thoracic and lumbar spine were reconstructed from contemporary CTs of the Chest abdomen, and pelvis. CONTRAST:  No additional. COMPARISON:  CT Chest, Abdomen, and Pelvis today are reported separately. CT Abdomen and Pelvis 09/27/2017. FINDINGS: THORACIC SPINE: Segmentation: Normal. Alignment: Preserved thoracic kyphosis. Vertebrae: Thoracic vertebrae appear intact. Right rib fractures are reported separately today. Paraspinal and other soft tissues: Reported separately today. Disc levels: No CT evidence of thoracic spinal stenosis and mild for age degenerative changes. LUMBAR SPINE: Segmentation: Normal. Alignment: Stable since 2019 with grade 1 anterolisthesis of L5 on S1. Vertebrae: Lumbar  vertebrae appear stable and intact. Visible sacrum and SI joints appear stable and intact. Paraspinal and other soft tissues: Reported separately today. Disc levels: Widespread lumbar spine degeneration appears stable since 2019. IMPRESSION: 1. No acute osseous abnormality in the thoracic or lumbar spine. 2.  CT Chest, Abdomen, and Pelvis today are reported separately. Electronically Signed   By: Genevie Ann M.D.   On: 07/02/2018 18:20   Dg Chest Port 1 View  Result Date: 07/02/2018 CLINICAL DATA:  Run over by tractor. EXAM: PORTABLE CHEST 1 VIEW COMPARISON:  None. FINDINGS: The heart size and mediastinal contours are within normal limits. Both lungs are clear. No pneumothorax or pleural effusion is noted. Mildly displaced right seventh rib fracture. Right subclavian Port-A-Cath is noted with tip in right atrium. IMPRESSION: Mildly displaced right seventh rib fracture. No other  definite traumatic abnormality seen. Electronically Signed   By: Marijo Conception, M.D.   On: 07/02/2018 17:15   Dg Knee Complete 4 Views Right  Result Date: 07/02/2018 CLINICAL DATA:  Run over by tractor.  Knee pain. EXAM: RIGHT KNEE - COMPLETE 4+ VIEW COMPARISON:  None. FINDINGS: No evidence of fracture, dislocation or joint effusion. Chronic age related chondrocalcinosis. Arterial calcification throughout the region. IMPRESSION: Negative. Electronically Signed   By: Nelson Chimes M.D.   On: 07/02/2018 18:58    Review of Systems  Constitutional: Positive for malaise/fatigue. Negative for chills and fever.  HENT: Negative for hearing loss.   Eyes: Negative for blurred vision and double vision.  Respiratory: Negative for cough and hemoptysis.   Cardiovascular: Positive for chest pain. Negative for palpitations.  Gastrointestinal: Negative for abdominal pain, nausea and vomiting.  Genitourinary: Positive for flank pain and hematuria. Negative for dysuria and urgency.  Musculoskeletal: Negative for myalgias and neck pain.  Skin:  Negative for itching and rash.  Neurological: Negative for dizziness, tingling and headaches.  Endo/Heme/Allergies: Does not bruise/bleed easily.  Psychiatric/Behavioral: Negative for depression and suicidal ideas.   Blood pressure (!) 116/55, pulse 66, temperature 97.8 F (36.6 C), temperature source Temporal, resp. rate 14, height 5\' 8"  (1.727 m), weight 74.8 kg, SpO2 99 %. Physical Exam  Vitals reviewed. Constitutional: He is oriented to person, place, and time. He appears well-developed and well-nourished.  HENT:  Head: Normocephalic and atraumatic.  Eyes: Pupils are equal, round, and reactive to light. Conjunctivae and EOM are normal.  Neck: Normal range of motion. Neck supple.  Cardiovascular: Normal rate and regular rhythm.  Respiratory: Effort normal and breath sounds normal. He exhibits tenderness.  GI: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

## 2018-07-02 NOTE — ED Notes (Signed)
C-Collar removed per verbal order from EDP

## 2018-07-02 NOTE — ED Notes (Signed)
Emergency release blood returned to blood bank

## 2018-07-02 NOTE — ED Notes (Signed)
Incentive spirometer provided to patient. Spirometry: 1600

## 2018-07-02 NOTE — ED Provider Notes (Signed)
[facility] Provider Note   CSN: [id number] Arrival date & time: 07/02/18  1651    History   Chief Complaint Chief Complaint  Patient presents with  . Trauma    HPI SOCORRO EBRON is a 85 y.o. male.     The history is provided by the patient.  Trauma Mechanism of injury: crush injury Injury location: torso Injury location detail: abdomen Incident location: around machinery Arrived directly from scene: yes  Crush injury:      Mechanism: industrial machinery      Approximate weight of object: unknown medium size tractor   EMS/PTA data:      IV access: established      Fluids administered: none  Current symptoms:      Pain scale: 3/10      Pain quality: aching and dull      Associated symptoms:            Reports abdominal pain (right and left flank pain).            Denies back pain, chest pain, seizures and vomiting.   Relevant PMH:      Pharmacological risk factors:            Antiplatelet therapy (on xarleto ).    Past Medical History:  Diagnosis Date  . A-fib ([place])   . Lymphoma ([place])    Non-hodgkins  . Ureteral stent retained     Patient Active Problem List   Diagnosis Date Noted  . Rib fractures 07/02/2018      Home Medications    Prior to Admission medications   Medication Sig Start Date End Date Taking? Authorizing Provider  allopurinol (ZYLOPRIM) 300 MG tablet Take 300 mg by mouth daily.   Yes [provider]  atorvastatin (LIPITOR) 20 MG tablet Take 20 mg by mouth daily.   Yes [provider]  Calcium-Magnesium-Vitamin D (CALCIUM 1200+D3 PO) Take 1 tablet by mouth every evening.   Yes [provider]  cetirizine (ZYRTEC) 10 MG tablet Take 10 mg by mouth daily.   Yes [provider]  cholestyramine (QUESTRAN) 4 g packet Take 4 g by mouth daily as needed (diarrhea after treatments).   Yes [provider]  Cyanocobalamin 2500 MCG TABS Take 2,500 mcg by mouth  daily.   Yes [provider]  cyclobenzaprine (FLEXERIL) 10 MG tablet Take 5 mg by mouth daily as needed (back pain).   Yes [provider]  fenofibrate 160 MG tablet Take 160 mg by mouth daily.   Yes [provider]  gabapentin (NEURONTIN) 300 MG capsule Take 300 mg by mouth 3 (three) times daily.   Yes [provider]  Insulin Glargine, 2 Unit Dial, (TOUJEO MAX SOLOSTAR) 300 UNIT/ML SOPN Inject 48 Units into the skin daily before breakfast.   Yes [provider]  latanoprost (XALATAN) 0.005 % ophthalmic solution Place 1 drop into both eyes at bedtime.   Yes [provider]  lidocaine-prilocaine (EMLA) cream Apply 1 application topically as needed (for portacath before treatment).   Yes [provider]  lipase/protease/amylase (CREON) 12000 units CPEP capsule Take 12,000 Units by mouth 3 (three) times daily with meals.   Yes [provider]  magnesium oxide (MAG-OX) 400 MG tablet Take 400 mg by mouth 2 (two) times daily.   Yes [provider]  metFORMIN (GLUCOPHAGE-XR) 500 MG 24 hr tablet Take 1,000 mg by mouth every evening. 06/15/18  Yes [provider]  Multiple Vitamin (MULTIVITAMIN WITH MINERALS) TABS tablet Take 1 tablet by mouth daily.   Yes [provider]  Rivaroxaban (XARELTO) 15 MG TABS tablet Take 15 mg by mouth daily with lunch.    Yes [provider]  sertraline (ZOLOFT) 100 MG tablet Take 100 mg by mouth daily.   Yes [provider]  vitamin C (ASCORBIC ACID) 500 MG tablet Take 500 mg by mouth daily.   Yes [provider]    Family History No family history on file.  Social History Social History   Tobacco Use  . Smoking status: Never Smoker  . Smokeless tobacco: Never Used  Substance Use Topics  . Alcohol use: Never    Frequency: Never  . Drug use: Never     Allergies   Atenolol and Niacin and related   Review of Systems Review of Systems    Constitutional: Negative for chills and fever.  HENT: Negative for ear pain and sore throat.   Eyes: Negative for pain and visual disturbance.  Respiratory: Negative for cough and shortness of breath.   Cardiovascular: Negative for chest pain and palpitations.  Gastrointestinal: Positive for abdominal pain (right and left flank pain). Negative for vomiting.  Genitourinary: Negative for dysuria and hematuria.  Musculoskeletal: Negative for arthralgias and back pain.  Skin: Negative for color change and rash.  Neurological: Negative for seizures and syncope.  All other systems reviewed and are negative.    Physical Exam Updated Vital Signs BP (!) 116/55   Pulse 66   Temp 97.8 F (36.6 C) (Temporal)   Resp 14   Ht 5\' 8"  (1.727 m)   Wt 74.8 kg   SpO2 99%   BMI 25.09 kg/m   Physical Exam Constitutional:      General: He is not in acute distress. HENT:     Head: Normocephalic and atraumatic.     Nose: Nose normal.     Mouth/Throat:     Mouth: Mucous membranes are moist.  Eyes:     Extraocular Movements: Extraocular movements intact.     Pupils: Pupils are equal, round, and reactive to light.  Neck:     Musculoskeletal: Neck supple. No muscular tenderness.     Comments: In cervical collar Cardiovascular:     Rate and Rhythm: Normal rate and regular rhythm.     Pulses: Normal pulses.     Heart sounds: Normal heart sounds.  Pulmonary:     Effort: Pulmonary effort is normal. No respiratory distress.     Breath sounds: Normal breath sounds. No wheezing.  Abdominal:     General: There is distension (left side with bruising in LLQ).     Tenderness: There is abdominal tenderness (RUQ/LUQ). There is no guarding.  Musculoskeletal: Normal range of motion.        General: Tenderness (To right side of rib cage) present. No swelling.     Comments: No midline spinal tenderness  Skin:    General: Skin is warm and dry.     Capillary Refill: Capillary refill takes less than 2  seconds.     Findings: Bruising (over left lower abdomen) present.  Neurological:     General: No focal deficit present.     Mental Status: He is alert.  Psychiatric:        Mood and Affect: Mood normal.      ED Treatments / Results  Labs (all labs ordered are listed, but only abnormal results are displayed) Labs Reviewed  COMPREHENSIVE METABOLIC PANEL -  Abnormal; Notable for the following components:      Result Value   Potassium 3.4 (*)    Glucose, Bld 204 (*)    BUN 35 (*)    Creatinine, Ser 1.78 (*)    Total Protein 5.5 (*)    Albumin 3.4 (*)    GFR calc non Af Amer 34 (*)    GFR calc Af Amer 40 (*)    All other components within normal limits  CBC - Abnormal; Notable for the following components:   RBC 3.31 (*)    Hemoglobin 8.7 (*)    HCT 28.3 (*)    Platelets 144 (*)    All other components within normal limits  PROTIME-INR - Abnormal; Notable for the following components:   Prothrombin Time 27.5 (*)    INR 2.6 (*)    All other components within normal limits  POCT I-STAT EG7 - Abnormal; Notable for the following components:   pH, Ven 7.463 (*)    pCO2, Ven 31.7 (*)    Calcium, Ion 1.13 (*)    HCT 27.0 (*)    Hemoglobin 9.2 (*)    All other components within normal limits  I-STAT CREATININE, ED - Abnormal; Notable for the following components:   Creatinine, Ser 1.80 (*)    All other components within normal limits  ETHANOL  LACTIC ACID, PLASMA  CDS SEROLOGY  URINALYSIS, ROUTINE W REFLEX MICROSCOPIC  CBC  BASIC METABOLIC PANEL  PREPARE FRESH FROZEN PLASMA  TYPE AND SCREEN  ABO/RH    EKG None  Radiology Dg Forearm Left  Result Date: 07/02/2018 CLINICAL DATA:  Run over by tractor.  Pain. EXAM: LEFT FOREARM - 2 VIEW COMPARISON:  None. FINDINGS: No evidence of fracture or dislocation. Regional arterial calcification incidentally noted. IMPRESSION: Negative. Electronically Signed   By: Nelson Chimes M.D.   On: 07/02/2018 18:59   Dg Forearm  Right  Result Date: 07/02/2018 CLINICAL DATA:  Run over by tractor.  Pain. EXAM: RIGHT FOREARM - 2 VIEW COMPARISON:  None. FINDINGS: There is no evidence of fracture or other focal bone lesions. Soft tissues are unremarkable. Incidental age related arterial calcification. IMPRESSION: Negative. Electronically Signed   By: Nelson Chimes M.D.   On: 07/02/2018 19:00   Ct Head Wo Contrast  Result Date: 07/02/2018 CLINICAL DATA:  85 year old male run over by tractor. EXAM: CT HEAD WITHOUT CONTRAST CT CERVICAL SPINE WITHOUT CONTRAST TECHNIQUE: Multidetector CT imaging of the head and cervical spine was performed following the standard protocol without intravenous contrast. Multiplanar CT image reconstructions of the cervical spine were also generated. COMPARISON:  None available, a duplicate account in PACS is being investigated and merge at this time. FINDINGS: CT HEAD FINDINGS Brain: Cerebral volume loss appears generalized. Mild to moderate for age patchy white matter hypodensity. No midline shift, ventriculomegaly, mass effect, evidence of mass lesion, intracranial hemorrhage or evidence of cortically based acute infarction. No cortical encephalomalacia. Vascular: Extensive calcified atherosclerosis at the skull base. Mild intracranial artery tortuosity. Skull: Intact. Sinuses/Orbits: Paranasal sinuses and mastoids are well pneumatized. Other: No definite acute orbit or scalp soft tissue finding. CT CERVICAL SPINE FINDINGS Alignment: Straightening of cervical lordosis. Cervicothoracic junction alignment is within normal limits. Bilateral posterior element alignment is within normal limits. Skull base and vertebrae: Visualized skull base is intact. No atlanto-occipital dissociation. Osteopenia. No acute osseous abnormality identified. Soft tissues and spinal canal: No prevertebral fluid or swelling. No visible canal hematoma. Calcified cervical carotid atherosclerosis. Otherwise negative noncontrast neck soft  tissues. Disc levels: Widespread cervical spine degeneration. Up to mild degenerative spinal stenosis at C4-C5. Upper chest: Grossly intact visible upper thoracic levels. Negative lung apices. IMPRESSION: 1. No acute traumatic injury identified in the head or cervical spine. 2. No acute intracranial abnormality. 3. Cervical spine degeneration. Extensive calcified atherosclerosis. Electronically Signed   By: Genevie Ann M.D.   On: 07/02/2018 17:55   Ct Chest W Contrast  Result Date: 07/02/2018 CLINICAL DATA:  85 year old male run over by tractor. Follicular lymphoma. EXAM: CT CHEST, ABDOMEN, AND PELVIS WITH CONTRAST TECHNIQUE: Multidetector CT imaging of the chest, abdomen and pelvis was performed following the standard protocol during bolus administration of intravenous contrast. CONTRAST:  119mL OMNIPAQUE IOHEXOL 300 MG/ML  SOLN COMPARISON:  PET-CT 04/26/2018. Chest abdomen and pelvis CT 09/27/2017. FINDINGS: CT CHEST FINDINGS Cardiovascular: Intact thoracic aorta. Calcified coronary artery and Calcified aortic atherosclerosis. Mild cardiomegaly appears stable. No pericardial effusion. Stable right chest porta cath. Mediastinum/Nodes: Negative for mediastinal lymphadenopathy or hematoma. Lungs/Pleura: Major airways are patent. Dependent atelectasis in both lungs similar to that in 2019. Trace right pleural effusion/hemothorax. No right pneumothorax. Possible small lateral segment right middle lobe pulmonary contusion on series 4, image 103. Right lateral costophrenic angle atelectasis appears stable. No contralateral left pneumothorax, pleural effusion or pulmonary contusion. Musculoskeletal: Acute mildly displaced fractures of the right anterolateral 4th and lateral 7 ribs. A nondisplaced fracture of the right lateral 6th rib appears chronic. Nondisplaced fracture of the right lateral 5th rib is age indeterminate. There is a new nondisplaced fracture of the right lateral 9th rib. Stable chronic left anterior 3rd  rib nondisplaced fracture. No acute left rib fracture identified. Sternum appears intact. Thoracic vertebrae appear stable. Visible shoulder osseous structures appear stable. Superficial right lateral chest wall stranding compatible with contusion or mild hematoma on series 3, image 41 overlying the 6th rib. There is mild intercostal space hematoma right anterolateral 3/4 interspace on series 3, image 26. CT ABDOMEN PELVIS FINDINGS Hepatobiliary: Stable coarsely calcified left hepatic mass, 7.7 centimeters. Stable liver parenchyma elsewhere with occasional subcentimeter low-density areas. Chronic cholecystectomy. Pancreas: Stable pancreatic atrophy. Spleen: Stable splenic heterogeneity with multiple predominantly subcentimeter hypodense areas. Adrenals/Urinary Tract: Negative adrenal glands. Chronic bilateral ureteral stents are in place with configuration not significantly changed since June 2019. Bilateral renal enhancement and contrast excretion appears symmetric and within normal limits. Stable renal parenchyma with cortical scarring. Mildly distended but otherwise unremarkable urinary bladder. Stomach/Bowel: Negative rectum. Sigmoid tortuosity and diverticulosis. Similar extensive diverticulosis in the descending and transverse colon. Negative right colon and appendix. No dilated small bowel. Negative stomach. No free air or free fluid. Vascular/Lymphatic: Aortoiliac calcified atherosclerosis. Major arterial structures appear patent and intact. Portal venous system is patent. Reproductive: Stable, negative. Other: No pelvic free fluid. Musculoskeletal: Lumbar spine appears stable including lower lumbar spondylolisthesis and degenerative changes. Sacrum and SI joints appear intact. Subtle nondisplaced fracture through the posteromedial right iliac bone on series 3, image 97 and coronal image 128. The nearby right SI joint appears stable. No other acute pelvic fracture identified. No proximal femur fracture  identified. No superficial soft tissue injury identified. IMPRESSION: 1. Acute non- to mildly displaced fractures of the right 4th, 7th, and 9th ribs 2. Trace right hemothorax, small right intercostal hematoma, and possible small lateral segment right middle lobe pulmonary contusion. 3. Acute nondisplaced fracture of the posteromedial right iliac bone. The nearby right SI joint appears stable. No other pelvic fracture identified. 4. No other acute traumatic injury identified. CT thoracic and  lumbar spine are reported separately. 5. Stable chronic findings in the chest abdomen and pelvis including: Calcified left hepatic mass, small splenic lesions, bilateral ureteral stents, diverticulosis of the colon, Aortic Atherosclerosis (ICD10-I70.0). Electronically Signed   By: Genevie Ann M.D.   On: 07/02/2018 18:28   Ct Cervical Spine Wo Contrast  Result Date: 07/02/2018 CLINICAL DATA:  85 year old male run over by tractor. EXAM: CT HEAD WITHOUT CONTRAST CT CERVICAL SPINE WITHOUT CONTRAST TECHNIQUE: Multidetector CT imaging of the head and cervical spine was performed following the standard protocol without intravenous contrast. Multiplanar CT image reconstructions of the cervical spine were also generated. COMPARISON:  None available, a duplicate account in PACS is being investigated and merge at this time. FINDINGS: CT HEAD FINDINGS Brain: Cerebral volume loss appears generalized. Mild to moderate for age patchy white matter hypodensity. No midline shift, ventriculomegaly, mass effect, evidence of mass lesion, intracranial hemorrhage or evidence of cortically based acute infarction. No cortical encephalomalacia. Vascular: Extensive calcified atherosclerosis at the skull base. Mild intracranial artery tortuosity. Skull: Intact. Sinuses/Orbits: Paranasal sinuses and mastoids are well pneumatized. Other: No definite acute orbit or scalp soft tissue finding. CT CERVICAL SPINE FINDINGS Alignment: Straightening of cervical  lordosis. Cervicothoracic junction alignment is within normal limits. Bilateral posterior element alignment is within normal limits. Skull base and vertebrae: Visualized skull base is intact. No atlanto-occipital dissociation. Osteopenia. No acute osseous abnormality identified. Soft tissues and spinal canal: No prevertebral fluid or swelling. No visible canal hematoma. Calcified cervical carotid atherosclerosis. Otherwise negative noncontrast neck soft tissues. Disc levels: Widespread cervical spine degeneration. Up to mild degenerative spinal stenosis at C4-C5. Upper chest: Grossly intact visible upper thoracic levels. Negative lung apices. IMPRESSION: 1. No acute traumatic injury identified in the head or cervical spine. 2. No acute intracranial abnormality. 3. Cervical spine degeneration. Extensive calcified atherosclerosis. Electronically Signed   By: Genevie Ann M.D.   On: 07/02/2018 17:55   Ct Abdomen Pelvis W Contrast  Result Date: 07/02/2018 CLINICAL DATA:  85 year old male run over by tractor. Follicular lymphoma. EXAM: CT CHEST, ABDOMEN, AND PELVIS WITH CONTRAST TECHNIQUE: Multidetector CT imaging of the chest, abdomen and pelvis was performed following the standard protocol during bolus administration of intravenous contrast. CONTRAST:  171mL OMNIPAQUE IOHEXOL 300 MG/ML  SOLN COMPARISON:  PET-CT 04/26/2018. Chest abdomen and pelvis CT 09/27/2017. FINDINGS: CT CHEST FINDINGS Cardiovascular: Intact thoracic aorta. Calcified coronary artery and Calcified aortic atherosclerosis. Mild cardiomegaly appears stable. No pericardial effusion. Stable right chest porta cath. Mediastinum/Nodes: Negative for mediastinal lymphadenopathy or hematoma. Lungs/Pleura: Major airways are patent. Dependent atelectasis in both lungs similar to that in 2019. Trace right pleural effusion/hemothorax. No right pneumothorax. Possible small lateral segment right middle lobe pulmonary contusion on series 4, image 103. Right lateral  costophrenic angle atelectasis appears stable. No contralateral left pneumothorax, pleural effusion or pulmonary contusion. Musculoskeletal: Acute mildly displaced fractures of the right anterolateral 4th and lateral 7 ribs. A nondisplaced fracture of the right lateral 6th rib appears chronic. Nondisplaced fracture of the right lateral 5th rib is age indeterminate. There is a new nondisplaced fracture of the right lateral 9th rib. Stable chronic left anterior 3rd rib nondisplaced fracture. No acute left rib fracture identified. Sternum appears intact. Thoracic vertebrae appear stable. Visible shoulder osseous structures appear stable. Superficial right lateral chest wall stranding compatible with contusion or mild hematoma on series 3, image 41 overlying the 6th rib. There is mild intercostal space hematoma right anterolateral 3/4 interspace on series 3, image 26. CT ABDOMEN PELVIS  FINDINGS Hepatobiliary: Stable coarsely calcified left hepatic mass, 7.7 centimeters. Stable liver parenchyma elsewhere with occasional subcentimeter low-density areas. Chronic cholecystectomy. Pancreas: Stable pancreatic atrophy. Spleen: Stable splenic heterogeneity with multiple predominantly subcentimeter hypodense areas. Adrenals/Urinary Tract: Negative adrenal glands. Chronic bilateral ureteral stents are in place with configuration not significantly changed since June 2019. Bilateral renal enhancement and contrast excretion appears symmetric and within normal limits. Stable renal parenchyma with cortical scarring. Mildly distended but otherwise unremarkable urinary bladder. Stomach/Bowel: Negative rectum. Sigmoid tortuosity and diverticulosis. Similar extensive diverticulosis in the descending and transverse colon. Negative right colon and appendix. No dilated small bowel. Negative stomach. No free air or free fluid. Vascular/Lymphatic: Aortoiliac calcified atherosclerosis. Major arterial structures appear patent and intact. Portal  venous system is patent. Reproductive: Stable, negative. Other: No pelvic free fluid. Musculoskeletal: Lumbar spine appears stable including lower lumbar spondylolisthesis and degenerative changes. Sacrum and SI joints appear intact. Subtle nondisplaced fracture through the posteromedial right iliac bone on series 3, image 97 and coronal image 128. The nearby right SI joint appears stable. No other acute pelvic fracture identified. No proximal femur fracture identified. No superficial soft tissue injury identified. IMPRESSION: 1. Acute non- to mildly displaced fractures of the right 4th, 7th, and 9th ribs 2. Trace right hemothorax, small right intercostal hematoma, and possible small lateral segment right middle lobe pulmonary contusion. 3. Acute nondisplaced fracture of the posteromedial right iliac bone. The nearby right SI joint appears stable. No other pelvic fracture identified. 4. No other acute traumatic injury identified. CT thoracic and lumbar spine are reported separately. 5. Stable chronic findings in the chest abdomen and pelvis including: Calcified left hepatic mass, small splenic lesions, bilateral ureteral stents, diverticulosis of the colon, Aortic Atherosclerosis (ICD10-I70.0). Electronically Signed   By: Genevie Ann M.D.   On: 07/02/2018 18:28   Dg Pelvis Portable  Result Date: 07/02/2018 CLINICAL DATA:  Ran over by tractor. EXAM: PORTABLE PELVIS 1-2 VIEWS COMPARISON:  None. FINDINGS: There is no evidence of pelvic fracture or diastasis. No pelvic bone lesions are seen. IMPRESSION: Negative. Electronically Signed   By: Marijo Conception, M.D.   On: 07/02/2018 17:16   Ct T-spine No Charge  Result Date: 07/02/2018 CLINICAL DATA:  85 year old male run over by tractor. Pain. EXAM: CT THORACIC SPINE WITH CONTRAST CT LUMBAR SPINE WITH CONTRAST TECHNIQUE: Multiplanar CT images of the thoracic and lumbar spine were reconstructed from contemporary CTs of the Chest abdomen, and pelvis. CONTRAST:  No  additional. COMPARISON:  CT Chest, Abdomen, and Pelvis today are reported separately. CT Abdomen and Pelvis 09/27/2017. FINDINGS: THORACIC SPINE: Segmentation: Normal. Alignment: Preserved thoracic kyphosis. Vertebrae: Thoracic vertebrae appear intact. Right rib fractures are reported separately today. Paraspinal and other soft tissues: Reported separately today. Disc levels: No CT evidence of thoracic spinal stenosis and mild for age degenerative changes. LUMBAR SPINE: Segmentation: Normal. Alignment: Stable since 2019 with grade 1 anterolisthesis of L5 on S1. Vertebrae: Lumbar vertebrae appear stable and intact. Visible sacrum and SI joints appear stable and intact. Paraspinal and other soft tissues: Reported separately today. Disc levels: Widespread lumbar spine degeneration appears stable since 2019. IMPRESSION: 1. No acute osseous abnormality in the thoracic or lumbar spine. 2.  CT Chest, Abdomen, and Pelvis today are reported separately. Electronically Signed   By: Genevie Ann M.D.   On: 07/02/2018 18:20   Ct L-spine No Charge  Result Date: 07/02/2018 CLINICAL DATA:  85 year old male run over by tractor. Pain. EXAM: CT THORACIC SPINE WITH CONTRAST CT LUMBAR  SPINE WITH CONTRAST TECHNIQUE: Multiplanar CT images of the thoracic and lumbar spine were reconstructed from contemporary CTs of the Chest abdomen, and pelvis. CONTRAST:  No additional. COMPARISON:  CT Chest, Abdomen, and Pelvis today are reported separately. CT Abdomen and Pelvis 09/27/2017. FINDINGS: THORACIC SPINE: Segmentation: Normal. Alignment: Preserved thoracic kyphosis. Vertebrae: Thoracic vertebrae appear intact. Right rib fractures are reported separately today. Paraspinal and other soft tissues: Reported separately today. Disc levels: No CT evidence of thoracic spinal stenosis and mild for age degenerative changes. LUMBAR SPINE: Segmentation: Normal. Alignment: Stable since 2019 with grade 1 anterolisthesis of L5 on S1. Vertebrae: Lumbar  vertebrae appear stable and intact. Visible sacrum and SI joints appear stable and intact. Paraspinal and other soft tissues: Reported separately today. Disc levels: Widespread lumbar spine degeneration appears stable since 2019. IMPRESSION: 1. No acute osseous abnormality in the thoracic or lumbar spine. 2.  CT Chest, Abdomen, and Pelvis today are reported separately. Electronically Signed   By: Genevie Ann M.D.   On: 07/02/2018 18:20   Dg Chest Port 1 View  Result Date: 07/02/2018 CLINICAL DATA:  Run over by tractor. EXAM: PORTABLE CHEST 1 VIEW COMPARISON:  None. FINDINGS: The heart size and mediastinal contours are within normal limits. Both lungs are clear. No pneumothorax or pleural effusion is noted. Mildly displaced right seventh rib fracture. Right subclavian Port-A-Cath is noted with tip in right atrium. IMPRESSION: Mildly displaced right seventh rib fracture. No other definite traumatic abnormality seen. Electronically Signed   By: Marijo Conception, M.D.   On: 07/02/2018 17:15   Dg Knee Complete 4 Views Right  Result Date: 07/02/2018 CLINICAL DATA:  Run over by tractor.  Knee pain. EXAM: RIGHT KNEE - COMPLETE 4+ VIEW COMPARISON:  None. FINDINGS: No evidence of fracture, dislocation or joint effusion. Chronic age related chondrocalcinosis. Arterial calcification throughout the region. IMPRESSION: Negative. Electronically Signed   By: Nelson Chimes M.D.   On: 07/02/2018 18:58    Procedures Procedures (including critical care time)  Medications Ordered in ED Medications  fentaNYL (SUBLIMAZE) injection 50 mcg (has no administration in time range)  acetaminophen (TYLENOL) tablet 650 mg (has no administration in time range)  morphine 2 MG/ML injection 2-4 mg (has no administration in time range)  docusate sodium (COLACE) capsule 100 mg (has no administration in time range)  dextrose 5 %-0.45 % sodium chloride infusion (has no administration in time range)  oxyCODONE (Oxy IR/ROXICODONE) immediate  release tablet 5 mg (has no administration in time range)  ondansetron (ZOFRAN-ODT) disintegrating tablet 4 mg (has no administration in time range)    Or  ondansetron (ZOFRAN) injection 4 mg (has no administration in time range)  hydrALAZINE (APRESOLINE) injection 10 mg (has no administration in time range)  iohexol (OMNIPAQUE) 300 MG/ML solution 100 mL (100 mLs Intravenous Contrast Given 07/02/18 1727)   EMERGENCY DEPARTMENT [place] FAST EXAM "Limited Ultrasound of the Abdomen and Pericardium" (FAST Exam).   INDICATIONS:Blunt injury of abdomen Multiple views of the abdomen and pericardium are obtained with a multi-frequency probe.  PERFORMED BY: Myself IMAGES ARCHIVED?: Yes LIMITATIONS:  Emergent procedure INTERPRETATION: Overall unremarkable fast exam except for possible bleeding within the spleen however limited to body habitus, was unable to save certain views due to ultrasound malfunction with saving, overall suspect splenic injury  Initial Impression / Assessment and Plan / ED Course  I have reviewed the triage vital signs and the nursing notes.  Pertinent labs & imaging results that were available during my care of the  patient were reviewed by me and considered in my medical decision making (see chart for details).       KRISHIV SANDLER is an 85 year old male with history of atrial fibrillation on Xarelto who presents to the ED as a level 1 trauma.  Patient with normal vitals.  No fever.  Patient had about a one-time tractor rollover his abdomen.  Has mostly right-sided abdominal pain.  Has some bruising to his left lower abdomen.  Patient overall alert.  Overall comfortable.  Airway, breathing, circulation intact upon evaluation.  Patient with normal vitals.  Patient was downgraded to a level 2 upon arrival.  Bedside chest x-ray and pelvic x-ray were overall unremarkable.  Had possible rib fracture.  No pneumothorax.  Bedside ultrasound of the abdomen was overall unremarkable.  Limited  due to body habitus.  Patient did have some skin tears to bilateral forearms, right knee.  No midline spinal pain.  Did not lose consciousness.  Has some bruising to the left lower abdomen area.  Tender over the right side of his ribs.  Trauma scans showed multiple right-sided rib fractures, right-sided pulmonary contusion, right-sided hemothorax.  Patient also with nondisplaced right sided iliac bone fracture.  Dr. Percell Miller with orthopedics was consulted and will evaluate the patient in the morning.  Right now can be toe-touch weightbearing.  Patient admitted to trauma service given multiple rib fractures and pulmonary contusion.  However, patient breathing well on room air.  Pain well controlled with IV fentanyl.  Patient pulled 1600 cc on incentive spirometer.  Admitted in good condition.  This chart was dictated using voice recognition software.  Despite best efforts to proofread,  errors can occur which can change the documentation meaning.    Final Clinical Impressions(s) / ED Diagnoses   Final diagnoses:  Pain  Closed fracture of multiple ribs of right side, initial encounter  Contusion of right lung, initial encounter  Closed nondisplaced fracture of pelvis, unspecified part of pelvis, initial encounter [facility])    ED Discharge Orders    None       Lennice Sites, DO 07/02/18 2007

## 2018-07-02 NOTE — ED Notes (Signed)
ED TO INPATIENT HANDOFF REPORT  ED Nurse Name and Phone #: Donah Driver [phone]  S Name/Age/Gender [name] 85 y.o. male Room/Bed: TRACC/TRACC  Code Status   Code Status: Full Code  Home/SNF/Other Home Patient oriented to: self, place, time and situation Is this baseline? Yes   Triage Complete: Triage complete  Chief Complaint L2  Triage Note No notes on file   Allergies Allergies  Allergen Reactions  . Atenolol Other (See Comments)    "Heart rate slowed- STOPPED on 08/16/2013"  . Niacin And Related Other (See Comments)    Headache     Level of Care/Admitting Diagnosis ED Disposition    ED Disposition Condition [facility]: [place] [[id number]]  Level of Care: Med-Surg [16]  Diagnosis: Rib fractures [413244]  Admitting Physician: TRAUMA MD [place]  Attending Physician: TRAUMA MD [[id number]  PT Class (Do Not Modify): Observation [104]  PT Acc Code (Do Not Modify): Observation [10022]       B Medical/Surgery History Past Medical History:  Diagnosis Date  . A-fib ([place])   . Lymphoma ([place])    Non-hodgkins  . Ureteral stent retained    History reviewed. No pertinent surgical history.   A IV Location/Drains/Wounds Patient Lines/Drains/Airways Status   Active Line/Drains/Airways    Name:   Placement date:   Placement time:   Site:   Days:   Peripheral IV 07/02/18 Left Antecubital   07/02/18    1650    Antecubital   less than 1          Intake/Output Last 24 hours  Intake/Output Summary (Last 24 hours) at 07/02/2018 2013 Last data filed at 07/02/2018 1705 Gross per 24 hour  Intake 1000 ml  Output 0 ml  Net 1000 ml    Labs/Imaging Results for orders placed or performed during the hospital encounter of 07/02/18 (from the past 48 hour(s))  Prepare fresh frozen plasma     Status: None   Collection Time: 07/02/18  4:40 PM  Result Value Ref Range   Unit Number [id number]    Blood Component Type LIQ PLASMA     Unit division 00    Status of Unit REL FROM [facility]    Unit tag comment EMERGENCY RELEASE    Transfusion Status      OK TO TRANSFUSE Performed at [facility], [address], [place], [place] [address]    Unit Number [id number]    Blood Component Type LIQ PLASMA    Unit division 00    Status of Unit REL FROM [facility]    Unit tag comment EMERGENCY RELEASE    Transfusion Status OK TO TRANSFUSE   Type and screen Ordered by PROVIDER DEFAULT     Status: None   Collection Time: 07/02/18  4:55 PM  Result Value Ref Range   ABO/RH(D) A POS    Antibody Screen NEG    Sample Expiration      07/05/2018 Performed at [facility], [place] [address] [place], [place] [address]    Unit Number [id number]    Blood Component Type RED CELLS,LR    Unit division 00    Status of Unit REL FROM [facility]    Unit tag comment EMERGENCY RELEASE    Transfusion Status OK TO TRANSFUSE    Crossmatch Result NOT NEEDED    Unit Number [id number]    Blood Component Type RED CELLS,LR    Unit division 00    Status of  Unit REL FROM [facility]    Unit tag comment EMERGENCY RELEASE    Transfusion Status OK TO TRANSFUSE    Crossmatch Result NOT NEEDED   ABO/Rh     Status: None   Collection Time: 07/02/18  4:55 PM  Result Value Ref Range   ABO/RH(D)      A POS Performed at [facility], [place] [address] [place], [place] [address]   Comprehensive metabolic panel     Status: Abnormal   Collection Time: 07/02/18  4:59 PM  Result Value Ref Range   Sodium 137 135 - 145 mmol/L   Potassium 3.4 (L) 3.5 - 5.1 mmol/L   Chloride 106 98 - 111 mmol/L   CO2 23 22 - 32 mmol/L   Glucose, Bld 204 (H) 70 - 99 mg/dL   BUN 35 (H) 8 - 23 mg/dL   Creatinine, Ser 1.78 (H) 0.61 - 1.24 mg/dL   Calcium 8.9 8.9 - 10.3 mg/dL   Total Protein 5.5 (L) 6.5 - 8.1 g/dL   Albumin 3.4 (L) 3.5 - 5.0 g/dL   AST 37 15 - 41 U/L   ALT 24 0 - 44 U/L   Alkaline Phosphatase 48 38 - 126 U/L   Total Bilirubin 0.4 0.3 - 1.2 mg/dL   GFR  calc non Af Amer 34 (L) >60 mL/min   GFR calc Af Amer 40 (L) >60 mL/min   Anion gap 8 5 - 15    Comment: Performed at [facility], [place] [address] [place], [place] [address]  CBC     Status: Abnormal   Collection Time: 07/02/18  4:59 PM  Result Value Ref Range   WBC 5.9 4.0 - 10.5 K/uL   RBC 3.31 (L) 4.22 - 5.81 MIL/uL   Hemoglobin 8.7 (L) 13.0 - 17.0 g/dL   HCT 28.3 (L) 39.0 - 52.0 %   MCV 85.5 80.0 - 100.0 fL   MCH 26.3 26.0 - 34.0 pg   MCHC 30.7 30.0 - 36.0 g/dL   RDW 15.4 11.5 - 15.5 %   Platelets 144 (L) 150 - 400 K/uL   nRBC 0.0 0.0 - 0.2 %    Comment: Performed at [facility], [place] [address], [place], [place] [address]  Ethanol     Status: None   Collection Time: 07/02/18  4:59 PM  Result Value Ref Range   Alcohol, Ethyl (B) <10 <10 mg/dL    Comment: (NOTE) Lowest detectable limit for serum alcohol is 10 mg/dL. For medical purposes only. Performed at [facility], [place] [address], [place], [place] [address]   Lactic acid, plasma     Status: None   Collection Time: 07/02/18  4:59 PM  Result Value Ref Range   Lactic Acid, Venous 1.5 0.5 - 1.9 mmol/L    Comment: Performed at [place] [address], [place], [place] [address]  Protime-INR     Status: Abnormal   Collection Time: 07/02/18  4:59 PM  Result Value Ref Range   Prothrombin Time 27.5 (H) 11.4 - 15.2 seconds   INR 2.6 (H) 0.8 - 1.2    Comment: (NOTE) INR goal varies based on device and disease states. Performed at [facility], [place] [address] [place], [place] [address]   POCT I-Stat EG7     Status: Abnormal   Collection Time: 07/02/18  5:01 PM  Result Value Ref Range   pH, Ven 7.463 (H) 7.250 - 7.430  pCO2, Ven 31.7 (L) 44.0 - 60.0 mmHg   pO2, Ven 42.0 32.0 - 45.0 mmHg   Bicarbonate 22.7 20.0 - 28.0 mmol/L   TCO2 24 22 - 32 mmol/L   O2 Saturation 81.0 %   Acid-base deficit 1.0 0.0 - 2.0 mmol/L   Sodium 139 135 - 145 mmol/L   Potassium 3.5 3.5 - 5.1 mmol/L    Calcium, Ion 1.13 (L) 1.15 - 1.40 mmol/L   HCT 27.0 (L) 39.0 - 52.0 %   Hemoglobin 9.2 (L) 13.0 - 17.0 g/dL   Patient temperature HIDE    Sample type VENOUS   I-stat Creatinine, ED     Status: Abnormal   Collection Time: 07/02/18  5:01 PM  Result Value Ref Range   Creatinine, Ser 1.80 (H) 0.61 - 1.24 mg/dL   Dg Forearm Left  Result Date: 07/02/2018 CLINICAL DATA:  Run over by tractor.  Pain. EXAM: LEFT FOREARM - 2 VIEW COMPARISON:  None. FINDINGS: No evidence of fracture or dislocation. Regional arterial calcification incidentally noted. IMPRESSION: Negative. Electronically Signed   By: Nelson Chimes M.D.   On: 07/02/2018 18:59   Dg Forearm Right  Result Date: 07/02/2018 CLINICAL DATA:  Run over by tractor.  Pain. EXAM: RIGHT FOREARM - 2 VIEW COMPARISON:  None. FINDINGS: There is no evidence of fracture or other focal bone lesions. Soft tissues are unremarkable. Incidental age related arterial calcification. IMPRESSION: Negative. Electronically Signed   By: Nelson Chimes M.D.   On: 07/02/2018 19:00   Ct Head Wo Contrast  Result Date: 07/02/2018 CLINICAL DATA:  85 year old male run over by tractor. EXAM: CT HEAD WITHOUT CONTRAST CT CERVICAL SPINE WITHOUT CONTRAST TECHNIQUE: Multidetector CT imaging of the head and cervical spine was performed following the standard protocol without intravenous contrast. Multiplanar CT image reconstructions of the cervical spine were also generated. COMPARISON:  None available, a duplicate account in PACS is being investigated and merge at this time. FINDINGS: CT HEAD FINDINGS Brain: Cerebral volume loss appears generalized. Mild to moderate for age patchy white matter hypodensity. No midline shift, ventriculomegaly, mass effect, evidence of mass lesion, intracranial hemorrhage or evidence of cortically based acute infarction. No cortical encephalomalacia. Vascular: Extensive calcified atherosclerosis at the skull base. Mild intracranial artery tortuosity. Skull:  Intact. Sinuses/Orbits: Paranasal sinuses and mastoids are well pneumatized. Other: No definite acute orbit or scalp soft tissue finding. CT CERVICAL SPINE FINDINGS Alignment: Straightening of cervical lordosis. Cervicothoracic junction alignment is within normal limits. Bilateral posterior element alignment is within normal limits. Skull base and vertebrae: Visualized skull base is intact. No atlanto-occipital dissociation. Osteopenia. No acute osseous abnormality identified. Soft tissues and spinal canal: No prevertebral fluid or swelling. No visible canal hematoma. Calcified cervical carotid atherosclerosis. Otherwise negative noncontrast neck soft tissues. Disc levels: Widespread cervical spine degeneration. Up to mild degenerative spinal stenosis at C4-C5. Upper chest: Grossly intact visible upper thoracic levels. Negative lung apices. IMPRESSION: 1. No acute traumatic injury identified in the head or cervical spine. 2. No acute intracranial abnormality. 3. Cervical spine degeneration. Extensive calcified atherosclerosis. Electronically Signed   By: Genevie Ann M.D.   On: 07/02/2018 17:55   Ct Chest W Contrast  Result Date: 07/02/2018 CLINICAL DATA:  85 year old male run over by tractor. Follicular lymphoma. EXAM: CT CHEST, ABDOMEN, AND PELVIS WITH CONTRAST TECHNIQUE: Multidetector CT imaging of the chest, abdomen and pelvis was performed following the standard protocol during bolus administration of intravenous contrast. CONTRAST:  130mL OMNIPAQUE IOHEXOL 300 MG/ML  SOLN COMPARISON:  PET-CT  04/26/2018. Chest abdomen and pelvis CT 09/27/2017. FINDINGS: CT CHEST FINDINGS Cardiovascular: Intact thoracic aorta. Calcified coronary artery and Calcified aortic atherosclerosis. Mild cardiomegaly appears stable. No pericardial effusion. Stable right chest porta cath. Mediastinum/Nodes: Negative for mediastinal lymphadenopathy or hematoma. Lungs/Pleura: Major airways are patent. Dependent atelectasis in both lungs  similar to that in 2019. Trace right pleural effusion/hemothorax. No right pneumothorax. Possible small lateral segment right middle lobe pulmonary contusion on series 4, image 103. Right lateral costophrenic angle atelectasis appears stable. No contralateral left pneumothorax, pleural effusion or pulmonary contusion. Musculoskeletal: Acute mildly displaced fractures of the right anterolateral 4th and lateral 7 ribs. A nondisplaced fracture of the right lateral 6th rib appears chronic. Nondisplaced fracture of the right lateral 5th rib is age indeterminate. There is a new nondisplaced fracture of the right lateral 9th rib. Stable chronic left anterior 3rd rib nondisplaced fracture. No acute left rib fracture identified. Sternum appears intact. Thoracic vertebrae appear stable. Visible shoulder osseous structures appear stable. Superficial right lateral chest wall stranding compatible with contusion or mild hematoma on series 3, image 41 overlying the 6th rib. There is mild intercostal space hematoma right anterolateral 3/4 interspace on series 3, image 26. CT ABDOMEN PELVIS FINDINGS Hepatobiliary: Stable coarsely calcified left hepatic mass, 7.7 centimeters. Stable liver parenchyma elsewhere with occasional subcentimeter low-density areas. Chronic cholecystectomy. Pancreas: Stable pancreatic atrophy. Spleen: Stable splenic heterogeneity with multiple predominantly subcentimeter hypodense areas. Adrenals/Urinary Tract: Negative adrenal glands. Chronic bilateral ureteral stents are in place with configuration not significantly changed since June 2019. Bilateral renal enhancement and contrast excretion appears symmetric and within normal limits. Stable renal parenchyma with cortical scarring. Mildly distended but otherwise unremarkable urinary bladder. Stomach/Bowel: Negative rectum. Sigmoid tortuosity and diverticulosis. Similar extensive diverticulosis in the descending and transverse colon. Negative right colon and  appendix. No dilated small bowel. Negative stomach. No free air or free fluid. Vascular/Lymphatic: Aortoiliac calcified atherosclerosis. Major arterial structures appear patent and intact. Portal venous system is patent. Reproductive: Stable, negative. Other: No pelvic free fluid. Musculoskeletal: Lumbar spine appears stable including lower lumbar spondylolisthesis and degenerative changes. Sacrum and SI joints appear intact. Subtle nondisplaced fracture through the posteromedial right iliac bone on series 3, image 97 and coronal image 128. The nearby right SI joint appears stable. No other acute pelvic fracture identified. No proximal femur fracture identified. No superficial soft tissue injury identified. IMPRESSION: 1. Acute non- to mildly displaced fractures of the right 4th, 7th, and 9th ribs 2. Trace right hemothorax, small right intercostal hematoma, and possible small lateral segment right middle lobe pulmonary contusion. 3. Acute nondisplaced fracture of the posteromedial right iliac bone. The nearby right SI joint appears stable. No other pelvic fracture identified. 4. No other acute traumatic injury identified. CT thoracic and lumbar spine are reported separately. 5. Stable chronic findings in the chest abdomen and pelvis including: Calcified left hepatic mass, small splenic lesions, bilateral ureteral stents, diverticulosis of the colon, Aortic Atherosclerosis (ICD10-I70.0). Electronically Signed   By: Genevie Ann M.D.   On: 07/02/2018 18:28   Ct Cervical Spine Wo Contrast  Result Date: 07/02/2018 CLINICAL DATA:  85 year old male run over by tractor. EXAM: CT HEAD WITHOUT CONTRAST CT CERVICAL SPINE WITHOUT CONTRAST TECHNIQUE: Multidetector CT imaging of the head and cervical spine was performed following the standard protocol without intravenous contrast. Multiplanar CT image reconstructions of the cervical spine were also generated. COMPARISON:  None available, a duplicate account in PACS is being  investigated and merge at this time. FINDINGS: CT HEAD  FINDINGS Brain: Cerebral volume loss appears generalized. Mild to moderate for age patchy white matter hypodensity. No midline shift, ventriculomegaly, mass effect, evidence of mass lesion, intracranial hemorrhage or evidence of cortically based acute infarction. No cortical encephalomalacia. Vascular: Extensive calcified atherosclerosis at the skull base. Mild intracranial artery tortuosity. Skull: Intact. Sinuses/Orbits: Paranasal sinuses and mastoids are well pneumatized. Other: No definite acute orbit or scalp soft tissue finding. CT CERVICAL SPINE FINDINGS Alignment: Straightening of cervical lordosis. Cervicothoracic junction alignment is within normal limits. Bilateral posterior element alignment is within normal limits. Skull base and vertebrae: Visualized skull base is intact. No atlanto-occipital dissociation. Osteopenia. No acute osseous abnormality identified. Soft tissues and spinal canal: No prevertebral fluid or swelling. No visible canal hematoma. Calcified cervical carotid atherosclerosis. Otherwise negative noncontrast neck soft tissues. Disc levels: Widespread cervical spine degeneration. Up to mild degenerative spinal stenosis at C4-C5. Upper chest: Grossly intact visible upper thoracic levels. Negative lung apices. IMPRESSION: 1. No acute traumatic injury identified in the head or cervical spine. 2. No acute intracranial abnormality. 3. Cervical spine degeneration. Extensive calcified atherosclerosis. Electronically Signed   By: Genevie Ann M.D.   On: 07/02/2018 17:55   Ct Abdomen Pelvis W Contrast  Result Date: 07/02/2018 CLINICAL DATA:  85 year old male run over by tractor. Follicular lymphoma. EXAM: CT CHEST, ABDOMEN, AND PELVIS WITH CONTRAST TECHNIQUE: Multidetector CT imaging of the chest, abdomen and pelvis was performed following the standard protocol during bolus administration of intravenous contrast. CONTRAST:  128mL OMNIPAQUE  IOHEXOL 300 MG/ML  SOLN COMPARISON:  PET-CT 04/26/2018. Chest abdomen and pelvis CT 09/27/2017. FINDINGS: CT CHEST FINDINGS Cardiovascular: Intact thoracic aorta. Calcified coronary artery and Calcified aortic atherosclerosis. Mild cardiomegaly appears stable. No pericardial effusion. Stable right chest porta cath. Mediastinum/Nodes: Negative for mediastinal lymphadenopathy or hematoma. Lungs/Pleura: Major airways are patent. Dependent atelectasis in both lungs similar to that in 2019. Trace right pleural effusion/hemothorax. No right pneumothorax. Possible small lateral segment right middle lobe pulmonary contusion on series 4, image 103. Right lateral costophrenic angle atelectasis appears stable. No contralateral left pneumothorax, pleural effusion or pulmonary contusion. Musculoskeletal: Acute mildly displaced fractures of the right anterolateral 4th and lateral 7 ribs. A nondisplaced fracture of the right lateral 6th rib appears chronic. Nondisplaced fracture of the right lateral 5th rib is age indeterminate. There is a new nondisplaced fracture of the right lateral 9th rib. Stable chronic left anterior 3rd rib nondisplaced fracture. No acute left rib fracture identified. Sternum appears intact. Thoracic vertebrae appear stable. Visible shoulder osseous structures appear stable. Superficial right lateral chest wall stranding compatible with contusion or mild hematoma on series 3, image 41 overlying the 6th rib. There is mild intercostal space hematoma right anterolateral 3/4 interspace on series 3, image 26. CT ABDOMEN PELVIS FINDINGS Hepatobiliary: Stable coarsely calcified left hepatic mass, 7.7 centimeters. Stable liver parenchyma elsewhere with occasional subcentimeter low-density areas. Chronic cholecystectomy. Pancreas: Stable pancreatic atrophy. Spleen: Stable splenic heterogeneity with multiple predominantly subcentimeter hypodense areas. Adrenals/Urinary Tract: Negative adrenal glands. Chronic  bilateral ureteral stents are in place with configuration not significantly changed since June 2019. Bilateral renal enhancement and contrast excretion appears symmetric and within normal limits. Stable renal parenchyma with cortical scarring. Mildly distended but otherwise unremarkable urinary bladder. Stomach/Bowel: Negative rectum. Sigmoid tortuosity and diverticulosis. Similar extensive diverticulosis in the descending and transverse colon. Negative right colon and appendix. No dilated small bowel. Negative stomach. No free air or free fluid. Vascular/Lymphatic: Aortoiliac calcified atherosclerosis. Major arterial structures appear patent and intact. Portal venous system  is patent. Reproductive: Stable, negative. Other: No pelvic free fluid. Musculoskeletal: Lumbar spine appears stable including lower lumbar spondylolisthesis and degenerative changes. Sacrum and SI joints appear intact. Subtle nondisplaced fracture through the posteromedial right iliac bone on series 3, image 97 and coronal image 128. The nearby right SI joint appears stable. No other acute pelvic fracture identified. No proximal femur fracture identified. No superficial soft tissue injury identified. IMPRESSION: 1. Acute non- to mildly displaced fractures of the right 4th, 7th, and 9th ribs 2. Trace right hemothorax, small right intercostal hematoma, and possible small lateral segment right middle lobe pulmonary contusion. 3. Acute nondisplaced fracture of the posteromedial right iliac bone. The nearby right SI joint appears stable. No other pelvic fracture identified. 4. No other acute traumatic injury identified. CT thoracic and lumbar spine are reported separately. 5. Stable chronic findings in the chest abdomen and pelvis including: Calcified left hepatic mass, small splenic lesions, bilateral ureteral stents, diverticulosis of the colon, Aortic Atherosclerosis (ICD10-I70.0). Electronically Signed   By: Genevie Ann M.D.   On: 07/02/2018 18:28    Dg Pelvis Portable  Result Date: 07/02/2018 CLINICAL DATA:  Ran over by tractor. EXAM: PORTABLE PELVIS 1-2 VIEWS COMPARISON:  None. FINDINGS: There is no evidence of pelvic fracture or diastasis. No pelvic bone lesions are seen. IMPRESSION: Negative. Electronically Signed   By: Marijo Conception, M.D.   On: 07/02/2018 17:16   Ct T-spine No Charge  Result Date: 07/02/2018 CLINICAL DATA:  85 year old male run over by tractor. Pain. EXAM: CT THORACIC SPINE WITH CONTRAST CT LUMBAR SPINE WITH CONTRAST TECHNIQUE: Multiplanar CT images of the thoracic and lumbar spine were reconstructed from contemporary CTs of the Chest abdomen, and pelvis. CONTRAST:  No additional. COMPARISON:  CT Chest, Abdomen, and Pelvis today are reported separately. CT Abdomen and Pelvis 09/27/2017. FINDINGS: THORACIC SPINE: Segmentation: Normal. Alignment: Preserved thoracic kyphosis. Vertebrae: Thoracic vertebrae appear intact. Right rib fractures are reported separately today. Paraspinal and other soft tissues: Reported separately today. Disc levels: No CT evidence of thoracic spinal stenosis and mild for age degenerative changes. LUMBAR SPINE: Segmentation: Normal. Alignment: Stable since 2019 with grade 1 anterolisthesis of L5 on S1. Vertebrae: Lumbar vertebrae appear stable and intact. Visible sacrum and SI joints appear stable and intact. Paraspinal and other soft tissues: Reported separately today. Disc levels: Widespread lumbar spine degeneration appears stable since 2019. IMPRESSION: 1. No acute osseous abnormality in the thoracic or lumbar spine. 2.  CT Chest, Abdomen, and Pelvis today are reported separately. Electronically Signed   By: Genevie Ann M.D.   On: 07/02/2018 18:20   Ct L-spine No Charge  Result Date: 07/02/2018 CLINICAL DATA:  85 year old male run over by tractor. Pain. EXAM: CT THORACIC SPINE WITH CONTRAST CT LUMBAR SPINE WITH CONTRAST TECHNIQUE: Multiplanar CT images of the thoracic and lumbar spine were reconstructed  from contemporary CTs of the Chest abdomen, and pelvis. CONTRAST:  No additional. COMPARISON:  CT Chest, Abdomen, and Pelvis today are reported separately. CT Abdomen and Pelvis 09/27/2017. FINDINGS: THORACIC SPINE: Segmentation: Normal. Alignment: Preserved thoracic kyphosis. Vertebrae: Thoracic vertebrae appear intact. Right rib fractures are reported separately today. Paraspinal and other soft tissues: Reported separately today. Disc levels: No CT evidence of thoracic spinal stenosis and mild for age degenerative changes. LUMBAR SPINE: Segmentation: Normal. Alignment: Stable since 2019 with grade 1 anterolisthesis of L5 on S1. Vertebrae: Lumbar vertebrae appear stable and intact. Visible sacrum and SI joints appear stable and intact. Paraspinal and other soft tissues: Reported separately  today. Disc levels: Widespread lumbar spine degeneration appears stable since 2019. IMPRESSION: 1. No acute osseous abnormality in the thoracic or lumbar spine. 2.  CT Chest, Abdomen, and Pelvis today are reported separately. Electronically Signed   By: Genevie Ann M.D.   On: 07/02/2018 18:20   Dg Chest Port 1 View  Result Date: 07/02/2018 CLINICAL DATA:  Run over by tractor. EXAM: PORTABLE CHEST 1 VIEW COMPARISON:  None. FINDINGS: The heart size and mediastinal contours are within normal limits. Both lungs are clear. No pneumothorax or pleural effusion is noted. Mildly displaced right seventh rib fracture. Right subclavian Port-A-Cath is noted with tip in right atrium. IMPRESSION: Mildly displaced right seventh rib fracture. No other definite traumatic abnormality seen. Electronically Signed   By: Marijo Conception, M.D.   On: 07/02/2018 17:15   Dg Knee Complete 4 Views Right  Result Date: 07/02/2018 CLINICAL DATA:  Run over by tractor.  Knee pain. EXAM: RIGHT KNEE - COMPLETE 4+ VIEW COMPARISON:  None. FINDINGS: No evidence of fracture, dislocation or joint effusion. Chronic age related chondrocalcinosis. Arterial calcification  throughout the region. IMPRESSION: Negative. Electronically Signed   By: Nelson Chimes M.D.   On: 07/02/2018 18:58    Pending Labs Unresulted Labs (From admission, onward)    Start     Ordered   07/03/18 0500  CBC  Tomorrow morning,   R     07/02/18 1939   07/03/18 6283  Basic metabolic panel  Tomorrow morning,   R     07/02/18 1939   07/02/18 1659  CDS serology  (Trauma Panel)  Once,   STAT     07/02/18 1658   07/02/18 1659  Urinalysis, Routine w reflex microscopic  (Trauma Panel)  ONCE - STAT,   STAT     07/02/18 [id number]          Vitals/Pain Today's Vitals   07/02/18 1815 07/02/18 1830 07/02/18 1915 07/02/18 1920  BP: (!) 126/57 (!) 119/56 (!) 99/47 (!) 116/55  Pulse: 74 70 65 66  Resp: 15 14 17 14   Temp:      TempSrc:      SpO2: 99% 96% 97% 99%  Weight:      Height:      PainSc:        Isolation Precautions No active isolations  Medications Medications  fentaNYL (SUBLIMAZE) injection 50 mcg (has no administration in time range)  acetaminophen (TYLENOL) tablet 650 mg (has no administration in time range)  morphine 2 MG/ML injection 2-4 mg (has no administration in time range)  docusate sodium (COLACE) capsule 100 mg (has no administration in time range)  dextrose 5 %-0.45 % sodium chloride infusion (has no administration in time range)  oxyCODONE (Oxy IR/ROXICODONE) immediate release tablet 5 mg (has no administration in time range)  ondansetron (ZOFRAN-ODT) disintegrating tablet 4 mg (has no administration in time range)    Or  ondansetron (ZOFRAN) injection 4 mg (has no administration in time range)  hydrALAZINE (APRESOLINE) injection 10 mg (has no administration in time range)  iohexol (OMNIPAQUE) 300 MG/ML solution 100 mL (100 mLs Intravenous Contrast Given 07/02/18 1727)    Mobility walks with device Moderate fall risk   Focused Assessments   R Recommendations: See Admitting Provider Note  Report given to:   Additional Notes:
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
## 2018-07-03 ENCOUNTER — Observation Stay (HOSPITAL_COMMUNITY): Payer: Medicare Other

## 2018-07-03 DIAGNOSIS — R310 Gross hematuria: Secondary | ICD-10-CM | POA: Diagnosis not present

## 2018-07-03 DIAGNOSIS — S32391A Other fracture of right ilium, initial encounter for closed fracture: Secondary | ICD-10-CM | POA: Diagnosis not present

## 2018-07-03 DIAGNOSIS — S32810A Multiple fractures of pelvis with stable disruption of pelvic ring, initial encounter for closed fracture: Secondary | ICD-10-CM | POA: Diagnosis not present

## 2018-07-03 DIAGNOSIS — Z7901 Long term (current) use of anticoagulants: Secondary | ICD-10-CM | POA: Diagnosis not present

## 2018-07-03 DIAGNOSIS — N131 Hydronephrosis with ureteral stricture, not elsewhere classified: Secondary | ICD-10-CM | POA: Diagnosis not present

## 2018-07-03 DIAGNOSIS — S2241XA Multiple fractures of ribs, right side, initial encounter for closed fracture: Secondary | ICD-10-CM | POA: Diagnosis not present

## 2018-07-03 DIAGNOSIS — D62 Acute posthemorrhagic anemia: Secondary | ICD-10-CM | POA: Diagnosis not present

## 2018-07-03 DIAGNOSIS — R0602 Shortness of breath: Secondary | ICD-10-CM | POA: Diagnosis not present

## 2018-07-03 LAB — BASIC METABOLIC PANEL
Anion gap: 7 (ref 5–15)
BUN: 26 mg/dL — ABNORMAL HIGH (ref 8–23)
CO2: 22 mmol/L (ref 22–32)
Calcium: 8.5 mg/dL — ABNORMAL LOW (ref 8.9–10.3)
Chloride: 109 mmol/L (ref 98–111)
Creatinine, Ser: 1.29 mg/dL — ABNORMAL HIGH (ref 0.61–1.24)
GFR calc Af Amer: 59 mL/min — ABNORMAL LOW (ref >60)
GFR calc non Af Amer: 51 mL/min — ABNORMAL LOW (ref >60)
Glucose, Bld: 141 mg/dL — ABNORMAL HIGH (ref 70–99)
Potassium: 3.6 mmol/L (ref 3.5–5.1)
Sodium: 138 mmol/L (ref 135–145)

## 2018-07-03 LAB — CBC
HCT: 23.4 % — ABNORMAL LOW (ref 39.0–52.0)
HCT: 28.7 % — ABNORMAL LOW (ref 39.0–52.0)
HEMOGLOBIN: 9.2 g/dL — AB (ref 13.0–17.0)
Hemoglobin: 7.1 g/dL — ABNORMAL LOW (ref 13.0–17.0)
MCH: 26 pg (ref 26.0–34.0)
MCH: 26.8 pg (ref 26.0–34.0)
MCHC: 30.3 g/dL (ref 30.0–36.0)
MCHC: 32.1 g/dL (ref 30.0–36.0)
MCV: 85.7 fL (ref 80.0–100.0)
MCV: 83.7 fL (ref 80.0–100.0)
Platelets: 107 10*3/uL — ABNORMAL LOW (ref 150–400)
Platelets: 107 10*3/uL — ABNORMAL LOW (ref 150–400)
RBC: 2.73 MIL/uL — ABNORMAL LOW (ref 4.22–5.81)
RBC: 3.43 MIL/uL — ABNORMAL LOW (ref 4.22–5.81)
RDW: 15.5 % (ref 11.5–15.5)
RDW: 15.5 % (ref 11.5–15.5)
WBC: 5.1 10*3/uL (ref 4.0–10.5)
WBC: 5.5 10*3/uL (ref 4.0–10.5)
nRBC: 0 % (ref 0.0–0.2)
nRBC: 0 % (ref 0.0–0.2)

## 2018-07-03 LAB — URINALYSIS, ROUTINE W REFLEX MICROSCOPIC
Bacteria, UA: NONE SEEN
Bilirubin Urine: NEGATIVE
GLUCOSE, UA: 50 mg/dL — AB
Ketones, ur: NEGATIVE mg/dL
NITRITE: NEGATIVE
Protein, ur: 30 mg/dL — AB
RBC / HPF: >50 RBC/hpf — ABNORMAL HIGH (ref 0–5)
Specific Gravity, Urine: 1.027 (ref 1.005–1.030)
WBC, UA: >50 WBC/hpf — ABNORMAL HIGH (ref 0–5)
pH: 5 (ref 5.0–8.0)

## 2018-07-03 LAB — PREPARE RBC (CROSSMATCH)

## 2018-07-03 LAB — BLOOD PRODUCT ORDER (VERBAL) VERIFICATION

## 2018-07-03 LAB — CBG MONITORING, ED
Glucose-Capillary: 94 mg/dL (ref 70–99)
Glucose-Capillary: 134 mg/dL — ABNORMAL HIGH (ref 70–99)

## 2018-07-03 LAB — GLUCOSE, CAPILLARY
GLUCOSE-CAPILLARY: 228 mg/dL — AB (ref 70–99)
Glucose-Capillary: 159 mg/dL — ABNORMAL HIGH (ref 70–99)
Glucose-Capillary: 225 mg/dL — ABNORMAL HIGH (ref 70–99)

## 2018-07-03 LAB — CDS SEROLOGY

## 2018-07-03 MED ORDER — ATORVASTATIN CALCIUM 10 MG PO TABS
20.0000 mg | ORAL_TABLET | Freq: Every day | ORAL | Status: DC
  Administered 2018-07-03 – 2018-07-05 (×3): 20 mg via ORAL
  Filled 2018-07-03 (×3): qty 2

## 2018-07-03 MED ORDER — INSULIN ASPART 100 UNIT/ML ~~LOC~~ SOLN
0.0000 [IU] | Freq: Three times a day (TID) | SUBCUTANEOUS | Status: DC
  Administered 2018-07-03: 2 [IU] via SUBCUTANEOUS
  Administered 2018-07-03: 5 [IU] via SUBCUTANEOUS
  Administered 2018-07-04: 8 [IU] via SUBCUTANEOUS
  Administered 2018-07-04 – 2018-07-05 (×3): 3 [IU] via SUBCUTANEOUS
  Administered 2018-07-05: 5 [IU] via SUBCUTANEOUS
  Administered 2018-07-05: 3 [IU] via SUBCUTANEOUS

## 2018-07-03 MED ORDER — SERTRALINE HCL 100 MG PO TABS
100.0000 mg | ORAL_TABLET | Freq: Every day | ORAL | Status: DC
  Administered 2018-07-03 – 2018-07-05 (×3): 100 mg via ORAL
  Filled 2018-07-03 (×3): qty 1

## 2018-07-03 MED ORDER — BACITRACIN ZINC 500 UNIT/GM EX OINT
TOPICAL_OINTMENT | Freq: Two times a day (BID) | CUTANEOUS | Status: DC
  Administered 2018-07-03 – 2018-07-05 (×5): via TOPICAL
  Filled 2018-07-03: qty 0.9
  Filled 2018-07-03: qty 28.4

## 2018-07-03 MED ORDER — FENOFIBRATE 160 MG PO TABS
160.0000 mg | ORAL_TABLET | Freq: Every day | ORAL | Status: DC
  Administered 2018-07-03 – 2018-07-05 (×3): 160 mg via ORAL
  Filled 2018-07-03 (×3): qty 1

## 2018-07-03 MED ORDER — SODIUM CHLORIDE 0.9% IV SOLUTION: Freq: Once | INTRAVENOUS | Status: DC

## 2018-07-03 MED ORDER — SODIUM CHLORIDE 0.9 % IV SOLN: 10.0000 mL/h | Freq: Once | INTRAVENOUS | Status: DC

## 2018-07-03 MED ORDER — LATANOPROST 0.005 % OP SOLN
1.0000 [drp] | Freq: Every day | OPHTHALMIC | Status: DC
  Administered 2018-07-03 – 2018-07-04 (×3): 1 [drp] via OPHTHALMIC
  Filled 2018-07-03 (×2): qty 2.5

## 2018-07-03 MED ORDER — PANCRELIPASE (LIP-PROT-AMYL) 12000-38000 UNITS PO CPEP
12000.0000 [IU] | ORAL_CAPSULE | Freq: Three times a day (TID) | ORAL | Status: DC
  Administered 2018-07-03 – 2018-07-05 (×7): 12000 [IU] via ORAL
  Filled 2018-07-03 (×9): qty 1

## 2018-07-03 MED ORDER — GABAPENTIN 300 MG PO CAPS
300.0000 mg | ORAL_CAPSULE | Freq: Three times a day (TID) | ORAL | Status: DC
  Administered 2018-07-03 – 2018-07-05 (×7): 300 mg via ORAL
  Filled 2018-07-03 (×7): qty 1

## 2018-07-03 MED ORDER — ORAL CARE MOUTH RINSE
15.0000 mL | Freq: Two times a day (BID) | OROMUCOSAL | Status: DC
  Administered 2018-07-03 – 2018-07-05 (×4): 15 mL via OROMUCOSAL

## 2018-07-03 MED ORDER — SODIUM CHLORIDE 0.9 % IV BOLUS
1000.0000 mL | Freq: Once | INTRAVENOUS | Status: AC
  Administered 2018-07-03: 1000 mL via INTRAVENOUS

## 2018-07-03 MED ORDER — CYCLOBENZAPRINE HCL 10 MG PO TABS
5.0000 mg | ORAL_TABLET | Freq: Every day | ORAL | Status: DC | PRN
  Administered 2018-07-04: 5 mg via ORAL
  Filled 2018-07-03: qty 1

## 2018-07-03 NOTE — Consult Note (Signed)
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 within normal limits. Stable renal parenchyma with cortical scarring. Mildly distended but otherwise unremarkable urinary bladder. Stomach/Bowel: Negative rectum. Sigmoid tortuosity and diverticulosis. Similar extensive diverticulosis in the descending and transverse colon. Negative right colon and appendix. No dilated small bowel. Negative stomach. No free air or free fluid. Vascular/Lymphatic: Aortoiliac calcified atherosclerosis. Major arterial structures appear patent and intact. Portal venous system is patent. Reproductive: Stable, negative. Other: No pelvic free fluid. Musculoskeletal: Lumbar spine appears stable including lower lumbar spondylolisthesis and degenerative changes. Sacrum and SI joints appear intact. Subtle nondisplaced fracture through the posteromedial right iliac bone on series 3, image 97 and coronal image 128. The nearby right SI joint appears stable. No other acute pelvic fracture identified. No proximal femur fracture identified. No superficial soft tissue injury  identified. IMPRESSION: 1. Acute non- to mildly displaced fractures of the right 4th, 7th, and 9th ribs 2. Trace right hemothorax, small right intercostal hematoma, and possible small lateral segment right middle lobe pulmonary contusion. 3. Acute nondisplaced fracture of the posteromedial right iliac bone. The nearby right SI joint appears stable. No other pelvic fracture identified. 4. No other acute traumatic injury identified. CT thoracic and lumbar spine are reported separately. 5. Stable chronic findings in the chest abdomen and pelvis including: Calcified left hepatic mass, small splenic lesions, bilateral ureteral stents, diverticulosis of the colon, Aortic Atherosclerosis (ICD10-I70.0). Electronically Signed   By: Genevie Ann M.D.   On: 07/02/2018 18:28   Dg Pelvis Portable  Result Date: 07/02/2018 CLINICAL DATA:  Ran over by tractor. EXAM: PORTABLE PELVIS 1-2 VIEWS COMPARISON:  None. FINDINGS: There is no evidence of pelvic fracture or diastasis. No pelvic bone lesions are seen. IMPRESSION: Negative. Electronically Signed   By: Marijo Conception, M.D.   On: 07/02/2018 17:16   Ct T-spine No Charge  Result Date: 07/02/2018 CLINICAL DATA:  85 year old male run over by tractor. Pain. EXAM: CT THORACIC SPINE WITH CONTRAST CT LUMBAR SPINE WITH CONTRAST TECHNIQUE: Multiplanar CT images of the thoracic and lumbar spine were reconstructed from contemporary CTs of the Chest abdomen, and pelvis. CONTRAST:  No additional. COMPARISON:  CT Chest, Abdomen, and Pelvis today are reported separately. CT Abdomen and Pelvis 09/27/2017. FINDINGS: THORACIC SPINE: Segmentation: Normal. Alignment: Preserved thoracic kyphosis. Vertebrae: Thoracic vertebrae appear intact. Right rib fractures are reported separately today. Paraspinal and other soft tissues: Reported separately today. Disc levels: No CT evidence of thoracic spinal stenosis and mild for age degenerative changes. LUMBAR SPINE: Segmentation: Normal. Alignment: Stable  since 2019 with grade 1 anterolisthesis of L5 on S1. Vertebrae: Lumbar vertebrae appear stable and intact. Visible sacrum and SI joints appear stable and intact. Paraspinal and other soft tissues: Reported separately today. Disc levels: Widespread lumbar spine degeneration appears stable since 2019. IMPRESSION: 1. No acute osseous abnormality in the thoracic or lumbar spine. 2.  CT Chest, Abdomen, and Pelvis today are reported separately. Electronically Signed   By: Genevie Ann M.D.   On: 07/02/2018 18:20   Ct L-spine No Charge  Result Date: 07/02/2018 CLINICAL DATA:  85 year old male run over by tractor. Pain.  EXAM: CT THORACIC SPINE WITH CONTRAST CT LUMBAR SPINE WITH CONTRAST TECHNIQUE: Multiplanar CT images of the thoracic and lumbar spine were reconstructed from contemporary CTs of the Chest abdomen, and pelvis. CONTRAST:  No additional. COMPARISON:  CT Chest, Abdomen, and Pelvis today are reported separately. CT Abdomen and Pelvis 09/27/2017. FINDINGS: THORACIC SPINE: Segmentation: Normal. Alignment: Preserved thoracic kyphosis. Vertebrae: Thoracic vertebrae appear intact. Right rib fractures are reported separately today. Paraspinal and other soft tissues: Reported separately today. Disc levels: No CT evidence of thoracic spinal stenosis and mild for age degenerative changes. LUMBAR SPINE: Segmentation: Normal. Alignment: Stable since 2019 with grade 1 anterolisthesis of L5 on S1. Vertebrae: Lumbar vertebrae appear stable and intact. Visible sacrum and SI joints appear stable and intact. Paraspinal and other soft tissues: Reported separately today. Disc levels: Widespread lumbar spine degeneration appears stable since 2019. IMPRESSION: 1. No acute osseous abnormality in the thoracic or lumbar spine. 2.  CT Chest, Abdomen, and Pelvis today are reported separately. Electronically Signed   By: Genevie Ann M.D.   On: 07/02/2018 18:20   Dg Chest Port 1 View  Result Date: 07/03/2018 CLINICAL DATA:  Shortness of  breath EXAM: PORTABLE CHEST 1 VIEW COMPARISON:  07/02/2018 FINDINGS: Cardiac shadow is stable. Right chest wall port is again noted and stable. Aortic calcifications are again seen. The overall inspiratory effort is poor with mild bibasilar atelectasis. No pneumothorax is identified. The known rib fractures on the right are not well appreciated. IMPRESSION: Mild bibasilar atelectasis. No sizable pneumothorax is noted. Electronically Signed   By: Inez Catalina M.D.   On: 07/03/2018 06:11   Dg Chest Port 1 View  Result Date: 07/02/2018 CLINICAL DATA:  Run over by tractor. EXAM: PORTABLE CHEST 1 VIEW COMPARISON:  None. FINDINGS: The heart size and mediastinal contours are within normal limits. Both lungs are clear. No pneumothorax or pleural effusion is noted. Mildly displaced right seventh rib fracture. Right subclavian Port-A-Cath is noted with tip in right atrium. IMPRESSION: Mildly displaced right seventh rib fracture. No other definite traumatic abnormality seen. Electronically Signed   By: Marijo Conception, M.D.   On: 07/02/2018 17:15   Dg Knee Complete 4 Views Right  Result Date: 07/02/2018 CLINICAL DATA:  Run over by tractor.  Knee pain. EXAM: RIGHT KNEE - COMPLETE 4+ VIEW COMPARISON:  None. FINDINGS: No evidence of fracture, dislocation or joint effusion. Chronic age related chondrocalcinosis. Arterial calcification throughout the region. IMPRESSION: Negative. Electronically Signed   By: Nelson Chimes M.D.   On: 07/02/2018 18:58     Impression/Recommendation  Hematuria--chronic due to stents in pt on anticoagulation.  CT A/P confirms no GU injury from trauma. Monitor string of bottles to make sure it clears/improves.  Push IVF.  UA does not appear infected but will check culture to be complete.   ABL anemia--pt is currently being transfused.   Debbrah Alar 07/03/2018, 11:06 AM

## 2018-07-03 NOTE — ED Notes (Signed)
ED TO INPATIENT HANDOFF REPORT  ED Nurse Name and Phone #: [phone]  S Name/Age/Gender [name] 85 y.o. male Room/Bed: 034C/034C  Code Status   Code Status: Full Code  Home/SNF/Other Home Patient oriented to: self, place, time and situation Is this baseline? Yes   Triage Complete: Triage complete  Chief Complaint L2  Triage Note No notes on file   Allergies Allergies  Allergen Reactions  . Atenolol Other (See Comments)    "Heart rate slowed- STOPPED on 08/16/2013"  . Niacin And Related Other (See Comments)    Headache     Level of Care/Admitting Diagnosis ED Disposition    ED Disposition Condition [facility]: [place] [[id number]]  Level of Care: Progressive [102]  Diagnosis: Rib fractures [347425]  Admitting Physician: TRAUMA MD [place]  Attending Physician: TRAUMA MD [[id number]]  Bed request comments: 4N Progressive Care  PT Class (Do Not Modify): Observation [104]  PT Acc Code (Do Not Modify): Observation [10022]       B Medical/Surgery History Past Medical History:  Diagnosis Date  . A-fib ([place])   . Lymphoma ([place])    Non-hodgkins  . Ureteral stent retained    History reviewed. No pertinent surgical history.   A IV Location/Drains/Wounds Patient Lines/Drains/Airways Status   Active Line/Drains/Airways    Name:   Placement date:   Placement time:   Site:   Days:   Peripheral IV 07/02/18 Left Antecubital   07/02/18    1650    Antecubital   1          Intake/Output Last 24 hours  Intake/Output Summary (Last 24 hours) at 07/03/2018 1558 Last data filed at 07/03/2018 1204 Gross per 24 hour  Intake 3315 ml  Output 500 ml  Net 2815 ml    Labs/Imaging Results for orders placed or performed during the hospital encounter of 07/02/18 (from the past 48 hour(s))  Prepare fresh frozen plasma     Status: None   Collection Time: 07/02/18  4:40 PM  Result Value Ref Range   Unit Number [id number]    Blood  Component Type LIQ PLASMA    Unit division 00    Status of Unit REL FROM [facility]    Unit tag comment EMERGENCY RELEASE    Transfusion Status      OK TO TRANSFUSE Performed at [facility], [address] [place], [place] [address]    Unit Number [id number]    Blood Component Type LIQ PLASMA    Unit division 00    Status of Unit REL FROM [facility]    Unit tag comment EMERGENCY RELEASE    Transfusion Status OK TO TRANSFUSE   Type and screen Ordered by PROVIDER DEFAULT     Status: None (Preliminary result)   Collection Time: 07/02/18  4:55 PM  Result Value Ref Range   ABO/RH(D) A POS    Antibody Screen NEG    Sample Expiration 07/05/2018    Unit Number [id number]    Blood Component Type RED CELLS,LR    Unit division 00    Status of Unit REL FROM [facility]    Unit tag comment EMERGENCY RELEASE    Transfusion Status OK TO TRANSFUSE    Crossmatch Result NOT NEEDED    Unit Number [id number]    Blood Component Type RED CELLS,LR    Unit division 00    Status of Unit REL FROM [facility]    Unit tag comment EMERGENCY RELEASE  Transfusion Status OK TO TRANSFUSE    Crossmatch Result NOT NEEDED    Unit Number [id number]    Blood Component Type RED CELLS,LR    Unit division 00    Status of Unit ISSUED    Transfusion Status OK TO TRANSFUSE    Crossmatch Result Compatible    Unit Number [id number]    Blood Component Type RED CELLS,LR    Unit division 00    Status of Unit ISSUED    Transfusion Status OK TO TRANSFUSE    Crossmatch Result      Compatible Performed at [facility], [place] [address] [place], [place] [address]   ABO/Rh     Status: None   Collection Time: 07/02/18  4:55 PM  Result Value Ref Range   ABO/RH(D)      A POS Performed at [place] [address] [place], [place] [address]   CDS serology     Status: None   Collection Time: 07/02/18  4:59 PM  Result Value Ref Range   CDS serology specimen      SPECIMEN WILL BE HELD FOR 14 DAYS IF  TESTING IS REQUIRED    Comment: Performed at [facility], [place] [address] [place], [place] [address]  Comprehensive metabolic panel     Status: Abnormal   Collection Time: 07/02/18  4:59 PM  Result Value Ref Range   Sodium 137 135 - 145 mmol/L   Potassium 3.4 (L) 3.5 - 5.1 mmol/L   Chloride 106 98 - 111 mmol/L   CO2 23 22 - 32 mmol/L   Glucose, Bld 204 (H) 70 - 99 mg/dL   BUN 35 (H) 8 - 23 mg/dL   Creatinine, Ser 1.78 (H) 0.61 - 1.24 mg/dL   Calcium 8.9 8.9 - 10.3 mg/dL   Total Protein 5.5 (L) 6.5 - 8.1 g/dL   Albumin 3.4 (L) 3.5 - 5.0 g/dL   AST 37 15 - 41 U/L   ALT 24 0 - 44 U/L   Alkaline Phosphatase 48 38 - 126 U/L   Total Bilirubin 0.4 0.3 - 1.2 mg/dL   GFR calc non Af Amer 34 (L) >60 mL/min   GFR calc Af Amer 40 (L) >60 mL/min   Anion gap 8 5 - 15    Comment: Performed at [facility], [place] [address], [place], [place] [address]  CBC     Status: Abnormal   Collection Time: 07/02/18  4:59 PM  Result Value Ref Range   WBC 5.9 4.0 - 10.5 K/uL   RBC 3.31 (L) 4.22 - 5.81 MIL/uL   Hemoglobin 8.7 (L) 13.0 - 17.0 g/dL   HCT 28.3 (L) 39.0 - 52.0 %   MCV 85.5 80.0 - 100.0 fL   MCH 26.3 26.0 - 34.0 pg   MCHC 30.7 30.0 - 36.0 g/dL   RDW 15.4 11.5 - 15.5 %   Platelets 144 (L) 150 - 400 K/uL   nRBC 0.0 0.0 - 0.2 %    Comment: Performed at [facility], [place] [address] [place], [place] [address]  Ethanol     Status: None   Collection Time: 07/02/18  4:59 PM  Result Value Ref Range   Alcohol, Ethyl (B) <10 <10 mg/dL    Comment: (NOTE) Lowest detectable limit for serum alcohol is 10 mg/dL. For medical purposes only. Performed at [facility], [place] [address], [place], [place] [address]   Lactic acid, plasma  Status: None   Collection Time: 07/02/18  4:59 PM  Result Value Ref Range   Lactic Acid, Venous 1.5 0.5 - 1.9 mmol/L    Comment: Performed at [facility], [place] [address] [place], [place] [address]  Protime-INR     Status: Abnormal    Collection Time: 07/02/18  4:59 PM  Result Value Ref Range   Prothrombin Time 27.5 (H) 11.4 - 15.2 seconds   INR 2.6 (H) 0.8 - 1.2    Comment: (NOTE) INR goal varies based on device and disease states. Performed at [facility], [place] [address], [place], [place] [address]   POCT I-Stat EG7     Status: Abnormal   Collection Time: 07/02/18  5:01 PM  Result Value Ref Range   pH, Ven 7.463 (H) 7.250 - 7.430   pCO2, Ven 31.7 (L) 44.0 - 60.0 mmHg   pO2, Ven 42.0 32.0 - 45.0 mmHg   Bicarbonate 22.7 20.0 - 28.0 mmol/L   TCO2 24 22 - 32 mmol/L   O2 Saturation 81.0 %   Acid-base deficit 1.0 0.0 - 2.0 mmol/L   Sodium 139 135 - 145 mmol/L   Potassium 3.5 3.5 - 5.1 mmol/L   Calcium, Ion 1.13 (L) 1.15 - 1.40 mmol/L   HCT 27.0 (L) 39.0 - 52.0 %   Hemoglobin 9.2 (L) 13.0 - 17.0 g/dL   Patient temperature HIDE    Sample type VENOUS   I-stat Creatinine, ED     Status: Abnormal   Collection Time: 07/02/18  5:01 PM  Result Value Ref Range   Creatinine, Ser 1.80 (H) 0.61 - 1.24 mg/dL  CBC     Status: Abnormal   Collection Time: 07/03/18  3:13 AM  Result Value Ref Range   WBC 5.1 4.0 - 10.5 K/uL   RBC 2.73 (L) 4.22 - 5.81 MIL/uL   Hemoglobin 7.1 (L) 13.0 - 17.0 g/dL    Comment: REPEATED TO VERIFY DELTA CHECK NOTED    HCT 23.4 (L) 39.0 - 52.0 %   MCV 85.7 80.0 - 100.0 fL   MCH 26.0 26.0 - 34.0 pg   MCHC 30.3 30.0 - 36.0 g/dL   RDW 15.5 11.5 - 15.5 %   Platelets 107 (L) 150 - 400 K/uL    Comment: REPEATED TO VERIFY PLATELET COUNT CONFIRMED BY SMEAR Immature Platelet Fraction may be clinically indicated, consider ordering this additional test [id number]    nRBC 0.0 0.0 - 0.2 %    Comment: Performed at [facility], [place] [address] [place], [place] [address]  Basic metabolic panel     Status: Abnormal   Collection Time: 07/03/18  3:13 AM  Result Value Ref Range   Sodium 138 135 - 145 mmol/L   Potassium 3.6 3.5 - 5.1 mmol/L   Chloride 109 98 - 111 mmol/L   CO2 22 22 - 32  mmol/L   Glucose, Bld 141 (H) 70 - 99 mg/dL   BUN 26 (H) 8 - 23 mg/dL   Creatinine, Ser 1.29 (H) 0.61 - 1.24 mg/dL   Calcium 8.5 (L) 8.9 - 10.3 mg/dL   GFR calc non Af Amer 51 (L) >60 mL/min   GFR calc Af Amer 59 (L) >60 mL/min   Anion gap 7 5 - 15    Comment: Performed at [place] [address] [place], [place] [address]  Urinalysis, Routine w reflex microscopic     Status: Abnormal   Collection Time: 07/03/18  3:18 AM  Result Value Ref Range   Color, Urine YELLOW YELLOW   APPearance CLOUDY (A) CLEAR   Specific Gravity, Urine 1.027 1.005 - 1.030   pH 5.0 5.0 - 8.0   Glucose, UA 50 (A) NEGATIVE mg/dL   Hgb urine dipstick LARGE (A) NEGATIVE   Bilirubin Urine NEGATIVE NEGATIVE   Ketones, ur NEGATIVE NEGATIVE mg/dL   Protein, ur 30 (A) NEGATIVE mg/dL   Nitrite NEGATIVE NEGATIVE   Leukocytes,Ua LARGE (A) NEGATIVE   RBC / HPF >50 (H) 0 - 5 RBC/hpf   WBC, UA >50 (H) 0 - 5 WBC/hpf   Bacteria, UA NONE SEEN NONE SEEN    Comment: Performed at [facility] [place] [address] [place], [place] [address]  Prepare RBC     Status: None   Collection Time: 07/03/18  5:42 AM  Result Value Ref Range   Order Confirmation      ORDER PROCESSED BY BLOOD BANK Performed at [facility], [place] [address] [place], [place] [address]   Provider-confirm verbal Blood Bank order - RBC, FFP, Type & Screen; 2 Units; Order taken: 07/02/2018; 4:46 PM; Level 1 Trauma, Emergency Release, STAT 2 units of O positive red cells and 2 units of A plasmas emergency released to the ER @ 1650. All ...     Status: None   Collection Time: 07/03/18  7:28 AM  Result Value Ref Range   Blood product order confirm      MD AUTHORIZATION REQUESTED Performed at [place] [address], [place],  [address]   CBG monitoring, ED     Status: None   Collection Time: 07/03/18  7:43 AM  Result Value Ref Range   Glucose-Capillary 94 70 - 99 mg/dL  Prepare RBC     Status: None   Collection Time:  07/03/18  8:27 AM  Result Value Ref Range   Order Confirmation      ORDER PROCESSED BY BLOOD BANK Performed at [facility], [place] [address], [place],  [address]   CBG monitoring, ED     Status: Abnormal   Collection Time: 07/03/18 11:54 AM  Result Value Ref Range   Glucose-Capillary 134 (H) 70 - 99 mg/dL   Dg Forearm Left  Result Date: 07/02/2018 CLINICAL DATA:  Run over by tractor.  Pain. EXAM: LEFT FOREARM - 2 VIEW COMPARISON:  None. FINDINGS: No evidence of fracture or dislocation. Regional arterial calcification incidentally noted. IMPRESSION: Negative. Electronically Signed   By: Nelson Chimes M.D.   On: 07/02/2018 18:59   Dg Forearm Right  Result Date: 07/02/2018 CLINICAL DATA:  Run over by tractor.  Pain. EXAM: RIGHT FOREARM - 2 VIEW COMPARISON:  None. FINDINGS: There is no evidence of fracture or other focal bone lesions. Soft tissues are unremarkable. Incidental age related arterial calcification. IMPRESSION: Negative. Electronically Signed   By: Nelson Chimes M.D.   On: 07/02/2018 19:00   Ct Head Wo Contrast  Result Date: 07/02/2018 CLINICAL DATA:  85 year old male run over by tractor. EXAM: CT HEAD WITHOUT CONTRAST CT CERVICAL SPINE WITHOUT CONTRAST TECHNIQUE: Multidetector CT imaging of the head and cervical spine was performed following the standard protocol without intravenous contrast. Multiplanar CT image reconstructions of the cervical spine were also generated. COMPARISON:  None available, a duplicate account in PACS is being investigated and merge at this time. FINDINGS: CT HEAD FINDINGS Brain: Cerebral volume loss appears generalized. Mild to moderate for age patchy white matter hypodensity. No midline shift,  ventriculomegaly, mass effect, evidence of mass lesion, intracranial hemorrhage or evidence of cortically based acute infarction. No cortical encephalomalacia. Vascular: Extensive calcified atherosclerosis at the skull base. Mild intracranial artery tortuosity.  Skull: Intact. Sinuses/Orbits: Paranasal sinuses and mastoids are well pneumatized. Other: No definite acute orbit or scalp soft tissue finding. CT CERVICAL SPINE FINDINGS Alignment: Straightening of cervical lordosis. Cervicothoracic junction alignment is within normal limits. Bilateral posterior element alignment is within normal limits. Skull base and vertebrae: Visualized skull base is intact. No atlanto-occipital dissociation. Osteopenia. No acute osseous abnormality identified. Soft tissues and spinal canal: No prevertebral fluid or swelling. No visible canal hematoma. Calcified cervical carotid atherosclerosis. Otherwise negative noncontrast neck soft tissues. Disc levels: Widespread cervical spine degeneration. Up to mild degenerative spinal stenosis at C4-C5. Upper chest: Grossly intact visible upper thoracic levels. Negative lung apices. IMPRESSION: 1. No acute traumatic injury identified in the head or cervical spine. 2. No acute intracranial abnormality. 3. Cervical spine degeneration. Extensive calcified atherosclerosis. Electronically Signed   By: Genevie Ann M.D.   On: 07/02/2018 17:55   Ct Chest W Contrast  Result Date: 07/02/2018 CLINICAL DATA:  85 year old male run over by tractor. Follicular lymphoma. EXAM: CT CHEST, ABDOMEN, AND PELVIS WITH CONTRAST TECHNIQUE: Multidetector CT imaging of the chest, abdomen and pelvis was performed following the standard protocol during bolus administration of intravenous contrast. CONTRAST:  182mL OMNIPAQUE IOHEXOL 300 MG/ML  SOLN COMPARISON:  PET-CT 04/26/2018. Chest abdomen and pelvis CT 09/27/2017. FINDINGS: CT CHEST FINDINGS Cardiovascular: Intact thoracic aorta. Calcified coronary artery and Calcified aortic atherosclerosis. Mild cardiomegaly appears stable. No pericardial effusion. Stable right chest porta cath. Mediastinum/Nodes: Negative for mediastinal lymphadenopathy or hematoma. Lungs/Pleura: Major airways are patent. Dependent atelectasis in both lungs  similar to that in 2019. Trace right pleural effusion/hemothorax. No right pneumothorax. Possible small lateral segment right middle lobe pulmonary contusion on series 4, image 103. Right lateral costophrenic angle atelectasis appears stable. No contralateral left pneumothorax, pleural effusion or pulmonary contusion. Musculoskeletal: Acute mildly displaced fractures of the right anterolateral 4th and lateral 7 ribs. A nondisplaced fracture of the right lateral 6th rib appears chronic. Nondisplaced fracture of the right lateral 5th rib is age indeterminate. There is a new nondisplaced fracture of the right lateral 9th rib. Stable chronic left anterior 3rd rib nondisplaced fracture. No acute left rib fracture identified. Sternum appears intact. Thoracic vertebrae appear stable. Visible shoulder osseous structures appear stable. Superficial right lateral chest wall stranding compatible with contusion or mild hematoma on series 3, image 41 overlying the 6th rib. There is mild intercostal space hematoma right anterolateral 3/4 interspace on series 3, image 26. CT ABDOMEN PELVIS FINDINGS Hepatobiliary: Stable coarsely calcified left hepatic mass, 7.7 centimeters. Stable liver parenchyma elsewhere with occasional subcentimeter low-density areas. Chronic cholecystectomy. Pancreas: Stable pancreatic atrophy. Spleen: Stable splenic heterogeneity with multiple predominantly subcentimeter hypodense areas. Adrenals/Urinary Tract: Negative adrenal glands. Chronic bilateral ureteral stents are in place with configuration not significantly changed since June 2019. Bilateral renal enhancement and contrast excretion appears symmetric and within normal limits. Stable renal parenchyma with cortical scarring. Mildly distended but otherwise unremarkable urinary bladder. Stomach/Bowel: Negative rectum. Sigmoid tortuosity and diverticulosis. Similar extensive diverticulosis in the descending and transverse colon. Negative right colon and  appendix. No dilated small bowel. Negative stomach. No free air or free fluid. Vascular/Lymphatic: Aortoiliac calcified atherosclerosis. Major arterial structures appear patent and intact. Portal venous system is patent. Reproductive: Stable, negative. Other: No pelvic free fluid. Musculoskeletal: Lumbar spine appears stable including lower lumbar spondylolisthesis  and degenerative changes. Sacrum and SI joints appear intact. Subtle nondisplaced fracture through the posteromedial right iliac bone on series 3, image 97 and coronal image 128. The nearby right SI joint appears stable. No other acute pelvic fracture identified. No proximal femur fracture identified. No superficial soft tissue injury identified. IMPRESSION: 1. Acute non- to mildly displaced fractures of the right 4th, 7th, and 9th ribs 2. Trace right hemothorax, small right intercostal hematoma, and possible small lateral segment right middle lobe pulmonary contusion. 3. Acute nondisplaced fracture of the posteromedial right iliac bone. The nearby right SI joint appears stable. No other pelvic fracture identified. 4. No other acute traumatic injury identified. CT thoracic and lumbar spine are reported separately. 5. Stable chronic findings in the chest abdomen and pelvis including: Calcified left hepatic mass, small splenic lesions, bilateral ureteral stents, diverticulosis of the colon, Aortic Atherosclerosis (ICD10-I70.0). Electronically Signed   By: Genevie Ann M.D.   On: 07/02/2018 18:28   Ct Cervical Spine Wo Contrast  Result Date: 07/02/2018 CLINICAL DATA:  85 year old male run over by tractor. EXAM: CT HEAD WITHOUT CONTRAST CT CERVICAL SPINE WITHOUT CONTRAST TECHNIQUE: Multidetector CT imaging of the head and cervical spine was performed following the standard protocol without intravenous contrast. Multiplanar CT image reconstructions of the cervical spine were also generated. COMPARISON:  None available, a duplicate account in PACS is being  investigated and merge at this time. FINDINGS: CT HEAD FINDINGS Brain: Cerebral volume loss appears generalized. Mild to moderate for age patchy white matter hypodensity. No midline shift, ventriculomegaly, mass effect, evidence of mass lesion, intracranial hemorrhage or evidence of cortically based acute infarction. No cortical encephalomalacia. Vascular: Extensive calcified atherosclerosis at the skull base. Mild intracranial artery tortuosity. Skull: Intact. Sinuses/Orbits: Paranasal sinuses and mastoids are well pneumatized. Other: No definite acute orbit or scalp soft tissue finding. CT CERVICAL SPINE FINDINGS Alignment: Straightening of cervical lordosis. Cervicothoracic junction alignment is within normal limits. Bilateral posterior element alignment is within normal limits. Skull base and vertebrae: Visualized skull base is intact. No atlanto-occipital dissociation. Osteopenia. No acute osseous abnormality identified. Soft tissues and spinal canal: No prevertebral fluid or swelling. No visible canal hematoma. Calcified cervical carotid atherosclerosis. Otherwise negative noncontrast neck soft tissues. Disc levels: Widespread cervical spine degeneration. Up to mild degenerative spinal stenosis at C4-C5. Upper chest: Grossly intact visible upper thoracic levels. Negative lung apices. IMPRESSION: 1. No acute traumatic injury identified in the head or cervical spine. 2. No acute intracranial abnormality. 3. Cervical spine degeneration. Extensive calcified atherosclerosis. Electronically Signed   By: Genevie Ann M.D.   On: 07/02/2018 17:55   Ct Abdomen Pelvis W Contrast  Result Date: 07/02/2018 CLINICAL DATA:  85 year old male run over by tractor. Follicular lymphoma. EXAM: CT CHEST, ABDOMEN, AND PELVIS WITH CONTRAST TECHNIQUE: Multidetector CT imaging of the chest, abdomen and pelvis was performed following the standard protocol during bolus administration of intravenous contrast. CONTRAST:  191mL OMNIPAQUE  IOHEXOL 300 MG/ML  SOLN COMPARISON:  PET-CT 04/26/2018. Chest abdomen and pelvis CT 09/27/2017. FINDINGS: CT CHEST FINDINGS Cardiovascular: Intact thoracic aorta. Calcified coronary artery and Calcified aortic atherosclerosis. Mild cardiomegaly appears stable. No pericardial effusion. Stable right chest porta cath. Mediastinum/Nodes: Negative for mediastinal lymphadenopathy or hematoma. Lungs/Pleura: Major airways are patent. Dependent atelectasis in both lungs similar to that in 2019. Trace right pleural effusion/hemothorax. No right pneumothorax. Possible small lateral segment right middle lobe pulmonary contusion on series 4, image 103. Right lateral costophrenic angle atelectasis appears stable. No contralateral left pneumothorax, pleural effusion  or pulmonary contusion. Musculoskeletal: Acute mildly displaced fractures of the right anterolateral 4th and lateral 7 ribs. A nondisplaced fracture of the right lateral 6th rib appears chronic. Nondisplaced fracture of the right lateral 5th rib is age indeterminate. There is a new nondisplaced fracture of the right lateral 9th rib. Stable chronic left anterior 3rd rib nondisplaced fracture. No acute left rib fracture identified. Sternum appears intact. Thoracic vertebrae appear stable. Visible shoulder osseous structures appear stable. Superficial right lateral chest wall stranding compatible with contusion or mild hematoma on series 3, image 41 overlying the 6th rib. There is mild intercostal space hematoma right anterolateral 3/4 interspace on series 3, image 26. CT ABDOMEN PELVIS FINDINGS Hepatobiliary: Stable coarsely calcified left hepatic mass, 7.7 centimeters. Stable liver parenchyma elsewhere with occasional subcentimeter low-density areas. Chronic cholecystectomy. Pancreas: Stable pancreatic atrophy. Spleen: Stable splenic heterogeneity with multiple predominantly subcentimeter hypodense areas. Adrenals/Urinary Tract: Negative adrenal glands. Chronic  bilateral ureteral stents are in place with configuration not significantly changed since June 2019. Bilateral renal enhancement and contrast excretion appears symmetric and within normal limits. Stable renal parenchyma with cortical scarring. Mildly distended but otherwise unremarkable urinary bladder. Stomach/Bowel: Negative rectum. Sigmoid tortuosity and diverticulosis. Similar extensive diverticulosis in the descending and transverse colon. Negative right colon and appendix. No dilated small bowel. Negative stomach. No free air or free fluid. Vascular/Lymphatic: Aortoiliac calcified atherosclerosis. Major arterial structures appear patent and intact. Portal venous system is patent. Reproductive: Stable, negative. Other: No pelvic free fluid. Musculoskeletal: Lumbar spine appears stable including lower lumbar spondylolisthesis and degenerative changes. Sacrum and SI joints appear intact. Subtle nondisplaced fracture through the posteromedial right iliac bone on series 3, image 97 and coronal image 128. The nearby right SI joint appears stable. No other acute pelvic fracture identified. No proximal femur fracture identified. No superficial soft tissue injury identified. IMPRESSION: 1. Acute non- to mildly displaced fractures of the right 4th, 7th, and 9th ribs 2. Trace right hemothorax, small right intercostal hematoma, and possible small lateral segment right middle lobe pulmonary contusion. 3. Acute nondisplaced fracture of the posteromedial right iliac bone. The nearby right SI joint appears stable. No other pelvic fracture identified. 4. No other acute traumatic injury identified. CT thoracic and lumbar spine are reported separately. 5. Stable chronic findings in the chest abdomen and pelvis including: Calcified left hepatic mass, small splenic lesions, bilateral ureteral stents, diverticulosis of the colon, Aortic Atherosclerosis (ICD10-I70.0). Electronically Signed   By: Genevie Ann M.D.   On: 07/02/2018 18:28    Dg Pelvis Portable  Result Date: 07/02/2018 CLINICAL DATA:  Ran over by tractor. EXAM: PORTABLE PELVIS 1-2 VIEWS COMPARISON:  None. FINDINGS: There is no evidence of pelvic fracture or diastasis. No pelvic bone lesions are seen. IMPRESSION: Negative. Electronically Signed   By: Marijo Conception, M.D.   On: 07/02/2018 17:16   Ct T-spine No Charge  Result Date: 07/02/2018 CLINICAL DATA:  85 year old male run over by tractor. Pain. EXAM: CT THORACIC SPINE WITH CONTRAST CT LUMBAR SPINE WITH CONTRAST TECHNIQUE: Multiplanar CT images of the thoracic and lumbar spine were reconstructed from contemporary CTs of the Chest abdomen, and pelvis. CONTRAST:  No additional. COMPARISON:  CT Chest, Abdomen, and Pelvis today are reported separately. CT Abdomen and Pelvis 09/27/2017. FINDINGS: THORACIC SPINE: Segmentation: Normal. Alignment: Preserved thoracic kyphosis. Vertebrae: Thoracic vertebrae appear intact. Right rib fractures are reported separately today. Paraspinal and other soft tissues: Reported separately today. Disc levels: No CT evidence of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

## 2018-07-03 NOTE — ED Notes (Signed)
Lunch tray ordered 

## 2018-07-03 NOTE — Progress Notes (Signed)
Rehab Admissions Coordinator Note:  Patient was screened by Michel Santee for appropriateness for an Inpatient Acute Rehab Consult.  At this time, pt is under observation status.  If he is admitted to the hospital as an inpatient, we can evaluate whether he is appropriate for CIR. Please call me with questions.   Michel Santee 07/03/2018, 3:16 PM  I can be reached at [phone].

## 2018-07-03 NOTE — ED Notes (Signed)
Patient continues to sit in the recliner and use incentive spirometer. He reports that he will need pain medicine before getting up from chair.  Family at bedisde

## 2018-07-03 NOTE — ED Notes (Signed)
Breakfast tray ordered 

## 2018-07-03 NOTE — Consult Note (Addendum)
Reason for Consult:Iliac fx Referring Physician: L Kinsinger  [name] is an 85 y.o. male.  HPI: Antario was working when his tractor rolled over him. He was brought to the ED as a level 1 trauma activation c/o right sided pain. His trauma workup showed multiple rib fxs and a right iliac fx. He was admitted to the trauma service and orthopedic surgery was consulted.  Past Medical History:  Diagnosis Date  . A-fib ([place])   . Lymphoma ([place])    Non-hodgkins  . Ureteral stent retained     History reviewed. No pertinent surgical history.  No family history on file.  Social History:  reports that he has never smoked. He has never used smokeless tobacco. He reports that he does not drink alcohol or use drugs.  Allergies:  Allergies  Allergen Reactions  . Atenolol Other (See Comments)    "Heart rate slowed- STOPPED on 08/16/2013"  . Niacin And Related Other (See Comments)    Headache     Medications: I have reviewed the patient's current medications.  Results for orders placed or performed during the hospital encounter of 07/02/18 (from the past 48 hour(s))  Prepare fresh frozen plasma     Status: None   Collection Time: 07/02/18  4:40 PM  Result Value Ref Range   Unit Number [id number]    Blood Component Type LIQ PLASMA    Unit division 00    Status of Unit REL FROM [facility]    Unit tag comment EMERGENCY RELEASE    Transfusion Status      OK TO TRANSFUSE Performed at [facility] [address] [place], [place] [address]    Unit Number [id number]    Blood Component Type LIQ PLASMA    Unit division 00    Status of Unit REL FROM [facility]    Unit tag comment EMERGENCY RELEASE    Transfusion Status OK TO TRANSFUSE   Type and screen Ordered by PROVIDER DEFAULT     Status: None (Preliminary result)   Collection Time: 07/02/18  4:55 PM  Result Value Ref Range   ABO/RH(D) A POS    Antibody Screen NEG    Sample Expiration 07/05/2018    Unit Number [id number]     Blood Component Type RED CELLS,LR    Unit division 00    Status of Unit REL FROM [facility]    Unit tag comment EMERGENCY RELEASE    Transfusion Status OK TO TRANSFUSE    Crossmatch Result NOT NEEDED    Unit Number [id number]    Blood Component Type RED CELLS,LR    Unit division 00    Status of Unit REL FROM [facility]    Unit tag comment EMERGENCY RELEASE    Transfusion Status OK TO TRANSFUSE    Crossmatch Result NOT NEEDED    Unit Number [id number]    Blood Component Type RED CELLS,LR    Unit division 00    Status of Unit ISSUED    Transfusion Status OK TO TRANSFUSE    Crossmatch Result Compatible    Unit Number [id number]    Blood Component Type RED CELLS,LR    Unit division 00    Status of Unit ISSUED    Transfusion Status OK TO TRANSFUSE    Crossmatch Result      Compatible Performed at [facility], [place] [address] [place], [place] [address]   ABO/Rh     Status: None   Collection Time: 07/02/18  4:55 PM  Result Value Ref Range   ABO/RH(D)      A POS Performed at [facility] [address], [place], [place] [address]   CDS serology     Status: None   Collection Time: 07/02/18  4:59 PM  Result Value Ref Range   CDS serology specimen      SPECIMEN WILL BE HELD FOR 14 DAYS IF TESTING IS REQUIRED    Comment: Performed at [facility], [place] [address], [place], [place] [address]  Comprehensive metabolic panel     Status: Abnormal   Collection Time: 07/02/18  4:59 PM  Result Value Ref Range   Sodium 137 135 - 145 mmol/L   Potassium 3.4 (L) 3.5 - 5.1 mmol/L   Chloride 106 98 - 111 mmol/L   CO2 23 22 - 32 mmol/L   Glucose, Bld 204 (H) 70 - 99 mg/dL   BUN 35 (H) 8 - 23 mg/dL   Creatinine, Ser 1.78 (H) 0.61 - 1.24 mg/dL   Calcium 8.9 8.9 - 10.3 mg/dL   Total Protein 5.5 (L) 6.5 - 8.1 g/dL   Albumin 3.4 (L) 3.5 - 5.0 g/dL   AST 37 15 - 41 U/L   ALT 24 0 - 44 U/L   Alkaline Phosphatase 48 38 - 126 U/L   Total Bilirubin 0.4 0.3 - 1.2 mg/dL   GFR  calc non Af Amer 34 (L) >60 mL/min   GFR calc Af Amer 40 (L) >60 mL/min   Anion gap 8 5 - 15    Comment: Performed at [facility], [place] [address] [place], [place] [address]  CBC     Status: Abnormal   Collection Time: 07/02/18  4:59 PM  Result Value Ref Range   WBC 5.9 4.0 - 10.5 K/uL   RBC 3.31 (L) 4.22 - 5.81 MIL/uL   Hemoglobin 8.7 (L) 13.0 - 17.0 g/dL   HCT 28.3 (L) 39.0 - 52.0 %   MCV 85.5 80.0 - 100.0 fL   MCH 26.3 26.0 - 34.0 pg   MCHC 30.7 30.0 - 36.0 g/dL   RDW 15.4 11.5 - 15.5 %   Platelets 144 (L) 150 - 400 K/uL   nRBC 0.0 0.0 - 0.2 %    Comment: Performed at [facility], [place] [address] [place], [place] [address]  Ethanol     Status: None   Collection Time: 07/02/18  4:59 PM  Result Value Ref Range   Alcohol, Ethyl (B) <10 <10 mg/dL    Comment: (NOTE) Lowest detectable limit for serum alcohol is 10 mg/dL. For medical purposes only. Performed at [facility], [place] [address], [place], [place] [address]   Lactic acid, plasma     Status: None   Collection Time: 07/02/18  4:59 PM  Result Value Ref Range   Lactic Acid, Venous 1.5 0.5 - 1.9 mmol/L    Comment: Performed at [facility] [address], [place], [place] [address]  Protime-INR     Status: Abnormal   Collection Time: 07/02/18  4:59 PM  Result Value Ref Range   Prothrombin Time 27.5 (H) 11.4 - 15.2 seconds   INR 2.6 (H) 0.8 - 1.2    Comment: (NOTE) INR goal varies based on device and disease states. Performed at [facility], [place] [address] [place], [place] [address]   POCT I-Stat EG7     Status: Abnormal   Collection Time: 07/02/18  5:01 PM  Result Value Ref Range   pH, Ven 7.463 (H) 7.250 - 7.430   pCO2, Ven 31.7 (L) 44.0 - 60.0 mmHg   pO2, Ven 42.0 32.0 - 45.0 mmHg   Bicarbonate 22.7 20.0 - 28.0 mmol/L   TCO2 24 22 - 32 mmol/L   O2 Saturation 81.0 %   Acid-base deficit 1.0 0.0 - 2.0 mmol/L   Sodium 139 135 - 145 mmol/L   Potassium 3.5 3.5 - 5.1 mmol/L    Calcium, Ion 1.13 (L) 1.15 - 1.40 mmol/L   HCT 27.0 (L) 39.0 - 52.0 %   Hemoglobin 9.2 (L) 13.0 - 17.0 g/dL   Patient temperature HIDE    Sample type VENOUS   I-stat Creatinine, ED     Status: Abnormal   Collection Time: 07/02/18  5:01 PM  Result Value Ref Range   Creatinine, Ser 1.80 (H) 0.61 - 1.24 mg/dL  CBC     Status: Abnormal   Collection Time: 07/03/18  3:13 AM  Result Value Ref Range   WBC 5.1 4.0 - 10.5 K/uL   RBC 2.73 (L) 4.22 - 5.81 MIL/uL   Hemoglobin 7.1 (L) 13.0 - 17.0 g/dL    Comment: REPEATED TO VERIFY DELTA CHECK NOTED    HCT 23.4 (L) 39.0 - 52.0 %   MCV 85.7 80.0 - 100.0 fL   MCH 26.0 26.0 - 34.0 pg   MCHC 30.3 30.0 - 36.0 g/dL   RDW 15.5 11.5 - 15.5 %   Platelets 107 (L) 150 - 400 K/uL    Comment: REPEATED TO VERIFY PLATELET COUNT CONFIRMED BY SMEAR Immature Platelet Fraction may be clinically indicated, consider ordering this additional test [id number]    nRBC 0.0 0.0 - 0.2 %    Comment: Performed at [facility], [place] [address] [place], [place] [address]  Basic metabolic panel     Status: Abnormal   Collection Time: 07/03/18  3:13 AM  Result Value Ref Range   Sodium 138 135 - 145 mmol/L   Potassium 3.6 3.5 - 5.1 mmol/L   Chloride 109 98 - 111 mmol/L   CO2 22 22 - 32 mmol/L   Glucose, Bld 141 (H) 70 - 99 mg/dL   BUN 26 (H) 8 - 23 mg/dL   Creatinine, Ser 1.29 (H) 0.61 - 1.24 mg/dL   Calcium 8.5 (L) 8.9 - 10.3 mg/dL   GFR calc non Af Amer 51 (L) >60 mL/min   GFR calc Af Amer 59 (L) >60 mL/min   Anion gap 7 5 - 15    Comment: Performed at [facility] [address], [place], [place] [address]  Urinalysis, Routine w reflex microscopic     Status: Abnormal   Collection Time: 07/03/18  3:18 AM  Result Value Ref Range   Color, Urine YELLOW YELLOW   APPearance CLOUDY (A) CLEAR   Specific Gravity, Urine 1.027 1.005 - 1.030   pH 5.0 5.0 - 8.0   Glucose, UA 50 (A) NEGATIVE mg/dL   Hgb urine dipstick LARGE (A) NEGATIVE   Bilirubin Urine  NEGATIVE NEGATIVE   Ketones, ur NEGATIVE NEGATIVE mg/dL   Protein, ur 30 (A) NEGATIVE mg/dL   Nitrite NEGATIVE NEGATIVE   Leukocytes,Ua LARGE (A) NEGATIVE   RBC / HPF >50 (H) 0 - 5 RBC/hpf   WBC, UA >50 (H) 0 - 5 WBC/hpf   Bacteria, UA NONE SEEN NONE SEEN    Comment: Performed at [facility] [address] [place], [place] [address]  Prepare RBC     Status: None   Collection Time: 07/03/18  5:42 AM  Result Value Ref Range   Order Confirmation      ORDER PROCESSED BY BLOOD BANK Performed at [facility], [place] [address] [place], [place] [address]   Provider-confirm verbal Blood Bank order - RBC, FFP, Type & Screen; 2 Units; Order taken: 07/02/2018; 4:46 PM; Level 1 Trauma, Emergency Release, STAT 2 units of O positive red cells and 2 units of A plasmas emergency released to the ER @ 1650. All ...     Status: None   Collection Time: 07/03/18  7:28 AM  Result Value Ref Range   Blood product order confirm      MD AUTHORIZATION REQUESTED Performed at [facility] [address] [place], [place] [address]   CBG monitoring, ED     Status: None   Collection Time: 07/03/18  7:43 AM  Result Value Ref Range   Glucose-Capillary 94 70 - 99 mg/dL  Prepare RBC     Status: None   Collection Time: 07/03/18  8:27 AM  Result Value Ref Range   Order Confirmation      ORDER PROCESSED BY BLOOD BANK Performed at [facility], [place] [address] [place], [place] [address]     Dg Forearm Left  Result Date: 07/02/2018 CLINICAL DATA:  Run over by tractor.  Pain. EXAM: LEFT FOREARM - 2 VIEW COMPARISON:  None. FINDINGS: No evidence of fracture or dislocation. Regional arterial calcification incidentally noted. IMPRESSION: Negative. Electronically Signed   By: Nelson Chimes M.D.   On: 07/02/2018 18:59   Dg Forearm Right  Result Date: 07/02/2018 CLINICAL DATA:  Run over by tractor.  Pain. EXAM: RIGHT FOREARM - 2 VIEW COMPARISON:  None. FINDINGS: There is no evidence of fracture or  other focal bone lesions. Soft tissues are unremarkable. Incidental age related arterial calcification. IMPRESSION: Negative. Electronically Signed   By: Nelson Chimes M.D.   On: 07/02/2018 19:00   Ct Head Wo Contrast  Result Date: 07/02/2018 CLINICAL DATA:  85 year old male run over by tractor. EXAM: CT HEAD WITHOUT CONTRAST CT CERVICAL SPINE WITHOUT CONTRAST TECHNIQUE: Multidetector CT imaging of the head and cervical spine was performed following the standard protocol without intravenous contrast. Multiplanar CT image reconstructions of the cervical spine were also generated. COMPARISON:  None available, a duplicate account in PACS is being investigated and merge at this time. FINDINGS: CT HEAD FINDINGS Brain: Cerebral volume loss appears generalized. Mild to moderate for age patchy white matter hypodensity. No midline shift, ventriculomegaly, mass effect, evidence of mass lesion, intracranial hemorrhage or evidence of cortically based acute infarction. No cortical encephalomalacia. Vascular: Extensive calcified atherosclerosis at the skull base. Mild intracranial artery tortuosity. Skull: Intact. Sinuses/Orbits: Paranasal sinuses and mastoids are well pneumatized. Other: No definite acute orbit or scalp soft tissue finding. CT CERVICAL SPINE FINDINGS Alignment: Straightening of cervical lordosis. Cervicothoracic junction alignment is within normal limits. Bilateral posterior element alignment is within normal limits. Skull base and vertebrae: Visualized skull base is intact. No atlanto-occipital dissociation. Osteopenia. No acute osseous abnormality identified. Soft tissues and spinal canal: No prevertebral fluid or swelling. No visible canal hematoma. Calcified cervical carotid atherosclerosis. Otherwise negative noncontrast neck soft tissues. Disc levels: Widespread cervical spine degeneration. Up to mild degenerative spinal stenosis at C4-C5. Upper chest: Grossly intact visible upper thoracic levels.  Negative lung apices. IMPRESSION: 1. No acute traumatic injury identified in the head or cervical spine. 2.  No acute intracranial abnormality. 3. Cervical spine degeneration. Extensive calcified atherosclerosis. Electronically Signed   By: Genevie Ann M.D.   On: 07/02/2018 17:55   Ct Chest W Contrast  Result Date: 07/02/2018 CLINICAL DATA:  85 year old male run over by tractor. Follicular lymphoma. EXAM: CT CHEST, ABDOMEN, AND PELVIS WITH CONTRAST TECHNIQUE: Multidetector CT imaging of the chest, abdomen and pelvis was performed following the standard protocol during bolus administration of intravenous contrast. CONTRAST:  151mL OMNIPAQUE IOHEXOL 300 MG/ML  SOLN COMPARISON:  PET-CT 04/26/2018. Chest abdomen and pelvis CT 09/27/2017. FINDINGS: CT CHEST FINDINGS Cardiovascular: Intact thoracic aorta. Calcified coronary artery and Calcified aortic atherosclerosis. Mild cardiomegaly appears stable. No pericardial effusion. Stable right chest porta cath. Mediastinum/Nodes: Negative for mediastinal lymphadenopathy or hematoma. Lungs/Pleura: Major airways are patent. Dependent atelectasis in both lungs similar to that in 2019. Trace right pleural effusion/hemothorax. No right pneumothorax. Possible small lateral segment right middle lobe pulmonary contusion on series 4, image 103. Right lateral costophrenic angle atelectasis appears stable. No contralateral left pneumothorax, pleural effusion or pulmonary contusion. Musculoskeletal: Acute mildly displaced fractures of the right anterolateral 4th and lateral 7 ribs. A nondisplaced fracture of the right lateral 6th rib appears chronic. Nondisplaced fracture of the right lateral 5th rib is age indeterminate. There is a new nondisplaced fracture of the right lateral 9th rib. Stable chronic left anterior 3rd rib nondisplaced fracture. No acute left rib fracture identified. Sternum appears intact. Thoracic vertebrae appear stable. Visible shoulder osseous structures appear  stable. Superficial right lateral chest wall stranding compatible with contusion or mild hematoma on series 3, image 41 overlying the 6th rib. There is mild intercostal space hematoma right anterolateral 3/4 interspace on series 3, image 26. CT ABDOMEN PELVIS FINDINGS Hepatobiliary: Stable coarsely calcified left hepatic mass, 7.7 centimeters. Stable liver parenchyma elsewhere with occasional subcentimeter low-density areas. Chronic cholecystectomy. Pancreas: Stable pancreatic atrophy. Spleen: Stable splenic heterogeneity with multiple predominantly subcentimeter hypodense areas. Adrenals/Urinary Tract: Negative adrenal glands. Chronic bilateral ureteral stents are in place with configuration not significantly changed since June 2019. Bilateral renal enhancement and contrast excretion appears symmetric and within normal limits. Stable renal parenchyma with cortical scarring. Mildly distended but otherwise unremarkable urinary bladder. Stomach/Bowel: Negative rectum. Sigmoid tortuosity and diverticulosis. Similar extensive diverticulosis in the descending and transverse colon. Negative right colon and appendix. No dilated small bowel. Negative stomach. No free air or free fluid. Vascular/Lymphatic: Aortoiliac calcified atherosclerosis. Major arterial structures appear patent and intact. Portal venous system is patent. Reproductive: Stable, negative. Other: No pelvic free fluid. Musculoskeletal: Lumbar spine appears stable including lower lumbar spondylolisthesis and degenerative changes. Sacrum and SI joints appear intact. Subtle nondisplaced fracture through the posteromedial right iliac bone on series 3, image 97 and coronal image 128. The nearby right SI joint appears stable. No other acute pelvic fracture identified. No proximal femur fracture identified. No superficial soft tissue injury identified. IMPRESSION: 1. Acute non- to mildly displaced fractures of the right 4th, 7th, and 9th ribs 2. Trace right  hemothorax, small right intercostal hematoma, and possible small lateral segment right middle lobe pulmonary contusion. 3. Acute nondisplaced fracture of the posteromedial right iliac bone. The nearby right SI joint appears stable. No other pelvic fracture identified. 4. No other acute traumatic injury identified. CT thoracic and lumbar spine are reported separately. 5. Stable chronic findings in the chest abdomen and pelvis including: Calcified left hepatic mass, small splenic lesions, bilateral ureteral stents, diverticulosis of the colon, Aortic Atherosclerosis (ICD10-I70.0). Electronically Signed   By: Lemmie Evens  [place] Crane M.D.   On: 07/02/2018 18:28   Ct Cervical Spine Wo Contrast  Result Date: 07/02/2018 CLINICAL DATA:  85 year old male run over by tractor. EXAM: CT HEAD WITHOUT CONTRAST CT CERVICAL SPINE WITHOUT CONTRAST TECHNIQUE: Multidetector CT imaging of the head and cervical spine was performed following the standard protocol without intravenous contrast. Multiplanar CT image reconstructions of the cervical spine were also generated. COMPARISON:  None available, a duplicate account in PACS is being investigated and merge at this time. FINDINGS: CT HEAD FINDINGS Brain: Cerebral volume loss appears generalized. Mild to moderate for age patchy white matter hypodensity. No midline shift, ventriculomegaly, mass effect, evidence of mass lesion, intracranial hemorrhage or evidence of cortically based acute infarction. No cortical encephalomalacia. Vascular: Extensive calcified atherosclerosis at the skull base. Mild intracranial artery tortuosity. Skull: Intact. Sinuses/Orbits: Paranasal sinuses and mastoids are well pneumatized. Other: No definite acute orbit or scalp soft tissue finding. CT CERVICAL SPINE FINDINGS Alignment: Straightening of cervical lordosis. Cervicothoracic junction alignment is within normal limits. Bilateral posterior element alignment is within normal limits. Skull base and vertebrae: Visualized  skull base is intact. No atlanto-occipital dissociation. Osteopenia. No acute osseous abnormality identified. Soft tissues and spinal canal: No prevertebral fluid or swelling. No visible canal hematoma. Calcified cervical carotid atherosclerosis. Otherwise negative noncontrast neck soft tissues. Disc levels: Widespread cervical spine degeneration. Up to mild degenerative spinal stenosis at C4-C5. Upper chest: Grossly intact visible upper thoracic levels. Negative lung apices. IMPRESSION: 1. No acute traumatic injury identified in the head or cervical spine. 2. No acute intracranial abnormality. 3. Cervical spine degeneration. Extensive calcified atherosclerosis. Electronically Signed   By: Genevie Ann M.D.   On: 07/02/2018 17:55   Ct Abdomen Pelvis W Contrast  Result Date: 07/02/2018 CLINICAL DATA:  85 year old male run over by tractor. Follicular lymphoma. EXAM: CT CHEST, ABDOMEN, AND PELVIS WITH CONTRAST TECHNIQUE: Multidetector CT imaging of the chest, abdomen and pelvis was performed following the standard protocol during bolus administration of intravenous contrast. CONTRAST:  187mL OMNIPAQUE IOHEXOL 300 MG/ML  SOLN COMPARISON:  PET-CT 04/26/2018. Chest abdomen and pelvis CT 09/27/2017. FINDINGS: CT CHEST FINDINGS Cardiovascular: Intact thoracic aorta. Calcified coronary artery and Calcified aortic atherosclerosis. Mild cardiomegaly appears stable. No pericardial effusion. Stable right chest porta cath. Mediastinum/Nodes: Negative for mediastinal lymphadenopathy or hematoma. Lungs/Pleura: Major airways are patent. Dependent atelectasis in both lungs similar to that in 2019. Trace right pleural effusion/hemothorax. No right pneumothorax. Possible small lateral segment right middle lobe pulmonary contusion on series 4, image 103. Right lateral costophrenic angle atelectasis appears stable. No contralateral left pneumothorax, pleural effusion or pulmonary contusion. Musculoskeletal: Acute mildly displaced fractures  of the right anterolateral 4th and lateral 7 ribs. A nondisplaced fracture of the right lateral 6th rib appears chronic. Nondisplaced fracture of the right lateral 5th rib is age indeterminate. There is a new nondisplaced fracture of the right lateral 9th rib. Stable chronic left anterior 3rd rib nondisplaced fracture. No acute left rib fracture identified. Sternum appears intact. Thoracic vertebrae appear stable. Visible shoulder osseous structures appear stable. Superficial right lateral chest wall stranding compatible with contusion or mild hematoma on series 3, image 41 overlying the 6th rib. There is mild intercostal space hematoma right anterolateral 3/4 interspace on series 3, image 26. CT ABDOMEN PELVIS FINDINGS Hepatobiliary: Stable coarsely calcified left hepatic mass, 7.7 centimeters. Stable liver parenchyma elsewhere with occasional subcentimeter low-density areas. Chronic cholecystectomy. Pancreas: Stable pancreatic atrophy. Spleen: Stable splenic heterogeneity with multiple predominantly subcentimeter hypodense areas. Adrenals/Urinary Tract: Negative adrenal glands.  Chronic bilateral ureteral stents are in place with configuration not significantly changed since June 2019. Bilateral renal enhancement and contrast excretion appears symmetric and within normal limits. Stable renal parenchyma with cortical scarring. Mildly distended but otherwise unremarkable urinary bladder. Stomach/Bowel: Negative rectum. Sigmoid tortuosity and diverticulosis. Similar extensive diverticulosis in the descending and transverse colon. Negative right colon and appendix. No dilated small bowel. Negative stomach. No free air or free fluid. Vascular/Lymphatic: Aortoiliac calcified atherosclerosis. Major arterial structures appear patent and intact. Portal venous system is patent. Reproductive: Stable, negative. Other: No pelvic free fluid. Musculoskeletal: Lumbar spine appears stable including lower lumbar spondylolisthesis  and degenerative changes. Sacrum and SI joints appear intact. Subtle nondisplaced fracture through the posteromedial right iliac bone on series 3, image 97 and coronal image 128. The nearby right SI joint appears stable. No other acute pelvic fracture identified. No proximal femur fracture identified. No superficial soft tissue injury identified. IMPRESSION: 1. Acute non- to mildly displaced fractures of the right 4th, 7th, and 9th ribs 2. Trace right hemothorax, small right intercostal hematoma, and possible small lateral segment right middle lobe pulmonary contusion. 3. Acute nondisplaced fracture of the posteromedial right iliac bone. The nearby right SI joint appears stable. No other pelvic fracture identified. 4. No other acute traumatic injury identified. CT thoracic and lumbar spine are reported separately. 5. Stable chronic findings in the chest abdomen and pelvis including: Calcified left hepatic mass, small splenic lesions, bilateral ureteral stents, diverticulosis of the colon, Aortic Atherosclerosis (ICD10-I70.0). Electronically Signed   By: Genevie Ann M.D.   On: 07/02/2018 18:28   Dg Pelvis Portable  Result Date: 07/02/2018 CLINICAL DATA:  Ran over by tractor. EXAM: PORTABLE PELVIS 1-2 VIEWS COMPARISON:  None. FINDINGS: There is no evidence of pelvic fracture or diastasis. No pelvic bone lesions are seen. IMPRESSION: Negative. Electronically Signed   By: Marijo Conception, M.D.   On: 07/02/2018 17:16   Ct T-spine No Charge  Result Date: 07/02/2018 CLINICAL DATA:  85 year old male run over by tractor. Pain. EXAM: CT THORACIC SPINE WITH CONTRAST CT LUMBAR SPINE WITH CONTRAST TECHNIQUE: Multiplanar CT images of the thoracic and lumbar spine were reconstructed from contemporary CTs of the Chest abdomen, and pelvis. CONTRAST:  No additional. COMPARISON:  CT Chest, Abdomen, and Pelvis today are reported separately. CT Abdomen and Pelvis 09/27/2017. FINDINGS: THORACIC SPINE: Segmentation: Normal. Alignment:  Preserved thoracic kyphosis. Vertebrae: Thoracic vertebrae appear intact. Right rib fractures are reported separately today. Paraspinal and other soft tissues: Reported separately today. Disc levels: No CT evidence of thoracic spinal stenosis and mild for age degenerative changes. LUMBAR SPINE: Segmentation: Normal. Alignment: Stable since 2019 with grade 1 anterolisthesis of L5 on S1. Vertebrae: Lumbar vertebrae appear stable and intact. Visible sacrum and SI joints appear stable and intact. Paraspinal and other soft tissues: Reported separately today. Disc levels: Widespread lumbar spine degeneration appears stable since 2019. IMPRESSION: 1. No acute osseous abnormality in the thoracic or lumbar spine. 2.  CT Chest, Abdomen, and Pelvis today are reported separately. Electronically Signed   By: Genevie Ann M.D.   On: 07/02/2018 18:20   Ct L-spine No Charge  Result Date: 07/02/2018 CLINICAL DATA:  85 year old male run over by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

## 2018-07-03 NOTE — ED Notes (Signed)
Patient tolerated clear liquids. Diet advanced per verbal rder
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
## 2018-07-03 NOTE — ED Notes (Signed)
Incentive spirometer given and educated how to use. Patient demonstrates proper demonstration
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
## 2018-07-04 ENCOUNTER — Encounter (HOSPITAL_COMMUNITY): Payer: Self-pay | Admitting: Physical Medicine and Rehabilitation

## 2018-07-04 DIAGNOSIS — D62 Acute posthemorrhagic anemia: Secondary | ICD-10-CM | POA: Diagnosis present

## 2018-07-04 DIAGNOSIS — N189 Chronic kidney disease, unspecified: Secondary | ICD-10-CM | POA: Diagnosis not present

## 2018-07-04 DIAGNOSIS — S2241XA Multiple fractures of ribs, right side, initial encounter for closed fracture: Secondary | ICD-10-CM | POA: Diagnosis not present

## 2018-07-04 DIAGNOSIS — R739 Hyperglycemia, unspecified: Secondary | ICD-10-CM | POA: Diagnosis not present

## 2018-07-04 DIAGNOSIS — Z87898 Personal history of other specified conditions: Secondary | ICD-10-CM | POA: Diagnosis not present

## 2018-07-04 DIAGNOSIS — I2510 Atherosclerotic heart disease of native coronary artery without angina pectoris: Secondary | ICD-10-CM | POA: Diagnosis present

## 2018-07-04 DIAGNOSIS — S40812A Abrasion of left upper arm, initial encounter: Secondary | ICD-10-CM | POA: Diagnosis present

## 2018-07-04 DIAGNOSIS — R310 Gross hematuria: Secondary | ICD-10-CM | POA: Diagnosis present

## 2018-07-04 DIAGNOSIS — J942 Hemothorax: Secondary | ICD-10-CM | POA: Diagnosis not present

## 2018-07-04 DIAGNOSIS — N183 Chronic kidney disease, stage 3 (moderate): Secondary | ICD-10-CM | POA: Diagnosis present

## 2018-07-04 DIAGNOSIS — T1490XA Injury, unspecified, initial encounter: Secondary | ICD-10-CM | POA: Diagnosis not present

## 2018-07-04 DIAGNOSIS — S32391A Other fracture of right ilium, initial encounter for closed fracture: Secondary | ICD-10-CM | POA: Diagnosis not present

## 2018-07-04 DIAGNOSIS — R52 Pain, unspecified: Secondary | ICD-10-CM | POA: Diagnosis not present

## 2018-07-04 DIAGNOSIS — N390 Urinary tract infection, site not specified: Secondary | ICD-10-CM | POA: Diagnosis present

## 2018-07-04 DIAGNOSIS — M109 Gout, unspecified: Secondary | ICD-10-CM | POA: Diagnosis present

## 2018-07-04 DIAGNOSIS — C61 Malignant neoplasm of prostate: Secondary | ICD-10-CM | POA: Diagnosis present

## 2018-07-04 DIAGNOSIS — S40811A Abrasion of right upper arm, initial encounter: Secondary | ICD-10-CM | POA: Diagnosis present

## 2018-07-04 DIAGNOSIS — M19071 Primary osteoarthritis, right ankle and foot: Secondary | ICD-10-CM | POA: Diagnosis present

## 2018-07-04 DIAGNOSIS — S99911A Unspecified injury of right ankle, initial encounter: Secondary | ICD-10-CM | POA: Diagnosis not present

## 2018-07-04 DIAGNOSIS — C8590 Non-Hodgkin lymphoma, unspecified, unspecified site: Secondary | ICD-10-CM | POA: Diagnosis present

## 2018-07-04 DIAGNOSIS — S32301A Unspecified fracture of right ilium, initial encounter for closed fracture: Secondary | ICD-10-CM | POA: Diagnosis present

## 2018-07-04 DIAGNOSIS — D696 Thrombocytopenia, unspecified: Secondary | ICD-10-CM | POA: Diagnosis present

## 2018-07-04 DIAGNOSIS — T07XXXA Unspecified multiple injuries, initial encounter: Secondary | ICD-10-CM | POA: Diagnosis not present

## 2018-07-04 DIAGNOSIS — S2239XA Fracture of one rib, unspecified side, initial encounter for closed fracture: Secondary | ICD-10-CM | POA: Diagnosis present

## 2018-07-04 DIAGNOSIS — N179 Acute kidney failure, unspecified: Secondary | ICD-10-CM | POA: Diagnosis not present

## 2018-07-04 DIAGNOSIS — E1140 Type 2 diabetes mellitus with diabetic neuropathy, unspecified: Secondary | ICD-10-CM | POA: Diagnosis present

## 2018-07-04 DIAGNOSIS — C799 Secondary malignant neoplasm of unspecified site: Secondary | ICD-10-CM | POA: Diagnosis present

## 2018-07-04 DIAGNOSIS — I480 Paroxysmal atrial fibrillation: Secondary | ICD-10-CM

## 2018-07-04 DIAGNOSIS — I129 Hypertensive chronic kidney disease with stage 1 through stage 4 chronic kidney disease, or unspecified chronic kidney disease: Secondary | ICD-10-CM | POA: Diagnosis present

## 2018-07-04 DIAGNOSIS — Z7901 Long term (current) use of anticoagulants: Secondary | ICD-10-CM | POA: Diagnosis not present

## 2018-07-04 DIAGNOSIS — S27321A Contusion of lung, unilateral, initial encounter: Secondary | ICD-10-CM | POA: Diagnosis present

## 2018-07-04 DIAGNOSIS — R202 Paresthesia of skin: Secondary | ICD-10-CM | POA: Diagnosis present

## 2018-07-04 DIAGNOSIS — Z8249 Family history of ischemic heart disease and other diseases of the circulatory system: Secondary | ICD-10-CM | POA: Diagnosis not present

## 2018-07-04 DIAGNOSIS — E119 Type 2 diabetes mellitus without complications: Secondary | ICD-10-CM | POA: Diagnosis not present

## 2018-07-04 DIAGNOSIS — S32301D Unspecified fracture of right ilium, subsequent encounter for fracture with routine healing: Secondary | ICD-10-CM | POA: Diagnosis not present

## 2018-07-04 DIAGNOSIS — I482 Chronic atrial fibrillation, unspecified: Secondary | ICD-10-CM | POA: Diagnosis present

## 2018-07-04 DIAGNOSIS — G4733 Obstructive sleep apnea (adult) (pediatric): Secondary | ICD-10-CM | POA: Diagnosis present

## 2018-07-04 DIAGNOSIS — E1122 Type 2 diabetes mellitus with diabetic chronic kidney disease: Secondary | ICD-10-CM | POA: Diagnosis present

## 2018-07-04 DIAGNOSIS — Y9273 Farm field as the place of occurrence of the external cause: Secondary | ICD-10-CM | POA: Diagnosis not present

## 2018-07-04 DIAGNOSIS — S329XXA Fracture of unspecified parts of lumbosacral spine and pelvis, initial encounter for closed fracture: Secondary | ICD-10-CM

## 2018-07-04 DIAGNOSIS — S271XXA Traumatic hemothorax, initial encounter: Secondary | ICD-10-CM | POA: Diagnosis present

## 2018-07-04 DIAGNOSIS — E8809 Other disorders of plasma-protein metabolism, not elsewhere classified: Secondary | ICD-10-CM | POA: Diagnosis present

## 2018-07-04 DIAGNOSIS — D638 Anemia in other chronic diseases classified elsewhere: Secondary | ICD-10-CM | POA: Diagnosis present

## 2018-07-04 DIAGNOSIS — M25571 Pain in right ankle and joints of right foot: Secondary | ICD-10-CM | POA: Diagnosis not present

## 2018-07-04 DIAGNOSIS — I951 Orthostatic hypotension: Secondary | ICD-10-CM | POA: Diagnosis present

## 2018-07-04 DIAGNOSIS — S2231XD Fracture of one rib, right side, subsequent encounter for fracture with routine healing: Secondary | ICD-10-CM | POA: Diagnosis not present

## 2018-07-04 DIAGNOSIS — M7989 Other specified soft tissue disorders: Secondary | ICD-10-CM | POA: Diagnosis not present

## 2018-07-04 DIAGNOSIS — S96911D Strain of unspecified muscle and tendon at ankle and foot level, right foot, subsequent encounter: Secondary | ICD-10-CM | POA: Diagnosis not present

## 2018-07-04 DIAGNOSIS — S96911A Strain of unspecified muscle and tendon at ankle and foot level, right foot, initial encounter: Secondary | ICD-10-CM | POA: Diagnosis not present

## 2018-07-04 DIAGNOSIS — Z888 Allergy status to other drugs, medicaments and biological substances status: Secondary | ICD-10-CM | POA: Diagnosis not present

## 2018-07-04 DIAGNOSIS — E785 Hyperlipidemia, unspecified: Secondary | ICD-10-CM | POA: Diagnosis present

## 2018-07-04 DIAGNOSIS — B952 Enterococcus as the cause of diseases classified elsewhere: Secondary | ICD-10-CM | POA: Diagnosis present

## 2018-07-04 DIAGNOSIS — Z960 Presence of urogenital implants: Secondary | ICD-10-CM | POA: Diagnosis present

## 2018-07-04 DIAGNOSIS — S80211A Abrasion, right knee, initial encounter: Secondary | ICD-10-CM | POA: Diagnosis present

## 2018-07-04 DIAGNOSIS — N131 Hydronephrosis with ureteral stricture, not elsewhere classified: Secondary | ICD-10-CM | POA: Diagnosis not present

## 2018-07-04 DIAGNOSIS — E46 Unspecified protein-calorie malnutrition: Secondary | ICD-10-CM | POA: Diagnosis not present

## 2018-07-04 DIAGNOSIS — X58XXXD Exposure to other specified factors, subsequent encounter: Secondary | ICD-10-CM | POA: Diagnosis present

## 2018-07-04 DIAGNOSIS — Z9079 Acquired absence of other genital organ(s): Secondary | ICD-10-CM | POA: Diagnosis not present

## 2018-07-04 LAB — TYPE AND SCREEN
ABO/RH(D): A POS
Antibody Screen: NEGATIVE
UNIT DIVISION: 0
Unit division: 0
Unit division: 0
Unit division: 0

## 2018-07-04 LAB — URINALYSIS, ROUTINE W REFLEX MICROSCOPIC
BILIRUBIN URINE: NEGATIVE
Glucose, UA: >=500 mg/dL — AB
Ketones, ur: NEGATIVE mg/dL
NITRITE: NEGATIVE
Protein, ur: 30 mg/dL — AB
Specific Gravity, Urine: 1.008 (ref 1.005–1.030)
WBC, UA: >50 WBC/hpf — ABNORMAL HIGH (ref 0–5)
pH: 6 (ref 5.0–8.0)

## 2018-07-04 LAB — CBC
HCT: 28.9 % — ABNORMAL LOW (ref 39.0–52.0)
Hemoglobin: 9.3 g/dL — ABNORMAL LOW (ref 13.0–17.0)
MCH: 27 pg (ref 26.0–34.0)
MCHC: 32.2 g/dL (ref 30.0–36.0)
MCV: 84 fL (ref 80.0–100.0)
Platelets: 109 10*3/uL — ABNORMAL LOW (ref 150–400)
RBC: 3.44 MIL/uL — ABNORMAL LOW (ref 4.22–5.81)
RDW: 15.6 % — ABNORMAL HIGH (ref 11.5–15.5)
WBC: 5 10*3/uL (ref 4.0–10.5)
nRBC: 0 % (ref 0.0–0.2)

## 2018-07-04 LAB — BPAM RBC
Blood Product Expiration Date: 202003182359
Blood Product Expiration Date: 202003302359
Blood Product Expiration Date: 202004012359
Blood Product Expiration Date: 202004032359
ISSUE DATE / TIME: 202003100559
ISSUE DATE / TIME: 202003100852
ISSUE DATE / TIME: 202003100909
ISSUE DATE / TIME: 202003100938
UNIT TYPE AND RH: 5100
Unit Type and Rh: 5100
Unit Type and Rh: 6200
Unit Type and Rh: 6200

## 2018-07-04 LAB — BASIC METABOLIC PANEL
ANION GAP: 7 (ref 5–15)
BUN: 14 mg/dL (ref 8–23)
CO2: 24 mmol/L (ref 22–32)
Calcium: 8.5 mg/dL — ABNORMAL LOW (ref 8.9–10.3)
Chloride: 106 mmol/L (ref 98–111)
Creatinine, Ser: 1.1 mg/dL (ref 0.61–1.24)
GFR calc Af Amer: >60 mL/min (ref >60)
GFR calc non Af Amer: >60 mL/min (ref >60)
Glucose, Bld: 153 mg/dL — ABNORMAL HIGH (ref 70–99)
Potassium: 3.5 mmol/L (ref 3.5–5.1)
Sodium: 137 mmol/L (ref 135–145)

## 2018-07-04 LAB — GLUCOSE, CAPILLARY
Glucose-Capillary: 170 mg/dL — ABNORMAL HIGH (ref 70–99)
Glucose-Capillary: 188 mg/dL — ABNORMAL HIGH (ref 70–99)
Glucose-Capillary: 247 mg/dL — ABNORMAL HIGH (ref 70–99)
Glucose-Capillary: 228 mg/dL — ABNORMAL HIGH (ref 70–99)

## 2018-07-04 NOTE — Progress Notes (Signed)
Inpatient Rehabilitation Admissions Coordinator  I will follow up with patient and family tomorrow to discuss a potential inpt rehab admission.  Danne Baxter, RN, MSN Rehab Admissions Coordinator [phone] 07/04/2018 4:43 PM
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
## 2018-07-04 NOTE — Progress Notes (Signed)
Gross hematuria  Intv: Hematuria resolved, voiding into hand held urinal  S: "doing fine"    O: NAD Vitals:   07/04/18 0300 07/04/18 0400 07/04/18 0424 07/04/18 0500  BP:   (!) 106/55   Pulse:   (!) 58   Resp: 14 11  11   Temp:   98 F (36.7 C)   TempSrc:   Oral   SpO2:   98%   Weight:      Height:        Intake/Output Summary (Last 24 hours) at 07/04/2018 0624 Last data filed at 07/04/2018 0500 Gross per 24 hour  Intake 1177.07 ml  Output 1250 ml  Net -72.93 ml  non-labored breathing Abdomen is soft Clear urine in hand held urinal  Recent Labs    07/02/18 1659 07/02/18 1701 07/03/18 0313 07/03/18 2239  WBC 5.9  --  5.1 5.5  HGB 8.7* 9.2* 7.1* 9.2*  HCT 28.3* 27.0* 23.4* 28.7*   Recent Labs    07/02/18 1659 07/02/18 1701 07/03/18 0313  NA 137 139 138  K 3.4* 3.5 3.6  CL 106  --  109  CO2 23  --  22  GLUCOSE 204*  --  141*  BUN 35*  --  26*  CREATININE 1.78* 1.80* 1.29*  CALCIUM 8.9  --  8.5*   Recent Labs    07/02/18 1659  INR 2.6*   No results for input(s): PSA in the last 72 hours. No results for input(s): LABURIN in the last 72 hours. No results found for this or any previous visit.  Imp: Intermittent gross hematuria - baseline for him.  Currently urine is clear.  Would expect on-going hematuria once he starts moving around again, and as long as he is voiding without issue  And his hemoglobin is stable I wouldn't be alarmed concerned.  Rec: Continue as is, doesn't need foley.  No further recs from [facility] prospective.  I will alert Dr. [name] to his admission, we will contact patient to reschedule his pending stent exchange.
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
## 2018-07-05 ENCOUNTER — Inpatient Hospital Stay (HOSPITAL_COMMUNITY)
Admission: RE | Admit: 2018-07-05 | Discharge: 2018-07-14 | DRG: 560 | Disposition: A | Discharge status: Home Health | Payer: Medicare Other | Source: Intra-hospital | Attending: Physical Medicine & Rehabilitation | Admitting: Physical Medicine & Rehabilitation

## 2018-07-05 ENCOUNTER — Encounter: Payer: Self-pay | Admitting: Pulmonary Disease

## 2018-07-05 ENCOUNTER — Inpatient Hospital Stay (HOSPITAL_COMMUNITY)
Admission: RE | Admit: 2018-07-05 | Payer: Medicare Other | Source: Intra-hospital | Admitting: Physical Medicine & Rehabilitation

## 2018-07-05 ENCOUNTER — Inpatient Hospital Stay (HOSPITAL_COMMUNITY): Payer: Medicare Other

## 2018-07-05 ENCOUNTER — Other Ambulatory Visit: Payer: Self-pay

## 2018-07-05 DIAGNOSIS — N179 Acute kidney failure, unspecified: Secondary | ICD-10-CM | POA: Diagnosis present

## 2018-07-05 DIAGNOSIS — E8809 Other disorders of plasma-protein metabolism, not elsewhere classified: Secondary | ICD-10-CM | POA: Diagnosis present

## 2018-07-05 DIAGNOSIS — X58XXXD Exposure to other specified factors, subsequent encounter: Secondary | ICD-10-CM | POA: Diagnosis present

## 2018-07-05 DIAGNOSIS — Z8249 Family history of ischemic heart disease and other diseases of the circulatory system: Secondary | ICD-10-CM

## 2018-07-05 DIAGNOSIS — S96911D Strain of unspecified muscle and tendon at ankle and foot level, right foot, subsequent encounter: Secondary | ICD-10-CM

## 2018-07-05 DIAGNOSIS — E1122 Type 2 diabetes mellitus with diabetic chronic kidney disease: Secondary | ICD-10-CM | POA: Diagnosis present

## 2018-07-05 DIAGNOSIS — Z960 Presence of urogenital implants: Secondary | ICD-10-CM | POA: Diagnosis present

## 2018-07-05 DIAGNOSIS — Z9079 Acquired absence of other genital organ(s): Secondary | ICD-10-CM | POA: Diagnosis not present

## 2018-07-05 DIAGNOSIS — E46 Unspecified protein-calorie malnutrition: Secondary | ICD-10-CM | POA: Diagnosis not present

## 2018-07-05 DIAGNOSIS — M19071 Primary osteoarthritis, right ankle and foot: Secondary | ICD-10-CM | POA: Diagnosis not present

## 2018-07-05 DIAGNOSIS — I951 Orthostatic hypotension: Secondary | ICD-10-CM | POA: Diagnosis present

## 2018-07-05 DIAGNOSIS — G4733 Obstructive sleep apnea (adult) (pediatric): Secondary | ICD-10-CM | POA: Diagnosis present

## 2018-07-05 DIAGNOSIS — D696 Thrombocytopenia, unspecified: Secondary | ICD-10-CM

## 2018-07-05 DIAGNOSIS — S2249XA Multiple fractures of ribs, unspecified side, initial encounter for closed fracture: Secondary | ICD-10-CM

## 2018-07-05 DIAGNOSIS — B952 Enterococcus as the cause of diseases classified elsewhere: Secondary | ICD-10-CM | POA: Diagnosis present

## 2018-07-05 DIAGNOSIS — Z79899 Other long term (current) drug therapy: Secondary | ICD-10-CM

## 2018-07-05 DIAGNOSIS — N183 Chronic kidney disease, stage 3 unspecified: Secondary | ICD-10-CM

## 2018-07-05 DIAGNOSIS — N189 Chronic kidney disease, unspecified: Secondary | ICD-10-CM | POA: Diagnosis present

## 2018-07-05 DIAGNOSIS — E119 Type 2 diabetes mellitus without complications: Secondary | ICD-10-CM | POA: Diagnosis not present

## 2018-07-05 DIAGNOSIS — R310 Gross hematuria: Secondary | ICD-10-CM | POA: Diagnosis present

## 2018-07-05 DIAGNOSIS — M109 Gout, unspecified: Secondary | ICD-10-CM | POA: Diagnosis present

## 2018-07-05 DIAGNOSIS — N390 Urinary tract infection, site not specified: Secondary | ICD-10-CM | POA: Diagnosis present

## 2018-07-05 DIAGNOSIS — S96911A Strain of unspecified muscle and tendon at ankle and foot level, right foot, initial encounter: Secondary | ICD-10-CM

## 2018-07-05 DIAGNOSIS — S2241XA Multiple fractures of ribs, right side, initial encounter for closed fracture: Secondary | ICD-10-CM

## 2018-07-05 DIAGNOSIS — I10 Essential (primary) hypertension: Secondary | ICD-10-CM | POA: Diagnosis present

## 2018-07-05 DIAGNOSIS — W19XXXA Unspecified fall, initial encounter: Secondary | ICD-10-CM

## 2018-07-05 DIAGNOSIS — I129 Hypertensive chronic kidney disease with stage 1 through stage 4 chronic kidney disease, or unspecified chronic kidney disease: Secondary | ICD-10-CM | POA: Diagnosis present

## 2018-07-05 DIAGNOSIS — Z809 Family history of malignant neoplasm, unspecified: Secondary | ICD-10-CM

## 2018-07-05 DIAGNOSIS — Y9273 Farm field as the place of occurrence of the external cause: Secondary | ICD-10-CM | POA: Diagnosis not present

## 2018-07-05 DIAGNOSIS — Z888 Allergy status to other drugs, medicaments and biological substances status: Secondary | ICD-10-CM

## 2018-07-05 DIAGNOSIS — S2231XD Fracture of one rib, right side, subsequent encounter for fracture with routine healing: Secondary | ICD-10-CM

## 2018-07-05 DIAGNOSIS — I482 Chronic atrial fibrillation, unspecified: Secondary | ICD-10-CM | POA: Diagnosis present

## 2018-07-05 DIAGNOSIS — Z794 Long term (current) use of insulin: Secondary | ICD-10-CM

## 2018-07-05 DIAGNOSIS — T1490XA Injury, unspecified, initial encounter: Secondary | ICD-10-CM

## 2018-07-05 DIAGNOSIS — C8590 Non-Hodgkin lymphoma, unspecified, unspecified site: Secondary | ICD-10-CM | POA: Diagnosis present

## 2018-07-05 DIAGNOSIS — D638 Anemia in other chronic diseases classified elsewhere: Secondary | ICD-10-CM

## 2018-07-05 DIAGNOSIS — Z9049 Acquired absence of other specified parts of digestive tract: Secondary | ICD-10-CM

## 2018-07-05 DIAGNOSIS — S32301D Unspecified fracture of right ilium, subsequent encounter for fracture with routine healing: Secondary | ICD-10-CM | POA: Diagnosis not present

## 2018-07-05 DIAGNOSIS — Z87891 Personal history of nicotine dependence: Secondary | ICD-10-CM

## 2018-07-05 DIAGNOSIS — E1140 Type 2 diabetes mellitus with diabetic neuropathy, unspecified: Secondary | ICD-10-CM | POA: Diagnosis present

## 2018-07-05 DIAGNOSIS — Z7901 Long term (current) use of anticoagulants: Secondary | ICD-10-CM

## 2018-07-05 DIAGNOSIS — T07XXXA Unspecified multiple injuries, initial encounter: Secondary | ICD-10-CM | POA: Diagnosis not present

## 2018-07-05 DIAGNOSIS — I2510 Atherosclerotic heart disease of native coronary artery without angina pectoris: Secondary | ICD-10-CM | POA: Diagnosis present

## 2018-07-05 HISTORY — DX: Multiple fractures of ribs, unspecified side, initial encounter for closed fracture: S22.49XA

## 2018-07-05 LAB — GLUCOSE, CAPILLARY
Glucose-Capillary: 169 mg/dL — ABNORMAL HIGH (ref 70–99)
Glucose-Capillary: 156 mg/dL — ABNORMAL HIGH (ref 70–99)
Glucose-Capillary: 233 mg/dL — ABNORMAL HIGH (ref 70–99)
Glucose-Capillary: 225 mg/dL — ABNORMAL HIGH (ref 70–99)

## 2018-07-05 LAB — URINE CULTURE: Culture: >=100000 — AB

## 2018-07-05 MED ORDER — INSULIN ASPART 100 UNIT/ML ~~LOC~~ SOLN
0.0000 [IU] | Freq: Every day | SUBCUTANEOUS | Status: DC
  Administered 2018-07-05 – 2018-07-06 (×2): 2 [IU] via SUBCUTANEOUS
  Administered 2018-07-07: 3 [IU] via SUBCUTANEOUS
  Administered 2018-07-08 – 2018-07-12 (×3): 2 [IU] via SUBCUTANEOUS

## 2018-07-05 MED ORDER — PROCHLORPERAZINE 25 MG RE SUPP: 12.5000 mg | Freq: Four times a day (QID) | RECTAL | Status: DC | PRN

## 2018-07-05 MED ORDER — ALUM & MAG HYDROXIDE-SIMETH 200-200-20 MG/5ML PO SUSP: 30.0000 mL | ORAL | Status: DC | PRN

## 2018-07-05 MED ORDER — LATANOPROST 0.005 % OP SOLN
1.0000 [drp] | Freq: Every day | OPHTHALMIC | Status: DC
  Administered 2018-07-05 – 2018-07-13 (×9): 1 [drp] via OPHTHALMIC
  Filled 2018-07-05: qty 2.5

## 2018-07-05 MED ORDER — DIPHENHYDRAMINE HCL 12.5 MG/5ML PO ELIX: 12.5000 mg | ORAL_SOLUTION | Freq: Four times a day (QID) | ORAL | Status: DC | PRN

## 2018-07-05 MED ORDER — TRAZODONE HCL 50 MG PO TABS
25.0000 mg | ORAL_TABLET | Freq: Every evening | ORAL | Status: DC | PRN
  Administered 2018-07-05: 25 mg via ORAL
  Filled 2018-07-05: qty 1

## 2018-07-05 MED ORDER — RIVAROXABAN 15 MG PO TABS: 15.0000 mg | ORAL_TABLET | Freq: Every day | ORAL | Status: DC

## 2018-07-05 MED ORDER — BISACODYL 10 MG RE SUPP
10.0000 mg | Freq: Every day | RECTAL | Status: DC | PRN
  Administered 2018-07-09: 10 mg via RECTAL
  Filled 2018-07-05: qty 1

## 2018-07-05 MED ORDER — AMOXICILLIN 250 MG PO CAPS
250.0000 mg | ORAL_CAPSULE | Freq: Three times a day (TID) | ORAL | Status: AC
  Administered 2018-07-05 – 2018-07-07 (×7): 250 mg via ORAL
  Filled 2018-07-05 (×7): qty 1

## 2018-07-05 MED ORDER — CYCLOBENZAPRINE HCL 5 MG PO TABS
5.0000 mg | ORAL_TABLET | Freq: Every day | ORAL | Status: DC | PRN
  Administered 2018-07-06 – 2018-07-08 (×2): 5 mg via ORAL
  Filled 2018-07-05 (×2): qty 1

## 2018-07-05 MED ORDER — ONDANSETRON HCL 4 MG/2ML IJ SOLN: 4.0000 mg | Freq: Four times a day (QID) | INTRAMUSCULAR | Status: DC | PRN

## 2018-07-05 MED ORDER — ACETAMINOPHEN 325 MG PO TABS
325.0000 mg | ORAL_TABLET | ORAL | Status: DC | PRN
  Administered 2018-07-06: 650 mg via ORAL
  Filled 2018-07-05: qty 2

## 2018-07-05 MED ORDER — SERTRALINE HCL 100 MG PO TABS
100.0000 mg | ORAL_TABLET | Freq: Every day | ORAL | Status: DC
  Administered 2018-07-06 – 2018-07-14 (×9): 100 mg via ORAL
  Filled 2018-07-05 (×9): qty 1

## 2018-07-05 MED ORDER — ONDANSETRON 4 MG PO TBDP: 4.0000 mg | ORAL_TABLET | Freq: Four times a day (QID) | ORAL | Status: DC | PRN

## 2018-07-05 MED ORDER — FLEET ENEMA 7-19 GM/118ML RE ENEM: 1.0000 | ENEMA | Freq: Once | RECTAL | Status: DC | PRN

## 2018-07-05 MED ORDER — ATORVASTATIN CALCIUM 10 MG PO TABS
20.0000 mg | ORAL_TABLET | Freq: Every day | ORAL | Status: DC
  Administered 2018-07-06 – 2018-07-13 (×8): 20 mg via ORAL
  Filled 2018-07-05 (×8): qty 2

## 2018-07-05 MED ORDER — INSULIN ASPART 100 UNIT/ML ~~LOC~~ SOLN
0.0000 [IU] | Freq: Three times a day (TID) | SUBCUTANEOUS | Status: DC
  Administered 2018-07-06: 2 [IU] via SUBCUTANEOUS
  Administered 2018-07-06: 1 [IU] via SUBCUTANEOUS
  Administered 2018-07-06: 2 [IU] via SUBCUTANEOUS
  Administered 2018-07-07: 3 [IU] via SUBCUTANEOUS
  Administered 2018-07-07 – 2018-07-08 (×3): 2 [IU] via SUBCUTANEOUS
  Administered 2018-07-08: 1 [IU] via SUBCUTANEOUS
  Administered 2018-07-08: 3 [IU] via SUBCUTANEOUS
  Administered 2018-07-09: 2 [IU] via SUBCUTANEOUS
  Administered 2018-07-09: 3 [IU] via SUBCUTANEOUS
  Administered 2018-07-09: 1 [IU] via SUBCUTANEOUS
  Administered 2018-07-10 (×2): 2 [IU] via SUBCUTANEOUS
  Administered 2018-07-10: 3 [IU] via SUBCUTANEOUS
  Administered 2018-07-11: 1 [IU] via SUBCUTANEOUS
  Administered 2018-07-11 – 2018-07-12 (×3): 2 [IU] via SUBCUTANEOUS
  Administered 2018-07-12 – 2018-07-14 (×5): 1 [IU] via SUBCUTANEOUS

## 2018-07-05 MED ORDER — POLYETHYLENE GLYCOL 3350 17 G PO PACK: 17.0000 g | PACK | Freq: Every day | ORAL | Status: DC

## 2018-07-05 MED ORDER — FENOFIBRATE 160 MG PO TABS
160.0000 mg | ORAL_TABLET | Freq: Every day | ORAL | Status: DC
  Administered 2018-07-06 – 2018-07-14 (×9): 160 mg via ORAL
  Filled 2018-07-05 (×9): qty 1

## 2018-07-05 MED ORDER — ADULT MULTIVITAMIN W/MINERALS CH
1.0000 | ORAL_TABLET | Freq: Every day | ORAL | Status: DC
  Administered 2018-07-06 – 2018-07-14 (×9): 1 via ORAL
  Filled 2018-07-05 (×9): qty 1

## 2018-07-05 MED ORDER — NITROFURANTOIN MONOHYD MACRO 100 MG PO CAPS
100.0000 mg | ORAL_CAPSULE | Freq: Two times a day (BID) | ORAL | Status: DC
  Administered 2018-07-05: 100 mg via ORAL
  Filled 2018-07-05 (×2): qty 1

## 2018-07-05 MED ORDER — LORATADINE 10 MG PO TABS
10.0000 mg | ORAL_TABLET | Freq: Every day | ORAL | Status: DC
  Administered 2018-07-06 – 2018-07-14 (×9): 10 mg via ORAL
  Filled 2018-07-05 (×9): qty 1

## 2018-07-05 MED ORDER — CHOLESTYRAMINE 4 G PO PACK
4.0000 g | PACK | Freq: Every day | ORAL | Status: DC | PRN
  Administered 2018-07-06: 4 g via ORAL
  Filled 2018-07-05 (×3): qty 1

## 2018-07-05 MED ORDER — GUAIFENESIN-DM 100-10 MG/5ML PO SYRP: 5.0000 mL | ORAL_SOLUTION | Freq: Four times a day (QID) | ORAL | Status: DC | PRN

## 2018-07-05 MED ORDER — POLYETHYLENE GLYCOL 3350 17 G PO PACK: 17.0000 g | PACK | Freq: Every day | ORAL | Status: DC | PRN

## 2018-07-05 MED ORDER — ACETAMINOPHEN 325 MG PO TABS: 650.0000 mg | ORAL_TABLET | ORAL | Status: DC | PRN

## 2018-07-05 MED ORDER — OXYCODONE HCL 5 MG PO TABS
5.0000 mg | ORAL_TABLET | ORAL | Status: DC | PRN
  Administered 2018-07-05 – 2018-07-12 (×13): 5 mg via ORAL
  Filled 2018-07-05 (×15): qty 1

## 2018-07-05 MED ORDER — BACITRACIN ZINC 500 UNIT/GM EX OINT
TOPICAL_OINTMENT | Freq: Two times a day (BID) | CUTANEOUS | Status: DC
  Administered 2018-07-05 – 2018-07-13 (×17): via TOPICAL
  Administered 2018-07-14: 1 via TOPICAL
  Filled 2018-07-05: qty 28.4

## 2018-07-05 MED ORDER — PROCHLORPERAZINE EDISYLATE 10 MG/2ML IJ SOLN: 5.0000 mg | Freq: Four times a day (QID) | INTRAMUSCULAR | Status: DC | PRN

## 2018-07-05 MED ORDER — DOCUSATE SODIUM 100 MG PO CAPS
100.0000 mg | ORAL_CAPSULE | Freq: Two times a day (BID) | ORAL | Status: DC
  Administered 2018-07-05 – 2018-07-14 (×17): 100 mg via ORAL
  Filled 2018-07-05 (×18): qty 1

## 2018-07-05 MED ORDER — INSULIN GLARGINE 100 UNIT/ML ~~LOC~~ SOLN
20.0000 [IU] | Freq: Every day | SUBCUTANEOUS | Status: DC
  Administered 2018-07-05 – 2018-07-06 (×2): 20 [IU] via SUBCUTANEOUS
  Filled 2018-07-05 (×4): qty 0.2

## 2018-07-05 MED ORDER — PROCHLORPERAZINE MALEATE 5 MG PO TABS: 5.0000 mg | ORAL_TABLET | Freq: Four times a day (QID) | ORAL | Status: DC | PRN

## 2018-07-05 MED ORDER — PANCRELIPASE (LIP-PROT-AMYL) 12000-38000 UNITS PO CPEP
12000.0000 [IU] | ORAL_CAPSULE | Freq: Three times a day (TID) | ORAL | Status: DC
  Administered 2018-07-06 – 2018-07-14 (×24): 12000 [IU] via ORAL
  Filled 2018-07-05 (×27): qty 1

## 2018-07-05 MED ORDER — GABAPENTIN 300 MG PO CAPS
300.0000 mg | ORAL_CAPSULE | Freq: Three times a day (TID) | ORAL | Status: DC
  Administered 2018-07-05 – 2018-07-14 (×26): 300 mg via ORAL
  Filled 2018-07-05 (×26): qty 1

## 2018-07-05 MED ORDER — ORAL CARE MOUTH RINSE
15.0000 mL | Freq: Two times a day (BID) | OROMUCOSAL | Status: DC
  Administered 2018-07-05 – 2018-07-14 (×16): 15 mL via OROMUCOSAL

## 2018-07-05 MED ORDER — POLYETHYLENE GLYCOL 3350 17 G PO PACK
17.0000 g | PACK | Freq: Every day | ORAL | Status: DC
  Administered 2018-07-06 – 2018-07-14 (×9): 17 g via ORAL
  Filled 2018-07-05 (×9): qty 1

## 2018-07-05 MED ORDER — RIVAROXABAN 15 MG PO TABS
15.0000 mg | ORAL_TABLET | Freq: Every day | ORAL | Status: DC
  Administered 2018-07-06 – 2018-07-14 (×9): 15 mg via ORAL
  Filled 2018-07-05 (×9): qty 1
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
## 2018-07-05 NOTE — Discharge Instructions (Signed)
Rib Fracture ° °A rib fracture is a break or crack in one of the bones of the ribs. The ribs are like a cage that goes around your upper chest. A broken or cracked rib is often painful, but most do not cause other problems. Most rib fractures usually heal on their own in 1-3 months. °Follow these instructions at home: °Managing pain, stiffness, and swelling °· If directed, apply ice to the injured area. °? Put ice in a plastic bag. °? Place a towel between your skin and the bag. °? Leave the ice on for 20 minutes, 2-3 times a day. °· Take over-the-counter and prescription medicines only as told by your doctor. °Activity °· Avoid activities that cause pain to the injured area. Protect your injured area. °· Slowly increase activity as told by your doctor. °General instructions °· Do deep breathing as told by your doctor. You may be told to: °? Take deep breaths many times a day. °? Cough many times a day while hugging a pillow. °? Use a device (incentive spirometer) to do deep breathing many times a day. °· Drink enough fluid to keep your pee (urine) clear or pale yellow. °· Do not wear a rib belt or binder. These do not allow you to breathe deeply. °· Keep all follow-up visits as told by your doctor. This is important. °Contact a doctor if: °· You have a fever. °Get help right away if: °· You have trouble breathing. °· You are short of breath. °· You cannot stop coughing. °· You cough up thick or bloody spit (sputum). °· You feel sick to your stomach (nauseous), throw up (vomit), or have belly (abdominal) pain. °· Your pain gets worse and medicine does not help. °Summary °· A rib fracture is a break or crack in one of the bones of the ribs. °· Apply ice to the injured area and take medicines for pain as told by your doctor. °· Take deep breaths and cough many times a day. Hug a pillow every time you cough. °This information is not intended to replace advice given to you by your health care provider. Make sure you  discuss any questions you have with your health care provider. °Document Released: 01/19/2008 Document Revised: 07/12/2016 Document Reviewed: 07/12/2016 °Elsevier Interactive Patient Education © 2019 Elsevier Inc. ° °
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
## 2018-07-05 NOTE — Progress Notes (Signed)
Inpatient Rehabilitation Admissions Coordinator   I met with patient  at the bedside for rehabilitation assessment. We discussed goals and expectations of an inpatient rehab admission.  He prefers in pt rehab rather than SNF. He requested I talk to his daughter, [name], by phone which I did. She is also in agreement to admit. I contacted Dr. Grandville Silos, RN [place], [place], and SW, [place]. I will make the arrangements to admit today.  Danne Baxter, RN, MSN Rehab Admissions Coordinator [phone] 07/05/2018 1:15 PM
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
## 2018-07-05 NOTE — Progress Notes (Signed)
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 superficial soft tissue injury identified. IMPRESSION: 1. Acute non- to mildly displaced fractures of the right 4th, 7th, and 9th ribs 2. Trace right hemothorax, small right intercostal hematoma, and possible small lateral segment right middle lobe pulmonary contusion. 3. Acute nondisplaced fracture of the posteromedial right iliac bone. The nearby right SI joint appears stable. No other pelvic fracture identified. 4. No other acute traumatic injury identified. CT thoracic and lumbar spine are reported separately. 5. Stable chronic findings in the chest abdomen and pelvis including: Calcified left hepatic mass, small splenic lesions, bilateral ureteral stents, diverticulosis of the colon, Aortic Atherosclerosis (ICD10-I70.0). Electronically Signed   By: Genevie Ann M.D.   On: 07/02/2018 18:28   Dg Pelvis  Portable  Result Date: 07/02/2018 CLINICAL DATA:  Ran over by tractor. EXAM: PORTABLE PELVIS 1-2 VIEWS COMPARISON:  None. FINDINGS: There is no evidence of pelvic fracture or diastasis. No pelvic bone lesions are seen. IMPRESSION: Negative. Electronically Signed   By: Marijo Conception, M.D.   On: 07/02/2018 17:16   Ct T-spine No Charge  Result Date: 07/02/2018 CLINICAL DATA:  85 year old male run over by tractor. Pain. EXAM: CT THORACIC SPINE WITH CONTRAST CT LUMBAR SPINE WITH CONTRAST TECHNIQUE: Multiplanar CT images of the thoracic and lumbar spine were reconstructed from contemporary CTs of the Chest abdomen, and pelvis. CONTRAST:  No additional. COMPARISON:  CT Chest, Abdomen, and Pelvis today are reported separately. CT Abdomen and Pelvis 09/27/2017. FINDINGS: THORACIC SPINE: Segmentation: Normal. Alignment: Preserved thoracic kyphosis. Vertebrae: Thoracic vertebrae appear intact. Right  rib fractures are reported separately today. Paraspinal and other soft tissues: Reported separately today. Disc levels: No CT evidence of thoracic spinal stenosis and mild for age degenerative changes. LUMBAR SPINE: Segmentation: Normal. Alignment: Stable since 2019 with grade 1 anterolisthesis of L5 on S1. Vertebrae: Lumbar vertebrae appear stable and intact. Visible sacrum and SI joints appear stable and intact. Paraspinal and other soft tissues: Reported separately today. Disc levels: Widespread lumbar spine degeneration appears stable since 2019. IMPRESSION: 1. No acute osseous abnormality in the thoracic or lumbar spine. 2.  CT Chest, Abdomen, and Pelvis today are reported separately. Electronically Signed   By: Genevie Ann M.D.   On: 07/02/2018 18:20   Ct L-spine No Charge  Result Date: 07/02/2018 CLINICAL DATA:  85 year old male run over by tractor. Pain. EXAM: CT THORACIC SPINE WITH CONTRAST CT LUMBAR SPINE WITH CONTRAST TECHNIQUE: Multiplanar CT images of the thoracic and lumbar spine were reconstructed from  contemporary CTs of the Chest abdomen, and pelvis. CONTRAST:  No additional. COMPARISON:  CT Chest, Abdomen, and Pelvis today are reported separately. CT Abdomen and Pelvis 09/27/2017. FINDINGS: THORACIC SPINE: Segmentation: Normal. Alignment: Preserved thoracic kyphosis. Vertebrae: Thoracic vertebrae appear intact. Right rib fractures are reported separately today. Paraspinal and other soft tissues: Reported separately today. Disc levels: No CT evidence of thoracic spinal stenosis and mild for age degenerative changes. LUMBAR SPINE: Segmentation: Normal. Alignment: Stable since 2019 with grade 1 anterolisthesis of L5 on S1. Vertebrae: Lumbar vertebrae appear stable and intact. Visible sacrum and SI joints appear stable and intact. Paraspinal and other soft tissues: Reported separately today. Disc levels: Widespread lumbar spine degeneration appears stable since 2019. IMPRESSION: 1. No acute osseous abnormality in the thoracic or lumbar spine. 2.  CT Chest, Abdomen, and Pelvis today are reported separately. Electronically Signed   By: Genevie Ann M.D.   On: 07/02/2018 18:20   Dg Chest Port 1 View  Result Date: 07/03/2018 CLINICAL DATA:  [name] EXAM: PORTABLE CHEST 1 VIEW COMPARISON:  07/02/2018 FINDINGS: Cardiac shadow is stable. Right chest wall port is again noted and stable. Aortic calcifications are again seen. The overall inspiratory effort is poor with mild bibasilar atelectasis. No pneumothorax is identified. The known rib fractures on the right are not well appreciated. IMPRESSION: Mild bibasilar atelectasis. No sizable pneumothorax is noted. Electronically Signed   By: Inez Catalina M.D.   On: 07/03/2018 06:11   Dg Chest Port 1 View  Result Date: 07/02/2018 CLINICAL DATA:  Run over by tractor. EXAM: PORTABLE CHEST 1 VIEW COMPARISON:  None. FINDINGS: The heart size and mediastinal contours are within normal limits. Both lungs are clear. No pneumothorax or pleural effusion is noted. Mildly  displaced right seventh rib fracture. Right subclavian Port-A-Cath is noted with tip in right atrium. IMPRESSION: Mildly displaced right seventh rib fracture. No other definite traumatic abnormality seen. Electronically Signed   By: Marijo Conception, M.D.   On: 07/02/2018 17:15   Dg Knee Complete 4 Views Right  Result Date: 07/02/2018 CLINICAL DATA:  Run over by tractor.  Knee pain. EXAM: RIGHT KNEE - COMPLETE 4+ VIEW COMPARISON:  None. FINDINGS: No evidence of fracture, dislocation or joint effusion. Chronic age related chondrocalcinosis. Arterial calcification throughout the region. IMPRESSION: Negative. Electronically Signed   By: Nelson Chimes M.D.   On: 07/02/2018 18:58     Assessment/Plan: Diagnosis: Polytrauma Labs independently reviewed.  Records reviewed and summated above.  1. Does the need for close, 24 hr/day medical supervision in concert  with the patient's rehab needs make it unreasonable for this patient to be served in a less intensive setting? Yes  2. Co-Morbidities requiring supervision/potential complications: Afib (monitor heart rate with increased mobility, continue meds), CAD, h/o syncope, NHL, metastatic prostate cancer, B/L ureteral stents, hyperglycemia (Monitor in accordance with exercise and adjust meds as necessary), pain (Biofeedback training with therapies to help reduce reliance on opiate pain medications, particularly IV morphine, monitor pain control during therapies, and sedation at rest and titrate to maximum efficacy to ensure participation and gains in therapies), ABLA (repeat labs, consider transfusion if necessary to ensure appropriate perfusion for increased activity tolerance) 3. Due to safety, skin/wound care, disease management, pain management and patient education, does the patient require 24 hr/day rehab nursing? Yes 4. Does the patient require coordinated care of a physician, rehab nurse, PT (1-2 hrs/day, 5 days/week) and OT (1-2 hrs/day, 5 days/week) to  address physical and functional deficits in the context of the above medical diagnosis(es)? Potentially Addressing deficits in the following areas: balance, endurance, locomotion, strength, transferring, bathing, dressing, toileting and psychosocial support 5. Can the patient actively participate in an intensive therapy program of at least 3 hrs of therapy per day at least 5 days per week? Yes 6. The potential for patient to make measurable gains while on inpatient rehab is excellent 7. Anticipated functional outcomes upon discharge from inpatient rehab are modified independent and supervision  with PT, modified independent and supervision with OT, n/a with SLP. 8. Estimated rehab length of stay to reach the above functional goals is: 6-10 days. 9. Anticipated D/C setting: Home 10. Anticipated post D/C treatments: HH therapy and Home excercise program 11. Overall Rehab/Functional Prognosis: excellent  RECOMMENDATIONS: This patient's condition is appropriate for continued rehabilitative care in the following setting: CIR Patient has agreed to participate in recommended program. Yes Note that insurance prior authorization may be required for reimbursement for recommended care.  Comment: Rehab Admissions Coordinator to follow up.   I have personally performed a face to face diagnostic evaluation, including, but not limited to relevant history and physical exam findings, of this patient and developed relevant assessment and plan.  Additionally, I have reviewed and concur with the physician assistant's documentation above.   Michael Lesch, MD, ABPMR Michael Leriche, PA-C 07/04/2018        Revision History                        Routing History

## 2018-07-06 ENCOUNTER — Inpatient Hospital Stay (HOSPITAL_COMMUNITY): Payer: Medicare Other | Admitting: Physical Therapy

## 2018-07-06 ENCOUNTER — Encounter (HOSPITAL_COMMUNITY): Payer: Self-pay

## 2018-07-06 ENCOUNTER — Inpatient Hospital Stay (HOSPITAL_COMMUNITY): Payer: Medicare Other | Admitting: Occupational Therapy

## 2018-07-06 DIAGNOSIS — S96911A Strain of unspecified muscle and tendon at ankle and foot level, right foot, initial encounter: Secondary | ICD-10-CM

## 2018-07-06 DIAGNOSIS — M19071 Primary osteoarthritis, right ankle and foot: Secondary | ICD-10-CM

## 2018-07-06 DIAGNOSIS — N183 Chronic kidney disease, stage 3 unspecified: Secondary | ICD-10-CM

## 2018-07-06 DIAGNOSIS — D696 Thrombocytopenia, unspecified: Secondary | ICD-10-CM

## 2018-07-06 DIAGNOSIS — E119 Type 2 diabetes mellitus without complications: Secondary | ICD-10-CM

## 2018-07-06 DIAGNOSIS — I951 Orthostatic hypotension: Secondary | ICD-10-CM

## 2018-07-06 DIAGNOSIS — T07XXXA Unspecified multiple injuries, initial encounter: Secondary | ICD-10-CM

## 2018-07-06 DIAGNOSIS — E46 Unspecified protein-calorie malnutrition: Secondary | ICD-10-CM

## 2018-07-06 DIAGNOSIS — D638 Anemia in other chronic diseases classified elsewhere: Secondary | ICD-10-CM

## 2018-07-06 LAB — CBC WITH DIFFERENTIAL/PLATELET
ABS IMMATURE GRANULOCYTES: 0.09 10*3/uL — AB (ref 0.00–0.07)
BASOS ABS: 0 10*3/uL (ref 0.0–0.1)
Basophils Relative: 0 %
Eosinophils Absolute: 0.2 10*3/uL (ref 0.0–0.5)
Eosinophils Relative: 3 %
HCT: 29.2 % — ABNORMAL LOW (ref 39.0–52.0)
Hemoglobin: 9.6 g/dL — ABNORMAL LOW (ref 13.0–17.0)
IMMATURE GRANULOCYTES: 2 %
Lymphocytes Relative: 4 %
Lymphs Abs: 0.2 10*3/uL — ABNORMAL LOW (ref 0.7–4.0)
MCH: 27.4 pg (ref 26.0–34.0)
MCHC: 32.9 g/dL (ref 30.0–36.0)
MCV: 83.2 fL (ref 80.0–100.0)
Monocytes Absolute: 0.5 10*3/uL (ref 0.1–1.0)
Monocytes Relative: 9 %
NEUTROS ABS: 4.4 10*3/uL (ref 1.7–7.7)
NEUTROS PCT: 82 %
Platelets: 131 10*3/uL — ABNORMAL LOW (ref 150–400)
RBC: 3.51 MIL/uL — ABNORMAL LOW (ref 4.22–5.81)
RDW: 15.8 % — ABNORMAL HIGH (ref 11.5–15.5)
WBC: 5.3 10*3/uL (ref 4.0–10.5)
nRBC: 0 % (ref 0.0–0.2)

## 2018-07-06 LAB — COMPREHENSIVE METABOLIC PANEL
ALBUMIN: 3 g/dL — AB (ref 3.5–5.0)
ALT: 22 U/L (ref 0–44)
AST: 22 U/L (ref 15–41)
Alkaline Phosphatase: 49 U/L (ref 38–126)
Anion gap: 8 (ref 5–15)
BUN: 26 mg/dL — ABNORMAL HIGH (ref 8–23)
CO2: 23 mmol/L (ref 22–32)
Calcium: 9.2 mg/dL (ref 8.9–10.3)
Chloride: 104 mmol/L (ref 98–111)
Creatinine, Ser: 1.18 mg/dL (ref 0.61–1.24)
GFR calc Af Amer: >60 mL/min (ref >60)
GFR calc non Af Amer: 56 mL/min — ABNORMAL LOW (ref >60)
Glucose, Bld: 155 mg/dL — ABNORMAL HIGH (ref 70–99)
Potassium: 3.8 mmol/L (ref 3.5–5.1)
Sodium: 135 mmol/L (ref 135–145)
TOTAL PROTEIN: 5.6 g/dL — AB (ref 6.5–8.1)
Total Bilirubin: 0.7 mg/dL (ref 0.3–1.2)

## 2018-07-06 LAB — GLUCOSE, CAPILLARY
GLUCOSE-CAPILLARY: 167 mg/dL — AB (ref 70–99)
Glucose-Capillary: 145 mg/dL — ABNORMAL HIGH (ref 70–99)
Glucose-Capillary: 175 mg/dL — ABNORMAL HIGH (ref 70–99)
Glucose-Capillary: 235 mg/dL — ABNORMAL HIGH (ref 70–99)

## 2018-07-06 MED ORDER — DICLOFENAC SODIUM 1 % TD GEL
2.0000 g | Freq: Four times a day (QID) | TRANSDERMAL | Status: DC
  Administered 2018-07-06 – 2018-07-14 (×32): 2 g via TOPICAL
  Filled 2018-07-06: qty 100

## 2018-07-06 MED ORDER — PRO-STAT SUGAR FREE PO LIQD
30.0000 mL | Freq: Two times a day (BID) | ORAL | Status: DC
  Administered 2018-07-06 – 2018-07-14 (×17): 30 mL via ORAL
  Filled 2018-07-06 (×17): qty 30
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
## 2018-07-06 NOTE — Plan of Care (Signed)
  Problem: Consults Goal: RH GENERAL PATIENT EDUCATION Description See Patient Education module for education specifics. Outcome: Progressing Goal: Skin Care Protocol Initiated - if Braden Score 18 or less Description If consults are not indicated, leave blank or document N/A Outcome: Progressing Goal: Diabetes Guidelines if Diabetic/Glucose > 140 Description If diabetic or lab glucose is > 140 mg/dl - Initiate Diabetes/Hyperglycemia Guidelines & Document Interventions  Outcome: Progressing   Problem: RH BOWEL ELIMINATION Goal: RH STG MANAGE BOWEL WITH ASSISTANCE Description STG Manage Bowel with mod I Assistance.  Outcome: Progressing Goal: RH STG MANAGE BOWEL W/MEDICATION W/ASSISTANCE Description STG Manage Bowel with Medication with mod I Assistance.  Outcome: Progressing   Problem: RH BLADDER ELIMINATION Goal: RH STG MANAGE BLADDER WITH ASSISTANCE Description STG Manage Bladder With mod I Assistance  Outcome: Progressing   Problem: RH SKIN INTEGRITY Goal: RH STG SKIN FREE OF INFECTION/BREAKDOWN Description Patients skin will remain free from further breakdown or infection with min assist.  Outcome: Progressing Goal: RH STG MAINTAIN SKIN INTEGRITY WITH ASSISTANCE Description STG Maintain Skin Integrity With min Assistance.  Outcome: Progressing   Problem: RH SAFETY Goal: RH STG ADHERE TO SAFETY PRECAUTIONS W/ASSISTANCE/DEVICE Description STG Adhere to Safety Precautions With supervision Assistance/Device.  Outcome: Progressing   Problem: RH PAIN MANAGEMENT Goal: RH STG PAIN MANAGED AT OR BELOW PT'S PAIN GOAL Description < 4  Outcome: Progressing

## 2018-07-06 NOTE — Progress Notes (Signed)
Physical Therapy Session Note  Patient Details  Name: [name] MRN: [id number] Date of Birth: [birth year]  Today's Date: 07/06/2018 PT Individual Time: 9672-8979 PT Individual Time Calculation (min): 26 min   Short Term Goals: Week 1:  PT Short Term Goal 1 (Week 1): STG=LTG due to ELOS  Skilled Therapeutic Interventions/Progress Updates:  Pt received in w/c & agreeable to tx, reporting 8/10 pain in R ribs but premedicated & rest breaks provided. Transported pt to/from gym via w/c dependent assist for time management. Pt completes car transfer at sedan simulated height via squat pivot with mod assist and max cuing for technique with pt doing fair job of maintaining TDWB RLE (reviewed precautions with pt during session). Pt transferred sit<>stand with mod assist with cuing for hand placement and maintained standing with NWB RLE for ~1 minute x 2 trials.  Attempted to have pt hop on LLE but unable to clear foot from floor. At end of session pt left sitting in w/c in room with chair alarm donned & needs at hand.   Provided pt with w/c cushion to prevent skin breakdown.   Therapy Documentation Precautions:  Restrictions Weight Bearing Restrictions: Yes RLE Weight Bearing: Touchdown weight bearing    Therapy/Group: Individual Therapy  [name] 07/06/2018, 2:44 PM
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
## 2018-07-07 ENCOUNTER — Inpatient Hospital Stay (HOSPITAL_COMMUNITY): Payer: BLUE CROSS/BLUE SHIELD | Admitting: Occupational Therapy

## 2018-07-07 ENCOUNTER — Inpatient Hospital Stay (HOSPITAL_COMMUNITY): Payer: BLUE CROSS/BLUE SHIELD | Admitting: Physical Therapy

## 2018-07-07 LAB — GLUCOSE, CAPILLARY
GLUCOSE-CAPILLARY: 151 mg/dL — AB (ref 70–99)
Glucose-Capillary: 169 mg/dL — ABNORMAL HIGH (ref 70–99)
Glucose-Capillary: 222 mg/dL — ABNORMAL HIGH (ref 70–99)
Glucose-Capillary: 282 mg/dL — ABNORMAL HIGH (ref 70–99)

## 2018-07-07 MED ORDER — INSULIN GLARGINE 100 UNIT/ML ~~LOC~~ SOLN
24.0000 [IU] | Freq: Every day | SUBCUTANEOUS | Status: DC
  Administered 2018-07-07 – 2018-07-09 (×3): 24 [IU] via SUBCUTANEOUS
  Filled 2018-07-07 (×3): qty 0.24

## 2018-07-07 NOTE — Progress Notes (Signed)
Occupational Therapy Session Note  Patient Details  Name: [name] MRN: [id number] Date of Birth: [birth year]  Today's Date: 07/07/2018 OT Individual Time: 8921-1941 OT Individual Time Calculation (min): 60 min    Short Term Goals: Week 1:  OT Short Term Goal 1 (Week 1): Pt will complete stand pivot transfer to 3:1 with min assist using the RW. OT Short Term Goal 2 (Week 1): Pt will complete LB bathing and dressing with min guard assist.  OT Short Term Goal 3 (Week 1): Pt will maintain TDWBing through the RLE during transfers and LB selfcare with no more than min instructional cueing OT Short Term Goal 4 (Week 1): Pt will perform walk-in shower with min assist using the RW.  Skilled Therapeutic Interventions/Progress Updates:    Treatment session with focus on functional transfers and education on TDWB precautions.  Pt received supine in bed agreeable to getting OOB to have breakfast.  Pt completed bed mobility with min assist and mod assist sit > stand.  Pt required mod assist during stand pivot transfer with RW to maintain TDWB precautions.  Pt engaged in self-feeding while discussing goals of therapy and education on WB status.  Pt reports need to toilet.  Completed stand pivot transfer w/c <> toilet with RW with mod assist and mod cues to maintain WB precautions.  Pt completed clothing management and hygiene with mod assist.  Pt declined bathing/dressing but changed gripper socks as they had gotten wet during perineal hygiene post toileting.  Pt completed oral care seated at sink with setup assist.  Pt remained upright in w/c with seat belt alarm on and RN notified of pt request for pain meds prior to next therapy session.  Therapy Documentation Precautions:  Precautions Precautions: Fall Restrictions Weight Bearing Restrictions: Yes RLE Weight Bearing: Touchdown weight bearing Pain: Pain Assessment Pain Scale: 0-10 Pain Score: 5  Pain Type: Acute pain Pain Location: Rib  cage Pain Orientation: Right Pain Descriptors / Indicators: Aching Pain Frequency: Intermittent Pain Onset: On-going Patients Stated Pain Goal: 0 Pain Intervention(s): Medication (See eMAR) Multiple Pain Sites: No   Therapy/Group: Individual Therapy  Simonne Come 07/07/2018, 9:53 AM

## 2018-07-07 NOTE — Progress Notes (Signed)
Physical Therapy Session Note  Patient Details  Name: [name] MRN: [id number] Date of Birth: 01-Nov-1931  Today's Date: 07/07/2018 PT Individual Time: 0905-1000 PT Individual Time Calculation (min): 55 min   Short Term Goals: Week 1:  PT Short Term Goal 1 (Week 1): STG=LTG due to ELOS  Skilled Therapeutic Interventions/Progress Updates:   Pt received sitting in WC and agreeable to PT. PT instructed pt in WC mobility x 175f with supervision assist and min cues for direction and doorway management.   Stand pivot transfer to mat table in rehab gym with min-CGA from PT and min cues for TDWB through the RLE. Sit>supine with supervision assist .   Supine BLE therex: SAQ x 12,hip abduction x 12, hip adduction x 12 within pain free range, hip extension in sidelying. Min cues for full ROM and proper speed of movement.  Pt noted to have incontinent bladder movement following therex. [facility]f [facility] able to remove pants with supervision assist from PT. And don clean shorts with set up from PT.   Pt returned to rehab gym in [facility] with RW and supervision-CGA for safety x 5067fMin cues for proper WB through the RLE. Patient returned to room and left sitting in [facility] bell in reach and all needs met.         Therapy Documentation Precautions:  Precautions Precautions: Fall Restrictions Weight Bearing Restrictions: Yes RLE Weight Bearing: Touchdown weight bearing Pain: Pain Assessment Pain Scale: 0-10 Pain Score: 5  Pain Type: Acute pain Pain Location: Rib cage Pain Orientation: Right Pain Descriptors / Indicators: Aching Pain Frequency: Intermittent Pain Onset: On-going Patients Stated Pain Goal: 0 Pain Intervention(s): Medication (See eMAR) Multiple Pain Sites: No    Therapy/Group: Individual Therapy  AusLorie Phenix14/2020, 10:01 AM

## 2018-07-07 NOTE — Progress Notes (Signed)
Physical Therapy Session Note  Patient Details  Name: [name] MRN: [id number] Date of Birth: [birth year]  Today's Date: 07/07/2018 PT Individual Time: 3570-1779 PT Individual Time Calculation (min): 54 min   Short Term Goals: Week 1:  PT Short Term Goal 1 (Week 1): STG=LTG due to ELOS  Skilled Therapeutic Interventions/Progress Updates:   Pt in w/c and agreeable to therapy pain 6/10 in ribs. Declined pain intervention. Sit<>stands to RW w/ supervision while therapist assisted w/ donning sweatpants. Pt self-propelled w/c to/from therapy gym w/ supervision using BUEs. Worked on gait training and RLE strengthening this session. Ambulated 25' w/ RW and CGA, max verbal reminders to maintain RLE TDWB. RLE strengthening exercises in seated including LAQs 3x10, resisted heel slides w/ orange theraband 3x10, knee marches 3x10, resisted abduction w/ orange theraband 3x10, and heel raises 3x10. Occasional tactile cues for technique w/ exercises. Returned to room and ended session in supine, all needs met.   Therapy Documentation Precautions:  Precautions Precautions: Fall Restrictions Weight Bearing Restrictions: Yes RLE Weight Bearing: Touchdown weight bearing Vital Signs:   Therapy/Group: Individual Therapy  [name] K Arthur Speagle 07/07/2018, 5:00 PM
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
## 2018-07-08 LAB — GLUCOSE, CAPILLARY
Glucose-Capillary: 187 mg/dL — ABNORMAL HIGH (ref 70–99)
Glucose-Capillary: 147 mg/dL — ABNORMAL HIGH (ref 70–99)
Glucose-Capillary: 212 mg/dL — ABNORMAL HIGH (ref 70–99)
Glucose-Capillary: 248 mg/dL — ABNORMAL HIGH (ref 70–99)

## 2018-07-08 NOTE — Progress Notes (Signed)
[facility] PHYSICAL MEDICINE & REHABILITATION PROGRESS NOTE  Subjective/Complaints: Pt up in bed. Ate breakfast. Says that pain is controlled. No complaints  ROS: Patient denies fever, rash, sore throat, blurred vision, nausea, vomiting, diarrhea, cough, shortness of breath or chest pain,   back pain, headache, or mood change. .   Objective: Vital Signs: Blood pressure (!) 118/58, pulse 64, temperature 97.9 F (36.6 C), resp. rate 16, height 5\' 8"  (1.727 m), weight 69.5 kg, SpO2 95 %. No results found. Recent Labs    07/06/18 0619  WBC 5.3  HGB 9.6*  HCT 29.2*  PLT 131*   Recent Labs    07/06/18 0619  NA 135  K 3.8  CL 104  CO2 23  GLUCOSE 155*  BUN 26*  CREATININE 1.18  CALCIUM 9.2    Physical Exam: BP (!) 118/58 (BP Location: Left Arm)   Pulse 64   Temp 97.9 F (36.6 C)   Resp 16   Ht 5\' 8"  (1.727 m)   Wt 69.5 kg   SpO2 95%   BMI 23.30 kg/m  Constitutional: No distress . Vital signs reviewed. HEENT: EOMI, oral membranes moist Neck: supple Cardiovascular: RRR without murmur. No JVD    Respiratory: CTA Bilaterally without wheezes or rales. Normal effort    GI: BS +, non-tender, non-distended  Musc: Mild edema right hand.  No edema or tenderness in right ankle Neurologic: Alert and oriented. Motor: Bilateral upper extremities: 5/5 proximal distal Right lower extremity: Hip flexion 3-/5, knee extension 4 medicine/5, ankle dorsiflexion 4/5 Left lower extremity: Hip flexion 4-/5, knee extension 4/5, ankle dorsiflexion 4+/5  Skin: Warm and dry.  Intact.  Assessment/Plan: 1. Functional deficits secondary to polytrauma which require 3+ hours per day of interdisciplinary therapy in a comprehensive inpatient rehab setting.  Physiatrist is providing close team supervision and 24 hour management of active medical problems listed below.  Physiatrist and rehab team continue to assess barriers to discharge/monitor patient progress toward functional and medical  goals  Care Tool:  Bathing    Body parts bathed by patient: Right arm, Left arm, Chest, Abdomen, Front perineal area, Right upper leg, Left upper leg, Right lower leg, Left lower leg, Face         Bathing assist Assist Level: Moderate Assistance - Patient 50 - 74%     Upper Body Dressing/Undressing Upper body dressing   What is the patient wearing?: Pull over shirt    Upper body assist Assist Level: Set up assist    Lower Body Dressing/Undressing Lower body dressing      What is the patient wearing?: Pants, Incontinence brief     Lower body assist Assist for lower body dressing: Moderate Assistance - Patient 50 - 74%     Toileting Toileting    Toileting assist Assist for toileting: Minimal Assistance - Patient > 75%(set up urinal beside the bed) Assistive Device Comment: (have urinal set up beside the bed)   Transfers Chair/bed transfer  Transfers assist  Chair/bed transfer activity did not occur: Safety/medical concerns  Chair/bed transfer assist level: Contact Guard/Touching assist     Locomotion Ambulation   Ambulation assist   Ambulation activity did not occur: Safety/medical concerns  Assist level: Contact Guard/Touching assist Assistive device: Walker-rolling Max distance: 25'   Walk 10 feet activity   Assist  Walk 10 feet activity did not occur: Safety/medical concerns  Assist level: Contact Guard/Touching assist Assistive device: Walker-rolling   Walk 50 feet activity   Assist Walk 50 feet with  2 turns activity did not occur: Safety/medical concerns  Assist level: Contact Guard/Touching assist      Walk 150 feet activity   Assist Walk 150 feet activity did not occur: Safety/medical concerns         Walk 10 feet on uneven surface  activity   Assist Walk 10 feet on uneven surfaces activity did not occur: Safety/medical concerns         Wheelchair     Assist   Type of Wheelchair: Manual    Wheelchair assist  level: Supervision/Verbal cueing Max wheelchair distance: 150    Wheelchair 50 feet with 2 turns activity    Assist        Assist Level: Supervision/Verbal cueing   Wheelchair 150 feet activity     Assist     Assist Level: Supervision/Verbal cueing      Medical Problem List and Plan: 1.  Functional deficits secondary to Polytrauma   -Continue CIR therapies including PT, OT  2.  Chronic A fib/Antithrombotics: -DVT/anticoagulation:  Pharmaceutical: Xarelto             -antiplatelet therapy: N/A 3. Pain Management: Oxycodone prn 4. Mood: LCSW to follow for evaluation and support. Mood stable on Zoloft daily             -antipsychotic agents: N/A 5. Neuropsych: This patient is capable of making decisions on his own behalf. 6. Skin/Wound Care: Routine pressure relief measures 7. Fluids/Electrolytes/Nutrition: Monitor I/O. Encourage fluid intake.  8. H/o syncope/Orthostatic hypotension: Needs to take time with transitional movements. Continue TEDs 9. T2DM: BS are poorly controlled. Resumed insulin at 20 units at bedtime--was on Tujeo U-300 PTA. Will continue to monitor BS ac/hs with SSI and titrate up to home dose as indicated.   Still poorly controlled, increased lantus to 24u qhs 3/14---observe today 10. Metastatic prostate cancer/renal stents: To follow up with Dr. Rose Phi for stent exchange after discharge. Monitor for recurrent hematuria.  11.CKD III/Anemia of chronic disease: Encourage fluids.   Creatinine 1.18 on 3/13  Hemoglobin 9.6 on 3/13  Continue to monitor 12. Right iliac fracture: TDWB/Follow up with Dr. Percell Miller post discharge. 13. Right rib Fx/trace PTX: Encourage pulmonary toilet 14. Enterococcus UTI: Added amoxicillin X 7 days as stents in place.  15. Right ankle pain: Likely right ankle strain with superimposed OA  X-ray revealed soft tissue swelling and OA, has been reviewed with patient  Voltaren gel    Will consider splint if necessary 16.   Hypoalbuminemia  Supplement initiated on 3/13 17.  Thrombocytopenia  Platelets 131 on 3/13  Continue to monitor  LOS: 3 days A FACE TO FACE EVALUATION WAS PERFORMED  Meredith Staggers 07/08/2018, 8:33 AM

## 2018-07-09 ENCOUNTER — Inpatient Hospital Stay (HOSPITAL_COMMUNITY): Payer: BLUE CROSS/BLUE SHIELD | Admitting: Occupational Therapy

## 2018-07-09 ENCOUNTER — Inpatient Hospital Stay (HOSPITAL_COMMUNITY): Payer: BLUE CROSS/BLUE SHIELD | Admitting: Physical Therapy

## 2018-07-09 DIAGNOSIS — S96911D Strain of unspecified muscle and tendon at ankle and foot level, right foot, subsequent encounter: Secondary | ICD-10-CM

## 2018-07-09 LAB — CBC
HCT: 31.9 % — ABNORMAL LOW (ref 39.0–52.0)
Hemoglobin: 10.3 g/dL — ABNORMAL LOW (ref 13.0–17.0)
MCH: 27.1 pg (ref 26.0–34.0)
MCHC: 32.3 g/dL (ref 30.0–36.0)
MCV: 83.9 fL (ref 80.0–100.0)
Platelets: 164 10*3/uL (ref 150–400)
RBC: 3.8 MIL/uL — ABNORMAL LOW (ref 4.22–5.81)
RDW: 15.3 % (ref 11.5–15.5)
WBC: 6 10*3/uL (ref 4.0–10.5)
nRBC: 0 % (ref 0.0–0.2)

## 2018-07-09 LAB — GLUCOSE, CAPILLARY
GLUCOSE-CAPILLARY: 147 mg/dL — AB (ref 70–99)
Glucose-Capillary: 183 mg/dL — ABNORMAL HIGH (ref 70–99)
Glucose-Capillary: 210 mg/dL — ABNORMAL HIGH (ref 70–99)
Glucose-Capillary: 216 mg/dL — ABNORMAL HIGH (ref 70–99)

## 2018-07-09 MED ORDER — AMOXICILLIN 250 MG PO CAPS
250.0000 mg | ORAL_CAPSULE | Freq: Three times a day (TID) | ORAL | Status: AC
  Administered 2018-07-09 – 2018-07-14 (×15): 250 mg via ORAL
  Filled 2018-07-09 (×15): qty 1

## 2018-07-09 NOTE — Progress Notes (Signed)
Physical Therapy Session Note  Patient Details  Name: [name] MRN: [id number] Date of Birth: 1931/09/18  Today's Date: 07/09/2018 PT Individual Time: 1320-1430 PT Individual Time Calculation (min): 70 min   Short Term Goals: Week 1:  PT Short Term Goal 1 (Week 1): STG=LTG due to ELOS  Skilled Therapeutic Interventions/Progress Updates: Pt presented in w/c having completed lunch and agreeable to therapy. Pt stating some "soreness" at R ribs. Pt transported to rehab gym for energy conservation and participated in seated and standing therex as follows: Seated with 3lb cuffs to fatigue (avg 30 reps) LAQ Ankle pumps with knee extension  Standing with 3lb cuff RLE only to fatigue SLR Hip abd/add Hamstring curls  Hip flexion  Pt also performed blocked practice stand pivot transfers to/from mat with sensor boot on R foot. Pt with difficulty maintaining TDWB and sensor activating approx 80% of time. On last transfer pt performed with NWB but unable to generate enough power to clear L foot thus only shuffling to pivot. Pt also participated in game of horseshoes with sensor boot on x 2 bouts with only 1 occurrence of pt setting off sensor.  Pt returned to w/c and transported to [facility] entrance/atrium for w/c mobility on concrete surfaces for BUE strengthening/conditioning. Pt was able to propel distances of up to 110f with varying grades with close S to avoid curbs. Pt able to propel over door threshold and back to elevator supervision level. Pt transported remaining distance to room and remained in w/c at end of session with call bell within reach, chair alarm on and current needs met.      Therapy Documentation Precautions:  Precautions Precautions: Fall Restrictions Weight Bearing Restrictions: No RLE Weight Bearing: Weight bearing as tolerated General:   Vital Signs: Therapy Vitals Temp: (!) 97.5 F (36.4 C) Pulse Rate: 66 Resp: 18 BP: (!) 110/48 Patient Position (if  appropriate): Standing Oxygen Therapy SpO2: 98 % O2 Device: Room Air Pain: Pain Assessment Pain Scale: 0-10 Pain Score: 4  Pain Type: Acute pain Pain Location: Rib cage Pain Orientation: Right Pain Descriptors / Indicators: Discomfort Pain Onset: With Activity Pain Intervention(s): Emotional support;Repositioned    Therapy/Group: Individual Therapy  Nolan Lasser  Lorelai Huyser, PTA  07/09/2018, 2:37 PM

## 2018-07-09 NOTE — Care Management (Signed)
Inpatient [facility] Individual Statement of Services  Patient Name:  [name]  Date:  07/09/2018  Welcome to the [facility].  Our goal is to provide you with an individualized program based on your diagnosis and situation, designed to meet your specific needs.  With this comprehensive rehabilitation program, you will be expected to participate in at least 3 hours of rehabilitation therapies Monday-Friday, with modified therapy programming on the weekends.  Your rehabilitation program will include the following services:  Physical Therapy (PT), Occupational Therapy (OT), 24 hour per day rehabilitation nursing, Therapeutic Recreaction (TR), Case Management (Social Worker), Rehabilitation Medicine, Nutrition Services and Pharmacy Services  Weekly team conferences will be held on Wednesdays to discuss your progress.  Your Social Worker will talk with you frequently to get your input and to update you on team discussions.  Team conferences with you and your family in attendance may also be held.  Expected length of stay: 10-12 days   Overall anticipated outcome: supervision  Depending on your progress and recovery, your program may change. Your Social Worker will coordinate services and will keep you informed of any changes. Your Social Worker's name and contact numbers are listed  below.  The following services may also be recommended but are not provided by the [facility] will be made to provide these services after discharge if needed.  Arrangements include referral to agencies that provide these services.  Your insurance has been verified to be:  Medicare, [place] Your primary doctor is:  Psychiatrist  Pertinent information will be shared with your doctor and your insurance company.  Social Worker:  [place], [place]  or ([phone]   Information discussed with and copy given to patient by: Lennart Pall, 07/09/2018, 11:10 AM

## 2018-07-09 NOTE — Progress Notes (Signed)
Occupational Therapy Session Note  Patient Details  Name: [name] MRN: [id number] Date of Birth: [birth year]  Today's Date: 07/09/2018 OT Individual Time: 7829-5621 OT Individual Time Calculation (min): 28 min    Short Term Goals: Week 1:  OT Short Term Goal 1 (Week 1): Pt will complete stand pivot transfer to 3:1 with min assist using the RW. OT Short Term Goal 2 (Week 1): Pt will complete LB bathing and dressing with min guard assist.  OT Short Term Goal 3 (Week 1): Pt will maintain TDWBing through the RLE during transfers and LB selfcare with no more than min instructional cueing OT Short Term Goal 4 (Week 1): Pt will perform walk-in shower with min assist using the RW.  Skilled Therapeutic Interventions/Progress Updates:    Pt worked on Barr[employer] during session.  Took pt down to the dayroom where he utilized the UE ergonometer for BUE strengthening and endurance building.  He was able to complete 15 mins straight with resistance on level 6 and program on Random setting during session.  Dyspnea 2/4 post completion of set with slight pain reported in the right ribs.  Therapist pushed pt to and from the session as well for energy conservation.  Finished session with pt sitting at bedside with call button and phone in reach and safety alarm belt in place.    Therapy Documentation Precautions:  Precautions Precautions: Fall Restrictions Weight Bearing Restrictions: No RLE Weight Bearing: Weight bearing as tolerated  Pain: Pain Assessment Pain Scale: Faces Faces Pain Scale: Hurts a little bit Pain Type: Acute pain Pain Location: Rib cage Pain Orientation: Right Pain Descriptors / Indicators: Discomfort Pain Onset: With Activity Pain Intervention(s): Repositioned   Therapy/Group: Individual Therapy  Amun Stemm OTR/L 07/09/2018, 3:41 PM

## 2018-07-09 NOTE — Progress Notes (Signed)
Patient was toileted to the bathroom, hematuria noted in urine. Vitals WNL, no fever. MD notified, CBC ordered for in the A.M. Will continue to assess and monitor.

## 2018-07-09 NOTE — Plan of Care (Signed)
  Problem: Consults Goal: RH GENERAL PATIENT EDUCATION Description See Patient Education module for education specifics. Outcome: Progressing Goal: Skin Care Protocol Initiated - if Braden Score 18 or less Description If consults are not indicated, leave blank or document N/A Outcome: Progressing Goal: Diabetes Guidelines if Diabetic/Glucose > 140 Description If diabetic or lab glucose is > 140 mg/dl - Initiate Diabetes/Hyperglycemia Guidelines & Document Interventions  Outcome: Progressing   Problem: RH BOWEL ELIMINATION Goal: RH STG MANAGE BOWEL WITH ASSISTANCE Description STG Manage Bowel with mod I Assistance.  Outcome: Progressing Goal: RH STG MANAGE BOWEL W/MEDICATION W/ASSISTANCE Description STG Manage Bowel with Medication with mod I Assistance.  Outcome: Progressing   Problem: RH BLADDER ELIMINATION Goal: RH STG MANAGE BLADDER WITH ASSISTANCE Description STG Manage Bladder With mod I Assistance  Outcome: Progressing   Problem: RH SKIN INTEGRITY Goal: RH STG SKIN FREE OF INFECTION/BREAKDOWN Description Patients skin will remain free from further breakdown or infection with min assist.  Outcome: Progressing Goal: RH STG MAINTAIN SKIN INTEGRITY WITH ASSISTANCE Description STG Maintain Skin Integrity With min Assistance.  Outcome: Progressing   Problem: RH SAFETY Goal: RH STG ADHERE TO SAFETY PRECAUTIONS W/ASSISTANCE/DEVICE Description STG Adhere to Safety Precautions With supervision Assistance/Device.  Outcome: Progressing   Problem: RH PAIN MANAGEMENT Goal: RH STG PAIN MANAGED AT OR BELOW PT'S PAIN GOAL Description < 4  Outcome: Progressing

## 2018-07-09 NOTE — Progress Notes (Signed)
Occupational Therapy Session Note  Patient Details  Name: [name] MRN: [id number] Date of Birth: 1932-04-13  Today's Date: 07/09/2018 OT Individual Time: 1004-1100 OT Individual Time Calculation (min): 56 min    Short Term Goals: Week 1:  OT Short Term Goal 1 (Week 1): Pt will complete stand pivot transfer to 3:1 with min assist using the RW. OT Short Term Goal 2 (Week 1): Pt will complete LB bathing and dressing with min guard assist.  OT Short Term Goal 3 (Week 1): Pt will maintain TDWBing through the RLE during transfers and LB selfcare with no more than min instructional cueing OT Short Term Goal 4 (Week 1): Pt will perform walk-in shower with min assist using the RW.  Skilled Therapeutic Interventions/Progress Updates:   Pt completed bathing and dressing to start session sit to stand at the sink.  He was able to complete UB bathing with supervision and LB bathing with min guard assist to min assist sit to stand.  He needed mod instructional cueing for technique in order to avoid placing too much weight over the RLE with sit to stand transition, but once told, he could adhere to it.  When attempting to take steps, he was unable to complete this without demonstrating partial weightbearing through the RLE.  Finished session with BUE strengthening exercises using orange medium therapy band.  He was able to complete 2 sets of 15 reps for bilateral shoulder horizontal abduction.  He also completed 2 sets of 20 repetitions for elbow extension as well, all with supervision.  Finished session with pt in the wheelchair with call button and phone in reach and safety alarm pad in wheelchair.     Therapy Documentation Precautions:  Precautions Precautions: Fall Restrictions Weight Bearing Restrictions: No RLE Weight Bearing: Weight bearing as tolerated  Pain: Pain Assessment Pain Scale: 0-10 Pain Score: 4  Pain Type: Acute pain Pain Location: Rib cage Pain Orientation: Right Pain  Descriptors / Indicators: Discomfort Pain Onset: With Activity Pain Intervention(s): Emotional support;Repositioned Multiple Pain Sites: No ADL: See Care Tool For some details of ADL  Therapy/Group: Individual Therapy  Cathy Ropp OTR/L 07/09/2018, 11:01 AM

## 2018-07-09 NOTE — Progress Notes (Signed)
Physical Therapy Session Note  Patient Details  Name: [name] MRN: [id number] Date of Birth: [birth year]  Today's Date: 07/09/2018 PT Individual Time: 0930-0958 PT Individual Time Calculation (min): 28 min   Short Term Goals: Week 1:  PT Short Term Goal 1 (Week 1): STG=LTG due to ELOS  Skilled Therapeutic Interventions/Progress Updates:    session focused on sit <> stand and standing tolerance while maintaining TDWB on Rt LE.  Pt performs sit <> Stand and standing balance with PTs foot under his Rt foot to give feedback. Pt improves ability to maintain TDWB with repetition, then when fatigued puts increased wt on Rt LE. Pt unable to perform gait without breaking TDWB precautions.  standing therex with Rt LE 2 x 12 hip flex, HS curl and hip abd.  Pt left in chair with alarm set, needs at hand.  Therapy Documentation Precautions:  Precautions Precautions: Fall Restrictions Weight Bearing Restrictions: Yes RLE Weight Bearing: Weight bearing as tolerated Pain: Pt states no pain at rest, Rt rib pain with mobility, eases with rest   Therapy/Group: Individual Therapy  Jacie Tristan 07/09/2018, 10:00 AM

## 2018-07-09 NOTE — Progress Notes (Signed)
[place] PHYSICAL MEDICINE & REHABILITATION PROGRESS NOTE  Subjective/Complaints: Patient seen laying in bed this morning.  He states he slept well overnight.  He states he had a good weekend.  Noted to have hematuria over the weekend, discussed with physician.  ROS: Denies CP, SOB, nausea, vomiting, diarrhea.  Objective: Vital Signs: Blood pressure (!) 150/66, pulse 67, temperature 98.1 F (36.7 C), temperature source Oral, resp. rate 18, height 5\' 8"  (1.727 m), weight 69.5 kg, SpO2 97 %. No results found. Recent Labs    07/09/18 0609  WBC 6.0  HGB 10.3*  HCT 31.9*  PLT 164   No results for input(s): NA, K, CL, CO2, GLUCOSE, BUN, CREATININE, CALCIUM in the last 72 hours.  Physical Exam: BP (!) 150/66 (BP Location: Right Arm)   Pulse 67   Temp 98.1 F (36.7 C) (Oral)   Resp 18   Ht 5\' 8"  (1.727 m)   Wt 69.5 kg   SpO2 97%   BMI 23.30 kg/m  Constitutional: No distress . Vital signs reviewed. HENT: Normocephalic.  Atraumatic. Eyes: EOMI. No discharge. Cardiovascular: RRR. No JVD. Respiratory: CTA Bilaterally. Normal effort. GI: BS +. Non-distended. Musc: No edema or tenderness in extremities Neurologic: Alert and oriented. Motor: Bilateral upper extremities: 5/5 proximal distal Right lower extremity: Hip flexion 4-/5, knee extension 4-/5, ankle dorsiflexion 4/5 Left lower extremity: Hip flexion 4-/5, knee extension 4/5, ankle dorsiflexion 4+/5  Skin: Warm and dry.  Intact.  Assessment/Plan: 1. Functional deficits secondary to polytrauma which require 3+ hours per day of interdisciplinary therapy in a comprehensive inpatient rehab setting.  Physiatrist is providing close team supervision and 24 hour management of active medical problems listed below.  Physiatrist and rehab team continue to assess barriers to discharge/monitor patient progress toward functional and medical goals  Care Tool:  Bathing    Body parts bathed by patient: Right arm, Left arm, Chest,  Abdomen, Front perineal area, Right upper leg, Left upper leg, Face   Body parts bathed by helper: Buttocks, Right lower leg, Left lower leg     Bathing assist Assist Level: Minimal Assistance - Patient > 75%     Upper Body Dressing/Undressing Upper body dressing   What is the patient wearing?: Pull over shirt    Upper body assist Assist Level: Minimal Assistance - Patient > 75%    Lower Body Dressing/Undressing Lower body dressing      What is the patient wearing?: Pants     Lower body assist Assist for lower body dressing: Moderate Assistance - Patient 50 - 74%     Toileting Toileting    Toileting assist Assist for toileting: Minimal Assistance - Patient > 75% Assistive Device Comment: (have urinal set up beside the bed)   Transfers Chair/bed transfer  Transfers assist  Chair/bed transfer activity did not occur: Safety/medical concerns  Chair/bed transfer assist level: Contact Guard/Touching assist     Locomotion Ambulation   Ambulation assist   Ambulation activity did not occur: Safety/medical concerns  Assist level: Contact Guard/Touching assist Assistive device: Walker-rolling Max distance: 25'   Walk 10 feet activity   Assist  Walk 10 feet activity did not occur: Safety/medical concerns  Assist level: Contact Guard/Touching assist Assistive device: Walker-rolling   Walk 50 feet activity   Assist Walk 50 feet with 2 turns activity did not occur: Safety/medical concerns  Assist level: Contact Guard/Touching assist      Walk 150 feet activity   Assist Walk 150 feet activity did not occur: Safety/medical concerns  Walk 10 feet on uneven surface  activity   Assist Walk 10 feet on uneven surfaces activity did not occur: Safety/medical concerns         Wheelchair     Assist   Type of Wheelchair: Manual    Wheelchair assist level: Supervision/Verbal cueing Max wheelchair distance: 150    Wheelchair 50 feet with  2 turns activity    Assist        Assist Level: Supervision/Verbal cueing   Wheelchair 150 feet activity     Assist     Assist Level: Supervision/Verbal cueing      Medical Problem List and Plan: 1.  Functional deficits secondary to Polytrauma   Cont CIR  Notes reviewed, labs reviewed, discussed with physician 2.  Chronic A fib/Antithrombotics: -DVT/anticoagulation:  Pharmaceutical: Xarelto             -antiplatelet therapy: N/A 3. Pain Management: Oxycodone prn 4. Mood: LCSW to follow for evaluation and support. Mood stable on Zoloft daily             -antipsychotic agents: N/A 5. Neuropsych: This patient is capable of making decisions on his own behalf. 6. Skin/Wound Care: Routine pressure relief measures 7. Fluids/Electrolytes/Nutrition: Monitor I/O. Encourage fluid intake.  8. H/o syncope/Orthostatic hypotension: Needs to take time with transitional movements. Continue TEDs 9. T2DM: BS are poorly controlled. Was on Tujeo U-300 PTA. Will continue to monitor BS ac/hs with SSI and titrate up to home dose as indicated.   Increased lantus to 24u qhs on 3/14   Labile on 3/16, will consider mealtime insulin tomorrow if persistently labile 10. Metastatic prostate cancer/renal stents: To follow up with Dr. Rose Phi for stent exchange after discharge. Monitor for recurrent hematuria.  11.CKD III/Anemia of chronic disease: Encourage fluids.   Creatinine 1.18 on 3/13  Hemoglobin 10.3 on 3/16  Continue to monitor 12. Right iliac fracture: TDWB/Follow up with Dr. Percell Miller post discharge. 13. Right rib Fx/trace PTX: Encourage pulmonary toilet 14. Enterococcus UTI: Amoxicillin X 7 days ?completed as stents in place, will follow-up.  15. Right ankle pain: Likely right ankle OA with superimposed strain  X-ray revealed soft tissue swelling and OA  Voltaren gel    Will consider splint if necessary  Improving 16.  Hypoalbuminemia  Supplement initiated on 3/13 17.  Thrombocytopenia:  Resolved  Platelets 164 on 3/16  Continue to monitor  LOS: 4 days A FACE TO FACE EVALUATION WAS PERFORMED  Khia Dieterich [name] 07/09/2018, 9:05 AM

## 2018-07-10 ENCOUNTER — Inpatient Hospital Stay (HOSPITAL_COMMUNITY): Payer: BLUE CROSS/BLUE SHIELD | Admitting: Physical Therapy

## 2018-07-10 ENCOUNTER — Inpatient Hospital Stay (HOSPITAL_COMMUNITY): Payer: BLUE CROSS/BLUE SHIELD

## 2018-07-10 ENCOUNTER — Inpatient Hospital Stay (HOSPITAL_COMMUNITY): Payer: BLUE CROSS/BLUE SHIELD | Admitting: Occupational Therapy

## 2018-07-10 DIAGNOSIS — Z960 Presence of urogenital implants: Secondary | ICD-10-CM

## 2018-07-10 LAB — GLUCOSE, CAPILLARY
GLUCOSE-CAPILLARY: 173 mg/dL — AB (ref 70–99)
Glucose-Capillary: 190 mg/dL — ABNORMAL HIGH (ref 70–99)
Glucose-Capillary: 230 mg/dL — ABNORMAL HIGH (ref 70–99)
Glucose-Capillary: 179 mg/dL — ABNORMAL HIGH (ref 70–99)

## 2018-07-10 IMAGING — PT NM PET TUM IMG RESTAG (PS) SKULL BASE T - THIGH
1 of 8 series · 1 of 25 positions shown · non-contrast
Comparison: Multiple exams, including 04/21/2014

CLINICAL DATA: Subsequent treatment strategy for grade 2 follicular
lymphoma and prostate cancer.

EXAM:
NUCLEAR MEDICINE PET SKULL BASE TO THIGH
TECHNIQUE: 9.1 mCi F-18 FDG was injected intravenously. Full-ring PET imaging
was performed from the skull base to thigh after the radiotracer. CT
data was obtained and used for attenuation correction and anatomic
localization.
FASTING BLOOD GLUCOSE:  Value: 165 mg/dl

[Series 4: ct sk_thigh 5.0 b31f · axial · 5.0mm · 0.98mm/px · 1 of 227 slices shown]
[im 227/227  brain]
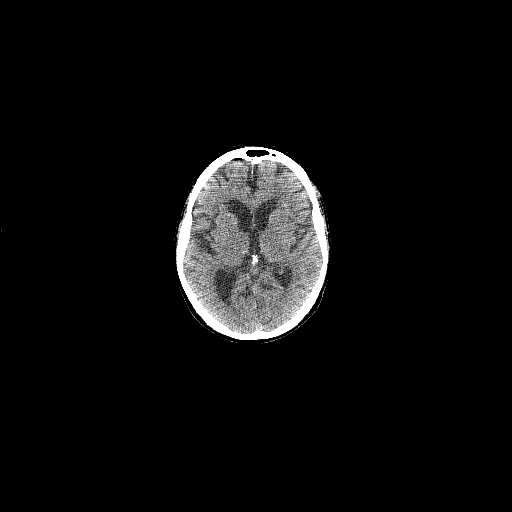

[1 of 25 positions shown; findings below may reference images not displayed]

FINDINGS: NECK

No hypermetabolic lymph nodes in the neck. There is atherosclerotic
calcification of the cavernous carotid arteries bilaterally.

CHEST

No hypermetabolic mediastinal or hilar nodes. No suspicious
pulmonary nodules on the CT data. Coronary, aortic arch, and branch
vessel atherosclerotic vascular disease. Right Port-A-Cath tip:
Right atrium. Background mediastinal activity maximum SUV

ABDOMEN/PELVIS

In the lateral segment left hepatic lobe we once again demonstrate
day irregularly calcified 8.0 by 6.1 cm mass which once again
appears photopenic. Background hepatic activity with maximum SUV
5.6.

Scattered bowel activity likely physiologic.

Thickened right inferior hemidiaphragmatic crus with a small amount
of adjacent soft tissue density, including the crus as has been
measured on prior exams this measures about 1.6 by 1.8 cm and with
maximum standard uptake value of 3.6. No new adenopathy.

Double-J ureteral stents with the proximal stent loops in the
proximal ureters rather than the renal pelvis. Scarring of the right
kidney upper pole.

No splenic hypermetabolic activity, spleen measures 11.9 by 6.4 by
13.5 cm (volume = 540 cm^3).

Aortoiliac atherosclerotic vascular disease. Scattered colonic
diverticulosis most concentrated in the sigmoid colon.

Rim sclerotic 1.4 cm lesion in the right pubic body is not
associated with hypermetabolic activity. Degenerative
anterolisthesis at L5-S1.

SKELETON

No focal hypermetabolic activity to suggest skeletal metastasis.
IMPRESSION: 1. Further reduction in the activity at the small residual right
retrocrural aortocaval density, currently [HOSPITAL] 2(previously
[HOSPITAL] 3).
2. No significant abnormal hypermetabolic activity is seen in the
neck, chest, abdomen, or pelvis.
3. Mild splenomegaly without abnormal splenic activity.
4. Stable photopenic calcified mass in the lateral segment left
hepatic lobe.
5. Atherosclerosis.
6. The proximal loops of the double-J ureteral stents are formed in
the proximal ureters.

## 2018-07-10 MED ORDER — INSULIN GLARGINE 100 UNIT/ML ~~LOC~~ SOLN
29.0000 [IU] | Freq: Every day | SUBCUTANEOUS | Status: DC
  Administered 2018-07-10 – 2018-07-13 (×4): 29 [IU] via SUBCUTANEOUS
  Filled 2018-07-10 (×4): qty 0.29

## 2018-07-10 NOTE — Progress Notes (Signed)
[place] PHYSICAL MEDICINE & REHABILITATION PROGRESS NOTE  Subjective/Complaints: Patient seen laying in bed this morning.  He states he slept well overnight.  He denies complaints.  ROS: Denies CP, SOB, nausea, vomiting, diarrhea.  Objective: Vital Signs: Blood pressure 111/66, pulse (!) 54, temperature 98.6 F (37 C), resp. rate 18, height 5\' 8"  (1.727 m), weight 69.5 kg, SpO2 96 %. No results found. Recent Labs    07/09/18 0609  WBC 6.0  HGB 10.3*  HCT 31.9*  PLT 164   No results for input(s): NA, K, CL, CO2, GLUCOSE, BUN, CREATININE, CALCIUM in the last 72 hours.  Physical Exam: BP 111/66 (BP Location: Left Arm)   Pulse (!) 54   Temp 98.6 F (37 C)   Resp 18   Ht 5\' 8"  (1.727 m)   Wt 69.5 kg   SpO2 96%   BMI 23.30 kg/m  Constitutional: No distress . Vital signs reviewed. HENT: Normocephalic.  Atraumatic. Eyes: EOMI. No discharge. Cardiovascular: RRR.  No JVD. Respiratory: CTA bilaterally.  Normal effort. GI: BS +. Non-distended. Musc: No edema or tenderness in extremities Neurologic: Alert and oriented. Motor: Bilateral upper extremities: 5/5 proximal distal Right lower extremity: Hip flexion 4--4/5, knee extension 4-/5, ankle dorsiflexion 4/5 Left lower extremity: Hip flexion 4--4/5, knee extension 4/5, ankle dorsiflexion 4+/5  Skin: Warm and dry.  Intact.  Assessment/Plan: 1. Functional deficits secondary to polytrauma which require 3+ hours per day of interdisciplinary therapy in a comprehensive inpatient rehab setting.  Physiatrist is providing close team supervision and 24 hour management of active medical problems listed below.  Physiatrist and rehab team continue to assess barriers to discharge/monitor patient progress toward functional and medical goals  Care Tool:  Bathing    Body parts bathed by patient: Right arm, Left arm, Chest, Abdomen, Front perineal area, Right upper leg, Left upper leg, Face, Buttocks, Right lower leg, Left lower leg   Body parts bathed by helper: Buttocks, Right lower leg, Left lower leg     Bathing assist Assist Level: Minimal Assistance - Patient > 75%     Upper Body Dressing/Undressing Upper body dressing   What is the patient wearing?: Pull over shirt    Upper body assist Assist Level: Supervision/Verbal cueing    Lower Body Dressing/Undressing Lower body dressing      What is the patient wearing?: Pants     Lower body assist Assist for lower body dressing: Minimal Assistance - Patient > 75%     Toileting Toileting    Toileting assist Assist for toileting: Minimal Assistance - Patient > 75% Assistive Device Comment: (have urinal set up beside the bed)   Transfers Chair/bed transfer  Transfers assist  Chair/bed transfer activity did not occur: Safety/medical concerns  Chair/bed transfer assist level: Minimal Assistance - Patient > 75%     Locomotion Ambulation   Ambulation assist   Ambulation activity did not occur: Safety/medical concerns  Assist level: Contact Guard/Touching assist Assistive device: Walker-rolling Max distance: 25'   Walk 10 feet activity   Assist  Walk 10 feet activity did not occur: Safety/medical concerns  Assist level: Contact Guard/Touching assist Assistive device: Walker-rolling   Walk 50 feet activity   Assist Walk 50 feet with 2 turns activity did not occur: Safety/medical concerns  Assist level: Contact Guard/Touching assist      Walk 150 feet activity   Assist Walk 150 feet activity did not occur: Safety/medical concerns         Walk 10 feet on uneven  surface  activity   Assist Walk 10 feet on uneven surfaces activity did not occur: Safety/medical concerns         Wheelchair     Assist   Type of Wheelchair: Manual    Wheelchair assist level: Supervision/Verbal cueing Max wheelchair distance: 150    Wheelchair 50 feet with 2 turns activity    Assist        Assist Level: Supervision/Verbal  cueing   Wheelchair 150 feet activity     Assist     Assist Level: Supervision/Verbal cueing      Medical Problem List and Plan: 1.  Functional deficits secondary to Polytrauma   Cont CIR 2.  Chronic A fib/Antithrombotics: -DVT/anticoagulation:  Pharmaceutical: Xarelto             -antiplatelet therapy: N/A 3. Pain Management: Oxycodone prn 4. Mood: LCSW to follow for evaluation and support. Mood stable on Zoloft daily             -antipsychotic agents: N/A 5. Neuropsych: This patient is capable of making decisions on his own behalf. 6. Skin/Wound Care: Routine pressure relief measures 7. Fluids/Electrolytes/Nutrition: Monitor I/O. Encourage fluid intake.  8. H/o syncope/Orthostatic hypotension: Needs to take time with transitional movements. Continue TEDs 9. T2DM: BS are poorly controlled. Was on Tujeo U-300 PTA. Will continue to monitor BS ac/hs with SSI and titrate up to home dose as indicated.   Increased lantus to 24u qhs on 3/14, increased to 29 on 3/17  10. Metastatic prostate cancer/renal stents: To follow up with Dr. Rose Phi for stent exchange after discharge. Monitor for recurrent hematuria.  11.CKD III/Anemia of chronic disease: Encourage fluids.   Creatinine 1.18 on 3/13  Labs ordered for tomorrow  Hemoglobin 10.3 on 3/16  Continue to monitor 12. Right iliac fracture: TDWB/Follow up with Dr. Percell Miller post discharge. 13. Right rib Fx/trace PTX: Encourage pulmonary toilet 14. Enterococcus UTI: Amoxicillin X 7 days as stents in place 15. Right ankle pain: Likely right ankle OA with superimposed strain  X-ray revealed soft tissue swelling and OA  Voltaren gel    Will consider splint if necessary  Improving 16.  Hypoalbuminemia  Supplement initiated on 3/13 17.  Thrombocytopenia: Resolved  Platelets 164 on 3/16  Continue to monitor  LOS: 5 days A FACE TO FACE EVALUATION WAS PERFORMED  Vineet Kinney [name] 07/10/2018, 8:14 AM

## 2018-07-10 NOTE — Progress Notes (Signed)
Physical Therapy Session Note  Patient Details  Name: BRANDIN STETZER MRN: [id number] Date of Birth: 12-17-1931  Today's Date: 07/10/2018 PT Individual Time: 0900-1000 PT Individual Time Calculation (min): 60 min   Short Term Goals: Week 1:  PT Short Term Goal 1 (Week 1): STG=LTG due to ELOS  Skilled Therapeutic Interventions/Progress Updates: Pt presented in w/v agreeable to therapy. Pt denies pain at this time. Pt propelled w/c supervision level use of BUE. Pt performed stand pivot transfer with sensor boot on and placed to second notch and pt was able to perform transfer without engaging sensor. Pt then participated in ambulation with sensor boot ambulating approx 24f with step to pattern and pt engaging sensor approx 50% more evident as pt fatigued. Pt then participated in seated/standing therex as follows:   Seated with 3lb cuff performed to fatigue LAQ Hip flexion Standing RLE only performed to fatigue with 3lb cuff SLR  hip abd/add Hip extension Hamstring curls  Pt then transported to day room and performed stand pivot to NuStep. Participated in NuStep L5 with RLE on block to avoid use and maintain wt bearing precautions. Pt perforned x 6 min for global strengthening and conditioning. Pt returned to w/c in same manner as prior and transported back to room. Pt remained in w/c at end of session and left with chair alarm on, call bell within reach and needs met.      Therapy Documentation Precautions:  Precautions Precautions: Fall Restrictions Weight Bearing Restrictions: Yes RLE Weight Bearing: Touchdown weight bearing General:   Vital Signs:      Therapy/Group: Individual Therapy  Maelle Sheaffer  Marillyn Goren, PTA  07/10/2018, 1:06 PM

## 2018-07-10 NOTE — Progress Notes (Addendum)
Physical Therapy Session Note  Patient Details  Name: [name] MRN: [id number] Date of Birth: Apr 19, 1932  Today's Date: 07/10/2018 PT Individual Time: 9628-3662 PT Individual Time Calculation (min): 25 min   Short Term Goals: Week 1:  PT Short Term Goal 1 (Week 1): STG=LTG due to ELOS  Skilled Therapeutic Interventions/Progress Updates:   Pt in w/c and agreeable to therapy, denies pain. Pt self-propelled w/c to/from therapy gym w/ supervision using BUEs. Worked on gait training w/ WB alarm shoe to train TDWB on RLE. Sit<>stand to RW w/ CGA and verbal cues for technique to maintain precautions. Ambulated 15' x3 w/ verbal cues for safety/precautions here as well, CGA to close supervision. Maintains adequate WB when cued to treat the RLE as NWB, in which pt is just touching down lightly w/ toes. Returned to room via w/c, ended session in w/c w/ all needs in reach.   Therapy Documentation Precautions:  Precautions Precautions: Fall Restrictions Weight Bearing Restrictions: No RLE Weight Bearing: Touchdown weight bearing Vital Signs: Therapy Vitals Temp: 98 F (36.7 C) Temp Source: Oral Pulse Rate: 69 Resp: 16 BP: (!) 109/58 Patient Position (if appropriate): Sitting Oxygen Therapy SpO2: 99 % O2 Device: Room Air  Therapy/Group: Individual Therapy  Eythan Jayne Clent Demark 07/10/2018, 4:32 PM

## 2018-07-10 NOTE — Progress Notes (Signed)
Occupational Therapy Session Note  Patient Details  Name: [name] MRN: [id number] Date of Birth: May 17, 1931  Today's Date: 07/10/2018 OT Individual Time: 1400-1441 OT Individual Time Calculation (min): 41 min   Short Term Goals: Week 1:  OT Short Term Goal 1 (Week 1): Pt will complete stand pivot transfer to 3:1 with min assist using the RW. OT Short Term Goal 2 (Week 1): Pt will complete LB bathing and dressing with min guard assist.  OT Short Term Goal 3 (Week 1): Pt will maintain TDWBing through the RLE during transfers and LB selfcare with no more than min instructional cueing OT Short Term Goal 4 (Week 1): Pt will perform walk-in shower with min assist using the RW.  Skilled Therapeutic Interventions/Progress Updates:    Pt greeted asleep in wc, easy to wake, and agreeable to OT treatment session.  B UE strength/coordination w/ wc propulsion to therapy gym. Worked on sit<>stands and toe taps using R LE with focus on balance and R hip extension. Pt needed CGA for balance. UB strengthening using 2 lb dowel rod. 3 sets of 10 straight arm raises, bicep curls, and chest press. Pt propelled wc back to the room with supervision. Family present and discussed home bathroom set-up with pt planning to borrow son-in-laws shower seat. Family says they will take a picture of seat so we can practice in simulated home environment. Pt left seated in wc with alarm pad on and needs met.   Therapy Documentation Precautions:  Precautions Precautions: Fall Restrictions Weight Bearing Restrictions: No RLE Weight Bearing: Touchdown weight bearing Pain: Pain Assessment Pain Scale: 0-10 Pain Score: 0-No pain  Therapy/Group: Individual Therapy  Valma Cava 07/10/2018, 2:42 PM

## 2018-07-10 NOTE — Progress Notes (Signed)
Occupational Therapy Session Note  Patient Details  Name: [name] MRN: [id number] Date of Birth: 11-Dec-1931  Today's Date: 07/10/2018 OT Individual Time: 0805-0900 OT Individual Time Calculation (min): 55 min    Short Term Goals: Week 1:  OT Short Term Goal 1 (Week 1): Pt will complete stand pivot transfer to 3:1 with min assist using the RW. OT Short Term Goal 2 (Week 1): Pt will complete LB bathing and dressing with min guard assist.  OT Short Term Goal 3 (Week 1): Pt will maintain TDWBing through the RLE during transfers and LB selfcare with no more than min instructional cueing OT Short Term Goal 4 (Week 1): Pt will perform walk-in shower with min assist using the RW.  Skilled Therapeutic Interventions/Progress Updates:    Pt completed bathing and dressing sit to stand at the sink during session.  Min instructional cueing with min guard assist needed for transfer from the EOB to the wheelchair as well as for all sit to stand transitions.  He was able to complete all bathing and dressing of UB with supervision with min guard for LB dressing as well.  Had pt complete short distance functional mobility with use of the RW and force guard on the RLE to monitor weight bearing.  He was able to maintain TDWBing 60% of the time with distances of 10, 25, and 10 ft.  Finished session with pt up in the wheelchair with call button and phone in reach and chair alarm in place.    Therapy Documentation Precautions:  Precautions Precautions: Fall Restrictions Weight Bearing Restrictions: Yes RLE Weight Bearing: Touchdown weight bearing  Pain: Pain Assessment Pain Scale: 0-10 Pain Score: 0-No pain Faces Pain Scale: Hurts a little bit Pain Type: Acute pain Pain Location: Rib cage Pain Orientation: Right Pain Descriptors / Indicators: Discomfort Pain Onset: With Activity Pain Intervention(s): Repositioned ADL: See Care Tool Section for some details of ADL Therapy/Group: Individual  Therapy  Ivey Cina OTR/L 07/10/2018, 12:28 PM

## 2018-07-10 NOTE — Progress Notes (Signed)
Occupational Therapy Session Note  Patient Details  Name: [name] MRN: [id number] Date of Birth: 02-03-1932  Today's Date: 07/10/2018 OT Individual Time: 1100-1130 OT Individual Time Calculation (min): 30 min    Short Term Goals: Week 1:  OT Short Term Goal 1 (Week 1): Pt will complete stand pivot transfer to 3:1 with min assist using the RW. OT Short Term Goal 2 (Week 1): Pt will complete LB bathing and dressing with min guard assist.  OT Short Term Goal 3 (Week 1): Pt will maintain TDWBing through the RLE during transfers and LB selfcare with no more than min instructional cueing OT Short Term Goal 4 (Week 1): Pt will perform walk-in shower with min assist using the RW.  Skilled Therapeutic Interventions/Progress Updates:    Session focused on adherence to TDWB R LE and B UE strengthening. Pt completed w/c propulsion 150 ft x2 during session with (S). A weightbearing sensor shoe was fastened to provide auditory feedback re weight bearing precautions. Pt frequently required cues to adhere. Pt completed standing level reaching task with ~50% adherence overall and unilateral support on RW. Pt completed B UE strengthening circuit with a 3lb dowel and frequent cueing for technique. Pt returned to room and left sitting up in w/c with chair pad alarm set.   Therapy Documentation Precautions:  Precautions Precautions: Fall Restrictions Weight Bearing Restrictions: No RLE Weight Bearing: Weight bearing as tolerated Pain: Pain Assessment Pain Scale: 0-10 Pain Score: 0-No pain   Therapy/Group: Individual Therapy  Curtis Sites 07/10/2018, 12:21 PM

## 2018-07-10 NOTE — Plan of Care (Signed)
  Problem: Consults Goal: RH GENERAL PATIENT EDUCATION Description See Patient Education module for education specifics. Outcome: Progressing Goal: Skin Care Protocol Initiated - if Braden Score 18 or less Description If consults are not indicated, leave blank or document N/A Outcome: Progressing Goal: Diabetes Guidelines if Diabetic/Glucose > 140 Description If diabetic or lab glucose is > 140 mg/dl - Initiate Diabetes/Hyperglycemia Guidelines & Document Interventions  Outcome: Progressing   Problem: RH BOWEL ELIMINATION Goal: RH STG MANAGE BOWEL WITH ASSISTANCE Description STG Manage Bowel with mod I Assistance.  Outcome: Progressing Goal: RH STG MANAGE BOWEL W/MEDICATION W/ASSISTANCE Description STG Manage Bowel with Medication with mod I Assistance.  Outcome: Progressing   Problem: RH BLADDER ELIMINATION Goal: RH STG MANAGE BLADDER WITH ASSISTANCE Description STG Manage Bladder With mod I Assistance  Outcome: Progressing   Problem: RH SKIN INTEGRITY Goal: RH STG SKIN FREE OF INFECTION/BREAKDOWN Description Patients skin will remain free from further breakdown or infection with min assist.  Outcome: Progressing Goal: RH STG MAINTAIN SKIN INTEGRITY WITH ASSISTANCE Description STG Maintain Skin Integrity With min Assistance.  Outcome: Progressing   Problem: RH SAFETY Goal: RH STG ADHERE TO SAFETY PRECAUTIONS W/ASSISTANCE/DEVICE Description STG Adhere to Safety Precautions With supervision Assistance/Device.  Outcome: Progressing   Problem: RH PAIN MANAGEMENT Goal: RH STG PAIN MANAGED AT OR BELOW PT'S PAIN GOAL Description < 4  Outcome: Progressing

## 2018-07-11 ENCOUNTER — Inpatient Hospital Stay (HOSPITAL_COMMUNITY): Payer: BLUE CROSS/BLUE SHIELD

## 2018-07-11 ENCOUNTER — Inpatient Hospital Stay (HOSPITAL_COMMUNITY): Payer: BLUE CROSS/BLUE SHIELD | Admitting: *Deleted

## 2018-07-11 DIAGNOSIS — N390 Urinary tract infection, site not specified: Secondary | ICD-10-CM

## 2018-07-11 LAB — BASIC METABOLIC PANEL
Anion gap: 5 (ref 5–15)
BUN: 44 mg/dL — AB (ref 8–23)
CO2: 25 mmol/L (ref 22–32)
Calcium: 9 mg/dL (ref 8.9–10.3)
Chloride: 105 mmol/L (ref 98–111)
Creatinine, Ser: 1.29 mg/dL — ABNORMAL HIGH (ref 0.61–1.24)
GFR calc Af Amer: 59 mL/min — ABNORMAL LOW (ref >60)
GFR calc non Af Amer: 51 mL/min — ABNORMAL LOW (ref >60)
Glucose, Bld: 168 mg/dL — ABNORMAL HIGH (ref 70–99)
Potassium: 3.9 mmol/L (ref 3.5–5.1)
Sodium: 135 mmol/L (ref 135–145)

## 2018-07-11 LAB — GLUCOSE, CAPILLARY
Glucose-Capillary: 165 mg/dL — ABNORMAL HIGH (ref 70–99)
Glucose-Capillary: 150 mg/dL — ABNORMAL HIGH (ref 70–99)
Glucose-Capillary: 152 mg/dL — ABNORMAL HIGH (ref 70–99)
Glucose-Capillary: 199 mg/dL — ABNORMAL HIGH (ref 70–99)

## 2018-07-11 NOTE — Progress Notes (Signed)
[place] PHYSICAL MEDICINE & REHABILITATION PROGRESS NOTE  Subjective/Complaints: Patient seen laying in bed this morning.  He states he slept well overnight.  ROS: Denies CP, SOB, nausea, vomiting, diarrhea.  Objective: Vital Signs: Blood pressure 138/67, pulse 78, temperature 98 F (36.7 C), temperature source Oral, resp. rate 19, height 5\' 8"  (1.727 m), weight 70 kg, SpO2 99 %. No results found. Recent Labs    07/09/18 0609  WBC 6.0  HGB 10.3*  HCT 31.9*  PLT 164   Recent Labs    07/11/18 0504  NA 135  K 3.9  CL 105  CO2 25  GLUCOSE 168*  BUN 44*  CREATININE 1.29*  CALCIUM 9.0    Physical Exam: BP 138/67 (BP Location: Left Arm)   Pulse 78   Temp 98 F (36.7 C) (Oral)   Resp 19   Ht 5\' 8"  (1.727 m)   Wt 70 kg   SpO2 99%   BMI 23.46 kg/m  Constitutional: No distress . Vital signs reviewed. HENT: Normocephalic.  Atraumatic. Eyes: EOMI. No discharge. Cardiovascular: RRR.  No JVD. Respiratory: CTA bilaterally.  Normal effort. GI: BS +. Non-distended. Musc: No edema or tenderness in extremities Neurologic: Alert and oriented. Motor: Right lower extremity: Hip flexion 4/5, knee extension 4/5, ankle dorsiflexion 4/5 Left lower extremity: Hip flexion 4/5, knee extension 4/5, ankle dorsiflexion 4+/5  Skin: Frail skin- scattered dressing in place.  Assessment/Plan: 1. Functional deficits secondary to polytrauma which require 3+ hours per day of interdisciplinary therapy in a comprehensive inpatient rehab setting.  Physiatrist is providing close team supervision and 24 hour management of active medical problems listed below.  Physiatrist and rehab team continue to assess barriers to discharge/monitor patient progress toward functional and medical goals  Care Tool:  Bathing    Body parts bathed by patient: Right arm, Left arm, Chest, Abdomen, Front perineal area, Right upper leg, Left upper leg, Face, Buttocks, Right lower leg, Left lower leg   Body parts  bathed by helper: Buttocks, Right lower leg, Left lower leg     Bathing assist Assist Level: Contact Guard/Touching assist     Upper Body Dressing/Undressing Upper body dressing   What is the patient wearing?: Pull over shirt    Upper body assist Assist Level: Supervision/Verbal cueing    Lower Body Dressing/Undressing Lower body dressing      What is the patient wearing?: Pants     Lower body assist Assist for lower body dressing: Contact Guard/Touching assist     Toileting Toileting    Toileting assist Assist for toileting: Minimal Assistance - Patient > 75% Assistive Device Comment: (have urinal set up beside the bed)   Transfers Chair/bed transfer  Transfers assist  Chair/bed transfer activity did not occur: Safety/medical concerns  Chair/bed transfer assist level: Contact Guard/Touching assist     Locomotion Ambulation   Ambulation assist   Ambulation activity did not occur: Safety/medical concerns  Assist level: Contact Guard/Touching assist Assistive device: Walker-rolling Max distance: 15'   Walk 10 feet activity   Assist  Walk 10 feet activity did not occur: Safety/medical concerns  Assist level: Contact Guard/Touching assist Assistive device: Walker-rolling   Walk 50 feet activity   Assist Walk 50 feet with 2 turns activity did not occur: Safety/medical concerns  Assist level: Contact Guard/Touching assist      Walk 150 feet activity   Assist Walk 150 feet activity did not occur: Safety/medical concerns         Walk 10 feet on  uneven surface  activity   Assist Walk 10 feet on uneven surfaces activity did not occur: Safety/medical concerns         Wheelchair     Assist   Type of Wheelchair: Manual    Wheelchair assist level: Supervision/Verbal cueing Max wheelchair distance: 150    Wheelchair 50 feet with 2 turns activity    Assist        Assist Level: Supervision/Verbal cueing   Wheelchair 150  feet activity     Assist     Assist Level: Supervision/Verbal cueing      Medical Problem List and Plan: 1.  Functional deficits secondary to [place]  Team conference today to discuss current and goals and coordination of care, home and environmental barriers, and discharge planning with nursing, case manager, and therapies.  2.  Chronic A fib/Antithrombotics: -DVT/anticoagulation:  Pharmaceutical: Xarelto             -antiplatelet therapy: N/A 3. Pain Management: Oxycodone prn 4. Mood: LCSW to follow for evaluation and support. Mood stable on Zoloft daily             -antipsychotic agents: N/A 5. Neuropsych: This patient is capable of making decisions on his own behalf. 6. Skin/Wound Care: Routine pressure relief measures 7. Fluids/Electrolytes/Nutrition: Monitor I/O. Encourage fluid intake.  8. H/o syncope/Orthostatic hypotension: Needs to take time with transitional movements. Continue TEDs 9. T2DM: BS are poorly controlled. Was on Tujeo U-300 PTA. Will continue to monitor BS ac/hs with SSI and titrate up to home dose as indicated.   Increased lantus to 24u qhs on 3/14, increased to 29 on 3/17   Elevated, but improving on 3/18 10. Metastatic prostate cancer/renal stents: To follow up with Dr. Rose Phi for stent exchange after discharge. Monitor for recurrent hematuria.  11.CKD III/Anemia of chronic disease: Encourage fluids.   Creatinine 1.29 on 3/18  Encourage fluids  Hemoglobin 10.3 on 3/16  Continue to monitor 12. Right iliac fracture: TDWB/Follow up with Dr. Percell Miller post discharge. 13. Right rib Fx/trace PTX: Encourage pulmonary toilet 14. Enterococcus UTI: Amoxicillin X 7 days (through 3/21) as stents in place 15. Right ankle pain: Likely right ankle OA with superimposed strain  X-ray revealed soft tissue swelling and OA  Voltaren gel    Will consider splint if necessary  Improving 16.  Hypoalbuminemia  Supplement initiated on 3/13 17.  Thrombocytopenia:  Resolved  Platelets 164 on 3/16  Continue to monitor  LOS: 6 days A FACE TO FACE EVALUATION WAS PERFORMED  [name] [name] 07/11/2018, 9:16 AM

## 2018-07-11 NOTE — Progress Notes (Signed)
Occupational Therapy Session Note  Patient Details  Name: [name] MRN: [id number] Date of Birth: [birth year]  Today's Date: 07/11/2018 OT Individual Time: 1500-1555 OT Individual Time Calculation (min): 55 min    Short Term Goals: Week 1:  OT Short Term Goal 1 (Week 1): Pt will complete stand pivot transfer to 3:1 with min assist using the RW. OT Short Term Goal 2 (Week 1): Pt will complete LB bathing and dressing with min guard assist.  OT Short Term Goal 3 (Week 1): Pt will maintain TDWBing through the RLE during transfers and LB selfcare with no more than min instructional cueing OT Short Term Goal 4 (Week 1): Pt will perform walk-in shower with min assist using the RW.  Skilled Therapeutic Interventions/Progress Updates:     1;1. Pt received in w/c with no c/o pain. Pt completes w/c propulsion to/from outside courtyard >538fet for BUE strengthening and endurance with only 1 rest break. Pt completes 3x30 beach ball volley with 4# dowel rod for BUE strengthening required for BADLs. Pt completes 2x15 UB therex circuit with min instructional cues for BUE strengthening. Pt completes 2x15 foot walk with RW and VC for WB precautions on uneven surfaces in prep for community mobility. Exited session with pt seated in w/c, call light in reach and all needs met  Therapy Documentation Precautions:  Precautions Precautions: Fall Restrictions Weight Bearing Restrictions: (P) Yes RLE Weight Bearing: Touchdown weight bearing  Therapy/Group: Individual Therapy  STonny Branch3/18/2020, 4:13 PM

## 2018-07-11 NOTE — Plan of Care (Signed)
  Problem: Consults Goal: RH GENERAL PATIENT EDUCATION Description See Patient Education module for education specifics. Outcome: Progressing Goal: Skin Care Protocol Initiated - if Braden Score 18 or less Description If consults are not indicated, leave blank or document N/A Outcome: Progressing Goal: Diabetes Guidelines if Diabetic/Glucose > 140 Description If diabetic or lab glucose is > 140 mg/dl - Initiate Diabetes/Hyperglycemia Guidelines & Document Interventions  Outcome: Progressing   Problem: RH BOWEL ELIMINATION Goal: RH STG MANAGE BOWEL WITH ASSISTANCE Description STG Manage Bowel with mod I Assistance.  Outcome: Progressing Goal: RH STG MANAGE BOWEL W/MEDICATION W/ASSISTANCE Description STG Manage Bowel with Medication with mod I Assistance.  Outcome: Progressing   Problem: RH BLADDER ELIMINATION Goal: RH STG MANAGE BLADDER WITH ASSISTANCE Description STG Manage Bladder With mod I Assistance  Outcome: Progressing   Problem: RH SKIN INTEGRITY Goal: RH STG SKIN FREE OF INFECTION/BREAKDOWN Description Patients skin will remain free from further breakdown or infection with min assist.  Outcome: Progressing Goal: RH STG MAINTAIN SKIN INTEGRITY WITH ASSISTANCE Description STG Maintain Skin Integrity With min Assistance.  Outcome: Progressing   Problem: RH SAFETY Goal: RH STG ADHERE TO SAFETY PRECAUTIONS W/ASSISTANCE/DEVICE Description STG Adhere to Safety Precautions With supervision Assistance/Device.  Outcome: Progressing   Problem: RH PAIN MANAGEMENT Goal: RH STG PAIN MANAGED AT OR BELOW PT'S PAIN GOAL Description < 4  Outcome: Progressing
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
## 2018-07-11 NOTE — Progress Notes (Signed)
Occupational Therapy Session Note  Patient Details  Name: [name] MRN: [id number] Date of Birth: February 01, 1932  Today's Date: 07/11/2018 OT Individual Time: 8088-1103 OT Individual Time Calculation (min): 71 min    Short Term Goals: Week 1:  OT Short Term Goal 1 (Week 1): Pt will complete stand pivot transfer to 3:1 with min assist using the RW. OT Short Term Goal 2 (Week 1): Pt will complete LB bathing and dressing with min guard assist.  OT Short Term Goal 3 (Week 1): Pt will maintain TDWBing through the RLE during transfers and LB selfcare with no more than min instructional cueing OT Short Term Goal 4 (Week 1): Pt will perform walk-in shower with min assist using the RW.  Skilled Therapeutic Interventions/Progress Updates:    1;1. Pt received in w/c reporting tenderness in ribs, but declining medication. Pt ready to wash up this morning at sink level. Pt completes UB bathing/dressing with set up in w/c. Pt completes LB bathing and dressing with min A for steadying during advancing pants past hips/washing buttocks and VC for sitting in w/c to wash B lower legs/feet. Pt grooms in standing/sitting with S for static standing at sink and VC for WB precautions. Pt completes shaving in w/c at sink. Pt dons footwear with set up. Pt completes ambulatory transfer with RW and CGA for walk in shower transfer iwht VC for sequencing stepping with LE for WB precautions. Pt completes standing balance activity reacing mod ranges outside BOS to gain bean bag and throw at target. Exited session with pt setaed in w/c,c al light in reach and all need smet   Therapy Documentation Precautions:  Precautions Precautions: Fall Restrictions Weight Bearing Restrictions: (P) Yes RLE Weight Bearing: Touchdown weight bearing General:     Therapy/Group: Individual Therapy  Tonny Branch 07/11/2018, 11:00 AM

## 2018-07-11 NOTE — Progress Notes (Signed)
Physical Therapy Session Note  Patient Details  Name: [name] MRN: [id number] Date of Birth: [birth year]  Today's Date: 07/11/2018 PT Individual Time: 0830-0930 PT Individual Time Calculation (min): 60 min   Short Term Goals: Week 1:  PT Short Term Goal 1 (Week 1): STG=LTG due to ELOS  Skilled Therapeutic Interventions/Progress Updates:    Patient in supine in room, supine to sit with S.  Transfer to w/c with RW and min A.  Propelled w/c with S to gym.  Applied boot to R foot for input regarding weight bearing.  Gait with RW and min A x 60' cues for step length, UE use, and to limit weight on R LE.  Patient performed sit to stand x 5 with cues for technique and min A.  Standing tolerance/balance while putting puzzle together with 1 UE support on walker and minguard to S assist.  Patient propelled to ADL apartment with S.  Gait x 40' in apartment with RW and min A over carpet cues for stride length and UE use, weight bearing on R.  Transfer to recliner chair min A.  Then gait around bed with RW 20' min A.  Sit <>supine on regular bed (higher than his at home) with S.  Gait x 40' into hallway min A prior to beginning to consistently put more weight on R LE.  Propelled to room and left in w/c with alarm belt and needs in reach.  Therapy Documentation Precautions:  Precautions Precautions: Fall Restrictions Weight Bearing Restrictions: (P) Yes RLE Weight Bearing: Touchdown weight bearing Pain: Pain Assessment Faces Pain Scale: Hurts little more Pain Location: Rib cage Pain Orientation: Right Pain Onset: With Activity Pain Intervention(s): Repositioned    Therapy/Group: Individual Therapy  Reginia Naas  Magda Kiel, PT 07/11/2018, 12:46 PM

## 2018-07-12 ENCOUNTER — Inpatient Hospital Stay (HOSPITAL_COMMUNITY): Payer: BLUE CROSS/BLUE SHIELD | Admitting: Physical Therapy

## 2018-07-12 ENCOUNTER — Inpatient Hospital Stay (HOSPITAL_COMMUNITY): Payer: BLUE CROSS/BLUE SHIELD | Admitting: Occupational Therapy

## 2018-07-12 LAB — GLUCOSE, CAPILLARY
Glucose-Capillary: 180 mg/dL — ABNORMAL HIGH (ref 70–99)
Glucose-Capillary: 127 mg/dL — ABNORMAL HIGH (ref 70–99)
Glucose-Capillary: 145 mg/dL — ABNORMAL HIGH (ref 70–99)
Glucose-Capillary: 228 mg/dL — ABNORMAL HIGH (ref 70–99)

## 2018-07-12 NOTE — Progress Notes (Signed)
Physical Therapy Session Note  Patient Details  Name: [name] MRN: [id number] Date of Birth: 03-24-1932  Today's Date: 07/12/2018 PT Individual Time: 1000-1100 PT Individual Time Calculation (min): 60 min   Short Term Goals: Week 1:  PT Short Term Goal 1 (Week 1): STG=LTG due to ELOS  Skilled Therapeutic Interventions/Progress Updates:   Pt in w/c and agreeable to therapy, pain as detailed below. Pt self-propelled w/c to/from day room w/ supervision using BUEs. Worked on functional mobility this session w/ WB alarm shoe on RLE to cue pt when he is putting more than TDWB on RLE in stance. Blocked practice of sit<>stands to RW, 2x5 reps. Ambulated 30' x2 w/ RW and practiced side-stepping w/ RW in case he needs to get through a narrow doorway at home. Performed all functional mobility w/ supervision and alarm shoe only went off towards end of gait bout w/ fatigue. Educated pt on distance he can safely go before he starts to fatigue and put too much weight on RLE. Instructed pt on HEP to perform once he discharges to home. Performed 1 set of 10 reps of R knee marches, R heel slides, R LAQs, standing R hip abduction, and standing R knee flexion. Additionally educated on performing static hamstring and gastroc stretch. Pt returned demonstration of all exercises and stretches correctly and w/o pain. Provided w/ written handout and instructed to perform 3 sets of 10 of exercises, 5 min hold x3 or hamstring stretch, and 30 sec hold x3 of gastroc stretch using a towel. Returned to room and ended session in w/c, all needs in reach.   Therapy Documentation Precautions:  Precautions Precautions: Fall Restrictions Weight Bearing Restrictions: Yes RLE Weight Bearing: Touchdown weight bearing Pain: Pain Assessment Pain Scale: 0-10 Pain Score: 4  Pain Type: Acute pain Pain Location: Rib cage Pain Orientation: Right Pain Descriptors / Indicators: Aching;Discomfort Pain Frequency:  Intermittent Pain Onset: With Activity Patients Stated Pain Goal: 2 Pain Intervention(s): Medication (See eMAR)(oxycodone) Multiple Pain Sites: No  Therapy/Group: Individual Therapy  Ermon Sagan K Smantha Boakye 07/12/2018, 11:30 AM

## 2018-07-12 NOTE — Plan of Care (Signed)
  Problem: Consults Goal: RH GENERAL PATIENT EDUCATION Description See Patient Education module for education specifics. Outcome: Progressing Goal: Skin Care Protocol Initiated - if Braden Score 18 or less Description If consults are not indicated, leave blank or document N/A Outcome: Progressing Goal: Diabetes Guidelines if Diabetic/Glucose > 140 Description If diabetic or lab glucose is > 140 mg/dl - Initiate Diabetes/Hyperglycemia Guidelines & Document Interventions  Outcome: Progressing   Problem: RH BOWEL ELIMINATION Goal: RH STG MANAGE BOWEL WITH ASSISTANCE Description STG Manage Bowel with mod I Assistance.  Outcome: Progressing Goal: RH STG MANAGE BOWEL W/MEDICATION W/ASSISTANCE Description STG Manage Bowel with Medication with mod I Assistance.  Outcome: Progressing   Problem: RH BLADDER ELIMINATION Goal: RH STG MANAGE BLADDER WITH ASSISTANCE Description STG Manage Bladder With mod I Assistance  Outcome: Progressing   Problem: RH SKIN INTEGRITY Goal: RH STG SKIN FREE OF INFECTION/BREAKDOWN Description Patients skin will remain free from further breakdown or infection with min assist.  Outcome: Progressing Goal: RH STG MAINTAIN SKIN INTEGRITY WITH ASSISTANCE Description STG Maintain Skin Integrity With min Assistance.  Outcome: Progressing   Problem: RH SAFETY Goal: RH STG ADHERE TO SAFETY PRECAUTIONS W/ASSISTANCE/DEVICE Description STG Adhere to Safety Precautions With supervision Assistance/Device.  Outcome: Progressing   Problem: RH PAIN MANAGEMENT Goal: RH STG PAIN MANAGED AT OR BELOW PT'S PAIN GOAL Description < 4  Outcome: Progressing

## 2018-07-12 NOTE — Progress Notes (Signed)
[facility] PHYSICAL MEDICINE & REHABILITATION PROGRESS NOTE  Subjective/Complaints: Pt without complaints. Good appetite. Says pain is controlled  ROS: Patient denies fever, rash, sore throat, blurred vision, nausea, vomiting, diarrhea, cough, shortness of breath or chest pain, joint or back pain, headache, or mood change.   Objective: Vital Signs: Blood pressure 122/62, pulse (!) 53, temperature 97.6 F (36.4 C), temperature source Oral, resp. rate 16, height 5\' 8"  (1.727 m), weight 70 kg, SpO2 97 %. No results found. No results for input(s): WBC, HGB, HCT, PLT in the last 72 hours. Recent Labs    07/11/18 0504  NA 135  K 3.9  CL 105  CO2 25  GLUCOSE 168*  BUN 44*  CREATININE 1.29*  CALCIUM 9.0    Physical Exam: BP 122/62 (BP Location: Left Arm)   Pulse (!) 53   Temp 97.6 F (36.4 C) (Oral)   Resp 16   Ht 5\' 8"  (1.727 m)   Wt 70 kg   SpO2 97%   BMI 23.46 kg/m  Constitutional: No distress . Vital signs reviewed. HEENT: EOMI, oral membranes moist Neck: supple Cardiovascular: RRR without murmur. No JVD    Respiratory: CTA Bilaterally without wheezes or rales. Normal effort    GI: BS +, non-tender, non-distended .   Musc: No edema or tenderness in extremities Neurologic: Alert and oriented. Motor: Right lower extremity: Hip flexion 4/5, knee extension 4/5, ankle dorsiflexion 4/5 Left lower extremity: Hip flexion 4/5, knee extension 4/5, ankle dorsiflexion 4+/5  Skin: Frail skin- left forearm wounds healing nicely, superficial, right forearm wounds still weeping a bit.   Assessment/Plan: 1. Functional deficits secondary to polytrauma which require 3+ hours per day of interdisciplinary therapy in a comprehensive inpatient rehab setting.  Physiatrist is providing close team supervision and 24 hour management of active medical problems listed below.  Physiatrist and rehab team continue to assess barriers to discharge/monitor patient progress toward functional and  medical goals  Care Tool:  Bathing    Body parts bathed by patient: Right arm, Left arm, Chest, Abdomen, Front perineal area, Right upper leg, Left upper leg, Face, Buttocks, Right lower leg, Left lower leg   Body parts bathed by helper: Buttocks, Right lower leg, Left lower leg     Bathing assist Assist Level: Contact Guard/Touching assist     Upper Body Dressing/Undressing Upper body dressing   What is the patient wearing?: Pull over shirt    Upper body assist Assist Level: Supervision/Verbal cueing    Lower Body Dressing/Undressing Lower body dressing      What is the patient wearing?: Pants     Lower body assist Assist for lower body dressing: Contact Guard/Touching assist     Toileting Toileting    Toileting assist Assist for toileting: Minimal Assistance - Patient > 75% Assistive Device Comment: (have urinal set up beside the bed)   Transfers Chair/bed transfer  Transfers assist  Chair/bed transfer activity did not occur: Safety/medical concerns  Chair/bed transfer assist level: Supervision/Verbal cueing     Locomotion Ambulation   Ambulation assist   Ambulation activity did not occur: Safety/medical concerns  Assist level: Supervision/Verbal cueing Assistive device: Walker-rolling Max distance: 30'   Walk 10 feet activity   Assist  Walk 10 feet activity did not occur: Safety/medical concerns  Assist level: Minimal Assistance - Patient > 75% Assistive device: Walker-rolling   Walk 50 feet activity   Assist Walk 50 feet with 2 turns activity did not occur: Safety/medical concerns  Assist level: Minimal Assistance -  Patient > 75%      Walk 150 feet activity   Assist Walk 150 feet activity did not occur: Safety/medical concerns         Walk 10 feet on uneven surface  activity   Assist Walk 10 feet on uneven surfaces activity did not occur: Safety/medical concerns         Wheelchair     Assist   Type of Wheelchair:  Manual    Wheelchair assist level: Supervision/Verbal cueing Max wheelchair distance: 150    Wheelchair 50 feet with 2 turns activity    Assist        Assist Level: Supervision/Verbal cueing   Wheelchair 150 feet activity     Assist     Assist Level: Supervision/Verbal cueing      Medical Problem List and Plan: 1.  Functional deficits secondary to Polytrauma   Cont CIR, PT, OT 2.  Chronic A fib/Antithrombotics: -DVT/anticoagulation:  Pharmaceutical: Xarelto             -antiplatelet therapy: N/A 3. Pain Management: Oxycodone prn 4. Mood: LCSW to follow for evaluation and support. Mood stable on Zoloft daily             -antipsychotic agents: N/A 5. Neuropsych: This patient is capable of making decisions on his own behalf. 6. Skin/Wound Care: Routine pressure relief measures  -change right forearm to foam dressing, spoke with RN 7. Fluids/Electrolytes/Nutrition: Monitor I/O. Encourage fluid intake.  8. H/o syncope/Orthostatic hypotension: Needs to take time with transitional movements. Continue TEDs 9. T2DM: BS are poorly controlled. Was on Tujeo U-300 PTA. Will continue to monitor BS ac/hs with SSI and titrate up to home dose as indicated.   Increased lantus to 24u qhs on 3/14, increased to 29 on 3/17   Still elevated. Increase lantus to 33 units 3/19 10. Metastatic prostate cancer/renal stents: To follow up with Dr. Rose Phi for stent exchange after discharge. Monitor for recurrent hematuria.  11.CKD III/Anemia of chronic disease: Encourage fluids.   Creatinine 1.29 on 3/18  Encourage fluids  Hemoglobin 10.3 on 3/16  Continue to monitor 12. Right iliac fracture: TDWB/Follow up with Dr. Percell Miller post discharge. 13. Right rib Fx/trace PTX: Encourage pulmonary toilet 14. Enterococcus UTI: Amoxicillin X 7 days (through 3/21) as stents in place 15. Right ankle pain: Likely right ankle OA with superimposed strain  X-ray revealed soft tissue swelling and OA  Voltaren  gel    Will consider splint if necessary  Pt states pain is improving 16.  Hypoalbuminemia  Supplement initiated on 3/13 17.  Thrombocytopenia: Resolved  Platelets 164 on 3/16  Continue to monitor  LOS: 7 days A FACE TO FACE EVALUATION WAS PERFORMED  Meredith Staggers 07/12/2018, 11:34 AM

## 2018-07-12 NOTE — Progress Notes (Signed)
Occupational Therapy Session Note  Patient Details  Name: [name] MRN: [id number] Date of Birth: [birth year]  Today's Date: 07/12/2018  Session 1 OT Individual Time: 6314-9702 OT Individual Time Calculation (min): 57 min   Session 2 OT Individual Time: 1415-1530 OT Individual Time Calculation (min): 75 min    Short Term Goals: Week 1:  OT Short Term Goal 1 (Week 1): Pt will complete stand pivot transfer to 3:1 with min assist using the RW. OT Short Term Goal 2 (Week 1): Pt will complete LB bathing and dressing with min guard assist.  OT Short Term Goal 3 (Week 1): Pt will maintain TDWBing through the RLE during transfers and LB selfcare with no more than min instructional cueing OT Short Term Goal 4 (Week 1): Pt will perform walk-in shower with min assist using the RW.  Skilled Therapeutic Interventions/Progress Updates:  Session 1   Pt greeted semi-reclined in bed asleep, easy to wake, and agreeable to OT treatment session. Pt ambulated to bathroom with verbal cues to maintain TDWB R LE. Pt  Voided bladder seated on commode with CGA for balance when managing clothes. Pt then ambulated out of bathroom in similar fashion with RW and CGA. Discussed limiting ambulation 2/2 difficulty maintaining TDWB precautions. Worked on standing balance/endurance with standing grooming tasks at the sink. B UE strength/endurance w/ wc propulsion to therapy gym. Worked on standing balance/endurance with reaching into RW bag and reaching outside base of support to place clothes pins on basketball net. Pt completed activity 3x with rest breaks in between. Pt returned to room at end of session in wc and left seated in wc with chair alarm on and needs met.   Session 2 Pt greeted sitting in recliner with daughter present and agreeable to OT treatment session focused on pt/family education, dc planning, and UB strengthening. Pt reported need for bathroom so had pt's daughter assist with ambulation in/out of  bathroom with simulated toilet height. Pt able o manage clothing w/ CGA and verbal cues for TDWB precautions. Pt voided bladder successfully, then requested to change pants while seated on commode. Pt able to thread pants without OT assist. Discussed use of BSC over toilet at home to give pt a place to push off from. Educated pt's daughter on functional ambulation in the room and she practiced this with patient. Pt's daughter showed OT pictures of tub bench they have for pt and practcied walk-in shower transfer using tub bench and sliding in and out of shower. Discussed home set-up and modifications for safe BADL participation. Pt then completed 15 mins on [employer] arm bike for UB strengthening/endurance. Pt took 3 rest breaks within 15 minute span. Pt returned to room and left seated in wc with chair alarm on and daughter present.   Therapy Documentation Precautions:  Precautions Precautions: Fall Restrictions Weight Bearing Restrictions: Yes RLE Weight Bearing: Touchdown weight bearing Pain:    Therapy/Group: Individual Therapy  Valma Cava 07/12/2018, 3:34 PM

## 2018-07-13 ENCOUNTER — Inpatient Hospital Stay (HOSPITAL_COMMUNITY): Payer: BLUE CROSS/BLUE SHIELD | Admitting: Occupational Therapy

## 2018-07-13 ENCOUNTER — Inpatient Hospital Stay (HOSPITAL_COMMUNITY): Payer: BLUE CROSS/BLUE SHIELD | Admitting: Physical Therapy

## 2018-07-13 LAB — BASIC METABOLIC PANEL
Anion gap: 7 (ref 5–15)
BUN: 43 mg/dL — ABNORMAL HIGH (ref 8–23)
CO2: 27 mmol/L (ref 22–32)
Calcium: 9.6 mg/dL (ref 8.9–10.3)
Chloride: 101 mmol/L (ref 98–111)
Creatinine, Ser: 1.43 mg/dL — ABNORMAL HIGH (ref 0.61–1.24)
GFR calc Af Amer: 52 mL/min — ABNORMAL LOW (ref >60)
GFR calc non Af Amer: 45 mL/min — ABNORMAL LOW (ref >60)
GLUCOSE: 162 mg/dL — AB (ref 70–99)
Potassium: 4.3 mmol/L (ref 3.5–5.1)
Sodium: 135 mmol/L (ref 135–145)

## 2018-07-13 LAB — GLUCOSE, CAPILLARY
Glucose-Capillary: 123 mg/dL — ABNORMAL HIGH (ref 70–99)
Glucose-Capillary: 116 mg/dL — ABNORMAL HIGH (ref 70–99)
Glucose-Capillary: 127 mg/dL — ABNORMAL HIGH (ref 70–99)
Glucose-Capillary: 194 mg/dL — ABNORMAL HIGH (ref 70–99)

## 2018-07-13 MED ORDER — BACITRACIN ZINC 500 UNIT/GM EX OINT: TOPICAL_OINTMENT | Freq: Two times a day (BID) | CUTANEOUS | 0 refills | Status: DC

## 2018-07-13 MED ORDER — INSULIN GLARGINE (2 UNIT DIAL) 300 UNIT/ML ~~LOC~~ SOPN: 10.0000 [IU] | PEN_INJECTOR | Freq: Every day | SUBCUTANEOUS | Status: DC

## 2018-07-13 MED ORDER — DICLOFENAC SODIUM 1 % TD GEL: 2.0000 g | Freq: Four times a day (QID) | TRANSDERMAL | 0 refills | Status: DC

## 2018-07-13 MED ORDER — DOCUSATE SODIUM 100 MG PO CAPS: 100.0000 mg | ORAL_CAPSULE | Freq: Two times a day (BID) | ORAL | 0 refills | Status: DC

## 2018-07-13 MED ORDER — SODIUM CHLORIDE 0.9 % IV SOLN
INTRAVENOUS | Status: AC
  Administered 2018-07-13: 13:00:00 via INTRAVENOUS

## 2018-07-13 MED ORDER — SODIUM CHLORIDE 0.9% FLUSH
10.0000 mL | INTRAVENOUS | Status: DC | PRN
  Administered 2018-07-14 (×2): 10 mL
  Filled 2018-07-13 (×2): qty 40

## 2018-07-13 MED ORDER — POLYETHYLENE GLYCOL 3350 17 G PO PACK: 17.0000 g | PACK | Freq: Every day | ORAL | 0 refills | Status: DC

## 2018-07-13 NOTE — Progress Notes (Addendum)
Social Work  Discharge Note  The overall goal for the admission was met for:   Discharge location: Yes - pt returning to his home with family providing 24/7 supervision  Length of Stay: Yes - 9 days (with discharge on 3/21)  Discharge activity level: Yes - supervision  Home/community participation: Yes  Services provided included: MD, RD, PT, OT, RN, TR, Pharmacy and SW  Financial Services: Medicare and Private Insurance: [place]  Follow-up services arranged: Home Health: PT, OT via [place], DME: 18x18 lightweight w/c, cushion via [place] and Patient/Family request agency HH: [facility], DME: NA  Comments (or additional information):    Contact info:  Pt's daughter, [name] @ [phone]            Daughter, Evon Slack, @ [phone]  Patient/Family verbalized understanding of follow-up arrangements: Yes  Individual responsible for coordination of the follow-up plan: pt  Confirmed correct DME delivered: Lennart Pall 07/13/2018    Virgene Tirone

## 2018-07-13 NOTE — Progress Notes (Signed)
Social Work Patient ID: [name], male   DOB: Oct 21, 1931, 85 y.o.   MRN: [id number]   Have reviewed team conference with pt and family.  All pleased with progress and agreeable with d/c 3/21 with supervision goals.  No further concerns.  [place], LCSW
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
## 2018-07-13 NOTE — Progress Notes (Signed)
Physical Therapy Discharge Summary  Patient Details  Name: [name] MRN: [id number] Date of Birth: 1931/09/08  Today's Date: 07/13/2018 PT Individual Time: 1335-1410 AND [id number] PT Individual Time Calculation (min): 35 min AND 60 min  Session 1:  Pt in w/c and agreeable to therapy, no c/o pain. Pt self-propelled w/c around unit w/ supervision using BUEs, >150' at a time. Practiced car transfer w/ supervision and ambulating in household environment. Required pt to negotiate and weave through cones w/ RW, performed w/ supervision 50' x2. Returned to room and practiced bed mobility at height of pt's bed at home and w/o rails, supervision for this as well. Pt requesting to remain in supine at end of session. Ended session in supine, all needs in reach.   Session 2:  Pt in supine and agreeable to therapy, denies pain. Requesting to toilet. Ambulated to/from toilet w/ supervision using RW. Supervision for pericare and LE garment management as well. Pt self-propelled w/c to/from therapy gym, modified independent, occasional cues for w/c parts management. Worked on dynamic standing balance tasks in gym while maintaining RLE TDWB and balancing w/ unilateral UE support on walker. Performed horseshoe toss and card matching game w/ supervision. Performed arm ergometer 5 min forward and 5 min backward to work on loosening up B shoulders and facilitating trunk rotation, pt guarded w/ all trunk movement 2/2 ongoing rib soreness at fracture sites. Returned to room via w/c, ended session in w/c w/ all needs in reach.   Patient has met 6 of 7 long term goals due to improved activity tolerance, improved balance, improved postural control, increased strength, increased range of motion, decreased pain and ability to compensate for deficits.  Patient to discharge at an ambulatory level Supervision for short household distances and modified independent w/c level for longer household and community distances    Patient's care partner (daughter and son-in-law) is independent to provide the necessary physical assistance at discharge. Pt daughter has been verbally educated on pt's WB precautions at this time and need for cues to "not put weight on R leg", as this is the only cue that encourages the patient to comply to TDWB. She verbalizes understanding that he will require 24/7 supervision for safety upon discharge.   Reasons goals not met: Pt continues to require supervision for dynamic standing balance tasks for safety.   Recommendation:  Patient will benefit from ongoing skilled PT services in home health setting to continue to advance safe functional mobility, address ongoing impairments in functional strength, functional balance, RLE ROM, and endurance, and minimize fall risk. Pt has been provided w/ HEP to maintain RLE strength and flexibility and demonstrates independence w/ performing exercises. See note on 07/12/18 for details of HEP.   Equipment: 18x18 w/c   Reasons for discharge: treatment goals met and discharge from hospital  Patient/family agrees with progress made and goals achieved: Yes  PT Discharge Precautions/Restrictions Precautions Precautions: Fall Restrictions Weight Bearing Restrictions: Yes RLE Weight Bearing: Touchdown weight bearing Vision/Perception  Perception Perception: Within Functional Limits Praxis Praxis: Intact  Cognition Overall Cognitive Status: Within Functional Limits for tasks assessed Arousal/Alertness: Awake/alert Orientation Level: Oriented X4 Attention: Sustained Sustained Attention: Appears intact Memory: Appears intact Awareness: Appears intact Problem Solving: Appears intact Safety/Judgment: Appears intact Sensation Sensation Light Touch: Appears Intact Hot/Cold: Appears Intact Proprioception: Appears Intact Stereognosis: Appears Intact Coordination Gross Motor Movements are Fluid and Coordinated: No Fine Motor Movements are Fluid and  Coordinated: Yes Coordination and Movement Description: Gross motor movements impaired 2/2 guarding  of RLE Motor  Motor Motor: Within Functional Limits Motor - Skilled Clinical Observations: generalized weakness   Mobility Bed Mobility Bed Mobility: Rolling Right;Rolling Left;Supine to Sit;Sit to Supine Rolling Right: Supervision/verbal cueing Rolling Left: Supervision/Verbal cueing Supine to Sit: Supervision/Verbal cueing Sit to Supine: Supervision/Verbal cueing Transfers Transfers: Sit to Stand;Stand Pivot Transfers;Stand to Sit Sit to Stand: Supervision/Verbal cueing Stand to Sit: Supervision/Verbal cueing Stand Pivot Transfers: Supervision/Verbal cueing Stand Pivot Transfer Details: Tactile cues for weight beaing Transfer (Assistive device): Rolling walker Locomotion  Gait Ambulation: Yes Gait Assistance: Supervision/Verbal cueing Gait Distance (Feet): 50 Feet Assistive device: Rolling walker Gait Assistance Details: Verbal cues for precautions/safety Gait Gait: Yes Gait Pattern: Impaired Gait Pattern: Step-to pattern;Antalgic Gait velocity: decreased High Level Ambulation High Level Ambulation: Side stepping Side Stepping: supervision w/ RW Stairs / Additional Locomotion Stairs: No Wheelchair Mobility Wheelchair Mobility: Yes Wheelchair Assistance: Independent with Camera operator: Both upper extremities Wheelchair Parts Management: Supervision/cueing Distance: 150'  Trunk/Postural Assessment  Cervical Assessment Cervical Assessment: Within Functional Limits Thoracic Assessment Thoracic Assessment: Within Functional Limits Lumbar Assessment Lumbar Assessment: Within Functional Limits Postural Control Postural Control: Within Functional Limits  Balance Balance Balance Assessed: Yes Static Sitting Balance Static Sitting - Balance Support: Feet supported Static Sitting - Level of Assistance: 7: Independent Dynamic Sitting  Balance Dynamic Sitting - Balance Support: During functional activity Dynamic Sitting - Level of Assistance: 7: Independent Static Standing Balance Static Standing - Balance Support: During functional activity Static Standing - Level of Assistance: 5: Stand by assistance Dynamic Standing Balance Dynamic Standing - Balance Support: During functional activity Dynamic Standing - Level of Assistance: 5: Stand by assistance Extremity Assessment  RLE Assessment RLE Assessment: Exceptions to [facility] General Strength Comments: globally 4/5, no pain LLE Assessment LLE Assessment: Within Functional Limits    Osei Anger K Reagann Dolce 07/13/2018, 2:13 PM

## 2018-07-13 NOTE — Progress Notes (Signed)
Occupational Therapy Discharge Summary  Patient Details  Name: [name] MRN: [id number] Date of Birth: Apr 22, 1932  Today's Date: 07/13/2018 OT Individual Time: 1430-1505 OT Individual Time Calculation (min): 35 min   Pt greeted semi-reclined in bed and agreeable to OT treatment session. Completed MMT and discharge information. Pt provided with home exercise program using level 2 orange theraband. Practiced each exercise with pt 1 set of 10 reps. Discussed dc plan, energy conservation, and safety modifications. Pt left semi-reclined in bed with call bell in reach, bed alarm on, and needs met.   Patient has met 10 of 10 long term goals due to improved activity tolerance, improved balance, postural control, ability to compensate for deficits and functional use of  RIGHT lower extremity.  Patient to discharge at overall Supervision level.  Patient's care partner is independent to provide the necessary physical assistance at discharge.    Reasons goals not met: n/a  Recommendation:  Patient will benefit from ongoing skilled OT services in home health setting to continue to advance functional skills in the area of BADL.  Equipment: wheelchair, RW  Reasons for discharge: treatment goals met and discharge from hospital  Patient/family agrees with progress made and goals achieved: Yes  OT Discharge Precautions/Restrictions  Precautions Precautions: Fall Restrictions Weight Bearing Restrictions: Yes RLE Weight Bearing: Touchdown weight bearing Pain Pain Assessment Pain Scale: 0-10 Pain Score: 0-No pain ADL ADL Eating: Independent Where Assessed-Eating: Wheelchair Grooming: Independent Where Assessed-Grooming: Wheelchair Upper Body Bathing: Supervision/safety Where Assessed-Upper Body Bathing: Wheelchair Lower Body Bathing: Supervision/safety Where Assessed-Lower Body Bathing: Wheelchair Upper Body Dressing: Supervision/safety Where Assessed-Upper Body Dressing:  Wheelchair Lower Body Dressing: Supervision/safety Where Assessed-Lower Body Dressing: Wheelchair Toileting: Supervision/safety Toilet Transfer: Close supervision Armed forces technical officer Method: Counselling psychologist: Geophysical data processor: Close supervision Perception  Perception: Within Functional Limits Praxis Praxis: Intact Cognition Overall Cognitive Status: Within Functional Limits for tasks assessed Arousal/Alertness: Awake/alert Orientation Level: Oriented X4 Attention: Sustained Sustained Attention: Appears intact Memory: Appears intact Awareness: Appears intact Problem Solving: Appears intact Safety/Judgment: Appears intact Sensation Sensation Light Touch: Appears Intact Hot/Cold: Appears Intact Proprioception: Appears Intact Stereognosis: Appears Intact Coordination Gross Motor Movements are Fluid and Coordinated: No Fine Motor Movements are Fluid and Coordinated: Yes Coordination and Movement Description: Gross motor movements impaired 2/2 guarding of RLE Motor  Motor Motor: Within Functional Limits Motor - Skilled Clinical Observations: generalized weakness  Mobility  Transfers Sit to Stand: Supervision/Verbal cueing Stand to Sit: Supervision/Verbal cueing  Trunk/Postural Assessment  Cervical Assessment Cervical Assessment: Within Functional Limits Thoracic Assessment Thoracic Assessment: Within Functional Limits Lumbar Assessment Lumbar Assessment: Within Functional Limits Postural Control Postural Control: Within Functional Limits  Balance Balance Balance Assessed: Yes Static Sitting Balance Static Sitting - Balance Support: Feet supported Static Sitting - Level of Assistance: 7: Independent Dynamic Sitting Balance Dynamic Sitting - Balance Support: During functional activity Dynamic Sitting - Level of Assistance: 7: Independent Static Standing Balance Static Standing - Balance Support: During functional  activity Static Standing - Level of Assistance: 5: Stand by assistance Dynamic Standing Balance Dynamic Standing - Balance Support: During functional activity Dynamic Standing - Level of Assistance: 5: Stand by assistance Extremity/Trunk Assessment RUE Assessment RUE Assessment: Within Functional Limits LUE Assessment LUE Assessment: Within Functional Limits   [name] [name] 07/13/2018, 12:31 PM

## 2018-07-13 NOTE — Plan of Care (Signed)
  Problem: Consults Goal: RH GENERAL PATIENT EDUCATION Description See Patient Education module for education specifics. Outcome: Progressing Goal: Skin Care Protocol Initiated - if Braden Score 18 or less Description If consults are not indicated, leave blank or document N/A Outcome: Progressing Goal: Diabetes Guidelines if Diabetic/Glucose > 140 Description If diabetic or lab glucose is > 140 mg/dl - Initiate Diabetes/Hyperglycemia Guidelines & Document Interventions  Outcome: Progressing   Problem: RH BOWEL ELIMINATION Goal: RH STG MANAGE BOWEL WITH ASSISTANCE Description STG Manage Bowel with mod I Assistance.  Outcome: Progressing Goal: RH STG MANAGE BOWEL W/MEDICATION W/ASSISTANCE Description STG Manage Bowel with Medication with mod I Assistance.  Outcome: Progressing   Problem: RH BLADDER ELIMINATION Goal: RH STG MANAGE BLADDER WITH ASSISTANCE Description STG Manage Bladder With mod I Assistance  Outcome: Progressing   Problem: RH SKIN INTEGRITY Goal: RH STG SKIN FREE OF INFECTION/BREAKDOWN Description Patients skin will remain free from further breakdown or infection with min assist.  Outcome: Progressing Goal: RH STG MAINTAIN SKIN INTEGRITY WITH ASSISTANCE Description STG Maintain Skin Integrity With min Assistance.  Outcome: Progressing   Problem: RH SAFETY Goal: RH STG ADHERE TO SAFETY PRECAUTIONS W/ASSISTANCE/DEVICE Description STG Adhere to Safety Precautions With supervision Assistance/Device.  Outcome: Progressing   Problem: RH PAIN MANAGEMENT Goal: RH STG PAIN MANAGED AT OR BELOW PT'S PAIN GOAL Description < 4  Outcome: Progressing

## 2018-07-13 NOTE — Progress Notes (Addendum)
[place] PHYSICAL MEDICINE & REHABILITATION PROGRESS NOTE  Subjective/Complaints: Patient seen laying in bed this morning.  He states he slept well overnight.  He remembers a conversation on 2 days ago.  He is looking forward to discharge tomorrow.  ROS: Denies CP, SOB, N/V/D  Objective: Vital Signs: Blood pressure 114/65, pulse (!) 59, temperature 97.6 F (36.4 C), resp. rate 19, height 5\' 8"  (1.727 m), weight 70 kg, SpO2 99 %. No results found. No results for input(s): WBC, HGB, HCT, PLT in the last 72 hours. Recent Labs    07/11/18 0504  NA 135  K 3.9  CL 105  CO2 25  GLUCOSE 168*  BUN 44*  CREATININE 1.29*  CALCIUM 9.0    Physical Exam: BP 114/65 (BP Location: Left Arm)   Pulse (!) 59   Temp 97.6 F (36.4 C)   Resp 19   Ht 5\' 8"  (1.727 m)   Wt 70 kg   SpO2 99%   BMI 23.46 kg/m  Constitutional: No distress . Vital signs reviewed. HENT: Normocephalic.  Atraumatic. Eyes: EOMI. No discharge. Cardiovascular: RRR. No JVD. Respiratory: CTA Bilaterally. Normal effort. GI: BS +. Non-distended. Musc: No edema or tenderness in extremities. Neurologic: Alert and oriented. Motor: Right lower extremity: Hip flexion 4/5, knee extension 4/5, ankle dorsiflexion 4/5, improving Left lower extremity: Hip flexion 4/5, knee extension 4/5, ankle dorsiflexion 4+/5, improving Skin: Frail skin  Assessment/Plan: 1. Functional deficits secondary to polytrauma which require 3+ hours per day of interdisciplinary therapy in a comprehensive inpatient rehab setting.  Physiatrist is providing close team supervision and 24 hour management of active medical problems listed below.  Physiatrist and rehab team continue to assess barriers to discharge/monitor patient progress toward functional and medical goals  Care Tool:  Bathing    Body parts bathed by patient: Right arm, Left arm, Chest, Abdomen, Front perineal area, Right upper leg, Left upper leg, Face, Buttocks, Right lower leg,  Left lower leg   Body parts bathed by helper: Buttocks, Right lower leg, Left lower leg     Bathing assist Assist Level: Contact Guard/Touching assist     Upper Body Dressing/Undressing Upper body dressing   What is the patient wearing?: Pull over shirt    Upper body assist Assist Level: Supervision/Verbal cueing    Lower Body Dressing/Undressing Lower body dressing      What is the patient wearing?: Pants     Lower body assist Assist for lower body dressing: Contact Guard/Touching assist     Toileting Toileting    Toileting assist Assist for toileting: Minimal Assistance - Patient > 75% Assistive Device Comment: (have urinal set up beside the bed)   Transfers Chair/bed transfer  Transfers assist  Chair/bed transfer activity did not occur: Safety/medical concerns  Chair/bed transfer assist level: Supervision/Verbal cueing     Locomotion Ambulation   Ambulation assist   Ambulation activity did not occur: Safety/medical concerns  Assist level: Supervision/Verbal cueing Assistive device: Walker-rolling Max distance: 30'   Walk 10 feet activity   Assist  Walk 10 feet activity did not occur: Safety/medical concerns  Assist level: Minimal Assistance - Patient > 75% Assistive device: Walker-rolling   Walk 50 feet activity   Assist Walk 50 feet with 2 turns activity did not occur: Safety/medical concerns  Assist level: Minimal Assistance - Patient > 75%      Walk 150 feet activity   Assist Walk 150 feet activity did not occur: Safety/medical concerns  Walk 10 feet on uneven surface  activity   Assist Walk 10 feet on uneven surfaces activity did not occur: Safety/medical concerns         Wheelchair     Assist   Type of Wheelchair: Manual    Wheelchair assist level: Supervision/Verbal cueing Max wheelchair distance: 150    Wheelchair 50 feet with 2 turns activity    Assist        Assist Level:  Supervision/Verbal cueing   Wheelchair 150 feet activity     Assist     Assist Level: Supervision/Verbal cueing      Medical Problem List and Plan: 1.  Functional deficits secondary to Metaline for d/c tomorrow  Will see patient for transitional care management in 1-2 weeks post-discharge 2.  Chronic A fib/Antithrombotics: -DVT/anticoagulation:  Pharmaceutical: Xarelto             -antiplatelet therapy: N/A 3. Pain Management: Oxycodone prn 4. Mood: LCSW to follow for evaluation and support. Mood stable on Zoloft daily             -antipsychotic agents: N/A 5. Neuropsych: This patient is capable of making decisions on his own behalf. 6. Skin/Wound Care: Routine pressure relief measures  -change right forearm to foam dressing, spoke with RN 7. Fluids/Electrolytes/Nutrition: Monitor I/O. Encourage fluid intake.  8. H/o syncope/Orthostatic hypotension: Needs to take time with transitional movements. Continue TEDs 9. T2DM: BS are poorly controlled. Was on Tujeo U-300 PTA. Will continue to monitor BS ac/hs with SSI and titrate up to home dose as indicated.   Increased lantus to 24u qhs on 3/14, increased to 29 on 3/17, increased lantus to 33 units on 3/19  Labile on 3/20, will need ambulatory monitoring with further adjustments as necessary 10. Metastatic prostate cancer/renal stents: To follow up with Dr. Rose Phi for stent exchange after discharge. Monitor for recurrent hematuria.  11.CKD III/Anemia of chronic disease: Encourage fluids.   Creatinine 1.29 on 3/18  Labs ordered for tomorrow  Fluid bolus ordered for today  Encourage fluids  Hemoglobin 10.3 on 3/16  Continue to monitor 12. Right iliac fracture: TDWB/Follow up with Dr. Percell Miller post discharge. 13. Right rib Fx/trace PTX: Encourage pulmonary toilet 14. Enterococcus UTI: Amoxicillin X 7 days (through today) as stents in place 15. Right ankle pain: Likely right ankle OA with superimposed  strain  X-ray revealed soft tissue swelling and OA  Voltaren gel    Will consider splint if necessary  Pt states pain is improving 16.  Hypoalbuminemia  Supplement initiated on 3/13 17.  Thrombocytopenia: Resolved  Platelets 164 on 3/16  Continue to monitor  LOS: 8 days A FACE TO FACE EVALUATION WAS PERFORMED  Joanie Duprey [name] 07/13/2018, 8:47 AM

## 2018-07-13 NOTE — Progress Notes (Signed)
Occupational Therapy Session Note  Patient Details  Name: [name] MRN: [id number] Date of Birth: Oct 19, 1931  Today's Date: 07/13/2018 OT Individual Time: 1100-1155 OT Individual Time Calculation (min): 55 min    Short Term Goals: Week 1:  OT Short Term Goal 1 (Week 1): Pt will complete stand pivot transfer to 3:1 with min assist using the RW. OT Short Term Goal 2 (Week 1): Pt will complete LB bathing and dressing with min guard assist.  OT Short Term Goal 3 (Week 1): Pt will maintain TDWBing through the RLE during transfers and LB selfcare with no more than min instructional cueing OT Short Term Goal 4 (Week 1): Pt will perform walk-in shower with min assist using the RW.  Skilled Therapeutic Interventions/Progress Updates:    Pt greeted seated in wc and agreeable to OT treatment session. Pt reports need to go to the bathroom and ambulated w/ RW and CGA to bathroom. Min verbal cues for TDWB R LE. Pt able to manage clothing and complete toileting with supervision after voiding bladder. Pt practiced stand-pivot transfers on/off commode with supervision. B UE strength/endurance with wc propulsion to and from therapy gym. Reviewed wc components including locking/unlcoking breaks and placing leg rests. Pt then completed UB there-ex 3 sets of 10 reps, bicep curls, chest press, and straight arm raises. Pt returned to room and left seated in wc with chair alarm on and needs met.   Therapy Documentation Precautions:  Precautions Precautions: Fall Restrictions Weight Bearing Restrictions: Yes RLE Weight Bearing: Touchdown weight bearing Pain: Pain Assessment Pain Scale: 0-10 Pain Score: 0-No pain  Therapy/Group: Individual Therapy  Valma Cava 07/13/2018, 12:12 PM
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
## 2018-07-13 NOTE — Discharge Instructions (Signed)
Inpatient Rehab Discharge Instructions  [name] Discharge date and time: 07/14/18    Activities/Precautions/ Functional Status: Activity: no lifting, driving, or strenuous exercise till cleared by MD Diet: cardiac diet and diabetic diet Wound Care: none needed    Functional status:  ___ No restrictions     ___ Walk up steps independently _X__ 24/7 supervision/assistance   ___ Walk up steps with assistance ___ Intermittent supervision/assistance  ___ Bathe/dress independently ___ Walk with walker     _X__ Bathe/dress with assistance ___ Walk Independently    ___ Shower independently ___ Walk with assistance    ___ Shower with assistance _X__ No alcohol     ___ Return to work/school ________     COMMUNITY REFERRALS UPON DISCHARGE:    Home Health:   PT     OT                       Agency:  [facility]   Phone: [phone]   Medical Equipment/Items Ordered:  Wheelchair, cushion                                                      Agency/Supplier:  [facility] @ [phone]      Special Instructions: 1. Limit weight to toe touch on right leg. Use walker 2. Family to provide stand by assist with all activity. 3. Needs to encourage fluid intake.  4. Note decrease in insulin dose--monitor blood sugars before meals and at bedtime. Follow up with Dr. Shelia Media for further adjustment of insulin.    My questions have been answered and I understand these instructions. I will adhere to these goals and the provided educational materials after my discharge from the hospital.  Patient/Caregiver Signature _______________________________ Date __________  Clinician Signature _______________________________________ Date __________  Please bring this form and your medication list with you to all your follow-up doctor's appointments. Information on my medicine - XARELTO (Rivaroxaban)  This medication education was reviewed with me or my healthcare representative  as part of my discharge preparation.  The pharmacist that spoke with me during my hospital stay was:  Onnie Boer, RPH-CPP  Why was Xarelto prescribed for you? Xarelto was prescribed for you to reduce the risk of a blood clot forming that can cause a stroke if you have a medical condition called atrial fibrillation (a type of irregular heartbeat).  What do you need to know about xarelto ? Take your Xarelto ONCE DAILY at the same time every day with your evening meal. If you have difficulty swallowing the tablet whole, you may crush it and mix in applesauce just prior to taking your dose.  Take Xarelto exactly as prescribed by your doctor and DO NOT stop taking Xarelto without talking to the doctor who prescribed the medication.  Stopping without other stroke prevention medication to take the place of Xarelto may increase your risk of developing a clot that causes a stroke.  Refill your prescription before you run out.  After discharge, you should have regular check-up appointments with your healthcare provider that is prescribing your Xarelto.  In the future your dose may need to be changed if your kidney function or weight changes by a significant amount.  What do you do if you miss a dose? If you are taking Xarelto ONCE  DAILY and you miss a dose, take it as soon as you remember on the same day then continue your regularly scheduled once daily regimen the next day. Do not take two doses of Xarelto at the same time or on the same day.   Important Safety Information A possible side effect of Xarelto is bleeding. You should call your healthcare provider right away if you experience any of the following: ? Bleeding from an injury or your nose that does not stop. ? Unusual colored urine (red or dark brown) or unusual colored stools (red or black). ? Unusual bruising for unknown reasons. ? A serious fall or if you hit your head (even if there is no bleeding).  Some medicines may interact  with Xarelto and might increase your risk of bleeding while on Xarelto. To help avoid this, consult your healthcare provider or pharmacist prior to using any new prescription or non-prescription medications, including herbals, vitamins, non-steroidal anti-inflammatory drugs (NSAIDs) and supplements.  This website has more information on Xarelto: [email or web address]

## 2018-07-14 DIAGNOSIS — N179 Acute kidney failure, unspecified: Secondary | ICD-10-CM

## 2018-07-14 DIAGNOSIS — N189 Chronic kidney disease, unspecified: Secondary | ICD-10-CM

## 2018-07-14 LAB — BASIC METABOLIC PANEL
Anion gap: 9 (ref 5–15)
BUN: 37 mg/dL — ABNORMAL HIGH (ref 8–23)
CO2: 24 mmol/L (ref 22–32)
Calcium: 9 mg/dL (ref 8.9–10.3)
Chloride: 104 mmol/L (ref 98–111)
Creatinine, Ser: 1.31 mg/dL — ABNORMAL HIGH (ref 0.61–1.24)
GFR calc Af Amer: 58 mL/min — ABNORMAL LOW (ref >60)
GFR, EST NON AFRICAN AMERICAN: 50 mL/min — AB (ref >60)
GLUCOSE: 138 mg/dL — AB (ref 70–99)
Potassium: 3.9 mmol/L (ref 3.5–5.1)
Sodium: 137 mmol/L (ref 135–145)

## 2018-07-14 LAB — GLUCOSE, CAPILLARY: Glucose-Capillary: 136 mg/dL — ABNORMAL HIGH (ref 70–99)

## 2018-07-14 MED ORDER — HEPARIN SOD (PORK) LOCK FLUSH 100 UNIT/ML IV SOLN
500.0000 [IU] | INTRAVENOUS | Status: AC | PRN
  Administered 2018-07-14: 500 [IU]

## 2018-07-14 NOTE — Progress Notes (Signed)
[name] is a 85 y.o. male December 16, 1931 [id number]  Subjective: No new complaints. No new problems. Slept well. Feeling OK.  Planning to go home today  Objective: Vital signs in last 24 hours: Temp:  [98.1 F (36.7 C)-98.5 F (36.9 C)] 98.5 F (36.9 C) (03/21 0428) Pulse Rate:  [59-64] 61 (03/21 0428) Resp:  [16-18] 16 (03/21 0428) BP: (93-107)/(49-68) 107/68 (03/21 0428) SpO2:  [95 %-98 %] 95 % (03/21 0428) Weight change:  Last BM Date: 07/11/18  Intake/Output from previous day: 03/20 0701 - 03/21 0700 In: 2205.8 [P.O.:1200; I.V.:1005.8] Out: 1225 [Urine:1225] Last cbgs: CBG (last 3)  Recent Labs    07/13/18 1658 07/13/18 2107 07/14/18 0647  GLUCAP 127* 194* 136*     Physical Exam General: No apparent distress   HEENT: not dry Lungs: Normal effort. Lungs clear to auscultation, no crackles or wheezes. Cardiovascular: Regular rate and rhythm, no edema Abdomen: S/NT/ND; BS(+) Musculoskeletal:  unchanged Neurological: No new neurological deficits Wounds: N/A    Skin: clear  Aging changes Mental state: Alert, oriented, cooperative    Lab Results: BMET    Component Value Date/Time   NA 137 07/14/2018 0418   NA 139 03/21/2017 1351   K 3.9 07/14/2018 0418   K 3.6 03/21/2017 1351   CL 104 07/14/2018 0418   CO2 24 07/14/2018 0418   CO2 23 03/21/2017 1351   GLUCOSE 138 (H) 07/14/2018 0418   GLUCOSE 201 (H) 03/21/2017 1351   BUN 37 (H) 07/14/2018 0418   BUN 25.1 03/21/2017 1351   CREATININE 1.31 (H) 07/14/2018 0418   CREATININE 1.6 (H) 03/21/2017 1351   CALCIUM 9.0 07/14/2018 0418   CALCIUM 9.3 03/21/2017 1351   GFRNONAA 50 (L) 07/14/2018 0418   GFRAA 58 (L) 07/14/2018 0418   CBC    Component Value Date/Time   WBC 6.0 07/09/2018 0609   RBC 3.80 (L) 07/09/2018 0609   HGB 10.3 (L) 07/09/2018 0609   HGB 9.6 (L) 03/21/2017 1351   HCT 31.9 (L) 07/09/2018 0609   HCT 29.7 (L) 03/21/2017 1351   PLT 164 07/09/2018 0609   PLT 150 03/21/2017 1351   MCV  83.9 07/09/2018 0609   MCV 84.3 03/21/2017 1351   MCH 27.1 07/09/2018 0609   MCHC 32.3 07/09/2018 0609   RDW 15.3 07/09/2018 0609   RDW 14.8 (H) 03/21/2017 1351   LYMPHSABS 0.2 (L) 07/06/2018 0619   LYMPHSABS 0.3 (L) 03/21/2017 1351   MONOABS 0.5 07/06/2018 0619   MONOABS 0.4 03/21/2017 1351   EOSABS 0.2 07/06/2018 0619   EOSABS 0.2 03/21/2017 1351   BASOSABS 0.0 07/06/2018 0619   BASOSABS 0.0 03/21/2017 1351    Studies/Results: No results found.  Medications: I have reviewed the patient's current medications.  Assessment/Plan:  1.  Status post polytrauma.  CIR completed.  Discharge home today 2.  Chronic A. fib on Xarelto.  Rate controlled 3.  DVT prophylaxis with Xarelto 4.  Pain control with oxycodone as needed 5.  Orthostatic hypotension-continue with compression socks 6.  Type 2 diabetes.  On insulin (Lantus).  Monitor CBGs at home 7.  Metastatic prostate cancer.  Renal stents.  Follow-up with Dr. Araceli Bouche as an outpatient 8.  CKD 3. Continue to monitor kidney function  9.  Anemia monitor hemoglobins 10.  Thrombocytopenia.  Resolved 11.  Hypoalbuminemia.  No nutritional supplements.   Length of stay, days: 9  Walker Kehr , MD 07/14/2018, 9:18 AM

## 2018-07-14 NOTE — Progress Notes (Signed)
Patient's discharge instruction given to patient and family. All questions answered. Patient wheeled down by nurse tech via wheelchair with all personal belongings. Nicholes Rough, LPN

## 2018-07-16 DIAGNOSIS — E119 Type 2 diabetes mellitus without complications: Secondary | ICD-10-CM | POA: Diagnosis not present

## 2018-07-16 DIAGNOSIS — S2231XD Fracture of one rib, right side, subsequent encounter for fracture with routine healing: Secondary | ICD-10-CM | POA: Diagnosis not present

## 2018-07-16 DIAGNOSIS — S32301D Unspecified fracture of right ilium, subsequent encounter for fracture with routine healing: Secondary | ICD-10-CM | POA: Diagnosis not present

## 2018-07-16 DIAGNOSIS — Z794 Long term (current) use of insulin: Secondary | ICD-10-CM | POA: Diagnosis not present

## 2018-07-16 DIAGNOSIS — Z7901 Long term (current) use of anticoagulants: Secondary | ICD-10-CM | POA: Diagnosis not present

## 2018-07-16 DIAGNOSIS — Z87440 Personal history of urinary (tract) infections: Secondary | ICD-10-CM | POA: Diagnosis not present

## 2018-07-17 ENCOUNTER — Encounter: Payer: Medicare Other | Admitting: Registered Nurse

## 2018-07-18 ENCOUNTER — Encounter: Payer: Medicare Other | Attending: Registered Nurse | Admitting: Registered Nurse

## 2018-07-18 ENCOUNTER — Encounter: Payer: Self-pay | Admitting: Registered Nurse

## 2018-07-18 DIAGNOSIS — I10 Essential (primary) hypertension: Secondary | ICD-10-CM | POA: Diagnosis not present

## 2018-07-18 DIAGNOSIS — E119 Type 2 diabetes mellitus without complications: Secondary | ICD-10-CM

## 2018-07-18 DIAGNOSIS — N183 Chronic kidney disease, stage 3 unspecified: Secondary | ICD-10-CM

## 2018-07-18 DIAGNOSIS — T1490XA Injury, unspecified, initial encounter: Secondary | ICD-10-CM

## 2018-07-18 DIAGNOSIS — M25571 Pain in right ankle and joints of right foot: Secondary | ICD-10-CM | POA: Diagnosis not present

## 2018-07-18 DIAGNOSIS — Z960 Presence of urogenital implants: Secondary | ICD-10-CM

## 2018-07-18 DIAGNOSIS — S32810D Multiple fractures of pelvis with stable disruption of pelvic ring, subsequent encounter for fracture with routine healing: Secondary | ICD-10-CM | POA: Diagnosis not present

## 2018-07-18 NOTE — Progress Notes (Signed)
Virtual Care call Virtual Call Questions Answered by [name] Daughter  Patient name: [name]  DOB: January 20, 1932 1. Are you/is patient experiencing any problems since coming home? No a. Are there any questions regarding any aspect of care? No 2. Are there any questions regarding medications administration/dosing? [name] reports there was a problem with discharge medications his Toujeo and Metformin and she resumed his home medications. Discharge summary was reviewed, she verbalizes understanding.  a. Are meds being taken as prescribed? Yes b. "Patient should review meds with caller to confirm" Medication List Reviewed. 3. Have there been any falls? No 4. Has Home Health been to the house and/or have they contacted you? Yes, [facility]. a. If not, have you tried to contact them? NA b. Can we help you contact them? NA 5. Are bowels and bladder emptying properly? Yes a. Are there any unexpected incontinence issues? No b. If applicable, is patient following bowel/bladder programs? NA 6. Any fevers, problems with breathing, unexpected pain? No 7. Are there any skin problems or new areas of breakdown? No 8. Has the patient/family member arranged specialty MD follow up (ie cardiology/neurology/renal/surgical/etc.)?  HFU appointments have been scheduled. a. Can we help arrange? NA 9. Does the patient need any other services or support that we can help arrange? No 10. Are caregivers following through as expected in assisting the patient? Yes 11. Has the patient quit smoking, drinking alcohol, or using drugs as recommended? Ms. White states [name] doesn't smoke, drink alcohol or use illicit drugs.   Ms. Dema Severin daughter of [name] doesn't want to make a hospital F/U appointment at this time, due to the COVID-19 virus. She was instructed to call office to schedule a HFU appointment, she verbalizes understanding. Also she will call his PCP to write [place] orders.

## 2018-07-19 ENCOUNTER — Ambulatory Visit: Admit: 2018-07-19 | Payer: Medicare Other | Admitting: Urology

## 2018-07-19 DIAGNOSIS — Z794 Long term (current) use of insulin: Secondary | ICD-10-CM | POA: Diagnosis not present

## 2018-07-19 DIAGNOSIS — S2231XD Fracture of one rib, right side, subsequent encounter for fracture with routine healing: Secondary | ICD-10-CM | POA: Diagnosis not present

## 2018-07-19 DIAGNOSIS — Z87440 Personal history of urinary (tract) infections: Secondary | ICD-10-CM | POA: Diagnosis not present

## 2018-07-19 DIAGNOSIS — E119 Type 2 diabetes mellitus without complications: Secondary | ICD-10-CM | POA: Diagnosis not present

## 2018-07-19 DIAGNOSIS — S32301D Unspecified fracture of right ilium, subsequent encounter for fracture with routine healing: Secondary | ICD-10-CM | POA: Diagnosis not present

## 2018-07-19 SURGERY — CYSTOSCOPY, WITH STENT INSERTION
Anesthesia: General | Laterality: Bilateral

## 2018-07-23 DIAGNOSIS — S2231XD Fracture of one rib, right side, subsequent encounter for fracture with routine healing: Secondary | ICD-10-CM | POA: Diagnosis not present

## 2018-07-23 DIAGNOSIS — E119 Type 2 diabetes mellitus without complications: Secondary | ICD-10-CM | POA: Diagnosis not present

## 2018-07-23 DIAGNOSIS — S32301D Unspecified fracture of right ilium, subsequent encounter for fracture with routine healing: Secondary | ICD-10-CM | POA: Diagnosis not present

## 2018-07-24 DIAGNOSIS — S2231XD Fracture of one rib, right side, subsequent encounter for fracture with routine healing: Secondary | ICD-10-CM | POA: Diagnosis not present

## 2018-07-24 DIAGNOSIS — S32301D Unspecified fracture of right ilium, subsequent encounter for fracture with routine healing: Secondary | ICD-10-CM | POA: Diagnosis not present

## 2018-07-24 DIAGNOSIS — Z87440 Personal history of urinary (tract) infections: Secondary | ICD-10-CM | POA: Diagnosis not present

## 2018-07-24 DIAGNOSIS — Z794 Long term (current) use of insulin: Secondary | ICD-10-CM | POA: Diagnosis not present

## 2018-07-24 DIAGNOSIS — E119 Type 2 diabetes mellitus without complications: Secondary | ICD-10-CM | POA: Diagnosis not present

## 2018-07-25 NOTE — Progress Notes (Deleted)
I called [name] and left her a voicemail letting her know that we are canceling ACEs treatment 04/09.  I am leaving the June treatment in place for now.

## 2018-07-26 DIAGNOSIS — S32301D Unspecified fracture of right ilium, subsequent encounter for fracture with routine healing: Secondary | ICD-10-CM | POA: Diagnosis not present

## 2018-07-26 DIAGNOSIS — Z87440 Personal history of urinary (tract) infections: Secondary | ICD-10-CM | POA: Diagnosis not present

## 2018-07-26 DIAGNOSIS — E119 Type 2 diabetes mellitus without complications: Secondary | ICD-10-CM | POA: Diagnosis not present

## 2018-07-26 DIAGNOSIS — Z794 Long term (current) use of insulin: Secondary | ICD-10-CM | POA: Diagnosis not present

## 2018-07-26 DIAGNOSIS — S2231XD Fracture of one rib, right side, subsequent encounter for fracture with routine healing: Secondary | ICD-10-CM | POA: Diagnosis not present

## 2018-07-27 ENCOUNTER — Telehealth: Payer: Self-pay | Admitting: Oncology

## 2018-07-27 NOTE — Telephone Encounter (Signed)
R/s apt 4/1 sch message - left message for patient and sent letter in the mail .

## 2018-07-31 DIAGNOSIS — S2231XD Fracture of one rib, right side, subsequent encounter for fracture with routine healing: Secondary | ICD-10-CM | POA: Diagnosis not present

## 2018-07-31 DIAGNOSIS — Z794 Long term (current) use of insulin: Secondary | ICD-10-CM | POA: Diagnosis not present

## 2018-07-31 DIAGNOSIS — E119 Type 2 diabetes mellitus without complications: Secondary | ICD-10-CM | POA: Diagnosis not present

## 2018-07-31 DIAGNOSIS — S32301D Unspecified fracture of right ilium, subsequent encounter for fracture with routine healing: Secondary | ICD-10-CM | POA: Diagnosis not present

## 2018-07-31 DIAGNOSIS — Z87440 Personal history of urinary (tract) infections: Secondary | ICD-10-CM | POA: Diagnosis not present

## 2018-08-02 ENCOUNTER — Ambulatory Visit: Payer: Medicare Other

## 2018-08-02 ENCOUNTER — Ambulatory Visit: Payer: Medicare Other | Admitting: Oncology

## 2018-08-02 ENCOUNTER — Other Ambulatory Visit: Payer: Medicare Other

## 2018-08-02 DIAGNOSIS — E119 Type 2 diabetes mellitus without complications: Secondary | ICD-10-CM | POA: Diagnosis not present

## 2018-08-02 DIAGNOSIS — Z87440 Personal history of urinary (tract) infections: Secondary | ICD-10-CM | POA: Diagnosis not present

## 2018-08-02 DIAGNOSIS — S32301D Unspecified fracture of right ilium, subsequent encounter for fracture with routine healing: Secondary | ICD-10-CM | POA: Diagnosis not present

## 2018-08-02 DIAGNOSIS — S2231XD Fracture of one rib, right side, subsequent encounter for fracture with routine healing: Secondary | ICD-10-CM | POA: Diagnosis not present

## 2018-08-02 DIAGNOSIS — Z794 Long term (current) use of insulin: Secondary | ICD-10-CM | POA: Diagnosis not present

## 2018-08-06 ENCOUNTER — Other Ambulatory Visit: Payer: Medicare Other

## 2018-08-06 ENCOUNTER — Ambulatory Visit: Payer: Medicare Other | Admitting: Oncology

## 2018-08-06 ENCOUNTER — Ambulatory Visit: Payer: Medicare Other

## 2018-08-08 DIAGNOSIS — S32301D Unspecified fracture of right ilium, subsequent encounter for fracture with routine healing: Secondary | ICD-10-CM | POA: Diagnosis not present

## 2018-08-08 DIAGNOSIS — Z87440 Personal history of urinary (tract) infections: Secondary | ICD-10-CM | POA: Diagnosis not present

## 2018-08-08 DIAGNOSIS — Z794 Long term (current) use of insulin: Secondary | ICD-10-CM | POA: Diagnosis not present

## 2018-08-08 DIAGNOSIS — S2231XD Fracture of one rib, right side, subsequent encounter for fracture with routine healing: Secondary | ICD-10-CM | POA: Diagnosis not present

## 2018-08-08 DIAGNOSIS — E119 Type 2 diabetes mellitus without complications: Secondary | ICD-10-CM | POA: Diagnosis not present

## 2018-08-10 ENCOUNTER — Ambulatory Visit: Payer: Medicare Other | Admitting: Oncology

## 2018-08-10 ENCOUNTER — Other Ambulatory Visit: Payer: Medicare Other

## 2018-08-10 ENCOUNTER — Ambulatory Visit: Payer: Medicare Other

## 2018-08-15 ENCOUNTER — Inpatient Hospital Stay: Payer: BLUE CROSS/BLUE SHIELD | Admitting: Registered Nurse

## 2018-08-15 DIAGNOSIS — S2231XD Fracture of one rib, right side, subsequent encounter for fracture with routine healing: Secondary | ICD-10-CM | POA: Diagnosis not present

## 2018-08-15 DIAGNOSIS — Z87440 Personal history of urinary (tract) infections: Secondary | ICD-10-CM | POA: Diagnosis not present

## 2018-08-15 DIAGNOSIS — S32301D Unspecified fracture of right ilium, subsequent encounter for fracture with routine healing: Secondary | ICD-10-CM | POA: Diagnosis not present

## 2018-08-15 DIAGNOSIS — E119 Type 2 diabetes mellitus without complications: Secondary | ICD-10-CM | POA: Diagnosis not present

## 2018-08-15 DIAGNOSIS — Z7901 Long term (current) use of anticoagulants: Secondary | ICD-10-CM | POA: Diagnosis not present

## 2018-08-15 DIAGNOSIS — Z794 Long term (current) use of insulin: Secondary | ICD-10-CM | POA: Diagnosis not present

## 2018-09-05 ENCOUNTER — Other Ambulatory Visit: Payer: Self-pay | Admitting: Urology

## 2018-09-06 ENCOUNTER — Other Ambulatory Visit: Payer: Self-pay | Admitting: Urology

## 2018-09-10 NOTE — Patient Instructions (Addendum)
LENELL LAMA  09/10/2018   YOU ARE REQUIRED TO BE TESTED FOR COVID-19 PRIOR TO YOUR SURGERY . YOUR TEST MUST BE COMPLETED ON Monday Sep 17, 2018. TESTING IS LOCATED AT [place] ENTRANCE FROM 9:00AM - 3:00PM. FAILURE TO COMPLETE TESTING MAY RESULT IN CANCELLATION OF YOUR SURGERY.   _____________________________________________________________________    Your procedure is scheduled on: 09-20-2018   Report to [facility] Main  Entrance     Report to admitting at 10:30AM      Call this number if you have problems the morning of surgery [phone]   Remember: Do not eat food or drink liquids :After Midnight. BRUSH YOUR TEETH MORNING OF SURGERY AND RINSE YOUR MOUTH OUT, NO CHEWING GUM CANDY OR MINTS.   PLEASE BRING CPAP MASK AND  TUBING ONLY. DEVICE WILL BE PROVIDED!     Take these medicines the morning of surgery with A SIP OF WATER: allopurinol, zyrtec, gabapentin, Sertraline   DO NOT TAKE ANY DIABETES MEDICATION THE DAY OF SURGERY! PLEASE CHECK YOUR BLOOD SUGAR THE DAY OF SURGERY. REPORT TO YOUR NURSE ON ARRIVAL.  IF YOUR BLOOD SUGAR IS LESS THAN 70 THE MORNING SURGERY WHEN YOU CHECK IT AT HOME, YOU NEED TO CALL THE [phone] FOR FURTHER INSTRUCTIONS!                                  You may not have any metal on your body including hair pins and              piercings  Do not wear jewelry, make-up, lotions, powders or perfumes, deodorant                     Men may shave face and neck.   Do not bring valuables to the hospital. [place].  Contacts, dentures or bridgework may not be worn into surgery.      Patients discharged the day of surgery will not be allowed to drive home. IF YOU ARE HAVING SURGERY AND GOING HOME THE SAME DAY, YOU MUST HAVE AN ADULT TO DRIVE YOU HOME AND BE WITH YOU FOR 24 HOURS. YOU MAY GO HOME BY TAXI OR UBER OR ORTHERWISE, BUT  AN ADULT MUST ACCOMPANY YOU HOME AND STAY WITH YOU FOR 24 HOURS.  Name and phone number of your driver:  Special Instructions: N/A              Please read over the following fact sheets you were given: _____________________________________________________________________             [facility] - Preparing for Surgery Before surgery, you can play an important role.  Because skin is not sterile, your skin needs to be as free of germs as possible.  You can reduce the number of germs on your skin by washing with CHG (chlorahexidine gluconate) soap before surgery.  CHG is an antiseptic cleaner which kills germs and bonds with the skin to continue killing germs even after washing. Please DO NOT use if you have an allergy to CHG or antibacterial soaps.  If your skin becomes reddened/irritated stop using the CHG and inform your nurse when you arrive at Short Stay. Do not  shave (including legs and underarms) for at least 48 hours prior to the first CHG shower.  You may shave your face/neck. Please follow these instructions carefully:  1.  Shower with CHG Soap the night before surgery and the  morning of Surgery.  2.  If you choose to wash your hair, wash your hair first as usual with your  normal  shampoo.  3.  After you shampoo, rinse your hair and body thoroughly to remove the  shampoo.                           4.  Use CHG as you would any other liquid soap.  You can apply chg directly  to the skin and wash                       Gently with a scrungie or clean washcloth.  5.  Apply the CHG Soap to your body ONLY FROM THE NECK DOWN.   Do not use on face/ open                           Wound or open sores. Avoid contact with eyes, ears mouth and genitals (private parts).                       Wash face,  Genitals (private parts) with your normal soap.             6.  Wash thoroughly, paying special attention to the area where your surgery  will be performed.  7.  Thoroughly rinse your body with warm  water from the neck down.  8.  DO NOT shower/wash with your normal soap after using and rinsing off  the CHG Soap.                9.  Pat yourself dry with a clean towel.            10.  Wear clean pajamas.            11.  Place clean sheets on your bed the night of your first shower and do not  sleep with pets. Day of Surgery : Do not apply any lotions/deodorants the morning of surgery.  Please wear clean clothes to the hospital/surgery center.  FAILURE TO FOLLOW THESE INSTRUCTIONS MAY RESULT IN THE CANCELLATION OF YOUR SURGERY PATIENT SIGNATURE_________________________________  NURSE SIGNATURE__________________________________  ________________________________________________________________________

## 2018-09-10 NOTE — Progress Notes (Signed)
ekg 11-15-17 epic   Ct chest 07-02-2018 epic   Echo 10-10-17 epic   lov cards 02-15-18 epic   lov pulm 03-21-18 epic

## 2018-09-11 ENCOUNTER — Encounter (HOSPITAL_COMMUNITY): Payer: Self-pay

## 2018-09-11 ENCOUNTER — Encounter (HOSPITAL_COMMUNITY)
Admission: RE | Admit: 2018-09-11 | Discharge: 2018-09-11 | Disposition: A | Discharge status: Home / Self Care | Payer: Medicare Other | Source: Ambulatory Visit | Attending: Urology | Admitting: Urology

## 2018-09-11 ENCOUNTER — Other Ambulatory Visit: Payer: Self-pay

## 2018-09-11 DIAGNOSIS — Q6239 Other obstructive defects of renal pelvis and ureter: Secondary | ICD-10-CM | POA: Insufficient documentation

## 2018-09-11 DIAGNOSIS — Z01812 Encounter for preprocedural laboratory examination: Secondary | ICD-10-CM | POA: Insufficient documentation

## 2018-09-11 HISTORY — DX: Hematuria, unspecified: R31.9

## 2018-09-11 LAB — BASIC METABOLIC PANEL
Anion gap: 10 (ref 5–15)
BUN: 33 mg/dL — ABNORMAL HIGH (ref 8–23)
CO2: 20 mmol/L — ABNORMAL LOW (ref 22–32)
Calcium: 8.8 mg/dL — ABNORMAL LOW (ref 8.9–10.3)
Chloride: 108 mmol/L (ref 98–111)
Creatinine, Ser: 1.47 mg/dL — ABNORMAL HIGH (ref 0.61–1.24)
GFR calc Af Amer: 50 mL/min — ABNORMAL LOW (ref >60)
GFR calc non Af Amer: 43 mL/min — ABNORMAL LOW (ref >60)
Glucose, Bld: 151 mg/dL — ABNORMAL HIGH (ref 70–99)
Potassium: 4.2 mmol/L (ref 3.5–5.1)
Sodium: 138 mmol/L (ref 135–145)

## 2018-09-11 LAB — GLUCOSE, CAPILLARY: Glucose-Capillary: 141 mg/dL — ABNORMAL HIGH (ref 70–99)

## 2018-09-11 LAB — CBC
HCT: 29.6 % — ABNORMAL LOW (ref 39.0–52.0)
Hemoglobin: 9.3 g/dL — ABNORMAL LOW (ref 13.0–17.0)
MCH: 27.8 pg (ref 26.0–34.0)
MCHC: 31.4 g/dL (ref 30.0–36.0)
MCV: 88.6 fL (ref 80.0–100.0)
Platelets: 189 10*3/uL (ref 150–400)
RBC: 3.34 MIL/uL — ABNORMAL LOW (ref 4.22–5.81)
RDW: 16.1 % — ABNORMAL HIGH (ref 11.5–15.5)
WBC: 5.3 10*3/uL (ref 4.0–10.5)
nRBC: 0 % (ref 0.0–0.2)

## 2018-09-11 LAB — HEMOGLOBIN A1C
Hgb A1c MFr Bld: 6.7 % — ABNORMAL HIGH (ref 4.8–5.6)
Mean Plasma Glucose: 145.59 mg/dL
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
## 2018-09-13 NOTE — Anesthesia Preprocedure Evaluation (Addendum)
Anesthesia Evaluation  Patient identified by MRN, date of birth, ID band Patient awake    Reviewed: Allergy & Precautions, NPO status , Patient's Chart, lab work & pertinent test results  Airway Mallampati: I  TM Distance: >3 FB Neck ROM: Full    Dental no notable dental hx. (+) Edentulous Upper, Upper Dentures, Partial Lower, Missing, Poor Dentition   Pulmonary neg pulmonary ROS,    Pulmonary exam normal breath sounds clear to auscultation       Cardiovascular hypertension, + CAD  negative cardio ROS  + dysrhythmias Atrial Fibrillation + Valvular Problems/Murmurs  Rhythm:Regular Rate:Normal  EKG: 11/15/17 Rate 70 bpm Normal sinus rhythm  Inferior infarct, age undetermined Anterior infarct, age undetermined  CV: Echo 10/10/17 Study Conclusions  - Left ventricle: The cavity size was normal. Wall thickness was increased in a pattern of mild LVH. Systolic function was vigorous. The estimated ejection fraction was in the range of 65% to 70%. Wall motion was normal; there were no regional wall motion abnormalities. Doppler parameters are consistent with abnormal left ventricular relaxation (grade 1 diastolic dysfunction). The E/e&' ratio is between 8-15, suggesting indeterminate LV filling pressure. - Aortic valve: Sclerosis without stenosis. There was no regurgitation. - Mitral valve: Mildly thickened leaflets . There was trivial regurgitation. - Left atrium: The atrium was normal in size. - Inferior vena cava: The vessel was normal in size. The respirophasic diameter changes were in the normal range (>= 50%), consistent with normal central venous pressure.  Impressions:  - Compared to a prior study in 2016, the LVEF is higher at 65-70%. The aortic valve is sclerotic, but not stenotic.   Neuro/Psych negative neurological ROS  negative psych ROS   GI/Hepatic negative GI ROS, Neg liver ROS,  GERD  Medicated,  Endo/Other  negative endocrine ROSdiabetes, Type 2  Renal/GU CRFRenal diseasenegative Renal ROS  negative genitourinary   Musculoskeletal negative musculoskeletal ROS (+)   Abdominal   Peds negative pediatric ROS (+)  Hematology negative hematology ROS (+) Blood dyscrasia, anemia ,   Anesthesia Other Findings   Reproductive/Obstetrics negative OB ROS                            Anesthesia Physical Anesthesia Plan  ASA: IV  Anesthesia Plan: General   Post-op Pain Management:    Induction: Intravenous  PONV Risk Score and Plan: 2 and Ondansetron and Treatment may vary due to age or medical condition  Airway Management Planned: Oral ETT and LMA  Additional Equipment:   Intra-op Plan:   Post-operative Plan: Extubation in OR  Informed Consent:   Plan Discussed with: Anesthesiologist, CRNA and Surgeon  Anesthesia Plan Comments: (See PAT note 09/11/18, Konrad Felix, PA-C)       Anesthesia Quick Evaluation

## 2018-09-14 NOTE — Progress Notes (Deleted)
RN CONTACTED PT VIA LISTED PHONE NUMBER. PATIENT DAUGHTER LISA, ANSWERED.  SHE STATES THAT SHE TAKES HIM TO HIS APPT. RN INFORMED HER THAT COVID TESTING WILL NOT BE AVAILABLE ON Monday 09-17-2018 DUE TO THE NATIONAL HOLIDAY AND THAT HER FATHER WOULD NEED TO COME ON Tuesday MAY 26 FOR HIS COVID TESTING. SHE VERBALIZED UNDERSTANDING AND  THANKED RN FOR CALLING .

## 2018-09-14 NOTE — Progress Notes (Signed)
RN CONTACTED PT VIA LISTED PHONE NUMBER. PATIENT DAUGHTER LISA, ANSWERED.  SHE STATES THAT SHE TAKES HIM TO HIS APPT. RN INFORMED HER THAT COVID TESTING WILL NOT BE AVAILABLE ON Monday 09-17-2018 DUE TO THE NATIONAL HOLIDAY AND THAT HER FATHER WOULD NEED TO COME ON Tuesday MAY 26 FOR HIS COVID TESTING. SHE VERBALIZED UNDERSTANDING AND  THANKED RN FOR CALLING .

## 2018-09-18 ENCOUNTER — Other Ambulatory Visit: Payer: Self-pay

## 2018-09-18 ENCOUNTER — Other Ambulatory Visit (HOSPITAL_COMMUNITY)
Admission: RE | Admit: 2018-09-18 | Discharge: 2018-09-18 | Disposition: A | Discharge status: Home / Self Care | Payer: Medicare Other | Source: Ambulatory Visit | Attending: Urology | Admitting: Urology

## 2018-09-18 DIAGNOSIS — Z1159 Encounter for screening for other viral diseases: Secondary | ICD-10-CM | POA: Insufficient documentation

## 2018-09-18 LAB — SARS CORONAVIRUS 2 BY RT PCR (HOSPITAL ORDER, PERFORMED IN ~~LOC~~ HOSPITAL LAB): SARS Coronavirus 2: NEGATIVE
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
## 2018-09-20 ENCOUNTER — Ambulatory Visit (HOSPITAL_COMMUNITY): Payer: Medicare Other | Admitting: Physician Assistant

## 2018-09-20 ENCOUNTER — Ambulatory Visit (HOSPITAL_COMMUNITY): Payer: Medicare Other | Admitting: Anesthesiology

## 2018-09-20 ENCOUNTER — Telehealth (HOSPITAL_COMMUNITY): Payer: Self-pay | Admitting: *Deleted

## 2018-09-20 ENCOUNTER — Encounter (HOSPITAL_COMMUNITY): Payer: Self-pay

## 2018-09-20 ENCOUNTER — Ambulatory Visit (HOSPITAL_COMMUNITY): Payer: Medicare Other

## 2018-09-20 ENCOUNTER — Ambulatory Visit (HOSPITAL_COMMUNITY)
Admission: RE | Admit: 2018-09-20 | Discharge: 2018-09-20 | Disposition: A | Discharge status: Home / Self Care | Payer: Medicare Other | Attending: Urology | Admitting: Urology

## 2018-09-20 ENCOUNTER — Encounter (HOSPITAL_COMMUNITY)
Admission: RE | Disposition: A | Discharge status: Home / Self Care | Payer: Self-pay | Source: Home / Self Care | Attending: Urology

## 2018-09-20 DIAGNOSIS — I10 Essential (primary) hypertension: Secondary | ICD-10-CM | POA: Diagnosis not present

## 2018-09-20 DIAGNOSIS — I2510 Atherosclerotic heart disease of native coronary artery without angina pectoris: Secondary | ICD-10-CM | POA: Insufficient documentation

## 2018-09-20 DIAGNOSIS — Z79899 Other long term (current) drug therapy: Secondary | ICD-10-CM | POA: Diagnosis not present

## 2018-09-20 DIAGNOSIS — N135 Crossing vessel and stricture of ureter without hydronephrosis: Secondary | ICD-10-CM | POA: Diagnosis not present

## 2018-09-20 DIAGNOSIS — E291 Testicular hypofunction: Secondary | ICD-10-CM | POA: Insufficient documentation

## 2018-09-20 DIAGNOSIS — I129 Hypertensive chronic kidney disease with stage 1 through stage 4 chronic kidney disease, or unspecified chronic kidney disease: Secondary | ICD-10-CM | POA: Diagnosis not present

## 2018-09-20 DIAGNOSIS — E119 Type 2 diabetes mellitus without complications: Secondary | ICD-10-CM | POA: Insufficient documentation

## 2018-09-20 DIAGNOSIS — N183 Chronic kidney disease, stage 3 (moderate): Secondary | ICD-10-CM | POA: Diagnosis not present

## 2018-09-20 DIAGNOSIS — Z87891 Personal history of nicotine dependence: Secondary | ICD-10-CM | POA: Diagnosis not present

## 2018-09-20 DIAGNOSIS — C61 Malignant neoplasm of prostate: Secondary | ICD-10-CM | POA: Diagnosis not present

## 2018-09-20 DIAGNOSIS — I4891 Unspecified atrial fibrillation: Secondary | ICD-10-CM | POA: Diagnosis not present

## 2018-09-20 DIAGNOSIS — Z8572 Personal history of non-Hodgkin lymphomas: Secondary | ICD-10-CM | POA: Insufficient documentation

## 2018-09-20 DIAGNOSIS — N131 Hydronephrosis with ureteral stricture, not elsewhere classified: Secondary | ICD-10-CM | POA: Diagnosis not present

## 2018-09-20 DIAGNOSIS — Z794 Long term (current) use of insulin: Secondary | ICD-10-CM | POA: Diagnosis not present

## 2018-09-20 DIAGNOSIS — K219 Gastro-esophageal reflux disease without esophagitis: Secondary | ICD-10-CM | POA: Diagnosis not present

## 2018-09-20 DIAGNOSIS — C7982 Secondary malignant neoplasm of genital organs: Secondary | ICD-10-CM | POA: Diagnosis not present

## 2018-09-20 DIAGNOSIS — Z7901 Long term (current) use of anticoagulants: Secondary | ICD-10-CM | POA: Insufficient documentation

## 2018-09-20 HISTORY — PX: CYSTOSCOPY W/ URETERAL STENT PLACEMENT: SHX1429

## 2018-09-20 LAB — GLUCOSE, CAPILLARY: Glucose-Capillary: 118 mg/dL — ABNORMAL HIGH (ref 70–99)

## 2018-09-20 SURGERY — CYSTOSCOPY, FLEXIBLE, WITH STENT REPLACEMENT
Anesthesia: General | Site: Ureter | Laterality: Bilateral

## 2018-09-20 MED ORDER — CIPROFLOXACIN IN D5W 400 MG/200ML IV SOLN
400.0000 mg | Freq: Once | INTRAVENOUS | Status: AC
  Administered 2018-09-20: 400 mg via INTRAVENOUS
  Filled 2018-09-20: qty 200

## 2018-09-20 MED ORDER — ONDANSETRON HCL 4 MG/2ML IJ SOLN
INTRAMUSCULAR | Status: DC | PRN
  Administered 2018-09-20: 4 mg via INTRAVENOUS

## 2018-09-20 MED ORDER — FENTANYL CITRATE (PF) 100 MCG/2ML IJ SOLN
INTRAMUSCULAR | Status: AC
  Filled 2018-09-20: qty 2

## 2018-09-20 MED ORDER — LIDOCAINE HCL (CARDIAC) PF 100 MG/5ML IV SOSY
PREFILLED_SYRINGE | INTRAVENOUS | Status: DC | PRN
  Administered 2018-09-20: 80 mg via INTRAVENOUS

## 2018-09-20 MED ORDER — PROPOFOL 10 MG/ML IV BOLUS
INTRAVENOUS | Status: DC | PRN
  Administered 2018-09-20: 150 mg via INTRAVENOUS

## 2018-09-20 MED ORDER — FENTANYL CITRATE (PF) 100 MCG/2ML IJ SOLN
INTRAMUSCULAR | Status: DC | PRN
  Administered 2018-09-20: 50 ug via INTRAVENOUS

## 2018-09-20 MED ORDER — LACTATED RINGERS IV SOLN
INTRAVENOUS | Status: DC
  Administered 2018-09-20 (×2): via INTRAVENOUS

## 2018-09-20 MED ORDER — EPHEDRINE SULFATE 50 MG/ML IJ SOLN
INTRAMUSCULAR | Status: DC | PRN
  Administered 2018-09-20 (×2): 10 mg via INTRAVENOUS
  Administered 2018-09-20: 5 mg via INTRAVENOUS

## 2018-09-20 MED ORDER — ONDANSETRON HCL 4 MG/2ML IJ SOLN
INTRAMUSCULAR | Status: AC
  Filled 2018-09-20: qty 4

## 2018-09-20 MED ORDER — EPHEDRINE 5 MG/ML INJ
INTRAVENOUS | Status: AC
  Filled 2018-09-20: qty 10

## 2018-09-20 MED ORDER — PROPOFOL 10 MG/ML IV BOLUS
INTRAVENOUS | Status: AC
  Filled 2018-09-20: qty 20

## 2018-09-20 SURGICAL SUPPLY — 14 items
BAG URO CATCHER STRL LF (MISCELLANEOUS) ×3 IMPLANT
CATH INTERMIT  6FR 70CM (CATHETERS) ×3 IMPLANT
CLOTH BEACON ORANGE TIMEOUT ST (SAFETY) ×3 IMPLANT
COVER WAND RF STERILE (DRAPES) IMPLANT
GLOVE BIOGEL M STRL SZ7.5 (GLOVE) ×3 IMPLANT
GOWN STRL REUS W/TWL LRG LVL3 (GOWN DISPOSABLE) ×6 IMPLANT
GUIDEWIRE STR DUAL SENSOR (WIRE) ×3 IMPLANT
KIT TURNOVER KIT A (KITS) IMPLANT
MANIFOLD NEPTUNE II (INSTRUMENTS) ×3 IMPLANT
PACK CYSTO (CUSTOM PROCEDURE TRAY) ×3 IMPLANT
STENT URO INLAY 6FRX24CM (STENTS) ×6 IMPLANT
TUBING CONNECTING 10 (TUBING) ×2 IMPLANT
TUBING CONNECTING 10' (TUBING) ×1
TUBING UROLOGY SET (TUBING) IMPLANT

## 2018-09-20 NOTE — Anesthesia Procedure Notes (Signed)
Procedure Name: LMA Insertion Date/Time: 09/20/2018 12:05 PM Performed by: Lavina Hamman, CRNA Pre-anesthesia Checklist: Patient identified, Emergency Drugs available, Suction available and Patient being monitored Patient Re-evaluated:Patient Re-evaluated prior to induction Oxygen Delivery Method: Circle System Utilized Preoxygenation: Pre-oxygenation with 100% oxygen Induction Type: IV induction Ventilation: Mask ventilation without difficulty LMA: LMA inserted LMA Size: 4.0 Number of attempts: 1 Airway Equipment and Method: Bite block Placement Confirmation: positive ETCO2 Tube secured with: Tape Dental Injury: Teeth and Oropharynx as per pre-operative assessment

## 2018-09-20 NOTE — Op Note (Signed)
Preoperative diagnosis: 1. Metastatic prostate cancer 2. Bilateral ureteral obstruction  Postoperative diagnosis: 1. Metastatic prostate cancer 2. Bilateral ureteral obstruction  Procedure:  1. Cystoscopy 2. Bilateralureteral stent placement (6 x 24 Bard Inlay Optima)  Surgeon: Roxy Horseman, Brooke Bonito. M.D.  Anesthesia: General  Complications: None  EBL: Minimal  Specimens: None  Intraoperative findings: Stents were moderately to severely encrusted.  Indication:Michael C Greesonis a85 y.o.patient with bilateral ureteral obstruction. After reviewing the management options for treatment, he elected to proceed with the above surgical procedure(s). We have discussed the potential benefits and risks of the procedure, side effects of the proposed treatment, the likelihood of the patient achieving the goals of the procedure, and any potential problems that might occur during the procedure or recuperation. Informed consent has been obtained.  Description of procedure:  The patient was taken to the operating room and general anesthesia was induced. The patient was placed in the dorsal lithotomy position, prepped and draped in the usual sterile fashion, and preoperative antibiotics were administered. A preoperative time-out was performed.   Cystourethroscopy was performed. The patient's urethra was examined and was unremarkable. The bladder was then systematically examined in its entirety. There was no evidence for any bladder tumors, stones, or other mucosal pathology.   Attention then turned to therightureteral orifice and the patient's indwelling ureteral stent was identified and brought out to the urethral meatus with the flexible graspers.  A 0.38 sensor guidewire was then advanced up therightureter into the renal pelvis under fluoroscopic guidance. The wire was then backloaded through the cystoscope and a ureteral stent was advance over the wire using  Seldinger technique. The stent was positioned appropriately under fluoroscopic and cystoscopic guidanceafter a retrograde pyelogram was performed to confirm appropriate position in the renal collecting system. The wire was then removed with an adequate stent curl noted in the renal pelvis as well as in the bladder.  Attention then turned to theleftureteral orifice and the patient's indwelling ureteral stent was identified and brought out to the urethral meatus with the flexible graspers.  A 0.38 sensor guidewire was then advanced up theleftureter into the renal pelvis under fluoroscopic guidance. The wire was then backloaded through the cystoscope and a ureteral stent was advance over the wire using Seldinger technique. The stent was positioned appropriately under fluoroscopic and cystoscopic guidanceafter confirming the appropriate position with a retrograde pyelogram as above.The wire was then removed with an adequate stent curl noted in the renal pelvis as well as in the bladder.  The bladder was then emptied and the procedure ended. The patient appeared to tolerate the procedure well and without complications. The patient was able to be awakened and transferred to the recovery unit in satisfactory condition.

## 2018-09-20 NOTE — Transfer of Care (Signed)
Immediate Anesthesia Transfer of Care Note  Patient: [name]  Procedure(s) Performed: Procedure(s): CYSTOSCOPY WITH STENT EXCHANGE (Bilateral)  Patient Location: PACU  Anesthesia Type:General  Level of Consciousness:  sedated, patient cooperative and responds to stimulation  Airway & Oxygen Therapy:Patient Spontanous Breathing and Patient connected to face mask oxgen  Post-op Assessment:  Report given to PACU RN and Post -op Vital signs reviewed and stable  Post vital signs:  Reviewed and stable  Last Vitals:  Vitals:   09/20/18 1033  BP: 133/64  Pulse: 73  Resp: 18  Temp: 36.5 C  SpO2: 485%    Complications: No apparent anesthesia complications

## 2018-09-20 NOTE — Discharge Instructions (Signed)

## 2018-09-21 ENCOUNTER — Encounter (HOSPITAL_COMMUNITY): Payer: Self-pay | Admitting: Urology

## 2018-09-21 DIAGNOSIS — N3000 Acute cystitis without hematuria: Secondary | ICD-10-CM | POA: Diagnosis not present

## 2018-09-21 NOTE — Anesthesia Postprocedure Evaluation (Signed)
Anesthesia Post Note  Patient: [name]  Procedure(s) Performed: CYSTOSCOPY WITH STENT EXCHANGE (Bilateral Ureter)     Patient location during evaluation: PACU Anesthesia Type: General Level of consciousness: awake and alert Pain management: pain level controlled Vital Signs Assessment: post-procedure vital signs reviewed and stable Respiratory status: spontaneous breathing, nonlabored ventilation, respiratory function stable and patient connected to nasal cannula oxygen Cardiovascular status: blood pressure returned to baseline and stable Postop Assessment: no apparent nausea or vomiting Anesthetic complications: no    Last Vitals:  Vitals:   09/20/18 1315 09/20/18 1330  BP: 117/62 117/62  Pulse: 72   Resp: 11   Temp:  36.7 C  SpO2: 100%     Last Pain:  Vitals:   09/20/18 1330  TempSrc:   PainSc: 0-No pain                 Kloie Whiting

## 2018-10-03 ENCOUNTER — Encounter: Payer: Self-pay | Admitting: Oncology
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
## 2018-10-04 ENCOUNTER — Other Ambulatory Visit: Payer: Self-pay

## 2018-10-04 ENCOUNTER — Inpatient Hospital Stay: Payer: Medicare Other | Attending: Oncology

## 2018-10-04 ENCOUNTER — Other Ambulatory Visit: Payer: Medicare Other

## 2018-10-04 ENCOUNTER — Inpatient Hospital Stay: Payer: Medicare Other

## 2018-10-04 ENCOUNTER — Inpatient Hospital Stay (HOSPITAL_BASED_OUTPATIENT_CLINIC_OR_DEPARTMENT_OTHER): Payer: Medicare Other | Admitting: Oncology

## 2018-10-04 ENCOUNTER — Other Ambulatory Visit: Payer: Self-pay | Admitting: Oncology

## 2018-10-04 VITALS — BP 119/48 | HR 64 | Temp 97.8°F | Resp 18 | Ht 68.0 in | Wt 160.3 lb

## 2018-10-04 VITALS — BP 119/73 | HR 70 | Resp 18

## 2018-10-04 DIAGNOSIS — C8219 Follicular lymphoma grade II, extranodal and solid organ sites: Secondary | ICD-10-CM

## 2018-10-04 DIAGNOSIS — Z8249 Family history of ischemic heart disease and other diseases of the circulatory system: Secondary | ICD-10-CM

## 2018-10-04 DIAGNOSIS — C61 Malignant neoplasm of prostate: Secondary | ICD-10-CM

## 2018-10-04 DIAGNOSIS — C8590 Non-Hodgkin lymphoma, unspecified, unspecified site: Secondary | ICD-10-CM | POA: Insufficient documentation

## 2018-10-04 DIAGNOSIS — I4891 Unspecified atrial fibrillation: Secondary | ICD-10-CM | POA: Insufficient documentation

## 2018-10-04 DIAGNOSIS — Z79899 Other long term (current) drug therapy: Secondary | ICD-10-CM

## 2018-10-04 DIAGNOSIS — Z9221 Personal history of antineoplastic chemotherapy: Secondary | ICD-10-CM | POA: Insufficient documentation

## 2018-10-04 DIAGNOSIS — Z7901 Long term (current) use of anticoagulants: Secondary | ICD-10-CM | POA: Diagnosis not present

## 2018-10-04 DIAGNOSIS — C8580 Other specified types of non-Hodgkin lymphoma, unspecified site: Secondary | ICD-10-CM

## 2018-10-04 DIAGNOSIS — C8258 Diffuse follicle center lymphoma, lymph nodes of multiple sites: Secondary | ICD-10-CM

## 2018-10-04 DIAGNOSIS — Z5112 Encounter for antineoplastic immunotherapy: Secondary | ICD-10-CM | POA: Diagnosis not present

## 2018-10-04 DIAGNOSIS — Z809 Family history of malignant neoplasm, unspecified: Secondary | ICD-10-CM | POA: Insufficient documentation

## 2018-10-04 DIAGNOSIS — D631 Anemia in chronic kidney disease: Secondary | ICD-10-CM

## 2018-10-04 DIAGNOSIS — C8203 Follicular lymphoma grade I, intra-abdominal lymph nodes: Secondary | ICD-10-CM

## 2018-10-04 DIAGNOSIS — D509 Iron deficiency anemia, unspecified: Secondary | ICD-10-CM | POA: Diagnosis not present

## 2018-10-04 DIAGNOSIS — Z9181 History of falling: Secondary | ICD-10-CM | POA: Insufficient documentation

## 2018-10-04 DIAGNOSIS — Z9049 Acquired absence of other specified parts of digestive tract: Secondary | ICD-10-CM | POA: Diagnosis not present

## 2018-10-04 DIAGNOSIS — K521 Toxic gastroenteritis and colitis: Secondary | ICD-10-CM | POA: Diagnosis not present

## 2018-10-04 DIAGNOSIS — N183 Chronic kidney disease, stage 3 unspecified: Secondary | ICD-10-CM

## 2018-10-04 DIAGNOSIS — E119 Type 2 diabetes mellitus without complications: Secondary | ICD-10-CM | POA: Diagnosis not present

## 2018-10-04 DIAGNOSIS — Z95828 Presence of other vascular implants and grafts: Secondary | ICD-10-CM

## 2018-10-04 LAB — COMPREHENSIVE METABOLIC PANEL
ALT: 35 U/L (ref 0–44)
AST: 47 U/L — ABNORMAL HIGH (ref 15–41)
Albumin: 3.7 g/dL (ref 3.5–5.0)
Alkaline Phosphatase: 73 U/L (ref 38–126)
Anion gap: 11 (ref 5–15)
BUN: 27 mg/dL — ABNORMAL HIGH (ref 8–23)
CO2: 22 mmol/L (ref 22–32)
Calcium: 8.9 mg/dL (ref 8.9–10.3)
Chloride: 107 mmol/L (ref 98–111)
Creatinine, Ser: 1.72 mg/dL — ABNORMAL HIGH (ref 0.61–1.24)
GFR calc Af Amer: 41 mL/min — ABNORMAL LOW (ref >60)
GFR calc non Af Amer: 36 mL/min — ABNORMAL LOW (ref >60)
Glucose, Bld: 185 mg/dL — ABNORMAL HIGH (ref 70–99)
Potassium: 4.1 mmol/L (ref 3.5–5.1)
Sodium: 140 mmol/L (ref 135–145)
Total Bilirubin: 0.4 mg/dL (ref 0.3–1.2)
Total Protein: 6.5 g/dL (ref 6.5–8.1)

## 2018-10-04 LAB — CBC WITH DIFFERENTIAL/PLATELET
Abs Immature Granulocytes: 0.02 10*3/uL (ref 0.00–0.07)
Basophils Absolute: 0 10*3/uL (ref 0.0–0.1)
Basophils Relative: 1 %
Eosinophils Absolute: 0.1 10*3/uL (ref 0.0–0.5)
Eosinophils Relative: 3 %
HCT: 29.6 % — ABNORMAL LOW (ref 39.0–52.0)
Hemoglobin: 8.9 g/dL — ABNORMAL LOW (ref 13.0–17.0)
Immature Granulocytes: 1 %
Lymphocytes Relative: 6 %
Lymphs Abs: 0.2 10*3/uL — ABNORMAL LOW (ref 0.7–4.0)
MCH: 26.4 pg (ref 26.0–34.0)
MCHC: 30.1 g/dL (ref 30.0–36.0)
MCV: 87.8 fL (ref 80.0–100.0)
Monocytes Absolute: 0.3 10*3/uL (ref 0.1–1.0)
Monocytes Relative: 9 %
Neutro Abs: 2.6 10*3/uL (ref 1.7–7.7)
Neutrophils Relative %: 80 %
Platelets: 193 10*3/uL (ref 150–400)
RBC: 3.37 MIL/uL — ABNORMAL LOW (ref 4.22–5.81)
RDW: 17 % — ABNORMAL HIGH (ref 11.5–15.5)
WBC: 3.2 10*3/uL — ABNORMAL LOW (ref 4.0–10.5)
nRBC: 0 % (ref 0.0–0.2)

## 2018-10-04 LAB — LACTATE DEHYDROGENASE: LDH: 138 U/L (ref 98–192)

## 2018-10-04 MED ORDER — HEPARIN SOD (PORK) LOCK FLUSH 100 UNIT/ML IV SOLN
500.0000 [IU] | Freq: Once | INTRAVENOUS | Status: AC | PRN
  Administered 2018-10-04: 500 [IU]
  Filled 2018-10-04: qty 5

## 2018-10-04 MED ORDER — DIPHENHYDRAMINE HCL 25 MG PO CAPS
ORAL_CAPSULE | ORAL | Status: AC
  Filled 2018-10-04: qty 1

## 2018-10-04 MED ORDER — SODIUM CHLORIDE 0.9 % IV SOLN
350.0000 mg/m2 | Freq: Once | INTRAVENOUS | Status: AC
  Administered 2018-10-04: 700 mg via INTRAVENOUS
  Filled 2018-10-04: qty 20

## 2018-10-04 MED ORDER — SODIUM CHLORIDE 0.9 % IJ SOLN
10.0000 mL | INTRAMUSCULAR | Status: DC | PRN
  Filled 2018-10-04: qty 10

## 2018-10-04 MED ORDER — SODIUM CHLORIDE 0.9% FLUSH
10.0000 mL | Freq: Once | INTRAVENOUS | Status: AC
  Administered 2018-10-04: 10 mL
  Filled 2018-10-04: qty 10

## 2018-10-04 MED ORDER — ACETAMINOPHEN 325 MG PO TABS
650.0000 mg | ORAL_TABLET | Freq: Once | ORAL | Status: AC
  Administered 2018-10-04: 12:00:00 650 mg via ORAL

## 2018-10-04 MED ORDER — ACETAMINOPHEN 325 MG PO TABS
ORAL_TABLET | ORAL | Status: AC
  Filled 2018-10-04: qty 2

## 2018-10-04 MED ORDER — DIPHENHYDRAMINE HCL 25 MG PO CAPS
25.0000 mg | ORAL_CAPSULE | Freq: Once | ORAL | Status: AC
  Administered 2018-10-04: 25 mg via ORAL

## 2018-10-04 MED ORDER — SODIUM CHLORIDE 0.9 % IV SOLN
Freq: Once | INTRAVENOUS | Status: AC
  Administered 2018-10-04: 11:00:00 via INTRAVENOUS
  Filled 2018-10-04: qty 250

## 2018-10-04 NOTE — Patient Instructions (Signed)
[facility] Discharge Instructions for Patients Receiving Chemotherapy  Today you received the following chemotherapy agents:  Rituxan   To help prevent nausea and vomiting after your treatment, we encourage you to take your nausea medication as prescribed.   If you develop nausea and vomiting that is not controlled by your nausea medication, call the clinic.   BELOW ARE SYMPTOMS THAT SHOULD BE REPORTED IMMEDIATELY:  *FEVER GREATER THAN 100.5 F  *CHILLS WITH OR WITHOUT FEVER  NAUSEA AND VOMITING THAT IS NOT CONTROLLED WITH YOUR NAUSEA MEDICATION  *UNUSUAL [name]  *UNUSUAL BRUISING OR BLEEDING  TENDERNESS IN MOUTH AND THROAT WITH OR WITHOUT PRESENCE OF ULCERS  *URINARY PROBLEMS  *BOWEL PROBLEMS  UNUSUAL RASH Items with * indicate a potential emergency and should be followed up as soon as possible.  Feel free to call the clinic should you have any questions or concerns. The clinic phone number is [phone].  Please show the CHEMO ALERT CARD at check-in to the Emergency Department and triage nurse.   

## 2018-10-04 NOTE — Patient Instructions (Signed)

## 2018-10-04 NOTE — Progress Notes (Signed)
Okay to treat today with Cr. 1.72, per Dr. Jana Hakim.

## 2018-10-05 ENCOUNTER — Telehealth: Payer: Self-pay | Admitting: Oncology

## 2018-10-05 ENCOUNTER — Ambulatory Visit: Payer: Medicare Other

## 2018-10-05 ENCOUNTER — Ambulatory Visit (INDEPENDENT_AMBULATORY_CARE_PROVIDER_SITE_OTHER): Payer: Medicare Other | Admitting: Family Medicine

## 2018-10-05 ENCOUNTER — Encounter: Payer: Self-pay | Admitting: Family Medicine

## 2018-10-05 ENCOUNTER — Other Ambulatory Visit: Payer: Medicare Other

## 2018-10-05 DIAGNOSIS — M25561 Pain in right knee: Secondary | ICD-10-CM | POA: Diagnosis not present
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
## 2018-10-05 NOTE — Telephone Encounter (Signed)
Could not reach patient will mail schedule

## 2018-10-15 ENCOUNTER — Emergency Department (HOSPITAL_COMMUNITY)
Admission: EM | Admit: 2018-10-15 | Discharge: 2018-10-15 | Disposition: A | Discharge status: Home / Self Care | Payer: Medicare Other | Attending: Emergency Medicine | Admitting: Emergency Medicine

## 2018-10-15 ENCOUNTER — Other Ambulatory Visit: Payer: Self-pay

## 2018-10-15 ENCOUNTER — Encounter (HOSPITAL_COMMUNITY): Payer: Self-pay | Admitting: Emergency Medicine

## 2018-10-15 DIAGNOSIS — Z794 Long term (current) use of insulin: Secondary | ICD-10-CM | POA: Diagnosis not present

## 2018-10-15 DIAGNOSIS — Y999 Unspecified external cause status: Secondary | ICD-10-CM | POA: Insufficient documentation

## 2018-10-15 DIAGNOSIS — E1122 Type 2 diabetes mellitus with diabetic chronic kidney disease: Secondary | ICD-10-CM | POA: Diagnosis not present

## 2018-10-15 DIAGNOSIS — R21 Rash and other nonspecific skin eruption: Secondary | ICD-10-CM | POA: Diagnosis present

## 2018-10-15 DIAGNOSIS — I2510 Atherosclerotic heart disease of native coronary artery without angina pectoris: Secondary | ICD-10-CM | POA: Diagnosis not present

## 2018-10-15 DIAGNOSIS — S0083XA Contusion of other part of head, initial encounter: Secondary | ICD-10-CM | POA: Insufficient documentation

## 2018-10-15 DIAGNOSIS — I5042 Chronic combined systolic (congestive) and diastolic (congestive) heart failure: Secondary | ICD-10-CM | POA: Diagnosis not present

## 2018-10-15 DIAGNOSIS — I130 Hypertensive heart and chronic kidney disease with heart failure and stage 1 through stage 4 chronic kidney disease, or unspecified chronic kidney disease: Secondary | ICD-10-CM | POA: Insufficient documentation

## 2018-10-15 DIAGNOSIS — T148XXA Other injury of unspecified body region, initial encounter: Secondary | ICD-10-CM

## 2018-10-15 DIAGNOSIS — Y939 Activity, unspecified: Secondary | ICD-10-CM | POA: Diagnosis not present

## 2018-10-15 DIAGNOSIS — Z7901 Long term (current) use of anticoagulants: Secondary | ICD-10-CM | POA: Insufficient documentation

## 2018-10-15 DIAGNOSIS — J45909 Unspecified asthma, uncomplicated: Secondary | ICD-10-CM | POA: Insufficient documentation

## 2018-10-15 DIAGNOSIS — Y929 Unspecified place or not applicable: Secondary | ICD-10-CM | POA: Diagnosis not present

## 2018-10-15 DIAGNOSIS — N183 Chronic kidney disease, stage 3 (moderate): Secondary | ICD-10-CM | POA: Insufficient documentation

## 2018-10-15 DIAGNOSIS — X58XXXA Exposure to other specified factors, initial encounter: Secondary | ICD-10-CM | POA: Diagnosis not present

## 2018-10-15 DIAGNOSIS — Z79899 Other long term (current) drug therapy: Secondary | ICD-10-CM | POA: Diagnosis not present

## 2018-10-15 LAB — CBC WITH DIFFERENTIAL/PLATELET
Abs Immature Granulocytes: 0.09 10*3/uL — ABNORMAL HIGH (ref 0.00–0.07)
Basophils Absolute: 0 10*3/uL (ref 0.0–0.1)
Basophils Relative: 1 %
Eosinophils Absolute: 0.2 10*3/uL (ref 0.0–0.5)
Eosinophils Relative: 4 %
HCT: 28.9 % — ABNORMAL LOW (ref 39.0–52.0)
Hemoglobin: 9.3 g/dL — ABNORMAL LOW (ref 13.0–17.0)
Immature Granulocytes: 2 %
Lymphocytes Relative: 7 %
Lymphs Abs: 0.3 10*3/uL — ABNORMAL LOW (ref 0.7–4.0)
MCH: 27.8 pg (ref 26.0–34.0)
MCHC: 32.2 g/dL (ref 30.0–36.0)
MCV: 86.5 fL (ref 80.0–100.0)
Monocytes Absolute: 0.6 10*3/uL (ref 0.1–1.0)
Monocytes Relative: 13 %
Neutro Abs: 3.2 10*3/uL (ref 1.7–7.7)
Neutrophils Relative %: 73 %
Platelets: 165 10*3/uL (ref 150–400)
RBC: 3.34 MIL/uL — ABNORMAL LOW (ref 4.22–5.81)
RDW: 16.1 % — ABNORMAL HIGH (ref 11.5–15.5)
WBC: 4.3 10*3/uL (ref 4.0–10.5)
nRBC: 0 % (ref 0.0–0.2)

## 2018-10-15 NOTE — ED Triage Notes (Signed)
Pt reports he worked in his garden today. Reports that his granddaughter came in later and asked what was on his face. Pt states that he didn't know. Reports has itching on the red spots on his face.
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
## 2018-10-16 DIAGNOSIS — S70369A Insect bite (nonvenomous), unspecified thigh, initial encounter: Secondary | ICD-10-CM | POA: Diagnosis not present

## 2018-10-16 DIAGNOSIS — R238 Other skin changes: Secondary | ICD-10-CM | POA: Diagnosis not present

## 2018-10-16 DIAGNOSIS — W57XXXA Bitten or stung by nonvenomous insect and other nonvenomous arthropods, initial encounter: Secondary | ICD-10-CM | POA: Diagnosis not present

## 2018-10-16 DIAGNOSIS — C44609 Unspecified malignant neoplasm of skin of left upper limb, including shoulder: Secondary | ICD-10-CM | POA: Diagnosis not present

## 2018-10-16 DIAGNOSIS — N183 Chronic kidney disease, stage 3 (moderate): Secondary | ICD-10-CM | POA: Diagnosis not present

## 2018-10-19 DIAGNOSIS — Z9841 Cataract extraction status, right eye: Secondary | ICD-10-CM | POA: Diagnosis not present

## 2018-10-19 DIAGNOSIS — E113293 Type 2 diabetes mellitus with mild nonproliferative diabetic retinopathy without macular edema, bilateral: Secondary | ICD-10-CM | POA: Diagnosis not present

## 2018-10-19 DIAGNOSIS — H52223 Regular astigmatism, bilateral: Secondary | ICD-10-CM | POA: Diagnosis not present

## 2018-10-19 DIAGNOSIS — H40023 Open angle with borderline findings, high risk, bilateral: Secondary | ICD-10-CM | POA: Diagnosis not present

## 2018-10-19 DIAGNOSIS — Z9842 Cataract extraction status, left eye: Secondary | ICD-10-CM | POA: Diagnosis not present

## 2018-11-05 ENCOUNTER — Other Ambulatory Visit: Payer: Self-pay

## 2018-11-05 ENCOUNTER — Emergency Department (HOSPITAL_COMMUNITY): Payer: Medicare Other

## 2018-11-05 ENCOUNTER — Inpatient Hospital Stay (HOSPITAL_COMMUNITY)
Admission: EM | Admit: 2018-11-05 | Discharge: 2018-11-09 | DRG: 638 | Disposition: A | Discharge status: Home / Self Care | Payer: Medicare Other | Attending: Internal Medicine | Admitting: Internal Medicine

## 2018-11-05 ENCOUNTER — Encounter: Payer: Self-pay | Admitting: Oncology

## 2018-11-05 ENCOUNTER — Encounter (HOSPITAL_COMMUNITY): Payer: Self-pay | Admitting: Emergency Medicine

## 2018-11-05 DIAGNOSIS — Z20828 Contact with and (suspected) exposure to other viral communicable diseases: Secondary | ICD-10-CM | POA: Diagnosis not present

## 2018-11-05 DIAGNOSIS — Z951 Presence of aortocoronary bypass graft: Secondary | ICD-10-CM

## 2018-11-05 DIAGNOSIS — L0390 Cellulitis, unspecified: Secondary | ICD-10-CM | POA: Diagnosis present

## 2018-11-05 DIAGNOSIS — E782 Mixed hyperlipidemia: Secondary | ICD-10-CM | POA: Diagnosis present

## 2018-11-05 DIAGNOSIS — R7881 Bacteremia: Secondary | ICD-10-CM | POA: Diagnosis not present

## 2018-11-05 DIAGNOSIS — L03115 Cellulitis of right lower limb: Secondary | ICD-10-CM | POA: Diagnosis not present

## 2018-11-05 DIAGNOSIS — E119 Type 2 diabetes mellitus without complications: Secondary | ICD-10-CM

## 2018-11-05 DIAGNOSIS — L8990 Pressure ulcer of unspecified site, unspecified stage: Secondary | ICD-10-CM | POA: Insufficient documentation

## 2018-11-05 DIAGNOSIS — Z85828 Personal history of other malignant neoplasm of skin: Secondary | ICD-10-CM

## 2018-11-05 DIAGNOSIS — L089 Local infection of the skin and subcutaneous tissue, unspecified: Secondary | ICD-10-CM | POA: Diagnosis not present

## 2018-11-05 DIAGNOSIS — D696 Thrombocytopenia, unspecified: Secondary | ICD-10-CM | POA: Diagnosis present

## 2018-11-05 DIAGNOSIS — I700 Atherosclerosis of aorta: Secondary | ICD-10-CM | POA: Diagnosis present

## 2018-11-05 DIAGNOSIS — Z7901 Long term (current) use of anticoagulants: Secondary | ICD-10-CM

## 2018-11-05 DIAGNOSIS — Z79899 Other long term (current) drug therapy: Secondary | ICD-10-CM

## 2018-11-05 DIAGNOSIS — I2510 Atherosclerotic heart disease of native coronary artery without angina pectoris: Secondary | ICD-10-CM | POA: Diagnosis present

## 2018-11-05 DIAGNOSIS — Z87891 Personal history of nicotine dependence: Secondary | ICD-10-CM

## 2018-11-05 DIAGNOSIS — E11628 Type 2 diabetes mellitus with other skin complications: Principal | ICD-10-CM | POA: Diagnosis present

## 2018-11-05 DIAGNOSIS — G4733 Obstructive sleep apnea (adult) (pediatric): Secondary | ICD-10-CM | POA: Diagnosis present

## 2018-11-05 DIAGNOSIS — I130 Hypertensive heart and chronic kidney disease with heart failure and stage 1 through stage 4 chronic kidney disease, or unspecified chronic kidney disease: Secondary | ICD-10-CM | POA: Diagnosis present

## 2018-11-05 DIAGNOSIS — Z9181 History of falling: Secondary | ICD-10-CM

## 2018-11-05 DIAGNOSIS — Z794 Long term (current) use of insulin: Secondary | ICD-10-CM

## 2018-11-05 DIAGNOSIS — I10 Essential (primary) hypertension: Secondary | ICD-10-CM | POA: Diagnosis present

## 2018-11-05 DIAGNOSIS — C8590 Non-Hodgkin lymphoma, unspecified, unspecified site: Secondary | ICD-10-CM | POA: Diagnosis present

## 2018-11-05 DIAGNOSIS — Z66 Do not resuscitate: Secondary | ICD-10-CM | POA: Diagnosis not present

## 2018-11-05 DIAGNOSIS — K8689 Other specified diseases of pancreas: Secondary | ICD-10-CM | POA: Diagnosis present

## 2018-11-05 DIAGNOSIS — I5042 Chronic combined systolic (congestive) and diastolic (congestive) heart failure: Secondary | ICD-10-CM | POA: Diagnosis present

## 2018-11-05 DIAGNOSIS — Z960 Presence of urogenital implants: Secondary | ICD-10-CM | POA: Diagnosis present

## 2018-11-05 DIAGNOSIS — Z9221 Personal history of antineoplastic chemotherapy: Secondary | ICD-10-CM

## 2018-11-05 DIAGNOSIS — Z23 Encounter for immunization: Secondary | ICD-10-CM | POA: Diagnosis not present

## 2018-11-05 DIAGNOSIS — Z87442 Personal history of urinary calculi: Secondary | ICD-10-CM

## 2018-11-05 DIAGNOSIS — L84 Corns and callosities: Secondary | ICD-10-CM | POA: Diagnosis present

## 2018-11-05 DIAGNOSIS — R509 Fever, unspecified: Secondary | ICD-10-CM | POA: Diagnosis present

## 2018-11-05 DIAGNOSIS — Z9079 Acquired absence of other genital organ(s): Secondary | ICD-10-CM

## 2018-11-05 DIAGNOSIS — E1122 Type 2 diabetes mellitus with diabetic chronic kidney disease: Secondary | ICD-10-CM | POA: Diagnosis present

## 2018-11-05 DIAGNOSIS — Z8546 Personal history of malignant neoplasm of prostate: Secondary | ICD-10-CM

## 2018-11-05 DIAGNOSIS — I495 Sick sinus syndrome: Secondary | ICD-10-CM | POA: Diagnosis present

## 2018-11-05 DIAGNOSIS — I480 Paroxysmal atrial fibrillation: Secondary | ICD-10-CM | POA: Diagnosis present

## 2018-11-05 DIAGNOSIS — L02611 Cutaneous abscess of right foot: Secondary | ICD-10-CM | POA: Diagnosis present

## 2018-11-05 DIAGNOSIS — M109 Gout, unspecified: Secondary | ICD-10-CM | POA: Diagnosis present

## 2018-11-05 DIAGNOSIS — D509 Iron deficiency anemia, unspecified: Secondary | ICD-10-CM | POA: Diagnosis present

## 2018-11-05 DIAGNOSIS — L97519 Non-pressure chronic ulcer of other part of right foot with unspecified severity: Secondary | ICD-10-CM | POA: Diagnosis not present

## 2018-11-05 DIAGNOSIS — N183 Chronic kidney disease, stage 3 unspecified: Secondary | ICD-10-CM | POA: Diagnosis present

## 2018-11-05 DIAGNOSIS — E1140 Type 2 diabetes mellitus with diabetic neuropathy, unspecified: Secondary | ICD-10-CM | POA: Diagnosis present

## 2018-11-05 DIAGNOSIS — F419 Anxiety disorder, unspecified: Secondary | ICD-10-CM | POA: Diagnosis present

## 2018-11-05 DIAGNOSIS — D631 Anemia in chronic kidney disease: Secondary | ICD-10-CM | POA: Diagnosis present

## 2018-11-05 DIAGNOSIS — Z888 Allergy status to other drugs, medicaments and biological substances status: Secondary | ICD-10-CM

## 2018-11-05 DIAGNOSIS — C8589 Other specified types of non-Hodgkin lymphoma, extranodal and solid organ sites: Secondary | ICD-10-CM | POA: Diagnosis present

## 2018-11-05 DIAGNOSIS — Z9049 Acquired absence of other specified parts of digestive tract: Secondary | ICD-10-CM

## 2018-11-05 DIAGNOSIS — Z791 Long term (current) use of non-steroidal anti-inflammatories (NSAID): Secondary | ICD-10-CM

## 2018-11-05 NOTE — ED Triage Notes (Signed)
Pt family reports that pt began having swelling and discoloration to right foot at arch and was running a fever of 100.9 at 8pm and was given 2 Tylenol. Pt is a chemo pt with last treatment on 10/04/2018

## 2018-11-06 ENCOUNTER — Encounter (HOSPITAL_COMMUNITY): Payer: Self-pay | Admitting: Emergency Medicine

## 2018-11-06 ENCOUNTER — Inpatient Hospital Stay (HOSPITAL_COMMUNITY): Payer: Medicare Other

## 2018-11-06 ENCOUNTER — Other Ambulatory Visit: Payer: Self-pay | Admitting: Oncology

## 2018-11-06 DIAGNOSIS — D509 Iron deficiency anemia, unspecified: Secondary | ICD-10-CM

## 2018-11-06 DIAGNOSIS — C8589 Other specified types of non-Hodgkin lymphoma, extranodal and solid organ sites: Secondary | ICD-10-CM | POA: Diagnosis present

## 2018-11-06 DIAGNOSIS — G4733 Obstructive sleep apnea (adult) (pediatric): Secondary | ICD-10-CM

## 2018-11-06 DIAGNOSIS — I340 Nonrheumatic mitral (valve) insufficiency: Secondary | ICD-10-CM | POA: Diagnosis not present

## 2018-11-06 DIAGNOSIS — I2510 Atherosclerotic heart disease of native coronary artery without angina pectoris: Secondary | ICD-10-CM | POA: Diagnosis present

## 2018-11-06 DIAGNOSIS — D631 Anemia in chronic kidney disease: Secondary | ICD-10-CM | POA: Diagnosis present

## 2018-11-06 DIAGNOSIS — R7881 Bacteremia: Secondary | ICD-10-CM | POA: Diagnosis present

## 2018-11-06 DIAGNOSIS — L089 Local infection of the skin and subcutaneous tissue, unspecified: Secondary | ICD-10-CM

## 2018-11-06 DIAGNOSIS — Z794 Long term (current) use of insulin: Secondary | ICD-10-CM | POA: Diagnosis not present

## 2018-11-06 DIAGNOSIS — Z8546 Personal history of malignant neoplasm of prostate: Secondary | ICD-10-CM | POA: Diagnosis not present

## 2018-11-06 DIAGNOSIS — I5042 Chronic combined systolic (congestive) and diastolic (congestive) heart failure: Secondary | ICD-10-CM | POA: Diagnosis present

## 2018-11-06 DIAGNOSIS — E1142 Type 2 diabetes mellitus with diabetic polyneuropathy: Secondary | ICD-10-CM

## 2018-11-06 DIAGNOSIS — I700 Atherosclerosis of aorta: Secondary | ICD-10-CM | POA: Diagnosis present

## 2018-11-06 DIAGNOSIS — S91301A Unspecified open wound, right foot, initial encounter: Secondary | ICD-10-CM | POA: Diagnosis not present

## 2018-11-06 DIAGNOSIS — I480 Paroxysmal atrial fibrillation: Secondary | ICD-10-CM

## 2018-11-06 DIAGNOSIS — N183 Chronic kidney disease, stage 3 (moderate): Secondary | ICD-10-CM | POA: Diagnosis not present

## 2018-11-06 DIAGNOSIS — L8990 Pressure ulcer of unspecified site, unspecified stage: Secondary | ICD-10-CM | POA: Insufficient documentation

## 2018-11-06 DIAGNOSIS — L02611 Cutaneous abscess of right foot: Secondary | ICD-10-CM | POA: Diagnosis present

## 2018-11-06 DIAGNOSIS — L03119 Cellulitis of unspecified part of limb: Secondary | ICD-10-CM | POA: Diagnosis not present

## 2018-11-06 DIAGNOSIS — E11628 Type 2 diabetes mellitus with other skin complications: Secondary | ICD-10-CM | POA: Diagnosis present

## 2018-11-06 DIAGNOSIS — D696 Thrombocytopenia, unspecified: Secondary | ICD-10-CM | POA: Diagnosis not present

## 2018-11-06 DIAGNOSIS — L0390 Cellulitis, unspecified: Secondary | ICD-10-CM | POA: Diagnosis present

## 2018-11-06 DIAGNOSIS — E782 Mixed hyperlipidemia: Secondary | ICD-10-CM | POA: Diagnosis present

## 2018-11-06 DIAGNOSIS — K8689 Other specified diseases of pancreas: Secondary | ICD-10-CM | POA: Diagnosis present

## 2018-11-06 DIAGNOSIS — E1122 Type 2 diabetes mellitus with diabetic chronic kidney disease: Secondary | ICD-10-CM | POA: Diagnosis present

## 2018-11-06 DIAGNOSIS — Z8572 Personal history of non-Hodgkin lymphomas: Secondary | ICD-10-CM | POA: Diagnosis not present

## 2018-11-06 DIAGNOSIS — I130 Hypertensive heart and chronic kidney disease with heart failure and stage 1 through stage 4 chronic kidney disease, or unspecified chronic kidney disease: Secondary | ICD-10-CM | POA: Diagnosis present

## 2018-11-06 DIAGNOSIS — L97519 Non-pressure chronic ulcer of other part of right foot with unspecified severity: Secondary | ICD-10-CM | POA: Diagnosis not present

## 2018-11-06 DIAGNOSIS — Z20828 Contact with and (suspected) exposure to other viral communicable diseases: Secondary | ICD-10-CM | POA: Diagnosis present

## 2018-11-06 DIAGNOSIS — Z23 Encounter for immunization: Secondary | ICD-10-CM | POA: Diagnosis not present

## 2018-11-06 DIAGNOSIS — R509 Fever, unspecified: Secondary | ICD-10-CM | POA: Diagnosis not present

## 2018-11-06 DIAGNOSIS — I495 Sick sinus syndrome: Secondary | ICD-10-CM | POA: Diagnosis present

## 2018-11-06 DIAGNOSIS — E1140 Type 2 diabetes mellitus with diabetic neuropathy, unspecified: Secondary | ICD-10-CM | POA: Diagnosis present

## 2018-11-06 DIAGNOSIS — I10 Essential (primary) hypertension: Secondary | ICD-10-CM | POA: Diagnosis not present

## 2018-11-06 DIAGNOSIS — L84 Corns and callosities: Secondary | ICD-10-CM | POA: Diagnosis present

## 2018-11-06 DIAGNOSIS — L03115 Cellulitis of right lower limb: Secondary | ICD-10-CM | POA: Diagnosis present

## 2018-11-06 DIAGNOSIS — Z66 Do not resuscitate: Secondary | ICD-10-CM | POA: Diagnosis present

## 2018-11-06 LAB — COMPREHENSIVE METABOLIC PANEL
ALT: 38 U/L (ref 0–44)
ALT: 34 U/L (ref 0–44)
AST: 47 U/L — ABNORMAL HIGH (ref 15–41)
AST: 38 U/L (ref 15–41)
Albumin: 4.1 g/dL (ref 3.5–5.0)
Albumin: 3.8 g/dL (ref 3.5–5.0)
Alkaline Phosphatase: 66 U/L (ref 38–126)
Alkaline Phosphatase: 58 U/L (ref 38–126)
Anion gap: 11 (ref 5–15)
Anion gap: 8 (ref 5–15)
BUN: 36 mg/dL — ABNORMAL HIGH (ref 8–23)
BUN: 31 mg/dL — ABNORMAL HIGH (ref 8–23)
CO2: 22 mmol/L (ref 22–32)
CO2: 23 mmol/L (ref 22–32)
Calcium: 9.1 mg/dL (ref 8.9–10.3)
Calcium: 9 mg/dL (ref 8.9–10.3)
Chloride: 100 mmol/L (ref 98–111)
Chloride: 105 mmol/L (ref 98–111)
Creatinine, Ser: 1.52 mg/dL — ABNORMAL HIGH (ref 0.61–1.24)
Creatinine, Ser: 1.35 mg/dL — ABNORMAL HIGH (ref 0.61–1.24)
GFR calc Af Amer: 48 mL/min — ABNORMAL LOW (ref >60)
GFR calc Af Amer: 55 mL/min — ABNORMAL LOW (ref >60)
GFR calc non Af Amer: 41 mL/min — ABNORMAL LOW (ref >60)
GFR calc non Af Amer: 48 mL/min — ABNORMAL LOW (ref >60)
Glucose, Bld: 148 mg/dL — ABNORMAL HIGH (ref 70–99)
Glucose, Bld: 172 mg/dL — ABNORMAL HIGH (ref 70–99)
Potassium: 4.2 mmol/L (ref 3.5–5.1)
Potassium: 4 mmol/L (ref 3.5–5.1)
Sodium: 133 mmol/L — ABNORMAL LOW (ref 135–145)
Sodium: 136 mmol/L (ref 135–145)
Total Bilirubin: 0.4 mg/dL (ref 0.3–1.2)
Total Bilirubin: 0.9 mg/dL (ref 0.3–1.2)
Total Protein: 6.9 g/dL (ref 6.5–8.1)
Total Protein: 6.4 g/dL — ABNORMAL LOW (ref 6.5–8.1)

## 2018-11-06 LAB — BLOOD CULTURE ID PANEL (REFLEXED)

## 2018-11-06 LAB — CBC WITH DIFFERENTIAL/PLATELET
Abs Immature Granulocytes: 0.1 10*3/uL — ABNORMAL HIGH (ref 0.00–0.07)
Basophils Absolute: 0 10*3/uL (ref 0.0–0.1)
Basophils Relative: 0 %
Eosinophils Absolute: 0.1 10*3/uL (ref 0.0–0.5)
Eosinophils Relative: 0 %
HCT: 30.6 % — ABNORMAL LOW (ref 39.0–52.0)
Hemoglobin: 9.7 g/dL — ABNORMAL LOW (ref 13.0–17.0)
Immature Granulocytes: 1 %
Lymphocytes Relative: 2 %
Lymphs Abs: 0.3 10*3/uL — ABNORMAL LOW (ref 0.7–4.0)
MCH: 27.2 pg (ref 26.0–34.0)
MCHC: 31.7 g/dL (ref 30.0–36.0)
MCV: 85.7 fL (ref 80.0–100.0)
Monocytes Absolute: 0.8 10*3/uL (ref 0.1–1.0)
Monocytes Relative: 6 %
Neutro Abs: 11.5 10*3/uL — ABNORMAL HIGH (ref 1.7–7.7)
Neutrophils Relative %: 91 %
Platelets: 145 10*3/uL — ABNORMAL LOW (ref 150–400)
RBC: 3.57 MIL/uL — ABNORMAL LOW (ref 4.22–5.81)
RDW: 16.3 % — ABNORMAL HIGH (ref 11.5–15.5)
WBC: 12.7 10*3/uL — ABNORMAL HIGH (ref 4.0–10.5)
nRBC: 0 % (ref 0.0–0.2)

## 2018-11-06 LAB — CBC
HCT: 29.3 % — ABNORMAL LOW (ref 39.0–52.0)
Hemoglobin: 8.9 g/dL — ABNORMAL LOW (ref 13.0–17.0)
MCH: 26.5 pg (ref 26.0–34.0)
MCHC: 30.4 g/dL (ref 30.0–36.0)
MCV: 87.2 fL (ref 80.0–100.0)
Platelets: 126 10*3/uL — ABNORMAL LOW (ref 150–400)
RBC: 3.36 MIL/uL — ABNORMAL LOW (ref 4.22–5.81)
RDW: 16.3 % — ABNORMAL HIGH (ref 11.5–15.5)
WBC: 10.1 10*3/uL (ref 4.0–10.5)
nRBC: 0 % (ref 0.0–0.2)

## 2018-11-06 LAB — SARS CORONAVIRUS 2 BY RT PCR (HOSPITAL ORDER, PERFORMED IN ~~LOC~~ HOSPITAL LAB): SARS Coronavirus 2: NEGATIVE

## 2018-11-06 MED ORDER — PIPERACILLIN-TAZOBACTAM 3.375 G IVPB 30 MIN
3.3750 g | Freq: Once | INTRAVENOUS | Status: AC
  Administered 2018-11-06: 3.375 g via INTRAVENOUS
  Filled 2018-11-06: qty 50

## 2018-11-06 MED ORDER — ACETAMINOPHEN 325 MG PO TABS
650.0000 mg | ORAL_TABLET | Freq: Four times a day (QID) | ORAL | Status: DC | PRN
  Administered 2018-11-06 – 2018-11-08 (×2): 650 mg via ORAL
  Filled 2018-11-06 (×2): qty 2

## 2018-11-06 MED ORDER — VITAMIN B-12 1000 MCG PO TABS
2500.0000 ug | ORAL_TABLET | Freq: Every day | ORAL | Status: DC
  Administered 2018-11-06 – 2018-11-09 (×4): 2500 ug via ORAL
  Filled 2018-11-06 (×4): qty 3

## 2018-11-06 MED ORDER — VANCOMYCIN HCL IN DEXTROSE 750-5 MG/150ML-% IV SOLN
750.0000 mg | Freq: Once | INTRAVENOUS | Status: AC
  Administered 2018-11-06: 750 mg via INTRAVENOUS
  Filled 2018-11-06: qty 150

## 2018-11-06 MED ORDER — ALLOPURINOL 300 MG PO TABS
300.0000 mg | ORAL_TABLET | Freq: Every day | ORAL | Status: DC
  Administered 2018-11-06 – 2018-11-09 (×4): 300 mg via ORAL
  Filled 2018-11-06 (×4): qty 1

## 2018-11-06 MED ORDER — ONE-A-DAY MENS 50+ ADVANTAGE PO TABS: 1.0000 | ORAL_TABLET | Freq: Every day | ORAL | Status: DC

## 2018-11-06 MED ORDER — PIPERACILLIN-TAZOBACTAM 3.375 G IVPB: 3.3750 g | Freq: Three times a day (TID) | INTRAVENOUS | Status: DC

## 2018-11-06 MED ORDER — TETANUS-DIPHTH-ACELL PERTUSSIS 5-2.5-18.5 LF-MCG/0.5 IM SUSP
0.5000 mL | Freq: Once | INTRAMUSCULAR | Status: AC
  Administered 2018-11-06: 0.5 mL via INTRAMUSCULAR
  Filled 2018-11-06: qty 0.5

## 2018-11-06 MED ORDER — ACETAMINOPHEN 650 MG RE SUPP: 650.0000 mg | Freq: Four times a day (QID) | RECTAL | Status: DC | PRN

## 2018-11-06 MED ORDER — PIPERACILLIN-TAZOBACTAM 3.375 G IVPB
3.3750 g | Freq: Three times a day (TID) | INTRAVENOUS | Status: DC
  Administered 2018-11-06 – 2018-11-09 (×11): 3.375 g via INTRAVENOUS
  Filled 2018-11-06 (×14): qty 50

## 2018-11-06 MED ORDER — VITAMIN B-12 2500 MCG SL SUBL: 2500.0000 ug | SUBLINGUAL_TABLET | Freq: Every morning | SUBLINGUAL | Status: DC

## 2018-11-06 MED ORDER — FENOFIBRATE 160 MG PO TABS
160.0000 mg | ORAL_TABLET | Freq: Every evening | ORAL | Status: DC
  Administered 2018-11-06 – 2018-11-09 (×3): 160 mg via ORAL
  Filled 2018-11-06 (×4): qty 1

## 2018-11-06 MED ORDER — VANCOMYCIN HCL 10 G IV SOLR
1250.0000 mg | INTRAVENOUS | Status: DC
  Administered 2018-11-07 – 2018-11-09 (×2): 1250 mg via INTRAVENOUS
  Filled 2018-11-06 (×2): qty 1250

## 2018-11-06 MED ORDER — POTASSIUM CHLORIDE IN NACL 20-0.9 MEQ/L-% IV SOLN
INTRAVENOUS | Status: AC
  Administered 2018-11-06: 06:00:00 via INTRAVENOUS
  Filled 2018-11-06: qty 1000

## 2018-11-06 MED ORDER — PANCRELIPASE (LIP-PROT-AMYL) 12000-38000 UNITS PO CPEP
12000.0000 [IU] | ORAL_CAPSULE | Freq: Three times a day (TID) | ORAL | Status: DC
  Administered 2018-11-06 – 2018-11-09 (×10): 12000 [IU] via ORAL
  Filled 2018-11-06 (×10): qty 1

## 2018-11-06 MED ORDER — SERTRALINE HCL 100 MG PO TABS
100.0000 mg | ORAL_TABLET | Freq: Every day | ORAL | Status: DC
  Administered 2018-11-06 – 2018-11-09 (×4): 100 mg via ORAL
  Filled 2018-11-06 (×4): qty 1

## 2018-11-06 MED ORDER — ADULT MULTIVITAMIN W/MINERALS CH
1.0000 | ORAL_TABLET | Freq: Every day | ORAL | Status: DC
  Administered 2018-11-06 – 2018-11-09 (×4): 1 via ORAL
  Filled 2018-11-06 (×4): qty 1

## 2018-11-06 MED ORDER — GABAPENTIN 300 MG PO CAPS
300.0000 mg | ORAL_CAPSULE | Freq: Two times a day (BID) | ORAL | Status: DC
  Administered 2018-11-06 – 2018-11-09 (×7): 300 mg via ORAL
  Filled 2018-11-06 (×7): qty 1

## 2018-11-06 MED ORDER — INSULIN ASPART 100 UNIT/ML ~~LOC~~ SOLN
0.0000 [IU] | Freq: Three times a day (TID) | SUBCUTANEOUS | Status: DC
  Administered 2018-11-06: 1 [IU] via SUBCUTANEOUS
  Administered 2018-11-06: 2 [IU] via SUBCUTANEOUS
  Administered 2018-11-06 – 2018-11-07 (×2): 1 [IU] via SUBCUTANEOUS
  Administered 2018-11-07 – 2018-11-09 (×8): 2 [IU] via SUBCUTANEOUS
  Filled 2018-11-06: qty 0.09

## 2018-11-06 MED ORDER — VANCOMYCIN HCL IN DEXTROSE 1-5 GM/200ML-% IV SOLN
1000.0000 mg | Freq: Once | INTRAVENOUS | Status: AC
  Administered 2018-11-06: 1000 mg via INTRAVENOUS
  Filled 2018-11-06: qty 200

## 2018-11-06 MED ORDER — ATORVASTATIN CALCIUM 10 MG PO TABS
20.0000 mg | ORAL_TABLET | Freq: Every evening | ORAL | Status: DC
  Administered 2018-11-06 – 2018-11-09 (×4): 20 mg via ORAL
  Filled 2018-11-06 (×4): qty 2

## 2018-11-06 MED ORDER — TRAMADOL HCL 50 MG PO TABS
50.0000 mg | ORAL_TABLET | Freq: Four times a day (QID) | ORAL | Status: DC
  Administered 2018-11-06 – 2018-11-09 (×10): 50 mg via ORAL
  Filled 2018-11-06 (×11): qty 1

## 2018-11-06 MED ORDER — INSULIN ASPART 100 UNIT/ML ~~LOC~~ SOLN
0.0000 [IU] | Freq: Every day | SUBCUTANEOUS | Status: DC
  Filled 2018-11-06: qty 0.05

## 2018-11-06 MED ORDER — VITAMIN C 500 MG PO TABS
500.0000 mg | ORAL_TABLET | Freq: Every day | ORAL | Status: DC
  Administered 2018-11-06 – 2018-11-09 (×4): 500 mg via ORAL
  Filled 2018-11-06 (×4): qty 1
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
## 2018-11-06 NOTE — Progress Notes (Signed)
PROGRESS NOTE    [name]   YIR:[id number]  DOB: 1931/12/24  DOA: 11/05/2018 PCP: [name], MD   Brief Narrative:  [name] is a 85 y.o. male, w hypertension, hyperlipidemia, Dm2, Pafib, CAD , Iron deficiency anemia,  hx of primary hepatic non-Hodgkins lymphoma (liver biopsy 2006), retroperitoneal recurrence 2014, s/p RICE 2015, s/p Rituxan 2015, prostate cancer stage 4, (Gleason9), who presents with fever and c/o redness right foot.  Admitted for cellulitis of the foot. Xray of the foot did not reveal any osteomyelitis.   Subjective: No significant pain in the foot. He is able to walk on it with minor difficult. The swelling continues.     Assessment & Plan:   Principal Problem:   Cellulitis in diabetic foot  - it started in an area of a prior callus and currently seems to have a large blood blister- he does not recall injuring it - I suspect he may have an abscess and possibly osteomyelitis- have contacted Dr Lorin Mercy, orthopedics - cont Vanc and Zosyn   Active Problems: Acute thrombocytopenia - ? If due to infection- follow    Non Hodgkin's lymphoma  - he states he is in remission    Paroxysmal atrial fibrillation     Xarelto is on hold- if he does not require surgery, will need to resume -  he is not on a rate controlling agent     CKD (chronic kidney disease) stage 3, GFR 30-59 ml/min - stable - follow    OSA (obstructive sleep apnea) - will order CPAP    Type II diabetes mellitus, controlled with neuropathy - continue Insulin- holding Glucophage- has last A1c on 09/11/18 was 6.7 - cont Gabapentin  Pancreatic insuficiency - cont Creon     Metastatic prostate cancer - receives Lupron shots as outpt  Time spent in minutes: 35  DVT prophylaxis: SCDs Code Status: Full code Family Communication:  Disposition Plan: f/u on ortho eval Consultants:   ortho Procedures:   none Antimicrobials:  Anti-infectives (From admission, onward)   Start     Dose/Rate Route Frequency Ordered Stop   11/07/18 0600  vancomycin (VANCOCIN) 1,250 mg in sodium chloride 0.9 % 250 mL IVPB     1,250 mg 166.7 mL/hr over 90 Minutes Intravenous Every 36 hours 11/06/18 0438     11/06/18 0800  piperacillin-tazobactam (ZOSYN) IVPB 3.375 g  Status:  Discontinued     3.375 g 12.5 mL/hr over 240 Minutes Intravenous Every 8 hours 11/06/18 0438 11/06/18 0439   11/06/18 0439  piperacillin-tazobactam (ZOSYN) IVPB 3.375 g     3.375 g 12.5 mL/hr over 240 Minutes Intravenous Every 8 hours 11/06/18 0439     11/06/18 0245  vancomycin (VANCOCIN) IVPB 750 mg/150 ml premix     750 mg 150 mL/hr over 60 Minutes Intravenous  Once 11/06/18 0232 11/06/18 0648   11/06/18 0045  vancomycin (VANCOCIN) IVPB 1000 mg/200 mL premix     1,000 mg 200 mL/hr over 60 Minutes Intravenous  Once 11/06/18 0033 11/06/18 0320   11/06/18 0045  piperacillin-tazobactam (ZOSYN) IVPB 3.375 g     3.375 g 100 mL/hr over 30 Minutes Intravenous  Once 11/06/18 0033 11/06/18 0223       Objective: Vitals:   11/05/18 2311 11/06/18 0419 11/06/18 0530 11/06/18 0539  BP: (!) 110/56 (!) 122/57 127/65   Pulse: 68 68 72   Resp: 17 16 18    Temp: 98.4 F (36.9 C)  99.3 F (37.4 C)   TempSrc: Oral  Oral   SpO2: 99% 98% 98%   Weight: 74.8 kg   71.8 kg  Height: 5\' 8"  (1.727 m)   5\' 8"  (1.727 m)    Intake/Output Summary (Last 24 hours) at 11/06/2018 1029 Last data filed at 11/06/2018 0831 Gross per 24 hour  Intake 560 ml  Output 500 ml  Net 60 ml   Filed Weights   11/05/18 2311 11/06/18 0539  Weight: 74.8 kg 71.8 kg    Examination: General exam: Appears comfortable  HEENT: PERRLA, oral mucosa moist, no sclera icterus or thrush Respiratory system: Clear to auscultation. Respiratory effort normal. Cardiovascular system: S1 & S2 heard, RRR.   Gastrointestinal system: Abdomen soft, non-tender, nondistended. Normal bowel sounds. Central nervous system: Alert and oriented. No focal  neurological deficits. Extremities: No cyanosis, clubbing or edema Skin: No rashes or ulcers    Psychiatry:  Mood & affect appropriate.     Data Reviewed: I have personally reviewed following labs and imaging studies  CBC: Recent Labs  Lab 11/05/18 2324 11/06/18 0638  WBC 12.7* 10.1  NEUTROABS 11.5*  --   HGB 9.7* 8.9*  HCT 30.6* 29.3*  MCV 85.7 87.2  PLT 145* 001*   Basic Metabolic Panel: Recent Labs  Lab 11/05/18 2324 11/06/18 0638  NA 133* 136  K 4.2 4.0  CL 100 105  CO2 22 23  GLUCOSE 148* 172*  BUN 36* 31*  CREATININE 1.52* 1.35*  CALCIUM 9.1 9.0   GFR: Estimated Creatinine Clearance: 39.4 mL/min (A) (by C-G formula based on SCr of 1.35 mg/dL (H)). Liver Function Tests: Recent Labs  Lab 11/05/18 2324 11/06/18 0638  AST 47* 38  ALT 38 34  ALKPHOS 66 58  BILITOT 0.4 0.9  PROT 6.9 6.4*  ALBUMIN 4.1 3.8   No results for input(s): LIPASE, AMYLASE in the last 168 hours. No results for input(s): AMMONIA in the last 168 hours. Coagulation Profile: No results for input(s): INR, PROTIME in the last 168 hours. Cardiac Enzymes: No results for input(s): CKTOTAL, CKMB, CKMBINDEX, TROPONINI in the last 168 hours. BNP (last 3 results) No results for input(s): PROBNP in the last 8760 hours. HbA1C: No results for input(s): HGBA1C in the last 72 hours. CBG: No results for input(s): GLUCAP in the last 168 hours. Lipid Profile: No results for input(s): CHOL, HDL, LDLCALC, TRIG, CHOLHDL, LDLDIRECT in the last 72 hours. Thyroid Function Tests: No results for input(s): TSH, T4TOTAL, FREET4, T3FREE, THYROIDAB in the last 72 hours. Anemia Panel: No results for input(s): VITAMINB12, FOLATE, FERRITIN, TIBC, IRON, RETICCTPCT in the last 72 hours. Urine analysis:    Component Value Date/Time   COLORURINE YELLOW 07/04/2018 1000   APPEARANCEUR TURBID (A) 07/04/2018 1000   LABSPEC 1.008 07/04/2018 1000   LABSPEC 1.020 04/24/2015 0846   PHURINE 6.0 07/04/2018 1000    GLUCOSEU >=500 (A) 07/04/2018 1000   GLUCOSEU 250 04/24/2015 0846   HGBUR LARGE (A) 07/04/2018 1000   BILIRUBINUR NEGATIVE 07/04/2018 1000   BILIRUBINUR Negative 04/24/2015 0846   KETONESUR NEGATIVE 07/04/2018 1000   PROTEINUR 30 (A) 07/04/2018 1000   UROBILINOGEN 0.2 04/24/2015 0846   NITRITE NEGATIVE 07/04/2018 1000   LEUKOCYTESUR LARGE (A) 07/04/2018 1000   LEUKOCYTESUR Negative 04/24/2015 0846   Sepsis Labs: @LABRCNTIP (procalcitonin:4,lacticidven:4) ) Recent Results (from the past 240 hour(s))  SARS Coronavirus 2 (CEPHEID - Performed in [place] hospital lab), Hosp Order     Status: None   Collection Time: 11/06/18  1:14 AM   Specimen: Nasopharyngeal Swab  Result Value  Ref Range Status   SARS Coronavirus 2 NEGATIVE NEGATIVE Final    Comment: (NOTE) If result is NEGATIVE SARS-CoV-2 target nucleic acids are NOT DETECTED. The SARS-CoV-2 RNA is generally detectable in upper and lower  respiratory specimens during the acute phase of infection. The lowest  concentration of SARS-CoV-2 viral copies this assay can detect is 250  copies / mL. A negative result does not preclude SARS-CoV-2 infection  and should not be used as the sole basis for treatment or other  patient management decisions.  A negative result may occur with  improper specimen collection / handling, submission of specimen other  than nasopharyngeal swab, presence of viral mutation(s) within the  areas targeted by this assay, and inadequate number of viral copies  (<250 copies / mL). A negative result must be combined with clinical  observations, patient history, and epidemiological information. If result is POSITIVE SARS-CoV-2 target nucleic acids are DETECTED. The SARS-CoV-2 RNA is generally detectable in upper and lower  respiratory specimens dur ing the acute phase of infection.  Positive  results are indicative of active infection with SARS-CoV-2.  Clinical  correlation with patient history and other  diagnostic information is  necessary to determine patient infection status.  Positive results do  not rule out bacterial infection or co-infection with other viruses. If result is PRESUMPTIVE POSTIVE SARS-CoV-2 nucleic acids MAY BE PRESENT.   A presumptive positive result was obtained on the submitted specimen  and confirmed on repeat testing.  While 2019 novel coronavirus  (SARS-CoV-2) nucleic acids may be present in the submitted sample  additional confirmatory testing may be necessary for epidemiological  and / or clinical management purposes  to differentiate between  SARS-CoV-2 and other Sarbecovirus currently known to infect humans.  If clinically indicated additional testing with an alternate test  methodology [phone]) is advised. The SARS-CoV-2 RNA is generally  detectable in upper and lower respiratory sp ecimens during the acute  phase of infection. The expected result is Negative. Fact Sheet for Patients:  StrictlyIdeas.no Fact Sheet for Healthcare Providers: BankingDealers.co.za This test is not yet approved or cleared by the [place] FDA and has been authorized for detection and/or diagnosis of SARS-CoV-2 by FDA under an Emergency Use Authorization (EUA).  This EUA will remain in effect (meaning this test can be used) for the duration of the COVID-19 declaration under Section 564(b)(1) of the Act, 21 U.S.C. section 360bbb-3(b)(1), unless the authorization is terminated or revoked sooner. Performed at [facility], [place] [address], [place], [place] [address]          Radiology Studies: Dg Foot Complete Right  Result Date: 11/06/2018 CLINICAL DATA:  Painful blister and ulcer medial side of the foot EXAM: RIGHT FOOT COMPLETE - 3+ VIEW COMPARISON:  CT 03/04/2013 FINDINGS: No acute displaced fracture. Mild medial subluxation base of second proximal phalanx, appears chronic. Mild degenerative change at  the first MTP joint. No erosions or bone destruction. Suspected ulcer medial side of foot near base of the metatarsal. Moderate to marked intertarsal degenerative change. Prominent dorsal osteophytes. Vascular calcifications. No soft tissue emphysema. Pes planus deformity. IMPRESSION: No acute osseous abnormality. Prominent intertarsal degenerative changes Electronically Signed   By: Donavan Foil M.D.   On: 11/06/2018 00:13      Scheduled Meds: . allopurinol  300 mg Oral QPC breakfast  . atorvastatin  20 mg Oral QPM  . fenofibrate  160 mg Oral QPM  . gabapentin  300 mg Oral BID  . insulin  aspart  0-5 Units Subcutaneous QHS  . insulin aspart  0-9 Units Subcutaneous TID WC  . lipase/protease/amylase  12,000 Units Oral TID WC  . [START ON 11/07/2018] One-A-Day Mens 50+ Advantage  1 tablet Oral Q breakfast  . sertraline  100 mg Oral QPC breakfast  . Vitamin B-12  2,500 mcg Sublingual q morning - 10a  . vitamin C  500 mg Oral QPC breakfast   Continuous Infusions: . piperacillin-tazobactam (ZOSYN)  IV 3.375 g (11/06/18 0650)  . [START ON 11/07/2018] vancomycin       LOS: 0 days      Debbe Odea, MD [facility] Pager: [email or web address] Password [facility] 11/06/2018, 10:29 AM
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
## 2018-11-06 NOTE — Progress Notes (Signed)
PHARMACY - PHYSICIAN COMMUNICATION CRITICAL VALUE ALERT - BLOOD CULTURE IDENTIFICATION (BCID)  [name] is an 85 y.o. male who presented to [facility] on 11/05/2018 with a chief complaint of RLE cellulitis  Assessment:  Diabetic foot infection negative for osteomyelitis.   1/4 + CoNS.  Possibly contaminant.  Pt already on broad-spectrum antibiotics.  Await final culture results.   Name of physician (or Provider) Contacted: Tylene Fantasia  Current antibiotics: Zosyn + Vancomycin  Changes to prescribed antibiotics recommended:  Patient is on recommended antibiotics - No changes needed  Results for orders placed or performed during the hospital encounter of 11/05/18  Blood Culture ID Panel (Reflexed) (Collected: 11/05/2018 11:29 PM)  Result Value Ref Range   Enterococcus species NOT DETECTED NOT DETECTED   Listeria monocytogenes NOT DETECTED NOT DETECTED   Staphylococcus species DETECTED (A) NOT DETECTED   Staphylococcus aureus (BCID) NOT DETECTED NOT DETECTED   Methicillin resistance NOT DETECTED NOT DETECTED   Streptococcus species NOT DETECTED NOT DETECTED   Streptococcus agalactiae NOT DETECTED NOT DETECTED   Streptococcus pneumoniae NOT DETECTED NOT DETECTED   Streptococcus pyogenes NOT DETECTED NOT DETECTED   Acinetobacter baumannii NOT DETECTED NOT DETECTED   Enterobacteriaceae species NOT DETECTED NOT DETECTED   Enterobacter cloacae complex NOT DETECTED NOT DETECTED   Escherichia coli NOT DETECTED NOT DETECTED   Klebsiella oxytoca NOT DETECTED NOT DETECTED   Klebsiella pneumoniae NOT DETECTED NOT DETECTED   Proteus species NOT DETECTED NOT DETECTED   Serratia marcescens NOT DETECTED NOT DETECTED   Haemophilus influenzae NOT DETECTED NOT DETECTED   Neisseria meningitidis NOT DETECTED NOT DETECTED   Pseudomonas aeruginosa NOT DETECTED NOT DETECTED   Candida albicans NOT DETECTED NOT DETECTED   Candida glabrata NOT DETECTED NOT DETECTED   Candida krusei NOT DETECTED NOT  DETECTED   Candida parapsilosis NOT DETECTED NOT DETECTED   Candida tropicalis NOT DETECTED NOT DETECTED    Biagio Borg 11/06/2018  9:13 PM

## 2018-11-06 NOTE — Progress Notes (Addendum)
HEMATOLOGY-ONCOLOGY PROGRESS NOTE  SUBJECTIVE: [name] was admitted with cellulitis of his right foot. He reported fevers and chills at home. Afebrile since admission. Does not recall any injury to his foot. States blood sugars have been up and down at home. Reports high CBGs tend to run in the 150's.   REVIEW OF SYSTEMS:   All other systems were reviewed with the patient and are negative.  I have reviewed the past medical history, past surgical history, social history and family history with the patient and they are unchanged from previous note.   PHYSICAL EXAMINATION:  Vitals:   11/06/18 0419 11/06/18 0530  BP: (!) 122/57 127/65  Pulse: 68 72  Resp: 16 18  Temp:  99.3 F (37.4 C)  SpO2: 98% 98%   Filed Weights   11/05/18 2311 11/06/18 0539  Weight: 165 lb (74.8 kg) 158 lb 4.6 oz (71.8 kg)    Intake/Output from previous day: 07/13 0701 - 07/14 0700 In: 200 [IV Piggyback:200] Out: 500 [Urine:500]  GENERAL:alert, no distress and comfortable SKIN: Blister on the inner portion right foot about 1x 2.5cm oval (se photo below) Callus next to blister. Redness extending from blister towards dorsum of the right foot and towards ankle LUNGS: clear to auscultation and percussion with normal breathing effort HEART: regular rate & rhythm and 2/6 SEM  Musculoskeletal:no cyanosis of digits and no clubbing  NEURO: alert & oriented x 3 with fluent speech, no focal motor/sensory deficits     LABORATORY DATA:  I have reviewed the data as listed CMP Latest Ref Rng & Units 11/06/2018 11/05/2018 10/04/2018  Glucose 70 - 99 mg/dL 172(H) 148(H) 185(H)  BUN 8 - 23 mg/dL 31(H) 36(H) 27(H)  Creatinine 0.61 - 1.24 mg/dL 1.35(H) 1.52(H) 1.72(H)  Sodium 135 - 145 mmol/L 136 133(L) 140  Potassium 3.5 - 5.1 mmol/L 4.0 4.2 4.1  Chloride 98 - 111 mmol/L 105 100 107  CO2 22 - 32 mmol/L 23 22 22   Calcium 8.9 - 10.3 mg/dL 9.0 9.1 8.9  Total Protein 6.5 - 8.1 g/dL 6.4(L) 6.9 6.5  Total Bilirubin 0.3 - 1.2  mg/dL 0.9 0.4 0.4  Alkaline Phos 38 - 126 U/L 58 66 73  AST 15 - 41 U/L 38 47(H) 47(H)  ALT 0 - 44 U/L 34 38 35    Lab Results  Component Value Date   WBC 10.1 11/06/2018   HGB 8.9 (L) 11/06/2018   HCT 29.3 (L) 11/06/2018   MCV 87.2 11/06/2018   PLT 126 (L) 11/06/2018   NEUTROABS 11.5 (H) 11/05/2018    Dg Foot Complete Right  Result Date: 11/06/2018 CLINICAL DATA:  Painful blister and ulcer medial side of the foot EXAM: RIGHT FOOT COMPLETE - 3+ VIEW COMPARISON:  CT 03/04/2013 FINDINGS: No acute displaced fracture. Mild medial subluxation base of second proximal phalanx, appears chronic. Mild degenerative change at the first MTP joint. No erosions or bone destruction. Suspected ulcer medial side of foot near base of the metatarsal. Moderate to marked intertarsal degenerative change. Prominent dorsal osteophytes. Vascular calcifications. No soft tissue emphysema. Pes planus deformity. IMPRESSION: No acute osseous abnormality. Prominent intertarsal degenerative changes Electronically Signed   By: Donavan Foil M.D.   On: 11/06/2018 00:13    ASSESSMENT: 85 y.o. patient with a diagnosis of    #1 Primary hepatic non-Hodgkin's lymphoma established through liver biopsy February 2006, treated with Rituxan x9 and then CHOP x6.  All chemotherapy completed in 2007.  Maintenance Rituxan discontinued October 2008.     #  2  Biopsy proven retroperitoneal recurrence documented November 2014. Bone marrow biopsy 04/01/2013 was negative   #3 status post  laparoscopic cholecystectomy and port placement on 04/04/2013.  #4 Cycle #1  RICE chemotherapy  completed on 05/02/2013.   #5 cycle #2 RICE chemotherapy  on 05/17/2013  #6 PET scan performed on 05/23/2013 revealed marked partial response to chemotherapy with a retroperitoneal mass decrease in metabolic activity from SUV of 13.2 to 6.3. Right adrenal mass size and metabolic activity is resolved when compared to the previous PET scan.   #7  Evaluated at [facility] by Dr. Tomasa Hosteller for autologous transplant on 05/27/2013 and was quoted a 3% chance of significant heart damage and possibly death from the treatment and a 50% chance of being alive 5 years from now after transplant (versus perhaps 2 years without). After much thought the patient and family have decided firmly they do not wish to proceed to stem cell transplant.  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 treated with vancomycin IV x14 days completed 07/02/2013, followed by doxycycline for 2 additional weeks  #10 started maintenance Rituxan May 2015 (1 dose every 8 weeks)  #11 A-fib, on rivaroxaban  #12 prostate cancer, stage IV-- per Dr [name] (a) obstructive uropathy secondary to bladder mass (Gleason 9), s/p  L stenting 02/05/2015, exchanged PRN  #13 PET scan 04/29/2016 shows continuing evidence of response, with no significant abnormal hypermetabolic activity to indicate recurrent or active disease (a) repeat PET scan 02/13/2017 shows a continuing metabolic response  #76 iron deficiency anemia: Status post Feraheme  #15: High fall risk  #16: Right foot cellulitis   PLAN: [name] is almost 15 years from initial diagnosis of primary hepatic non-Hodgkin's lymphoma and 5 years out from his most recent chemotherapy, with no evidence of active disease.  We have been treating him with rituximab every 2 months and plan to continue this indefinitely until there is evidence of disease progression.  He currently has no "B" symptoms.  CBC has been reviewed and he continues to have mild anemia and thrombocytopenia.  This is overall consistent with his baseline.  Recommend continued monitoring.   The patient is currently admitted with right foot cellulitis.  Blood cultures are pending.  He has been afebrile since admission.  Continue IV antibiotics per hospitalist.   LOS: 0 days   Mikey Bussing, DNP,  AGPCNP-BC, AOCNP 11/06/18    ADDENDUM: "[name]" feels well and is recieving excellent treatment from the hospitalist sevice. His next maintenance rituximab treatment is not until 11/29/2018.  I have added a visit that day to make sure the current problem has sufficiently resolved before proceeding to treatment.  I personally saw this patient and performed a substantive portion of this encounter with the listed APP documented above.   [name], MD Medical Oncology and Hematology [facility] [address] [place], [place] [address] Tel. [phone]    Fax. [phone]
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
## 2018-11-06 NOTE — Progress Notes (Signed)
Pharmacy Antibiotic Note  [name] is a 85 y.o. male admitted on 11/05/2018 with cellulitis.  Pharmacy has been consulted for Vancomycin and Zosyn dosing.  Plan: Zosyn 3.375g IV q8h (4 hour infusion).   Vancomycin 1.75gm iv x1, then Vancomycin 1250 mg IV Q 36 hrs. Goal AUC 400-550. Expected AUC: 462.6 SCr used: 1.52   Height: 5\' 8"  (172.7 cm) Weight: 165 lb (74.8 kg) IBW/kg (Calculated) : 68.4  Temp (24hrs), Avg:98.4 F (36.9 C), Min:98.4 F (36.9 C), Max:98.4 F (36.9 C)  Recent Labs  Lab 11/05/18 2324  WBC 12.7*  CREATININE 1.52*    Estimated Creatinine Clearance: 35 mL/min (A) (by C-G formula based on SCr of 1.52 mg/dL (H)).    Allergies  Allergen Reactions  . Atenolol Other (See Comments)    "Heart rate slowed- STOPPED on 08/16/2013"  . Niacin Other (See Comments)    headaches    Antimicrobials this admission: Vancomycin 11/06/2018 >> Zosyn 11/06/2018 >>   Dose adjustments this admission: -  Microbiology results: -  Thank you for allowing pharmacy to be a part of this patient's care.  Nani Skillern [place] 11/06/2018 4:40 AM

## 2018-11-07 DIAGNOSIS — E11628 Type 2 diabetes mellitus with other skin complications: Principal | ICD-10-CM

## 2018-11-07 DIAGNOSIS — I10 Essential (primary) hypertension: Secondary | ICD-10-CM

## 2018-11-07 DIAGNOSIS — L03119 Cellulitis of unspecified part of limb: Secondary | ICD-10-CM

## 2018-11-07 DIAGNOSIS — N183 Chronic kidney disease, stage 3 (moderate): Secondary | ICD-10-CM

## 2018-11-07 LAB — BASIC METABOLIC PANEL
Anion gap: 9 (ref 5–15)
BUN: 30 mg/dL — ABNORMAL HIGH (ref 8–23)
CO2: 23 mmol/L (ref 22–32)
Calcium: 9.2 mg/dL (ref 8.9–10.3)
Chloride: 105 mmol/L (ref 98–111)
Creatinine, Ser: 1.36 mg/dL — ABNORMAL HIGH (ref 0.61–1.24)
GFR calc Af Amer: 55 mL/min — ABNORMAL LOW (ref >60)
GFR calc non Af Amer: 47 mL/min — ABNORMAL LOW (ref >60)
Glucose, Bld: 131 mg/dL — ABNORMAL HIGH (ref 70–99)
Potassium: 4.6 mmol/L (ref 3.5–5.1)
Sodium: 137 mmol/L (ref 135–145)

## 2018-11-07 LAB — GLUCOSE, CAPILLARY
Glucose-Capillary: 148 mg/dL — ABNORMAL HIGH (ref 70–99)
Glucose-Capillary: 164 mg/dL — ABNORMAL HIGH (ref 70–99)
Glucose-Capillary: 138 mg/dL — ABNORMAL HIGH (ref 70–99)
Glucose-Capillary: 156 mg/dL — ABNORMAL HIGH (ref 70–99)
Glucose-Capillary: 130 mg/dL — ABNORMAL HIGH (ref 70–99)
Glucose-Capillary: 183 mg/dL — ABNORMAL HIGH (ref 70–99)
Glucose-Capillary: 166 mg/dL — ABNORMAL HIGH (ref 70–99)
Glucose-Capillary: 176 mg/dL — ABNORMAL HIGH (ref 70–99)

## 2018-11-07 MED ORDER — RIVAROXABAN 15 MG PO TABS
15.0000 mg | ORAL_TABLET | Freq: Every day | ORAL | Status: DC
  Administered 2018-11-07 – 2018-11-09 (×3): 15 mg via ORAL
  Filled 2018-11-07 (×3): qty 1

## 2018-11-07 MED ORDER — SODIUM CHLORIDE 0.9% FLUSH: 10.0000 mL | INTRAVENOUS | Status: DC | PRN

## 2018-11-07 MED ORDER — SODIUM CHLORIDE 0.9% FLUSH
10.0000 mL | Freq: Two times a day (BID) | INTRAVENOUS | Status: DC
  Administered 2018-11-07: 10 mL

## 2018-11-07 NOTE — Progress Notes (Signed)
PROGRESS NOTE    [name]  MBW:[id number] DOB: 07-29-1931 DOA: 11/05/2018 PCP: [name], MD    Brief Narrative:   [name]  is a 85 y.o. male, w hypertension, hyperlipidemia, Dm2, Pafib, CAD , Iron deficiency anemia,  hx of primary hepatic non-Hodgkins lymphoma (liver biopsy 2006), retroperitoneal recurrence 2014, s/p RICE 2015, s/p Rituxan 2015, prostate cancer stage 4, (Gleason9), who presents with fever and c/o redness right foot,  Pt states fever started yesterday.  Redness might have started 1-2 days ago.  Blister 1-2 days ago.  pt was admitted for cellulitis of the right foot with abscess.   Assessment & Plan:   Principal Problem:   Cellulitis in diabetic foot ([place]) Active Problems:   Non Hodgkin's lymphoma (HCC)   Paroxysmal atrial fibrillation (HCC)   Fever   CKD (chronic kidney disease) stage 3, GFR 30-59 ml/min (HCC)   Essential hypertension   OSA (obstructive sleep apnea)   Iron deficiency anemia   Type II diabetes mellitus (HCC)   Essential hypertension   Cellulitis   Pressure injury of skin   RIGHT FOOT cellulitis with foot blister:  S/p Incisionand drainage at bedside and fluid sent for analysis and culture.  MRI of the foot done and was negative for osteomyelitis.  Continue with broad spectrum IV antibiotics till the cultures are back.    Stage 3 CKD:  Creatinine improved.    Hypertension:  Well controlled.    Type 2 DM; CBG (last 3)  Recent Labs    11/07/18 0735 11/07/18 1135 11/07/18 1623  GLUCAP 130* 183* 166*   Resume SSI.   PAF: Rate controlled.  On xarelto for anti coagulation.     Mild thrombocytopenia:  No bleeding episodes.  Recheck in am.     DVT prophylaxis: xarelto Code Status: DNR Family Communication: discussed with daughter over the phone.  Disposition Plan: POSSIBLE d/c in 1 to 2 days. , waiting for cultures to come back.    Consultants:   Dr Lorin Mercy with Orthopedics.    Procedures:I&D at  bedside by orthopedics on 7/14   Antimicrobials:vancomycinand zosyn.    Subjective: Pain well controlled.   Objective: Vitals:   11/06/18 2025 11/06/18 2057 11/07/18 0516 11/07/18 1319  BP:  (!) 105/57 (!) 104/59 (!) 97/57  Pulse: 64 67 61 (!) 59  Resp: 16 18 18 18   Temp:  97.6 F (36.4 C) 98.3 F (36.8 C) 98.3 F (36.8 C)  TempSrc:  Oral Oral Oral  SpO2: 94% 96% 92% 95%  Weight:      Height:        Intake/Output Summary (Last 24 hours) at 11/07/2018 1637 Last data filed at 11/07/2018 1500 Gross per 24 hour  Intake 944.58 ml  Output 1250 ml  Net -305.42 ml   Filed Weights   11/05/18 2311 11/06/18 0539  Weight: 74.8 kg 71.8 kg    Examination:  General exam: Appears calm and comfortable  Respiratory system: Clear to auscultation. Respiratory effort normal. Cardiovascular system: S1 & S2 heard, RRR. No JVD,  Gastrointestinal system: Abdomen is nondistended, soft and nontender. No organomegaly or masses felt. Normal bowel sounds heard. Central nervous system: Alert and oriented. No focal neurological deficits. Extremities: right lower extremity wound bandaged. Pedal edema present.  Skin: No rashes, lesions or ulcers Psychiatry: Mood & affect appropriate.     Data Reviewed: I have personally reviewed following labs and imaging studies  CBC: Recent Labs  Lab 11/05/18 2324 11/06/18 0638  WBC 12.7* 10.1  NEUTROABS 11.5*  --   HGB 9.7* 8.9*  HCT 30.6* 29.3*  MCV 85.7 87.2  PLT 145* 981*   Basic Metabolic Panel: Recent Labs  Lab 11/05/18 2324 11/06/18 0638 11/07/18 0601  NA 133* 136 137  K 4.2 4.0 4.6  CL 100 105 105  CO2 22 23 23   GLUCOSE 148* 172* 131*  BUN 36* 31* 30*  CREATININE 1.52* 1.35* 1.36*  CALCIUM 9.1 9.0 9.2   GFR: Estimated Creatinine Clearance: 39.1 mL/min (A) (by C-G formula based on SCr of 1.36 mg/dL (H)). Liver Function Tests: Recent Labs  Lab 11/05/18 2324 11/06/18 0638  AST 47* 38  ALT 38 34  ALKPHOS 66 58  BILITOT 0.4  0.9  PROT 6.9 6.4*  ALBUMIN 4.1 3.8   No results for input(s): LIPASE, AMYLASE in the last 168 hours. No results for input(s): AMMONIA in the last 168 hours. Coagulation Profile: No results for input(s): INR, PROTIME in the last 168 hours. Cardiac Enzymes: No results for input(s): CKTOTAL, CKMB, CKMBINDEX, TROPONINI in the last 168 hours. BNP (last 3 results) No results for input(s): PROBNP in the last 8760 hours. HbA1C: No results for input(s): HGBA1C in the last 72 hours. CBG: Recent Labs  Lab 11/06/18 1614 11/06/18 2131 11/07/18 0735 11/07/18 1135 11/07/18 1623  GLUCAP 138* 156* 130* 183* 166*   Lipid Profile: No results for input(s): CHOL, HDL, LDLCALC, TRIG, CHOLHDL, LDLDIRECT in the last 72 hours. Thyroid Function Tests: No results for input(s): TSH, T4TOTAL, FREET4, T3FREE, THYROIDAB in the last 72 hours. Anemia Panel: No results for input(s): VITAMINB12, FOLATE, FERRITIN, TIBC, IRON, RETICCTPCT in the last 72 hours. Sepsis Labs: No results for input(s): PROCALCITON, LATICACIDVEN in the last 168 hours.  Recent Results (from the past 240 hour(s))  Blood culture (routine x 2)     Status: None (Preliminary result)   Collection Time: 11/05/18 11:24 PM   Specimen: BLOOD  Result Value Ref Range Status   Specimen Description   Final    BLOOD LEFT ANTECUBITAL Performed at [facility] [address], [place], [place] [address]    Special Requests   Final    BOTTLES DRAWN AEROBIC AND ANAEROBIC Blood Culture results may not be optimal due to an excessive volume of blood received in culture bottles Performed at [facility] [address] [place], [place] [address]    Culture   Final    NO GROWTH 1 DAY Performed at [facility], [place] [address] [place], [place] [address]    Report Status PENDING  Incomplete  Blood culture (routine x 2)     Status: Abnormal (Preliminary result)   Collection Time: 11/05/18 11:29 PM    Specimen: BLOOD  Result Value Ref Range Status   Specimen Description   Final    BLOOD RIGHT ANTECUBITAL Performed at [facility] [address] [place], [place] [address]    Special Requests   Final    BOTTLES DRAWN AEROBIC AND ANAEROBIC Blood Culture adequate volume Performed at [facility] [address] [place], [place] [address]    Culture  Setup Time   Final    IN BOTH AEROBIC AND ANAEROBIC BOTTLES GRAM POSITIVE COCCI CRITICAL RESULT CALLED TO, READ BACK BY AND VERIFIED WITH: Sheffield Slider [facility] 11/06/18 2053 JDW    Culture (A)  Final    STAPHYLOCOCCUS SPECIES (COAGULASE NEGATIVE) THE SIGNIFICANCE OF ISOLATING THIS ORGANISM FROM A SINGLE SET OF BLOOD CULTURES WHEN MULTIPLE  SETS ARE DRAWN IS UNCERTAIN. PLEASE NOTIFY THE MICROBIOLOGY DEPARTMENT WITHIN ONE WEEK IF SPECIATION AND SENSITIVITIES ARE REQUIRED. Performed at [facility], [place] [address] [place], [place] [address]    Report Status PENDING  Incomplete  Blood Culture ID Panel (Reflexed)     Status: Abnormal   Collection Time: 11/05/18 11:29 PM  Result Value Ref Range Status   Enterococcus species NOT DETECTED NOT DETECTED Final   Listeria monocytogenes NOT DETECTED NOT DETECTED Final   Staphylococcus species DETECTED (A) NOT DETECTED Final    Comment: Methicillin (oxacillin) susceptible coagulase negative staphylococcus. Possible blood culture contaminant (unless isolated from more than one blood culture draw or clinical case suggests pathogenicity). No antibiotic treatment is indicated for blood  culture contaminants. CRITICAL RESULT CALLED TO, READ BACK BY AND VERIFIED WITH: Sheffield Slider [facility] 11/06/18 2051 JDW    Staphylococcus aureus (BCID) NOT DETECTED NOT DETECTED Final   Methicillin resistance NOT DETECTED NOT DETECTED Final   Streptococcus species NOT DETECTED NOT DETECTED Final   Streptococcus agalactiae NOT DETECTED NOT DETECTED Final   Streptococcus pneumoniae  NOT DETECTED NOT DETECTED Final   Streptococcus pyogenes NOT DETECTED NOT DETECTED Final   Acinetobacter baumannii NOT DETECTED NOT DETECTED Final   Enterobacteriaceae species NOT DETECTED NOT DETECTED Final   Enterobacter cloacae complex NOT DETECTED NOT DETECTED Final   Escherichia coli NOT DETECTED NOT DETECTED Final   Klebsiella oxytoca NOT DETECTED NOT DETECTED Final   Klebsiella pneumoniae NOT DETECTED NOT DETECTED Final   Proteus species NOT DETECTED NOT DETECTED Final   Serratia marcescens NOT DETECTED NOT DETECTED Final   Haemophilus influenzae NOT DETECTED NOT DETECTED Final   Neisseria meningitidis NOT DETECTED NOT DETECTED Final   Pseudomonas aeruginosa NOT DETECTED NOT DETECTED Final   Candida albicans NOT DETECTED NOT DETECTED Final   Candida glabrata NOT DETECTED NOT DETECTED Final   Candida krusei NOT DETECTED NOT DETECTED Final   Candida parapsilosis NOT DETECTED NOT DETECTED Final   Candida tropicalis NOT DETECTED NOT DETECTED Final    Comment: Performed at [facility], [address], [place], [place] [address]  SARS Coronavirus 2 (CEPHEID - Performed in [facility] hospital lab), Hosp Order     Status: None   Collection Time: 11/06/18  1:14 AM   Specimen: Nasopharyngeal Swab  Result Value Ref Range Status   SARS Coronavirus 2 NEGATIVE NEGATIVE Final    Comment: (NOTE) If result is NEGATIVE SARS-CoV-2 target nucleic acids are NOT DETECTED. The SARS-CoV-2 RNA is generally detectable in upper and lower  respiratory specimens during the acute phase of infection. The lowest  concentration of SARS-CoV-2 viral copies this assay can detect is 250  copies / mL. A negative result does not preclude SARS-CoV-2 infection  and should not be used as the sole basis for treatment or other  patient management decisions.  A negative result may occur with  improper specimen collection / handling, submission of specimen other  than nasopharyngeal swab, presence of viral  mutation(s) within the  areas targeted by this assay, and inadequate number of viral copies  (<250 copies / mL). A negative result must be combined with clinical  observations, patient history, and epidemiological information. If result is POSITIVE SARS-CoV-2 target nucleic acids are DETECTED. The SARS-CoV-2 RNA is generally detectable in upper and lower  respiratory specimens dur ing the acute phase of infection.  Positive  results are indicative of active infection with SARS-CoV-2.  Clinical  correlation with patient history and  other diagnostic information is  necessary to determine patient infection status.  Positive results do  not rule out bacterial infection or co-infection with other viruses. If result is PRESUMPTIVE POSTIVE SARS-CoV-2 nucleic acids MAY BE PRESENT.   A presumptive positive result was obtained on the submitted specimen  and confirmed on repeat testing.  While 2019 novel coronavirus  (SARS-CoV-2) nucleic acids may be present in the submitted sample  additional confirmatory testing may be necessary for epidemiological  and / or clinical management purposes  to differentiate between  SARS-CoV-2 and other Sarbecovirus currently known to infect humans.  If clinically indicated additional testing with an alternate test  methodology [phone]) is advised. The SARS-CoV-2 RNA is generally  detectable in upper and lower respiratory sp ecimens during the acute  phase of infection. The expected result is Negative. Fact Sheet for Patients:  StrictlyIdeas.no Fact Sheet for Healthcare Providers: BankingDealers.co.za This test is not yet approved or cleared by the [place] FDA and has been authorized for detection and/or diagnosis of SARS-CoV-2 by FDA under an Emergency Use Authorization (EUA).  This EUA will remain in effect (meaning this test can be used) for the duration of the COVID-19 declaration under Section 564(b)(1)  of the Act, 21 U.S.C. section 360bbb-3(b)(1), unless the authorization is terminated or revoked sooner. Performed at [facility], [place] [address] [place], [place] [address]   Aerobic Culture (superficial specimen)     Status: None (Preliminary result)   Collection Time: 11/06/18  4:09 PM   Specimen: Foot  Result Value Ref Range Status   Specimen Description   Final    FOOT RIGHT Performed at [facility] [address], [place], [place] [address]    Special Requests   Final    NONE Performed at [facility], [place] [address], [place], [place] [address]    Gram Stain   Final    MODERATE WBC PRESENT, PREDOMINANTLY PMN ABUNDANT GRAM POSITIVE COCCI IN PAIRS Gram Stain Report Called to,Read Back By and Verified With: Jaymes Graff [phone] @ 8453 BY Martina Sinner Performed at [facility] [address], [place],  [address]    Culture   Final    CULTURE REINCUBATED FOR BETTER GROWTH Performed at [facility], [place] [address] [place],  [address]    Report Status PENDING  Incomplete         Radiology Studies: Dg Foot Complete Right  Result Date: 11/06/2018 CLINICAL DATA:  Painful blister and ulcer medial side of the foot EXAM: RIGHT FOOT COMPLETE - 3+ VIEW COMPARISON:  CT 03/04/2013 FINDINGS: No acute displaced fracture. Mild medial subluxation base of second proximal phalanx, appears chronic. Mild degenerative change at the first MTP joint. No erosions or bone destruction. Suspected ulcer medial side of foot near base of the metatarsal. Moderate to marked intertarsal degenerative change. Prominent dorsal osteophytes. Vascular calcifications. No soft tissue emphysema. Pes planus deformity. IMPRESSION: No acute osseous abnormality. Prominent intertarsal degenerative changes Electronically Signed   By: Donavan Foil M.D.   On: 11/06/2018 00:13        Scheduled Meds: . allopurinol  300 mg Oral  QPC breakfast  . atorvastatin  20 mg Oral QPM  . fenofibrate  160 mg Oral QPM  . gabapentin  300 mg Oral BID  . insulin aspart  0-5 Units Subcutaneous QHS  . insulin aspart  0-9 Units Subcutaneous TID WC  . lipase/protease/amylase  12,000 Units Oral TID WC  .  multivitamin with minerals  1 tablet Oral Daily  . rivaroxaban  15 mg Oral Q supper  . sertraline  100 mg Oral QPC breakfast  . sodium chloride flush  10-40 mL Intracatheter Q12H  . traMADol  50 mg Oral Q6H  . vitamin B-12  2,500 mcg Oral Daily  . vitamin C  500 mg Oral QPC breakfast   Continuous Infusions: . piperacillin-tazobactam (ZOSYN)  IV 12.5 mL/hr at 11/07/18 1500  . vancomycin Stopped (11/07/18 1330)     LOS: 1 day    Time spent: 35 minutes    Hosie Poisson, MD [facility] Pager [phone]  If 7PM-7AM, please contact night-coverage [email or web address] Password [facility] 11/07/2018, 4:37 PM

## 2018-11-08 LAB — BASIC METABOLIC PANEL
Anion gap: 9 (ref 5–15)
BUN: 31 mg/dL — ABNORMAL HIGH (ref 8–23)
CO2: 23 mmol/L (ref 22–32)
Calcium: 9.2 mg/dL (ref 8.9–10.3)
Chloride: 100 mmol/L (ref 98–111)
Creatinine, Ser: 1.38 mg/dL — ABNORMAL HIGH (ref 0.61–1.24)
GFR calc Af Amer: 54 mL/min — ABNORMAL LOW (ref >60)
GFR calc non Af Amer: 47 mL/min — ABNORMAL LOW (ref >60)
Glucose, Bld: 167 mg/dL — ABNORMAL HIGH (ref 70–99)
Potassium: 4.7 mmol/L (ref 3.5–5.1)
Sodium: 132 mmol/L — ABNORMAL LOW (ref 135–145)

## 2018-11-08 LAB — GLUCOSE, CAPILLARY
Glucose-Capillary: 156 mg/dL — ABNORMAL HIGH (ref 70–99)
Glucose-Capillary: 164 mg/dL — ABNORMAL HIGH (ref 70–99)
Glucose-Capillary: 169 mg/dL — ABNORMAL HIGH (ref 70–99)
Glucose-Capillary: 161 mg/dL — ABNORMAL HIGH (ref 70–99)

## 2018-11-08 LAB — CBC
HCT: 32.8 % — ABNORMAL LOW (ref 39.0–52.0)
Hemoglobin: 10 g/dL — ABNORMAL LOW (ref 13.0–17.0)
MCH: 26.5 pg (ref 26.0–34.0)
MCHC: 30.5 g/dL (ref 30.0–36.0)
MCV: 87 fL (ref 80.0–100.0)
Platelets: 164 10*3/uL (ref 150–400)
RBC: 3.77 MIL/uL — ABNORMAL LOW (ref 4.22–5.81)
RDW: 16.3 % — ABNORMAL HIGH (ref 11.5–15.5)
WBC: 7.5 10*3/uL (ref 4.0–10.5)
nRBC: 0 % (ref 0.0–0.2)
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
## 2018-11-08 NOTE — Progress Notes (Signed)
PROGRESS NOTE    [name]  GBT:[id number] DOB: [birth year] DOA: 11/05/2018 PCP: [name], MD    Brief Narrative:   [name]  is a 85 y.o. male, w hypertension, hyperlipidemia, Dm2, Pafib, CAD , Iron deficiency anemia,  hx of primary hepatic non-Hodgkins lymphoma (liver biopsy 2006), retroperitoneal recurrence 2014, s/p RICE 2015, s/p Rituxan 2015, prostate cancer stage 4, (Gleason9), who presents with fever and c/o redness right foot,  Pt states fever started yesterday.  Redness might have started 1-2 days ago.  Blister 1-2 days ago.  pt was admitted for cellulitis of the right foot with abscess.   Assessment & Plan:   Principal Problem:   Cellulitis in diabetic foot ([place]) Active Problems:   Non Hodgkin's lymphoma (HCC)   Paroxysmal atrial fibrillation (HCC)   Fever   CKD (chronic kidney disease) stage 3, GFR 30-59 ml/min (HCC)   Essential hypertension   OSA (obstructive sleep apnea)   Iron deficiency anemia   Type II diabetes mellitus (HCC)   Essential hypertension   Cellulitis   Pressure injury of skin   Right foot cellulitis with foot blister:  S/p Incision and drainage at bedside and fluid sent for analysis and culture. It shows gram positive cocci, further identification and sensitivities are pending.  MRI of the foot done and was negative for osteomyelitis.  Continue with broad spectrum IV antibiotics till the cultures are back.    Stage 3 CKD:  Creatinine improved.    Hypertension:  Well controlled.    Type 2 DM:  CBG (last 3)  Recent Labs    11/07/18 1623 11/07/18 2047 11/08/18 0753  GLUCAP 166* 176* 156*   Resume SSI.   PAF: Rate controlled.  On xarelto for anti coagulation.     Mild thrombocytopenia:  No bleeding episodes.  Resolved.     Blood cultures one set growing coag negative staph, suspect its contamination.  Repeat cultures ordered and pending.     DVT prophylaxis: xarelto Code Status: DNR Family  Communication: discussed with daughter over the phone on 7/16 Disposition Plan: waiting for cultures to come back.    Consultants:   Dr Lorin Mercy with Orthopedics.    Procedures: I&D at bedside by orthopedics on 7/14   Antimicrobials: vancomycin and zosyn since admission.    Subjective: Pain well controlled.   Objective: Vitals:   11/07/18 1933 11/07/18 2046 11/08/18 0500 11/08/18 0503  BP:  (!) 104/52  (!) 93/58  Pulse: 62 62  (!) 55  Resp: 17 19  17   Temp:  98.5 F (36.9 C)  98.5 F (36.9 C)  TempSrc:  Oral  Oral  SpO2: 96% 98%  99%  Weight:   70 kg   Height:        Intake/Output Summary (Last 24 hours) at 11/08/2018 1107 Last data filed at 11/08/2018 7106 Gross per 24 hour  Intake 1104.58 ml  Output 550 ml  Net 554.58 ml   Filed Weights   11/05/18 2311 11/06/18 0539 11/08/18 0500  Weight: 74.8 kg 71.8 kg 70 kg    Examination:  General exam: Appears calm and comfortable  Respiratory system: Clear to auscultation. Respiratory effort normal. Cardiovascular system: S1 & S2 heard, RRR. No JVD,  Gastrointestinal system: Abdomen is nondistended, soft and nontender. No organomegaly or masses felt. Normal bowel sounds heard. Central nervous system: Alert and oriented. No focal neurological deficits. Extremities: right lower extremity wound bandaged. Pedal edema present.  Skin: No rashes, lesions or ulcers Psychiatry: Mood &  affect appropriate.     Data Reviewed: I have personally reviewed following labs and imaging studies  CBC: Recent Labs  Lab 11/05/18 2324 11/06/18 0638 11/08/18 0423  WBC 12.7* 10.1 7.5  NEUTROABS 11.5*  --   --   HGB 9.7* 8.9* 10.0*  HCT 30.6* 29.3* 32.8*  MCV 85.7 87.2 87.0  PLT 145* 126* 947   Basic Metabolic Panel: Recent Labs  Lab 11/05/18 2324 11/06/18 0638 11/07/18 0601 11/08/18 0423  NA 133* 136 137 132*  K 4.2 4.0 4.6 4.7  CL 100 105 105 100  CO2 22 23 23 23   GLUCOSE 148* 172* 131* 167*  BUN 36* 31* 30* 31*   CREATININE 1.52* 1.35* 1.36* 1.38*  CALCIUM 9.1 9.0 9.2 9.2   GFR: Estimated Creatinine Clearance: 38.6 mL/min (A) (by C-G formula based on SCr of 1.38 mg/dL (H)). Liver Function Tests: Recent Labs  Lab 11/05/18 2324 11/06/18 0638  AST 47* 38  ALT 38 34  ALKPHOS 66 58  BILITOT 0.4 0.9  PROT 6.9 6.4*  ALBUMIN 4.1 3.8   No results for input(s): LIPASE, AMYLASE in the last 168 hours. No results for input(s): AMMONIA in the last 168 hours. Coagulation Profile: No results for input(s): INR, PROTIME in the last 168 hours. Cardiac Enzymes: No results for input(s): CKTOTAL, CKMB, CKMBINDEX, TROPONINI in the last 168 hours. BNP (last 3 results) No results for input(s): PROBNP in the last 8760 hours. HbA1C: No results for input(s): HGBA1C in the last 72 hours. CBG: Recent Labs  Lab 11/07/18 0735 11/07/18 1135 11/07/18 1623 11/07/18 2047 11/08/18 0753  GLUCAP 130* 183* 166* 176* 156*   Lipid Profile: No results for input(s): CHOL, HDL, LDLCALC, TRIG, CHOLHDL, LDLDIRECT in the last 72 hours. Thyroid Function Tests: No results for input(s): TSH, T4TOTAL, FREET4, T3FREE, THYROIDAB in the last 72 hours. Anemia Panel: No results for input(s): VITAMINB12, FOLATE, FERRITIN, TIBC, IRON, RETICCTPCT in the last 72 hours. Sepsis Labs: No results for input(s): PROCALCITON, LATICACIDVEN in the last 168 hours.  Recent Results (from the past 240 hour(s))  Blood culture (routine x 2)     Status: None (Preliminary result)   Collection Time: 11/05/18 11:24 PM   Specimen: BLOOD  Result Value Ref Range Status   Specimen Description   Final    BLOOD LEFT ANTECUBITAL Performed at [facility] [address], [place], [place] [address]    Special Requests   Final    BOTTLES DRAWN AEROBIC AND ANAEROBIC Blood Culture results may not be optimal due to an excessive volume of blood received in culture bottles Performed at [facility] [address],  [place], [place] [address]    Culture   Final    NO GROWTH 1 DAY Performed at [facility], [place] [address] [place], [place] [address]    Report Status PENDING  Incomplete  Blood culture (routine x 2)     Status: Abnormal   Collection Time: 11/05/18 11:29 PM   Specimen: BLOOD  Result Value Ref Range Status   Specimen Description   Final    BLOOD RIGHT ANTECUBITAL Performed at [facility] [address] [place], [place] [address]    Special Requests   Final    BOTTLES DRAWN AEROBIC AND ANAEROBIC Blood Culture adequate volume Performed at [facility] [address], [place], [place] [address]    Culture  Setup Time   Final    IN BOTH AEROBIC AND  ANAEROBIC BOTTLES GRAM POSITIVE COCCI CRITICAL RESULT CALLED TO, READ BACK BY AND VERIFIED WITH: Sheffield Slider [facility] 11/06/18 2053 JDW    Culture (A)  Final    STAPHYLOCOCCUS SPECIES (COAGULASE NEGATIVE) THE SIGNIFICANCE OF ISOLATING THIS ORGANISM FROM A SINGLE SET OF BLOOD CULTURES WHEN MULTIPLE SETS ARE DRAWN IS UNCERTAIN. PLEASE NOTIFY THE MICROBIOLOGY DEPARTMENT WITHIN ONE WEEK IF SPECIATION AND SENSITIVITIES ARE REQUIRED. Performed at [facility], [place] [address] [place], [place] [address]    Report Status 11/08/2018 FINAL  Final  Blood Culture ID Panel (Reflexed)     Status: Abnormal   Collection Time: 11/05/18 11:29 PM  Result Value Ref Range Status   Enterococcus species NOT DETECTED NOT DETECTED Final   Listeria monocytogenes NOT DETECTED NOT DETECTED Final   Staphylococcus species DETECTED (A) NOT DETECTED Final    Comment: Methicillin (oxacillin) susceptible coagulase negative staphylococcus. Possible blood culture contaminant (unless isolated from more than one blood culture draw or clinical case suggests pathogenicity). No antibiotic treatment is indicated for blood  culture contaminants. CRITICAL RESULT CALLED TO, READ BACK BY AND VERIFIED WITH: Sheffield Slider [facility] 11/06/18 2051  JDW    Staphylococcus aureus (BCID) NOT DETECTED NOT DETECTED Final   Methicillin resistance NOT DETECTED NOT DETECTED Final   Streptococcus species NOT DETECTED NOT DETECTED Final   Streptococcus agalactiae NOT DETECTED NOT DETECTED Final   Streptococcus pneumoniae NOT DETECTED NOT DETECTED Final   Streptococcus pyogenes NOT DETECTED NOT DETECTED Final   Acinetobacter baumannii NOT DETECTED NOT DETECTED Final   Enterobacteriaceae species NOT DETECTED NOT DETECTED Final   Enterobacter cloacae complex NOT DETECTED NOT DETECTED Final   Escherichia coli NOT DETECTED NOT DETECTED Final   Klebsiella oxytoca NOT DETECTED NOT DETECTED Final   Klebsiella pneumoniae NOT DETECTED NOT DETECTED Final   Proteus species NOT DETECTED NOT DETECTED Final   Serratia marcescens NOT DETECTED NOT DETECTED Final   Haemophilus influenzae NOT DETECTED NOT DETECTED Final   Neisseria meningitidis NOT DETECTED NOT DETECTED Final   Pseudomonas aeruginosa NOT DETECTED NOT DETECTED Final   Candida albicans NOT DETECTED NOT DETECTED Final   Candida glabrata NOT DETECTED NOT DETECTED Final   Candida krusei NOT DETECTED NOT DETECTED Final   Candida parapsilosis NOT DETECTED NOT DETECTED Final   Candida tropicalis NOT DETECTED NOT DETECTED Final    Comment: Performed at [facility], [address], [place], [place] [address]  SARS Coronavirus 2 (CEPHEID - Performed in [facility] hospital lab), Hosp Order     Status: None   Collection Time: 11/06/18  1:14 AM   Specimen: Nasopharyngeal Swab  Result Value Ref Range Status   SARS Coronavirus 2 NEGATIVE NEGATIVE Final    Comment: (NOTE) If result is NEGATIVE SARS-CoV-2 target nucleic acids are NOT DETECTED. The SARS-CoV-2 RNA is generally detectable in upper and lower  respiratory specimens during the acute phase of infection. The lowest  concentration of SARS-CoV-2 viral copies this assay can detect is 250  copies / mL. A negative result does not preclude  SARS-CoV-2 infection  and should not be used as the sole basis for treatment or other  patient management decisions.  A negative result may occur with  improper specimen collection / handling, submission of specimen other  than nasopharyngeal swab, presence of viral mutation(s) within the  areas targeted by this assay, and inadequate number of viral copies  (<250 copies / mL). A negative result must be combined with clinical  observations, patient history, and  epidemiological information. If result is POSITIVE SARS-CoV-2 target nucleic acids are DETECTED. The SARS-CoV-2 RNA is generally detectable in upper and lower  respiratory specimens dur ing the acute phase of infection.  Positive  results are indicative of active infection with SARS-CoV-2.  Clinical  correlation with patient history and other diagnostic information is  necessary to determine patient infection status.  Positive results do  not rule out bacterial infection or co-infection with other viruses. If result is PRESUMPTIVE POSTIVE SARS-CoV-2 nucleic acids MAY BE PRESENT.   A presumptive positive result was obtained on the submitted specimen  and confirmed on repeat testing.  While 2019 novel coronavirus  (SARS-CoV-2) nucleic acids may be present in the submitted sample  additional confirmatory testing may be necessary for epidemiological  and / or clinical management purposes  to differentiate between  SARS-CoV-2 and other Sarbecovirus currently known to infect humans.  If clinically indicated additional testing with an alternate test  methodology [id number]) is advised. The SARS-CoV-2 RNA is generally  detectable in upper and lower respiratory sp ecimens during the acute  phase of infection. The expected result is Negative. Fact Sheet for Patients:  StrictlyIdeas.no Fact Sheet for Healthcare Providers: BankingDealers.co.za This test is not yet approved or cleared by the  [place] FDA and has been authorized for detection and/or diagnosis of SARS-CoV-2 by FDA under an Emergency Use Authorization (EUA).  This EUA will remain in effect (meaning this test can be used) for the duration of the COVID-19 declaration under Section 564(b)(1) of the Act, 21 U.S.C. section 360bbb-3(b)(1), unless the authorization is terminated or revoked sooner. Performed at [facility], [place] [address], [place], [place] [address]   Aerobic Culture (superficial specimen)     Status: None (Preliminary result)   Collection Time: 11/06/18  4:09 PM   Specimen: Foot  Result Value Ref Range Status   Specimen Description   Final    FOOT RIGHT Performed at [facility] [address] [place], [place] [address]    Special Requests   Final    NONE Performed at [facility], [place] [address] [place], [place] [address]    Gram Stain   Final    MODERATE WBC PRESENT, PREDOMINANTLY PMN ABUNDANT GRAM POSITIVE COCCI IN PAIRS Gram Stain Report Called to,Read Back By and Verified With: Jaymes Graff [phone] @ [phone] BY Martina Sinner Performed at [facility] [address] [place], [place] [address]    Culture   Final    CULTURE REINCUBATED FOR BETTER GROWTH Performed at [facility], [place] [address] [place],  [address]    Report Status PENDING  Incomplete         Radiology Studies: Mr Foot Right Wo Contrast  Result Date: 11/07/2018 CLINICAL DATA:  Blister along the medial aspect of the foot. Redness and swelling. EXAM: MRI OF THE RIGHT FOREFOOT WITHOUT CONTRAST TECHNIQUE: Multiplanar, multisequence MR imaging of the right foot was performed. No intravenous contrast was administered. COMPARISON:  None. FINDINGS: Bones/Joint/Cartilage No periosteal reaction or bone destruction. No aggressive osseous lesion. No acute fracture or dislocation. Normal alignment. No joint effusion. Mild osteoarthritis of the  first MTP joint. Moderate-severe osteoarthritis of the first TMT joint with subchondral reactive marrow edema and cystic changes. Mild osteoarthritis of the third and fourth tarsometatarsal joints with subchondral reactive marrow changes. Ligaments Collateral ligaments are intact.  Lisfranc ligament is intact. Muscles and Tendons Mild T2 hyperintense signal in the plantar musculature likely  neurogenic. No intramuscular fluid collection or hematoma. Flexor, extensor and peroneal tendons are intact. Soft tissue Soft tissue wound along the plantar medial aspect of the foot at the level of the first TMT joint with surrounding soft tissue edema concerning for mild cellulitis. No drainable fluid collection or hematoma. No soft tissue mass. IMPRESSION: 1. No osteomyelitis of the right forefoot. 2. Soft tissue wound along the plantar medial aspect of the foot at the level of the first TMT joint with surrounding soft tissue edema concerning for mild cellulitis. No drainable fluid collection. 3. Mild osteoarthritis of the first MTP joint. 4. Moderate-severe osteoarthritis of the first TMT joint with subchondral reactive marrow edema and cystic changes. 5. Mild osteoarthritis of the third and fourth tarsometatarsal joints with subchondral reactive marrow changes. Electronically Signed   By: Kathreen Devoid   On: 11/07/2018 07:56        Scheduled Meds: . allopurinol  300 mg Oral QPC breakfast  . atorvastatin  20 mg Oral QPM  . fenofibrate  160 mg Oral QPM  . gabapentin  300 mg Oral BID  . insulin aspart  0-5 Units Subcutaneous QHS  . insulin aspart  0-9 Units Subcutaneous TID WC  . lipase/protease/amylase  12,000 Units Oral TID WC  . multivitamin with minerals  1 tablet Oral Daily  . rivaroxaban  15 mg Oral Q supper  . sertraline  100 mg Oral QPC breakfast  . sodium chloride flush  10-40 mL Intracatheter Q12H  . traMADol  50 mg Oral Q6H  . vitamin B-12  2,500 mcg Oral Daily  . vitamin C  500 mg Oral QPC  breakfast   Continuous Infusions: . piperacillin-tazobactam (ZOSYN)  IV 3.375 g (11/08/18 1324)  . vancomycin Stopped (11/07/18 1330)     LOS: 2 days    Time spent: 35 minutes    Hosie Poisson, MD [facility] Pager [phone]  If 7PM-7AM, please contact night-coverage [email or web address] Password [facility] 11/08/2018, 11:07 AM

## 2018-11-08 NOTE — Progress Notes (Addendum)
HEMATOLOGY-ONCOLOGY PROGRESS NOTE  SUBJECTIVE: [name] reports that his foot feels better since I&D of right foot blister. Foot is now wrapped in gauze.  No recurrent fevers or chills.  Blood culture positive for Staphylococcus in both the aerobic and anaerobic bottles of 1 set of the blood cultures.  Remains on IV antibiotics.  REVIEW OF SYSTEMS:   All other systems were reviewed with the patient and are negative.  I have reviewed the past medical history, past surgical history, social history and family history with the patient and they are unchanged from previous note.   PHYSICAL EXAMINATION:  Vitals:   11/07/18 2046 11/08/18 0503  BP: (!) 104/52 (!) 93/58  Pulse: 62 (!) 55  Resp: 19 17  Temp: 98.5 F (36.9 C) 98.5 F (36.9 C)  SpO2: 98% 99%   Filed Weights   11/05/18 2311 11/06/18 0539 11/08/18 0500  Weight: 165 lb (74.8 kg) 158 lb 4.6 oz (71.8 kg) 154 lb 5.2 oz (70 kg)    Intake/Output from previous day: 07/15 0701 - 07/16 0700 In: 1344.6 [P.O.:780; IV Piggyback:564.6] Out: 950 [Urine:950]  GENERAL:alert, no distress and comfortable SKIN: No rashes.  Right foot wound is wrapped in gauze. LUNGS: clear to auscultation and percussion with normal breathing effort HEART: regular rate & rhythm and 2/6 SEM  Musculoskeletal:no cyanosis of digits and no clubbing  NEURO: alert & oriented x 3 with fluent speech, no focal motor/sensory deficits  LABORATORY DATA:  I have reviewed the data as listed CMP Latest Ref Rng & Units 11/08/2018 11/07/2018 11/06/2018  Glucose 70 - 99 mg/dL 167(H) 131(H) 172(H)  BUN 8 - 23 mg/dL 31(H) 30(H) 31(H)  Creatinine 0.61 - 1.24 mg/dL 1.38(H) 1.36(H) 1.35(H)  Sodium 135 - 145 mmol/L 132(L) 137 136  Potassium 3.5 - 5.1 mmol/L 4.7 4.6 4.0  Chloride 98 - 111 mmol/L 100 105 105  CO2 22 - 32 mmol/L _0 Calcium 8.9 - 10.3 mg/dL 9.2 9.2 9.0  Total Protein 6.5 - 8.1 g/dL - - 6.4(L)  Total Bilirubin 0.3 - 1.2 mg/dL - - 0.9  Alkaline Phos 38 - 126 U/L -  - 58  AST 15 - 41 U/L - - 38  ALT 0 - 44 U/L - - 34    Lab Results  Component Value Date   WBC 7.5 11/08/2018   HGB 10.0 (L) 11/08/2018   HCT 32.8 (L) 11/08/2018   MCV 87.0 11/08/2018   PLT 164 11/08/2018   NEUTROABS 11.5 (H) 11/05/2018    Mr Foot Right Wo Contrast  Result Date: 11/07/2018 CLINICAL DATA:  Blister along the medial aspect of the foot. Redness and swelling. EXAM: MRI OF THE RIGHT FOREFOOT WITHOUT CONTRAST TECHNIQUE: Multiplanar, multisequence MR imaging of the right foot was performed. No intravenous contrast was administered. COMPARISON:  None. FINDINGS: Bones/Joint/Cartilage No periosteal reaction or bone destruction. No aggressive osseous lesion. No acute fracture or dislocation. Normal alignment. No joint effusion. Mild osteoarthritis of the first MTP joint. Moderate-severe osteoarthritis of the first TMT joint with subchondral reactive marrow edema and cystic changes. Mild osteoarthritis of the third and fourth tarsometatarsal joints with subchondral reactive marrow changes. Ligaments Collateral ligaments are intact.  Lisfranc ligament is intact. Muscles and Tendons Mild T2 hyperintense signal in the plantar musculature likely neurogenic. No intramuscular fluid collection or hematoma. Flexor, extensor and peroneal tendons are intact. Soft tissue Soft tissue wound along the plantar medial aspect of the foot at the level of the first TMT joint with surrounding soft  tissue edema concerning for mild cellulitis. No drainable fluid collection or hematoma. No soft tissue mass. IMPRESSION: 1. No osteomyelitis of the right forefoot. 2. Soft tissue wound along the plantar medial aspect of the foot at the level of the first TMT joint with surrounding soft tissue edema concerning for mild cellulitis. No drainable fluid collection. 3. Mild osteoarthritis of the first MTP joint. 4. Moderate-severe osteoarthritis of the first TMT joint with subchondral reactive marrow edema and cystic changes.  5. Mild osteoarthritis of the third and fourth tarsometatarsal joints with subchondral reactive marrow changes. Electronically Signed   By: Kathreen Devoid   On: 11/07/2018 07:56   Dg Foot Complete Right  Result Date: 11/06/2018 CLINICAL DATA:  Painful blister and ulcer medial side of the foot EXAM: RIGHT FOOT COMPLETE - 3+ VIEW COMPARISON:  CT 03/04/2013 FINDINGS: No acute displaced fracture. Mild medial subluxation base of second proximal phalanx, appears chronic. Mild degenerative change at the first MTP joint. No erosions or bone destruction. Suspected ulcer medial side of foot near base of the metatarsal. Moderate to marked intertarsal degenerative change. Prominent dorsal osteophytes. Vascular calcifications. No soft tissue emphysema. Pes planus deformity. IMPRESSION: No acute osseous abnormality. Prominent intertarsal degenerative changes Electronically Signed   By: Donavan Foil M.D.   On: 11/06/2018 00:13    ASSESSMENT: 85 y.o. patient with a diagnosis of    #1 Primary hepatic non-Hodgkin's lymphoma established through liver biopsy February 2006, treated with Rituxan x9 and then CHOP x6.  All chemotherapy completed in 2007.  Maintenance Rituxan discontinued October 2008.     #2  Biopsy proven retroperitoneal recurrence documented November 2014. Bone marrow biopsy 04/01/2013 was negative   #3 status post  laparoscopic cholecystectomy and port placement on 04/04/2013.  #4 Cycle #1  RICE chemotherapy  completed on 05/02/2013.   #5 cycle #2 RICE chemotherapy  on 05/17/2013  #6 PET scan performed on 05/23/2013 revealed marked partial response to chemotherapy with a retroperitoneal mass decrease in metabolic activity from SUV of 13.2 to 6.3. Right adrenal mass size and metabolic activity is resolved when compared to the previous PET scan.   #7 Evaluated at [facility] by Dr. Tomasa Hosteller for autologous transplant on 05/27/2013 and was quoted a 3% chance of significant heart damage and possibly  death from the treatment and a 50% chance of being alive 5 years from now after transplant (versus perhaps 2 years without). After much thought the patient and family have decided firmly they do not wish to proceed to stem cell transplant.  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 treated with vancomycin IV x14 days completed 07/02/2013, followed by doxycycline for 2 additional weeks  #10 started maintenance Rituxan May 2015 (1 dose every 8 weeks)  #11 A-fib, on rivaroxaban  #12 prostate cancer, stage IV-- per Dr [name] (a) obstructive uropathy secondary to bladder mass (Gleason 9), s/p  L stenting 02/05/2015, exchanged PRN  #13 PET scan 04/29/2016 shows continuing evidence of response, with no significant abnormal hypermetabolic activity to indicate recurrent or active disease (a) repeat PET scan 02/13/2017 shows a continuing metabolic response  #26 iron deficiency anemia: Status post Feraheme  #15: High fall risk  #16: Right foot cellulitis   PLAN: [name] is almost 15 years from initial diagnosis of primary hepatic non-Hodgkin's lymphoma and 5 years out from his most recent chemotherapy, with no evidence of active disease.  We have been treating him with rituximab every  2 months and plan to continue this indefinitely until there is evidence of disease progression.  He currently has no "B" symptoms.  CBC has been reviewed.  White blood cell count is trending down and hemoglobin has improved to 10.0.  Recommend continued monitoring.  No transfusion is indicated.  The patient is currently admitted with right foot cellulitis.  1 set of blood cultures was positive for Staphylococcus.  He remains on IV antibiotics.  Status post incision and drainage of the right foot wound.  Wound culture shows abundant gram-positive cocci in pairs.  Orthopedics has recommended transition to oral antibiotics once the final culture  results are available and then outpatient follow-up with orthopedics in 1 week.  The patient will follow up with oncology as previously scheduled on 11/29/2022 his next dose of Rituxan.   LOS: 2 days   Mikey Bussing, DNP, AGPCNP-BC, AOCNP 11/08/18    ADDENDUM: "[name]" is improving on current trreatment and I anticipate he will be discharged in the next 24 hours or as soon as sensitivities are available. From a lymphoma point of view he remains very stable and we will resume rituximab 11/29/2018. We will see him the same day.  I personally saw this patient and performed a substantive portion of this encounter with the listed APP documented above.   [name], MD Medical Oncology and Hematology [facility] [address] [place], [place] [address] Tel. [phone]    Fax. [phone]

## 2018-11-09 ENCOUNTER — Inpatient Hospital Stay (HOSPITAL_COMMUNITY): Payer: Medicare Other

## 2018-11-09 DIAGNOSIS — I340 Nonrheumatic mitral (valve) insufficiency: Secondary | ICD-10-CM

## 2018-11-09 LAB — ECHOCARDIOGRAM COMPLETE
Height: 68 in
Weight: 2469.15 oz

## 2018-11-09 LAB — AEROBIC CULTURE W GRAM STAIN (SUPERFICIAL SPECIMEN)

## 2018-11-09 LAB — GLUCOSE, CAPILLARY
Glucose-Capillary: 158 mg/dL — ABNORMAL HIGH (ref 70–99)
Glucose-Capillary: 183 mg/dL — ABNORMAL HIGH (ref 70–99)
Glucose-Capillary: 164 mg/dL — ABNORMAL HIGH (ref 70–99)

## 2018-11-09 LAB — CREATININE, SERUM
Creatinine, Ser: 1.45 mg/dL — ABNORMAL HIGH (ref 0.61–1.24)
GFR calc Af Amer: 51 mL/min — ABNORMAL LOW (ref >60)
GFR calc non Af Amer: 44 mL/min — ABNORMAL LOW (ref >60)

## 2018-11-09 MED ORDER — CEPHALEXIN 500 MG PO CAPS: 500.0000 mg | ORAL_CAPSULE | Freq: Two times a day (BID) | ORAL | 0 refills | Status: DC

## 2018-11-09 MED ORDER — CEPHALEXIN 500 MG PO CAPS: 500.0000 mg | ORAL_CAPSULE | Freq: Two times a day (BID) | ORAL | 0 refills | Status: AC

## 2018-11-09 MED ORDER — HEPARIN SOD (PORK) LOCK FLUSH 100 UNIT/ML IV SOLN
500.0000 [IU] | INTRAVENOUS | Status: AC | PRN
  Administered 2018-11-09: 500 [IU]

## 2018-11-09 NOTE — Plan of Care (Signed)

## 2018-11-09 NOTE — Progress Notes (Signed)
OT Cancellation Note  Patient Details Name: [name] MRN: [id number] DOB: 03-Nov-1931   Cancelled Treatment:    Reason Eval/Treat Not Completed: OT screened, no needs identified, will sign off. Pt doesn't feel he will have any difficulty with adls. Was up walking with PT with min guard and has 24/7 assist at home.   Chenita Ruda 11/09/2018, 11:08 AM  Lesle Chris, OTR/L Acute Rehabilitation Services [phone] [facility] pager [phone] office 11/09/2018

## 2018-11-09 NOTE — Progress Notes (Signed)
  Echocardiogram 2D Echocardiogram has been performed.  Marcelus Dubberly L Androw 11/09/2018, 3:29 PM

## 2018-11-09 NOTE — Discharge Summary (Signed)
Physician Discharge Summary  [name]:[id number] DOB: [birth year] DOA: 11/05/2018  PCP: [name], MD  Admit date: 11/05/2018 Discharge date: 11/09/2018  Admitted From:Home. Disposition: Home  Recommendations for Outpatient Follow-up:  1. Follow up with PCP in 1-2 weeks 2. Please obtain BMP/CBC in one week 3. Please follow up with Dr Lorin Mercy as recommended.  4. You have been given a prescription for keflex for 10 days.    Discharge Condition:GUARDED.  CODE STATUS: DNR  Diet recommendation: Heart Healthy   Brief/Interim Summary: [name] a86 y.o.male,w hypertension, hyperlipidemia, Dm2, Pafib, CAD , Iron deficiency anemia, hx of primary hepatic non-Hodgkins lymphoma (liver biopsy 2006), retroperitoneal recurrence 2014, s/p RICE 2015, s/p Rituxan 2015, prostate cancer stage 4, (Gleason9), who presents with fever and c/o redness right foot, Pt states fever started yesterday. Redness might have started 1-2 days ago. Blister 1-2 days ago. pt was admitted for cellulitis of the right foot  Discharge Diagnoses:  Principal Problem:   Cellulitis in diabetic foot ([place]) Active Problems:   Non Hodgkin's lymphoma ([place])   Paroxysmal atrial fibrillation (HCC)   Fever   CKD (chronic kidney disease) stage 3, GFR 30-59 ml/min (HCC)   Essential hypertension   OSA (obstructive sleep apnea)   Iron deficiency anemia   Type II diabetes mellitus (HCC)   Essential hypertension   Cellulitis   Pressure injury of skin  Right foot cellulitis with foot blister:  S/p Incision and drainage at bedside and fluid sent for analysis and culture. cultures show coag neg staph species, pan sensitive. D/c home with oral keflex to complete a total of 14 days of treatment.  MRI of the foot done and was negative for osteomyelitis.     Stage 3 CKD:  Creatinine improved.    Hypertension:  Well controlled.    Type 2 DM:  Resume home meds.   PAF: Rate controlled.  On  xarelto for anti coagulation.     Mild thrombocytopenia:  No bleeding episodes.  Resolved.    coag neg staph species bacteremia.  Blood cultures one set growing coag negative staph, suspect its contamination. But after discussing with ID Dr Prince Rome, recommended to complete a 2 week treatment of antibiotics if the wound cultures are growing the same.  Repeat cultures ordered and negative so far.  Echocardiogram ordered.      Discharge Instructions  Discharge Instructions    Diet - low sodium heart healthy   Complete by: As directed    Discharge instructions   Complete by: As directed    Please follow up with Orthopedics in one week as recommended.     Allergies as of 11/09/2018      Reactions   Atenolol Other (See Comments)   "Heart rate slowed- STOPPED on 08/16/2013"   Niacin Other (See Comments)   headaches      Medication List    TAKE these medications   acetaminophen 500 MG tablet Commonly known as: TYLENOL He can take 2 tablets every 8 hours as needed for as long as he has discomfort.  You can buy this at any drug store over the counter.   allopurinol 300 MG tablet Commonly known as: ZYLOPRIM Take 300 mg by mouth daily after breakfast.   atorvastatin 20 MG tablet Commonly known as: LIPITOR Take 20 mg by mouth every evening.   CALCIUM 600/VITAMIN D3 PO Take 1 tablet by mouth 2 (two) times a day.   cephALEXin 500 MG capsule Commonly known as: KEFLEX Take 1 capsule (500 mg  total) by mouth 2 (two) times daily for 10 days. Start taking on: November 10, 2018   cetirizine 10 MG tablet Commonly known as: ZYRTEC Take 10 mg by mouth daily.   diclofenac sodium 1 % Gel Commonly known as: VOLTAREN Apply 2 g topically 4 (four) times daily. To right hip for pain control What changed:   when to take this  reasons to take this   fenofibrate 160 MG tablet Take 160 mg by mouth every evening.   gabapentin 300 MG capsule Commonly known as: NEURONTIN Take  300 mg by mouth 2 (two) times daily.   Insulin Glargine (2 Unit Dial) 300 UNIT/ML Sopn Commonly known as: Toujeo Max SoloStar Inject 10 Units into the skin at bedtime. What changed: when to take this   latanoprost 0.005 % ophthalmic solution Commonly known as: XALATAN Place 1 drop into both eyes at bedtime.   leuprolide 22.5 MG injection Commonly known as: LUPRON Inject 22.5 mg into the muscle every 3 (three) months.   lidocaine-prilocaine cream Commonly known as: EMLA Apply 1 application topically as needed (for port-a-cath before treatments- Rituxin; every 8 weeks).   lipase/protease/amylase 12000 units Cpep capsule Commonly known as: CREON Take 12,000 Units by mouth 3 (three) times daily with meals.   magnesium oxide 400 MG tablet Commonly known as: MAG-OX Take 400 mg by mouth 2 (two) times daily.   metFORMIN 500 MG 24 hr tablet Commonly known as: GLUCOPHAGE-XR Take 500 mg by mouth 2 (two) times a day.   One-A-Day Mens 50+ Advantage Tabs Take 1 tablet by mouth daily with breakfast.   Rivaroxaban 15 MG Tabs tablet Commonly known as: XARELTO Take 15 mg by mouth daily with lunch.   sertraline 100 MG tablet Commonly known as: ZOLOFT Take 100 mg by mouth daily after breakfast.   Vitamin B-12 2500 MCG Subl Place 2,500 mcg under the tongue every morning.   vitamin C 500 MG tablet Commonly known as: ASCORBIC ACID Take 500 mg by mouth daily after breakfast.      Follow-up Information    Marybelle Killings, MD Follow up in 1 week(s).   Specialty: Orthopedic Surgery Contact information: [address] [place] [address] [phone]          Allergies  Allergen Reactions  . Atenolol Other (See Comments)    "Heart rate slowed- STOPPED on 08/16/2013"  . Niacin Other (See Comments)    headaches    Consultations:  Orthopedics.   ID Dr Prince Rome over the phone.   Procedures/Studies: Mr Foot Right Wo Contrast  Result Date: 11/07/2018 CLINICAL  DATA:  Blister along the medial aspect of the foot. Redness and swelling. EXAM: MRI OF THE RIGHT FOREFOOT WITHOUT CONTRAST TECHNIQUE: Multiplanar, multisequence MR imaging of the right foot was performed. No intravenous contrast was administered. COMPARISON:  None. FINDINGS: Bones/Joint/Cartilage No periosteal reaction or bone destruction. No aggressive osseous lesion. No acute fracture or dislocation. Normal alignment. No joint effusion. Mild osteoarthritis of the first MTP joint. Moderate-severe osteoarthritis of the first TMT joint with subchondral reactive marrow edema and cystic changes. Mild osteoarthritis of the third and fourth tarsometatarsal joints with subchondral reactive marrow changes. Ligaments Collateral ligaments are intact.  Lisfranc ligament is intact. Muscles and Tendons Mild T2 hyperintense signal in the plantar musculature likely neurogenic. No intramuscular fluid collection or hematoma. Flexor, extensor and peroneal tendons are intact. Soft tissue Soft tissue wound along the plantar medial aspect of the foot at the level of the first TMT joint  with surrounding soft tissue edema concerning for mild cellulitis. No drainable fluid collection or hematoma. No soft tissue mass. IMPRESSION: 1. No osteomyelitis of the right forefoot. 2. Soft tissue wound along the plantar medial aspect of the foot at the level of the first TMT joint with surrounding soft tissue edema concerning for mild cellulitis. No drainable fluid collection. 3. Mild osteoarthritis of the first MTP joint. 4. Moderate-severe osteoarthritis of the first TMT joint with subchondral reactive marrow edema and cystic changes. 5. Mild osteoarthritis of the third and fourth tarsometatarsal joints with subchondral reactive marrow changes. Electronically Signed   By: Kathreen Devoid   On: 11/07/2018 07:56   Dg Foot Complete Right  Result Date: 11/06/2018 CLINICAL DATA:  Painful blister and ulcer medial side of the foot EXAM: RIGHT FOOT  COMPLETE - 3+ VIEW COMPARISON:  CT 03/04/2013 FINDINGS: No acute displaced fracture. Mild medial subluxation base of second proximal phalanx, appears chronic. Mild degenerative change at the first MTP joint. No erosions or bone destruction. Suspected ulcer medial side of foot near base of the metatarsal. Moderate to marked intertarsal degenerative change. Prominent dorsal osteophytes. Vascular calcifications. No soft tissue emphysema. Pes planus deformity. IMPRESSION: No acute osseous abnormality. Prominent intertarsal degenerative changes Electronically Signed   By: Donavan Foil M.D.   On: 11/06/2018 00:13       Subjective:  Wants to go home no new complaints.  Discharge Exam: Vitals:   11/09/18 0525 11/09/18 1342  BP: 128/69 108/64  Pulse: (!) 55 (!) 55  Resp: 18 12  Temp: (!) 97.4 F (36.3 C) 98.2 F (36.8 C)  SpO2: 97% 96%   Vitals:   11/08/18 1536 11/08/18 2118 11/09/18 0525 11/09/18 1342  BP: 107/64 (!) 117/58 128/69 108/64  Pulse: 62 60 (!) 55 (!) 55  Resp: 20 18 18 12   Temp: 98.2 F (36.8 C) 98.3 F (36.8 C) (!) 97.4 F (36.3 C) 98.2 F (36.8 C)  TempSrc: Oral  Oral Oral  SpO2: 100% 96% 97% 96%  Weight:      Height:        General: Pt is alert, awake, not in acute distress Cardiovascular: RRR, S1/S2 +, no rubs, no gallops Respiratory: CTA bilaterally, no wheezing, no rhonchi Abdominal: Soft, NT, ND, bowel sounds + Extremities:right foot wound.     The results of significant diagnostics from this hospitalization (including imaging, microbiology, ancillary and laboratory) are listed below for reference.     Microbiology: Recent Results (from the past 240 hour(s))  Blood culture (routine x 2)     Status: None (Preliminary result)   Collection Time: 11/05/18 11:24 PM   Specimen: BLOOD  Result Value Ref Range Status   Specimen Description   Final    BLOOD LEFT ANTECUBITAL Performed at [facility] [address] [place], [place]  [address]    Special Requests   Final    BOTTLES DRAWN AEROBIC AND ANAEROBIC Blood Culture results may not be optimal due to an excessive volume of blood received in culture bottles Performed at [facility] [address], [place], [place] [address]    Culture   Final    NO GROWTH 3 DAYS Performed at [facility], [place] [address] [place], [place] [address]    Report Status PENDING  Incomplete  Blood culture (routine x 2)     Status: Abnormal (Preliminary result)   Collection Time: 11/05/18 11:29 PM   Specimen: BLOOD  Result Value Ref Range Status  Specimen Description   Final    BLOOD RIGHT ANTECUBITAL Performed at [facility] [address] [place], [place] [address]    Special Requests   Final    BOTTLES DRAWN AEROBIC AND ANAEROBIC Blood Culture adequate volume Performed at [facility] [address] [place], [place] [address]    Culture  Setup Time   Final    IN BOTH AEROBIC AND ANAEROBIC BOTTLES GRAM POSITIVE COCCI CRITICAL RESULT CALLED TO, READ BACK BY AND VERIFIED WITH: Sheffield Slider [facility] 11/06/18 2053 JDW Performed at [facility], [place] [address] [place], [place] [address]    Culture STAPHYLOCOCCUS SPECIES (COAGULASE NEGATIVE) (A)  Final   Report Status PENDING  Incomplete   Organism ID, Bacteria STAPHYLOCOCCUS SPECIES (COAGULASE NEGATIVE)  Final      Susceptibility   Staphylococcus species (coagulase negative) - MIC*    CIPROFLOXACIN <=0.5 SENSITIVE Sensitive     ERYTHROMYCIN <=0.25 SENSITIVE Sensitive     GENTAMICIN <=0.5 SENSITIVE Sensitive     OXACILLIN <=0.25 SENSITIVE Sensitive     TETRACYCLINE <=1 SENSITIVE Sensitive     VANCOMYCIN <=0.5 SENSITIVE Sensitive     TRIMETH/SULFA <=10 SENSITIVE Sensitive     CLINDAMYCIN <=0.25 SENSITIVE Sensitive     RIFAMPIN <=0.5 SENSITIVE Sensitive     Inducible Clindamycin NEGATIVE Sensitive     * STAPHYLOCOCCUS SPECIES (COAGULASE NEGATIVE)  Blood  Culture ID Panel (Reflexed)     Status: Abnormal   Collection Time: 11/05/18 11:29 PM  Result Value Ref Range Status   Enterococcus species NOT DETECTED NOT DETECTED Final   Listeria monocytogenes NOT DETECTED NOT DETECTED Final   Staphylococcus species DETECTED (A) NOT DETECTED Final    Comment: Methicillin (oxacillin) susceptible coagulase negative staphylococcus. Possible blood culture contaminant (unless isolated from more than one blood culture draw or clinical case suggests pathogenicity). No antibiotic treatment is indicated for blood  culture contaminants. CRITICAL RESULT CALLED TO, READ BACK BY AND VERIFIED WITH: Sheffield Slider [facility] 11/06/18 2051 JDW    Staphylococcus aureus (BCID) NOT DETECTED NOT DETECTED Final   Methicillin resistance NOT DETECTED NOT DETECTED Final   Streptococcus species NOT DETECTED NOT DETECTED Final   Streptococcus agalactiae NOT DETECTED NOT DETECTED Final   Streptococcus pneumoniae NOT DETECTED NOT DETECTED Final   Streptococcus pyogenes NOT DETECTED NOT DETECTED Final   Acinetobacter baumannii NOT DETECTED NOT DETECTED Final   Enterobacteriaceae species NOT DETECTED NOT DETECTED Final   Enterobacter cloacae complex NOT DETECTED NOT DETECTED Final   Escherichia coli NOT DETECTED NOT DETECTED Final   Klebsiella oxytoca NOT DETECTED NOT DETECTED Final   Klebsiella pneumoniae NOT DETECTED NOT DETECTED Final   Proteus species NOT DETECTED NOT DETECTED Final   Serratia marcescens NOT DETECTED NOT DETECTED Final   Haemophilus influenzae NOT DETECTED NOT DETECTED Final   Neisseria meningitidis NOT DETECTED NOT DETECTED Final   Pseudomonas aeruginosa NOT DETECTED NOT DETECTED Final   Candida albicans NOT DETECTED NOT DETECTED Final   Candida glabrata NOT DETECTED NOT DETECTED Final   Candida krusei NOT DETECTED NOT DETECTED Final   Candida parapsilosis NOT DETECTED NOT DETECTED Final   Candida tropicalis NOT DETECTED NOT DETECTED Final    Comment: Performed  at [facility], [address] [place],  [address]  SARS Coronavirus 2 (CEPHEID - Performed in [facility] hospital lab), Hosp Order     Status: None   Collection Time: 11/06/18  1:14 AM   Specimen: Nasopharyngeal Swab  Result  Value Ref Range Status   SARS Coronavirus 2 NEGATIVE NEGATIVE Final    Comment: (NOTE) If result is NEGATIVE SARS-CoV-2 target nucleic acids are NOT DETECTED. The SARS-CoV-2 RNA is generally detectable in upper and lower  respiratory specimens during the acute phase of infection. The lowest  concentration of SARS-CoV-2 viral copies this assay can detect is 250  copies / mL. A negative result does not preclude SARS-CoV-2 infection  and should not be used as the sole basis for treatment or other  patient management decisions.  A negative result may occur with  improper specimen collection / handling, submission of specimen other  than nasopharyngeal swab, presence of viral mutation(s) within the  areas targeted by this assay, and inadequate number of viral copies  (<250 copies / mL). A negative result must be combined with clinical  observations, patient history, and epidemiological information. If result is POSITIVE SARS-CoV-2 target nucleic acids are DETECTED. The SARS-CoV-2 RNA is generally detectable in upper and lower  respiratory specimens dur ing the acute phase of infection.  Positive  results are indicative of active infection with SARS-CoV-2.  Clinical  correlation with patient history and other diagnostic information is  necessary to determine patient infection status.  Positive results do  not rule out bacterial infection or co-infection with other viruses. If result is PRESUMPTIVE POSTIVE SARS-CoV-2 nucleic acids MAY BE PRESENT.   A presumptive positive result was obtained on the submitted specimen  and confirmed on repeat testing.  While 2019 novel coronavirus  (SARS-CoV-2) nucleic acids may be present in the submitted sample   additional confirmatory testing may be necessary for epidemiological  and / or clinical management purposes  to differentiate between  SARS-CoV-2 and other Sarbecovirus currently known to infect humans.  If clinically indicated additional testing with an alternate test  methodology [phone]) is advised. The SARS-CoV-2 RNA is generally  detectable in upper and lower respiratory sp ecimens during the acute  phase of infection. The expected result is Negative. Fact Sheet for Patients:  StrictlyIdeas.no Fact Sheet for Healthcare Providers: BankingDealers.co.za This test is not yet approved or cleared by the [place] FDA and has been authorized for detection and/or diagnosis of SARS-CoV-2 by FDA under an Emergency Use Authorization (EUA).  This EUA will remain in effect (meaning this test can be used) for the duration of the COVID-19 declaration under Section 564(b)(1) of the Act, 21 U.S.C. section 360bbb-3(b)(1), unless the authorization is terminated or revoked sooner. Performed at [facility], [place] [address] [place], [place] [address]   Aerobic Culture (superficial specimen)     Status: None   Collection Time: 11/06/18  4:09 PM   Specimen: Foot  Result Value Ref Range Status   Specimen Description   Final    FOOT RIGHT Performed at [facility] [address] [place], [place] [address]    Special Requests   Final    NONE Performed at [facility], [place] [address] [place], [place] [address]    Gram Stain   Final    MODERATE WBC PRESENT, PREDOMINANTLY PMN ABUNDANT GRAM POSITIVE COCCI IN PAIRS Gram Stain Report Called to,Read Back By and Verified With: Jaymes Graff [phone] @ 0263 BY Martina Sinner Performed at [facility] [address] [place], [place] [address]    Culture   Final    ABUNDANT STAPHYLOCOCCUS SPECIES (COAGULASE NEGATIVE)   Report Status  11/09/2018 FINAL  Final   Organism ID, Bacteria STAPHYLOCOCCUS SPECIES (  COAGULASE NEGATIVE)  Final      Susceptibility   Staphylococcus species (coagulase negative) - MIC*    CIPROFLOXACIN <=0.5 SENSITIVE Sensitive     ERYTHROMYCIN <=0.25 SENSITIVE Sensitive     GENTAMICIN <=0.5 SENSITIVE Sensitive     OXACILLIN <=0.25 SENSITIVE Sensitive     TETRACYCLINE <=1 SENSITIVE Sensitive     VANCOMYCIN <=0.5 SENSITIVE Sensitive     TRIMETH/SULFA <=10 SENSITIVE Sensitive     CLINDAMYCIN <=0.25 SENSITIVE Sensitive     RIFAMPIN <=0.5 SENSITIVE Sensitive     Inducible Clindamycin NEGATIVE Sensitive     * ABUNDANT STAPHYLOCOCCUS SPECIES (COAGULASE NEGATIVE)  Culture, blood (Routine X 2) w Reflex to ID Panel     Status: None (Preliminary result)   Collection Time: 11/08/18 11:01 AM   Specimen: BLOOD  Result Value Ref Range Status   Specimen Description   Final    BLOOD RIGHT ANTECUBITAL Performed at [facility] [address] [place], [place] [address]    Special Requests   Final    BOTTLES DRAWN AEROBIC AND ANAEROBIC Blood Culture adequate volume Performed at [facility] [address], [place], [place] [address]    Culture   Final    NO GROWTH 1 DAY Performed at [facility], [place] [address] [place], [place] [address]    Report Status PENDING  Incomplete  Culture, blood (Routine X 2) w Reflex to ID Panel     Status: None (Preliminary result)   Collection Time: 11/08/18 11:07 AM   Specimen: BLOOD RIGHT HAND  Result Value Ref Range Status   Specimen Description   Final    BLOOD RIGHT HAND Performed at [facility] [address], [place], [place] [address]    Special Requests   Final    BOTTLES DRAWN AEROBIC AND ANAEROBIC Blood Culture results may not be optimal due to an inadequate volume of blood received in culture bottles Performed at [facility] [address], [place], [place] [address]     Culture   Final    NO GROWTH 1 DAY Performed at [facility], [place] [address], [place], [place] [address]    Report Status PENDING  Incomplete     Labs: BNP (last 3 results) No results for input(s): BNP in the last 8760 hours. Basic Metabolic Panel: Recent Labs  Lab 11/05/18 2324 11/06/18 0638 11/07/18 0601 11/08/18 0423 11/09/18 0427  NA 133* 136 137 132*  --   K 4.2 4.0 4.6 4.7  --   CL 100 105 105 100  --   CO2 22 23 23 23   --   GLUCOSE 148* 172* 131* 167*  --   BUN 36* 31* 30* 31*  --   CREATININE 1.52* 1.35* 1.36* 1.38* 1.45*  CALCIUM 9.1 9.0 9.2 9.2  --    Liver Function Tests: Recent Labs  Lab 11/05/18 2324 11/06/18 0638  AST 47* 38  ALT 38 34  ALKPHOS 66 58  BILITOT 0.4 0.9  PROT 6.9 6.4*  ALBUMIN 4.1 3.8   No results for input(s): LIPASE, AMYLASE in the last 168 hours. No results for input(s): AMMONIA in the last 168 hours. CBC: Recent Labs  Lab 11/05/18 2324 11/06/18 0638 11/08/18 0423  WBC 12.7* 10.1 7.5  NEUTROABS 11.5*  --   --   HGB 9.7* 8.9* 10.0*  HCT 30.6* 29.3* 32.8*  MCV 85.7 87.2 87.0  PLT 145* 126* 164   Cardiac Enzymes: No  results for input(s): CKTOTAL, CKMB, CKMBINDEX, TROPONINI in the last 168 hours. BNP: Invalid input(s): POCBNP CBG: Recent Labs  Lab 11/08/18 1156 11/08/18 1643 11/08/18 2116 11/09/18 0740 11/09/18 1128  GLUCAP 164* 169* 161* 158* 183*   D-Dimer No results for input(s): DDIMER in the last 72 hours. Hgb A1c No results for input(s): HGBA1C in the last 72 hours. Lipid Profile No results for input(s): CHOL, HDL, LDLCALC, TRIG, CHOLHDL, LDLDIRECT in the last 72 hours. Thyroid function studies No results for input(s): TSH, T4TOTAL, T3FREE, THYROIDAB in the last 72 hours.  Invalid input(s): FREET3 Anemia work up No results for input(s): VITAMINB12, FOLATE, FERRITIN, TIBC, IRON, RETICCTPCT in the last 72 hours. Urinalysis    Component Value Date/Time   COLORURINE YELLOW 07/04/2018 1000    APPEARANCEUR TURBID (A) 07/04/2018 1000   LABSPEC 1.008 07/04/2018 1000   LABSPEC 1.020 04/24/2015 0846   PHURINE 6.0 07/04/2018 1000   GLUCOSEU >=500 (A) 07/04/2018 1000   GLUCOSEU 250 04/24/2015 0846   HGBUR LARGE (A) 07/04/2018 1000   BILIRUBINUR NEGATIVE 07/04/2018 1000   BILIRUBINUR Negative 04/24/2015 0846   KETONESUR NEGATIVE 07/04/2018 1000   PROTEINUR 30 (A) 07/04/2018 1000   UROBILINOGEN 0.2 04/24/2015 0846   NITRITE NEGATIVE 07/04/2018 1000   LEUKOCYTESUR LARGE (A) 07/04/2018 1000   LEUKOCYTESUR Negative 04/24/2015 0846   Sepsis Labs Invalid input(s): PROCALCITONIN,  WBC,  LACTICIDVEN Microbiology Recent Results (from the past 240 hour(s))  Blood culture (routine x 2)     Status: None (Preliminary result)   Collection Time: 11/05/18 11:24 PM   Specimen: BLOOD  Result Value Ref Range Status   Specimen Description   Final    BLOOD LEFT ANTECUBITAL Performed at [facility], [place] [address], [place], [place] [address]    Special Requests   Final    BOTTLES DRAWN AEROBIC AND ANAEROBIC Blood Culture results may not be optimal due to an excessive volume of blood received in culture bottles Performed at [facility] [address] [place], [place] [address]    Culture   Final    NO GROWTH 3 DAYS Performed at [facility], [place] [address] [place], [place] [address]    Report Status PENDING  Incomplete  Blood culture (routine x 2)     Status: Abnormal (Preliminary result)   Collection Time: 11/05/18 11:29 PM   Specimen: BLOOD  Result Value Ref Range Status   Specimen Description   Final    BLOOD RIGHT ANTECUBITAL Performed at [facility] [address] [place], [place] [address]    Special Requests   Final    BOTTLES DRAWN AEROBIC AND ANAEROBIC Blood Culture adequate volume Performed at [facility] [address], [place], [place] [address]    Culture  Setup Time   Final    IN  BOTH AEROBIC AND ANAEROBIC BOTTLES GRAM POSITIVE COCCI CRITICAL RESULT CALLED TO, READ BACK BY AND VERIFIED WITH: Sheffield Slider [facility] 11/06/18 2053 JDW Performed at [facility], [place] [address] [place], [place] [address]    Culture STAPHYLOCOCCUS SPECIES (COAGULASE NEGATIVE) (A)  Final   Report Status PENDING  Incomplete   Organism ID, Bacteria STAPHYLOCOCCUS SPECIES (COAGULASE NEGATIVE)  Final      Susceptibility   Staphylococcus species (coagulase negative) - MIC*    CIPROFLOXACIN <=0.5 SENSITIVE Sensitive     ERYTHROMYCIN <=0.25 SENSITIVE Sensitive     GENTAMICIN <=0.5 SENSITIVE Sensitive     OXACILLIN <=0.25 SENSITIVE  Sensitive     TETRACYCLINE <=1 SENSITIVE Sensitive     VANCOMYCIN <=0.5 SENSITIVE Sensitive     TRIMETH/SULFA <=10 SENSITIVE Sensitive     CLINDAMYCIN <=0.25 SENSITIVE Sensitive     RIFAMPIN <=0.5 SENSITIVE Sensitive     Inducible Clindamycin NEGATIVE Sensitive     * STAPHYLOCOCCUS SPECIES (COAGULASE NEGATIVE)  Blood Culture ID Panel (Reflexed)     Status: Abnormal   Collection Time: 11/05/18 11:29 PM  Result Value Ref Range Status   Enterococcus species NOT DETECTED NOT DETECTED Final   Listeria monocytogenes NOT DETECTED NOT DETECTED Final   Staphylococcus species DETECTED (A) NOT DETECTED Final    Comment: Methicillin (oxacillin) susceptible coagulase negative staphylococcus. Possible blood culture contaminant (unless isolated from more than one blood culture draw or clinical case suggests pathogenicity). No antibiotic treatment is indicated for blood  culture contaminants. CRITICAL RESULT CALLED TO, READ BACK BY AND VERIFIED WITH: Sheffield Slider [facility] 11/06/18 2051 JDW    Staphylococcus aureus (BCID) NOT DETECTED NOT DETECTED Final   Methicillin resistance NOT DETECTED NOT DETECTED Final   Streptococcus species NOT DETECTED NOT DETECTED Final   Streptococcus agalactiae NOT DETECTED NOT DETECTED Final   Streptococcus pneumoniae NOT DETECTED NOT DETECTED  Final   Streptococcus pyogenes NOT DETECTED NOT DETECTED Final   Acinetobacter baumannii NOT DETECTED NOT DETECTED Final   Enterobacteriaceae species NOT DETECTED NOT DETECTED Final   Enterobacter cloacae complex NOT DETECTED NOT DETECTED Final   Escherichia coli NOT DETECTED NOT DETECTED Final   Klebsiella oxytoca NOT DETECTED NOT DETECTED Final   Klebsiella pneumoniae NOT DETECTED NOT DETECTED Final   Proteus species NOT DETECTED NOT DETECTED Final   Serratia marcescens NOT DETECTED NOT DETECTED Final   Haemophilus influenzae NOT DETECTED NOT DETECTED Final   Neisseria meningitidis NOT DETECTED NOT DETECTED Final   Pseudomonas aeruginosa NOT DETECTED NOT DETECTED Final   Candida albicans NOT DETECTED NOT DETECTED Final   Candida glabrata NOT DETECTED NOT DETECTED Final   Candida krusei NOT DETECTED NOT DETECTED Final   Candida parapsilosis NOT DETECTED NOT DETECTED Final   Candida tropicalis NOT DETECTED NOT DETECTED Final    Comment: Performed at [facility], [address], [place], [place] [address]  SARS Coronavirus 2 (CEPHEID - Performed in [facility] hospital lab), Hosp Order     Status: None   Collection Time: 11/06/18  1:14 AM   Specimen: Nasopharyngeal Swab  Result Value Ref Range Status   SARS Coronavirus 2 NEGATIVE NEGATIVE Final    Comment: (NOTE) If result is NEGATIVE SARS-CoV-2 target nucleic acids are NOT DETECTED. The SARS-CoV-2 RNA is generally detectable in upper and lower  respiratory specimens during the acute phase of infection. The lowest  concentration of SARS-CoV-2 viral copies this assay can detect is 250  copies / mL. A negative result does not preclude SARS-CoV-2 infection  and should not be used as the sole basis for treatment or other  patient management decisions.  A negative result may occur with  improper specimen collection / handling, submission of specimen other  than nasopharyngeal swab, presence of viral mutation(s) within the  areas  targeted by this assay, and inadequate number of viral copies  (<250 copies / mL). A negative result must be combined with clinical  observations, patient history, and epidemiological information. If result is POSITIVE SARS-CoV-2 target nucleic acids are DETECTED. The SARS-CoV-2 RNA is generally detectable in upper and lower  respiratory specimens dur ing the acute phase of infection.  Positive  results are indicative of active infection with SARS-CoV-2.  Clinical  correlation with patient history and other diagnostic information is  necessary to determine patient infection status.  Positive results do  not rule out bacterial infection or co-infection with other viruses. If result is PRESUMPTIVE POSTIVE SARS-CoV-2 nucleic acids MAY BE PRESENT.   A presumptive positive result was obtained on the submitted specimen  and confirmed on repeat testing.  While 2019 novel coronavirus  (SARS-CoV-2) nucleic acids may be present in the submitted sample  additional confirmatory testing may be necessary for epidemiological  and / or clinical management purposes  to differentiate between  SARS-CoV-2 and other Sarbecovirus currently known to infect humans.  If clinically indicated additional testing with an alternate test  methodology [phone]) is advised. The SARS-CoV-2 RNA is generally  detectable in upper and lower respiratory sp ecimens during the acute  phase of infection. The expected result is Negative. Fact Sheet for Patients:  StrictlyIdeas.no Fact Sheet for Healthcare Providers: BankingDealers.co.za This test is not yet approved or cleared by the [place] FDA and has been authorized for detection and/or diagnosis of SARS-CoV-2 by FDA under an Emergency Use Authorization (EUA).  This EUA will remain in effect (meaning this test can be used) for the duration of the COVID-19 declaration under Section 564(b)(1) of the Act, 21 U.S.C. section  360bbb-3(b)(1), unless the authorization is terminated or revoked sooner. Performed at [facility], [place] [address] [place], [place] [address]   Aerobic Culture (superficial specimen)     Status: None   Collection Time: 11/06/18  4:09 PM   Specimen: Foot  Result Value Ref Range Status   Specimen Description   Final    FOOT RIGHT Performed at [facility] [address], [place], [place] [address]    Special Requests   Final    NONE Performed at [facility], [place] [address], [place], [place] [address]    Gram Stain   Final    MODERATE WBC PRESENT, PREDOMINANTLY PMN ABUNDANT GRAM POSITIVE COCCI IN PAIRS Gram Stain Report Called to,Read Back By and Verified With: Jaymes Graff [phone] @ 0623 BY Martina Sinner Performed at [facility] [address] [place], [place] [address]    Culture   Final    ABUNDANT STAPHYLOCOCCUS SPECIES (COAGULASE NEGATIVE)   Report Status 11/09/2018 FINAL  Final   Organism ID, Bacteria STAPHYLOCOCCUS SPECIES (COAGULASE NEGATIVE)  Final      Susceptibility   Staphylococcus species (coagulase negative) - MIC*    CIPROFLOXACIN <=0.5 SENSITIVE Sensitive     ERYTHROMYCIN <=0.25 SENSITIVE Sensitive     GENTAMICIN <=0.5 SENSITIVE Sensitive     OXACILLIN <=0.25 SENSITIVE Sensitive     TETRACYCLINE <=1 SENSITIVE Sensitive     VANCOMYCIN <=0.5 SENSITIVE Sensitive     TRIMETH/SULFA <=10 SENSITIVE Sensitive     CLINDAMYCIN <=0.25 SENSITIVE Sensitive     RIFAMPIN <=0.5 SENSITIVE Sensitive     Inducible Clindamycin NEGATIVE Sensitive     * ABUNDANT STAPHYLOCOCCUS SPECIES (COAGULASE NEGATIVE)  Culture, blood (Routine X 2) w Reflex to ID Panel     Status: None (Preliminary result)   Collection Time: 11/08/18 11:01 AM   Specimen: BLOOD  Result Value Ref Range Status   Specimen Description   Final    BLOOD RIGHT ANTECUBITAL Performed at [facility] [address]  [place], [place] [address]    Special Requests   Final    BOTTLES  DRAWN AEROBIC AND ANAEROBIC Blood Culture adequate volume Performed at [facility] [address], [place], [place] [address]    Culture   Final    NO GROWTH 1 DAY Performed at [facility], [place] [address], [place], [place] [address]    Report Status PENDING  Incomplete  Culture, blood (Routine X 2) w Reflex to ID Panel     Status: None (Preliminary result)   Collection Time: 11/08/18 11:07 AM   Specimen: BLOOD RIGHT HAND  Result Value Ref Range Status   Specimen Description   Final    BLOOD RIGHT HAND Performed at [facility] [address] [place], [place] [address]    Special Requests   Final    BOTTLES DRAWN AEROBIC AND ANAEROBIC Blood Culture results may not be optimal due to an inadequate volume of blood received in culture bottles Performed at [facility] [address], [place], [place] [address]    Culture   Final    NO GROWTH 1 DAY Performed at [facility], [place] [address] [place], [place] [address]    Report Status PENDING  Incomplete     Time coordinating discharge: Over 32 minutes  SIGNED:   Hosie Poisson, MD  [facility] 11/09/2018, 2:31 PM Pager   If 7PM-7AM, please contact night-coverage [email or web address] Password [email or web address]

## 2018-11-09 NOTE — Progress Notes (Signed)
[name] to be D/C'd Home per MD order.  Discussed prescriptions and follow up appointments with the patient. Prescriptions given to patient, medication list explained in detail. Pt verbalized understanding.  Allergies as of 11/09/2018      Reactions   Atenolol Other (See Comments)   "Heart rate slowed- STOPPED on 08/16/2013"   Niacin Other (See Comments)   headaches      Medication List    TAKE these medications   acetaminophen 500 MG tablet Commonly known as: TYLENOL He can take 2 tablets every 8 hours as needed for as long as he has discomfort.  You can buy this at any drug store over the counter.   allopurinol 300 MG tablet Commonly known as: ZYLOPRIM Take 300 mg by mouth daily after breakfast.   atorvastatin 20 MG tablet Commonly known as: LIPITOR Take 20 mg by mouth every evening.   CALCIUM 600/VITAMIN D3 PO Take 1 tablet by mouth 2 (two) times a day.   cephALEXin 500 MG capsule Commonly known as: KEFLEX Take 1 capsule (500 mg total) by mouth 2 (two) times daily for 10 days. Start taking on: November 10, 2018   cetirizine 10 MG tablet Commonly known as: ZYRTEC Take 10 mg by mouth daily.   diclofenac sodium 1 % Gel Commonly known as: VOLTAREN Apply 2 g topically 4 (four) times daily. To right hip for pain control What changed:   when to take this  reasons to take this   fenofibrate 160 MG tablet Take 160 mg by mouth every evening.   gabapentin 300 MG capsule Commonly known as: NEURONTIN Take 300 mg by mouth 2 (two) times daily.   Insulin Glargine (2 Unit Dial) 300 UNIT/ML Sopn Commonly known as: Toujeo Max SoloStar Inject 10 Units into the skin at bedtime. What changed: when to take this   latanoprost 0.005 % ophthalmic solution Commonly known as: XALATAN Place 1 drop into both eyes at bedtime.   leuprolide 22.5 MG injection Commonly known as: LUPRON Inject 22.5 mg into the muscle every 3 (three) months.   lidocaine-prilocaine cream Commonly  known as: EMLA Apply 1 application topically as needed (for port-a-cath before treatments- Rituxin; every 8 weeks).   lipase/protease/amylase 12000 units Cpep capsule Commonly known as: CREON Take 12,000 Units by mouth 3 (three) times daily with meals.   magnesium oxide 400 MG tablet Commonly known as: MAG-OX Take 400 mg by mouth 2 (two) times daily.   metFORMIN 500 MG 24 hr tablet Commonly known as: GLUCOPHAGE-XR Take 500 mg by mouth 2 (two) times a day.   One-A-Day Mens 50+ Advantage Tabs Take 1 tablet by mouth daily with breakfast.   Rivaroxaban 15 MG Tabs tablet Commonly known as: XARELTO Take 15 mg by mouth daily with lunch.   sertraline 100 MG tablet Commonly known as: ZOLOFT Take 100 mg by mouth daily after breakfast.   Vitamin B-12 2500 MCG Subl Place 2,500 mcg under the tongue every morning.   vitamin C 500 MG tablet Commonly known as: ASCORBIC ACID Take 500 mg by mouth daily after breakfast.       Vitals:   11/09/18 0525 11/09/18 1342  BP: 128/69 108/64  Pulse: (!) 55 (!) 55  Resp: 18 12  Temp: (!) 97.4 F (36.3 C) 98.2 F (36.8 C)  SpO2: 97% 96%    Skin clean, dry and intact without evidence of skin break down, no evidence of skin tears noted. IV catheter discontinued intact. Site without signs and symptoms  of complications. Dressing and pressure applied. Pt denies pain at this time. No complaints noted.  An After Visit Summary was printed and given to the patient. Patient escorted via [place], and D/C home via private auto.  Lolita Rieger 11/09/2018 4:38 PM

## 2018-11-11 LAB — CULTURE, BLOOD (ROUTINE X 2): Culture: NO GROWTH

## 2018-11-13 LAB — CULTURE, BLOOD (ROUTINE X 2)
Culture: NO GROWTH
Culture: NO GROWTH
Special Requests: ADEQUATE
Special Requests: ADEQUATE

## 2018-11-20 ENCOUNTER — Ambulatory Visit (INDEPENDENT_AMBULATORY_CARE_PROVIDER_SITE_OTHER): Payer: Medicare Other | Admitting: Orthopaedic Surgery

## 2018-11-20 ENCOUNTER — Encounter: Payer: Self-pay | Admitting: Orthopaedic Surgery

## 2018-11-20 DIAGNOSIS — M79671 Pain in right foot: Secondary | ICD-10-CM | POA: Diagnosis not present
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
## 2018-11-21 ENCOUNTER — Telehealth: Payer: Self-pay | Admitting: Orthopaedic Surgery

## 2018-11-21 DIAGNOSIS — L570 Actinic keratosis: Secondary | ICD-10-CM | POA: Diagnosis not present

## 2018-11-21 DIAGNOSIS — D485 Neoplasm of uncertain behavior of skin: Secondary | ICD-10-CM | POA: Diagnosis not present

## 2018-11-21 DIAGNOSIS — Z85828 Personal history of other malignant neoplasm of skin: Secondary | ICD-10-CM | POA: Diagnosis not present

## 2018-11-21 DIAGNOSIS — D045 Carcinoma in situ of skin of trunk: Secondary | ICD-10-CM | POA: Diagnosis not present

## 2018-11-21 NOTE — Telephone Encounter (Signed)
Per Autumn and Dr. Sharol Given, [employer] [facility] [place] do not make diabetic shoes any longer unless there is some type of prosthesis that would be fitted as well. Patient can be referred to [facility] for diabetic shoes.

## 2018-11-21 NOTE — Telephone Encounter (Signed)
[name] pt's daughter states that [employer] [employer] have the diabetic shoes. She wants to know if Dr Lorin Mercy knew of another location that they can get the shoes from. Karen's call back # [phone]

## 2018-11-22 DIAGNOSIS — Z85828 Personal history of other malignant neoplasm of skin: Secondary | ICD-10-CM | POA: Diagnosis not present

## 2018-11-22 DIAGNOSIS — C44629 Squamous cell carcinoma of skin of left upper limb, including shoulder: Secondary | ICD-10-CM | POA: Diagnosis not present

## 2018-11-22 NOTE — Telephone Encounter (Signed)
[name] for me to enter referral for patient to go to [facility] for diabetic shoes?

## 2018-11-25 NOTE — Telephone Encounter (Signed)
OK - thanks

## 2018-11-26 NOTE — Telephone Encounter (Signed)
I called [name] and advised. She has already called [facility] and patient has appt there to see doctor and to be fitted for the diabetic shoes. She states that in order to get insurance to pay, the information will then be sent to his PCP, who treats him for the diabetes. She will call if she needs anything further from [place].

## 2018-11-29 ENCOUNTER — Encounter: Payer: Self-pay | Admitting: Adult Health

## 2018-11-29 ENCOUNTER — Inpatient Hospital Stay: Payer: Medicare Other | Attending: Oncology

## 2018-11-29 ENCOUNTER — Inpatient Hospital Stay (HOSPITAL_BASED_OUTPATIENT_CLINIC_OR_DEPARTMENT_OTHER): Payer: Medicare Other | Admitting: Adult Health

## 2018-11-29 ENCOUNTER — Other Ambulatory Visit: Payer: Self-pay

## 2018-11-29 ENCOUNTER — Other Ambulatory Visit: Payer: BLUE CROSS/BLUE SHIELD

## 2018-11-29 ENCOUNTER — Inpatient Hospital Stay: Payer: Medicare Other

## 2018-11-29 VITALS — BP 102/54 | HR 66 | Temp 98.0°F | Resp 18 | Ht 68.0 in | Wt 152.1 lb

## 2018-11-29 VITALS — BP 124/69 | HR 59 | Temp 97.9°F | Resp 16

## 2018-11-29 DIAGNOSIS — C61 Malignant neoplasm of prostate: Secondary | ICD-10-CM | POA: Insufficient documentation

## 2018-11-29 DIAGNOSIS — D631 Anemia in chronic kidney disease: Secondary | ICD-10-CM

## 2018-11-29 DIAGNOSIS — N183 Chronic kidney disease, stage 3 unspecified: Secondary | ICD-10-CM

## 2018-11-29 DIAGNOSIS — C8590 Non-Hodgkin lymphoma, unspecified, unspecified site: Secondary | ICD-10-CM | POA: Diagnosis not present

## 2018-11-29 DIAGNOSIS — Z5112 Encounter for antineoplastic immunotherapy: Secondary | ICD-10-CM | POA: Diagnosis not present

## 2018-11-29 DIAGNOSIS — I4891 Unspecified atrial fibrillation: Secondary | ICD-10-CM | POA: Diagnosis not present

## 2018-11-29 DIAGNOSIS — E119 Type 2 diabetes mellitus without complications: Secondary | ICD-10-CM | POA: Diagnosis not present

## 2018-11-29 DIAGNOSIS — Z79899 Other long term (current) drug therapy: Secondary | ICD-10-CM | POA: Insufficient documentation

## 2018-11-29 DIAGNOSIS — Z95828 Presence of other vascular implants and grafts: Secondary | ICD-10-CM

## 2018-11-29 DIAGNOSIS — I10 Essential (primary) hypertension: Secondary | ICD-10-CM | POA: Diagnosis not present

## 2018-11-29 DIAGNOSIS — C8580 Other specified types of non-Hodgkin lymphoma, unspecified site: Secondary | ICD-10-CM

## 2018-11-29 DIAGNOSIS — Z794 Long term (current) use of insulin: Secondary | ICD-10-CM | POA: Insufficient documentation

## 2018-11-29 DIAGNOSIS — C8203 Follicular lymphoma grade I, intra-abdominal lymph nodes: Secondary | ICD-10-CM

## 2018-11-29 DIAGNOSIS — D509 Iron deficiency anemia, unspecified: Secondary | ICD-10-CM | POA: Insufficient documentation

## 2018-11-29 DIAGNOSIS — Z7984 Long term (current) use of oral hypoglycemic drugs: Secondary | ICD-10-CM | POA: Insufficient documentation

## 2018-11-29 DIAGNOSIS — C8219 Follicular lymphoma grade II, extranodal and solid organ sites: Secondary | ICD-10-CM

## 2018-11-29 LAB — CBC WITH DIFFERENTIAL/PLATELET
Abs Immature Granulocytes: 0.05 10*3/uL (ref 0.00–0.07)
Basophils Absolute: 0 10*3/uL (ref 0.0–0.1)
Basophils Relative: 0 %
Eosinophils Absolute: 0.2 10*3/uL (ref 0.0–0.5)
Eosinophils Relative: 3 %
HCT: 33.4 % — ABNORMAL LOW (ref 39.0–52.0)
Hemoglobin: 10.6 g/dL — ABNORMAL LOW (ref 13.0–17.0)
Immature Granulocytes: 1 %
Lymphocytes Relative: 7 %
Lymphs Abs: 0.3 10*3/uL — ABNORMAL LOW (ref 0.7–4.0)
MCH: 26.2 pg (ref 26.0–34.0)
MCHC: 31.7 g/dL (ref 30.0–36.0)
MCV: 82.7 fL (ref 80.0–100.0)
Monocytes Absolute: 0.4 10*3/uL (ref 0.1–1.0)
Monocytes Relative: 9 %
Neutro Abs: 3.7 10*3/uL (ref 1.7–7.7)
Neutrophils Relative %: 80 %
Platelets: 175 10*3/uL (ref 150–400)
RBC: 4.04 MIL/uL — ABNORMAL LOW (ref 4.22–5.81)
RDW: 15.9 % — ABNORMAL HIGH (ref 11.5–15.5)
WBC: 4.6 10*3/uL (ref 4.0–10.5)
nRBC: 0 % (ref 0.0–0.2)

## 2018-11-29 LAB — COMPREHENSIVE METABOLIC PANEL
ALT: 35 U/L (ref 0–44)
AST: 44 U/L — ABNORMAL HIGH (ref 15–41)
Albumin: 4.1 g/dL (ref 3.5–5.0)
Alkaline Phosphatase: 64 U/L (ref 38–126)
Anion gap: 14 (ref 5–15)
BUN: 41 mg/dL — ABNORMAL HIGH (ref 8–23)
CO2: 20 mmol/L — ABNORMAL LOW (ref 22–32)
Calcium: 10 mg/dL (ref 8.9–10.3)
Chloride: 106 mmol/L (ref 98–111)
Creatinine, Ser: 1.52 mg/dL — ABNORMAL HIGH (ref 0.61–1.24)
GFR calc Af Amer: 48 mL/min — ABNORMAL LOW (ref >60)
GFR calc non Af Amer: 41 mL/min — ABNORMAL LOW (ref >60)
Glucose, Bld: 123 mg/dL — ABNORMAL HIGH (ref 70–99)
Potassium: 4.3 mmol/L (ref 3.5–5.1)
Sodium: 140 mmol/L (ref 135–145)
Total Bilirubin: 0.4 mg/dL (ref 0.3–1.2)
Total Protein: 6.8 g/dL (ref 6.5–8.1)

## 2018-11-29 LAB — LACTATE DEHYDROGENASE: LDH: 129 U/L (ref 98–192)

## 2018-11-29 MED ORDER — SODIUM CHLORIDE 0.9 % IJ SOLN: 10.0000 mL | INTRAMUSCULAR | Status: DC | PRN

## 2018-11-29 MED ORDER — SODIUM CHLORIDE 0.9% FLUSH
10.0000 mL | Freq: Once | INTRAVENOUS | Status: AC
  Administered 2018-11-29: 10 mL
  Filled 2018-11-29: qty 10

## 2018-11-29 MED ORDER — HEPARIN SOD (PORK) LOCK FLUSH 100 UNIT/ML IV SOLN
500.0000 [IU] | Freq: Once | INTRAVENOUS | Status: AC | PRN
  Administered 2018-11-29: 500 [IU]
  Filled 2018-11-29: qty 5

## 2018-11-29 MED ORDER — SODIUM CHLORIDE 0.9 % IV SOLN
Freq: Once | INTRAVENOUS | Status: AC
  Administered 2018-11-29: 09:00:00 via INTRAVENOUS
  Filled 2018-11-29: qty 250

## 2018-11-29 MED ORDER — DIPHENHYDRAMINE HCL 25 MG PO CAPS
25.0000 mg | ORAL_CAPSULE | Freq: Once | ORAL | Status: AC
  Administered 2018-11-29: 25 mg via ORAL

## 2018-11-29 MED ORDER — SODIUM CHLORIDE 0.9% FLUSH
10.0000 mL | INTRAVENOUS | Status: DC | PRN
  Administered 2018-11-29: 12:00:00 10 mL
  Filled 2018-11-29: qty 10

## 2018-11-29 MED ORDER — SODIUM CHLORIDE 0.9 % IV SOLN
350.0000 mg/m2 | Freq: Once | INTRAVENOUS | Status: AC
  Administered 2018-11-29: 700 mg via INTRAVENOUS
  Filled 2018-11-29: qty 50

## 2018-11-29 MED ORDER — DIPHENHYDRAMINE HCL 25 MG PO CAPS
ORAL_CAPSULE | ORAL | Status: AC
  Filled 2018-11-29: qty 1

## 2018-11-29 MED ORDER — ACETAMINOPHEN 325 MG PO TABS
ORAL_TABLET | ORAL | Status: AC
  Filled 2018-11-29: qty 2

## 2018-11-29 MED ORDER — ACETAMINOPHEN 325 MG PO TABS
650.0000 mg | ORAL_TABLET | Freq: Once | ORAL | Status: AC
  Administered 2018-11-29: 09:00:00 650 mg via ORAL

## 2018-11-29 NOTE — Progress Notes (Signed)
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 [phone]   Addendum: I spoke to AC's daughter [name] in the afternoon after the appointment.  I had tried to call her twice and couldn't get through.  I reviewed with her the concerns of the weight loss, the order for a pet scan, and then decision about consultation with GI regarding diarrhea.  She is in agreement.

## 2018-11-29 NOTE — Patient Instructions (Signed)
[facility] Discharge Instructions for Patients Receiving Chemotherapy  Today you received the following chemotherapy agents:  Rituxan   To help prevent nausea and vomiting after your treatment, we encourage you to take your nausea medication as prescribed.   If you develop nausea and vomiting that is not controlled by your nausea medication, call the clinic.   BELOW ARE SYMPTOMS THAT SHOULD BE REPORTED IMMEDIATELY:  *FEVER GREATER THAN 100.5 F  *CHILLS WITH OR WITHOUT FEVER  NAUSEA AND VOMITING THAT IS NOT CONTROLLED WITH YOUR NAUSEA MEDICATION  *UNUSUAL [name]  *UNUSUAL BRUISING OR BLEEDING  TENDERNESS IN MOUTH AND THROAT WITH OR WITHOUT PRESENCE OF ULCERS  *URINARY PROBLEMS  *BOWEL PROBLEMS  UNUSUAL RASH Items with * indicate a potential emergency and should be followed up as soon as possible.  Feel free to call the clinic should you have any questions or concerns. The clinic phone number is [phone].  Please show the CHEMO ALERT CARD at check-in to the Emergency Department and triage nurse.   

## 2018-11-30 ENCOUNTER — Encounter: Payer: Self-pay | Admitting: Podiatry

## 2018-11-30 ENCOUNTER — Ambulatory Visit (INDEPENDENT_AMBULATORY_CARE_PROVIDER_SITE_OTHER): Payer: Medicare Other | Admitting: Podiatry

## 2018-11-30 ENCOUNTER — Ambulatory Visit: Payer: Medicare Other | Admitting: Orthotics

## 2018-11-30 VITALS — Temp 98.2°F

## 2018-11-30 DIAGNOSIS — L84 Corns and callosities: Secondary | ICD-10-CM | POA: Diagnosis not present

## 2018-11-30 DIAGNOSIS — E1149 Type 2 diabetes mellitus with other diabetic neurological complication: Secondary | ICD-10-CM

## 2018-11-30 DIAGNOSIS — M14671 Charcot's joint, right ankle and foot: Secondary | ICD-10-CM
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
## 2018-12-05 NOTE — Progress Notes (Signed)

## 2018-12-10 DIAGNOSIS — N183 Chronic kidney disease, stage 3 (moderate): Secondary | ICD-10-CM | POA: Diagnosis not present

## 2018-12-10 DIAGNOSIS — E1165 Type 2 diabetes mellitus with hyperglycemia: Secondary | ICD-10-CM | POA: Diagnosis not present

## 2018-12-10 DIAGNOSIS — C8590 Non-Hodgkin lymphoma, unspecified, unspecified site: Secondary | ICD-10-CM | POA: Diagnosis not present

## 2018-12-10 DIAGNOSIS — D649 Anemia, unspecified: Secondary | ICD-10-CM | POA: Diagnosis not present

## 2018-12-10 DIAGNOSIS — I10 Essential (primary) hypertension: Secondary | ICD-10-CM | POA: Diagnosis not present

## 2018-12-10 DIAGNOSIS — E1121 Type 2 diabetes mellitus with diabetic nephropathy: Secondary | ICD-10-CM | POA: Diagnosis not present

## 2018-12-10 DIAGNOSIS — E782 Mixed hyperlipidemia: Secondary | ICD-10-CM | POA: Diagnosis not present

## 2018-12-10 DIAGNOSIS — R3129 Other microscopic hematuria: Secondary | ICD-10-CM | POA: Diagnosis not present

## 2018-12-10 DIAGNOSIS — C61 Malignant neoplasm of prostate: Secondary | ICD-10-CM | POA: Diagnosis not present

## 2018-12-11 ENCOUNTER — Telehealth: Payer: Self-pay | Admitting: *Deleted

## 2018-12-11 ENCOUNTER — Inpatient Hospital Stay (HOSPITAL_COMMUNITY)
Admission: EM | Admit: 2018-12-11 | Discharge: 2018-12-13 | DRG: 638 | Disposition: A | Discharge status: Home / Self Care | Payer: Medicare Other | Attending: Internal Medicine | Admitting: Internal Medicine

## 2018-12-11 ENCOUNTER — Other Ambulatory Visit: Payer: Self-pay

## 2018-12-11 ENCOUNTER — Encounter: Payer: Self-pay | Admitting: Podiatry

## 2018-12-11 ENCOUNTER — Ambulatory Visit (INDEPENDENT_AMBULATORY_CARE_PROVIDER_SITE_OTHER): Payer: Medicare Other

## 2018-12-11 ENCOUNTER — Encounter (HOSPITAL_COMMUNITY): Payer: Self-pay | Admitting: Emergency Medicine

## 2018-12-11 ENCOUNTER — Ambulatory Visit (INDEPENDENT_AMBULATORY_CARE_PROVIDER_SITE_OTHER): Payer: Medicare Other | Admitting: Podiatry

## 2018-12-11 VITALS — Temp 99.1°F

## 2018-12-11 DIAGNOSIS — Z66 Do not resuscitate: Secondary | ICD-10-CM | POA: Diagnosis present

## 2018-12-11 DIAGNOSIS — H9190 Unspecified hearing loss, unspecified ear: Secondary | ICD-10-CM | POA: Diagnosis present

## 2018-12-11 DIAGNOSIS — I2510 Atherosclerotic heart disease of native coronary artery without angina pectoris: Secondary | ICD-10-CM | POA: Diagnosis present

## 2018-12-11 DIAGNOSIS — Z87891 Personal history of nicotine dependence: Secondary | ICD-10-CM

## 2018-12-11 DIAGNOSIS — E11610 Type 2 diabetes mellitus with diabetic neuropathic arthropathy: Secondary | ICD-10-CM | POA: Diagnosis present

## 2018-12-11 DIAGNOSIS — Z85828 Personal history of other malignant neoplasm of skin: Secondary | ICD-10-CM

## 2018-12-11 DIAGNOSIS — L02619 Cutaneous abscess of unspecified foot: Secondary | ICD-10-CM

## 2018-12-11 DIAGNOSIS — E1129 Type 2 diabetes mellitus with other diabetic kidney complication: Secondary | ICD-10-CM | POA: Diagnosis present

## 2018-12-11 DIAGNOSIS — Z8505 Personal history of malignant neoplasm of liver: Secondary | ICD-10-CM

## 2018-12-11 DIAGNOSIS — E11628 Type 2 diabetes mellitus with other skin complications: Principal | ICD-10-CM | POA: Diagnosis present

## 2018-12-11 DIAGNOSIS — Z8546 Personal history of malignant neoplasm of prostate: Secondary | ICD-10-CM

## 2018-12-11 DIAGNOSIS — L84 Corns and callosities: Secondary | ICD-10-CM | POA: Diagnosis not present

## 2018-12-11 DIAGNOSIS — Z7901 Long term (current) use of anticoagulants: Secondary | ICD-10-CM

## 2018-12-11 DIAGNOSIS — K8689 Other specified diseases of pancreas: Secondary | ICD-10-CM | POA: Diagnosis present

## 2018-12-11 DIAGNOSIS — L0390 Cellulitis, unspecified: Secondary | ICD-10-CM | POA: Diagnosis present

## 2018-12-11 DIAGNOSIS — E11621 Type 2 diabetes mellitus with foot ulcer: Secondary | ICD-10-CM | POA: Diagnosis present

## 2018-12-11 DIAGNOSIS — F419 Anxiety disorder, unspecified: Secondary | ICD-10-CM | POA: Diagnosis present

## 2018-12-11 DIAGNOSIS — C8590 Non-Hodgkin lymphoma, unspecified, unspecified site: Secondary | ICD-10-CM | POA: Diagnosis present

## 2018-12-11 DIAGNOSIS — E1140 Type 2 diabetes mellitus with diabetic neuropathy, unspecified: Secondary | ICD-10-CM | POA: Diagnosis present

## 2018-12-11 DIAGNOSIS — Z20828 Contact with and (suspected) exposure to other viral communicable diseases: Secondary | ICD-10-CM | POA: Diagnosis present

## 2018-12-11 DIAGNOSIS — Z8249 Family history of ischemic heart disease and other diseases of the circulatory system: Secondary | ICD-10-CM

## 2018-12-11 DIAGNOSIS — Z03818 Encounter for observation for suspected exposure to other biological agents ruled out: Secondary | ICD-10-CM | POA: Diagnosis not present

## 2018-12-11 DIAGNOSIS — K219 Gastro-esophageal reflux disease without esophagitis: Secondary | ICD-10-CM | POA: Diagnosis present

## 2018-12-11 DIAGNOSIS — L97512 Non-pressure chronic ulcer of other part of right foot with fat layer exposed: Secondary | ICD-10-CM | POA: Diagnosis not present

## 2018-12-11 DIAGNOSIS — L97519 Non-pressure chronic ulcer of other part of right foot with unspecified severity: Secondary | ICD-10-CM | POA: Diagnosis present

## 2018-12-11 DIAGNOSIS — I480 Paroxysmal atrial fibrillation: Secondary | ICD-10-CM | POA: Diagnosis present

## 2018-12-11 DIAGNOSIS — Z79899 Other long term (current) drug therapy: Secondary | ICD-10-CM

## 2018-12-11 DIAGNOSIS — Z794 Long term (current) use of insulin: Secondary | ICD-10-CM | POA: Diagnosis not present

## 2018-12-11 DIAGNOSIS — I129 Hypertensive chronic kidney disease with stage 1 through stage 4 chronic kidney disease, or unspecified chronic kidney disease: Secondary | ICD-10-CM | POA: Diagnosis present

## 2018-12-11 DIAGNOSIS — L03119 Cellulitis of unspecified part of limb: Secondary | ICD-10-CM | POA: Diagnosis not present

## 2018-12-11 DIAGNOSIS — E782 Mixed hyperlipidemia: Secondary | ICD-10-CM | POA: Diagnosis not present

## 2018-12-11 DIAGNOSIS — Z87442 Personal history of urinary calculi: Secondary | ICD-10-CM

## 2018-12-11 DIAGNOSIS — E1122 Type 2 diabetes mellitus with diabetic chronic kidney disease: Secondary | ICD-10-CM | POA: Diagnosis present

## 2018-12-11 DIAGNOSIS — E1142 Type 2 diabetes mellitus with diabetic polyneuropathy: Secondary | ICD-10-CM | POA: Diagnosis not present

## 2018-12-11 DIAGNOSIS — D509 Iron deficiency anemia, unspecified: Secondary | ICD-10-CM | POA: Diagnosis present

## 2018-12-11 DIAGNOSIS — E0822 Diabetes mellitus due to underlying condition with diabetic chronic kidney disease: Secondary | ICD-10-CM | POA: Diagnosis not present

## 2018-12-11 DIAGNOSIS — I10 Essential (primary) hypertension: Secondary | ICD-10-CM | POA: Diagnosis not present

## 2018-12-11 DIAGNOSIS — G4733 Obstructive sleep apnea (adult) (pediatric): Secondary | ICD-10-CM | POA: Diagnosis present

## 2018-12-11 DIAGNOSIS — N183 Chronic kidney disease, stage 3 unspecified: Secondary | ICD-10-CM | POA: Diagnosis present

## 2018-12-11 DIAGNOSIS — E119 Type 2 diabetes mellitus without complications: Secondary | ICD-10-CM

## 2018-12-11 DIAGNOSIS — L03031 Cellulitis of right toe: Secondary | ICD-10-CM | POA: Diagnosis not present

## 2018-12-11 DIAGNOSIS — L03115 Cellulitis of right lower limb: Secondary | ICD-10-CM | POA: Diagnosis not present

## 2018-12-11 DIAGNOSIS — I25118 Atherosclerotic heart disease of native coronary artery with other forms of angina pectoris: Secondary | ICD-10-CM | POA: Diagnosis not present

## 2018-12-11 DIAGNOSIS — Z9079 Acquired absence of other genital organ(s): Secondary | ICD-10-CM

## 2018-12-11 LAB — CBC WITH DIFFERENTIAL/PLATELET
Abs Immature Granulocytes: 0.05 10*3/uL (ref 0.00–0.07)
Basophils Absolute: 0 10*3/uL (ref 0.0–0.1)
Basophils Relative: 0 %
Eosinophils Absolute: 0.1 10*3/uL (ref 0.0–0.5)
Eosinophils Relative: 2 %
HCT: 31.9 % — ABNORMAL LOW (ref 39.0–52.0)
Hemoglobin: 10.1 g/dL — ABNORMAL LOW (ref 13.0–17.0)
Immature Granulocytes: 1 %
Lymphocytes Relative: 5 %
Lymphs Abs: 0.3 10*3/uL — ABNORMAL LOW (ref 0.7–4.0)
MCH: 26.7 pg (ref 26.0–34.0)
MCHC: 31.7 g/dL (ref 30.0–36.0)
MCV: 84.4 fL (ref 80.0–100.0)
Monocytes Absolute: 0.6 10*3/uL (ref 0.1–1.0)
Monocytes Relative: 8 %
Neutro Abs: 6 10*3/uL (ref 1.7–7.7)
Neutrophils Relative %: 84 %
Platelets: 181 10*3/uL (ref 150–400)
RBC: 3.78 MIL/uL — ABNORMAL LOW (ref 4.22–5.81)
RDW: 16 % — ABNORMAL HIGH (ref 11.5–15.5)
WBC: 7.1 10*3/uL (ref 4.0–10.5)
nRBC: 0 % (ref 0.0–0.2)

## 2018-12-11 LAB — COMPREHENSIVE METABOLIC PANEL
ALT: 29 U/L (ref 0–44)
AST: 34 U/L (ref 15–41)
Albumin: 4.1 g/dL (ref 3.5–5.0)
Alkaline Phosphatase: 64 U/L (ref 38–126)
Anion gap: 11 (ref 5–15)
BUN: 33 mg/dL — ABNORMAL HIGH (ref 8–23)
CO2: 22 mmol/L (ref 22–32)
Calcium: 9.5 mg/dL (ref 8.9–10.3)
Chloride: 100 mmol/L (ref 98–111)
Creatinine, Ser: 1.31 mg/dL — ABNORMAL HIGH (ref 0.61–1.24)
GFR calc Af Amer: 58 mL/min — ABNORMAL LOW (ref >60)
GFR calc non Af Amer: 50 mL/min — ABNORMAL LOW (ref >60)
Glucose, Bld: 176 mg/dL — ABNORMAL HIGH (ref 70–99)
Potassium: 4 mmol/L (ref 3.5–5.1)
Sodium: 133 mmol/L — ABNORMAL LOW (ref 135–145)
Total Bilirubin: 0.2 mg/dL — ABNORMAL LOW (ref 0.3–1.2)
Total Protein: 6.8 g/dL (ref 6.5–8.1)

## 2018-12-11 LAB — SARS CORONAVIRUS 2 BY RT PCR (HOSPITAL ORDER, PERFORMED IN ~~LOC~~ HOSPITAL LAB): SARS Coronavirus 2: NEGATIVE

## 2018-12-11 LAB — PROTIME-INR
INR: 1.6 — ABNORMAL HIGH (ref 0.8–1.2)
Prothrombin Time: 18.7 seconds — ABNORMAL HIGH (ref 11.4–15.2)

## 2018-12-11 LAB — LACTIC ACID, PLASMA
Lactic Acid, Venous: 1.1 mmol/L (ref 0.5–1.9)
Lactic Acid, Venous: 1.1 mmol/L (ref 0.5–1.9)

## 2018-12-11 LAB — GLUCOSE, CAPILLARY: Glucose-Capillary: 249 mg/dL — ABNORMAL HIGH (ref 70–99)

## 2018-12-11 MED ORDER — SODIUM CHLORIDE 0.9% FLUSH
3.0000 mL | Freq: Two times a day (BID) | INTRAVENOUS | Status: DC
  Administered 2018-12-11 – 2018-12-13 (×2): 3 mL via INTRAVENOUS

## 2018-12-11 MED ORDER — PIPERACILLIN-TAZOBACTAM 3.375 G IVPB
3.3750 g | Freq: Three times a day (TID) | INTRAVENOUS | Status: DC
  Administered 2018-12-12 – 2018-12-13 (×4): 3.375 g via INTRAVENOUS
  Filled 2018-12-11 (×4): qty 50

## 2018-12-11 MED ORDER — PIPERACILLIN-TAZOBACTAM 3.375 G IVPB 30 MIN
3.3750 g | Freq: Once | INTRAVENOUS | Status: AC
  Administered 2018-12-11: 3.375 g via INTRAVENOUS
  Filled 2018-12-11: qty 50

## 2018-12-11 MED ORDER — ACETAMINOPHEN 325 MG PO TABS: 650.0000 mg | ORAL_TABLET | Freq: Four times a day (QID) | ORAL | Status: DC | PRN

## 2018-12-11 MED ORDER — SODIUM CHLORIDE 0.9 % IV SOLN
INTRAVENOUS | Status: DC | PRN
  Administered 2018-12-11: 20:00:00 500 mL via INTRAVENOUS

## 2018-12-11 MED ORDER — VANCOMYCIN HCL IN DEXTROSE 1-5 GM/200ML-% IV SOLN
1000.0000 mg | Freq: Once | INTRAVENOUS | Status: AC
  Administered 2018-12-11: 1000 mg via INTRAVENOUS
  Filled 2018-12-11: qty 200

## 2018-12-11 MED ORDER — SERTRALINE HCL 100 MG PO TABS
100.0000 mg | ORAL_TABLET | Freq: Every day | ORAL | Status: DC
  Administered 2018-12-12 – 2018-12-13 (×2): 100 mg via ORAL
  Filled 2018-12-11 (×2): qty 1

## 2018-12-11 MED ORDER — INSULIN ASPART 100 UNIT/ML ~~LOC~~ SOLN
0.0000 [IU] | Freq: Every day | SUBCUTANEOUS | Status: DC
  Administered 2018-12-11 – 2018-12-12 (×2): 2 [IU] via SUBCUTANEOUS
  Filled 2018-12-11: qty 0.05

## 2018-12-11 MED ORDER — LORATADINE 10 MG PO TABS
10.0000 mg | ORAL_TABLET | Freq: Every day | ORAL | Status: DC
  Administered 2018-12-12 – 2018-12-13 (×2): 10 mg via ORAL
  Filled 2018-12-11 (×2): qty 1

## 2018-12-11 MED ORDER — DICLOFENAC SODIUM 1 % TD GEL: 2.0000 g | Freq: Three times a day (TID) | TRANSDERMAL | Status: DC | PRN

## 2018-12-11 MED ORDER — ACETAMINOPHEN 650 MG RE SUPP: 650.0000 mg | Freq: Four times a day (QID) | RECTAL | Status: DC | PRN

## 2018-12-11 MED ORDER — INSULIN ASPART 100 UNIT/ML ~~LOC~~ SOLN
0.0000 [IU] | Freq: Three times a day (TID) | SUBCUTANEOUS | Status: DC
  Administered 2018-12-12 – 2018-12-13 (×4): 2 [IU] via SUBCUTANEOUS
  Filled 2018-12-11: qty 0.09

## 2018-12-11 MED ORDER — MAGNESIUM OXIDE 400 (241.3 MG) MG PO TABS
400.0000 mg | ORAL_TABLET | Freq: Two times a day (BID) | ORAL | Status: DC
  Administered 2018-12-12 – 2018-12-13 (×4): 400 mg via ORAL
  Filled 2018-12-11 (×4): qty 1

## 2018-12-11 MED ORDER — ALLOPURINOL 300 MG PO TABS
300.0000 mg | ORAL_TABLET | Freq: Every day | ORAL | Status: DC
  Administered 2018-12-12 – 2018-12-13 (×2): 300 mg via ORAL
  Filled 2018-12-11 (×2): qty 1

## 2018-12-11 MED ORDER — LATANOPROST 0.005 % OP SOLN
1.0000 [drp] | Freq: Every day | OPHTHALMIC | Status: DC
  Administered 2018-12-12 (×2): 1 [drp] via OPHTHALMIC
  Filled 2018-12-11: qty 2.5

## 2018-12-11 MED ORDER — ATORVASTATIN CALCIUM 20 MG PO TABS
20.0000 mg | ORAL_TABLET | Freq: Every evening | ORAL | Status: DC
  Administered 2018-12-12: 20 mg via ORAL
  Filled 2018-12-11: qty 1

## 2018-12-11 MED ORDER — GABAPENTIN 300 MG PO CAPS
300.0000 mg | ORAL_CAPSULE | Freq: Three times a day (TID) | ORAL | Status: DC
  Administered 2018-12-12 – 2018-12-13 (×5): 300 mg via ORAL
  Filled 2018-12-11 (×5): qty 1

## 2018-12-11 MED ORDER — SODIUM CHLORIDE 0.9% FLUSH: 3.0000 mL | INTRAVENOUS | Status: DC | PRN

## 2018-12-11 MED ORDER — CEPHALEXIN 500 MG PO CAPS: 500.0000 mg | ORAL_CAPSULE | Freq: Two times a day (BID) | ORAL | 0 refills | Status: DC

## 2018-12-11 MED ORDER — METFORMIN HCL ER 500 MG PO TB24
500.0000 mg | ORAL_TABLET | Freq: Two times a day (BID) | ORAL | Status: DC
  Filled 2018-12-11 (×2): qty 1

## 2018-12-11 MED ORDER — RIVAROXABAN 15 MG PO TABS
15.0000 mg | ORAL_TABLET | Freq: Every day | ORAL | Status: DC
  Administered 2018-12-12 – 2018-12-13 (×2): 15 mg via ORAL
  Filled 2018-12-11 (×2): qty 1

## 2018-12-11 MED ORDER — PANCRELIPASE (LIP-PROT-AMYL) 12000-38000 UNITS PO CPEP
12000.0000 [IU] | ORAL_CAPSULE | Freq: Three times a day (TID) | ORAL | Status: DC
  Administered 2018-12-12 – 2018-12-13 (×5): 12000 [IU] via ORAL
  Filled 2018-12-11 (×5): qty 1

## 2018-12-11 MED ORDER — VANCOMYCIN HCL IN DEXTROSE 1-5 GM/200ML-% IV SOLN
1000.0000 mg | INTRAVENOUS | Status: DC
  Administered 2018-12-12 – 2018-12-13 (×2): 1000 mg via INTRAVENOUS
  Filled 2018-12-11 (×2): qty 200

## 2018-12-11 MED ORDER — SODIUM CHLORIDE 0.9 % IV SOLN: 250.0000 mL | INTRAVENOUS | Status: DC | PRN

## 2018-12-11 MED ORDER — SODIUM CHLORIDE 0.9 % IV SOLN
INTRAVENOUS | Status: AC
  Administered 2018-12-11: via INTRAVENOUS

## 2018-12-11 MED ORDER — FENOFIBRATE 160 MG PO TABS
160.0000 mg | ORAL_TABLET | Freq: Every evening | ORAL | Status: DC
  Administered 2018-12-12: 160 mg via ORAL
  Filled 2018-12-11 (×2): qty 1

## 2018-12-11 MED ORDER — MUPIROCIN 2 % EX OINT
1.0000 "application " | TOPICAL_OINTMENT | Freq: Every day | CUTANEOUS | Status: DC
  Administered 2018-12-12 – 2018-12-13 (×2): 1 via TOPICAL
  Filled 2018-12-11: qty 22

## 2018-12-11 NOTE — Progress Notes (Addendum)
Subjective: 85 year old male presents the office today with his daughter for an acute appointment given wound, drainage, cellulitis.  His daughter checked the foot on Sunday and he was having no issues no coughing.  Last night, started having some sharp pain in this afternoon his daughter looked up her foot noticed the wound and the redness and drainage. His daughter reports that he has bene on his feet a lot and been working in the garden and outside more recently. Denies any systemic complaints such as fevers, chills, nausea, vomiting. No acute changes since last appointment, and no other complaints at this time.   Objective: AAO x3, NAD Chronic Charcot changes are present.  On plantar medial aspect of foot.  Callus he had previously there is what appears to be callus with some blister formation on the area and there is increased erythema.  There is fluctuation.  I did debride the areas on occlusions expressed there is no purulence.  There is no probing to bone. No pain with calf compression, swelling, warmth, erythema  Assessment: Cellulitis right foot with ulceration  Plan: -All treatment options discussed with the patient including all alternatives, risks, complications.  -X-rays obtained reviewed.  Chronic Charcot changes present.  No soft tissue emphysema identified with osteomyelitis. -Given his history of cellulitis and how rapidly this progressed I do recommend admission to the hospital for IV antibiotics.  [facility] to inform them of his arrival. His daughter is with him today and they are in agreement to going to the ER. -Wound culture obtained  -Patient encouraged to call the office with any questions, concerns, change in symptoms.   *I will be happy to follow him in the hospital if needed. I can be reached at [phone] or the office at [place] DPM

## 2018-12-11 NOTE — Progress Notes (Signed)
A consult was received from an ED physician for vanc per pharmacy dosing.  The patient's profile has been reviewed for ht/wt/allergies/indication/available labs.   A one time order has been placed for vanc 1g.  Further antibiotics/pharmacy consults should be ordered by admitting physician if indicated.                       Thank you, Kara Mead 12/11/2018  8:17 PM
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
## 2018-12-11 NOTE — ED Notes (Signed)
Tech took patient upstairs. 

## 2018-12-11 NOTE — Telephone Encounter (Signed)
Called and spoke with Melissa at [facility] and Confirmation number was [id number] and to be picked up today in the drop box. [name]
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
## 2018-12-11 NOTE — Progress Notes (Signed)
Pharmacy Antibiotic and Anticoagulation Note  [name] is a 84 y.o. male admitted on 12/11/2018 with wound infection.  Pharmacy has been consulted for zosyn and vancomycin dosing.  Plan: Zosyn 3.375g IV q8h (4 hour infusion).  Vancomycin 1 Gm IV q24h for est AUC = 520 Goal AUC = 400-550 Daily scr F/u cultures/levels Xarelto 15 mg daily at lunch as PTA  Height: 5\' 8"  (172.7 cm) Weight: 154 lb 5.2 oz (70 kg) IBW/kg (Calculated) : 68.4  Temp (24hrs), Avg:98.6 F (37 C), Min:98.1 F (36.7 C), Max:99.1 F (37.3 C)  Recent Labs  Lab 12/11/18 1840 12/11/18 1940  WBC 7.1  --   CREATININE 1.31*  --   LATICACIDVEN 1.1 1.1    Estimated Creatinine Clearance: 40.6 mL/min (A) (by C-G formula based on SCr of 1.31 mg/dL (H)).    Allergies  Allergen Reactions  . Atenolol Other (See Comments)    "Heart rate slowed- STOPPED on 08/16/2013"  . Niacin Other (See Comments)    headaches    Antimicrobials this admission: 8/18 zosyn >>  8/18 vancomycin >>   Dose adjustments this admission:   Microbiology results:  BCx:   UCx:    Sputum:    MRSA PCR:   Thank you for allowing pharmacy to be a part of this patient's care.  Dorrene German 12/11/2018 11:01 PM

## 2018-12-11 NOTE — ED Triage Notes (Signed)
Pt reports that had infection on right foot for over month and finished them week ago. Today had part of callus cut off (pt reports no pus in it and feels better) but was sent by PCP for IV antibiotics for it being red.

## 2018-12-11 NOTE — Telephone Encounter (Signed)
I called [name] and she states she is in the office now with pt and had thought after she had looked at the time she should call the main number and schedule. [name] thanked me for my call.
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
## 2018-12-11 NOTE — Telephone Encounter (Signed)
Pt's dtr, [name] states ftr had a serious staph infection about a month ago and was also seen by Dr. Lorin Mercy and Dr. Jacqualyn Posey, pt is having trouble with the same spot, swollen with shooting pain and a hard white spot, can send a picture.

## 2018-12-12 DIAGNOSIS — E782 Mixed hyperlipidemia: Secondary | ICD-10-CM

## 2018-12-12 DIAGNOSIS — L03115 Cellulitis of right lower limb: Secondary | ICD-10-CM

## 2018-12-12 DIAGNOSIS — L03031 Cellulitis of right toe: Secondary | ICD-10-CM

## 2018-12-12 LAB — COMPREHENSIVE METABOLIC PANEL
ALT: 25 U/L (ref 0–44)
AST: 27 U/L (ref 15–41)
Albumin: 3.4 g/dL — ABNORMAL LOW (ref 3.5–5.0)
Alkaline Phosphatase: 50 U/L (ref 38–126)
Anion gap: 9 (ref 5–15)
BUN: 26 mg/dL — ABNORMAL HIGH (ref 8–23)
CO2: 21 mmol/L — ABNORMAL LOW (ref 22–32)
Calcium: 8.9 mg/dL (ref 8.9–10.3)
Chloride: 107 mmol/L (ref 98–111)
Creatinine, Ser: 1.19 mg/dL (ref 0.61–1.24)
GFR calc Af Amer: >60 mL/min (ref >60)
GFR calc non Af Amer: 56 mL/min — ABNORMAL LOW (ref >60)
Glucose, Bld: 158 mg/dL — ABNORMAL HIGH (ref 70–99)
Potassium: 3.8 mmol/L (ref 3.5–5.1)
Sodium: 137 mmol/L (ref 135–145)
Total Bilirubin: 0.4 mg/dL (ref 0.3–1.2)
Total Protein: 5.9 g/dL — ABNORMAL LOW (ref 6.5–8.1)

## 2018-12-12 LAB — CBC
HCT: 28.4 % — ABNORMAL LOW (ref 39.0–52.0)
Hemoglobin: 8.9 g/dL — ABNORMAL LOW (ref 13.0–17.0)
MCH: 26.5 pg (ref 26.0–34.0)
MCHC: 31.3 g/dL (ref 30.0–36.0)
MCV: 84.5 fL (ref 80.0–100.0)
Platelets: 118 10*3/uL — ABNORMAL LOW (ref 150–400)
RBC: 3.36 MIL/uL — ABNORMAL LOW (ref 4.22–5.81)
RDW: 15.8 % — ABNORMAL HIGH (ref 11.5–15.5)
WBC: 3.6 10*3/uL — ABNORMAL LOW (ref 4.0–10.5)
nRBC: 0 % (ref 0.0–0.2)

## 2018-12-12 LAB — GLUCOSE, CAPILLARY
Glucose-Capillary: 152 mg/dL — ABNORMAL HIGH (ref 70–99)
Glucose-Capillary: 163 mg/dL — ABNORMAL HIGH (ref 70–99)
Glucose-Capillary: 107 mg/dL — ABNORMAL HIGH (ref 70–99)
Glucose-Capillary: 238 mg/dL — ABNORMAL HIGH (ref 70–99)

## 2018-12-12 LAB — SEDIMENTATION RATE: Sed Rate: 29 mm/hr — ABNORMAL HIGH (ref 0–16)

## 2018-12-12 MED ORDER — SODIUM CHLORIDE 0.9% FLUSH: 10.0000 mL | INTRAVENOUS | Status: DC | PRN
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
## 2018-12-12 NOTE — Consult Note (Signed)
[facility] Nurse wound consult note Consultation completed with collaboration with podiatry and review of chart and images.  Reason for Consult: cellulitis with foot ulceration in the presence of DM Wound type: foot ulceration with chronic charcot changes  Followed by Dr. Jacqualyn Posey  Pressure Injury POA: NA Measurement: see nursing flowsheet Wound bed: callused area with blister formation, fluctuation (per podiatry note 12/11/18) Drainage (amount, consistency, odor) none Periwound: erythema  Dressing procedure/placement/frequency: Add daily betadine application; antiseptic and to keep area dry.  Top with dry dressing.  Podiatry planning to follow while inpatient; I have notified hospitalist of same   Re consult if needed, will not follow at this time. Thanks  Joscelyn Hardrick [employer], RN,CWOCN, CNS, [place] [phone])

## 2018-12-12 NOTE — Progress Notes (Signed)
PROGRESS NOTE    [name]  BWG:[id number] DOB: 1931-09-10 DOA: 12/11/2018 PCP: Michael Pretty, MD    Brief Narrative:  HPI per Dr. Hayes Ludwig [name]  is a 85 y.o. male,   w hypertension, hyperlipidemia, Dm2, Pafib, CAD , Iron deficiency anemia, hx of primary hepatic non-Hodgkins lymphoma (liver biopsy 2006), retroperitoneal recurrence 2014, s/p RICE 2015, s/p Rituxan 2015, prostate cancer stage 4, (Gleason9),  w h/o blister and cellulitis of the right foot on 11/05/2018,  Pt was seen by Celesta Gentile from podiatry on day of admission, and sent to ER for evaluation of cellulitis with ulceration.     Review of prior wound culture and blood culture on 11/05/2018 => coag negative staphylococcus aureus. Pt was discharged on 11/09/2018 w keflex.    In ED,  T 98.5, P 71, R 14, Bp 141/71  Pox 97% on RA  Wbc 7.1, hgb 10.1, Plt 181 Na 133, K 4.0, Bun 33, Creatinine 1.31 Ast 34, Alt 29,  Lactic acid 1.1 INR 1.6  Blood culture x2  Pt given vanco iv, zosyn iv in ED,   Pt will be admitted for cellulitis.    Assessment & Plan:   Principal Problem:   Cellulitis Active Problems:   Mixed hyperlipidemia   Diabetes mellitus with renal manifestations, controlled (HCC)   CKD (chronic kidney disease) stage 3, GFR 30-59 ml/min (HCC)   CAD (coronary artery disease)   Essential hypertension   Iron deficiency anemia   Type II diabetes mellitus ([place])  1 right foot cellulitis with ulceration Patient sent to the ED by his podiatrist due to worsening right foot cellulitis with ulceration.  Dietary note that patient with complaints of sharp pain in his foot on the afternoon of admission and it was noted that his foot will wound had some redness and drainage.  It was noted that patient had been a lot on his feet working in the garden outside more recently.  Patient denied any fevers, chills, nausea or vomiting.  Patient was assessed by podiatry on day of admission and noted to have chronic  Charcot changes on the plantar medial aspect of the foot, callus which had previously been there had some blister formation and increased erythema.  Area was debrided by podiatry per note with no purulence noted.  No probing to the bone.  Patient currently on IV vancomycin and IV Zosyn.  Patient seen by wound care nurse who discussed with podiatry who stated they will follow the patient while inpatient.  Awaiting podiatry evaluation.  Follow for now.  2.  Diabetes mellitus type 2 with diabetic neuropathy Hemoglobin A1c 6.7 on 09/11/2018.  CBG of 152 this morning.  Discontinue metformin.  Continue sliding scale insulin.  Continue gabapentin.  Follow.  3.  Pancreatic insufficiency Continue Creon.  4.  Hyperlipidemia Continue Lipitor and fenofibrate.  5.  Paroxysmal atrial fibrillation Currently rate controlled.  Xarelto for anticoagulation.  6.  Non-Hodgkin's lymphoma Continue allopurinol daily.  Rituximab as outpatient.  7.  Anxiety Stable.  Continue Zoloft.  8.  History of metastatic prostate cancer Outpatient follow-up with oncology.   DVT prophylaxis: Xarelto Code Status: DNR Family Communication: Updated patient.  No family at bedside. Disposition Plan: Likely home when clinically improved.   Consultants:   Podiatry pending  Procedures:   Plain films of the right foot pending 12/11/2018  Antimicrobials:   IV Zosyn 12/11/2018  IV vancomycin 12/11/2018   Subjective: Patient laying in bed.  States he is feeling better than on  admission.  States right foot just got bandaged.  Seen by wound care nurse.  Denies any chest pain or shortness of breath.  Objective: Vitals:   12/11/18 2217 12/11/18 2223 12/12/18 0504 12/12/18 1304  BP:  110/76 99/60 123/63  Pulse:  81 81 62  Resp:  20 18 16   Temp:  98.1 F (36.7 C) 98.6 F (37 C) 98.1 F (36.7 C)  TempSrc:  Oral Oral Oral  SpO2:  98% 97% 99%  Weight: 70 kg     Height: 5\' 8"  (1.727 m)       Intake/Output Summary  (Last 24 hours) at 12/12/2018 1305 Last data filed at 12/12/2018 1245 Gross per 24 hour  Intake 1811.91 ml  Output 2925 ml  Net -1113.09 ml   Filed Weights   12/11/18 2217  Weight: 70 kg    Examination:  General exam: NAD  Respiratory system: Clear to auscultation anterior lung fields. Respiratory effort normal. Cardiovascular system: S1 & S2 heard, RRR. No JVD, murmurs, rubs, gallops or clicks. No pedal edema. Gastrointestinal system: Abdomen is nondistended, soft and nontender. No organomegaly or masses felt. Normal bowel sounds heard. Central nervous system: Alert and oriented. No focal neurological deficits. Extremities: Right lower extremity wrapped in bandage.  Skin: No rashes, lesions or ulcers Psychiatry: Judgement and insight appear normal. Mood & affect appropriate.     Data Reviewed: I have personally reviewed following labs and imaging studies  CBC: Recent Labs  Lab 12/11/18 1840 12/12/18 0512  WBC 7.1 3.6*  NEUTROABS 6.0  --   HGB 10.1* 8.9*  HCT 31.9* 28.4*  MCV 84.4 84.5  PLT 181 956*   Basic Metabolic Panel: Recent Labs  Lab 12/11/18 1840 12/12/18 0512  NA 133* 137  K 4.0 3.8  CL 100 107  CO2 22 21*  GLUCOSE 176* 158*  BUN 33* 26*  CREATININE 1.31* 1.19  CALCIUM 9.5 8.9   GFR: Estimated Creatinine Clearance: 44.7 mL/min (by C-G formula based on SCr of 1.19 mg/dL). Liver Function Tests: Recent Labs  Lab 12/11/18 1840 12/12/18 0512  AST 34 27  ALT 29 25  ALKPHOS 64 50  BILITOT 0.2* 0.4  PROT 6.8 5.9*  ALBUMIN 4.1 3.4*   No results for input(s): LIPASE, AMYLASE in the last 168 hours. No results for input(s): AMMONIA in the last 168 hours. Coagulation Profile: Recent Labs  Lab 12/11/18 1840  INR 1.6*   Cardiac Enzymes: No results for input(s): CKTOTAL, CKMB, CKMBINDEX, TROPONINI in the last 168 hours. BNP (last 3 results) No results for input(s): PROBNP in the last 8760 hours. HbA1C: No results for input(s): HGBA1C in the last  72 hours. CBG: Recent Labs  Lab 12/11/18 2251 12/12/18 0750 12/12/18 1159  GLUCAP 249* 152* 163*   Lipid Profile: No results for input(s): CHOL, HDL, LDLCALC, TRIG, CHOLHDL, LDLDIRECT in the last 72 hours. Thyroid Function Tests: No results for input(s): TSH, T4TOTAL, FREET4, T3FREE, THYROIDAB in the last 72 hours. Anemia Panel: No results for input(s): VITAMINB12, FOLATE, FERRITIN, TIBC, IRON, RETICCTPCT in the last 72 hours. Sepsis Labs: Recent Labs  Lab 12/11/18 1840 12/11/18 1940  LATICACIDVEN 1.1 1.1    Recent Results (from the past 240 hour(s))  Culture, blood (Routine x 2)     Status: None (Preliminary result)   Collection Time: 12/11/18  6:42 PM   Specimen: BLOOD  Result Value Ref Range Status   Specimen Description   Final    BLOOD RIGHT ANTECUBITAL Performed at [facility],  [place] [address] [place], [place] [address]    Special Requests   Final    BOTTLES DRAWN AEROBIC AND ANAEROBIC Blood Culture adequate volume Performed at [facility] [address], [place], [place] [address]    Culture   Final    NO GROWTH < 12 HOURS Performed at [facility] [address], [place], [place] [address]    Report Status PENDING  Incomplete  Culture, blood (Routine x 2)     Status: None (Preliminary result)   Collection Time: 12/11/18  6:47 PM   Specimen: BLOOD  Result Value Ref Range Status   Specimen Description   Final    BLOOD LEFT ANTECUBITAL Performed at [facility] [address], [place], [place] [address]    Special Requests   Final    BOTTLES DRAWN AEROBIC AND ANAEROBIC Blood Culture adequate volume Performed at [facility] [address], [place], [place] [address]    Culture   Final    NO GROWTH < 12 HOURS Performed at [facility] [address] [place], [place] [address]    Report Status PENDING  Incomplete  SARS Coronavirus 2 [facility] order, Performed in  [facility] hospital lab) Nasopharyngeal Nasopharyngeal Swab     Status: None   Collection Time: 12/11/18  9:01 PM   Specimen: Nasopharyngeal Swab  Result Value Ref Range Status   SARS Coronavirus 2 NEGATIVE NEGATIVE Final    Comment: (NOTE) If result is NEGATIVE SARS-CoV-2 target nucleic acids are NOT DETECTED. The SARS-CoV-2 RNA is generally detectable in upper and lower  respiratory specimens during the acute phase of infection. The lowest  concentration of SARS-CoV-2 viral copies this assay can detect is 250  copies / mL. A negative result does not preclude SARS-CoV-2 infection  and should not be used as the sole basis for treatment or other  patient management decisions.  A negative result may occur with  improper specimen collection / handling, submission of specimen other  than nasopharyngeal swab, presence of viral mutation(s) within the  areas targeted by this assay, and inadequate number of viral copies  (<250 copies / mL). A negative result must be combined with clinical  observations, patient history, and epidemiological information. If result is POSITIVE SARS-CoV-2 target nucleic acids are DETECTED. The SARS-CoV-2 RNA is generally detectable in upper and lower  respiratory specimens dur ing the acute phase of infection.  Positive  results are indicative of active infection with SARS-CoV-2.  Clinical  correlation with patient history and other diagnostic information is  necessary to determine patient infection status.  Positive results do  not rule out bacterial infection or co-infection with other viruses. If result is PRESUMPTIVE POSTIVE SARS-CoV-2 nucleic acids MAY BE PRESENT.   A presumptive positive result was obtained on the submitted specimen  and confirmed on repeat testing.  While 2019 novel coronavirus  (SARS-CoV-2) nucleic acids may be present in the submitted sample  additional confirmatory testing may be necessary for epidemiological  and / or clinical  management purposes  to differentiate between  SARS-CoV-2 and other Sarbecovirus currently known to infect humans.  If clinically indicated additional testing with an alternate test  methodology [id number]) is advised. The SARS-CoV-2 RNA is generally  detectable in upper and lower respiratory sp ecimens during the acute  phase of infection. The expected result is Negative. Fact Sheet for Patients:  StrictlyIdeas.no Fact Sheet for Healthcare Providers: BankingDealers.co.za This test is not yet approved  or cleared by the [place] and has been authorized for detection and/or diagnosis of SARS-CoV-2 by FDA under an Emergency Use Authorization (EUA).  This EUA will remain in effect (meaning this test can be used) for the duration of the COVID-19 declaration under Section 564(b)(1) of the Act, 21 U.S.C. section 360bbb-3(b)(1), unless the authorization is terminated or revoked sooner. Performed at [facility], [place] [address] [place], [place] [address]          Radiology Studies: No results found.      Scheduled Meds: . allopurinol  300 mg Oral QPC breakfast  . atorvastatin  20 mg Oral QPM  . fenofibrate  160 mg Oral QPM  . gabapentin  300 mg Oral TID  . insulin aspart  0-5 Units Subcutaneous QHS  . insulin aspart  0-9 Units Subcutaneous TID WC  . latanoprost  1 drop Both Eyes QHS  . lipase/protease/amylase  12,000 Units Oral TID WC  . loratadine  10 mg Oral Daily  . magnesium oxide  400 mg Oral BID  . mupirocin ointment  1 application Topical Daily  . rivaroxaban  15 mg Oral Daily  . sertraline  100 mg Oral QPC breakfast  . sodium chloride flush  3 mL Intravenous Q12H   Continuous Infusions: . sodium chloride Stopped (12/11/18 2235)  . sodium chloride    . piperacillin-tazobactam (ZOSYN)  IV 12.5 mL/hr at 12/12/18 0424  . vancomycin 1,000 mg (12/12/18 0835)     LOS: 1 day    Time spent: 40  minutes    Irine Seal, MD [facility]  If 7PM-7AM, please contact night-coverage [email or web address] 12/12/2018, 1:05 PM

## 2018-12-13 DIAGNOSIS — Z794 Long term (current) use of insulin: Secondary | ICD-10-CM

## 2018-12-13 DIAGNOSIS — L03115 Cellulitis of right lower limb: Secondary | ICD-10-CM

## 2018-12-13 DIAGNOSIS — I25118 Atherosclerotic heart disease of native coronary artery with other forms of angina pectoris: Secondary | ICD-10-CM

## 2018-12-13 DIAGNOSIS — E1142 Type 2 diabetes mellitus with diabetic polyneuropathy: Secondary | ICD-10-CM

## 2018-12-13 LAB — BASIC METABOLIC PANEL
Anion gap: 10 (ref 5–15)
BUN: 28 mg/dL — ABNORMAL HIGH (ref 8–23)
CO2: 22 mmol/L (ref 22–32)
Calcium: 8.9 mg/dL (ref 8.9–10.3)
Chloride: 104 mmol/L (ref 98–111)
Creatinine, Ser: 1.3 mg/dL — ABNORMAL HIGH (ref 0.61–1.24)
GFR calc Af Amer: 58 mL/min — ABNORMAL LOW (ref >60)
GFR calc non Af Amer: 50 mL/min — ABNORMAL LOW (ref >60)
Glucose, Bld: 157 mg/dL — ABNORMAL HIGH (ref 70–99)
Potassium: 3.9 mmol/L (ref 3.5–5.1)
Sodium: 136 mmol/L (ref 135–145)

## 2018-12-13 LAB — CBC WITH DIFFERENTIAL/PLATELET
Abs Immature Granulocytes: 0.02 10*3/uL (ref 0.00–0.07)
Basophils Absolute: 0 10*3/uL (ref 0.0–0.1)
Basophils Relative: 0 %
Eosinophils Absolute: 0.2 10*3/uL (ref 0.0–0.5)
Eosinophils Relative: 6 %
HCT: 31 % — ABNORMAL LOW (ref 39.0–52.0)
Hemoglobin: 9.6 g/dL — ABNORMAL LOW (ref 13.0–17.0)
Immature Granulocytes: 1 %
Lymphocytes Relative: 5 %
Lymphs Abs: 0.2 10*3/uL — ABNORMAL LOW (ref 0.7–4.0)
MCH: 26.6 pg (ref 26.0–34.0)
MCHC: 31 g/dL (ref 30.0–36.0)
MCV: 85.9 fL (ref 80.0–100.0)
Monocytes Absolute: 0.4 10*3/uL (ref 0.1–1.0)
Monocytes Relative: 8 %
Neutro Abs: 3.5 10*3/uL (ref 1.7–7.7)
Neutrophils Relative %: 80 %
Platelets: 151 10*3/uL (ref 150–400)
RBC: 3.61 MIL/uL — ABNORMAL LOW (ref 4.22–5.81)
RDW: 15.9 % — ABNORMAL HIGH (ref 11.5–15.5)
WBC: 4.4 10*3/uL (ref 4.0–10.5)
nRBC: 0 % (ref 0.0–0.2)

## 2018-12-13 LAB — GLUCOSE, CAPILLARY
Glucose-Capillary: 160 mg/dL — ABNORMAL HIGH (ref 70–99)
Glucose-Capillary: 152 mg/dL — ABNORMAL HIGH (ref 70–99)

## 2018-12-13 MED ORDER — HEPARIN SOD (PORK) LOCK FLUSH 100 UNIT/ML IV SOLN
500.0000 [IU] | INTRAVENOUS | Status: AC | PRN
  Administered 2018-12-13: 500 [IU]

## 2018-12-13 MED ORDER — DOXYCYCLINE MONOHYDRATE 100 MG PO TABS: 100.0000 mg | ORAL_TABLET | Freq: Two times a day (BID) | ORAL | 0 refills | Status: AC

## 2018-12-13 MED ORDER — CIPROFLOXACIN HCL 500 MG PO TABS: 500.0000 mg | ORAL_TABLET | Freq: Two times a day (BID) | ORAL | 0 refills | Status: AC

## 2018-12-13 NOTE — Progress Notes (Signed)
Subjective: 85 year old male admitted to hospital for cellulitis in the wound on his right foot.  He is doing well and his pain is resolved.  He has no new concerns. Denies any systemic complaints such as fevers, chills, nausea, vomiting.  Objective: AAO x3, NAD Chronic Charcot changes present.  Hyperkeratotic lesion plantar medial aspect of the right foot.  Substantially improved erythema, edema.  No significant warmth.  There is no fluctuation potation.  There is no malodor.  Overall patient much improved. No open lesions or pre-ulcerative lesions.  No pain with calf compression, swelling, warmth, erythema  Assessment: Resolving erythema, ulceration right foot  Plan: Bandage was changed today.  Infection is much better controlled. Plan to be discharged home and I discussed with Dr. Grandville Silos as well.  We will plan on 10 days of doxycycline, ciprofloxacin.  Continue Betadine with a dry dressing changes.  Discussed the plan with his daughter.  They are comfortable doing the dressing changes at home. Follow up Monday  Celesta Gentile, DPM

## 2018-12-13 NOTE — Discharge Summary (Signed)
Physician Discharge Summary  [name] VQM:[id number] DOB: 1932/02/08 DOA: 12/11/2018  PCP: Michael Pretty, MD  Admit date: 12/11/2018 Discharge date: 12/13/2018  Time spent: 50 minutes  Recommendations for Outpatient Follow-up:  1. Follow-up with Dr. Jacqualyn [name], podiatry on Monday, 12/17/2018.  Wound culture results will need to be followed up upon.   Discharge Diagnoses:  Principal Problem:   Cellulitis Active Problems:   Mixed hyperlipidemia   Diabetes mellitus with renal manifestations, controlled (HCC)   CKD (chronic kidney disease) stage 3, GFR 30-59 ml/min (HCC)   CAD (coronary artery disease)   Essential hypertension   Iron deficiency anemia   Type II diabetes mellitus ([place])   Cellulitis of right foot   Discharge Condition: Stable and improved  Diet recommendation: Carb modified  Filed Weights   12/11/18 2217 12/13/18 0312  Weight: 70 kg 69.4 kg    History of present illness:  HPI per Dr. Hayes Ludwig [name]  is a 85 y.o. male,   w hypertension, hyperlipidemia, Dm2, Pafib, CAD , Iron deficiency anemia, hx of primary hepatic non-Hodgkins lymphoma (liver biopsy 2006), retroperitoneal recurrence 2014, s/p RICE 2015, s/p Rituxan 2015, prostate cancer stage 4, (Gleason9),  w h/o blister and cellulitis of the right foot on 11/05/2018,  Pt was seen by [name] from podiatry today and sent to ER for evaluation of cellulitis with ulceration.     Review of prior wound culture and blood culture on 11/05/2018 => coag negative staphylococcus aureus. Pt was discharged on 11/09/2018 w keflex.    In ED,  T 98.5, P 71, R 14, Bp 141/71  Pox 97% on RA  Wbc 7.1, hgb 10.1, Plt 181 Na 133, K 4.0, Bun 33, Creatinine 1.31 Ast 34, Alt 29,  Lactic acid 1.1 INR 1.6  Blood culture x2  Pt given vanco iv, zosyn iv in ED,   Pt will be admitted for cellulitis.   Hospital Course:  1 right foot cellulitis with ulceration Patient sent to the ED by his podiatrist due to  worsening right foot cellulitis with ulceration.  Dietary note that patient with complaints of sharp pain in his foot on the afternoon of admission and it was noted that his foot will wound had some redness and drainage.  It was noted that patient had been a lot on his feet working in the garden outside more recently.  Patient denied any fevers, chills, nausea or vomiting.  Patient was assessed by podiatry on day of admission and noted to have chronic Charcot changes on the plantar medial aspect of the foot, callus which had previously been there had some blister formation and increased erythema.  Area was debrided by podiatry per note with no purulence noted.  No probing to the bone.  Patient placed empirically on IV vancomycin IV Zosyn during the hospitalization.  Patient was seen by wound care nurse and underwent recommendations for dressing changes.  Patient was also seen by podiatry during the hospitalization.  Patient improved clinically significantly during the hospitalization will be discharged home on 8 more days of oral doxycycline and oral ciprofloxacin.  Patient will follow-up with Dr. Jacqualyn [name] of podiatry on Monday, 12/17/2018.  On follow-up wound cultures will need to be followed up upon.    2.  Diabetes mellitus type 2 with diabetic neuropathy Hemoglobin A1c 6.7 on 09/11/2018.    Patient's metformin was held during the hospitalization.  Patient maintained on sliding scale insulin as well as home regimen gabapentin.  Metformin will be resumed on discharge.  3.  Pancreatic insufficiency Patient maintained on home regimen of Creon.   4.  Hyperlipidemia Patient maintained on home regimen of Lipitor and fenofibrate.  5.  Paroxysmal atrial fibrillation Remained rate controlled during the hospitalization.  Patient maintained on home regimen of Xarelto for anticoagulation.   6.  Non-Hodgkin's lymphoma Patient maintained on home regimen of allopurinol. Rituximab as outpatient.  7.   Anxiety Remained stable.  Patient maintained on home regimen of Zoloft.   8.  History of metastatic prostate cancer Outpatient follow-up with oncology.  Procedures:  Plain films of the right foot pending 12/11/2018   Consultations:  Podiatry: Dr. Jacqualyn [name] 12/12/2018  Discharge Exam: Vitals:   12/12/18 1931 12/13/18 0312  BP: (!) 102/59 (!) 100/59  Pulse: 62 86  Resp: 18 16  Temp: 98.9 F (37.2 C) 98.2 F (36.8 C)  SpO2: 98% 95%    General: NAD Cardiovascular: RRR Respiratory: CTAB  Discharge Instructions   Discharge Instructions    Diet Carb Modified   Complete by: As directed    Increase activity slowly   Complete by: As directed      Allergies as of 12/13/2018      Reactions   Atenolol Other (See Comments)   "Heart rate slowed- STOPPED on 08/16/2013"   Niacin Other (See Comments)   headaches      Medication List    STOP taking these medications   cephALEXin 500 MG capsule Commonly known as: KEFLEX     TAKE these medications   acetaminophen 500 MG tablet Commonly known as: TYLENOL He can take 2 tablets every 8 hours as needed for as long as he has discomfort.  You can buy this at any drug store over the counter. What changed:   how much to take  how to take this  when to take this  reasons to take this  additional instructions   allopurinol 300 MG tablet Commonly known as: ZYLOPRIM Take 300 mg by mouth daily after breakfast.   atorvastatin 20 MG tablet Commonly known as: LIPITOR Take 20 mg by mouth every evening.   CALCIUM 600/VITAMIN D3 PO Take 2 tablets by mouth daily.   cetirizine 10 MG tablet Commonly known as: ZYRTEC Take 10 mg by mouth daily.   ciprofloxacin 500 MG tablet Commonly known as: CIPRO Take 1 tablet (500 mg total) by mouth 2 (two) times daily for 8 days.   diclofenac sodium 1 % Gel Commonly known as: VOLTAREN Apply 2 g topically 4 (four) times daily. To right hip for pain control What changed:   when to  take this  reasons to take this   doxycycline 100 MG tablet Commonly known as: ADOXA Take 1 tablet (100 mg total) by mouth 2 (two) times daily for 8 days.   fenofibrate 160 MG tablet Take 160 mg by mouth every evening.   gabapentin 300 MG capsule Commonly known as: NEURONTIN Take 300 mg by mouth 3 (three) times daily.   Insulin Glargine (2 Unit Dial) 300 UNIT/ML Sopn Commonly known as: Toujeo Max SoloStar Inject 10 Units into the skin at bedtime. What changed: when to take this   latanoprost 0.005 % ophthalmic solution Commonly known as: XALATAN Place 1 drop into both eyes at bedtime.   leuprolide 22.5 MG injection Commonly known as: LUPRON Inject 22.5 mg into the muscle every 3 (three) months.   lidocaine-prilocaine cream Commonly known as: EMLA Apply 1 application topically as needed (for port-a-cath before treatments- Rituxin; every 8 weeks).   lipase/protease/amylase  12000 units Cpep capsule Commonly known as: CREON Take 12,000 Units by mouth 3 (three) times daily with meals.   magnesium oxide 400 MG tablet Commonly known as: MAG-OX Take 400 mg by mouth 2 (two) times daily.   metFORMIN 500 MG 24 hr tablet Commonly known as: GLUCOPHAGE-XR Take 500 mg by mouth 2 (two) times a day.   mupirocin ointment 2 % Commonly known as: BACTROBAN Apply 1 application topically daily.   One-A-Day Mens 50+ Advantage Tabs Take 1 tablet by mouth daily with breakfast.   Rivaroxaban 15 MG Tabs tablet Commonly known as: XARELTO Take 15 mg by mouth daily with lunch.   sertraline 100 MG tablet Commonly known as: ZOLOFT Take 100 mg by mouth daily after breakfast.   Vitamin B-12 2500 MCG Subl Place 2,500 mcg under the tongue every morning.   vitamin C 500 MG tablet Commonly known as: ASCORBIC ACID Take 500 mg by mouth daily after breakfast.      Allergies  Allergen Reactions  . Atenolol Other (See Comments)    "Heart rate slowed- STOPPED on 08/16/2013"  . Niacin Other  (See Comments)    headaches   Follow-up Information    Croitoru, Mihai, MD .   Specialty: Cardiology Contact information: [address] [place] [address] [phone]        Trula Slade, DPM Follow up on 12/17/2018.   Specialty: Podiatry Why: f/u as scheduled. Contact information: [address] [place] [address] [phone]            The results of significant diagnostics from this hospitalization (including imaging, microbiology, ancillary and laboratory) are listed below for reference.    Significant Diagnostic Studies: No results found.  Microbiology: Recent Results (from the past 240 hour(s))  WOUND CULTURE     Status: None   Collection Time: 12/11/18  5:05 PM   Specimen: Wound  Result Value Ref Range Status   MICRO NUMBER: [id number]  Preliminary   SPECIMEN QUALITY: Adequate  Preliminary   SOURCE: WOUND (SITE NOT SPECIFIED)  Preliminary   STATUS: AMENDED - PRELIMINARY  Corrected   GRAM STAIN:   Preliminary    No white blood cells seen No epithelial cells seen No organisms seen   RESULT: Culture in progress  Preliminary  Culture, blood (Routine x 2)     Status: None (Preliminary result)   Collection Time: 12/11/18  6:42 PM   Specimen: BLOOD  Result Value Ref Range Status   Specimen Description   Final    BLOOD RIGHT ANTECUBITAL Performed at [facility] [address], [place], [place] [address]    Special Requests   Final    BOTTLES DRAWN AEROBIC AND ANAEROBIC Blood Culture adequate volume Performed at [facility] [address], [place], [place] [address]    Culture   Final    NO GROWTH 2 DAYS Performed at [facility] [address], [place], [place] [address]    Report Status PENDING  Incomplete  Culture, blood (Routine x 2)     Status: None (Preliminary result)   Collection Time: 12/11/18  6:47 PM   Specimen: BLOOD  Result Value Ref Range Status    Specimen Description   Final    BLOOD LEFT ANTECUBITAL Performed at [facility] [address], [place], [place] [address]    Special Requests   Final    BOTTLES DRAWN AEROBIC AND ANAEROBIC Blood Culture adequate volume  Performed at [facility], [place] [address] [place], [place] [address]    Culture   Final    NO GROWTH 2 DAYS Performed at [facility] [address], [place], [place] [address]    Report Status PENDING  Incomplete  SARS Coronavirus [facility] order, Performed in [facility] hospital lab) Nasopharyngeal Nasopharyngeal Swab     Status: None   Collection Time: 12/11/18  9:01 PM   Specimen: Nasopharyngeal Swab  Result Value Ref Range Status   SARS Coronavirus 2 NEGATIVE NEGATIVE Final    Comment: (NOTE) If result is NEGATIVE SARS-CoV-2 target nucleic acids are NOT DETECTED. The SARS-CoV-2 RNA is generally detectable in upper and lower  respiratory specimens during the acute phase of infection. The lowest  concentration of SARS-CoV-2 viral copies this assay can detect is 250  copies / mL. A negative result does not preclude SARS-CoV-2 infection  and should not be used as the sole basis for treatment or other  patient management decisions.  A negative result may occur with  improper specimen collection / handling, submission of specimen other  than nasopharyngeal swab, presence of viral mutation(s) within the  areas targeted by this assay, and inadequate number of viral copies  (<250 copies / mL). A negative result must be combined with clinical  observations, patient history, and epidemiological information. If result is POSITIVE SARS-CoV-2 target nucleic acids are DETECTED. The SARS-CoV-2 RNA is generally detectable in upper and lower  respiratory specimens dur ing the acute phase of infection.  Positive  results are indicative of active infection with SARS-CoV-2.  Clinical  correlation with patient history and other  diagnostic information is  necessary to determine patient infection status.  Positive results do  not rule out bacterial infection or co-infection with other viruses. If result is PRESUMPTIVE POSTIVE SARS-CoV-2 nucleic acids MAY BE PRESENT.   A presumptive positive result was obtained on the submitted specimen  and confirmed on repeat testing.  While 2019 novel coronavirus  (SARS-CoV-2) nucleic acids may be present in the submitted sample  additional confirmatory testing may be necessary for epidemiological  and / or clinical management purposes  to differentiate between  SARS-CoV-2 and other Sarbecovirus currently known to infect humans.  If clinically indicated additional testing with an alternate test  methodology [phone]) is advised. The SARS-CoV-2 RNA is generally  detectable in upper and lower respiratory sp ecimens during the acute  phase of infection. The expected result is Negative. Fact Sheet for Patients:  StrictlyIdeas.no Fact Sheet for Healthcare Providers: BankingDealers.co.za This test is not yet approved or cleared by the [place] FDA and has been authorized for detection and/or diagnosis of SARS-CoV-2 by FDA under an Emergency Use Authorization (EUA).  This EUA will remain in effect (meaning this test can be used) for the duration of the COVID-19 declaration under Section 564(b)(1) of the Act, 21 U.S.C. section 360bbb-3(b)(1), unless the authorization is terminated or revoked sooner. Performed at [facility], [place] [address], [place], [place] [address]      Labs: Basic Metabolic Panel: Recent Labs  Lab 12/11/18 1840 12/12/18 0512 12/13/18 0305  NA 133* 137 136  K 4.0 3.8 3.9  CL 100 107 104  CO2 22 21* 22  GLUCOSE 176* 158* 157*  BUN 33* 26* 28*  CREATININE 1.31* 1.19 1.30*  CALCIUM 9.5 8.9 8.9   Liver Function Tests: Recent Labs  Lab 12/11/18 1840 12/12/18 0512  AST 34 27   ALT 29  25  ALKPHOS 64 50  BILITOT 0.2* 0.4  PROT 6.8 5.9*  ALBUMIN 4.1 3.4*   No results for input(s): LIPASE, AMYLASE in the last 168 hours. No results for input(s): AMMONIA in the last 168 hours. CBC: Recent Labs  Lab 12/11/18 1840 12/12/18 0512 12/13/18 0305  WBC 7.1 3.6* 4.4  NEUTROABS 6.0  --  3.5  HGB 10.1* 8.9* 9.6*  HCT 31.9* 28.4* 31.0*  MCV 84.4 84.5 85.9  PLT 181 118* 151   Cardiac Enzymes: No results for input(s): CKTOTAL, CKMB, CKMBINDEX, TROPONINI in the last 168 hours. BNP: BNP (last 3 results) No results for input(s): BNP in the last 8760 hours.  ProBNP (last 3 results) No results for input(s): PROBNP in the last 8760 hours.  CBG: Recent Labs  Lab 12/12/18 1159 12/12/18 1555 12/12/18 1932 12/13/18 0801 12/13/18 1218  GLUCAP 163* 107* 238* 160* 152*       Signed:  Irine Seal MD.  [facility] 12/13/2018, 1:23 PM

## 2018-12-14 DIAGNOSIS — E1121 Type 2 diabetes mellitus with diabetic nephropathy: Secondary | ICD-10-CM | POA: Diagnosis not present

## 2018-12-14 DIAGNOSIS — K7460 Unspecified cirrhosis of liver: Secondary | ICD-10-CM | POA: Diagnosis not present

## 2018-12-14 DIAGNOSIS — E782 Mixed hyperlipidemia: Secondary | ICD-10-CM | POA: Diagnosis not present

## 2018-12-14 DIAGNOSIS — I259 Chronic ischemic heart disease, unspecified: Secondary | ICD-10-CM | POA: Diagnosis not present

## 2018-12-14 DIAGNOSIS — I4891 Unspecified atrial fibrillation: Secondary | ICD-10-CM | POA: Diagnosis not present

## 2018-12-14 DIAGNOSIS — E11319 Type 2 diabetes mellitus with unspecified diabetic retinopathy without macular edema: Secondary | ICD-10-CM | POA: Diagnosis not present

## 2018-12-14 DIAGNOSIS — D692 Other nonthrombocytopenic purpura: Secondary | ICD-10-CM | POA: Diagnosis not present

## 2018-12-14 DIAGNOSIS — C8590 Non-Hodgkin lymphoma, unspecified, unspecified site: Secondary | ICD-10-CM | POA: Diagnosis not present

## 2018-12-14 DIAGNOSIS — N529 Male erectile dysfunction, unspecified: Secondary | ICD-10-CM | POA: Diagnosis not present

## 2018-12-14 DIAGNOSIS — I10 Essential (primary) hypertension: Secondary | ICD-10-CM | POA: Diagnosis not present

## 2018-12-14 DIAGNOSIS — C61 Malignant neoplasm of prostate: Secondary | ICD-10-CM | POA: Diagnosis not present

## 2018-12-14 DIAGNOSIS — D649 Anemia, unspecified: Secondary | ICD-10-CM | POA: Diagnosis not present

## 2018-12-14 LAB — IGG, IGA, IGM
IgA: 55 mg/dL — ABNORMAL LOW (ref 61–437)
IgG (Immunoglobin G), Serum: 508 mg/dL — ABNORMAL LOW (ref 603–1613)
IgM (Immunoglobulin M), Srm: 12 mg/dL — ABNORMAL LOW (ref 15–143)

## 2018-12-16 LAB — CULTURE, BLOOD (ROUTINE X 2)
Culture: NO GROWTH
Culture: NO GROWTH
Special Requests: ADEQUATE
Special Requests: ADEQUATE

## 2018-12-16 LAB — WOUND CULTURE
MICRO NUMBER:: 784310
SPECIMEN QUALITY:: ADEQUATE

## 2018-12-17 ENCOUNTER — Other Ambulatory Visit: Payer: Self-pay

## 2018-12-17 ENCOUNTER — Encounter: Payer: Self-pay | Admitting: Podiatry

## 2018-12-17 ENCOUNTER — Ambulatory Visit (INDEPENDENT_AMBULATORY_CARE_PROVIDER_SITE_OTHER): Payer: Medicare Other | Admitting: Podiatry

## 2018-12-17 VITALS — Temp 97.9°F

## 2018-12-17 DIAGNOSIS — L97501 Non-pressure chronic ulcer of other part of unspecified foot limited to breakdown of skin: Secondary | ICD-10-CM

## 2018-12-17 DIAGNOSIS — M14671 Charcot's joint, right ankle and foot: Secondary | ICD-10-CM | POA: Diagnosis not present

## 2018-12-17 DIAGNOSIS — L03119 Cellulitis of unspecified part of limb: Secondary | ICD-10-CM

## 2018-12-17 DIAGNOSIS — L02619 Cutaneous abscess of unspecified foot: Secondary | ICD-10-CM | POA: Diagnosis not present

## 2018-12-19 DIAGNOSIS — N131 Hydronephrosis with ureteral stricture, not elsewhere classified: Secondary | ICD-10-CM | POA: Diagnosis not present

## 2018-12-19 DIAGNOSIS — C61 Malignant neoplasm of prostate: Secondary | ICD-10-CM | POA: Diagnosis not present

## 2018-12-19 DIAGNOSIS — Z5111 Encounter for antineoplastic chemotherapy: Secondary | ICD-10-CM | POA: Diagnosis not present

## 2018-12-19 NOTE — Progress Notes (Signed)
Subjective: 85 year old male presents the office with his daughter for follow-up evaluation of a wound, infection to his right foot after he discharged in the hospital.  Still with doxycycline, ciprofloxacin.  Overall is doing well.  Some occasional discomfort.  He did notice a little bit of bleeding over the weekend but no pus. Denies any systemic complaints such as fevers, chills, nausea, vomiting. No acute changes since last appointment, and no other complaints at this time.   Objective: AAO x3, NAD DP/PT pulses palpable bilaterally, CRT less than 3 seconds Chronic Charcot is present.  Hyperkeratotic lesion on the plantar medial aspect the right midfoot.  Upon debridement there is a small superficial area of skin breakdown however there is no probing, undermining or tunneling.  Cellulitis appears to be resolved.  Still some minimal swelling but no significant warmth.  No ascending cellulitis.  No fluctuation crepitation. No open lesions or pre-ulcerative lesions.  No pain with calf compression, swelling, warmth, erythema  Assessment: Right foot resolving infection, ulceration  Plan: -All treatment options discussed with the patient including all alternatives, risks, complications.  -I debrided hyperkeratotic tissue overlying the wound any complications or bleeding.  Continue with mupirocin cream and dressing changes daily but he has been home.  Finish course of antibiotics.  Discussed he needs to hold off on working in the garden being on his feet until we can get the diabetic shoe with insert to help offload that area -Patient encouraged to call the office with any questions, concerns, change in symptoms.   Return in about 10 days (around 12/27/2018).  Trula Slade DPM

## 2018-12-27 ENCOUNTER — Ambulatory Visit: Payer: Medicare Other | Admitting: Podiatry

## 2019-01-01 ENCOUNTER — Ambulatory Visit (INDEPENDENT_AMBULATORY_CARE_PROVIDER_SITE_OTHER): Payer: Medicare Other | Admitting: Podiatry

## 2019-01-01 ENCOUNTER — Other Ambulatory Visit: Payer: Medicare Other | Admitting: Orthotics

## 2019-01-01 ENCOUNTER — Other Ambulatory Visit: Payer: Self-pay

## 2019-01-01 VITALS — Temp 97.5°F

## 2019-01-01 DIAGNOSIS — M14671 Charcot's joint, right ankle and foot: Secondary | ICD-10-CM

## 2019-01-01 DIAGNOSIS — L97501 Non-pressure chronic ulcer of other part of unspecified foot limited to breakdown of skin: Secondary | ICD-10-CM

## 2019-01-01 MED ORDER — MUPIROCIN 2 % EX OINT: 1.0000 "application " | TOPICAL_OINTMENT | Freq: Every day | CUTANEOUS | 2 refills | Status: DC

## 2019-01-07 ENCOUNTER — Other Ambulatory Visit: Payer: Self-pay | Admitting: *Deleted

## 2019-01-13 NOTE — Progress Notes (Signed)
Subjective: 85 year old male presents the office with his daughter for follow-up evaluation of a wound, infection to his right foot.  Overall he states that he is doing well.  Some occasional discomfort but denies any drainage or pus coming from the area.  He does feel that the wound is getting better.  He is awaiting his diabetic shoes, inserts. Denies any systemic complaints such as fevers, chills, nausea, vomiting. No acute changes since last appointment, and no other complaints at this time.   Objective: AAO x3, NAD DP/PT pulses palpable bilaterally, CRT less than 3 seconds Chronic Charcot is present.  Hyperkeratotic lesion on the plantar medial aspect the right midfoot.  Upon debridement today there is no ongoing ulceration, drainage or any signs of infection to the area is pre-ulcerative.  No edema, erythema.  No fluctuation crepitation or any malodor. No open lesions or pre-ulcerative lesions.  No pain with calf compression, swelling, warmth, erythema  Assessment: Right foot resolving infection, ulceration which appears to be healed  Plan: -All treatment options discussed with the patient including all alternatives, risks, complications.  -I debrided hyperkeratotic tissue overlying the wound any complications or bleeding.  Continue with daily dressing changes although the wound is healed is still pre-ulcerative.  I want to hold off on returning to the garden or doing a lot of walking till he gets the diabetic shoe, insert.  Discussed the importance of daily foot inspection to assure there is no further skin breakdown or signs of infection.  Return in about 2 weeks (around 01/15/2019).  Trula Slade DPM

## 2019-01-14 ENCOUNTER — Encounter: Payer: Self-pay | Admitting: Podiatry

## 2019-01-14 ENCOUNTER — Ambulatory Visit: Payer: Medicare Other | Admitting: Orthotics

## 2019-01-14 ENCOUNTER — Other Ambulatory Visit: Payer: Self-pay

## 2019-01-14 ENCOUNTER — Ambulatory Visit (INDEPENDENT_AMBULATORY_CARE_PROVIDER_SITE_OTHER): Payer: Medicare Other | Admitting: Podiatry

## 2019-01-14 DIAGNOSIS — L02619 Cutaneous abscess of unspecified foot: Secondary | ICD-10-CM | POA: Diagnosis not present

## 2019-01-14 DIAGNOSIS — L97501 Non-pressure chronic ulcer of other part of unspecified foot limited to breakdown of skin: Secondary | ICD-10-CM | POA: Diagnosis not present

## 2019-01-14 DIAGNOSIS — E1149 Type 2 diabetes mellitus with other diabetic neurological complication: Secondary | ICD-10-CM

## 2019-01-14 DIAGNOSIS — L03119 Cellulitis of unspecified part of limb: Secondary | ICD-10-CM

## 2019-01-14 MED ORDER — DOXYCYCLINE HYCLATE 100 MG PO TABS: 100.0000 mg | ORAL_TABLET | Freq: Two times a day (BID) | ORAL | 0 refills | Status: DC

## 2019-01-14 MED ORDER — MUPIROCIN 2 % EX OINT: 1.0000 "application " | TOPICAL_OINTMENT | Freq: Every day | CUTANEOUS | 2 refills | Status: DC

## 2019-01-15 ENCOUNTER — Encounter (HOSPITAL_COMMUNITY)
Admission: RE | Admit: 2019-01-15 | Discharge: 2019-01-15 | Disposition: A | Discharge status: Home / Self Care | Payer: Medicare Other | Source: Ambulatory Visit | Attending: Adult Health | Admitting: Adult Health

## 2019-01-15 ENCOUNTER — Encounter: Payer: Self-pay | Admitting: Oncology

## 2019-01-15 DIAGNOSIS — I480 Paroxysmal atrial fibrillation: Secondary | ICD-10-CM | POA: Insufficient documentation

## 2019-01-15 DIAGNOSIS — Z79899 Other long term (current) drug therapy: Secondary | ICD-10-CM | POA: Insufficient documentation

## 2019-01-15 DIAGNOSIS — C8290 Follicular lymphoma, unspecified, unspecified site: Secondary | ICD-10-CM | POA: Diagnosis not present

## 2019-01-15 DIAGNOSIS — C8590 Non-Hodgkin lymphoma, unspecified, unspecified site: Secondary | ICD-10-CM | POA: Diagnosis not present

## 2019-01-15 DIAGNOSIS — C8580 Other specified types of non-Hodgkin lymphoma, unspecified site: Secondary | ICD-10-CM | POA: Insufficient documentation

## 2019-01-15 DIAGNOSIS — I2510 Atherosclerotic heart disease of native coronary artery without angina pectoris: Secondary | ICD-10-CM | POA: Insufficient documentation

## 2019-01-15 LAB — GLUCOSE, CAPILLARY: Glucose-Capillary: 114 mg/dL — ABNORMAL HIGH (ref 70–99)

## 2019-01-15 MED ORDER — FLUDEOXYGLUCOSE F - 18 (FDG) INJECTION
8.5100 | Freq: Once | INTRAVENOUS | Status: AC | PRN
  Administered 2019-01-15: 8.51 via INTRAVENOUS

## 2019-01-15 NOTE — Progress Notes (Signed)
Subjective: 85 year old male presents the office with his daughter for follow-up evaluation of a wound, infection to his right foot.  States he is doing well.  Denies any drainage or pus coming from the area.  Occasional discomfort.  He is going to get diabetic shoes, inserts today.   Denies any systemic complaints such as fevers, chills, nausea, vomiting. No acute changes since last appointment, and no other complaints at this time.   Objective: AAO x3, NAD DP/PT pulses palpable bilaterally, CRT less than 3 seconds Chronic Charcot is present.  Hyperkeratotic lesion on the plantar medial aspect the right midfoot.  Upon debridement there is a superficial granular wound present and there is mild surrounding erythema but no ascending cellulitis.  No fluctuation crepitation.  There is no purulence. No open lesions or pre-ulcerative lesions.  No pain with calf compression, swelling, warmth, erythema  Assessment: Right foot mild recurrent ulceration with erythema  Plan: -All treatment options discussed with the patient including all alternatives, risks, complications.  -I debrided hyperkeratotic tissue to reveal a small ulceration.  Given his history as well as erythema to prescribe doxycycline.  Recommend mupirocin ointment dressing changes daily. -Offloading.  Diabetic shoes, inserts were dispensed today by Liliane Channel -Monitor for any clinical signs or symptoms of infection and directed to call the office immediately should any occur or go to the ER.  Return in about 1 week (around 01/21/2019).  Trula Slade DPM

## 2019-01-21 ENCOUNTER — Ambulatory Visit: Payer: Medicare Other | Admitting: Orthotics

## 2019-01-21 ENCOUNTER — Other Ambulatory Visit: Payer: Self-pay

## 2019-01-21 ENCOUNTER — Ambulatory Visit (INDEPENDENT_AMBULATORY_CARE_PROVIDER_SITE_OTHER): Payer: Medicare Other | Admitting: Podiatry

## 2019-01-21 ENCOUNTER — Encounter: Payer: Self-pay | Admitting: Podiatry

## 2019-01-21 DIAGNOSIS — L97501 Non-pressure chronic ulcer of other part of unspecified foot limited to breakdown of skin: Secondary | ICD-10-CM

## 2019-01-21 DIAGNOSIS — L02619 Cutaneous abscess of unspecified foot: Secondary | ICD-10-CM

## 2019-01-21 DIAGNOSIS — L03119 Cellulitis of unspecified part of limb: Secondary | ICD-10-CM

## 2019-01-21 DIAGNOSIS — M14671 Charcot's joint, right ankle and foot: Secondary | ICD-10-CM

## 2019-01-21 NOTE — Progress Notes (Signed)
Offload two of his three diabetic shoes to address ulcers.

## 2019-01-23 NOTE — Progress Notes (Signed)
Subjective: 85 year old male presents the office with his daughter for follow-up evaluation of a wound, infection to his right foot.  He finished the antibiotics and has been keeping on the mupirocin ointment to the area daily.  He has very small meta bloody drainage at times but no pus.  Occasionally get some discomfort but overall minimal.  No other concerns.  No fevers, chills, nausea, vomiting.  Objective: AAO x3, NAD DP/PT pulses palpable bilaterally, CRT less than 3 seconds Chronic Charcot is present.  Hyperkeratotic lesion on the plantar medial aspect the right midfoot.  Upon debridement today very small superficial granular wound is present there is no probing, undermining or tunneling.  There is mild erythema which is been chronic on the medial aspect of foot but this is more from where is keeping her foot bandaged.  There is no increase in warmth there joint.  No malodor. No pain with calf compression, swelling, warmth, erythema  Assessment: Right foot resolving infection mild recurrent ulceration  Plan: -All treatment options discussed with the patient including all alternatives, risks, complications.  -I debrided hyperkeratotic tissue overlying the wound any complications or bleeding.  Debrided the wound today to healthy tissue.  Continue Pearson ointment dressing changes daily.  Continue offloading.   -Rick further modified inserts today. -Monitor for any clinical signs or symptoms of infection and directed to call the office immediately should any occur or go to the ER.  Trula Slade DPM

## 2019-01-28 ENCOUNTER — Inpatient Hospital Stay: Payer: Medicare Other

## 2019-01-28 ENCOUNTER — Other Ambulatory Visit: Payer: Self-pay

## 2019-01-28 ENCOUNTER — Inpatient Hospital Stay: Payer: Medicare Other | Attending: Oncology

## 2019-01-28 VITALS — BP 103/52 | HR 64 | Temp 98.7°F | Resp 16 | Ht 68.0 in | Wt 157.5 lb

## 2019-01-28 DIAGNOSIS — C8219 Follicular lymphoma grade II, extranodal and solid organ sites: Secondary | ICD-10-CM

## 2019-01-28 DIAGNOSIS — C8590 Non-Hodgkin lymphoma, unspecified, unspecified site: Secondary | ICD-10-CM | POA: Insufficient documentation

## 2019-01-28 DIAGNOSIS — C61 Malignant neoplasm of prostate: Secondary | ICD-10-CM

## 2019-01-28 DIAGNOSIS — C8203 Follicular lymphoma grade I, intra-abdominal lymph nodes: Secondary | ICD-10-CM

## 2019-01-28 DIAGNOSIS — Z5112 Encounter for antineoplastic immunotherapy: Secondary | ICD-10-CM | POA: Diagnosis not present

## 2019-01-28 DIAGNOSIS — C8580 Other specified types of non-Hodgkin lymphoma, unspecified site: Secondary | ICD-10-CM

## 2019-01-28 DIAGNOSIS — Z95828 Presence of other vascular implants and grafts: Secondary | ICD-10-CM

## 2019-01-28 LAB — CBC WITH DIFFERENTIAL/PLATELET
Abs Immature Granulocytes: 0.09 10*3/uL — ABNORMAL HIGH (ref 0.00–0.07)
Basophils Absolute: 0 10*3/uL (ref 0.0–0.1)
Basophils Relative: 1 %
Eosinophils Absolute: 0.2 10*3/uL (ref 0.0–0.5)
Eosinophils Relative: 4 %
HCT: 32.3 % — ABNORMAL LOW (ref 39.0–52.0)
Hemoglobin: 10.2 g/dL — ABNORMAL LOW (ref 13.0–17.0)
Immature Granulocytes: 2 %
Lymphocytes Relative: 4 %
Lymphs Abs: 0.3 10*3/uL — ABNORMAL LOW (ref 0.7–4.0)
MCH: 26.1 pg (ref 26.0–34.0)
MCHC: 31.6 g/dL (ref 30.0–36.0)
MCV: 82.6 fL (ref 80.0–100.0)
Monocytes Absolute: 0.4 10*3/uL (ref 0.1–1.0)
Monocytes Relative: 6 %
Neutro Abs: 5.2 10*3/uL (ref 1.7–7.7)
Neutrophils Relative %: 83 %
Platelets: 155 10*3/uL (ref 150–400)
RBC: 3.91 MIL/uL — ABNORMAL LOW (ref 4.22–5.81)
RDW: 16.1 % — ABNORMAL HIGH (ref 11.5–15.5)
WBC: 6.2 10*3/uL (ref 4.0–10.5)
nRBC: 0 % (ref 0.0–0.2)

## 2019-01-28 LAB — COMPREHENSIVE METABOLIC PANEL
ALT: 24 U/L (ref 0–44)
AST: 47 U/L — ABNORMAL HIGH (ref 15–41)
Albumin: 3.9 g/dL (ref 3.5–5.0)
Alkaline Phosphatase: 68 U/L (ref 38–126)
Anion gap: 9 (ref 5–15)
BUN: 30 mg/dL — ABNORMAL HIGH (ref 8–23)
CO2: 22 mmol/L (ref 22–32)
Calcium: 9.5 mg/dL (ref 8.9–10.3)
Chloride: 104 mmol/L (ref 98–111)
Creatinine, Ser: 1.28 mg/dL — ABNORMAL HIGH (ref 0.61–1.24)
GFR calc Af Amer: 59 mL/min — ABNORMAL LOW (ref >60)
GFR calc non Af Amer: 51 mL/min — ABNORMAL LOW (ref >60)
Glucose, Bld: 245 mg/dL — ABNORMAL HIGH (ref 70–99)
Potassium: 4.2 mmol/L (ref 3.5–5.1)
Sodium: 135 mmol/L (ref 135–145)
Total Bilirubin: 0.4 mg/dL (ref 0.3–1.2)
Total Protein: 6.5 g/dL (ref 6.5–8.1)

## 2019-01-28 LAB — LACTATE DEHYDROGENASE: LDH: 126 U/L (ref 98–192)

## 2019-01-28 MED ORDER — SODIUM CHLORIDE 0.9% FLUSH
10.0000 mL | Freq: Once | INTRAVENOUS | Status: AC
  Administered 2019-01-28: 10 mL
  Filled 2019-01-28: qty 10

## 2019-01-28 MED ORDER — SODIUM CHLORIDE 0.9 % IV SOLN
Freq: Once | INTRAVENOUS | Status: AC
  Administered 2019-01-28: 10:00:00 via INTRAVENOUS
  Filled 2019-01-28: qty 250

## 2019-01-28 MED ORDER — SODIUM CHLORIDE 0.9% FLUSH
10.0000 mL | INTRAVENOUS | Status: DC | PRN
  Administered 2019-01-28: 10 mL
  Filled 2019-01-28: qty 10

## 2019-01-28 MED ORDER — DIPHENHYDRAMINE HCL 25 MG PO CAPS
25.0000 mg | ORAL_CAPSULE | Freq: Once | ORAL | Status: AC
  Administered 2019-01-28: 10:00:00 25 mg via ORAL

## 2019-01-28 MED ORDER — DIPHENHYDRAMINE HCL 25 MG PO CAPS
ORAL_CAPSULE | ORAL | Status: AC
  Filled 2019-01-28: qty 1

## 2019-01-28 MED ORDER — ACETAMINOPHEN 325 MG PO TABS
650.0000 mg | ORAL_TABLET | Freq: Once | ORAL | Status: AC
  Administered 2019-01-28: 650 mg via ORAL

## 2019-01-28 MED ORDER — SODIUM CHLORIDE 0.9 % IJ SOLN: 10.0000 mL | INTRAMUSCULAR | Status: DC | PRN

## 2019-01-28 MED ORDER — SODIUM CHLORIDE 0.9 % IV SOLN
350.0000 mg/m2 | Freq: Once | INTRAVENOUS | Status: AC
  Administered 2019-01-28: 700 mg via INTRAVENOUS
  Filled 2019-01-28: qty 50

## 2019-01-28 MED ORDER — ACETAMINOPHEN 325 MG PO TABS
ORAL_TABLET | ORAL | Status: AC
  Filled 2019-01-28: qty 2

## 2019-01-28 MED ORDER — HEPARIN SOD (PORK) LOCK FLUSH 100 UNIT/ML IV SOLN
500.0000 [IU] | Freq: Once | INTRAVENOUS | Status: AC | PRN
  Administered 2019-01-28: 12:00:00 500 [IU]
  Filled 2019-01-28: qty 5

## 2019-01-28 NOTE — Patient Instructions (Signed)
[facility] Discharge Instructions for Patients Receiving Chemotherapy  Today you received the following chemotherapy agents: Rituximab   To help prevent nausea and vomiting after your treatment, we encourage you to take your nausea medication  as prescribed.    If you develop nausea and vomiting that is not controlled by your nausea medication, call the clinic.   BELOW ARE SYMPTOMS THAT SHOULD BE REPORTED IMMEDIATELY:  *FEVER GREATER THAN 100.5 F  *CHILLS WITH OR WITHOUT FEVER  NAUSEA AND VOMITING THAT IS NOT CONTROLLED WITH YOUR NAUSEA MEDICATION  *UNUSUAL [name]  *UNUSUAL BRUISING OR BLEEDING  TENDERNESS IN MOUTH AND THROAT WITH OR WITHOUT PRESENCE OF ULCERS  *URINARY PROBLEMS  *BOWEL PROBLEMS  UNUSUAL RASH Items with * indicate a potential emergency and should be followed up as soon as possible.  Feel free to call the clinic should you have any questions or concerns. The clinic phone number is [phone].  Please show the CHEMO ALERT CARD at check-in to the Emergency Department and triage nurse.   

## 2019-01-31 ENCOUNTER — Other Ambulatory Visit: Payer: Self-pay

## 2019-01-31 DIAGNOSIS — Z20828 Contact with and (suspected) exposure to other viral communicable diseases: Secondary | ICD-10-CM | POA: Diagnosis not present

## 2019-01-31 DIAGNOSIS — Z20822 Contact with and (suspected) exposure to covid-19: Secondary | ICD-10-CM

## 2019-02-02 LAB — NOVEL CORONAVIRUS, NAA: SARS-CoV-2, NAA: NOT DETECTED

## 2019-02-04 ENCOUNTER — Ambulatory Visit: Payer: Medicare Other | Admitting: Podiatry

## 2019-02-05 ENCOUNTER — Ambulatory Visit (INDEPENDENT_AMBULATORY_CARE_PROVIDER_SITE_OTHER): Payer: Medicare Other | Admitting: Podiatry

## 2019-02-05 ENCOUNTER — Encounter: Payer: Self-pay | Admitting: Podiatry

## 2019-02-05 ENCOUNTER — Other Ambulatory Visit: Payer: Self-pay

## 2019-02-05 DIAGNOSIS — E1149 Type 2 diabetes mellitus with other diabetic neurological complication: Secondary | ICD-10-CM | POA: Diagnosis not present

## 2019-02-05 DIAGNOSIS — L97512 Non-pressure chronic ulcer of other part of right foot with fat layer exposed: Secondary | ICD-10-CM | POA: Diagnosis not present

## 2019-02-05 DIAGNOSIS — M14671 Charcot's joint, right ankle and foot: Secondary | ICD-10-CM
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
## 2019-02-19 ENCOUNTER — Other Ambulatory Visit: Payer: Self-pay

## 2019-02-19 ENCOUNTER — Ambulatory Visit (INDEPENDENT_AMBULATORY_CARE_PROVIDER_SITE_OTHER): Payer: Medicare Other | Admitting: Podiatry

## 2019-02-19 VITALS — Temp 98.7°F

## 2019-02-19 DIAGNOSIS — E1149 Type 2 diabetes mellitus with other diabetic neurological complication: Secondary | ICD-10-CM | POA: Diagnosis not present

## 2019-02-19 DIAGNOSIS — M14671 Charcot's joint, right ankle and foot: Secondary | ICD-10-CM | POA: Diagnosis not present

## 2019-02-19 DIAGNOSIS — L97512 Non-pressure chronic ulcer of other part of right foot with fat layer exposed: Secondary | ICD-10-CM

## 2019-02-23 NOTE — Progress Notes (Signed)
Subjective: 85 year old male presents the office with his daughter for follow-up evaluation of a wound, infection to his right foot.  He states he is doing well.  He gets some minimal bleeding at times but no purulence.  A few weeks ago he started to have a fever he had a Covid test that was negative.  They were concerned that sinus infection but they had a prescription of an antibiotic that had called in previously he never received when he got admitted to the hospital he started the antibiotic in case of infection to his foot.  Fever quickly resolved.  Denies any redness or swelling to his foot and no significant pain.  He is back to doing somewhat normal activities in the yard.  Currently denies any fevers, chills, nausea, vomiting.  No calf pain, chest pain, shortness of breath.  Objective: AAO x3, NAD DP/PT pulses palpable bilaterally, CRT less than 3 seconds Chronic Charcot is present.  Hyperkeratotic lesion present on the plantar medial aspect of foot along the bony prominence.  Upon review there is a small superficial wound measuring 0.5 x 0.2 cm but there is no probing, undermining or tunneling.  There is on the dorsal aspect of the hyperkeratotic lesion small moderate erythema.  This is been chronic and thinks is more irritation set of issues.  No warmth of the foot there is no swelling to this area.  There is no fluctuation crepitation. No ascending cellulitis.  It is faint in color.  No pain with calf compression, swelling, warmth, erythema  Assessment: Right foot resolving infection mild recurrent ulceration  Plan: -All treatment options discussed with the patient including all alternatives, risks, complications.  -I debrided hyperkeratotic tissue overlying the wound any complications or bleeding.  Continue mupirocin ointment dressing changes daily.  Offloading at all times.  I had [name] evaluate him today to see if they can further offload the lesion.  Ultimately we need to keep offloading  for this area.  If needed we could always consider an exostectomy to help with pressure.  We discussed this today. -Monitor for any clinical signs or symptoms of infection and directed to call the office immediately should any occur or go to the ER.  Return in about 2 weeks (around 03/05/2019).  [name] DPM

## 2019-03-05 ENCOUNTER — Ambulatory Visit (INDEPENDENT_AMBULATORY_CARE_PROVIDER_SITE_OTHER): Payer: Medicare Other | Admitting: Podiatry

## 2019-03-05 ENCOUNTER — Encounter: Payer: Self-pay | Admitting: Podiatry

## 2019-03-05 ENCOUNTER — Other Ambulatory Visit: Payer: Self-pay

## 2019-03-05 DIAGNOSIS — E1149 Type 2 diabetes mellitus with other diabetic neurological complication: Secondary | ICD-10-CM

## 2019-03-05 DIAGNOSIS — L97512 Non-pressure chronic ulcer of other part of right foot with fat layer exposed: Secondary | ICD-10-CM

## 2019-03-05 DIAGNOSIS — M14671 Charcot's joint, right ankle and foot: Secondary | ICD-10-CM | POA: Diagnosis not present

## 2019-03-05 NOTE — Progress Notes (Signed)
Subjective: 85 year old male presents the office with his daughter for follow-up evaluation of a wound, infection to his right foot.  He states that overall the wound is doing better since the orthotics have been modified.  He still gets some bloody drainage at times but denies any purulence.  He was given some redness on the superior portion of the wound but since then the orthotics were modified this has decreased substantially.  No swelling or warmth as per the report and he denies any fevers, chills, nausea, vomiting.  No calf pain, chest pain, shortness of breath.  Objective: AAO x3, NAD DP/PT pulses palpable bilaterally, CRT less than 3 seconds Chronic Charcot is present.  Hyperkeratotic lesion present on the plantar medial aspect of foot along the bony prominence.  Today the wound is more of a linear type of fissure wound with granular wound base.  Is about 1.4 cm x 0.2 cm x 0.2 cm.  Although it is larger I do this coming more from the callus that has formed.  There is no surrounding erythema today there is no drainage or pus or any fluctuation crepitation. No malodor. No pain with calf compression, swelling, warmth, erythema  Assessment: Right foot resolving infection recurrent ulceration  Plan: -All treatment options discussed with the patient including all alternatives, risks, complications.  -I debrided hyperkeratotic tissue overlying the wound any complications or bleeding.  Recommend switch from using the mupirocin ointment dressing and I am going to order a Hydrofera Blue dressing through  [facility]. Offloading at all times.  Right did modify his orthotics today.-Monitor for any clinical signs or symptoms of infection and directed to call the office immediately should any occur or go to the ER.  Return in about 3 weeks (around 03/26/2019).  Trula Slade DPM

## 2019-03-08 ENCOUNTER — Telehealth: Payer: Self-pay | Admitting: *Deleted

## 2019-03-08 NOTE — Telephone Encounter (Signed)
[facility] - Dan states their group does not have Ready Hydrofera Blue size for the wound, they do have the Classic would it be okay to send the Classic.

## 2019-03-08 NOTE — Telephone Encounter (Signed)
Unable to contact [facility] - Linna Hoff mailbox is full.

## 2019-03-08 NOTE — Telephone Encounter (Signed)
Unable to leave orders on [facility] - Dan phone mailbox is full. Faxed copy of the original orders with change to Classic and to send Saline as well.

## 2019-03-08 NOTE — Telephone Encounter (Signed)
Yes that is fine but they should send the saline with it.

## 2019-03-11 NOTE — Telephone Encounter (Signed)
Pt's dtr, Cammie Sickle states Dr. Jacqualyn Posey had wanted pt to stop an ointment and begin another product, and she believes the new product arrived today, small bottles of what looks like water, a roll of bandage that looks like it can be cut, and dry gauze, but no instructions or packing sheet. Can call today [phone] but if call tomorrow call pt's other dtr, [name] [phone] with the instructions.

## 2019-03-12 NOTE — Telephone Encounter (Signed)
Talked to one of the daughter's ([name]) and patient has not received any gauze or the hydrofera and I stated that I would call the representative and see what is the hold up and I will follow up and call the daughter back. [name]

## 2019-03-12 NOTE — Telephone Encounter (Signed)
[name] can you please call her and go over it with her? Thanks.

## 2019-03-14 ENCOUNTER — Other Ambulatory Visit (HOSPITAL_COMMUNITY)
Admission: RE | Admit: 2019-03-14 | Discharge: 2019-03-14 | Disposition: A | Discharge status: Home / Self Care | Payer: Medicare Other | Source: Ambulatory Visit | Attending: Urology | Admitting: Urology

## 2019-03-14 DIAGNOSIS — Z01812 Encounter for preprocedural laboratory examination: Secondary | ICD-10-CM | POA: Insufficient documentation

## 2019-03-14 DIAGNOSIS — Z20828 Contact with and (suspected) exposure to other viral communicable diseases: Secondary | ICD-10-CM | POA: Insufficient documentation

## 2019-03-14 NOTE — Patient Instructions (Addendum)
DUE TO COVID-19 ONLY ONE VISITOR IS ALLOWED TO COME WITH YOU AND STAY IN THE WAITING ROOM ONLY DURING PRE OP AND PROCEDURE DAY OF SURGERY. THE 1 VISITOR MAY VISIT WITH YOU AFTER SURGERY IN YOUR PRIVATE ROOM DURING VISITING HOURS ONLY!  YOU NEED TO HAVE A COVID 19 TEST ON 11-19-120,  PLEASE BEGIN THE QUARANTINE INSTRUCTIONS AS OUTLINED IN YOUR HANDOUT.                [name]  03/14/2019   Your procedure is scheduled on: 03-18-19    Report to [facility] Main  Entrance    Report to Admitting at 7:05 AM     Call this number if you have problems the morning of surgery [phone]    Remember: Do not eat food or drink liquids :After Midnight.     Take these medicines the morning of surgery with A SIP OF WATER: Allopurinol (Zyloprim), Cetirizine (Zyrtec), Fenofibrate, Gabapentin (Neurontin)  BRUSH YOUR TEETH MORNING OF SURGERY AND RINSE YOUR MOUTH OUT, NO CHEWING GUM CANDY OR MINTS.                                 You may not have any metal on your body including hair pins and              piercings     Do not wear jewelry, cologne, lotions, powders or deodorant                   Men may shave face and neck.   Do not bring valuables to the hospital. [place].  Contacts, dentures or bridgework may not be worn into surgery.       Patients discharged the day of surgery will not be allowed to drive home. IF YOU ARE HAVING SURGERY AND GOING HOME THE SAME DAY, YOU MUST HAVE AN ADULT TO DRIVE YOU HOME AND BE WITH YOU FOR 24 HOURS. YOU MAY GO HOME BY TAXI OR UBER OR ORTHERWISE, BUT AN ADULT MUST ACCOMPANY YOU HOME AND STAY WITH YOU FOR 24 HOURS.  Name and phone number of your driver:Leslie Cheral Almas [phone]  Special Instructions: N/A              Please read over the following fact sheets you were given: _____________________________________________________________________  How to Manage Your Diabetes Before and  After Surgery  Why is it important to control my blood sugar before and after surgery? . Improving blood sugar levels before and after surgery helps healing and can limit problems. . A way of improving blood sugar control is eating a healthy diet by: o  Eating less sugar and carbohydrates o  Increasing activity/exercise o  Talking with your doctor about reaching your blood sugar goals . High blood sugars (greater than 180 mg/dL) can raise your risk of infections and slow your recovery, so you will need to focus on controlling your diabetes during the weeks before surgery. . Make sure that the doctor who takes care of your diabetes knows about your planned surgery including the date and location.  How do I manage my blood sugar before surgery? . Check your blood sugar at least 4 times a day, starting 2 days before surgery, to make sure that the level is not too high or low. o  Check your blood sugar the morning of your surgery when you wake up and every 2 hours until you get to the Short Stay unit. . If your blood sugar is less than 70 mg/dL, you will need to treat for low blood sugar: o Do not take insulin. o Treat a low blood sugar (less than 70 mg/dL) with  cup of clear juice (cranberry or apple), 4 glucose tablets, OR glucose gel. o Recheck blood sugar in 15 minutes after treatment (to make sure it is greater than 70 mg/dL). If your blood sugar is not greater than 70 mg/dL on recheck, call [phone] for further instructions. . Report your blood sugar to the short stay nurse when you get to Short Stay.  . If you are admitted to the hospital after surgery: o Your blood sugar will be checked by the staff and you will probably be given insulin after surgery (instead of oral diabetes medicines) to make sure you have good blood sugar levels. o The goal for blood sugar control after surgery is 80-180 mg/dL.   WHAT DO I DO ABOUT MY DIABETES MEDICATION?  Marland Kitchen Do not take oral diabetes medicines  (pills) the morning of surgery.  . THE DAY BEFORE SURGERY, take your usual Metformin and your 10 units of Toujeo  insulin.                 [place] - Preparing for Surgery Before surgery, you can play an important role.  Because skin is not sterile, your skin needs to be as free of germs as possible.  You can reduce the number of germs on your skin by washing with CHG (chlorahexidine gluconate) soap before surgery.  CHG is an antiseptic cleaner which kills germs and bonds with the skin to continue killing germs even after washing. Please DO NOT use if you have an allergy to CHG or antibacterial soaps.  If your skin becomes reddened/irritated stop using the CHG and inform your nurse when you arrive at Short Stay. Do not shave (including legs and underarms) for at least 48 hours prior to the first CHG shower.  You may shave your face/neck. Please follow these instructions carefully:  1.  Shower with CHG Soap the night before surgery and the  morning of Surgery.  2.  If you choose to wash your hair, wash your hair first as usual with your  normal  shampoo.  3.  After you shampoo, rinse your hair and body thoroughly to remove the  shampoo.                           4.  Use CHG as you would any other liquid soap.  You can apply chg directly  to the skin and wash                       Gently with a scrungie or clean washcloth.  5.  Apply the CHG Soap to your body ONLY FROM THE NECK DOWN.   Do not use on face/ open                           Wound or open sores. Avoid contact with eyes, ears mouth and genitals (private parts).                       Wash face,  Genitals (private parts) with  your normal soap.             6.  Wash thoroughly, paying special attention to the area where your surgery  will be performed.  7.  Thoroughly rinse your body with warm water from the neck down.  8.  DO NOT shower/wash with your normal soap after using and rinsing off  the CHG Soap.                9.  Pat yourself  dry with a clean towel.            10.  Wear clean pajamas.            11.  Place clean sheets on your bed the night of your first shower and do not  sleep with pets. Day of Surgery : Do not apply any lotions/deodorants the morning of surgery.  Please wear clean clothes to the hospital/surgery center.  FAILURE TO FOLLOW THESE INSTRUCTIONS MAY RESULT IN THE CANCELLATION OF YOUR SURGERY PATIENT SIGNATURE_________________________________  NURSE SIGNATURE__________________________________  ________________________________________________________________________

## 2019-03-14 NOTE — Progress Notes (Signed)
Please place surgery orders. Pt is scheduled for his PAT appt tomorrow.

## 2019-03-15 ENCOUNTER — Encounter (HOSPITAL_COMMUNITY)
Admission: RE | Admit: 2019-03-15 | Discharge: 2019-03-15 | Disposition: A | Discharge status: Home / Self Care | Payer: Medicare Other | Source: Ambulatory Visit | Attending: Urology | Admitting: Urology

## 2019-03-15 ENCOUNTER — Encounter (HOSPITAL_COMMUNITY): Payer: Self-pay

## 2019-03-15 ENCOUNTER — Other Ambulatory Visit: Payer: Self-pay | Admitting: Urology

## 2019-03-15 ENCOUNTER — Other Ambulatory Visit: Payer: Self-pay

## 2019-03-15 DIAGNOSIS — I129 Hypertensive chronic kidney disease with stage 1 through stage 4 chronic kidney disease, or unspecified chronic kidney disease: Secondary | ICD-10-CM | POA: Insufficient documentation

## 2019-03-15 DIAGNOSIS — Z8572 Personal history of non-Hodgkin lymphomas: Secondary | ICD-10-CM | POA: Diagnosis not present

## 2019-03-15 DIAGNOSIS — E785 Hyperlipidemia, unspecified: Secondary | ICD-10-CM | POA: Insufficient documentation

## 2019-03-15 DIAGNOSIS — I2510 Atherosclerotic heart disease of native coronary artery without angina pectoris: Secondary | ICD-10-CM | POA: Diagnosis not present

## 2019-03-15 DIAGNOSIS — Z79899 Other long term (current) drug therapy: Secondary | ICD-10-CM | POA: Insufficient documentation

## 2019-03-15 DIAGNOSIS — C61 Malignant neoplasm of prostate: Secondary | ICD-10-CM | POA: Diagnosis not present

## 2019-03-15 DIAGNOSIS — Z85828 Personal history of other malignant neoplasm of skin: Secondary | ICD-10-CM | POA: Insufficient documentation

## 2019-03-15 DIAGNOSIS — Z7901 Long term (current) use of anticoagulants: Secondary | ICD-10-CM | POA: Diagnosis not present

## 2019-03-15 DIAGNOSIS — Z9049 Acquired absence of other specified parts of digestive tract: Secondary | ICD-10-CM | POA: Insufficient documentation

## 2019-03-15 DIAGNOSIS — I480 Paroxysmal atrial fibrillation: Secondary | ICD-10-CM | POA: Diagnosis not present

## 2019-03-15 DIAGNOSIS — Z794 Long term (current) use of insulin: Secondary | ICD-10-CM | POA: Diagnosis not present

## 2019-03-15 DIAGNOSIS — E1122 Type 2 diabetes mellitus with diabetic chronic kidney disease: Secondary | ICD-10-CM | POA: Insufficient documentation

## 2019-03-15 DIAGNOSIS — M109 Gout, unspecified: Secondary | ICD-10-CM | POA: Insufficient documentation

## 2019-03-15 DIAGNOSIS — N189 Chronic kidney disease, unspecified: Secondary | ICD-10-CM | POA: Insufficient documentation

## 2019-03-15 DIAGNOSIS — G4733 Obstructive sleep apnea (adult) (pediatric): Secondary | ICD-10-CM | POA: Insufficient documentation

## 2019-03-15 DIAGNOSIS — R9431 Abnormal electrocardiogram [ECG] [EKG]: Secondary | ICD-10-CM | POA: Insufficient documentation

## 2019-03-15 DIAGNOSIS — Z79818 Long term (current) use of other agents affecting estrogen receptors and estrogen levels: Secondary | ICD-10-CM | POA: Diagnosis not present

## 2019-03-15 DIAGNOSIS — Z01818 Encounter for other preprocedural examination: Secondary | ICD-10-CM | POA: Insufficient documentation

## 2019-03-15 LAB — CBC
HCT: 29 % — ABNORMAL LOW (ref 39.0–52.0)
Hemoglobin: 8.9 g/dL — ABNORMAL LOW (ref 13.0–17.0)
MCH: 26.9 pg (ref 26.0–34.0)
MCHC: 30.7 g/dL (ref 30.0–36.0)
MCV: 87.6 fL (ref 80.0–100.0)
Platelets: 143 10*3/uL — ABNORMAL LOW (ref 150–400)
RBC: 3.31 MIL/uL — ABNORMAL LOW (ref 4.22–5.81)
RDW: 16.9 % — ABNORMAL HIGH (ref 11.5–15.5)
WBC: 5 10*3/uL (ref 4.0–10.5)
nRBC: 0 % (ref 0.0–0.2)

## 2019-03-15 LAB — HEMOGLOBIN A1C
Hgb A1c MFr Bld: 7.4 % — ABNORMAL HIGH (ref 4.8–5.6)
Mean Plasma Glucose: 165.68 mg/dL

## 2019-03-15 LAB — BASIC METABOLIC PANEL
Anion gap: 8 (ref 5–15)
BUN: 30 mg/dL — ABNORMAL HIGH (ref 8–23)
CO2: 24 mmol/L (ref 22–32)
Calcium: 9.1 mg/dL (ref 8.9–10.3)
Chloride: 105 mmol/L (ref 98–111)
Creatinine, Ser: 1.21 mg/dL (ref 0.61–1.24)
GFR calc Af Amer: >60 mL/min (ref >60)
GFR calc non Af Amer: 54 mL/min — ABNORMAL LOW (ref >60)
Glucose, Bld: 210 mg/dL — ABNORMAL HIGH (ref 70–99)
Potassium: 4 mmol/L (ref 3.5–5.1)
Sodium: 137 mmol/L (ref 135–145)

## 2019-03-15 LAB — GLUCOSE, CAPILLARY: Glucose-Capillary: 182 mg/dL — ABNORMAL HIGH (ref 70–99)

## 2019-03-15 LAB — NOVEL CORONAVIRUS, NAA (HOSP ORDER, SEND-OUT TO REF LAB; TAT 18-24 HRS): SARS-CoV-2, NAA: NOT DETECTED

## 2019-03-15 NOTE — Progress Notes (Signed)
Left message for [facility] requesting orders.
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
## 2019-03-15 NOTE — Telephone Encounter (Signed)
Called and spoke with Linna Hoff from [facility] stated that the order did go out on November 13th, 2020 and Linna Hoff stated that he would call the patient for me and I stated that would be good. [name]
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
## 2019-03-15 NOTE — Anesthesia Preprocedure Evaluation (Addendum)
Anesthesia Evaluation  Patient identified by MRN, date of birth, ID band Patient awake    Reviewed: Allergy & Precautions, NPO status , Patient's Chart, lab work & pertinent test results  History of Anesthesia Complications Negative for: history of anesthetic complications  Airway Mallampati: II  TM Distance: >3 FB Neck ROM: Full    Dental  (+) Edentulous Upper, Upper Dentures, Partial Lower, Missing, Poor Dentition, Dental Advisory Given   Pulmonary sleep apnea ,    Pulmonary exam normal        Cardiovascular hypertension, + CAD  + dysrhythmias Atrial Fibrillation + Valvular Problems/Murmurs  Rhythm:Regular Rate:Normal  EKG: 11/15/17 Rate 70 bpm Normal sinus rhythm  Inferior infarct, age undetermined Anterior infarct, age undetermined  CV: Echo 10/10/17 Study Conclusions  - Left ventricle: The cavity size was normal. Wall thickness was increased in a pattern of mild LVH. Systolic function was vigorous. The estimated ejection fraction was in the range of 65% to 70%. Wall motion was normal; there were no regional wall motion abnormalities. Doppler parameters are consistent with abnormal left ventricular relaxation (grade 1 diastolic dysfunction). The E/e&' ratio is between 8-15, suggesting indeterminate LV filling pressure. - Aortic valve: Sclerosis without stenosis. There was no regurgitation. - Mitral valve: Mildly thickened leaflets . There was trivial regurgitation. - Left atrium: The atrium was normal in size. - Inferior vena cava: The vessel was normal in size. The respirophasic diameter changes were in the normal range (>= 50%), consistent with normal central venous pressure.  Impressions:  - Compared to a prior study in 2016, the LVEF is higher at 65-70%. The aortic valve is sclerotic, but not stenotic.   Neuro/Psych negative neurological ROS  negative psych ROS    GI/Hepatic Neg liver ROS, GERD  Medicated,  Endo/Other  diabetes, Type 2  Renal/GU CRFRenal disease  negative genitourinary   Musculoskeletal negative musculoskeletal ROS (+)   Abdominal   Peds negative pediatric ROS (+)  Hematology  (+) Blood dyscrasia, anemia , Non hodgkin's lymphoma   Anesthesia Other Findings   Reproductive/Obstetrics negative OB ROS                            Anesthesia Physical  Anesthesia Plan  ASA: IV  Anesthesia Plan: General   Post-op Pain Management:    Induction: Intravenous  PONV Risk Score and Plan: 2 and Ondansetron, Treatment may vary due to age or medical condition and Dexamethasone  Airway Management Planned: Oral ETT and LMA  Additional Equipment:   Intra-op Plan:   Post-operative Plan: Extubation in OR  Informed Consent: I have reviewed the patients History and Physical, chart, labs and discussed the procedure including the risks, benefits and alternatives for the proposed anesthesia with the patient or authorized representative who has indicated his/her understanding and acceptance.     Dental advisory given  Plan Discussed with: CRNA and Anesthesiologist  Anesthesia Plan Comments:        Anesthesia Quick Evaluation

## 2019-03-18 ENCOUNTER — Ambulatory Visit (HOSPITAL_COMMUNITY): Payer: Medicare Other | Admitting: Physician Assistant

## 2019-03-18 ENCOUNTER — Encounter (HOSPITAL_COMMUNITY)
Admission: RE | Disposition: A | Discharge status: Home / Self Care | Payer: Self-pay | Source: Ambulatory Visit | Attending: Urology

## 2019-03-18 ENCOUNTER — Ambulatory Visit (HOSPITAL_COMMUNITY): Payer: Medicare Other | Admitting: Anesthesiology

## 2019-03-18 ENCOUNTER — Other Ambulatory Visit: Payer: Self-pay

## 2019-03-18 ENCOUNTER — Encounter (HOSPITAL_COMMUNITY): Payer: Self-pay | Admitting: Emergency Medicine

## 2019-03-18 ENCOUNTER — Ambulatory Visit (HOSPITAL_COMMUNITY): Payer: Medicare Other

## 2019-03-18 ENCOUNTER — Ambulatory Visit (HOSPITAL_COMMUNITY)
Admission: RE | Admit: 2019-03-18 | Discharge: 2019-03-18 | Disposition: A | Discharge status: Home / Self Care | Payer: Medicare Other | Source: Ambulatory Visit | Attending: Urology | Admitting: Urology

## 2019-03-18 DIAGNOSIS — Z794 Long term (current) use of insulin: Secondary | ICD-10-CM | POA: Insufficient documentation

## 2019-03-18 DIAGNOSIS — I2510 Atherosclerotic heart disease of native coronary artery without angina pectoris: Secondary | ICD-10-CM | POA: Diagnosis not present

## 2019-03-18 DIAGNOSIS — Z8249 Family history of ischemic heart disease and other diseases of the circulatory system: Secondary | ICD-10-CM | POA: Insufficient documentation

## 2019-03-18 DIAGNOSIS — M1990 Unspecified osteoarthritis, unspecified site: Secondary | ICD-10-CM | POA: Diagnosis not present

## 2019-03-18 DIAGNOSIS — Z87891 Personal history of nicotine dependence: Secondary | ICD-10-CM | POA: Diagnosis not present

## 2019-03-18 DIAGNOSIS — I480 Paroxysmal atrial fibrillation: Secondary | ICD-10-CM | POA: Diagnosis not present

## 2019-03-18 DIAGNOSIS — Z833 Family history of diabetes mellitus: Secondary | ICD-10-CM | POA: Insufficient documentation

## 2019-03-18 DIAGNOSIS — I10 Essential (primary) hypertension: Secondary | ICD-10-CM | POA: Diagnosis not present

## 2019-03-18 DIAGNOSIS — N131 Hydronephrosis with ureteral stricture, not elsewhere classified: Secondary | ICD-10-CM | POA: Diagnosis not present

## 2019-03-18 DIAGNOSIS — Z9049 Acquired absence of other specified parts of digestive tract: Secondary | ICD-10-CM | POA: Diagnosis not present

## 2019-03-18 DIAGNOSIS — Z8572 Personal history of non-Hodgkin lymphomas: Secondary | ICD-10-CM | POA: Diagnosis not present

## 2019-03-18 DIAGNOSIS — C7951 Secondary malignant neoplasm of bone: Secondary | ICD-10-CM | POA: Insufficient documentation

## 2019-03-18 DIAGNOSIS — Z888 Allergy status to other drugs, medicaments and biological substances status: Secondary | ICD-10-CM | POA: Diagnosis not present

## 2019-03-18 DIAGNOSIS — Z9221 Personal history of antineoplastic chemotherapy: Secondary | ICD-10-CM | POA: Insufficient documentation

## 2019-03-18 DIAGNOSIS — E119 Type 2 diabetes mellitus without complications: Secondary | ICD-10-CM | POA: Diagnosis not present

## 2019-03-18 DIAGNOSIS — Z7901 Long term (current) use of anticoagulants: Secondary | ICD-10-CM | POA: Diagnosis not present

## 2019-03-18 DIAGNOSIS — R0681 Apnea, not elsewhere classified: Secondary | ICD-10-CM | POA: Diagnosis not present

## 2019-03-18 DIAGNOSIS — E7800 Pure hypercholesterolemia, unspecified: Secondary | ICD-10-CM | POA: Insufficient documentation

## 2019-03-18 DIAGNOSIS — N135 Crossing vessel and stricture of ureter without hydronephrosis: Secondary | ICD-10-CM | POA: Diagnosis not present

## 2019-03-18 DIAGNOSIS — C61 Malignant neoplasm of prostate: Secondary | ICD-10-CM | POA: Insufficient documentation

## 2019-03-18 DIAGNOSIS — Z79899 Other long term (current) drug therapy: Secondary | ICD-10-CM | POA: Insufficient documentation

## 2019-03-18 HISTORY — PX: CYSTOSCOPY WITH STENT PLACEMENT: SHX5790

## 2019-03-18 LAB — BASIC METABOLIC PANEL
Anion gap: 11 (ref 5–15)
BUN: 27 mg/dL — ABNORMAL HIGH (ref 8–23)
CO2: 20 mmol/L — ABNORMAL LOW (ref 22–32)
Calcium: 9.3 mg/dL (ref 8.9–10.3)
Chloride: 107 mmol/L (ref 98–111)
Creatinine, Ser: 1.16 mg/dL (ref 0.61–1.24)
GFR calc Af Amer: >60 mL/min (ref >60)
GFR calc non Af Amer: 57 mL/min — ABNORMAL LOW (ref >60)
Glucose, Bld: 177 mg/dL — ABNORMAL HIGH (ref 70–99)
Potassium: 4.1 mmol/L (ref 3.5–5.1)
Sodium: 138 mmol/L (ref 135–145)

## 2019-03-18 LAB — GLUCOSE, CAPILLARY
Glucose-Capillary: 153 mg/dL — ABNORMAL HIGH (ref 70–99)
Glucose-Capillary: 166 mg/dL — ABNORMAL HIGH (ref 70–99)

## 2019-03-18 SURGERY — CYSTOSCOPY, WITH STENT INSERTION
Anesthesia: General | Site: Urethra | Laterality: Bilateral

## 2019-03-18 MED ORDER — DEXAMETHASONE SODIUM PHOSPHATE 10 MG/ML IJ SOLN
INTRAMUSCULAR | Status: AC
  Filled 2019-03-18: qty 1

## 2019-03-18 MED ORDER — FENTANYL CITRATE (PF) 100 MCG/2ML IJ SOLN: 25.0000 ug | INTRAMUSCULAR | Status: DC | PRN

## 2019-03-18 MED ORDER — ONDANSETRON HCL 4 MG/2ML IJ SOLN
INTRAMUSCULAR | Status: DC | PRN
  Administered 2019-03-18: 4 mg via INTRAVENOUS

## 2019-03-18 MED ORDER — EPHEDRINE SULFATE-NACL 50-0.9 MG/10ML-% IV SOSY
PREFILLED_SYRINGE | INTRAVENOUS | Status: DC | PRN
  Administered 2019-03-18: 10 mg via INTRAVENOUS

## 2019-03-18 MED ORDER — FENTANYL CITRATE (PF) 100 MCG/2ML IJ SOLN
INTRAMUSCULAR | Status: DC | PRN
  Administered 2019-03-18 (×2): 25 ug via INTRAVENOUS

## 2019-03-18 MED ORDER — FENTANYL CITRATE (PF) 100 MCG/2ML IJ SOLN
INTRAMUSCULAR | Status: AC
  Filled 2019-03-18: qty 2

## 2019-03-18 MED ORDER — ONDANSETRON HCL 4 MG/2ML IJ SOLN
INTRAMUSCULAR | Status: AC
  Filled 2019-03-18: qty 2

## 2019-03-18 MED ORDER — PROMETHAZINE HCL 25 MG/ML IJ SOLN: 6.2500 mg | INTRAMUSCULAR | Status: DC | PRN

## 2019-03-18 MED ORDER — LACTATED RINGERS IV SOLN
INTRAVENOUS | Status: DC
  Administered 2019-03-18: 08:00:00 via INTRAVENOUS

## 2019-03-18 MED ORDER — LIDOCAINE 2% (20 MG/ML) 5 ML SYRINGE
INTRAMUSCULAR | Status: AC
  Filled 2019-03-18: qty 5

## 2019-03-18 MED ORDER — ACETAMINOPHEN 500 MG PO TABS
1000.0000 mg | ORAL_TABLET | Freq: Once | ORAL | Status: AC
  Administered 2019-03-18: 1000 mg via ORAL
  Filled 2019-03-18: qty 2

## 2019-03-18 MED ORDER — EPHEDRINE 5 MG/ML INJ
INTRAVENOUS | Status: AC
  Filled 2019-03-18: qty 10

## 2019-03-18 MED ORDER — PROPOFOL 10 MG/ML IV BOLUS
INTRAVENOUS | Status: DC | PRN
  Administered 2019-03-18: 130 mg via INTRAVENOUS

## 2019-03-18 MED ORDER — GLYCOPYRROLATE PF 0.2 MG/ML IJ SOSY
PREFILLED_SYRINGE | INTRAMUSCULAR | Status: AC
  Filled 2019-03-18: qty 1

## 2019-03-18 MED ORDER — CELECOXIB 200 MG PO CAPS
200.0000 mg | ORAL_CAPSULE | Freq: Once | ORAL | Status: AC
  Administered 2019-03-18: 400 mg via ORAL
  Filled 2019-03-18: qty 1

## 2019-03-18 MED ORDER — CEFAZOLIN SODIUM-DEXTROSE 2-4 GM/100ML-% IV SOLN
2.0000 g | Freq: Once | INTRAVENOUS | Status: AC
  Administered 2019-03-18: 2 g via INTRAVENOUS
  Filled 2019-03-18: qty 100

## 2019-03-18 MED ORDER — LIDOCAINE 2% (20 MG/ML) 5 ML SYRINGE
INTRAMUSCULAR | Status: DC | PRN
  Administered 2019-03-18: 100 mg via INTRAVENOUS

## 2019-03-18 SURGICAL SUPPLY — 13 items
BAG URO CATCHER STRL LF (MISCELLANEOUS) ×3 IMPLANT
CATH INTERMIT  6FR 70CM (CATHETERS) ×1 IMPLANT
CLOTH BEACON ORANGE TIMEOUT ST (SAFETY) ×1 IMPLANT
GLOVE BIOGEL M STRL SZ7.5 (GLOVE) ×3 IMPLANT
GOWN STRL REUS W/TWL LRG LVL3 (GOWN DISPOSABLE) ×4 IMPLANT
GUIDEWIRE STR DUAL SENSOR (WIRE) ×5 IMPLANT
KIT TURNOVER KIT A (KITS) ×2 IMPLANT
MANIFOLD NEPTUNE II (INSTRUMENTS) ×3 IMPLANT
PACK CYSTO (CUSTOM PROCEDURE TRAY) ×3 IMPLANT
STENT URO INLAY 6FRX24CM (STENTS) ×4 IMPLANT
TUBING CONNECTING 10 (TUBING) ×2 IMPLANT
TUBING CONNECTING 10' (TUBING) ×1
TUBING UROLOGY SET (TUBING) IMPLANT

## 2019-03-18 NOTE — Anesthesia Procedure Notes (Signed)
Procedure Name: LMA Insertion Date/Time: 03/18/2019 8:53 AM Performed by: Gerald Leitz, CRNA Pre-anesthesia Checklist: Patient identified, Patient being monitored, Timeout performed, Emergency Drugs available and Suction available Patient Re-evaluated:Patient Re-evaluated prior to induction Oxygen Delivery Method: Circle system utilized Preoxygenation: Pre-oxygenation with 100% oxygen Induction Type: IV induction Ventilation: Mask ventilation without difficulty LMA: LMA inserted LMA Size: 4.0 Tube type: Oral Number of attempts: 1 Placement Confirmation: positive ETCO2 and breath sounds checked- equal and bilateral Tube secured with: Tape Dental Injury: Teeth and Oropharynx as per pre-operative assessment

## 2019-03-18 NOTE — Anesthesia Postprocedure Evaluation (Signed)
Anesthesia Post Note  Patient: [name]  Procedure(s) Performed: CYSTOSCOPY WITH STENT CHANGE (Bilateral Urethra)     Patient location during evaluation: PACU Anesthesia Type: General Level of consciousness: sedated Pain management: pain level controlled Vital Signs Assessment: post-procedure vital signs reviewed and stable Respiratory status: spontaneous breathing and respiratory function stable Cardiovascular status: stable Postop Assessment: no apparent nausea or vomiting Anesthetic complications: no    Last Vitals:  Vitals:   03/18/19 1000 03/18/19 1008  BP: 110/82 (!) 133/57  Pulse: 62 71  Resp: 15 20  Temp: (!) 36.2 C (!) 36.2 C  SpO2: 98% 100%    Last Pain:  Vitals:   03/18/19 1008  TempSrc: Oral  PainSc: 0-No pain                 Shylyn Younce DANIEL

## 2019-03-18 NOTE — Discharge Instructions (Signed)

## 2019-03-18 NOTE — Op Note (Signed)
Preoperative diagnosis: 1. Metastatic prostate cancer 2. Bilateral ureteral obstruction  Postoperative diagnosis: 1. Metastatic prostate cancer 2. Bilateral ureteral obstruction  Procedure:  1. Cystoscopy 2. Bilateralureteral stent placement (6 x 24 [employer] Inlay Optima)  Surgeon: Roxy Horseman, Brooke Bonito. M.D.  Anesthesia: General  Complications: None  EBL: Minimal  Specimens: None  Intraoperative findings: Stents were moderately encrusted.  Indication:Michael C Greesonis a83 y.o.patient with bilateral ureteral obstruction. After reviewing the management options for treatment, he elected to proceed with the above surgical procedure(s). We have discussed the potential benefits and risks of the procedure, side effects of the proposed treatment, the likelihood of the patient achieving the goals of the procedure, and any potential problems that might occur during the procedure or recuperation. Informed consent has been obtained.  Description of procedure:  The patient was taken to the operating room and general anesthesia was induced. The patient was placed in the dorsal lithotomy position, prepped and draped in the usual sterile fashion, and preoperative antibiotics were administered. A preoperative time-out was performed.   Cystourethroscopy was performed. The patient's urethra was examined and was unremarkable. The bladder was then systematically examined in its entirety. There was no evidence for any bladder tumors, stones, or other mucosal pathology.   Attention then turned to therightureteral orifice and the patient's indwelling ureteral stent was identified and brought out to the urethral meatus with the flexible graspers.  A 0.38 sensor guidewire was then advanced up therightureter into the renal pelvis under fluoroscopic guidance. The wire was then backloaded through the cystoscope and a ureteral stent was advance over the wire using Seldinger  technique. The stent was positioned appropriately under fluoroscopic and cystoscopic guidanceafter a retrograde pyelogram was performed to confirm appropriate position in the renal collecting system. The wire was then removed with an adequate stent curl noted in the renal pelvis as well as in the bladder.  Attention then turned to theleftureteral orifice and the patient's indwelling ureteral stent was identified and brought out to the urethral meatus with the flexible graspers.  A 0.38 sensor guidewire was then advanced up theleftureter into the renal pelvis under fluoroscopic guidance. The wire was then backloaded through the cystoscope and a ureteral stent was advance over the wire using Seldinger technique. The stent was positioned appropriately under fluoroscopic and cystoscopic guidanceafter confirming the appropriate position with a retrograde pyelogram as above.The wire was then removed with an adequate stent curl noted in the renal pelvis as well as in the bladder.  The bladder was then emptied and the procedure ended. The patient appeared to tolerate the procedure well and without complications. The patient was able to be awakened and transferred to the recovery unit in satisfactory condition.

## 2019-03-18 NOTE — Transfer of Care (Signed)
Immediate Anesthesia Transfer of Care Note  Patient: [name]  Procedure(s) Performed: Procedure(s): CYSTOSCOPY WITH STENT CHANGE (Bilateral)  Patient Location: PACU  Anesthesia Type:General  Level of Consciousness: Alert, Awake, Oriented  Airway & Oxygen Therapy: Patient Spontanous Breathing  Post-op Assessment: Report given to RN  Post vital signs: Reviewed and stable  Last Vitals:  Vitals:   03/18/19 0710  BP: 103/88  Pulse: 71  Resp: 16  Temp: 36.5 C  SpO2: 91%    Complications: No apparent anesthesia complications

## 2019-03-19 ENCOUNTER — Encounter (HOSPITAL_COMMUNITY): Payer: Self-pay | Admitting: Urology

## 2019-03-25 ENCOUNTER — Telehealth: Payer: Self-pay | Admitting: Oncology

## 2019-03-25 NOTE — Telephone Encounter (Signed)
Returned patient's phone call regarding rescheduling an appointment, left a voicemail. 

## 2019-03-26 ENCOUNTER — Other Ambulatory Visit: Payer: Medicare Other | Admitting: Orthotics

## 2019-03-26 ENCOUNTER — Ambulatory Visit: Payer: Medicare Other | Admitting: Podiatry

## 2019-03-27 ENCOUNTER — Telehealth: Payer: Self-pay

## 2019-03-27 NOTE — Telephone Encounter (Signed)
Pt's daughter called to report patient had exposure to someone that tested positive for COVID 19.  Requesting appointments to be rescheduled.    RN sent scheduling message to reschedule for appropriate date.

## 2019-03-29 ENCOUNTER — Inpatient Hospital Stay: Payer: Medicare Other

## 2019-03-29 ENCOUNTER — Inpatient Hospital Stay: Payer: Medicare Other | Admitting: Oncology

## 2019-03-29 ENCOUNTER — Telehealth: Payer: Self-pay | Admitting: Oncology

## 2019-03-29 NOTE — Telephone Encounter (Signed)
Confirmed 12/11 appointments with dtr [name] per 12/2 schedule message.

## 2019-04-01 ENCOUNTER — Telehealth: Payer: Self-pay | Admitting: Oncology

## 2019-04-01 NOTE — Telephone Encounter (Signed)
R/s appt per 12/4 sch message- pt daughter [name] is aware of appt date and time for 1/5

## 2019-04-03 ENCOUNTER — Inpatient Hospital Stay (HOSPITAL_COMMUNITY)
Admission: EM | Admit: 2019-04-03 | Discharge: 2019-04-06 | DRG: 177 | Disposition: A | Discharge status: Home / Self Care | Payer: Medicare Other | Attending: Student | Admitting: Student

## 2019-04-03 ENCOUNTER — Other Ambulatory Visit: Payer: Self-pay

## 2019-04-03 ENCOUNTER — Emergency Department (HOSPITAL_COMMUNITY): Payer: Medicare Other

## 2019-04-03 ENCOUNTER — Encounter (HOSPITAL_COMMUNITY): Payer: Self-pay | Admitting: Emergency Medicine

## 2019-04-03 DIAGNOSIS — Z8546 Personal history of malignant neoplasm of prostate: Secondary | ICD-10-CM | POA: Diagnosis not present

## 2019-04-03 DIAGNOSIS — Z7901 Long term (current) use of anticoagulants: Secondary | ICD-10-CM | POA: Diagnosis not present

## 2019-04-03 DIAGNOSIS — Z794 Long term (current) use of insulin: Secondary | ICD-10-CM | POA: Diagnosis not present

## 2019-04-03 DIAGNOSIS — G4733 Obstructive sleep apnea (adult) (pediatric): Secondary | ICD-10-CM | POA: Diagnosis present

## 2019-04-03 DIAGNOSIS — I2510 Atherosclerotic heart disease of native coronary artery without angina pectoris: Secondary | ICD-10-CM | POA: Diagnosis present

## 2019-04-03 DIAGNOSIS — J1289 Other viral pneumonia: Secondary | ICD-10-CM | POA: Diagnosis present

## 2019-04-03 DIAGNOSIS — I129 Hypertensive chronic kidney disease with stage 1 through stage 4 chronic kidney disease, or unspecified chronic kidney disease: Secondary | ICD-10-CM | POA: Diagnosis present

## 2019-04-03 DIAGNOSIS — E119 Type 2 diabetes mellitus without complications: Secondary | ICD-10-CM

## 2019-04-03 DIAGNOSIS — E785 Hyperlipidemia, unspecified: Secondary | ICD-10-CM | POA: Diagnosis present

## 2019-04-03 DIAGNOSIS — U071 COVID-19: Principal | ICD-10-CM

## 2019-04-03 DIAGNOSIS — D649 Anemia, unspecified: Secondary | ICD-10-CM | POA: Diagnosis not present

## 2019-04-03 DIAGNOSIS — E1140 Type 2 diabetes mellitus with diabetic neuropathy, unspecified: Secondary | ICD-10-CM | POA: Diagnosis present

## 2019-04-03 DIAGNOSIS — K8689 Other specified diseases of pancreas: Secondary | ICD-10-CM | POA: Diagnosis present

## 2019-04-03 DIAGNOSIS — R0602 Shortness of breath: Secondary | ICD-10-CM | POA: Diagnosis not present

## 2019-04-03 DIAGNOSIS — E10621 Type 1 diabetes mellitus with foot ulcer: Secondary | ICD-10-CM | POA: Diagnosis not present

## 2019-04-03 DIAGNOSIS — C61 Malignant neoplasm of prostate: Secondary | ICD-10-CM | POA: Diagnosis not present

## 2019-04-03 DIAGNOSIS — Z8505 Personal history of malignant neoplasm of liver: Secondary | ICD-10-CM | POA: Diagnosis not present

## 2019-04-03 DIAGNOSIS — I480 Paroxysmal atrial fibrillation: Secondary | ICD-10-CM | POA: Diagnosis present

## 2019-04-03 DIAGNOSIS — J1282 Pneumonia due to coronavirus disease 2019: Secondary | ICD-10-CM

## 2019-04-03 DIAGNOSIS — N183 Chronic kidney disease, stage 3 unspecified: Secondary | ICD-10-CM | POA: Diagnosis present

## 2019-04-03 DIAGNOSIS — C8590 Non-Hodgkin lymphoma, unspecified, unspecified site: Secondary | ICD-10-CM | POA: Diagnosis present

## 2019-04-03 DIAGNOSIS — Z79818 Long term (current) use of other agents affecting estrogen receptors and estrogen levels: Secondary | ICD-10-CM

## 2019-04-03 DIAGNOSIS — F329 Major depressive disorder, single episode, unspecified: Secondary | ICD-10-CM | POA: Diagnosis present

## 2019-04-03 DIAGNOSIS — J9601 Acute respiratory failure with hypoxia: Secondary | ICD-10-CM | POA: Diagnosis present

## 2019-04-03 DIAGNOSIS — Z79899 Other long term (current) drug therapy: Secondary | ICD-10-CM

## 2019-04-03 DIAGNOSIS — R0902 Hypoxemia: Secondary | ICD-10-CM | POA: Diagnosis not present

## 2019-04-03 DIAGNOSIS — Z9221 Personal history of antineoplastic chemotherapy: Secondary | ICD-10-CM

## 2019-04-03 DIAGNOSIS — D61818 Other pancytopenia: Secondary | ICD-10-CM | POA: Diagnosis present

## 2019-04-03 DIAGNOSIS — Z85828 Personal history of other malignant neoplasm of skin: Secondary | ICD-10-CM

## 2019-04-03 DIAGNOSIS — E1122 Type 2 diabetes mellitus with diabetic chronic kidney disease: Secondary | ICD-10-CM | POA: Diagnosis present

## 2019-04-03 DIAGNOSIS — Z8572 Personal history of non-Hodgkin lymphomas: Secondary | ICD-10-CM | POA: Diagnosis not present

## 2019-04-03 DIAGNOSIS — Z66 Do not resuscitate: Secondary | ICD-10-CM | POA: Diagnosis present

## 2019-04-03 DIAGNOSIS — D500 Iron deficiency anemia secondary to blood loss (chronic): Secondary | ICD-10-CM | POA: Diagnosis not present

## 2019-04-03 DIAGNOSIS — E1165 Type 2 diabetes mellitus with hyperglycemia: Secondary | ICD-10-CM | POA: Diagnosis present

## 2019-04-03 DIAGNOSIS — H409 Unspecified glaucoma: Secondary | ICD-10-CM | POA: Diagnosis present

## 2019-04-03 DIAGNOSIS — L97429 Non-pressure chronic ulcer of left heel and midfoot with unspecified severity: Secondary | ICD-10-CM | POA: Diagnosis not present

## 2019-04-03 LAB — COMPREHENSIVE METABOLIC PANEL
ALT: 19 U/L (ref 0–44)
AST: 33 U/L (ref 15–41)
Albumin: 2.9 g/dL — ABNORMAL LOW (ref 3.5–5.0)
Alkaline Phosphatase: 43 U/L (ref 38–126)
Anion gap: 8 (ref 5–15)
BUN: 26 mg/dL — ABNORMAL HIGH (ref 8–23)
CO2: 24 mmol/L (ref 22–32)
Calcium: 8.6 mg/dL — ABNORMAL LOW (ref 8.9–10.3)
Chloride: 104 mmol/L (ref 98–111)
Creatinine, Ser: 1.22 mg/dL (ref 0.61–1.24)
GFR calc Af Amer: >60 mL/min (ref >60)
GFR calc non Af Amer: 54 mL/min — ABNORMAL LOW (ref >60)
Glucose, Bld: 140 mg/dL — ABNORMAL HIGH (ref 70–99)
Potassium: 3.8 mmol/L (ref 3.5–5.1)
Sodium: 136 mmol/L (ref 135–145)
Total Bilirubin: 0.4 mg/dL (ref 0.3–1.2)
Total Protein: 5.8 g/dL — ABNORMAL LOW (ref 6.5–8.1)

## 2019-04-03 LAB — CBC WITH DIFFERENTIAL/PLATELET
Abs Immature Granulocytes: 0.07 10*3/uL (ref 0.00–0.07)
Basophils Absolute: 0 10*3/uL (ref 0.0–0.1)
Basophils Relative: 0 %
Eosinophils Absolute: 0 10*3/uL (ref 0.0–0.5)
Eosinophils Relative: 1 %
HCT: 23 % — ABNORMAL LOW (ref 39.0–52.0)
Hemoglobin: 7 g/dL — ABNORMAL LOW (ref 13.0–17.0)
Immature Granulocytes: 2 %
Lymphocytes Relative: 5 %
Lymphs Abs: 0.2 10*3/uL — ABNORMAL LOW (ref 0.7–4.0)
MCH: 26.2 pg (ref 26.0–34.0)
MCHC: 30.4 g/dL (ref 30.0–36.0)
MCV: 86.1 fL (ref 80.0–100.0)
Monocytes Absolute: 0.3 10*3/uL (ref 0.1–1.0)
Monocytes Relative: 9 %
Neutro Abs: 2.7 10*3/uL (ref 1.7–7.7)
Neutrophils Relative %: 83 %
Platelets: 110 10*3/uL — ABNORMAL LOW (ref 150–400)
RBC: 2.67 MIL/uL — ABNORMAL LOW (ref 4.22–5.81)
RDW: 16.1 % — ABNORMAL HIGH (ref 11.5–15.5)
WBC: 3.3 10*3/uL — ABNORMAL LOW (ref 4.0–10.5)
nRBC: 0 % (ref 0.0–0.2)

## 2019-04-03 LAB — FIBRINOGEN: Fibrinogen: 526 mg/dL — ABNORMAL HIGH (ref 210–475)

## 2019-04-03 LAB — C-REACTIVE PROTEIN: CRP: 6.2 mg/dL — ABNORMAL HIGH

## 2019-04-03 LAB — POC SARS CORONAVIRUS 2 AG -  ED: SARS Coronavirus 2 Ag: POSITIVE — AB

## 2019-04-03 LAB — LACTATE DEHYDROGENASE: LDH: 131 U/L (ref 98–192)

## 2019-04-03 LAB — ABO/RH: ABO/RH(D): A POS

## 2019-04-03 LAB — TRIGLYCERIDES: Triglycerides: 196 mg/dL — ABNORMAL HIGH

## 2019-04-03 LAB — D-DIMER, QUANTITATIVE: D-Dimer, Quant: 0.58 ug{FEU}/mL — ABNORMAL HIGH (ref 0.00–0.50)

## 2019-04-03 LAB — FERRITIN: Ferritin: 44 ng/mL (ref 24–336)

## 2019-04-03 LAB — GLUCOSE, CAPILLARY: Glucose-Capillary: 214 mg/dL — ABNORMAL HIGH (ref 70–99)

## 2019-04-03 LAB — LACTIC ACID, PLASMA: Lactic Acid, Venous: 1.2 mmol/L (ref 0.5–1.9)

## 2019-04-03 LAB — PROCALCITONIN: Procalcitonin: 0.2 ng/mL

## 2019-04-03 MED ORDER — SODIUM CHLORIDE 0.9 % IV SOLN: 100.0000 mg | Freq: Every day | INTRAVENOUS | Status: DC

## 2019-04-03 MED ORDER — FENOFIBRATE 160 MG PO TABS
160.0000 mg | ORAL_TABLET | Freq: Every evening | ORAL | Status: DC
  Administered 2019-04-04 (×2): 160 mg via ORAL
  Filled 2019-04-03 (×2): qty 1

## 2019-04-03 MED ORDER — ACETAMINOPHEN 325 MG PO TABS
650.0000 mg | ORAL_TABLET | Freq: Four times a day (QID) | ORAL | Status: DC | PRN
  Administered 2019-04-05: 650 mg via ORAL
  Filled 2019-04-03 (×2): qty 2

## 2019-04-03 MED ORDER — ONDANSETRON HCL 4 MG/2ML IJ SOLN: 4.0000 mg | Freq: Four times a day (QID) | INTRAMUSCULAR | Status: DC | PRN

## 2019-04-03 MED ORDER — ATORVASTATIN CALCIUM 20 MG PO TABS
20.0000 mg | ORAL_TABLET | Freq: Every evening | ORAL | Status: DC
  Administered 2019-04-04 – 2019-04-05 (×3): 20 mg via ORAL
  Filled 2019-04-03 (×3): qty 1

## 2019-04-03 MED ORDER — SERTRALINE HCL 100 MG PO TABS
100.0000 mg | ORAL_TABLET | Freq: Every day | ORAL | Status: DC
  Administered 2019-04-04 – 2019-04-06 (×3): 100 mg via ORAL
  Filled 2019-04-03 (×3): qty 1

## 2019-04-03 MED ORDER — VITAMIN C 500 MG PO TABS
500.0000 mg | ORAL_TABLET | Freq: Every day | ORAL | Status: DC
  Administered 2019-04-04 – 2019-04-06 (×3): 500 mg via ORAL
  Filled 2019-04-03 (×3): qty 1

## 2019-04-03 MED ORDER — LATANOPROST 0.005 % OP SOLN
1.0000 [drp] | Freq: Every day | OPHTHALMIC | Status: DC
  Administered 2019-04-04 – 2019-04-05 (×2): 1 [drp] via OPHTHALMIC
  Filled 2019-04-03: qty 2.5

## 2019-04-03 MED ORDER — CHLORHEXIDINE GLUCONATE CLOTH 2 % EX PADS
6.0000 | MEDICATED_PAD | Freq: Every day | CUTANEOUS | Status: DC
  Administered 2019-04-04 – 2019-04-05 (×2): 6 via TOPICAL

## 2019-04-03 MED ORDER — LORATADINE 10 MG PO TABS
10.0000 mg | ORAL_TABLET | Freq: Every day | ORAL | Status: DC
  Administered 2019-04-04 – 2019-04-06 (×3): 10 mg via ORAL
  Filled 2019-04-03 (×3): qty 1

## 2019-04-03 MED ORDER — DEXAMETHASONE 4 MG PO TABS
6.0000 mg | ORAL_TABLET | ORAL | Status: DC
  Administered 2019-04-04 – 2019-04-06 (×3): 6 mg via ORAL
  Filled 2019-04-03 (×3): qty 2

## 2019-04-03 MED ORDER — VITAMIN B-12 1000 MCG PO TABS
2500.0000 ug | ORAL_TABLET | Freq: Every morning | ORAL | Status: DC
  Administered 2019-04-04 – 2019-04-06 (×3): 2500 ug via ORAL
  Filled 2019-04-03 (×3): qty 3

## 2019-04-03 MED ORDER — ONDANSETRON HCL 4 MG PO TABS: 4.0000 mg | ORAL_TABLET | Freq: Four times a day (QID) | ORAL | Status: DC | PRN

## 2019-04-03 MED ORDER — INSULIN GLARGINE 100 UNIT/ML ~~LOC~~ SOLN
10.0000 [IU] | Freq: Every day | SUBCUTANEOUS | Status: DC
  Administered 2019-04-04 – 2019-04-06 (×3): 10 [IU] via SUBCUTANEOUS
  Filled 2019-04-03 (×3): qty 0.1

## 2019-04-03 MED ORDER — SODIUM CHLORIDE 0.9% FLUSH: 10.0000 mL | INTRAVENOUS | Status: DC | PRN

## 2019-04-03 MED ORDER — MAGNESIUM OXIDE 400 (241.3 MG) MG PO TABS
400.0000 mg | ORAL_TABLET | Freq: Two times a day (BID) | ORAL | Status: DC
  Administered 2019-04-04 – 2019-04-06 (×5): 400 mg via ORAL
  Filled 2019-04-03 (×7): qty 1

## 2019-04-03 MED ORDER — RIVAROXABAN 15 MG PO TABS
15.0000 mg | ORAL_TABLET | Freq: Every day | ORAL | Status: DC
  Administered 2019-04-04 – 2019-04-06 (×3): 15 mg via ORAL
  Filled 2019-04-03 (×3): qty 1

## 2019-04-03 MED ORDER — INSULIN ASPART 100 UNIT/ML ~~LOC~~ SOLN
0.0000 [IU] | Freq: Three times a day (TID) | SUBCUTANEOUS | Status: DC
  Administered 2019-04-04: 3 [IU] via SUBCUTANEOUS
  Filled 2019-04-03: qty 0.09

## 2019-04-03 MED ORDER — ALLOPURINOL 300 MG PO TABS
300.0000 mg | ORAL_TABLET | Freq: Every day | ORAL | Status: DC
  Administered 2019-04-04 – 2019-04-06 (×3): 300 mg via ORAL
  Filled 2019-04-03 (×4): qty 1

## 2019-04-03 MED ORDER — ZINC SULFATE 220 (50 ZN) MG PO CAPS
220.0000 mg | ORAL_CAPSULE | Freq: Every day | ORAL | Status: DC
  Administered 2019-04-04 – 2019-04-06 (×3): 220 mg via ORAL
  Filled 2019-04-03 (×3): qty 1

## 2019-04-03 MED ORDER — PANCRELIPASE (LIP-PROT-AMYL) 12000-38000 UNITS PO CPEP
12000.0000 [IU] | ORAL_CAPSULE | Freq: Three times a day (TID) | ORAL | Status: DC
  Administered 2019-04-04 – 2019-04-06 (×8): 12000 [IU] via ORAL
  Filled 2019-04-03 (×7): qty 1

## 2019-04-03 MED ORDER — SODIUM CHLORIDE 0.9 % IV SOLN
200.0000 mg | Freq: Once | INTRAVENOUS | Status: AC
  Administered 2019-04-03: 200 mg via INTRAVENOUS
  Filled 2019-04-03: qty 200

## 2019-04-03 MED ORDER — SODIUM CHLORIDE 0.9 % IV SOLN
100.0000 mg | Freq: Every day | INTRAVENOUS | Status: DC
  Administered 2019-04-04 – 2019-04-06 (×3): 100 mg via INTRAVENOUS
  Filled 2019-04-03 (×3): qty 100

## 2019-04-03 MED ORDER — GABAPENTIN 300 MG PO CAPS
300.0000 mg | ORAL_CAPSULE | Freq: Two times a day (BID) | ORAL | Status: DC
  Administered 2019-04-03 – 2019-04-06 (×6): 300 mg via ORAL
  Filled 2019-04-03 (×6): qty 1

## 2019-04-03 MED ORDER — LIDOCAINE-PRILOCAINE 2.5-2.5 % EX CREA
1.0000 "application " | TOPICAL_CREAM | CUTANEOUS | Status: DC | PRN
  Filled 2019-04-03: qty 5

## 2019-04-03 MED ORDER — SODIUM CHLORIDE 0.9 % IV SOLN: 200.0000 mg | Freq: Once | INTRAVENOUS | Status: DC

## 2019-04-03 MED ORDER — SENNOSIDES-DOCUSATE SODIUM 8.6-50 MG PO TABS: 1.0000 | ORAL_TABLET | Freq: Every evening | ORAL | Status: DC | PRN

## 2019-04-03 MED ORDER — INSULIN ASPART 100 UNIT/ML ~~LOC~~ SOLN
0.0000 [IU] | Freq: Every day | SUBCUTANEOUS | Status: DC
  Administered 2019-04-03: 2 [IU] via SUBCUTANEOUS
  Filled 2019-04-03: qty 0.05

## 2019-04-03 MED ORDER — ADULT MULTIVITAMIN W/MINERALS CH
1.0000 | ORAL_TABLET | Freq: Every day | ORAL | Status: DC
  Administered 2019-04-04 – 2019-04-06 (×3): 1 via ORAL
  Filled 2019-04-03 (×3): qty 1

## 2019-04-03 MED ORDER — MUPIROCIN 2 % EX OINT: 1.0000 "application " | TOPICAL_OINTMENT | Freq: Every day | CUTANEOUS | Status: DC | PRN
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
## 2019-04-03 NOTE — ED Triage Notes (Signed)
Patient tested postive for COVID on 03/29/19, his daughter called his doctor this morning and his doctor was concerned for low oxygen levels and advised him to be sent to the ED. Patient complains of some hot and cold episodes, denies any pain at this time.

## 2019-04-03 NOTE — Progress Notes (Signed)
Patient arrived to unit. Skin assessed. Skin is overall intact dry. Right foot ulcer on sole of foot, band aid over site. Abrasion to left great toe. Small scattered bruising and small abrasions BLE
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
## 2019-04-03 NOTE — ED Notes (Signed)
[place] (daughter, [place]) would like an update asap

## 2019-04-03 NOTE — ED Notes (Signed)
Maryan Puls, patient's daughter, is in the parking lot and would like to be updated at [phone]

## 2019-04-03 NOTE — ED Notes (Signed)
Report given to [employer], RN on 4W.

## 2019-04-03 NOTE — ED Notes (Addendum)
Report given to [facility], Therapist, sports. [facility] called for transport. Papers at bedside.
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
## 2019-04-03 NOTE — ED Notes (Signed)
Pat8inet ambulated in room, oxygen saturation ranged from 92-96% RA.

## 2019-04-04 DIAGNOSIS — D649 Anemia, unspecified: Secondary | ICD-10-CM

## 2019-04-04 DIAGNOSIS — U071 COVID-19: Principal | ICD-10-CM

## 2019-04-04 DIAGNOSIS — E1165 Type 2 diabetes mellitus with hyperglycemia: Secondary | ICD-10-CM

## 2019-04-04 DIAGNOSIS — Z8572 Personal history of non-Hodgkin lymphomas: Secondary | ICD-10-CM

## 2019-04-04 DIAGNOSIS — C61 Malignant neoplasm of prostate: Secondary | ICD-10-CM

## 2019-04-04 DIAGNOSIS — I480 Paroxysmal atrial fibrillation: Secondary | ICD-10-CM

## 2019-04-04 DIAGNOSIS — J9601 Acute respiratory failure with hypoxia: Secondary | ICD-10-CM

## 2019-04-04 DIAGNOSIS — J1289 Other viral pneumonia: Secondary | ICD-10-CM

## 2019-04-04 LAB — RETICULOCYTES
Immature Retic Fract: 11.1 % (ref 2.3–15.9)
RBC.: 3.05 MIL/uL — ABNORMAL LOW (ref 4.22–5.81)
Retic Count, Absolute: 21 10*3/uL (ref 19.0–186.0)
Retic Ct Pct: 0.7 % (ref 0.4–3.1)

## 2019-04-04 LAB — CBC WITH DIFFERENTIAL/PLATELET
Abs Immature Granulocytes: 0.09 10*3/uL — ABNORMAL HIGH (ref 0.00–0.07)
Basophils Absolute: 0 10*3/uL (ref 0.0–0.1)
Basophils Relative: 0 %
Eosinophils Absolute: 0.1 10*3/uL (ref 0.0–0.5)
Eosinophils Relative: 2 %
HCT: 21.6 % — ABNORMAL LOW (ref 39.0–52.0)
Hemoglobin: 6.7 g/dL — CL (ref 13.0–17.0)
Immature Granulocytes: 4 %
Lymphocytes Relative: 6 %
Lymphs Abs: 0.1 10*3/uL — ABNORMAL LOW (ref 0.7–4.0)
MCH: 26.4 pg (ref 26.0–34.0)
MCHC: 31 g/dL (ref 30.0–36.0)
MCV: 85 fL (ref 80.0–100.0)
Monocytes Absolute: 0.2 10*3/uL (ref 0.1–1.0)
Monocytes Relative: 8 %
Neutro Abs: 1.8 10*3/uL (ref 1.7–7.7)
Neutrophils Relative %: 80 %
Platelets: 116 10*3/uL — ABNORMAL LOW (ref 150–400)
RBC: 2.54 MIL/uL — ABNORMAL LOW (ref 4.22–5.81)
RDW: 16 % — ABNORMAL HIGH (ref 11.5–15.5)
WBC: 2.2 10*3/uL — ABNORMAL LOW (ref 4.0–10.5)
nRBC: 0 % (ref 0.0–0.2)

## 2019-04-04 LAB — GLUCOSE, CAPILLARY
Glucose-Capillary: 226 mg/dL — ABNORMAL HIGH (ref 70–99)
Glucose-Capillary: 330 mg/dL — ABNORMAL HIGH (ref 70–99)
Glucose-Capillary: 238 mg/dL — ABNORMAL HIGH (ref 70–99)

## 2019-04-04 LAB — COMPREHENSIVE METABOLIC PANEL
ALT: 18 U/L (ref 0–44)
AST: 31 U/L (ref 15–41)
Albumin: 2.8 g/dL — ABNORMAL LOW (ref 3.5–5.0)
Alkaline Phosphatase: 41 U/L (ref 38–126)
Anion gap: 8 (ref 5–15)
BUN: 22 mg/dL (ref 8–23)
CO2: 25 mmol/L (ref 22–32)
Calcium: 8.4 mg/dL — ABNORMAL LOW (ref 8.9–10.3)
Chloride: 102 mmol/L (ref 98–111)
Creatinine, Ser: 1.11 mg/dL (ref 0.61–1.24)
GFR calc Af Amer: >60 mL/min (ref >60)
GFR calc non Af Amer: >60 mL/min (ref >60)
Glucose, Bld: 128 mg/dL — ABNORMAL HIGH (ref 70–99)
Potassium: 3.8 mmol/L (ref 3.5–5.1)
Sodium: 135 mmol/L (ref 135–145)
Total Bilirubin: 0.4 mg/dL (ref 0.3–1.2)
Total Protein: 5.5 g/dL — ABNORMAL LOW (ref 6.5–8.1)

## 2019-04-04 LAB — IRON AND TIBC
Iron: 32 ug/dL — ABNORMAL LOW (ref 45–182)
Saturation Ratios: 10 % — ABNORMAL LOW (ref 17.9–39.5)
TIBC: 334 ug/dL (ref 250–450)
UIBC: 302 ug/dL

## 2019-04-04 LAB — FERRITIN: Ferritin: 49 ng/mL (ref 24–336)

## 2019-04-04 LAB — PHOSPHORUS: Phosphorus: 2.3 mg/dL — ABNORMAL LOW (ref 2.5–4.6)

## 2019-04-04 LAB — C-REACTIVE PROTEIN: CRP: 7.5 mg/dL — ABNORMAL HIGH (ref <1.0)

## 2019-04-04 LAB — MAGNESIUM: Magnesium: 1.6 mg/dL — ABNORMAL LOW (ref 1.7–2.4)

## 2019-04-04 LAB — D-DIMER, QUANTITATIVE: D-Dimer, Quant: 0.53 ug/mL-FEU — ABNORMAL HIGH (ref 0.00–0.50)

## 2019-04-04 LAB — FOLATE: Folate: 21.2 ng/mL (ref >5.9)

## 2019-04-04 LAB — PREPARE RBC (CROSSMATCH)

## 2019-04-04 MED ORDER — ALBUTEROL (5 MG/ML) CONTINUOUS INHALATION SOLN
3.0000 mL | INHALATION_SOLUTION | RESPIRATORY_TRACT | Status: DC | PRN
  Filled 2019-04-04: qty 20

## 2019-04-04 MED ORDER — DM-GUAIFENESIN ER 30-600 MG PO TB12
1.0000 | ORAL_TABLET | Freq: Two times a day (BID) | ORAL | Status: DC
  Administered 2019-04-04 – 2019-04-06 (×5): 1 via ORAL
  Filled 2019-04-04 (×5): qty 1

## 2019-04-04 MED ORDER — INSULIN ASPART 100 UNIT/ML ~~LOC~~ SOLN
0.0000 [IU] | Freq: Three times a day (TID) | SUBCUTANEOUS | Status: DC
  Administered 2019-04-04: 11 [IU] via SUBCUTANEOUS
  Administered 2019-04-05: 14:00:00 3 [IU] via SUBCUTANEOUS
  Administered 2019-04-05: 5 [IU] via SUBCUTANEOUS
  Administered 2019-04-06: 3 [IU] via SUBCUTANEOUS

## 2019-04-04 MED ORDER — INSULIN ASPART 100 UNIT/ML ~~LOC~~ SOLN
4.0000 [IU] | Freq: Three times a day (TID) | SUBCUTANEOUS | Status: DC
  Administered 2019-04-04 – 2019-04-06 (×6): 4 [IU] via SUBCUTANEOUS

## 2019-04-04 MED ORDER — ALBUTEROL SULFATE HFA 108 (90 BASE) MCG/ACT IN AERS: 2.0000 | INHALATION_SPRAY | RESPIRATORY_TRACT | Status: DC | PRN

## 2019-04-04 MED ORDER — SODIUM PHOSPHATES 45 MMOLE/15ML IV SOLN
20.0000 mmol | Freq: Once | INTRAVENOUS | Status: AC
  Administered 2019-04-04: 20 mmol via INTRAVENOUS
  Filled 2019-04-04: qty 6.67

## 2019-04-04 MED ORDER — SODIUM CHLORIDE 0.9% IV SOLUTION: Freq: Once | INTRAVENOUS | Status: DC

## 2019-04-04 MED ORDER — INSULIN ASPART 100 UNIT/ML ~~LOC~~ SOLN
0.0000 [IU] | Freq: Every day | SUBCUTANEOUS | Status: DC
  Administered 2019-04-04: 2 [IU] via SUBCUTANEOUS
  Administered 2019-04-05: 4 [IU] via SUBCUTANEOUS

## 2019-04-04 MED ORDER — MAGNESIUM SULFATE 2 GM/50ML IV SOLN
2.0000 g | Freq: Once | INTRAVENOUS | Status: AC
  Administered 2019-04-04: 13:00:00 2 g via INTRAVENOUS
  Filled 2019-04-04: qty 50

## 2019-04-04 NOTE — Progress Notes (Signed)
PROGRESS NOTE  [name] GGE:[id number] DOB: 14-Feb-1932   PCP: [name], MD  Patient is from: Home  DOA: 04/03/2019 LOS: 1  Brief Narrative / Interim history: 85 y.o. male with medical history significant of HTN, HLD, T2DM, PAF, CAD, IDA, primary hepatic NHL, retroperitoneal recurrence s/p RICE 2015, Rituxan 2015, metastatic Prostate Ca, Skin Ca and OSA who tested positive for Covid on 12/4 at PCP office, presented 12/9 with progressive fatigue, dyspnea, fever and diarrhea.  Reportedly desaturated to 86% at home when daughter checked on him. Patient reports fair amount of bleeding when he had bilateral ureteral stent replacement 2 weeks ago.  Denies active bleeding, melena or hematochezia.  In ED, HDS.  Saturating in mid 90s on RA.  No significant findings on CMP.  CRP 6.2.  Pro-Cal 0.2.  CBC remarkable for pancytopenia with WBC to 3.3, Hgb to 7.0 and platelet to 110.  POC COVID-19 positive.  CXR with multifocal pneumonia consistent with Covid infection.  Blood cultures drawn.  Admitted and started on remdesivir and Decadron.  Hgb dropped further to 6.7 overnight.  one unit of RBC ordered.  Subjective: Hgb dropped to 6.7 overnight. One unit of RBC ordered.  Reports frequent cough.  Breathing improved.  Reports poor appetite due to lack of taste.  Still with some amount of fatigue.  Denies GI or UTI symptoms.  He wishes to be DNR/DNI.  Objective: Vitals:   04/03/19 2326 04/03/19 2330 04/04/19 0532 04/04/19 1221  BP:   (!) 109/56 110/67  Pulse: 84 68 68 71  Resp:   20 17  Temp: 99.2 F (37.3 C)  99.6 F (37.6 C) (!) 100.4 F (38 C)  TempSrc: Oral  Oral Oral  SpO2: 91% 96% 93% 94%  Weight:      Height:        Intake/Output Summary (Last 24 hours) at 04/04/2019 1248 Last data filed at 04/03/2019 2000 Gross per 24 hour  Intake 490 ml  Output -  Net 490 ml   Filed Weights   04/03/19 1241  Weight: 73.8 kg    Examination:  GENERAL: No acute distress.  Appears  well.  HEENT: MMM.  Vision and hearing grossly intact.  NECK: Supple.  No apparent JVD.  RESP:  No IWOB.  Rhonchi bilaterally CVS:  RRR. Heart sounds normal.  ABD/GI/GU: Bowel sounds present. Soft. Non tender.  MSK/EXT:  No apparent deformity or edema. Moves extremities. SKIN: no apparent skin lesion or wound NEURO: Awake, alert and oriented appropriately.  No gross deficit.  PSYCH: Calm. Normal affect.   Procedures:  None  Assessment & Plan: Acute respiratory failure with hypoxia due to COVID-19 infection/pneumonia.  Anemia could contribute. Desaturated to 86% at home prior to arrival.  Now on 2 L.  Had elevated CRP and CXR consistent with Covid pneumonia. Recent Labs    04/03/19 1303 04/04/19 0426  DDIMER 0.58* 0.53*  FERRITIN 44 49  LDH 131  --   CRP 6.2* 7.5*  -Continue remdesivir and Decadron 12/9>> -Inhalers, mucolytic's, antitussive, incentive spirometry and OOB. -Trend inflammatory markers.  Symptomatic anemia/fatigue: denies bleeding other than recent bleed at urology office during the stent exchange 2 weeks ago. -Hgb 9-10 (baseline)> 7.0 (admit)> 6.7>1u -Check anemia panel -Continue trending.  Uncontrolled DM-2 with hyperglycemia Recent Labs    04/03/19 2302 04/04/19 1227  GLUCAP 214* 226*  -Check hemoglobin A1c -Continue Lantus 10 units nightly -Increase SSI to moderate with night coverage -Added NovoLog 3 units AC -Continue statin and gabapentin. -  Continue monitoring  Pancytopenia: Leukopenia worse.  Thrombocytopenia improving.  Anemia as above. -Continue monitoring  Paroxysmal A. Fib: In NSR.  Not on rate or rhythm control medications. -Continue Xarelto.  Chronic CAD: Stable.  No anginal symptoms. -Continue statin and Xarelto.  History of depression: Stable. -Continue home Zoloft  History of primary hepatic NHL with retroperitoneal recurrence s/p RICE 2015, Rituxan 2015. -Outpatient follow-up.  Metastatic prostate cancer on Lupron -Outpatient  follow-up.  Pancreatic insufficiency -Continue home Creon.  History of glaucoma: Stable -Continue home eyedrops.  Hypomagnesemia and hypophosphatemia -Replenish and recheck.            DVT prophylaxis: On Xarelto for A. fib. Code Status: DNR/DNI-patient affirmed  Family Communication: Patient and/or RN. Disposition Plan: Remains inpatient Consultants: None   Microbiology summarized: 12/9-POC COVID-19 test positive. 12/9-blood cultures negative so far.  Sch Meds:  Scheduled Meds: . sodium chloride   Intravenous Once  . allopurinol  300 mg Oral QPC breakfast  . atorvastatin  20 mg Oral QPM  . Chlorhexidine Gluconate Cloth  6 each Topical Daily  . dexamethasone  6 mg Oral Q24H  . dextromethorphan-guaiFENesin  1 tablet Oral BID  . fenofibrate  160 mg Oral QPM  . gabapentin  300 mg Oral BID  . insulin aspart  0-5 Units Subcutaneous QHS  . insulin aspart  0-9 Units Subcutaneous TID WC  . insulin glargine  10 Units Subcutaneous QPC breakfast  . latanoprost  1 drop Both Eyes QHS  . lipase/protease/amylase  12,000 Units Oral TID WC  . loratadine  10 mg Oral Daily  . magnesium oxide  400 mg Oral BID  . multivitamin with minerals  1 tablet Oral Q breakfast  . Rivaroxaban  15 mg Oral Q lunch  . sertraline  100 mg Oral QPC breakfast  . vitamin B-12  2,500 mcg Oral q morning - 10a  . vitamin C  500 mg Oral QPC breakfast  . zinc sulfate  220 mg Oral Daily   Continuous Infusions: . magnesium sulfate bolus IVPB 2 g (04/04/19 1232)  . remdesivir 100 mg in NS 100 mL 100 mg (04/04/19 1059)  . sodium phosphate  Dextrose 5% IVPB     PRN Meds:.acetaminophen, albuterol, lidocaine-prilocaine, mupirocin ointment, ondansetron **OR** ondansetron (ZOFRAN) IV, senna-docusate, sodium chloride flush  Antimicrobials: Anti-infectives (From admission, onward)   Start     Dose/Rate Route Frequency Ordered Stop   04/04/19 1000  remdesivir 100 mg in sodium chloride 0.9 % 100 mL IVPB  Status:   Discontinued     100 mg 200 mL/hr over 30 Minutes Intravenous Daily 04/03/19 1738 04/03/19 1917   04/04/19 1000  remdesivir 100 mg in sodium chloride 0.9 % 100 mL IVPB     100 mg 200 mL/hr over 30 Minutes Intravenous Daily 04/03/19 1613 04/08/19 0959   04/03/19 1738  remdesivir 200 mg in sodium chloride 0.9% 250 mL IVPB  Status:  Discontinued     200 mg 580 mL/hr over 30 Minutes Intravenous Once 04/03/19 1738 04/03/19 1917   04/03/19 1630  remdesivir 200 mg in sodium chloride 0.9% 250 mL IVPB     200 mg 580 mL/hr over 30 Minutes Intravenous Once 04/03/19 1613 04/03/19 1737       I have personally reviewed the following labs and images: CBC: Recent Labs  Lab 04/03/19 1303 04/04/19 0426  WBC 3.3* 2.2*  NEUTROABS 2.7 1.8  HGB 7.0* 6.7*  HCT 23.0* 21.6*  MCV 86.1 85.0  PLT 110* 116*  BMP &GFR Recent Labs  Lab 04/03/19 1303 04/04/19 0426  NA 136 135  K 3.8 3.8  CL 104 102  CO2 24 25  GLUCOSE 140* 128*  BUN 26* 22  CREATININE 1.22 1.11  CALCIUM 8.6* 8.4*  MG  --  1.6*  PHOS  --  2.3*   Estimated Creatinine Clearance: 47.1 mL/min (by C-G formula based on SCr of 1.11 mg/dL). Liver & Pancreas: Recent Labs  Lab 04/03/19 1303 04/04/19 0426  AST 33 31  ALT 19 18  ALKPHOS 43 41  BILITOT 0.4 0.4  PROT 5.8* 5.5*  ALBUMIN 2.9* 2.8*   No results for input(s): LIPASE, AMYLASE in the last 168 hours. No results for input(s): AMMONIA in the last 168 hours. Diabetic: No results for input(s): HGBA1C in the last 72 hours. Recent Labs  Lab 04/03/19 2302 04/04/19 1227  GLUCAP 214* 226*   Cardiac Enzymes: No results for input(s): CKTOTAL, CKMB, CKMBINDEX, TROPONINI in the last 168 hours. No results for input(s): PROBNP in the last 8760 hours. Coagulation Profile: No results for input(s): INR, PROTIME in the last 168 hours. Thyroid Function Tests: No results for input(s): TSH, T4TOTAL, FREET4, T3FREE, THYROIDAB in the last 72 hours. Lipid Profile: Recent Labs     04/03/19 1303  TRIG 196*   Anemia Panel: Recent Labs    04/03/19 1303 04/04/19 0426  FERRITIN 44 49   Urine analysis:    Component Value Date/Time   COLORURINE YELLOW 07/04/2018 1000   APPEARANCEUR TURBID (A) 07/04/2018 1000   LABSPEC 1.008 07/04/2018 1000   LABSPEC 1.020 04/24/2015 0846   PHURINE 6.0 07/04/2018 1000   GLUCOSEU >=500 (A) 07/04/2018 1000   GLUCOSEU 250 04/24/2015 0846   HGBUR LARGE (A) 07/04/2018 1000   BILIRUBINUR NEGATIVE 07/04/2018 1000   BILIRUBINUR Negative 04/24/2015 0846   KETONESUR NEGATIVE 07/04/2018 1000   PROTEINUR 30 (A) 07/04/2018 1000   UROBILINOGEN 0.2 04/24/2015 0846   NITRITE NEGATIVE 07/04/2018 1000   LEUKOCYTESUR LARGE (A) 07/04/2018 1000   LEUKOCYTESUR Negative 04/24/2015 0846   Sepsis Labs: Invalid input(s): PROCALCITONIN, [place]  Microbiology: Recent Results (from the past 240 hour(s))  Blood Culture (routine x 2)     Status: None (Preliminary result)   Collection Time: 04/03/19  1:03 PM   Specimen: BLOOD  Result Value [place] Status   Specimen Description   Final    BLOOD PORTA CATH Performed at [facility] [address] [place], [place] [address]    Special Requests   Final    BOTTLES DRAWN AEROBIC AND ANAEROBIC Blood Culture adequate volume Performed at [facility] [address], [place], [place] [address]    Culture   Final    NO GROWTH < 24 HOURS Performed at [facility] [address], [place], [place] [address]    Report Status PENDING  Incomplete  Blood Culture (routine x 2)     Status: None (Preliminary result)   Collection Time: 04/03/19  1:03 PM   Specimen: BLOOD  Result Value [place] Status   Specimen Description   Final    BLOOD LEFT WRIST Performed at [facility] [address] [place], [place] [address]    Special Requests   Final    BOTTLES DRAWN AEROBIC AND ANAEROBIC Blood Culture adequate volume Performed at [facility] [address], [place], [place] [address]    Culture   Final    NO GROWTH < 24 HOURS  Performed at [facility], [place] [address] [place], [place] [address]    Report Status PENDING  Incomplete    Radiology Studies: DG Chest Port 1 View  Result Date: 04/03/2019 CLINICAL DATA:  [name], COVID-19 positive 03/29/2019 EXAM: PORTABLE CHEST 1 VIEW COMPARISON:  Coronary CT, chest radiographs 07/03/2018, 10/08/2017 FINDINGS: Multifocal areas of airspace opacity most pronounced in the right infrahilar lung and left lung periphery. Suspect some left pleural thickening may reflect small effusion or scarring. No right effusion. No pneumothorax. Right subclavian approach Port-A-Cath tip terminates in the right atrium. Currently accessed at this time via [facility] needle. The aorta is calcified. The remaining cardiomediastinal contours are unremarkable. No acute osseous or soft tissue abnormality. Degenerative changes are present in the imaged spine and shoulders. IMPRESSION: 1. Multifocal areas of airspace opacity compatible with a multifocal pneumonia including viral etiologies such as COVID-19. 2. Suspect small left pleural effusion or scarring. 3.  Aortic Atherosclerosis (ICD10-I70.0). Electronically Signed   By: Lovena Le M.D.   On: 04/03/2019 14:03    45 minutes with more than 50% spent in reviewing records, counseling patient/family and coordinating care.    T. Shubert  If 7PM-7AM, please contact night-coverage [email or web address] Password [email or web address] 04/04/2019, 12:48 PM

## 2019-04-04 NOTE — Progress Notes (Signed)
CRITICAL VALUE ALERT  Critical Value:  hgb 6.7  Date & Time Notied:  0512 04/04/19  Provider Notified: Lennox Grumbles  Orders Received/Actions taken: new orders placed

## 2019-04-04 NOTE — Plan of Care (Signed)
  Problem: Education: Goal: Knowledge of General Education information will improve Description Including pain rating scale, medication(s)/side effects and non-pharmacologic comfort measures Outcome: Progressing   

## 2019-04-04 NOTE — Progress Notes (Signed)
Patient comfortable in bed, A&Ox4, no resp distress noted, on 2 liters [place], occasional cough, lungs diminished but clear, had BM today, voiding in urinal, right chest port dressing intact, IV fluids infusing currently, no edema, skin intact, bed locked and low with alarm on, anxious to get up with PT tomorrow, feels much better after unit of PRBC's today, labs to be rechecked in the am, call bell in reach, will continue to monitor throughout rest of shift.

## 2019-04-04 NOTE — Plan of Care (Signed)
  Problem: Health Behavior/Discharge Planning: Goal: Ability to manage health-related needs will improve Outcome: Progressing   Problem: Clinical Measurements: Goal: Ability to maintain clinical measurements within normal limits will improve Outcome: Progressing Goal: Will remain free from infection Outcome: Progressing Goal: Diagnostic test results will improve Outcome: Progressing Goal: Respiratory complications will improve Outcome: Progressing Goal: Cardiovascular complication will be avoided Outcome: Progressing   Problem: Education: Goal: Knowledge of General Education information will improve Description: Including pain rating scale, medication(s)/side effects and non-pharmacologic comfort measures Outcome: Progressing   Problem: Activity: Goal: Risk for activity intolerance will decrease Outcome: Progressing   Problem: Nutrition: Goal: Adequate nutrition will be maintained Outcome: Progressing   Problem: Coping: Goal: Level of anxiety will decrease Outcome: Progressing   Problem: Elimination: Goal: Will not experience complications related to bowel motility Outcome: Progressing Goal: Will not experience complications related to urinary retention Outcome: Progressing   Problem: Pain Managment: Goal: General experience of comfort will improve Outcome: Progressing   Problem: Safety: Goal: Ability to remain free from injury will improve Outcome: Progressing   Problem: Skin Integrity: Goal: Risk for impaired skin integrity will decrease Outcome: Progressing   Problem: Education: Goal: Knowledge of risk factors and measures for prevention of condition will improve Outcome: Progressing   Problem: Coping: Goal: Psychosocial and spiritual needs will be supported Outcome: Progressing   Problem: Respiratory: Goal: Will maintain a patent airway Outcome: Progressing Goal: Complications related to the disease process, condition or treatment will be avoided or  minimized Outcome: Progressing

## 2019-04-04 NOTE — Plan of Care (Signed)
  Problem: Education: Goal: Knowledge of General Education information will improve Description Including pain rating scale, medication(s)/side effects and non-pharmacologic comfort measures Outcome: Progressing   Problem: Health Behavior/Discharge Planning: Goal: Ability to manage health-related needs will improve Outcome: Progressing   

## 2019-04-04 NOTE — Progress Notes (Signed)
Spoke with daughter [name] and gave her a brief update, MD made aware. SRP, RN

## 2019-04-05 ENCOUNTER — Ambulatory Visit: Payer: Medicare Other

## 2019-04-05 ENCOUNTER — Ambulatory Visit: Payer: Medicare Other | Admitting: Oncology

## 2019-04-05 ENCOUNTER — Other Ambulatory Visit: Payer: Medicare Other

## 2019-04-05 LAB — FERRITIN: Ferritin: 57 ng/mL (ref 24–336)

## 2019-04-05 LAB — COMPREHENSIVE METABOLIC PANEL
ALT: 18 U/L (ref 0–44)
AST: 29 U/L (ref 15–41)
Albumin: 2.9 g/dL — ABNORMAL LOW (ref 3.5–5.0)
Alkaline Phosphatase: 45 U/L (ref 38–126)
Anion gap: 13 (ref 5–15)
BUN: 23 mg/dL (ref 8–23)
CO2: 25 mmol/L (ref 22–32)
Calcium: 8.4 mg/dL — ABNORMAL LOW (ref 8.9–10.3)
Chloride: 100 mmol/L (ref 98–111)
Creatinine, Ser: 1.07 mg/dL (ref 0.61–1.24)
GFR calc Af Amer: >60 mL/min (ref >60)
GFR calc non Af Amer: >60 mL/min (ref >60)
Glucose, Bld: 138 mg/dL — ABNORMAL HIGH (ref 70–99)
Potassium: 3.6 mmol/L (ref 3.5–5.1)
Sodium: 138 mmol/L (ref 135–145)
Total Bilirubin: 0.2 mg/dL — ABNORMAL LOW (ref 0.3–1.2)
Total Protein: 5.9 g/dL — ABNORMAL LOW (ref 6.5–8.1)

## 2019-04-05 LAB — CBC WITH DIFFERENTIAL/PLATELET
Abs Immature Granulocytes: 0.12 10*3/uL — ABNORMAL HIGH (ref 0.00–0.07)
Basophils Absolute: 0 10*3/uL (ref 0.0–0.1)
Basophils Relative: 0 %
Eosinophils Absolute: 0 10*3/uL (ref 0.0–0.5)
Eosinophils Relative: 0 %
HCT: 27.6 % — ABNORMAL LOW (ref 39.0–52.0)
Hemoglobin: 8.7 g/dL — ABNORMAL LOW (ref 13.0–17.0)
Immature Granulocytes: 4 %
Lymphocytes Relative: 6 %
Lymphs Abs: 0.2 10*3/uL — ABNORMAL LOW (ref 0.7–4.0)
MCH: 26.9 pg (ref 26.0–34.0)
MCHC: 31.5 g/dL (ref 30.0–36.0)
MCV: 85.2 fL (ref 80.0–100.0)
Monocytes Absolute: 0.2 10*3/uL (ref 0.1–1.0)
Monocytes Relative: 8 %
Neutro Abs: 2.5 10*3/uL (ref 1.7–7.7)
Neutrophils Relative %: 82 %
Platelets: 132 10*3/uL — ABNORMAL LOW (ref 150–400)
RBC: 3.24 MIL/uL — ABNORMAL LOW (ref 4.22–5.81)
RDW: 15.7 % — ABNORMAL HIGH (ref 11.5–15.5)
WBC: 3 10*3/uL — ABNORMAL LOW (ref 4.0–10.5)
nRBC: 0 % (ref 0.0–0.2)

## 2019-04-05 LAB — C-REACTIVE PROTEIN: CRP: 7.3 mg/dL — ABNORMAL HIGH (ref <1.0)

## 2019-04-05 LAB — MAGNESIUM: Magnesium: 2.1 mg/dL (ref 1.7–2.4)

## 2019-04-05 LAB — HEMOGLOBIN A1C
Hgb A1c MFr Bld: 7.4 % — ABNORMAL HIGH (ref 4.8–5.6)
Mean Plasma Glucose: 165.68 mg/dL

## 2019-04-05 LAB — BPAM RBC
Blood Product Expiration Date: 202012302359
ISSUE DATE / TIME: 202012101409
Unit Type and Rh: 6200

## 2019-04-05 LAB — TYPE AND SCREEN
ABO/RH(D): A POS
Antibody Screen: NEGATIVE
Unit division: 0

## 2019-04-05 LAB — VITAMIN B12: Vitamin B-12: 2628 pg/mL — ABNORMAL HIGH (ref 180–914)

## 2019-04-05 LAB — GLUCOSE, CAPILLARY
Glucose-Capillary: 114 mg/dL — ABNORMAL HIGH (ref 70–99)
Glucose-Capillary: 186 mg/dL — ABNORMAL HIGH (ref 70–99)
Glucose-Capillary: 245 mg/dL — ABNORMAL HIGH (ref 70–99)
Glucose-Capillary: 328 mg/dL — ABNORMAL HIGH (ref 70–99)

## 2019-04-05 LAB — PHOSPHORUS: Phosphorus: 3.2 mg/dL (ref 2.5–4.6)

## 2019-04-05 LAB — D-DIMER, QUANTITATIVE: D-Dimer, Quant: 0.59 ug/mL-FEU — ABNORMAL HIGH (ref 0.00–0.50)

## 2019-04-05 MED ORDER — MUPIROCIN CALCIUM 2 % EX CREA
TOPICAL_CREAM | Freq: Every day | CUTANEOUS | Status: DC
  Administered 2019-04-06: 13:00:00 via TOPICAL
  Filled 2019-04-05: qty 15

## 2019-04-05 MED ORDER — FERROUS SULFATE 325 (65 FE) MG PO TABS
325.0000 mg | ORAL_TABLET | Freq: Two times a day (BID) | ORAL | Status: DC
  Administered 2019-04-05 – 2019-04-06 (×2): 325 mg via ORAL
  Filled 2019-04-05 (×2): qty 1

## 2019-04-05 MED ORDER — MUPIROCIN 2 % EX OINT: TOPICAL_OINTMENT | Freq: Two times a day (BID) | CUTANEOUS | Status: DC

## 2019-04-05 MED ORDER — POLYETHYLENE GLYCOL 3350 17 G PO PACK: 17.0000 g | PACK | Freq: Every day | ORAL | Status: DC | PRN

## 2019-04-05 MED ORDER — SODIUM CHLORIDE 0.9 % IV SOLN
510.0000 mg | Freq: Once | INTRAVENOUS | Status: AC
  Administered 2019-04-05: 510 mg via INTRAVENOUS
  Filled 2019-04-05: qty 17

## 2019-04-05 NOTE — TOC Progression Note (Signed)
Transition of Care [facility]) - Progression Note    Patient Details  Name: [name] MRN: [id number] Date of Birth: 02-21-1932  Transition of [facility]) CM/SW Contact  Purcell Mouton, RN Phone Number: 04/05/2019, 4:50 PM  Clinical Narrative:     Pt COVID positive from home, will continue to follow for discharge needs.        Expected Discharge Plan and Services                                                 Social Determinants of Health (SDOH) Interventions    Readmission Risk Interventions Readmission Risk Prevention Plan 11/06/2018  Transportation Screening Complete  PCP or Specialist Appt within 3-5 Days Not Complete  Not Complete comments Just admitted  [place] or [place] Not Complete  HRI or Home Care Consult comments Unknown what needs are at this time  Social Work Consult for [place] Planning/Counseling Not Complete  SW consult not completed comments NA  Palliative Care Screening Not Applicable  Medication Review Press photographer) Complete  Some recent data might be hidden

## 2019-04-05 NOTE — Evaluation (Signed)
Occupational Therapy Evaluation Patient Details Name: [name] MRN: [id number] DOB: 06-04-1931 Today's Date: 04/05/2019    History of Present Illness 85 year old man admitted for fatique, SOB, fevers; dx'd with COVID.  Recently had stents placed by Dr [name]  PMH:  DM, HTN, NHL, prostate CA and A Fib   Clinical Impression   Pt was admitted for the above. At baseline, he is independent. He reports that his daughter and her family moved in with him after his wife died.  He currently needs min guard for adls. Will follow in acute setting with supervision level goals.      Follow Up Recommendations  Supervision/Assistance - 24 hour    Equipment Recommendations  None recommended by OT    Recommendations for Other Services       Precautions / Restrictions Precautions Precautions: Fall Restrictions Weight Bearing Restrictions: No      Mobility Bed Mobility               General bed mobility comments: oob  Transfers Overall transfer level: Needs assistance Equipment used: Rolling walker (2 wheeled) Transfers: Sit to/from Stand Sit to Stand: Supervision;Min guard         General transfer comment: for safety.  Pt states he doesn't usually use a RW    Balance Overall balance assessment: Mild deficits observed, not formally tested                                         ADL either performed or assessed with clinical judgement   ADL Overall ADL's : Needs assistance/impaired     Grooming: Wash/dry hands;Supervision/safety;Standing                   Toilet Transfer: Min guard;Ambulation(to urinate)             General ADL Comments: min guard to gather adl supplies and complete adl when standing     Vision         Perception     Praxis      Pertinent Vitals/Pain Pain Assessment: No/denies pain     Hand Dominance     Extremity/Trunk Assessment Upper Extremity Assessment Upper Extremity Assessment: Overall WFL  for tasks assessed           Communication Communication Communication: No difficulties   Cognition Arousal/Alertness: Awake/alert Behavior During Therapy: WFL for tasks assessed/performed Overall Cognitive Status: Within Functional Limits for tasks assessed                                 General Comments: per chart, memory difficulties   General Comments  VSS; on 2 liters 02    Exercises     Shoulder Instructions      Home Living Family/patient expects to be discharged to:: Private residence Living Arrangements: Children;Other relatives Available Help at Discharge: Family;Available 24 hours/day Type of Home: House             Bathroom Shower/Tub: Walk-in Psychologist, prison and probation services: Standard     Home Equipment: Environmental consultant - 4 wheels;Walker - 2 wheels;Shower seat;Cane - single point;Bedside commode   Additional Comments: pt reports he has extended family living with him since his wife died      Prior Functioning/Environment Level of Independence: Independent        Comments:  Patient likes to garden.        OT Problem List: Decreased strength;Decreased activity tolerance;Impaired balance (sitting and/or standing)      OT Treatment/Interventions: Self-care/ADL training;DME and/or AE instruction;Patient/family education;Therapeutic activities;Balance training    OT Goals(Current goals can be found in the care plan section) Acute Rehab OT Goals Patient Stated Goal: get back to being active and gardening. Has an 29 month old in the household OT Goal Formulation: With patient Time For Goal Achievement: 04/19/19 Potential to Achieve Goals: Good ADL Goals Pt Will Transfer to Toilet: with supervision;ambulating;bedside commode Additional ADL Goal #1: pt will gather adl supplies with supervision and complete adl on his own (no supervision) Additional ADL Goal #2: pt will initiate at least one rest break as needed without cues for energy conservation   OT Frequency: Min 2X/week   Barriers to D/C:            Co-evaluation              AM-PAC OT "6 Clicks" Daily Activity     Outcome Measure Help from another person eating meals?: None Help from another person taking care of personal grooming?: A Little Help from another person toileting, which includes using toliet, bedpan, or urinal?: A Little Help from another person bathing (including washing, rinsing, drying)?: A Little Help from another person to put on and taking off regular upper body clothing?: A Little Help from another person to put on and taking off regular lower body clothing?: A Little 6 Click Score: 19   End of Session    Activity Tolerance: Patient tolerated treatment well Patient left: in chair;with call bell/phone within reach  OT Visit Diagnosis: Muscle weakness (generalized) (M62.81)                Time: 8563-1497 OT Time Calculation (min): 21 min Charges:  OT General Charges $OT Visit: 1 Visit OT Evaluation $OT Eval Low Complexity: 1 Low  Sabino Denning S, OTR/L Acute Rehabilitation Services 04/05/2019  Kassi Esteve 04/05/2019, 3:00 PM

## 2019-04-05 NOTE — Plan of Care (Signed)
  Problem: Health Behavior/Discharge Planning: Goal: Ability to manage health-related needs will improve Outcome: Progressing   Problem: Clinical Measurements: Goal: Ability to maintain clinical measurements within normal limits will improve Outcome: Progressing Goal: Will remain free from infection Outcome: Progressing Goal: Diagnostic test results will improve Outcome: Progressing Goal: Respiratory complications will improve Outcome: Progressing Goal: Cardiovascular complication will be avoided Outcome: Progressing   Problem: Health Behavior/Discharge Planning: Goal: Ability to manage health-related needs will improve Outcome: Progressing   Problem: Clinical Measurements: Goal: Ability to maintain clinical measurements within normal limits will improve Outcome: Progressing Goal: Will remain free from infection Outcome: Progressing Goal: Diagnostic test results will improve Outcome: Progressing Goal: Respiratory complications will improve Outcome: Progressing Goal: Cardiovascular complication will be avoided Outcome: Progressing

## 2019-04-05 NOTE — Evaluation (Signed)
Physical Therapy Evaluation Patient Details Name: [name] MRN: [id number] DOB: Jun 14, 1931 Today's Date: 04/05/2019   History of Present Illness  85 year old man admitted for fatique, SOB, fevers; dx'd with COVID.  Recently had stents placed by Dr [name]  PMH:  DM, HTN, NHL, prostate CA and A Fib  Clinical Impression  Pt admitted with above diagnosis.  Pt very motivated and cooperative, amb in room ~40' and performed LE ex.  Pt tolerated well; SpO2 did not drop below 90% while on RA during PT session. Do not feel pt will f/u but we will follow in acute setting.   Pt currently with functional limitations due to the deficits listed below (see PT Problem List). Pt will benefit from skilled PT to increase their independence and safety with mobility to allow discharge to the venue listed below.       Follow Up Recommendations No PT follow up;Supervision - Intermittent    Equipment Recommendations  None recommended by PT    Recommendations for Other Services       Precautions / Restrictions Precautions Precautions: Fall Restrictions Weight Bearing Restrictions: No      Mobility  Bed Mobility               General bed mobility comments: oob in recliner  Transfers Overall transfer level: Needs assistance Equipment used: None Transfers: Sit to/from Stand Sit to Stand: Supervision;Min guard         General transfer comment: for safety  Ambulation/Gait Ambulation/Gait assistance: Min guard;Supervision Gait Distance (Feet): 40 Feet(in room) Assistive device: IV Pole;None Gait Pattern/deviations: Step-through pattern;Decreased stride length     General Gait Details: cues for posture and breathing  Stairs            Wheelchair Mobility    Modified Rankin (Stroke Patients Only)       Balance Overall balance assessment: Mild deficits observed, not formally tested(no LOB with in room amb, mildy unsteady initially, no overt LOB)                                            Pertinent Vitals/Pain Pain Assessment: No/denies pain    Home Living Family/patient expects to be discharged to:: Private residence Living Arrangements: Children;Other relatives Available Help at Discharge: Family;Available 24 hours/day Type of Home: House Home Access: Ramped entrance     Home Layout: One level Home Equipment: Walker - 4 wheels;Walker - 2 wheels;Shower seat;Cane - single point;Bedside commode Additional Comments: pt reports he has extended family living with him since his wife died    Prior Function Level of Independence: Independent         Comments: Patient likes to garden.     Hand Dominance        Extremity/Trunk Assessment   Upper Extremity Assessment Upper Extremity Assessment: Defer to OT evaluation;Overall [facility] for tasks assessed    Lower Extremity Assessment Lower Extremity Assessment: Overall WFL for tasks assessed       Communication   Communication: No difficulties  Cognition Arousal/Alertness: Awake/alert Behavior During Therapy: WFL for tasks assessed/performed Overall Cognitive Status: Within Functional Limits for tasks assessed                                 General Comments: per chart, memory difficulties  General Comments General comments (skin integrity, edema, etc.): SpO2=91% or greater with in room activity/amb while on RA (O2 replaced at 1.5L)    Exercises General Exercises - Lower Extremity Ankle Circles/Pumps: AROM;Both;10 reps Long Arc Quad: AROM;Both;10 reps;Seated Hip Flexion/Marching: AROM;10 reps;Both;Seated   Assessment/Plan    PT Assessment Patient needs continued PT services  PT Problem List Decreased mobility;Decreased activity tolerance;Cardiopulmonary status limiting activity       PT Treatment Interventions DME instruction;Gait training;Functional mobility training;Therapeutic activities;Patient/family education;Therapeutic exercise     PT Goals (Current goals can be found in the Care Plan section)  Acute Rehab PT Goals Patient Stated Goal: get back to being active and gardening. Has an 54 month old in the household PT Goal Formulation: With patient Time For Goal Achievement: 04/19/19 Potential to Achieve Goals: Good    Frequency Min 2X/week   Barriers to discharge        Co-evaluation               AM-PAC PT "6 Clicks" Mobility  Outcome Measure Help needed turning from your back to your side while in a flat bed without using bedrails?: None Help needed moving from lying on your back to sitting on the side of a flat bed without using bedrails?: None Help needed moving to and from a bed to a chair (including a wheelchair)?: A Little Help needed standing up from a chair using your arms (e.g., wheelchair or bedside chair)?: A Little Help needed to walk in hospital room?: A Little Help needed climbing 3-5 steps with a railing? : A Little 6 Click Score: 20    End of Session   Activity Tolerance: Patient tolerated treatment well Patient left: in chair;with call bell/phone within reach   PT Visit Diagnosis: Difficulty in walking, not elsewhere classified (R26.2)    Time: 1610-9604 PT Time Calculation (min) (ACUTE ONLY): 24 min   Charges:   PT Evaluation $PT Eval Low Complexity: 1 Low          Assyria Morreale, PT   Acute Rehab Dept [facility]): [phone]   04/05/2019   [facility] 04/05/2019, 4:35 PM

## 2019-04-05 NOTE — Progress Notes (Signed)
PROGRESS NOTE  [name] AXK:[id number] DOB: December 31, 1931   PCP: [name], MD  Patient is from: Home  DOA: 04/03/2019 LOS: 2  Brief Narrative / Interim history: 85 y.o. male with medical history significant of HTN, HLD, T2DM, PAF, CAD, IDA, primary hepatic NHL, retroperitoneal recurrence s/p RICE 2015, Rituxan 2015, metastatic Prostate Ca, Skin Ca and OSA who tested positive for Covid on 12/4 at PCP office, presented 12/9 with progressive fatigue, dyspnea, fever and diarrhea.  Reportedly desaturated to 86% at home when daughter checked on him. Patient reports fair amount of bleeding when he had bilateral ureteral stent replacement 2 weeks ago.  Denies active bleeding, melena or hematochezia.  In ED, HDS.  Saturating in mid 90s on RA.  No significant findings on CMP.  CRP 6.2.  Pro-Cal 0.2.  CBC remarkable for pancytopenia with WBC to 3.3, Hgb to 7.0 and platelet to 110.  POC COVID-19 positive.  CXR with multifocal pneumonia consistent with Covid infection.  Blood cultures drawn.  Admitted and started on remdesivir and Decadron.  Hgb dropped further to 6.7 overnight.  one unit of RBC ordered.  Subjective: No major events overnight of this morning.  No complaints.  Breathing and cough improved.  Feels stronger after blood transfusion.  Denies chest pain, GI or UTI symptoms.  Still on supplemental oxygen to maintain appropriate saturation.  Objective: Vitals:   04/05/19 0551 04/05/19 1039 04/05/19 1045 04/05/19 1047  BP: (!) 149/70     Pulse: 66 76 80 76  Resp: 16 20 (!) 22 20  Temp: 98 F (36.7 C)     TempSrc: Oral     SpO2: 93% 92% (!) 87% 92%  Weight:      Height:        Intake/Output Summary (Last 24 hours) at 04/05/2019 1205 Last data filed at 04/05/2019 0600 Gross per 24 hour  Intake 1415.57 ml  Output 2150 ml  Net -734.43 ml   Filed Weights   04/03/19 1241  Weight: 73.8 kg    Examination:  GENERAL: No acute distress.  Appears well.  HEENT: MMM.  Vision  and hearing grossly intact.  NECK: Supple.  No apparent JVD.  RESP:  No IWOB.  Fair aeration with rhonchi bilaterally. CVS:  RRR. Heart sounds normal.  ABD/GI/GU: Bowel sounds present. Soft. Non tender.  MSK/EXT:  Moves extremities. No apparent deformity or edema.  SKIN: no apparent skin lesion or wound NEURO: Awake, alert and oriented appropriately.  No apparent focal neuro deficit. PSYCH: Calm. Normal affect.  Procedures:  None  Assessment & Plan: Acute respiratory failure with hypoxia due to COVID-19 infection/pneumonia.  Anemia could contribute. Desaturated to 86% at home prior to arrival.  Had elevated CRP and CXR consistent with Covid pneumonia. -Still on 2 L to maintain saturation in low 90s. -Continue remdesivir and Decadron 12/9>> -Inhalers, mucolytic's, antitussive, incentive spirometry and OOB. -Trend inflammatory markers. -Wean oxygen as able and ambulate daily Recent Labs    04/03/19 1303 04/04/19 0426 04/05/19 0500  DDIMER 0.58* 0.53* 0.59*  FERRITIN 44 49 57  LDH 131  --   --   CRP 6.2* 7.5* 7.3*   Symptomatic anemia/fatigue: denies bleeding other than recent bleed at urology office during the stent exchange 2 weeks ago.  Anemia panel consistent with iron deficiency. -Hgb 9-10 (baseline)> 7.0 (admit)> 6.7>1u> 8.7 -Infuse IV Feraheme -Ferrous sulfate -Monitor H&H  Uncontrolled DM-2 with hyperglycemia: A1c 7.4%. Recent Labs    04/04/19 1708 04/04/19 2259 04/05/19 0750  GLUCAP  330* 238* 114*  -Continue Lantus 10 units nightly -Continue SSI-moderate with night coverage -NovoLog 3 units AC -Continue statin and gabapentin. -Continue monitoring  Pancytopenia: Improving. -Continue monitoring  Paroxysmal A. Fib: In NSR.  Not on rate or rhythm control medications. -Continue Xarelto.  Chronic CAD: Stable.  No anginal symptoms. -Continue statin and Xarelto.  History of depression: Stable. -Continue home Zoloft  History of primary hepatic NHL with  retroperitoneal recurrence s/p RICE 2015, Rituxan 2015. -Outpatient follow-up.  Metastatic prostate cancer on Lupron -Outpatient follow-up.  Pancreatic insufficiency -Continue home Creon.  History of glaucoma: Stable -Continue home eyedrops.  Hypomagnesemia and hypophosphatemia: Resolved            DVT prophylaxis: On Xarelto for A. fib. Code Status: DNR/DNI-patient affirmed  Family Communication: Updated patient's daughter over the phone on 12/10. Disposition Plan: Remains inpatient due to supplemental oxygen requirements Consultants: None   Microbiology summarized: 12/9-POC COVID-19 test positive. 12/9-blood cultures negative so far.  Sch Meds:  Scheduled Meds: . sodium chloride   Intravenous Once  . allopurinol  300 mg Oral QPC breakfast  . atorvastatin  20 mg Oral QPM  . Chlorhexidine Gluconate Cloth  6 each Topical Daily  . dexamethasone  6 mg Oral Q24H  . dextromethorphan-guaiFENesin  1 tablet Oral BID  . gabapentin  300 mg Oral BID  . insulin aspart  0-15 Units Subcutaneous TID WC  . insulin aspart  0-5 Units Subcutaneous QHS  . insulin aspart  4 Units Subcutaneous TID WC  . insulin glargine  10 Units Subcutaneous QPC breakfast  . latanoprost  1 drop Both Eyes QHS  . lipase/protease/amylase  12,000 Units Oral TID WC  . loratadine  10 mg Oral Daily  . magnesium oxide  400 mg Oral BID  . multivitamin with minerals  1 tablet Oral Q breakfast  . Rivaroxaban  15 mg Oral Q lunch  . sertraline  100 mg Oral QPC breakfast  . vitamin B-12  2,500 mcg Oral q morning - 10a  . vitamin C  500 mg Oral QPC breakfast  . zinc sulfate  220 mg Oral Daily   Continuous Infusions: . remdesivir 100 mg in NS 100 mL 100 mg (04/05/19 1038)   PRN Meds:.acetaminophen, albuterol, lidocaine-prilocaine, mupirocin ointment, ondansetron **OR** ondansetron (ZOFRAN) IV, senna-docusate, sodium chloride flush  Antimicrobials: Anti-infectives (From admission, onward)   Start      Dose/Rate Route Frequency Ordered Stop   04/04/19 1000  remdesivir 100 mg in sodium chloride 0.9 % 100 mL IVPB  Status:  Discontinued     100 mg 200 mL/hr over 30 Minutes Intravenous Daily 04/03/19 1738 04/03/19 1917   04/04/19 1000  remdesivir 100 mg in sodium chloride 0.9 % 100 mL IVPB     100 mg 200 mL/hr over 30 Minutes Intravenous Daily 04/03/19 1613 04/08/19 0959   04/03/19 1738  remdesivir 200 mg in sodium chloride 0.9% 250 mL IVPB  Status:  Discontinued     200 mg 580 mL/hr over 30 Minutes Intravenous Once 04/03/19 1738 04/03/19 1917   04/03/19 1630  remdesivir 200 mg in sodium chloride 0.9% 250 mL IVPB     200 mg 580 mL/hr over 30 Minutes Intravenous Once 04/03/19 1613 04/03/19 1737       I have personally reviewed the following labs and images: CBC: Recent Labs  Lab 04/03/19 1303 04/04/19 0426 04/05/19 0500  WBC 3.3* 2.2* 3.0*  NEUTROABS 2.7 1.8 2.5  HGB 7.0* 6.7* 8.7*  HCT 23.0* 21.6* 27.6*  MCV 86.1 85.0 85.2  PLT 110* 116* 132*   BMP &GFR Recent Labs  Lab 04/03/19 1303 04/04/19 0426 04/05/19 0500  NA 136 135 138  K 3.8 3.8 3.6  CL 104 102 100  CO2 24 25 25   GLUCOSE 140* 128* 138*  BUN 26* 22 23  CREATININE 1.22 1.11 1.07  CALCIUM 8.6* 8.4* 8.4*  MG  --  1.6* 2.1  PHOS  --  2.3* 3.2   Estimated Creatinine Clearance: 48.8 mL/min (by C-G formula based on SCr of 1.07 mg/dL). Liver & Pancreas: Recent Labs  Lab 04/03/19 1303 04/04/19 0426 04/05/19 0500  AST 33 31 29  ALT 19 18 18   ALKPHOS 43 41 45  BILITOT 0.4 0.4 0.2*  PROT 5.8* 5.5* 5.9*  ALBUMIN 2.9* 2.8* 2.9*   No results for input(s): LIPASE, AMYLASE in the last 168 hours. No results for input(s): AMMONIA in the last 168 hours. Diabetic: Recent Labs    04/05/19 0500  HGBA1C 7.4*   Recent Labs  Lab 04/03/19 2302 04/04/19 1227 04/04/19 1708 04/04/19 2259 04/05/19 0750  GLUCAP 214* 226* 330* 238* 114*   Cardiac Enzymes: No results for input(s): CKTOTAL, CKMB, CKMBINDEX,  TROPONINI in the last 168 hours. No results for input(s): PROBNP in the last 8760 hours. Coagulation Profile: No results for input(s): INR, PROTIME in the last 168 hours. Thyroid Function Tests: No results for input(s): TSH, T4TOTAL, FREET4, T3FREE, THYROIDAB in the last 72 hours. Lipid Profile: Recent Labs    04/03/19 1303  TRIG 196*   Anemia Panel: Recent Labs    04/04/19 0426 04/04/19 2121 04/05/19 0500  VITAMINB12  --  2,628*  --   FOLATE  --  21.2  --   FERRITIN 49  --  57  TIBC  --  334  --   IRON  --  32*  --   RETICCTPCT  --  0.7  --    Urine analysis:    Component Value Date/Time   COLORURINE YELLOW 07/04/2018 1000   APPEARANCEUR TURBID (A) 07/04/2018 1000   LABSPEC 1.008 07/04/2018 1000   LABSPEC 1.020 04/24/2015 0846   PHURINE 6.0 07/04/2018 1000   GLUCOSEU >=500 (A) 07/04/2018 1000   GLUCOSEU 250 04/24/2015 0846   HGBUR LARGE (A) 07/04/2018 1000   BILIRUBINUR NEGATIVE 07/04/2018 1000   BILIRUBINUR Negative 04/24/2015 0846   KETONESUR NEGATIVE 07/04/2018 1000   PROTEINUR 30 (A) 07/04/2018 1000   UROBILINOGEN 0.2 04/24/2015 0846   NITRITE NEGATIVE 07/04/2018 1000   LEUKOCYTESUR LARGE (A) 07/04/2018 1000   LEUKOCYTESUR Negative 04/24/2015 0846   Sepsis Labs: Invalid input(s): PROCALCITONIN, [place]  Microbiology: Recent Results (from the past 240 hour(s))  Blood Culture (routine x 2)     Status: None (Preliminary result)   Collection Time: 04/03/19  1:03 PM   Specimen: BLOOD  Result Value Ref Range Status   Specimen Description   Final    BLOOD PORTA CATH Performed at [facility] [address], [place], [place] [address]    Special Requests   Final    BOTTLES DRAWN AEROBIC AND ANAEROBIC Blood Culture adequate volume Performed at [facility] [address] [place], [place] [address]    Culture   Final    NO GROWTH 2 DAYS Performed at [facility] [address], [place],  [address]      Report Status PENDING  Incomplete  Blood Culture (routine x 2)     Status: None (Preliminary result)  Collection Time: 04/03/19  1:03 PM   Specimen: BLOOD  Result Value Ref Range Status   Specimen Description   Final    BLOOD LEFT WRIST Performed at [facility] [address] [place], [place] [address]    Special Requests   Final    BOTTLES DRAWN AEROBIC AND ANAEROBIC Blood Culture adequate volume Performed at [facility] [address], [place], [place] [address]    Culture   Final    NO GROWTH 2 DAYS Performed at [facility] [address], [place], [place] [address]    Report Status PENDING  Incomplete    Radiology Studies: No results found.   Kannan Proia T. [place]  If 7PM-7AM, please contact night-coverage [email or web address] Password [email or web address] 04/05/2019, 12:05 PM

## 2019-04-05 NOTE — Progress Notes (Signed)
SATURATION QUALIFICATIONS: (This note is used to comply with regulatory documentation for home oxygen)  Patient Saturations on Room Air at Rest 92 %  Patient Saturations on Room Air while Ambulating 87% Patient Saturations on 2 Liters of oxygen while Ambulating 93 %  Please briefly explain why patient needs home oxygen:  Pt exhibits shortness of breathe during activties.   SRP, RN

## 2019-04-06 DIAGNOSIS — D500 Iron deficiency anemia secondary to blood loss (chronic): Secondary | ICD-10-CM

## 2019-04-06 DIAGNOSIS — E10621 Type 1 diabetes mellitus with foot ulcer: Secondary | ICD-10-CM

## 2019-04-06 DIAGNOSIS — L97429 Non-pressure chronic ulcer of left heel and midfoot with unspecified severity: Secondary | ICD-10-CM

## 2019-04-06 LAB — COMPREHENSIVE METABOLIC PANEL
ALT: 21 U/L (ref 0–44)
AST: 27 U/L (ref 15–41)
Albumin: 3 g/dL — ABNORMAL LOW (ref 3.5–5.0)
Alkaline Phosphatase: 48 U/L (ref 38–126)
Anion gap: 10 (ref 5–15)
BUN: 30 mg/dL — ABNORMAL HIGH (ref 8–23)
CO2: 24 mmol/L (ref 22–32)
Calcium: 8.7 mg/dL — ABNORMAL LOW (ref 8.9–10.3)
Chloride: 104 mmol/L (ref 98–111)
Creatinine, Ser: 1.05 mg/dL (ref 0.61–1.24)
GFR calc Af Amer: >60 mL/min (ref >60)
GFR calc non Af Amer: >60 mL/min (ref >60)
Glucose, Bld: 187 mg/dL — ABNORMAL HIGH (ref 70–99)
Potassium: 3.8 mmol/L (ref 3.5–5.1)
Sodium: 138 mmol/L (ref 135–145)
Total Bilirubin: 0.5 mg/dL (ref 0.3–1.2)
Total Protein: 5.7 g/dL — ABNORMAL LOW (ref 6.5–8.1)

## 2019-04-06 LAB — CBC WITH DIFFERENTIAL/PLATELET
Abs Immature Granulocytes: 0.13 10*3/uL — ABNORMAL HIGH (ref 0.00–0.07)
Basophils Absolute: 0 10*3/uL (ref 0.0–0.1)
Basophils Relative: 0 %
Eosinophils Absolute: 0 10*3/uL (ref 0.0–0.5)
Eosinophils Relative: 0 %
HCT: 25.9 % — ABNORMAL LOW (ref 39.0–52.0)
Hemoglobin: 8.2 g/dL — ABNORMAL LOW (ref 13.0–17.0)
Immature Granulocytes: 4 %
Lymphocytes Relative: 5 %
Lymphs Abs: 0.2 10*3/uL — ABNORMAL LOW (ref 0.7–4.0)
MCH: 27.2 pg (ref 26.0–34.0)
MCHC: 31.7 g/dL (ref 30.0–36.0)
MCV: 85.8 fL (ref 80.0–100.0)
Monocytes Absolute: 0.2 10*3/uL (ref 0.1–1.0)
Monocytes Relative: 7 %
Neutro Abs: 2.7 10*3/uL (ref 1.7–7.7)
Neutrophils Relative %: 84 %
Platelets: 134 10*3/uL — ABNORMAL LOW (ref 150–400)
RBC: 3.02 MIL/uL — ABNORMAL LOW (ref 4.22–5.81)
RDW: 15.9 % — ABNORMAL HIGH (ref 11.5–15.5)
WBC: 3.3 10*3/uL — ABNORMAL LOW (ref 4.0–10.5)
nRBC: 0 % (ref 0.0–0.2)

## 2019-04-06 LAB — C-REACTIVE PROTEIN: CRP: 4.1 mg/dL — ABNORMAL HIGH (ref <1.0)

## 2019-04-06 LAB — GLUCOSE, CAPILLARY
Glucose-Capillary: 110 mg/dL — ABNORMAL HIGH (ref 70–99)
Glucose-Capillary: 169 mg/dL — ABNORMAL HIGH (ref 70–99)

## 2019-04-06 LAB — D-DIMER, QUANTITATIVE: D-Dimer, Quant: 0.57 ug/mL-FEU — ABNORMAL HIGH (ref 0.00–0.50)

## 2019-04-06 LAB — PHOSPHORUS: Phosphorus: 2.3 mg/dL — ABNORMAL LOW (ref 2.5–4.6)

## 2019-04-06 LAB — MAGNESIUM: Magnesium: 2 mg/dL (ref 1.7–2.4)

## 2019-04-06 LAB — FERRITIN: Ferritin: 69 ng/mL (ref 24–336)

## 2019-04-06 MED ORDER — DM-GUAIFENESIN ER 30-600 MG PO TB12: 1.0000 | ORAL_TABLET | Freq: Two times a day (BID) | ORAL | Status: DC

## 2019-04-06 MED ORDER — FERROUS SULFATE 325 (65 FE) MG PO TABS: 325.0000 mg | ORAL_TABLET | Freq: Two times a day (BID) | ORAL | 1 refills | Status: DC

## 2019-04-06 MED ORDER — POLYETHYLENE GLYCOL 3350 17 GM/SCOOP PO POWD: 17.0000 g | Freq: Two times a day (BID) | ORAL | 1 refills | Status: AC | PRN

## 2019-04-06 MED ORDER — SENNOSIDES-DOCUSATE SODIUM 8.6-50 MG PO TABS: 1.0000 | ORAL_TABLET | Freq: Two times a day (BID) | ORAL | 1 refills | Status: AC | PRN

## 2019-04-06 MED ORDER — HEPARIN SOD (PORK) LOCK FLUSH 100 UNIT/ML IV SOLN: 500.0000 [IU] | INTRAVENOUS | Status: DC | PRN

## 2019-04-06 MED ORDER — LINAGLIPTIN 5 MG PO TABS: 5.0000 mg | ORAL_TABLET | Freq: Every day | ORAL | 1 refills | Status: DC

## 2019-04-06 MED ORDER — LINAGLIPTIN 5 MG PO TABS: 5.0000 mg | ORAL_TABLET | Freq: Every day | ORAL | Status: DC

## 2019-04-06 NOTE — Discharge Instructions (Signed)

## 2019-04-06 NOTE — Discharge Summary (Signed)
Physician Discharge Summary  [name] EKC:[id number] DOB: 31-Jan-1932 DOA: 04/03/2019  PCP: [name], MD  Admit date: 04/03/2019 Discharge date: 04/06/2019  Admitted From: Home Disposition: Home  Recommendations for Outpatient Follow-up:  1. Follow ups as below. 2. Please obtain CBC/BMP/Mag at follow up 3. Please follow up on the following pending results: None  Home Health: None Equipment/Devices: None  Discharge Condition: Stable CODE STATUS: DNR/DNI Follow-up Information    [name], MD. Schedule an appointment as soon as possible for a visit in 2 week(s).   Specialty: Internal Medicine Contact information: [address] [place] [address] [phone]        Sanda Klein, MD .   Specialty: Cardiology Contact information: [address] [place] [address] [phone]           Hospital Course: 85 y.o.malewith medical history significant ofHTN, HLD, T2DM, PAF, CAD, IDA, primary hepatic NHL, retroperitoneal recurrence s/p RICE 2015, Rituxan 2015, metastatic Prostate Ca, Skin Ca and OSA who tested positive for Covid on 12/4 at PCP office, presented 12/9 with progressive fatigue, dyspnea, fever and diarrhea.  Reportedly desaturated to 86% at home when daughter checked on him. Patient reports fair amount of bleeding when he had bilateral ureteral stent replacement 2 weeks ago.  Denies active bleeding, melena or hematochezia.  In ED, HDS.  Saturating in mid 90s on RA.  No significant findings on CMP.  CRP 6.2.  Pro-Cal 0.2.  CBC remarkable for pancytopenia with WBC to 3.3, Hgb to 7.0 and platelet to 110.  POC COVID-19 positive.  CXR with multifocal pneumonia consistent with Covid infection.  Blood cultures drawn.  Admitted and started on remdesivir and Decadron.  Hgb dropped further to 6.7 overnight.    Transfused 1 unit with appropriate response.  H&H remained stable.  Anemia panel consistent with iron  deficiency.  Received IV Feraheme 510 mg once and discharged on ferrous sulfate.   On the day of discharge, maintained saturation in 90s with ambulation on room air without distress.  Felt well and ready to go home.  Infection and return precautions discussed with patient and patient's daughter who voiced understanding.  Patient's daughter is also infected with COVID-19.  See individual problem list below for more on hospital course.  Discharge Diagnoses:  Acute respiratory failure with hypoxia due to COVID-19 infection/pneumonia.  Anemia could contribute. Desaturated to 86% at home prior to arrival.  Had elevated CRP and CXR consistent with Covid pneumonia.  Respiratory failure resolved.  Inflammatory markers improved.  Maintain saturation in 90s with ambulation on RA without distress. -Remdesivir and Decadron 12/9>> 12/12 Recent Labs    04/04/19 0426 04/05/19 0500 04/06/19 0346  DDIMER 0.53* 0.59* 0.57*  FERRITIN 49 57 69  CRP 7.5* 7.3* 4.1*   Symptomatic anemia/fatigue: denies bleeding other than recent bleed at urology office during the stent exchange 2 weeks ago. Anemia panel consistent with iron deficiency.  Transfused IV Feraheme 510 mg once.  Symptoms resolved. -Hgb 9-10 (baseline)> 7.0 (admit)> 6.7>1u> 8.7 (exaggerated)> 8.2 -Ferrous sulfate and bowel regimen. -Recheck CBC at follow-up  Uncontrolled DM-2 with hyperglycemia: A1c 7.4%. Recent Labs    04/05/19 2037 04/06/19 0800 04/06/19 1243  GLUCAP 328* 110* 169*  -Discharged on home Toujeo, Metformin, statin and gabapentin -Added Tradjenta 5 mg daily at discharge.  Pancytopenia: Improving. -Recheck CBC at follow-up.  Paroxysmal A. Fib: In NSR.  Not on rate or rhythm control medications. -Continue Xarelto.  Chronic CAD: Stable.  No anginal  symptoms. -Continue statin and Xarelto.  History of depression: Stable. -Continue home Zoloft  History of primary hepatic NHL with retroperitoneal recurrence s/p RICE  2015, Rituxan 2015. -Outpatient follow-up.  Metastatic prostate cancer on Lupron -Outpatient follow-up.  Pancreatic insufficiency -Continue home Creon.  History of glaucoma: Stable -Continue home eyedrops.  Hypomagnesemia and hypophosphatemia: Resolved  Diabetic wound over left food: POA.  No signs of infection.  Followed by podiatry. -Outpatient follow-up   Discharge Instructions  Discharge Instructions    Call MD for:  difficulty breathing, headache or visual disturbances   Complete by: As directed    Call MD for:  extreme fatigue   Complete by: As directed    Call MD for:  persistant dizziness or light-headedness   Complete by: As directed    Call MD for:  persistant nausea and vomiting   Complete by: As directed    Call MD for:  severe uncontrolled pain   Complete by: As directed    Diet - low sodium heart healthy   Complete by: As directed    Diet Carb Modified   Complete by: As directed    Increase activity slowly   Complete by: As directed    MyChart COVID-19 home monitoring program   Complete by: Apr 06, 2019    Is the patient willing to use the [facility] for home monitoring?: Yes   Temperature monitoring   Complete by: Apr 06, 2019    After how many days would you like to receive a notification of this patient's flowsheet entries?: 1     Allergies as of 04/06/2019      Reactions   Atenolol Other (See Comments)   "Heart rate slowed- STOPPED on 08/16/2013"   Niacin Other (See Comments)   headaches      Medication List    TAKE these medications   allopurinol 300 MG tablet Commonly known as: ZYLOPRIM Take 300 mg by mouth daily after breakfast.   atorvastatin 20 MG tablet Commonly known as: LIPITOR Take 20 mg by mouth every evening.   CALCIUM 600/VITAMIN D3 PO Take 1,200 mg by mouth daily.   cetirizine 10 MG tablet Commonly known as: ZYRTEC Take 10 mg by mouth daily.   dextromethorphan-guaiFENesin 30-600 MG 12hr tablet Commonly  known as: MUCINEX DM Take 1 tablet by mouth 2 (two) times daily.   fenofibrate 160 MG tablet Take 160 mg by mouth every evening.   ferrous sulfate 325 (65 FE) MG tablet Take 1 tablet (325 mg total) by mouth 2 (two) times daily with a meal.   gabapentin 300 MG capsule Commonly known as: NEURONTIN Take 300 mg by mouth 3 (three) times daily.   latanoprost 0.005 % ophthalmic solution Commonly known as: XALATAN Place 1 drop into both eyes at bedtime.   leuprolide 22.5 MG injection Commonly known as: LUPRON Inject 22.5 mg into the muscle every 3 (three) months.   linagliptin 5 MG Tabs tablet Commonly known as: TRADJENTA Take 1 tablet (5 mg total) by mouth daily.   lipase/protease/amylase 12000 units Cpep capsule Commonly known as: CREON Take 12,000 Units by mouth 3 (three) times daily with meals.   magnesium oxide 400 MG tablet Commonly known as: MAG-OX Take 400 mg by mouth 2 (two) times daily.   metFORMIN 500 MG 24 hr tablet Commonly known as: GLUCOPHAGE-XR Take 500 mg by mouth 2 (two) times a day.   mupirocin ointment 2 % Commonly known as: BACTROBAN Apply 1 application topically daily.   One-A-Day  Mens 50+ Advantage Tabs Take 1 tablet by mouth daily with breakfast.   polyethylene glycol powder 17 GM/SCOOP powder Commonly known as: MiraLax Take 17 g by mouth 2 (two) times daily as needed for moderate constipation.   Rivaroxaban 15 MG Tabs tablet Commonly known as: XARELTO Take 15 mg by mouth daily with lunch.   senna-docusate 8.6-50 MG tablet Commonly known as: Senokot-S Take 1 tablet by mouth 2 (two) times daily between meals as needed for mild constipation.   sertraline 100 MG tablet Commonly known as: ZOLOFT Take 100 mg by mouth daily after breakfast.   Toujeo SoloStar 300 UNIT/ML Sopn Generic drug: Insulin Glargine (1 Unit Dial) Inject 10-12 Units into the skin daily. What changed: Another medication with the same name was removed. Continue taking this  medication, and follow the directions you see here.   Vitamin B-12 2500 MCG Subl Place 2,500 mcg under the tongue every morning.   vitamin C 500 MG tablet Commonly known as: ASCORBIC ACID Take 500 mg by mouth daily after breakfast.       Consultations:  None  Procedures/Studies:  2D Echo none   DG Chest Port 1 View  Result Date: 04/03/2019 CLINICAL DATA:  [name], COVID-19 positive 03/29/2019 EXAM: PORTABLE CHEST 1 VIEW COMPARISON:  Coronary CT, chest radiographs 07/03/2018, 10/08/2017 FINDINGS: Multifocal areas of airspace opacity most pronounced in the right infrahilar lung and left lung periphery. Suspect some left pleural thickening may reflect small effusion or scarring. No right effusion. No pneumothorax. Right subclavian approach Port-A-Cath tip terminates in the right atrium. Currently accessed at this time via [facility] needle. The aorta is calcified. The remaining cardiomediastinal contours are unremarkable. No acute osseous or soft tissue abnormality. Degenerative changes are present in the imaged spine and shoulders. IMPRESSION: 1. Multifocal areas of airspace opacity compatible with a multifocal pneumonia including viral etiologies such as COVID-19. 2. Suspect small left pleural effusion or scarring. 3.  Aortic Atherosclerosis (ICD10-I70.0). Electronically Signed   By: Lovena Le M.D.   On: 04/03/2019 14:03   DG C-Arm 1-60 Min-No Report  Result Date: 03/18/2019 Fluoroscopy was utilized by the requesting physician.  No radiographic interpretation.       Discharge Exam: Vitals:   04/06/19 0513 04/06/19 1244  BP: 131/84 109/60  Pulse: 68 69  Resp:  17  Temp: 97.7 F (36.5 C) 98.4 F (36.9 C)  SpO2: 93% 93%    GENERAL: No acute distress.  Appears well.  HEENT: MMM.  Vision and hearing grossly intact.  NECK: Supple.  No apparent JVD.  RESP:  No IWOB. Good air movement bilaterally. CVS:  RRR. Heart sounds normal.  ABD/GI/GU: Bowel sounds present.  Soft. Non tender.  MSK/EXT:  Moves extremities. No apparent deformity or edema.  SKIN: Diabetic foot ulcer over the plantar aspect of left foot medially about 2 cm in diameter.  No apparent signs of infection or drainage. NEURO: Awake, alert and oriented appropriately.  No apparent focal neuro deficit. PSYCH: Calm. Normal affect.   The results of significant diagnostics from this hospitalization (including imaging, microbiology, ancillary and laboratory) are listed below for reference.     Microbiology: Recent Results (from the past 240 hour(s))  Blood Culture (routine x 2)     Status: None (Preliminary result)   Collection Time: 04/03/19  1:03 PM   Specimen: BLOOD  Result Value Ref Range Status   Specimen Description   Final    BLOOD PORTA CATH Performed at [facility], [address] [place], [place] [address]    Special Requests   Final    BOTTLES DRAWN AEROBIC AND ANAEROBIC Blood Culture adequate volume Performed at [facility] [address] [place], [place] [address]    Culture   Final    NO GROWTH 3 DAYS Performed at [facility], [place] [address] [place], [place] [address]    Report Status PENDING  Incomplete  Blood Culture (routine x 2)     Status: None (Preliminary result)   Collection Time: 04/03/19  1:03 PM   Specimen: BLOOD  Result Value Ref Range Status   Specimen Description   Final    BLOOD LEFT WRIST Performed at [facility] [address] [place], [place] [address]    Special Requests   Final    BOTTLES DRAWN AEROBIC AND ANAEROBIC Blood Culture adequate volume Performed at [facility] [address], [place], [place] [address]    Culture   Final    NO GROWTH 3 DAYS Performed at [facility], [place] [address], [place], [place] [address]    Report Status PENDING  Incomplete     Labs: BNP (last 3 results) No results for input(s): BNP in the last 8760  hours. Basic Metabolic Panel: Recent Labs  Lab 04/03/19 1303 04/04/19 0426 04/05/19 0500 04/06/19 0346  NA 136 135 138 138  K 3.8 3.8 3.6 3.8  CL 104 102 100 104  CO2 24 25 25 24   GLUCOSE 140* 128* 138* 187*  BUN 26* 22 23 30*  CREATININE 1.22 1.11 1.07 1.05  CALCIUM 8.6* 8.4* 8.4* 8.7*  MG  --  1.6* 2.1 2.0  PHOS  --  2.3* 3.2 2.3*   Liver Function Tests: Recent Labs  Lab 04/03/19 1303 04/04/19 0426 04/05/19 0500 04/06/19 0346  AST 33 31 29 27   ALT 19 18 18 21   ALKPHOS 43 41 45 48  BILITOT 0.4 0.4 0.2* 0.5  PROT 5.8* 5.5* 5.9* 5.7*  ALBUMIN 2.9* 2.8* 2.9* 3.0*   No results for input(s): LIPASE, AMYLASE in the last 168 hours. No results for input(s): AMMONIA in the last 168 hours. CBC: Recent Labs  Lab 04/03/19 1303 04/04/19 0426 04/05/19 0500 04/06/19 0346  WBC 3.3* 2.2* 3.0* 3.3*  NEUTROABS 2.7 1.8 2.5 2.7  HGB 7.0* 6.7* 8.7* 8.2*  HCT 23.0* 21.6* 27.6* 25.9*  MCV 86.1 85.0 85.2 85.8  PLT 110* 116* 132* 134*   Cardiac Enzymes: No results for input(s): CKTOTAL, CKMB, CKMBINDEX, TROPONINI in the last 168 hours. BNP: Invalid input(s): POCBNP CBG: Recent Labs  Lab 04/05/19 1223 04/05/19 1704 04/05/19 2037 04/06/19 0800 04/06/19 1243  GLUCAP 186* 245* 328* 110* 169*   D-Dimer Recent Labs    04/05/19 0500 04/06/19 0346  DDIMER 0.59* 0.57*   Hgb A1c Recent Labs    04/05/19 0500  HGBA1C 7.4*   Lipid Profile No results for input(s): CHOL, HDL, LDLCALC, TRIG, CHOLHDL, LDLDIRECT in the last 72 hours. Thyroid function studies No results for input(s): TSH, T4TOTAL, T3FREE, THYROIDAB in the last 72 hours.  Invalid input(s): FREET3 Anemia work up Recent Labs    04/04/19 2121 04/05/19 0500 04/06/19 0346  VITAMINB12 2,628*  --   --   FOLATE 21.2  --   --   FERRITIN  --  57 69  TIBC 334  --   --   IRON 32*  --   --   RETICCTPCT 0.7  --   --  Urinalysis    Component Value Date/Time   COLORURINE YELLOW 07/04/2018 1000   APPEARANCEUR  TURBID (A) 07/04/2018 1000   LABSPEC 1.008 07/04/2018 1000   LABSPEC 1.020 04/24/2015 0846   PHURINE 6.0 07/04/2018 1000   GLUCOSEU >=500 (A) 07/04/2018 1000   GLUCOSEU 250 04/24/2015 0846   HGBUR LARGE (A) 07/04/2018 1000   BILIRUBINUR NEGATIVE 07/04/2018 1000   BILIRUBINUR Negative 04/24/2015 0846   KETONESUR NEGATIVE 07/04/2018 1000   PROTEINUR 30 (A) 07/04/2018 1000   UROBILINOGEN 0.2 04/24/2015 0846   NITRITE NEGATIVE 07/04/2018 1000   LEUKOCYTESUR LARGE (A) 07/04/2018 1000   LEUKOCYTESUR Negative 04/24/2015 0846   Sepsis Labs Invalid input(s): PROCALCITONIN,  WBC,  LACTICIDVEN   Time coordinating discharge: 40 minutes  SIGNED:  Mercy Riding, MD  [facility] 04/06/2019, 3:39 PM  If 7PM-7AM, please contact night-coverage [email or web address] Password [email or web address]

## 2019-04-08 LAB — CULTURE, BLOOD (ROUTINE X 2)
Culture: NO GROWTH
Culture: NO GROWTH
Special Requests: ADEQUATE
Special Requests: ADEQUATE

## 2019-04-10 ENCOUNTER — Encounter (INDEPENDENT_AMBULATORY_CARE_PROVIDER_SITE_OTHER): Payer: Self-pay
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
## 2019-04-30 ENCOUNTER — Inpatient Hospital Stay (HOSPITAL_BASED_OUTPATIENT_CLINIC_OR_DEPARTMENT_OTHER): Payer: Medicare Other | Admitting: Oncology

## 2019-04-30 ENCOUNTER — Other Ambulatory Visit: Payer: Self-pay

## 2019-04-30 ENCOUNTER — Inpatient Hospital Stay: Payer: Medicare Other

## 2019-04-30 ENCOUNTER — Inpatient Hospital Stay: Payer: Medicare Other | Attending: Oncology

## 2019-04-30 VITALS — BP 101/61 | HR 85 | Temp 97.3°F | Resp 17 | Ht 68.0 in | Wt 154.3 lb

## 2019-04-30 VITALS — BP 124/58 | HR 72 | Temp 99.1°F | Resp 18

## 2019-04-30 DIAGNOSIS — Z87891 Personal history of nicotine dependence: Secondary | ICD-10-CM | POA: Diagnosis not present

## 2019-04-30 DIAGNOSIS — N189 Chronic kidney disease, unspecified: Secondary | ICD-10-CM | POA: Diagnosis not present

## 2019-04-30 DIAGNOSIS — C61 Malignant neoplasm of prostate: Secondary | ICD-10-CM

## 2019-04-30 DIAGNOSIS — Z5112 Encounter for antineoplastic immunotherapy: Secondary | ICD-10-CM | POA: Diagnosis not present

## 2019-04-30 DIAGNOSIS — C8228 Follicular lymphoma grade III, unspecified, lymph nodes of multiple sites: Secondary | ICD-10-CM

## 2019-04-30 DIAGNOSIS — C8580 Other specified types of non-Hodgkin lymphoma, unspecified site: Secondary | ICD-10-CM

## 2019-04-30 DIAGNOSIS — K219 Gastro-esophageal reflux disease without esophagitis: Secondary | ICD-10-CM | POA: Diagnosis not present

## 2019-04-30 DIAGNOSIS — Z801 Family history of malignant neoplasm of trachea, bronchus and lung: Secondary | ICD-10-CM | POA: Insufficient documentation

## 2019-04-30 DIAGNOSIS — N1832 Chronic kidney disease, stage 3b: Secondary | ICD-10-CM

## 2019-04-30 DIAGNOSIS — C8590 Non-Hodgkin lymphoma, unspecified, unspecified site: Secondary | ICD-10-CM

## 2019-04-30 DIAGNOSIS — D509 Iron deficiency anemia, unspecified: Secondary | ICD-10-CM | POA: Insufficient documentation

## 2019-04-30 DIAGNOSIS — E1140 Type 2 diabetes mellitus with diabetic neuropathy, unspecified: Secondary | ICD-10-CM | POA: Diagnosis not present

## 2019-04-30 DIAGNOSIS — I129 Hypertensive chronic kidney disease with stage 1 through stage 4 chronic kidney disease, or unspecified chronic kidney disease: Secondary | ICD-10-CM | POA: Insufficient documentation

## 2019-04-30 DIAGNOSIS — Z794 Long term (current) use of insulin: Secondary | ICD-10-CM | POA: Diagnosis not present

## 2019-04-30 DIAGNOSIS — Z7901 Long term (current) use of anticoagulants: Secondary | ICD-10-CM | POA: Insufficient documentation

## 2019-04-30 DIAGNOSIS — I480 Paroxysmal atrial fibrillation: Secondary | ICD-10-CM | POA: Diagnosis not present

## 2019-04-30 DIAGNOSIS — C8219 Follicular lymphoma grade II, extranodal and solid organ sites: Secondary | ICD-10-CM

## 2019-04-30 DIAGNOSIS — M109 Gout, unspecified: Secondary | ICD-10-CM | POA: Diagnosis not present

## 2019-04-30 DIAGNOSIS — Z85828 Personal history of other malignant neoplasm of skin: Secondary | ICD-10-CM | POA: Diagnosis not present

## 2019-04-30 DIAGNOSIS — E1122 Type 2 diabetes mellitus with diabetic chronic kidney disease: Secondary | ICD-10-CM | POA: Insufficient documentation

## 2019-04-30 DIAGNOSIS — Z79899 Other long term (current) drug therapy: Secondary | ICD-10-CM | POA: Diagnosis not present

## 2019-04-30 DIAGNOSIS — D631 Anemia in chronic kidney disease: Secondary | ICD-10-CM

## 2019-04-30 DIAGNOSIS — E785 Hyperlipidemia, unspecified: Secondary | ICD-10-CM | POA: Diagnosis not present

## 2019-04-30 DIAGNOSIS — E0822 Diabetes mellitus due to underlying condition with diabetic chronic kidney disease: Secondary | ICD-10-CM

## 2019-04-30 DIAGNOSIS — C8203 Follicular lymphoma grade I, intra-abdominal lymph nodes: Secondary | ICD-10-CM

## 2019-04-30 DIAGNOSIS — Z7189 Other specified counseling: Secondary | ICD-10-CM

## 2019-04-30 LAB — CBC WITH DIFFERENTIAL/PLATELET
Abs Immature Granulocytes: 0.46 10*3/uL — ABNORMAL HIGH (ref 0.00–0.07)
Basophils Absolute: 0 10*3/uL (ref 0.0–0.1)
Basophils Relative: 0 %
Eosinophils Absolute: 0.1 10*3/uL (ref 0.0–0.5)
Eosinophils Relative: 2 %
HCT: 28.3 % — ABNORMAL LOW (ref 39.0–52.0)
Hemoglobin: 9.2 g/dL — ABNORMAL LOW (ref 13.0–17.0)
Immature Granulocytes: 7 %
Lymphocytes Relative: 2 %
Lymphs Abs: 0.1 10*3/uL — ABNORMAL LOW (ref 0.7–4.0)
MCH: 27.4 pg (ref 26.0–34.0)
MCHC: 32.5 g/dL (ref 30.0–36.0)
MCV: 84.2 fL (ref 80.0–100.0)
Monocytes Absolute: 0.6 10*3/uL (ref 0.1–1.0)
Monocytes Relative: 8 %
Neutro Abs: 5.8 10*3/uL (ref 1.7–7.7)
Neutrophils Relative %: 81 %
Platelets: 160 10*3/uL (ref 150–400)
RBC: 3.36 MIL/uL — ABNORMAL LOW (ref 4.22–5.81)
RDW: 17.7 % — ABNORMAL HIGH (ref 11.5–15.5)
WBC: 7.1 10*3/uL (ref 4.0–10.5)
nRBC: 0 % (ref 0.0–0.2)

## 2019-04-30 LAB — CMP (CANCER CENTER ONLY)
ALT: 28 U/L (ref 0–44)
AST: 39 U/L (ref 15–41)
Albumin: 3.3 g/dL — ABNORMAL LOW (ref 3.5–5.0)
Alkaline Phosphatase: 51 U/L (ref 38–126)
Anion gap: 11 (ref 5–15)
BUN: 25 mg/dL — ABNORMAL HIGH (ref 8–23)
CO2: 24 mmol/L (ref 22–32)
Calcium: 9.1 mg/dL (ref 8.9–10.3)
Chloride: 100 mmol/L (ref 98–111)
Creatinine: 1.14 mg/dL (ref 0.61–1.24)
GFR, Est AFR Am: >60 mL/min (ref >60)
GFR, Estimated: 58 mL/min — ABNORMAL LOW (ref >60)
Glucose, Bld: 245 mg/dL — ABNORMAL HIGH (ref 70–99)
Potassium: 3.9 mmol/L (ref 3.5–5.1)
Sodium: 135 mmol/L (ref 135–145)
Total Bilirubin: 0.5 mg/dL (ref 0.3–1.2)
Total Protein: 6.2 g/dL — ABNORMAL LOW (ref 6.5–8.1)

## 2019-04-30 LAB — LACTATE DEHYDROGENASE: LDH: 157 U/L (ref 98–192)

## 2019-04-30 MED ORDER — SODIUM CHLORIDE 0.9 % IV SOLN
Freq: Once | INTRAVENOUS | Status: AC
  Filled 2019-04-30: qty 250

## 2019-04-30 MED ORDER — ACETAMINOPHEN 325 MG PO TABS
650.0000 mg | ORAL_TABLET | Freq: Once | ORAL | Status: AC
  Administered 2019-04-30: 650 mg via ORAL

## 2019-04-30 MED ORDER — SODIUM CHLORIDE 0.9 % IV SOLN
350.0000 mg/m2 | Freq: Once | INTRAVENOUS | Status: AC
  Administered 2019-04-30: 700 mg via INTRAVENOUS
  Filled 2019-04-30: qty 20

## 2019-04-30 MED ORDER — DIPHENHYDRAMINE HCL 25 MG PO CAPS
25.0000 mg | ORAL_CAPSULE | Freq: Once | ORAL | Status: AC
  Administered 2019-04-30: 25 mg via ORAL

## 2019-04-30 MED ORDER — SODIUM CHLORIDE 0.9% FLUSH
10.0000 mL | Freq: Once | INTRAVENOUS | Status: AC
  Administered 2019-04-30: 10 mL via INTRAVENOUS
  Filled 2019-04-30: qty 10

## 2019-04-30 MED ORDER — HEPARIN SOD (PORK) LOCK FLUSH 100 UNIT/ML IV SOLN
500.0000 [IU] | Freq: Once | INTRAVENOUS | Status: AC | PRN
  Administered 2019-04-30: 500 [IU]
  Filled 2019-04-30: qty 5

## 2019-04-30 MED ORDER — ACETAMINOPHEN 325 MG PO TABS
ORAL_TABLET | ORAL | Status: AC
  Filled 2019-04-30: qty 2

## 2019-04-30 MED ORDER — DIPHENHYDRAMINE HCL 25 MG PO CAPS
ORAL_CAPSULE | ORAL | Status: AC
  Filled 2019-04-30: qty 1

## 2019-04-30 MED ORDER — SODIUM CHLORIDE 0.9 % IJ SOLN: 10.0000 mL | INTRAMUSCULAR | Status: DC | PRN

## 2019-04-30 NOTE — Patient Instructions (Signed)
Rituximab injection What is this medicine? RITUXIMAB (ri TUX i mab) is a monoclonal antibody. It is used to treat certain types of cancer like non-Hodgkin lymphoma and chronic lymphocytic leukemia. It is also used to treat rheumatoid arthritis, granulomatosis with polyangiitis (or Wegener's granulomatosis), microscopic polyangiitis, and pemphigus vulgaris. This medicine may be used for other purposes; ask your health care provider or pharmacist if you have questions. COMMON BRAND NAME(S): Rituxan, RUXIENCE What should I tell my health care provider before I take this medicine? They need to know if you have any of these conditions:  heart disease  infection (especially a virus infection such as hepatitis B, chickenpox, cold sores, or herpes)  immune system problems  irregular heartbeat  kidney disease  low blood counts, like low white cell, platelet, or red cell counts  lung or breathing disease, like asthma  recently received or scheduled to receive a vaccine  an unusual or allergic reaction to rituximab, other medicines, foods, dyes, or preservatives  pregnant or trying to get pregnant  breast-feeding How should I use this medicine? This medicine is for infusion into a vein. It is administered in a hospital or clinic by a specially trained health care professional. A special MedGuide will be given to you by the pharmacist with each prescription and refill. Be sure to read this information carefully each time. Talk to your pediatrician regarding the use of this medicine in children. This medicine is not approved for use in children. Overdosage: If you think you have taken too much of this medicine contact a poison control center or emergency room at once. NOTE: This medicine is only for you. Do not share this medicine with others. What if I miss a dose? It is important not to miss a dose. Call your doctor or health care professional if you are unable to keep an appointment. What  may interact with this medicine?  cisplatin  live virus vaccines This list may not describe all possible interactions. Give your health care provider a list of all the medicines, herbs, non-prescription drugs, or dietary supplements you use. Also tell them if you smoke, drink alcohol, or use illegal drugs. Some items may interact with your medicine. What should I watch for while using this medicine? Your condition will be monitored carefully while you are receiving this medicine. You may need blood work done while you are taking this medicine. This medicine can cause serious allergic reactions. To reduce your risk you may need to take medicine before treatment with this medicine. Take your medicine as directed. In some patients, this medicine may cause a serious brain infection that may cause death. If you have any problems seeing, thinking, speaking, walking, or standing, tell your healthcare professional right away. If you cannot reach your healthcare professional, urgently seek other source of medical care. Call your doctor or health care professional for advice if you get a fever, chills or sore throat, or other symptoms of a cold or flu. Do not treat yourself. This drug decreases your body's ability to fight infections. Try to avoid being around people who are sick. Do not become pregnant while taking this medicine or for at least 12 months after stopping it. Women should inform their doctor if they wish to become pregnant or think they might be pregnant. There is a potential for serious side effects to an unborn child. Talk to your health care professional or pharmacist for more information. Do not breast-feed an infant while taking this medicine or for at   least 6 months after stopping it. What side effects may I notice from receiving this medicine? Side effects that you should report to your doctor or health care professional as soon as possible:  allergic reactions like skin rash, itching or  hives; swelling of the face, lips, or tongue  breathing problems  chest pain  changes in vision  diarrhea  headache with fever, neck stiffness, sensitivity to light, nausea, or confusion  fast, irregular heartbeat  loss of memory  low blood counts - this medicine may decrease the number of white blood cells, red blood cells and platelets. You may be at increased risk for infections and bleeding.  mouth sores  problems with balance, talking, or walking  redness, blistering, peeling or loosening of the skin, including inside the mouth  signs of infection - fever or chills, cough, sore throat, pain or difficulty passing urine  signs and symptoms of kidney injury like trouble passing urine or change in the amount of urine  signs and symptoms of liver injury like dark yellow or brown urine; general ill feeling or flu-like symptoms; light-colored stools; loss of appetite; nausea; right upper belly pain; unusually weak or tired; yellowing of the eyes or skin  signs and symptoms of low blood pressure like dizziness; feeling faint or lightheaded, falls; unusually weak or tired  stomach pain  swelling of the ankles, feet, hands  unusual bleeding or bruising  vomiting Side effects that usually do not require medical attention (report to your doctor or health care professional if they continue or are bothersome):  headache  joint pain  muscle cramps or muscle pain  nausea  tiredness This list may not describe all possible side effects. Call your doctor for medical advice about side effects. You may report side effects to FDA at 1-800-FDA-1088. Where should I keep my medicine? This drug is given in a hospital or clinic and will not be stored at home. NOTE: This sheet is a summary. It may not cover all possible information. If you have questions about this medicine, talk to your doctor, pharmacist, or health care provider.  2020 Elsevier/Gold Standard (2018-05-23  22:01:36)  Coronavirus (COVID-19) Are you at risk?  Are you at risk for the Coronavirus (COVID-19)?  To be considered HIGH RISK for Coronavirus (COVID-19), you have to meet the following criteria:  . Traveled to [place], [place], [place], [place] or [place]; or in the [place] to [place], [place], [place], or [place]; and have fever, cough, and shortness of breath within the last 2 weeks of travel OR . Been in close contact with a person diagnosed with COVID-19 within the last 2 weeks and have fever, cough, and shortness of breath . IF YOU DO NOT MEET THESE CRITERIA, YOU ARE CONSIDERED LOW RISK FOR COVID-19.  What to do if you are HIGH RISK for COVID-19?  Marland Kitchen If you are having a medical emergency, call 911. . Seek medical care right away. Before you go to a doctor's office, urgent care or emergency department, call ahead and tell them about your recent travel, contact with someone diagnosed with COVID-19, and your symptoms. You should receive instructions from your physician's office regarding next steps of care.  . When you arrive at healthcare provider, tell the healthcare staff immediately you have returned from visiting [place], [place], [place], [place] or [place]; or traveled in the [place] to [place], [place], [place], or [place]; in the last two weeks or you have been in  close contact with a person diagnosed with COVID-19 in the last 2 weeks.   . Tell the health care staff about your symptoms: fever, cough and shortness of breath. . After you have been seen by a medical provider, you will be either: o Tested for (COVID-19) and discharged home on quarantine except to seek medical care if symptoms worsen, and asked to  - Stay home and avoid contact with others until you get your results (4-5 days)  - Avoid travel on public transportation if possible (such as bus, train, or airplane) or o Sent to the Emergency Department by EMS for evaluation, COVID-19 testing, and  possible admission depending on your condition and test results.  What to do if you are LOW RISK for COVID-19?  Reduce your risk of any infection by using the same precautions used for avoiding the common cold or flu:  Marland Kitchen Wash your hands often with soap and warm water for at least 20 seconds.  If soap and water are not readily available, use an alcohol-based hand sanitizer with at least 60% alcohol.  . If coughing or sneezing, cover your mouth and nose by coughing or sneezing into the elbow areas of your shirt or coat, into a tissue or into your sleeve (not your hands). . Avoid shaking hands with others and consider head nods or verbal greetings only. . Avoid touching your eyes, nose, or mouth with unwashed hands.  . Avoid close contact with people who are sick. . Avoid places or events with large numbers of people in one location, like concerts or sporting events. . Carefully consider travel plans you have or are making. . If you are planning any travel outside or inside the [place], visit the CDC's Travelers' Health webpage for the latest health notices. . If you have some symptoms but not all symptoms, continue to monitor at home and seek medical attention if your symptoms worsen. . If you are having a medical emergency, call 911.   [place] / e-Visit: [email or web address]         [facility]: [facility]: [phone]                   [facility]: [phone]

## 2019-04-30 NOTE — Patient Instructions (Signed)

## 2019-05-03 ENCOUNTER — Telehealth: Payer: Self-pay | Admitting: Oncology

## 2019-05-03 NOTE — Telephone Encounter (Signed)
I left a message regarding schedule  

## 2019-05-06 ENCOUNTER — Other Ambulatory Visit: Payer: Self-pay | Admitting: Oncology

## 2019-05-18 ENCOUNTER — Other Ambulatory Visit: Payer: Self-pay | Admitting: Nurse Practitioner

## 2019-05-21 ENCOUNTER — Other Ambulatory Visit: Payer: Self-pay

## 2019-05-21 ENCOUNTER — Encounter: Payer: Self-pay | Admitting: Podiatry

## 2019-05-21 ENCOUNTER — Ambulatory Visit (INDEPENDENT_AMBULATORY_CARE_PROVIDER_SITE_OTHER): Payer: Medicare Other | Admitting: Podiatry

## 2019-05-21 DIAGNOSIS — M14671 Charcot's joint, right ankle and foot: Secondary | ICD-10-CM | POA: Diagnosis not present

## 2019-05-21 DIAGNOSIS — L97512 Non-pressure chronic ulcer of other part of right foot with fat layer exposed: Secondary | ICD-10-CM

## 2019-05-21 DIAGNOSIS — E1149 Type 2 diabetes mellitus with other diabetic neurological complication: Secondary | ICD-10-CM

## 2019-05-21 NOTE — Progress Notes (Signed)
Subjective: 86 year old male presents the office today for follow-up evaluation of a wound on the right foot.  Since the change in the dimensions wound care standpoint and has noticed a small bloody drainage but no pus.  No redness or swelling or any warmth.  No pain.Denies any systemic complaints such as fevers, chills, nausea, vomiting. No acute changes since last appointment, and no other complaints at this time.   Objective: AAO x3, NAD-presents with daughter DP/PT pulses palpable bilaterally, CRT less than 3 seconds Bony prominence present on the plantar medial aspect of the foot from chronic foot deformity.  This is resulted in a hyperkeratotic lesion there is an ulceration today with a granular wound base measuring 0.9 x 0.4 cm with a depth of 0.1.  No cellulitis.  There is no fluctuation crepitation there is no malodor.  No edema, erythema, increase in warmth to bilateral lower extremities.  No open lesions or pre-ulcerative lesions.  No pain with calf compression, swelling, warmth, erythema  Assessment: Ulceration right foot  Plan: -All treatment options discussed with the patient including all alternatives, risks, complications.  -Sharply debrided the wound today utilizing the 312 with scalpel any complications, healthy tissue.  I applied Medihoney today.  On the order wound care supplies to include collagen, silver through [facility] today.  -Offloading  -Monitor for any clinical signs or symptoms of infection and directed to call the office immediately should any occur or go to the ER. -Patient encouraged to call the office with any questions, concerns, change in symptoms.   RTC 2 weeks or sooner if needed  Trula Slade DPM

## 2019-05-31 DIAGNOSIS — H40023 Open angle with borderline findings, high risk, bilateral: Secondary | ICD-10-CM | POA: Diagnosis not present

## 2019-06-03 DIAGNOSIS — I10 Essential (primary) hypertension: Secondary | ICD-10-CM | POA: Diagnosis not present

## 2019-06-03 DIAGNOSIS — N390 Urinary tract infection, site not specified: Secondary | ICD-10-CM | POA: Diagnosis not present

## 2019-06-03 DIAGNOSIS — E119 Type 2 diabetes mellitus without complications: Secondary | ICD-10-CM | POA: Diagnosis not present

## 2019-06-04 ENCOUNTER — Ambulatory Visit (INDEPENDENT_AMBULATORY_CARE_PROVIDER_SITE_OTHER): Payer: Medicare Other | Admitting: Podiatry

## 2019-06-04 ENCOUNTER — Other Ambulatory Visit: Payer: Self-pay

## 2019-06-04 VITALS — Temp 97.7°F

## 2019-06-04 DIAGNOSIS — E1149 Type 2 diabetes mellitus with other diabetic neurological complication: Secondary | ICD-10-CM

## 2019-06-04 DIAGNOSIS — L97512 Non-pressure chronic ulcer of other part of right foot with fat layer exposed: Secondary | ICD-10-CM | POA: Diagnosis not present

## 2019-06-04 DIAGNOSIS — L03115 Cellulitis of right lower limb: Secondary | ICD-10-CM | POA: Diagnosis not present

## 2019-06-04 MED ORDER — CEPHALEXIN 500 MG PO CAPS: 500.0000 mg | ORAL_CAPSULE | Freq: Three times a day (TID) | ORAL | 0 refills | Status: DC

## 2019-06-10 DIAGNOSIS — N3000 Acute cystitis without hematuria: Secondary | ICD-10-CM | POA: Diagnosis not present

## 2019-06-10 DIAGNOSIS — I10 Essential (primary) hypertension: Secondary | ICD-10-CM | POA: Diagnosis not present

## 2019-06-10 DIAGNOSIS — D649 Anemia, unspecified: Secondary | ICD-10-CM | POA: Diagnosis not present

## 2019-06-10 DIAGNOSIS — Z79899 Other long term (current) drug therapy: Secondary | ICD-10-CM | POA: Diagnosis not present

## 2019-06-10 DIAGNOSIS — E1140 Type 2 diabetes mellitus with diabetic neuropathy, unspecified: Secondary | ICD-10-CM | POA: Diagnosis not present

## 2019-06-10 DIAGNOSIS — K7460 Unspecified cirrhosis of liver: Secondary | ICD-10-CM | POA: Diagnosis not present

## 2019-06-10 DIAGNOSIS — E782 Mixed hyperlipidemia: Secondary | ICD-10-CM | POA: Diagnosis not present

## 2019-06-10 DIAGNOSIS — F339 Major depressive disorder, recurrent, unspecified: Secondary | ICD-10-CM | POA: Diagnosis not present

## 2019-06-10 NOTE — Progress Notes (Signed)
Subjective: 86 year old male presents the office today for follow-up evaluation of a wound on the right foot.  He did recently see the new wound dressing through [facility] he started use this.  He states that overall the wound is doing better.  Denies any drainage or pus or any increase in swelling or redness. Denies any systemic complaints such as fevers, chills, nausea, vomiting. No acute changes since last appointment, and no other complaints at this time.   Objective: AAO x3, NAD-presents with daughter DP/PT pulses palpable bilaterally, CRT less than 3 seconds Bony prominence present on the plantar medial aspect of the foot from chronic foot deformity.  This is resulted in a hyperkeratotic lesion there is an ulceration today with a granular wound base measuring 0.8 x 0. X 0.1 cm.  There is mild erythema and faint amount of warmth on the area the erythema just along the superior portion of the wound.  There is no ascending cellulitis.  There is no fluctuation or crepitation.  There is no malodor.  No drainage or pus.   No open lesions or pre-ulcerative lesions.  No pain with calf compression, swelling, warmth, erythema  Assessment: Ulceration right foot, mild cellulitis   Plan: -All treatment options discussed with the patient including all alternatives, risks, complications.  -Sharply debrided the wound today utilizing the 312 with scalpel any complications, healthy tissue.  I applied Medihoney today.  Prescribed Keflex for the mild erythema.  Monitor closely for any signs or symptoms of worsening infection.  Continue daily dressing changes.  He can wash the wound daily with soap and water and dry thoroughly. Offloading at all times.   Return in about 2 weeks (around 06/18/2019) for wound check .  Trula Slade DPM

## 2019-06-14 ENCOUNTER — Encounter (HOSPITAL_COMMUNITY): Payer: Self-pay | Admitting: Emergency Medicine

## 2019-06-14 ENCOUNTER — Inpatient Hospital Stay (HOSPITAL_COMMUNITY)
Admission: EM | Admit: 2019-06-14 | Discharge: 2019-06-18 | DRG: 378 | Disposition: A | Discharge status: Home / Self Care | Payer: Medicare Other | Attending: Internal Medicine | Admitting: Internal Medicine

## 2019-06-14 ENCOUNTER — Telehealth: Payer: Self-pay | Admitting: *Deleted

## 2019-06-14 ENCOUNTER — Other Ambulatory Visit: Payer: Self-pay

## 2019-06-14 DIAGNOSIS — K922 Gastrointestinal hemorrhage, unspecified: Secondary | ICD-10-CM | POA: Diagnosis not present

## 2019-06-14 DIAGNOSIS — D62 Acute posthemorrhagic anemia: Secondary | ICD-10-CM | POA: Diagnosis present

## 2019-06-14 DIAGNOSIS — I482 Chronic atrial fibrillation, unspecified: Secondary | ICD-10-CM | POA: Diagnosis present

## 2019-06-14 DIAGNOSIS — N179 Acute kidney failure, unspecified: Secondary | ICD-10-CM | POA: Diagnosis present

## 2019-06-14 DIAGNOSIS — L97509 Non-pressure chronic ulcer of other part of unspecified foot with unspecified severity: Secondary | ICD-10-CM | POA: Diagnosis not present

## 2019-06-14 DIAGNOSIS — C799 Secondary malignant neoplasm of unspecified site: Secondary | ICD-10-CM | POA: Diagnosis present

## 2019-06-14 DIAGNOSIS — N390 Urinary tract infection, site not specified: Secondary | ICD-10-CM | POA: Diagnosis present

## 2019-06-14 DIAGNOSIS — E11610 Type 2 diabetes mellitus with diabetic neuropathic arthropathy: Secondary | ICD-10-CM

## 2019-06-14 DIAGNOSIS — D649 Anemia, unspecified: Secondary | ICD-10-CM | POA: Diagnosis present

## 2019-06-14 DIAGNOSIS — I2510 Atherosclerotic heart disease of native coronary artery without angina pectoris: Secondary | ICD-10-CM | POA: Diagnosis present

## 2019-06-14 DIAGNOSIS — H9190 Unspecified hearing loss, unspecified ear: Secondary | ICD-10-CM | POA: Diagnosis present

## 2019-06-14 DIAGNOSIS — I480 Paroxysmal atrial fibrillation: Secondary | ICD-10-CM | POA: Diagnosis not present

## 2019-06-14 DIAGNOSIS — Z8249 Family history of ischemic heart disease and other diseases of the circulatory system: Secondary | ICD-10-CM

## 2019-06-14 DIAGNOSIS — Z85828 Personal history of other malignant neoplasm of skin: Secondary | ICD-10-CM

## 2019-06-14 DIAGNOSIS — Z9079 Acquired absence of other genital organ(s): Secondary | ICD-10-CM

## 2019-06-14 DIAGNOSIS — G4733 Obstructive sleep apnea (adult) (pediatric): Secondary | ICD-10-CM | POA: Diagnosis present

## 2019-06-14 DIAGNOSIS — E876 Hypokalemia: Secondary | ICD-10-CM | POA: Diagnosis not present

## 2019-06-14 DIAGNOSIS — Z8546 Personal history of malignant neoplasm of prostate: Secondary | ICD-10-CM

## 2019-06-14 DIAGNOSIS — Z87442 Personal history of urinary calculi: Secondary | ICD-10-CM

## 2019-06-14 DIAGNOSIS — Z8616 Personal history of COVID-19: Secondary | ICD-10-CM

## 2019-06-14 DIAGNOSIS — I9581 Postprocedural hypotension: Secondary | ICD-10-CM | POA: Diagnosis not present

## 2019-06-14 DIAGNOSIS — Z951 Presence of aortocoronary bypass graft: Secondary | ICD-10-CM

## 2019-06-14 DIAGNOSIS — E785 Hyperlipidemia, unspecified: Secondary | ICD-10-CM | POA: Diagnosis present

## 2019-06-14 DIAGNOSIS — Z794 Long term (current) use of insulin: Secondary | ICD-10-CM

## 2019-06-14 DIAGNOSIS — I10 Essential (primary) hypertension: Secondary | ICD-10-CM | POA: Diagnosis not present

## 2019-06-14 DIAGNOSIS — I129 Hypertensive chronic kidney disease with stage 1 through stage 4 chronic kidney disease, or unspecified chronic kidney disease: Secondary | ICD-10-CM | POA: Diagnosis present

## 2019-06-14 DIAGNOSIS — Z7901 Long term (current) use of anticoagulants: Secondary | ICD-10-CM

## 2019-06-14 DIAGNOSIS — E1140 Type 2 diabetes mellitus with diabetic neuropathy, unspecified: Secondary | ICD-10-CM | POA: Diagnosis present

## 2019-06-14 DIAGNOSIS — C8590 Non-Hodgkin lymphoma, unspecified, unspecified site: Secondary | ICD-10-CM | POA: Diagnosis present

## 2019-06-14 DIAGNOSIS — M109 Gout, unspecified: Secondary | ICD-10-CM | POA: Diagnosis present

## 2019-06-14 DIAGNOSIS — E11621 Type 2 diabetes mellitus with foot ulcer: Secondary | ICD-10-CM

## 2019-06-14 DIAGNOSIS — Z87891 Personal history of nicotine dependence: Secondary | ICD-10-CM

## 2019-06-14 DIAGNOSIS — K921 Melena: Secondary | ICD-10-CM | POA: Diagnosis not present

## 2019-06-14 DIAGNOSIS — E1122 Type 2 diabetes mellitus with diabetic chronic kidney disease: Secondary | ICD-10-CM | POA: Diagnosis present

## 2019-06-14 DIAGNOSIS — N1831 Chronic kidney disease, stage 3a: Secondary | ICD-10-CM | POA: Diagnosis present

## 2019-06-14 DIAGNOSIS — R531 Weakness: Secondary | ICD-10-CM | POA: Diagnosis not present

## 2019-06-14 DIAGNOSIS — Z66 Do not resuscitate: Secondary | ICD-10-CM | POA: Diagnosis not present

## 2019-06-14 DIAGNOSIS — K5750 Diverticulosis of both small and large intestine without perforation or abscess without bleeding: Secondary | ICD-10-CM | POA: Diagnosis present

## 2019-06-14 DIAGNOSIS — J45909 Unspecified asthma, uncomplicated: Secondary | ICD-10-CM | POA: Diagnosis present

## 2019-06-14 DIAGNOSIS — D509 Iron deficiency anemia, unspecified: Secondary | ICD-10-CM | POA: Diagnosis not present

## 2019-06-14 DIAGNOSIS — E1142 Type 2 diabetes mellitus with diabetic polyneuropathy: Secondary | ICD-10-CM | POA: Diagnosis present

## 2019-06-14 DIAGNOSIS — R195 Other fecal abnormalities: Secondary | ICD-10-CM | POA: Diagnosis not present

## 2019-06-14 DIAGNOSIS — Z79899 Other long term (current) drug therapy: Secondary | ICD-10-CM

## 2019-06-14 DIAGNOSIS — M1990 Unspecified osteoarthritis, unspecified site: Secondary | ICD-10-CM | POA: Diagnosis present

## 2019-06-14 DIAGNOSIS — Z8505 Personal history of malignant neoplasm of liver: Secondary | ICD-10-CM

## 2019-06-14 DIAGNOSIS — C61 Malignant neoplasm of prostate: Secondary | ICD-10-CM | POA: Diagnosis present

## 2019-06-14 DIAGNOSIS — E11649 Type 2 diabetes mellitus with hypoglycemia without coma: Secondary | ICD-10-CM | POA: Diagnosis present

## 2019-06-14 LAB — CBC WITH DIFFERENTIAL/PLATELET
Abs Immature Granulocytes: 0.46 10*3/uL — ABNORMAL HIGH (ref 0.00–0.07)
Basophils Absolute: 0 10*3/uL (ref 0.0–0.1)
Basophils Relative: 0 %
Eosinophils Absolute: 0 10*3/uL (ref 0.0–0.5)
Eosinophils Relative: 1 %
HCT: 22.9 % — ABNORMAL LOW (ref 39.0–52.0)
Hemoglobin: 7 g/dL — ABNORMAL LOW (ref 13.0–17.0)
Immature Granulocytes: 7 %
Lymphocytes Relative: 3 %
Lymphs Abs: 0.2 10*3/uL — ABNORMAL LOW (ref 0.7–4.0)
MCH: 25.5 pg — ABNORMAL LOW (ref 26.0–34.0)
MCHC: 30.6 g/dL (ref 30.0–36.0)
MCV: 83.6 fL (ref 80.0–100.0)
Monocytes Absolute: 0.4 10*3/uL (ref 0.1–1.0)
Monocytes Relative: 5 %
Neutro Abs: 5.5 10*3/uL (ref 1.7–7.7)
Neutrophils Relative %: 84 %
Platelets: 165 10*3/uL (ref 150–400)
RBC: 2.74 MIL/uL — ABNORMAL LOW (ref 4.22–5.81)
RDW: 18.5 % — ABNORMAL HIGH (ref 11.5–15.5)
WBC: 6.5 10*3/uL (ref 4.0–10.5)
nRBC: 0 % (ref 0.0–0.2)

## 2019-06-14 LAB — GLUCOSE, CAPILLARY
Glucose-Capillary: 87 mg/dL (ref 70–99)
Glucose-Capillary: 88 mg/dL (ref 70–99)

## 2019-06-14 LAB — COMPREHENSIVE METABOLIC PANEL
ALT: 30 U/L (ref 0–44)
AST: 60 U/L — ABNORMAL HIGH (ref 15–41)
Albumin: 3 g/dL — ABNORMAL LOW (ref 3.5–5.0)
Alkaline Phosphatase: 45 U/L (ref 38–126)
Anion gap: 10 (ref 5–15)
BUN: 27 mg/dL — ABNORMAL HIGH (ref 8–23)
CO2: 22 mmol/L (ref 22–32)
Calcium: 8.6 mg/dL — ABNORMAL LOW (ref 8.9–10.3)
Chloride: 104 mmol/L (ref 98–111)
Creatinine, Ser: 1.39 mg/dL — ABNORMAL HIGH (ref 0.61–1.24)
GFR calc Af Amer: 53 mL/min — ABNORMAL LOW (ref >60)
GFR calc non Af Amer: 46 mL/min — ABNORMAL LOW (ref >60)
Glucose, Bld: 167 mg/dL — ABNORMAL HIGH (ref 70–99)
Potassium: 3.5 mmol/L (ref 3.5–5.1)
Sodium: 136 mmol/L (ref 135–145)
Total Bilirubin: 0.4 mg/dL (ref 0.3–1.2)
Total Protein: 5.9 g/dL — ABNORMAL LOW (ref 6.5–8.1)

## 2019-06-14 LAB — PREPARE RBC (CROSSMATCH)

## 2019-06-14 LAB — PROTIME-INR
INR: 1.2 (ref 0.8–1.2)
Prothrombin Time: 14.7 seconds (ref 11.4–15.2)

## 2019-06-14 LAB — HEMOGLOBIN AND HEMATOCRIT, BLOOD
HCT: 24.6 % — ABNORMAL LOW (ref 39.0–52.0)
Hemoglobin: 7.8 g/dL — ABNORMAL LOW (ref 13.0–17.0)

## 2019-06-14 LAB — POC OCCULT BLOOD, ED: Fecal Occult Bld: POSITIVE — AB

## 2019-06-14 LAB — MRSA PCR SCREENING: MRSA by PCR: NEGATIVE

## 2019-06-14 MED ORDER — ACETAMINOPHEN 325 MG PO TABS: 650.0000 mg | ORAL_TABLET | Freq: Four times a day (QID) | ORAL | Status: DC | PRN

## 2019-06-14 MED ORDER — LORATADINE 10 MG PO TABS
10.0000 mg | ORAL_TABLET | Freq: Every day | ORAL | Status: DC
  Administered 2019-06-15 – 2019-06-18 (×4): 10 mg via ORAL
  Filled 2019-06-14 (×4): qty 1

## 2019-06-14 MED ORDER — GABAPENTIN 300 MG PO CAPS
300.0000 mg | ORAL_CAPSULE | Freq: Three times a day (TID) | ORAL | Status: DC
  Administered 2019-06-14 – 2019-06-18 (×11): 300 mg via ORAL
  Filled 2019-06-14 (×11): qty 1

## 2019-06-14 MED ORDER — SODIUM CHLORIDE 0.9 % IV SOLN
8.0000 mg/h | INTRAVENOUS | Status: DC
  Administered 2019-06-14 – 2019-06-15 (×3): 8 mg/h via INTRAVENOUS
  Filled 2019-06-14 (×4): qty 80

## 2019-06-14 MED ORDER — POLYETHYLENE GLYCOL 3350 17 GM/SCOOP PO POWD: 17.0000 g | Freq: Two times a day (BID) | ORAL | Status: DC | PRN

## 2019-06-14 MED ORDER — CHLORHEXIDINE GLUCONATE CLOTH 2 % EX PADS
6.0000 | MEDICATED_PAD | Freq: Every day | CUTANEOUS | Status: DC
  Administered 2019-06-14 – 2019-06-18 (×5): 6 via TOPICAL

## 2019-06-14 MED ORDER — SERTRALINE HCL 100 MG PO TABS
100.0000 mg | ORAL_TABLET | Freq: Every day | ORAL | Status: DC
  Administered 2019-06-15 – 2019-06-18 (×4): 100 mg via ORAL
  Filled 2019-06-14 (×4): qty 1

## 2019-06-14 MED ORDER — PANCRELIPASE (LIP-PROT-AMYL) 12000-38000 UNITS PO CPEP
12000.0000 [IU] | ORAL_CAPSULE | Freq: Three times a day (TID) | ORAL | Status: DC
  Administered 2019-06-15 – 2019-06-18 (×8): 12000 [IU] via ORAL
  Filled 2019-06-14 (×12): qty 1

## 2019-06-14 MED ORDER — FENOFIBRATE 160 MG PO TABS
160.0000 mg | ORAL_TABLET | Freq: Every evening | ORAL | Status: DC
  Administered 2019-06-14 – 2019-06-17 (×4): 160 mg via ORAL
  Filled 2019-06-14 (×7): qty 1

## 2019-06-14 MED ORDER — POLYETHYLENE GLYCOL 3350 17 G PO PACK: 17.0000 g | PACK | Freq: Two times a day (BID) | ORAL | Status: DC | PRN

## 2019-06-14 MED ORDER — SODIUM CHLORIDE 0.9 % IV SOLN
80.0000 mg | Freq: Once | INTRAVENOUS | Status: AC
  Administered 2019-06-14: 80 mg via INTRAVENOUS
  Filled 2019-06-14: qty 80

## 2019-06-14 MED ORDER — INSULIN GLARGINE 100 UNIT/ML ~~LOC~~ SOLN
10.0000 [IU] | Freq: Every day | SUBCUTANEOUS | Status: DC
  Administered 2019-06-15 – 2019-06-18 (×4): 10 [IU] via SUBCUTANEOUS
  Filled 2019-06-14 (×4): qty 0.1

## 2019-06-14 MED ORDER — SODIUM CHLORIDE 0.9 % IV BOLUS: 500.0000 mL | Freq: Once | INTRAVENOUS | Status: DC

## 2019-06-14 MED ORDER — MAGNESIUM OXIDE 400 (241.3 MG) MG PO TABS
400.0000 mg | ORAL_TABLET | Freq: Two times a day (BID) | ORAL | Status: DC
  Administered 2019-06-14 – 2019-06-18 (×8): 400 mg via ORAL
  Filled 2019-06-14 (×10): qty 1

## 2019-06-14 MED ORDER — ONDANSETRON HCL 4 MG/2ML IJ SOLN: 4.0000 mg | Freq: Four times a day (QID) | INTRAMUSCULAR | Status: DC | PRN

## 2019-06-14 MED ORDER — SODIUM CHLORIDE 0.9 % IV BOLUS
250.0000 mL | Freq: Once | INTRAVENOUS | Status: AC
  Administered 2019-06-14: 250 mL via INTRAVENOUS

## 2019-06-14 MED ORDER — SODIUM CHLORIDE 0.9 % IV SOLN: INTRAVENOUS | Status: DC

## 2019-06-14 MED ORDER — ALLOPURINOL 100 MG PO TABS
50.0000 mg | ORAL_TABLET | Freq: Every day | ORAL | Status: DC
  Administered 2019-06-15 – 2019-06-18 (×4): 50 mg via ORAL
  Filled 2019-06-14 (×4): qty 1

## 2019-06-14 MED ORDER — ATORVASTATIN CALCIUM 20 MG PO TABS
20.0000 mg | ORAL_TABLET | Freq: Every evening | ORAL | Status: DC
  Administered 2019-06-14 – 2019-06-17 (×4): 20 mg via ORAL
  Filled 2019-06-14 (×4): qty 2

## 2019-06-14 MED ORDER — ONDANSETRON HCL 4 MG PO TABS: 4.0000 mg | ORAL_TABLET | Freq: Four times a day (QID) | ORAL | Status: DC | PRN

## 2019-06-14 MED ORDER — SODIUM CHLORIDE 0.9 % IV SOLN: 10.0000 mL/h | Freq: Once | INTRAVENOUS | Status: DC

## 2019-06-14 MED ORDER — ACETAMINOPHEN 650 MG RE SUPP: 650.0000 mg | Freq: Four times a day (QID) | RECTAL | Status: DC | PRN

## 2019-06-14 NOTE — ED Notes (Signed)
POC occult was positive
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
## 2019-06-14 NOTE — Telephone Encounter (Signed)
This RN received VM left at 1110 from the pt's daughter- [name] - stating pt is having dark stools- and she was informed by her sisters that his hemoglobin was 8.5 last week   " beside his usual level of low energy and getting tired easy he is not in any distress "  She has placed a call to Dr Shelia Media and am waiting to hear back - but wasn't sure what direction to go and if I needed to bring him up there to your office "  Return call number given as [phone].  Post retrieving call and opening chart- noted pt went to the ER at 1244.  This note will be forwarded to MD for review and update of pt's situation.
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
## 2019-06-14 NOTE — ED Triage Notes (Addendum)
Patient BIB daughter, reports patient has c/o weakness and two dark stools since yesterday. Hx anemia and transfusion during Covid admission. Patient is hard of hearing.

## 2019-06-14 NOTE — ED Provider Notes (Signed)
Medical screening examination/treatment/procedure(s) were conducted as a shared visit with non-physician practitioner(s) and myself.  I personally evaluated the patient during the encounter.    86 year old male presents with dark stools.  Patient is on Xarelto.  Hemoglobin here is 7.  Will order blood transfusion consult GI admit to medicine service   Lacretia Leigh, MD 06/14/19 [phone]

## 2019-06-14 NOTE — ED Notes (Signed)
Blood bank has a unit of blood ready for this patient. Notified Ngozi,RN.

## 2019-06-15 ENCOUNTER — Observation Stay (HOSPITAL_COMMUNITY): Payer: Medicare Other | Admitting: Anesthesiology

## 2019-06-15 ENCOUNTER — Encounter (HOSPITAL_COMMUNITY)
Admission: EM | Disposition: A | Discharge status: Home / Self Care | Payer: Self-pay | Source: Home / Self Care | Attending: Internal Medicine

## 2019-06-15 ENCOUNTER — Encounter (HOSPITAL_COMMUNITY): Payer: Self-pay | Admitting: Family Medicine

## 2019-06-15 DIAGNOSIS — E785 Hyperlipidemia, unspecified: Secondary | ICD-10-CM | POA: Diagnosis present

## 2019-06-15 DIAGNOSIS — E876 Hypokalemia: Secondary | ICD-10-CM

## 2019-06-15 DIAGNOSIS — N390 Urinary tract infection, site not specified: Secondary | ICD-10-CM | POA: Diagnosis not present

## 2019-06-15 DIAGNOSIS — I480 Paroxysmal atrial fibrillation: Secondary | ICD-10-CM | POA: Diagnosis present

## 2019-06-15 DIAGNOSIS — D509 Iron deficiency anemia, unspecified: Secondary | ICD-10-CM | POA: Diagnosis not present

## 2019-06-15 DIAGNOSIS — I129 Hypertensive chronic kidney disease with stage 1 through stage 4 chronic kidney disease, or unspecified chronic kidney disease: Secondary | ICD-10-CM | POA: Diagnosis not present

## 2019-06-15 DIAGNOSIS — N179 Acute kidney failure, unspecified: Secondary | ICD-10-CM

## 2019-06-15 DIAGNOSIS — E1122 Type 2 diabetes mellitus with diabetic chronic kidney disease: Secondary | ICD-10-CM | POA: Diagnosis not present

## 2019-06-15 DIAGNOSIS — D649 Anemia, unspecified: Secondary | ICD-10-CM | POA: Diagnosis not present

## 2019-06-15 DIAGNOSIS — R195 Other fecal abnormalities: Secondary | ICD-10-CM | POA: Diagnosis not present

## 2019-06-15 DIAGNOSIS — Z8616 Personal history of COVID-19: Secondary | ICD-10-CM | POA: Diagnosis not present

## 2019-06-15 DIAGNOSIS — Z66 Do not resuscitate: Secondary | ICD-10-CM | POA: Diagnosis not present

## 2019-06-15 DIAGNOSIS — I951 Orthostatic hypotension: Secondary | ICD-10-CM | POA: Diagnosis not present

## 2019-06-15 DIAGNOSIS — D62 Acute posthemorrhagic anemia: Secondary | ICD-10-CM

## 2019-06-15 DIAGNOSIS — U071 COVID-19: Secondary | ICD-10-CM | POA: Diagnosis not present

## 2019-06-15 DIAGNOSIS — K922 Gastrointestinal hemorrhage, unspecified: Secondary | ICD-10-CM | POA: Diagnosis not present

## 2019-06-15 DIAGNOSIS — C8590 Non-Hodgkin lymphoma, unspecified, unspecified site: Secondary | ICD-10-CM | POA: Diagnosis present

## 2019-06-15 DIAGNOSIS — I482 Chronic atrial fibrillation, unspecified: Secondary | ICD-10-CM | POA: Diagnosis present

## 2019-06-15 DIAGNOSIS — C799 Secondary malignant neoplasm of unspecified site: Secondary | ICD-10-CM | POA: Diagnosis present

## 2019-06-15 DIAGNOSIS — I9581 Postprocedural hypotension: Secondary | ICD-10-CM | POA: Diagnosis not present

## 2019-06-15 DIAGNOSIS — L97509 Non-pressure chronic ulcer of other part of unspecified foot with unspecified severity: Secondary | ICD-10-CM | POA: Diagnosis present

## 2019-06-15 DIAGNOSIS — J1282 Pneumonia due to coronavirus disease 2019: Secondary | ICD-10-CM | POA: Diagnosis not present

## 2019-06-15 DIAGNOSIS — K5730 Diverticulosis of large intestine without perforation or abscess without bleeding: Secondary | ICD-10-CM | POA: Diagnosis not present

## 2019-06-15 DIAGNOSIS — K5710 Diverticulosis of small intestine without perforation or abscess without bleeding: Secondary | ICD-10-CM | POA: Diagnosis not present

## 2019-06-15 DIAGNOSIS — E11621 Type 2 diabetes mellitus with foot ulcer: Secondary | ICD-10-CM | POA: Diagnosis present

## 2019-06-15 DIAGNOSIS — E11610 Type 2 diabetes mellitus with diabetic neuropathic arthropathy: Secondary | ICD-10-CM | POA: Diagnosis present

## 2019-06-15 DIAGNOSIS — R531 Weakness: Secondary | ICD-10-CM | POA: Diagnosis not present

## 2019-06-15 DIAGNOSIS — K921 Melena: Secondary | ICD-10-CM | POA: Diagnosis present

## 2019-06-15 DIAGNOSIS — Z794 Long term (current) use of insulin: Secondary | ICD-10-CM | POA: Diagnosis not present

## 2019-06-15 DIAGNOSIS — I2510 Atherosclerotic heart disease of native coronary artery without angina pectoris: Secondary | ICD-10-CM | POA: Diagnosis present

## 2019-06-15 DIAGNOSIS — N1831 Chronic kidney disease, stage 3a: Secondary | ICD-10-CM | POA: Diagnosis not present

## 2019-06-15 DIAGNOSIS — E11649 Type 2 diabetes mellitus with hypoglycemia without coma: Secondary | ICD-10-CM | POA: Diagnosis not present

## 2019-06-15 DIAGNOSIS — K5750 Diverticulosis of both small and large intestine without perforation or abscess without bleeding: Secondary | ICD-10-CM | POA: Diagnosis present

## 2019-06-15 HISTORY — PX: ESOPHAGOGASTRODUODENOSCOPY (EGD) WITH PROPOFOL: SHX5813

## 2019-06-15 LAB — BPAM RBC
Blood Product Expiration Date: 202103222359
ISSUE DATE / TIME: 202102191704
Unit Type and Rh: 6200

## 2019-06-15 LAB — TYPE AND SCREEN
ABO/RH(D): A POS
Antibody Screen: NEGATIVE
Unit division: 0

## 2019-06-15 LAB — CBC
HCT: 23.8 % — ABNORMAL LOW (ref 39.0–52.0)
Hemoglobin: 7.4 g/dL — ABNORMAL LOW (ref 13.0–17.0)
MCH: 26.2 pg (ref 26.0–34.0)
MCHC: 31.1 g/dL (ref 30.0–36.0)
MCV: 84.4 fL (ref 80.0–100.0)
Platelets: 147 10*3/uL — ABNORMAL LOW (ref 150–400)
RBC: 2.82 MIL/uL — ABNORMAL LOW (ref 4.22–5.81)
RDW: 18 % — ABNORMAL HIGH (ref 11.5–15.5)
WBC: 6.4 10*3/uL (ref 4.0–10.5)
nRBC: 0 % (ref 0.0–0.2)

## 2019-06-15 LAB — URINALYSIS, ROUTINE W REFLEX MICROSCOPIC
Bilirubin Urine: NEGATIVE
Glucose, UA: 50 mg/dL — AB
Hgb urine dipstick: NEGATIVE
Ketones, ur: NEGATIVE mg/dL
Leukocytes,Ua: NEGATIVE
Nitrite: NEGATIVE
Protein, ur: 30 mg/dL — AB
Specific Gravity, Urine: 1.011 (ref 1.005–1.030)
pH: 6 (ref 5.0–8.0)

## 2019-06-15 LAB — GLUCOSE, CAPILLARY
Glucose-Capillary: 84 mg/dL (ref 70–99)
Glucose-Capillary: 84 mg/dL (ref 70–99)
Glucose-Capillary: 100 mg/dL — ABNORMAL HIGH (ref 70–99)
Glucose-Capillary: 75 mg/dL (ref 70–99)
Glucose-Capillary: 138 mg/dL — ABNORMAL HIGH (ref 70–99)
Glucose-Capillary: 92 mg/dL (ref 70–99)

## 2019-06-15 LAB — BASIC METABOLIC PANEL
Anion gap: 9 (ref 5–15)
BUN: 20 mg/dL (ref 8–23)
CO2: 21 mmol/L — ABNORMAL LOW (ref 22–32)
Calcium: 8.1 mg/dL — ABNORMAL LOW (ref 8.9–10.3)
Chloride: 108 mmol/L (ref 98–111)
Creatinine, Ser: 1.16 mg/dL (ref 0.61–1.24)
GFR calc Af Amer: >60 mL/min (ref >60)
GFR calc non Af Amer: 57 mL/min — ABNORMAL LOW (ref >60)
Glucose, Bld: 88 mg/dL (ref 70–99)
Potassium: 3.3 mmol/L — ABNORMAL LOW (ref 3.5–5.1)
Sodium: 138 mmol/L (ref 135–145)

## 2019-06-15 SURGERY — ESOPHAGOGASTRODUODENOSCOPY (EGD) WITH PROPOFOL
Anesthesia: Monitor Anesthesia Care

## 2019-06-15 SURGERY — CANCELLED PROCEDURE

## 2019-06-15 MED ORDER — PEG 3350-KCL-NA BICARB-NACL 420 G PO SOLR
4000.0000 mL | Freq: Once | ORAL | Status: AC
  Administered 2019-06-15: 4000 mL via ORAL
  Filled 2019-06-15: qty 4000

## 2019-06-15 MED ORDER — POTASSIUM CHLORIDE CRYS ER 20 MEQ PO TBCR
40.0000 meq | EXTENDED_RELEASE_TABLET | Freq: Once | ORAL | Status: AC
  Administered 2019-06-15: 40 meq via ORAL
  Filled 2019-06-15: qty 2

## 2019-06-15 MED ORDER — PANTOPRAZOLE SODIUM 40 MG PO TBEC
40.0000 mg | DELAYED_RELEASE_TABLET | Freq: Every day | ORAL | Status: DC
  Administered 2019-06-15 – 2019-06-18 (×4): 40 mg via ORAL
  Filled 2019-06-15 (×4): qty 1

## 2019-06-15 MED ORDER — LACTATED RINGERS IV SOLN: INTRAVENOUS | Status: DC | PRN

## 2019-06-15 MED ORDER — POTASSIUM CHLORIDE 10 MEQ/100ML IV SOLN
10.0000 meq | INTRAVENOUS | Status: AC
  Administered 2019-06-15: 07:00:00 10 meq via INTRAVENOUS
  Filled 2019-06-15 (×2): qty 100

## 2019-06-15 MED ORDER — PROPOFOL 500 MG/50ML IV EMUL
INTRAVENOUS | Status: DC | PRN
  Administered 2019-06-15: 100 ug/kg/min via INTRAVENOUS

## 2019-06-15 MED ORDER — PEG 3350-KCL-NA BICARB-NACL 420 G PO SOLR: 4000.0000 mL | Freq: Once | ORAL | Status: DC

## 2019-06-15 MED ORDER — SODIUM CHLORIDE 0.9 % IV SOLN: INTRAVENOUS | Status: DC

## 2019-06-15 MED ORDER — PROPOFOL 500 MG/50ML IV EMUL
INTRAVENOUS | Status: DC | PRN
  Administered 2019-06-15: 25 mg via INTRAVENOUS

## 2019-06-15 SURGICAL SUPPLY — 15 items

## 2019-06-15 NOTE — Anesthesia Preprocedure Evaluation (Addendum)
Anesthesia Evaluation  Patient identified by MRN, date of birth, ID band Patient awake    Reviewed: Allergy & Precautions, NPO status , Patient's Chart, lab work & pertinent test results  Airway Mallampati: III  TM Distance: >3 FB Neck ROM: Full    Dental  (+) Edentulous Upper, Missing   Pulmonary asthma , sleep apnea and Continuous Positive Airway Pressure Ventilation ,    Pulmonary exam normal breath sounds clear to auscultation       Cardiovascular hypertension, + CAD  Normal cardiovascular exam+ dysrhythmias Atrial Fibrillation  Rhythm:Regular Rate:Normal  ECG: SR, rate 72   Neuro/Psych PSYCHIATRIC DISORDERS negative neurological ROS     GI/Hepatic Neg liver ROS, Bowel prep,GERD  ,  Endo/Other  diabetes, Oral Hypoglycemic Agents  Renal/GU negative Renal ROS     Musculoskeletal Gout   Abdominal   Peds  Hematology  (+) anemia , HLD   Anesthesia Other Findings Melena  Reproductive/Obstetrics                            Anesthesia Physical Anesthesia Plan  ASA: III  Anesthesia Plan: MAC   Post-op Pain Management:    Induction: Intravenous  PONV Risk Score and Plan: 1 and Propofol infusion and Treatment may vary due to age or medical condition  Airway Management Planned: Simple Face Mask  Additional Equipment:   Intra-op Plan:   Post-operative Plan:   Informed Consent: I have reviewed the patients History and Physical, chart, labs and discussed the procedure including the risks, benefits and alternatives for the proposed anesthesia with the patient or authorized representative who has indicated his/her understanding and acceptance.     Dental advisory given  Plan Discussed with: CRNA  Anesthesia Plan Comments:        Anesthesia Quick Evaluation

## 2019-06-15 NOTE — Progress Notes (Signed)
RN received informed consent for colonoscopy tomorrow morning. Placed in pt. Chart.

## 2019-06-15 NOTE — Anesthesia Postprocedure Evaluation (Signed)
Anesthesia Post Note  Patient: [name]  Procedure(s) Performed: ESOPHAGOGASTRODUODENOSCOPY (EGD) WITH PROPOFOL (N/A )     Patient location during evaluation: Endoscopy Anesthesia Type: MAC Level of consciousness: awake and alert Pain management: pain level controlled Vital Signs Assessment: post-procedure vital signs reviewed and stable Respiratory status: spontaneous breathing, nonlabored ventilation and respiratory function stable Cardiovascular status: blood pressure returned to baseline and stable Postop Assessment: no apparent nausea or vomiting Anesthetic complications: no    Last Vitals:  Vitals:   06/15/19 0810 06/15/19 0818  BP: (!) 92/43 (!) 100/42  Pulse: 66 63  Resp: 17 15  Temp:    SpO2: 95% 95%    Last Pain:  Vitals:   06/15/19 0818  TempSrc:   PainSc: 0-No pain                 Lynda Rainwater

## 2019-06-15 NOTE — Progress Notes (Signed)
PROGRESS NOTE    [name]  JOA:[id number] DOB: 1932-01-30 DOA: 06/14/2019 PCP: [name], MD  Outpatient Specialists:   Brief Narrative:  Patient is an 86 year old Caucasian male with past medical history significant for atrial fibrillation on anticoagulation, CAD s/p CABG, CKD stage III, stage IV prostate cancer, non-hodgkin's lymphoma, diabetes mellitus, type II with neuropathy on insulin.  Patient presents for a with history of dark stools per rectum 2 days ago.  Patient underwent EGD that was nonrevealing.  GI is considering possible colonoscopy.  Hemoglobin on presentation was 7 g/dL, down from 9.2 g/dL.  Patient has been transfused 1 unit of packed red blood cells.  Hemoglobin today 7.4 g/dL.  Potassium 3.3.  Vital signs have remained stable.  No further bleeding reported.  No chest pain, no shortness of breath, no fatigue, no headache, no neck pain no other constitutional symptoms.  Further management will depend on hospital course.  Assessment & Plan:   Active Problems:   Symptomatic anemia  Acute blood loss anemia Memialena Acute on chronic anemia Baseline hemoglobin between 9-10. Likely upper GI source of bleed. Patient has not seen a gastroenterologist in at least 6 years after being cleared from needing to perform colonoscopies. Patient has required multiple transfusions in the past for multiple reasons, most recently relating to COVID-19 infection.  -EGD came back normal.   -GI team is directing care.   -Patient has been transfused with 1 unit of PRBC.  -Discontinue Protonix drip. -Start Protonix 40 mg p.o. once daily. -GI is considering discussing possible colonoscopy with patient.  Severity of illness: We will change patient's status from observation to inpatient.  Patient is an 86 year old male on anticoagulation due to chronic atrial fibrillation.  Patient has had significant GI bleed.  Hemoglobin has dropped from 9.2 g/dL to 7 g/dL.  EGD is nonrevealing.   Patient may have lower GI bleed, likely diverticular bleed.  Patient remains at risk of rebleeding.  We will continue to monitor patient for now.  We will need to monitor patient's H&H.  Patient also has electrolyte abnormalities.  Acute coronary syndrome is known to complicate GI bleed at this age.  Hypokalemia: -K-Dur 40 M EQ p.o. once. -Renal panel in the morning -Continue to monitor electrolytes and renal function.  Acute kidney injury:  -This likely prerenal.   -AKI slowly resolving.   -Serum creatinine has dropped from 1.39 to 1.16.   -Gentle hydration.    Diabetic foot ulcer Patient follows with podiatry and has been receiving x-rays to rule out osteomyelitis. Currently does not appear infected. He has recently been treated for a cellulitis. Complicated by charcot foot.  Paroxysmal atrial fibrillation Patient was on Xarelto as an outpatient. Not on rate control medications -Hold Xarelto secondary to GI bleed  Non-hodgkin's lymphoma Stage IV prostate cancer Patient is currently on maintenance rituximab for NHL and follows with Dr. Jana Hakim as an outpatient. He follows with Urology, Dr. [name] for prostate cancer and is s/p prostatectomy in addition to stenting for obstructive uropathy secondary to bladder mass  Diabetes mellitus, type 2 Diabetic neuropathy Patient is on metformin, Toujeo as an outpatient -SSI -Renally dose gabapentin (no current changes) 06/15/2019: Continue subcutaneous Lantus.  Monitor blood sugar every 6 hours.  Change AC at bedtime when patient resumes diet.  Possible UTI Per daughter, patient is being treated with nitrofurantoin. Unknown organism/sensitivities. Patient reports no symptoms but daughter states he generally does not report issues. Kidney function does not support safe usage of nitrofurantoin at  this time -Discontinue nitrofurantoin -Watch for urinary symptoms 06/15/2019: Check urinalysis and urine culture  CAD s/p  CABG Hyperlipidemia Noted on history -Continue Lipitor and fenofibrate  Gout -Renally dose allopurinol  OSA Not using CPAP  DVT prophylaxis: SCD Code Status: DO NOT RESUSCITATE Family Communication:  Disposition Plan: Home eventually   Consultants:   GI  Procedures:   EGD  Antimicrobials:   None   Subjective: No new complaints No further bleeding reported No chest pain no shortness of breath  Objective: Vitals:   06/15/19 0758 06/15/19 0810 06/15/19 0818 06/15/19 0900  BP: (!) 96/38 (!) 92/43 (!) 100/42   Pulse: 64 66 63 65  Resp: 18 17 15 19   Temp: 98.8 F (37.1 C)   98.6 F (37 C)  TempSrc: Oral   Oral  SpO2: 100% 95% 95% 96%  Weight:      Height:        Intake/Output Summary (Last 24 hours) at 06/15/2019 0931 Last data filed at 06/15/2019 0751 Gross per 24 hour  Intake 2010.49 ml  Output 860 ml  Net 1150.49 ml   Filed Weights   06/14/19 2000 06/15/19 0721  Weight: 68.1 kg 68.1 kg    Examination:  General exam: Appears calm and comfortable. Respiratory system: Clear to auscultation. Respiratory effort normal. Cardiovascular system: S1 & S2 heard, RRR.  Gastrointestinal system: Abdomen is nondistended, soft and nontender. No organomegaly or masses felt. Normal bowel sounds heard. Central nervous system: Awake and alert.  Patient moves all extremities.   Extremities: No leg edema.  Data Reviewed: I have personally reviewed following labs and imaging studies  CBC: Recent Labs  Lab 06/14/19 1354 06/14/19 2140 06/15/19 0429  WBC 6.5  --  6.4  NEUTROABS 5.5  --   --   HGB 7.0* 7.8* 7.4*  HCT 22.9* 24.6* 23.8*  MCV 83.6  --  84.4  PLT 165  --  409*   Basic Metabolic Panel: Recent Labs  Lab 06/14/19 1354 06/15/19 0429  NA 136 138  K 3.5 3.3*  CL 104 108  CO2 22 21*  GLUCOSE 167* 88  BUN 27* 20  CREATININE 1.39* 1.16  CALCIUM 8.6* 8.1*   GFR: Estimated Creatinine Clearance: 44.8 mL/min (by C-G formula based on SCr of 1.16  mg/dL). Liver Function Tests: Recent Labs  Lab 06/14/19 1354  AST 60*  ALT 30  ALKPHOS 45  BILITOT 0.4  PROT 5.9*  ALBUMIN 3.0*   No results for input(s): LIPASE, AMYLASE in the last 168 hours. No results for input(s): AMMONIA in the last 168 hours. Coagulation Profile: Recent Labs  Lab 06/14/19 1354  INR 1.2   Cardiac Enzymes: No results for input(s): CKTOTAL, CKMB, CKMBINDEX, TROPONINI in the last 168 hours. BNP (last 3 results) No results for input(s): PROBNP in the last 8760 hours. HbA1C: No results for input(s): HGBA1C in the last 72 hours. CBG: Recent Labs  Lab 06/14/19 2034 06/14/19 2334 06/15/19 0404 06/15/19 0855  GLUCAP 88 87 84 84   Lipid Profile: No results for input(s): CHOL, HDL, LDLCALC, TRIG, CHOLHDL, LDLDIRECT in the last 72 hours. Thyroid Function Tests: No results for input(s): TSH, T4TOTAL, FREET4, T3FREE, THYROIDAB in the last 72 hours. Anemia Panel: No results for input(s): VITAMINB12, FOLATE, FERRITIN, TIBC, IRON, RETICCTPCT in the last 72 hours. Urine analysis:    Component Value Date/Time   COLORURINE YELLOW 07/04/2018 1000   APPEARANCEUR TURBID (A) 07/04/2018 1000   LABSPEC 1.008 07/04/2018 1000   LABSPEC 1.020 04/24/2015  0846   PHURINE 6.0 07/04/2018 1000   GLUCOSEU >=500 (A) 07/04/2018 1000   GLUCOSEU 250 04/24/2015 0846   HGBUR LARGE (A) 07/04/2018 1000   BILIRUBINUR NEGATIVE 07/04/2018 1000   BILIRUBINUR Negative 04/24/2015 0846   KETONESUR NEGATIVE 07/04/2018 1000   PROTEINUR 30 (A) 07/04/2018 1000   UROBILINOGEN 0.2 04/24/2015 0846   NITRITE NEGATIVE 07/04/2018 1000   LEUKOCYTESUR LARGE (A) 07/04/2018 1000   LEUKOCYTESUR Negative 04/24/2015 0846   Sepsis Labs: @LABRCNTIP (procalcitonin:4,lacticidven:4)  ) Recent Results (from the past 240 hour(s))  MRSA PCR Screening     Status: None   Collection Time: 06/14/19  7:07 PM   Specimen: Nasal Mucosa; Nasopharyngeal  Result Value Ref Range Status   MRSA by PCR NEGATIVE  NEGATIVE Final    Comment:        The GeneXpert MRSA Assay (FDA approved for NASAL specimens only), is one component of a comprehensive MRSA colonization surveillance program. It is not intended to diagnose MRSA infection nor to guide or monitor treatment for MRSA infections. Performed at [facility], [employer] [address] [place], [place] [address]          Radiology Studies: No results found.      Scheduled Meds: . allopurinol  50 mg Oral QPC breakfast  . atorvastatin  20 mg Oral QPM  . Chlorhexidine Gluconate Cloth  6 each Topical Daily  . fenofibrate  160 mg Oral QPM  . gabapentin  300 mg Oral TID  . insulin glargine  10 Units Subcutaneous Daily  . lipase/protease/amylase  12,000 Units Oral TID WC  . loratadine  10 mg Oral Daily  . magnesium oxide  400 mg Oral BID  . potassium chloride  40 mEq Oral Once  . sertraline  100 mg Oral QPC breakfast   Continuous Infusions: . sodium chloride Stopped (06/14/19 1507)  . sodium chloride 100 mL/hr at 06/15/19 0600  . pantoprozole (PROTONIX) infusion 8 mg/hr (06/15/19 0600)     LOS: 0 days    Time spent: 35 minutes    Dana Allan, MD  [facility] Pager #: [phone] 7PM-7AM contact night coverage as above

## 2019-06-15 NOTE — Op Note (Signed)
[facility] Patient Name: [name] Procedure Date: 06/15/2019 MRN: [id number] Attending MD: Carol Ada , MD Date of Birth: 1931-07-15 CSN: [id number] Age: 86 Admit Type: Inpatient Procedure:                Upper GI endoscopy Indications:              Heme positive stool, Melena Providers:                Carol Ada, MD, Glori Bickers, RN, Marguerita Merles, Technician Referring MD:              Medicines:                 Complications:            No immediate complications. Estimated Blood Loss:     Estimated blood loss: none. Procedure:                Pre-Anesthesia Assessment:                           - Prior to the procedure, a History and Physical                            was performed, and patient medications and                            allergies were reviewed. The patient's tolerance of                            previous anesthesia was also reviewed. The risks                            and benefits of the procedure and the sedation                            options and risks were discussed with the patient.                            All questions were answered, and informed consent                            was obtained. Prior Anticoagulants: The patient has                            taken no previous anticoagulant or antiplatelet                            agents. ASA Grade Assessment: III - A patient with                            severe systemic disease. After reviewing the risks                            and  benefits, the patient was deemed in                            satisfactory condition to undergo the procedure.                           - Sedation was administered by an anesthesia                            professional. Deep sedation was attained.                           After obtaining informed consent, the endoscope was                            passed under direct vision. Throughout the                    procedure, the patient's blood pressure, pulse, and                            oxygen saturations were monitored continuously. The                            GIF-H190 ([id number] Olympus gastroscope was                            introduced through the mouth, and advanced to the                            third part of duodenum. The upper GI endoscopy was                            accomplished without difficulty. The patient                            tolerated the procedure well. Scope In: Scope Out: Findings:      The esophagus was normal.      The stomach was normal.      A large non-bleeding diverticulum was found in the third portion of the       duodenum. Impression:               - Normal esophagus.                           - Normal stomach.                           - Non-bleeding duodenal diverticulum.                           - No specimens collected. Moderate Sedation:      Not Applicable - Patient had care per Anesthesia. Recommendation:           - Return patient to hospital ward for ongoing care.                           -  I will discuss with the patient about performing                            a colonoscopy. Procedure Code(s):        --- Professional ---                           [phone], Esophagogastroduodenoscopy, flexible,                            transoral; diagnostic, including collection of                            specimen(s) by brushing or washing, when performed                            (separate procedure) Diagnosis Code(s):        --- Professional ---                           R19.5, Other fecal abnormalities                           K92.1, Melena (includes Hematochezia)                           K57.10, Diverticulosis of small intestine without                            perforation or abscess without bleeding CPT copyright 2019 [employer]. All rights reserved. The codes documented in this report are preliminary  and upon coder review may  be revised to meet current compliance requirements. Carol Ada, MD Carol Ada, MD 06/15/2019 7:57:02 AM This report has been signed electronically. Number of Addenda: 0

## 2019-06-15 NOTE — Interval H&P Note (Signed)
History and Physical Interval Note:  06/15/2019 7:53 AM  [name]  has presented today for surgery, with the diagnosis of gi bleed.  The various methods of treatment have been discussed with the patient and family. After consideration of risks, benefits and other options for treatment, the patient has consented to  Procedure(s): ESOPHAGOGASTRODUODENOSCOPY (EGD) WITH PROPOFOL (N/A) as a surgical intervention.  The patient's history has been reviewed, patient examined, no change in status, stable for surgery.  I have reviewed the patient's chart and labs.  Questions were answered to the patient's satisfaction.     Sumiko Ceasar D

## 2019-06-15 NOTE — Transfer of Care (Signed)
Immediate Anesthesia Transfer of Care Note  Patient: [name]  Procedure(s) Performed: ESOPHAGOGASTRODUODENOSCOPY (EGD) WITH PROPOFOL (N/A )  Patient Location: PACU  Anesthesia Type:MAC  Level of Consciousness: awake, alert  and oriented  Airway & Oxygen Therapy: Patient Spontanous Breathing and Patient connected to face mask oxygen  Post-op Assessment: Report given to RN and Post -op Vital signs reviewed and stable  Post vital signs: Reviewed and stable  Last Vitals:  Vitals Value Taken Time  BP    Temp    Pulse 66 06/15/19 0757  Resp 18 06/15/19 0757  SpO2 100 % 06/15/19 0757  Vitals shown include unvalidated device data.  Last Pain:  Vitals:   06/15/19 0721  TempSrc: Temporal  PainSc: 0-No pain         Complications: No apparent anesthesia complications

## 2019-06-15 NOTE — Progress Notes (Addendum)
PT had large BM after bowel prep started with no signs of blood present visually.

## 2019-06-15 NOTE — Anesthesia Preprocedure Evaluation (Signed)
Anesthesia Evaluation  Patient identified by MRN, date of birth, ID band Patient awake    Reviewed: Allergy & Precautions, NPO status , Patient's Chart, lab work & pertinent test results  History of Anesthesia Complications Negative for: history of anesthetic complications  Airway Mallampati: II  TM Distance: >3 FB Neck ROM: Full    Dental  (+) Edentulous Upper, Upper Dentures, Partial Lower, Missing, Poor Dentition, Dental Advisory Given   Pulmonary sleep apnea ,    Pulmonary exam normal        Cardiovascular hypertension, + CAD  + dysrhythmias Atrial Fibrillation + Valvular Problems/Murmurs  Rhythm:Regular Rate:Normal  EKG: 11/15/17 Rate 70 bpm Normal sinus rhythm  Inferior infarct, age undetermined Anterior infarct, age undetermined  CV: Echo 10/10/17 Study Conclusions  - Left ventricle: The cavity size was normal. Wall thickness was increased in a pattern of mild LVH. Systolic function was vigorous. The estimated ejection fraction was in the range of 65% to 70%. Wall motion was normal; there were no regional wall motion abnormalities. Doppler parameters are consistent with abnormal left ventricular relaxation (grade 1 diastolic dysfunction). The E/e&' ratio is between 8-15, suggesting indeterminate LV filling pressure. - Aortic valve: Sclerosis without stenosis. There was no regurgitation. - Mitral valve: Mildly thickened leaflets . There was trivial regurgitation. - Left atrium: The atrium was normal in size. - Inferior vena cava: The vessel was normal in size. The respirophasic diameter changes were in the normal range (>= 50%), consistent with normal central venous pressure.  Impressions:  - Compared to a prior study in 2016, the LVEF is higher at 65-70%. The aortic valve is sclerotic, but not stenotic.   Neuro/Psych negative neurological ROS  negative psych ROS    GI/Hepatic Neg liver ROS, GERD  Medicated,  Endo/Other  diabetes, Type 2  Renal/GU CRFRenal disease  negative genitourinary   Musculoskeletal negative musculoskeletal ROS (+)   Abdominal   Peds negative pediatric ROS (+)  Hematology  (+) Blood dyscrasia, anemia , Non hodgkin's lymphoma   Anesthesia Other Findings   Reproductive/Obstetrics negative OB ROS                             Anesthesia Physical  Anesthesia Plan  ASA: IV  Anesthesia Plan: MAC   Post-op Pain Management:    Induction: Intravenous  PONV Risk Score and Plan: 1 and Ondansetron and Treatment may vary due to age or medical condition  Airway Management Planned: Nasal Cannula  Additional Equipment:   Intra-op Plan:   Post-operative Plan:   Informed Consent: I have reviewed the patients History and Physical, chart, labs and discussed the procedure including the risks, benefits and alternatives for the proposed anesthesia with the patient or authorized representative who has indicated his/her understanding and acceptance.     Dental advisory given  Plan Discussed with: CRNA and Anesthesiologist  Anesthesia Plan Comments:         Anesthesia Quick Evaluation

## 2019-06-16 ENCOUNTER — Encounter (HOSPITAL_COMMUNITY): Payer: Self-pay | Admitting: Internal Medicine

## 2019-06-16 ENCOUNTER — Inpatient Hospital Stay (HOSPITAL_COMMUNITY): Payer: Medicare Other | Admitting: Anesthesiology

## 2019-06-16 ENCOUNTER — Encounter (HOSPITAL_COMMUNITY)
Admission: EM | Disposition: A | Discharge status: Home / Self Care | Payer: Self-pay | Source: Home / Self Care | Attending: Internal Medicine

## 2019-06-16 DIAGNOSIS — I9581 Postprocedural hypotension: Secondary | ICD-10-CM

## 2019-06-16 DIAGNOSIS — E11649 Type 2 diabetes mellitus with hypoglycemia without coma: Secondary | ICD-10-CM

## 2019-06-16 DIAGNOSIS — K922 Gastrointestinal hemorrhage, unspecified: Secondary | ICD-10-CM

## 2019-06-16 HISTORY — PX: GIVENS CAPSULE STUDY: SHX5432

## 2019-06-16 HISTORY — PX: COLONOSCOPY WITH PROPOFOL: SHX5780

## 2019-06-16 LAB — GLUCOSE, CAPILLARY
Glucose-Capillary: 110 mg/dL — ABNORMAL HIGH (ref 70–99)
Glucose-Capillary: 115 mg/dL — ABNORMAL HIGH (ref 70–99)
Glucose-Capillary: 167 mg/dL — ABNORMAL HIGH (ref 70–99)
Glucose-Capillary: 182 mg/dL — ABNORMAL HIGH (ref 70–99)
Glucose-Capillary: 59 mg/dL — ABNORMAL LOW (ref 70–99)
Glucose-Capillary: 71 mg/dL (ref 70–99)
Glucose-Capillary: 76 mg/dL (ref 70–99)
Glucose-Capillary: 78 mg/dL (ref 70–99)
Glucose-Capillary: 90 mg/dL (ref 70–99)
Glucose-Capillary: 93 mg/dL (ref 70–99)

## 2019-06-16 LAB — RENAL FUNCTION PANEL
Albumin: 2.6 g/dL — ABNORMAL LOW (ref 3.5–5.0)
Anion gap: 4 — ABNORMAL LOW (ref 5–15)
BUN: 12 mg/dL (ref 8–23)
CO2: 21 mmol/L — ABNORMAL LOW (ref 22–32)
Calcium: 7.9 mg/dL — ABNORMAL LOW (ref 8.9–10.3)
Chloride: 112 mmol/L — ABNORMAL HIGH (ref 98–111)
Creatinine, Ser: 1.01 mg/dL (ref 0.61–1.24)
GFR calc Af Amer: >60 mL/min (ref >60)
GFR calc non Af Amer: >60 mL/min (ref >60)
Glucose, Bld: 76 mg/dL (ref 70–99)
Phosphorus: 2 mg/dL — ABNORMAL LOW (ref 2.5–4.6)
Potassium: 3.7 mmol/L (ref 3.5–5.1)
Sodium: 137 mmol/L (ref 135–145)

## 2019-06-16 LAB — CBC WITH DIFFERENTIAL/PLATELET
Abs Immature Granulocytes: 0.46 10*3/uL — ABNORMAL HIGH (ref 0.00–0.07)
Basophils Absolute: 0 10*3/uL (ref 0.0–0.1)
Basophils Relative: 1 %
Eosinophils Absolute: 0.1 10*3/uL (ref 0.0–0.5)
Eosinophils Relative: 2 %
HCT: 26.5 % — ABNORMAL LOW (ref 39.0–52.0)
Hemoglobin: 8 g/dL — ABNORMAL LOW (ref 13.0–17.0)
Immature Granulocytes: 8 %
Lymphocytes Relative: 2 %
Lymphs Abs: 0.1 10*3/uL — ABNORMAL LOW (ref 0.7–4.0)
MCH: 26 pg (ref 26.0–34.0)
MCHC: 30.2 g/dL (ref 30.0–36.0)
MCV: 86 fL (ref 80.0–100.0)
Monocytes Absolute: 0.3 10*3/uL (ref 0.1–1.0)
Monocytes Relative: 5 %
Neutro Abs: 5.2 10*3/uL (ref 1.7–7.7)
Neutrophils Relative %: 82 %
Platelets: 169 10*3/uL (ref 150–400)
RBC: 3.08 MIL/uL — ABNORMAL LOW (ref 4.22–5.81)
RDW: 18.3 % — ABNORMAL HIGH (ref 11.5–15.5)
WBC: 6.1 10*3/uL (ref 4.0–10.5)
nRBC: 0 % (ref 0.0–0.2)

## 2019-06-16 LAB — MAGNESIUM: Magnesium: 1.7 mg/dL (ref 1.7–2.4)

## 2019-06-16 SURGERY — COLONOSCOPY WITH PROPOFOL
Anesthesia: Monitor Anesthesia Care

## 2019-06-16 SURGERY — IMAGING PROCEDURE, GI TRACT, INTRALUMINAL, VIA CAPSULE
Anesthesia: LOCAL

## 2019-06-16 MED ORDER — DEXTROSE 50 % IV SOLN
1.0000 | Freq: Once | INTRAVENOUS | Status: AC
  Administered 2019-06-16: 50 mL via INTRAVENOUS

## 2019-06-16 MED ORDER — PROPOFOL 1000 MG/100ML IV EMUL
INTRAVENOUS | Status: AC
  Filled 2019-06-16: qty 200

## 2019-06-16 MED ORDER — DEXTROSE 50 % IV SOLN
INTRAVENOUS | Status: AC
  Filled 2019-06-16: qty 50

## 2019-06-16 MED ORDER — EPHEDRINE SULFATE-NACL 50-0.9 MG/10ML-% IV SOSY
PREFILLED_SYRINGE | INTRAVENOUS | Status: DC | PRN
  Administered 2019-06-16: 10 mg via INTRAVENOUS

## 2019-06-16 MED ORDER — PHENYLEPHRINE 40 MCG/ML (10ML) SYRINGE FOR IV PUSH (FOR BLOOD PRESSURE SUPPORT)
PREFILLED_SYRINGE | INTRAVENOUS | Status: DC | PRN
  Administered 2019-06-16: 80 ug via INTRAVENOUS

## 2019-06-16 MED ORDER — PROPOFOL 500 MG/50ML IV EMUL
INTRAVENOUS | Status: DC | PRN
  Administered 2019-06-16: 100 ug/kg/min via INTRAVENOUS

## 2019-06-16 MED ORDER — PROPOFOL 500 MG/50ML IV EMUL
INTRAVENOUS | Status: AC
  Filled 2019-06-16: qty 150

## 2019-06-16 MED ORDER — PROPOFOL 500 MG/50ML IV EMUL
INTRAVENOUS | Status: DC | PRN
  Administered 2019-06-16: 25 mg via INTRAVENOUS

## 2019-06-16 MED ORDER — SODIUM CHLORIDE 0.9 % IV SOLN: INTRAVENOUS | Status: DC

## 2019-06-16 MED ORDER — LACTATED RINGERS IV SOLN: INTRAVENOUS | Status: DC | PRN

## 2019-06-16 MED ORDER — K PHOS MONO-SOD PHOS DI & MONO 155-852-130 MG PO TABS
250.0000 mg | ORAL_TABLET | Freq: Three times a day (TID) | ORAL | Status: AC
  Administered 2019-06-16 – 2019-06-17 (×5): 250 mg via ORAL
  Filled 2019-06-16 (×7): qty 1

## 2019-06-16 MED ORDER — PHENYLEPHRINE HCL (PRESSORS) 10 MG/ML IV SOLN
INTRAVENOUS | Status: DC | PRN
  Administered 2019-06-16 (×3): 80 ug via INTRAVENOUS

## 2019-06-16 SURGICAL SUPPLY — 21 items

## 2019-06-16 SURGICAL SUPPLY — 1 items: TOWEL COTTON PACK 4EA (MISCELLANEOUS) ×4 IMPLANT

## 2019-06-16 NOTE — Transfer of Care (Signed)
Immediate Anesthesia Transfer of Care Note  Patient: [name]  Procedure(s) Performed: COLONOSCOPY WITH PROPOFOL (N/A )  Patient Location: PACU  Anesthesia Type:MAC  Level of Consciousness: sedated, patient cooperative and responds to stimulation  Airway & Oxygen Therapy: Patient Spontanous Breathing and Patient connected to face mask oxygen  Post-op Assessment: Report given to RN and Post -op Vital signs reviewed and stable  Post vital signs: Reviewed and stable  Last Vitals:  Vitals Value Taken Time  BP 153/135 06/16/19 0804  Temp    Pulse 51 06/16/19 0805  Resp 16 06/16/19 0805  SpO2 100 % 06/16/19 0805  Vitals shown include unvalidated device data.  Last Pain:  Vitals:   06/16/19 0719  TempSrc: Oral  PainSc: 0-No pain         Complications: No apparent anesthesia complications

## 2019-06-16 NOTE — Anesthesia Postprocedure Evaluation (Signed)
Anesthesia Post Note  Patient: [name]  Procedure(s) Performed: COLONOSCOPY WITH PROPOFOL (N/A )     Patient location during evaluation: Endoscopy Anesthesia Type: MAC Level of consciousness: awake Pain management: pain level controlled Vital Signs Assessment: post-procedure vital signs reviewed and stable Respiratory status: spontaneous breathing, nonlabored ventilation, respiratory function stable and patient connected to nasal cannula oxygen Cardiovascular status: stable and blood pressure returned to baseline Postop Assessment: no apparent nausea or vomiting Anesthetic complications: no Comments: Hypotension treated in recovery.     Last Vitals:  Vitals:   06/16/19 0840 06/16/19 0917  BP: (!) 124/49 (!) 139/54  Pulse: 61 64  Resp: 12 15  Temp:  36.8 C  SpO2: 98% 96%    Last Pain:  Vitals:   06/16/19 0917  TempSrc: Oral  PainSc:                  Karyl Kinnier Lalana Wachter

## 2019-06-16 NOTE — Progress Notes (Signed)
PROGRESS NOTE    [name]  EGB:[id number] DOB: 1931/09/10 DOA: 06/14/2019 PCP: [name], MD  Outpatient Specialists:   Brief Narrative:  Patient is an 86 year old Caucasian male with past medical history significant for atrial fibrillation on anticoagulation, CAD s/p CABG, CKD stage III, stage IV prostate cancer, non-hodgkin's lymphoma, diabetes mellitus, type II with neuropathy on insulin.  Patient presents for a with history of dark stools per rectum 2 days ago.  Patient underwent EGD that was nonrevealing.  GI is considering possible colonoscopy.  Hemoglobin on presentation was 7 g/dL, down from 9.2 g/dL.  Patient has been transfused 1 unit of packed red blood cells.  Hemoglobin today 7.4 g/dL.  Potassium 3.3.  Vital signs have remained stable.  No further bleeding reported.  No chest pain, no shortness of breath, no fatigue, no headache, no neck pain no other constitutional symptoms.  Further management will depend on hospital course.  06/16/2019: Patient underwent colonoscopy today.  Colonoscopy revealed diverticulosis involving the descending colon and the sigmoid colon.  Patient became hypotensive after the procedure but this resolved with volume resuscitation.  Repeat hemoglobin is 8 g/dL.  Capsule endoscopy is planned.  Patient to start clear liquid diet after capsule endoscopy.  Blood sugar of 59 noted earlier, D50 given, will check blood sugar every 1 hour for the next 4 hours.  Hopefully, this will resolve when patient starts diet.  Assessment & Plan:   Active Problems:   Symptomatic anemia   GI bleed  Acute blood loss anemia Memialena Acute on chronic anemia Baseline hemoglobin between 9-10. Likely upper GI source of bleed. Patient has not seen a gastroenterologist in at least 6 years after being cleared from needing to perform colonoscopies. Patient has required multiple transfusions in the past for multiple reasons, most recently relating to COVID-19 infection.  -EGD  came back normal.   -GI team is directing care.   -Patient has been transfused with 1 unit of PRBC.  -Discontinue Protonix drip. -Start Protonix 40 mg p.o. once daily. -GI is considering discussing possible colonoscopy with patient. 06/16/2019: Patient underwent colonoscopy (please see above).  Capsule endoscopy is planned.  H&H is stable.  Continue to monitor closely.  Patient became hypotensive after the colonoscopy but this responded to IV fluids.  Hypoglycemia: -Blood sugar of 59 documented. -D50 given. -Monitor blood sugar every hour for the next 4 hours, then every 4 hours. -We will hold subcutaneous Lantus insulin tomorrow if patient's blood sugar remains low. -Patient is on blood sugar check every 4 hours but no sliding scale insulin coverage. -Continue to monitor closely, and manage accordingly.  Hypokalemia: -K-Dur 40 M EQ p.o. once. -Renal panel in the morning -Continue to monitor electrolytes and renal function.  06/15/2018: Resolved significantly.  Potassium is 3.7 today.  Acute kidney injury:  -This likely prerenal.   -AKI slowly resolving.   -Serum creatinine has dropped from 1.39 to 1.16.   -Gentle hydration.   06/15/2018: Resolved.  Serum creatinine is 1.01.  Diabetic foot ulcer Patient follows with podiatry and has been receiving x-rays to rule out osteomyelitis. Currently does not appear infected. He has recently been treated for a cellulitis. Complicated by charcot foot.  Paroxysmal atrial fibrillation Patient was on Xarelto as an outpatient. Not on rate control medications -Hold Xarelto secondary to GI bleed  Non-hodgkin's lymphoma Stage IV prostate cancer Patient is currently on maintenance rituximab for NHL and follows with Dr. Jana Hakim as an outpatient. He follows with Urology, Dr. 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  Monitor blood sugar every 6 hours.  Change AC at bedtime when patient resumes diet.  Possible UTI Per daughter, patient is being treated with nitrofurantoin. Unknown organism/sensitivities. Patient reports no symptoms but daughter states he generally does not report issues. Kidney function does not support safe usage of nitrofurantoin at this time -Discontinue nitrofurantoin -Watch for urinary symptoms 06/15/2019: Check urinalysis and urine culture 06/15/2018: Follow urine culture result is pending.  CAD s/p CABG Hyperlipidemia Noted on history -Continue Lipitor and fenofibrate  Gout -Renally dose allopurinol  OSA Not using CPAP  DVT prophylaxis: SCD Code Status: DO NOT RESUSCITATE Family Communication:  Disposition Plan: Home eventually   Consultants:   GI  Procedures:   EGD  Colonoscopy  Antimicrobials:   None   Subjective: No new complaints No further bleeding reported No chest pain no shortness of breath  Objective: Vitals:   06/16/19 0830 06/16/19 0835 06/16/19 0840 06/16/19 0917  BP: (!) 126/46  (!) 124/49 (!) 139/54  Pulse: 63 62 61 64  Resp: 11 16 12 15   Temp:    98.2 F (36.8 C)  TempSrc:    Oral  SpO2: 100% 98% 98% 96%  Weight:      Height:        Intake/Output Summary (Last 24 hours) at 06/16/2019 0959 Last data filed at 06/16/2019 0830 Gross per 24 hour  Intake 4956.31 ml  Output 1280 ml  Net 3676.31 ml   Filed Weights   06/14/19 2000 06/15/19 0721 06/16/19 0719  Weight: 68.1 kg 68.1 kg 68.1 kg    Examination:  General exam: Appears calm and comfortable. Respiratory system: Clear to auscultation. Respiratory effort normal. Cardiovascular system: S1 & S2 heard, RRR.  Gastrointestinal system: Abdomen is nondistended, soft and nontender. No organomegaly or masses felt. Normal  bowel sounds heard. Central nervous system: Awake and alert.  Patient moves all extremities.   Extremities: No leg edema.  Data Reviewed: I have personally reviewed following labs and imaging studies  CBC: Recent Labs  Lab 06/14/19 1354 06/14/19 2140 06/15/19 0429  WBC 6.5  --  6.4  NEUTROABS 5.5  --   --   HGB 7.0* 7.8* 7.4*  HCT 22.9* 24.6* 23.8*  MCV 83.6  --  84.4  PLT 165  --  220*   Basic Metabolic Panel: Recent Labs  Lab 06/14/19 1354 06/15/19 0429 06/16/19 0415  NA 136 138 137  K 3.5 3.3* 3.7  CL 104 108 112*  CO2 22 21* 21*  GLUCOSE 167* 88 76  BUN 27* 20 12  CREATININE 1.39* 1.16 1.01  CALCIUM 8.6* 8.1* 7.9*  MG  --   --  1.7  PHOS  --   --  2.0*   GFR: Estimated Creatinine Clearance: 51.5 mL/min (by C-G formula based on SCr of 1.01 mg/dL). Liver Function Tests: Recent Labs  Lab 06/14/19 1354 06/16/19 0415  AST 60*  --   ALT 30  --   ALKPHOS 45  --   BILITOT 0.4  --   PROT 5.9*  --   ALBUMIN 3.0* 2.6*   No results for input(s): LIPASE, AMYLASE in the last 168 hours. No results for input(s): AMMONIA in the last 168 hours. Coagulation Profile: Recent Labs  Lab 06/14/19  1354  INR 1.2   Cardiac Enzymes: No results for input(s): CKTOTAL, CKMB, CKMBINDEX, TROPONINI in the last 168 hours. BNP (last 3 results) No results for input(s): PROBNP in the last 8760 hours. HbA1C: No results for input(s): HGBA1C in the last 72 hours. CBG: Recent Labs  Lab 06/15/19 1731 06/15/19 1950 06/16/19 0017 06/16/19 0413 06/16/19 0913  GLUCAP 138* 92 71 78 76   Lipid Profile: No results for input(s): CHOL, HDL, LDLCALC, TRIG, CHOLHDL, LDLDIRECT in the last 72 hours. Thyroid Function Tests: No results for input(s): TSH, T4TOTAL, FREET4, T3FREE, THYROIDAB in the last 72 hours. Anemia Panel: No results for input(s): VITAMINB12, FOLATE, FERRITIN, TIBC, IRON, RETICCTPCT in the last 72 hours. Urine analysis:    Component Value Date/Time   COLORURINE YELLOW  06/15/2019 1241   APPEARANCEUR CLEAR 06/15/2019 1241   LABSPEC 1.011 06/15/2019 1241   LABSPEC 1.020 04/24/2015 0846   PHURINE 6.0 06/15/2019 1241   GLUCOSEU 50 (A) 06/15/2019 1241   GLUCOSEU 250 04/24/2015 0846   HGBUR NEGATIVE 06/15/2019 1241   BILIRUBINUR NEGATIVE 06/15/2019 1241   BILIRUBINUR Negative 04/24/2015 0846   [place] 06/15/2019 1241   PROTEINUR 30 (A) 06/15/2019 1241   UROBILINOGEN 0.2 04/24/2015 0846   NITRITE NEGATIVE 06/15/2019 1241   LEUKOCYTESUR NEGATIVE 06/15/2019 1241   LEUKOCYTESUR Negative 04/24/2015 0846   Sepsis Labs: @LABRCNTIP (procalcitonin:4,lacticidven:4)  ) Recent Results (from the past 240 hour(s))  MRSA PCR Screening     Status: None   Collection Time: 06/14/19  7:07 PM   Specimen: Nasal Mucosa; Nasopharyngeal  Result Value Ref Range Status   MRSA by PCR NEGATIVE NEGATIVE Final    Comment:        The GeneXpert MRSA Assay (FDA approved for NASAL specimens only), is one component of a comprehensive MRSA colonization surveillance program. It is not intended to diagnose MRSA infection nor to guide or monitor treatment for MRSA infections. Performed at [facility], [place] [address], [place], [place] [address]          Radiology Studies: No results found.      Scheduled Meds: . allopurinol  50 mg Oral QPC breakfast  . atorvastatin  20 mg Oral QPM  . Chlorhexidine Gluconate Cloth  6 each Topical Daily  . fenofibrate  160 mg Oral QPM  . gabapentin  300 mg Oral TID  . insulin glargine  10 Units Subcutaneous Daily  . lipase/protease/amylase  12,000 Units Oral TID WC  . loratadine  10 mg Oral Daily  . magnesium oxide  400 mg Oral BID  . pantoprazole  40 mg Oral Daily  . phosphorus  250 mg Oral TID  . sertraline  100 mg Oral QPC breakfast   Continuous Infusions: . sodium chloride Stopped (06/14/19 1507)  . sodium chloride 100 mL/hr at 06/16/19 0917     LOS: 1 day    Time spent: 35  minutes    Dana Allan, MD  [facility] Pager #: [phone] 7PM-7AM contact night coverage as above

## 2019-06-16 NOTE — Progress Notes (Signed)
Dr. Roanna Banning made aware that sbp in 80's. HR 55. Pt slowly waking up. Fluid wide open and in trendelenburg. Continue to finish giving rest of liter of saline per dr. Roanna Banning.

## 2019-06-16 NOTE — Progress Notes (Signed)
Hypoglycemic Event  CBG: 59  Treatment: 1 Amp D50 Symptoms: NA  Follow-up CBG: Time:1158 CBG Result:182  Possible Reasons for Event: Pt NPO  Comments/MD notified: MD orders Q1 checks for 1 hour    Wray Kearns, RN

## 2019-06-16 NOTE — Op Note (Signed)
[facility] Patient Name: [name] Procedure Date: 06/16/2019 MRN: [id number] Attending MD: Carol Ada , MD Date of Birth: [birth year] CSN: [id number] Age: 86 Admit Type: Inpatient Procedure:                Colonoscopy Indications:              Heme positive stool, Melena Providers:                Carol Ada, MD, Elmer Ramp. Tilden Dome, RN, Laverda Sorenson, Technician, Herbie Drape, CRNA Referring MD:              Medicines:                Propofol per Anesthesia Complications:            No immediate complications. Estimated Blood Loss:     Estimated blood loss: none. Procedure:                Pre-Anesthesia Assessment:                           - Prior to the procedure, a History and Physical                            was performed, and patient medications and                            allergies were reviewed. The patient's tolerance of                            previous anesthesia was also reviewed. The risks                            and benefits of the procedure and the sedation                            options and risks were discussed with the patient.                            All questions were answered, and informed consent                            was obtained. Prior Anticoagulants: The patient has                            taken Xarelto (rivaroxaban), last dose was 3 days                            prior to procedure. ASA Grade Assessment: III - A                            patient with severe systemic disease. After  reviewing the risks and benefits, the patient was                            deemed in satisfactory condition to undergo the                            procedure.                           - Sedation was administered by an anesthesia                            professional. Deep sedation was attained.                           After obtaining informed consent, the colonoscope                           was passed under direct vision. Throughout the                            procedure, the patient's blood pressure, pulse, and                            oxygen saturations were monitored continuously. The                            CF-HQ190L ([id number]) Olympus colonoscope was                            introduced through the anus and advanced to the the                            terminal ileum. The colonoscopy was performed                            without difficulty. The patient tolerated the                            procedure well. The quality of the bowel                            preparation was good. The terminal ileum, ileocecal                            valve, appendiceal orifice, and rectum were                            photographed. Scope In: 7:43:37 AM Scope Out: 5:63:14 AM Scope Withdrawal Time: 0 hours 10 minutes 40 seconds  Total Procedure Duration: 0 hours 15 minutes 42 seconds  Findings:      Scattered small and large-mouthed diverticula were found in the sigmoid       colon and descending colon. The distal 20 cm of the terminal ileum was       examined.  Impression:               - Diverticulosis in the sigmoid colon and in the                            descending colon.                           - No specimens collected. Moderate Sedation:      Not Applicable - Patient had care per Anesthesia. Recommendation:           - Return patient to hospital ward for ongoing care.                           - NPO.                           - Continue present medications.                           - To visualize the small bowel, perform video                            capsule endoscopy today. Procedure Code(s):        --- Professional ---                           [phone], Colonoscopy, flexible; diagnostic, including                            collection of specimen(s) by brushing or washing,                            when performed (separate  procedure) Diagnosis Code(s):        --- Professional ---                           R19.5, Other fecal abnormalities                           K92.1, Melena (includes Hematochezia)                           K57.30, Diverticulosis of large intestine without                            perforation or abscess without bleeding CPT copyright 2019 [employer]. All rights reserved. The codes documented in this report are preliminary and upon coder review may  be revised to meet current compliance requirements. Carol Ada, MD Carol Ada, MD 06/16/2019 8:09:07 AM This report has been signed electronically. Number of Addenda: 0

## 2019-06-16 NOTE — Interval H&P Note (Signed)
History and Physical Interval Note:  06/16/2019 7:31 AM  [name]  has presented today for surgery, with the diagnosis of Melena.  The various methods of treatment have been discussed with the patient and family. After consideration of risks, benefits and other options for treatment, the patient has consented to  Procedure(s): COLONOSCOPY WITH PROPOFOL (N/A) as a surgical intervention.  The patient's history has been reviewed, patient examined, no change in status, stable for surgery.  I have reviewed the patient's chart and labs.  Questions were answered to the patient's satisfaction.     Linsey Hirota D

## 2019-06-16 NOTE — Progress Notes (Signed)
BP slowly improving . Dr. Roanna Banning to bedside to give meds.  Pt starting to wake up. Continue to assess.

## 2019-06-17 ENCOUNTER — Encounter: Payer: Self-pay | Admitting: *Deleted

## 2019-06-17 LAB — RENAL FUNCTION PANEL
Albumin: 2.5 g/dL — ABNORMAL LOW (ref 3.5–5.0)
Anion gap: 6 (ref 5–15)
BUN: 10 mg/dL (ref 8–23)
CO2: 21 mmol/L — ABNORMAL LOW (ref 22–32)
Calcium: 7.9 mg/dL — ABNORMAL LOW (ref 8.9–10.3)
Chloride: 111 mmol/L (ref 98–111)
Creatinine, Ser: 0.94 mg/dL (ref 0.61–1.24)
GFR calc Af Amer: >60 mL/min (ref >60)
GFR calc non Af Amer: >60 mL/min (ref >60)
Glucose, Bld: 93 mg/dL (ref 70–99)
Phosphorus: 2.3 mg/dL — ABNORMAL LOW (ref 2.5–4.6)
Potassium: 3.7 mmol/L (ref 3.5–5.1)
Sodium: 138 mmol/L (ref 135–145)

## 2019-06-17 LAB — CBC WITH DIFFERENTIAL/PLATELET
Abs Immature Granulocytes: 0.36 10*3/uL — ABNORMAL HIGH (ref 0.00–0.07)
Basophils Absolute: 0 10*3/uL (ref 0.0–0.1)
Basophils Relative: 0 %
Eosinophils Absolute: 0.1 10*3/uL (ref 0.0–0.5)
Eosinophils Relative: 2 %
HCT: 25.1 % — ABNORMAL LOW (ref 39.0–52.0)
Hemoglobin: 7.7 g/dL — ABNORMAL LOW (ref 13.0–17.0)
Immature Granulocytes: 6 %
Lymphocytes Relative: 2 %
Lymphs Abs: 0.1 10*3/uL — ABNORMAL LOW (ref 0.7–4.0)
MCH: 26.1 pg (ref 26.0–34.0)
MCHC: 30.7 g/dL (ref 30.0–36.0)
MCV: 85.1 fL (ref 80.0–100.0)
Monocytes Absolute: 0.3 10*3/uL (ref 0.1–1.0)
Monocytes Relative: 5 %
Neutro Abs: 4.9 10*3/uL (ref 1.7–7.7)
Neutrophils Relative %: 85 %
Platelets: 136 10*3/uL — ABNORMAL LOW (ref 150–400)
RBC: 2.95 MIL/uL — ABNORMAL LOW (ref 4.22–5.81)
RDW: 18.3 % — ABNORMAL HIGH (ref 11.5–15.5)
WBC: 5.8 10*3/uL (ref 4.0–10.5)
nRBC: 0 % (ref 0.0–0.2)

## 2019-06-17 LAB — PATHOLOGIST SMEAR REVIEW

## 2019-06-17 LAB — GLUCOSE, CAPILLARY
Glucose-Capillary: 70 mg/dL (ref 70–99)
Glucose-Capillary: 92 mg/dL (ref 70–99)
Glucose-Capillary: 119 mg/dL — ABNORMAL HIGH (ref 70–99)
Glucose-Capillary: 219 mg/dL — ABNORMAL HIGH (ref 70–99)
Glucose-Capillary: 184 mg/dL — ABNORMAL HIGH (ref 70–99)

## 2019-06-17 LAB — HEMOGLOBIN AND HEMATOCRIT, BLOOD
HCT: 33.1 % — ABNORMAL LOW (ref 39.0–52.0)
Hemoglobin: 10.1 g/dL — ABNORMAL LOW (ref 13.0–17.0)

## 2019-06-17 LAB — MAGNESIUM: Magnesium: 1.6 mg/dL — ABNORMAL LOW (ref 1.7–2.4)

## 2019-06-17 MED ORDER — SODIUM CHLORIDE 0.9 % IV SOLN
510.0000 mg | Freq: Once | INTRAVENOUS | Status: AC
  Administered 2019-06-17: 510 mg via INTRAVENOUS
  Filled 2019-06-17: qty 510

## 2019-06-17 MED ORDER — MAGNESIUM SULFATE IN D5W 1-5 GM/100ML-% IV SOLN
1.0000 g | Freq: Once | INTRAVENOUS | Status: AC
  Administered 2019-06-17: 09:00:00 1 g via INTRAVENOUS
  Filled 2019-06-17: qty 100

## 2019-06-17 NOTE — Evaluation (Signed)
Physical Therapy Evaluation Patient Details Name: [name] MRN: [id number] DOB: [birth year] Today's Date: 06/17/2019   History of Present Illness  86 year old male with history of A. 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 Patient continued to have anemia on 2/21 underwent colonoscopy revealing diverticulosis and descending colon and sigmoid colon.  Patient became hypotensive after the procedure but that improved with fluid resuscitation.    Clinical Impression  [name] is 86 y.o. male admitted with above HPI and diagnosis. Patient is currently limited by functional impairments below (see PT problem list). Patient lives with his daughter and is independent at baseline. Patient reports several falls in the last 6 months and would benefit from balance training and use of RW for mobilizing to reduce fall risk. Patient will benefit from continued skilled PT interventions to address impairments and progress independence with mobility, recommending HHPT. Acute PT will follow and progress as able.     Follow Up Recommendations Home health PT(HHPT vs OPPT due to transportation diffiuclties, pt will benefit from f/u for balance training and strengthening)    Equipment Recommendations  None recommended by PT    Recommendations for Other Services       Precautions / Restrictions Precautions Precautions: Fall Restrictions Weight Bearing Restrictions: No      Mobility  Bed Mobility        General bed mobility comments: Pt OOB at start of session  Transfers Overall transfer level: Needs assistance Equipment used: Rolling walker (2 wheeled) Transfers: Sit to/from Stand Sit to Stand: Supervision         General transfer comment: pt able to complete power up with bil UE use to press up, supervision for safety, pt steady with rising to  RW  Ambulation/Gait Ambulation/Gait assistance: Min guard;Supervision Gait Distance (Feet): 250 Feet Assistive device: Rolling walker (2 wheeled) Gait Pattern/deviations: Step-through pattern Gait velocity: decreased   General Gait Details: pt with poor hip/knee flexion due to weakness resulting in poor foot clearance. no overt LOB noted, pt safe with management of RW for gait.  Stairs       Wheelchair Mobility    Modified Rankin (Stroke Patients Only)       Balance Overall balance assessment: Mild deficits observed, not formally tested            Pertinent Vitals/Pain Pain Assessment: No/denies pain    Home Living Family/patient expects to be discharged to:: Private residence Living Arrangements: Children;Other relatives(pt lives with daughter) Available Help at Discharge: Family;Available 24 hours/day Type of Home: House Home Access: Ramped entrance     Home Layout: One level Home Equipment: Walker - 4 wheels;Walker - 2 wheels;Shower seat;Cane - single point;Bedside commode Additional Comments: pt reports he has extended family living with him since his wife died; lives with daughter and her husband    Prior Function Level of Independence: Independent         Comments: Pt has used a RW/rollator occasionally in his home but not regularly, he thinks it might be time to start using one though. He enjoys gardening.     Hand Dominance   Dominant Hand: Right    Extremity/Trunk Assessment   Upper Extremity Assessment Upper Extremity Assessment: Overall WFL for tasks assessed    Lower Extremity Assessment Lower Extremity Assessment: Generalized weakness    Cervical / Trunk Assessment Cervical / Trunk  Assessment: Normal  Communication   Communication: No difficulties  Cognition Arousal/Alertness: Awake/alert Behavior During Therapy: WFL for tasks assessed/performed Overall Cognitive Status: Within Functional Limits for tasks assessed            General Comments      Exercises     Assessment/Plan    PT Assessment Patient needs continued PT services  PT Problem List Decreased strength;Decreased activity tolerance;Decreased knowledge of use of DME;Decreased mobility       PT Treatment Interventions DME instruction;Therapeutic exercise;Balance training;Gait training;Stair training;Functional mobility training;Therapeutic activities;Patient/family education    PT Goals (Current goals can be found in the Care Plan section)  Acute Rehab PT Goals Patient Stated Goal: to stronger and get back into garden PT Goal Formulation: With patient Time For Goal Achievement: 07/01/19 Potential to Achieve Goals: Good    Frequency Min 3X/week    AM-PAC PT "6 Clicks" Mobility  Outcome Measure Help needed turning from your back to your side while in a flat bed without using bedrails?: A Little Help needed moving from lying on your back to sitting on the side of a flat bed without using bedrails?: A Little Help needed moving to and from a bed to a chair (including a wheelchair)?: A Little Help needed standing up from a chair using your arms (e.g., wheelchair or bedside chair)?: A Little Help needed to walk in hospital room?: A Little Help needed climbing 3-5 steps with a railing? : A Little 6 Click Score: 18    End of Session Equipment Utilized During Treatment: Gait belt Activity Tolerance: Patient tolerated treatment well Patient left: in chair;with chair alarm set;with call bell/phone within reach        Time: 1530-1555 PT Time Calculation (min) (ACUTE ONLY): 25 min   Charges:   PT Evaluation $PT Eval Low Complexity: 1 Low PT Treatments $Gait Training: 8-22 mins        Verner Mould, DPT Physical Therapist with [facility] [phone]  06/17/2019 7:03 PM

## 2019-06-17 NOTE — Progress Notes (Signed)
Patients daughter, [name], expressed that she had not spoken with the attending physician today and she had many concerns regarding her fathers care plan.  Dr Maren Beach called and updated [name] and answered all questions.  [name] expressed that her father was unable to comprehend and retain all information reported to him by his providers and requested providers call her as well on her Cell.  Lisa's contact info verified in chart.

## 2019-06-17 NOTE — Progress Notes (Addendum)
PROGRESS NOTE    [name]  WUJ:[id number] DOB: 01-28-1932 DOA: 06/14/2019 PCP: [name], MD   Brief Narrative: 86 year old male with history of A. 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  Patient was seen in the ER hemoglobin 7 g down from 9.2 g transfused 1 unit PRBC, GI was consulted.  Patient underwent EGD that was nonrevealing.  Patient continued to have anemia on 2/21 underwent colonoscopy revealing diverticulosis and descending colon and sigmoid colon.  Patient became hypotensive after the procedure but that improved with fluid resuscitation.  Patient also had episode of hypoglycemia, was n.p.o. and was given D50.  Subjective: On bedside chair, having meal Passing flatus- no BM. No acute events overnight.  Blood pressure 128/59, afebrile with T-max 99.  Repeat blood work with hemoglobin 7.7 g. magnesium 1.6.  Assessment & Plan:  Acute blood loss anemia/symptomatic anemia/acute on chronic anemia/melena: Baseline hemoglobin 9 to 10 g.  Suspecting GI bleed status post EGD colonoscopy- unrevealing.  GI on board, continue PPI off Protonix drip.  s/p capsule endoscopy and awaiting on results and further gi eval and hopefully resume xarelto soon. So far 1 unit PRBC  Transfusion. Check h/h, no BM x2-3 days now.  Hypoglycemia: likely from n.p.o./insulin held subcutaneous Lantus. Sugar has stabilized.on po diet.  Hypomagnesemia/hypophosphatemia: we will replete  AKI on CKD stage IIIa likely prerenal.  Resolved.  Diabetic foot ulcer followed by podiatry.  Also with history of Charcot foot, was recently treated for cellulitis.  PAF on Xarelto currently on hold due to GI bleeding.  Not on rate controlling medication.  NHL/stage IV prostate cancer: Currently on maintenance rituximab followed by Dr. Jana Hakim as outpatient and also by urologist  Dr. [name] status post prostatectomy in addition to stenting for obstructive uropathy secondary to bladder mass.  T2DM on long-term insulin with diabetic neuropathy: On Metformin and Toujeo at home.  Continue sliding scale insulin Lantus held due to hypoglycemia.  CAD status post CABG/hyperlipidemia on Lipitor and fenofibrate.  Gout not allopurinol.  OSA: not using CPAP  Possible UTI per daughter and was treated with nitrofurantoin.  Discontinued nitrofurantoin due to renal functions.  UA on 2/20 shows WBC 0-5, and culture was 20,000 gram-negative rods- C/S pending.   Body mass index is 22.83 kg/m.  Pressure Ulcer: Pressure Injury 11/06/18 Foot Right;Mid;Posterior;Lateral Unstageable - Full thickness tissue loss in which the base of the ulcer is covered by slough (yellow, tan, gray, green or brown) and/or eschar (tan, brown or black) in the wound bed. (Active)  11/06/18 0540  Location: Foot  Location Orientation: Right;Mid;Posterior;Lateral  Staging: Unstageable - Full thickness tissue loss in which the base of the ulcer is covered by slough (yellow, tan, gray, green or brown) and/or eschar (tan, brown or black) in the wound bed.  Wound Description (Comments):   Present on Admission: Yes   Updated pt's daughter this evening. He missed iron transfusion appointment last Thursday and daughter requests to have 1 done - will order feraheme here (discussed risks like allergy/anaphylaxis/ as well as benefits- he has tolerated in the past- they are agreeable.   DVT prophylaxis:SCD Code Status: DNR Family Communication: plan of care discussed with patient at bedside. Disposition Plan: Patient is from:HOME Anticipated Disposition: to HOME , 1-2 days Barriers to discharge or conditions that needs to be met prior to discharge:ocne compeltion of gi work  up, had capsule placed 2/21 and signed off by GI. PT eval today.  Consultants: GI Procedures: EGD/colonoscopy Microbiology:see  note  Medications: Scheduled Meds: . allopurinol  50 mg Oral QPC breakfast  . atorvastatin  20 mg Oral QPM  . Chlorhexidine Gluconate Cloth  6 each Topical Daily  . fenofibrate  160 mg Oral QPM  . gabapentin  300 mg Oral TID  . insulin glargine  10 Units Subcutaneous Daily  . lipase/protease/amylase  12,000 Units Oral TID WC  . loratadine  10 mg Oral Daily  . magnesium oxide  400 mg Oral BID  . pantoprazole  40 mg Oral Daily  . phosphorus  250 mg Oral TID  . sertraline  100 mg Oral QPC breakfast   Continuous Infusions: . sodium chloride Stopped (06/14/19 1507)  . sodium chloride 100 mL/hr at 06/17/19 0554    Antimicrobials: Anti-infectives (From admission, onward)   None       Objective: Vitals: Today's Vitals   06/17/19 0000 06/17/19 0337 06/17/19 0400 06/17/19 0600  BP: (!) 124/58  (!) 111/57 (!) 122/59  Pulse: 64  65 63  Resp: 17  17 15   Temp:  99 F (37.2 C)    TempSrc:  Axillary    SpO2: 97%  96% 97%  Weight:      Height:      PainSc: Asleep  Asleep     Intake/Output Summary (Last 24 hours) at 06/17/2019 0719 Last data filed at 06/17/2019 0400 Gross per 24 hour  Intake 1642 ml  Output 1750 ml  Net -108 ml   Filed Weights   06/15/19 0721 06/16/19 0719 06/16/19 1053  Weight: 68.1 kg 68.1 kg 68.1 kg   Weight change: 0 kg   Intake/Output from previous day: 02/21 0701 - 02/22 0700 In: 9767 [P.O.:240; I.V.:1402] Out: 1750 [Urine:1750] Intake/Output this shift: No intake/output data recorded.  Examination:  General exam: AAOx3,NAD, weak appearing. HEENT:Oral mucosa moist, Ear/Nose WNL grossly,dentition normal. Respiratory system: bilaterally clear,no wheezing or crackles,no use of accessory muscle, non tender. Cardiovascular system: S1 & S2 +, regular, No JVD. Gastrointestinal system: Abdomen soft, NT,ND, BS+. Nervous System:Alert, awake, moving extremities and grossly nonfocal Extremities: No edema, distal peripheral pulses palpable.  Skin: No  rashes,no icterus. MSK: Normal muscle bulk,tone, power  Data Reviewed: I have personally reviewed following labs and imaging studies CBC: Recent Labs  Lab 06/14/19 1354 06/14/19 2140 06/15/19 0429 06/16/19 0926 06/17/19 0500  WBC 6.5  --  6.4 6.1 5.8  NEUTROABS 5.5  --   --  5.2 4.9  HGB 7.0* 7.8* 7.4* 8.0* 7.7*  HCT 22.9* 24.6* 23.8* 26.5* 25.1*  MCV 83.6  --  84.4 86.0 85.1  PLT 165  --  147* 169 341*   Basic Metabolic Panel: Recent Labs  Lab 06/14/19 1354 06/15/19 0429 06/16/19 0415 06/17/19 0500  NA 136 138 137 138  K 3.5 3.3* 3.7 3.7  CL 104 108 112* 111  CO2 22 21* 21* 21*  GLUCOSE 167* 88 76 93  BUN 27* 20 12 10   CREATININE 1.39* 1.16 1.01 0.94  CALCIUM 8.6* 8.1* 7.9* 7.9*  MG  --   --  1.7 1.6*  PHOS  --   --  2.0* 2.3*   GFR: Estimated Creatinine Clearance: 55.3 mL/min (by C-G formula based on SCr of 0.94 mg/dL). Liver Function Tests: Recent Labs  Lab 06/14/19 1354 06/16/19 0415 06/17/19 0500  AST 60*  --   --   ALT 30  --   --  ALKPHOS 45  --   --   BILITOT 0.4  --   --   PROT 5.9*  --   --   ALBUMIN 3.0* 2.6* 2.5*   No results for input(s): LIPASE, AMYLASE in the last 168 hours. No results for input(s): AMMONIA in the last 168 hours. Coagulation Profile: Recent Labs  Lab 06/14/19 1354  INR 1.2   Cardiac Enzymes: No results for input(s): CKTOTAL, CKMB, CKMBINDEX, TROPONINI in the last 168 hours. BNP (last 3 results) No results for input(s): PROBNP in the last 8760 hours. HbA1C: No results for input(s): HGBA1C in the last 72 hours. CBG: Recent Labs  Lab 06/16/19 1324 06/16/19 1414 06/16/19 1455 06/16/19 1603 06/16/19 2114  GLUCAP 90 93 115* 110* 167*   Lipid Profile: No results for input(s): CHOL, HDL, LDLCALC, TRIG, CHOLHDL, LDLDIRECT in the last 72 hours. Thyroid Function Tests: No results for input(s): TSH, T4TOTAL, FREET4, T3FREE, THYROIDAB in the last 72 hours. Anemia Panel: No results for input(s): VITAMINB12, FOLATE,  FERRITIN, TIBC, IRON, RETICCTPCT in the last 72 hours. Sepsis Labs: No results for input(s): PROCALCITON, LATICACIDVEN in the last 168 hours.  Recent Results (from the past 240 hour(s))  MRSA PCR Screening     Status: None   Collection Time: 06/14/19  7:07 PM   Specimen: Nasal Mucosa; Nasopharyngeal  Result Value Ref Range Status   MRSA by PCR NEGATIVE NEGATIVE Final    Comment:        The GeneXpert MRSA Assay (FDA approved for NASAL specimens only), is one component of a comprehensive MRSA colonization surveillance program. It is not intended to diagnose MRSA infection nor to guide or monitor treatment for MRSA infections. Performed at [facility], [place] [address], [place], [place] [address]   Culture, Urine     Status: Abnormal (Preliminary result)   Collection Time: 06/15/19 12:41 PM   Specimen: Urine, Random  Result Value Ref Range Status   Specimen Description   Final    URINE, RANDOM Performed at [facility] [address] [place], [place] [address]    Special Requests   Final    NONE Performed at [facility], [place] [address] [place], [place] [address]    Culture (A)  Final    20,000 COLONIES/mL GRAM NEGATIVE RODS IDENTIFICATION AND SUSCEPTIBILITIES TO FOLLOW Performed at [facility], [place] [address], [place], [place] [address]    Report Status PENDING  Incomplete      Radiology Studies: No results found.   LOS: 2 days   Time spent: More than 50% of that time was spent in counseling and/or coordination of care.  Antonieta Pert, MD [facility]  06/17/2019, 7:19 AM

## 2019-06-18 ENCOUNTER — Ambulatory Visit: Payer: Medicare Other | Admitting: Podiatry

## 2019-06-18 LAB — CBC
HCT: 24.8 % — ABNORMAL LOW (ref 39.0–52.0)
Hemoglobin: 7.8 g/dL — ABNORMAL LOW (ref 13.0–17.0)
MCH: 26.6 pg (ref 26.0–34.0)
MCHC: 31.5 g/dL (ref 30.0–36.0)
MCV: 84.6 fL (ref 80.0–100.0)
Platelets: 143 10*3/uL — ABNORMAL LOW (ref 150–400)
RBC: 2.93 MIL/uL — ABNORMAL LOW (ref 4.22–5.81)
RDW: 18.1 % — ABNORMAL HIGH (ref 11.5–15.5)
WBC: 5.3 10*3/uL (ref 4.0–10.5)
nRBC: 0 % (ref 0.0–0.2)

## 2019-06-18 LAB — HEMOGLOBIN AND HEMATOCRIT, BLOOD
HCT: 29.3 % — ABNORMAL LOW (ref 39.0–52.0)
Hemoglobin: 9.3 g/dL — ABNORMAL LOW (ref 13.0–17.0)

## 2019-06-18 LAB — GLUCOSE, CAPILLARY
Glucose-Capillary: 82 mg/dL (ref 70–99)
Glucose-Capillary: 110 mg/dL — ABNORMAL HIGH (ref 70–99)
Glucose-Capillary: 216 mg/dL — ABNORMAL HIGH (ref 70–99)

## 2019-06-18 LAB — URINE CULTURE: Culture: 20000 — AB

## 2019-06-18 LAB — PREPARE RBC (CROSSMATCH)

## 2019-06-18 MED ORDER — SODIUM CHLORIDE 0.9% IV SOLUTION: Freq: Once | INTRAVENOUS | Status: AC

## 2019-06-18 MED ORDER — CIPROFLOXACIN HCL 500 MG PO TABS
500.0000 mg | ORAL_TABLET | Freq: Two times a day (BID) | ORAL | Status: DC
  Administered 2019-06-18: 500 mg via ORAL
  Filled 2019-06-18: qty 1

## 2019-06-18 MED ORDER — CIPROFLOXACIN HCL 500 MG PO TABS: 500.0000 mg | ORAL_TABLET | Freq: Two times a day (BID) | ORAL | 0 refills | Status: AC

## 2019-06-18 MED ORDER — PANTOPRAZOLE SODIUM 40 MG PO TBEC: 40.0000 mg | DELAYED_RELEASE_TABLET | Freq: Every day | ORAL | 0 refills | Status: DC

## 2019-06-18 NOTE — Progress Notes (Signed)
Pt discharged home with daughter in stable condition. Discharge instructions given to patient and daughter. Scripts sent to pharmacy of choice. No immediate questions or concerns at this time. Discharged from unit via wheelchair.

## 2019-06-18 NOTE — Discharge Summary (Signed)
Physician Discharge Summary  [name]:[id number] DOB: March 11, 1932 DOA: 06/14/2019  PCP: [name], MD  Admit date: 06/14/2019 Discharge date: 06/18/2019  Admitted From: home Disposition:  Home  Recommendations for Outpatient Follow-up:  1. Follow up with PCP in 1-2 weeks 2. Please obtain BMP/CBC in one week 3. Please follow up on the following pending results:  Home Health:no  Equipment/Devices: none  Discharge Condition: Stable Code Status: DNR Diet recommendation: Heart Healthy, diabetic  Brief/Interim Summary: 86 year old male with history of A. 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  Patient was seen in the ER hemoglobin 7 g down from 9.2 g transfused 1 unit PRBC, GI was consulted.  Patient underwent EGD that was nonrevealing.  Patient continued to have anemia on 2/21 underwent colonoscopy revealing diverticulosis and descending colon and sigmoid colon.  Patient became hypotensive after the procedure but that improved with fluid resuscitation.  Patient also had episode of hypoglycemia, was n.p.o. and was given D50.  Patient also had capsule placed on 2/21. Since his procedure no further bleeding or black is still noted.  Tolerating diet.  No source of bleeding was found even on capsule.  GI recommended to transfuse 1 unit PRBC 2/23 and resume Xarelto.  hsi chads2vasc score is 5 and he is on Xarelto for his stroke prevention due to atrial fibrillation.  I have discussed about risks benefits alternatives and GI recommendation about resuming Xarelto and monitoring him closely.  He needs to have his CBC checked in 5 to 7 days from his PCP, communicated to patient's daughter.  Also needs follow-up with his cardiology and I have discussed with Dr. Debara Pickett from cardiology will arrange for outpatient follow-up.  Discharge Diagnoses:  Acute  blood loss anemia/symptomatic anemia/acute on chronic anemia/melena: Baseline hemoglobin 9 to 10 g.  Suspecting GI bleed status post EGD colonoscopy capsule-all work up unrevealing.    GI advised to transfuse 1 unit PRBC and resume patient on Xarelto.  Patient is to follow-up with hemoglobin from PCP in 5 to 7 days and GI and with cardiology.  transfused 1 unit PRBC before discharge today.Received 1 unit PRBC previously, and also received iv Feraheme as missed hsi iv feraheme dose last week.  Hypoglycemia:  Resolved.  Continue his home diabetic medications upon discharge.  Monitor sugar at home closely.    Hypomagnesemia/hypophosphatemia: was repleted  AKI on CKD stage IIIa likely prerenal.  Resolved.  Diabetic foot ulcer followed by podiatry.  Also with history of Charcot foot, was recently treated for cellulitis.  PAF on Xarelto- okay to resume as per gi.chads2vasc score is 5 and he is on Xarelto for his stroke prevention due to atrial fibrillation  NHL/stage IV prostate cancer: Currently on maintenance rituximab followed by Dr. Jana Hakim as outpatient and also by urologist Dr. [name] status post prostatectomy in addition to stenting for obstructive uropathy secondary to bladder mass.  T2DM on long-term insulin with diabetic neuropathy:resume home meds on d/c cont ssi while here  CAD status post CABG/hyperlipidemia,on Lipitor and fenofibrate.  Gout not allopurinol.  OSA: not using CPAP  Possible UTI per daughter and was treated with nitrofurantoin.  Discontinued nitrofurantoin due to renal functions.  UA on 2/20 shows WBC 0-5, and culture was 20,000 gram-negative rods-Sarratia and Klebsiella sensitive to  Cipro only and resistant to most other antibiotics.  Patient's daughter reports his prescription was  changed just before he got admitted, it may have been ciprofloxacin.  I discussed about risk benefits especially about black box warning- tendinitis/tendon rupture/mental status  change confusion,and they understand and moving ahead with short course, of 3 days  Body mass index is 22.83 kg/m.  Pressure Ulcer: Pressure Injury 11/06/18 Foot Right;Mid;Posterior;Lateral Unstageable - Full thickness tissue loss in which the base of the ulcer is covered by slough (yellow, tan, gray, green or brown) and/or eschar (tan, brown or black) in the wound bed. (Active)  11/06/18 0540  Location: Foot  Location Orientation: Right;Mid;Posterior;Lateral  Staging: Unstageable - Full thickness tissue loss in which the base of the ulcer is covered by slough (yellow, tan, gray, green or brown) and/or eschar (tan, brown or black) in the wound bed.  Wound Description (Comments):   Present on Admission: Yes   Updated pt's daughter this evening. He missed iron transfusion appointment last Thursday and daughter requests to have 1 done - will order feraheme here (discussed risks like allergy/anaphylaxis/ as well as benefits- he has tolerated in the past- they are agreeable.   DVT prophylaxis:SCD Code Status: DNR Family Communication: plan of care discussed with patient at bedside.  I have discussed plan of care with patient's daughter in detail especially about  follow-up instruction and medication side effects. Dispo- home after prbc transfusion today   Consults:  Gastroenterology  Subjective: Resting well tolerating diet.  No more black stool. Wanting to go home today. Discharge Exam: Vitals:   06/18/19 1210 06/18/19 1429  BP: (!) 105/52 110/62  Pulse: 74 74  Resp: 18   Temp: 99.1 F (37.3 C) 99 F (37.2 C)  SpO2: 94% 96%   General: Pt is alert, awake, not in acute distress Cardiovascular: RRR, S1/S2 +, no rubs, no gallops Respiratory: CTA bilaterally, no wheezing, no rhonchi Abdominal: Soft, NT, ND, bowel sounds + Extremities: no edema, no cyanosis  Discharge Instructions  Discharge Instructions    Diet - low sodium heart healthy   Complete by: As directed     Discharge instructions   Complete by: As directed    Please call call MD or return to ER for similar or worsening recurring problem that brought you to hospital or if any fever,nausea/vomiting,abdominal pain, uncontrolled pain, chest pain,  shortness of breath or any other alarming symptoms.  Please follow-up with your cardiology and discuss about her anticoagulation.  Have your hemoglobin CBC checked next 5 days from your PCP.  Please follow-up your doctor as instructed in a week time and call the office for appointment.  F/u with pcp/gi as instructed.   Please avoid alcohol, smoking, or any other illicit substance and maintain healthy habits including taking your regular medications as prescribed.  You were cared for by a hospitalist during your hospital stay. If you have any questions about your discharge medications or the care you received while you were in the hospital after you are discharged, you can call the unit and ask to speak with the hospitalist on call if the hospitalist that took care of you is not available.  Once you are discharged, your primary care physician will handle any further medical issues. Please note that NO REFILLS for any discharge medications will be authorized once you are discharged, as it is imperative that you return to your primary care physician (or establish a relationship with a primary care physician if you do not have one) for your aftercare needs so that they can reassess your need for medications and monitor  your lab values   Increase activity slowly   Complete by: As directed      Allergies as of 06/18/2019      Reactions   Atenolol Other (See Comments)   "Heart rate slowed- STOPPED on 08/16/2013"   Niacin Other (See Comments)   headaches      Medication List    STOP taking these medications   cephALEXin 500 MG capsule Commonly known as: KEFLEX     TAKE these medications   allopurinol 300 MG tablet Commonly known as: ZYLOPRIM Take 300  mg by mouth daily after breakfast.   atorvastatin 20 MG tablet Commonly known as: LIPITOR Take 20 mg by mouth every evening.   CALCIUM 600/VITAMIN D3 PO Take 1,200 mg by mouth daily.   cetirizine 10 MG tablet Commonly known as: ZYRTEC Take 10 mg by mouth daily.   ciprofloxacin 500 MG tablet Commonly known as: CIPRO Take 1 tablet (500 mg total) by mouth 2 (two) times daily for 5 doses.   dextromethorphan-guaiFENesin 30-600 MG 12hr tablet Commonly known as: MUCINEX DM Take 1 tablet by mouth 2 (two) times daily. What changed:   when to take this  reasons to take this   Droplet Pen Needles 32G X 4 MM Misc Generic drug: Insulin Pen Needle   fenofibrate 160 MG tablet Take 160 mg by mouth every evening.   ferrous sulfate 325 (65 FE) MG tablet Take 1 tablet (325 mg total) by mouth 2 (two) times daily with a meal.   gabapentin 300 MG capsule Commonly known as: NEURONTIN Take 300 mg by mouth 3 (three) times daily.   latanoprost 0.005 % ophthalmic solution Commonly known as: XALATAN Place 1 drop into both eyes at bedtime.   leuprolide 22.5 MG injection Commonly known as: LUPRON Inject 22.5 mg into the muscle every 3 (three) months.   linagliptin 5 MG Tabs tablet Commonly known as: TRADJENTA Take 1 tablet (5 mg total) by mouth daily.   lipase/protease/amylase 12000 units Cpep capsule Commonly known as: CREON Take 12,000 Units by mouth 3 (three) times daily with meals.   magnesium oxide 400 MG tablet Commonly known as: MAG-OX Take 400 mg by mouth 2 (two) times daily.   metFORMIN 500 MG 24 hr tablet Commonly known as: GLUCOPHAGE-XR Take 500 mg by mouth 2 (two) times a day.   mupirocin ointment 2 % Commonly known as: BACTROBAN Apply 1 application topically daily.   nitrofurantoin (macrocrystal-monohydrate) 100 MG capsule Commonly known as: MACROBID Take 100 mg by mouth 2 (two) times daily.   One-A-Day Mens 50+ Advantage Tabs Take 1 tablet by mouth daily with  breakfast.   pantoprazole 40 MG tablet Commonly known as: PROTONIX Take 1 tablet (40 mg total) by mouth daily. Start taking on: June 19, 2019   polyethylene glycol powder 17 GM/SCOOP powder Commonly known as: MiraLax Take 17 g by mouth 2 (two) times daily as needed for moderate constipation.   Rivaroxaban 15 MG Tabs tablet Commonly known as: XARELTO Take 15 mg by mouth daily with lunch.   senna-docusate 8.6-50 MG tablet Commonly known as: Senokot-S Take 1 tablet by mouth 2 (two) times daily between meals as needed for mild constipation.   sertraline 100 MG tablet Commonly known as: ZOLOFT Take 100 mg by mouth daily after breakfast.   Toujeo SoloStar 300 UNIT/ML Sopn Generic drug: Insulin Glargine (1 Unit Dial) Inject 10-28 Units into the skin daily.   Vitamin B-12 2500 MCG Subl Place 2,500 mcg under the tongue every  morning.   vitamin C 500 MG tablet Commonly known as: ASCORBIC ACID Take 500 mg by mouth daily after breakfast.      Follow-up Information    Carol Ada, MD Follow up in 1 week(s).   Specialty: Gastroenterology Contact information: [place], [place] [address] [phone]        [name], MD Follow up in 1 week(s).   Specialty: Internal Medicine Contact information: [address] [place] [address] [phone]        Sanda Klein, MD .   Specialty: Cardiology Contact information: [address] [place] [address] [place] [address] [phone]          Allergies  Allergen Reactions  . Atenolol Other (See Comments)    "Heart rate slowed- STOPPED on 08/16/2013"  . Niacin Other (See Comments)    headaches    The results of significant diagnostics from this hospitalization (including imaging, microbiology, ancillary and laboratory) are listed below for reference.    Microbiology: Recent Results (from the past 240 hour(s))  MRSA PCR Screening     Status: None   Collection  Time: 06/14/19  7:07 PM   Specimen: Nasal Mucosa; Nasopharyngeal  Result Value Ref Range Status   MRSA by PCR NEGATIVE NEGATIVE Final    Comment:        The GeneXpert MRSA Assay (FDA approved for NASAL specimens only), is one component of a comprehensive MRSA colonization surveillance program. It is not intended to diagnose MRSA infection nor to guide or monitor treatment for MRSA infections. Performed at [facility], [place] [address], [place], [place] [address]   Culture, Urine     Status: Abnormal   Collection Time: 06/15/19 12:41 PM   Specimen: Urine, Random  Result Value Ref Range Status   Specimen Description   Final    URINE, RANDOM Performed at [place] [address], [place], [place] [address]    Special Requests   Final    NONE Performed at [facility], [place] [address] [place], [place] [address]    Culture (A)  Final    20,000 COLONIES/mL KLEBSIELLA PNEUMONIAE 30,000 COLONIES/mL SERRATIA MARCESCENS    Report Status 06/18/2019 FINAL  Final   Organism ID, Bacteria SERRATIA MARCESCENS (A)  Final   Organism ID, Bacteria KLEBSIELLA PNEUMONIAE (A)  Final      Susceptibility   Klebsiella pneumoniae - MIC*    AMPICILLIN >=32 RESISTANT Resistant     CEFAZOLIN >=64 RESISTANT Resistant     CEFTRIAXONE 32 RESISTANT Resistant     CIPROFLOXACIN 0.5 SENSITIVE Sensitive     GENTAMICIN <=1 SENSITIVE Sensitive     IMIPENEM <=0.25 SENSITIVE Sensitive     NITROFURANTOIN 64 INTERMEDIATE Intermediate     TRIMETH/SULFA >=320 RESISTANT Resistant     AMPICILLIN/SULBACTAM >=32 RESISTANT Resistant     PIP/TAZO <=4 SENSITIVE Sensitive     * 20,000 COLONIES/mL KLEBSIELLA PNEUMONIAE   Serratia marcescens - MIC*    CEFAZOLIN >=64 RESISTANT Resistant     CEFTRIAXONE 32 RESISTANT Resistant     CIPROFLOXACIN 1 SENSITIVE Sensitive     GENTAMICIN <=1 SENSITIVE Sensitive     NITROFURANTOIN RESISTANT Resistant     TRIMETH/SULFA  >=320 RESISTANT Resistant     * 30,000 COLONIES/mL SERRATIA MARCESCENS    Procedures/Studies: No results found.  Labs: BNP (last 3 results) No results for input(s): BNP in the last 8760 hours. Basic Metabolic Panel: Recent Labs  Lab 06/14/19 1354  06/15/19 0429 06/16/19 0415 06/17/19 0500  NA 136 138 137 138  K 3.5 3.3* 3.7 3.7  CL 104 108 112* 111  CO2 22 21* 21* 21*  GLUCOSE 167* 88 76 93  BUN 27* 20 12 10   CREATININE 1.39* 1.16 1.01 0.94  CALCIUM 8.6* 8.1* 7.9* 7.9*  MG  --   --  1.7 1.6*  PHOS  --   --  2.0* 2.3*   Liver Function Tests: Recent Labs  Lab 06/14/19 1354 06/16/19 0415 06/17/19 0500  AST 60*  --   --   ALT 30  --   --   ALKPHOS 45  --   --   BILITOT 0.4  --   --   PROT 5.9*  --   --   ALBUMIN 3.0* 2.6* 2.5*   No results for input(s): LIPASE, AMYLASE in the last 168 hours. No results for input(s): AMMONIA in the last 168 hours. CBC: Recent Labs  Lab 06/14/19 1354 06/14/19 2140 06/15/19 0429 06/16/19 0926 06/17/19 0500 06/17/19 1500 06/18/19 0530  WBC 6.5  --  6.4 6.1 5.8  --  5.3  NEUTROABS 5.5  --   --  5.2 4.9  --   --   HGB 7.0*   < > 7.4* 8.0* 7.7* 10.1* 7.8*  HCT 22.9*   < > 23.8* 26.5* 25.1* 33.1* 24.8*  MCV 83.6  --  84.4 86.0 85.1  --  84.6  PLT 165  --  147* 169 136*  --  143*   < > = values in this interval not displayed.   Cardiac Enzymes: No results for input(s): CKTOTAL, CKMB, CKMBINDEX, TROPONINI in the last 168 hours. BNP: Invalid input(s): POCBNP CBG: Recent Labs  Lab 06/17/19 1214 06/17/19 1619 06/17/19 2101 06/18/19 0822 06/18/19 1211  GLUCAP 119* 219* 184* 82 110*   D-Dimer No results for input(s): DDIMER in the last 72 hours. Hgb A1c No results for input(s): HGBA1C in the last 72 hours. Lipid Profile No results for input(s): CHOL, HDL, LDLCALC, TRIG, CHOLHDL, LDLDIRECT in the last 72 hours. Thyroid function studies No results for input(s): TSH, T4TOTAL, T3FREE, THYROIDAB in the last 72 hours.  Invalid  input(s): FREET3 Anemia work up No results for input(s): VITAMINB12, FOLATE, FERRITIN, TIBC, IRON, RETICCTPCT in the last 72 hours. Urinalysis    Component Value Date/Time   COLORURINE YELLOW 06/15/2019 1241   APPEARANCEUR CLEAR 06/15/2019 1241   LABSPEC 1.011 06/15/2019 1241   LABSPEC 1.020 04/24/2015 0846   PHURINE 6.0 06/15/2019 1241   GLUCOSEU 50 (A) 06/15/2019 1241   GLUCOSEU 250 04/24/2015 0846   HGBUR NEGATIVE 06/15/2019 1241   BILIRUBINUR NEGATIVE 06/15/2019 1241   BILIRUBINUR Negative 04/24/2015 0846   [place] 06/15/2019 1241   PROTEINUR 30 (A) 06/15/2019 1241   UROBILINOGEN 0.2 04/24/2015 0846   NITRITE NEGATIVE 06/15/2019 1241   LEUKOCYTESUR NEGATIVE 06/15/2019 1241   LEUKOCYTESUR Negative 04/24/2015 0846   Sepsis Labs Invalid input(s): PROCALCITONIN,  WBC,  LACTICIDVEN Microbiology Recent Results (from the past 240 hour(s))  MRSA PCR Screening     Status: None   Collection Time: 06/14/19  7:07 PM   Specimen: Nasal Mucosa; Nasopharyngeal  Result Value Ref Range Status   MRSA by PCR NEGATIVE NEGATIVE Final    Comment:        The GeneXpert MRSA Assay (FDA approved for NASAL specimens only), is one component of a comprehensive MRSA colonization surveillance program. It is not intended to diagnose MRSA infection nor to guide  or monitor treatment for MRSA infections. Performed at [facility], [place] [address], [place], [place] [address]   Culture, Urine     Status: Abnormal   Collection Time: 06/15/19 12:41 PM   Specimen: Urine, Random  Result Value Ref Range Status   Specimen Description   Final    URINE, RANDOM Performed at [place] [address], [place], [place] [address]    Special Requests   Final    NONE Performed at [facility], [place] [address], [place], [place] [address]    Culture (A)  Final    20,000 COLONIES/mL KLEBSIELLA PNEUMONIAE 30,000 COLONIES/mL SERRATIA  MARCESCENS    Report Status 06/18/2019 FINAL  Final   Organism ID, Bacteria SERRATIA MARCESCENS (A)  Final   Organism ID, Bacteria KLEBSIELLA PNEUMONIAE (A)  Final      Susceptibility   Klebsiella pneumoniae - MIC*    AMPICILLIN >=32 RESISTANT Resistant     CEFAZOLIN >=64 RESISTANT Resistant     CEFTRIAXONE 32 RESISTANT Resistant     CIPROFLOXACIN 0.5 SENSITIVE Sensitive     GENTAMICIN <=1 SENSITIVE Sensitive     IMIPENEM <=0.25 SENSITIVE Sensitive     NITROFURANTOIN 64 INTERMEDIATE Intermediate     TRIMETH/SULFA >=320 RESISTANT Resistant     AMPICILLIN/SULBACTAM >=32 RESISTANT Resistant     PIP/TAZO <=4 SENSITIVE Sensitive     * 20,000 COLONIES/mL KLEBSIELLA PNEUMONIAE   Serratia marcescens - MIC*    CEFAZOLIN >=64 RESISTANT Resistant     CEFTRIAXONE 32 RESISTANT Resistant     CIPROFLOXACIN 1 SENSITIVE Sensitive     GENTAMICIN <=1 SENSITIVE Sensitive     NITROFURANTOIN RESISTANT Resistant     TRIMETH/SULFA >=320 RESISTANT Resistant     * 30,000 COLONIES/mL SERRATIA MARCESCENS     Time coordinating discharge: 35 minutes  SIGNED: Antonieta Pert, MD  [facility] 06/18/2019, 3:34 PM  If 7PM-7AM, please contact night-coverage [email or web address]

## 2019-06-18 NOTE — Care Management Important Message (Signed)
Important Message  Patient Details IM Letter given to Marney Doctor RN Case Manager to present to the Patient Name: [name] MRN: [id number] Date of Birth: 06/28/1931   Medicare Important Message Given:  Yes     Kerin Salen 06/18/2019, 9:54 AM

## 2019-06-18 NOTE — Progress Notes (Signed)
Subjective: No complaints.  Objective: Vital signs in last 24 hours: Temp:  [97.7 F (36.5 C)-99 F (37.2 C)] 98.5 F (36.9 C) (02/23 0558) Pulse Rate:  [66-82] 70 (02/23 0558) Resp:  [16-23] 16 (02/23 0558) BP: (108-145)/(52-69) 113/66 (02/23 0558) SpO2:  [94 %-99 %] 94 % (02/23 0558) Last BM Date: 06/17/19  Intake/Output from previous day: 02/22 0701 - 02/23 0700 In: 2650.7 [P.O.:600; I.V.:1952.9; IV Piggyback:97.8] Out: 2300 [Urine:2300] Intake/Output this shift: Total I/O In: 120 [P.O.:120] Out: 1200 [Urine:1200]  General appearance: alert and no distress GI: soft, non-tender; bowel sounds normal; no masses,  no organomegaly  Lab Results: Recent Labs    06/16/19 0926 06/16/19 0926 06/17/19 0500 06/17/19 1500 06/18/19 0530  WBC 6.1  --  5.8  --  5.3  HGB 8.0*   < > 7.7* 10.1* 7.8*  HCT 26.5*   < > 25.1* 33.1* 24.8*  PLT 169  --  136*  --  143*   < > = values in this interval not displayed.   BMET Recent Labs    06/16/19 0415 06/17/19 0500  NA 137 138  K 3.7 3.7  CL 112* 111  CO2 21* 21*  GLUCOSE 76 93  BUN 12 10  CREATININE 1.01 0.94  CALCIUM 7.9* 7.9*   LFT Recent Labs    06/17/19 0500  ALBUMIN 2.5*   PT/INR No results for input(s): LABPROT, INR in the last 72 hours. Hepatitis Panel No results for input(s): HEPBSAG, HCVAB, HEPAIGM, HEPBIGM in the last 72 hours. C-Diff No results for input(s): CDIFFTOX in the last 72 hours. Fecal Lactopherrin No results for input(s): FECLLACTOFRN in the last 72 hours.  Studies/Results: No results found.  Medications:  Scheduled: . allopurinol  50 mg Oral QPC breakfast  . atorvastatin  20 mg Oral QPM  . Chlorhexidine Gluconate Cloth  6 each Topical Daily  . fenofibrate  160 mg Oral QPM  . gabapentin  300 mg Oral TID  . insulin glargine  10 Units Subcutaneous Daily  . lipase/protease/amylase  12,000 Units Oral TID WC  . loratadine  10 mg Oral Daily  . magnesium oxide  400 mg Oral BID  . pantoprazole   40 mg Oral Daily  . phosphorus  250 mg Oral TID  . sertraline  100 mg Oral QPC breakfast   Continuous: . sodium chloride Stopped (06/14/19 1507)    Assessment/Plan: 1) GI bleed - unknown origin. 2) Afib.   The patient's HGB is stable.  The GI work up is negative for a source of bleeding.  He should be restarted back on Xarelto, but his bleeding may recur.  The benefits of being on anticoagulation outweigh the risks at this time.  Because his has multiple medical issues, he will benefit to be transfused to allow for a buffer for any rebleeding on Xarelto.  The HGB yesterday at 3 PM, documenting 10.1, is a spurious value.  Plan: 1) Resume Xarelto. 2) Recommend 1-2 units of PRBC. 3) Follow up in the office in 1 week.  LOS: 3 days   [name] 06/18/2019, 6:20 AM

## 2019-06-19 LAB — TYPE AND SCREEN
ABO/RH(D): A POS
Antibody Screen: NEGATIVE
Unit division: 0

## 2019-06-19 LAB — BPAM RBC
Blood Product Expiration Date: 202103242359
ISSUE DATE / TIME: 202102231151
Unit Type and Rh: 6200

## 2019-06-21 DIAGNOSIS — R310 Gross hematuria: Secondary | ICD-10-CM | POA: Diagnosis not present

## 2019-06-21 DIAGNOSIS — C61 Malignant neoplasm of prostate: Secondary | ICD-10-CM | POA: Diagnosis not present

## 2019-06-21 DIAGNOSIS — N131 Hydronephrosis with ureteral stricture, not elsewhere classified: Secondary | ICD-10-CM | POA: Diagnosis not present

## 2019-06-25 ENCOUNTER — Other Ambulatory Visit: Payer: Self-pay | Admitting: Urology

## 2019-06-25 ENCOUNTER — Ambulatory Visit: Payer: Medicare Other | Admitting: Podiatry

## 2019-06-25 DIAGNOSIS — Z79899 Other long term (current) drug therapy: Secondary | ICD-10-CM | POA: Diagnosis not present

## 2019-06-25 DIAGNOSIS — R197 Diarrhea, unspecified: Secondary | ICD-10-CM | POA: Diagnosis not present

## 2019-06-25 DIAGNOSIS — Z09 Encounter for follow-up examination after completed treatment for conditions other than malignant neoplasm: Secondary | ICD-10-CM | POA: Diagnosis not present

## 2019-06-25 DIAGNOSIS — I10 Essential (primary) hypertension: Secondary | ICD-10-CM | POA: Diagnosis not present

## 2019-06-25 DIAGNOSIS — E782 Mixed hyperlipidemia: Secondary | ICD-10-CM | POA: Diagnosis not present

## 2019-06-25 DIAGNOSIS — I4891 Unspecified atrial fibrillation: Secondary | ICD-10-CM | POA: Diagnosis not present

## 2019-06-25 DIAGNOSIS — D500 Iron deficiency anemia secondary to blood loss (chronic): Secondary | ICD-10-CM | POA: Diagnosis not present

## 2019-06-25 DIAGNOSIS — E119 Type 2 diabetes mellitus without complications: Secondary | ICD-10-CM | POA: Diagnosis not present

## 2019-07-01 ENCOUNTER — Inpatient Hospital Stay (HOSPITAL_COMMUNITY)
Admission: EM | Admit: 2019-07-01 | Discharge: 2019-07-03 | DRG: 918 | Disposition: A | Discharge status: Home Health | Payer: Medicare Other | Attending: Internal Medicine | Admitting: Internal Medicine

## 2019-07-01 ENCOUNTER — Other Ambulatory Visit: Payer: Self-pay

## 2019-07-01 ENCOUNTER — Other Ambulatory Visit: Payer: Self-pay | Admitting: Oncology

## 2019-07-01 ENCOUNTER — Encounter (HOSPITAL_COMMUNITY): Payer: Self-pay

## 2019-07-01 DIAGNOSIS — D509 Iron deficiency anemia, unspecified: Secondary | ICD-10-CM | POA: Diagnosis present

## 2019-07-01 DIAGNOSIS — Z03818 Encounter for observation for suspected exposure to other biological agents ruled out: Secondary | ICD-10-CM | POA: Diagnosis not present

## 2019-07-01 DIAGNOSIS — E785 Hyperlipidemia, unspecified: Secondary | ICD-10-CM | POA: Diagnosis present

## 2019-07-01 DIAGNOSIS — M1990 Unspecified osteoarthritis, unspecified site: Secondary | ICD-10-CM | POA: Diagnosis present

## 2019-07-01 DIAGNOSIS — Z7901 Long term (current) use of anticoagulants: Secondary | ICD-10-CM

## 2019-07-01 DIAGNOSIS — Z20822 Contact with and (suspected) exposure to covid-19: Secondary | ICD-10-CM | POA: Diagnosis present

## 2019-07-01 DIAGNOSIS — I10 Essential (primary) hypertension: Secondary | ICD-10-CM | POA: Diagnosis present

## 2019-07-01 DIAGNOSIS — E1140 Type 2 diabetes mellitus with diabetic neuropathy, unspecified: Secondary | ICD-10-CM | POA: Diagnosis present

## 2019-07-01 DIAGNOSIS — D62 Acute posthemorrhagic anemia: Secondary | ICD-10-CM | POA: Diagnosis present

## 2019-07-01 DIAGNOSIS — I495 Sick sinus syndrome: Secondary | ICD-10-CM | POA: Diagnosis present

## 2019-07-01 DIAGNOSIS — C61 Malignant neoplasm of prostate: Secondary | ICD-10-CM | POA: Diagnosis present

## 2019-07-01 DIAGNOSIS — Z888 Allergy status to other drugs, medicaments and biological substances status: Secondary | ICD-10-CM

## 2019-07-01 DIAGNOSIS — T45512A Poisoning by anticoagulants, intentional self-harm, initial encounter: Principal | ICD-10-CM | POA: Diagnosis present

## 2019-07-01 DIAGNOSIS — G4733 Obstructive sleep apnea (adult) (pediatric): Secondary | ICD-10-CM | POA: Diagnosis present

## 2019-07-01 DIAGNOSIS — C8590 Non-Hodgkin lymphoma, unspecified, unspecified site: Secondary | ICD-10-CM | POA: Diagnosis present

## 2019-07-01 DIAGNOSIS — R4189 Other symptoms and signs involving cognitive functions and awareness: Secondary | ICD-10-CM | POA: Diagnosis present

## 2019-07-01 DIAGNOSIS — T50901A Poisoning by unspecified drugs, medicaments and biological substances, accidental (unintentional), initial encounter: Secondary | ICD-10-CM | POA: Diagnosis not present

## 2019-07-01 DIAGNOSIS — Z951 Presence of aortocoronary bypass graft: Secondary | ICD-10-CM

## 2019-07-01 DIAGNOSIS — K219 Gastro-esophageal reflux disease without esophagitis: Secondary | ICD-10-CM | POA: Diagnosis present

## 2019-07-01 DIAGNOSIS — I5042 Chronic combined systolic (congestive) and diastolic (congestive) heart failure: Secondary | ICD-10-CM | POA: Diagnosis present

## 2019-07-01 DIAGNOSIS — I130 Hypertensive heart and chronic kidney disease with heart failure and stage 1 through stage 4 chronic kidney disease, or unspecified chronic kidney disease: Secondary | ICD-10-CM | POA: Diagnosis present

## 2019-07-01 DIAGNOSIS — K922 Gastrointestinal hemorrhage, unspecified: Secondary | ICD-10-CM | POA: Diagnosis not present

## 2019-07-01 DIAGNOSIS — K625 Hemorrhage of anus and rectum: Secondary | ICD-10-CM

## 2019-07-01 DIAGNOSIS — E1122 Type 2 diabetes mellitus with diabetic chronic kidney disease: Secondary | ICD-10-CM | POA: Diagnosis present

## 2019-07-01 DIAGNOSIS — Z8505 Personal history of malignant neoplasm of liver: Secondary | ICD-10-CM

## 2019-07-01 DIAGNOSIS — Z8249 Family history of ischemic heart disease and other diseases of the circulatory system: Secondary | ICD-10-CM

## 2019-07-01 DIAGNOSIS — E119 Type 2 diabetes mellitus without complications: Secondary | ICD-10-CM

## 2019-07-01 DIAGNOSIS — E1129 Type 2 diabetes mellitus with other diabetic kidney complication: Secondary | ICD-10-CM | POA: Diagnosis present

## 2019-07-01 DIAGNOSIS — I2510 Atherosclerotic heart disease of native coronary artery without angina pectoris: Secondary | ICD-10-CM | POA: Diagnosis present

## 2019-07-01 DIAGNOSIS — M109 Gout, unspecified: Secondary | ICD-10-CM | POA: Diagnosis present

## 2019-07-01 DIAGNOSIS — T503X2A Poisoning by electrolytic, caloric and water-balance agents, intentional self-harm, initial encounter: Secondary | ICD-10-CM | POA: Diagnosis present

## 2019-07-01 DIAGNOSIS — C4490 Unspecified malignant neoplasm of skin, unspecified: Secondary | ICD-10-CM | POA: Diagnosis present

## 2019-07-01 DIAGNOSIS — I700 Atherosclerosis of aorta: Secondary | ICD-10-CM | POA: Diagnosis present

## 2019-07-01 DIAGNOSIS — Z66 Do not resuscitate: Secondary | ICD-10-CM | POA: Diagnosis present

## 2019-07-01 DIAGNOSIS — R269 Unspecified abnormalities of gait and mobility: Secondary | ICD-10-CM | POA: Diagnosis present

## 2019-07-01 DIAGNOSIS — N183 Chronic kidney disease, stage 3 unspecified: Secondary | ICD-10-CM | POA: Diagnosis present

## 2019-07-01 DIAGNOSIS — Z794 Long term (current) use of insulin: Secondary | ICD-10-CM

## 2019-07-01 DIAGNOSIS — R195 Other fecal abnormalities: Secondary | ICD-10-CM | POA: Diagnosis not present

## 2019-07-01 DIAGNOSIS — Z8546 Personal history of malignant neoplasm of prostate: Secondary | ICD-10-CM

## 2019-07-01 DIAGNOSIS — I480 Paroxysmal atrial fibrillation: Secondary | ICD-10-CM | POA: Diagnosis present

## 2019-07-01 DIAGNOSIS — T56892A Toxic effect of other metals, intentional self-harm, initial encounter: Secondary | ICD-10-CM | POA: Diagnosis present

## 2019-07-01 DIAGNOSIS — Z809 Family history of malignant neoplasm, unspecified: Secondary | ICD-10-CM

## 2019-07-01 DIAGNOSIS — R531 Weakness: Secondary | ICD-10-CM | POA: Diagnosis not present

## 2019-07-01 DIAGNOSIS — D696 Thrombocytopenia, unspecified: Secondary | ICD-10-CM | POA: Diagnosis present

## 2019-07-01 DIAGNOSIS — H9190 Unspecified hearing loss, unspecified ear: Secondary | ICD-10-CM | POA: Diagnosis present

## 2019-07-01 LAB — CBC WITH DIFFERENTIAL/PLATELET
Abs Immature Granulocytes: 0.66 10*3/uL — ABNORMAL HIGH (ref 0.00–0.07)
Basophils Absolute: 0 10*3/uL (ref 0.0–0.1)
Basophils Relative: 1 %
Eosinophils Absolute: 0.1 10*3/uL (ref 0.0–0.5)
Eosinophils Relative: 1 %
HCT: 31.1 % — ABNORMAL LOW (ref 39.0–52.0)
Hemoglobin: 9.7 g/dL — ABNORMAL LOW (ref 13.0–17.0)
Immature Granulocytes: 8 %
Lymphocytes Relative: 2 %
Lymphs Abs: 0.2 10*3/uL — ABNORMAL LOW (ref 0.7–4.0)
MCH: 26.6 pg (ref 26.0–34.0)
MCHC: 31.2 g/dL (ref 30.0–36.0)
MCV: 85.2 fL (ref 80.0–100.0)
Monocytes Absolute: 0.5 10*3/uL (ref 0.1–1.0)
Monocytes Relative: 6 %
Neutro Abs: 6.7 10*3/uL (ref 1.7–7.7)
Neutrophils Relative %: 82 %
Platelets: 182 10*3/uL (ref 150–400)
RBC: 3.65 MIL/uL — ABNORMAL LOW (ref 4.22–5.81)
RDW: 18.6 % — ABNORMAL HIGH (ref 11.5–15.5)
WBC: 8.1 10*3/uL (ref 4.0–10.5)
nRBC: 0 % (ref 0.0–0.2)

## 2019-07-01 LAB — COMPREHENSIVE METABOLIC PANEL
ALT: 41 U/L (ref 0–44)
AST: 59 U/L — ABNORMAL HIGH (ref 15–41)
Albumin: 3.1 g/dL — ABNORMAL LOW (ref 3.5–5.0)
Alkaline Phosphatase: 62 U/L (ref 38–126)
Anion gap: 8 (ref 5–15)
BUN: 19 mg/dL (ref 8–23)
CO2: 24 mmol/L (ref 22–32)
Calcium: 9.4 mg/dL (ref 8.9–10.3)
Chloride: 104 mmol/L (ref 98–111)
Creatinine, Ser: 1.1 mg/dL (ref 0.61–1.24)
GFR calc Af Amer: >60 mL/min (ref >60)
GFR calc non Af Amer: >60 mL/min (ref >60)
Glucose, Bld: 249 mg/dL — ABNORMAL HIGH (ref 70–99)
Potassium: 3.2 mmol/L — ABNORMAL LOW (ref 3.5–5.1)
Sodium: 136 mmol/L (ref 135–145)
Total Bilirubin: 0.5 mg/dL (ref 0.3–1.2)
Total Protein: 6.2 g/dL — ABNORMAL LOW (ref 6.5–8.1)

## 2019-07-01 LAB — PROTIME-INR
INR: 2.5 — ABNORMAL HIGH (ref 0.8–1.2)
Prothrombin Time: 26.8 seconds — ABNORMAL HIGH (ref 11.4–15.2)

## 2019-07-01 LAB — POC OCCULT BLOOD, ED: Fecal Occult Bld: NEGATIVE

## 2019-07-01 NOTE — ED Triage Notes (Addendum)
Pt brought in by daughter. Pt has taken 3 xareltos (45mg  total), as well as 6 calcium pills (1800mg  calcium), 6 magnesium (1200mg  total). Pt took 3x the regular amount. Unintentional OD. Pt had first dose at 34, brother saw dose at noon, 3rd dose sometime after that.  Pt was seen 2 weeks ago for GI bleed, had endoscopy today. Pt was + for blood in stool today.
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
## 2019-07-01 NOTE — ED Notes (Signed)
Updated poison control of patients information and status. They recommended to watch his H & H and vital signs through the night.

## 2019-07-02 ENCOUNTER — Encounter (HOSPITAL_COMMUNITY): Payer: Self-pay | Admitting: Internal Medicine

## 2019-07-02 DIAGNOSIS — K625 Hemorrhage of anus and rectum: Secondary | ICD-10-CM | POA: Diagnosis not present

## 2019-07-02 DIAGNOSIS — I2510 Atherosclerotic heart disease of native coronary artery without angina pectoris: Secondary | ICD-10-CM | POA: Diagnosis present

## 2019-07-02 DIAGNOSIS — G4733 Obstructive sleep apnea (adult) (pediatric): Secondary | ICD-10-CM | POA: Diagnosis present

## 2019-07-02 DIAGNOSIS — T45512A Poisoning by anticoagulants, intentional self-harm, initial encounter: Secondary | ICD-10-CM | POA: Diagnosis present

## 2019-07-02 DIAGNOSIS — I480 Paroxysmal atrial fibrillation: Secondary | ICD-10-CM | POA: Diagnosis not present

## 2019-07-02 DIAGNOSIS — T503X2A Poisoning by electrolytic, caloric and water-balance agents, intentional self-harm, initial encounter: Secondary | ICD-10-CM | POA: Diagnosis present

## 2019-07-02 DIAGNOSIS — E785 Hyperlipidemia, unspecified: Secondary | ICD-10-CM | POA: Diagnosis present

## 2019-07-02 DIAGNOSIS — T50901A Poisoning by unspecified drugs, medicaments and biological substances, accidental (unintentional), initial encounter: Secondary | ICD-10-CM | POA: Diagnosis not present

## 2019-07-02 DIAGNOSIS — T56892A Toxic effect of other metals, intentional self-harm, initial encounter: Secondary | ICD-10-CM | POA: Diagnosis present

## 2019-07-02 DIAGNOSIS — M109 Gout, unspecified: Secondary | ICD-10-CM | POA: Diagnosis present

## 2019-07-02 DIAGNOSIS — E1140 Type 2 diabetes mellitus with diabetic neuropathy, unspecified: Secondary | ICD-10-CM | POA: Diagnosis present

## 2019-07-02 DIAGNOSIS — C4490 Unspecified malignant neoplasm of skin, unspecified: Secondary | ICD-10-CM | POA: Diagnosis present

## 2019-07-02 DIAGNOSIS — I130 Hypertensive heart and chronic kidney disease with heart failure and stage 1 through stage 4 chronic kidney disease, or unspecified chronic kidney disease: Secondary | ICD-10-CM | POA: Diagnosis present

## 2019-07-02 DIAGNOSIS — Z951 Presence of aortocoronary bypass graft: Secondary | ICD-10-CM | POA: Diagnosis not present

## 2019-07-02 DIAGNOSIS — Z66 Do not resuscitate: Secondary | ICD-10-CM | POA: Diagnosis present

## 2019-07-02 DIAGNOSIS — K922 Gastrointestinal hemorrhage, unspecified: Secondary | ICD-10-CM | POA: Diagnosis present

## 2019-07-02 DIAGNOSIS — K219 Gastro-esophageal reflux disease without esophagitis: Secondary | ICD-10-CM | POA: Diagnosis present

## 2019-07-02 DIAGNOSIS — I5042 Chronic combined systolic (congestive) and diastolic (congestive) heart failure: Secondary | ICD-10-CM | POA: Diagnosis present

## 2019-07-02 DIAGNOSIS — H9190 Unspecified hearing loss, unspecified ear: Secondary | ICD-10-CM | POA: Diagnosis present

## 2019-07-02 DIAGNOSIS — E1122 Type 2 diabetes mellitus with diabetic chronic kidney disease: Secondary | ICD-10-CM | POA: Diagnosis present

## 2019-07-02 DIAGNOSIS — I10 Essential (primary) hypertension: Secondary | ICD-10-CM | POA: Diagnosis not present

## 2019-07-02 DIAGNOSIS — I700 Atherosclerosis of aorta: Secondary | ICD-10-CM | POA: Diagnosis present

## 2019-07-02 DIAGNOSIS — Z20822 Contact with and (suspected) exposure to covid-19: Secondary | ICD-10-CM | POA: Diagnosis present

## 2019-07-02 DIAGNOSIS — Z8505 Personal history of malignant neoplasm of liver: Secondary | ICD-10-CM | POA: Diagnosis not present

## 2019-07-02 DIAGNOSIS — D62 Acute posthemorrhagic anemia: Secondary | ICD-10-CM

## 2019-07-02 DIAGNOSIS — N183 Chronic kidney disease, stage 3 unspecified: Secondary | ICD-10-CM | POA: Diagnosis present

## 2019-07-02 DIAGNOSIS — C8590 Non-Hodgkin lymphoma, unspecified, unspecified site: Secondary | ICD-10-CM | POA: Diagnosis present

## 2019-07-02 LAB — CBC
HCT: 27.3 % — ABNORMAL LOW (ref 39.0–52.0)
HCT: 29.2 % — ABNORMAL LOW (ref 39.0–52.0)
HCT: 28.4 % — ABNORMAL LOW (ref 39.0–52.0)
Hemoglobin: 8.6 g/dL — ABNORMAL LOW (ref 13.0–17.0)
Hemoglobin: 9 g/dL — ABNORMAL LOW (ref 13.0–17.0)
Hemoglobin: 8.7 g/dL — ABNORMAL LOW (ref 13.0–17.0)
MCH: 26.6 pg (ref 26.0–34.0)
MCH: 26.3 pg (ref 26.0–34.0)
MCH: 26.2 pg (ref 26.0–34.0)
MCHC: 31.5 g/dL (ref 30.0–36.0)
MCHC: 30.8 g/dL (ref 30.0–36.0)
MCHC: 30.6 g/dL (ref 30.0–36.0)
MCV: 84.5 fL (ref 80.0–100.0)
MCV: 85.4 fL (ref 80.0–100.0)
MCV: 85.5 fL (ref 80.0–100.0)
Platelets: 128 10*3/uL — ABNORMAL LOW (ref 150–400)
Platelets: 139 10*3/uL — ABNORMAL LOW (ref 150–400)
Platelets: 144 10*3/uL — ABNORMAL LOW (ref 150–400)
RBC: 3.23 MIL/uL — ABNORMAL LOW (ref 4.22–5.81)
RBC: 3.42 MIL/uL — ABNORMAL LOW (ref 4.22–5.81)
RBC: 3.32 MIL/uL — ABNORMAL LOW (ref 4.22–5.81)
RDW: 18.4 % — ABNORMAL HIGH (ref 11.5–15.5)
RDW: 18.6 % — ABNORMAL HIGH (ref 11.5–15.5)
RDW: 18.5 % — ABNORMAL HIGH (ref 11.5–15.5)
WBC: 6.2 10*3/uL (ref 4.0–10.5)
WBC: 7.2 10*3/uL (ref 4.0–10.5)
WBC: 6.8 10*3/uL (ref 4.0–10.5)
nRBC: 0 % (ref 0.0–0.2)
nRBC: 0 % (ref 0.0–0.2)
nRBC: 0 % (ref 0.0–0.2)

## 2019-07-02 LAB — CBG MONITORING, ED
Glucose-Capillary: 135 mg/dL — ABNORMAL HIGH (ref 70–99)
Glucose-Capillary: 145 mg/dL — ABNORMAL HIGH (ref 70–99)
Glucose-Capillary: 145 mg/dL — ABNORMAL HIGH (ref 70–99)
Glucose-Capillary: 115 mg/dL — ABNORMAL HIGH (ref 70–99)

## 2019-07-02 LAB — BASIC METABOLIC PANEL
Anion gap: 8 (ref 5–15)
BUN: 17 mg/dL (ref 8–23)
CO2: 24 mmol/L (ref 22–32)
Calcium: 9 mg/dL (ref 8.9–10.3)
Chloride: 106 mmol/L (ref 98–111)
Creatinine, Ser: 0.96 mg/dL (ref 0.61–1.24)
GFR calc Af Amer: >60 mL/min (ref >60)
GFR calc non Af Amer: >60 mL/min (ref >60)
Glucose, Bld: 154 mg/dL — ABNORMAL HIGH (ref 70–99)
Potassium: 3.3 mmol/L — ABNORMAL LOW (ref 3.5–5.1)
Sodium: 138 mmol/L (ref 135–145)

## 2019-07-02 LAB — TYPE AND SCREEN
ABO/RH(D): A POS
Antibody Screen: NEGATIVE

## 2019-07-02 LAB — SARS CORONAVIRUS 2 (TAT 6-24 HRS): SARS Coronavirus 2: NEGATIVE

## 2019-07-02 LAB — GLUCOSE, CAPILLARY
Glucose-Capillary: 141 mg/dL — ABNORMAL HIGH (ref 70–99)
Glucose-Capillary: 289 mg/dL — ABNORMAL HIGH (ref 70–99)

## 2019-07-02 LAB — MAGNESIUM: Magnesium: 1.8 mg/dL (ref 1.7–2.4)

## 2019-07-02 MED ORDER — INSULIN ASPART 100 UNIT/ML ~~LOC~~ SOLN
0.0000 [IU] | SUBCUTANEOUS | Status: DC
  Administered 2019-07-03: 02:00:00 3 [IU] via SUBCUTANEOUS
  Administered 2019-07-03: 12:00:00 1 [IU] via SUBCUTANEOUS
  Filled 2019-07-02: qty 0.06

## 2019-07-02 MED ORDER — POTASSIUM CHLORIDE CRYS ER 20 MEQ PO TBCR
40.0000 meq | EXTENDED_RELEASE_TABLET | Freq: Once | ORAL | Status: AC
  Administered 2019-07-02: 11:00:00 40 meq via ORAL
  Filled 2019-07-02: qty 2

## 2019-07-02 MED ORDER — INSULIN GLARGINE 100 UNIT/ML ~~LOC~~ SOLN
10.0000 [IU] | Freq: Every day | SUBCUTANEOUS | Status: DC
  Administered 2019-07-03: 09:00:00 10 [IU] via SUBCUTANEOUS
  Filled 2019-07-02 (×2): qty 0.1

## 2019-07-02 MED ORDER — POTASSIUM CHLORIDE CRYS ER 20 MEQ PO TBCR
40.0000 meq | EXTENDED_RELEASE_TABLET | Freq: Once | ORAL | Status: AC
  Administered 2019-07-02: 02:00:00 40 meq via ORAL
  Filled 2019-07-02: qty 2

## 2019-07-02 MED ORDER — FENOFIBRATE 160 MG PO TABS
160.0000 mg | ORAL_TABLET | Freq: Every evening | ORAL | Status: DC
  Administered 2019-07-02 – 2019-07-03 (×2): 160 mg via ORAL
  Filled 2019-07-02 (×3): qty 1

## 2019-07-02 MED ORDER — PANCRELIPASE (LIP-PROT-AMYL) 12000-38000 UNITS PO CPEP
12000.0000 [IU] | ORAL_CAPSULE | Freq: Three times a day (TID) | ORAL | Status: DC
  Administered 2019-07-02 – 2019-07-03 (×5): 12000 [IU] via ORAL
  Filled 2019-07-02 (×7): qty 1

## 2019-07-02 MED ORDER — ONDANSETRON HCL 4 MG PO TABS: 4.0000 mg | ORAL_TABLET | Freq: Four times a day (QID) | ORAL | Status: DC | PRN

## 2019-07-02 MED ORDER — ACETAMINOPHEN 325 MG PO TABS: 650.0000 mg | ORAL_TABLET | Freq: Four times a day (QID) | ORAL | Status: DC | PRN

## 2019-07-02 MED ORDER — ACETAMINOPHEN 650 MG RE SUPP: 650.0000 mg | Freq: Four times a day (QID) | RECTAL | Status: DC | PRN

## 2019-07-02 MED ORDER — VITAMIN B-12 1000 MCG PO TABS
2500.0000 ug | ORAL_TABLET | Freq: Every morning | ORAL | Status: DC
  Administered 2019-07-02 – 2019-07-03 (×2): 2500 ug via ORAL
  Filled 2019-07-02: qty 2.5
  Filled 2019-07-02: qty 3

## 2019-07-02 MED ORDER — LATANOPROST 0.005 % OP SOLN
1.0000 [drp] | Freq: Every day | OPHTHALMIC | Status: DC
  Filled 2019-07-02 (×2): qty 2.5

## 2019-07-02 MED ORDER — ALLOPURINOL 300 MG PO TABS
300.0000 mg | ORAL_TABLET | Freq: Every day | ORAL | Status: DC
  Administered 2019-07-03: 300 mg via ORAL
  Filled 2019-07-02 (×2): qty 1

## 2019-07-02 MED ORDER — ONDANSETRON HCL 4 MG/2ML IJ SOLN: 4.0000 mg | Freq: Four times a day (QID) | INTRAMUSCULAR | Status: DC | PRN

## 2019-07-02 MED ORDER — GABAPENTIN 300 MG PO CAPS
300.0000 mg | ORAL_CAPSULE | Freq: Two times a day (BID) | ORAL | Status: DC
  Administered 2019-07-02 – 2019-07-03 (×3): 300 mg via ORAL
  Filled 2019-07-02 (×3): qty 1

## 2019-07-02 MED ORDER — ATORVASTATIN CALCIUM 10 MG PO TABS
20.0000 mg | ORAL_TABLET | Freq: Every evening | ORAL | Status: DC
  Administered 2019-07-02 – 2019-07-03 (×2): 20 mg via ORAL
  Filled 2019-07-02: qty 1
  Filled 2019-07-02 (×2): qty 2

## 2019-07-02 MED ORDER — MAGNESIUM OXIDE 400 MG PO TABS
400.0000 mg | ORAL_TABLET | Freq: Two times a day (BID) | ORAL | Status: DC
  Administered 2019-07-02: 400 mg via ORAL
  Filled 2019-07-02 (×3): qty 1

## 2019-07-02 MED ORDER — SERTRALINE HCL 100 MG PO TABS
100.0000 mg | ORAL_TABLET | Freq: Every day | ORAL | Status: DC
  Administered 2019-07-03: 09:00:00 100 mg via ORAL
  Filled 2019-07-02: qty 1

## 2019-07-02 MED ORDER — FERROUS SULFATE 325 (65 FE) MG PO TABS
325.0000 mg | ORAL_TABLET | Freq: Two times a day (BID) | ORAL | Status: DC
  Administered 2019-07-02 – 2019-07-03 (×3): 325 mg via ORAL
  Filled 2019-07-02 (×5): qty 1

## 2019-07-02 MED ORDER — MAGNESIUM OXIDE 400 (241.3 MG) MG PO TABS
400.0000 mg | ORAL_TABLET | Freq: Two times a day (BID) | ORAL | Status: DC
  Administered 2019-07-02 – 2019-07-03 (×2): 400 mg via ORAL
  Filled 2019-07-02 (×2): qty 1

## 2019-07-02 NOTE — ED Notes (Signed)
Patient moved into hospital bed for comfort.

## 2019-07-02 NOTE — ED Notes (Signed)
Patient given coffee per request. 

## 2019-07-02 NOTE — Progress Notes (Addendum)
Patient seen and examined this morning, admitted overnight by Dr. Raliegh Ip, H&P reviewed and agree with the assessment and plan.  86 year old male with paroxysmal A. fib, CAD status post CABG, prostate cancer, DM 2, gout, who was recently admitted 3 weeks ago after having a GI bleed with unrevealing work-up which included EGD, colonoscopy, capsule endoscopy.  She saw Dr. Almyra Free on Monday as an outpatient and reported couple episodes of ongoing rectal bleed and fecal occult was positive in office.  He was referred to the ER for evaluation.  In the ER patient also reported that inadvertently has taken more doses of his Xarelto, calcium and magnesium, he does not remember that apparently he took 2-3 days worth.   Acute GI bleeding in the setting of having inadvertently taken increased dose of Xarelto -Continue to closely monitor in the inpatient setting, follow CBC.  For now stable.  Discussed with Dr. [name] over the phone, as well as he does not have any active bleed and hemoglobin does not drop he agrees with monitoring, and he will plan another capsule endoscopy as an outpatient.  If patient develops bleeding or hemoglobin drops please contact Dr. [name] -For now we will keep a clear liquid diet in case he starts bleeding  Inadvertent overdose of calcium/magnesium -Follow BMP, monitor EKG, check magnesium levels  DM2 -Continue sliding scale  A. fib -Rate controlled, hold Xarelto  CAD status post CABG -Stable, no chest pain  Acute blood loss anemia -CBC stable, hemoglobin actually increasing, fecal occult was negative  History of non-Hodgkin's lymphoma and prostate cancer -Follow-up as an outpatient  Scheduled Meds: . allopurinol  300 mg Oral QPC breakfast  . atorvastatin  20 mg Oral QPM  . fenofibrate  160 mg Oral QPM  . ferrous sulfate  325 mg Oral BID WC  . gabapentin  300 mg Oral BID  . insulin aspart  0-6 Units Subcutaneous Q4H  . insulin glargine  10 Units Subcutaneous Daily  .  latanoprost  1 drop Both Eyes QHS  . lipase/protease/amylase  12,000 Units Oral TID WC  . magnesium oxide  400 mg Oral BID  . potassium chloride  40 mEq Oral Once  . sertraline  100 mg Oral QPC breakfast  . vitamin B-12  2,500 mcg Oral q morning - 10a   Continuous Infusions: PRN Meds:.acetaminophen **OR** acetaminophen, ondansetron **OR** ondansetron (ZOFRAN) IV  Keely Drennan M. Cruzita Lederer, MD, PhD [facility]  Between 7 am - 7 pm I am available, please contact me via Amion or Securechat Between 7 pm - 7 am I am not available, please contact night coverage MD/APP via Amion
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
## 2019-07-02 NOTE — ED Notes (Signed)
[name],daughter, [phone] wants an update on her father.

## 2019-07-02 NOTE — ED Provider Notes (Addendum)
Attestation: Medical screening examination/treatment/procedure(s) were conducted as a shared visit with non-physician practitioner(s) and myself.  I personally evaluated the patient during the encounter.   Briefly, the patient is a 86 y.o. male with h/o A. fib on Xarelto, here for accidental overdose on home medications including Xarelto.  Patient with also history of occult GI bleed.  Hemoccult positive at [facility] office earlier today.  Sent here for admission for observation.   Vitals:   07/01/19 2359 07/02/19 0004  BP: (!) 110/58 (!) 104/57  Pulse: 70 80  Resp:  17  Temp:    SpO2: 96% 97%    CONSTITUTIONAL:  well-appearing, NAD NEURO:  Alert and oriented x 3, no focal deficits EYES:  pupils equal and reactive ENT/NECK:  trachea midline, no JVD CARDIO: Regular rate and rhythm, well-perfused PULM: None labored breathing GI/GU:  Abdomin non-distended MSK/SPINE:  No gross deformities, no edema SKIN:  no rash, atraumatic PSYCH:  Appropriate speech and behavior   EKG Interpretation  Date/Time:  Tuesday July 02 2019 06:41:06 EST Ventricular Rate:  72 PR Interval:    QRS Duration: 92 QT Interval:  420 QTC Calculation: 460 R Axis:   28 Text Interpretation: Sinus rhythm Low voltage, extremity and precordial leads No acute changes Confirmed by Hoffman ([id number]) on 07/02/2019 6:54:59 AM       Stable Hb. Admitted for Hb trend and monitor for GI bleed.     Fatima Blank, MD 07/02/19 [phone]

## 2019-07-03 DIAGNOSIS — K625 Hemorrhage of anus and rectum: Secondary | ICD-10-CM

## 2019-07-03 DIAGNOSIS — I10 Essential (primary) hypertension: Secondary | ICD-10-CM

## 2019-07-03 DIAGNOSIS — I480 Paroxysmal atrial fibrillation: Secondary | ICD-10-CM

## 2019-07-03 LAB — COMPREHENSIVE METABOLIC PANEL
ALT: 40 U/L (ref 0–44)
AST: 67 U/L — ABNORMAL HIGH (ref 15–41)
Albumin: 2.7 g/dL — ABNORMAL LOW (ref 3.5–5.0)
Alkaline Phosphatase: 49 U/L (ref 38–126)
Anion gap: 3 — ABNORMAL LOW (ref 5–15)
BUN: 12 mg/dL (ref 8–23)
CO2: 25 mmol/L (ref 22–32)
Calcium: 8.4 mg/dL — ABNORMAL LOW (ref 8.9–10.3)
Chloride: 104 mmol/L (ref 98–111)
Creatinine, Ser: 0.92 mg/dL (ref 0.61–1.24)
GFR calc Af Amer: >60 mL/min (ref >60)
GFR calc non Af Amer: >60 mL/min (ref >60)
Glucose, Bld: 118 mg/dL — ABNORMAL HIGH (ref 70–99)
Potassium: 4.3 mmol/L (ref 3.5–5.1)
Sodium: 132 mmol/L — ABNORMAL LOW (ref 135–145)
Total Bilirubin: 1 mg/dL (ref 0.3–1.2)
Total Protein: 5.4 g/dL — ABNORMAL LOW (ref 6.5–8.1)

## 2019-07-03 LAB — GLUCOSE, CAPILLARY
Glucose-Capillary: 128 mg/dL — ABNORMAL HIGH (ref 70–99)
Glucose-Capillary: 138 mg/dL — ABNORMAL HIGH (ref 70–99)
Glucose-Capillary: 174 mg/dL — ABNORMAL HIGH (ref 70–99)
Glucose-Capillary: 136 mg/dL — ABNORMAL HIGH (ref 70–99)

## 2019-07-03 LAB — CBC
HCT: 29 % — ABNORMAL LOW (ref 39.0–52.0)
Hemoglobin: 9 g/dL — ABNORMAL LOW (ref 13.0–17.0)
MCH: 26.2 pg (ref 26.0–34.0)
MCHC: 31 g/dL (ref 30.0–36.0)
MCV: 84.3 fL (ref 80.0–100.0)
Platelets: 145 10*3/uL — ABNORMAL LOW (ref 150–400)
RBC: 3.44 MIL/uL — ABNORMAL LOW (ref 4.22–5.81)
RDW: 18.6 % — ABNORMAL HIGH (ref 11.5–15.5)
WBC: 7 10*3/uL (ref 4.0–10.5)
nRBC: 0 % (ref 0.0–0.2)

## 2019-07-03 LAB — PHOSPHORUS: Phosphorus: <1 mg/dL — CL (ref 2.5–4.6)

## 2019-07-03 LAB — MAGNESIUM: Magnesium: 1.8 mg/dL (ref 1.7–2.4)

## 2019-07-03 LAB — FERRITIN: Ferritin: 1709 ng/mL — ABNORMAL HIGH (ref 24–336)

## 2019-07-03 MED ORDER — RIVAROXABAN 15 MG PO TABS
15.0000 mg | ORAL_TABLET | Freq: Every day | ORAL | Status: DC
  Administered 2019-07-03: 17:00:00 15 mg via ORAL
  Filled 2019-07-03: qty 1

## 2019-07-03 MED ORDER — SODIUM PHOSPHATES 45 MMOLE/15ML IV SOLN
30.0000 mmol | Freq: Once | INTRAVENOUS | Status: AC
  Administered 2019-07-03: 11:00:00 30 mmol via INTRAVENOUS
  Filled 2019-07-03: qty 10

## 2019-07-03 NOTE — Discharge Instructions (Signed)

## 2019-07-03 NOTE — Discharge Summary (Signed)
Physician Discharge Summary  [name] EXB:[id number] DOB: Apr 29, 1931  PCP: [name], MD  Admitted from: Home Discharged to: Home  Admit date: 07/01/2019 Discharge date: 07/03/2019  Recommendations for Outpatient Follow-up:   Follow-up Information    [name], MD. Schedule an appointment as soon as possible for a visit in 3 day(s).   Specialty: Internal Medicine Why: To be seen with repeat labs (CBC, BMP, magnesium and phosphorus) Contact information: [place]  [id number] [phone]        Sanda Klein, MD .   Specialty: Cardiology Contact information: [address] [place] [address] [phone]        Carol Ada, MD Follow up.   Specialty: Gastroenterology Why: MDs office will arrange follow-up including for capsule study. Contact information: [place], [place] [address] [phone]            Home Health: PT Equipment/Devices: None  Discharge Condition: Improved and stable CODE STATUS: DNR Diet recommendation: Heart healthy & diabetic diet.  Discharge Diagnoses:  Principal Problem:   Acute GI bleeding Active Problems:   Non Hodgkin's lymphoma (HCC)   SSS (sick sinus syndrome) (HCC)   Paroxysmal atrial fibrillation (HCC)   Prostate cancer (HCC)   Diabetes mellitus with renal manifestations, controlled (HCC)   CKD (chronic kidney disease) stage 3, GFR 30-59 ml/min (HCC)   Essential hypertension   Type II diabetes mellitus (HCC)   Coronary artery disease involving native coronary artery of native heart without angina pectoris   Acute blood loss anemia   Brief Summary: 86 year old male, lives with his daughter and son-in-law, of late has been ambulating with the help of walker, PMH of paroxysmal atrial fibrillation on Xarelto, CAD status post CABG, prostate cancer on Lupron, type II DM/IDDM, gout, iron deficiency anemia with ongoing IV iron infusions  every other week at PCPs office, hypertension, GERD, hyperlipidemia, hard of hearing, recently hospitalized 3 weeks ago after having GI bleed with unrevealing work-up including EGD, colonoscopy and capsule endoscopy.  Patient saw Dr. [name], GI on 07/01/2019 where he reported a couple of episodes of ongoing rectal bleeding and FOBT was positive.  He was referred to the ED for evaluation.  In the ED patient reported that he inadvertently took more doses than he showed of his Xarelto, calcium and magnesium.  Admitted for further evaluation and management.  Assessment and plan:  1. GI bleeding, recurrent: In the setting of inadvertently taking higher than usual dose of Xarelto due to some confusion.  No BM or bleeding since admission, confirmed with RN.  Hemoglobin has been stable.  Last dose of Xarelto per MAR was on 3/7.  I discussed with Dr. [name] on day of discharge who advised that since patient does not have any recurrent overt GI bleeding, hemoglobin is stable, advanced diet, no indication for PPI, okay for discharge home and will arrange outpatient follow-up for another capsule endoscopy. 2. Cognitive impairment and gait abnormality: As per discussion with daughter this morning, since his acute illness, patient has developed some worsening memory deficits, confusion and gait abnormality.  No focal deficits on exam.  Therapies evaluation requested and recommend home health PT which was arranged.  Seemed appropriate during my exam. 3. Iron deficiency anemia: Possibly due to recurrent GI bleeding.  Reportedly gets IV iron every other week through his PCPs office and schedule to get a dose tomorrow.  Check ferritin which is >1700 and hence no IV iron given.  Hemoglobin stable.  Close outpatient follow-up with CBCs. 4. Hypophosphatemia: Replace IV per pharmacy and close outpatient follow-up with PCP with repeat labs in a few days. 5. Thrombocytopenia: Unclear etiology but stable. 6. Paroxysmal atrial  fibrillation: Telemetry shows sinus rhythm.  Since no overt GI bleeding and risks of debilitating stroke outweighs benefit of GI bleeding, as discussed with Dr. [name] resumed Xarelto at discharge. 7. Type II DM/IDDM: Mildly uncontrolled in the hospital.  Continue prior home regimen at discharge. 8. CAD s/p CABG: No anginal symptoms. 9. Inadvertent overdose of calcium and magnesium: No acute issues.  Discussed with daughter at length and advised that a family member should supervise his medications. 10. History of non-Hodgkin's lymphoma and prostate cancer: Outpatient follow-up. 11. History of gout: No acute flare.  Continue allopurinol.    Consultations:  Discussed multiple times with Dr. [name].  Procedures:  None.   Discharge Instructions  Discharge Instructions    Call MD for:   Complete by: As directed    Recurrent rectal bleeding.   Call MD for:  difficulty breathing, headache or visual disturbances   Complete by: As directed    Call MD for:  extreme fatigue   Complete by: As directed    Call MD for:  persistant dizziness or light-headedness   Complete by: As directed    Call MD for:  persistant nausea and vomiting   Complete by: As directed    Call MD for:  severe uncontrolled pain   Complete by: As directed    Call MD for:  temperature >100.4   Complete by: As directed    Diet - low sodium heart healthy   Complete by: As directed    Diet Carb Modified   Complete by: As directed    Increase activity slowly   Complete by: As directed        Medication List    STOP taking these medications   dextromethorphan-guaiFENesin 30-600 MG 12hr tablet Commonly known as: MUCINEX DM   linagliptin 5 MG Tabs tablet Commonly known as: TRADJENTA   mupirocin ointment 2 % Commonly known as: BACTROBAN   pantoprazole 40 MG tablet Commonly known as: PROTONIX     TAKE these medications   allopurinol 300 MG tablet Commonly known as: ZYLOPRIM Take 300 mg by mouth daily after  breakfast.   atorvastatin 20 MG tablet Commonly known as: LIPITOR Take 20 mg by mouth every evening.   CALCIUM 600/VITAMIN D3 PO Take 1,200 mg by mouth daily.   cetirizine 10 MG tablet Commonly known as: ZYRTEC Take 10 mg by mouth daily.   cholestyramine 4 g packet Commonly known as: QUESTRAN Take 4 g by mouth daily as needed (diarrhea).   Droplet Pen Needles 32G X 4 MM Misc Generic drug: Insulin Pen Needle   fenofibrate 160 MG tablet Take 160 mg by mouth every evening.   ferrous sulfate 325 (65 FE) MG tablet Take 1 tablet (325 mg total) by mouth 2 (two) times daily with a meal.   gabapentin 300 MG capsule Commonly known as: NEURONTIN Take 300 mg by mouth 2 (two) times daily.   latanoprost 0.005 % ophthalmic solution Commonly known as: XALATAN Place 1 drop into both eyes at bedtime.   leuprolide 22.5 MG injection Commonly known as: LUPRON Inject 22.5 mg into the muscle every 3 (three) months.   lipase/protease/amylase 12000 units Cpep capsule Commonly known as: CREON Take 12,000 Units by mouth 3 (three) times daily with meals.   magnesium oxide 400 MG tablet Commonly known  as: MAG-OX Take 400 mg by mouth 2 (two) times daily.   metFORMIN 500 MG 24 hr tablet Commonly known as: GLUCOPHAGE-XR Take 500 mg by mouth 2 (two) times a day.   One-A-Day Mens 50+ Advantage Tabs Take 1 tablet by mouth daily with breakfast.   polyethylene glycol powder 17 GM/SCOOP powder Commonly known as: MiraLax Take 17 g by mouth 2 (two) times daily as needed for moderate constipation.   Rivaroxaban 15 MG Tabs tablet Commonly known as: XARELTO Take 15 mg by mouth daily with lunch.   senna-docusate 8.6-50 MG tablet Commonly known as: Senokot-S Take 1 tablet by mouth 2 (two) times daily between meals as needed for mild constipation.   sertraline 100 MG tablet Commonly known as: ZOLOFT Take 100 mg by mouth daily after breakfast.   Toujeo SoloStar 300 UNIT/ML Solostar Pen Generic  drug: insulin glargine (1 Unit Dial) Inject 30 Units into the skin daily.   Vitamin B-12 2500 MCG Subl Place 2,500 mcg under the tongue every morning.   vitamin C 500 MG tablet Commonly known as: ASCORBIC ACID Take 500 mg by mouth daily after breakfast.      Allergies  Allergen Reactions  . Atenolol Other (See Comments)    "Heart rate slowed- STOPPED on 08/16/2013"  . Chlorhexidine     Port Not accessed, contraindicated  . Niacin Other (See Comments)    headaches      Procedures/Studies: No results found.    Subjective: Patient denies complaints.  No BM or rectal bleeding since hospital admission.  Indicates that he stays with his daughter and son-in-law and has been using a walker lately.  Hard of hearing.  Daughter expressed that patient had recent worsening of memory, confusion and unsteady gait and has been thereby using a walker.  No chest pain, dyspnea, dizziness or lightheadedness reported.  As per RN, no acute issues noted.  Discharge Exam:  Vitals:   07/02/19 1600 07/02/19 1647 07/02/19 2122 07/03/19 0408  BP: 129/66 135/66 (!) 101/54 101/64  Pulse:  75 73 75  Resp: 18 17 16 16   Temp:  98.7 F (37.1 C) 99.8 F (37.7 C) 98.7 F (37.1 C)  TempSrc:  Oral    SpO2:  97% 95% 94%  Weight:      Height:        General: Pt lying comfortably in bed & appears in no obvious distress.  Pleasant elderly male, moderately built and nourished. Cardiovascular: S1 & S2 heard, RRR, S1/S2 +. No murmurs, rubs, gallops or clicks. No JVD or pedal edema.  Telemetry personally reviewed: Sinus rhythm. Respiratory: Clear to auscultation without wheezing, rhonchi or crackles. No increased work of breathing. Abdominal:  Non distended, non tender & soft. No organomegaly or masses appreciated. Normal bowel sounds heard. CNS: Alert and oriented x2. No focal deficits.  Hard of hearing. Extremities: no edema, no cyanosis    The results of significant diagnostics from this  hospitalization (including imaging, microbiology, ancillary and laboratory) are listed below for reference.     Microbiology: Recent Results (from the past 240 hour(s))  SARS CORONAVIRUS 2 (TAT 6-24 HRS) Nasopharyngeal Nasopharyngeal Swab     Status: None   Collection Time: 07/02/19  6:42 AM   Specimen: Nasopharyngeal Swab  Result Value Ref Range Status   SARS Coronavirus 2 NEGATIVE NEGATIVE Final    Comment: (NOTE) SARS-CoV-2 target nucleic acids are NOT DETECTED. The SARS-CoV-2 RNA is generally detectable in upper and lower respiratory specimens during the acute phase of  infection. Negative results do not preclude SARS-CoV-2 infection, do not rule out co-infections with other pathogens, and should not be used as the sole basis for treatment or other patient management decisions. Negative results must be combined with clinical observations, patient history, and epidemiological information. The expected result is Negative. Fact Sheet for Patients: SugarRoll.be Fact Sheet for Healthcare Providers: https://www.woods-mathews.com/ This test is not yet approved or cleared by the [place] FDA and  has been authorized for detection and/or diagnosis of SARS-CoV-2 by FDA under an Emergency Use Authorization (EUA). This EUA will remain  in effect (meaning this test can be used) for the duration of the COVID-19 declaration under Section 56 4(b)(1) of the Act, 21 U.S.C. section 360bbb-3(b)(1), unless the authorization is terminated or revoked sooner. Performed at [facility], [place] [address], [place], [place] [address]      Labs: CBC: Recent Labs  Lab 07/01/19 2141 07/02/19 0453 07/02/19 0912 07/02/19 1247 07/03/19 0621  WBC 8.1 6.2 7.2 6.8 7.0  NEUTROABS 6.7  --   --   --   --   HGB 9.7* 8.6* 9.0* 8.7* 9.0*  HCT 31.1* 27.3* 29.2* 28.4* 29.0*  MCV 85.2 84.5 85.4 85.5 84.3  PLT 182 128* 139* 144* 145*    Basic Metabolic  Panel: Recent Labs  Lab 07/01/19 2141 07/02/19 0453 07/03/19 0621  NA 136 138 132*  K 3.2* 3.3* 4.3  CL 104 106 104  CO2 24 24 25   GLUCOSE 249* 154* 118*  BUN 19 17 12   CREATININE 1.10 0.96 0.92  CALCIUM 9.4 9.0 8.4*  MG  --  1.8 1.8  PHOS  --   --  <1.0*    Liver Function Tests: Recent Labs  Lab 07/01/19 2141 07/03/19 0621  AST 59* 67*  ALT 41 40  ALKPHOS 62 49  BILITOT 0.5 1.0  PROT 6.2* 5.4*  ALBUMIN 3.1* 2.7*    CBG: Recent Labs  Lab 07/02/19 2124 07/02/19 2354 07/03/19 0409 07/03/19 0821 07/03/19 1213  GLUCAP 141* 289* 128* 138* 174*     Anemia work up Recent Labs    07/03/19 0621  FERRITIN 1,709*    I discussed in detail with patient's daughter, updated care and answered questions.  She indicated that patient's other daughter manages patient's medications and they had a pillbox but it held a week supply of medications.  Given his current episode, they have now brought a single day pillbox to avoid inadvertent overdose.  Patient's daughter also monitors insulin administration.  Time coordinating discharge: 40 minutes  SIGNED:  Vernell Leep, MD, [place], [facility]. [facility]  To contact the attending provider between 7A-7P or the covering provider during after hours 7P-7A, please log into the web site [email or web address] and access using universal [place] password for that web site. If you do not have the password, please call the hospital operator.

## 2019-07-03 NOTE — Evaluation (Signed)
Physical Therapy Evaluation Patient Details Name: [name] MRN: [id number] DOB: 1931/12/26 Today's Date: 07/03/2019   History of Present Illness  86 yo male admitted with acute GI bleed. Hx of A fib, CAD, CABG, CKD, prostate ca,neuropathy, DM, gout, falls, hearing loss, lymphoma, memory deficits, anemia  Clinical Impression  On eval, pt was Supv-Min guard assist for mobility. He walked ~140 feet with use of a rollator. Per chart, pt has a history of falls. He was mildly unsteady even with use of RW on today. He is eager to get home and he hopes to be able to work in his garden soon. Recommend HHPT f/u.     Follow Up Recommendations Home health PT    Equipment Recommendations  None recommended by PT    Recommendations for Other Services       Precautions / Restrictions Precautions Precautions: Fall Restrictions Weight Bearing Restrictions: No      Mobility  Bed Mobility Overal bed mobility: Needs Assistance Bed Mobility: Sit to Supine       Sit to supine: Supervision   General bed mobility comments: for lines  Transfers   Equipment used: Rolling walker (2 wheeled) Transfers: Sit to/from Stand Sit to Stand: Supervision         General transfer comment: for safety. Cues for safety, hand placement, proper operation of rollator  Ambulation/Gait Ambulation/Gait assistance: Min guard Gait Distance (Feet): 140 Feet Assistive device: 4-wheeled walker Gait Pattern/deviations: Step-through pattern;Decreased stride length;Trunk flexed     General Gait Details: Close guard for safety. Mildly unsteady. Pt tolerated distance well.  Stairs            Wheelchair Mobility    Modified Rankin (Stroke Patients Only)       Balance Overall balance assessment: Mild deficits observed, not formally tested;History of Falls                                           Pertinent Vitals/Pain Pain Assessment: No/denies pain    Home Living  Family/patient expects to be discharged to:: Private residence Living Arrangements: Children;Other relatives Available Help at Discharge: Family;Available 24 hours/day Type of Home: House Home Access: Ramped entrance     Home Layout: One level Home Equipment: Walker - 4 wheels;Walker - 2 wheels;Shower seat;Cane - single point;Bedside commode      Prior Function Level of Independence: Independent         Comments: Pt has used a RW/rollator occasionally in his home but not regularly, he thinks it might be time to start using one though. He enjoys gardening.     Hand Dominance        Extremity/Trunk Assessment   Upper Extremity Assessment Upper Extremity Assessment: Generalized weakness    Lower Extremity Assessment Lower Extremity Assessment: Generalized weakness    Cervical / Trunk Assessment Cervical / Trunk Assessment: Kyphotic  Communication   Communication: HOH  Cognition Arousal/Alertness: Awake/alert Behavior During Therapy: WFL for tasks assessed/performed Overall Cognitive Status: Within Functional Limits for tasks assessed                                        General Comments      Exercises     Assessment/Plan    PT Assessment Patient needs continued PT services  PT Problem  List Decreased strength;Decreased mobility;Decreased balance;Decreased activity tolerance       PT Treatment Interventions DME instruction;Therapeutic exercise;Balance training;Gait training;Functional mobility training;Therapeutic activities;Patient/family education    PT Goals (Current goals can be found in the Care Plan section)  Acute Rehab PT Goals Patient Stated Goal: to stronger and get back into garden PT Goal Formulation: With patient Time For Goal Achievement: 07/17/19 Potential to Achieve Goals: Good    Frequency Min 3X/week   Barriers to discharge        Co-evaluation               AM-PAC PT "6 Clicks" Mobility  Outcome Measure  Help needed turning from your back to your side while in a flat bed without using bedrails?: A Little Help needed moving from lying on your back to sitting on the side of a flat bed without using bedrails?: A Little Help needed moving to and from a bed to a chair (including a wheelchair)?: A Little Help needed standing up from a chair using your arms (e.g., wheelchair or bedside chair)?: A Little Help needed to walk in hospital room?: A Little Help needed climbing 3-5 steps with a railing? : A Little 6 Click Score: 18    End of Session Equipment Utilized During Treatment: Gait belt Activity Tolerance: Patient tolerated treatment well Patient left: in bed;with call bell/phone within reach;with bed alarm set   PT Visit Diagnosis: Unsteadiness on feet (R26.81);History of falling (Z91.81)    Time: 1137-1150 PT Time Calculation (min) (ACUTE ONLY): 13 min   Charges:   PT Evaluation $PT Eval Low Complexity: 1 Low           Nikeshia Keetch P, PT Acute Rehabilitation
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
## 2019-07-04 DIAGNOSIS — I129 Hypertensive chronic kidney disease with stage 1 through stage 4 chronic kidney disease, or unspecified chronic kidney disease: Secondary | ICD-10-CM | POA: Diagnosis not present

## 2019-07-04 DIAGNOSIS — Z951 Presence of aortocoronary bypass graft: Secondary | ICD-10-CM | POA: Diagnosis not present

## 2019-07-04 DIAGNOSIS — I2510 Atherosclerotic heart disease of native coronary artery without angina pectoris: Secondary | ICD-10-CM | POA: Diagnosis not present

## 2019-07-04 DIAGNOSIS — Z8572 Personal history of non-Hodgkin lymphomas: Secondary | ICD-10-CM | POA: Diagnosis not present

## 2019-07-04 DIAGNOSIS — Z7901 Long term (current) use of anticoagulants: Secondary | ICD-10-CM | POA: Diagnosis not present

## 2019-07-04 DIAGNOSIS — D509 Iron deficiency anemia, unspecified: Secondary | ICD-10-CM | POA: Diagnosis not present

## 2019-07-04 DIAGNOSIS — Z794 Long term (current) use of insulin: Secondary | ICD-10-CM | POA: Diagnosis not present

## 2019-07-04 DIAGNOSIS — N183 Chronic kidney disease, stage 3 unspecified: Secondary | ICD-10-CM | POA: Diagnosis not present

## 2019-07-04 DIAGNOSIS — G3184 Mild cognitive impairment, so stated: Secondary | ICD-10-CM | POA: Diagnosis not present

## 2019-07-04 DIAGNOSIS — D62 Acute posthemorrhagic anemia: Secondary | ICD-10-CM | POA: Diagnosis not present

## 2019-07-04 DIAGNOSIS — I495 Sick sinus syndrome: Secondary | ICD-10-CM | POA: Diagnosis not present

## 2019-07-04 DIAGNOSIS — R2689 Other abnormalities of gait and mobility: Secondary | ICD-10-CM | POA: Diagnosis not present

## 2019-07-04 DIAGNOSIS — Z8546 Personal history of malignant neoplasm of prostate: Secondary | ICD-10-CM | POA: Diagnosis not present

## 2019-07-04 DIAGNOSIS — E1122 Type 2 diabetes mellitus with diabetic chronic kidney disease: Secondary | ICD-10-CM | POA: Diagnosis not present

## 2019-07-04 DIAGNOSIS — K921 Melena: Secondary | ICD-10-CM | POA: Diagnosis not present

## 2019-07-04 DIAGNOSIS — T45511D Poisoning by anticoagulants, accidental (unintentional), subsequent encounter: Secondary | ICD-10-CM | POA: Diagnosis not present

## 2019-07-04 DIAGNOSIS — E1140 Type 2 diabetes mellitus with diabetic neuropathy, unspecified: Secondary | ICD-10-CM | POA: Diagnosis not present

## 2019-07-04 DIAGNOSIS — M1000 Idiopathic gout, unspecified site: Secondary | ICD-10-CM | POA: Diagnosis not present

## 2019-07-04 DIAGNOSIS — I480 Paroxysmal atrial fibrillation: Secondary | ICD-10-CM | POA: Diagnosis not present

## 2019-07-08 ENCOUNTER — Ambulatory Visit (HOSPITAL_COMMUNITY)
Admission: RE | Admit: 2019-07-08 | Discharge: 2019-07-08 | Disposition: A | Discharge status: Home / Self Care | Payer: Medicare Other | Source: Ambulatory Visit | Attending: Gastroenterology | Admitting: Gastroenterology

## 2019-07-08 ENCOUNTER — Encounter (HOSPITAL_COMMUNITY)
Admission: RE | Disposition: A | Discharge status: Home / Self Care | Payer: Self-pay | Source: Ambulatory Visit | Attending: Gastroenterology

## 2019-07-08 DIAGNOSIS — D649 Anemia, unspecified: Secondary | ICD-10-CM | POA: Diagnosis not present

## 2019-07-08 DIAGNOSIS — D509 Iron deficiency anemia, unspecified: Secondary | ICD-10-CM | POA: Diagnosis not present

## 2019-07-08 DIAGNOSIS — I10 Essential (primary) hypertension: Secondary | ICD-10-CM | POA: Diagnosis not present

## 2019-07-08 DIAGNOSIS — K5710 Diverticulosis of small intestine without perforation or abscess without bleeding: Secondary | ICD-10-CM | POA: Diagnosis not present

## 2019-07-08 DIAGNOSIS — E782 Mixed hyperlipidemia: Secondary | ICD-10-CM | POA: Diagnosis not present

## 2019-07-08 DIAGNOSIS — D500 Iron deficiency anemia secondary to blood loss (chronic): Secondary | ICD-10-CM | POA: Diagnosis not present

## 2019-07-08 DIAGNOSIS — Z23 Encounter for immunization: Secondary | ICD-10-CM | POA: Diagnosis not present

## 2019-07-08 DIAGNOSIS — F329 Major depressive disorder, single episode, unspecified: Secondary | ICD-10-CM | POA: Diagnosis not present

## 2019-07-08 DIAGNOSIS — E119 Type 2 diabetes mellitus without complications: Secondary | ICD-10-CM | POA: Diagnosis not present

## 2019-07-08 HISTORY — PX: GIVENS CAPSULE STUDY: SHX5432

## 2019-07-08 SURGERY — IMAGING PROCEDURE, GI TRACT, INTRALUMINAL, VIA CAPSULE

## 2019-07-08 SURGICAL SUPPLY — 1 items: TOWEL COTTON PACK 4EA (MISCELLANEOUS) ×6 IMPLANT

## 2019-07-09 ENCOUNTER — Encounter: Payer: Self-pay | Admitting: Podiatry

## 2019-07-09 ENCOUNTER — Other Ambulatory Visit: Payer: Self-pay | Admitting: Oncology

## 2019-07-09 ENCOUNTER — Ambulatory Visit (INDEPENDENT_AMBULATORY_CARE_PROVIDER_SITE_OTHER): Payer: Medicare Other | Admitting: Podiatry

## 2019-07-09 ENCOUNTER — Other Ambulatory Visit: Payer: Self-pay

## 2019-07-09 DIAGNOSIS — M79675 Pain in left toe(s): Secondary | ICD-10-CM

## 2019-07-09 DIAGNOSIS — M79674 Pain in right toe(s): Secondary | ICD-10-CM | POA: Diagnosis not present

## 2019-07-09 DIAGNOSIS — E1149 Type 2 diabetes mellitus with other diabetic neurological complication: Secondary | ICD-10-CM

## 2019-07-09 DIAGNOSIS — B351 Tinea unguium: Secondary | ICD-10-CM

## 2019-07-09 DIAGNOSIS — L97512 Non-pressure chronic ulcer of other part of right foot with fat layer exposed: Secondary | ICD-10-CM | POA: Diagnosis not present

## 2019-07-09 DIAGNOSIS — D500 Iron deficiency anemia secondary to blood loss (chronic): Secondary | ICD-10-CM | POA: Diagnosis not present

## 2019-07-09 NOTE — Progress Notes (Signed)
Subjective: 86 year old male presents the office today for follow-up evaluation of a wound on the right foot.  He thinks that overall he is doing better.  He gets some occasional bloody drainage but he thinks it is less than what has been previously denies any pus.  Denies any increase in swelling or redness.  Still on antibiotics.  No significant pain today.  Also asking for his nails be trimmed today they are thick and elongated. Denies any systemic complaints such as fevers, chills, nausea, vomiting. No acute changes since last appointment, and no other complaints at this time.   Objective: AAO x3, NAD-presents with daughter DP/PT pulses palpable bilaterally, CRT less than 3 seconds Bony prominence present on the plantar medial aspect of the foot from chronic foot deformity.  This is resulting in a hyperkeratotic lesion with central ulceration.  After debridement of the wound today it measures 0.5 x 0.4 x 0.1 cm with a granular wound base.  There is no probing to bone, undermining or tunneling.  1 more the medial aspect of the wound with some chronic erythema but I think this is more from where it rubs inside shoes.  There is no warmth.  There is no ascending cellulitis.  There is no fluctuation crepitation any malodor. Nails are hypertrophic, dystrophic, brittle, discolored, elongated 10. No surrounding redness or drainage. Tenderness nails 1-5 bilaterally.  No further open lesions or pre-ulcerative lesions.  No pain with calf compression, swelling, warmth, erythema  Assessment: Ulceration right foot  Plan: -All treatment options discussed with the patient including all alternatives, risks, complications.  -Sharply debrided the wound today utilizing the 312 with scalpel any complications, healthy tissue.  I applied Medihoney today.  Finish course of antibiotics. Offloading.  I also had Liliane Channel evaluate him today for possible further offloading.  He thinks that he will benefit more from a Mezzo  brace. He was measured for this to help offload. -Debrided the nails x10 without any complications or bleeding -Monitor for any clinical signs or symptoms of infection and directed to call the office immediately should any occur or go to the ER.  Return in about 2 weeks (around 07/23/2019) for wound check .  Trula Slade DPM

## 2019-07-10 ENCOUNTER — Ambulatory Visit (INDEPENDENT_AMBULATORY_CARE_PROVIDER_SITE_OTHER): Payer: Medicare Other | Admitting: Physician Assistant

## 2019-07-10 ENCOUNTER — Encounter: Payer: Self-pay | Admitting: Physician Assistant

## 2019-07-10 VITALS — BP 130/72 | HR 88 | Temp 97.7°F | Ht 68.0 in | Wt 146.0 lb

## 2019-07-10 DIAGNOSIS — I480 Paroxysmal atrial fibrillation: Secondary | ICD-10-CM | POA: Diagnosis not present

## 2019-07-10 DIAGNOSIS — E119 Type 2 diabetes mellitus without complications: Secondary | ICD-10-CM

## 2019-07-10 DIAGNOSIS — Z794 Long term (current) use of insulin: Secondary | ICD-10-CM | POA: Diagnosis not present

## 2019-07-10 DIAGNOSIS — G4733 Obstructive sleep apnea (adult) (pediatric): Secondary | ICD-10-CM

## 2019-07-10 DIAGNOSIS — I10 Essential (primary) hypertension: Secondary | ICD-10-CM

## 2019-07-10 DIAGNOSIS — I2510 Atherosclerotic heart disease of native coronary artery without angina pectoris: Secondary | ICD-10-CM | POA: Diagnosis not present

## 2019-07-10 DIAGNOSIS — R197 Diarrhea, unspecified: Secondary | ICD-10-CM | POA: Diagnosis not present

## 2019-07-10 DIAGNOSIS — E785 Hyperlipidemia, unspecified: Secondary | ICD-10-CM

## 2019-07-10 DIAGNOSIS — D649 Anemia, unspecified: Secondary | ICD-10-CM

## 2019-07-10 DIAGNOSIS — D500 Iron deficiency anemia secondary to blood loss (chronic): Secondary | ICD-10-CM | POA: Diagnosis not present

## 2019-07-10 DIAGNOSIS — I4891 Unspecified atrial fibrillation: Secondary | ICD-10-CM | POA: Diagnosis not present

## 2019-07-10 DIAGNOSIS — E782 Mixed hyperlipidemia: Secondary | ICD-10-CM | POA: Diagnosis not present
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
## 2019-07-10 NOTE — Patient Instructions (Signed)
Medication Instructions:  Your physician recommends that you continue on your current medications as directed. Please refer to the Current Medication list given to you today.  *If you need a refill on your cardiac medications before your next appointment, please call your pharmacy*  Lab Work: NONE ordered at this time of appointment   If you have labs (blood work) drawn today and your tests are completely normal, you will receive your results only by: Marland Kitchen MyChart Message (if you have MyChart) OR . A paper copy in the mail If you have any lab test that is abnormal or we need to change your treatment, we will call you to review the results.  Testing/Procedures: NONE ordered at this time of appointment   Follow-Up: At [facility], you and your health needs are our priority.  As part of our continuing mission to provide you with exceptional heart care, we have created designated Provider Care Teams.  These Care Teams include your primary Cardiologist (physician) and Advanced Practice Providers (APPs -  Physician Assistants and Nurse Practitioners) who all work together to provide you with the care you need, when you need it.  We recommend signing up for the patient portal called "MyChart".  Sign up information is provided on this After Visit Summary.  MyChart is used to connect with patients for Virtual Visits (Telemedicine).  Patients are able to view lab/test results, encounter notes, upcoming appointments, etc.  Non-urgent messages can be sent to your provider as well.   To learn more about what you can do with MyChart, go to [email or web address].    Your next appointment:   3-4 month(s)  The format for your next appointment:   In Person  Provider:   Sanda Klein, MD  Other Instructions

## 2019-07-11 DIAGNOSIS — G3184 Mild cognitive impairment, so stated: Secondary | ICD-10-CM | POA: Diagnosis not present

## 2019-07-11 DIAGNOSIS — D62 Acute posthemorrhagic anemia: Secondary | ICD-10-CM | POA: Diagnosis not present

## 2019-07-11 DIAGNOSIS — T45511D Poisoning by anticoagulants, accidental (unintentional), subsequent encounter: Secondary | ICD-10-CM | POA: Diagnosis not present

## 2019-07-11 DIAGNOSIS — E1140 Type 2 diabetes mellitus with diabetic neuropathy, unspecified: Secondary | ICD-10-CM | POA: Diagnosis not present

## 2019-07-11 DIAGNOSIS — R2689 Other abnormalities of gait and mobility: Secondary | ICD-10-CM | POA: Diagnosis not present

## 2019-07-11 DIAGNOSIS — K921 Melena: Secondary | ICD-10-CM | POA: Diagnosis not present

## 2019-07-12 ENCOUNTER — Encounter: Payer: Self-pay | Admitting: Physician Assistant

## 2019-07-12 DIAGNOSIS — D509 Iron deficiency anemia, unspecified: Secondary | ICD-10-CM | POA: Diagnosis not present

## 2019-07-12 DIAGNOSIS — K5730 Diverticulosis of large intestine without perforation or abscess without bleeding: Secondary | ICD-10-CM | POA: Diagnosis not present

## 2019-07-16 DIAGNOSIS — R2689 Other abnormalities of gait and mobility: Secondary | ICD-10-CM | POA: Diagnosis not present

## 2019-07-16 DIAGNOSIS — K921 Melena: Secondary | ICD-10-CM | POA: Diagnosis not present

## 2019-07-16 DIAGNOSIS — G3184 Mild cognitive impairment, so stated: Secondary | ICD-10-CM | POA: Diagnosis not present

## 2019-07-16 DIAGNOSIS — E1140 Type 2 diabetes mellitus with diabetic neuropathy, unspecified: Secondary | ICD-10-CM | POA: Diagnosis not present

## 2019-07-16 DIAGNOSIS — D62 Acute posthemorrhagic anemia: Secondary | ICD-10-CM | POA: Diagnosis not present

## 2019-07-16 DIAGNOSIS — T45511D Poisoning by anticoagulants, accidental (unintentional), subsequent encounter: Secondary | ICD-10-CM | POA: Diagnosis not present

## 2019-07-18 ENCOUNTER — Telehealth: Payer: Self-pay | Admitting: Physician Assistant

## 2019-07-18 ENCOUNTER — Other Ambulatory Visit: Payer: Self-pay | Admitting: Physician Assistant

## 2019-07-18 DIAGNOSIS — R2689 Other abnormalities of gait and mobility: Secondary | ICD-10-CM | POA: Diagnosis not present

## 2019-07-18 DIAGNOSIS — T45511D Poisoning by anticoagulants, accidental (unintentional), subsequent encounter: Secondary | ICD-10-CM | POA: Diagnosis not present

## 2019-07-18 DIAGNOSIS — D62 Acute posthemorrhagic anemia: Secondary | ICD-10-CM | POA: Diagnosis not present

## 2019-07-18 DIAGNOSIS — E1140 Type 2 diabetes mellitus with diabetic neuropathy, unspecified: Secondary | ICD-10-CM | POA: Diagnosis not present

## 2019-07-18 DIAGNOSIS — K921 Melena: Secondary | ICD-10-CM | POA: Diagnosis not present

## 2019-07-18 DIAGNOSIS — G3184 Mild cognitive impairment, so stated: Secondary | ICD-10-CM | POA: Diagnosis not present

## 2019-07-18 NOTE — Telephone Encounter (Signed)
Finally received the recent capsule endoscopy report from Dr. Ulyses Amor office. Report showed 2 diverticula in the prox small bowel, and black lesion vs object in the distal bowel. I discussed with his daughter to recommend stopping the Xarelto permanently per our previous discussion last week. She says he had to stop the Xarelto last Friday and has been holding since. She is aware that Xarelto will be permanently stopped. During the previous office, I have discussed quite extensively about this, and she is aware the higher risk of stroke off of Xarelto. However, the bleeding risk is simply too high at this time.

## 2019-07-23 ENCOUNTER — Ambulatory Visit (INDEPENDENT_AMBULATORY_CARE_PROVIDER_SITE_OTHER): Payer: Medicare Other | Admitting: Podiatry

## 2019-07-23 ENCOUNTER — Other Ambulatory Visit: Payer: Self-pay

## 2019-07-23 VITALS — Temp 97.0°F

## 2019-07-23 DIAGNOSIS — L97511 Non-pressure chronic ulcer of other part of right foot limited to breakdown of skin: Secondary | ICD-10-CM

## 2019-07-23 DIAGNOSIS — E1149 Type 2 diabetes mellitus with other diabetic neurological complication: Secondary | ICD-10-CM

## 2019-07-23 DIAGNOSIS — T45511D Poisoning by anticoagulants, accidental (unintentional), subsequent encounter: Secondary | ICD-10-CM | POA: Diagnosis not present

## 2019-07-23 DIAGNOSIS — E1140 Type 2 diabetes mellitus with diabetic neuropathy, unspecified: Secondary | ICD-10-CM | POA: Diagnosis not present

## 2019-07-23 DIAGNOSIS — K921 Melena: Secondary | ICD-10-CM | POA: Diagnosis not present

## 2019-07-23 DIAGNOSIS — R2689 Other abnormalities of gait and mobility: Secondary | ICD-10-CM | POA: Diagnosis not present

## 2019-07-23 DIAGNOSIS — D62 Acute posthemorrhagic anemia: Secondary | ICD-10-CM | POA: Diagnosis not present

## 2019-07-23 DIAGNOSIS — G3184 Mild cognitive impairment, so stated: Secondary | ICD-10-CM | POA: Diagnosis not present

## 2019-07-24 DIAGNOSIS — D500 Iron deficiency anemia secondary to blood loss (chronic): Secondary | ICD-10-CM | POA: Diagnosis not present

## 2019-07-24 DIAGNOSIS — D509 Iron deficiency anemia, unspecified: Secondary | ICD-10-CM | POA: Diagnosis not present

## 2019-07-24 NOTE — Progress Notes (Signed)
Subjective: 86 year old male presents the office today for follow-up evaluation of a wound on the right foot.  He presents today with a family member.  He states the area is doing well and has not had any drainage since I last saw him.  No increase in swelling or redness.  No significant pain.  He was casted for a Mezzo brace last appointment and we are still awaiting this.  Denies any systemic complaints such as fevers, chills, nausea, vomiting. No acute changes since last appointment, and no other complaints at this time.   Objective: AAO x3, NAD-presents with daughter DP/PT pulses palpable bilaterally, CRT less than 3 seconds Bony prominence present on the plantar medial aspect of the foot from chronic foot deformity.  Hyperkeratotic lesion with dried blood present.  Upon debridement the area is almost completely healed and was a small skin fissure still present but is superficial.  There is no probing, undermining or tunneling.  There is no surrounding erythema today and there is no ascending cellulitis.  There is no fluctuation crepitation.  There is no malodor. No further open lesions or pre-ulcerative lesions.  No pain with calf compression, swelling, warmth, erythema  Assessment: Ulceration right foot-with improvement  Plan: -All treatment options discussed with the patient including all alternatives, risks, complications.  -Sharply debrided the wound today utilizing the 312 with scalpel any complications, healthy, viable tissue.  Continue a small amount of antibiotic ointment to the area daily.  Awaiting brace as I think she will be more beneficial for him to help offload and prevent further skin breakdown. -Monitor for any clinical signs or symptoms of infection and directed to call the office immediately should any occur or go to the ER.  Return in about 3 weeks (around 08/13/2019).  Trula Slade DPM

## 2019-07-25 DIAGNOSIS — D62 Acute posthemorrhagic anemia: Secondary | ICD-10-CM | POA: Diagnosis not present

## 2019-07-25 DIAGNOSIS — K921 Melena: Secondary | ICD-10-CM | POA: Diagnosis not present

## 2019-07-25 DIAGNOSIS — R2689 Other abnormalities of gait and mobility: Secondary | ICD-10-CM | POA: Diagnosis not present

## 2019-07-25 DIAGNOSIS — T45511D Poisoning by anticoagulants, accidental (unintentional), subsequent encounter: Secondary | ICD-10-CM | POA: Diagnosis not present

## 2019-07-25 DIAGNOSIS — E1140 Type 2 diabetes mellitus with diabetic neuropathy, unspecified: Secondary | ICD-10-CM | POA: Diagnosis not present

## 2019-07-25 DIAGNOSIS — G3184 Mild cognitive impairment, so stated: Secondary | ICD-10-CM | POA: Diagnosis not present

## 2019-07-31 DIAGNOSIS — I10 Essential (primary) hypertension: Secondary | ICD-10-CM | POA: Diagnosis not present

## 2019-08-13 ENCOUNTER — Ambulatory Visit (INDEPENDENT_AMBULATORY_CARE_PROVIDER_SITE_OTHER): Payer: Medicare Other | Admitting: Podiatry

## 2019-08-13 ENCOUNTER — Ambulatory Visit (INDEPENDENT_AMBULATORY_CARE_PROVIDER_SITE_OTHER): Payer: Medicare Other | Admitting: Orthotics

## 2019-08-13 ENCOUNTER — Other Ambulatory Visit: Payer: Self-pay

## 2019-08-13 VITALS — Temp 97.6°F

## 2019-08-13 DIAGNOSIS — E1149 Type 2 diabetes mellitus with other diabetic neurological complication: Secondary | ICD-10-CM | POA: Diagnosis not present

## 2019-08-13 DIAGNOSIS — L97511 Non-pressure chronic ulcer of other part of right foot limited to breakdown of skin: Secondary | ICD-10-CM | POA: Diagnosis not present

## 2019-08-13 DIAGNOSIS — M79674 Pain in right toe(s): Secondary | ICD-10-CM | POA: Diagnosis not present

## 2019-08-13 DIAGNOSIS — L97512 Non-pressure chronic ulcer of other part of right foot with fat layer exposed: Secondary | ICD-10-CM | POA: Diagnosis not present

## 2019-08-13 DIAGNOSIS — M79675 Pain in left toe(s): Secondary | ICD-10-CM

## 2019-08-13 DIAGNOSIS — I2510 Atherosclerotic heart disease of native coronary artery without angina pectoris: Secondary | ICD-10-CM

## 2019-08-13 NOTE — Progress Notes (Signed)
Patient came in today to pick up mezo Afo brace.  Patient was evaluated for fit and function.   The brace fit very well and there were any complaints of the way it felt once donned.  The brace offered ankle stability in both saggital and coroneal planes.  Patient advised to always wear proper fitting shoes with brace.

## 2019-08-14 NOTE — Progress Notes (Signed)
Subjective: 86 year old male presents the office today for follow-up evaluation of a wound on the right foot.  States that he is doing well.  Denies any drainage or pus or any swelling or redness.  Having no pain.  He has no other concerns today. Denies any systemic complaints such as fevers, chills, nausea, vomiting. No acute changes since last appointment, and no other complaints at this time.   Objective: AAO x3, NAD-presents with daughter DP/PT pulses palpable bilaterally, CRT less than 3 seconds Bony prominence present on the plantar medial aspect of the foot from chronic foot deformity.  Hyperkeratotic lesion present on the plantar medial midfoot on this area without any underlying ulceration drainage or signs of infection today.  There is no surrounding erythema, ascending cellulitis.  There is no fluctuance or crepitation.  There is no malodor. No further open lesions or pre-ulcerative lesions.  No pain with calf compression, swelling, warmth, erythema  Assessment: Ulceration right foot-with improvement  Plan: -All treatment options discussed with the patient including all alternatives, risks, complications.  -Debrided hyperkeratotic tissue without any complications or bleeding.  Continue moisturizer daily as well as offloading. -He followed up with Liliane Channel today for a brace to further offload.  Return in about 6 weeks (around 09/24/2019).  Trula Slade DPM

## 2019-08-19 DIAGNOSIS — I10 Essential (primary) hypertension: Secondary | ICD-10-CM | POA: Diagnosis not present

## 2019-08-19 DIAGNOSIS — R231 Pallor: Secondary | ICD-10-CM | POA: Diagnosis not present

## 2019-08-19 DIAGNOSIS — D500 Iron deficiency anemia secondary to blood loss (chronic): Secondary | ICD-10-CM | POA: Diagnosis not present

## 2019-08-22 ENCOUNTER — Telehealth: Payer: Self-pay

## 2019-08-22 NOTE — Telephone Encounter (Signed)
Called patient to follow up on CPAP supply orders that were dropped off to our office. Orders are not being ordered by MD. Called patient to follow up on where orders are supposed to go. Patient's spouse would like orders faxed to PCP. [place]

## 2019-09-03 DIAGNOSIS — D500 Iron deficiency anemia secondary to blood loss (chronic): Secondary | ICD-10-CM | POA: Diagnosis not present

## 2019-09-10 NOTE — Patient Instructions (Addendum)
DUE TO COVID-19 ONLY ONE VISITOR IS ALLOWED TO COME WITH YOU AND STAY IN THE WAITING ROOM ONLY DURING PRE OP AND PROCEDURE DAY OF SURGERY. THE 1 VISITOR MAY VISIT WITH YOU AFTER SURGERY IN YOUR PRIVATE ROOM DURING VISITING HOURS ONLY!  YOU NEED TO HAVE A COVID 19 TEST ON_5/20______ @__11 :05_____, THIS TEST MUST BE DONE BEFORE SURGERY, COME  [address], [place]  , [address].  ([place]) ONCE YOUR COVID TEST IS COMPLETED, PLEASE BEGIN THE QUARANTINE INSTRUCTIONS AS OUTLINED IN YOUR HANDOUT.                [name]    Your procedure is scheduled on: 09/16/19   Report to [facility] Main  Entrance   Report to admitting at   6:45 AM     Call this number if you have problems the morning of surgery [phone]    Remember: Do not eat food or drink liquids :After Midnight.   BRUSH YOUR TEETH MORNING OF SURGERY AND RINSE YOUR MOUTH OUT, NO CHEWING GUM CANDY OR MINTS.     Take these medicines the morning of surgery with A SIP OF WATER: Gabapentin, Zoloft, Zyrtec, Allopurinol, use your eye drops  DO NOT TAKE ANY DIABETIC MEDICATIONS DAY OF YOUR SURGERY  How to Manage Your Diabetes Before and After Surgery  Why is it important to control my blood sugar before and after surgery? . Improving blood sugar levels before and after surgery helps healing and can limit problems. . A way of improving blood sugar control is eating a healthy diet by: o  Eating less sugar and carbohydrates o  Increasing activity/exercise o  Talking with your doctor about reaching your blood sugar goals . High blood sugars (greater than 180 mg/dL) can raise your risk of infections and slow your recovery, so you will need to focus on controlling your diabetes during the weeks before surgery. . Make sure that the doctor who takes care of your diabetes knows about your planned surgery including the date and location.  How do I manage my blood sugar before surgery? . Check your  blood sugar at least 4 times a day, starting 2 days before surgery, to make sure that the level is not too high or low. o Check your blood sugar the morning of your surgery when you wake up and every 2 hours until you get to the Short Stay unit. . If your blood sugar is less than 70 mg/dL, you will need to treat for low blood sugar: o Do not take insulin. o Treat a low blood sugar (less than 70 mg/dL) with  cup of clear juice (cranberry or apple), 4 glucose tablets, OR glucose gel. o Recheck blood sugar in 15 minutes after treatment (to make sure it is greater than 70 mg/dL). If your blood sugar is not greater than 70 mg/dL on recheck, call [phone] for further instructions. . Report your blood sugar to the short stay nurse when you get to Short Stay.  . If you are admitted to the hospital after surgery: o Your blood sugar will be checked by the staff and you will probably be given insulin after surgery (instead of oral diabetes medicines) to make sure you have good blood sugar levels. o The goal for blood sugar control after surgery is 80-180 mg/dL.   WHAT DO I DO ABOUT MY DIABETES MEDICATION?  Marland Kitchen Do not take oral diabetes medicines (pills) the morning of surgery.        Marland Kitchen  THE MORNING OF SURGERY, take 10  units of [place]o   insulin.  . The day of surgery, do not take other diabetes injectables, including Byetta (exenatide), Bydureon (exenatide ER), Victoza (liraglutide), or Trulicity (dulaglutide).  . If your CBG is greater than 220 mg/dL, you may take  of your sliding scale  . (correction) dose of insulin.                                    You may not have any metal on your body including               piercings  Do not wear jewelry,lotions, powders or deodorant                          Men may shave face and neck.   Do not bring valuables to the hospital. [place].  Contacts, dentures or bridgework may not be worn into  surgery.       Patients discharged the day of surgery will not be allowed to drive home  . IF YOU ARE HAVING SURGERY AND GOING HOME THE SAME DAY, YOU MUST HAVE AN ADULT TO DRIVE YOU HOME AND BE WITH YOU FOR 24 HOURS.   YOU MAY GO HOME BY TAXI OR UBER OR ORTHERWISE, BUT AN ADULT MUST ACCOMPANY YOU HOME AND STAY WITH YOU FOR 24 HOURS.  Name and phone number of your driver:  Special Instructions: N/A              Please read over the following fact sheets you were given: _____________________________________________________________________             [facility] - Preparing for Surgery  Before surgery, you can play an important role .  Because skin is not sterile, your skin needs to be as free of germs as possible.   You can reduce the number of germs on your skin by washing with CHG (chlorahexidine gluconate) soap before surgery.   CHG is an antiseptic cleaner which kills germs and bonds with the skin to continue killing germs even after washing. Please DO NOT use if you have an allergy to CHG or antibacterial soaps.   If your skin becomes reddened/irritated stop using the CHG and inform your nurse when you arrive at Short Stay.    You may shave your face/neck. Please follow these instructions carefully:  1.  Shower with CHG Soap the night before surgery and the  morning of Surgery.  2.  If you choose to wash your hair, wash your hair first as usual with your  normal  shampoo.  3.  After you shampoo, rinse your hair and body thoroughly to remove the  shampoo.                                        4.  Use CHG as you would any other liquid soap.  You can apply chg directly  to the skin and wash                       Gently with a scrungie or clean washcloth.  5.  Apply the CHG  Soap to your body ONLY FROM THE NECK DOWN.   Do not use on face/ open                           Wound or open sores. Avoid contact with eyes, ears mouth and genitals (private parts).                       Wash  face,  Genitals (private parts) with your normal soap.             6.  Wash thoroughly, paying special attention to the area where your surgery  will be performed.  7.  Thoroughly rinse your body with warm water from the neck down.  8.  DO NOT shower/wash with your normal soap after using and rinsing off  the CHG Soap.             9.  Pat yourself dry with a clean towel.            10.  Wear clean pajamas.            11.  Place clean sheets on your bed the night of your first shower and do not  sleep with pets. Day of Surgery : Do not apply any lotions/deodorants the morning of surgery.  Please wear clean clothes to the hospital/surgery center.  FAILURE TO FOLLOW THESE INSTRUCTIONS MAY RESULT IN THE CANCELLATION OF YOUR SURGERY PATIENT SIGNATURE_________________________________  NURSE SIGNATURE__________________________________  ________________________________________________________________________

## 2019-09-11 ENCOUNTER — Encounter (HOSPITAL_COMMUNITY)
Admission: RE | Admit: 2019-09-11 | Discharge: 2019-09-11 | Disposition: A | Discharge status: Home / Self Care | Payer: Medicare Other | Source: Ambulatory Visit | Attending: Urology | Admitting: Urology

## 2019-09-11 ENCOUNTER — Encounter (HOSPITAL_COMMUNITY): Payer: Self-pay

## 2019-09-11 ENCOUNTER — Encounter (HOSPITAL_COMMUNITY): Payer: Medicare Other

## 2019-09-11 ENCOUNTER — Inpatient Hospital Stay (HOSPITAL_COMMUNITY)
Admission: EM | Admit: 2019-09-11 | Discharge: 2019-09-17 | DRG: 853 | Disposition: A | Discharge status: Hospice | Payer: Medicare Other | Attending: Internal Medicine | Admitting: Internal Medicine

## 2019-09-11 ENCOUNTER — Other Ambulatory Visit: Payer: Self-pay

## 2019-09-11 ENCOUNTER — Encounter (HOSPITAL_COMMUNITY): Payer: Self-pay | Admitting: Emergency Medicine

## 2019-09-11 DIAGNOSIS — G9341 Metabolic encephalopathy: Secondary | ICD-10-CM | POA: Diagnosis not present

## 2019-09-11 DIAGNOSIS — E1142 Type 2 diabetes mellitus with diabetic polyneuropathy: Secondary | ICD-10-CM | POA: Diagnosis present

## 2019-09-11 DIAGNOSIS — E860 Dehydration: Secondary | ICD-10-CM | POA: Diagnosis present

## 2019-09-11 DIAGNOSIS — C8590 Non-Hodgkin lymphoma, unspecified, unspecified site: Secondary | ICD-10-CM | POA: Diagnosis present

## 2019-09-11 DIAGNOSIS — D649 Anemia, unspecified: Secondary | ICD-10-CM | POA: Diagnosis present

## 2019-09-11 DIAGNOSIS — G4733 Obstructive sleep apnea (adult) (pediatric): Secondary | ICD-10-CM | POA: Diagnosis present

## 2019-09-11 DIAGNOSIS — Z8616 Personal history of COVID-19: Secondary | ICD-10-CM

## 2019-09-11 DIAGNOSIS — I10 Essential (primary) hypertension: Secondary | ICD-10-CM | POA: Diagnosis present

## 2019-09-11 DIAGNOSIS — Z66 Do not resuscitate: Secondary | ICD-10-CM | POA: Diagnosis not present

## 2019-09-11 DIAGNOSIS — Z515 Encounter for palliative care: Secondary | ICD-10-CM

## 2019-09-11 DIAGNOSIS — G629 Polyneuropathy, unspecified: Secondary | ICD-10-CM

## 2019-09-11 DIAGNOSIS — E876 Hypokalemia: Secondary | ICD-10-CM | POA: Diagnosis present

## 2019-09-11 DIAGNOSIS — Z9221 Personal history of antineoplastic chemotherapy: Secondary | ICD-10-CM

## 2019-09-11 DIAGNOSIS — Z87891 Personal history of nicotine dependence: Secondary | ICD-10-CM

## 2019-09-11 DIAGNOSIS — Z9079 Acquired absence of other genital organ(s): Secondary | ICD-10-CM

## 2019-09-11 DIAGNOSIS — A4181 Sepsis due to Enterococcus: Principal | ICD-10-CM | POA: Diagnosis present

## 2019-09-11 DIAGNOSIS — N136 Pyonephrosis: Secondary | ICD-10-CM | POA: Diagnosis present

## 2019-09-11 DIAGNOSIS — Z794 Long term (current) use of insulin: Secondary | ICD-10-CM

## 2019-09-11 DIAGNOSIS — D62 Acute posthemorrhagic anemia: Secondary | ICD-10-CM | POA: Diagnosis present

## 2019-09-11 DIAGNOSIS — M109 Gout, unspecified: Secondary | ICD-10-CM | POA: Diagnosis present

## 2019-09-11 DIAGNOSIS — N390 Urinary tract infection, site not specified: Secondary | ICD-10-CM | POA: Diagnosis not present

## 2019-09-11 DIAGNOSIS — E11649 Type 2 diabetes mellitus with hypoglycemia without coma: Secondary | ICD-10-CM | POA: Diagnosis present

## 2019-09-11 DIAGNOSIS — E782 Mixed hyperlipidemia: Secondary | ICD-10-CM | POA: Diagnosis present

## 2019-09-11 DIAGNOSIS — C61 Malignant neoplasm of prostate: Secondary | ICD-10-CM | POA: Diagnosis not present

## 2019-09-11 DIAGNOSIS — Z85828 Personal history of other malignant neoplasm of skin: Secondary | ICD-10-CM

## 2019-09-11 DIAGNOSIS — N183 Chronic kidney disease, stage 3 unspecified: Secondary | ICD-10-CM | POA: Diagnosis present

## 2019-09-11 DIAGNOSIS — R410 Disorientation, unspecified: Secondary | ICD-10-CM | POA: Diagnosis not present

## 2019-09-11 DIAGNOSIS — F329 Major depressive disorder, single episode, unspecified: Secondary | ICD-10-CM | POA: Diagnosis present

## 2019-09-11 DIAGNOSIS — Z1612 Extended spectrum beta lactamase (ESBL) resistance: Secondary | ICD-10-CM | POA: Diagnosis present

## 2019-09-11 DIAGNOSIS — A419 Sepsis, unspecified organism: Secondary | ICD-10-CM | POA: Diagnosis present

## 2019-09-11 DIAGNOSIS — R4182 Altered mental status, unspecified: Secondary | ICD-10-CM | POA: Diagnosis not present

## 2019-09-11 DIAGNOSIS — Z79899 Other long term (current) drug therapy: Secondary | ICD-10-CM

## 2019-09-11 DIAGNOSIS — I480 Paroxysmal atrial fibrillation: Secondary | ICD-10-CM | POA: Diagnosis present

## 2019-09-11 DIAGNOSIS — Z8546 Personal history of malignant neoplasm of prostate: Secondary | ICD-10-CM

## 2019-09-11 DIAGNOSIS — E1110 Type 2 diabetes mellitus with ketoacidosis without coma: Secondary | ICD-10-CM | POA: Diagnosis present

## 2019-09-11 DIAGNOSIS — K219 Gastro-esophageal reflux disease without esophagitis: Secondary | ICD-10-CM | POA: Diagnosis present

## 2019-09-11 DIAGNOSIS — Z87442 Personal history of urinary calculi: Secondary | ICD-10-CM

## 2019-09-11 DIAGNOSIS — Z951 Presence of aortocoronary bypass graft: Secondary | ICD-10-CM

## 2019-09-11 DIAGNOSIS — Z20822 Contact with and (suspected) exposure to covid-19: Secondary | ICD-10-CM | POA: Diagnosis not present

## 2019-09-11 DIAGNOSIS — R8279 Other abnormal findings on microbiological examination of urine: Secondary | ICD-10-CM | POA: Diagnosis not present

## 2019-09-11 DIAGNOSIS — I2510 Atherosclerotic heart disease of native coronary artery without angina pectoris: Secondary | ICD-10-CM | POA: Diagnosis present

## 2019-09-11 DIAGNOSIS — Z8505 Personal history of malignant neoplasm of liver: Secondary | ICD-10-CM

## 2019-09-11 LAB — CBC WITH DIFFERENTIAL/PLATELET
Abs Immature Granulocytes: 0.2 10*3/uL — ABNORMAL HIGH (ref 0.00–0.07)
Basophils Absolute: 0 10*3/uL (ref 0.0–0.1)
Basophils Relative: 0 %
Eosinophils Absolute: 0 10*3/uL (ref 0.0–0.5)
Eosinophils Relative: 0 %
HCT: 24.8 % — ABNORMAL LOW (ref 39.0–52.0)
Hemoglobin: 7.6 g/dL — ABNORMAL LOW (ref 13.0–17.0)
Immature Granulocytes: 3 %
Lymphocytes Relative: 1 %
Lymphs Abs: 0.1 10*3/uL — ABNORMAL LOW (ref 0.7–4.0)
MCH: 27.7 pg (ref 26.0–34.0)
MCHC: 30.6 g/dL (ref 30.0–36.0)
MCV: 90.5 fL (ref 80.0–100.0)
Monocytes Absolute: 0.4 10*3/uL (ref 0.1–1.0)
Monocytes Relative: 5 %
Neutro Abs: 6.8 10*3/uL (ref 1.7–7.7)
Neutrophils Relative %: 91 %
Platelets: 127 10*3/uL — ABNORMAL LOW (ref 150–400)
RBC: 2.74 MIL/uL — ABNORMAL LOW (ref 4.22–5.81)
RDW: 18.6 % — ABNORMAL HIGH (ref 11.5–15.5)
WBC: 7.4 10*3/uL (ref 4.0–10.5)
nRBC: 0 % (ref 0.0–0.2)

## 2019-09-11 LAB — LACTIC ACID, PLASMA: Lactic Acid, Venous: 0.6 mmol/L (ref 0.5–1.9)

## 2019-09-11 LAB — COMPREHENSIVE METABOLIC PANEL
ALT: 41 U/L (ref 0–44)
AST: 90 U/L — ABNORMAL HIGH (ref 15–41)
Albumin: 2.4 g/dL — ABNORMAL LOW (ref 3.5–5.0)
Alkaline Phosphatase: 77 U/L (ref 38–126)
Anion gap: 8 (ref 5–15)
BUN: 31 mg/dL — ABNORMAL HIGH (ref 8–23)
CO2: 23 mmol/L (ref 22–32)
Calcium: 8.8 mg/dL — ABNORMAL LOW (ref 8.9–10.3)
Chloride: 106 mmol/L (ref 98–111)
Creatinine, Ser: 1.18 mg/dL (ref 0.61–1.24)
GFR calc Af Amer: >60 mL/min (ref >60)
GFR calc non Af Amer: 56 mL/min — ABNORMAL LOW (ref >60)
Glucose, Bld: 84 mg/dL (ref 70–99)
Potassium: 3.8 mmol/L (ref 3.5–5.1)
Sodium: 137 mmol/L (ref 135–145)
Total Bilirubin: 0.9 mg/dL (ref 0.3–1.2)
Total Protein: 5.1 g/dL — ABNORMAL LOW (ref 6.5–8.1)

## 2019-09-11 LAB — BLOOD GAS, VENOUS
Acid-base deficit: 0.5 mmol/L (ref 0.0–2.0)
Bicarbonate: 23.4 mmol/L (ref 20.0–28.0)
O2 Saturation: 68.8 %
Patient temperature: 98.6
pCO2, Ven: 37.2 mmHg — ABNORMAL LOW (ref 44.0–60.0)
pH, Ven: 7.414 (ref 7.250–7.430)
pO2, Ven: 40 mmHg (ref 32.0–45.0)

## 2019-09-11 LAB — POC OCCULT BLOOD, ED: Fecal Occult Bld: NEGATIVE

## 2019-09-11 MED ORDER — OXYCODONE HCL 5 MG PO TABS: 5.0000 mg | ORAL_TABLET | ORAL | Status: DC | PRN

## 2019-09-11 MED ORDER — SODIUM CHLORIDE 0.9 % IV BOLUS
500.0000 mL | Freq: Once | INTRAVENOUS | Status: AC
  Administered 2019-09-11: 500 mL via INTRAVENOUS

## 2019-09-11 MED ORDER — SENNOSIDES-DOCUSATE SODIUM 8.6-50 MG PO TABS: 2.0000 | ORAL_TABLET | Freq: Every evening | ORAL | Status: DC | PRN

## 2019-09-11 MED ORDER — POLYETHYLENE GLYCOL 3350 17 G PO PACK: 17.0000 g | PACK | Freq: Every day | ORAL | Status: DC | PRN

## 2019-09-11 MED ORDER — SODIUM CHLORIDE 0.9 % IV SOLN
1.0000 g | Freq: Once | INTRAVENOUS | Status: AC
  Administered 2019-09-11: 1 g via INTRAVENOUS
  Filled 2019-09-11: qty 10

## 2019-09-11 MED ORDER — SODIUM CHLORIDE 0.9 % IV SOLN: INTRAVENOUS | Status: DC

## 2019-09-11 MED ORDER — HYDRALAZINE HCL 20 MG/ML IJ SOLN: 10.0000 mg | INTRAMUSCULAR | Status: DC | PRN

## 2019-09-11 MED ORDER — SODIUM CHLORIDE 0.9 % IV BOLUS
1000.0000 mL | Freq: Once | INTRAVENOUS | Status: AC
  Administered 2019-09-11: 1000 mL via INTRAVENOUS

## 2019-09-11 NOTE — ED Triage Notes (Signed)
Pt's daughter states that patient has stents in kidneys that was supposed to have replaced next week but at doctor office today pt was very weak, Bp was low and concerned about TIA with confusion that started 3 days ago with urinating on himself and color doesnt look  at his baseline.  Cancer center did a UA on patient will he was there. Daughter states that patient has DNR and he hasnt wanted to go to hospital so they didn't bring him but today decided would be seen when advised by cancer center.
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
## 2019-09-11 NOTE — Progress Notes (Signed)
PCP - Dr. Audie Pinto Cardiologist - Dr. Jerilynn Mages. Croitoru  Chest x-ray - 04/03/19 EKG - 07/04/19 Stress Test - no ECHO - 11/09/18 Cardiac Cath - 1998, 2014  Sleep Study - yes CPAP - yes  Fasting Blood Sugar - 100-110 Checks Blood Sugar __BID___ times a day  Blood Thinner Instructions:NA. Pt was taken off Xarelto because of recent GI bleeding Aspirin Instructions: Last Dose:  Anesthesia review:   Patient denies shortness of breath, fever, cough and chest pain at PAT appointment yes  Patient verbalized understanding of instructions that were given to them at the PAT appointment. Patient was also instructed that they will need to review over the PAT instructions again at home before surgery. Yes I spoke with his daughter Mrs. Pegrum. She , her sister and his care giver are concerned that he may have had a stroke over the weekend. He seems more debilitated than he has been. They placed a call to his PCP and Dr. Lynne Logan office. Dr. [name] will see him today to assess if the pt should move forward with the surgery.

## 2019-09-12 ENCOUNTER — Other Ambulatory Visit (HOSPITAL_COMMUNITY): Payer: Medicare Other

## 2019-09-12 DIAGNOSIS — K219 Gastro-esophageal reflux disease without esophagitis: Secondary | ICD-10-CM | POA: Diagnosis present

## 2019-09-12 DIAGNOSIS — N390 Urinary tract infection, site not specified: Secondary | ICD-10-CM | POA: Diagnosis present

## 2019-09-12 DIAGNOSIS — M109 Gout, unspecified: Secondary | ICD-10-CM | POA: Diagnosis present

## 2019-09-12 DIAGNOSIS — E876 Hypokalemia: Secondary | ICD-10-CM | POA: Diagnosis present

## 2019-09-12 DIAGNOSIS — C799 Secondary malignant neoplasm of unspecified site: Secondary | ICD-10-CM | POA: Diagnosis not present

## 2019-09-12 DIAGNOSIS — Z1612 Extended spectrum beta lactamase (ESBL) resistance: Secondary | ICD-10-CM | POA: Diagnosis present

## 2019-09-12 DIAGNOSIS — E1142 Type 2 diabetes mellitus with diabetic polyneuropathy: Secondary | ICD-10-CM | POA: Diagnosis present

## 2019-09-12 DIAGNOSIS — E11649 Type 2 diabetes mellitus with hypoglycemia without coma: Secondary | ICD-10-CM | POA: Diagnosis present

## 2019-09-12 DIAGNOSIS — C8228 Follicular lymphoma grade III, unspecified, lymph nodes of multiple sites: Secondary | ICD-10-CM | POA: Diagnosis not present

## 2019-09-12 DIAGNOSIS — Z9079 Acquired absence of other genital organ(s): Secondary | ICD-10-CM | POA: Diagnosis not present

## 2019-09-12 DIAGNOSIS — C61 Malignant neoplasm of prostate: Secondary | ICD-10-CM | POA: Diagnosis not present

## 2019-09-12 DIAGNOSIS — A419 Sepsis, unspecified organism: Secondary | ICD-10-CM | POA: Diagnosis not present

## 2019-09-12 DIAGNOSIS — N131 Hydronephrosis with ureteral stricture, not elsewhere classified: Secondary | ICD-10-CM | POA: Diagnosis not present

## 2019-09-12 DIAGNOSIS — N135 Crossing vessel and stricture of ureter without hydronephrosis: Secondary | ICD-10-CM | POA: Diagnosis not present

## 2019-09-12 DIAGNOSIS — Z951 Presence of aortocoronary bypass graft: Secondary | ICD-10-CM | POA: Diagnosis not present

## 2019-09-12 DIAGNOSIS — I2510 Atherosclerotic heart disease of native coronary artery without angina pectoris: Secondary | ICD-10-CM | POA: Diagnosis present

## 2019-09-12 DIAGNOSIS — R4182 Altered mental status, unspecified: Secondary | ICD-10-CM | POA: Diagnosis not present

## 2019-09-12 DIAGNOSIS — Z9221 Personal history of antineoplastic chemotherapy: Secondary | ICD-10-CM | POA: Diagnosis not present

## 2019-09-12 DIAGNOSIS — N136 Pyonephrosis: Secondary | ICD-10-CM | POA: Diagnosis present

## 2019-09-12 DIAGNOSIS — Z515 Encounter for palliative care: Secondary | ICD-10-CM | POA: Diagnosis present

## 2019-09-12 DIAGNOSIS — Z66 Do not resuscitate: Secondary | ICD-10-CM | POA: Diagnosis present

## 2019-09-12 DIAGNOSIS — B961 Klebsiella pneumoniae [K. pneumoniae] as the cause of diseases classified elsewhere: Secondary | ICD-10-CM | POA: Diagnosis not present

## 2019-09-12 DIAGNOSIS — Z7189 Other specified counseling: Secondary | ICD-10-CM | POA: Diagnosis not present

## 2019-09-12 DIAGNOSIS — I480 Paroxysmal atrial fibrillation: Secondary | ICD-10-CM | POA: Diagnosis present

## 2019-09-12 DIAGNOSIS — Z8616 Personal history of COVID-19: Secondary | ICD-10-CM | POA: Diagnosis not present

## 2019-09-12 DIAGNOSIS — D62 Acute posthemorrhagic anemia: Secondary | ICD-10-CM | POA: Diagnosis present

## 2019-09-12 DIAGNOSIS — E782 Mixed hyperlipidemia: Secondary | ICD-10-CM | POA: Diagnosis present

## 2019-09-12 DIAGNOSIS — I10 Essential (primary) hypertension: Secondary | ICD-10-CM | POA: Diagnosis present

## 2019-09-12 DIAGNOSIS — A4181 Sepsis due to Enterococcus: Secondary | ICD-10-CM | POA: Diagnosis present

## 2019-09-12 DIAGNOSIS — C8590 Non-Hodgkin lymphoma, unspecified, unspecified site: Secondary | ICD-10-CM | POA: Diagnosis present

## 2019-09-12 DIAGNOSIS — Z87891 Personal history of nicotine dependence: Secondary | ICD-10-CM | POA: Diagnosis not present

## 2019-09-12 DIAGNOSIS — G9341 Metabolic encephalopathy: Secondary | ICD-10-CM | POA: Diagnosis present

## 2019-09-12 DIAGNOSIS — Z79899 Other long term (current) drug therapy: Secondary | ICD-10-CM | POA: Diagnosis not present

## 2019-09-12 DIAGNOSIS — Z794 Long term (current) use of insulin: Secondary | ICD-10-CM | POA: Diagnosis not present

## 2019-09-12 LAB — CBC
HCT: 27.4 % — ABNORMAL LOW (ref 39.0–52.0)
Hemoglobin: 8.7 g/dL — ABNORMAL LOW (ref 13.0–17.0)
MCH: 28.6 pg (ref 26.0–34.0)
MCHC: 31.8 g/dL (ref 30.0–36.0)
MCV: 90.1 fL (ref 80.0–100.0)
Platelets: 126 10*3/uL — ABNORMAL LOW (ref 150–400)
RBC: 3.04 MIL/uL — ABNORMAL LOW (ref 4.22–5.81)
RDW: 17.4 % — ABNORMAL HIGH (ref 11.5–15.5)
WBC: 8.5 10*3/uL (ref 4.0–10.5)
nRBC: 0 % (ref 0.0–0.2)

## 2019-09-12 LAB — GLUCOSE, CAPILLARY
Glucose-Capillary: 62 mg/dL — ABNORMAL LOW (ref 70–99)
Glucose-Capillary: 106 mg/dL — ABNORMAL HIGH (ref 70–99)
Glucose-Capillary: 68 mg/dL — ABNORMAL LOW (ref 70–99)
Glucose-Capillary: 60 mg/dL — ABNORMAL LOW (ref 70–99)
Glucose-Capillary: 62 mg/dL — ABNORMAL LOW (ref 70–99)
Glucose-Capillary: 109 mg/dL — ABNORMAL HIGH (ref 70–99)
Glucose-Capillary: 112 mg/dL — ABNORMAL HIGH (ref 70–99)
Glucose-Capillary: 111 mg/dL — ABNORMAL HIGH (ref 70–99)
Glucose-Capillary: 240 mg/dL — ABNORMAL HIGH (ref 70–99)

## 2019-09-12 LAB — HEMOGLOBIN A1C
Hgb A1c MFr Bld: 5.5 % (ref 4.8–5.6)
Mean Plasma Glucose: 111.15 mg/dL

## 2019-09-12 LAB — BASIC METABOLIC PANEL
Anion gap: 7 (ref 5–15)
BUN: 24 mg/dL — ABNORMAL HIGH (ref 8–23)
CO2: 21 mmol/L — ABNORMAL LOW (ref 22–32)
Calcium: 8 mg/dL — ABNORMAL LOW (ref 8.9–10.3)
Chloride: 107 mmol/L (ref 98–111)
Creatinine, Ser: 0.93 mg/dL (ref 0.61–1.24)
GFR calc Af Amer: >60 mL/min (ref >60)
GFR calc non Af Amer: >60 mL/min (ref >60)
Glucose, Bld: 65 mg/dL — ABNORMAL LOW (ref 70–99)
Potassium: 3.4 mmol/L — ABNORMAL LOW (ref 3.5–5.1)
Sodium: 135 mmol/L (ref 135–145)

## 2019-09-12 LAB — PREPARE RBC (CROSSMATCH)

## 2019-09-12 LAB — TSH: TSH: 8.592 u[IU]/mL — ABNORMAL HIGH (ref 0.350–4.500)

## 2019-09-12 LAB — MAGNESIUM: Magnesium: 1.8 mg/dL (ref 1.7–2.4)

## 2019-09-12 LAB — SARS CORONAVIRUS 2 BY RT PCR (HOSPITAL ORDER, PERFORMED IN ~~LOC~~ HOSPITAL LAB): SARS Coronavirus 2: NEGATIVE

## 2019-09-12 MED ORDER — DEXTROSE 50 % IV SOLN: 25.0000 mL | Freq: Once | INTRAVENOUS | Status: DC

## 2019-09-12 MED ORDER — SENNOSIDES-DOCUSATE SODIUM 8.6-50 MG PO TABS: 1.0000 | ORAL_TABLET | Freq: Two times a day (BID) | ORAL | Status: DC | PRN

## 2019-09-12 MED ORDER — DEXTROSE-NACL 5-0.45 % IV SOLN: INTRAVENOUS | Status: DC

## 2019-09-12 MED ORDER — ADULT MULTIVITAMIN W/MINERALS CH
1.0000 | ORAL_TABLET | Freq: Every day | ORAL | Status: DC
  Administered 2019-09-12 – 2019-09-13 (×2): 1 via ORAL
  Filled 2019-09-12 (×2): qty 1

## 2019-09-12 MED ORDER — ACETAMINOPHEN 325 MG PO TABS: 650.0000 mg | ORAL_TABLET | Freq: Four times a day (QID) | ORAL | Status: DC | PRN

## 2019-09-12 MED ORDER — LIDOCAINE-PRILOCAINE 2.5-2.5 % EX CREA
1.0000 "application " | TOPICAL_CREAM | CUTANEOUS | Status: DC | PRN
  Filled 2019-09-12: qty 5

## 2019-09-12 MED ORDER — GABAPENTIN 300 MG PO CAPS
300.0000 mg | ORAL_CAPSULE | Freq: Two times a day (BID) | ORAL | Status: DC
  Administered 2019-09-12 – 2019-09-17 (×10): 300 mg via ORAL
  Filled 2019-09-12 (×10): qty 1

## 2019-09-12 MED ORDER — LORATADINE 10 MG PO TABS
10.0000 mg | ORAL_TABLET | Freq: Every day | ORAL | Status: DC
  Administered 2019-09-12 – 2019-09-17 (×6): 10 mg via ORAL
  Filled 2019-09-12 (×6): qty 1

## 2019-09-12 MED ORDER — POLYETHYLENE GLYCOL 3350 17 G PO PACK: 17.0000 g | PACK | Freq: Two times a day (BID) | ORAL | Status: DC | PRN

## 2019-09-12 MED ORDER — INSULIN ASPART 100 UNIT/ML ~~LOC~~ SOLN
0.0000 [IU] | Freq: Three times a day (TID) | SUBCUTANEOUS | Status: DC
  Filled 2019-09-12: qty 0.09

## 2019-09-12 MED ORDER — LEUPROLIDE ACETATE (3 MONTH) 22.5 MG IM KIT: 22.5000 mg | PACK | INTRAMUSCULAR | Status: DC

## 2019-09-12 MED ORDER — DEXTROSE 50 % IV SOLN
12.5000 g | INTRAVENOUS | Status: AC
  Administered 2019-09-12: 12.5 g via INTRAVENOUS

## 2019-09-12 MED ORDER — SODIUM CHLORIDE 0.9 % IV SOLN
2.0000 g | Freq: Once | INTRAVENOUS | Status: AC
  Administered 2019-09-12: 2 g via INTRAVENOUS
  Filled 2019-09-12: qty 2

## 2019-09-12 MED ORDER — PANCRELIPASE (LIP-PROT-AMYL) 12000-38000 UNITS PO CPEP
12000.0000 [IU] | ORAL_CAPSULE | Freq: Three times a day (TID) | ORAL | Status: DC
  Administered 2019-09-12 – 2019-09-17 (×15): 12000 [IU] via ORAL
  Filled 2019-09-12 (×16): qty 1

## 2019-09-12 MED ORDER — ATORVASTATIN CALCIUM 10 MG PO TABS: 20.0000 mg | ORAL_TABLET | Freq: Every evening | ORAL | Status: DC

## 2019-09-12 MED ORDER — SODIUM CHLORIDE 0.9% FLUSH
10.0000 mL | INTRAVENOUS | Status: DC | PRN
  Administered 2019-09-12: 10 mL

## 2019-09-12 MED ORDER — SERTRALINE HCL 100 MG PO TABS
100.0000 mg | ORAL_TABLET | Freq: Every day | ORAL | Status: DC
  Administered 2019-09-12 – 2019-09-17 (×6): 100 mg via ORAL
  Filled 2019-09-12 (×6): qty 1

## 2019-09-12 MED ORDER — ASCORBIC ACID 500 MG PO TABS
500.0000 mg | ORAL_TABLET | Freq: Every day | ORAL | Status: DC
  Administered 2019-09-12 – 2019-09-13 (×2): 500 mg via ORAL
  Filled 2019-09-12 (×2): qty 1

## 2019-09-12 MED ORDER — FERROUS SULFATE 325 (65 FE) MG PO TABS
325.0000 mg | ORAL_TABLET | Freq: Two times a day (BID) | ORAL | Status: DC
  Administered 2019-09-12 – 2019-09-17 (×11): 325 mg via ORAL
  Filled 2019-09-12 (×11): qty 1

## 2019-09-12 MED ORDER — SODIUM CHLORIDE 0.9 % IV SOLN
2.0000 g | Freq: Two times a day (BID) | INTRAVENOUS | Status: DC
  Administered 2019-09-12 – 2019-09-13 (×3): 2 g via INTRAVENOUS
  Filled 2019-09-12 (×3): qty 2

## 2019-09-12 MED ORDER — HEPARIN SODIUM (PORCINE) 5000 UNIT/ML IJ SOLN
5000.0000 [IU] | Freq: Three times a day (TID) | INTRAMUSCULAR | Status: DC
  Administered 2019-09-12 – 2019-09-17 (×11): 5000 [IU] via SUBCUTANEOUS
  Filled 2019-09-12 (×11): qty 1

## 2019-09-12 MED ORDER — CHOLESTYRAMINE LIGHT 4 G PO PACK
4.0000 g | PACK | Freq: Every day | ORAL | Status: DC | PRN
  Filled 2019-09-12: qty 1

## 2019-09-12 MED ORDER — ACETAMINOPHEN 650 MG RE SUPP: 650.0000 mg | Freq: Four times a day (QID) | RECTAL | Status: DC | PRN

## 2019-09-12 MED ORDER — LATANOPROST 0.005 % OP SOLN
1.0000 [drp] | Freq: Every day | OPHTHALMIC | Status: DC
  Administered 2019-09-12 – 2019-09-16 (×5): 1 [drp] via OPHTHALMIC
  Filled 2019-09-12: qty 2.5

## 2019-09-12 MED ORDER — FENOFIBRATE 160 MG PO TABS
160.0000 mg | ORAL_TABLET | Freq: Every evening | ORAL | Status: DC
  Administered 2019-09-12: 160 mg via ORAL
  Filled 2019-09-12 (×2): qty 1

## 2019-09-12 MED ORDER — DEXTROSE 50 % IV SOLN
INTRAVENOUS | Status: AC
  Filled 2019-09-12: qty 50

## 2019-09-12 MED ORDER — SODIUM CHLORIDE 0.9% IV SOLUTION: Freq: Once | INTRAVENOUS | Status: AC

## 2019-09-12 MED ORDER — ALLOPURINOL 300 MG PO TABS
300.0000 mg | ORAL_TABLET | Freq: Every day | ORAL | Status: DC
  Administered 2019-09-12 – 2019-09-17 (×6): 300 mg via ORAL
  Filled 2019-09-12 (×7): qty 1

## 2019-09-12 MED ORDER — VITAMIN B-12 1000 MCG PO TABS
2500.0000 ug | ORAL_TABLET | Freq: Every morning | ORAL | Status: DC
  Administered 2019-09-12 – 2019-09-17 (×6): 2500 ug via ORAL
  Filled 2019-09-12 (×6): qty 3
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
## 2019-09-12 NOTE — Progress Notes (Signed)
[place] TEAM 1 - Stepdown/ICU TEAM  [name]  ZTI:[id number] DOB: 11-06-1931 DOA: 09/11/2019 PCP: [name], MD    Brief Narrative:  86 year old with a history of paroxysmal atrial fibrillation no longer on Xarelto, CAD status post CABG, prostate cancer, non-Hodgkin's lymphoma, DM 2, and recurrent unidentified source of GI bleeding and hematuria who was sent to the ED from the urology clinic where he was found to have low blood pressure and altered mental status.  Significant Events: 5/20 admit via [facility]  Subjective: Pt is seen for a f/u visit.    Assessment & Plan:  Sepsis due to urinary tract infection  Hematuria -indwelling urinary stents  Acute metabolic encephalopathy  Acute on chronic blood loss anemia Baseline hemoglobin 9.5  Mild hypokalemia  DM 2 with peripheral neuropathy and modest hypoglycemia  HLD  CAD status post CABG  Gout  Goals of care The patient tells me he is tired of being hospitalized and that all he really cares about his being at home and being in his garden.  His daughter at the bedside states that he remains a little bit confused but that she does feel this is consistent with his true wishes.  Before we consider any interventions or further procedures I have asked palliative care to see the patient to more firmly establish his goals of care going forward and to help establish a plan to hopefully get him home perhaps with comfort focused care only.  For now we have agreed to continue conservative medical management but I will hold off on consulting urology for now.  DVT prophylaxis: SCDs Code Status: NO CODE BLUE Family Communication:  Disposition Plan:   Consultants:  none  Antimicrobials:  Cefepime 5/19 > Ceftriaxone 5/19  Objective: Blood pressure 110/60, pulse 72, temperature 98.2 F (36.8 C), temperature source Oral, resp. rate 18, height 5\' 7"  (1.702 m), weight 63.5 kg, SpO2 92 %.  Intake/Output Summary (Last  24 hours) at 09/12/2019 0936 Last data filed at 09/12/2019 0856 Gross per 24 hour  Intake 1339.17 ml  Output 300 ml  Net 1039.17 ml   Filed Weights   09/12/19 0039  Weight: 63.5 kg    Examination: Pt was seen for a f/u visit.    CBC: Recent Labs  Lab 09/11/19 1850  WBC 7.4  NEUTROABS 6.8  HGB 7.6*  HCT 24.8*  MCV 90.5  PLT 825*   Basic Metabolic Panel: Recent Labs  Lab 09/11/19 1850  NA 137  K 3.8  CL 106  CO2 23  GLUCOSE 84  BUN 31*  CREATININE 1.18  CALCIUM 8.8*   GFR: Estimated Creatinine Clearance: 41.1 mL/min (by C-G formula based on SCr of 1.18 mg/dL).  Liver Function Tests: Recent Labs  Lab 09/11/19 1850  AST 90*  ALT 41  ALKPHOS 77  BILITOT 0.9  PROT 5.1*  ALBUMIN 2.4*    HbA1C: Hgb A1c MFr Bld  Date/Time Value Ref Range Status  04/05/2019 05:00 AM 7.4 (H) 4.8 - 5.6 % Final    Comment:    (NOTE) Pre diabetes:          5.7%-6.4% Diabetes:              >6.4% Glycemic control for   <7.0% adults with diabetes   03/15/2019 09:19 AM 7.4 (H) 4.8 - 5.6 % Final    Comment:    (NOTE) Pre diabetes:          5.7%-6.4% Diabetes:              >  6.4% Glycemic control for   <7.0% adults with diabetes     CBG: Recent Labs  Lab 09/12/19 0201 09/12/19 0239 09/12/19 0501 09/12/19 0754 09/12/19 0902  GLUCAP 62* 106* 68* 60* 62*    Recent Results (from the past 240 hour(s))  Culture, blood (routine x 2)     Status: None (Preliminary result)   Collection Time: 09/11/19  8:20 PM   Specimen: BLOOD  Result Value Ref Range Status   Specimen Description   Final    BLOOD PORTA CATH Performed at [facility], [place] [address], [place], [place] [address]    Special Requests   Final    BOTTLES DRAWN AEROBIC AND ANAEROBIC Blood Culture adequate volume Performed at [place] [address] [place], [place] [address]    Culture   Final    NO GROWTH < 12 HOURS Performed at [place]  [address] [place], [place] [address]    Report Status PENDING  Incomplete  Culture, blood (routine x 2)     Status: None (Preliminary result)   Collection Time: 09/11/19  8:20 PM   Specimen: BLOOD  Result Value Ref Range Status   Specimen Description   Final    BLOOD LEFT ANTECUBITAL Performed at [place] [address], [place], [place] [address]    Special Requests   Final    BOTTLES DRAWN AEROBIC AND ANAEROBIC Blood Culture adequate volume Performed at [place] [address] [place], [place] [address]    Culture   Final    NO GROWTH < 12 HOURS Performed at [place] [address] [place], [place] [address]    Report Status PENDING  Incomplete  SARS Coronavirus 2 by RT PCR (hospital order, performed in [facility] hospital lab) Nasopharyngeal Nasopharyngeal Swab     Status: None   Collection Time: 09/12/19  1:50 AM   Specimen: Nasopharyngeal Swab  Result Value Ref Range Status   SARS Coronavirus 2 NEGATIVE NEGATIVE Final    Comment: (NOTE) SARS-CoV-2 target nucleic acids are NOT DETECTED. The SARS-CoV-2 RNA is generally detectable in upper and lower respiratory specimens during the acute phase of infection. The lowest concentration of SARS-CoV-2 viral copies this assay can detect is 250 copies / mL. A negative result does not preclude SARS-CoV-2 infection and should not be used as the sole basis for treatment or other patient management decisions.  A negative result may occur with improper specimen collection / handling, submission of specimen other than nasopharyngeal swab, presence of viral mutation(s) within the areas targeted by this assay, and inadequate number of viral copies (<250 copies / mL). A negative result must be combined with clinical observations, patient history, and epidemiological information. Fact Sheet for Patients:   StrictlyIdeas.no Fact Sheet for Healthcare  Providers: BankingDealers.co.za This test is not yet approved or cleared  by the [place] FDA and has been authorized for detection and/or diagnosis of SARS-CoV-2 by FDA under an Emergency Use Authorization (EUA).  This EUA will remain in effect (meaning this test can be used) for the duration of the COVID-19 declaration under Section 564(b)(1) of the Act, 21 U.S.C. section 360bbb-3(b)(1), unless the authorization is terminated or revoked sooner. Performed at [facility], [place] [address], [place],  [address]      Scheduled Meds: . allopurinol  300 mg Oral QPC breakfast  . vitamin C  500 mg Oral QPC breakfast  .  atorvastatin  20 mg Oral QPM  . dextrose  25 mL Intravenous Once  . fenofibrate  160 mg Oral QPM  . ferrous sulfate  325 mg Oral BID WC  . gabapentin  300 mg Oral BID  . heparin  5,000 Units Subcutaneous Q8H  . insulin aspart  0-9 Units Subcutaneous TID WC  . latanoprost  1 drop Both Eyes QHS  . lipase/protease/amylase  12,000 Units Oral TID WC  . loratadine  10 mg Oral Daily  . multivitamin with minerals  1 tablet Oral Q breakfast  . sertraline  100 mg Oral QPC breakfast  . vitamin B-12  2,500 mcg Oral q morning - 10a   Continuous Infusions: . ceFEPime (MAXIPIME) IV    . dextrose 5 % and 0.45% NaCl 75 mL/hr at 09/12/19 0210     LOS: 0 days   Time spent: No Charge  Cherene Altes, MD [facility] Office  [phone] Pager - Text Page per Amion as per below:  On-Call/Text Page:      [email or web address]  If 7PM-7AM, please contact night-coverage [email or web address] 09/12/2019, 9:36 AM

## 2019-09-12 NOTE — Progress Notes (Signed)
Pharmacy Antibiotic Note  SHIVAN HODES is a 86 y.o. male admitted on 09/11/2019 with UTI.  Pharmacy has been consulted for Cefepime dosing.  Plan: Cefepime 2gm iv q12hr  Height: 5\' 7"  (170.2 cm) Weight: 63.5 kg (140 lb) IBW/kg (Calculated) : 66.1  Temp (24hrs), Avg:98.7 F (37.1 C), Min:97.9 F (36.6 C), Max:99.5 F (37.5 C)  Recent Labs  Lab 09/11/19 1850 09/11/19 2020  WBC 7.4  --   CREATININE 1.18  --   LATICACIDVEN  --  0.6    Estimated Creatinine Clearance: 41.1 mL/min (by C-G formula based on SCr of 1.18 mg/dL).    Allergies  Allergen Reactions  . Atenolol Other (See Comments)    "Heart rate slowed- STOPPED on 08/16/2013"  . Chlorhexidine     Port Not accessed, contraindicated  . Niacin Other (See Comments)    headaches    Antimicrobials this admission: Cefepime 09/12/2019 >>   Dose adjustments this admission: -  Microbiology results: -  Thank you for allowing pharmacy to be a part of this patient's care.  Nani Skillern Crowford 09/12/2019 3:10 AM

## 2019-09-13 LAB — BASIC METABOLIC PANEL
Anion gap: 6 (ref 5–15)
BUN: 20 mg/dL (ref 8–23)
CO2: 21 mmol/L — ABNORMAL LOW (ref 22–32)
Calcium: 8.1 mg/dL — ABNORMAL LOW (ref 8.9–10.3)
Chloride: 109 mmol/L (ref 98–111)
Creatinine, Ser: 0.96 mg/dL (ref 0.61–1.24)
GFR calc Af Amer: >60 mL/min (ref >60)
GFR calc non Af Amer: >60 mL/min (ref >60)
Glucose, Bld: 124 mg/dL — ABNORMAL HIGH (ref 70–99)
Potassium: 3.4 mmol/L — ABNORMAL LOW (ref 3.5–5.1)
Sodium: 136 mmol/L (ref 135–145)

## 2019-09-13 LAB — TYPE AND SCREEN
ABO/RH(D): A POS
Antibody Screen: NEGATIVE
Unit division: 0

## 2019-09-13 LAB — BPAM RBC
Blood Product Expiration Date: 202106042359
ISSUE DATE / TIME: 202105200447
Unit Type and Rh: 6200

## 2019-09-13 LAB — CBC
HCT: 25.8 % — ABNORMAL LOW (ref 39.0–52.0)
Hemoglobin: 8.1 g/dL — ABNORMAL LOW (ref 13.0–17.0)
MCH: 28.2 pg (ref 26.0–34.0)
MCHC: 31.4 g/dL (ref 30.0–36.0)
MCV: 89.9 fL (ref 80.0–100.0)
Platelets: 123 10*3/uL — ABNORMAL LOW (ref 150–400)
RBC: 2.87 MIL/uL — ABNORMAL LOW (ref 4.22–5.81)
RDW: 17.6 % — ABNORMAL HIGH (ref 11.5–15.5)
WBC: 5.4 10*3/uL (ref 4.0–10.5)
nRBC: 0 % (ref 0.0–0.2)

## 2019-09-13 LAB — GLUCOSE, CAPILLARY
Glucose-Capillary: 104 mg/dL — ABNORMAL HIGH (ref 70–99)
Glucose-Capillary: 141 mg/dL — ABNORMAL HIGH (ref 70–99)
Glucose-Capillary: 219 mg/dL — ABNORMAL HIGH (ref 70–99)

## 2019-09-13 LAB — MAGNESIUM: Magnesium: 1.8 mg/dL (ref 1.7–2.4)

## 2019-09-13 MED ORDER — ENSURE ENLIVE PO LIQD: 237.0000 mL | Freq: Two times a day (BID) | ORAL | Status: DC

## 2019-09-13 MED ORDER — CIPROFLOXACIN IN D5W 400 MG/200ML IV SOLN: 400.0000 mg | Freq: Two times a day (BID) | INTRAVENOUS | Status: DC

## 2019-09-13 MED ORDER — ENSURE ENLIVE PO LIQD: 237.0000 mL | Freq: Two times a day (BID) | ORAL | Status: DC | PRN

## 2019-09-13 MED ORDER — SODIUM CHLORIDE 0.9 % IV SOLN
1.0000 g | Freq: Three times a day (TID) | INTRAVENOUS | Status: AC
  Administered 2019-09-14 – 2019-09-16 (×9): 1 g via INTRAVENOUS
  Filled 2019-09-13 (×9): qty 1

## 2019-09-13 MED ORDER — SODIUM CHLORIDE 0.9 % IV SOLN
1.0000 g | INTRAVENOUS | Status: AC
  Administered 2019-09-13: 1 g via INTRAVENOUS
  Filled 2019-09-13 (×2): qty 1

## 2019-09-13 NOTE — Progress Notes (Signed)
Pharmacy Antibiotic Note  [name] is a 86 y.o. male admitted on 09/11/2019 with sepsis secondary to UTI.  Per notes, urine culture data from [facility] indicates ESBL Klebsiella pneumoniae. Urine cultures here growing > 100K Klebsiella pneumoniae and Enterococcus faecalis (sensitivities pending). Pharmacy has been consulted for Meropenem dosing.  Plan: Meropenem 1g IV q8h Monitor renal function, cultures, clinical course  Height: 5\' 7"  (170.2 cm) Weight: 63.5 kg (140 lb) IBW/kg (Calculated) : 66.1  Temp (24hrs), Avg:98.5 F (36.9 C), Min:98.2 F (36.8 C), Max:99 F (37.2 C)  Recent Labs  Lab 09/11/19 1850 09/11/19 2020 09/12/19 0855 09/13/19 0451  WBC 7.4  --  8.5 5.4  CREATININE 1.18  --  0.93 0.96  LATICACIDVEN  --  0.6  --   --     Estimated Creatinine Clearance: 50.5 mL/min (by C-G formula based on SCr of 0.96 mg/dL).    Allergies  Allergen Reactions  . Atenolol Other (See Comments)    "Heart rate slowed- STOPPED on 08/16/2013"  . Chlorhexidine     Port Not accessed, contraindicated  . Niacin Other (See Comments)    headaches    Antimicrobials this admission: 5/19 Ceftriaxone x 1 5/20 Cefepime >> 5/21 5/21 Meropenem >>  Dose adjustments this admission: --  Microbiology results: 5/19 BCx: NGTD 5/19 UCx: > 100K Klebsiella pneumoniae, Enterococcus faecalis (sensitivities pending)  5/20 COVID: negative   Thank you for allowing pharmacy to be a part of this patient's care.   Lindell Spar, PharmD, BCPS Clinical Pharmacist  09/13/2019 7:03 PM

## 2019-09-13 NOTE — Progress Notes (Addendum)
[name] - Stepdown/ICU TEAM  [name]  IEP:[id number] DOB: 31-Aug-1931 DOA: 09/11/2019 PCP: [name], MD    Brief Narrative:  86 year old with a history of paroxysmal atrial fibrillation no longer on Xarelto, CAD status post CABG, prostate cancer, non-Hodgkin's lymphoma, DM 2, and recurrent unidentified source of GI bleeding and hematuria who was sent to the ED from the urology clinic where he was found to have low blood pressure and altered mental status.  Significant Events: 5/20 admit via [facility]  Subjective: Resting comfortably. Had a good meeting w/ Palliative Care. Remains focused on getting home asap to focus on comfort care.   Assessment & Plan:  Sepsis due to ESBL Klebisella urinary tract infection Culture data from [facility] provided by Dr. [name] and notes Kleb resistant to cefepime but sensitive to fluroquinolones - cont IV abx for extent of hospital stay, and then consider transitioning to oral regimen at home as a comfort measure  Hematuria -indwelling urinary stents Pt does not desire to undergo further urologic procedures - he has been informed that the presence of urinary hardware will make it more difficult to clear his UTI   Acute metabolic encephalopathy Mental status appears to be stabilizing somewhat   Acute on chronic blood loss anemia Baseline hemoglobin 9.5 - will recheck prior to d/c for prognostic information   Mild hypokalemia Stable   DM 2 with peripheral neuropathy and modest hypoglycemia CBG reasonably controlled - liberalize diet and minimize CBG checks   HLD  CAD status post CABG  Gout  Goals of care The patient tells me he is tired of being hospitalized and that all he really cares about his being at home and being in his garden.  Palliative Care is assisting in this process, with the goal to d/c him home as soon as home Hospice assistance can be arranged.   DVT prophylaxis: SCDs Code Status: NO CODE  BLUE Family Communication: spoke w/ daughter at bedside  Disposition Plan:  Status is: Inpatient  Remains inpatient appropriate because:Altered mental status   Dispo: The patient is from: Home              Anticipated d/c is to: Home              Anticipated d/c date is: [name] day              Patient currently is not medically stable to d/c.  Consultants:  none  Antimicrobials:  Cefepime 5/19 > 5/21 Cipro 5/21 > Ceftriaxone 5/19  Objective: Blood pressure 114/60, pulse 73, temperature 99 F (37.2 C), temperature source Oral, resp. rate 17, height 5\' 7"  ([name].702 m), weight 63.5 kg, SpO2 96 %.  Intake/Output Summary (Last 24 hours) at 09/13/2019 0949 Last data filed at 09/13/2019 0506 Gross per 24 hour  Intake 1515.2 ml  Output 1300 ml  Net 215.2 ml   Filed Weights   09/12/19 0039  Weight: 63.5 kg    Examination: General: No acute respiratory distress Lungs: Clear to auscultation bilaterally without wheezes or crackles Cardiovascular: Regular rate and rhythm without murmur gallop or rub normal S1 and S2 Abdomen: Nontender, nondistended, soft, bowel sounds positive, no rebound, no ascites, no appreciable mass Extremities: No significant cyanosis, clubbing, or edema bilateral lower extremities   CBC: Recent Labs  Lab 09/11/19 1850 09/12/19 0855 09/13/19 0451  WBC 7.4 8.5 5.4  NEUTROABS 6.8  --   --   HGB 7.6* 8.7* 8.[name]*  HCT 24.8* 27.4*  25.8*  MCV 90.5 90.[name] 89.9  PLT 127* 126* 601*   Basic Metabolic Panel: Recent Labs  Lab 09/11/19 1850 09/12/19 0855 09/13/19 0451  NA 137 135 136  K 3.8 3.4* 3.4*  CL 106 107 109  CO2 23 21* 21*  GLUCOSE 84 65* 124*  BUN 31* 24* 20  CREATININE [name].18 0.93 0.96  CALCIUM 8.8* 8.0* 8.[name]*  MG  --  [name].8 [name].8   GFR: Estimated Creatinine Clearance: 50.5 mL/min (by C-G formula based on SCr of 0.96 mg/dL).  Liver Function Tests: Recent Labs  Lab 09/11/19 1850  AST 90*  ALT 41  ALKPHOS 77  BILITOT 0.9  PROT 5.[name]*  ALBUMIN 2.4*     HbA1C: Hgb A1c MFr Bld  Date/Time Value Ref Range Status  09/12/2019 08:55 AM 5.5 4.8 - 5.6 % Final    Comment:    (NOTE) Pre diabetes:          5.7%-6.4% Diabetes:              >6.4% Glycemic control for   <7.0% adults with diabetes   04/05/2019 05:00 AM 7.4 (H) 4.8 - 5.6 % Final    Comment:    (NOTE) Pre diabetes:          5.7%-6.4% Diabetes:              >6.4% Glycemic control for   <7.0% adults with diabetes     CBG: Recent Labs  Lab 09/12/19 0951 09/12/19 1208 09/12/19 1652 09/12/19 2108 09/13/19 0733  GLUCAP 109* 112* 111* 240* 104*    Recent Results (from the past 240 hour(s))  Urine culture     Status: Abnormal (Preliminary result)   Collection Time: 09/11/19  8:20 PM   Specimen: Urine, Clean Catch  Result Value Ref Range Status   Specimen Description   Final    URINE, CLEAN CATCH Performed at [facility], [place] [address], [place], [place] [address]    Special Requests   Final    NONE Performed at [facility], [place] [address] [place], [place] [address]    Culture (A)  Final    >=100,000 COLONIES/mL GRAM NEGATIVE RODS CULTURE REINCUBATED FOR BETTER GROWTH Performed at [facility], [place] [address], [place], [place] [address]    Report Status PENDING  Incomplete  Culture, blood (routine x 2)     Status: None (Preliminary result)   Collection Time: 09/11/19  8:20 PM   Specimen: BLOOD  Result Value Ref Range Status   Specimen Description   Final    BLOOD PORTA CATH Performed at [place] [address], [place], [place] [address]    Special Requests   Final    BOTTLES DRAWN AEROBIC AND ANAEROBIC Blood Culture adequate volume Performed at [place] [address] [place], [place] [address]    Culture   Final    NO GROWTH 2 DAYS Performed at [place] [address] [place], [place] [address]    Report Status PENDING  Incomplete  Culture,  blood (routine x 2)     Status: None (Preliminary result)   Collection Time: 09/11/19  8:20 PM   Specimen: BLOOD  Result Value Ref Range Status   Specimen Description   Final    BLOOD LEFT ANTECUBITAL Performed at [place] [address] [place], [place] [address]    Special Requests   Final    BOTTLES DRAWN  AEROBIC AND ANAEROBIC Blood Culture adequate volume Performed at [place] [address], [place], [place] [address]    Culture   Final    NO GROWTH 2 DAYS Performed at [place] [address] [place], [place] [address]    Report Status PENDING  Incomplete  SARS Coronavirus 2 by RT PCR (hospital order, performed in [facility] hospital lab) Nasopharyngeal Nasopharyngeal Swab     Status: None   Collection Time: 09/12/19  [name]:50 AM   Specimen: Nasopharyngeal Swab  Result Value Ref Range Status   SARS Coronavirus 2 NEGATIVE NEGATIVE Final    Comment: (NOTE) SARS-CoV-2 target nucleic acids are NOT DETECTED. The SARS-CoV-2 RNA is generally detectable in upper and lower respiratory specimens during the acute phase of infection. The lowest concentration of SARS-CoV-2 viral copies this assay can detect is 250 copies / mL. A negative result does not preclude SARS-CoV-2 infection and should not be used as the sole basis for treatment or other patient management decisions.  A negative result may occur with improper specimen collection / handling, submission of specimen other than nasopharyngeal swab, presence of viral mutation(s) within the areas targeted by this assay, and inadequate number of viral copies (<250 copies / mL). A negative result must be combined with clinical observations, patient history, and epidemiological information. Fact Sheet for Patients:   StrictlyIdeas.no Fact Sheet for Healthcare Providers: BankingDealers.co.za This test is not yet approved or cleared  by  the [place] FDA and has been authorized for detection and/or diagnosis of SARS-CoV-2 by FDA under an Emergency Use Authorization (EUA).  This EUA will remain in effect (meaning this test can be used) for the duration of the COVID-19 declaration under Section 564(b)([name]) of the Act, 21 U.S.C. section 360bbb-3(b)([name]), unless the authorization is terminated or revoked sooner. Performed at [facility], [place] [address] [place], [place] [address]      Scheduled Meds: . allopurinol  300 mg Oral QPC breakfast  . vitamin C  500 mg Oral QPC breakfast  . dextrose  25 mL Intravenous Once  . fenofibrate  160 mg Oral QPM  . ferrous sulfate  325 mg Oral BID WC  . gabapentin  300 mg Oral BID  . heparin  5,000 Units Subcutaneous Q8H  . insulin aspart  0-9 Units Subcutaneous TID WC  . latanoprost  [name] drop Both Eyes QHS  . lipase/protease/amylase  12,000 Units Oral TID WC  . loratadine  10 mg Oral Daily  . multivitamin with minerals  [name] tablet Oral Q breakfast  . sertraline  100 mg Oral QPC breakfast  . vitamin B-12  2,500 mcg Oral q morning - 10a   Continuous Infusions: . ceFEPime (MAXIPIME) IV 2 g (09/13/19 0518)  . dextrose 5 % and 0.45% NaCl 75 mL/hr at 09/12/19 2119     LOS: [name] day    Cherene Altes, MD [facility] Office  [phone] Pager - Text Page per Amion as per below:  On-Call/Text Page:      [email or web address]  If 7PM-7AM, please contact night-coverage [email or web address] 09/13/2019, 9:49 AM

## 2019-09-13 NOTE — Progress Notes (Signed)
Initial Nutrition Assessment  INTERVENTION:   -Ensure Enlive po BID, each supplement provides 350 kcal and 20 grams of protein  NUTRITION DIAGNOSIS:   Increased nutrient needs related to acute illness as evidenced by estimated needs.  GOAL:   Patient will meet greater than or equal to 90% of their needs  MONITOR:   PO intake, Supplement acceptance, Weight trends, Labs, I & O's  REASON FOR ASSESSMENT:   Malnutrition Screening Tool    ASSESSMENT:   86 year old with a history of paroxysmal atrial fibrillation no longer on Xarelto, CAD status post CABG, prostate cancer, non-Hodgkin's lymphoma, DM 2, and recurrent unidentified source of GI bleeding and hematuria who was sent to the ED from the urology clinic where he was found to have low blood pressure and altered mental status.  **RD working remotely**  Patient with history of NHL, still receives maintenance treatment. Patient is consuming 100% of meals today. Has had poor appetite PTA. Will order Ensure supplements for additional kcals and protein. Per Palliative care note, pt to discharge home with hospice.  Per weight records, pt has lost 22 lbs since 03/15/19 (13% wt loss x 6 months, significant for time frame).  I/Os: +1.4L since admit UOP: 700 ml x 24 hrs  Medications: Vitamin C, Ferrous sulfate, Creon, Multivitamin with minerals daily, Vitamin B-12 Labs reviewed: CBGs: 104-141 Low K   NUTRITION - FOCUSED PHYSICAL EXAM:  RD working remotely.  Diet Order:   Diet Order            Diet regular Room service appropriate? Yes; Fluid consistency: Thin  Diet effective now              EDUCATION NEEDS:   No education needs have been identified at this time  Skin:  Skin Assessment: Reviewed RN Assessment  Last BM:  5/20 -type 5  Height:   Ht Readings from Last 1 Encounters:  09/12/19 5\' 7"  (1.702 m)    Weight:   Wt Readings from Last 1 Encounters:  09/12/19 63.5 kg    Ideal Body Weight:     BMI:   Body mass index is 21.93 kg/m.  Estimated Nutritional Needs:   Kcal:  1600-1800  Protein:  75-90g  Fluid:  1.8L/day  Michael Bibles, MS, RD, LDN Inpatient Clinical Dietitian Contact information available via Amion

## 2019-09-13 NOTE — Consult Note (Signed)
Palliative care consult note  Reason for consult: Goals of care in light of prostate cancer and lymphoma  Palliative care consult received.  Chart reviewed including personal review of pertinent labs and imaging.  Briefly, [name] is a very pleasant 86 year old gentleman with past medical history of paroxysmal A. fib (no longer on Xarelto), CAD status post CABG, prostate cancer, non-Hodgkin's lymphoma, diabetes type 2, recurrent GI bleeding (unclear source) with hematuria and bilateral urinary stent placement.  He was admitted following presentation to urology clinic where he was found to have low blood pressure, altered mental status, and low hemoglobin.  He is status post transfusion and currently on IV antibiotics.  He has noted that he does not really want further trips to the hospital and just wants to be home in his garden.  Palliative consulted for goals of care with consideration for hospice.  I met today with [name], his daughter [name], and his daughter [name] (joined [place] via phone).  He reports that the doctors have been doing a good job explaining things to him and he understands his condition well.  He tells me that the most important things to him are his family, his dog, his faith, and being at his home.  He particularly likes to be out in his garden.  He previously worked on [employer].  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  [name] reports that he is tired of going back and forth to the hospital and he just wants to be comfortable in his own home.  He lives with his youngest daughter and his other 2 daughters also help to care for him.  They also have hired sitters that stay with him while family is working.  We discussed his clinical course over the past several months.  Family relayed that he has been telling them he is tired of  back-and-forth trips to the hospital and he just wants to be at home and stay at home. Concepts specific hospice and rehospitalization discussed.  We discussed difference between a aggressive medical intervention path and a palliative, comfort focused care path.    Both [name] and his family are clear that their desire moving forward is to work to transition home with the support of hospice.  His daughter [name] is familiar with [facility] from prior experience with her mother-in-law and they state they would like to work with them moving forward for the care of [name].  He reports he has all the equipment he needs and would like to work to get him home as soon as possible.  -DNR/DNI -[name] would like to work to get home with the support of hospice on discharge.  Will place referral to [facility] for home hospice services.  Family is familiar with [facility] from prior experience with other family members and requested to work with them when he discharges home. -I let family know that I would coordinate with Dr. Thereasa Solo regarding treatment of his current UTI.  Discussed that I would continue with treatment for this infection and then reassess his situation once he transition home is is established with hospice services.  Start time: 1330 End time: 1430 Total time: 60 minutes  Greater than 50%  of this time was spent counseling and coordinating care related to the above assessment and plan.  Micheline Rough, MD [place]  Palliative Medicine Team [phone]
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
## 2019-09-13 NOTE — Plan of Care (Signed)
  Problem: Health Behavior/Discharge Planning: Goal: Ability to manage health-related needs will improve Outcome: Progressing   Problem: Clinical Measurements: Goal: Ability to maintain clinical measurements within normal limits will improve Outcome: Progressing Goal: Will remain free from infection Outcome: Progressing Goal: Cardiovascular complication will be avoided Outcome: Progressing   

## 2019-09-14 ENCOUNTER — Encounter (HOSPITAL_COMMUNITY): Payer: Self-pay | Admitting: Internal Medicine

## 2019-09-14 LAB — GLUCOSE, CAPILLARY: Glucose-Capillary: 87 mg/dL (ref 70–99)

## 2019-09-14 MED ORDER — SODIUM CHLORIDE 0.9 % IV SOLN: INTRAVENOUS | Status: DC
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
## 2019-09-14 NOTE — Progress Notes (Addendum)
[name]  OIN:[id number] DOB: 10-Nov-1931 DOA: 09/11/2019 PCP: [name], MD    Brief Narrative:  86 year old with a history of paroxysmal atrial fibrillation no longer on Xarelto, CAD status post CABG, prostate cancer, non-Hodgkin's lymphoma, DM 2, and recurrent unidentified source of GI bleeding and hematuria who was sent to the ED from the urology clinic where he was found to have low blood pressure and altered mental status.  Significant Events: 5/20 admit via [facility]  Subjective: Resting comfortably in bed.  No new complaints today.  Assessment & Plan:  Sepsis due to ESBL Klebisella + Enterococcus urinary tract infection Culture data from [facility] provided by Dr. [name] and notes Kleb resistant to cefepime but sensitive to fluroquinolones - cont IV abx for extent of hospital stay, and then consider transitioning to oral regimen at home as a comfort measure  Hematuria -indwelling urinary stents To have stents replaced in the OR Monday -urology following  Acute metabolic encephalopathy Mental status improved and stable  Acute on chronic blood loss anemia Baseline hemoglobin 9.5 - will recheck prior to the OR Monday  Mild hypokalemia Stable   DM 2 with peripheral neuropathy and modest hypoglycemia CBG reasonably controlled - liberalize diet and minimize CBG checks   HLD  CAD status post CABG  Gout  Goals of care The patient tells me he is tired of being hospitalized and that all he really cares about his being at home and being in his garden.  Palliative Care is assisting in this process, with the goal to d/c him home as soon as home Hospice assistance can be arranged.   DVT prophylaxis: SCDs Code Status: NO CODE BLUE Family Communication: spoke w/ daughter at bedside  Disposition Plan:  Status is: Inpatient  Remains inpatient appropriate because:Altered mental status   Dispo: The patient is from: Home              Anticipated d/c is  to: Home              Anticipated d/c date is: Mon/Tues              Patient currently is not medically stable to d/c.  Consultants:  none  Antimicrobials:  Cefepime 5/19 > 5/21 Ceftriaxone 5/19 Meropenem 5/21 >  Objective: Blood pressure 112/72, pulse 68, temperature 98.6 F (37 C), temperature source Oral, resp. rate 18, height 5\' 7"  (1.702 m), weight 63.5 kg, SpO2 97 %.  Intake/Output Summary (Last 24 hours) at 09/14/2019 0835 Last data filed at 09/13/2019 1700 Gross per 24 hour  Intake 720 ml  Output 500 ml  Net 220 ml   Filed Weights   09/12/19 0039  Weight: 63.5 kg    Examination: General: No acute respiratory distress Lungs: CTA B - no wheezing  Cardiovascular: RRR Abdomen: Nontender, nondistended, soft Extremities: No signif edema bilateral lower extremities   CBC: Recent Labs  Lab 09/11/19 1850 09/12/19 0855 09/13/19 0451  WBC 7.4 8.5 5.4  NEUTROABS 6.8  --   --   HGB 7.6* 8.7* 8.1*  HCT 24.8* 27.4* 25.8*  MCV 90.5 90.1 89.9  PLT 127* 126* 709*   Basic Metabolic Panel: Recent Labs  Lab 09/11/19 1850 09/12/19 0855 09/13/19 0451  NA 137 135 136  K 3.8 3.4* 3.4*  CL 106 107 109  CO2 23 21* 21*  GLUCOSE 84 65* 124*  BUN 31* 24* 20  CREATININE 1.18 0.93 0.96  CALCIUM 8.8* 8.0* 8.1*  MG  --  1.8 1.8   GFR: Estimated Creatinine Clearance: 50.5 mL/min (by C-G formula based on SCr of 0.96 mg/dL).  Liver Function Tests: Recent Labs  Lab 09/11/19 1850  AST 90*  ALT 41  ALKPHOS 77  BILITOT 0.9  PROT 5.1*  ALBUMIN 2.4*    HbA1C: Hgb A1c MFr Bld  Date/Time Value Ref Range Status  09/12/2019 08:55 AM 5.5 4.8 - 5.6 % Final    Comment:    (NOTE) Pre diabetes:          5.7%-6.4% Diabetes:              >6.4% Glycemic control for   <7.0% adults with diabetes   04/05/2019 05:00 AM 7.4 (H) 4.8 - 5.6 % Final    Comment:    (NOTE) Pre diabetes:          5.7%-6.4% Diabetes:              >6.4% Glycemic control for   <7.0% adults with  diabetes     CBG: Recent Labs  Lab 09/12/19 2108 09/13/19 0733 09/13/19 1135 09/13/19 1638 09/14/19 0816  GLUCAP 240* 104* 141* 219* 87    Recent Results (from the past 240 hour(s))  Urine culture     Status: Abnormal (Preliminary result)   Collection Time: 09/11/19  8:20 PM   Specimen: Urine, Clean Catch  Result Value Ref Range Status   Specimen Description   Final    URINE, CLEAN CATCH Performed at [facility], [place] [address] [place], [place] [address]    Special Requests   Final    NONE Performed at [facility], [place] [address] [place], [place] [address]    Culture (A)  Final    >=100,000 COLONIES/mL KLEBSIELLA PNEUMONIAE >=100,000 COLONIES/mL ENTEROCOCCUS FAECALIS THE KLEBSIELLA PNEUMONIAE IS A Confirmed Extended Spectrum Beta-Lactamase Producer (ESBL).  In bloodstream infections from ESBL organisms, carbapenems are preferred over piperacillin/tazobactam. They are shown to have a lower risk of mortality.    Report Status PENDING  Incomplete   Organism ID, Bacteria KLEBSIELLA PNEUMONIAE (A)  Final      Susceptibility   Klebsiella pneumoniae - MIC*    AMPICILLIN >=32 RESISTANT Resistant     CEFAZOLIN >=64 RESISTANT Resistant     CEFTRIAXONE >=64 RESISTANT Resistant     CIPROFLOXACIN 1 SENSITIVE Sensitive     GENTAMICIN <=1 SENSITIVE Sensitive     IMIPENEM <=0.25 SENSITIVE Sensitive     NITROFURANTOIN 64 INTERMEDIATE Intermediate     TRIMETH/SULFA >=320 RESISTANT Resistant     AMPICILLIN/SULBACTAM >=32 RESISTANT Resistant     PIP/TAZO <=4 SENSITIVE Sensitive     * >=100,000 COLONIES/mL KLEBSIELLA PNEUMONIAE  Culture, blood (routine x 2)     Status: None (Preliminary result)   Collection Time: 09/11/19  8:20 PM   Specimen: BLOOD  Result Value Ref Range Status   Specimen Description   Final    BLOOD PORTA CATH Performed at [place] [address], [place], [place] [address]    Special Requests    Final    BOTTLES DRAWN AEROBIC AND ANAEROBIC Blood Culture adequate volume Performed at [place] [address] [place], [place] [address]    Culture   Final    NO GROWTH 3 DAYS Performed at [facility], [place] [address], [place], [place] [address]    Report Status PENDING  Incomplete  Culture, blood (routine x 2)     Status: None (Preliminary result)  Collection Time: 09/11/19  8:20 PM   Specimen: BLOOD  Result Value Ref Range Status   Specimen Description   Final    BLOOD LEFT ANTECUBITAL Performed at [place] [address] [place], [place] [address]    Special Requests   Final    BOTTLES DRAWN AEROBIC AND ANAEROBIC Blood Culture adequate volume Performed at [place] [address], [place], [place] [address]    Culture   Final    NO GROWTH 3 DAYS Performed at [facility], [name] [address] [place], [place] [address]    Report Status PENDING  Incomplete  SARS Coronavirus 2 by RT PCR (hospital order, performed in [facility] hospital lab) Nasopharyngeal Nasopharyngeal Swab     Status: None   Collection Time: 09/12/19  1:50 AM   Specimen: Nasopharyngeal Swab  Result Value Ref Range Status   SARS Coronavirus 2 NEGATIVE NEGATIVE Final    Comment: (NOTE) SARS-CoV-2 target nucleic acids are NOT DETECTED. The SARS-CoV-2 RNA is generally detectable in upper and lower respiratory specimens during the acute phase of infection. The lowest concentration of SARS-CoV-2 viral copies this assay can detect is 250 copies / mL. A negative result does not preclude SARS-CoV-2 infection and should not be used as the sole basis for treatment or other patient management decisions.  A negative result may occur with improper specimen collection / handling, submission of specimen other than nasopharyngeal swab, presence of viral mutation(s) within the areas targeted by this assay, and inadequate number of viral  copies (<250 copies / mL). A negative result must be combined with clinical observations, patient history, and epidemiological information. Fact Sheet for Patients:   StrictlyIdeas.no Fact Sheet for Healthcare Providers: BankingDealers.co.za This test is not yet approved or cleared  by the [place] FDA and has been authorized for detection and/or diagnosis of SARS-CoV-2 by FDA under an Emergency Use Authorization (EUA).  This EUA will remain in effect (meaning this test can be used) for the duration of the COVID-19 declaration under Section 564(b)(1) of the Act, 21 U.S.C. section 360bbb-3(b)(1), unless the authorization is terminated or revoked sooner. Performed at [facility], [place] [address], [place], [place] [address]      Scheduled Meds: . allopurinol  300 mg Oral QPC breakfast  . ferrous sulfate  325 mg Oral BID WC  . gabapentin  300 mg Oral BID  . heparin  5,000 Units Subcutaneous Q8H  . latanoprost  1 drop Both Eyes QHS  . lipase/protease/amylase  12,000 Units Oral TID WC  . loratadine  10 mg Oral Daily  . sertraline  100 mg Oral QPC breakfast  . vitamin B-12  2,500 mcg Oral q morning - 10a   Continuous Infusions: . dextrose 5 % and 0.45% NaCl 10 mL/hr at 09/13/19 1704  . meropenem (MERREM) IV 1 g (09/14/19 0404)     LOS: 2 days    Cherene Altes, MD [facility] Office  [phone] Pager - Text Page per Amion as per below:  On-Call/Text Page:      [email or web address]  If 7PM-7AM, please contact night-coverage [email or web address] 09/14/2019, 8:35 AM

## 2019-09-14 NOTE — Progress Notes (Addendum)
[facility] Documentation  [facility] colleague received late referral for pt to return home with hospice services.   Liaison called pt's daughter, [name], with no answer. Left VM to return call.   1354: RN spoke with pt's daughter, [name]. Explained hospice services and philosophy and answered all questions. Daughter agrees to hospice services and stated that pt does need a bedside table at home. Denies any further DME needs.    Pt's chart in review by hospice MD to determine eligibility. Will update TOC and family once pt is approved.    Please do not hesitate to outreach with any questions and thank you for the referral.     Thank you,  Freddie Breech, RN  [facility] Liaison  [phone]

## 2019-09-15 LAB — URINE CULTURE: Culture: >=100000 — AB

## 2019-09-15 NOTE — Progress Notes (Signed)
[name]  ZLD:[id number] DOB: [birth year] DOA: 09/11/2019 PCP: [name], MD    Brief Narrative:  86 year old with a history of paroxysmal atrial fibrillation no longer on Xarelto, CAD status post CABG, prostate cancer, non-Hodgkin's lymphoma, DM 2, and recurrent unidentified source of GI bleeding and hematuria who was sent to the ED from the urology clinic where he was found to have low blood pressure and altered mental status.  Significant Events: 5/20 admit via [facility]  Subjective: Resting comfortably.  Great spirits.  Alert oriented and conversant.  Has no complaints.  Agrees with plan to replace his stents tomorrow and then pursue discharge home with hospice support as soon as it can be arranged.  Assessment & Plan:  Sepsis due to ESBL Klebisella + Enterococcus urinary tract infection Culture data from [facility] provided by Dr. [name] and notes Kleb resistant to cefepime but sensitive to fluroquinolones -culture from this hospitalization with same organism plus Enterococcus - cont IV abx for extent of hospital stay, and then transition to oral regimen at home as a comfort measure  Hematuria - indwelling urinary stents To have stents replaced in the OR Monday - Urology following  Acute metabolic encephalopathy Mental status has now returned to baseline with treatment of his UTI  Acute on chronic blood loss anemia Baseline hemoglobin 9.5 - will recheck prior to the OR Monday  Mild hypokalemia Stable   DM 2 with peripheral neuropathy and modest hypoglycemia CBG reasonably controlled - liberalized diet - minimize CBG checks   HLD  CAD status post CABG  Gout  Goals of care The patient tells me he is tired of being hospitalized and that all he really cares about his being at home and being in his garden.  Palliative Care is assisting in this process, with the goal to d/c him home as soon as home Hospice assistance can be arranged.   DVT  prophylaxis: SCDs Code Status: NO CODE BLUE Family Communication: No family present at time of exam today Disposition Plan:  Status is: Inpatient  Remains inpatient appropriate because: need for IV medication/abx - need for surgical procedure   Dispo: The patient is from: Home              Anticipated d/c is to: Home              Anticipated d/c date is: Mon/Tues              Patient currently is not medically stable to d/c.  Consultants:  none  Antimicrobials:  Cefepime 5/19 > 5/21 Ceftriaxone 5/19 Meropenem 5/21 >  Objective: Blood pressure 122/66, pulse 68, temperature 97.8 F (36.6 C), temperature source Oral, resp. rate 19, height 5\' 7"  (1.702 m), weight 63.5 kg, SpO2 97 %.  Intake/Output Summary (Last 24 hours) at 09/15/2019 0814 Last data filed at 09/15/2019 0400 Gross per 24 hour  Intake 1497.11 ml  Output 825 ml  Net 672.11 ml   Filed Weights   09/12/19 0039  Weight: 63.5 kg    Examination: General: No acute respiratory distress Lungs: CTA B - no wheezing  Cardiovascular: RRR Abdomen: Nontender, nondistended, soft, bs+ Extremities: No signif edema B LE    CBC: Recent Labs  Lab 09/11/19 1850 09/12/19 0855 09/13/19 0451  WBC 7.4 8.5 5.4  NEUTROABS 6.8  --   --   HGB 7.6* 8.7* 8.1*  HCT 24.8* 27.4* 25.8*  MCV 90.5 90.1 89.9  PLT 127* 126* 123*  Basic Metabolic Panel: Recent Labs  Lab 09/11/19 1850 09/12/19 0855 09/13/19 0451  NA 137 135 136  K 3.8 3.4* 3.4*  CL 106 107 109  CO2 23 21* 21*  GLUCOSE 84 65* 124*  BUN 31* 24* 20  CREATININE 1.18 0.93 0.96  CALCIUM 8.8* 8.0* 8.1*  MG  --  1.8 1.8   GFR: Estimated Creatinine Clearance: 50.5 mL/min (by C-G formula based on SCr of 0.96 mg/dL).  Liver Function Tests: Recent Labs  Lab 09/11/19 1850  AST 90*  ALT 41  ALKPHOS 77  BILITOT 0.9  PROT 5.1*  ALBUMIN 2.4*    HbA1C: Hgb A1c MFr Bld  Date/Time Value Ref Range Status  09/12/2019 08:55 AM 5.5 4.8 - 5.6 % Final    Comment:     (NOTE) Pre diabetes:          5.7%-6.4% Diabetes:              >6.4% Glycemic control for   <7.0% adults with diabetes   04/05/2019 05:00 AM 7.4 (H) 4.8 - 5.6 % Final    Comment:    (NOTE) Pre diabetes:          5.7%-6.4% Diabetes:              >6.4% Glycemic control for   <7.0% adults with diabetes     CBG: Recent Labs  Lab 09/12/19 2108 09/13/19 0733 09/13/19 1135 09/13/19 1638 09/14/19 0816  GLUCAP 240* 104* 141* 219* 87    Recent Results (from the past 240 hour(s))  Urine culture     Status: Abnormal (Preliminary result)   Collection Time: 09/11/19  8:20 PM   Specimen: Urine, Clean Catch  Result Value Ref Range Status   Specimen Description   Final    URINE, CLEAN CATCH Performed at [facility], [place] [address], [place], [place] [address]    Special Requests   Final    NONE Performed at [facility], [place] [address], [place], [place] [address]    Culture (A)  Final    >=100,000 COLONIES/mL KLEBSIELLA PNEUMONIAE >=100,000 COLONIES/mL ENTEROCOCCUS FAECALIS THE KLEBSIELLA PNEUMONIAE IS A Confirmed Extended Spectrum Beta-Lactamase Producer (ESBL).  In bloodstream infections from ESBL organisms, carbapenems are preferred over piperacillin/tazobactam. They are shown to have a lower risk of mortality.    Report Status PENDING  Incomplete   Organism ID, Bacteria KLEBSIELLA PNEUMONIAE (A)  Final      Susceptibility   Klebsiella pneumoniae - MIC*    AMPICILLIN >=32 RESISTANT Resistant     CEFAZOLIN >=64 RESISTANT Resistant     CEFTRIAXONE >=64 RESISTANT Resistant     CIPROFLOXACIN 1 SENSITIVE Sensitive     GENTAMICIN <=1 SENSITIVE Sensitive     IMIPENEM <=0.25 SENSITIVE Sensitive     NITROFURANTOIN 64 INTERMEDIATE Intermediate     TRIMETH/SULFA >=320 RESISTANT Resistant     AMPICILLIN/SULBACTAM >=32 RESISTANT Resistant     PIP/TAZO <=4 SENSITIVE Sensitive     * >=100,000 COLONIES/mL KLEBSIELLA PNEUMONIAE  Culture, blood  (routine x 2)     Status: None (Preliminary result)   Collection Time: 09/11/19  8:20 PM   Specimen: BLOOD  Result Value Ref Range Status   Specimen Description   Final    BLOOD PORTA CATH Performed at [place] [address] [place], [place] [address]    Special Requests   Final    BOTTLES DRAWN AEROBIC AND ANAEROBIC Blood Culture adequate volume Performed at [facility]  [facility], [place] [address] [place], [place] [address]    Culture   Final    NO GROWTH 3 DAYS Performed at [facility], [place] [address] [place], [place] [address]    Report Status PENDING  Incomplete  Culture, blood (routine x 2)     Status: None (Preliminary result)   Collection Time: 09/11/19  8:20 PM   Specimen: BLOOD  Result Value Ref Range Status   Specimen Description   Final    BLOOD LEFT ANTECUBITAL Performed at [place] [address], [place], [place] [address]    Special Requests   Final    BOTTLES DRAWN AEROBIC AND ANAEROBIC Blood Culture adequate volume Performed at [place] [address], [place], [place] [address]    Culture   Final    NO GROWTH 3 DAYS Performed at [facility], [place] [address] [place], [place] [address]    Report Status PENDING  Incomplete  SARS Coronavirus 2 by RT PCR (hospital order, performed in [facility] hospital lab) Nasopharyngeal Nasopharyngeal Swab     Status: None   Collection Time: 09/12/19  1:50 AM   Specimen: Nasopharyngeal Swab  Result Value Ref Range Status   SARS Coronavirus 2 NEGATIVE NEGATIVE Final    Comment: (NOTE) SARS-CoV-2 target nucleic acids are NOT DETECTED. The SARS-CoV-2 RNA is generally detectable in upper and lower respiratory specimens during the acute phase of infection. The lowest concentration of SARS-CoV-2 viral copies this assay can detect is 250 copies / mL. A negative result does not preclude SARS-CoV-2 infection and should not be used as  the sole basis for treatment or other patient management decisions.  A negative result may occur with improper specimen collection / handling, submission of specimen other than nasopharyngeal swab, presence of viral mutation(s) within the areas targeted by this assay, and inadequate number of viral copies (<250 copies / mL). A negative result must be combined with clinical observations, patient history, and epidemiological information. Fact Sheet for Patients:   StrictlyIdeas.no Fact Sheet for Healthcare Providers: BankingDealers.co.za This test is not yet approved or cleared  by the [place] FDA and has been authorized for detection and/or diagnosis of SARS-CoV-2 by FDA under an Emergency Use Authorization (EUA).  This EUA will remain in effect (meaning this test can be used) for the duration of the COVID-19 declaration under Section 564(b)(1) of the Act, 21 U.S.C. section 360bbb-3(b)(1), unless the authorization is terminated or revoked sooner. Performed at [facility], [place] [address] [place], [place] [address]      Scheduled Meds: . allopurinol  300 mg Oral QPC breakfast  . ferrous sulfate  325 mg Oral BID WC  . gabapentin  300 mg Oral BID  . heparin  5,000 Units Subcutaneous Q8H  . latanoprost  1 drop Both Eyes QHS  . lipase/protease/amylase  12,000 Units Oral TID WC  . loratadine  10 mg Oral Daily  . sertraline  100 mg Oral QPC breakfast  . vitamin B-12  2,500 mcg Oral q morning - 10a   Continuous Infusions: . sodium chloride 10 mL/hr at 09/15/19 0400  . meropenem (MERREM) IV 1 g (09/15/19 0500)     LOS: 3 days    Cherene Altes, MD [facility] Office  [phone] Pager - Text Page per Amion as per below:  On-Call/Text Page:      [email or web address]  If 7PM-7AM, please contact night-coverage [email or web address] 09/15/2019, 8:14 AM

## 2019-09-15 NOTE — Progress Notes (Signed)
[facility] Documentation  Pt has been approved for hospice services at home. Liaison to continue to follow during hospital stay to ensure prompt admission visit to hospice upon discharge.   Please do not hesitate to outreach with any questions and thank you for the referral.   Freddie Breech, RN  [facility] Liaison [phone]

## 2019-09-16 ENCOUNTER — Encounter (HOSPITAL_COMMUNITY)
Admission: EM | Disposition: A | Discharge status: Hospice | Payer: Self-pay | Source: Home / Self Care | Attending: Internal Medicine

## 2019-09-16 ENCOUNTER — Inpatient Hospital Stay (HOSPITAL_COMMUNITY): Payer: Medicare Other

## 2019-09-16 ENCOUNTER — Inpatient Hospital Stay (HOSPITAL_COMMUNITY): Payer: Medicare Other | Admitting: Anesthesiology

## 2019-09-16 ENCOUNTER — Encounter (HOSPITAL_COMMUNITY): Payer: Self-pay | Admitting: Internal Medicine

## 2019-09-16 ENCOUNTER — Ambulatory Visit (HOSPITAL_COMMUNITY): Admission: RE | Admit: 2019-09-16 | Payer: Medicare Other | Source: Home / Self Care | Admitting: Urology

## 2019-09-16 DIAGNOSIS — Z7189 Other specified counseling: Secondary | ICD-10-CM

## 2019-09-16 DIAGNOSIS — Z515 Encounter for palliative care: Secondary | ICD-10-CM

## 2019-09-16 DIAGNOSIS — C8228 Follicular lymphoma grade III, unspecified, lymph nodes of multiple sites: Secondary | ICD-10-CM

## 2019-09-16 HISTORY — PX: CYSTOSCOPY WITH STENT PLACEMENT: SHX5790

## 2019-09-16 LAB — CULTURE, BLOOD (ROUTINE X 2)
Culture: NO GROWTH
Culture: NO GROWTH
Special Requests: ADEQUATE
Special Requests: ADEQUATE

## 2019-09-16 LAB — BASIC METABOLIC PANEL
Anion gap: 8 (ref 5–15)
BUN: 20 mg/dL (ref 8–23)
CO2: 22 mmol/L (ref 22–32)
Calcium: 8.2 mg/dL — ABNORMAL LOW (ref 8.9–10.3)
Chloride: 109 mmol/L (ref 98–111)
Creatinine, Ser: 0.8 mg/dL (ref 0.61–1.24)
GFR calc Af Amer: >60 mL/min (ref >60)
GFR calc non Af Amer: >60 mL/min (ref >60)
Glucose, Bld: 130 mg/dL — ABNORMAL HIGH (ref 70–99)
Potassium: 3.8 mmol/L (ref 3.5–5.1)
Sodium: 139 mmol/L (ref 135–145)

## 2019-09-16 LAB — CBC
HCT: 28 % — ABNORMAL LOW (ref 39.0–52.0)
Hemoglobin: 8.6 g/dL — ABNORMAL LOW (ref 13.0–17.0)
MCH: 28.2 pg (ref 26.0–34.0)
MCHC: 30.7 g/dL (ref 30.0–36.0)
MCV: 91.8 fL (ref 80.0–100.0)
Platelets: 134 10*3/uL — ABNORMAL LOW (ref 150–400)
RBC: 3.05 MIL/uL — ABNORMAL LOW (ref 4.22–5.81)
RDW: 17.7 % — ABNORMAL HIGH (ref 11.5–15.5)
WBC: 5.7 10*3/uL (ref 4.0–10.5)
nRBC: 0 % (ref 0.0–0.2)

## 2019-09-16 LAB — GLUCOSE, CAPILLARY: Glucose-Capillary: 104 mg/dL — ABNORMAL HIGH (ref 70–99)

## 2019-09-16 LAB — SURGICAL PCR SCREEN
MRSA, PCR: NEGATIVE
Staphylococcus aureus: NEGATIVE

## 2019-09-16 SURGERY — CYSTOSCOPY, WITH STENT INSERTION
Anesthesia: General | Laterality: Bilateral

## 2019-09-16 MED ORDER — FENTANYL CITRATE (PF) 100 MCG/2ML IJ SOLN: 25.0000 ug | INTRAMUSCULAR | Status: DC | PRN

## 2019-09-16 MED ORDER — PROPOFOL 10 MG/ML IV BOLUS
INTRAVENOUS | Status: DC | PRN
  Administered 2019-09-16: 80 mg via INTRAVENOUS

## 2019-09-16 MED ORDER — ONDANSETRON HCL 4 MG/2ML IJ SOLN
INTRAMUSCULAR | Status: DC | PRN
  Administered 2019-09-16: 4 mg via INTRAVENOUS

## 2019-09-16 MED ORDER — LACTATED RINGERS IV SOLN: INTRAVENOUS | Status: DC | PRN

## 2019-09-16 MED ORDER — PROPOFOL 10 MG/ML IV BOLUS
INTRAVENOUS | Status: AC
  Filled 2019-09-16: qty 20

## 2019-09-16 MED ORDER — ONDANSETRON HCL 4 MG/2ML IJ SOLN
INTRAMUSCULAR | Status: AC
  Filled 2019-09-16: qty 2

## 2019-09-16 MED ORDER — LIDOCAINE HCL (CARDIAC) PF 100 MG/5ML IV SOSY
PREFILLED_SYRINGE | INTRAVENOUS | Status: DC | PRN
  Administered 2019-09-16: 30 mg via INTRAVENOUS

## 2019-09-16 MED ORDER — AMOXICILLIN 500 MG PO CAPS
500.0000 mg | ORAL_CAPSULE | Freq: Three times a day (TID) | ORAL | Status: DC
  Administered 2019-09-17: 500 mg via ORAL
  Filled 2019-09-16 (×2): qty 1

## 2019-09-16 MED ORDER — FENTANYL CITRATE (PF) 100 MCG/2ML IJ SOLN
INTRAMUSCULAR | Status: DC | PRN
  Administered 2019-09-16: 25 ug via INTRAVENOUS

## 2019-09-16 MED ORDER — EPHEDRINE 5 MG/ML INJ
INTRAVENOUS | Status: AC
  Filled 2019-09-16: qty 10

## 2019-09-16 MED ORDER — EPHEDRINE SULFATE 50 MG/ML IJ SOLN
INTRAMUSCULAR | Status: DC | PRN
  Administered 2019-09-16 (×3): 10 mg via INTRAVENOUS

## 2019-09-16 MED ORDER — CIPROFLOXACIN HCL 500 MG PO TABS
500.0000 mg | ORAL_TABLET | Freq: Two times a day (BID) | ORAL | Status: DC
  Administered 2019-09-17: 500 mg via ORAL
  Filled 2019-09-16: qty 1

## 2019-09-16 MED ORDER — DEXAMETHASONE SODIUM PHOSPHATE 10 MG/ML IJ SOLN
INTRAMUSCULAR | Status: AC
  Filled 2019-09-16: qty 1

## 2019-09-16 MED ORDER — FENTANYL CITRATE (PF) 100 MCG/2ML IJ SOLN
INTRAMUSCULAR | Status: AC
  Filled 2019-09-16: qty 2

## 2019-09-16 MED ORDER — CEFAZOLIN SODIUM-DEXTROSE 2-4 GM/100ML-% IV SOLN
2.0000 g | INTRAVENOUS | Status: AC
  Administered 2019-09-16: 2 g via INTRAVENOUS
  Filled 2019-09-16: qty 100

## 2019-09-16 MED ORDER — DEXAMETHASONE SODIUM PHOSPHATE 10 MG/ML IJ SOLN
INTRAMUSCULAR | Status: DC | PRN
  Administered 2019-09-16: 5 mg via INTRAVENOUS

## 2019-09-16 MED ORDER — PHENYLEPHRINE 40 MCG/ML (10ML) SYRINGE FOR IV PUSH (FOR BLOOD PRESSURE SUPPORT)
PREFILLED_SYRINGE | INTRAVENOUS | Status: AC
  Filled 2019-09-16: qty 10

## 2019-09-16 MED ORDER — PHENYLEPHRINE 40 MCG/ML (10ML) SYRINGE FOR IV PUSH (FOR BLOOD PRESSURE SUPPORT)
PREFILLED_SYRINGE | INTRAVENOUS | Status: DC | PRN
  Administered 2019-09-16: 100 ug via INTRAVENOUS

## 2019-09-16 MED ORDER — SODIUM CHLORIDE 0.9 % IR SOLN
Status: DC | PRN
  Administered 2019-09-16: 3000 mL via INTRAVESICAL

## 2019-09-16 SURGICAL SUPPLY — 13 items
BAG URO CATCHER STRL LF (MISCELLANEOUS) ×3 IMPLANT
CATH INTERMIT  6FR 70CM (CATHETERS) ×3 IMPLANT
CLOTH BEACON ORANGE TIMEOUT ST (SAFETY) ×3 IMPLANT
GLOVE BIOGEL M STRL SZ7.5 (GLOVE) ×3 IMPLANT
GOWN STRL REUS W/TWL LRG LVL3 (GOWN DISPOSABLE) ×6 IMPLANT
GUIDEWIRE STR DUAL SENSOR (WIRE) ×3 IMPLANT
KIT TURNOVER KIT A (KITS) IMPLANT
MANIFOLD NEPTUNE II (INSTRUMENTS) ×3 IMPLANT
STENT URET 6FRX24 CONTOUR (STENTS) ×4 IMPLANT
TRAY CYSTO PACK (CUSTOM PROCEDURE TRAY) ×3 IMPLANT
TUBING CONNECTING 10 (TUBING) ×2 IMPLANT
TUBING CONNECTING 10' (TUBING) ×1
TUBING UROLOGY SET (TUBING) IMPLANT

## 2019-09-16 NOTE — Progress Notes (Signed)
Patient ID: [name], male   DOB: 04-20-1932, 86 y.o.   MRN: [id number]  Day of Surgery Subjective: Pt stable over the weekend.  Feeling subjectively better after appropriate antibiotic therapy for cultures.  Objective: Vital signs in last 24 hours: Temp:  [97.3 F (36.3 C)-97.9 F (36.6 C)] 97.3 F (36.3 C) (05/24 0526) Pulse Rate:  [67-69] 67 (05/24 0526) Resp:  [17-19] 18 (05/24 0526) BP: (103-114)/(61-75) 103/63 (05/24 0526) SpO2:  [96 %-99 %] 96 % (05/24 0526)  Intake/Output from previous day: 05/23 0701 - 05/24 0700 In: 960 [P.O.:960] Out: 900 [Urine:900] Intake/Output this shift: No intake/output data recorded.  Physical Exam:  General: Alert and oriented CV: RRR Lungs: Clear   Lab Results: Recent Labs    09/16/19 0410  HGB 8.6*  HCT 28.0*   BMET Recent Labs    09/16/19 0410  NA 139  K 3.8  CL 109  CO2 22  GLUCOSE 130*  BUN 20  CREATININE 0.80  CALCIUM 8.2*     Studies/Results: No results found.  Assessment/Plan: 1) Bilateral ureteral obstruction/UTI/metastatic prostate cancer: Will proceed with cystoscopy and bilateral ureteral stent change this morning with plans for subsequent discharge home with hospice care per discussions with family and patient.     LOS: 4 days   Dutch Gray 09/16/2019, 7:55 AM

## 2019-09-16 NOTE — Progress Notes (Signed)
[facility] Liaison note.  Spoke with patient's daughter, [name] who states patient will discharge tomorrow, Tuesday.  Hospital liaison will follow in the morning. [name] states he will transport home by private vehicle.  Please send signed and completed DNR form home with patient/family. Patient will need prescriptions for discharge comfort medications.   [facility] aware of the above. Please notify [facility] when patient is ready to leave the unit at discharge. (Call [phone] or [phone] after 5pm.) Please call with any hospice related questions.      Thank you for this referral.      Farrel Gordon, RN, [facility] (listed on [place] under [place])   [phone]

## 2019-09-16 NOTE — Transfer of Care (Signed)
Immediate Anesthesia Transfer of Care Note  Patient: [name]  Procedure(s) Performed: CYSTOSCOPY WITH STENT CHANGE (Bilateral )  Patient Location: PACU  Anesthesia Type:General  Level of Consciousness: drowsy, patient cooperative and responds to stimulation  Airway & Oxygen Therapy: Patient Spontanous Breathing and Patient connected to face mask oxygen  Post-op Assessment: Report given to RN and Post -op Vital signs reviewed and stable  Post vital signs: Reviewed and stable  Last Vitals:  Vitals Value Taken Time  BP 116/67 09/16/19 0921  Temp    Pulse 66 09/16/19 0923  Resp 12 09/16/19 0923  SpO2 100 % 09/16/19 0923  Vitals shown include unvalidated device data.  Last Pain:  Vitals:   09/16/19 0800  TempSrc: Oral  PainSc:          Complications: No apparent anesthesia complications

## 2019-09-16 NOTE — Anesthesia Postprocedure Evaluation (Signed)
Anesthesia Post Note  Patient: [name]  Procedure(s) Performed: CYSTOSCOPY WITH STENT CHANGE (Bilateral )     Patient location during evaluation: PACU Anesthesia Type: General Level of consciousness: awake and alert, oriented and patient cooperative Pain management: pain level controlled Vital Signs Assessment: post-procedure vital signs reviewed and stable Respiratory status: spontaneous breathing, nonlabored ventilation and respiratory function stable Cardiovascular status: blood pressure returned to baseline and stable Postop Assessment: no apparent nausea or vomiting Anesthetic complications: no    Last Vitals:  Vitals:   09/16/19 0945 09/16/19 1013  BP: 119/64 120/65  Pulse: 73 73  Resp: 14 16  Temp: 36.7 C (!) 36.4 C  SpO2: 97% 100%    Last Pain:  Vitals:   09/16/19 1013  TempSrc: Oral  PainSc:                  Ayven Glasco,E. Delories Mauri

## 2019-09-16 NOTE — Progress Notes (Signed)
[name]  ZOX:[id number] DOB: [birth year] DOA: 09/11/2019 PCP: [name], MD    Brief Narrative:  86 year old with a history of paroxysmal atrial fibrillation no longer on Xarelto, CAD status post CABG, prostate cancer, non-Hodgkin's lymphoma, DM 2, and recurrent unidentified source of GI bleeding and hematuria who was sent to the ED from the urology clinic where he was found to have low blood pressure and altered mental status.  Significant Events: 5/20 admit via [facility]  Subjective: The patient is seen in the preop area this morning.  He is in good spirits.  He denies chest pain nausea vomiting or abdominal pain.  He is looking forward to getting his procedure completed and going home soon.  Assessment & Plan:  Sepsis due to ESBL Klebisella + Enterococcus urinary tract infection Culture data from [facility] provided by Dr. [name] and notes Kleb resistant to cefepime but sensitive to fluroquinolones -culture from this hospitalization with same organism plus Enterococcus - cont IV abx for extent of hospital stay, and then transition to oral regimen at home - per discussion w/ Pharmacy will utilize Cipro 500 mg twice daily and amoxicillin 500 mg every 8 hours  Hematuria - indwelling urinary stents Stents to be replaced in OR today by Dr. [name]  Acute metabolic encephalopathy Mental status has now returned to baseline with treatment of his UTI  Acute on chronic blood loss anemia Baseline hemoglobin 9.5 -hemoglobin stable/slightly improved on follow-up check today with range of 7.6-8.6 during this hospital stay  Mild hypokalemia Stable   DM 2 with peripheral neuropathy and modest hypoglycemia CBG reasonably controlled - liberalized diet - minimize CBG checks   HLD Lipitor stopped as it is not felt that will significantly benefit him at this stage  CAD status post CABG Asymptomatic  Gout No acute flares  Goals of care The patient tells me he is tired  of being hospitalized and that all he really cares about his being at home and being in his garden.  Palliative Care is assisting in this process, with the goal to d/c him home as soon as home Hospice assistance can be arranged.   DVT prophylaxis: SCDs Code Status: NO CODE BLUE Family Communication: No family present at time of exam today Disposition Plan:  Status is: Inpatient  Remains inpatient appropriate because: need for IV medication/abx - need for surgical procedure   Dispo: The patient is from: Home              Anticipated d/c is to: Home              Anticipated d/c date is: 09/17/19              Patient currently is not medically stable to d/c.  Consultants:  none  Antimicrobials:  Cefepime 5/19 > 5/21 Ceftriaxone 5/19 Meropenem 5/21 >  Objective: Blood pressure 103/63, pulse 67, temperature (!) 97.3 F (36.3 C), temperature source Oral, resp. rate 18, height 5\' 7"  (1.702 m), weight 63.5 kg, SpO2 96 %.  Intake/Output Summary (Last 24 hours) at 09/16/2019 0717 Last data filed at 09/16/2019 0000 Gross per 24 hour  Intake 960 ml  Output 900 ml  Net 60 ml   Filed Weights   09/12/19 0039  Weight: 63.5 kg    Examination: General: No acute respiratory distress Lungs: CTA B  Cardiovascular: RRR Abdomen: NT/ND, soft, bs+ Extremities: No signif edema B LE    CBC: Recent Labs  Lab 09/11/19 1850 09/12/19  2956 09/13/19 0451 09/16/19 0410  WBC 7.4 8.5 5.4 5.7  NEUTROABS 6.8  --   --   --   HGB 7.6* 8.7* 8.1* 8.6*  HCT 24.8* 27.4* 25.8* 28.0*  MCV 90.5 90.1 89.9 91.8  PLT 127* 126* 123* 213*   Basic Metabolic Panel: Recent Labs  Lab 09/11/19 1850 09/12/19 0855 09/13/19 0451 09/16/19 0410  NA 137 135 136 139  K 3.8 3.4* 3.4* 3.8  CL 106 107 109 109  CO2 23 21* 21* 22  GLUCOSE 84 65* 124* 130*  BUN 31* 24* 20 20  CREATININE 1.18 0.93 0.96 0.80  CALCIUM 8.8* 8.0* 8.1* 8.2*  MG  --  1.8 1.8  --    GFR: Estimated Creatinine Clearance: 60.6 mL/min (by  C-G formula based on SCr of 0.8 mg/dL).  Liver Function Tests: Recent Labs  Lab 09/11/19 1850  AST 90*  ALT 41  ALKPHOS 77  BILITOT 0.9  PROT 5.1*  ALBUMIN 2.4*    HbA1C: Hgb A1c MFr Bld  Date/Time Value Ref Range Status  09/12/2019 08:55 AM 5.5 4.8 - 5.6 % Final    Comment:    (NOTE) Pre diabetes:          5.7%-6.4% Diabetes:              >6.4% Glycemic control for   <7.0% adults with diabetes   04/05/2019 05:00 AM 7.4 (H) 4.8 - 5.6 % Final    Comment:    (NOTE) Pre diabetes:          5.7%-6.4% Diabetes:              >6.4% Glycemic control for   <7.0% adults with diabetes     CBG: Recent Labs  Lab 09/12/19 2108 09/13/19 0733 09/13/19 1135 09/13/19 1638 09/14/19 0816  GLUCAP 240* 104* 141* 219* 87    Recent Results (from the past 240 hour(s))  Urine culture     Status: Abnormal   Collection Time: 09/11/19  8:20 PM   Specimen: Urine, Clean Catch  Result Value Ref Range Status   Specimen Description   Final    URINE, CLEAN CATCH Performed at [facility], [place] [address], [place], [place] [address]    Special Requests   Final    NONE Performed at [facility], [place] [address], [place], [place] [address]    Culture (A)  Final    >=100,000 COLONIES/mL KLEBSIELLA PNEUMONIAE >=100,000 COLONIES/mL ENTEROCOCCUS FAECALIS THE KLEBSIELLA PNEUMONIAE IS A Confirmed Extended Spectrum Beta-Lactamase Producer (ESBL).  In bloodstream infections from ESBL organisms, carbapenems are preferred over piperacillin/tazobactam. They are shown to have a lower risk of mortality.    Report Status 09/15/2019 FINAL  Final   Organism ID, Bacteria KLEBSIELLA PNEUMONIAE (A)  Final   Organism ID, Bacteria ENTEROCOCCUS FAECALIS (A)  Final      Susceptibility   Enterococcus faecalis - MIC*    AMPICILLIN <=2 SENSITIVE Sensitive     NITROFURANTOIN <=16 SENSITIVE Sensitive     VANCOMYCIN 1 SENSITIVE Sensitive     * >=100,000 COLONIES/mL  ENTEROCOCCUS FAECALIS   Klebsiella pneumoniae - MIC*    AMPICILLIN >=32 RESISTANT Resistant     CEFAZOLIN >=64 RESISTANT Resistant     CEFTRIAXONE >=64 RESISTANT Resistant     CIPROFLOXACIN 1 SENSITIVE Sensitive     GENTAMICIN <=1 SENSITIVE Sensitive     IMIPENEM <=0.25 SENSITIVE Sensitive     NITROFURANTOIN 64 INTERMEDIATE Intermediate     TRIMETH/SULFA >=320  RESISTANT Resistant     AMPICILLIN/SULBACTAM >=32 RESISTANT Resistant     PIP/TAZO <=4 SENSITIVE Sensitive     * >=100,000 COLONIES/mL KLEBSIELLA PNEUMONIAE  Culture, blood (routine x 2)     Status: None (Preliminary result)   Collection Time: 09/11/19  8:20 PM   Specimen: BLOOD  Result Value Ref Range Status   Specimen Description   Final    BLOOD PORTA CATH Performed at [place] [address] [place], [place] [address]    Special Requests   Final    BOTTLES DRAWN AEROBIC AND ANAEROBIC Blood Culture adequate volume Performed at [place] [address], [place], [place] [address]    Culture   Final    NO GROWTH 4 DAYS Performed at [facility], [place] [address], [place], [place] [address]    Report Status PENDING  Incomplete  Culture, blood (routine x 2)     Status: None (Preliminary result)   Collection Time: 09/11/19  8:20 PM   Specimen: BLOOD  Result Value Ref Range Status   Specimen Description   Final    BLOOD LEFT ANTECUBITAL Performed at [place] [address] [place], [place] [address]    Special Requests   Final    BOTTLES DRAWN AEROBIC AND ANAEROBIC Blood Culture adequate volume Performed at [place] [address] [place], [place] [address]    Culture   Final    NO GROWTH 4 DAYS Performed at [facility], [place] [address] [place], [place] [address]    Report Status PENDING  Incomplete  SARS Coronavirus 2 by RT PCR (hospital order, performed in [facility] hospital lab) Nasopharyngeal  Nasopharyngeal Swab     Status: None   Collection Time: 09/12/19  1:50 AM   Specimen: Nasopharyngeal Swab  Result Value Ref Range Status   SARS Coronavirus 2 NEGATIVE NEGATIVE Final    Comment: (NOTE) SARS-CoV-2 target nucleic acids are NOT DETECTED. The SARS-CoV-2 RNA is generally detectable in upper and lower respiratory specimens during the acute phase of infection. The lowest concentration of SARS-CoV-2 viral copies this assay can detect is 250 copies / mL. A negative result does not preclude SARS-CoV-2 infection and should not be used as the sole basis for treatment or other patient management decisions.  A negative result may occur with improper specimen collection / handling, submission of specimen other than nasopharyngeal swab, presence of viral mutation(s) within the areas targeted by this assay, and inadequate number of viral copies (<250 copies / mL). A negative result must be combined with clinical observations, patient history, and epidemiological information. Fact Sheet for Patients:   StrictlyIdeas.no Fact Sheet for Healthcare Providers: BankingDealers.co.za This test is not yet approved or cleared  by the [place] FDA and has been authorized for detection and/or diagnosis of SARS-CoV-2 by FDA under an Emergency Use Authorization (EUA).  This EUA will remain in effect (meaning this test can be used) for the duration of the COVID-19 declaration under Section 564(b)(1) of the Act, 21 U.S.C. section 360bbb-3(b)(1), unless the authorization is terminated or revoked sooner. Performed at [facility], [place] [address] [place], [place] [address]   Surgical pcr screen     Status: None   Collection Time: 09/16/19  3:14 AM   Specimen: Nasal Mucosa; Nasal Swab  Result Value Ref Range Status   MRSA, PCR NEGATIVE NEGATIVE Final   Staphylococcus aureus NEGATIVE NEGATIVE Final  Comment: (NOTE) The Xpert SA  Assay (FDA approved for NASAL specimens in patients 77 years of age and older), is one component of a comprehensive surveillance program. It is not intended to diagnose infection nor to guide or monitor treatment. Performed at [facility], [place] [address], [place], [place] [address]      Scheduled Meds: . allopurinol  300 mg Oral QPC breakfast  . ferrous sulfate  325 mg Oral BID WC  . gabapentin  300 mg Oral BID  . heparin  5,000 Units Subcutaneous Q8H  . latanoprost  1 drop Both Eyes QHS  . lipase/protease/amylase  12,000 Units Oral TID WC  . loratadine  10 mg Oral Daily  . sertraline  100 mg Oral QPC breakfast  . vitamin B-12  2,500 mcg Oral q morning - 10a   Continuous Infusions: . sodium chloride 10 mL/hr at 09/15/19 0400  . meropenem (MERREM) IV 1 g (09/16/19 0459)     LOS: 4 days    Cherene Altes, MD [facility] Office  [phone] Pager - Text Page per Amion as per below:  On-Call/Text Page:      [email or web address]  If 7PM-7AM, please contact night-coverage [email or web address] 09/16/2019, 7:17 AM

## 2019-09-16 NOTE — Op Note (Signed)
Preoperative diagnosis:  1. Bilateral ureteral obstruction 2. Metastatic prostate cancer   Postoperative diagnosis:  1. Bilateral ureteral obstruction 2. Metastatic prostate cancer   Procedure:  1. Cystoscopy 2. Bilateral ureteral stent placement (6 x 24 - no string)  Surgeon: Roxy Horseman, Brooke Bonito. M.D.  Anesthesia: General  Complications: None  Intraoperative findings: Indwelling ureteral stents were mildly to moderately encrusted.  EBL: Minimal  Specimens: None  Indication: [name] is a 86 y.o. patient with bilateral ureteral obstruction. After reviewing the management options for treatment, he elected to proceed with the above surgical procedure(s). We have discussed the potential benefits and risks of the procedure, side effects of the proposed treatment, the likelihood of the patient achieving the goals of the procedure, and any potential problems that might occur during the procedure or recuperation. Informed consent has been obtained.  Description of procedure:  The patient was taken to the operating room and general anesthesia was induced.  The patient was placed in the dorsal lithotomy position, prepped and draped in the usual sterile fashion, and preoperative antibiotics were administered. A preoperative time-out was performed.   Cystourethroscopy was performed.  The patient's urethra was examined and was unremarkable. The bladder was then systematically examined in its entirety. There was no evidence for any bladder tumors, stones, or other mucosal pathology.    Attention then turned to the left ureteral orifice and the patient's indwelling ureteral stent was identified and brought out to the urethral meatus with the flexible graspers.  A 0.38 sensor guidewire was then advanced up the left ureter into the renal pelvis under fluoroscopic guidance.  The wire was then backloaded through the cystoscope and a ureteral stent was advance over the wire using Seldinger  technique.  The stent was positioned appropriately under fluoroscopic and cystoscopic guidance.  The wire was then removed with an adequate stent curl noted in the renal pelvis as well as in the bladder.  Attention then turned to the right ureteral orifice and the patient's indwelling ureteral stent was identified and brought out to the urethral meatus with the flexible graspers.  A 0.38 sensor guidewire was then advanced up the right ureter into the renal pelvis under fluoroscopic guidance.  The wire was then backloaded through the cystoscope and a ureteral stent was advance over the wire using Seldinger technique.  The stent was positioned appropriately under fluoroscopic and cystoscopic guidance.  The wire was then removed with an adequate stent curl noted in the renal pelvis as well as in the bladder.  The bladder was then emptied and the procedure ended.  The patient appeared to tolerate the procedure well and without complications.  The patient was able to be awakened and transferred to the recovery unit in satisfactory condition.    Pryor Curia MD

## 2019-09-16 NOTE — Anesthesia Procedure Notes (Signed)
Procedure Name: LMA Insertion Date/Time: 09/16/2019 8:47 AM Performed by: Glory Buff, CRNA Pre-anesthesia Checklist: Patient identified, Emergency Drugs available, Suction available and Patient being monitored Patient Re-evaluated:Patient Re-evaluated prior to induction Oxygen Delivery Method: Circle system utilized Preoxygenation: Pre-oxygenation with 100% oxygen Induction Type: IV induction LMA: LMA inserted LMA Size: 4.0 Number of attempts: 1 Placement Confirmation: positive ETCO2 and breath sounds checked- equal and bilateral Tube secured with: Tape Dental Injury: Teeth and Oropharynx as per pre-operative assessment

## 2019-09-16 NOTE — Anesthesia Preprocedure Evaluation (Addendum)
Anesthesia Evaluation  Patient identified by MRN, date of birth, ID band Patient awake    Reviewed: Allergy & Precautions, NPO status , Patient's Chart, lab work & pertinent test results  History of Anesthesia Complications Negative for: history of anesthetic complications  Airway Mallampati: II  TM Distance: >3 FB Neck ROM: Full    Dental  (+) Edentulous Upper, Dental Advisory Given   Pulmonary shortness of breath, sleep apnea ,  09/12/2019 SARS coronavirus NEG  H/o 03/2019 COVID-19 infection   breath sounds clear to auscultation       Cardiovascular hypertension (on no meds presently), + CAD (medical management)  + dysrhythmias Atrial Fibrillation  Rhythm:Regular Rate:Normal  10/2018 ECHO: EF 55-60%, mild MR   Neuro/Psych negative neurological ROS     GI/Hepatic Neg liver ROS, GERD  Controlled,  Endo/Other  diabetes (glu 130), Oral Hypoglycemic Agents  Renal/GU Renal InsufficiencyRenal disease   Prostate cancer    Musculoskeletal  (+) Arthritis ,   Abdominal   Peds  Hematology Non-Hodgkin's lymphoma Hb 8.6, plt 134k Has been on Xarelto: off presently   Anesthesia Other Findings   Reproductive/Obstetrics                            Anesthesia Physical Anesthesia Plan  ASA: III  Anesthesia Plan: General   Post-op Pain Management:    Induction: Intravenous  PONV Risk Score and Plan: 2 and Ondansetron and Treatment may vary due to age or medical condition  Airway Management Planned: LMA  Additional Equipment:   Intra-op Plan:   Post-operative Plan:   Informed Consent: I have reviewed the patients History and Physical, chart, labs and discussed the procedure including the risks, benefits and alternatives for the proposed anesthesia with the patient or authorized representative who has indicated his/her understanding and acceptance.   Patient has DNR.  Discussed DNR with  patient, Discussed DNR with power of attorney and Suspend DNR.   Dental advisory given and Consent reviewed with POA  Plan Discussed with: Surgeon and CRNA  Anesthesia Plan Comments: (Discussed with daughter, [name], by telephone)       Anesthesia Quick Evaluation

## 2019-09-16 NOTE — Progress Notes (Signed)
Pharmacy Antibiotic Note  [name] is a 86 y.o. male admitted on 09/11/2019 with sepsis secondary to UTI.  Per notes, urine culture data from [facility] indicates ESBL Klebsiella pneumoniae. Urine cultures here growing > 100K ESBL Klebsiella pneumoniae and pan-sensitive Enterococcus faecalis. Pharmacy has been consulted for Meropenem dosing.  Plan: Continue Meropenem 1g IV q8h Monitor renal function, cultures, clinical course  Height: 5\' 7"  (170.2 cm) Weight: 63.5 kg (140 lb) IBW/kg (Calculated) : 66.1  Temp (24hrs), Avg:97.7 F (36.5 C), Min:97.3 F (36.3 C), Max:98.1 F (36.7 C)  Recent Labs  Lab 09/11/19 1850 09/11/19 2020 09/12/19 0855 09/13/19 0451 09/16/19 0410  WBC 7.4  --  8.5 5.4 5.7  CREATININE 1.18  --  0.93 0.96 0.80  LATICACIDVEN  --  0.6  --   --   --     Estimated Creatinine Clearance: 60.6 mL/min (by C-G formula based on SCr of 0.8 mg/dL).    Allergies  Allergen Reactions  . Atenolol Other (See Comments)    "Heart rate slowed- STOPPED on 08/16/2013"  . Chlorhexidine     Port Not accessed, contraindicated  . Niacin Other (See Comments)    headaches    Antimicrobials this admission: 5/19 Ceftriaxone x 1 5/20 Cefepime >> 5/21 5/21 Meropenem >> 5/24 Cefazolin x 1 pre-op   Dose adjustments this admission: --  Microbiology results: 5/19 BCx: NGF 5/19 UCx: > 100K ESBL Klebsiella pneumoniae, pan-sensitive Enterococcus faecalis  5/20 COVID: negative  5/24 Surgical PCR: negative   Thank you for allowing pharmacy to be a part of this patient's care.   Lindell Spar, PharmD, BCPS Clinical Pharmacist  09/16/2019 12:40 PM

## 2019-09-17 DIAGNOSIS — Z515 Encounter for palliative care: Secondary | ICD-10-CM

## 2019-09-17 MED ORDER — CIPROFLOXACIN HCL 500 MG PO TABS: 500.0000 mg | ORAL_TABLET | Freq: Two times a day (BID) | ORAL | 0 refills | Status: AC

## 2019-09-17 MED ORDER — AMOXICILLIN 500 MG PO CAPS: 500.0000 mg | ORAL_CAPSULE | Freq: Three times a day (TID) | ORAL | 0 refills | Status: AC

## 2019-09-17 MED ORDER — ACETAMINOPHEN 325 MG PO TABS: 650.0000 mg | ORAL_TABLET | Freq: Four times a day (QID) | ORAL | Status: AC | PRN

## 2019-09-17 MED ORDER — OXYCODONE HCL 5 MG PO TABS: 5.0000 mg | ORAL_TABLET | ORAL | 0 refills | Status: AC | PRN

## 2019-09-17 MED ORDER — HEPARIN SOD (PORK) LOCK FLUSH 100 UNIT/ML IV SOLN
500.0000 [IU] | INTRAVENOUS | Status: AC | PRN
  Administered 2019-09-17: 500 [IU]
  Filled 2019-09-17: qty 5

## 2019-09-17 NOTE — Discharge Summary (Signed)
DISCHARGE SUMMARY  [name]  MR#: [id number]  DOB:1932/03/19  Date of Admission: 09/11/2019 Date of Discharge: 09/17/2019  Attending Physician:[name]  Patient's KPT:[id number], [name]  Consults:  Urology  Palliative Care   Disposition: D/C home for comfort focused care   Follow-up Appts: Follow-up Information    [name] Follow up.   Specialty: Urology Why: 01/01/20 at 3:15 PM - but ok to call and cancel if patient is on purely comfort care measures and prefers to stop androgen deprivation systemic therapy for prostate cancer Contact information: [place] [address] [phone]        [name] Follow up.   Specialty: Internal Medicine Why: as needed Contact information: [address] [place] [address] [phone]        [name] .   Specialty: Cardiology Contact information: [address] [place] [address] [phone]           Discharge Diagnoses: Sepsis due to ESBL Klebisella + Enterococcus complex urinary tract infection Hematuria - indwelling urinary stents Acute metabolic encephalopathy Acute on chronic blood loss anemia Mild hypokalemia DM 2 with peripheral neuropathy and modest hypoglycemia HLD CAD status post CABG Gout NO CODE BLUE - DNR    Initial presentation: 86 year old with a history of paroxysmal atrial fibrillation no longer on Xarelto, CAD status post CABG, prostate cancer, non-Hodgkin's lymphoma, DM 2, and recurrent unidentified source of GI bleeding and hematuria who was sent to the ED from the urology clinic where he was found to have low blood pressure and altered mental status.  Hospital Course:  Sepsis due to ESBL Klebisella + Enterococcus urinary tract infection Culture data from [facility] provided by [name] and notes Kleb resistant to cefepime but sensitive to fluroquinolones -culture from this  hospitalization with same organism plus Enterococcus - continued IV abx for extent of hospital stay, and then transitioned to oral regimen at home - per discussion w/ Pharmacy will utilize Cipro 500 mg twice daily and amoxicillin 500 mg every 8 hours to complete abx course post-discharge  Hematuria - indwelling urinary stents Stents replaced in OR 5/24 w/o difficulty by [name]  Acute metabolic encephalopathy Mental status has now returned to baseline with treatment of his UTI  Acute on chronic blood loss anemia Prior baseline hemoglobin ~ 9.5 - hemoglobin stable this admit with range of 7.6-8.6 during this hospital stay  Mild hypokalemia corrected   DM 2 with peripheral neuropathy and modest hypoglycemia CBG well controlled during this admit - liberalized diet - minimized CBG checks - given A1c of 5.5 it is not felt helpful to the patient at this time to continue to strive for strict control of his CBG   HLD Lipitor stopped as it is not felt that will significantly benefit him at this stage  CAD status post CABG Asymptomatic  Gout No acute flares  Goals of care The patient tells me he is tired of being hospitalized and that all he really cares about his being at home and being in his garden.  Palliative Care is assisting in this process, with the goal to d/c him home as soon as home Hospice assistance can be arranged. Many of his chronic medications were discontinued during this admit, as they were not felt to be directly contributing to his comfort/daily quality of life. His home medications can be further minimized depending upon how he does once back home w/ Hospice assistance.  Allergies as of 09/17/2019      Reactions   Atenolol Other (See Comments)   "Heart rate slowed- STOPPED on 08/16/2013"   Chlorhexidine    Port Not accessed, contraindicated   Niacin Other (See Comments)   headaches      Medication List    STOP taking these medications   atorvastatin  20 MG tablet Commonly known as: LIPITOR   CALCIUM 600/VITAMIN D3 PO   Droplet Pen Needles 32G X 4 MM Misc Generic drug: Insulin Pen Needle   fenofibrate 160 MG tablet   ferrous sulfate 325 (65 FE) MG tablet   magnesium oxide 400 MG tablet Commonly known as: MAG-OX   metFORMIN 500 MG 24 hr tablet Commonly known as: GLUCOPHAGE-XR   One-A-Day Mens 50+ Advantage Tabs   Toujeo SoloStar 300 UNIT/ML Solostar Pen Generic drug: insulin glargine (1 Unit Dial)   vitamin C 500 MG tablet Commonly known as: ASCORBIC ACID     TAKE these medications   acetaminophen 325 MG tablet Commonly known as: TYLENOL Take 2 tablets (650 mg total) by mouth every 6 (six) hours as needed for mild pain (or Fever >/= 101).   allopurinol 300 MG tablet Commonly known as: ZYLOPRIM Take 300 mg by mouth daily after breakfast.   amoxicillin 500 MG capsule Commonly known as: AMOXIL Take 1 capsule (500 mg total) by mouth every 8 (eight) hours for 4 days.   cetirizine 10 MG tablet Commonly known as: ZYRTEC Take 10 mg by mouth daily.   cholestyramine 4 g packet Commonly known as: QUESTRAN Take 4 g by mouth daily as needed (diarrhea).   ciprofloxacin 500 MG tablet Commonly known as: CIPRO Take 1 tablet (500 mg total) by mouth 2 (two) times daily for 4 days.   gabapentin 300 MG capsule Commonly known as: NEURONTIN Take 300 mg by mouth 2 (two) times daily.   latanoprost 0.005 % ophthalmic solution Commonly known as: XALATAN Place 1 drop into both eyes at bedtime.   leuprolide 22.5 MG injection Commonly known as: LUPRON Inject 22.5 mg into the muscle every 3 (three) months.   lidocaine-prilocaine cream Commonly known as: EMLA Apply 1 application topically as needed (port).   lipase/protease/amylase 12000-38000 units Cpep capsule Commonly known as: CREON Take 12,000 Units by mouth 3 (three) times daily with meals.   oxyCODONE 5 MG immediate release tablet Commonly known as: Oxy  IR/ROXICODONE Take 1 tablet (5 mg total) by mouth every 4 (four) hours as needed for moderate pain or severe pain.   polyethylene glycol powder 17 GM/SCOOP powder Commonly known as: MiraLax Take 17 g by mouth 2 (two) times daily as needed for moderate constipation.   senna-docusate 8.6-50 MG tablet Commonly known as: Senokot-S Take 1 tablet by mouth 2 (two) times daily between meals as needed for mild constipation.   sertraline 100 MG tablet Commonly known as: ZOLOFT Take 100 mg by mouth daily after breakfast.   Vitamin B-12 2500 MCG Subl Place 2,500 mcg under the tongue every morning.       Day of Discharge BP 114/68 (BP Location: Right Arm)   Pulse 72   Temp 97.7 F (36.5 C) (Oral)   Resp 17   Ht 5\' 7"  (1.702 m)   Wt 63.5 kg   SpO2 98%   BMI 21.93 kg/m   Physical Exam: General: No acute respiratory distress Lungs: Clear to auscultation bilaterally without wheezes or crackles Cardiovascular: Regular rate and rhythm without murmur  Abdomen: Nontender, nondistended, soft, bowel sounds  positive, no rebound, no ascites, no appreciable mass Extremities: No significant cyanosis, clubbing, or edema bilateral lower extremities  Basic Metabolic Panel: Recent Labs  Lab 09/11/19 1850 09/12/19 0855 09/13/19 0451 09/16/19 0410  NA 137 135 136 139  K 3.8 3.4* 3.4* 3.8  CL 106 107 109 109  CO2 23 21* 21* 22  GLUCOSE 84 65* 124* 130*  BUN 31* 24* 20 20  CREATININE 1.18 0.93 0.96 0.80  CALCIUM 8.8* 8.0* 8.1* 8.2*  MG  --  1.8 1.8  --     Liver Function Tests: Recent Labs  Lab 09/11/19 1850  AST 90*  ALT 41  ALKPHOS 77  BILITOT 0.9  PROT 5.1*  ALBUMIN 2.4*    CBC: Recent Labs  Lab 09/11/19 1850 09/12/19 0855 09/13/19 0451 09/16/19 0410  WBC 7.4 8.5 5.4 5.7  NEUTROABS 6.8  --   --   --   HGB 7.6* 8.7* 8.1* 8.6*  HCT 24.8* 27.4* 25.8* 28.0*  MCV 90.5 90.1 89.9 91.8  PLT 127* 126* 123* 134*    CBG: Recent Labs  Lab 09/13/19 0733 09/13/19 1135  09/13/19 1638 09/14/19 0816 09/16/19 0831  GLUCAP 104* 141* 219* 87 104*    Recent Results (from the past 240 hour(s))  Urine culture     Status: Abnormal   Collection Time: 09/11/19  8:20 PM   Specimen: Urine, Clean Catch  Result Value Ref Range Status   Specimen Description   Final    URINE, CLEAN CATCH Performed at [facility], [place] [address], [place], [place] [address]    Special Requests   Final    NONE Performed at [facility], [place] [address], [place], [place] [address]    Culture (A)  Final    >=100,000 COLONIES/mL KLEBSIELLA PNEUMONIAE >=100,000 COLONIES/mL ENTEROCOCCUS FAECALIS THE KLEBSIELLA PNEUMONIAE IS A Confirmed Extended Spectrum Beta-Lactamase Producer (ESBL).  In bloodstream infections from ESBL organisms, carbapenems are preferred over piperacillin/tazobactam. They are shown to have a lower risk of mortality.    Report Status 09/15/2019 FINAL  Final   Organism ID, Bacteria KLEBSIELLA PNEUMONIAE (A)  Final   Organism ID, Bacteria ENTEROCOCCUS FAECALIS (A)  Final      Susceptibility   Enterococcus faecalis - MIC*    AMPICILLIN <=2 SENSITIVE Sensitive     NITROFURANTOIN <=16 SENSITIVE Sensitive     VANCOMYCIN 1 SENSITIVE Sensitive     * >=100,000 COLONIES/mL ENTEROCOCCUS FAECALIS   Klebsiella pneumoniae - MIC*    AMPICILLIN >=32 RESISTANT Resistant     CEFAZOLIN >=64 RESISTANT Resistant     CEFTRIAXONE >=64 RESISTANT Resistant     CIPROFLOXACIN 1 SENSITIVE Sensitive     GENTAMICIN <=1 SENSITIVE Sensitive     IMIPENEM <=0.25 SENSITIVE Sensitive     NITROFURANTOIN 64 INTERMEDIATE Intermediate     TRIMETH/SULFA >=320 RESISTANT Resistant     AMPICILLIN/SULBACTAM >=32 RESISTANT Resistant     PIP/TAZO <=4 SENSITIVE Sensitive     * >=100,000 COLONIES/mL KLEBSIELLA PNEUMONIAE  Culture, blood (routine x 2)     Status: None   Collection Time: 09/11/19  8:20 PM   Specimen: BLOOD  Result Value Ref Range Status   Specimen  Description   Final    BLOOD PORTA CATH Performed at [place] [address] [place], [place] [address]    Special Requests   Final    BOTTLES DRAWN AEROBIC AND ANAEROBIC Blood Culture adequate volume Performed at [facility], [place] [address]    Culture   Final    NO GROWTH 5 DAYS Performed at [facility], [place] [address], [place], [place] [address]    Report Status 09/16/2019 FINAL  Final  Culture, blood (routine x 2)     Status: None   Collection Time: 09/11/19  8:20 PM   Specimen: BLOOD  Result Value Ref Range Status   Specimen Description   Final    BLOOD LEFT ANTECUBITAL Performed at [place] [address], [place], [place] [address]    Special Requests   Final    BOTTLES DRAWN AEROBIC AND ANAEROBIC Blood Culture adequate volume Performed at [place] [address], [place], [place] [address]    Culture   Final    NO GROWTH 5 DAYS Performed at [facility], [place] [address] [place], [place] [address]    Report Status 09/16/2019 FINAL  Final  SARS Coronavirus 2 by RT PCR (hospital order, performed in [facility] hospital lab) Nasopharyngeal Nasopharyngeal Swab     Status: None   Collection Time: 09/12/19  1:50 AM   Specimen: Nasopharyngeal Swab  Result Value Ref Range Status   SARS Coronavirus 2 NEGATIVE NEGATIVE Final    Comment: (NOTE) SARS-CoV-2 target nucleic acids are NOT DETECTED. The SARS-CoV-2 RNA is generally detectable in upper and lower respiratory specimens during the acute phase of infection. The lowest concentration of SARS-CoV-2 viral copies this assay can detect is 250 copies / mL. A negative result does not preclude SARS-CoV-2 infection and should not be used as the sole basis for treatment or other patient management decisions.  A negative result may occur with improper specimen collection / handling, submission of  specimen other than nasopharyngeal swab, presence of viral mutation(s) within the areas targeted by this assay, and inadequate number of viral copies (<250 copies / mL). A negative result must be combined with clinical observations, patient history, and epidemiological information. Fact Sheet for Patients:   StrictlyIdeas.no Fact Sheet for Healthcare Providers: BankingDealers.co.za This test is not yet approved or cleared  by the [place] FDA and has been authorized for detection and/or diagnosis of SARS-CoV-2 by FDA under an Emergency Use Authorization (EUA).  This EUA will remain in effect (meaning this test can be used) for the duration of the COVID-19 declaration under Section 564(b)(1) of the Act, 21 U.S.C. section 360bbb-3(b)(1), unless the authorization is terminated or revoked sooner. Performed at [facility], [place] [address] [place], [place] [address]   Surgical pcr screen     Status: None   Collection Time: 09/16/19  3:14 AM   Specimen: Nasal Mucosa; Nasal Swab  Result Value Ref Range Status   MRSA, PCR NEGATIVE NEGATIVE Final   Staphylococcus aureus NEGATIVE NEGATIVE Final    Comment: (NOTE) The Xpert SA Assay (FDA approved for NASAL specimens in patients 23 years of age and older), is one component of a comprehensive surveillance program. It is not intended to diagnose infection nor to guide or monitor treatment. Performed at [facility], [place] [address], [place], [place] [address]       Time spent in discharge (includes decision making & examination of pt): 35 minutes  09/17/2019, 8:02 AM   Cherene Altes, [name] [facility] Office  [phone]

## 2019-09-17 NOTE — Care Management Important Message (Signed)
Important Message  Patient Details IM Letter given to Roque Lias SW Case Manager to present to the Patient Name: [name] MRN: [id number] Date of Birth: 28-Mar-1932   Medicare Important Message Given:  Yes     Kerin Salen 09/17/2019, 10:28 AM

## 2019-09-17 NOTE — Discharge Instructions (Signed)
Hospice Hospice is a service that is designed to provide people who are terminally ill and their families with medical, spiritual, and psychological support. Its aim is to improve your quality of life by keeping you as comfortable as possible in the final stages of life. Who will be my providers when I begin hospice care? Hospice teams often include:  A nurse.  A doctor. The hospice doctor will be available for your care, but you can include your regular doctor or nurse practitioner.  A social worker.  A counselor.  A religious leader (such as a chaplain).  A dietitian.  Therapists.  Trained volunteers who can help with care. What services does hospice provide? Hospice services can vary depending on the center or organization. Generally, they include:  Ways to keep you comfortable, such as: ? Providing care in your home or in a home-like setting. ? Working with your family and friends to help meet your needs. ? Allowing you to enjoy the support of loved ones by receiving much of your basic care from family and friends.  Pain relief and symptom management. The staff will supply all necessary medicines and equipment so that you can stay comfortable and alert enough to enjoy the company of your friends and family.  Visits or care from a nurse and doctor. This may include 24-hour on-call services.  Companionship when you are alone.  Allowing you and your family to rest. Hospice staff may do light housekeeping, prepare meals, and run errands.  Counseling. They will make sure your emotional, spiritual, and social needs are being met, as well as those needs of your family members.  Spiritual care. This will be individualized to meet your needs and your family's needs. It may involve: ? Helping you and your family understand the dying process. ? Helping you say goodbye to your family and friends. ? Performing a specific religious ceremony or ritual.  Massage.  Nutrition  therapy.  Physical and occupational therapy.  Short-term inpatient care, if something cannot be managed in the home.  Art or music therapy.  Bereavement support for grieving family members. When should hospice care begin? Most people who use hospice are believed to have less than 6 months to live.  Your family and health care providers can help you decide when hospice services should begin.  If you live longer than 6 months but your condition does not improve, your doctor may be able to approve you for continued hospice care.  If your condition improves, you may discontinue the program. What should I consider before selecting a program? Most hospice programs are run by nonprofit, independent organizations. Some are affiliated with hospitals, nursing homes, or home health care agencies. Hospice programs can take place in your home or at a hospice center, hospital, or skilled nursing facility. When choosing a hospice program, ask the following questions:  What services are available to me?  What services will be offered to my loved ones?  How involved will my loved ones be?  How involved will my health care provider be?  Who makes up the hospice care team? How are they trained or screened?  How will my pain and symptoms be managed?  If my circumstances change, can the services be provided in a different setting, such as my home or in the hospital?  Is the program reviewed and licensed by the state or certified in some other way?  What does it cost? Is it covered by insurance?  If I choose a hospice   center or nursing home, where is the hospice center located? Is it convenient for family and friends?  If I choose a hospice center or nursing home, can my family and friends visit any time?  Will you provide emotional and spiritual support?  Who can my family call with questions? Where can I learn more about hospice? You can learn about existing hospice programs in your area  from your health care providers. You can also read more about hospice online. The websites of the following organizations have helpful information:  [facility] and Palliative Care Organization [facility]): http://www.brown-buchanan.com/  National Association for [facility] [facility]): http://massey-hart.com/  Hospice Foundation of America ([place]): www.hospicefoundation.org  American Cancer Society (ACS): www.cancer.org  Hospice Net: www.hospicenet.org  Visiting Nurse Associations of [place] (VNAA): www.vnaa.org You may also find more information by contacting the following agencies:  A local agency on aging.  Your local [employer] chapter.  Your state's department of health or social services. Summary  Hospice is a service that is designed to provide people who are terminally ill and their families with medical, spiritual, and psychological support.  Hospice aims to improve your quality of life by keeping you as comfortable as possible in the final stages of life.  Hospice teams often include a doctor, nurse, social worker, counselor, religious leader,dietitian, therapists, and volunteers.  Hospice care generally includes medicine for symptom management, visits from doctors and nurses, physical and occupational therapy, nutrition counseling, spiritual and emotional counseling, caregiver support, and bereavement support for grieving family members.  Hospice programs can take place in your home or at a hospice center, hospital, or skilled nursing facility. This information is not intended to replace advice given to you by your health care provider. Make sure you discuss any questions you have with your health care provider. Document Revised: 01/02/2019 Document Reviewed: 05/03/2016 Elsevier Patient Education  [place].

## 2019-09-20 ENCOUNTER — Telehealth: Payer: Self-pay | Admitting: Podiatry

## 2019-09-20 NOTE — Telephone Encounter (Signed)
Pt daughter called and stated that pt has opted to go to hospice they requested that the wound supplies stop please but the pt daughter [name] just wanted to inform Dr.Wagoner

## 2019-09-24 ENCOUNTER — Ambulatory Visit: Payer: Medicare Other | Admitting: Podiatry

## 2019-09-24 NOTE — Telephone Encounter (Signed)
[name]- can you call to cancel supplies. He has an appointment Thursday to come in.

## 2019-09-26 ENCOUNTER — Telehealth: Payer: Self-pay | Admitting: Podiatry

## 2019-09-26 ENCOUNTER — Ambulatory Visit: Payer: Medicare Other | Admitting: Podiatry

## 2019-09-26 NOTE — Telephone Encounter (Signed)
[name] or Val- can you please cancel the supplies. Thanks.

## 2019-09-26 NOTE — Telephone Encounter (Signed)
Pt daughter called to inform us that pt has gone to hospice care and will no longer need supplies sent for the wound that the pt had been receiving.

## 2019-09-27 NOTE — Telephone Encounter (Signed)
Called and left a message for the daughter [name]) to let me know which company they were using for the supplies. [name]

## 2019-09-27 NOTE — Telephone Encounter (Signed)
[name] - [name] states pt is not in their system from 2020.

## 2019-10-09 ENCOUNTER — Ambulatory Visit: Payer: Medicare Other | Admitting: Cardiovascular Disease

## 2019-10-24 DIAGNOSIS — Z8709 Personal history of other diseases of the respiratory system: Secondary | ICD-10-CM | POA: Diagnosis not present

## 2019-10-24 DIAGNOSIS — Z960 Presence of urogenital implants: Secondary | ICD-10-CM | POA: Diagnosis not present

## 2019-10-24 DIAGNOSIS — N390 Urinary tract infection, site not specified: Secondary | ICD-10-CM | POA: Diagnosis not present

## 2019-10-24 DIAGNOSIS — D630 Anemia in neoplastic disease: Secondary | ICD-10-CM | POA: Diagnosis not present

## 2019-10-24 DIAGNOSIS — Z9989 Dependence on other enabling machines and devices: Secondary | ICD-10-CM | POA: Diagnosis not present

## 2019-10-24 DIAGNOSIS — R634 Abnormal weight loss: Secondary | ICD-10-CM | POA: Diagnosis not present

## 2019-10-24 DIAGNOSIS — Z8616 Personal history of COVID-19: Secondary | ICD-10-CM | POA: Diagnosis not present

## 2019-10-24 DIAGNOSIS — C8590 Non-Hodgkin lymphoma, unspecified, unspecified site: Secondary | ICD-10-CM | POA: Diagnosis not present

## 2019-10-24 DIAGNOSIS — K521 Toxic gastroenteritis and colitis: Secondary | ICD-10-CM | POA: Diagnosis not present

## 2019-10-24 DIAGNOSIS — T451X5D Adverse effect of antineoplastic and immunosuppressive drugs, subsequent encounter: Secondary | ICD-10-CM | POA: Diagnosis not present

## 2019-10-24 DIAGNOSIS — N184 Chronic kidney disease, stage 4 (severe): Secondary | ICD-10-CM | POA: Diagnosis not present

## 2019-10-24 DIAGNOSIS — Z8719 Personal history of other diseases of the digestive system: Secondary | ICD-10-CM | POA: Diagnosis not present

## 2019-10-24 DIAGNOSIS — L89892 Pressure ulcer of other site, stage 2: Secondary | ICD-10-CM | POA: Diagnosis not present

## 2019-10-24 DIAGNOSIS — E1122 Type 2 diabetes mellitus with diabetic chronic kidney disease: Secondary | ICD-10-CM | POA: Diagnosis not present

## 2019-10-24 DIAGNOSIS — G4733 Obstructive sleep apnea (adult) (pediatric): Secondary | ICD-10-CM | POA: Diagnosis not present

## 2019-10-24 DIAGNOSIS — I4891 Unspecified atrial fibrillation: Secondary | ICD-10-CM | POA: Diagnosis not present

## 2019-10-24 DIAGNOSIS — E538 Deficiency of other specified B group vitamins: Secondary | ICD-10-CM | POA: Diagnosis not present

## 2019-10-24 DIAGNOSIS — I129 Hypertensive chronic kidney disease with stage 1 through stage 4 chronic kidney disease, or unspecified chronic kidney disease: Secondary | ICD-10-CM | POA: Diagnosis not present

## 2019-10-24 DIAGNOSIS — R627 Adult failure to thrive: Secondary | ICD-10-CM | POA: Diagnosis not present

## 2019-10-24 DIAGNOSIS — C787 Secondary malignant neoplasm of liver and intrahepatic bile duct: Secondary | ICD-10-CM | POA: Diagnosis not present

## 2019-10-24 DIAGNOSIS — E43 Unspecified severe protein-calorie malnutrition: Secondary | ICD-10-CM | POA: Diagnosis not present

## 2019-10-24 DIAGNOSIS — R296 Repeated falls: Secondary | ICD-10-CM | POA: Diagnosis not present

## 2019-10-24 DIAGNOSIS — I2510 Atherosclerotic heart disease of native coronary artery without angina pectoris: Secondary | ICD-10-CM | POA: Diagnosis not present

## 2019-10-24 DIAGNOSIS — A419 Sepsis, unspecified organism: Secondary | ICD-10-CM | POA: Diagnosis not present

## 2019-10-24 DIAGNOSIS — C61 Malignant neoplasm of prostate: Secondary | ICD-10-CM | POA: Diagnosis not present

## 2019-10-28 DIAGNOSIS — C787 Secondary malignant neoplasm of liver and intrahepatic bile duct: Secondary | ICD-10-CM | POA: Diagnosis not present

## 2019-10-28 DIAGNOSIS — T451X5D Adverse effect of antineoplastic and immunosuppressive drugs, subsequent encounter: Secondary | ICD-10-CM | POA: Diagnosis not present

## 2019-10-28 DIAGNOSIS — K521 Toxic gastroenteritis and colitis: Secondary | ICD-10-CM | POA: Diagnosis not present

## 2019-10-28 DIAGNOSIS — C8590 Non-Hodgkin lymphoma, unspecified, unspecified site: Secondary | ICD-10-CM | POA: Diagnosis not present

## 2019-10-28 DIAGNOSIS — C61 Malignant neoplasm of prostate: Secondary | ICD-10-CM | POA: Diagnosis not present

## 2019-10-28 DIAGNOSIS — G4733 Obstructive sleep apnea (adult) (pediatric): Secondary | ICD-10-CM | POA: Diagnosis not present

## 2019-10-29 ENCOUNTER — Ambulatory Visit: Payer: Medicare Other | Admitting: Oncology

## 2019-10-29 ENCOUNTER — Other Ambulatory Visit: Payer: Medicare Other

## 2019-10-31 DIAGNOSIS — T451X5D Adverse effect of antineoplastic and immunosuppressive drugs, subsequent encounter: Secondary | ICD-10-CM | POA: Diagnosis not present

## 2019-10-31 DIAGNOSIS — K521 Toxic gastroenteritis and colitis: Secondary | ICD-10-CM | POA: Diagnosis not present

## 2019-10-31 DIAGNOSIS — C787 Secondary malignant neoplasm of liver and intrahepatic bile duct: Secondary | ICD-10-CM | POA: Diagnosis not present

## 2019-10-31 DIAGNOSIS — G4733 Obstructive sleep apnea (adult) (pediatric): Secondary | ICD-10-CM | POA: Diagnosis not present

## 2019-10-31 DIAGNOSIS — C8590 Non-Hodgkin lymphoma, unspecified, unspecified site: Secondary | ICD-10-CM | POA: Diagnosis not present

## 2019-10-31 DIAGNOSIS — C61 Malignant neoplasm of prostate: Secondary | ICD-10-CM | POA: Diagnosis not present

## 2019-11-05 DIAGNOSIS — C787 Secondary malignant neoplasm of liver and intrahepatic bile duct: Secondary | ICD-10-CM | POA: Diagnosis not present

## 2019-11-05 DIAGNOSIS — C8590 Non-Hodgkin lymphoma, unspecified, unspecified site: Secondary | ICD-10-CM | POA: Diagnosis not present

## 2019-11-05 DIAGNOSIS — G4733 Obstructive sleep apnea (adult) (pediatric): Secondary | ICD-10-CM | POA: Diagnosis not present

## 2019-11-05 DIAGNOSIS — T451X5D Adverse effect of antineoplastic and immunosuppressive drugs, subsequent encounter: Secondary | ICD-10-CM | POA: Diagnosis not present

## 2019-11-05 DIAGNOSIS — C61 Malignant neoplasm of prostate: Secondary | ICD-10-CM | POA: Diagnosis not present

## 2019-11-05 DIAGNOSIS — K521 Toxic gastroenteritis and colitis: Secondary | ICD-10-CM | POA: Diagnosis not present

## 2019-11-11 DIAGNOSIS — T451X5D Adverse effect of antineoplastic and immunosuppressive drugs, subsequent encounter: Secondary | ICD-10-CM | POA: Diagnosis not present

## 2019-11-11 DIAGNOSIS — C61 Malignant neoplasm of prostate: Secondary | ICD-10-CM | POA: Diagnosis not present

## 2019-11-11 DIAGNOSIS — G4733 Obstructive sleep apnea (adult) (pediatric): Secondary | ICD-10-CM | POA: Diagnosis not present

## 2019-11-11 DIAGNOSIS — K521 Toxic gastroenteritis and colitis: Secondary | ICD-10-CM | POA: Diagnosis not present

## 2019-11-11 DIAGNOSIS — C8590 Non-Hodgkin lymphoma, unspecified, unspecified site: Secondary | ICD-10-CM | POA: Diagnosis not present

## 2019-11-11 DIAGNOSIS — C787 Secondary malignant neoplasm of liver and intrahepatic bile duct: Secondary | ICD-10-CM | POA: Diagnosis not present

## 2019-11-18 DIAGNOSIS — K521 Toxic gastroenteritis and colitis: Secondary | ICD-10-CM | POA: Diagnosis not present

## 2019-11-18 DIAGNOSIS — C8590 Non-Hodgkin lymphoma, unspecified, unspecified site: Secondary | ICD-10-CM | POA: Diagnosis not present

## 2019-11-18 DIAGNOSIS — C61 Malignant neoplasm of prostate: Secondary | ICD-10-CM | POA: Diagnosis not present

## 2019-11-18 DIAGNOSIS — T451X5D Adverse effect of antineoplastic and immunosuppressive drugs, subsequent encounter: Secondary | ICD-10-CM | POA: Diagnosis not present

## 2019-11-18 DIAGNOSIS — C787 Secondary malignant neoplasm of liver and intrahepatic bile duct: Secondary | ICD-10-CM | POA: Diagnosis not present

## 2019-11-18 DIAGNOSIS — G4733 Obstructive sleep apnea (adult) (pediatric): Secondary | ICD-10-CM | POA: Diagnosis not present

## 2019-11-24 DIAGNOSIS — C787 Secondary malignant neoplasm of liver and intrahepatic bile duct: Secondary | ICD-10-CM | POA: Diagnosis not present

## 2019-11-24 DIAGNOSIS — N390 Urinary tract infection, site not specified: Secondary | ICD-10-CM | POA: Diagnosis not present

## 2019-11-24 DIAGNOSIS — E538 Deficiency of other specified B group vitamins: Secondary | ICD-10-CM | POA: Diagnosis not present

## 2019-11-24 DIAGNOSIS — R296 Repeated falls: Secondary | ICD-10-CM | POA: Diagnosis not present

## 2019-11-24 DIAGNOSIS — C8590 Non-Hodgkin lymphoma, unspecified, unspecified site: Secondary | ICD-10-CM | POA: Diagnosis not present

## 2019-11-24 DIAGNOSIS — Z8709 Personal history of other diseases of the respiratory system: Secondary | ICD-10-CM | POA: Diagnosis not present

## 2019-11-24 DIAGNOSIS — I2510 Atherosclerotic heart disease of native coronary artery without angina pectoris: Secondary | ICD-10-CM | POA: Diagnosis not present

## 2019-11-24 DIAGNOSIS — E1122 Type 2 diabetes mellitus with diabetic chronic kidney disease: Secondary | ICD-10-CM | POA: Diagnosis not present

## 2019-11-24 DIAGNOSIS — I129 Hypertensive chronic kidney disease with stage 1 through stage 4 chronic kidney disease, or unspecified chronic kidney disease: Secondary | ICD-10-CM | POA: Diagnosis not present

## 2019-11-24 DIAGNOSIS — Z741 Need for assistance with personal care: Secondary | ICD-10-CM | POA: Diagnosis not present

## 2019-11-24 DIAGNOSIS — Z9989 Dependence on other enabling machines and devices: Secondary | ICD-10-CM | POA: Diagnosis not present

## 2019-11-24 DIAGNOSIS — R634 Abnormal weight loss: Secondary | ICD-10-CM | POA: Diagnosis not present

## 2019-11-24 DIAGNOSIS — K521 Toxic gastroenteritis and colitis: Secondary | ICD-10-CM | POA: Diagnosis not present

## 2019-11-24 DIAGNOSIS — Z8616 Personal history of COVID-19: Secondary | ICD-10-CM | POA: Diagnosis not present

## 2019-11-24 DIAGNOSIS — R627 Adult failure to thrive: Secondary | ICD-10-CM | POA: Diagnosis not present

## 2019-11-24 DIAGNOSIS — T451X5D Adverse effect of antineoplastic and immunosuppressive drugs, subsequent encounter: Secondary | ICD-10-CM | POA: Diagnosis not present

## 2019-11-24 DIAGNOSIS — E43 Unspecified severe protein-calorie malnutrition: Secondary | ICD-10-CM | POA: Diagnosis not present

## 2019-11-24 DIAGNOSIS — N184 Chronic kidney disease, stage 4 (severe): Secondary | ICD-10-CM | POA: Diagnosis not present

## 2019-11-24 DIAGNOSIS — A419 Sepsis, unspecified organism: Secondary | ICD-10-CM | POA: Diagnosis not present

## 2019-11-24 DIAGNOSIS — G4733 Obstructive sleep apnea (adult) (pediatric): Secondary | ICD-10-CM | POA: Diagnosis not present

## 2019-11-24 DIAGNOSIS — D630 Anemia in neoplastic disease: Secondary | ICD-10-CM | POA: Diagnosis not present

## 2019-11-24 DIAGNOSIS — L89892 Pressure ulcer of other site, stage 2: Secondary | ICD-10-CM | POA: Diagnosis not present

## 2019-11-24 DIAGNOSIS — C61 Malignant neoplasm of prostate: Secondary | ICD-10-CM | POA: Diagnosis not present

## 2019-11-24 DIAGNOSIS — I4891 Unspecified atrial fibrillation: Secondary | ICD-10-CM | POA: Diagnosis not present

## 2019-11-24 DIAGNOSIS — Z8719 Personal history of other diseases of the digestive system: Secondary | ICD-10-CM | POA: Diagnosis not present

## 2019-11-25 DIAGNOSIS — C787 Secondary malignant neoplasm of liver and intrahepatic bile duct: Secondary | ICD-10-CM | POA: Diagnosis not present

## 2019-11-25 DIAGNOSIS — C8590 Non-Hodgkin lymphoma, unspecified, unspecified site: Secondary | ICD-10-CM | POA: Diagnosis not present

## 2019-11-25 DIAGNOSIS — G4733 Obstructive sleep apnea (adult) (pediatric): Secondary | ICD-10-CM | POA: Diagnosis not present

## 2019-11-25 DIAGNOSIS — K521 Toxic gastroenteritis and colitis: Secondary | ICD-10-CM | POA: Diagnosis not present

## 2019-11-25 DIAGNOSIS — T451X5D Adverse effect of antineoplastic and immunosuppressive drugs, subsequent encounter: Secondary | ICD-10-CM | POA: Diagnosis not present

## 2019-11-25 DIAGNOSIS — C61 Malignant neoplasm of prostate: Secondary | ICD-10-CM | POA: Diagnosis not present

## 2019-12-02 DIAGNOSIS — C8590 Non-Hodgkin lymphoma, unspecified, unspecified site: Secondary | ICD-10-CM | POA: Diagnosis not present

## 2019-12-02 DIAGNOSIS — G4733 Obstructive sleep apnea (adult) (pediatric): Secondary | ICD-10-CM | POA: Diagnosis not present

## 2019-12-02 DIAGNOSIS — C61 Malignant neoplasm of prostate: Secondary | ICD-10-CM | POA: Diagnosis not present

## 2019-12-02 DIAGNOSIS — T451X5D Adverse effect of antineoplastic and immunosuppressive drugs, subsequent encounter: Secondary | ICD-10-CM | POA: Diagnosis not present

## 2019-12-02 DIAGNOSIS — C787 Secondary malignant neoplasm of liver and intrahepatic bile duct: Secondary | ICD-10-CM | POA: Diagnosis not present

## 2019-12-02 DIAGNOSIS — K521 Toxic gastroenteritis and colitis: Secondary | ICD-10-CM | POA: Diagnosis not present

## 2019-12-09 DIAGNOSIS — T451X5D Adverse effect of antineoplastic and immunosuppressive drugs, subsequent encounter: Secondary | ICD-10-CM | POA: Diagnosis not present

## 2019-12-09 DIAGNOSIS — C8590 Non-Hodgkin lymphoma, unspecified, unspecified site: Secondary | ICD-10-CM | POA: Diagnosis not present

## 2019-12-09 DIAGNOSIS — K521 Toxic gastroenteritis and colitis: Secondary | ICD-10-CM | POA: Diagnosis not present

## 2019-12-09 DIAGNOSIS — C787 Secondary malignant neoplasm of liver and intrahepatic bile duct: Secondary | ICD-10-CM | POA: Diagnosis not present

## 2019-12-09 DIAGNOSIS — G4733 Obstructive sleep apnea (adult) (pediatric): Secondary | ICD-10-CM | POA: Diagnosis not present

## 2019-12-09 DIAGNOSIS — C61 Malignant neoplasm of prostate: Secondary | ICD-10-CM | POA: Diagnosis not present

## 2019-12-13 DIAGNOSIS — G4733 Obstructive sleep apnea (adult) (pediatric): Secondary | ICD-10-CM | POA: Diagnosis not present

## 2019-12-13 DIAGNOSIS — C61 Malignant neoplasm of prostate: Secondary | ICD-10-CM | POA: Diagnosis not present

## 2019-12-13 DIAGNOSIS — K521 Toxic gastroenteritis and colitis: Secondary | ICD-10-CM | POA: Diagnosis not present

## 2019-12-13 DIAGNOSIS — T451X5D Adverse effect of antineoplastic and immunosuppressive drugs, subsequent encounter: Secondary | ICD-10-CM | POA: Diagnosis not present

## 2019-12-13 DIAGNOSIS — C787 Secondary malignant neoplasm of liver and intrahepatic bile duct: Secondary | ICD-10-CM | POA: Diagnosis not present

## 2019-12-13 DIAGNOSIS — C8590 Non-Hodgkin lymphoma, unspecified, unspecified site: Secondary | ICD-10-CM | POA: Diagnosis not present

## 2019-12-16 DIAGNOSIS — C787 Secondary malignant neoplasm of liver and intrahepatic bile duct: Secondary | ICD-10-CM | POA: Diagnosis not present

## 2019-12-16 DIAGNOSIS — K521 Toxic gastroenteritis and colitis: Secondary | ICD-10-CM | POA: Diagnosis not present

## 2019-12-16 DIAGNOSIS — C8590 Non-Hodgkin lymphoma, unspecified, unspecified site: Secondary | ICD-10-CM | POA: Diagnosis not present

## 2019-12-16 DIAGNOSIS — G4733 Obstructive sleep apnea (adult) (pediatric): Secondary | ICD-10-CM | POA: Diagnosis not present

## 2019-12-16 DIAGNOSIS — T451X5D Adverse effect of antineoplastic and immunosuppressive drugs, subsequent encounter: Secondary | ICD-10-CM | POA: Diagnosis not present

## 2019-12-16 DIAGNOSIS — C61 Malignant neoplasm of prostate: Secondary | ICD-10-CM | POA: Diagnosis not present

## 2019-12-23 DIAGNOSIS — G4733 Obstructive sleep apnea (adult) (pediatric): Secondary | ICD-10-CM | POA: Diagnosis not present

## 2019-12-23 DIAGNOSIS — C8590 Non-Hodgkin lymphoma, unspecified, unspecified site: Secondary | ICD-10-CM | POA: Diagnosis not present

## 2019-12-23 DIAGNOSIS — C61 Malignant neoplasm of prostate: Secondary | ICD-10-CM | POA: Diagnosis not present

## 2019-12-23 DIAGNOSIS — C787 Secondary malignant neoplasm of liver and intrahepatic bile duct: Secondary | ICD-10-CM | POA: Diagnosis not present

## 2019-12-23 DIAGNOSIS — T451X5D Adverse effect of antineoplastic and immunosuppressive drugs, subsequent encounter: Secondary | ICD-10-CM | POA: Diagnosis not present

## 2019-12-23 DIAGNOSIS — K521 Toxic gastroenteritis and colitis: Secondary | ICD-10-CM | POA: Diagnosis not present

## 2019-12-25 DIAGNOSIS — K521 Toxic gastroenteritis and colitis: Secondary | ICD-10-CM | POA: Diagnosis not present

## 2019-12-25 DIAGNOSIS — G4733 Obstructive sleep apnea (adult) (pediatric): Secondary | ICD-10-CM | POA: Diagnosis not present

## 2019-12-25 DIAGNOSIS — I4891 Unspecified atrial fibrillation: Secondary | ICD-10-CM | POA: Diagnosis not present

## 2019-12-25 DIAGNOSIS — Z9989 Dependence on other enabling machines and devices: Secondary | ICD-10-CM | POA: Diagnosis not present

## 2019-12-25 DIAGNOSIS — Z8709 Personal history of other diseases of the respiratory system: Secondary | ICD-10-CM | POA: Diagnosis not present

## 2019-12-25 DIAGNOSIS — E538 Deficiency of other specified B group vitamins: Secondary | ICD-10-CM | POA: Diagnosis not present

## 2019-12-25 DIAGNOSIS — E43 Unspecified severe protein-calorie malnutrition: Secondary | ICD-10-CM | POA: Diagnosis not present

## 2019-12-25 DIAGNOSIS — I2510 Atherosclerotic heart disease of native coronary artery without angina pectoris: Secondary | ICD-10-CM | POA: Diagnosis not present

## 2019-12-25 DIAGNOSIS — E1122 Type 2 diabetes mellitus with diabetic chronic kidney disease: Secondary | ICD-10-CM | POA: Diagnosis not present

## 2019-12-25 DIAGNOSIS — R627 Adult failure to thrive: Secondary | ICD-10-CM | POA: Diagnosis not present

## 2019-12-25 DIAGNOSIS — I129 Hypertensive chronic kidney disease with stage 1 through stage 4 chronic kidney disease, or unspecified chronic kidney disease: Secondary | ICD-10-CM | POA: Diagnosis not present

## 2019-12-25 DIAGNOSIS — Z8719 Personal history of other diseases of the digestive system: Secondary | ICD-10-CM | POA: Diagnosis not present

## 2019-12-25 DIAGNOSIS — C8590 Non-Hodgkin lymphoma, unspecified, unspecified site: Secondary | ICD-10-CM | POA: Diagnosis not present

## 2019-12-25 DIAGNOSIS — R296 Repeated falls: Secondary | ICD-10-CM | POA: Diagnosis not present

## 2019-12-25 DIAGNOSIS — D630 Anemia in neoplastic disease: Secondary | ICD-10-CM | POA: Diagnosis not present

## 2019-12-25 DIAGNOSIS — L89892 Pressure ulcer of other site, stage 2: Secondary | ICD-10-CM | POA: Diagnosis not present

## 2019-12-25 DIAGNOSIS — Z8616 Personal history of COVID-19: Secondary | ICD-10-CM | POA: Diagnosis not present

## 2019-12-25 DIAGNOSIS — R634 Abnormal weight loss: Secondary | ICD-10-CM | POA: Diagnosis not present

## 2019-12-25 DIAGNOSIS — T451X5D Adverse effect of antineoplastic and immunosuppressive drugs, subsequent encounter: Secondary | ICD-10-CM | POA: Diagnosis not present

## 2019-12-25 DIAGNOSIS — C61 Malignant neoplasm of prostate: Secondary | ICD-10-CM | POA: Diagnosis not present

## 2019-12-25 DIAGNOSIS — A419 Sepsis, unspecified organism: Secondary | ICD-10-CM | POA: Diagnosis not present

## 2019-12-25 DIAGNOSIS — N390 Urinary tract infection, site not specified: Secondary | ICD-10-CM | POA: Diagnosis not present

## 2019-12-25 DIAGNOSIS — N184 Chronic kidney disease, stage 4 (severe): Secondary | ICD-10-CM | POA: Diagnosis not present

## 2019-12-25 DIAGNOSIS — C787 Secondary malignant neoplasm of liver and intrahepatic bile duct: Secondary | ICD-10-CM | POA: Diagnosis not present

## 2019-12-25 DIAGNOSIS — Z741 Need for assistance with personal care: Secondary | ICD-10-CM | POA: Diagnosis not present

## 2020-01-01 DIAGNOSIS — C61 Malignant neoplasm of prostate: Secondary | ICD-10-CM | POA: Diagnosis not present

## 2020-01-01 DIAGNOSIS — T451X5D Adverse effect of antineoplastic and immunosuppressive drugs, subsequent encounter: Secondary | ICD-10-CM | POA: Diagnosis not present

## 2020-01-01 DIAGNOSIS — K521 Toxic gastroenteritis and colitis: Secondary | ICD-10-CM | POA: Diagnosis not present

## 2020-01-01 DIAGNOSIS — C787 Secondary malignant neoplasm of liver and intrahepatic bile duct: Secondary | ICD-10-CM | POA: Diagnosis not present

## 2020-01-01 DIAGNOSIS — C8590 Non-Hodgkin lymphoma, unspecified, unspecified site: Secondary | ICD-10-CM | POA: Diagnosis not present

## 2020-01-01 DIAGNOSIS — G4733 Obstructive sleep apnea (adult) (pediatric): Secondary | ICD-10-CM | POA: Diagnosis not present

## 2020-01-06 DIAGNOSIS — C8590 Non-Hodgkin lymphoma, unspecified, unspecified site: Secondary | ICD-10-CM | POA: Diagnosis not present

## 2020-01-06 DIAGNOSIS — C61 Malignant neoplasm of prostate: Secondary | ICD-10-CM | POA: Diagnosis not present

## 2020-01-06 DIAGNOSIS — K521 Toxic gastroenteritis and colitis: Secondary | ICD-10-CM | POA: Diagnosis not present

## 2020-01-06 DIAGNOSIS — G4733 Obstructive sleep apnea (adult) (pediatric): Secondary | ICD-10-CM | POA: Diagnosis not present

## 2020-01-06 DIAGNOSIS — T451X5D Adverse effect of antineoplastic and immunosuppressive drugs, subsequent encounter: Secondary | ICD-10-CM | POA: Diagnosis not present

## 2020-01-06 DIAGNOSIS — C787 Secondary malignant neoplasm of liver and intrahepatic bile duct: Secondary | ICD-10-CM | POA: Diagnosis not present

## 2020-01-13 DIAGNOSIS — G4733 Obstructive sleep apnea (adult) (pediatric): Secondary | ICD-10-CM | POA: Diagnosis not present

## 2020-01-13 DIAGNOSIS — T451X5D Adverse effect of antineoplastic and immunosuppressive drugs, subsequent encounter: Secondary | ICD-10-CM | POA: Diagnosis not present

## 2020-01-13 DIAGNOSIS — C61 Malignant neoplasm of prostate: Secondary | ICD-10-CM | POA: Diagnosis not present

## 2020-01-13 DIAGNOSIS — K521 Toxic gastroenteritis and colitis: Secondary | ICD-10-CM | POA: Diagnosis not present

## 2020-01-13 DIAGNOSIS — C8590 Non-Hodgkin lymphoma, unspecified, unspecified site: Secondary | ICD-10-CM | POA: Diagnosis not present

## 2020-01-13 DIAGNOSIS — C787 Secondary malignant neoplasm of liver and intrahepatic bile duct: Secondary | ICD-10-CM | POA: Diagnosis not present

## 2020-01-20 DIAGNOSIS — T451X5D Adverse effect of antineoplastic and immunosuppressive drugs, subsequent encounter: Secondary | ICD-10-CM | POA: Diagnosis not present

## 2020-01-20 DIAGNOSIS — G4733 Obstructive sleep apnea (adult) (pediatric): Secondary | ICD-10-CM | POA: Diagnosis not present

## 2020-01-20 DIAGNOSIS — C787 Secondary malignant neoplasm of liver and intrahepatic bile duct: Secondary | ICD-10-CM | POA: Diagnosis not present

## 2020-01-20 DIAGNOSIS — C61 Malignant neoplasm of prostate: Secondary | ICD-10-CM | POA: Diagnosis not present

## 2020-01-20 DIAGNOSIS — K521 Toxic gastroenteritis and colitis: Secondary | ICD-10-CM | POA: Diagnosis not present

## 2020-01-20 DIAGNOSIS — C8590 Non-Hodgkin lymphoma, unspecified, unspecified site: Secondary | ICD-10-CM | POA: Diagnosis not present

## 2020-01-24 DIAGNOSIS — I2510 Atherosclerotic heart disease of native coronary artery without angina pectoris: Secondary | ICD-10-CM | POA: Diagnosis not present

## 2020-01-24 DIAGNOSIS — D630 Anemia in neoplastic disease: Secondary | ICD-10-CM | POA: Diagnosis not present

## 2020-01-24 DIAGNOSIS — Z741 Need for assistance with personal care: Secondary | ICD-10-CM | POA: Diagnosis not present

## 2020-01-24 DIAGNOSIS — C787 Secondary malignant neoplasm of liver and intrahepatic bile duct: Secondary | ICD-10-CM | POA: Diagnosis not present

## 2020-01-24 DIAGNOSIS — R627 Adult failure to thrive: Secondary | ICD-10-CM | POA: Diagnosis not present

## 2020-01-24 DIAGNOSIS — N184 Chronic kidney disease, stage 4 (severe): Secondary | ICD-10-CM | POA: Diagnosis not present

## 2020-01-24 DIAGNOSIS — E538 Deficiency of other specified B group vitamins: Secondary | ICD-10-CM | POA: Diagnosis not present

## 2020-01-24 DIAGNOSIS — E43 Unspecified severe protein-calorie malnutrition: Secondary | ICD-10-CM | POA: Diagnosis not present

## 2020-01-24 DIAGNOSIS — C8590 Non-Hodgkin lymphoma, unspecified, unspecified site: Secondary | ICD-10-CM | POA: Diagnosis not present

## 2020-01-24 DIAGNOSIS — E1122 Type 2 diabetes mellitus with diabetic chronic kidney disease: Secondary | ICD-10-CM | POA: Diagnosis not present

## 2020-01-24 DIAGNOSIS — C61 Malignant neoplasm of prostate: Secondary | ICD-10-CM | POA: Diagnosis not present

## 2020-01-24 DIAGNOSIS — G4733 Obstructive sleep apnea (adult) (pediatric): Secondary | ICD-10-CM | POA: Diagnosis not present

## 2020-01-24 DIAGNOSIS — K521 Toxic gastroenteritis and colitis: Secondary | ICD-10-CM | POA: Diagnosis not present

## 2020-01-24 DIAGNOSIS — R634 Abnormal weight loss: Secondary | ICD-10-CM | POA: Diagnosis not present

## 2020-01-24 DIAGNOSIS — Z8719 Personal history of other diseases of the digestive system: Secondary | ICD-10-CM | POA: Diagnosis not present

## 2020-01-24 DIAGNOSIS — N390 Urinary tract infection, site not specified: Secondary | ICD-10-CM | POA: Diagnosis not present

## 2020-01-24 DIAGNOSIS — Z8709 Personal history of other diseases of the respiratory system: Secondary | ICD-10-CM | POA: Diagnosis not present

## 2020-01-24 DIAGNOSIS — A419 Sepsis, unspecified organism: Secondary | ICD-10-CM | POA: Diagnosis not present

## 2020-01-24 DIAGNOSIS — I129 Hypertensive chronic kidney disease with stage 1 through stage 4 chronic kidney disease, or unspecified chronic kidney disease: Secondary | ICD-10-CM | POA: Diagnosis not present

## 2020-01-24 DIAGNOSIS — Z9989 Dependence on other enabling machines and devices: Secondary | ICD-10-CM | POA: Diagnosis not present

## 2020-01-24 DIAGNOSIS — L89892 Pressure ulcer of other site, stage 2: Secondary | ICD-10-CM | POA: Diagnosis not present

## 2020-01-24 DIAGNOSIS — T451X5D Adverse effect of antineoplastic and immunosuppressive drugs, subsequent encounter: Secondary | ICD-10-CM | POA: Diagnosis not present

## 2020-01-24 DIAGNOSIS — I4891 Unspecified atrial fibrillation: Secondary | ICD-10-CM | POA: Diagnosis not present

## 2020-01-24 DIAGNOSIS — R296 Repeated falls: Secondary | ICD-10-CM | POA: Diagnosis not present

## 2020-01-24 DIAGNOSIS — Z8616 Personal history of COVID-19: Secondary | ICD-10-CM | POA: Diagnosis not present

## 2020-01-27 DIAGNOSIS — C787 Secondary malignant neoplasm of liver and intrahepatic bile duct: Secondary | ICD-10-CM | POA: Diagnosis not present

## 2020-01-27 DIAGNOSIS — T451X5D Adverse effect of antineoplastic and immunosuppressive drugs, subsequent encounter: Secondary | ICD-10-CM | POA: Diagnosis not present

## 2020-01-27 DIAGNOSIS — G4733 Obstructive sleep apnea (adult) (pediatric): Secondary | ICD-10-CM | POA: Diagnosis not present

## 2020-01-27 DIAGNOSIS — K521 Toxic gastroenteritis and colitis: Secondary | ICD-10-CM | POA: Diagnosis not present

## 2020-01-27 DIAGNOSIS — C61 Malignant neoplasm of prostate: Secondary | ICD-10-CM | POA: Diagnosis not present

## 2020-01-27 DIAGNOSIS — C8590 Non-Hodgkin lymphoma, unspecified, unspecified site: Secondary | ICD-10-CM | POA: Diagnosis not present

## 2020-02-03 DIAGNOSIS — T451X5D Adverse effect of antineoplastic and immunosuppressive drugs, subsequent encounter: Secondary | ICD-10-CM | POA: Diagnosis not present

## 2020-02-03 DIAGNOSIS — K521 Toxic gastroenteritis and colitis: Secondary | ICD-10-CM | POA: Diagnosis not present

## 2020-02-03 DIAGNOSIS — G4733 Obstructive sleep apnea (adult) (pediatric): Secondary | ICD-10-CM | POA: Diagnosis not present

## 2020-02-03 DIAGNOSIS — C8590 Non-Hodgkin lymphoma, unspecified, unspecified site: Secondary | ICD-10-CM | POA: Diagnosis not present

## 2020-02-03 DIAGNOSIS — C787 Secondary malignant neoplasm of liver and intrahepatic bile duct: Secondary | ICD-10-CM | POA: Diagnosis not present

## 2020-02-03 DIAGNOSIS — C61 Malignant neoplasm of prostate: Secondary | ICD-10-CM | POA: Diagnosis not present

## 2020-02-05 ENCOUNTER — Other Ambulatory Visit: Payer: Self-pay | Admitting: Nurse Practitioner

## 2020-02-11 DIAGNOSIS — T451X5D Adverse effect of antineoplastic and immunosuppressive drugs, subsequent encounter: Secondary | ICD-10-CM | POA: Diagnosis not present

## 2020-02-11 DIAGNOSIS — C787 Secondary malignant neoplasm of liver and intrahepatic bile duct: Secondary | ICD-10-CM | POA: Diagnosis not present

## 2020-02-11 DIAGNOSIS — G4733 Obstructive sleep apnea (adult) (pediatric): Secondary | ICD-10-CM | POA: Diagnosis not present

## 2020-02-11 DIAGNOSIS — C8590 Non-Hodgkin lymphoma, unspecified, unspecified site: Secondary | ICD-10-CM | POA: Diagnosis not present

## 2020-02-11 DIAGNOSIS — K521 Toxic gastroenteritis and colitis: Secondary | ICD-10-CM | POA: Diagnosis not present

## 2020-02-11 DIAGNOSIS — C61 Malignant neoplasm of prostate: Secondary | ICD-10-CM | POA: Diagnosis not present

## 2020-02-17 DIAGNOSIS — K521 Toxic gastroenteritis and colitis: Secondary | ICD-10-CM | POA: Diagnosis not present

## 2020-02-17 DIAGNOSIS — T451X5D Adverse effect of antineoplastic and immunosuppressive drugs, subsequent encounter: Secondary | ICD-10-CM | POA: Diagnosis not present

## 2020-02-17 DIAGNOSIS — C8590 Non-Hodgkin lymphoma, unspecified, unspecified site: Secondary | ICD-10-CM | POA: Diagnosis not present

## 2020-02-17 DIAGNOSIS — G4733 Obstructive sleep apnea (adult) (pediatric): Secondary | ICD-10-CM | POA: Diagnosis not present

## 2020-02-17 DIAGNOSIS — C787 Secondary malignant neoplasm of liver and intrahepatic bile duct: Secondary | ICD-10-CM | POA: Diagnosis not present

## 2020-02-17 DIAGNOSIS — C61 Malignant neoplasm of prostate: Secondary | ICD-10-CM | POA: Diagnosis not present

## 2020-02-24 DIAGNOSIS — C8590 Non-Hodgkin lymphoma, unspecified, unspecified site: Secondary | ICD-10-CM | POA: Diagnosis not present

## 2020-02-24 DIAGNOSIS — A419 Sepsis, unspecified organism: Secondary | ICD-10-CM | POA: Diagnosis not present

## 2020-02-24 DIAGNOSIS — N184 Chronic kidney disease, stage 4 (severe): Secondary | ICD-10-CM | POA: Diagnosis not present

## 2020-02-24 DIAGNOSIS — I2510 Atherosclerotic heart disease of native coronary artery without angina pectoris: Secondary | ICD-10-CM | POA: Diagnosis not present

## 2020-02-24 DIAGNOSIS — I4891 Unspecified atrial fibrillation: Secondary | ICD-10-CM | POA: Diagnosis not present

## 2020-02-24 DIAGNOSIS — E538 Deficiency of other specified B group vitamins: Secondary | ICD-10-CM | POA: Diagnosis not present

## 2020-02-24 DIAGNOSIS — Z8616 Personal history of COVID-19: Secondary | ICD-10-CM | POA: Diagnosis not present

## 2020-02-24 DIAGNOSIS — G4733 Obstructive sleep apnea (adult) (pediatric): Secondary | ICD-10-CM | POA: Diagnosis not present

## 2020-02-24 DIAGNOSIS — Z9989 Dependence on other enabling machines and devices: Secondary | ICD-10-CM | POA: Diagnosis not present

## 2020-02-24 DIAGNOSIS — N390 Urinary tract infection, site not specified: Secondary | ICD-10-CM | POA: Diagnosis not present

## 2020-02-24 DIAGNOSIS — K521 Toxic gastroenteritis and colitis: Secondary | ICD-10-CM | POA: Diagnosis not present

## 2020-02-24 DIAGNOSIS — E1122 Type 2 diabetes mellitus with diabetic chronic kidney disease: Secondary | ICD-10-CM | POA: Diagnosis not present

## 2020-02-24 DIAGNOSIS — T451X5D Adverse effect of antineoplastic and immunosuppressive drugs, subsequent encounter: Secondary | ICD-10-CM | POA: Diagnosis not present

## 2020-02-24 DIAGNOSIS — Z741 Need for assistance with personal care: Secondary | ICD-10-CM | POA: Diagnosis not present

## 2020-02-24 DIAGNOSIS — R627 Adult failure to thrive: Secondary | ICD-10-CM | POA: Diagnosis not present

## 2020-02-24 DIAGNOSIS — R634 Abnormal weight loss: Secondary | ICD-10-CM | POA: Diagnosis not present

## 2020-02-24 DIAGNOSIS — E43 Unspecified severe protein-calorie malnutrition: Secondary | ICD-10-CM | POA: Diagnosis not present

## 2020-02-24 DIAGNOSIS — Z8709 Personal history of other diseases of the respiratory system: Secondary | ICD-10-CM | POA: Diagnosis not present

## 2020-02-24 DIAGNOSIS — C787 Secondary malignant neoplasm of liver and intrahepatic bile duct: Secondary | ICD-10-CM | POA: Diagnosis not present

## 2020-02-24 DIAGNOSIS — R296 Repeated falls: Secondary | ICD-10-CM | POA: Diagnosis not present

## 2020-02-24 DIAGNOSIS — D630 Anemia in neoplastic disease: Secondary | ICD-10-CM | POA: Diagnosis not present

## 2020-02-24 DIAGNOSIS — I129 Hypertensive chronic kidney disease with stage 1 through stage 4 chronic kidney disease, or unspecified chronic kidney disease: Secondary | ICD-10-CM | POA: Diagnosis not present

## 2020-02-24 DIAGNOSIS — C61 Malignant neoplasm of prostate: Secondary | ICD-10-CM | POA: Diagnosis not present

## 2020-02-24 DIAGNOSIS — Z8719 Personal history of other diseases of the digestive system: Secondary | ICD-10-CM | POA: Diagnosis not present

## 2020-02-24 DIAGNOSIS — L89892 Pressure ulcer of other site, stage 2: Secondary | ICD-10-CM | POA: Diagnosis not present

## 2020-02-26 DIAGNOSIS — G4733 Obstructive sleep apnea (adult) (pediatric): Secondary | ICD-10-CM | POA: Diagnosis not present

## 2020-02-26 DIAGNOSIS — T451X5D Adverse effect of antineoplastic and immunosuppressive drugs, subsequent encounter: Secondary | ICD-10-CM | POA: Diagnosis not present

## 2020-02-26 DIAGNOSIS — C787 Secondary malignant neoplasm of liver and intrahepatic bile duct: Secondary | ICD-10-CM | POA: Diagnosis not present

## 2020-02-26 DIAGNOSIS — C8590 Non-Hodgkin lymphoma, unspecified, unspecified site: Secondary | ICD-10-CM | POA: Diagnosis not present

## 2020-02-26 DIAGNOSIS — K521 Toxic gastroenteritis and colitis: Secondary | ICD-10-CM | POA: Diagnosis not present

## 2020-02-26 DIAGNOSIS — C61 Malignant neoplasm of prostate: Secondary | ICD-10-CM | POA: Diagnosis not present

## 2020-02-28 DIAGNOSIS — T451X5D Adverse effect of antineoplastic and immunosuppressive drugs, subsequent encounter: Secondary | ICD-10-CM | POA: Diagnosis not present

## 2020-02-28 DIAGNOSIS — C61 Malignant neoplasm of prostate: Secondary | ICD-10-CM | POA: Diagnosis not present

## 2020-02-28 DIAGNOSIS — K521 Toxic gastroenteritis and colitis: Secondary | ICD-10-CM | POA: Diagnosis not present

## 2020-02-28 DIAGNOSIS — C8590 Non-Hodgkin lymphoma, unspecified, unspecified site: Secondary | ICD-10-CM | POA: Diagnosis not present

## 2020-02-28 DIAGNOSIS — G4733 Obstructive sleep apnea (adult) (pediatric): Secondary | ICD-10-CM | POA: Diagnosis not present

## 2020-02-28 DIAGNOSIS — C787 Secondary malignant neoplasm of liver and intrahepatic bile duct: Secondary | ICD-10-CM | POA: Diagnosis not present

## 2020-03-02 DIAGNOSIS — K521 Toxic gastroenteritis and colitis: Secondary | ICD-10-CM | POA: Diagnosis not present

## 2020-03-02 DIAGNOSIS — T451X5D Adverse effect of antineoplastic and immunosuppressive drugs, subsequent encounter: Secondary | ICD-10-CM | POA: Diagnosis not present

## 2020-03-02 DIAGNOSIS — C8590 Non-Hodgkin lymphoma, unspecified, unspecified site: Secondary | ICD-10-CM | POA: Diagnosis not present

## 2020-03-02 DIAGNOSIS — C787 Secondary malignant neoplasm of liver and intrahepatic bile duct: Secondary | ICD-10-CM | POA: Diagnosis not present

## 2020-03-02 DIAGNOSIS — G4733 Obstructive sleep apnea (adult) (pediatric): Secondary | ICD-10-CM | POA: Diagnosis not present

## 2020-03-02 DIAGNOSIS — C61 Malignant neoplasm of prostate: Secondary | ICD-10-CM | POA: Diagnosis not present

## 2020-03-04 DIAGNOSIS — C8590 Non-Hodgkin lymphoma, unspecified, unspecified site: Secondary | ICD-10-CM | POA: Diagnosis not present

## 2020-03-04 DIAGNOSIS — C61 Malignant neoplasm of prostate: Secondary | ICD-10-CM | POA: Diagnosis not present

## 2020-03-04 DIAGNOSIS — G4733 Obstructive sleep apnea (adult) (pediatric): Secondary | ICD-10-CM | POA: Diagnosis not present

## 2020-03-04 DIAGNOSIS — K521 Toxic gastroenteritis and colitis: Secondary | ICD-10-CM | POA: Diagnosis not present

## 2020-03-04 DIAGNOSIS — C787 Secondary malignant neoplasm of liver and intrahepatic bile duct: Secondary | ICD-10-CM | POA: Diagnosis not present

## 2020-03-04 DIAGNOSIS — T451X5D Adverse effect of antineoplastic and immunosuppressive drugs, subsequent encounter: Secondary | ICD-10-CM | POA: Diagnosis not present

## 2020-03-05 DIAGNOSIS — T451X5D Adverse effect of antineoplastic and immunosuppressive drugs, subsequent encounter: Secondary | ICD-10-CM | POA: Diagnosis not present

## 2020-03-05 DIAGNOSIS — C787 Secondary malignant neoplasm of liver and intrahepatic bile duct: Secondary | ICD-10-CM | POA: Diagnosis not present

## 2020-03-05 DIAGNOSIS — G4733 Obstructive sleep apnea (adult) (pediatric): Secondary | ICD-10-CM | POA: Diagnosis not present

## 2020-03-05 DIAGNOSIS — K521 Toxic gastroenteritis and colitis: Secondary | ICD-10-CM | POA: Diagnosis not present

## 2020-03-05 DIAGNOSIS — C61 Malignant neoplasm of prostate: Secondary | ICD-10-CM | POA: Diagnosis not present

## 2020-03-05 DIAGNOSIS — C8590 Non-Hodgkin lymphoma, unspecified, unspecified site: Secondary | ICD-10-CM | POA: Diagnosis not present

## 2020-03-06 DIAGNOSIS — K521 Toxic gastroenteritis and colitis: Secondary | ICD-10-CM | POA: Diagnosis not present

## 2020-03-06 DIAGNOSIS — T451X5D Adverse effect of antineoplastic and immunosuppressive drugs, subsequent encounter: Secondary | ICD-10-CM | POA: Diagnosis not present

## 2020-03-06 DIAGNOSIS — C61 Malignant neoplasm of prostate: Secondary | ICD-10-CM | POA: Diagnosis not present

## 2020-03-06 DIAGNOSIS — C787 Secondary malignant neoplasm of liver and intrahepatic bile duct: Secondary | ICD-10-CM | POA: Diagnosis not present

## 2020-03-06 DIAGNOSIS — G4733 Obstructive sleep apnea (adult) (pediatric): Secondary | ICD-10-CM | POA: Diagnosis not present

## 2020-03-06 DIAGNOSIS — C8590 Non-Hodgkin lymphoma, unspecified, unspecified site: Secondary | ICD-10-CM | POA: Diagnosis not present

## 2020-03-09 DIAGNOSIS — G4733 Obstructive sleep apnea (adult) (pediatric): Secondary | ICD-10-CM | POA: Diagnosis not present

## 2020-03-09 DIAGNOSIS — K521 Toxic gastroenteritis and colitis: Secondary | ICD-10-CM | POA: Diagnosis not present

## 2020-03-09 DIAGNOSIS — C787 Secondary malignant neoplasm of liver and intrahepatic bile duct: Secondary | ICD-10-CM | POA: Diagnosis not present

## 2020-03-09 DIAGNOSIS — C8590 Non-Hodgkin lymphoma, unspecified, unspecified site: Secondary | ICD-10-CM | POA: Diagnosis not present

## 2020-03-09 DIAGNOSIS — T451X5D Adverse effect of antineoplastic and immunosuppressive drugs, subsequent encounter: Secondary | ICD-10-CM | POA: Diagnosis not present

## 2020-03-09 DIAGNOSIS — C61 Malignant neoplasm of prostate: Secondary | ICD-10-CM | POA: Diagnosis not present

## 2020-03-11 DIAGNOSIS — C61 Malignant neoplasm of prostate: Secondary | ICD-10-CM | POA: Diagnosis not present

## 2020-03-11 DIAGNOSIS — G4733 Obstructive sleep apnea (adult) (pediatric): Secondary | ICD-10-CM | POA: Diagnosis not present

## 2020-03-11 DIAGNOSIS — T451X5D Adverse effect of antineoplastic and immunosuppressive drugs, subsequent encounter: Secondary | ICD-10-CM | POA: Diagnosis not present

## 2020-03-11 DIAGNOSIS — K521 Toxic gastroenteritis and colitis: Secondary | ICD-10-CM | POA: Diagnosis not present

## 2020-03-11 DIAGNOSIS — C8590 Non-Hodgkin lymphoma, unspecified, unspecified site: Secondary | ICD-10-CM | POA: Diagnosis not present

## 2020-03-11 DIAGNOSIS — C787 Secondary malignant neoplasm of liver and intrahepatic bile duct: Secondary | ICD-10-CM | POA: Diagnosis not present

## 2020-03-13 DIAGNOSIS — T451X5D Adverse effect of antineoplastic and immunosuppressive drugs, subsequent encounter: Secondary | ICD-10-CM | POA: Diagnosis not present

## 2020-03-13 DIAGNOSIS — C8590 Non-Hodgkin lymphoma, unspecified, unspecified site: Secondary | ICD-10-CM | POA: Diagnosis not present

## 2020-03-13 DIAGNOSIS — K521 Toxic gastroenteritis and colitis: Secondary | ICD-10-CM | POA: Diagnosis not present

## 2020-03-13 DIAGNOSIS — C787 Secondary malignant neoplasm of liver and intrahepatic bile duct: Secondary | ICD-10-CM | POA: Diagnosis not present

## 2020-03-13 DIAGNOSIS — G4733 Obstructive sleep apnea (adult) (pediatric): Secondary | ICD-10-CM | POA: Diagnosis not present

## 2020-03-13 DIAGNOSIS — C61 Malignant neoplasm of prostate: Secondary | ICD-10-CM | POA: Diagnosis not present

## 2020-03-16 DIAGNOSIS — G4733 Obstructive sleep apnea (adult) (pediatric): Secondary | ICD-10-CM | POA: Diagnosis not present

## 2020-03-16 DIAGNOSIS — K521 Toxic gastroenteritis and colitis: Secondary | ICD-10-CM | POA: Diagnosis not present

## 2020-03-16 DIAGNOSIS — T451X5D Adverse effect of antineoplastic and immunosuppressive drugs, subsequent encounter: Secondary | ICD-10-CM | POA: Diagnosis not present

## 2020-03-16 DIAGNOSIS — C61 Malignant neoplasm of prostate: Secondary | ICD-10-CM | POA: Diagnosis not present

## 2020-03-16 DIAGNOSIS — C787 Secondary malignant neoplasm of liver and intrahepatic bile duct: Secondary | ICD-10-CM | POA: Diagnosis not present

## 2020-03-16 DIAGNOSIS — C8590 Non-Hodgkin lymphoma, unspecified, unspecified site: Secondary | ICD-10-CM | POA: Diagnosis not present

## 2020-03-18 DIAGNOSIS — C8590 Non-Hodgkin lymphoma, unspecified, unspecified site: Secondary | ICD-10-CM | POA: Diagnosis not present

## 2020-03-18 DIAGNOSIS — G4733 Obstructive sleep apnea (adult) (pediatric): Secondary | ICD-10-CM | POA: Diagnosis not present

## 2020-03-18 DIAGNOSIS — K521 Toxic gastroenteritis and colitis: Secondary | ICD-10-CM | POA: Diagnosis not present

## 2020-03-18 DIAGNOSIS — C787 Secondary malignant neoplasm of liver and intrahepatic bile duct: Secondary | ICD-10-CM | POA: Diagnosis not present

## 2020-03-18 DIAGNOSIS — T451X5D Adverse effect of antineoplastic and immunosuppressive drugs, subsequent encounter: Secondary | ICD-10-CM | POA: Diagnosis not present

## 2020-03-18 DIAGNOSIS — C61 Malignant neoplasm of prostate: Secondary | ICD-10-CM | POA: Diagnosis not present

## 2020-03-23 DIAGNOSIS — G4733 Obstructive sleep apnea (adult) (pediatric): Secondary | ICD-10-CM | POA: Diagnosis not present

## 2020-03-23 DIAGNOSIS — C61 Malignant neoplasm of prostate: Secondary | ICD-10-CM | POA: Diagnosis not present

## 2020-03-23 DIAGNOSIS — C787 Secondary malignant neoplasm of liver and intrahepatic bile duct: Secondary | ICD-10-CM | POA: Diagnosis not present

## 2020-03-23 DIAGNOSIS — T451X5D Adverse effect of antineoplastic and immunosuppressive drugs, subsequent encounter: Secondary | ICD-10-CM | POA: Diagnosis not present

## 2020-03-23 DIAGNOSIS — K521 Toxic gastroenteritis and colitis: Secondary | ICD-10-CM | POA: Diagnosis not present

## 2020-03-23 DIAGNOSIS — C8590 Non-Hodgkin lymphoma, unspecified, unspecified site: Secondary | ICD-10-CM | POA: Diagnosis not present

## 2020-03-25 DIAGNOSIS — Z9989 Dependence on other enabling machines and devices: Secondary | ICD-10-CM | POA: Diagnosis not present

## 2020-03-25 DIAGNOSIS — A419 Sepsis, unspecified organism: Secondary | ICD-10-CM | POA: Diagnosis not present

## 2020-03-25 DIAGNOSIS — R634 Abnormal weight loss: Secondary | ICD-10-CM | POA: Diagnosis not present

## 2020-03-25 DIAGNOSIS — C61 Malignant neoplasm of prostate: Secondary | ICD-10-CM | POA: Diagnosis not present

## 2020-03-25 DIAGNOSIS — C8590 Non-Hodgkin lymphoma, unspecified, unspecified site: Secondary | ICD-10-CM | POA: Diagnosis not present

## 2020-03-25 DIAGNOSIS — Z8709 Personal history of other diseases of the respiratory system: Secondary | ICD-10-CM | POA: Diagnosis not present

## 2020-03-25 DIAGNOSIS — Z741 Need for assistance with personal care: Secondary | ICD-10-CM | POA: Diagnosis not present

## 2020-03-25 DIAGNOSIS — L89892 Pressure ulcer of other site, stage 2: Secondary | ICD-10-CM | POA: Diagnosis not present

## 2020-03-25 DIAGNOSIS — N390 Urinary tract infection, site not specified: Secondary | ICD-10-CM | POA: Diagnosis not present

## 2020-03-25 DIAGNOSIS — N184 Chronic kidney disease, stage 4 (severe): Secondary | ICD-10-CM | POA: Diagnosis not present

## 2020-03-25 DIAGNOSIS — I129 Hypertensive chronic kidney disease with stage 1 through stage 4 chronic kidney disease, or unspecified chronic kidney disease: Secondary | ICD-10-CM | POA: Diagnosis not present

## 2020-03-25 DIAGNOSIS — E43 Unspecified severe protein-calorie malnutrition: Secondary | ICD-10-CM | POA: Diagnosis not present

## 2020-03-25 DIAGNOSIS — D630 Anemia in neoplastic disease: Secondary | ICD-10-CM | POA: Diagnosis not present

## 2020-03-25 DIAGNOSIS — G4733 Obstructive sleep apnea (adult) (pediatric): Secondary | ICD-10-CM | POA: Diagnosis not present

## 2020-03-25 DIAGNOSIS — T451X5D Adverse effect of antineoplastic and immunosuppressive drugs, subsequent encounter: Secondary | ICD-10-CM | POA: Diagnosis not present

## 2020-03-25 DIAGNOSIS — R319 Hematuria, unspecified: Secondary | ICD-10-CM | POA: Diagnosis not present

## 2020-03-25 DIAGNOSIS — Z9981 Dependence on supplemental oxygen: Secondary | ICD-10-CM | POA: Diagnosis not present

## 2020-03-25 DIAGNOSIS — R627 Adult failure to thrive: Secondary | ICD-10-CM | POA: Diagnosis not present

## 2020-03-25 DIAGNOSIS — I4891 Unspecified atrial fibrillation: Secondary | ICD-10-CM | POA: Diagnosis not present

## 2020-03-25 DIAGNOSIS — R296 Repeated falls: Secondary | ICD-10-CM | POA: Diagnosis not present

## 2020-03-25 DIAGNOSIS — C787 Secondary malignant neoplasm of liver and intrahepatic bile duct: Secondary | ICD-10-CM | POA: Diagnosis not present

## 2020-03-25 DIAGNOSIS — E1122 Type 2 diabetes mellitus with diabetic chronic kidney disease: Secondary | ICD-10-CM | POA: Diagnosis not present

## 2020-03-25 DIAGNOSIS — I2510 Atherosclerotic heart disease of native coronary artery without angina pectoris: Secondary | ICD-10-CM | POA: Diagnosis not present

## 2020-03-25 DIAGNOSIS — E538 Deficiency of other specified B group vitamins: Secondary | ICD-10-CM | POA: Diagnosis not present

## 2020-03-25 DIAGNOSIS — K521 Toxic gastroenteritis and colitis: Secondary | ICD-10-CM | POA: Diagnosis not present

## 2020-03-27 DIAGNOSIS — G4733 Obstructive sleep apnea (adult) (pediatric): Secondary | ICD-10-CM | POA: Diagnosis not present

## 2020-03-27 DIAGNOSIS — K521 Toxic gastroenteritis and colitis: Secondary | ICD-10-CM | POA: Diagnosis not present

## 2020-03-27 DIAGNOSIS — C8590 Non-Hodgkin lymphoma, unspecified, unspecified site: Secondary | ICD-10-CM | POA: Diagnosis not present

## 2020-03-27 DIAGNOSIS — C61 Malignant neoplasm of prostate: Secondary | ICD-10-CM | POA: Diagnosis not present

## 2020-03-27 DIAGNOSIS — T451X5D Adverse effect of antineoplastic and immunosuppressive drugs, subsequent encounter: Secondary | ICD-10-CM | POA: Diagnosis not present

## 2020-03-27 DIAGNOSIS — C787 Secondary malignant neoplasm of liver and intrahepatic bile duct: Secondary | ICD-10-CM | POA: Diagnosis not present

## 2020-03-28 DIAGNOSIS — K521 Toxic gastroenteritis and colitis: Secondary | ICD-10-CM | POA: Diagnosis not present

## 2020-03-28 DIAGNOSIS — T451X5D Adverse effect of antineoplastic and immunosuppressive drugs, subsequent encounter: Secondary | ICD-10-CM | POA: Diagnosis not present

## 2020-03-28 DIAGNOSIS — C61 Malignant neoplasm of prostate: Secondary | ICD-10-CM | POA: Diagnosis not present

## 2020-03-28 DIAGNOSIS — G4733 Obstructive sleep apnea (adult) (pediatric): Secondary | ICD-10-CM | POA: Diagnosis not present

## 2020-03-28 DIAGNOSIS — C787 Secondary malignant neoplasm of liver and intrahepatic bile duct: Secondary | ICD-10-CM | POA: Diagnosis not present

## 2020-03-28 DIAGNOSIS — C8590 Non-Hodgkin lymphoma, unspecified, unspecified site: Secondary | ICD-10-CM | POA: Diagnosis not present

## 2020-03-30 DIAGNOSIS — T451X5D Adverse effect of antineoplastic and immunosuppressive drugs, subsequent encounter: Secondary | ICD-10-CM | POA: Diagnosis not present

## 2020-03-30 DIAGNOSIS — C787 Secondary malignant neoplasm of liver and intrahepatic bile duct: Secondary | ICD-10-CM | POA: Diagnosis not present

## 2020-03-30 DIAGNOSIS — G4733 Obstructive sleep apnea (adult) (pediatric): Secondary | ICD-10-CM | POA: Diagnosis not present

## 2020-03-30 DIAGNOSIS — K521 Toxic gastroenteritis and colitis: Secondary | ICD-10-CM | POA: Diagnosis not present

## 2020-03-30 DIAGNOSIS — C61 Malignant neoplasm of prostate: Secondary | ICD-10-CM | POA: Diagnosis not present

## 2020-03-30 DIAGNOSIS — C8590 Non-Hodgkin lymphoma, unspecified, unspecified site: Secondary | ICD-10-CM | POA: Diagnosis not present

## 2020-04-01 DIAGNOSIS — T451X5D Adverse effect of antineoplastic and immunosuppressive drugs, subsequent encounter: Secondary | ICD-10-CM | POA: Diagnosis not present

## 2020-04-01 DIAGNOSIS — G4733 Obstructive sleep apnea (adult) (pediatric): Secondary | ICD-10-CM | POA: Diagnosis not present

## 2020-04-01 DIAGNOSIS — C8590 Non-Hodgkin lymphoma, unspecified, unspecified site: Secondary | ICD-10-CM | POA: Diagnosis not present

## 2020-04-01 DIAGNOSIS — C61 Malignant neoplasm of prostate: Secondary | ICD-10-CM | POA: Diagnosis not present

## 2020-04-01 DIAGNOSIS — C787 Secondary malignant neoplasm of liver and intrahepatic bile duct: Secondary | ICD-10-CM | POA: Diagnosis not present

## 2020-04-01 DIAGNOSIS — K521 Toxic gastroenteritis and colitis: Secondary | ICD-10-CM | POA: Diagnosis not present

## 2020-04-03 DIAGNOSIS — C61 Malignant neoplasm of prostate: Secondary | ICD-10-CM | POA: Diagnosis not present

## 2020-04-03 DIAGNOSIS — K521 Toxic gastroenteritis and colitis: Secondary | ICD-10-CM | POA: Diagnosis not present

## 2020-04-03 DIAGNOSIS — C787 Secondary malignant neoplasm of liver and intrahepatic bile duct: Secondary | ICD-10-CM | POA: Diagnosis not present

## 2020-04-03 DIAGNOSIS — C8590 Non-Hodgkin lymphoma, unspecified, unspecified site: Secondary | ICD-10-CM | POA: Diagnosis not present

## 2020-04-03 DIAGNOSIS — G4733 Obstructive sleep apnea (adult) (pediatric): Secondary | ICD-10-CM | POA: Diagnosis not present

## 2020-04-03 DIAGNOSIS — T451X5D Adverse effect of antineoplastic and immunosuppressive drugs, subsequent encounter: Secondary | ICD-10-CM | POA: Diagnosis not present

## 2020-04-06 DIAGNOSIS — K521 Toxic gastroenteritis and colitis: Secondary | ICD-10-CM | POA: Diagnosis not present

## 2020-04-06 DIAGNOSIS — C8590 Non-Hodgkin lymphoma, unspecified, unspecified site: Secondary | ICD-10-CM | POA: Diagnosis not present

## 2020-04-06 DIAGNOSIS — C787 Secondary malignant neoplasm of liver and intrahepatic bile duct: Secondary | ICD-10-CM | POA: Diagnosis not present

## 2020-04-06 DIAGNOSIS — C61 Malignant neoplasm of prostate: Secondary | ICD-10-CM | POA: Diagnosis not present

## 2020-04-06 DIAGNOSIS — G4733 Obstructive sleep apnea (adult) (pediatric): Secondary | ICD-10-CM | POA: Diagnosis not present

## 2020-04-06 DIAGNOSIS — T451X5D Adverse effect of antineoplastic and immunosuppressive drugs, subsequent encounter: Secondary | ICD-10-CM | POA: Diagnosis not present

## 2020-04-08 DIAGNOSIS — G4733 Obstructive sleep apnea (adult) (pediatric): Secondary | ICD-10-CM | POA: Diagnosis not present

## 2020-04-08 DIAGNOSIS — K521 Toxic gastroenteritis and colitis: Secondary | ICD-10-CM | POA: Diagnosis not present

## 2020-04-08 DIAGNOSIS — C8590 Non-Hodgkin lymphoma, unspecified, unspecified site: Secondary | ICD-10-CM | POA: Diagnosis not present

## 2020-04-08 DIAGNOSIS — C787 Secondary malignant neoplasm of liver and intrahepatic bile duct: Secondary | ICD-10-CM | POA: Diagnosis not present

## 2020-04-08 DIAGNOSIS — C61 Malignant neoplasm of prostate: Secondary | ICD-10-CM | POA: Diagnosis not present

## 2020-04-08 DIAGNOSIS — T451X5D Adverse effect of antineoplastic and immunosuppressive drugs, subsequent encounter: Secondary | ICD-10-CM | POA: Diagnosis not present

## 2020-04-10 DIAGNOSIS — C8590 Non-Hodgkin lymphoma, unspecified, unspecified site: Secondary | ICD-10-CM | POA: Diagnosis not present

## 2020-04-10 DIAGNOSIS — K521 Toxic gastroenteritis and colitis: Secondary | ICD-10-CM | POA: Diagnosis not present

## 2020-04-10 DIAGNOSIS — T451X5D Adverse effect of antineoplastic and immunosuppressive drugs, subsequent encounter: Secondary | ICD-10-CM | POA: Diagnosis not present

## 2020-04-10 DIAGNOSIS — C787 Secondary malignant neoplasm of liver and intrahepatic bile duct: Secondary | ICD-10-CM | POA: Diagnosis not present

## 2020-04-10 DIAGNOSIS — C61 Malignant neoplasm of prostate: Secondary | ICD-10-CM | POA: Diagnosis not present

## 2020-04-10 DIAGNOSIS — G4733 Obstructive sleep apnea (adult) (pediatric): Secondary | ICD-10-CM | POA: Diagnosis not present

## 2020-04-11 DIAGNOSIS — T451X5D Adverse effect of antineoplastic and immunosuppressive drugs, subsequent encounter: Secondary | ICD-10-CM | POA: Diagnosis not present

## 2020-04-11 DIAGNOSIS — C787 Secondary malignant neoplasm of liver and intrahepatic bile duct: Secondary | ICD-10-CM | POA: Diagnosis not present

## 2020-04-11 DIAGNOSIS — C61 Malignant neoplasm of prostate: Secondary | ICD-10-CM | POA: Diagnosis not present

## 2020-04-11 DIAGNOSIS — C8590 Non-Hodgkin lymphoma, unspecified, unspecified site: Secondary | ICD-10-CM | POA: Diagnosis not present

## 2020-04-11 DIAGNOSIS — G4733 Obstructive sleep apnea (adult) (pediatric): Secondary | ICD-10-CM | POA: Diagnosis not present

## 2020-04-11 DIAGNOSIS — K521 Toxic gastroenteritis and colitis: Secondary | ICD-10-CM | POA: Diagnosis not present

## 2020-04-12 DIAGNOSIS — K521 Toxic gastroenteritis and colitis: Secondary | ICD-10-CM | POA: Diagnosis not present

## 2020-04-12 DIAGNOSIS — G4733 Obstructive sleep apnea (adult) (pediatric): Secondary | ICD-10-CM | POA: Diagnosis not present

## 2020-04-12 DIAGNOSIS — T451X5D Adverse effect of antineoplastic and immunosuppressive drugs, subsequent encounter: Secondary | ICD-10-CM | POA: Diagnosis not present

## 2020-04-12 DIAGNOSIS — C61 Malignant neoplasm of prostate: Secondary | ICD-10-CM | POA: Diagnosis not present

## 2020-04-12 DIAGNOSIS — C8590 Non-Hodgkin lymphoma, unspecified, unspecified site: Secondary | ICD-10-CM | POA: Diagnosis not present

## 2020-04-12 DIAGNOSIS — C787 Secondary malignant neoplasm of liver and intrahepatic bile duct: Secondary | ICD-10-CM | POA: Diagnosis not present

## 2020-04-13 DIAGNOSIS — K521 Toxic gastroenteritis and colitis: Secondary | ICD-10-CM | POA: Diagnosis not present

## 2020-04-13 DIAGNOSIS — C61 Malignant neoplasm of prostate: Secondary | ICD-10-CM | POA: Diagnosis not present

## 2020-04-13 DIAGNOSIS — T451X5D Adverse effect of antineoplastic and immunosuppressive drugs, subsequent encounter: Secondary | ICD-10-CM | POA: Diagnosis not present

## 2020-04-13 DIAGNOSIS — C787 Secondary malignant neoplasm of liver and intrahepatic bile duct: Secondary | ICD-10-CM | POA: Diagnosis not present

## 2020-04-13 DIAGNOSIS — C8590 Non-Hodgkin lymphoma, unspecified, unspecified site: Secondary | ICD-10-CM | POA: Diagnosis not present

## 2020-04-13 DIAGNOSIS — G4733 Obstructive sleep apnea (adult) (pediatric): Secondary | ICD-10-CM | POA: Diagnosis not present

## 2020-04-25 DEATH — deceased
